# Patient Record
Sex: Female | Born: 1960 | Race: Black or African American | Hispanic: No | Smoking: Never smoker
Health system: Southern US, Community
[De-identification: ages and names within clinical notes are randomized; demographics above are authoritative.]

## PROBLEM LIST (undated history)

## (undated) ENCOUNTER — Emergency Department (HOSPITAL_COMMUNITY): Payer: Medicaid Other

## (undated) DIAGNOSIS — R413 Other amnesia: Secondary | ICD-10-CM

## (undated) DIAGNOSIS — G4730 Sleep apnea, unspecified: Secondary | ICD-10-CM

## (undated) DIAGNOSIS — J189 Pneumonia, unspecified organism: Secondary | ICD-10-CM

## (undated) DIAGNOSIS — D649 Anemia, unspecified: Secondary | ICD-10-CM

## (undated) DIAGNOSIS — I639 Cerebral infarction, unspecified: Secondary | ICD-10-CM

## (undated) DIAGNOSIS — I219 Acute myocardial infarction, unspecified: Secondary | ICD-10-CM

## (undated) DIAGNOSIS — Z992 Dependence on renal dialysis: Secondary | ICD-10-CM

## (undated) DIAGNOSIS — I10 Essential (primary) hypertension: Secondary | ICD-10-CM

## (undated) DIAGNOSIS — T884XXA Failed or difficult intubation, initial encounter: Secondary | ICD-10-CM

## (undated) DIAGNOSIS — I2510 Atherosclerotic heart disease of native coronary artery without angina pectoris: Secondary | ICD-10-CM

## (undated) DIAGNOSIS — I509 Heart failure, unspecified: Secondary | ICD-10-CM

## (undated) DIAGNOSIS — M1990 Unspecified osteoarthritis, unspecified site: Secondary | ICD-10-CM

## (undated) DIAGNOSIS — N186 End stage renal disease: Secondary | ICD-10-CM

## (undated) DIAGNOSIS — K635 Polyp of colon: Secondary | ICD-10-CM

## (undated) DIAGNOSIS — R0600 Dyspnea, unspecified: Secondary | ICD-10-CM

## (undated) DIAGNOSIS — E119 Type 2 diabetes mellitus without complications: Secondary | ICD-10-CM

## (undated) DIAGNOSIS — N289 Disorder of kidney and ureter, unspecified: Secondary | ICD-10-CM

## (undated) DIAGNOSIS — R011 Cardiac murmur, unspecified: Secondary | ICD-10-CM

## (undated) DIAGNOSIS — F32A Depression, unspecified: Secondary | ICD-10-CM

## (undated) HISTORY — DX: Pneumonia, unspecified organism: J18.9

## (undated) HISTORY — DX: Atherosclerotic heart disease of native coronary artery without angina pectoris: I25.10

## (undated) HISTORY — DX: Anemia, unspecified: D64.9

## (undated) HISTORY — DX: Sleep apnea, unspecified: G47.30

## (undated) HISTORY — DX: Cerebral infarction, unspecified: I63.9

## (undated) HISTORY — DX: Polyp of colon: K63.5

## (undated) HISTORY — DX: Unspecified osteoarthritis, unspecified site: M19.90

## (undated) HISTORY — PX: ABDOMINAL HYSTERECTOMY: SHX81

## (undated) HISTORY — DX: Depression, unspecified: F32.A

## (undated) HISTORY — PX: INNER EAR SURGERY: SHX679

## (undated) HISTORY — PX: TONSILLECTOMY: SUR1361

---

## 2012-10-16 DIAGNOSIS — I5030 Unspecified diastolic (congestive) heart failure: Secondary | ICD-10-CM | POA: Insufficient documentation

## 2012-10-16 DIAGNOSIS — I5032 Chronic diastolic (congestive) heart failure: Secondary | ICD-10-CM | POA: Diagnosis present

## 2012-10-16 DIAGNOSIS — I10 Essential (primary) hypertension: Secondary | ICD-10-CM | POA: Insufficient documentation

## 2013-04-11 DIAGNOSIS — G629 Polyneuropathy, unspecified: Secondary | ICD-10-CM | POA: Insufficient documentation

## 2013-07-07 DIAGNOSIS — I2510 Atherosclerotic heart disease of native coronary artery without angina pectoris: Secondary | ICD-10-CM | POA: Insufficient documentation

## 2014-02-23 DIAGNOSIS — G4733 Obstructive sleep apnea (adult) (pediatric): Secondary | ICD-10-CM | POA: Insufficient documentation

## 2014-05-17 DIAGNOSIS — Z531 Procedure and treatment not carried out because of patient's decision for reasons of belief and group pressure: Secondary | ICD-10-CM | POA: Insufficient documentation

## 2014-05-17 DIAGNOSIS — Z789 Other specified health status: Secondary | ICD-10-CM | POA: Insufficient documentation

## 2014-05-17 DIAGNOSIS — IMO0001 Reserved for inherently not codable concepts without codable children: Secondary | ICD-10-CM | POA: Insufficient documentation

## 2015-02-15 DIAGNOSIS — Z794 Long term (current) use of insulin: Secondary | ICD-10-CM | POA: Insufficient documentation

## 2015-02-15 DIAGNOSIS — E1142 Type 2 diabetes mellitus with diabetic polyneuropathy: Secondary | ICD-10-CM | POA: Insufficient documentation

## 2015-04-30 DIAGNOSIS — N1832 Chronic kidney disease, stage 3b: Secondary | ICD-10-CM | POA: Insufficient documentation

## 2015-08-21 DIAGNOSIS — M545 Low back pain, unspecified: Secondary | ICD-10-CM | POA: Insufficient documentation

## 2018-03-03 DIAGNOSIS — H900 Conductive hearing loss, bilateral: Secondary | ICD-10-CM | POA: Insufficient documentation

## 2018-10-02 DIAGNOSIS — M1A9XX1 Chronic gout, unspecified, with tophus (tophi): Secondary | ICD-10-CM | POA: Insufficient documentation

## 2019-02-03 DIAGNOSIS — E113513 Type 2 diabetes mellitus with proliferative diabetic retinopathy with macular edema, bilateral: Secondary | ICD-10-CM | POA: Insufficient documentation

## 2019-04-28 DIAGNOSIS — I5033 Acute on chronic diastolic (congestive) heart failure: Secondary | ICD-10-CM | POA: Diagnosis not present

## 2019-04-28 DIAGNOSIS — I130 Hypertensive heart and chronic kidney disease with heart failure and stage 1 through stage 4 chronic kidney disease, or unspecified chronic kidney disease: Secondary | ICD-10-CM | POA: Diagnosis not present

## 2019-04-28 DIAGNOSIS — N179 Acute kidney failure, unspecified: Secondary | ICD-10-CM | POA: Diagnosis not present

## 2019-04-28 DIAGNOSIS — I5032 Chronic diastolic (congestive) heart failure: Secondary | ICD-10-CM | POA: Diagnosis not present

## 2019-04-28 DIAGNOSIS — E1142 Type 2 diabetes mellitus with diabetic polyneuropathy: Secondary | ICD-10-CM | POA: Diagnosis not present

## 2019-04-28 DIAGNOSIS — Z6841 Body Mass Index (BMI) 40.0 and over, adult: Secondary | ICD-10-CM | POA: Diagnosis not present

## 2019-04-28 DIAGNOSIS — J9601 Acute respiratory failure with hypoxia: Secondary | ICD-10-CM | POA: Diagnosis not present

## 2019-04-28 DIAGNOSIS — R001 Bradycardia, unspecified: Secondary | ICD-10-CM | POA: Diagnosis not present

## 2019-04-28 DIAGNOSIS — K5900 Constipation, unspecified: Secondary | ICD-10-CM | POA: Diagnosis not present

## 2019-04-28 DIAGNOSIS — D509 Iron deficiency anemia, unspecified: Secondary | ICD-10-CM | POA: Diagnosis not present

## 2019-04-28 DIAGNOSIS — E785 Hyperlipidemia, unspecified: Secondary | ICD-10-CM | POA: Diagnosis not present

## 2019-04-28 DIAGNOSIS — N183 Chronic kidney disease, stage 3 unspecified: Secondary | ICD-10-CM | POA: Diagnosis not present

## 2019-04-28 DIAGNOSIS — Z20828 Contact with and (suspected) exposure to other viral communicable diseases: Secondary | ICD-10-CM | POA: Diagnosis not present

## 2019-04-28 DIAGNOSIS — F329 Major depressive disorder, single episode, unspecified: Secondary | ICD-10-CM | POA: Diagnosis not present

## 2019-04-28 DIAGNOSIS — D631 Anemia in chronic kidney disease: Secondary | ICD-10-CM | POA: Diagnosis not present

## 2019-04-28 DIAGNOSIS — Z7982 Long term (current) use of aspirin: Secondary | ICD-10-CM | POA: Diagnosis not present

## 2019-04-28 DIAGNOSIS — I272 Pulmonary hypertension, unspecified: Secondary | ICD-10-CM | POA: Diagnosis not present

## 2019-04-28 DIAGNOSIS — I2510 Atherosclerotic heart disease of native coronary artery without angina pectoris: Secondary | ICD-10-CM | POA: Diagnosis not present

## 2019-04-28 DIAGNOSIS — E1122 Type 2 diabetes mellitus with diabetic chronic kidney disease: Secondary | ICD-10-CM | POA: Diagnosis not present

## 2019-04-28 DIAGNOSIS — G4733 Obstructive sleep apnea (adult) (pediatric): Secondary | ICD-10-CM | POA: Diagnosis not present

## 2019-04-28 DIAGNOSIS — F5104 Psychophysiologic insomnia: Secondary | ICD-10-CM | POA: Diagnosis not present

## 2019-04-28 DIAGNOSIS — E7800 Pure hypercholesterolemia, unspecified: Secondary | ICD-10-CM | POA: Diagnosis not present

## 2019-04-28 DIAGNOSIS — M1A9XX0 Chronic gout, unspecified, without tophus (tophi): Secondary | ICD-10-CM | POA: Diagnosis not present

## 2019-04-28 DIAGNOSIS — Z8701 Personal history of pneumonia (recurrent): Secondary | ICD-10-CM | POA: Diagnosis not present

## 2019-04-28 DIAGNOSIS — Z794 Long term (current) use of insulin: Secondary | ICD-10-CM | POA: Diagnosis not present

## 2019-07-24 DIAGNOSIS — D509 Iron deficiency anemia, unspecified: Secondary | ICD-10-CM | POA: Diagnosis not present

## 2019-07-24 DIAGNOSIS — I2510 Atherosclerotic heart disease of native coronary artery without angina pectoris: Secondary | ICD-10-CM | POA: Diagnosis not present

## 2019-07-24 DIAGNOSIS — Z79899 Other long term (current) drug therapy: Secondary | ICD-10-CM | POA: Diagnosis not present

## 2019-07-24 DIAGNOSIS — E11649 Type 2 diabetes mellitus with hypoglycemia without coma: Secondary | ICD-10-CM | POA: Diagnosis not present

## 2019-07-24 DIAGNOSIS — M1A09X0 Idiopathic chronic gout, multiple sites, without tophus (tophi): Secondary | ICD-10-CM | POA: Diagnosis not present

## 2019-07-24 DIAGNOSIS — E662 Morbid (severe) obesity with alveolar hypoventilation: Secondary | ICD-10-CM | POA: Diagnosis not present

## 2019-07-24 DIAGNOSIS — F329 Major depressive disorder, single episode, unspecified: Secondary | ICD-10-CM | POA: Diagnosis not present

## 2019-07-24 DIAGNOSIS — Z531 Procedure and treatment not carried out because of patient's decision for reasons of belief and group pressure: Secondary | ICD-10-CM | POA: Diagnosis not present

## 2019-07-24 DIAGNOSIS — N183 Chronic kidney disease, stage 3 unspecified: Secondary | ICD-10-CM | POA: Diagnosis not present

## 2019-07-24 DIAGNOSIS — R0789 Other chest pain: Secondary | ICD-10-CM | POA: Diagnosis not present

## 2019-07-24 DIAGNOSIS — E7800 Pure hypercholesterolemia, unspecified: Secondary | ICD-10-CM | POA: Diagnosis not present

## 2019-07-24 DIAGNOSIS — K5909 Other constipation: Secondary | ICD-10-CM | POA: Diagnosis not present

## 2019-07-24 DIAGNOSIS — Z20822 Contact with and (suspected) exposure to covid-19: Secondary | ICD-10-CM | POA: Diagnosis not present

## 2019-07-24 DIAGNOSIS — J189 Pneumonia, unspecified organism: Secondary | ICD-10-CM | POA: Diagnosis not present

## 2019-07-24 DIAGNOSIS — I272 Pulmonary hypertension, unspecified: Secondary | ICD-10-CM | POA: Diagnosis not present

## 2019-07-24 DIAGNOSIS — Z9981 Dependence on supplemental oxygen: Secondary | ICD-10-CM | POA: Diagnosis not present

## 2019-07-24 DIAGNOSIS — Z6841 Body Mass Index (BMI) 40.0 and over, adult: Secondary | ICD-10-CM | POA: Diagnosis not present

## 2019-07-24 DIAGNOSIS — E1122 Type 2 diabetes mellitus with diabetic chronic kidney disease: Secondary | ICD-10-CM | POA: Diagnosis not present

## 2019-07-24 DIAGNOSIS — Z87728 Personal history of other specified (corrected) congenital malformations of nervous system and sense organs: Secondary | ICD-10-CM | POA: Diagnosis not present

## 2019-07-24 DIAGNOSIS — I5033 Acute on chronic diastolic (congestive) heart failure: Secondary | ICD-10-CM | POA: Diagnosis not present

## 2019-07-24 DIAGNOSIS — I252 Old myocardial infarction: Secondary | ICD-10-CM | POA: Diagnosis not present

## 2019-07-24 DIAGNOSIS — E1142 Type 2 diabetes mellitus with diabetic polyneuropathy: Secondary | ICD-10-CM | POA: Diagnosis not present

## 2019-07-24 DIAGNOSIS — Z8673 Personal history of transient ischemic attack (TIA), and cerebral infarction without residual deficits: Secondary | ICD-10-CM | POA: Diagnosis not present

## 2019-07-24 DIAGNOSIS — I130 Hypertensive heart and chronic kidney disease with heart failure and stage 1 through stage 4 chronic kidney disease, or unspecified chronic kidney disease: Secondary | ICD-10-CM | POA: Diagnosis not present

## 2019-07-24 DIAGNOSIS — J9621 Acute and chronic respiratory failure with hypoxia: Secondary | ICD-10-CM | POA: Diagnosis not present

## 2019-07-24 DIAGNOSIS — Z794 Long term (current) use of insulin: Secondary | ICD-10-CM | POA: Diagnosis not present

## 2019-07-26 DIAGNOSIS — E1140 Type 2 diabetes mellitus with diabetic neuropathy, unspecified: Secondary | ICD-10-CM | POA: Insufficient documentation

## 2019-10-05 DIAGNOSIS — Z23 Encounter for immunization: Secondary | ICD-10-CM | POA: Diagnosis not present

## 2019-11-02 DIAGNOSIS — Z23 Encounter for immunization: Secondary | ICD-10-CM | POA: Diagnosis not present

## 2019-11-14 DIAGNOSIS — J9601 Acute respiratory failure with hypoxia: Secondary | ICD-10-CM | POA: Diagnosis not present

## 2019-11-30 DIAGNOSIS — I10 Essential (primary) hypertension: Secondary | ICD-10-CM | POA: Diagnosis not present

## 2019-11-30 DIAGNOSIS — N183 Chronic kidney disease, stage 3 unspecified: Secondary | ICD-10-CM | POA: Diagnosis not present

## 2019-11-30 DIAGNOSIS — E790 Hyperuricemia without signs of inflammatory arthritis and tophaceous disease: Secondary | ICD-10-CM | POA: Diagnosis not present

## 2019-12-04 DIAGNOSIS — H4311 Vitreous hemorrhage, right eye: Secondary | ICD-10-CM | POA: Insufficient documentation

## 2019-12-04 DIAGNOSIS — E113513 Type 2 diabetes mellitus with proliferative diabetic retinopathy with macular edema, bilateral: Secondary | ICD-10-CM | POA: Diagnosis not present

## 2019-12-06 DIAGNOSIS — E113513 Type 2 diabetes mellitus with proliferative diabetic retinopathy with macular edema, bilateral: Secondary | ICD-10-CM | POA: Diagnosis not present

## 2019-12-07 DIAGNOSIS — I25118 Atherosclerotic heart disease of native coronary artery with other forms of angina pectoris: Secondary | ICD-10-CM | POA: Diagnosis not present

## 2019-12-07 DIAGNOSIS — G4733 Obstructive sleep apnea (adult) (pediatric): Secondary | ICD-10-CM | POA: Diagnosis not present

## 2019-12-07 DIAGNOSIS — I5032 Chronic diastolic (congestive) heart failure: Secondary | ICD-10-CM | POA: Diagnosis not present

## 2019-12-07 DIAGNOSIS — I10 Essential (primary) hypertension: Secondary | ICD-10-CM | POA: Diagnosis not present

## 2019-12-07 DIAGNOSIS — N183 Chronic kidney disease, stage 3 unspecified: Secondary | ICD-10-CM | POA: Diagnosis not present

## 2019-12-07 DIAGNOSIS — I2510 Atherosclerotic heart disease of native coronary artery without angina pectoris: Secondary | ICD-10-CM | POA: Diagnosis not present

## 2019-12-13 DIAGNOSIS — E1165 Type 2 diabetes mellitus with hyperglycemia: Secondary | ICD-10-CM | POA: Diagnosis not present

## 2019-12-13 DIAGNOSIS — L668 Other cicatricial alopecia: Secondary | ICD-10-CM | POA: Diagnosis not present

## 2019-12-13 DIAGNOSIS — E662 Morbid (severe) obesity with alveolar hypoventilation: Secondary | ICD-10-CM | POA: Diagnosis not present

## 2019-12-13 DIAGNOSIS — Z20822 Contact with and (suspected) exposure to covid-19: Secondary | ICD-10-CM | POA: Diagnosis not present

## 2019-12-13 DIAGNOSIS — I517 Cardiomegaly: Secondary | ICD-10-CM | POA: Diagnosis not present

## 2019-12-13 DIAGNOSIS — J811 Chronic pulmonary edema: Secondary | ICD-10-CM | POA: Diagnosis not present

## 2019-12-13 DIAGNOSIS — F5104 Psychophysiologic insomnia: Secondary | ICD-10-CM | POA: Diagnosis not present

## 2019-12-13 DIAGNOSIS — L299 Pruritus, unspecified: Secondary | ICD-10-CM | POA: Diagnosis not present

## 2019-12-13 DIAGNOSIS — I10 Essential (primary) hypertension: Secondary | ICD-10-CM | POA: Diagnosis not present

## 2019-12-13 DIAGNOSIS — Z6841 Body Mass Index (BMI) 40.0 and over, adult: Secondary | ICD-10-CM | POA: Diagnosis not present

## 2019-12-13 DIAGNOSIS — N183 Chronic kidney disease, stage 3 unspecified: Secondary | ICD-10-CM | POA: Diagnosis not present

## 2019-12-13 DIAGNOSIS — N184 Chronic kidney disease, stage 4 (severe): Secondary | ICD-10-CM | POA: Diagnosis not present

## 2019-12-13 DIAGNOSIS — R0789 Other chest pain: Secondary | ICD-10-CM | POA: Diagnosis not present

## 2019-12-13 DIAGNOSIS — E8881 Metabolic syndrome: Secondary | ICD-10-CM | POA: Diagnosis not present

## 2019-12-13 DIAGNOSIS — R079 Chest pain, unspecified: Secondary | ICD-10-CM | POA: Diagnosis not present

## 2019-12-13 DIAGNOSIS — D509 Iron deficiency anemia, unspecified: Secondary | ICD-10-CM | POA: Diagnosis not present

## 2019-12-13 DIAGNOSIS — M1A9XX1 Chronic gout, unspecified, with tophus (tophi): Secondary | ICD-10-CM | POA: Diagnosis not present

## 2019-12-13 DIAGNOSIS — R0602 Shortness of breath: Secondary | ICD-10-CM | POA: Diagnosis not present

## 2019-12-13 DIAGNOSIS — I130 Hypertensive heart and chronic kidney disease with heart failure and stage 1 through stage 4 chronic kidney disease, or unspecified chronic kidney disease: Secondary | ICD-10-CM | POA: Diagnosis not present

## 2019-12-13 DIAGNOSIS — E7800 Pure hypercholesterolemia, unspecified: Secondary | ICD-10-CM | POA: Diagnosis not present

## 2019-12-13 DIAGNOSIS — I5033 Acute on chronic diastolic (congestive) heart failure: Secondary | ICD-10-CM | POA: Diagnosis not present

## 2019-12-13 DIAGNOSIS — I2510 Atherosclerotic heart disease of native coronary artery without angina pectoris: Secondary | ICD-10-CM | POA: Diagnosis not present

## 2019-12-13 DIAGNOSIS — E1122 Type 2 diabetes mellitus with diabetic chronic kidney disease: Secondary | ICD-10-CM | POA: Diagnosis not present

## 2019-12-13 DIAGNOSIS — I493 Ventricular premature depolarization: Secondary | ICD-10-CM | POA: Diagnosis not present

## 2019-12-13 DIAGNOSIS — F329 Major depressive disorder, single episode, unspecified: Secondary | ICD-10-CM | POA: Diagnosis not present

## 2019-12-13 DIAGNOSIS — E1142 Type 2 diabetes mellitus with diabetic polyneuropathy: Secondary | ICD-10-CM | POA: Diagnosis not present

## 2019-12-13 DIAGNOSIS — E113513 Type 2 diabetes mellitus with proliferative diabetic retinopathy with macular edema, bilateral: Secondary | ICD-10-CM | POA: Diagnosis not present

## 2019-12-13 DIAGNOSIS — G8929 Other chronic pain: Secondary | ICD-10-CM | POA: Diagnosis not present

## 2019-12-13 DIAGNOSIS — K5900 Constipation, unspecified: Secondary | ICD-10-CM | POA: Diagnosis not present

## 2019-12-13 DIAGNOSIS — E785 Hyperlipidemia, unspecified: Secondary | ICD-10-CM | POA: Diagnosis not present

## 2019-12-13 DIAGNOSIS — I440 Atrioventricular block, first degree: Secondary | ICD-10-CM | POA: Diagnosis not present

## 2019-12-13 DIAGNOSIS — R9431 Abnormal electrocardiogram [ECG] [EKG]: Secondary | ICD-10-CM | POA: Diagnosis not present

## 2019-12-13 DIAGNOSIS — M545 Low back pain: Secondary | ICD-10-CM | POA: Diagnosis not present

## 2019-12-13 DIAGNOSIS — F419 Anxiety disorder, unspecified: Secondary | ICD-10-CM | POA: Diagnosis not present

## 2019-12-13 DIAGNOSIS — E11649 Type 2 diabetes mellitus with hypoglycemia without coma: Secondary | ICD-10-CM | POA: Diagnosis not present

## 2019-12-14 DIAGNOSIS — D509 Iron deficiency anemia, unspecified: Secondary | ICD-10-CM | POA: Insufficient documentation

## 2019-12-14 DIAGNOSIS — L905 Scar conditions and fibrosis of skin: Secondary | ICD-10-CM | POA: Diagnosis not present

## 2019-12-15 DIAGNOSIS — I10 Essential (primary) hypertension: Secondary | ICD-10-CM | POA: Diagnosis not present

## 2019-12-15 DIAGNOSIS — D509 Iron deficiency anemia, unspecified: Secondary | ICD-10-CM | POA: Diagnosis not present

## 2019-12-15 DIAGNOSIS — N183 Chronic kidney disease, stage 3 unspecified: Secondary | ICD-10-CM | POA: Diagnosis not present

## 2019-12-15 DIAGNOSIS — I5033 Acute on chronic diastolic (congestive) heart failure: Secondary | ICD-10-CM | POA: Diagnosis not present

## 2019-12-16 DIAGNOSIS — I10 Essential (primary) hypertension: Secondary | ICD-10-CM | POA: Diagnosis not present

## 2019-12-16 DIAGNOSIS — I5033 Acute on chronic diastolic (congestive) heart failure: Secondary | ICD-10-CM | POA: Diagnosis not present

## 2019-12-16 DIAGNOSIS — N183 Chronic kidney disease, stage 3 unspecified: Secondary | ICD-10-CM | POA: Diagnosis not present

## 2019-12-16 DIAGNOSIS — D509 Iron deficiency anemia, unspecified: Secondary | ICD-10-CM | POA: Diagnosis not present

## 2019-12-20 DIAGNOSIS — G4733 Obstructive sleep apnea (adult) (pediatric): Secondary | ICD-10-CM | POA: Diagnosis not present

## 2019-12-20 DIAGNOSIS — G4737 Central sleep apnea in conditions classified elsewhere: Secondary | ICD-10-CM | POA: Diagnosis not present

## 2019-12-21 DIAGNOSIS — E1169 Type 2 diabetes mellitus with other specified complication: Secondary | ICD-10-CM | POA: Diagnosis not present

## 2019-12-21 DIAGNOSIS — I25118 Atherosclerotic heart disease of native coronary artery with other forms of angina pectoris: Secondary | ICD-10-CM | POA: Diagnosis not present

## 2019-12-21 DIAGNOSIS — I10 Essential (primary) hypertension: Secondary | ICD-10-CM | POA: Diagnosis not present

## 2019-12-21 DIAGNOSIS — I5032 Chronic diastolic (congestive) heart failure: Secondary | ICD-10-CM | POA: Diagnosis not present

## 2019-12-22 DIAGNOSIS — I25118 Atherosclerotic heart disease of native coronary artery with other forms of angina pectoris: Secondary | ICD-10-CM | POA: Diagnosis not present

## 2019-12-22 DIAGNOSIS — I5032 Chronic diastolic (congestive) heart failure: Secondary | ICD-10-CM | POA: Diagnosis not present

## 2019-12-22 DIAGNOSIS — I10 Essential (primary) hypertension: Secondary | ICD-10-CM | POA: Diagnosis not present

## 2019-12-22 DIAGNOSIS — N184 Chronic kidney disease, stage 4 (severe): Secondary | ICD-10-CM | POA: Diagnosis not present

## 2020-01-05 DIAGNOSIS — I10 Essential (primary) hypertension: Secondary | ICD-10-CM | POA: Diagnosis not present

## 2020-01-11 DIAGNOSIS — G4733 Obstructive sleep apnea (adult) (pediatric): Secondary | ICD-10-CM | POA: Diagnosis not present

## 2020-01-11 DIAGNOSIS — N183 Chronic kidney disease, stage 3 unspecified: Secondary | ICD-10-CM | POA: Diagnosis not present

## 2020-01-11 DIAGNOSIS — G4719 Other hypersomnia: Secondary | ICD-10-CM | POA: Insufficient documentation

## 2020-01-11 DIAGNOSIS — I10 Essential (primary) hypertension: Secondary | ICD-10-CM | POA: Diagnosis not present

## 2020-01-11 DIAGNOSIS — G4700 Insomnia, unspecified: Secondary | ICD-10-CM | POA: Diagnosis not present

## 2020-01-26 DIAGNOSIS — I5032 Chronic diastolic (congestive) heart failure: Secondary | ICD-10-CM | POA: Diagnosis not present

## 2020-01-26 DIAGNOSIS — N1832 Chronic kidney disease, stage 3b: Secondary | ICD-10-CM | POA: Diagnosis not present

## 2020-01-29 DIAGNOSIS — E1165 Type 2 diabetes mellitus with hyperglycemia: Secondary | ICD-10-CM | POA: Diagnosis not present

## 2020-01-29 DIAGNOSIS — E113513 Type 2 diabetes mellitus with proliferative diabetic retinopathy with macular edema, bilateral: Secondary | ICD-10-CM | POA: Diagnosis not present

## 2020-01-29 DIAGNOSIS — Z794 Long term (current) use of insulin: Secondary | ICD-10-CM | POA: Diagnosis not present

## 2020-02-05 DIAGNOSIS — Z1231 Encounter for screening mammogram for malignant neoplasm of breast: Secondary | ICD-10-CM | POA: Diagnosis not present

## 2020-03-11 DIAGNOSIS — H4311 Vitreous hemorrhage, right eye: Secondary | ICD-10-CM | POA: Diagnosis not present

## 2020-03-11 DIAGNOSIS — E113513 Type 2 diabetes mellitus with proliferative diabetic retinopathy with macular edema, bilateral: Secondary | ICD-10-CM | POA: Diagnosis not present

## 2020-03-13 DIAGNOSIS — E113513 Type 2 diabetes mellitus with proliferative diabetic retinopathy with macular edema, bilateral: Secondary | ICD-10-CM | POA: Diagnosis not present

## 2020-03-13 DIAGNOSIS — N183 Chronic kidney disease, stage 3 unspecified: Secondary | ICD-10-CM | POA: Diagnosis not present

## 2020-03-15 DIAGNOSIS — M1A0621 Idiopathic chronic gout, left knee, with tophus (tophi): Secondary | ICD-10-CM | POA: Diagnosis not present

## 2020-03-15 DIAGNOSIS — I5030 Unspecified diastolic (congestive) heart failure: Secondary | ICD-10-CM | POA: Diagnosis not present

## 2020-03-15 DIAGNOSIS — E1142 Type 2 diabetes mellitus with diabetic polyneuropathy: Secondary | ICD-10-CM | POA: Diagnosis not present

## 2020-03-15 DIAGNOSIS — Z794 Long term (current) use of insulin: Secondary | ICD-10-CM | POA: Diagnosis not present

## 2020-04-12 DIAGNOSIS — H359 Unspecified retinal disorder: Secondary | ICD-10-CM | POA: Diagnosis not present

## 2020-04-12 DIAGNOSIS — E113513 Type 2 diabetes mellitus with proliferative diabetic retinopathy with macular edema, bilateral: Secondary | ICD-10-CM | POA: Diagnosis not present

## 2020-04-16 DIAGNOSIS — I2510 Atherosclerotic heart disease of native coronary artery without angina pectoris: Secondary | ICD-10-CM | POA: Diagnosis not present

## 2020-04-16 DIAGNOSIS — N1832 Chronic kidney disease, stage 3b: Secondary | ICD-10-CM | POA: Diagnosis not present

## 2020-04-16 DIAGNOSIS — G4700 Insomnia, unspecified: Secondary | ICD-10-CM | POA: Diagnosis not present

## 2020-04-16 DIAGNOSIS — Z79899 Other long term (current) drug therapy: Secondary | ICD-10-CM | POA: Diagnosis not present

## 2020-04-16 DIAGNOSIS — I130 Hypertensive heart and chronic kidney disease with heart failure and stage 1 through stage 4 chronic kidney disease, or unspecified chronic kidney disease: Secondary | ICD-10-CM | POA: Diagnosis not present

## 2020-04-16 DIAGNOSIS — G4733 Obstructive sleep apnea (adult) (pediatric): Secondary | ICD-10-CM | POA: Diagnosis not present

## 2020-04-16 DIAGNOSIS — I5033 Acute on chronic diastolic (congestive) heart failure: Secondary | ICD-10-CM | POA: Diagnosis not present

## 2020-04-16 DIAGNOSIS — E1122 Type 2 diabetes mellitus with diabetic chronic kidney disease: Secondary | ICD-10-CM | POA: Diagnosis not present

## 2020-04-16 DIAGNOSIS — G4719 Other hypersomnia: Secondary | ICD-10-CM | POA: Diagnosis not present

## 2020-04-18 DIAGNOSIS — E790 Hyperuricemia without signs of inflammatory arthritis and tophaceous disease: Secondary | ICD-10-CM | POA: Diagnosis not present

## 2020-04-18 DIAGNOSIS — N1832 Chronic kidney disease, stage 3b: Secondary | ICD-10-CM | POA: Diagnosis not present

## 2020-04-18 DIAGNOSIS — Z23 Encounter for immunization: Secondary | ICD-10-CM | POA: Diagnosis not present

## 2020-04-18 DIAGNOSIS — I10 Essential (primary) hypertension: Secondary | ICD-10-CM | POA: Diagnosis not present

## 2020-04-29 DIAGNOSIS — E1121 Type 2 diabetes mellitus with diabetic nephropathy: Secondary | ICD-10-CM | POA: Diagnosis not present

## 2020-04-29 DIAGNOSIS — Z794 Long term (current) use of insulin: Secondary | ICD-10-CM | POA: Diagnosis not present

## 2020-04-29 DIAGNOSIS — E1165 Type 2 diabetes mellitus with hyperglycemia: Secondary | ICD-10-CM | POA: Diagnosis not present

## 2020-04-29 DIAGNOSIS — E162 Hypoglycemia, unspecified: Secondary | ICD-10-CM | POA: Diagnosis not present

## 2020-05-27 DIAGNOSIS — I130 Hypertensive heart and chronic kidney disease with heart failure and stage 1 through stage 4 chronic kidney disease, or unspecified chronic kidney disease: Secondary | ICD-10-CM | POA: Diagnosis not present

## 2020-05-27 DIAGNOSIS — Z794 Long term (current) use of insulin: Secondary | ICD-10-CM | POA: Diagnosis not present

## 2020-05-27 DIAGNOSIS — J811 Chronic pulmonary edema: Secondary | ICD-10-CM | POA: Diagnosis not present

## 2020-05-27 DIAGNOSIS — I517 Cardiomegaly: Secondary | ICD-10-CM | POA: Diagnosis not present

## 2020-05-27 DIAGNOSIS — I4581 Long QT syndrome: Secondary | ICD-10-CM | POA: Diagnosis not present

## 2020-05-27 DIAGNOSIS — I5032 Chronic diastolic (congestive) heart failure: Secondary | ICD-10-CM | POA: Diagnosis not present

## 2020-05-27 DIAGNOSIS — R9431 Abnormal electrocardiogram [ECG] [EKG]: Secondary | ICD-10-CM | POA: Diagnosis not present

## 2020-05-27 DIAGNOSIS — Z5329 Procedure and treatment not carried out because of patient's decision for other reasons: Secondary | ICD-10-CM | POA: Diagnosis not present

## 2020-05-27 DIAGNOSIS — Z79899 Other long term (current) drug therapy: Secondary | ICD-10-CM | POA: Diagnosis not present

## 2020-05-27 DIAGNOSIS — N1832 Chronic kidney disease, stage 3b: Secondary | ICD-10-CM | POA: Diagnosis not present

## 2020-05-27 DIAGNOSIS — R0602 Shortness of breath: Secondary | ICD-10-CM | POA: Diagnosis not present

## 2020-05-27 DIAGNOSIS — I493 Ventricular premature depolarization: Secondary | ICD-10-CM | POA: Diagnosis not present

## 2020-05-27 DIAGNOSIS — Z5181 Encounter for therapeutic drug level monitoring: Secondary | ICD-10-CM | POA: Diagnosis not present

## 2020-05-27 DIAGNOSIS — E1122 Type 2 diabetes mellitus with diabetic chronic kidney disease: Secondary | ICD-10-CM | POA: Diagnosis not present

## 2020-05-27 DIAGNOSIS — R0789 Other chest pain: Secondary | ICD-10-CM | POA: Diagnosis not present

## 2020-05-27 DIAGNOSIS — Z20822 Contact with and (suspected) exposure to covid-19: Secondary | ICD-10-CM | POA: Diagnosis not present

## 2020-06-03 DIAGNOSIS — I10 Essential (primary) hypertension: Secondary | ICD-10-CM | POA: Diagnosis not present

## 2020-06-03 DIAGNOSIS — R0602 Shortness of breath: Secondary | ICD-10-CM | POA: Diagnosis not present

## 2020-06-03 DIAGNOSIS — E113513 Type 2 diabetes mellitus with proliferative diabetic retinopathy with macular edema, bilateral: Secondary | ICD-10-CM | POA: Diagnosis not present

## 2020-06-04 DIAGNOSIS — I517 Cardiomegaly: Secondary | ICD-10-CM | POA: Diagnosis not present

## 2020-06-04 DIAGNOSIS — R0602 Shortness of breath: Secondary | ICD-10-CM | POA: Diagnosis not present

## 2020-06-26 DIAGNOSIS — E1142 Type 2 diabetes mellitus with diabetic polyneuropathy: Secondary | ICD-10-CM | POA: Diagnosis not present

## 2020-06-26 DIAGNOSIS — M1A9XX1 Chronic gout, unspecified, with tophus (tophi): Secondary | ICD-10-CM | POA: Diagnosis not present

## 2020-06-26 DIAGNOSIS — Z794 Long term (current) use of insulin: Secondary | ICD-10-CM | POA: Diagnosis not present

## 2020-06-26 DIAGNOSIS — Z23 Encounter for immunization: Secondary | ICD-10-CM | POA: Diagnosis not present

## 2020-06-26 DIAGNOSIS — I10 Essential (primary) hypertension: Secondary | ICD-10-CM | POA: Diagnosis not present

## 2020-07-04 DIAGNOSIS — H4311 Vitreous hemorrhage, right eye: Secondary | ICD-10-CM | POA: Diagnosis not present

## 2020-07-04 DIAGNOSIS — E113513 Type 2 diabetes mellitus with proliferative diabetic retinopathy with macular edema, bilateral: Secondary | ICD-10-CM | POA: Diagnosis not present

## 2020-07-08 DIAGNOSIS — E1142 Type 2 diabetes mellitus with diabetic polyneuropathy: Secondary | ICD-10-CM | POA: Diagnosis not present

## 2020-07-08 DIAGNOSIS — I10 Essential (primary) hypertension: Secondary | ICD-10-CM | POA: Diagnosis not present

## 2020-07-08 DIAGNOSIS — Z794 Long term (current) use of insulin: Secondary | ICD-10-CM | POA: Diagnosis not present

## 2020-07-16 DIAGNOSIS — Z419 Encounter for procedure for purposes other than remedying health state, unspecified: Secondary | ICD-10-CM | POA: Diagnosis not present

## 2020-07-25 DIAGNOSIS — I5033 Acute on chronic diastolic (congestive) heart failure: Secondary | ICD-10-CM | POA: Diagnosis not present

## 2020-07-25 DIAGNOSIS — I10 Essential (primary) hypertension: Secondary | ICD-10-CM | POA: Diagnosis not present

## 2020-07-25 DIAGNOSIS — D509 Iron deficiency anemia, unspecified: Secondary | ICD-10-CM | POA: Diagnosis not present

## 2020-07-25 DIAGNOSIS — N1832 Chronic kidney disease, stage 3b: Secondary | ICD-10-CM | POA: Diagnosis not present

## 2020-08-05 DIAGNOSIS — L299 Pruritus, unspecified: Secondary | ICD-10-CM | POA: Diagnosis not present

## 2020-08-05 DIAGNOSIS — L659 Nonscarring hair loss, unspecified: Secondary | ICD-10-CM | POA: Diagnosis not present

## 2020-08-06 DIAGNOSIS — H5461 Unqualified visual loss, right eye, normal vision left eye: Secondary | ICD-10-CM | POA: Insufficient documentation

## 2020-08-06 DIAGNOSIS — I2510 Atherosclerotic heart disease of native coronary artery without angina pectoris: Secondary | ICD-10-CM | POA: Diagnosis not present

## 2020-08-06 DIAGNOSIS — I10 Essential (primary) hypertension: Secondary | ICD-10-CM | POA: Diagnosis not present

## 2020-08-06 DIAGNOSIS — I5033 Acute on chronic diastolic (congestive) heart failure: Secondary | ICD-10-CM | POA: Diagnosis not present

## 2020-08-06 DIAGNOSIS — G4733 Obstructive sleep apnea (adult) (pediatric): Secondary | ICD-10-CM | POA: Diagnosis not present

## 2020-08-06 DIAGNOSIS — R0601 Orthopnea: Secondary | ICD-10-CM | POA: Insufficient documentation

## 2020-08-07 DIAGNOSIS — I10 Essential (primary) hypertension: Secondary | ICD-10-CM | POA: Diagnosis not present

## 2020-08-07 DIAGNOSIS — I2510 Atherosclerotic heart disease of native coronary artery without angina pectoris: Secondary | ICD-10-CM | POA: Diagnosis not present

## 2020-08-07 DIAGNOSIS — I5033 Acute on chronic diastolic (congestive) heart failure: Secondary | ICD-10-CM | POA: Diagnosis not present

## 2020-08-07 DIAGNOSIS — E113513 Type 2 diabetes mellitus with proliferative diabetic retinopathy with macular edema, bilateral: Secondary | ICD-10-CM | POA: Diagnosis not present

## 2020-08-07 DIAGNOSIS — I6523 Occlusion and stenosis of bilateral carotid arteries: Secondary | ICD-10-CM | POA: Diagnosis not present

## 2020-08-13 DIAGNOSIS — Z419 Encounter for procedure for purposes other than remedying health state, unspecified: Secondary | ICD-10-CM | POA: Diagnosis not present

## 2020-08-14 DIAGNOSIS — I517 Cardiomegaly: Secondary | ICD-10-CM | POA: Diagnosis not present

## 2020-08-14 DIAGNOSIS — J811 Chronic pulmonary edema: Secondary | ICD-10-CM | POA: Diagnosis not present

## 2020-08-14 DIAGNOSIS — E1142 Type 2 diabetes mellitus with diabetic polyneuropathy: Secondary | ICD-10-CM | POA: Diagnosis not present

## 2020-08-14 DIAGNOSIS — Z0000 Encounter for general adult medical examination without abnormal findings: Secondary | ICD-10-CM | POA: Diagnosis not present

## 2020-08-14 DIAGNOSIS — Z13220 Encounter for screening for lipoid disorders: Secondary | ICD-10-CM | POA: Diagnosis not present

## 2020-08-14 DIAGNOSIS — R918 Other nonspecific abnormal finding of lung field: Secondary | ICD-10-CM | POA: Diagnosis not present

## 2020-08-14 DIAGNOSIS — Z794 Long term (current) use of insulin: Secondary | ICD-10-CM | POA: Diagnosis not present

## 2020-08-14 DIAGNOSIS — N184 Chronic kidney disease, stage 4 (severe): Secondary | ICD-10-CM | POA: Diagnosis not present

## 2020-08-16 DIAGNOSIS — G4733 Obstructive sleep apnea (adult) (pediatric): Secondary | ICD-10-CM | POA: Diagnosis not present

## 2020-08-16 DIAGNOSIS — I2510 Atherosclerotic heart disease of native coronary artery without angina pectoris: Secondary | ICD-10-CM | POA: Diagnosis not present

## 2020-08-16 DIAGNOSIS — I10 Essential (primary) hypertension: Secondary | ICD-10-CM | POA: Diagnosis not present

## 2020-08-16 DIAGNOSIS — I5033 Acute on chronic diastolic (congestive) heart failure: Secondary | ICD-10-CM | POA: Diagnosis not present

## 2020-09-05 DIAGNOSIS — E113513 Type 2 diabetes mellitus with proliferative diabetic retinopathy with macular edema, bilateral: Secondary | ICD-10-CM | POA: Diagnosis not present

## 2020-09-05 DIAGNOSIS — H4311 Vitreous hemorrhage, right eye: Secondary | ICD-10-CM | POA: Diagnosis not present

## 2020-09-13 DIAGNOSIS — Z419 Encounter for procedure for purposes other than remedying health state, unspecified: Secondary | ICD-10-CM | POA: Diagnosis not present

## 2020-09-26 DIAGNOSIS — E113513 Type 2 diabetes mellitus with proliferative diabetic retinopathy with macular edema, bilateral: Secondary | ICD-10-CM | POA: Diagnosis not present

## 2020-10-13 DIAGNOSIS — Z419 Encounter for procedure for purposes other than remedying health state, unspecified: Secondary | ICD-10-CM | POA: Diagnosis not present

## 2020-10-17 DIAGNOSIS — H4311 Vitreous hemorrhage, right eye: Secondary | ICD-10-CM | POA: Diagnosis not present

## 2020-10-17 DIAGNOSIS — E113511 Type 2 diabetes mellitus with proliferative diabetic retinopathy with macular edema, right eye: Secondary | ICD-10-CM | POA: Diagnosis not present

## 2020-10-17 DIAGNOSIS — E113513 Type 2 diabetes mellitus with proliferative diabetic retinopathy with macular edema, bilateral: Secondary | ICD-10-CM | POA: Diagnosis not present

## 2020-10-22 DIAGNOSIS — I110 Hypertensive heart disease with heart failure: Secondary | ICD-10-CM | POA: Diagnosis not present

## 2020-10-22 DIAGNOSIS — Z5181 Encounter for therapeutic drug level monitoring: Secondary | ICD-10-CM | POA: Diagnosis not present

## 2020-10-22 DIAGNOSIS — D649 Anemia, unspecified: Secondary | ICD-10-CM | POA: Diagnosis not present

## 2020-10-22 DIAGNOSIS — I5089 Other heart failure: Secondary | ICD-10-CM | POA: Diagnosis not present

## 2020-10-22 DIAGNOSIS — H4312 Vitreous hemorrhage, left eye: Secondary | ICD-10-CM | POA: Diagnosis not present

## 2020-10-22 DIAGNOSIS — Z79899 Other long term (current) drug therapy: Secondary | ICD-10-CM | POA: Diagnosis not present

## 2020-10-22 DIAGNOSIS — M25511 Pain in right shoulder: Secondary | ICD-10-CM | POA: Diagnosis not present

## 2020-10-22 DIAGNOSIS — R6883 Chills (without fever): Secondary | ICD-10-CM | POA: Diagnosis not present

## 2020-10-24 ENCOUNTER — Telehealth: Payer: Self-pay

## 2020-10-24 NOTE — Telephone Encounter (Signed)
Transition Care Management Unsuccessful Follow-up Telephone Call  Date of discharge and from where:  10/23/20 from [facility]   Attempts:  1st Attempt  Reason for unsuccessful TCM follow-up call:  Missing or invalid number

## 2020-10-25 NOTE — Telephone Encounter (Signed)
Transition Care Management Unsuccessful Follow-up Telephone Call  Date of discharge and from where:  10/23/2020 from [facility]  Attempts:  2nd Attempt  Reason for unsuccessful TCM follow-up call:  Missing or invalid number

## 2020-10-28 NOTE — Telephone Encounter (Signed)
Transition Care Management Unsuccessful Follow-up Telephone Call  Date of discharge and from where:  10/23/2020 from [facility]  Attempts:  3rd Attempt  Reason for unsuccessful TCM follow-up call:  Missing or invalid number

## 2020-10-29 DIAGNOSIS — I2510 Atherosclerotic heart disease of native coronary artery without angina pectoris: Secondary | ICD-10-CM | POA: Diagnosis not present

## 2020-10-29 DIAGNOSIS — I10 Essential (primary) hypertension: Secondary | ICD-10-CM | POA: Diagnosis not present

## 2020-10-29 DIAGNOSIS — I5033 Acute on chronic diastolic (congestive) heart failure: Secondary | ICD-10-CM | POA: Diagnosis not present

## 2020-10-29 DIAGNOSIS — Z23 Encounter for immunization: Secondary | ICD-10-CM | POA: Diagnosis not present

## 2020-10-31 DIAGNOSIS — E113513 Type 2 diabetes mellitus with proliferative diabetic retinopathy with macular edema, bilateral: Secondary | ICD-10-CM | POA: Diagnosis not present

## 2020-11-13 DIAGNOSIS — Z419 Encounter for procedure for purposes other than remedying health state, unspecified: Secondary | ICD-10-CM | POA: Diagnosis not present

## 2020-11-14 DIAGNOSIS — H4312 Vitreous hemorrhage, left eye: Secondary | ICD-10-CM | POA: Diagnosis not present

## 2020-11-14 DIAGNOSIS — E113513 Type 2 diabetes mellitus with proliferative diabetic retinopathy with macular edema, bilateral: Secondary | ICD-10-CM | POA: Diagnosis not present

## 2020-11-14 DIAGNOSIS — H4313 Vitreous hemorrhage, bilateral: Secondary | ICD-10-CM | POA: Diagnosis not present

## 2020-11-14 DIAGNOSIS — H4311 Vitreous hemorrhage, right eye: Secondary | ICD-10-CM | POA: Diagnosis not present

## 2020-11-25 DIAGNOSIS — E113512 Type 2 diabetes mellitus with proliferative diabetic retinopathy with macular edema, left eye: Secondary | ICD-10-CM | POA: Diagnosis not present

## 2020-11-25 DIAGNOSIS — E113591 Type 2 diabetes mellitus with proliferative diabetic retinopathy without macular edema, right eye: Secondary | ICD-10-CM | POA: Diagnosis not present

## 2020-11-25 DIAGNOSIS — H4312 Vitreous hemorrhage, left eye: Secondary | ICD-10-CM | POA: Diagnosis not present

## 2020-11-25 DIAGNOSIS — H3582 Retinal ischemia: Secondary | ICD-10-CM | POA: Diagnosis not present

## 2020-11-27 DIAGNOSIS — N1832 Chronic kidney disease, stage 3b: Secondary | ICD-10-CM | POA: Diagnosis not present

## 2020-11-27 DIAGNOSIS — E1121 Type 2 diabetes mellitus with diabetic nephropathy: Secondary | ICD-10-CM | POA: Diagnosis not present

## 2020-11-27 DIAGNOSIS — Z794 Long term (current) use of insulin: Secondary | ICD-10-CM | POA: Diagnosis not present

## 2020-12-02 DIAGNOSIS — E782 Mixed hyperlipidemia: Secondary | ICD-10-CM | POA: Diagnosis not present

## 2020-12-02 DIAGNOSIS — R635 Abnormal weight gain: Secondary | ICD-10-CM | POA: Diagnosis not present

## 2020-12-02 DIAGNOSIS — E669 Obesity, unspecified: Secondary | ICD-10-CM | POA: Diagnosis not present

## 2020-12-02 DIAGNOSIS — E1169 Type 2 diabetes mellitus with other specified complication: Secondary | ICD-10-CM | POA: Diagnosis not present

## 2020-12-02 DIAGNOSIS — I1310 Hypertensive heart and chronic kidney disease without heart failure, with stage 1 through stage 4 chronic kidney disease, or unspecified chronic kidney disease: Secondary | ICD-10-CM | POA: Diagnosis not present

## 2020-12-02 DIAGNOSIS — I10 Essential (primary) hypertension: Secondary | ICD-10-CM | POA: Diagnosis not present

## 2020-12-02 DIAGNOSIS — E1122 Type 2 diabetes mellitus with diabetic chronic kidney disease: Secondary | ICD-10-CM | POA: Diagnosis not present

## 2020-12-02 DIAGNOSIS — M1000 Idiopathic gout, unspecified site: Secondary | ICD-10-CM | POA: Diagnosis not present

## 2020-12-02 DIAGNOSIS — N990 Postprocedural (acute) (chronic) kidney failure: Secondary | ICD-10-CM | POA: Diagnosis not present

## 2020-12-02 DIAGNOSIS — N189 Chronic kidney disease, unspecified: Secondary | ICD-10-CM | POA: Diagnosis not present

## 2020-12-06 DIAGNOSIS — E1122 Type 2 diabetes mellitus with diabetic chronic kidney disease: Secondary | ICD-10-CM | POA: Diagnosis not present

## 2020-12-06 DIAGNOSIS — I1310 Hypertensive heart and chronic kidney disease without heart failure, with stage 1 through stage 4 chronic kidney disease, or unspecified chronic kidney disease: Secondary | ICD-10-CM | POA: Diagnosis not present

## 2020-12-06 DIAGNOSIS — N189 Chronic kidney disease, unspecified: Secondary | ICD-10-CM | POA: Diagnosis not present

## 2020-12-06 DIAGNOSIS — E1169 Type 2 diabetes mellitus with other specified complication: Secondary | ICD-10-CM | POA: Diagnosis not present

## 2020-12-12 DIAGNOSIS — E113591 Type 2 diabetes mellitus with proliferative diabetic retinopathy without macular edema, right eye: Secondary | ICD-10-CM | POA: Diagnosis not present

## 2020-12-13 DIAGNOSIS — Z419 Encounter for procedure for purposes other than remedying health state, unspecified: Secondary | ICD-10-CM | POA: Diagnosis not present

## 2020-12-23 DIAGNOSIS — Z6841 Body Mass Index (BMI) 40.0 and over, adult: Secondary | ICD-10-CM | POA: Diagnosis not present

## 2020-12-23 DIAGNOSIS — E1122 Type 2 diabetes mellitus with diabetic chronic kidney disease: Secondary | ICD-10-CM | POA: Diagnosis not present

## 2020-12-23 DIAGNOSIS — I1310 Hypertensive heart and chronic kidney disease without heart failure, with stage 1 through stage 4 chronic kidney disease, or unspecified chronic kidney disease: Secondary | ICD-10-CM | POA: Diagnosis not present

## 2020-12-23 DIAGNOSIS — M1000 Idiopathic gout, unspecified site: Secondary | ICD-10-CM | POA: Diagnosis not present

## 2020-12-23 DIAGNOSIS — K5900 Constipation, unspecified: Secondary | ICD-10-CM | POA: Diagnosis not present

## 2020-12-23 DIAGNOSIS — N184 Chronic kidney disease, stage 4 (severe): Secondary | ICD-10-CM | POA: Diagnosis not present

## 2020-12-23 DIAGNOSIS — E1169 Type 2 diabetes mellitus with other specified complication: Secondary | ICD-10-CM | POA: Diagnosis not present

## 2021-01-07 DIAGNOSIS — K5900 Constipation, unspecified: Secondary | ICD-10-CM | POA: Diagnosis not present

## 2021-01-07 DIAGNOSIS — N184 Chronic kidney disease, stage 4 (severe): Secondary | ICD-10-CM | POA: Diagnosis not present

## 2021-01-07 DIAGNOSIS — I1310 Hypertensive heart and chronic kidney disease without heart failure, with stage 1 through stage 4 chronic kidney disease, or unspecified chronic kidney disease: Secondary | ICD-10-CM | POA: Diagnosis not present

## 2021-01-07 DIAGNOSIS — Z6838 Body mass index (BMI) 38.0-38.9, adult: Secondary | ICD-10-CM | POA: Diagnosis not present

## 2021-01-13 DIAGNOSIS — Z419 Encounter for procedure for purposes other than remedying health state, unspecified: Secondary | ICD-10-CM | POA: Diagnosis not present

## 2021-01-17 DIAGNOSIS — G4737 Central sleep apnea in conditions classified elsewhere: Secondary | ICD-10-CM | POA: Diagnosis not present

## 2021-01-17 DIAGNOSIS — G4733 Obstructive sleep apnea (adult) (pediatric): Secondary | ICD-10-CM | POA: Diagnosis not present

## 2021-02-03 DIAGNOSIS — E113592 Type 2 diabetes mellitus with proliferative diabetic retinopathy without macular edema, left eye: Secondary | ICD-10-CM | POA: Diagnosis not present

## 2021-02-03 DIAGNOSIS — H4313 Vitreous hemorrhage, bilateral: Secondary | ICD-10-CM | POA: Diagnosis not present

## 2021-02-03 DIAGNOSIS — H35371 Puckering of macula, right eye: Secondary | ICD-10-CM | POA: Diagnosis not present

## 2021-02-03 DIAGNOSIS — E113511 Type 2 diabetes mellitus with proliferative diabetic retinopathy with macular edema, right eye: Secondary | ICD-10-CM | POA: Diagnosis not present

## 2021-02-04 ENCOUNTER — Encounter (HOSPITAL_COMMUNITY): Payer: Medicaid Other

## 2021-02-06 DIAGNOSIS — E113592 Type 2 diabetes mellitus with proliferative diabetic retinopathy without macular edema, left eye: Secondary | ICD-10-CM | POA: Diagnosis not present

## 2021-02-06 DIAGNOSIS — E113511 Type 2 diabetes mellitus with proliferative diabetic retinopathy with macular edema, right eye: Secondary | ICD-10-CM | POA: Diagnosis not present

## 2021-02-06 DIAGNOSIS — H4312 Vitreous hemorrhage, left eye: Secondary | ICD-10-CM | POA: Diagnosis not present

## 2021-02-06 DIAGNOSIS — H35033 Hypertensive retinopathy, bilateral: Secondary | ICD-10-CM | POA: Diagnosis not present

## 2021-02-13 DIAGNOSIS — Z419 Encounter for procedure for purposes other than remedying health state, unspecified: Secondary | ICD-10-CM | POA: Diagnosis not present

## 2021-02-24 DIAGNOSIS — E113592 Type 2 diabetes mellitus with proliferative diabetic retinopathy without macular edema, left eye: Secondary | ICD-10-CM | POA: Diagnosis not present

## 2021-02-27 DIAGNOSIS — Z23 Encounter for immunization: Secondary | ICD-10-CM | POA: Diagnosis not present

## 2021-02-27 DIAGNOSIS — Z794 Long term (current) use of insulin: Secondary | ICD-10-CM | POA: Diagnosis not present

## 2021-02-27 DIAGNOSIS — I10 Essential (primary) hypertension: Secondary | ICD-10-CM | POA: Diagnosis not present

## 2021-02-27 DIAGNOSIS — E1142 Type 2 diabetes mellitus with diabetic polyneuropathy: Secondary | ICD-10-CM | POA: Diagnosis not present

## 2021-02-27 DIAGNOSIS — N1832 Chronic kidney disease, stage 3b: Secondary | ICD-10-CM | POA: Diagnosis not present

## 2021-03-04 ENCOUNTER — Emergency Department (HOSPITAL_COMMUNITY)
Admission: EM | Admit: 2021-03-04 | Discharge: 2021-03-04 | Disposition: A | Discharge status: Home / Self Care | Payer: Medicaid Other | Attending: Emergency Medicine | Admitting: Emergency Medicine

## 2021-03-04 ENCOUNTER — Other Ambulatory Visit: Payer: Self-pay

## 2021-03-04 ENCOUNTER — Encounter (HOSPITAL_COMMUNITY): Payer: Self-pay | Admitting: Emergency Medicine

## 2021-03-04 ENCOUNTER — Emergency Department (HOSPITAL_COMMUNITY): Payer: Medicaid Other

## 2021-03-04 DIAGNOSIS — N3000 Acute cystitis without hematuria: Secondary | ICD-10-CM | POA: Diagnosis not present

## 2021-03-04 DIAGNOSIS — I130 Hypertensive heart and chronic kidney disease with heart failure and stage 1 through stage 4 chronic kidney disease, or unspecified chronic kidney disease: Secondary | ICD-10-CM | POA: Diagnosis not present

## 2021-03-04 DIAGNOSIS — N189 Chronic kidney disease, unspecified: Secondary | ICD-10-CM | POA: Diagnosis not present

## 2021-03-04 DIAGNOSIS — D72829 Elevated white blood cell count, unspecified: Secondary | ICD-10-CM | POA: Insufficient documentation

## 2021-03-04 DIAGNOSIS — I509 Heart failure, unspecified: Secondary | ICD-10-CM | POA: Insufficient documentation

## 2021-03-04 DIAGNOSIS — R0602 Shortness of breath: Secondary | ICD-10-CM | POA: Diagnosis not present

## 2021-03-04 DIAGNOSIS — M545 Low back pain, unspecified: Secondary | ICD-10-CM | POA: Insufficient documentation

## 2021-03-04 DIAGNOSIS — R300 Dysuria: Secondary | ICD-10-CM | POA: Diagnosis present

## 2021-03-04 DIAGNOSIS — R42 Dizziness and giddiness: Secondary | ICD-10-CM | POA: Diagnosis not present

## 2021-03-04 HISTORY — DX: Cerebral infarction, unspecified: I63.9

## 2021-03-04 HISTORY — DX: Disorder of kidney and ureter, unspecified: N28.9

## 2021-03-04 HISTORY — DX: Essential (primary) hypertension: I10

## 2021-03-04 HISTORY — DX: Heart failure, unspecified: I50.9

## 2021-03-04 LAB — CBC WITH DIFFERENTIAL/PLATELET
Abs Immature Granulocytes: 0.02 10*3/uL (ref 0.00–0.07)
Basophils Absolute: 0.1 10*3/uL (ref 0.0–0.1)
Basophils Relative: 1 %
Eosinophils Absolute: 0.2 10*3/uL (ref 0.0–0.5)
Eosinophils Relative: 3 %
HCT: 38.4 % (ref 36.0–46.0)
Hemoglobin: 12.3 g/dL (ref 12.0–15.0)
Immature Granulocytes: 0 %
Lymphocytes Relative: 14 %
Lymphs Abs: 0.8 10*3/uL (ref 0.7–4.0)
MCH: 26 pg (ref 26.0–34.0)
MCHC: 32 g/dL (ref 30.0–36.0)
MCV: 81.2 fL (ref 80.0–100.0)
Monocytes Absolute: 0.4 10*3/uL (ref 0.1–1.0)
Monocytes Relative: 7 %
Neutro Abs: 4.3 10*3/uL (ref 1.7–7.7)
Neutrophils Relative %: 75 %
Platelets: 254 10*3/uL (ref 150–400)
RBC: 4.73 MIL/uL (ref 3.87–5.11)
RDW: 19.6 % — ABNORMAL HIGH (ref 11.5–15.5)
WBC: 5.7 10*3/uL (ref 4.0–10.5)
nRBC: 0 % (ref 0.0–0.2)

## 2021-03-04 LAB — BASIC METABOLIC PANEL
Anion gap: 12 (ref 5–15)
BUN: 48 mg/dL — ABNORMAL HIGH (ref 6–20)
CO2: 24 mmol/L (ref 22–32)
Calcium: 9.2 mg/dL (ref 8.9–10.3)
Chloride: 101 mmol/L (ref 98–111)
Creatinine, Ser: 2.77 mg/dL — ABNORMAL HIGH (ref 0.44–1.00)
GFR, Estimated: 19 mL/min — ABNORMAL LOW (ref >60)
Glucose, Bld: 122 mg/dL — ABNORMAL HIGH (ref 70–99)
Potassium: 3.7 mmol/L (ref 3.5–5.1)
Sodium: 137 mmol/L (ref 135–145)

## 2021-03-04 LAB — URINALYSIS, ROUTINE W REFLEX MICROSCOPIC
Bilirubin Urine: NEGATIVE
Glucose, UA: NEGATIVE mg/dL
Hgb urine dipstick: NEGATIVE
Ketones, ur: NEGATIVE mg/dL
Nitrite: NEGATIVE
Protein, ur: >300 mg/dL — AB
Specific Gravity, Urine: 1.025 (ref 1.005–1.030)
pH: 5.5 (ref 5.0–8.0)

## 2021-03-04 LAB — URINALYSIS, MICROSCOPIC (REFLEX): WBC, UA: >50 WBC/hpf (ref 0–5)

## 2021-03-04 LAB — TROPONIN I (HIGH SENSITIVITY)
Troponin I (High Sensitivity): 26 ng/L — ABNORMAL HIGH (ref <18)
Troponin I (High Sensitivity): 26 ng/L — ABNORMAL HIGH (ref <18)

## 2021-03-04 LAB — BRAIN NATRIURETIC PEPTIDE: B Natriuretic Peptide: 66.2 pg/mL (ref 0.0–100.0)

## 2021-03-04 MED ORDER — CEPHALEXIN 250 MG PO CAPS
500.0000 mg | ORAL_CAPSULE | Freq: Once | ORAL | Status: AC
  Administered 2021-03-04: 500 mg via ORAL
  Filled 2021-03-04: qty 2

## 2021-03-04 MED ORDER — CEPHALEXIN 500 MG PO CAPS: 500.0000 mg | ORAL_CAPSULE | Freq: Two times a day (BID) | ORAL | 0 refills | Status: DC

## 2021-03-04 NOTE — ED Provider Notes (Signed)
Emergency Medicine Provider Triage Evaluation Note  Maria Hudson , a 60 y.o. female  was evaluated in triage.  Pt complains of back pain, sob on exertion and dysuria. Hx of DMCHF and CKD not dialysis.   Review of Systems  Positive: Back pain SOB, dysuria Negative: CP, hematuria,  Physical Exam  BP 137/78 (BP Location: Left Arm)   Pulse 71   Temp 97.9 F (36.6 C) (Oral)   Resp 17   Ht '5\' 3"'$  (1.6 m)   Wt 109.3 kg   SpO2 98%   BMI 42.69 kg/m  Gen:   Awake, no distress   Resp:  Normal effort  MSK:   Moves extremities without difficulty  Other:  RRR, back tenderness T10-entire lumbar spine  Medical Decision Making  Medically screening exam initiated at 11:32 AM.  Appropriate orders placed.  Maria Hudson was informed that the remainder of the evaluation will be completed by another provider, this initial triage assessment does not replace that evaluation, and the importance of remaining in the ED until their evaluation is complete.     Darliss Ridgel 03/04/21 1135    Wyvonnia Dusky, MD 03/04/21 1332

## 2021-03-04 NOTE — ED Triage Notes (Signed)
Pt reports bilateral lower/mid back pain. Pt also stating her PCP told me "my kidney function is worsening."

## 2021-03-04 NOTE — Discharge Instructions (Addendum)
Take Keflex as prescribed and complete the full course.  Recheck with your doctor if symptoms continue.

## 2021-03-04 NOTE — ED Notes (Signed)
Pt discharged home, verbalized understanding of D/C instructions. Pt denies questions.

## 2021-03-04 NOTE — ED Provider Notes (Signed)
[place] EMERGENCY DEPARTMENT Provider Note   CSN: [id number] Arrival date & time: 03/04/21  1029     History Chief Complaint  Patient presents with   Back Pain    Maria Hudson is a 60 y.o. female.  60 year old female with history of CHF, HTN, CKD, CVA, presents with complaint of pain in her low back for the past few days with dysuria. Denies hematuria, fever, nausea, vomiting.  Also concern for kidney function, informed that her PCP visit 5 days ago that her kidney function has progressively been worsening. No other complaints or concerns.       Past Medical History:  Diagnosis Date   CHF (congestive heart failure) ([place])    Hypertension    Renal disorder    Stroke ([place])     There are no problems to display for this patient.   History reviewed. No pertinent surgical history.   OB History   No obstetric history on file.     No family history on file.     Home Medications Prior to Admission medications   Medication Sig Start Date End Date Taking? Authorizing Provider  cephALEXin (KEFLEX) 500 MG capsule Take 1 capsule (500 mg total) by mouth 2 (two) times daily for 5 days. 03/04/21 03/09/21 Yes Tacy Learn, PA-C    Allergies    Hydrocodone and Oxycodone-acetaminophen  Review of Systems   Review of Systems  Constitutional:  Negative for chills and fever.  Respiratory:  Negative for shortness of breath.   Cardiovascular:  Negative for chest pain.  Gastrointestinal:  Negative for abdominal pain, constipation, diarrhea, nausea and vomiting.  Genitourinary:  Positive for dysuria. Negative for frequency.  Musculoskeletal:  Positive for back pain. Negative for arthralgias and myalgias.  Skin:  Negative for rash and wound.  Allergic/Immunologic: Negative for immunocompromised state.  Neurological:  Negative for weakness.  Psychiatric/Behavioral:  Negative for confusion.   All other systems reviewed and are negative.  Physical  Exam Updated Vital Signs BP (!) 204/85 (BP Location: Left Arm)   Pulse 65   Temp 97.6 F (36.4 C) (Oral)   Resp 16   Ht '5\' 3"'$  (1.6 m)   Wt 109.3 kg   SpO2 100%   BMI 42.69 kg/m   Physical Exam Vitals and nursing note reviewed.  Constitutional:      General: She is not in acute distress.    Appearance: She is well-developed. She is obese. She is not diaphoretic.  HENT:     Head: Normocephalic and atraumatic.  Cardiovascular:     Rate and Rhythm: Normal rate and regular rhythm.     Heart sounds: Normal heart sounds.  Pulmonary:     Effort: Pulmonary effort is normal.     Breath sounds: Normal breath sounds.  Abdominal:     Palpations: Abdomen is soft.     Tenderness: There is no abdominal tenderness. There is no right CVA tenderness or left CVA tenderness.  Musculoskeletal:        General: Tenderness present. No swelling or deformity.       Back:  Skin:    General: Skin is warm and dry.     Findings: No erythema or rash.  Neurological:     Mental Status: She is alert and oriented to person, place, and time.  Psychiatric:        Behavior: Behavior normal.    ED Results / Procedures / Treatments   Labs (all labs ordered are listed, but  only abnormal results are displayed) Labs Reviewed  BASIC METABOLIC PANEL - Abnormal; Notable for the following components:      Result Value   Glucose, Bld 122 (*)    BUN 48 (*)    Creatinine, Ser 2.77 (*)    GFR, Estimated 19 (*)    All other components within normal limits  CBC WITH DIFFERENTIAL/PLATELET - Abnormal; Notable for the following components:   RDW 19.6 (*)    All other components within normal limits  URINALYSIS, ROUTINE W REFLEX MICROSCOPIC - Abnormal; Notable for the following components:   APPearance CLOUDY (*)    Protein, ur >300 (*)    Leukocytes,Ua LARGE (*)    All other components within normal limits  URINALYSIS, MICROSCOPIC (REFLEX) - Abnormal; Notable for the following components:   Bacteria, UA FEW (*)     All other components within normal limits  TROPONIN I (HIGH SENSITIVITY) - Abnormal; Notable for the following components:   Troponin I (High Sensitivity) 26 (*)    All other components within normal limits  TROPONIN I (HIGH SENSITIVITY) - Abnormal; Notable for the following components:   Troponin I (High Sensitivity) 26 (*)    All other components within normal limits  URINE CULTURE  BRAIN NATRIURETIC PEPTIDE    EKG EKG Interpretation  Date/Time:  Tuesday March 04 2021 11:32:16 EDT Ventricular Rate:  76 PR Interval:  216 QRS Duration: 94 QT Interval:  398 QTC Calculation: 447 R Axis:   -30 Text Interpretation: Sinus rhythm with 1st degree A-V block Left axis deviation Moderate voltage criteria for LVH, may be normal variant ( R in aVL , Cornell product ) ST & T wave abnormality, consider lateral ischemia Abnormal ECG Confirmed by Dene Gentry [phone]) on 03/04/2021 8:16:16 PM  Radiology DG Chest 2 View  Result Date: 03/04/2021 CLINICAL DATA:  Shortness of breath. Congestive heart failure. Dizziness. EXAM: CHEST - 2 VIEW COMPARISON:  None. FINDINGS: Heart size is normal. Mediastinal shadows are normal. Interstitial lung markings are prominent which could relate to chronic lung disease or mild interstitial edema. No effusions. No acute bone finding. IMPRESSION: Abnormal prominent interstitial markings which could be due to chronic interstitial lung disease or mild interstitial edema. Electronically Signed   By: Nelson Chimes M.D.   On: 03/04/2021 12:23    Procedures Procedures   Medications Ordered in ED Medications  cephALEXin (KEFLEX) capsule 500 mg (has no administration in time range)    ED Course  I have reviewed the triage vital signs and the nursing notes.  Pertinent labs & imaging results that were available during my care of the patient were reviewed by me and considered in my medical decision making (see chart for details).  Clinical Course as of 03/04/21 2254   Tue Mar 04, 6049  5179 60 year old female with complaint of low back pain for the past 3 days or so with dysuria.  Recently seen by PCP, concern for worsening kidney function.  Denies fevers, chills, vomiting or abdominal pain.  On exam, no CVA tenderness, abdomen is soft and nontender.  Patient's blood pressure on last recheck 204/85, previous to this had been better controlled, suspect due to prolonged wait in the ER and has not taken her meds, has poorly controlled blood pressure at baseline.  Labs reviewed, CBC with normal white blood cell count, BNP normal, initial troponin is 26, unchanged on repeat.  BMP with creatinine of 2.77, BUN of 48, compared to prior, last obtained on 02/27/2021 with creatinine of  2.8 and BUN of 33.  No significant change.  Urinalysis is positive for protein and leukocytes with bacteria and white blood cells.  We will plan to treat with Keflex, recommend follow-up with PCP. [LM]    Clinical Course User Index [LM] Roque Lias   MDM Rules/Calculators/A&P                           Final Clinical Impression(s) / ED Diagnoses Final diagnoses:  Acute cystitis without hematuria    Rx / DC Orders ED Discharge Orders          Ordered    cephALEXin (KEFLEX) 500 MG capsule  2 times daily        03/04/21 2241             Tacy Learn, PA-C 03/04/21 2254    Valarie Merino, MD 03/06/21 [phone]

## 2021-03-05 ENCOUNTER — Observation Stay (HOSPITAL_COMMUNITY)
Admission: EM | Admit: 2021-03-05 | Discharge: 2021-03-07 | Disposition: A | Discharge status: Home / Self Care | Payer: Medicaid Other | Attending: Internal Medicine | Admitting: Internal Medicine

## 2021-03-05 ENCOUNTER — Telehealth: Payer: Self-pay

## 2021-03-05 ENCOUNTER — Encounter (HOSPITAL_COMMUNITY): Payer: Self-pay

## 2021-03-05 ENCOUNTER — Emergency Department (HOSPITAL_COMMUNITY): Payer: Medicaid Other

## 2021-03-05 ENCOUNTER — Other Ambulatory Visit: Payer: Self-pay

## 2021-03-05 DIAGNOSIS — Z885 Allergy status to narcotic agent status: Secondary | ICD-10-CM

## 2021-03-05 DIAGNOSIS — Z794 Long term (current) use of insulin: Secondary | ICD-10-CM | POA: Diagnosis not present

## 2021-03-05 DIAGNOSIS — E119 Type 2 diabetes mellitus without complications: Secondary | ICD-10-CM | POA: Diagnosis not present

## 2021-03-05 DIAGNOSIS — N184 Chronic kidney disease, stage 4 (severe): Secondary | ICD-10-CM

## 2021-03-05 DIAGNOSIS — N390 Urinary tract infection, site not specified: Secondary | ICD-10-CM | POA: Insufficient documentation

## 2021-03-05 DIAGNOSIS — Z20822 Contact with and (suspected) exposure to covid-19: Secondary | ICD-10-CM | POA: Insufficient documentation

## 2021-03-05 DIAGNOSIS — D649 Anemia, unspecified: Secondary | ICD-10-CM | POA: Insufficient documentation

## 2021-03-05 DIAGNOSIS — I5033 Acute on chronic diastolic (congestive) heart failure: Secondary | ICD-10-CM

## 2021-03-05 DIAGNOSIS — I639 Cerebral infarction, unspecified: Secondary | ICD-10-CM | POA: Diagnosis not present

## 2021-03-05 DIAGNOSIS — E669 Obesity, unspecified: Secondary | ICD-10-CM

## 2021-03-05 DIAGNOSIS — R079 Chest pain, unspecified: Secondary | ICD-10-CM | POA: Diagnosis not present

## 2021-03-05 DIAGNOSIS — Z743 Need for continuous supervision: Secondary | ICD-10-CM | POA: Diagnosis not present

## 2021-03-05 DIAGNOSIS — E872 Acidosis: Secondary | ICD-10-CM | POA: Diagnosis present

## 2021-03-05 DIAGNOSIS — R6889 Other general symptoms and signs: Secondary | ICD-10-CM | POA: Diagnosis not present

## 2021-03-05 DIAGNOSIS — I1A0 Resistant hypertension: Secondary | ICD-10-CM

## 2021-03-05 DIAGNOSIS — R0600 Dyspnea, unspecified: Secondary | ICD-10-CM | POA: Diagnosis not present

## 2021-03-05 DIAGNOSIS — E66812 Obesity, class 2: Secondary | ICD-10-CM

## 2021-03-05 DIAGNOSIS — I5032 Chronic diastolic (congestive) heart failure: Secondary | ICD-10-CM | POA: Diagnosis present

## 2021-03-05 DIAGNOSIS — Z6841 Body Mass Index (BMI) 40.0 and over, adult: Secondary | ICD-10-CM

## 2021-03-05 DIAGNOSIS — I2510 Atherosclerotic heart disease of native coronary artery without angina pectoris: Secondary | ICD-10-CM | POA: Diagnosis not present

## 2021-03-05 DIAGNOSIS — I10 Essential (primary) hypertension: Secondary | ICD-10-CM

## 2021-03-05 DIAGNOSIS — I130 Hypertensive heart and chronic kidney disease with heart failure and stage 1 through stage 4 chronic kidney disease, or unspecified chronic kidney disease: Secondary | ICD-10-CM | POA: Diagnosis not present

## 2021-03-05 DIAGNOSIS — D509 Iron deficiency anemia, unspecified: Secondary | ICD-10-CM | POA: Diagnosis present

## 2021-03-05 DIAGNOSIS — E1122 Type 2 diabetes mellitus with diabetic chronic kidney disease: Secondary | ICD-10-CM | POA: Diagnosis present

## 2021-03-05 DIAGNOSIS — N183 Chronic kidney disease, stage 3 unspecified: Secondary | ICD-10-CM

## 2021-03-05 DIAGNOSIS — I5030 Unspecified diastolic (congestive) heart failure: Secondary | ICD-10-CM | POA: Insufficient documentation

## 2021-03-05 DIAGNOSIS — Z79899 Other long term (current) drug therapy: Secondary | ICD-10-CM

## 2021-03-05 DIAGNOSIS — E785 Hyperlipidemia, unspecified: Secondary | ICD-10-CM | POA: Diagnosis present

## 2021-03-05 DIAGNOSIS — N179 Acute kidney failure, unspecified: Secondary | ICD-10-CM

## 2021-03-05 DIAGNOSIS — N1832 Chronic kidney disease, stage 3b: Secondary | ICD-10-CM | POA: Insufficient documentation

## 2021-03-05 DIAGNOSIS — R0902 Hypoxemia: Secondary | ICD-10-CM | POA: Diagnosis not present

## 2021-03-05 DIAGNOSIS — E871 Hypo-osmolality and hyponatremia: Secondary | ICD-10-CM | POA: Diagnosis present

## 2021-03-05 DIAGNOSIS — F32A Depression, unspecified: Secondary | ICD-10-CM | POA: Diagnosis present

## 2021-03-05 DIAGNOSIS — R0789 Other chest pain: Secondary | ICD-10-CM | POA: Diagnosis not present

## 2021-03-05 DIAGNOSIS — I2583 Coronary atherosclerosis due to lipid rich plaque: Secondary | ICD-10-CM | POA: Diagnosis not present

## 2021-03-05 DIAGNOSIS — Z7982 Long term (current) use of aspirin: Secondary | ICD-10-CM

## 2021-03-05 DIAGNOSIS — Z8673 Personal history of transient ischemic attack (TIA), and cerebral infarction without residual deficits: Secondary | ICD-10-CM

## 2021-03-05 HISTORY — DX: Type 2 diabetes mellitus without complications: E11.9

## 2021-03-05 HISTORY — DX: Resistant hypertension: I1A.0

## 2021-03-05 HISTORY — DX: Essential (primary) hypertension: I10

## 2021-03-05 HISTORY — DX: Chronic kidney disease, stage 4 (severe): N18.4

## 2021-03-05 HISTORY — DX: Acute on chronic diastolic (congestive) heart failure: I50.33

## 2021-03-05 LAB — CBC WITH DIFFERENTIAL/PLATELET
Abs Immature Granulocytes: 0.01 10*3/uL (ref 0.00–0.07)
Basophils Absolute: 0.1 10*3/uL (ref 0.0–0.1)
Basophils Relative: 1 %
Eosinophils Absolute: 0.2 10*3/uL (ref 0.0–0.5)
Eosinophils Relative: 3 %
HCT: 37.2 % (ref 36.0–46.0)
Hemoglobin: 12.1 g/dL (ref 12.0–15.0)
Immature Granulocytes: 0 %
Lymphocytes Relative: 15 %
Lymphs Abs: 0.9 10*3/uL (ref 0.7–4.0)
MCH: 26.2 pg (ref 26.0–34.0)
MCHC: 32.5 g/dL (ref 30.0–36.0)
MCV: 80.5 fL (ref 80.0–100.0)
Monocytes Absolute: 0.5 10*3/uL (ref 0.1–1.0)
Monocytes Relative: 9 %
Neutro Abs: 4.2 10*3/uL (ref 1.7–7.7)
Neutrophils Relative %: 72 %
Platelets: 230 10*3/uL (ref 150–400)
RBC: 4.62 MIL/uL (ref 3.87–5.11)
RDW: 19.4 % — ABNORMAL HIGH (ref 11.5–15.5)
WBC: 5.9 10*3/uL (ref 4.0–10.5)
nRBC: 0 % (ref 0.0–0.2)

## 2021-03-05 LAB — BASIC METABOLIC PANEL
Anion gap: 13 (ref 5–15)
BUN: 60 mg/dL — ABNORMAL HIGH (ref 6–20)
CO2: 19 mmol/L — ABNORMAL LOW (ref 22–32)
Calcium: 9 mg/dL (ref 8.9–10.3)
Chloride: 102 mmol/L (ref 98–111)
Creatinine, Ser: 3.2 mg/dL — ABNORMAL HIGH (ref 0.44–1.00)
GFR, Estimated: 16 mL/min — ABNORMAL LOW (ref >60)
Glucose, Bld: 132 mg/dL — ABNORMAL HIGH (ref 70–99)
Potassium: 4 mmol/L (ref 3.5–5.1)
Sodium: 134 mmol/L — ABNORMAL LOW (ref 135–145)

## 2021-03-05 LAB — RESP PANEL BY RT-PCR (FLU A&B, COVID) ARPGX2
Influenza A by PCR: NEGATIVE
Influenza B by PCR: NEGATIVE
SARS Coronavirus 2 by RT PCR: NEGATIVE

## 2021-03-05 LAB — TROPONIN I (HIGH SENSITIVITY)
Troponin I (High Sensitivity): 19 ng/L — ABNORMAL HIGH (ref <18)
Troponin I (High Sensitivity): 21 ng/L — ABNORMAL HIGH (ref <18)

## 2021-03-05 NOTE — ED Provider Notes (Signed)
[name] EMERGENCY DEPARTMENT Provider Note   CSN: [id number] Arrival date & time: 03/05/21  1443     History Chief Complaint  Patient presents with   Chest Pain    Maria Hudson is a 60 y.o. female.  60 year old female with prior medical history as detailed below presents for evaluation.  Patient complains of chest pain.  Patient reports onset of chest pain this afternoon as she was beginning to eat a meal.  She reports 30 minutes of chest pressure.  Pain is now resolved.  She denies associated shortness of breath or diaphoresis.  She denies vomiting.  She denies recent fever.  Of note, patient was seen in the ED yesterday and diagnosed with possible UTI.  She reports taking 1 dose of antibiotic yesterday.  She has not yet filled her prescription.   The history is provided by the patient.  Chest Pain Pain location:  Substernal area Pain quality: aching and pressure   Pain radiates to:  Does not radiate Pain severity:  Mild Onset quality:  Sudden Duration:  30 minutes Timing:  Rare Progression:  Resolved Chronicity:  New     Past Medical History:  Diagnosis Date   CHF (congestive heart failure) ([facility])    Diabetes mellitus without complication ([place])    Hypertension    Renal disorder    Stroke ([place])     There are no problems to display for this patient.   History reviewed. No pertinent surgical history.   OB History   No obstetric history on file.     No family history on file.  Social History   Tobacco Use   Smoking status: Never    Passive exposure: Never   Smokeless tobacco: Never  Vaping Use   Vaping Use: Never used  Substance Use Topics   Alcohol use: Not Currently   Drug use: Not Currently    Home Medications Prior to Admission medications   Medication Sig Start Date End Date Taking? Authorizing Provider  cephALEXin (KEFLEX) 500 MG capsule Take 1 capsule (500 mg total) by mouth 2 (two) times daily for 5 days. 03/04/21  03/09/21  Tacy Learn, PA-C    Allergies    Hydrocodone and Oxycodone-acetaminophen  Review of Systems   Review of Systems  Cardiovascular:  Positive for chest pain.  All other systems reviewed and are negative.  Physical Exam Updated Vital Signs BP (!) 189/80   Pulse 70   Temp 97.6 F (36.4 C) (Oral)   Resp 15   Ht '5\' 3"'$  (1.6 m)   Wt 109.3 kg   SpO2 100%   BMI 42.69 kg/m   Physical Exam Vitals and nursing note reviewed.  Constitutional:      General: She is not in acute distress.    Appearance: Normal appearance. She is well-developed.  HENT:     Head: Normocephalic and atraumatic.  Eyes:     Conjunctiva/sclera: Conjunctivae normal.     Pupils: Pupils are equal, round, and reactive to light.  Cardiovascular:     Rate and Rhythm: Normal rate and regular rhythm.     Heart sounds: Normal heart sounds.  Pulmonary:     Effort: Pulmonary effort is normal. No respiratory distress.     Breath sounds: Normal breath sounds.  Abdominal:     General: There is no distension.     Palpations: Abdomen is soft.     Tenderness: There is no abdominal tenderness.  Musculoskeletal:  General: No deformity. Normal range of motion.     Cervical back: Normal range of motion and neck supple.  Skin:    General: Skin is warm and dry.  Neurological:     General: No focal deficit present.     Mental Status: She is alert and oriented to person, place, and time.    ED Results / Procedures / Treatments   Labs (all labs ordered are listed, but only abnormal results are displayed) Labs Reviewed  CBC WITH DIFFERENTIAL/PLATELET - Abnormal; Notable for the following components:      Result Value   RDW 19.4 (*)    All other components within normal limits  BASIC METABOLIC PANEL  TROPONIN I (HIGH SENSITIVITY)    EKG None  Radiology DG Chest 2 View  Result Date: 03/04/2021 CLINICAL DATA:  Shortness of breath. Congestive heart failure. Dizziness. EXAM: CHEST - 2 VIEW  COMPARISON:  None. FINDINGS: Heart size is normal. Mediastinal shadows are normal. Interstitial lung markings are prominent which could relate to chronic lung disease or mild interstitial edema. No effusions. No acute bone finding. IMPRESSION: Abnormal prominent interstitial markings which could be due to chronic interstitial lung disease or mild interstitial edema. Electronically Signed   By: Nelson Chimes M.D.   On: 03/04/2021 12:23    Procedures Procedures   Medications Ordered in ED Medications - No data to display  ED Course  I have reviewed the triage vital signs and the nursing notes.  Pertinent labs & imaging results that were available during my care of the patient were reviewed by me and considered in my medical decision making (see chart for details).    MDM Rules/Calculators/A&P                           MDM  MSE complete  Adelee Blanke was evaluated in Emergency Department on 03/05/2021 for the symptoms described in the history of present illness. She was evaluated in the context of the global COVID-19 pandemic, which necessitated consideration that the patient might be at risk for infection with the SARS-CoV-2 virus that causes COVID-19. Institutional protocols and algorithms that pertain to the evaluation of patients at risk for COVID-19 are in a state of rapid change based on information released by regulatory bodies including the CDC and federal and state organizations. These policies and algorithms were followed during the patient's care in the ED.  Patient presented with complaint of episode of chest pain.  Patient's pain is resolved upon evaluation in the ED.  Patient with prior history of cardiac disease followed by [facility].  Patient also with history of chronic kidney insufficiency again followed by [facility].  Patient was seen in our ED yesterday for evaluation of possible UTI.  She was started on Keflex.  Today's evaluation demonstrates mild bump in  creatinine.  Patient with known history of cardiac disease and will plan for admission for further work-up and observation.  Hospitalist service is aware of case and will evaluate for admission.  Final Clinical Impression(s) / ED Diagnoses Final diagnoses:  Chest pain, unspecified type    Rx / DC Orders ED Discharge Orders     None        Valarie Merino, MD 03/05/21 [phone]

## 2021-03-05 NOTE — Telephone Encounter (Signed)
Transition Care Management Unsuccessful Follow-up Telephone Call  Date of discharge and from where:  03/04/2021-[place]  Attempts:  1st Attempt  Reason for unsuccessful TCM follow-up call:  Left voice message

## 2021-03-05 NOTE — ED Triage Notes (Signed)
Here yesterday for UTI. Pt reports left sided chest pain that started at 1300 radiating to left arm and back with nausea. 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 EMS gave Zofran '4mg'$ , Aspirin '324mg'$  and Nitro SL x 1 with mild relief. Alert and oriented x 4. Initial bp was 190/92 with EMS.

## 2021-03-05 NOTE — H&P (Signed)
History and Physical    Maria Hudson [id number] DOB: May 28, 1961 DOA: 03/05/2021  PCP: Lucianne Lei, MD   Patient coming from: home   Chief Complaint: chest pain.   HPI: Maria Hudson is a 60 y.o. female with medical history significant of congestive heart failure, type 2 diabetes mellitus, hypertension, CVA, obesity class III and chronic kidney disease stage IIIb who presented with chest pain. Patient reports not feeling well for the last 3 days, generalized malaise, moderate to severe in intensity, associated with generalized weakness and poor oral intake, no improving or worsening factors.   Because of persistent symptoms she came to the emergency room yesterday, she was diagnosed with urinary tract infection and discharged home.  She took 1 tablet of cephalexin last night.   This morning her overall symptoms had mildly improved, at that time she was about to have lunch she sat down at the table and experienced precordial chest pain, moderate to severe in intensity, dull in nature, no associated symptoms, radiated to the left upper extremity. No improving or worsening factors.  Because of the features of the chest pain she asked her daughter to call 911. She took 4 baby aspirin's and on route received sublingual nitroglycerin with improvement of her chest pain.  At baseline she ambulates without any walker or cane, no angina while doing her routine activities of daily living.  She does not climb steps. She follows with [facility] for her specialty care including internal medicine, cardiology, endocrinology and nephrology.  All records personally reviewed, cardiac catheterization 2015 with LAD 20% mid lesion, circumflex 50-60% and a large branch of large ramus intermedius.  30% mid RCA, 100% right PDA left to right collaterals  ED Course: Patient had initial evaluation with electrocardiogram and troponin, so far negative for ischemic heart disease. She was noted to have elevated creatinine  and referred for inpatient further evaluation.  Review of Systems:  1. General: Generalized malaise, very poor oral intake over the last few days. 2. ENT: No runny nose or sore throat, no hearing disturbances 3. Pulmonary: No dyspnea, cough, wheezing, or hemoptysis 4. Cardiovascular: No angina, claudication, lower extremity edema, pnd or orthopnea.  Chest pain as mentioned in history of present illness. 5. Gastrointestinal: No nausea or vomiting, no diarrhea.  Positive for constipation about 2 and half weeks without no bowel movement.  She tends to be constipated with prolonged peers of time without a bowel movement. 6. Hematology: No easy bruisability or frequent infections 7. Urology: No dysuria, hematuria or increased urinary frequency 8. Dermatology: No rashes. 9. Neurology: No seizures or paresthesias 10. Musculoskeletal: No joint pain or deformities  Past Medical History:  Diagnosis Date   CHF (congestive heart failure) (HCC)    Diabetes mellitus without complication ([place])    Hypertension    Renal disorder    Stroke [facility])     History reviewed. No pertinent surgical history.   reports that she has never smoked. She has never been exposed to tobacco smoke. She has never used smokeless tobacco. She reports that she does not currently use alcohol. She reports that she does not currently use drugs.  Allergies  Allergen Reactions   Hydrocodone Itching    Patient able to tolerate when taken with Benadryl.   Oxycodone-Acetaminophen Itching    Patient able to tolerate when taken with Benadryl.    No family history on file.   Prior to Admission medications   Medication Sig Start Date End Date Taking? Authorizing Provider  allopurinol (ZYLOPRIM) 100  MG tablet Take 100 mg by mouth daily. 01/27/21  Yes [provider]  amLODipine (NORVASC) 10 MG tablet Take 10 mg by mouth every morning. 02/03/21  Yes [provider]  aspirin EC 81 MG tablet Take 81 mg by mouth  daily. Swallow whole.   Yes [provider]  atorvastatin (LIPITOR) 20 MG tablet Take 20 mg by mouth every evening. 03/02/21  Yes [provider]  buPROPion (WELLBUTRIN) 100 MG tablet Take 100 mg by mouth 2 (two) times daily. 02/04/21  Yes [provider]  cloNIDine (CATAPRES) 0.2 MG tablet Take 0.4 mg by mouth 3 (three) times daily. 02/28/21  Yes [provider]  doxazosin (CARDURA) 1 MG tablet Take 1 mg by mouth every evening. 02/19/21  Yes [provider]  ferrous sulfate 325 (65 FE) MG tablet Take 325 mg by mouth 2 (two) times daily. 02/24/21  Yes [provider]  fluticasone (FLONASE) 50 MCG/ACT nasal spray Place 1 spray into both nostrils daily as needed for allergies or rhinitis.   Yes [provider]  hydrALAZINE (APRESOLINE) 100 MG tablet Take 100 mg by mouth 3 (three) times daily. 12/12/20  Yes [provider]  isosorbide mononitrate (IMDUR) 60 MG 24 hr tablet Take 60 mg by mouth daily. 03/02/21  Yes [provider]  loratadine (CLARITIN) 10 MG tablet Take 10 mg by mouth daily. 02/14/21  Yes [provider]  Multiple Vitamin (MULTIVITAMIN ADULT) TABS Take 1 tablet by mouth daily.   Yes [provider]  NOVOLOG FLEXPEN 100 UNIT/ML FlexPen Inject 8 Units into the skin 3 (three) times daily with meals. 01/12/21  Yes [provider]  Omega-3 Fatty Acids (FISH OIL) 1000 MG CAPS Take 1 capsule by mouth daily.   Yes [provider]  pregabalin (LYRICA) 25 MG capsule Take 25 mg by mouth 2 (two) times daily. 02/03/21  Yes [provider]  sertraline (ZOLOFT) 50 MG tablet Take 50 mg by mouth daily. 02/01/21  Yes [provider]  spironolactone (ALDACTONE) 25 MG tablet Take 25 mg by mouth daily. 01/16/21  Yes [provider]  Turmeric 500 MG CAPS Take 1 capsule by mouth daily.   Yes [provider]  VICTOZA 18 MG/3ML SOPN Inject 1.8 mg into the skin daily. 01/27/21   Yes [provider]  cephALEXin (KEFLEX) 500 MG capsule Take 1 capsule (500 mg total) by mouth 2 (two) times daily for 5 days. 03/04/21 03/09/21  Tacy Learn, PA-C    Physical Exam: Vitals:   03/05/21 1448 03/05/21 1530 03/05/21 1658  BP: (!) 159/72 (!) 189/80 (!) 161/70  Pulse: 68 70 73  Resp: '15 15 12  '$ Temp: 97.6 F (36.4 C)    TempSrc: Oral    SpO2: 97% 100% 97%  Weight: 109.3 kg    Height: '5\' 3"'$  (1.6 m)      Vitals:   03/05/21 1448 03/05/21 1530 03/05/21 1658  BP: (!) 159/72 (!) 189/80 (!) 161/70  Pulse: 68 70 73  Resp: '15 15 12  '$ Temp: 97.6 F (36.4 C)    TempSrc: Oral    SpO2: 97% 100% 97%  Weight: 109.3 kg    Height: '5\' 3"'$  (1.6 m)     General: Not in pain or dyspnea, deconditioned  Neurology: Awake and alert, non focal Head and Neck. Head normocephalic. Neck supple with no adenopathy or thyromegaly.   E ENT: mild pallor, no icterus, oral mucosa moist Cardiovascular: No JVD. S1-S2 present, rhythmic, no gallops,  rubs, or murmurs. Trace non pitting bilateral lower extremity edema. Pulmonary: vesicular breath sounds bilaterally, adequate air movement, no wheezing, rhonchi or rales. Gastrointestinal. Abdomen mild distended but not tender to superficial palpation Skin. No rashes Musculoskeletal: no joint deformities    Labs on Admission: I have personally reviewed following labs and imaging studies  CBC: Recent Labs  Lab 03/04/21 1132 03/05/21 1500  WBC 5.7 5.9  NEUTROABS 4.3 4.2  HGB 12.3 12.1  HCT 38.4 37.2  MCV 81.2 80.5  PLT 254 [id number]   Basic Metabolic Panel: Recent Labs  Lab 03/04/21 1132 03/05/21 1500  NA 137 134*  K 3.7 4.0  CL 101 102  CO2 24 19*  GLUCOSE 122* 132*  BUN 48* 60*  CREATININE 2.77* 3.20*  CALCIUM 9.2 9.0   GFR: Estimated Creatinine Clearance: 22.5 mL/min (A) (by C-G formula based on SCr of 3.2 mg/dL (H)). Liver Function Tests: No results for input(s): AST, ALT, ALKPHOS, BILITOT, PROT, ALBUMIN in the last 168  hours. No results for input(s): LIPASE, AMYLASE in the last 168 hours. No results for input(s): AMMONIA in the last 168 hours. Coagulation Profile: No results for input(s): INR, PROTIME in the last 168 hours. Cardiac Enzymes: No results for input(s): CKTOTAL, CKMB, CKMBINDEX, TROPONINI in the last 168 hours. BNP (last 3 results) No results for input(s): PROBNP in the last 8760 hours. HbA1C: No results for input(s): HGBA1C in the last 72 hours. CBG: No results for input(s): GLUCAP in the last 168 hours. Lipid Profile: No results for input(s): CHOL, HDL, LDLCALC, TRIG, CHOLHDL, LDLDIRECT in the last 72 hours. Thyroid Function Tests: No results for input(s): TSH, T4TOTAL, FREET4, T3FREE, THYROIDAB in the last 72 hours. Anemia Panel: No results for input(s): VITAMINB12, FOLATE, FERRITIN, TIBC, IRON, RETICCTPCT in the last 72 hours. Urine analysis:    Component Value Date/Time   COLORURINE YELLOW 03/04/2021 2147   APPEARANCEUR CLOUDY (A) 03/04/2021 2147   LABSPEC 1.025 03/04/2021 2147   PHURINE 5.5 03/04/2021 2147   GLUCOSEU NEGATIVE 03/04/2021 2147   HGBUR NEGATIVE 03/04/2021 2147   BILIRUBINUR NEGATIVE 03/04/2021 2147   KETONESUR NEGATIVE 03/04/2021 2147   PROTEINUR >300 (A) 03/04/2021 2147   NITRITE NEGATIVE 03/04/2021 2147   LEUKOCYTESUR LARGE (A) 03/04/2021 2147    Radiological Exams on Admission: DG Chest 2 View  Result Date: 03/04/2021 CLINICAL DATA:  Shortness of breath. Congestive heart failure. Dizziness. EXAM: CHEST - 2 VIEW COMPARISON:  None. FINDINGS: Heart size is normal. Mediastinal shadows are normal. Interstitial lung markings are prominent which could relate to chronic lung disease or mild interstitial edema. No effusions. No acute bone finding. IMPRESSION: Abnormal prominent interstitial markings which could be due to chronic interstitial lung disease or mild interstitial edema. Electronically Signed   By: Nelson Chimes M.D.   On: 03/04/2021 12:23   DG Chest Port  1 View  Result Date: 03/05/2021 CLINICAL DATA:  Dyspnea. EXAM: PORTABLE CHEST 1 VIEW COMPARISON:  Chest x-ray 03/04/2021. FINDINGS: The cardiac silhouette is enlarged, a new finding. There is no focal lung consolidation, pleural effusion or pneumothorax. The osseous structures are within normal limits. IMPRESSION: 1. Cardiac silhouette appears enlarged, a new finding. Cannot exclude pericardial effusion. 2. The lungs are clear. Electronically Signed   By: Ronney Asters M.D.   On: 03/05/2021 16:12    EKG: Independently reviewed.  67 bpm, left axis deviation, normal intervals, sinus rhythm, no ST segment elevation or depression.  No T wave changes.  Assessment/Plan Principal Problem:   Chest pain  Active Problems:   CAD (coronary artery disease)   T2DM (type 2 diabetes mellitus) (HCC)   CKD (chronic kidney disease), stage III (HCC)   Class 3 obesity   CVA (cerebral vascular accident) ([place])   Chronic diastolic CHF (congestive heart failure) ([place])   Essential hypertension   AKI (acute kidney injury) ([place])   60 year old female with multiple medical problems and high cardiovascular risk who presents with chest pain.  Sudden onset while at rest, precordial, radiating to left upper extremity.  No anginal symptoms at baseline, last catheterization 2015. On her initial physical examination blood pressure 189/80, heart rate 70, respiratory 15, oxygen saturation 100%, her lungs are clear to auscultation bilaterally, heart S1-S2, present, rhythmic, no reproducible chest pain, abdomen protuberant, no lower extremity edema.  Sodium 134, potassium 4.0, chloride 102, bicarb 19, glucose 132, BUN 60, creatinine 3.20, high sensitive troponin 26-19, white count 5.9, hemoglobin 12.1, hematocrit 37.2, platelets 230.  Urinalysis specific gravity 1.025, > 300 protein, > 50 wbc   Chest radiograph with cardiomegaly.  Ms. Henige will be admitted to the hospital working diagnosis of chest pain complicated with acute  kidney injury chronic kidney disease.  Chest pain.  Patient's symptoms have improved, her high sensitive troponin is trending down.  Her EKG has no ischemic changes. Further work-up with echocardiography, check for wall motion normalities.  If persistent pain consider further ischemic work-up.   Continue aspirin, statin and blood pressure control. Add nitroglycerin and morphine for chest pain control.   2.  Acute kidney injury on chronic kidney disease stage IIIb.  Hyponatremia, anion gap metabolic acidosis Old records personally reviewed, her base creatinine is around 2.0, today significantly increased at 3.20, she also has a anion gap metabolic acidosis.  Plan to discontinue nephrotoxic agents and diuretics.  Gentle hydration with isotonic saline.  Follow-up kidney function in the morning, avoid hypotension nephrotoxic agents.  3.  Diastolic heart failure, hypertension.  Continue blood pressure control with amlodipine, clonidine, hydralazine, and isosorbide Hold on spironolactone for now.  4.  Type 2 diabetes mellitus/dyslipidemia.  Will add insulin sliding scale for glucose coverage and monitoring. Continue statin therapy with atorvastatin.  5.  Iron deficiency anemia, cell count stable, continue aspirin.  6.  Pyuria.  No frank urinary symptoms, follow-up on urine culture, hold antibiotic therapy for now.  7.  Obesity class III/ depression .  Calculated BMI 42.6. Continue sertraline and pregabalin.   Status is: Observation  The patient remains OBS appropriate and will d/c before 2 midnights.  Dispo: The patient is from: Home              Anticipated d/c is to: Home              Patient currently is not medically stable to d/c.   Difficult to place patient No    DVT prophylaxis: Enoxaparin   Code Status:    full  Family Communication:  I spoke with patient's daugter at the bedside, we talked in detail about patient's condition, plan of care and prognosis and all questions  were addressed.     Consults called:  None   Admission status:   Observation    Renatta Shrieves Gerome Apley MD [facility]   03/05/2021, 5:50 PM

## 2021-03-06 ENCOUNTER — Observation Stay (HOSPITAL_COMMUNITY): Payer: Medicaid Other

## 2021-03-06 ENCOUNTER — Other Ambulatory Visit: Payer: Self-pay

## 2021-03-06 DIAGNOSIS — I639 Cerebral infarction, unspecified: Secondary | ICD-10-CM

## 2021-03-06 DIAGNOSIS — E669 Obesity, unspecified: Secondary | ICD-10-CM | POA: Diagnosis not present

## 2021-03-06 DIAGNOSIS — R079 Chest pain, unspecified: Secondary | ICD-10-CM | POA: Diagnosis not present

## 2021-03-06 DIAGNOSIS — I10 Essential (primary) hypertension: Secondary | ICD-10-CM | POA: Diagnosis not present

## 2021-03-06 DIAGNOSIS — Z794 Long term (current) use of insulin: Secondary | ICD-10-CM | POA: Diagnosis not present

## 2021-03-06 DIAGNOSIS — N179 Acute kidney failure, unspecified: Secondary | ICD-10-CM | POA: Diagnosis not present

## 2021-03-06 DIAGNOSIS — N1832 Chronic kidney disease, stage 3b: Secondary | ICD-10-CM | POA: Diagnosis not present

## 2021-03-06 DIAGNOSIS — I2510 Atherosclerotic heart disease of native coronary artery without angina pectoris: Secondary | ICD-10-CM | POA: Diagnosis not present

## 2021-03-06 DIAGNOSIS — I2583 Coronary atherosclerosis due to lipid rich plaque: Secondary | ICD-10-CM | POA: Diagnosis not present

## 2021-03-06 DIAGNOSIS — R0789 Other chest pain: Secondary | ICD-10-CM | POA: Diagnosis not present

## 2021-03-06 DIAGNOSIS — I5032 Chronic diastolic (congestive) heart failure: Secondary | ICD-10-CM | POA: Diagnosis not present

## 2021-03-06 DIAGNOSIS — E1122 Type 2 diabetes mellitus with diabetic chronic kidney disease: Secondary | ICD-10-CM | POA: Diagnosis not present

## 2021-03-06 LAB — ECHOCARDIOGRAM COMPLETE
AR max vel: 1.83 cm2
AV Area VTI: 1.87 cm2
AV Area mean vel: 1.88 cm2
AV Mean grad: 6 mmHg
AV Peak grad: 12.3 mmHg
Ao pk vel: 1.75 m/s
Area-P 1/2: 3.5 cm2
Height: 63 in
S' Lateral: 3 cm
Weight: 3856 oz

## 2021-03-06 LAB — BASIC METABOLIC PANEL
Anion gap: 12 (ref 5–15)
BUN: 58 mg/dL — ABNORMAL HIGH (ref 6–20)
CO2: 20 mmol/L — ABNORMAL LOW (ref 22–32)
Calcium: 9.2 mg/dL (ref 8.9–10.3)
Chloride: 104 mmol/L (ref 98–111)
Creatinine, Ser: 2.94 mg/dL — ABNORMAL HIGH (ref 0.44–1.00)
GFR, Estimated: 18 mL/min — ABNORMAL LOW (ref >60)
Glucose, Bld: 102 mg/dL — ABNORMAL HIGH (ref 70–99)
Potassium: 4.2 mmol/L (ref 3.5–5.1)
Sodium: 136 mmol/L (ref 135–145)

## 2021-03-06 LAB — CBC
HCT: 40 % (ref 36.0–46.0)
Hemoglobin: 12.7 g/dL (ref 12.0–15.0)
MCH: 25.8 pg — ABNORMAL LOW (ref 26.0–34.0)
MCHC: 31.8 g/dL (ref 30.0–36.0)
MCV: 81.3 fL (ref 80.0–100.0)
Platelets: 316 10*3/uL (ref 150–400)
RBC: 4.92 MIL/uL (ref 3.87–5.11)
RDW: 19.8 % — ABNORMAL HIGH (ref 11.5–15.5)
WBC: 5.9 10*3/uL (ref 4.0–10.5)
nRBC: 0 % (ref 0.0–0.2)

## 2021-03-06 LAB — GLUCOSE, CAPILLARY
Glucose-Capillary: 184 mg/dL — ABNORMAL HIGH (ref 70–99)
Glucose-Capillary: 145 mg/dL — ABNORMAL HIGH (ref 70–99)

## 2021-03-06 LAB — HIV ANTIBODY (ROUTINE TESTING W REFLEX): HIV Screen 4th Generation wRfx: NONREACTIVE

## 2021-03-06 MED ORDER — MORPHINE SULFATE (PF) 2 MG/ML IV SOLN: 1.0000 mg | INTRAVENOUS | Status: DC | PRN

## 2021-03-06 MED ORDER — LORATADINE 10 MG PO TABS
10.0000 mg | ORAL_TABLET | Freq: Every day | ORAL | Status: DC
  Administered 2021-03-06 – 2021-03-07 (×2): 10 mg via ORAL
  Filled 2021-03-06 (×3): qty 1

## 2021-03-06 MED ORDER — ADULT MULTIVITAMIN W/MINERALS CH
1.0000 | ORAL_TABLET | Freq: Every day | ORAL | Status: DC
  Administered 2021-03-06 – 2021-03-07 (×2): 1 via ORAL
  Filled 2021-03-06 (×2): qty 1

## 2021-03-06 MED ORDER — ONDANSETRON HCL 4 MG PO TABS: 4.0000 mg | ORAL_TABLET | Freq: Four times a day (QID) | ORAL | Status: DC | PRN

## 2021-03-06 MED ORDER — ONDANSETRON HCL 4 MG/2ML IJ SOLN: 4.0000 mg | Freq: Four times a day (QID) | INTRAMUSCULAR | Status: DC | PRN

## 2021-03-06 MED ORDER — ASPIRIN EC 81 MG PO TBEC
81.0000 mg | DELAYED_RELEASE_TABLET | Freq: Every day | ORAL | Status: DC
  Administered 2021-03-06 – 2021-03-07 (×2): 81 mg via ORAL
  Filled 2021-03-06 (×2): qty 1

## 2021-03-06 MED ORDER — FERROUS SULFATE 325 (65 FE) MG PO TABS
325.0000 mg | ORAL_TABLET | Freq: Two times a day (BID) | ORAL | Status: DC
  Administered 2021-03-06 – 2021-03-07 (×4): 325 mg via ORAL
  Filled 2021-03-06 (×4): qty 1

## 2021-03-06 MED ORDER — FLUTICASONE PROPIONATE 50 MCG/ACT NA SUSP
1.0000 | Freq: Every day | NASAL | Status: DC | PRN
  Filled 2021-03-06: qty 16

## 2021-03-06 MED ORDER — ENOXAPARIN SODIUM 30 MG/0.3ML IJ SOSY
30.0000 mg | PREFILLED_SYRINGE | INTRAMUSCULAR | Status: DC
  Administered 2021-03-06 – 2021-03-07 (×2): 30 mg via SUBCUTANEOUS
  Filled 2021-03-06 (×2): qty 0.3

## 2021-03-06 MED ORDER — AMLODIPINE BESYLATE 10 MG PO TABS
10.0000 mg | ORAL_TABLET | Freq: Every morning | ORAL | Status: DC
  Administered 2021-03-06 – 2021-03-07 (×2): 10 mg via ORAL
  Filled 2021-03-06: qty 1
  Filled 2021-03-06: qty 2

## 2021-03-06 MED ORDER — SERTRALINE HCL 50 MG PO TABS
50.0000 mg | ORAL_TABLET | Freq: Every day | ORAL | Status: DC
  Administered 2021-03-06 – 2021-03-07 (×2): 50 mg via ORAL
  Filled 2021-03-06 (×2): qty 1

## 2021-03-06 MED ORDER — ATORVASTATIN CALCIUM 10 MG PO TABS
20.0000 mg | ORAL_TABLET | Freq: Every evening | ORAL | Status: DC
  Administered 2021-03-06: 20 mg via ORAL
  Filled 2021-03-06 (×3): qty 2

## 2021-03-06 MED ORDER — ACETAMINOPHEN 325 MG PO TABS: 650.0000 mg | ORAL_TABLET | Freq: Four times a day (QID) | ORAL | Status: DC | PRN

## 2021-03-06 MED ORDER — POLYETHYLENE GLYCOL 3350 17 G PO PACK
17.0000 g | PACK | Freq: Two times a day (BID) | ORAL | Status: DC
  Administered 2021-03-06 – 2021-03-07 (×4): 17 g via ORAL
  Filled 2021-03-06 (×4): qty 1

## 2021-03-06 MED ORDER — DOXAZOSIN MESYLATE 1 MG PO TABS
1.0000 mg | ORAL_TABLET | Freq: Every evening | ORAL | Status: DC
  Administered 2021-03-06: 1 mg via ORAL
  Filled 2021-03-06 (×2): qty 1

## 2021-03-06 MED ORDER — HYDRALAZINE HCL 50 MG PO TABS
100.0000 mg | ORAL_TABLET | Freq: Three times a day (TID) | ORAL | Status: DC
  Administered 2021-03-06 – 2021-03-07 (×4): 100 mg via ORAL
  Filled 2021-03-06 (×4): qty 2

## 2021-03-06 MED ORDER — CLONIDINE HCL 0.2 MG PO TABS
0.4000 mg | ORAL_TABLET | Freq: Three times a day (TID) | ORAL | Status: DC
  Administered 2021-03-06 – 2021-03-07 (×4): 0.4 mg via ORAL
  Filled 2021-03-06 (×4): qty 2

## 2021-03-06 MED ORDER — SODIUM CHLORIDE 0.9 % IV SOLN: INTRAVENOUS | Status: DC

## 2021-03-06 MED ORDER — PREGABALIN 25 MG PO CAPS
25.0000 mg | ORAL_CAPSULE | Freq: Two times a day (BID) | ORAL | Status: DC
  Administered 2021-03-06 – 2021-03-07 (×4): 25 mg via ORAL
  Filled 2021-03-06 (×4): qty 1

## 2021-03-06 MED ORDER — NITROGLYCERIN 0.4 MG SL SUBL: 0.4000 mg | SUBLINGUAL_TABLET | SUBLINGUAL | Status: DC | PRN

## 2021-03-06 MED ORDER — ALLOPURINOL 100 MG PO TABS
100.0000 mg | ORAL_TABLET | Freq: Every day | ORAL | Status: DC
  Administered 2021-03-06 – 2021-03-07 (×2): 100 mg via ORAL
  Filled 2021-03-06 (×2): qty 1

## 2021-03-06 MED ORDER — ACETAMINOPHEN 650 MG RE SUPP: 650.0000 mg | Freq: Four times a day (QID) | RECTAL | Status: DC | PRN

## 2021-03-06 MED ORDER — ISOSORBIDE MONONITRATE ER 60 MG PO TB24
60.0000 mg | ORAL_TABLET | Freq: Every day | ORAL | Status: DC
  Administered 2021-03-06 – 2021-03-07 (×2): 60 mg via ORAL
  Filled 2021-03-06: qty 2
  Filled 2021-03-06: qty 1

## 2021-03-06 MED ORDER — BUPROPION HCL 100 MG PO TABS
100.0000 mg | ORAL_TABLET | Freq: Two times a day (BID) | ORAL | Status: DC
  Administered 2021-03-06 – 2021-03-07 (×3): 100 mg via ORAL
  Filled 2021-03-06 (×6): qty 1

## 2021-03-06 MED ORDER — BISACODYL 10 MG RE SUPP
10.0000 mg | Freq: Once | RECTAL | Status: AC
  Administered 2021-03-06: 10 mg via RECTAL
  Filled 2021-03-06: qty 1

## 2021-03-06 NOTE — Progress Notes (Signed)
  Echocardiogram 2D Echocardiogram has been performed.  Merrie Roof F 03/06/2021, 12:08 PM

## 2021-03-06 NOTE — Progress Notes (Signed)
PROGRESS NOTE    Maria Hudson  [id number] DOB: 11/05/1960 DOA: 03/05/2021 PCP: Maria Lei, MD    Brief Narrative:  Maria Hudson will be admitted to the hospital working diagnosis of chest pain complicated with acute kidney injury chronic kidney disease.  60 year old female with multiple medical problems and high cardiovascular risk who presents with chest pain.  Sudden onset while at rest, precordial, radiating to left upper extremity.  No anginal symptoms at baseline, last catheterization 2015. On her initial physical examination blood pressure 189/80, heart rate 70, respiratory 15, oxygen saturation 100%, her lungs are clear to auscultation bilaterally, heart S1-S2, present, rhythmic, no reproducible chest pain, abdomen protuberant, no lower extremity edema.   Sodium 134, potassium 4.0, chloride 102, bicarb 19, glucose 132, BUN 60, creatinine 3.20, high sensitive troponin 26-19, white count 5.9, hemoglobin 12.1, hematocrit 37.2, platelets 230.   Urinalysis specific gravity 1.025, > 300 protein, > 50 wbc    Chest radiograph with cardiomegaly.  Assessment & Plan:   Principal Problem:   Chest pain Active Problems:   CAD (coronary artery disease)   T2DM (type 2 diabetes mellitus) (HCC)   CKD (chronic kidney disease), stage III (HCC)   Class 3 obesity   CVA (cerebral vascular accident) ([place])   Chronic diastolic CHF (congestive heart failure) (HCC)   Essential hypertension   AKI (acute kidney injury) ([place])   Chest pain.   Chest pain has improved, reports to be back to baseline.  Echocardiogram with preserved LV systolic function and no wall motion abnormalities.    On aspirin, statin and blood pressure control. Continue with as needed nitroglycerin and morphine   2.  Acute kidney injury on chronic kidney disease stage IIIb.  Hyponatremia, anion gap metabolic acidosis Old records personally reviewed, her base creatinine is around 2.0,   Renal function with serum cr at 2,94.  K is 4,2 and serum bicarbonate at 20. Plan to continue with isotonic saline at 75 ml per hr and follow renal panel in am. Avoid hypotension and nephrotoxic medications.   3.  Diastolic heart failure, hypertension.  Continue blood pressure control with amlodipine, clonidine, hydralazine, and isosorbide Hold on spironolactone for now.   4.  Type 2 diabetes mellitus/dyslipidemia.   Fasting glucose is 102. Continue with insulin sliding scale for glucose cover and monitoring On atorvastatin.   5.  Iron deficiency anemia, cell count stable, continue aspirin. Follow up hgb is 12.7   6.  Pyuria.  No frank urinary symptoms,  Pending final report on urine culture, for now will continue to hold on antibiotic therapy.    7.  Obesity class III/ depression .  Calculated BMI 42.6. On sertraline and pregabalin.    Patient continue to be at high risk for worsening renal function   Status is: Observation  The patient will require care spanning > 2 midnights and should be moved to inpatient because: IV treatments appropriate due to intensity of illness or inability to take PO  Dispo: The patient is from: Home              Anticipated d/c is to: Home              Patient currently is not medically stable to d/c.   Difficult to place patient No  DVT prophylaxis: Enoxaparin    Code Status:    full Family Communication:    No family at the bedside    Subjective: Patient with no nausea or vomiting, chest pain is back to  baseline, positive bowel movement.   Objective: Vitals:   03/06/21 1100 03/06/21 1308 03/06/21 1311 03/06/21 1330  BP: (!) 156/63 (!) 158/66  (!) 168/67  Pulse:  70 70 69  Resp: '15 18  18  '$ Temp:  98 F (36.7 C) 98.2 F (36.8 C) 97.7 F (36.5 C)  TempSrc:  Oral Oral Oral  SpO2:  97%  100%  Weight:      Height:        Intake/Output Summary (Last 24 hours) at 03/06/2021 1554 Last data filed at 03/06/2021 1500 Gross per 24 hour  Intake 711.21 ml  Output --  Net 711.21  ml   Filed Weights   03/05/21 1448  Weight: 109.3 kg    Examination:   General: Not in pain or dyspnea, deconditioned  Neurology: Awake and alert, non focal  E ENT: no pallor, no icterus, oral mucosa moist Cardiovascular: No JVD. S1-S2 present, rhythmic, no gallops, rubs, or murmurs. No lower extremity edema. Pulmonary: positive breath sounds bilaterally, adequate air movement, no wheezing, rhonchi or rales. Gastrointestinal. Abdomen soft and non tender Skin. No rashes Musculoskeletal: no joint deformities     Data Reviewed: I have personally reviewed following labs and imaging studies  CBC: Recent Labs  Lab 03/04/21 1132 03/05/21 1500 03/06/21 0545  WBC 5.7 5.9 5.9  NEUTROABS 4.3 4.2  --   HGB 12.3 12.1 12.7  HCT 38.4 37.2 40.0  MCV 81.2 80.5 81.3  PLT 254 230 [id number]   Basic Metabolic Panel: Recent Labs  Lab 03/04/21 1132 03/05/21 1500 03/06/21 0545  NA 137 134* 136  K 3.7 4.0 4.2  CL 101 102 104  CO2 24 19* 20*  GLUCOSE 122* 132* 102*  BUN 48* 60* 58*  CREATININE 2.77* 3.20* 2.94*  CALCIUM 9.2 9.0 9.2   GFR: Estimated Creatinine Clearance: 24.5 mL/min (A) (by C-G formula based on SCr of 2.94 mg/dL (H)). Liver Function Tests: No results for input(s): AST, ALT, ALKPHOS, BILITOT, PROT, ALBUMIN in the last 168 hours. No results for input(s): LIPASE, AMYLASE in the last 168 hours. No results for input(s): AMMONIA in the last 168 hours. Coagulation Profile: No results for input(s): INR, PROTIME in the last 168 hours. Cardiac Enzymes: No results for input(s): CKTOTAL, CKMB, CKMBINDEX, TROPONINI in the last 168 hours. BNP (last 3 results) No results for input(s): PROBNP in the last 8760 hours. HbA1C: No results for input(s): HGBA1C in the last 72 hours. CBG: No results for input(s): GLUCAP in the last 168 hours. Lipid Profile: No results for input(s): CHOL, HDL, LDLCALC, TRIG, CHOLHDL, LDLDIRECT in the last 72 hours. Thyroid Function Tests: No results for  input(s): TSH, T4TOTAL, FREET4, T3FREE, THYROIDAB in the last 72 hours. Anemia Panel: No results for input(s): VITAMINB12, FOLATE, FERRITIN, TIBC, IRON, RETICCTPCT in the last 72 hours.    Radiology Studies: I have reviewed all of the imaging during this hospital visit personally     Scheduled Meds:  allopurinol  100 mg Oral Daily   amLODipine  10 mg Oral q morning   aspirin EC  81 mg Oral Daily   atorvastatin  20 mg Oral QPM   buPROPion  100 mg Oral BID   cloNIDine  0.4 mg Oral TID   doxazosin  1 mg Oral QPM   enoxaparin (LOVENOX) injection  30 mg Subcutaneous Q24H   ferrous sulfate  325 mg Oral BID   hydrALAZINE  100 mg Oral TID   isosorbide mononitrate  60 mg Oral Daily  loratadine  10 mg Oral Daily   multivitamin with minerals  1 tablet Oral Daily   polyethylene glycol  17 g Oral BID   pregabalin  25 mg Oral BID   sertraline  50 mg Oral Daily   Continuous Infusions:  sodium chloride 75 mL/hr at 03/06/21 1306     LOS: 0 days        Veena Sturgess Gerome Apley, MD

## 2021-03-06 NOTE — Telephone Encounter (Signed)
Transition Care Management Follow-up Telephone Call Date of discharge and from where: 03/04/2021-  How have you been since you were released from the hospital? Patient stated she had to return to the ED and is currently admitted  Any questions or concerns? No  Items Reviewed: Did the pt receive and understand the discharge instructions provided? Yes  Medications obtained and verified? Yes  Other? No  Any new allergies since your discharge? No  Dietary orders reviewed? N/A Do you have support at home? Yes   Home Care and Equipment/Supplies: Were home health services ordered? not applicable If so, what is the name of the agency? N/A  Has the agency set up a time to come to the patient's home? not applicable Were any new equipment or medical supplies ordered?  No What is the name of the medical supply agency? N/A Were you able to get the supplies/equipment? not applicable Do you have any questions related to the use of the equipment or supplies? No  Functional Questionnaire: (I = Independent and D = Dependent) ADLs: I  Bathing/Dressing- I  Meal Prep- I  Eating- I  Maintaining continence- I  Transferring/Ambulation- I  Managing Meds- I  Follow up appointments reviewed:  PCP Hospital f/u appt confirmed? No  Specialist Hospital f/u appt confirmed? No   Are transportation arrangements needed? No  If their condition worsens, is the pt aware to call PCP or go to the Emergency Dept.? Yes Was the patient provided with contact information for the PCP's office or ED? Yes Was to pt encouraged to call back with questions or concerns? Yes  *Patient is currently admitted

## 2021-03-07 DIAGNOSIS — E669 Obesity, unspecified: Secondary | ICD-10-CM | POA: Diagnosis not present

## 2021-03-07 DIAGNOSIS — N1832 Chronic kidney disease, stage 3b: Secondary | ICD-10-CM | POA: Diagnosis not present

## 2021-03-07 DIAGNOSIS — I2510 Atherosclerotic heart disease of native coronary artery without angina pectoris: Secondary | ICD-10-CM | POA: Diagnosis not present

## 2021-03-07 DIAGNOSIS — R079 Chest pain, unspecified: Secondary | ICD-10-CM | POA: Diagnosis not present

## 2021-03-07 DIAGNOSIS — Z794 Long term (current) use of insulin: Secondary | ICD-10-CM | POA: Diagnosis not present

## 2021-03-07 DIAGNOSIS — I2583 Coronary atherosclerosis due to lipid rich plaque: Secondary | ICD-10-CM | POA: Diagnosis not present

## 2021-03-07 DIAGNOSIS — E1122 Type 2 diabetes mellitus with diabetic chronic kidney disease: Secondary | ICD-10-CM

## 2021-03-07 DIAGNOSIS — N179 Acute kidney failure, unspecified: Secondary | ICD-10-CM | POA: Diagnosis not present

## 2021-03-07 DIAGNOSIS — I5032 Chronic diastolic (congestive) heart failure: Secondary | ICD-10-CM | POA: Diagnosis not present

## 2021-03-07 DIAGNOSIS — I639 Cerebral infarction, unspecified: Secondary | ICD-10-CM | POA: Diagnosis not present

## 2021-03-07 DIAGNOSIS — I10 Essential (primary) hypertension: Secondary | ICD-10-CM | POA: Diagnosis not present

## 2021-03-07 LAB — BASIC METABOLIC PANEL
Anion gap: 9 (ref 5–15)
BUN: 57 mg/dL — ABNORMAL HIGH (ref 6–20)
CO2: 22 mmol/L (ref 22–32)
Calcium: 8.5 mg/dL — ABNORMAL LOW (ref 8.9–10.3)
Chloride: 105 mmol/L (ref 98–111)
Creatinine, Ser: 2.69 mg/dL — ABNORMAL HIGH (ref 0.44–1.00)
GFR, Estimated: 20 mL/min — ABNORMAL LOW (ref >60)
Glucose, Bld: 158 mg/dL — ABNORMAL HIGH (ref 70–99)
Potassium: 4 mmol/L (ref 3.5–5.1)
Sodium: 136 mmol/L (ref 135–145)

## 2021-03-07 LAB — URINE CULTURE: Culture: >=100000 — AB

## 2021-03-07 LAB — GLUCOSE, CAPILLARY: Glucose-Capillary: 156 mg/dL — ABNORMAL HIGH (ref 70–99)

## 2021-03-07 MED ORDER — CARVEDILOL 25 MG PO TABS
25.0000 mg | ORAL_TABLET | Freq: Two times a day (BID) | ORAL | Status: DC
  Administered 2021-03-07: 25 mg via ORAL
  Filled 2021-03-07: qty 1

## 2021-03-07 MED ORDER — NITROGLYCERIN 0.4 MG SL SUBL: 0.4000 mg | SUBLINGUAL_TABLET | SUBLINGUAL | 0 refills | Status: AC | PRN

## 2021-03-07 MED ORDER — AMOXICILLIN 250 MG PO CAPS: 250.0000 mg | ORAL_CAPSULE | Freq: Two times a day (BID) | ORAL | 0 refills | Status: AC

## 2021-03-07 MED ORDER — POLYETHYLENE GLYCOL 3350 17 G PO PACK
17.0000 g | PACK | Freq: Every day | ORAL | 0 refills | Status: DC
Start: 2021-03-07 — End: 2021-08-19

## 2021-03-07 MED ORDER — CARVEDILOL 25 MG PO TABS: 25.0000 mg | ORAL_TABLET | Freq: Two times a day (BID) | ORAL | 0 refills | Status: DC

## 2021-03-07 MED ORDER — AMOXICILLIN 250 MG PO CAPS
250.0000 mg | ORAL_CAPSULE | Freq: Two times a day (BID) | ORAL | Status: DC
  Administered 2021-03-07: 250 mg via ORAL
  Filled 2021-03-07 (×2): qty 1

## 2021-03-07 MED ORDER — OXYCODONE-ACETAMINOPHEN 5-325 MG PO TABS
1.0000 | ORAL_TABLET | Freq: Once | ORAL | Status: AC
  Administered 2021-03-07: 1 via ORAL
  Filled 2021-03-07: qty 1

## 2021-03-07 NOTE — Discharge Summary (Addendum)
Physician Discharge Summary  Passion Mashak [id number] DOB: [birth year] DOA: 03/05/2021  PCP: Lucianne Lei, MD  Admit date: 03/05/2021 Discharge date: 03/07/2021  Admitted From: Home  Disposition:  Home   Recommendations for Outpatient Follow-up and new medication changes:  Follow up with Dr. Criss Rosales in 7 to 10 days. Holding on spironolactone until improvement in renal failure. Added as needed nitroglycerin  Follow up renal function as outpatient in 7 days.  Follow up with Dr Oval Linsey advanced blood pressure clinic. Referral for Nephrology outpatient follow up.   Home Health: no   Equipment/Devices: no    Discharge Condition: stable  CODE STATUS: full  Diet recommendation:  hearth healthy and diabetic prudent.   Brief/Interim Summary: Mrs. Alatorre was be admitted to the hospital with the working diagnosis of chest pain complicated with acute kidney injury on chronic kidney disease 50b.    60 year old female with multiple medical problems and high cardiovascular risk who presents with chest pain.  Sudden onset while at rest, precordial, radiating to left upper extremity.  No anginal symptoms at baseline, last catheterization 2015. Patient not feeling well for 3 days, with generalized malaise, and poor oral intake.  On her initial physical examination blood pressure 189/80, heart rate 70, respiratory rate 15, oxygen saturation 100%, her lungs were clear to auscultation bilaterally, heart S1-S2, present, rhythmic, no reproducible chest pain, abdomen protuberant, no lower extremity edema.   Sodium 134, potassium 4.0, chloride 102, bicarb 19, glucose 132, BUN 60, creatinine 3.20, high sensitive troponin 26-19, white count 5.9, hemoglobin 12.1, hematocrit 37.2, platelets 230.   Urinalysis specific gravity 1.025, > 300 protein, > 50 wbc    Chest radiograph with cardiomegaly.   Patient was placed on IV fluids with improvement in her renal function.  Follow as out patient renal function.    Chest pain, ruled out for acute coronary syndrome/ uncontrolled HTN. Patient with improvement in chest pain. Troponin elevation pattern not consistent with acute coronary syndrome.   Further work-up with echocardiography showed a preserved LV systolic function, 55 to [id number].  Mild left ventricular hypertrophy.  No significant valvular disease.  Patient will continue taking aspirin and statin therapy. Needs better blood pressure control.  She has changed primary care to [facility] from [place].  Continue blood pressure therapy with amlodipine, carvedilol, hydralazine, isosorbide and doxazosin. Life style modifications.  I will make a referral to the difficulty controlled hypertension clinic with Dr. Oval Linsey.  2.  Acute kidney injury on chronic kidney disease stage IIIb.  Hyponatremia, anion gap metabolic acidosis.  Her baseline kidney function is between 2.0 and 2.6. Patient received isotonic intravenous fluids with improvement of kidney function, at discharge sodium 136, potassium 4.0, chloride 105, bicarb 22, glucose 158, BUN 57, creatinine 2.69.  Patient will need to continue follow-up with nephrology as an outpatient. Considering her worsening kidney function spironolactone has been held to avoid hyperkalemia. In the past she has been on diuretics, including bumetanide and metolazone. Today no signs of volume overload.   We have talked about her kidney function and its role in blood pressure control.  She will continue with lifestyle modifications and close follow-up as an outpatient.  Follow-up kidney function as an outpatient.  3.  Diastolic heart failure.  Continue aggressive blood pressure control. Holding spironolactone for now.  4.  Type 2 diabetes mellitus.  Her glucose remained stable during her hospitalization. Continue atorvastatin.  5.  Iron deficiency anemia.  Her hemoglobin and hematocrit remained stable.  6.  Enterococcus urinary tract  infection.  Patient will be  prescribed ampicillin for 3 days.  7.  Obesity class III.  Depression.  Calculated BMI 42.6, Continue sertraline and pregabalin.     Discharge Diagnoses:  Principal Problem:   Chest pain Active Problems:   CAD (coronary artery disease)   T2DM (type 2 diabetes mellitus) (HCC)   CKD (chronic kidney disease), stage III (HCC)   Class 3 obesity   CVA (cerebral vascular accident) ([place])   Chronic diastolic CHF (congestive heart failure) ([place])   Essential hypertension   AKI (acute kidney injury) ([place])    Discharge Instructions   Allergies as of 03/07/2021       Reactions   Hydrocodone Itching   Patient able to tolerate when taken with Benadryl.   Oxycodone-acetaminophen Itching   Patient able to tolerate when taken with Benadryl.        Medication List     STOP taking these medications    cephALEXin 500 MG capsule Commonly known as: KEFLEX   spironolactone 25 MG tablet Commonly known as: ALDACTONE       TAKE these medications    allopurinol 100 MG tablet Commonly known as: ZYLOPRIM Take 100 mg by mouth daily.   amLODipine 10 MG tablet Commonly known as: NORVASC Take 10 mg by mouth every morning.   amoxicillin 250 MG capsule Commonly known as: AMOXIL Take 1 capsule (250 mg total) by mouth every 12 (twelve) hours for 3 days.   aspirin EC 81 MG tablet Take 81 mg by mouth daily. Swallow whole.   atorvastatin 20 MG tablet Commonly known as: LIPITOR Take 20 mg by mouth every evening.   buPROPion 100 MG tablet Commonly known as: WELLBUTRIN Take 100 mg by mouth 2 (two) times daily.   carvedilol 25 MG tablet Commonly known as: COREG Take 1 tablet (25 mg total) by mouth 2 (two) times daily with a meal.   cloNIDine 0.2 MG tablet Commonly known as: CATAPRES Take 0.4 mg by mouth 3 (three) times daily.   doxazosin 1 MG tablet Commonly known as: CARDURA Take 1 mg by mouth every evening.   ferrous sulfate 325 (65 FE) MG tablet Take 325 mg by mouth 2  (two) times daily.   Fish Oil 1000 MG Caps Take 1 capsule by mouth daily.   fluticasone 50 MCG/ACT nasal spray Commonly known as: FLONASE Place 1 spray into both nostrils daily as needed for allergies or rhinitis.   hydrALAZINE 100 MG tablet Commonly known as: APRESOLINE Take 100 mg by mouth 3 (three) times daily.   isosorbide mononitrate 60 MG 24 hr tablet Commonly known as: IMDUR Take 60 mg by mouth daily.   loratadine 10 MG tablet Commonly known as: CLARITIN Take 10 mg by mouth daily.   Multivitamin Adult Tabs Take 1 tablet by mouth daily.   nitroGLYCERIN 0.4 MG SL tablet Commonly known as: NITROSTAT Place 1 tablet (0.4 mg total) under the tongue every 5 (five) minutes as needed for chest pain.   NovoLOG FlexPen 100 UNIT/ML FlexPen Generic drug: insulin aspart Inject 8 Units into the skin 3 (three) times daily with meals.   polyethylene glycol 17 g packet Commonly known as: MIRALAX / GLYCOLAX Take 17 g by mouth daily.   pregabalin 25 MG capsule Commonly known as: LYRICA Take 25 mg by mouth 2 (two) times daily.   sertraline 50 MG tablet Commonly known as: ZOLOFT Take 50 mg by mouth daily.   Turmeric 500 MG Caps Take 1 capsule by mouth daily.  Victoza 18 MG/3ML Sopn Generic drug: liraglutide Inject 1.8 mg into the skin daily.        Allergies  Allergen Reactions   Hydrocodone Itching    Patient able to tolerate when taken with Benadryl.   Oxycodone-Acetaminophen Itching    Patient able to tolerate when taken with Benadryl.      Procedures/Studies: DG Chest 2 View  Result Date: 03/04/2021 CLINICAL DATA:  Shortness of breath. Congestive heart failure. Dizziness. EXAM: CHEST - 2 VIEW COMPARISON:  None. FINDINGS: Heart size is normal. Mediastinal shadows are normal. Interstitial lung markings are prominent which could relate to chronic lung disease or mild interstitial edema. No effusions. No acute bone finding. IMPRESSION: Abnormal prominent  interstitial markings which could be due to chronic interstitial lung disease or mild interstitial edema. Electronically Signed   By: Nelson Chimes M.D.   On: 03/04/2021 12:23   DG Chest Port 1 View  Result Date: 03/05/2021 CLINICAL DATA:  Dyspnea. EXAM: PORTABLE CHEST 1 VIEW COMPARISON:  Chest x-ray 03/04/2021. FINDINGS: The cardiac silhouette is enlarged, a new finding. There is no focal lung consolidation, pleural effusion or pneumothorax. The osseous structures are within normal limits. IMPRESSION: 1. Cardiac silhouette appears enlarged, a new finding. Cannot exclude pericardial effusion. 2. The lungs are clear. Electronically Signed   By: Ronney Asters M.D.   On: 03/05/2021 16:12   ECHOCARDIOGRAM COMPLETE  Result Date: 03/06/2021    ECHOCARDIOGRAM REPORT   Patient Name:   SUE-ANN BRADNEY Date of Exam: 03/06/2021 Medical Rec #:  [id number]      Height:       63.0 in Accession #:    [id number]     Weight:       241.0 lb Date of Birth:  December 26, 1960      BSA:          2.093 m Patient Age:    58 years       BP:           180/71 mmHg Patient Gender: F              HR:           68 bpm. Exam Location:  Inpatient Procedure: 2D Echo, Cardiac Doppler and Color Doppler Indications:    Chest pain  History:        Patient has no prior history of Echocardiogram examinations.                 Previous Myocardial Infarction; Signs/Symptoms:Chest Pain.  Sonographer:    Merrie Roof RDCS Referring Phys: [id number] [place]  1. Left ventricular ejection fraction, by estimation, is 55 to 60%. The left ventricle has normal function. The left ventricle has no regional wall motion abnormalities. There is mild left ventricular hypertrophy. Left ventricular diastolic parameters are consistent with Grade I diastolic dysfunction (impaired relaxation). Elevated left atrial pressure.  2. Right ventricular systolic function is normal. The right ventricular size is normal.  3. Left atrial size was mildly dilated.  4.  The mitral valve is normal in structure. Trivial mitral valve regurgitation. No evidence of mitral stenosis.  5. The aortic valve is tricuspid. Aortic valve regurgitation is not visualized. No aortic stenosis is present.  6. The inferior vena cava is normal in size with greater than 50% respiratory variability, suggesting right atrial pressure of 3 mmHg. FINDINGS  Left Ventricle: Left ventricular ejection fraction, by estimation, is 55 to 60%. The left ventricle has normal function. The left ventricle  has no regional wall motion abnormalities. The left ventricular internal cavity size was normal in size. There is  mild left ventricular hypertrophy. Left ventricular diastolic parameters are consistent with Grade I diastolic dysfunction (impaired relaxation). Elevated left atrial pressure. Right Ventricle: The right ventricular size is normal. Right ventricular systolic function is normal. Left Atrium: Left atrial size was mildly dilated. Right Atrium: Right atrial size was normal in size. Pericardium: There is no evidence of pericardial effusion. Mitral Valve: The mitral valve is normal in structure. Mild mitral annular calcification. Trivial mitral valve regurgitation. No evidence of mitral valve stenosis. Tricuspid Valve: The tricuspid valve is normal in structure. Tricuspid valve regurgitation is not demonstrated. No evidence of tricuspid stenosis. Aortic Valve: The aortic valve is tricuspid. Aortic valve regurgitation is not visualized. No aortic stenosis is present. Aortic valve mean gradient measures 6.0 mmHg. Aortic valve peak gradient measures 12.2 mmHg. Aortic valve area, by VTI measures 1.87  cm. Pulmonic Valve: The pulmonic valve was normal in structure. Pulmonic valve regurgitation is trivial. No evidence of pulmonic stenosis. Aorta: The aortic root is normal in size and structure. Venous: The inferior vena cava is normal in size with greater than 50% respiratory variability, suggesting right atrial  pressure of 3 mmHg. IAS/Shunts: No atrial level shunt detected by color flow Doppler.  LEFT VENTRICLE PLAX 2D LVIDd:         4.60 cm  Diastology LVIDs:         3.00 cm  LV e' medial:    5.25 cm/s LV PW:         1.20 cm  LV E/e' medial:  18.2 LV IVS:        1.10 cm  LV e' lateral:   6.00 cm/s LVOT diam:     1.90 cm  LV E/e' lateral: 15.9 LV SV:         75 LV SV Index:   36 LVOT Area:     2.84 cm  RIGHT VENTRICLE RV Basal diam:  4.00 cm LEFT ATRIUM             Index       RIGHT ATRIUM           Index LA diam:        5.00 cm 2.39 cm/m  RA Area:     15.00 cm LA Vol (A2C):   78.8 ml 37.65 ml/m RA Volume:   34.90 ml  16.67 ml/m LA Vol (A4C):   66.7 ml 31.87 ml/m LA Biplane Vol: 74.7 ml 35.69 ml/m  AORTIC VALVE AV Area (Vmax):    1.83 cm AV Area (Vmean):   1.88 cm AV Area (VTI):     1.87 cm AV Vmax:           175.00 cm/s AV Vmean:          115.000 cm/s AV VTI:            0.398 m AV Peak Grad:      12.2 mmHg AV Mean Grad:      6.0 mmHg LVOT Vmax:         113.00 cm/s LVOT Vmean:        76.200 cm/s LVOT VTI:          0.263 m LVOT/AV VTI ratio: 0.66  AORTA Ao Root diam: 2.90 cm Ao Asc diam:  3.10 cm MITRAL VALVE MV Area (PHT): 3.50 cm     SHUNTS MV Decel Time: 217 msec  Systemic VTI:  0.26 m MV E velocity: 95.30 cm/s   Systemic Diam: 1.90 cm MV A velocity: 127.00 cm/s MV E/A ratio:  0.75 Kirk Ruths MD Electronically signed by Kirk Ruths MD Signature Date/Time: 03/06/2021/1:37:19 PM    Final       Subjective: Patient with no nausea or vomiting, chest pain has improved. This am with headache,   Discharge Exam: Vitals:   03/07/21 0428 03/07/21 0630  BP:  (!) 182/72  Pulse: 64   Resp: 18   Temp: 98 F (36.7 C)   SpO2: 98%    Vitals:   03/06/21 2017 03/06/21 2327 03/07/21 0428 03/07/21 0630  BP: (!) 166/73 (!) 179/80  (!) 182/72  Pulse: 68 60 64   Resp: '17 18 18   '$ Temp: 98.7 F (37.1 C) 98 F (36.7 C) 98 F (36.7 C)   TempSrc: Oral Oral Oral   SpO2: 99% 99% 98%   Weight:       Height:        General: Not in pain or dyspnea  Neurology: Awake and alert, non focal  E ENT: no pallor, no icterus, oral mucosa moist Cardiovascular: No JVD. S1-S2 present, rhythmic, no gallops, rubs, or murmurs. No lower extremity edema. Pulmonary: positive breath sounds bilaterally, adequate air movement, no wheezing, rhonchi or rales. Gastrointestinal. Abdomen soft and non tender Skin. No rashes Musculoskeletal: no joint deformities   The results of significant diagnostics from this hospitalization (including imaging, microbiology, ancillary and laboratory) are listed below for reference.     Microbiology: Recent Results (from the past 240 hour(s))  Urine Culture     Status: Abnormal   Collection Time: 03/04/21  9:47 PM   Specimen: Urine, Clean Catch  Result Value Ref Range Status   Specimen Description URINE, CLEAN CATCH  Final   Special Requests   Final    NONE Performed at [facility], [place] [address], [place], [place] [address]    Culture >=100,000 COLONIES/mL ENTEROCOCCUS FAECALIS (A)  Final   Report Status 03/07/2021 FINAL  Final   Organism ID, Bacteria ENTEROCOCCUS FAECALIS (A)  Final      Susceptibility   Enterococcus faecalis - MIC*    AMPICILLIN <=2 SENSITIVE Sensitive     NITROFURANTOIN <=16 SENSITIVE Sensitive     VANCOMYCIN 1 SENSITIVE Sensitive     * >=100,000 COLONIES/mL ENTEROCOCCUS FAECALIS  Resp Panel by RT-PCR (Flu A&B, Covid) Nasopharyngeal Swab     Status: None   Collection Time: 03/05/21  6:13 PM   Specimen: Nasopharyngeal Swab; Nasopharyngeal(NP) swabs in vial transport medium  Result Value Ref Range Status   SARS Coronavirus 2 by RT PCR NEGATIVE NEGATIVE Final    Comment: (NOTE) SARS-CoV-2 target nucleic acids are NOT DETECTED.  The SARS-CoV-2 RNA is generally detectable in upper respiratory specimens during the acute phase of infection. The lowest concentration of SARS-CoV-2 viral copies this assay can detect is 138 copies/mL. A  negative result does not preclude SARS-Cov-2 infection and should not be used as the sole basis for treatment or other patient management decisions. A negative result may occur with  improper specimen collection/handling, submission of specimen other than nasopharyngeal swab, presence of viral mutation(s) within the areas targeted by this assay, and inadequate number of viral copies(<138 copies/mL). A negative result must be combined with clinical observations, patient history, and epidemiological information. The expected result is Negative.  Fact Sheet for Patients:  EntrepreneurPulse.com.au  Fact Sheet for Healthcare Providers:  IncredibleEmployment.be  This test  is no t yet approved or cleared by the [place] and  has been authorized for detection and/or diagnosis of SARS-CoV-2 by FDA under an Emergency Use Authorization (EUA). This EUA will remain  in effect (meaning this test can be used) for the duration of the COVID-19 declaration under Section 564(b)(1) of the Act, 21 U.S.C.section 360bbb-3(b)(1), unless the authorization is terminated  or revoked sooner.       Influenza A by PCR NEGATIVE NEGATIVE Final   Influenza B by PCR NEGATIVE NEGATIVE Final    Comment: (NOTE) The Xpert Xpress SARS-CoV-2/FLU/RSV plus assay is intended as an aid in the diagnosis of influenza from Nasopharyngeal swab specimens and should not be used as a sole basis for treatment. Nasal washings and aspirates are unacceptable for Xpert Xpress SARS-CoV-2/FLU/RSV testing.  Fact Sheet for Patients: EntrepreneurPulse.com.au  Fact Sheet for Healthcare Providers: IncredibleEmployment.be  This test is not yet approved or cleared by the [place] FDA and has been authorized for detection and/or diagnosis of SARS-CoV-2 by FDA under an Emergency Use Authorization (EUA). This EUA will remain in effect (meaning this test can  be used) for the duration of the COVID-19 declaration under Section 564(b)(1) of the Act, 21 U.S.C. section 360bbb-3(b)(1), unless the authorization is terminated or revoked.  Performed at [facility], [place] [address], [place], [place] [address]      Labs: BNP (last 3 results) Recent Labs    03/04/21 1132  BNP [id number]   Basic Metabolic Panel: Recent Labs  Lab 03/04/21 1132 03/05/21 1500 03/06/21 0545 03/07/21 0334  NA 137 134* 136 136  K 3.7 4.0 4.2 4.0  CL 101 102 104 105  CO2 24 19* 20* 22  GLUCOSE 122* 132* 102* 158*  BUN 48* 60* 58* 57*  CREATININE 2.77* 3.20* 2.94* 2.69*  CALCIUM 9.2 9.0 9.2 8.5*   Liver Function Tests: No results for input(s): AST, ALT, ALKPHOS, BILITOT, PROT, ALBUMIN in the last 168 hours. No results for input(s): LIPASE, AMYLASE in the last 168 hours. No results for input(s): AMMONIA in the last 168 hours. CBC: Recent Labs  Lab 03/04/21 1132 03/05/21 1500 03/06/21 0545  WBC 5.7 5.9 5.9  NEUTROABS 4.3 4.2  --   HGB 12.3 12.1 12.7  HCT 38.4 37.2 40.0  MCV 81.2 80.5 81.3  PLT 254 230 316   Cardiac Enzymes: No results for input(s): CKTOTAL, CKMB, CKMBINDEX, TROPONINI in the last 168 hours. BNP: Invalid input(s): POCBNP CBG: Recent Labs  Lab 03/06/21 1721 03/06/21 2111 03/07/21 0625  GLUCAP 184* 145* 156*   D-Dimer No results for input(s): DDIMER in the last 72 hours. Hgb A1c No results for input(s): HGBA1C in the last 72 hours. Lipid Profile No results for input(s): CHOL, HDL, LDLCALC, TRIG, CHOLHDL, LDLDIRECT in the last 72 hours. Thyroid function studies No results for input(s): TSH, T4TOTAL, T3FREE, THYROIDAB in the last 72 hours.  Invalid input(s): FREET3 Anemia work up No results for input(s): VITAMINB12, FOLATE, FERRITIN, TIBC, IRON, RETICCTPCT in the last 72 hours. Urinalysis    Component Value Date/Time   COLORURINE YELLOW 03/04/2021 2147   APPEARANCEUR CLOUDY (A) 03/04/2021 2147   LABSPEC 1.025 03/04/2021  2147   PHURINE 5.5 03/04/2021 2147   GLUCOSEU NEGATIVE 03/04/2021 2147   HGBUR NEGATIVE 03/04/2021 2147   BILIRUBINUR NEGATIVE 03/04/2021 2147   KETONESUR NEGATIVE 03/04/2021 2147   PROTEINUR >300 (A) 03/04/2021 2147   NITRITE NEGATIVE 03/04/2021 2147   LEUKOCYTESUR LARGE (A) 03/04/2021 2147   Sepsis  Labs Invalid input(s): PROCALCITONIN,  WBC,  LACTICIDVEN Microbiology Recent Results (from the past 240 hour(s))  Urine Culture     Status: Abnormal   Collection Time: 03/04/21  9:47 PM   Specimen: Urine, Clean Catch  Result Value Ref Range Status   Specimen Description URINE, CLEAN CATCH  Final   Special Requests   Final    NONE Performed at [facility], [place] [address], [place], [place] [address]    Culture >=100,000 COLONIES/mL ENTEROCOCCUS FAECALIS (A)  Final   Report Status 03/07/2021 FINAL  Final   Organism ID, Bacteria ENTEROCOCCUS FAECALIS (A)  Final      Susceptibility   Enterococcus faecalis - MIC*    AMPICILLIN <=2 SENSITIVE Sensitive     NITROFURANTOIN <=16 SENSITIVE Sensitive     VANCOMYCIN 1 SENSITIVE Sensitive     * >=100,000 COLONIES/mL ENTEROCOCCUS FAECALIS  Resp Panel by RT-PCR (Flu A&B, Covid) Nasopharyngeal Swab     Status: None   Collection Time: 03/05/21  6:13 PM   Specimen: Nasopharyngeal Swab; Nasopharyngeal(NP) swabs in vial transport medium  Result Value Ref Range Status   SARS Coronavirus 2 by RT PCR NEGATIVE NEGATIVE Final    Comment: (NOTE) SARS-CoV-2 target nucleic acids are NOT DETECTED.  The SARS-CoV-2 RNA is generally detectable in upper respiratory specimens during the acute phase of infection. The lowest concentration of SARS-CoV-2 viral copies this assay can detect is 138 copies/mL. A negative result does not preclude SARS-Cov-2 infection and should not be used as the sole basis for treatment or other patient management decisions. A negative result may occur with  improper specimen collection/handling, submission of specimen  other than nasopharyngeal swab, presence of viral mutation(s) within the areas targeted by this assay, and inadequate number of viral copies(<138 copies/mL). A negative result must be combined with clinical observations, patient history, and epidemiological information. The expected result is Negative.  Fact Sheet for Patients:  EntrepreneurPulse.com.au  Fact Sheet for Healthcare Providers:  IncredibleEmployment.be  This test is no t yet approved or cleared by the [place] FDA and  has been authorized for detection and/or diagnosis of SARS-CoV-2 by FDA under an Emergency Use Authorization (EUA). This EUA will remain  in effect (meaning this test can be used) for the duration of the COVID-19 declaration under Section 564(b)(1) of the Act, 21 U.S.C.section 360bbb-3(b)(1), unless the authorization is terminated  or revoked sooner.       Influenza A by PCR NEGATIVE NEGATIVE Final   Influenza B by PCR NEGATIVE NEGATIVE Final    Comment: (NOTE) The Xpert Xpress SARS-CoV-2/FLU/RSV plus assay is intended as an aid in the diagnosis of influenza from Nasopharyngeal swab specimens and should not be used as a sole basis for treatment. Nasal washings and aspirates are unacceptable for Xpert Xpress SARS-CoV-2/FLU/RSV testing.  Fact Sheet for Patients: EntrepreneurPulse.com.au  Fact Sheet for Healthcare Providers: IncredibleEmployment.be  This test is not yet approved or cleared by the [place] FDA and has been authorized for detection and/or diagnosis of SARS-CoV-2 by FDA under an Emergency Use Authorization (EUA). This EUA will remain in effect (meaning this test can be used) for the duration of the COVID-19 declaration under Section 564(b)(1) of the Act, 21 U.S.C. section 360bbb-3(b)(1), unless the authorization is terminated or revoked.  Performed at [facility], [place] [address] [place],  [place] [address]      Time coordinating discharge: 45 minutes  SIGNED:   Tawni Millers, MD  [facility] 03/07/2021, 8:57  AM

## 2021-03-10 ENCOUNTER — Telehealth: Payer: Self-pay

## 2021-03-10 NOTE — Telephone Encounter (Signed)
Transition Care Management Follow-up Telephone Call Date of discharge and from where: 03/07/2021 from [facility] How have you been since you were released from the hospital? Pt stated that she was feeling okay. Pt stated that she had a headache but did not have any questions about the visit to [facility]  Any questions or concerns? No  Items Reviewed: Did the pt receive and understand the discharge instructions provided? Yes  Medications obtained and verified? Yes  Other? No  Any new allergies since your discharge? No  Dietary orders reviewed? No Do you have support at home? Yes   Functional Questionnaire: (I = Independent and D = Dependent) ADLs: I  Bathing/Dressing- I  Meal Prep- I  Eating- I  Maintaining continence- I  Transferring/Ambulation- I  Managing Meds- I   Follow up appointments reviewed:  PCP Hospital f/u appt confirmed? No  Pt has an appt scheduled but is calling to be seen sooner.  [facility] f/u appt confirmed? No   Are transportation arrangements needed? No  If their condition worsens, is the pt aware to call PCP or go to the Emergency Dept.? Yes Was the patient provided with contact information for the PCP's office or ED? Yes Was to pt encouraged to call back with questions or concerns? Yes

## 2021-03-12 ENCOUNTER — Ambulatory Visit (INDEPENDENT_AMBULATORY_CARE_PROVIDER_SITE_OTHER): Payer: Medicaid Other | Admitting: Cardiovascular Disease

## 2021-03-12 ENCOUNTER — Other Ambulatory Visit: Payer: Self-pay

## 2021-03-12 ENCOUNTER — Encounter (HOSPITAL_BASED_OUTPATIENT_CLINIC_OR_DEPARTMENT_OTHER): Payer: Self-pay | Admitting: Cardiovascular Disease

## 2021-03-12 VITALS — BP 163/76 | HR 62 | Ht 63.0 in | Wt 234.7 lb

## 2021-03-12 DIAGNOSIS — R079 Chest pain, unspecified: Secondary | ICD-10-CM | POA: Diagnosis not present

## 2021-03-12 DIAGNOSIS — G4730 Sleep apnea, unspecified: Secondary | ICD-10-CM

## 2021-03-12 DIAGNOSIS — I2583 Coronary atherosclerosis due to lipid rich plaque: Secondary | ICD-10-CM | POA: Diagnosis not present

## 2021-03-12 DIAGNOSIS — I10 Essential (primary) hypertension: Secondary | ICD-10-CM

## 2021-03-12 DIAGNOSIS — I5032 Chronic diastolic (congestive) heart failure: Secondary | ICD-10-CM

## 2021-03-12 DIAGNOSIS — I2510 Atherosclerotic heart disease of native coronary artery without angina pectoris: Secondary | ICD-10-CM

## 2021-03-12 MED ORDER — BUMETANIDE 1 MG PO TABS: ORAL_TABLET | ORAL | 1 refills | Status: DC

## 2021-03-12 MED ORDER — HYDRALAZINE HCL 100 MG PO TABS: 100.0000 mg | ORAL_TABLET | Freq: Three times a day (TID) | ORAL | 3 refills | Status: DC

## 2021-03-12 MED ORDER — DOXAZOSIN MESYLATE 2 MG PO TABS: 2.0000 mg | ORAL_TABLET | Freq: Every evening | ORAL | 3 refills | Status: DC

## 2021-03-12 NOTE — Assessment & Plan Note (Addendum)
[name]'s BP is very labile.  She was profoundly orthostatic in clinic with BP ranging from 209 lying down to 120s upon standing.  This is likely the cause of her dizziness.  We discussed the fact that it makes it more challenging to adjust her medications as they are more aggressive her dizziness will likely worsen.  She also has a lot of new symptoms of hot and cold intolerance.  In the office today she had on a sweater and a jacket despite that fact that nobody else was uncomfortable.  She reports palpitations and sudden hot flashes.  I am suspicious there may be an underlying endocrine cause.  We will have her come back and check a.m. cortisol levels, renin, aldosterone, catecholamines, metanephrines.  Her PTH levels have also been very elevated when last checked at [facility].  Calcium levels were recently normal.  She is suffers from severe constipation and abdominal pain.  Repeat PTH.  She has sleep apnea and her BiPAP has not been working for several months.  This is also contributing.  We will set her up with a sleep study.  It will be difficult to assess for renal artery stenosis with ultrasound due to body habitus.  Given her renal dysfunction, would prefer not to do a CT-A.  Increase doxazosin to 2 mg.

## 2021-03-12 NOTE — Assessment & Plan Note (Signed)
She appears to be euvolemic on exam.  Bumex was not listed on her medications at discharge but she reports that she is still taking it.  We will reorder and check a basic metabolic panel when she comes back.  Spironolactone was discontinued due to worsening renal function.  Blood pressure management as above.

## 2021-03-12 NOTE — Patient Instructions (Signed)
Medication Instructions:  INCREASE YOUR DOXAZOSIN TO 2 MG AT NIGHT   Labwork: PTH/RENIN/ALDOSTERONE/CATACHOLAMINES/METANEPHRINE/CORTISOL/TSH/BMET SOON FIRST THING IN MORNING    Testing/Procedures: Your physician has recommended that you have a sleep study. This test records several body functions during sleep, including: brain activity, eye movement, oxygen and carbon dioxide blood levels, heart rate and rhythm, breathing rate and rhythm, the flow of air through your mouth and nose, snoring, body muscle movements, and chest and belly movement. ONCE INSURANCE HAS BEEN REVIEWED THE OFFICE WILL CALL YOU TO ARRANGE    Follow-Up: 04/14/2021 AT 9:45 AM WITH DR [facility]    Special Instructions:  MONITOR YOUR BLOOD PRESSURE TWICE A DAY, LOG IN THE BOOK PROVIDED. BRING THE BOOK AND YOUR BLOOD PRESSURE MACHINE TO YOUR FOLLOW UP IN 1 MONTH   DASH Eating Plan DASH stands for "Dietary Approaches to Stop Hypertension." The DASH eating plan is a healthy eating plan that has been shown to reduce high blood pressure (hypertension). It may also reduce your risk for type 2 diabetes, heart disease, and stroke. The DASH eating plan may also help with weight loss. What are tips for following this plan?  General guidelines Avoid eating more than 2,300 mg (milligrams) of salt (sodium) a day. If you have hypertension, you may need to reduce your sodium intake to 1,500 mg a day. Limit alcohol intake to no more than 1 drink a day for nonpregnant women and 2 drinks a day for men. One drink equals 12 oz of beer, 5 oz of wine, or 1 oz of hard liquor. Work with your health care provider to maintain a healthy body weight or to lose weight. Ask what an ideal weight is for you. Get at least 30 minutes of exercise that causes your heart to beat faster (aerobic exercise) most days of the week. Activities may include walking, swimming, or biking. Work with your health care provider or diet and nutrition specialist (dietitian)  to adjust your eating plan to your individual calorie needs. Reading food labels  Check food labels for the amount of sodium per serving. Choose foods with less than 5 percent of the Daily Value of sodium. Generally, foods with less than 300 mg of sodium per serving fit into this eating plan. To find whole grains, look for the word "whole" as the first word in the ingredient list. Shopping Buy products labeled as "low-sodium" or "no salt added." Buy fresh foods. Avoid canned foods and premade or frozen meals. Cooking Avoid adding salt when cooking. Use salt-free seasonings or herbs instead of table salt or sea salt. Check with your health care provider or pharmacist before using salt substitutes. Do not fry foods. Cook foods using healthy methods such as baking, boiling, grilling, and broiling instead. Cook with heart-healthy oils, such as olive, canola, soybean, or sunflower oil. Meal planning Eat a balanced diet that includes: 5 or more servings of fruits and vegetables each day. At each meal, try to fill half of your plate with fruits and vegetables. Up to 6-8 servings of whole grains each day. Less than 6 oz of lean meat, poultry, or fish each day. A 3-oz serving of meat is about the same size as a deck of cards. One egg equals 1 oz. 2 servings of low-fat dairy each day. A serving of nuts, seeds, or beans 5 times each week. Heart-healthy fats. Healthy fats called Omega-3 fatty acids are found in foods such as flaxseeds and coldwater fish, like sardines, salmon, and mackerel. Limit how much  you eat of the following: Canned or prepackaged foods. Food that is high in trans fat, such as fried foods. Food that is high in saturated fat, such as fatty meat. Sweets, desserts, sugary drinks, and other foods with added sugar. Full-fat dairy products. Do not salt foods before eating. Try to eat at least 2 vegetarian meals each week. Eat more home-cooked food and less restaurant, buffet, and fast  food. When eating at a restaurant, ask that your food be prepared with less salt or no salt, if possible. What foods are recommended? The items listed may not be a complete list. Talk with your dietitian about what dietary choices are best for you. Grains Whole-grain or whole-wheat bread. Whole-grain or whole-wheat pasta. Brown rice. [place]. Bulgur. Whole-grain and low-sodium cereals. Pita bread. Low-fat, low-sodium crackers. Whole-wheat flour tortillas. Vegetables Fresh or frozen vegetables (raw, steamed, roasted, or grilled). Low-sodium or reduced-sodium tomato and vegetable juice. Low-sodium or reduced-sodium tomato sauce and tomato paste. Low-sodium or reduced-sodium canned vegetables. Fruits All fresh, dried, or frozen fruit. Canned fruit in natural juice (without added sugar). Meat and other protein foods Skinless chicken or [place]. Ground chicken or [place]. Pork with fat trimmed off. Fish and seafood. Egg whites. Dried beans, peas, or lentils. Unsalted nuts, nut butters, and seeds. Unsalted canned beans. Lean cuts of beef with fat trimmed off. Low-sodium, lean deli meat. Dairy Low-fat (1%) or fat-free (skim) milk. Fat-free, low-fat, or reduced-fat cheeses. Nonfat, low-sodium ricotta or cottage cheese. Low-fat or nonfat yogurt. Low-fat, low-sodium cheese. Fats and oils Soft margarine without trans fats. Vegetable oil. Low-fat, reduced-fat, or light mayonnaise and salad dressings (reduced-sodium). Canola, safflower, olive, soybean, and sunflower oils. Avocado. Seasoning and other foods Herbs. Spices. Seasoning mixes without salt. Unsalted popcorn and pretzels. Fat-free sweets. What foods are not recommended? The items listed may not be a complete list. Talk with your dietitian about what dietary choices are best for you. Grains Baked goods made with fat, such as croissants, muffins, or some breads. Dry pasta or rice meal packs. Vegetables Creamed or fried vegetables. Vegetables  in a cheese sauce. Regular canned vegetables (not low-sodium or reduced-sodium). Regular canned tomato sauce and paste (not low-sodium or reduced-sodium). Regular tomato and vegetable juice (not low-sodium or reduced-sodium). Angie Fava. Olives. Fruits Canned fruit in a light or heavy syrup. Fried fruit. Fruit in cream or butter sauce. Meat and other protein foods Fatty cuts of meat. Ribs. Fried meat. Berniece Salines. Sausage. Bologna and other processed lunch meats. Salami. Fatback. Hotdogs. Bratwurst. Salted nuts and seeds. Canned beans with added salt. Canned or smoked fish. Whole eggs or egg yolks. Chicken or [place] with skin. Dairy Whole or 2% milk, cream, and half-and-half. Whole or full-fat cream cheese. Whole-fat or sweetened yogurt. Full-fat cheese. Nondairy creamers. Whipped toppings. Processed cheese and cheese spreads. Fats and oils Butter. Stick margarine. Lard. Shortening. Ghee. Bacon fat. Tropical oils, such as coconut, palm kernel, or palm oil. Seasoning and other foods Salted popcorn and pretzels. Onion salt, garlic salt, seasoned salt, table salt, and sea salt. Worcestershire sauce. Tartar sauce. Barbecue sauce. Teriyaki sauce. Soy sauce, including reduced-sodium. Steak sauce. Canned and packaged gravies. Fish sauce. Oyster sauce. Cocktail sauce. Horseradish that you find on the shelf. Ketchup. Mustard. Meat flavorings and tenderizers. Bouillon cubes. Hot sauce and Tabasco sauce. Premade or packaged marinades. Premade or packaged taco seasonings. Relishes. Regular salad dressings. Where to find more information: National Heart, Lung, and [place]: https://wilson-eaton.com/ American Heart Association: www.heart.org Summary The DASH eating plan is a  healthy eating plan that has been shown to reduce high blood pressure (hypertension). It may also reduce your risk for type 2 diabetes, heart disease, and stroke. With the DASH eating plan, you should limit salt (sodium) intake to 2,300 mg a day. If you  have hypertension, you may need to reduce your sodium intake to 1,500 mg a day. When on the DASH eating plan, aim to eat more fresh fruits and vegetables, whole grains, lean proteins, low-fat dairy, and heart-healthy fats. Work with your health care provider or diet and nutrition specialist (dietitian) to adjust your eating plan to your individual calorie needs. This information is not intended to replace advice given to you by your health care provider. Make sure you discuss any questions you have with your health care provider. Document Released: 05/21/2011 Document Revised: 05/14/2017 Document Reviewed: 05/25/2016 Elsevier Patient Education  2020 [employer].

## 2021-03-12 NOTE — Assessment & Plan Note (Signed)
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  She has constant chest pain that does not seem to be exertional.  High-sensitivity troponin was minimally elevated and flat in the hospital.  I suspect that her symptoms are more related to poorly controlled hypertension.  We do not have a lot of great options for ischemic evaluation.  CT and Lexiscan Myoview unlikely to be helpful given body habitus.  Avoid contrast given her renal dysfunction.  For now to continue to medically manage with aspirin, atorvastatin, carvedilol, and Imdur.

## 2021-03-12 NOTE — Progress Notes (Signed)
Advanced Hypertension Clinic Initial Assessment:    Date:  03/12/2021   ID:  [name], DOB 09-23-1960, MRN [id number]  PCP:  Lucianne Lei, MD  Cardiologist:  None  Nephrologist:  Referring MD: Tawni Millers*   CC: Hypertension  History of Present Illness:    Sita Mcguiness is a 60 y.o. female with a hx of chronic diastolic heart failure, diabetes mellitus, CKD 3, hypertension, and stroke, here to establish care in the Advanced Hypertension Clinic.  She was seen in the hospital 02/2021 with chest pain. Blood pressure on admission was 189/80. High sensitivity troponin was flat and was not consistent with acute coronary syndrome. Her EKG showed no ischemic changes. She was noted to have elevated creatinine. Echo at admission revealed LVEF 55-60% and mild LVH with no significant valvular disease. She had acute on chronic renal failure and spironolactone was discontinued. She was instructed to follow up with nephrology and with the advanced hypertension clinic.  She has a history of an MI.  Her last heart cath was 05/2013, at which time she had 20% LAD, 50 to 60% left circumflex, and 100% RPDA lesions with left-to-right collaterals.  Today, she is accompanied by her daughter. At home her blood pressure fluctuates severely, sometimes from 130s to 200s in a short time. Typically, she often sees readings of 200s/100s since prior to the onset of her symptoms. A month ago she felt at baseline. Overall, her symptoms began about two weeks ago. Every time she stands up she becomes severely lightheaded. While sitting she is not dizzy. Additionally, she has persistent headaches that she states are worse than her chest pain. Her chest feels "uncomfortable" all the time. Sometimes when she tries to do something it may become worse. Last Thursday her chest pain became severe with associated pain radiating up and down her arm and to her back. Just prior she had stood up to cook something for lunch.  After siting down again, she tried to eat but was unable to due to nausea. She took 4 aspirin and called an ambulance. For at least a week, she has been suffering from shortness of breath while resting and talking, as well as an intermittent cough. She also endorses palpitations that have been occurring for at least a year. Most of the time she feels very cold, but may suddenly become hot for no reason. Sometimes she develops diaphoresis despite feeling like she is freezing. She has mild swelling in her feet, which she notes is an improvement. For her diet she is frequently eating vegetables. She does not have a lot of salt in her diet as she does not like the taste. By cutting back on sweets she has successfully lost 35 pounds. Any coffee or diet soda she drinks will be decaffeinated. Current supplements include fish oil and tumeric. She was taking tylenol for pain management, but is fearful of taking too much. She takes her medication religiously three times a day. She snores and has a BiPAP, but she believes this needs to be adjusted. When she wears it she develops chest pain and is unable to wear the BiPAP. Of note, she reports having constipation and general difficulty with her bowels. She has gone up to 3 weeks without a bowel movement. Recently she moved to [place] from [place]. She denies any syncope, orthopnea, or PND.   Previous antihypertensives: Losartan - elevated creatinine  Past Medical History:  Diagnosis Date   CHF (congestive heart failure) (HCC)    Diabetes mellitus  without complication ([place])    Hypertension    Renal disorder    Stroke [facility])     History reviewed. No pertinent surgical history.  Current Medications: Current Meds  Medication Sig   allopurinol (ZYLOPRIM) 100 MG tablet Take 100 mg by mouth daily.   amLODipine (NORVASC) 10 MG tablet Take 10 mg by mouth every morning.   aspirin EC 81 MG tablet Take 81 mg by mouth daily. Swallow whole.   atorvastatin (LIPITOR) 20  MG tablet Take 20 mg by mouth every evening.   bumetanide (BUMEX) 1 MG tablet TAKE 1 AND 1/2 TABLETS TWICE A DAY   buPROPion (WELLBUTRIN) 100 MG tablet Take 100 mg by mouth 2 (two) times daily.   carvedilol (COREG) 25 MG tablet Take 1 tablet (25 mg total) by mouth 2 (two) times daily with a meal.   cloNIDine (CATAPRES) 0.2 MG tablet Take 0.4 mg by mouth 3 (three) times daily.   doxazosin (CARDURA) 2 MG tablet Take 1 tablet (2 mg total) by mouth every evening.   ferrous sulfate 325 (65 FE) MG tablet Take 325 mg by mouth 2 (two) times daily.   fluticasone (FLONASE) 50 MCG/ACT nasal spray Place 1 spray into both nostrils daily as needed for allergies or rhinitis.   hydrALAZINE (APRESOLINE) 100 MG tablet Take 1 tablet (100 mg total) by mouth 3 (three) times daily.   isosorbide mononitrate (IMDUR) 60 MG 24 hr tablet Take 60 mg by mouth daily.   loratadine (CLARITIN) 10 MG tablet Take 10 mg by mouth daily.   Multiple Vitamin (MULTIVITAMIN ADULT) TABS Take 1 tablet by mouth daily.   nitroGLYCERIN (NITROSTAT) 0.4 MG SL tablet Place 1 tablet (0.4 mg total) under the tongue every 5 (five) minutes as needed for chest pain.   NOVOLOG FLEXPEN 100 UNIT/ML FlexPen Inject 8 Units into the skin 3 (three) times daily with meals.   Omega-3 Fatty Acids (FISH OIL) 1000 MG CAPS Take 1 capsule by mouth daily.   polyethylene glycol (MIRALAX / GLYCOLAX) 17 g packet Take 17 g by mouth daily.   pregabalin (LYRICA) 25 MG capsule Take 25 mg by mouth 2 (two) times daily.   sertraline (ZOLOFT) 50 MG tablet Take 50 mg by mouth daily.   Turmeric 500 MG CAPS Take 1 capsule by mouth daily.   VICTOZA 18 MG/3ML SOPN Inject 1.8 mg into the skin daily.     Allergies:   Hydrocodone and Oxycodone-acetaminophen   Social History   Socioeconomic History   Marital status: Divorced    Spouse name: Not on file   Number of children: Not on file   Years of education: Not on file   Highest education level: Not on file  Occupational  History   Not on file  Tobacco Use   Smoking status: Never    Passive exposure: Never   Smokeless tobacco: Never  Vaping Use   Vaping Use: Never used  Substance and Sexual Activity   Alcohol use: Not Currently   Drug use: Not Currently   Sexual activity: Not on file  Other Topics Concern   Not on file  Social History Narrative   Not on file   Social Determinants of Health   Financial Resource Strain: Low Risk    Difficulty of Paying Living Expenses: Not hard at all  Food Insecurity: No Food Insecurity   Worried About Running Out of Food in the Last Year: Never true   [place] in the Last Year: Never true  Transportation Needs: No Data processing manager (Medical): No   Lack of Transportation (Non-Medical): No  Physical Activity: Not on file  Stress: Not on file  Social Connections: Not on file     Family History: The patient's family history includes Hypertension in her daughter, father, mother, sister, and son; Stroke in her mother.  ROS:   Please see the history of present illness.    (+) Lightheadedness/dizziness (+) Headaches (+) Chest pain (+) Nausea (+) Shortness of breath (+) Cough (+) Palpitations (+) Coldness with diaphoresis (+) Bilateral LE edema (+) Snores (+) Constipation All other systems reviewed and are negative.  EKGs/Labs/Other Studies Reviewed:    Echo 03/06/2021: 1. Left ventricular ejection fraction, by estimation, is 55 to 60%. The  left ventricle has normal function. The left ventricle has no regional  wall motion abnormalities. There is mild left ventricular hypertrophy.  Left ventricular diastolic parameters  are consistent with Grade I diastolic dysfunction (impaired relaxation).  Elevated left atrial pressure.   2. Right ventricular systolic function is normal. The right ventricular  size is normal.   3. Left atrial size was mildly dilated.   4. The mitral valve is normal in structure. Trivial mitral  valve  regurgitation. No evidence of mitral stenosis.   5. The aortic valve is tricuspid. Aortic valve regurgitation is not  visualized. No aortic stenosis is present.   6. The inferior vena cava is normal in size with greater than 50%  respiratory variability, suggesting right atrial pressure of 3 mmHg.   US Carotid 08/07/2020 (CE): RIGHT  CCA: mild smooth homogenous plaque noted   Bulb: mild smooth homogenous plaque noted   ICA: no  plaque noted   ECA: does not demonstrate elevated velocities at origin   LEFT  CCA: mild smooth homogenous plaque noted   Bulb: no  plaque noted   ICA: no  plaque noted   ECA: does not demonstrate elevated velocities at origin    CAROTID EVALUATION (VELOCITIES)  Site, right measurement and left measurement respectively in cm/sec:   Right:  CCA Proximal: 72/11  CCA Mid: 68/13  CCA Distal: 62/18  Bulb: 56/16  ICA Proximal: 100/26  ICA Mid: 96/33  ICA Distal: 95/34  ECA: 104/11  Vertebral: 50/21  (Antegrade)   Left:  CCA Proximal: 70/19  CCA Mid: 64/15  CCA Distal: 66/14  Bulb: 67/16  ICA Proximal: 90/32  ICA Mid: 83/32  ICA Distal: 74/27  ECA: 85/9  Vertebral: 47/15  (Antegrade)    PRIMARY CRITERIA SECONDARY CRITERIA  % Stenosis PSV (cm/sec) % Plaque estimate EDV (cm/sec) ICA/CCA PSV ratio  NORMAL <180 ZERO <40 cm/s <2.0  <50 <180 <50 <40 cm/s <2.0  50-69 180-230 >50 40-100 cm/s 2.0-4.0  >70 >230 >50 >100 cm/s >4.0  Subtotal occlusion Variable visible variable Variable  Occlusion Absent Visible, no detectable lumen Absent Zero   IMPRESSIONS:  1.  Atherosclerosis in bilateral cervical carotid arteries as described  above.  2.  R ICA: evidence of no stenosis by criteria.  3.  L ICA: evidence of no stenosis by criteria.  4.  Bilateral vertebral arteries demonstrate antegrade flow.   LHC 05/2013:  Ostium: normal Successful Catheter: 5 fr. JL3.5 Unsuccessful Catheters: none  LAD: 20% mid lesion   Circumflex: 50-60% in a  large branch of a large Ramus Intermedius; otherwise, no significant LCx disease   RCA: 30% mid; 100% rPDA with L-->R collaterals   EKG:   03/12/2021: Sinus rhythm. Rate 62 bpm.  LVH with repolarization abnormality. First degree AV block.  Recent Labs: 03/04/2021: B Natriuretic Peptide 66.2 03/06/2021: Hemoglobin 12.7; Platelets 316 03/07/2021: BUN 57; Creatinine, Ser 2.69; Potassium 4.0; Sodium 136   Recent Lipid Panel No results found for: CHOL, TRIG, HDL, CHOLHDL, VLDL, LDLCALC, LDLDIRECT  Physical Exam:   VS:  BP (!) 163/76 (BP Location: Left Arm, Patient Position: Sitting, Cuff Size: Large)   Pulse 62   Ht '5\' 3"'$  (1.6 m)   Wt 234 lb 11.2 oz (106.5 kg)   BMI 41.58 kg/m  , BMI Body mass index is 41.58 kg/m. GENERAL:  Well appearing HEENT: Pupils equal round and reactive, fundi not visualized, oral mucosa unremarkable NECK:  No jugular venous distention, waveform within normal limits, carotid upstroke brisk and symmetric, no bruits, no thyromegaly LUNGS:  Clear to auscultation bilaterally HEART:  RRR.  PMI not displaced or sustained,S1 and S2 within normal limits, no S3, no S4, no clicks, no rubs, no murmurs ABD:  Flat, positive bowel sounds normal in frequency in pitch, no bruits, no rebound, no guarding, no midline pulsatile mass, no hepatomegaly, no splenomegaly EXT:  2 plus pulses throughout, no edema, no cyanosis no clubbing SKIN:  No rashes no nodules NEURO:  Cranial nerves II through XII grossly intact, motor grossly intact throughout PSYCH:  Cognitively intact, oriented to person place and time   ASSESSMENT/PLAN:    Essential hypertension Ms. Maisano's BP is very labile.  She was profoundly orthostatic in clinic with BP ranging from 209 lying down to 120s upon standing.  This is likely the cause of her dizziness.  We discussed the fact that it makes it more challenging to adjust her medications as they are more aggressive her dizziness will likely worsen.  She also has a lot of  new symptoms of hot and cold intolerance.  In the office today she had on a sweater and a jacket despite that fact that nobody else was uncomfortable.  She reports palpitations and sudden hot flashes.  I am suspicious there may be an underlying endocrine cause.  We will have her come back and check a.m. cortisol levels, renin, aldosterone, catecholamines, metanephrines.  Her PTH levels have also been very elevated when last checked at [facility].  Calcium levels were recently normal.  She is suffers from severe constipation and abdominal pain.  Repeat PTH.  She has sleep apnea and her BiPAP has not been working for several months.  This is also contributing.  We will set her up with a sleep study.  It will be difficult to assess for renal artery stenosis with ultrasound due to body habitus.  Given her renal dysfunction, would prefer not to do a CT-A.   Chronic diastolic CHF (congestive heart failure) ([place]) She appears to be euvolemic on exam.  Bumex was not listed on her medications at discharge but she reports that she is still taking it.  We will reorder and check a basic metabolic panel when she comes back.  Spironolactone was discontinued due to worsening renal function.  Blood pressure management as above.   Screening for Secondary Hypertension:  Causes 03/12/2021  Drugs/Herbals Screened     - Comments minimal caffeine, limits sodium  Renovascular HTN Not Screened  Sleep Apnea Screened     - Comments Has a BiPAP.  Needs adjusting.  Thyroid Disease Screened     - Comments check TSH  Hyperaldosteronism Screened  Pheochromocytoma Screened  Cushing's Syndrome Screened    Relevant Labs/Studies: Basic Labs Latest Ref Rng &  Units 03/07/2021 03/06/2021 03/05/2021  Sodium 135 - 145 mmol/L 136 136 134(L)  Potassium 3.5 - 5.1 mmol/L 4.0 4.2 4.0  Creatinine 0.44 - 1.00 mg/dL 2.69(H) 2.94(H) 3.20(H)                     Disposition:    FU with Lilas Diefendorf C. Oval Linsey, MD, [facility] in 1 month.   Medication  Adjustments/Labs and Tests Ordered: Current medicines are reviewed at length with the patient today.  Concerns regarding medicines are outlined above.   Orders Placed This Encounter  Procedures   TSH   Aldosterone + renin activity w/ ratio   Basic metabolic panel   Catecholamines, Fractionated, Plasma   Metanephrines, plasma   Cortisol   PTH, intact (no Ca)   EKG 12-Lead   Split night study    Meds ordered this encounter  Medications   hydrALAZINE (APRESOLINE) 100 MG tablet    Sig: Take 1 tablet (100 mg total) by mouth 3 (three) times daily.    Dispense:  270 tablet    Refill:  3   doxazosin (CARDURA) 2 MG tablet    Sig: Take 1 tablet (2 mg total) by mouth every evening.    Dispense:  90 tablet    Refill:  3    NEW DOSE, D/C 1 MG RX   bumetanide (BUMEX) 1 MG tablet    Sig: TAKE 1 AND 1/2 TABLETS TWICE A DAY    Dispense:  270 tablet    Refill:  1     I,Mathew Stumpf,acting as a scribe for Skeet Latch, MD.,have documented all relevant documentation on the behalf of Skeet Latch, MD,as directed by  Skeet Latch, MD while in the presence of Skeet Latch, MD.  I, Harford Oval Linsey, MD have reviewed all documentation for this visit.  The documentation of the exam, diagnosis, procedures, and orders on 03/12/2021 are all accurate and complete.   Signed, Skeet Latch, MD  03/12/2021 4:52 PM    [place]

## 2021-03-13 ENCOUNTER — Encounter (HOSPITAL_BASED_OUTPATIENT_CLINIC_OR_DEPARTMENT_OTHER): Payer: Self-pay

## 2021-03-13 DIAGNOSIS — I10 Essential (primary) hypertension: Secondary | ICD-10-CM | POA: Diagnosis not present

## 2021-03-13 MED ORDER — ISOSORBIDE MONONITRATE ER 60 MG PO TB24: 60.0000 mg | ORAL_TABLET | Freq: Every day | ORAL | 3 refills | Status: DC

## 2021-03-15 DIAGNOSIS — Z419 Encounter for procedure for purposes other than remedying health state, unspecified: Secondary | ICD-10-CM | POA: Diagnosis not present

## 2021-03-18 LAB — CATECHOLAMINES, FRACTIONATED, PLASMA
Dopamine: <30 pg/mL (ref 0–48)
Epinephrine: <15 pg/mL (ref 0–62)
Norepinephrine: 129 pg/mL (ref 0–874)

## 2021-03-18 LAB — ALDOSTERONE + RENIN ACTIVITY W/ RATIO
ALDOS/RENIN RATIO: 6.2 (ref 0.0–30.0)
ALDOSTERONE: 6.1 ng/dL (ref 0.0–30.0)
Renin: 0.991 ng/mL/hr (ref 0.167–5.380)

## 2021-03-18 LAB — BASIC METABOLIC PANEL
BUN/Creatinine Ratio: 25 — ABNORMAL HIGH (ref 9–23)
BUN: 69 mg/dL — ABNORMAL HIGH (ref 6–24)
CO2: 19 mmol/L — ABNORMAL LOW (ref 20–29)
Calcium: 9.8 mg/dL (ref 8.7–10.2)
Chloride: 102 mmol/L (ref 96–106)
Creatinine, Ser: 2.73 mg/dL — ABNORMAL HIGH (ref 0.57–1.00)
Glucose: 163 mg/dL — ABNORMAL HIGH (ref 70–99)
Potassium: 4.1 mmol/L (ref 3.5–5.2)
Sodium: 139 mmol/L (ref 134–144)
eGFR: 19 mL/min/{1.73_m2} — ABNORMAL LOW (ref >59)

## 2021-03-18 LAB — TSH: TSH: 3.28 u[IU]/mL (ref 0.450–4.500)

## 2021-03-18 LAB — CORTISOL: Cortisol: 15.7 ug/dL

## 2021-03-18 LAB — METANEPHRINES, PLASMA
Metanephrine, Free: <10 pg/mL (ref 0.0–88.0)
Normetanephrine, Free: 15.6 pg/mL (ref 0.0–244.0)

## 2021-03-18 LAB — PARATHYROID HORMONE, INTACT (NO CA): PTH: 134 pg/mL — ABNORMAL HIGH (ref 15–65)

## 2021-03-24 DIAGNOSIS — E1169 Type 2 diabetes mellitus with other specified complication: Secondary | ICD-10-CM | POA: Diagnosis not present

## 2021-03-24 DIAGNOSIS — N184 Chronic kidney disease, stage 4 (severe): Secondary | ICD-10-CM | POA: Diagnosis not present

## 2021-03-24 DIAGNOSIS — I1310 Hypertensive heart and chronic kidney disease without heart failure, with stage 1 through stage 4 chronic kidney disease, or unspecified chronic kidney disease: Secondary | ICD-10-CM | POA: Diagnosis not present

## 2021-03-24 DIAGNOSIS — E1122 Type 2 diabetes mellitus with diabetic chronic kidney disease: Secondary | ICD-10-CM | POA: Diagnosis not present

## 2021-04-02 DIAGNOSIS — I10 Essential (primary) hypertension: Secondary | ICD-10-CM | POA: Diagnosis not present

## 2021-04-02 DIAGNOSIS — E1122 Type 2 diabetes mellitus with diabetic chronic kidney disease: Secondary | ICD-10-CM | POA: Diagnosis not present

## 2021-04-02 DIAGNOSIS — N1832 Chronic kidney disease, stage 3b: Secondary | ICD-10-CM | POA: Diagnosis not present

## 2021-04-02 DIAGNOSIS — I509 Heart failure, unspecified: Secondary | ICD-10-CM | POA: Diagnosis not present

## 2021-04-04 ENCOUNTER — Telehealth: Payer: Self-pay | Admitting: *Deleted

## 2021-04-04 NOTE — Telephone Encounter (Signed)
Prior Authorization for sleep study sent to [facility] Carleene Overlie) via web portal. Tracking Number [id number].

## 2021-04-09 ENCOUNTER — Other Ambulatory Visit: Payer: Self-pay | Admitting: Internal Medicine

## 2021-04-09 DIAGNOSIS — I10 Essential (primary) hypertension: Secondary | ICD-10-CM | POA: Diagnosis not present

## 2021-04-09 DIAGNOSIS — N1832 Chronic kidney disease, stage 3b: Secondary | ICD-10-CM | POA: Diagnosis not present

## 2021-04-09 DIAGNOSIS — D631 Anemia in chronic kidney disease: Secondary | ICD-10-CM | POA: Diagnosis not present

## 2021-04-09 DIAGNOSIS — E871 Hypo-osmolality and hyponatremia: Secondary | ICD-10-CM | POA: Diagnosis not present

## 2021-04-09 DIAGNOSIS — Z1231 Encounter for screening mammogram for malignant neoplasm of breast: Secondary | ICD-10-CM

## 2021-04-09 DIAGNOSIS — I639 Cerebral infarction, unspecified: Secondary | ICD-10-CM | POA: Diagnosis not present

## 2021-04-09 DIAGNOSIS — I129 Hypertensive chronic kidney disease with stage 1 through stage 4 chronic kidney disease, or unspecified chronic kidney disease: Secondary | ICD-10-CM | POA: Diagnosis not present

## 2021-04-09 DIAGNOSIS — I2510 Atherosclerotic heart disease of native coronary artery without angina pectoris: Secondary | ICD-10-CM | POA: Diagnosis not present

## 2021-04-09 DIAGNOSIS — I5030 Unspecified diastolic (congestive) heart failure: Secondary | ICD-10-CM | POA: Diagnosis not present

## 2021-04-09 DIAGNOSIS — F32A Depression, unspecified: Secondary | ICD-10-CM | POA: Diagnosis not present

## 2021-04-09 DIAGNOSIS — I509 Heart failure, unspecified: Secondary | ICD-10-CM | POA: Diagnosis not present

## 2021-04-09 DIAGNOSIS — N189 Chronic kidney disease, unspecified: Secondary | ICD-10-CM | POA: Diagnosis not present

## 2021-04-09 DIAGNOSIS — E119 Type 2 diabetes mellitus without complications: Secondary | ICD-10-CM | POA: Diagnosis not present

## 2021-04-09 DIAGNOSIS — Z1239 Encounter for other screening for malignant neoplasm of breast: Secondary | ICD-10-CM | POA: Diagnosis not present

## 2021-04-10 ENCOUNTER — Other Ambulatory Visit: Payer: Self-pay | Admitting: Nephrology

## 2021-04-10 DIAGNOSIS — Z1231 Encounter for screening mammogram for malignant neoplasm of breast: Secondary | ICD-10-CM | POA: Diagnosis not present

## 2021-04-10 DIAGNOSIS — N1832 Chronic kidney disease, stage 3b: Secondary | ICD-10-CM

## 2021-04-14 ENCOUNTER — Emergency Department (HOSPITAL_COMMUNITY): Payer: Medicaid Other

## 2021-04-14 ENCOUNTER — Encounter (HOSPITAL_BASED_OUTPATIENT_CLINIC_OR_DEPARTMENT_OTHER): Payer: Self-pay | Admitting: Cardiovascular Disease

## 2021-04-14 ENCOUNTER — Other Ambulatory Visit: Payer: Self-pay

## 2021-04-14 ENCOUNTER — Ambulatory Visit (INDEPENDENT_AMBULATORY_CARE_PROVIDER_SITE_OTHER): Payer: Medicaid Other | Admitting: Cardiovascular Disease

## 2021-04-14 ENCOUNTER — Inpatient Hospital Stay (HOSPITAL_COMMUNITY)
Admission: EM | Admit: 2021-04-14 | Discharge: 2021-04-20 | DRG: 291 | Disposition: A | Discharge status: Home / Self Care | Payer: Medicaid Other | Attending: Internal Medicine | Admitting: Internal Medicine

## 2021-04-14 DIAGNOSIS — E8809 Other disorders of plasma-protein metabolism, not elsewhere classified: Secondary | ICD-10-CM | POA: Diagnosis present

## 2021-04-14 DIAGNOSIS — Z7189 Other specified counseling: Secondary | ICD-10-CM | POA: Diagnosis not present

## 2021-04-14 DIAGNOSIS — I252 Old myocardial infarction: Secondary | ICD-10-CM | POA: Diagnosis not present

## 2021-04-14 DIAGNOSIS — I160 Hypertensive urgency: Secondary | ICD-10-CM | POA: Diagnosis not present

## 2021-04-14 DIAGNOSIS — R001 Bradycardia, unspecified: Secondary | ICD-10-CM | POA: Diagnosis present

## 2021-04-14 DIAGNOSIS — I2510 Atherosclerotic heart disease of native coronary artery without angina pectoris: Secondary | ICD-10-CM | POA: Diagnosis present

## 2021-04-14 DIAGNOSIS — I517 Cardiomegaly: Secondary | ICD-10-CM | POA: Diagnosis not present

## 2021-04-14 DIAGNOSIS — N184 Chronic kidney disease, stage 4 (severe): Secondary | ICD-10-CM | POA: Diagnosis present

## 2021-04-14 DIAGNOSIS — I159 Secondary hypertension, unspecified: Secondary | ICD-10-CM | POA: Diagnosis not present

## 2021-04-14 DIAGNOSIS — I5033 Acute on chronic diastolic (congestive) heart failure: Secondary | ICD-10-CM

## 2021-04-14 DIAGNOSIS — E1122 Type 2 diabetes mellitus with diabetic chronic kidney disease: Secondary | ICD-10-CM | POA: Diagnosis present

## 2021-04-14 DIAGNOSIS — N189 Chronic kidney disease, unspecified: Secondary | ICD-10-CM | POA: Diagnosis not present

## 2021-04-14 DIAGNOSIS — E1165 Type 2 diabetes mellitus with hyperglycemia: Secondary | ICD-10-CM | POA: Diagnosis not present

## 2021-04-14 DIAGNOSIS — I10 Essential (primary) hypertension: Secondary | ICD-10-CM | POA: Diagnosis not present

## 2021-04-14 DIAGNOSIS — K5900 Constipation, unspecified: Secondary | ICD-10-CM | POA: Diagnosis present

## 2021-04-14 DIAGNOSIS — Z419 Encounter for procedure for purposes other than remedying health state, unspecified: Secondary | ICD-10-CM | POA: Diagnosis not present

## 2021-04-14 DIAGNOSIS — E876 Hypokalemia: Secondary | ICD-10-CM | POA: Diagnosis present

## 2021-04-14 DIAGNOSIS — Z20822 Contact with and (suspected) exposure to covid-19: Secondary | ICD-10-CM | POA: Diagnosis present

## 2021-04-14 DIAGNOSIS — M109 Gout, unspecified: Secondary | ICD-10-CM | POA: Diagnosis present

## 2021-04-14 DIAGNOSIS — Z66 Do not resuscitate: Secondary | ICD-10-CM | POA: Diagnosis not present

## 2021-04-14 DIAGNOSIS — F32A Depression, unspecified: Secondary | ICD-10-CM | POA: Diagnosis not present

## 2021-04-14 DIAGNOSIS — Z8249 Family history of ischemic heart disease and other diseases of the circulatory system: Secondary | ICD-10-CM

## 2021-04-14 DIAGNOSIS — E1169 Type 2 diabetes mellitus with other specified complication: Secondary | ICD-10-CM | POA: Diagnosis not present

## 2021-04-14 DIAGNOSIS — Z6841 Body Mass Index (BMI) 40.0 and over, adult: Secondary | ICD-10-CM

## 2021-04-14 DIAGNOSIS — N179 Acute kidney failure, unspecified: Secondary | ICD-10-CM | POA: Diagnosis not present

## 2021-04-14 DIAGNOSIS — R7989 Other specified abnormal findings of blood chemistry: Secondary | ICD-10-CM | POA: Diagnosis present

## 2021-04-14 DIAGNOSIS — E669 Obesity, unspecified: Secondary | ICD-10-CM | POA: Diagnosis not present

## 2021-04-14 DIAGNOSIS — I509 Heart failure, unspecified: Secondary | ICD-10-CM | POA: Diagnosis not present

## 2021-04-14 DIAGNOSIS — Z794 Long term (current) use of insulin: Secondary | ICD-10-CM | POA: Diagnosis not present

## 2021-04-14 DIAGNOSIS — I130 Hypertensive heart and chronic kidney disease with heart failure and stage 1 through stage 4 chronic kidney disease, or unspecified chronic kidney disease: Principal | ICD-10-CM | POA: Diagnosis present

## 2021-04-14 DIAGNOSIS — R0602 Shortness of breath: Secondary | ICD-10-CM | POA: Diagnosis not present

## 2021-04-14 DIAGNOSIS — K219 Gastro-esophageal reflux disease without esophagitis: Secondary | ICD-10-CM | POA: Diagnosis present

## 2021-04-14 DIAGNOSIS — Z823 Family history of stroke: Secondary | ICD-10-CM | POA: Diagnosis not present

## 2021-04-14 DIAGNOSIS — I110 Hypertensive heart disease with heart failure: Secondary | ICD-10-CM | POA: Diagnosis not present

## 2021-04-14 DIAGNOSIS — D631 Anemia in chronic kidney disease: Secondary | ICD-10-CM | POA: Diagnosis not present

## 2021-04-14 DIAGNOSIS — N1832 Chronic kidney disease, stage 3b: Secondary | ICD-10-CM | POA: Diagnosis not present

## 2021-04-14 DIAGNOSIS — Z8673 Personal history of transient ischemic attack (TIA), and cerebral infarction without residual deficits: Secondary | ICD-10-CM

## 2021-04-14 DIAGNOSIS — Z885 Allergy status to narcotic agent status: Secondary | ICD-10-CM

## 2021-04-14 DIAGNOSIS — Z7982 Long term (current) use of aspirin: Secondary | ICD-10-CM

## 2021-04-14 DIAGNOSIS — Z9189 Other specified personal risk factors, not elsewhere classified: Secondary | ICD-10-CM | POA: Diagnosis not present

## 2021-04-14 DIAGNOSIS — Z79899 Other long term (current) drug therapy: Secondary | ICD-10-CM

## 2021-04-14 HISTORY — DX: Hypertensive urgency: I16.0

## 2021-04-14 LAB — BASIC METABOLIC PANEL
Anion gap: 7 (ref 5–15)
BUN: 45 mg/dL — ABNORMAL HIGH (ref 6–20)
CO2: 23 mmol/L (ref 22–32)
Calcium: 8.2 mg/dL — ABNORMAL LOW (ref 8.9–10.3)
Chloride: 110 mmol/L (ref 98–111)
Creatinine, Ser: 1.96 mg/dL — ABNORMAL HIGH (ref 0.44–1.00)
GFR, Estimated: 29 mL/min — ABNORMAL LOW (ref >60)
Glucose, Bld: 93 mg/dL (ref 70–99)
Potassium: 4.1 mmol/L (ref 3.5–5.1)
Sodium: 140 mmol/L (ref 135–145)

## 2021-04-14 LAB — CBC
HCT: 30.7 % — ABNORMAL LOW (ref 36.0–46.0)
Hemoglobin: 10.2 g/dL — ABNORMAL LOW (ref 12.0–15.0)
MCH: 27.6 pg (ref 26.0–34.0)
MCHC: 33.2 g/dL (ref 30.0–36.0)
MCV: 83.2 fL (ref 80.0–100.0)
Platelets: 270 10*3/uL (ref 150–400)
RBC: 3.69 MIL/uL — ABNORMAL LOW (ref 3.87–5.11)
RDW: 18.6 % — ABNORMAL HIGH (ref 11.5–15.5)
WBC: 7.7 10*3/uL (ref 4.0–10.5)
nRBC: 0 % (ref 0.0–0.2)

## 2021-04-14 LAB — HEMOGLOBIN A1C
Hgb A1c MFr Bld: 6.7 % — ABNORMAL HIGH (ref 4.8–5.6)
Mean Plasma Glucose: 145.59 mg/dL

## 2021-04-14 LAB — BRAIN NATRIURETIC PEPTIDE: B Natriuretic Peptide: 97.1 pg/mL (ref 0.0–100.0)

## 2021-04-14 LAB — RESP PANEL BY RT-PCR (FLU A&B, COVID) ARPGX2
Influenza A by PCR: NEGATIVE
Influenza B by PCR: NEGATIVE
SARS Coronavirus 2 by RT PCR: NEGATIVE

## 2021-04-14 MED ORDER — INSULIN ASPART 100 UNIT/ML IJ SOLN: 0.0000 [IU] | Freq: Three times a day (TID) | INTRAMUSCULAR | Status: DC

## 2021-04-14 MED ORDER — INSULIN ASPART 100 UNIT/ML IJ SOLN: 0.0000 [IU] | Freq: Every day | INTRAMUSCULAR | Status: DC

## 2021-04-14 NOTE — H&P (Signed)
TRH H&P    Patient Demographics:    [facility], is a 60 y.o. female  MRN: [id number]  DOB - 17-Nov-1960  Admit Date - 04/14/2021  Referring MD/NP/PA: Dr. Alvino Chapel  Outpatient Primary MD for the patient is Nolene Ebbs, MD  Patient coming from: Cardiology hypertension clinic  Chief complaint-weight gain, high blood pressure   HPI:    [facility]  is a 60 y.o. female, with history of chronic diastolic CHF, diabetes mellitus type 2, CKD stage III, hypertension, stroke who was seen at cardiology hypertension clinic, was found to have 18 pound weight gain along with uncontrolled blood pressure and was sent to the ED for further evaluation. Patient has diastolic heart failure with EF 45 to 60%.  She has a history of MI, last cardiac cath was in December 2014 which showed 20% LAD, 5060% left circumflex 100% RPDA lesion with left-to-right collaterals. Patient told them that she had gained 18 pounds in last 2.5 weeks.  She was started on metolazone 2 days ago and has been taking 2 tablets twice a day before diuretics.  She also has dyspnea on exertion.  Also complains of orthopnea.  Worsening lower extremity edema. Patient was sent to the ED for admission for IV diuretics. She complains of epigastric pain Also complains of intermittent chest pain Denies nausea vomiting or diarrhea    Review of systems:    In addition to the HPI above,    All other systems reviewed and are negative.    Past History of the following :    Past Medical History:  Diagnosis Date   Acute on chronic diastolic heart failure ([place]) 03/05/2021   CHF (congestive heart failure) (HCC)    CKD (chronic kidney disease) stage 4, GFR 15-29 ml/min ([place]) 03/05/2021   Diabetes mellitus without complication ([place])    Hypertension    Hypertensive urgency 04/14/2021   Renal disorder    Stroke [facility])       No past surgical history on  file.    Social History:      Social History   Tobacco Use   Smoking status: Never    Passive exposure: Never   Smokeless tobacco: Never  Substance Use Topics   Alcohol use: Not Currently       Family History :     Family History  Problem Relation Age of Onset   Hypertension Mother    Stroke Mother    Hypertension Father    Hypertension Sister    Hypertension Son    Hypertension Daughter       Home Medications:   Prior to Admission medications   Medication Sig Start Date End Date Taking? Authorizing Provider  allopurinol (ZYLOPRIM) 100 MG tablet Take 100 mg by mouth daily. 01/27/21   [provider]  amLODipine (NORVASC) 10 MG tablet Take 10 mg by mouth every morning. 02/03/21   [provider]  aspirin EC 81 MG tablet Take 81 mg by mouth daily. Swallow whole.    [provider]  atorvastatin (LIPITOR) 20 MG tablet  Take 20 mg by mouth every evening. 03/02/21   [provider]  bumetanide (BUMEX) 1 MG tablet TAKE 1 AND 1/2 TABLETS TWICE A DAY 03/12/21   Skeet Latch, MD  buPROPion [facility]) 100 MG tablet Take 100 mg by mouth 2 (two) times daily. 02/04/21   [provider]  carvedilol (COREG) 25 MG tablet Take 1 tablet (25 mg total) by mouth 2 (two) times daily with a meal. 03/07/21 04/06/21  [name], [name], MD  cloNIDine (CATAPRES) 0.2 MG tablet Take 0.4 mg by mouth 3 (three) times daily. 02/28/21   [provider]  doxazosin (CARDURA) 2 MG tablet Take 1 tablet (2 mg total) by mouth every evening. 03/12/21   Skeet Latch, MD  ferrous sulfate 325 (65 FE) MG tablet Take 325 mg by mouth 2 (two) times daily. 02/24/21   [provider]  fluticasone (FLONASE) 50 MCG/ACT nasal spray Place 1 spray into both nostrils daily as needed for allergies or rhinitis.    [provider]  hydrALAZINE (APRESOLINE) 100 MG tablet Take 1 tablet (100 mg total) by mouth 3 (three) times daily. 03/12/21   Skeet Latch, MD  isosorbide mononitrate (IMDUR) 60 MG 24 hr tablet Take 1 tablet (60 mg total) by mouth daily. 03/13/21   Skeet Latch, MD  loratadine (CLARITIN) 10 MG tablet Take 10 mg by mouth daily. 02/14/21   [provider]  metolazone (ZAROXOLYN) 2.5 MG tablet Take 5 mg by mouth 2 (two) times daily. For 4 days    [provider]  Multiple Vitamin (MULTIVITAMIN ADULT) TABS Take 1 tablet by mouth daily.    [provider]  nitroGLYCERIN (NITROSTAT) 0.4 MG SL tablet Place 1 tablet (0.4 mg total) under the tongue every 5 (five) minutes as needed for chest pain. 03/07/21   [name], [name], MD  NOVOLOG FLEXPEN 100 UNIT/ML FlexPen Inject 8 Units into the skin 3 (three) times daily with meals. 01/12/21   [provider]  Omega-3 Fatty Acids (FISH OIL) 1000 MG CAPS Take 1 capsule by mouth daily.    [provider]  polyethylene glycol (MIRALAX / GLYCOLAX) 17 g packet Take 17 g by mouth daily. 03/07/21   [name], [name], MD  pregabalin (LYRICA) 25 MG capsule Take 25 mg by mouth 2 (two) times daily. 02/03/21   [provider]  sertraline (ZOLOFT) 50 MG tablet Take 50 mg by mouth daily. 02/01/21   [provider]  Turmeric 500 MG CAPS Take 1 capsule by mouth daily.    [provider]  VICTOZA 18 MG/3ML SOPN Inject 1.8 mg into the skin daily. 01/27/21   [provider]     Allergies:     Allergies  Allergen Reactions   Hydrocodone Itching    Patient able to tolerate when taken with Benadryl.   Oxycodone-Acetaminophen Itching    Patient able to tolerate when taken with Benadryl.     Physical Exam:   Vitals  Blood pressure (!) 175/68, pulse 65, temperature 98.5 F (36.9 C), temperature source Oral, resp. rate 18, SpO2 100 %.  1.  General: Appears in no acute distress  2. Psychiatric: Alert, oriented x3, intact insight and judgment  3. Neurologic: Cranial nerves II through XII grossly intact, no  focal deficit noted  4. HEENMT:  Atraumatic normocephalic, extraocular muscles are intact  5. Respiratory : Clear to auscultation bilaterally  6. Cardiovascular : S1-S2, regular, no murmur auscultated, bilateral 3+ pitting edema in the lower extremities  7.  Gastrointestinal:  Abdomen is soft, nontender, no organomegaly  8. Skin:  No rashes noted      Data Review:    CBC Recent Labs  Lab 04/14/21 1219  WBC 7.7  HGB 10.2*  HCT 30.7*  PLT 270  MCV 83.2  MCH 27.6  MCHC 33.2  RDW 18.6*   ------------------------------------------------------------------------------------------------------------------  Results for orders placed or performed during the hospital encounter of 04/14/21 (from the past 48 hour(s))  Basic metabolic panel     Status: Abnormal   Collection Time: 04/14/21 12:19 PM  Result Value Ref Range   Sodium 140 135 - 145 mmol/L   Potassium 4.1 3.5 - 5.1 mmol/L   Chloride 110 98 - 111 mmol/L   CO2 23 22 - 32 mmol/L   Glucose, Bld 93 70 - 99 mg/dL    Comment: Glucose reference range applies only to samples taken after fasting for at least 8 hours.   BUN 45 (H) 6 - 20 mg/dL   Creatinine, Ser 1.96 (H) 0.44 - 1.00 mg/dL   Calcium 8.2 (L) 8.9 - 10.3 mg/dL   GFR, Estimated 29 (L) >60 mL/min    Comment: (NOTE) Calculated using the CKD-EPI Creatinine Equation (2021)    Anion gap 7 5 - 15    Comment: Performed at [facility] [address] [place], [place] [address]  CBC     Status: Abnormal   Collection Time: 04/14/21 12:19 PM  Result Value Ref Range   WBC 7.7 4.0 - 10.5 K/uL   RBC 3.69 (L) 3.87 - 5.11 MIL/uL   Hemoglobin 10.2 (L) 12.0 - 15.0 g/dL   HCT 30.7 (L) 36.0 - 46.0 %   MCV 83.2 80.0 - 100.0 fL   MCH 27.6 26.0 - 34.0 pg   MCHC 33.2 30.0 - 36.0 g/dL   RDW 18.6 (H) 11.5 - 15.5 %   Platelets 270 150 - 400 K/uL   nRBC 0.0 0.0 - 0.2 %    Comment: Performed at [facility] [address] [place], [place] [address]  Brain  natriuretic peptide     Status: None   Collection Time: 04/14/21 12:19 PM  Result Value Ref Range   B Natriuretic Peptide 97.1 0.0 - 100.0 pg/mL    Comment: Performed at [facility] [address] [place], [place] [address]    Chemistries  Recent Labs  Lab 04/14/21 1219  NA 140  K 4.1  CL 110  CO2 23  GLUCOSE 93  BUN 45*  CREATININE 1.96*  CALCIUM 8.2*   ------------------------------------------------------------------------------------------------------------------  ------------------------------------------------------------------------------------------------------------------ GFR: Estimated Creatinine Clearance: 37.6 mL/min (A) (by C-G formula based on SCr of 1.96 mg/dL (H)). Liver Function Tests: No results for input(s): AST, ALT, ALKPHOS, BILITOT, PROT, ALBUMIN in the last 168 hours. No results for input(s): LIPASE, AMYLASE in the last 168 hours. No results for input(s): AMMONIA in the last 168 hours. Coagulation Profile: No results for input(s): INR, PROTIME in the last 168 hours. Cardiac Enzymes: No results for input(s): CKTOTAL, CKMB, CKMBINDEX, TROPONINI in the last 168 hours. BNP (last 3 results) No results for input(s): PROBNP in the last 8760 hours. HbA1C: No results for input(s): HGBA1C in the last 72 hours. CBG: No results for input(s): GLUCAP in the last 168 hours. Lipid Profile: No results for input(s): CHOL, HDL, LDLCALC, TRIG, CHOLHDL, LDLDIRECT in the last 72 hours. Thyroid Function Tests: No results for input(s): TSH, T4TOTAL, FREET4, T3FREE, THYROIDAB in the last 72 hours. Anemia Panel:  No results for input(s): VITAMINB12, FOLATE, FERRITIN, TIBC, IRON, RETICCTPCT in the last 72 hours.  --------------------------------------------------------------------------------------------------------------- Urine analysis:    Component Value Date/Time   COLORURINE YELLOW 03/04/2021 2147   APPEARANCEUR CLOUDY (A) 03/04/2021 2147   LABSPEC 1.025  03/04/2021 2147   PHURINE 5.5 03/04/2021 2147   GLUCOSEU NEGATIVE 03/04/2021 2147   HGBUR NEGATIVE 03/04/2021 2147   BILIRUBINUR NEGATIVE 03/04/2021 2147   KETONESUR NEGATIVE 03/04/2021 2147   PROTEINUR >300 (A) 03/04/2021 2147   NITRITE NEGATIVE 03/04/2021 2147   LEUKOCYTESUR LARGE (A) 03/04/2021 2147      Imaging Results:    DG Chest 2 View  Result Date: 04/14/2021 CLINICAL DATA:  shob EXAM: CHEST - 2 VIEW COMPARISON:  03/05/2021. FINDINGS: Similar cardiomediastinal silhouette, mildly enlarged. Chronic mild interstitial prominence without consolidation. No visible pleural effusions or pneumothorax. No acute osseous abnormality. IMPRESSION: 1. Chronic mild interstitial prominence, mild interstitial edema versus the sequela of recurrent bouts of CHF. No consolidation. 2. Mild cardiomegaly. Electronically Signed   By: Margaretha Sheffield M.D.   On: 04/14/2021 13:01    My personal review of EKG: Rhythm NSR, no ST-T changes   Assessment & Plan:    Active Problems:   Acute on chronic diastolic CHF (congestive heart failure) (HCC)   Acute on chronic diastolic CHF-patient presented with significant weight gain, will hold metolazone and Bumex.  Start Lasix 40 mg IV every 12 hours.  Follow strict intake and output.  Daily weights.  Cardiology will see patient in a.m. Uncontrolled hypertension-patient is on multiple antihypertensive medications, presented with uncontrolled hypertension.  At this time blood pressure is controlled with 175/68.  We will continue home medications including amlodipine, Coreg, Cardura, Catapres, Imdur, hydralazine. Diabetes mellitus type 2-we will start sliding scale insulin with NovoLog.  Check CBG before every meal and at bedtime. CKD stage IV-today creatinine is stable at 1.96.  Started on Lasix as above.  We will closely follow patient's BUNs/creatinine. GERD-we will start Protonix 40 mg p.o. daily Depression-continue Zoloft History of CAD-continue aspirin,  Coreg, Imdur, fish oil, statin   DVT Prophylaxis-   Lovenox   AM Labs Ordered, also please review Full Orders  Family Communication: Admission, patients condition and plan of care including tests being ordered have been discussed with the patient who indicate understanding and agree with the plan and Code Status.  Code Status:  DNR  Admission status: Observation/Inpatient :The appropriate admission status for this patient is INPATIENT. Inpatient status is judged to be reasonable and necessary in order to provide the required intensity of service to ensure the patient's safety. The patient's presenting symptoms, physical exam findings, and initial radiographic and laboratory data in the context of their chronic comorbidities is felt to place them at high risk for further clinical deterioration. Furthermore, it is not anticipated that the patient will be medically stable for discharge from the hospital within 2 midnights of admission. The following factors support the admission status of inpatient.     The patient's presenting symptoms include leg swelling The worrisome physical exam findings include leg edema The initial radiographic and laboratory data are worrisome because of acute on chronic diastolic CHF. The chronic co-morbidities include diabetes mellitus type 2.       * I certify that at the point of admission it is my clinical judgment that the patient will require inpatient hospital care spanning beyond 2 midnights from the point of admission due to high intensity of service, high risk for further deterioration and high frequency of  surveillance required.*  Time spent in minutes : 60 minutes   Omya Winfield S Kilee Hedding M.D

## 2021-04-14 NOTE — ED Notes (Signed)
Report called to RN receiving pt. 

## 2021-04-14 NOTE — Progress Notes (Signed)
  Pt sent from office. Please see Dr. Blenda Mounts note for recommendations.  Call cardiology for questions.  We will see tomorrow unless needed this evening.

## 2021-04-14 NOTE — ED Provider Notes (Signed)
[facility] Provider Note   CSN: [id number] Arrival date & time: 04/14/21  1147     History Chief Complaint  Patient presents with   Shortness of Breath    Maria Hudson is a 60 y.o. female.   Shortness of Breath Associated symptoms: no abdominal pain and no rash   Sent in by Dr. Oval Linsey for admission.  History of CHF and shortness of breath.  For the last week or 2 has been feeling more short of breath.  History of this acute on chronic diastolic heart failure.  Is up about 20 pounds over the last 2 weeks.  Has taken diuretics but still says she is short of breath.  More difficulty with exertion also.  Not really having chest pain.  No fevers.  Does have swelling in the legs.  No fevers.  Has a history of chronic kidney disease.  Sees nephrology and has had recent increase in her Bumex without relief of symptoms    Past Medical History:  Diagnosis Date   Acute on chronic diastolic heart failure ([place]) 03/05/2021   CHF (congestive heart failure) (HCC)    CKD (chronic kidney disease) stage 4, GFR 15-29 ml/min ([place]) 03/05/2021   Diabetes mellitus without complication ([place])    Hypertension    Hypertensive urgency 04/14/2021   Renal disorder    Stroke [facility])     Patient Active Problem List   Diagnosis Date Noted   Hypertensive urgency 04/14/2021   Chest pain 03/05/2021   CAD (coronary artery disease) 03/05/2021   T2DM (type 2 diabetes mellitus) ([place]) 03/05/2021   CKD (chronic kidney disease) stage 4, GFR 15-29 ml/min ([place]) 03/05/2021   Class 3 obesity ([place]) 03/05/2021   CVA (cerebral vascular accident) ([place]) 03/05/2021   Acute on chronic diastolic heart failure ([place]) 03/05/2021   Essential hypertension 03/05/2021   AKI (acute kidney injury) ([place]) 03/05/2021    No past surgical history on file.   OB History   No obstetric history on file.     Family History  Problem Relation Age of Onset   Hypertension Mother    Stroke Mother     Hypertension Father    Hypertension Sister    Hypertension Son    Hypertension Daughter     Social History   Tobacco Use   Smoking status: Never    Passive exposure: Never   Smokeless tobacco: Never  Vaping Use   Vaping Use: Never used  Substance Use Topics   Alcohol use: Not Currently   Drug use: Not Currently    Home Medications Prior to Admission medications   Medication Sig Start Date End Date Taking? Authorizing Provider  allopurinol (ZYLOPRIM) 100 MG tablet Take 100 mg by mouth daily. 01/27/21   [provider]  amLODipine (NORVASC) 10 MG tablet Take 10 mg by mouth every morning. 02/03/21   [provider]  aspirin EC 81 MG tablet Take 81 mg by mouth daily. Swallow whole.    [provider]  atorvastatin (LIPITOR) 20 MG tablet Take 20 mg by mouth every evening. 03/02/21   [provider]  bumetanide (BUMEX) 1 MG tablet TAKE 1 AND 1/2 TABLETS TWICE A DAY 03/12/21   Skeet Latch, MD  buPROPion [facility]) 100 MG tablet Take 100 mg by mouth 2 (two) times daily. 02/04/21   [provider]  carvedilol (COREG) 25 MG tablet Take 1 tablet (25 mg total) by mouth 2 (two) times daily with a meal. 03/07/21 04/06/21  [name], [name], MD  cloNIDine (CATAPRES) 0.2 MG tablet Take 0.4 mg by mouth 3 (three) times daily. 02/28/21   [provider]  doxazosin (CARDURA) 2 MG tablet Take 1 tablet (2 mg total) by mouth every evening. 03/12/21   Skeet Latch, MD  ferrous sulfate 325 (65 FE) MG tablet Take 325 mg by mouth 2 (two) times daily. 02/24/21   [provider]  fluticasone (FLONASE) 50 MCG/ACT nasal spray Place 1 spray into both nostrils daily as needed for allergies or rhinitis.    [provider]  hydrALAZINE (APRESOLINE) 100 MG tablet Take 1 tablet (100 mg total) by mouth 3 (three) times daily. 03/12/21   Skeet Latch, MD  isosorbide mononitrate (IMDUR) 60 MG 24 hr tablet Take 1 tablet (60 mg total) by  mouth daily. 03/13/21   Skeet Latch, MD  loratadine (CLARITIN) 10 MG tablet Take 10 mg by mouth daily. 02/14/21   [provider]  metolazone (ZAROXOLYN) 2.5 MG tablet Take 5 mg by mouth 2 (two) times daily. For 4 days    [provider]  Multiple Vitamin (MULTIVITAMIN ADULT) TABS Take 1 tablet by mouth daily.    [provider]  nitroGLYCERIN (NITROSTAT) 0.4 MG SL tablet Place 1 tablet (0.4 mg total) under the tongue every 5 (five) minutes as needed for chest pain. 03/07/21   [name], [name], MD  NOVOLOG FLEXPEN 100 UNIT/ML FlexPen Inject 8 Units into the skin 3 (three) times daily with meals. 01/12/21   [provider]  Omega-3 Fatty Acids (FISH OIL) 1000 MG CAPS Take 1 capsule by mouth daily.    [provider]  polyethylene glycol (MIRALAX / GLYCOLAX) 17 g packet Take 17 g by mouth daily. 03/07/21   [name], [name], MD  pregabalin (LYRICA) 25 MG capsule Take 25 mg by mouth 2 (two) times daily. 02/03/21   [provider]  sertraline (ZOLOFT) 50 MG tablet Take 50 mg by mouth daily. 02/01/21   [provider]  Turmeric 500 MG CAPS Take 1 capsule by mouth daily.    [provider]  VICTOZA 18 MG/3ML SOPN Inject 1.8 mg into the skin daily. 01/27/21   [provider]    Allergies    Hydrocodone and Oxycodone-acetaminophen  Review of Systems   Review of Systems  Constitutional:  Negative for appetite change.  HENT:  Negative for congestion.   Respiratory:  Positive for shortness of breath.   Cardiovascular:  Positive for leg swelling.  Gastrointestinal:  Negative for abdominal pain.  Genitourinary:  Negative for flank pain.  Musculoskeletal:  Negative for back pain.  Skin:  Negative for rash.  Neurological:  Negative for weakness.  Psychiatric/Behavioral:  Negative for confusion.    Physical Exam Updated Vital Signs BP (!) 175/68   Pulse 65   Temp 98.5 F (36.9 C) (Oral)   Resp 18   SpO2  100%   Physical Exam Vitals and nursing note reviewed.  HENT:     Head: Atraumatic.  Cardiovascular:     Rate and Rhythm: Regular rhythm.  Pulmonary:     Breath sounds: No wheezing or rhonchi.  Abdominal:     Tenderness: There is no abdominal tenderness.  Musculoskeletal:     Right lower leg: Edema present.     Left lower leg: Edema present.  Skin:    General: Skin is warm.     Capillary Refill: Capillary refill takes less than 2 seconds.  Neurological:     Mental Status:  She is alert and oriented to person, place, and time.    ED Results / Procedures / Treatments   Labs (all labs ordered are listed, but only abnormal results are displayed) Labs Reviewed  BASIC METABOLIC PANEL - Abnormal; Notable for the following components:      Result Value   BUN 45 (*)    Creatinine, Ser 1.96 (*)    Calcium 8.2 (*)    GFR, Estimated 29 (*)    All other components within normal limits  CBC - Abnormal; Notable for the following components:   RBC 3.69 (*)    Hemoglobin 10.2 (*)    HCT 30.7 (*)    RDW 18.6 (*)    All other components within normal limits  RESP PANEL BY RT-PCR (FLU A&B, COVID) [id number]  BRAIN NATRIURETIC PEPTIDE    EKG EKG Interpretation  Date/Time:  Monday April 14 2021 12:19:30 EDT Ventricular Rate:  76 PR Interval:  208 QRS Duration: 88 QT Interval:  410 QTC Calculation: 461 R Axis:   -15 Text Interpretation: Normal sinus rhythm Normal ECG Confirmed by Davonna Belling [phone]) on 04/14/2021 4:19:24 PM  Radiology DG Chest 2 View  Result Date: 04/14/2021 CLINICAL DATA:  shob EXAM: CHEST - 2 VIEW COMPARISON:  03/05/2021. FINDINGS: Similar cardiomediastinal silhouette, mildly enlarged. Chronic mild interstitial prominence without consolidation. No visible pleural effusions or pneumothorax. No acute osseous abnormality. IMPRESSION: 1. Chronic mild interstitial prominence, mild interstitial edema versus the sequela of recurrent bouts of CHF. No consolidation.  2. Mild cardiomegaly. Electronically Signed   By: Margaretha Sheffield M.D.   On: 04/14/2021 13:01    Procedures Procedures   Medications Ordered in ED Medications - No data to display  ED Course  I have reviewed the triage vital signs and the nursing notes.  Pertinent labs & imaging results that were available during my care of the patient were reviewed by me and considered in my medical decision making (see chart for details).    MDM Rules/Calculators/A&P                           Patient presents with shortness of breath.  Worsening CHF.  Sent in by hypertension clinic for admission.  Will require admission since has been on increasing oral diuretics without relief.  Will discuss with cardiology.  Cardiology requests medicine admission and they will be consultants Final Clinical Impression(s) / ED Diagnoses Final diagnoses:  Acute on chronic congestive heart failure, unspecified heart failure type [facility])    Rx / DC Orders ED Discharge Orders     None        Davonna Belling, MD 04/14/21 1704

## 2021-04-14 NOTE — ED Triage Notes (Signed)
Pt with hx of CHF here for eval of shob x 1 week and 20 lb weight gain in 2 weeks. Taking diuretics as prescribed. Endorses generalized chest soreness.

## 2021-04-14 NOTE — Patient Instructions (Addendum)
Medication Instructions:  NO CHANGES   Labwork: NONE  Testing/Procedures: NONE  Follow-Up: AFTER YOU ARE DISCHARGED FROM HOSPITAL   Any Other Special Instructions Will Be Listed Below (If Applicable).  GO TO [place] EMERGENCY DEPARTMENT FOR HEART FAILURE SYMPTOMS   If you need a refill on your cardiac medications before your next appointment, please call your pharmacy.

## 2021-04-14 NOTE — Assessment & Plan Note (Signed)
Blood pressure is extremely elevated today and has been at home.  She is also volume overloaded and has significant shortness of breath.  There is no room for titration of her any other antihypertensives at this time and this needs to be managed as an inpatient.  Continue amlodipine, carvedilol, clonidine, hydralazine, Imdur, and doxazosin.  In the future could consider increasing doxazosin and adding low-dose minoxidil.  However, will defer this to the inpatient team.  She does have hyperparathyroidism and chronic kidney disease which are likely contributing.

## 2021-04-14 NOTE — Assessment & Plan Note (Signed)
[facility] is very volume overloaded today.  Her weight is up 16 pounds from her last visit last month.  She saw her nephrologist, Dr. Justin Mend a couple days ago.  Despite increasing her Bumex and adding metolazone, she continues to gain weight.  Her blood pressure is 198/75.  I do not think that this can be managed safely as an outpatient, especially given her tenuous renal function.  I recommend that she be admitted to the hospital so that she can have IV diuretics and her renal function can be monitored closely.  This will also help [place] to determine her best antihypertensive regimen while inpatient.

## 2021-04-14 NOTE — Assessment & Plan Note (Signed)
She follows with nephrology.  Will need close management of her renal function while being diuresed in the hospital.

## 2021-04-14 NOTE — Progress Notes (Signed)
Advanced Hypertension Clinic Follow-up:    Date:  04/14/2021   ID:  Maria Hudson, DOB 1961/06/05, MRN [id number]  PCP:  Nolene Ebbs, MD  Cardiologist:  Skeet Latch, MD  Nephrologist:  Referring MD: Lucianne Lei, MD   CC: Hypertension  History of Present Illness:    Maria Hudson is a 60 y.o. female with a hx of chronic diastolic heart failure, diabetes mellitus, CKD 3, hypertension, and stroke, here for follow-up. She initially established care in the Advanced Hypertension Clinic 03/12/2021.  She was seen in the hospital 02/2021 with chest pain. Blood pressure on admission was 189/80. High sensitivity troponin was flat and was not consistent with acute coronary syndrome. Her EKG showed no ischemic changes. She was noted to have elevated creatinine. Echo at admission revealed LVEF 55-60% and mild LVH with no significant valvular disease. She had acute on chronic renal failure and spironolactone was discontinued. She was instructed to follow up with nephrology and with the advanced hypertension clinic.  She has a history of an MI.  Her last heart cath was 05/2013, at which time she had 20% LAD, 50 to 60% left circumflex, and 100% RPDA lesions with left-to-right collaterals.  At her last appointment, she reported labile hypertension, sometimes fluctuating from the 130s to the 200s in a short time. She reported difficulty wearing her BiPAP at night. She was referred for a sleep study. She had significant orthostasis in the office. Her blood pressure was 086 systolic lying down and dropped to 120 upon standing. Thyroid function, renin, and aldosterone were normal, as were catecholamines and metanephrines. Her PTH was elevated at 134 but calcium was normal. Doxazosin was increased to 2 mg at night.  Overall, she is not doing well overall. She has gained 18 pounds in the last 2.5 weeks likely due to fluid. Two days ago she was started on metolazone, and she has been taking 2 tablets twice a  day, before her diuretics. Lately her heart has been "beating very hard", and her blood pressure has been typically high per her blood pressure log. It is very difficult for her to breathe, and getting up or walking from the parking lot will cause her to be short-winded. She has an intermittent cough as well. Additionally, she has been suffering from sharp abdominal pains, and has not been eating much. She endorses bilateral LE edema to her thighs. She denies any lightheadedness, headaches, syncope, orthopnea, or PND.  Previous antihypertensives: Losartan - elevated creatinine  Past Medical History:  Diagnosis Date   Acute on chronic diastolic heart failure ([place]) 03/05/2021   CHF (congestive heart failure) (HCC)    CKD (chronic kidney disease) stage 4, GFR 15-29 ml/min ([place]) 03/05/2021   Diabetes mellitus without complication ([place])    Hypertension    Hypertensive urgency 04/14/2021   Renal disorder    Stroke [facility])     History reviewed. No pertinent surgical history.  Current Medications: Current Meds  Medication Sig   allopurinol (ZYLOPRIM) 100 MG tablet Take 100 mg by mouth daily.   amLODipine (NORVASC) 10 MG tablet Take 10 mg by mouth every morning.   aspirin EC 81 MG tablet Take 81 mg by mouth daily. Swallow whole.   atorvastatin (LIPITOR) 20 MG tablet Take 20 mg by mouth every evening.   bumetanide (BUMEX) 1 MG tablet TAKE 1 AND 1/2 TABLETS TWICE A DAY   buPROPion (WELLBUTRIN) 100 MG tablet Take 100 mg by mouth 2 (two) times daily.   cloNIDine (CATAPRES) 0.2 MG  tablet Take 0.4 mg by mouth 3 (three) times daily.   doxazosin (CARDURA) 2 MG tablet Take 1 tablet (2 mg total) by mouth every evening.   ferrous sulfate 325 (65 FE) MG tablet Take 325 mg by mouth 2 (two) times daily.   fluticasone (FLONASE) 50 MCG/ACT nasal spray Place 1 spray into both nostrils daily as needed for allergies or rhinitis.   hydrALAZINE (APRESOLINE) 100 MG tablet Take 1 tablet (100 mg total) by mouth 3 (three)  times daily.   isosorbide mononitrate (IMDUR) 60 MG 24 hr tablet Take 1 tablet (60 mg total) by mouth daily.   loratadine (CLARITIN) 10 MG tablet Take 10 mg by mouth daily.   metolazone (ZAROXOLYN) 2.5 MG tablet Take 5 mg by mouth 2 (two) times daily. For 4 days   Multiple Vitamin (MULTIVITAMIN ADULT) TABS Take 1 tablet by mouth daily.   nitroGLYCERIN (NITROSTAT) 0.4 MG SL tablet Place 1 tablet (0.4 mg total) under the tongue every 5 (five) minutes as needed for chest pain.   NOVOLOG FLEXPEN 100 UNIT/ML FlexPen Inject 8 Units into the skin 3 (three) times daily with meals.   Omega-3 Fatty Acids (FISH OIL) 1000 MG CAPS Take 1 capsule by mouth daily.   polyethylene glycol (MIRALAX / GLYCOLAX) 17 g packet Take 17 g by mouth daily.   pregabalin (LYRICA) 25 MG capsule Take 25 mg by mouth 2 (two) times daily.   sertraline (ZOLOFT) 50 MG tablet Take 50 mg by mouth daily.   Turmeric 500 MG CAPS Take 1 capsule by mouth daily.   VICTOZA 18 MG/3ML SOPN Inject 1.8 mg into the skin daily.     Allergies:   Hydrocodone and Oxycodone-acetaminophen   Social History   Socioeconomic History   Marital status: Divorced    Spouse name: Not on file   Number of children: Not on file   Years of education: Not on file   Highest education level: Not on file  Occupational History   Not on file  Tobacco Use   Smoking status: Never    Passive exposure: Never   Smokeless tobacco: Never  Vaping Use   Vaping Use: Never used  Substance and Sexual Activity   Alcohol use: Not Currently   Drug use: Not Currently   Sexual activity: Not on file  Other Topics Concern   Not on file  Social History Narrative   Not on file   Social Determinants of Health   Financial Resource Strain: Low Risk    Difficulty of Paying Living Expenses: Not hard at all  Food Insecurity: No Food Insecurity   Worried About Running Out of Food in the Last Year: Never true   [place] in the Last Year: Never true  Transportation  Needs: No Transportation Needs   Lack of Transportation (Medical): No   Lack of Transportation (Non-Medical): No  Physical Activity: Not on file  Stress: Not on file  Social Connections: Not on file     Family History: The patient's family history includes Hypertension in her daughter, father, mother, sister, and son; Stroke in her mother.  ROS:   Please see the history of present illness.    (+) Palpitations (+) Shortness of breath (+) Cough (+) Sharp abdominal pain (+) Bilateral LE edema All other systems reviewed and are negative.  EKGs/Labs/Other Studies Reviewed:    Echo 03/06/2021: 1. Left ventricular ejection fraction, by estimation, is 55 to 60%. The  left ventricle has normal function. The left ventricle  has no regional  wall motion abnormalities. There is mild left ventricular hypertrophy.  Left ventricular diastolic parameters  are consistent with Grade I diastolic dysfunction (impaired relaxation).  Elevated left atrial pressure.   2. Right ventricular systolic function is normal. The right ventricular  size is normal.   3. Left atrial size was mildly dilated.   4. The mitral valve is normal in structure. Trivial mitral valve  regurgitation. No evidence of mitral stenosis.   5. The aortic valve is tricuspid. Aortic valve regurgitation is not  visualized. No aortic stenosis is present.   6. The inferior vena cava is normal in size with greater than 50%  respiratory variability, suggesting right atrial pressure of 3 mmHg.   US Carotid 08/07/2020 (CE): RIGHT  CCA: mild smooth homogenous plaque noted   Bulb: mild smooth homogenous plaque noted   ICA: no  plaque noted   ECA: does not demonstrate elevated velocities at origin   LEFT  CCA: mild smooth homogenous plaque noted   Bulb: no  plaque noted   ICA: no  plaque noted   ECA: does not demonstrate elevated velocities at origin    CAROTID EVALUATION (VELOCITIES)  Site, right measurement and left measurement  respectively in cm/sec:   Right:  CCA Proximal: 72/11  CCA Mid: 68/13  CCA Distal: 62/18  Bulb: 56/16  ICA Proximal: 100/26  ICA Mid: 96/33  ICA Distal: 95/34  ECA: 104/11  Vertebral: 50/21  (Antegrade)   Left:  CCA Proximal: 70/19  CCA Mid: 64/15  CCA Distal: 66/14  Bulb: 67/16  ICA Proximal: 90/32  ICA Mid: 83/32  ICA Distal: 74/27  ECA: 85/9  Vertebral: 47/15  (Antegrade)    PRIMARY CRITERIA SECONDARY CRITERIA  % Stenosis PSV (cm/sec) % Plaque estimate EDV (cm/sec) ICA/CCA PSV ratio  NORMAL <180 ZERO <40 cm/s <2.0  <50 <180 <50 <40 cm/s <2.0  50-69 180-230 >50 40-100 cm/s 2.0-4.0  >70 >230 >50 >100 cm/s >4.0  Subtotal occlusion Variable visible variable Variable  Occlusion Absent Visible, no detectable lumen Absent Zero   IMPRESSIONS:  1.  Atherosclerosis in bilateral cervical carotid arteries as described  above.  2.  R ICA: evidence of no stenosis by criteria.  3.  L ICA: evidence of no stenosis by criteria.  4.  Bilateral vertebral arteries demonstrate antegrade flow.   LHC 05/2013:  Ostium: normal Successful Catheter: 5 fr. JL3.5 Unsuccessful Catheters: none  LAD: 20% mid lesion   Circumflex: 50-60% in a large branch of a large Ramus Intermedius; otherwise, no significant LCx disease   RCA: 30% mid; 100% rPDA with L-->R collaterals   EKG:   04/14/2021: EKG was not ordered today. 03/12/2021: Sinus rhythm. Rate 62 bpm. LVH with repolarization abnormality. First degree AV block.  Recent Labs: 03/04/2021: B Natriuretic Peptide 66.2 03/06/2021: Hemoglobin 12.7; Platelets 316 03/13/2021: BUN 69; Creatinine, Ser 2.73; Potassium 4.1; Sodium 139; TSH 3.280   Recent Lipid Panel No results found for: CHOL, TRIG, HDL, CHOLHDL, VLDL, LDLCALC, LDLDIRECT  Physical Exam:   VS:  BP (!) 188/94 (BP Location: Right Arm, Patient Position: Sitting, Cuff Size: Large)   Pulse 78   Ht 5\' 3"  (1.6 m)   Wt 251 lb 8 oz (114.1 kg)   SpO2 98%   BMI 44.55 kg/m  , BMI Body  mass index is 44.55 kg/m. GENERAL: Dyspneic while talking. HEENT: Pupils equal round and reactive, fundi not visualized, oral mucosa unremarkable NECK: JVP 2 cm above the clavicle at 45 degrees.  Waveform within normal limits, carotid upstroke brisk and symmetric, no bruits, no thyromegaly LUNGS:  Clear to auscultation bilaterally HEART:  RRR.  PMI not displaced or sustained,S1 and S2 within normal limits, no S3, no S4, no clicks, no rubs, no murmurs ABD:  Flat, positive bowel sounds normal in frequency in pitch, no bruits, no rebound, no guarding, no midline pulsatile mass, no hepatomegaly, no splenomegaly EXT:  2 plus pulses throughout, bilateral  2+ LE edema to upper tibia bilaterally. No cyanosis no clubbing SKIN:  No rashes no nodules NEURO:  Cranial nerves II through XII grossly intact, motor grossly intact throughout PSYCH:  Cognitively intact, oriented to person place and time   ASSESSMENT/PLAN:    Acute on chronic diastolic heart failure ([place]) Ms. Cedano is very volume overloaded today.  Her weight is up 16 pounds from her last visit last month.  She saw her nephrologist, Dr. Justin Mend a couple days ago.  Despite increasing her Bumex and adding metolazone, she continues to gain weight.  Her blood pressure is 198/75.  I do not think that this can be managed safely as an outpatient, especially given her tenuous renal function.  I recommend that she be admitted to the hospital so that she can have IV diuretics and her renal function can be monitored closely.  This will also help [place] to determine her best antihypertensive regimen while inpatient.  Hypertensive urgency Blood pressure is extremely elevated today and has been at home.  She is also volume overloaded and has significant shortness of breath.  There is no room for titration of her any other antihypertensives at this time and this needs to be managed as an inpatient.  Continue amlodipine, carvedilol, clonidine, hydralazine, Imdur, and  doxazosin.  In the future could consider increasing doxazosin and adding low-dose minoxidil.  However, will defer this to the inpatient team.  She does have hyperparathyroidism and chronic kidney disease which are likely contributing.  CKD (chronic kidney disease) stage 4, GFR 15-29 ml/min (HCC) She follows with nephrology.  Will need close management of her renal function while being diuresed in the hospital.   Screening for Secondary Hypertension:  Causes 03/12/2021  Drugs/Herbals Screened     - Comments minimal caffeine, limits sodium  Renovascular HTN Not Screened  Sleep Apnea Screened     - Comments Has a BiPAP.  Needs adjusting.  Thyroid Disease Screened     - Comments check TSH  Hyperaldosteronism Screened  Pheochromocytoma Screened  Cushing's Syndrome Screened    Relevant Labs/Studies: Basic Labs Latest Ref Rng & Units 03/13/2021 03/07/2021 03/06/2021  Sodium 134 - 144 mmol/L 139 136 136  Potassium 3.5 - 5.2 mmol/L 4.1 4.0 4.2  Creatinine 0.57 - 1.00 mg/dL 2.73(H) 2.69(H) 2.94(H)    Thyroid  Latest Ref Rng & Units 03/13/2021  TSH 0.450 - 4.500 uIU/mL 3.280    Renin/Aldosterone  Latest Ref Rng & Units 03/13/2021  Aldosterone 0.0 - 30.0 ng/dL 6.1  Renin 0.167 - 5.380 ng/mL/hr 0.991  Aldos/Renin Ratio 0.0 - 30.0 6.2    Metanephrines/Catecholamines  Latest Ref Rng & Units 03/13/2021  Epinephrine 0 - 62 pg/mL <15  Norepinephrine 0 - 874 pg/mL 129  Dopamine 0 - 48 pg/mL <30  Metanephrines 0.0 - 88.0 pg/mL <10.0  Normetanephrines  0.0 - 244.0 pg/mL 15.6            Disposition:    Recommended admittance to the hospital.  Follow up in my cardiology clinic (not ADV HTN) upon discharge from the  hospital.    Medication Adjustments/Labs and Tests Ordered: Current medicines are reviewed at length with the patient today.  Concerns regarding medicines are outlined above.   No orders of the defined types were placed in this encounter.   No orders of the defined types were placed  in this encounter.   I,Mathew Stumpf,acting as a Education administrator for Skeet Latch, MD.,have documented all relevant documentation on the behalf of Skeet Latch, MD,as directed by  Skeet Latch, MD while in the presence of Skeet Latch, MD.  I, [place] Oval Linsey, MD have reviewed all documentation for this visit.  The documentation of the exam, diagnosis, procedures, and orders on 04/14/2021 are all accurate and complete.   Signed, Skeet Latch, MD  04/14/2021 10:30 AM    [place]

## 2021-04-15 ENCOUNTER — Encounter (HOSPITAL_COMMUNITY): Payer: Self-pay | Admitting: Family Medicine

## 2021-04-15 DIAGNOSIS — D631 Anemia in chronic kidney disease: Secondary | ICD-10-CM

## 2021-04-15 DIAGNOSIS — E8809 Other disorders of plasma-protein metabolism, not elsewhere classified: Secondary | ICD-10-CM | POA: Diagnosis not present

## 2021-04-15 DIAGNOSIS — I5033 Acute on chronic diastolic (congestive) heart failure: Secondary | ICD-10-CM | POA: Diagnosis not present

## 2021-04-15 DIAGNOSIS — Z7189 Other specified counseling: Secondary | ICD-10-CM | POA: Diagnosis not present

## 2021-04-15 DIAGNOSIS — N189 Chronic kidney disease, unspecified: Secondary | ICD-10-CM | POA: Diagnosis not present

## 2021-04-15 DIAGNOSIS — N184 Chronic kidney disease, stage 4 (severe): Secondary | ICD-10-CM

## 2021-04-15 DIAGNOSIS — I2510 Atherosclerotic heart disease of native coronary artery without angina pectoris: Secondary | ICD-10-CM | POA: Diagnosis not present

## 2021-04-15 DIAGNOSIS — E1122 Type 2 diabetes mellitus with diabetic chronic kidney disease: Secondary | ICD-10-CM | POA: Diagnosis not present

## 2021-04-15 DIAGNOSIS — Z9189 Other specified personal risk factors, not elsewhere classified: Secondary | ICD-10-CM

## 2021-04-15 DIAGNOSIS — Z419 Encounter for procedure for purposes other than remedying health state, unspecified: Secondary | ICD-10-CM | POA: Diagnosis not present

## 2021-04-15 DIAGNOSIS — I10 Essential (primary) hypertension: Secondary | ICD-10-CM | POA: Diagnosis not present

## 2021-04-15 DIAGNOSIS — E1169 Type 2 diabetes mellitus with other specified complication: Secondary | ICD-10-CM

## 2021-04-15 DIAGNOSIS — Z794 Long term (current) use of insulin: Secondary | ICD-10-CM

## 2021-04-15 DIAGNOSIS — E876 Hypokalemia: Secondary | ICD-10-CM

## 2021-04-15 LAB — CBC
HCT: 30.6 % — ABNORMAL LOW (ref 36.0–46.0)
Hemoglobin: 10.1 g/dL — ABNORMAL LOW (ref 12.0–15.0)
MCH: 26.7 pg (ref 26.0–34.0)
MCHC: 33 g/dL (ref 30.0–36.0)
MCV: 81 fL (ref 80.0–100.0)
Platelets: 261 10*3/uL (ref 150–400)
RBC: 3.78 MIL/uL — ABNORMAL LOW (ref 3.87–5.11)
RDW: 18.4 % — ABNORMAL HIGH (ref 11.5–15.5)
WBC: 7.6 10*3/uL (ref 4.0–10.5)
nRBC: 0 % (ref 0.0–0.2)

## 2021-04-15 LAB — COMPREHENSIVE METABOLIC PANEL
ALT: 9 U/L (ref 0–44)
AST: 17 U/L (ref 15–41)
Albumin: 2.6 g/dL — ABNORMAL LOW (ref 3.5–5.0)
Alkaline Phosphatase: 42 U/L (ref 38–126)
Anion gap: 6 (ref 5–15)
BUN: 39 mg/dL — ABNORMAL HIGH (ref 6–20)
CO2: 21 mmol/L — ABNORMAL LOW (ref 22–32)
Calcium: 8.6 mg/dL — ABNORMAL LOW (ref 8.9–10.3)
Chloride: 111 mmol/L (ref 98–111)
Creatinine, Ser: 1.83 mg/dL — ABNORMAL HIGH (ref 0.44–1.00)
GFR, Estimated: 31 mL/min — ABNORMAL LOW (ref >60)
Glucose, Bld: 141 mg/dL — ABNORMAL HIGH (ref 70–99)
Potassium: 3.3 mmol/L — ABNORMAL LOW (ref 3.5–5.1)
Sodium: 138 mmol/L (ref 135–145)
Total Bilirubin: 0.7 mg/dL (ref 0.3–1.2)
Total Protein: 5.2 g/dL — ABNORMAL LOW (ref 6.5–8.1)

## 2021-04-15 LAB — GLUCOSE, CAPILLARY
Glucose-Capillary: 112 mg/dL — ABNORMAL HIGH (ref 70–99)
Glucose-Capillary: 115 mg/dL — ABNORMAL HIGH (ref 70–99)
Glucose-Capillary: 123 mg/dL — ABNORMAL HIGH (ref 70–99)
Glucose-Capillary: 148 mg/dL — ABNORMAL HIGH (ref 70–99)
Glucose-Capillary: 171 mg/dL — ABNORMAL HIGH (ref 70–99)

## 2021-04-15 LAB — HEMOGLOBIN A1C
Hgb A1c MFr Bld: 6.7 % — ABNORMAL HIGH (ref 4.8–5.6)
Mean Plasma Glucose: 145.59 mg/dL

## 2021-04-15 MED ORDER — INSULIN ASPART 100 UNIT/ML IJ SOLN
0.0000 [IU] | Freq: Every day | INTRAMUSCULAR | Status: DC
  Administered 2021-04-19: 2 [IU] via SUBCUTANEOUS

## 2021-04-15 MED ORDER — ISOSORBIDE MONONITRATE ER 60 MG PO TB24
60.0000 mg | ORAL_TABLET | Freq: Every day | ORAL | Status: DC
  Administered 2021-04-15: 60 mg via ORAL
  Filled 2021-04-15 (×2): qty 1

## 2021-04-15 MED ORDER — ENOXAPARIN SODIUM 60 MG/0.6ML IJ SOSY
55.0000 mg | PREFILLED_SYRINGE | Freq: Every day | INTRAMUSCULAR | Status: DC
  Administered 2021-04-15 – 2021-04-19 (×6): 55 mg via SUBCUTANEOUS
  Filled 2021-04-15 (×6): qty 0.6

## 2021-04-15 MED ORDER — FUROSEMIDE 10 MG/ML IJ SOLN
40.0000 mg | Freq: Two times a day (BID) | INTRAMUSCULAR | Status: DC
  Administered 2021-04-15 – 2021-04-18 (×8): 40 mg via INTRAVENOUS
  Filled 2021-04-15 (×8): qty 4

## 2021-04-15 MED ORDER — ONDANSETRON HCL 4 MG PO TABS: 4.0000 mg | ORAL_TABLET | Freq: Four times a day (QID) | ORAL | Status: DC | PRN

## 2021-04-15 MED ORDER — CLONIDINE HCL 0.2 MG PO TABS
0.4000 mg | ORAL_TABLET | Freq: Three times a day (TID) | ORAL | Status: DC
  Administered 2021-04-15 – 2021-04-20 (×17): 0.4 mg via ORAL
  Filled 2021-04-15 (×17): qty 2

## 2021-04-15 MED ORDER — SODIUM CHLORIDE 0.9% FLUSH
3.0000 mL | Freq: Two times a day (BID) | INTRAVENOUS | Status: DC
  Administered 2021-04-15 – 2021-04-20 (×11): 3 mL via INTRAVENOUS

## 2021-04-15 MED ORDER — AMLODIPINE BESYLATE 10 MG PO TABS
10.0000 mg | ORAL_TABLET | Freq: Every morning | ORAL | Status: DC
  Administered 2021-04-15 – 2021-04-20 (×6): 10 mg via ORAL
  Filled 2021-04-15 (×5): qty 1

## 2021-04-15 MED ORDER — BUPROPION HCL 100 MG PO TABS
100.0000 mg | ORAL_TABLET | Freq: Two times a day (BID) | ORAL | Status: DC
  Administered 2021-04-15 – 2021-04-20 (×12): 100 mg via ORAL
  Filled 2021-04-15 (×13): qty 1

## 2021-04-15 MED ORDER — LORATADINE 10 MG PO TABS
10.0000 mg | ORAL_TABLET | Freq: Every day | ORAL | Status: DC
  Administered 2021-04-15 – 2021-04-20 (×6): 10 mg via ORAL
  Filled 2021-04-15 (×6): qty 1

## 2021-04-15 MED ORDER — DOXAZOSIN MESYLATE 8 MG PO TABS
4.0000 mg | ORAL_TABLET | Freq: Every evening | ORAL | Status: DC
  Administered 2021-04-15 – 2021-04-16 (×2): 4 mg via ORAL
  Filled 2021-04-15 (×2): qty 1

## 2021-04-15 MED ORDER — POTASSIUM CHLORIDE CRYS ER 20 MEQ PO TBCR
40.0000 meq | EXTENDED_RELEASE_TABLET | Freq: Once | ORAL | Status: AC
  Administered 2021-04-15: 40 meq via ORAL
  Filled 2021-04-15: qty 2

## 2021-04-15 MED ORDER — ACETAMINOPHEN 325 MG PO TABS
650.0000 mg | ORAL_TABLET | Freq: Four times a day (QID) | ORAL | Status: DC | PRN
  Administered 2021-04-15 – 2021-04-18 (×4): 650 mg via ORAL
  Filled 2021-04-15 (×4): qty 2

## 2021-04-15 MED ORDER — PREGABALIN 25 MG PO CAPS
25.0000 mg | ORAL_CAPSULE | Freq: Two times a day (BID) | ORAL | Status: DC
  Administered 2021-04-15 – 2021-04-20 (×12): 25 mg via ORAL
  Filled 2021-04-15 (×12): qty 1

## 2021-04-15 MED ORDER — CARVEDILOL 25 MG PO TABS: 25.0000 mg | ORAL_TABLET | Freq: Two times a day (BID) | ORAL | Status: DC

## 2021-04-15 MED ORDER — PANTOPRAZOLE SODIUM 40 MG PO TBEC
40.0000 mg | DELAYED_RELEASE_TABLET | Freq: Every day | ORAL | Status: DC
  Administered 2021-04-15 – 2021-04-16 (×2): 40 mg via ORAL
  Filled 2021-04-15 (×2): qty 1

## 2021-04-15 MED ORDER — SERTRALINE HCL 50 MG PO TABS
50.0000 mg | ORAL_TABLET | Freq: Every day | ORAL | Status: DC
  Administered 2021-04-15 – 2021-04-20 (×6): 50 mg via ORAL
  Filled 2021-04-15 (×8): qty 1

## 2021-04-15 MED ORDER — FERROUS SULFATE 325 (65 FE) MG PO TABS
325.0000 mg | ORAL_TABLET | Freq: Two times a day (BID) | ORAL | Status: DC
  Administered 2021-04-15 – 2021-04-20 (×12): 325 mg via ORAL
  Filled 2021-04-15 (×12): qty 1

## 2021-04-15 MED ORDER — SODIUM CHLORIDE 0.9% FLUSH: 3.0000 mL | INTRAVENOUS | Status: DC | PRN

## 2021-04-15 MED ORDER — OMEGA-3-ACID ETHYL ESTERS 1 G PO CAPS
1.0000 | ORAL_CAPSULE | Freq: Every day | ORAL | Status: DC
  Administered 2021-04-15 – 2021-04-20 (×6): 1 g via ORAL
  Filled 2021-04-15 (×6): qty 1

## 2021-04-15 MED ORDER — HYDRALAZINE HCL 50 MG PO TABS
100.0000 mg | ORAL_TABLET | Freq: Three times a day (TID) | ORAL | Status: DC
  Administered 2021-04-15 – 2021-04-20 (×17): 100 mg via ORAL
  Filled 2021-04-15 (×17): qty 2

## 2021-04-15 MED ORDER — ONDANSETRON HCL 4 MG/2ML IJ SOLN: 4.0000 mg | Freq: Four times a day (QID) | INTRAMUSCULAR | Status: DC | PRN

## 2021-04-15 MED ORDER — HYDRALAZINE HCL 50 MG PO TABS: 100.0000 mg | ORAL_TABLET | Freq: Three times a day (TID) | ORAL | Status: DC

## 2021-04-15 MED ORDER — DOXAZOSIN MESYLATE 2 MG PO TABS
2.0000 mg | ORAL_TABLET | Freq: Every evening | ORAL | Status: DC
  Filled 2021-04-15: qty 1

## 2021-04-15 MED ORDER — CARVEDILOL 25 MG PO TABS
25.0000 mg | ORAL_TABLET | Freq: Two times a day (BID) | ORAL | Status: DC
  Administered 2021-04-15 – 2021-04-20 (×12): 25 mg via ORAL
  Filled 2021-04-15 (×12): qty 1

## 2021-04-15 MED ORDER — POLYETHYLENE GLYCOL 3350 17 G PO PACK
17.0000 g | PACK | Freq: Every day | ORAL | Status: DC
  Administered 2021-04-15 – 2021-04-16 (×2): 17 g via ORAL
  Filled 2021-04-15 (×2): qty 1

## 2021-04-15 MED ORDER — NITROGLYCERIN 0.4 MG SL SUBL
0.4000 mg | SUBLINGUAL_TABLET | SUBLINGUAL | Status: DC | PRN
  Filled 2021-04-15: qty 1

## 2021-04-15 MED ORDER — ASPIRIN EC 81 MG PO TBEC
81.0000 mg | DELAYED_RELEASE_TABLET | Freq: Every day | ORAL | Status: DC
  Administered 2021-04-15 – 2021-04-20 (×6): 81 mg via ORAL
  Filled 2021-04-15 (×7): qty 1

## 2021-04-15 MED ORDER — INSULIN ASPART 100 UNIT/ML IJ SOLN
0.0000 [IU] | Freq: Three times a day (TID) | INTRAMUSCULAR | Status: DC
  Administered 2021-04-15 – 2021-04-17 (×6): 3 [IU] via SUBCUTANEOUS
  Administered 2021-04-18: 4 [IU] via SUBCUTANEOUS
  Administered 2021-04-18: 3 [IU] via SUBCUTANEOUS
  Administered 2021-04-18: 11 [IU] via SUBCUTANEOUS
  Administered 2021-04-19: 4 [IU] via SUBCUTANEOUS
  Administered 2021-04-19 (×2): 3 [IU] via SUBCUTANEOUS
  Administered 2021-04-20: 5 [IU] via SUBCUTANEOUS
  Administered 2021-04-20: 3 [IU] via SUBCUTANEOUS

## 2021-04-15 MED ORDER — SODIUM CHLORIDE 0.9 % IV SOLN: 250.0000 mL | INTRAVENOUS | Status: DC | PRN

## 2021-04-15 MED ORDER — CLONIDINE HCL 0.2 MG PO TABS: 0.4000 mg | ORAL_TABLET | Freq: Three times a day (TID) | ORAL | Status: DC

## 2021-04-15 MED ORDER — ATORVASTATIN CALCIUM 10 MG PO TABS
20.0000 mg | ORAL_TABLET | Freq: Every evening | ORAL | Status: DC
  Administered 2021-04-15 – 2021-04-19 (×5): 20 mg via ORAL
  Filled 2021-04-15 (×5): qty 2

## 2021-04-15 NOTE — Progress Notes (Signed)
Heart Failure Nurse Navigator Progress Note  PCP: Nolene Ebbs, MD PCP-Cardiologist: Berneice Gandy., MD Admission Diagnosis: A/C dHF Admitted from: home with family  Presentation:   Maria Hudson presented 10/31 sent to ED from Cardiology office by Dr. Oval Linsey. Pt has increased weight gain of 16-19# over the past 2 weeks. Pt sitting in recliner with feet down, on 2LPM [place]. Pt on room air at home. Pt interactive with interview, very pleasant. Lives with adult daughter and son and their children. Pt states she has a walker at home and uses PRN. She has one small step to get into the house, but does do laundry which is upstairs. States she has trouble with strength and ShOB completing the stairs. Pt states she follows low sodium diet and limits her fluid. States she takes medications as prescribed. Has no issues with medication cost. She will help with bills when she can, but states there is no monetary contribution expectation from her daughter regarding monthly bills.  Pt frustrated as she cannot pin-point what caused this weight gain. Renal trend SCr 2.69>2.73>1.96>1.83 today, CKD IV. Pt was recently hospitalized for CP on 03/05/21-03/07/21. Reviewed HF booklet. Pt agreeable to potential HV TOC appt if unable to get quick f/u with Dr. Oval Linsey upon DC from hospital.   ECHO/ LVEF: 55-60%, G1DD  Clinical Course:  Past Medical History:  Diagnosis Date   Acute on chronic diastolic heart failure ([place]) 03/05/2021   CHF (congestive heart failure) (HCC)    CKD (chronic kidney disease) stage 4, GFR 15-29 ml/min ([place]) 03/05/2021   Diabetes mellitus without complication ([place])    Hypertension    Hypertensive urgency 04/14/2021   Renal disorder    Stroke [facility])      Social History   Socioeconomic History   Marital status: Divorced    Spouse name: Not on file   Number of children: 2   Years of education: Not on file   Highest education level: Some college, no degree  Occupational History    Occupation: retired  Tobacco Use   Smoking status: Never    Passive exposure: Never   Smokeless tobacco: Never  Vaping Use   Vaping Use: Never used  Substance and Sexual Activity   Alcohol use: Not Currently   Drug use: Not Currently   Sexual activity: Not on file  Other Topics Concern   Not on file  Social History Narrative   Not on file   Social Determinants of Health   Financial Resource Strain: Low Risk    Difficulty of Paying Living Expenses: Not very hard  Food Insecurity: No Food Insecurity   Worried About Charity fundraiser in the Last Year: Never true   Ran Out of Food in the Last Year: Never true  Transportation Needs: No Transportation Needs   Lack of Transportation (Medical): No   Lack of Transportation (Non-Medical): No  Physical Activity: Not on file  Stress: Not on file  Social Connections: Not on file    High Risk Criteria for Readmission and/or Poor Patient Outcomes: Heart failure hospital admissions (last 6 months): 2  No Show rate: NA Difficult social situation: no Demonstrates medication adherence: yes Primary Language: English Literacy level: able to read/write and comprehend. Wears glasses.  Education Assessment and Provision:  Detailed education and instructions provided on heart failure disease management including the following:  Signs and symptoms of Heart Failure When to call the physician Importance of daily weights Low sodium diet Fluid restriction Medication management Anticipated future follow-up appointments  Patient education given on each of the above topics.  Patient acknowledges understanding via teach back method and acceptance of all instructions.  Education Materials:  "Living Better With Heart Failure" Booklet, HF zone tool, & Daily Weight Tracker Tool.  Patient has scale at home: yes Patient has pill box at home: yes   Barriers of Care:   -none  Considerations/Referrals:   Referral made to Heart Failure  Pharmacist Stewardship: no Referral made to Heart Failure CSW/NCM TOC: yes, appreciated Referral made to Heart & Vascular TOC clinic: yes, pending DC date  Items for Follow-up on DC/TOC: -optimize   Pricilla Holm, MSN, RN Heart Failure Nurse Navigator [phone]

## 2021-04-15 NOTE — Plan of Care (Signed)

## 2021-04-15 NOTE — Consult Note (Addendum)
Cardiology Consultation:   Patient ID: Maria Hudson MRN: [id number]; DOB: 1961/05/29  Admit date: 04/14/2021 Date of Consult: 04/15/2021  PCP:  Nolene Ebbs, MD   [facility] Providers Cardiologist:  Skeet Latch, MD        Patient Profile:   Maria Hudson is a 60 y.o. female with a hx of CAD, chronic diastolic heart failure, type II diabetes, chronic kidney disease stage 4, hypertension, history of stroke who is being seen 04/15/2021 for the evaluation of acute decompensated diastolic heart failure at the request of Dr. Darrick Meigs.  History of Present Illness:   Ms. Guidotti was seen in the advance hypertension clinic 04/14/21 by Dr. Oval Linsey. She was noted to have gained 18 lbs in the last 2.5 weeks despite metolazone and bumex being increased to twice daily by her nephrologist. Endorsed dyspnea with minimal activity, bilateral LE edema. Given her volume overload and severely elevated blood pressure, she was referred to the ER for evaluation for admission.   Since admission, she has been placed on lasix 40 mg IV twice daily. Her home bumex and metolazone have been held. There is no urine output charted since admission. However, she reports excellent urine output since receiving lasix, but there was no hat in the toilet so it was not captured. She has already noted some improvement in her LE edema and breathing, and she no longer has the severe headache she had yesterday.  Denies chest pain, syncope or palpitations.   Past Medical History:  Diagnosis Date   Acute on chronic diastolic heart failure ([place]) 03/05/2021   CHF (congestive heart failure) (HCC)    CKD (chronic kidney disease) stage 4, GFR 15-29 ml/min ([place]) 03/05/2021   Diabetes mellitus without complication ([place])    Hypertension    Hypertensive urgency 04/14/2021   Renal disorder    Stroke [facility])     History reviewed. No pertinent surgical history.   Home Medications:  Prior to Admission medications   Medication Sig  Start Date End Date Taking? Authorizing Provider  allopurinol (ZYLOPRIM) 100 MG tablet Take 100 mg by mouth daily. 01/27/21  Yes [provider]  amLODipine (NORVASC) 10 MG tablet Take 10 mg by mouth every morning. 02/03/21  Yes [provider]  aspirin EC 81 MG tablet Take 81 mg by mouth daily. Swallow whole.   Yes [provider]  atorvastatin (LIPITOR) 20 MG tablet Take 20 mg by mouth every evening. 03/02/21  Yes [provider]  bumetanide (BUMEX) 1 MG tablet TAKE 1 AND 1/2 TABLETS TWICE A DAY Patient taking differently: Take 1.5 mg by mouth 2 (two) times daily. 03/12/21  Yes Skeet Latch, MD  bumetanide (BUMEX) 2 MG tablet Take 2 mg by mouth daily.   Yes [provider]  buPROPion (WELLBUTRIN) 100 MG tablet Take 100 mg by mouth 2 (two) times daily. 02/04/21  Yes [provider]  carvedilol (COREG) 25 MG tablet Take 1 tablet (25 mg total) by mouth 2 (two) times daily with a meal. 03/07/21 04/14/21 Yes [name], [name], MD  cloNIDine (CATAPRES) 0.2 MG tablet Take 0.4 mg by mouth 3 (three) times daily.   Yes [provider]  doxazosin (CARDURA) 2 MG tablet Take 1 tablet (2 mg total) by mouth every evening. 03/12/21  Yes Skeet Latch, MD  ferrous sulfate 325 (65 FE) MG tablet Take 325 mg by mouth 2 (two) times daily. 02/24/21  Yes [provider]  fluticasone (FLONASE) 50 MCG/ACT nasal spray Place 1 spray into both nostrils  daily as needed for allergies or rhinitis.   Yes [provider]  hydrALAZINE (APRESOLINE) 100 MG tablet Take 1 tablet (100 mg total) by mouth 3 (three) times daily. 03/12/21  Yes Skeet Latch, MD  insulin glargine (LANTUS) 100 unit/mL SOPN Inject 20 Units into the skin at bedtime.   Yes [provider]  isosorbide mononitrate (IMDUR) 60 MG 24 hr tablet Take 1 tablet (60 mg total) by mouth daily. 03/13/21  Yes Skeet Latch, MD  loratadine (CLARITIN) 10 MG tablet Take 10 mg  by mouth daily. 02/14/21  Yes [provider]  metolazone (ZAROXOLYN) 2.5 MG tablet Take 5 mg by mouth 2 (two) times daily. For 4 days   Yes [provider]  Multiple Vitamin (MULTIVITAMIN ADULT) TABS Take 1 tablet by mouth daily.   Yes [provider]  nitroGLYCERIN (NITROSTAT) 0.4 MG SL tablet Place 1 tablet (0.4 mg total) under the tongue every 5 (five) minutes as needed for chest pain. 03/07/21  Yes [name], [name], MD  NOVOLOG FLEXPEN 100 UNIT/ML FlexPen Inject 8 Units into the skin 3 (three) times daily with meals. 01/12/21  Yes [provider]  Omega-3 Fatty Acids (FISH OIL) 1000 MG CAPS Take 1 capsule by mouth daily.   Yes [provider]  polyethylene glycol (MIRALAX / GLYCOLAX) 17 g packet Take 17 g by mouth daily. 03/07/21  Yes [name], [name], MD  pregabalin (LYRICA) 25 MG capsule Take 25 mg by mouth 2 (two) times daily. 02/03/21  Yes [provider]  sertraline (ZOLOFT) 50 MG tablet Take 50 mg by mouth daily. 02/01/21  Yes [provider]  sodium bicarbonate 650 MG tablet Take 650 mg by mouth 2 (two) times daily.   Yes [provider]  Turmeric 500 MG CAPS Take 1 capsule by mouth daily.   Yes [provider]  VICTOZA 18 MG/3ML SOPN Inject 1.8 mg into the skin daily. 01/27/21  Yes [provider]  cloNIDine (CATAPRES) 0.3 MG tablet Take 0.3 mg by mouth 2 (two) times daily.    [provider]    Inpatient Medications: Scheduled Meds:  amLODipine  10 mg Oral q morning   aspirin EC  81 mg Oral Daily   atorvastatin  20 mg Oral QPM   buPROPion  100 mg Oral BID   carvedilol  25 mg Oral BID WC   cloNIDine  0.4 mg Oral TID   doxazosin  4 mg Oral QPM   enoxaparin (LOVENOX) injection  55 mg Subcutaneous QHS   ferrous sulfate  325 mg Oral BID   furosemide  40 mg Intravenous BID   hydrALAZINE  100 mg Oral TID   insulin aspart  0-20 Units Subcutaneous TID WC   insulin aspart  0-5  Units Subcutaneous QHS   isosorbide mononitrate  60 mg Oral Daily   loratadine  10 mg Oral Daily   omega-3 acid ethyl esters  1 capsule Oral Daily   pantoprazole  40 mg Oral Q1200   polyethylene glycol  17 g Oral Daily   pregabalin  25 mg Oral BID   sertraline  50 mg Oral Daily   sodium chloride flush  3 mL Intravenous Q12H   Continuous Infusions:  sodium chloride     PRN Meds: sodium chloride, acetaminophen, nitroGLYCERIN, ondansetron **OR** ondansetron (ZOFRAN) IV, sodium chloride flush  Allergies:    Allergies  Allergen Reactions   Hydrocodone Itching    Patient able to tolerate when taken with Benadryl.  Oxycodone-Acetaminophen Itching    Patient able to tolerate when taken with Benadryl.    Social History:   Social History   Socioeconomic History   Marital status: Divorced    Spouse name: Not on file   Number of children: 2   Years of education: Not on file   Highest education level: Some college, no degree  Occupational History   Occupation: retired  Tobacco Use   Smoking status: Never    Passive exposure: Never   Smokeless tobacco: Never  Vaping Use   Vaping Use: Never used  Substance and Sexual Activity   Alcohol use: Not Currently   Drug use: Not Currently   Sexual activity: Not on file  Other Topics Concern   Not on file  Social History Narrative   Not on file   Social Determinants of Health   Financial Resource Strain: Low Risk    Difficulty of Paying Living Expenses: Not very hard  Food Insecurity: No Food Insecurity   Worried About Charity fundraiser in the Last Year: Never true   [place] in the Last Year: Never true  Transportation Needs: No Transportation Needs   Lack of Transportation (Medical): No   Lack of Transportation (Non-Medical): No  Physical Activity: Not on file  Stress: Not on file  Social Connections: Not on file  Intimate Partner Violence: Not on file    Family History:    Family History  Problem Relation Age  of Onset   Hypertension Mother    Stroke Mother    Hypertension Father    Hypertension Sister    Hypertension Son    Hypertension Daughter      ROS:  Please see the history of present illness.  Constitutional: Negative for chills, fever, night sweats, unintentional weight loss  HENT: Negative for ear pain and hearing loss.   Eyes: Negative for loss of vision and eye pain.  Respiratory: Negative for sputum, wheezing.  Positive for cough Cardiovascular: See HPI. Gastrointestinal: Negative for abdominal pain, melena, and hematochezia.  Genitourinary: Negative for dysuria and hematuria.  Musculoskeletal: Negative for falls and myalgias.  Skin: Negative for itching and rash.  Neurological: Negative for focal weakness, focal sensory changes and loss of consciousness.  Endo/Heme/Allergies: Does not bruise/bleed easily.   All other ROS reviewed and negative.     Physical Exam/Data:   Vitals:   04/15/21 0047 04/15/21 0454 04/15/21 0758 04/15/21 1117  BP:  (!) 169/76 (!) 176/80 (!) 163/76  Pulse:  78 70 73  Resp:  18 19 19   Temp:  98.4 F (36.9 C) 98.8 F (37.1 C) 98 F (36.7 C)  TempSrc:  Oral Oral Oral  SpO2:  98% 98% 100%  Weight: 110.9 kg     Height: 5\' 3"  (1.6 m)       Intake/Output Summary (Last 24 hours) at 04/15/2021 1431 Last data filed at 04/15/2021 1247 Gross per 24 hour  Intake 476 ml  Output 400 ml  Net 76 ml   Last 3 Weights 04/15/2021 04/14/2021 03/12/2021  Weight (lbs) 244 lb 6.4 oz 251 lb 8 oz 234 lb 11.2 oz  Weight (kg) 110.859 kg 114.08 kg 106.459 kg     Body mass index is 43.29 kg/m.  General:  Well nourished, well developed, in no acute distress. On nasal cannula. HEENT: normal Neck: JVD elevated  Vascular: No carotid bruits; Distal pulses 2+ bilaterally Cardiac:  normal S1, S2; RRR; 1/6 systolic murmur, +S3 and S4 Lungs:  clear  to auscultation bilaterally except diminished in bilateral bases Abd: soft, nontender, no hepatomegaly  Ext: 2+ bilateral  LE edema Musculoskeletal:  No deformities, BUE and BLE strength normal and equal Skin: warm and dry  Neuro:  CNs 2-12 intact, no focal abnormalities noted Psych:  Normal affect   EKG:  The EKG was personally reviewed and demonstrates:  NSR at 76 bpm Telemetry:  Telemetry was personally reviewed and demonstrates:  NSR  Relevant CV Studies:  1. Left ventricular ejection fraction, by estimation, is 55 to 60%. The  left ventricle has normal function. The left ventricle has no regional  wall motion abnormalities. There is mild left ventricular hypertrophy.  Left ventricular diastolic parameters  are consistent with Grade I diastolic dysfunction (impaired relaxation).  Elevated left atrial pressure.   2. Right ventricular systolic function is normal. The right ventricular  size is normal.   3. Left atrial size was mildly dilated.   4. The mitral valve is normal in structure. Trivial mitral valve  regurgitation. No evidence of mitral stenosis.   5. The aortic valve is tricuspid. Aortic valve regurgitation is not  visualized. No aortic stenosis is present.   6. The inferior vena cava is normal in size with greater than 50%  respiratory variability, suggesting right atrial pressure of 3 mmHg.  Laboratory Data:  High Sensitivity Troponin:  No results for input(s): TROPONINIHS in the last 720 hours.   Chemistry Recent Labs  Lab 04/14/21 1219 04/15/21 0412  NA 140 138  K 4.1 3.3*  CL 110 111  CO2 23 21*  GLUCOSE 93 141*  BUN 45* 39*  CREATININE 1.96* 1.83*  CALCIUM 8.2* 8.6*  GFRNONAA 29* 31*  ANIONGAP 7 6    Recent Labs  Lab 04/15/21 0412  PROT 5.2*  ALBUMIN 2.6*  AST 17  ALT 9  ALKPHOS 42  BILITOT 0.7   Lipids No results for input(s): CHOL, TRIG, HDL, LABVLDL, LDLCALC, CHOLHDL in the last 168 hours.  Hematology Recent Labs  Lab 04/14/21 1219 04/15/21 0412  WBC 7.7 7.6  RBC 3.69* 3.78*  HGB 10.2* 10.1*  HCT 30.7* 30.6*  MCV 83.2 81.0  MCH 27.6 26.7  MCHC 33.2  33.0  RDW 18.6* 18.4*  PLT 270 261   Thyroid No results for input(s): TSH, FREET4 in the last 168 hours.  BNP Recent Labs  Lab 04/14/21 1219  BNP 97.1    DDimer No results for input(s): DDIMER in the last 168 hours.   Radiology/Studies:  DG Chest 2 View  Result Date: 04/14/2021 CLINICAL DATA:  shob EXAM: CHEST - 2 VIEW COMPARISON:  03/05/2021. FINDINGS: Similar cardiomediastinal silhouette, mildly enlarged. Chronic mild interstitial prominence without consolidation. No visible pleural effusions or pneumothorax. No acute osseous abnormality. IMPRESSION: 1. Chronic mild interstitial prominence, mild interstitial edema versus the sequela of recurrent bouts of CHF. No consolidation. 2. Mild cardiomegaly. Electronically Signed   By: Margaretha Sheffield M.D.   On: 04/14/2021 13:01     Assessment and Plan:   Acute on chronic diastolic heart failure Chronic kidney disease, stage 4 Hypoalbuminemia Hypokalemia -albumin 2.6, likely contributing to third spacing -received lasix IV, with K dropping to 3.3 and Cr going from 1.96 to 1.83. While no urine output noted, she reports excellent urine output and some improvement in her symptoms. -BNP 97, suspect falsely low due to obesity -GFR has typically been <30, so no SGLT2i started. If renal function improves, would reassess  Labile vs uncontrolled hypertension -followed closely by Dr. Oval Linsey and  her nephrology team. -on carvedilol 25 mg BID, hydralazine 100 mg TID, amlodipine 10 mg daily, doxazosin 2 mg PM, imdur 60 mg daily -there is room to increase doxazosin and imdur, though given her elevated BP unlikely that these changes alone will get her to goal.  -will start with increasing doxazosin to 4 mg in the evening -renin:angiotensin workup unrevealing, as were metanephrines. -had elevated Cr on losartan. If function/potassium allows, could try MRA  Type II diabetes -A1c 6.7 -see above re: SGLT2i -on insulin chronically  For now,  continue diuresis and we will continue to titrate BP meds. If renal function worsens, would need to get nephrology involved.  Risk Assessment/Risk Scores:    New York Heart Association (NYHA) Functional Class NYHA Class III   For questions or updates, please contact [facility] Please consult [email or web address] for contact info under    Signed, Buford Dresser, MD  04/15/2021 2:31 PM

## 2021-04-15 NOTE — Progress Notes (Signed)
PROGRESS NOTE  Maria Hudson WJX:[id number] DOB: Jan 25, 1961   PCP: Nolene Ebbs, MD  Patient is from: Home.  Independent at baseline.  DOA: 04/14/2021 LOS: 1  Chief complaints:  Chief Complaint  Patient presents with   Shortness of Breath     Brief Narrative / Interim history: 60 year old F with PMH of diastolic CHF, DM-2, CKD-4, CVA, HTN and morbid obesity sent to ED from cardiology office by Dr. Oval Linsey for hypertensive urgency, 18 pounds weight gain and dyspnea, and admitted for acute on chronic diastolic CHF and hypertensive urgency.  Started on IV Lasix.  Cardiology following.  Subjective: Seen and examined earlier this morning.  No major events overnight of this morning.  Reports improvement in her breathing and edema.  She denies chest pain.  Reports making a lot of urine but has been flushing it.  Objective: Vitals:   04/15/21 0047 04/15/21 0454 04/15/21 0758 04/15/21 1117  BP:  (!) 169/76 (!) 176/80 (!) 163/76  Pulse:  78 70 73  Resp:  18 19 19   Temp:  98.4 F (36.9 C) 98.8 F (37.1 C) 98 F (36.7 C)  TempSrc:  Oral Oral Oral  SpO2:  98% 98% 100%  Weight: 110.9 kg     Height: 5\' 3"  (1.6 m)       Intake/Output Summary (Last 24 hours) at 04/15/2021 1421 Last data filed at 04/15/2021 1247 Gross per 24 hour  Intake 476 ml  Output 400 ml  Net 76 ml   Filed Weights   04/15/21 0047  Weight: 110.9 kg    Examination:  GENERAL: No apparent distress.  Nontoxic. HEENT: MMM.  Vision and hearing grossly intact.  NECK: Supple.  Difficult to assess JVD due to body habitus. RESP: 98% on RA.  No IWOB.  Fair aeration bilaterally. CVS:  RRR. Heart sounds normal.  ABD/GI/GU: BS+. Abd soft, NTND.  MSK/EXT:  Moves extremities. No apparent deformity.  2+ edema in BLE. SKIN: no apparent skin lesion or wound NEURO: Awake, alert and oriented appropriately.  No apparent focal neuro deficit. PSYCH: Calm. Normal affect.   Procedures:  None  Microbiology  summarized: [id number] and influenza PCR nonreactive.  Assessment & Plan: Acute on chronic diastolic CHF: TTE in 12/8467 with LVEF of 55 to 60%, G1-DD.  Progressive dyspnea and edema despite Bumex and metolazone at home.  Reportedly about 18 pounds weight gain as well.  CXR with interstitial edema and mild cardiomegaly.  BNP 97 but could be falsely low given morbid obesity.  Likely due to uncontrolled hypertension.  Started on IV Lasix.  Reports improvement in his symptoms but still with significant fluid overload.  I&O was not captured well. -Cardiology following -She may need higher dose of IV Lasix in the setting of his CKD 4 but defer this to cardiology -Requested RN and patient to capture urine output so we can measure -Manage uncontrolled hypertension/hypertensive urgency as below. -Seems she was referred for sleep study by cardiology already. -Sodium and fluid restriction  Uncontrolled hypertension/resistant hypertension: BP remains elevated but improved. -Continue Coreg, Cardura, Catapres, Imdur, hydralazine, amlodipine and Lasix. -Needs outpatient sleep study.  CKD-4/azotemia: Renal function seems to be improving with diuretics. Recent Labs    03/04/21 1132 03/05/21 1500 03/06/21 0545 03/07/21 0334 03/13/21 0751 04/14/21 1219 04/15/21 0412  BUN 48* 60* 58* 57* 69* 45* 39*  CREATININE 2.77* 3.20* 2.94* 2.69* 2.73* 1.96* 1.83*  -Continue monitoring  Controlled DM-2 with CKD-4/hyperglycemia: A1c 6.7%. Recent Labs  Lab 04/15/21 0017 04/15/21 0651 04/15/21 1118  GLUCAP 112* 148* 115*  -Continue current insulin regimen and statin  Anemia of renal disease: Slight drop in Hgb from baseline but stable here. Recent Labs    03/04/21 1132 03/05/21 1500 03/06/21 0545 04/14/21 1219 04/15/21 0412  HGB 12.3 12.1 12.7 10.2* 10.1*  -Continue monitoring -Check anemia panel in the morning  History of CAD: Denies chest pain or anginal symptoms. -Continue Coreg, aspirin and a  statin.  At risk for sleep apnea -Needs sleep study.  Depression: Stable. -Continue home Zoloft and Wellbutrin.  Hypokalemia: K3.3. -P.o. KCl 40x1 -Recheck in the morning  Goal of care counseling: Patient was DNR/DNI on admission.  She wanted full code after talking to her children.  However, after we talked about risk and benefit of CPR, she prefers to remain DNR/DNI.   Morbid obesity Body mass index is 43.29 kg/m.  -Encourage lifestyle change to lose weight.       DVT prophylaxis:  On subcu Lovenox.  Code Status: DNR/DNI Family Communication: Patient and/or RN. Available if any question.  Level of care: Telemetry Cardiac Status is: Inpatient  Remains inpatient appropriate because: Acute on chronic diastolic CHF and uncontrolled hypertension requiring IV diuretics       Consultants:  Cardiology   Sch Meds:  Scheduled Meds:  amLODipine  10 mg Oral q morning   aspirin EC  81 mg Oral Daily   atorvastatin  20 mg Oral QPM   buPROPion  100 mg Oral BID   carvedilol  25 mg Oral BID WC   cloNIDine  0.4 mg Oral TID   doxazosin  2 mg Oral QPM   enoxaparin (LOVENOX) injection  55 mg Subcutaneous QHS   ferrous sulfate  325 mg Oral BID   furosemide  40 mg Intravenous BID   hydrALAZINE  100 mg Oral TID   insulin aspart  0-20 Units Subcutaneous TID WC   insulin aspart  0-5 Units Subcutaneous QHS   isosorbide mononitrate  60 mg Oral Daily   loratadine  10 mg Oral Daily   omega-3 acid ethyl esters  1 capsule Oral Daily   pantoprazole  40 mg Oral Q1200   polyethylene glycol  17 g Oral Daily   pregabalin  25 mg Oral BID   sertraline  50 mg Oral Daily   sodium chloride flush  3 mL Intravenous Q12H   Continuous Infusions:  sodium chloride     PRN Meds:.sodium chloride, acetaminophen, nitroGLYCERIN, ondansetron **OR** ondansetron (ZOFRAN) IV, sodium chloride flush  Antimicrobials: Anti-infectives (From admission, onward)    None        I have personally  reviewed the following labs and images: CBC: Recent Labs  Lab 04/14/21 1219 04/15/21 0412  WBC 7.7 7.6  HGB 10.2* 10.1*  HCT 30.7* 30.6*  MCV 83.2 81.0  PLT 270 261   BMP &GFR Recent Labs  Lab 04/14/21 1219 04/15/21 0412  NA 140 138  K 4.1 3.3*  CL 110 111  CO2 23 21*  GLUCOSE 93 141*  BUN 45* 39*  CREATININE 1.96* 1.83*  CALCIUM 8.2* 8.6*   Estimated Creatinine Clearance: 39.6 mL/min (A) (by C-G formula based on SCr of 1.83 mg/dL (H)). Liver & Pancreas: Recent Labs  Lab 04/15/21 0412  AST 17  ALT 9  ALKPHOS 42  BILITOT 0.7  PROT 5.2*  ALBUMIN 2.6*   No results for input(s): LIPASE, AMYLASE in the last 168 hours. No results for input(s): AMMONIA in the last 168 hours. Diabetic: Recent Labs  04/14/21 2045 04/15/21 0412  HGBA1C 6.7* 6.7*   Recent Labs  Lab 04/15/21 0017 04/15/21 0651 04/15/21 1118  GLUCAP 112* 148* 115*   Cardiac Enzymes: No results for input(s): CKTOTAL, CKMB, CKMBINDEX, TROPONINI in the last 168 hours. No results for input(s): PROBNP in the last 8760 hours. Coagulation Profile: No results for input(s): INR, PROTIME in the last 168 hours. Thyroid Function Tests: No results for input(s): TSH, T4TOTAL, FREET4, T3FREE, THYROIDAB in the last 72 hours. Lipid Profile: No results for input(s): CHOL, HDL, LDLCALC, TRIG, CHOLHDL, LDLDIRECT in the last 72 hours. Anemia Panel: No results for input(s): VITAMINB12, FOLATE, FERRITIN, TIBC, IRON, RETICCTPCT in the last 72 hours. Urine analysis:    Component Value Date/Time   COLORURINE YELLOW 03/04/2021 2147   APPEARANCEUR CLOUDY (A) 03/04/2021 2147   LABSPEC 1.025 03/04/2021 2147   PHURINE 5.5 03/04/2021 2147   GLUCOSEU NEGATIVE 03/04/2021 2147   HGBUR NEGATIVE 03/04/2021 2147   BILIRUBINUR NEGATIVE 03/04/2021 2147   KETONESUR NEGATIVE 03/04/2021 2147   PROTEINUR >300 (A) 03/04/2021 2147   NITRITE NEGATIVE 03/04/2021 2147   LEUKOCYTESUR LARGE (A) 03/04/2021 2147   Sepsis  Labs: Invalid input(s): PROCALCITONIN, [place]  Microbiology: Recent Results (from the past 240 hour(s))  Resp Panel by RT-PCR (Flu A&B, Covid) Nasopharyngeal Swab     Status: None   Collection Time: 04/14/21  4:34 PM   Specimen: Nasopharyngeal Swab; Nasopharyngeal(NP) swabs in vial transport medium  Result Value Ref Range Status   SARS Coronavirus 2 by RT PCR NEGATIVE NEGATIVE Final    Comment: (NOTE) SARS-CoV-2 target nucleic acids are NOT DETECTED.  The SARS-CoV-2 RNA is generally detectable in upper respiratory specimens during the acute phase of infection. The lowest concentration of SARS-CoV-2 viral copies this assay can detect is 138 copies/mL. A negative result does not preclude SARS-Cov-2 infection and should not be used as the sole basis for treatment or other patient management decisions. A negative result may occur with  improper specimen collection/handling, submission of specimen other than nasopharyngeal swab, presence of viral mutation(s) within the areas targeted by this assay, and inadequate number of viral copies(<138 copies/mL). A negative result must be combined with clinical observations, patient history, and epidemiological information. The expected result is Negative.  Fact Sheet for Patients:  EntrepreneurPulse.com.au  Fact Sheet for Healthcare Providers:  IncredibleEmployment.be  This test is no t yet approved or cleared by the [place] FDA and  has been authorized for detection and/or diagnosis of SARS-CoV-2 by FDA under an Emergency Use Authorization (EUA). This EUA will remain  in effect (meaning this test can be used) for the duration of the COVID-19 declaration under Section 564(b)(1) of the Act, 21 U.S.C.section 360bbb-3(b)(1), unless the authorization is terminated  or revoked sooner.       Influenza A by PCR NEGATIVE NEGATIVE Final   Influenza B by PCR NEGATIVE NEGATIVE Final    Comment:  (NOTE) The Xpert Xpress SARS-CoV-2/FLU/RSV plus assay is intended as an aid in the diagnosis of influenza from Nasopharyngeal swab specimens and should not be used as a sole basis for treatment. Nasal washings and aspirates are unacceptable for Xpert Xpress SARS-CoV-2/FLU/RSV testing.  Fact Sheet for Patients: EntrepreneurPulse.com.au  Fact Sheet for Healthcare Providers: IncredibleEmployment.be  This test is not yet approved or cleared by the [place] FDA and has been authorized for detection and/or diagnosis of SARS-CoV-2 by FDA under an Emergency Use Authorization (EUA). This EUA will remain in effect (meaning this test can be used) for the  duration of the COVID-19 declaration under Section 564(b)(1) of the Act, 21 U.S.C. section 360bbb-3(b)(1), unless the authorization is terminated or revoked.  Performed at [facility], [place] [address], [place], [place] [address]     Radiology Studies: No results found.    Iva Posten T. [place]  If 7PM-7AM, please contact night-coverage [email or web address] 04/15/2021, 2:21 PM

## 2021-04-15 NOTE — Progress Notes (Signed)
Mobility Specialist Progress Note:   04/15/21 1110  Mobility  Activity Ambulated in hall  Level of Assistance Standby assist, set-up cues, supervision of patient - no hands on  Assistive Device Front wheel walker  Distance Ambulated (ft) 520 ft  Mobility Ambulated with assistance in hallway  Mobility Response Tolerated well  Mobility performed by Mobility specialist  $Mobility charge 1 Mobility   Pt received in bed willing to participate in mobility. No complaints of pain, Pt stated she felt a little SOB halfway through ambulation. Pt returned to chair with call bell in reach and all needs met.   [facility] Health and safety inspector Phone [phone]

## 2021-04-16 DIAGNOSIS — E1169 Type 2 diabetes mellitus with other specified complication: Secondary | ICD-10-CM | POA: Diagnosis not present

## 2021-04-16 DIAGNOSIS — E1122 Type 2 diabetes mellitus with diabetic chronic kidney disease: Secondary | ICD-10-CM | POA: Diagnosis not present

## 2021-04-16 DIAGNOSIS — N1832 Chronic kidney disease, stage 3b: Secondary | ICD-10-CM | POA: Diagnosis not present

## 2021-04-16 DIAGNOSIS — Z794 Long term (current) use of insulin: Secondary | ICD-10-CM | POA: Diagnosis not present

## 2021-04-16 DIAGNOSIS — I2510 Atherosclerotic heart disease of native coronary artery without angina pectoris: Secondary | ICD-10-CM | POA: Diagnosis not present

## 2021-04-16 DIAGNOSIS — E8809 Other disorders of plasma-protein metabolism, not elsewhere classified: Secondary | ICD-10-CM | POA: Diagnosis not present

## 2021-04-16 DIAGNOSIS — I10 Essential (primary) hypertension: Secondary | ICD-10-CM | POA: Diagnosis not present

## 2021-04-16 DIAGNOSIS — N184 Chronic kidney disease, stage 4 (severe): Secondary | ICD-10-CM | POA: Diagnosis not present

## 2021-04-16 DIAGNOSIS — I509 Heart failure, unspecified: Secondary | ICD-10-CM | POA: Diagnosis not present

## 2021-04-16 DIAGNOSIS — I5033 Acute on chronic diastolic (congestive) heart failure: Secondary | ICD-10-CM | POA: Diagnosis not present

## 2021-04-16 DIAGNOSIS — M109 Gout, unspecified: Secondary | ICD-10-CM | POA: Diagnosis not present

## 2021-04-16 DIAGNOSIS — I159 Secondary hypertension, unspecified: Secondary | ICD-10-CM | POA: Diagnosis not present

## 2021-04-16 LAB — RENAL FUNCTION PANEL
Albumin: 2.7 g/dL — ABNORMAL LOW (ref 3.5–5.0)
Anion gap: 5 (ref 5–15)
BUN: 40 mg/dL — ABNORMAL HIGH (ref 6–20)
CO2: 24 mmol/L (ref 22–32)
Calcium: 8.4 mg/dL — ABNORMAL LOW (ref 8.9–10.3)
Chloride: 108 mmol/L (ref 98–111)
Creatinine, Ser: 2.05 mg/dL — ABNORMAL HIGH (ref 0.44–1.00)
GFR, Estimated: 27 mL/min — ABNORMAL LOW (ref >60)
Glucose, Bld: 142 mg/dL — ABNORMAL HIGH (ref 70–99)
Phosphorus: 4.4 mg/dL (ref 2.5–4.6)
Potassium: 3.8 mmol/L (ref 3.5–5.1)
Sodium: 137 mmol/L (ref 135–145)

## 2021-04-16 LAB — RETICULOCYTES
Immature Retic Fract: 16 % — ABNORMAL HIGH (ref 2.3–15.9)
RBC.: 3.78 MIL/uL — ABNORMAL LOW (ref 3.87–5.11)
Retic Count, Absolute: 76.7 10*3/uL (ref 19.0–186.0)
Retic Ct Pct: 2 % (ref 0.4–3.1)

## 2021-04-16 LAB — CBC
HCT: 31.5 % — ABNORMAL LOW (ref 36.0–46.0)
Hemoglobin: 10 g/dL — ABNORMAL LOW (ref 12.0–15.0)
MCH: 26.1 pg (ref 26.0–34.0)
MCHC: 31.7 g/dL (ref 30.0–36.0)
MCV: 82.2 fL (ref 80.0–100.0)
Platelets: 276 10*3/uL (ref 150–400)
RBC: 3.83 MIL/uL — ABNORMAL LOW (ref 3.87–5.11)
RDW: 18.5 % — ABNORMAL HIGH (ref 11.5–15.5)
WBC: 5.9 10*3/uL (ref 4.0–10.5)
nRBC: 0 % (ref 0.0–0.2)

## 2021-04-16 LAB — GLUCOSE, CAPILLARY
Glucose-Capillary: 133 mg/dL — ABNORMAL HIGH (ref 70–99)
Glucose-Capillary: 128 mg/dL — ABNORMAL HIGH (ref 70–99)
Glucose-Capillary: 102 mg/dL — ABNORMAL HIGH (ref 70–99)
Glucose-Capillary: 145 mg/dL — ABNORMAL HIGH (ref 70–99)

## 2021-04-16 LAB — IRON AND TIBC
Iron: 46 ug/dL (ref 28–170)
Saturation Ratios: 23 % (ref 10.4–31.8)
TIBC: 200 ug/dL — ABNORMAL LOW (ref 250–450)
UIBC: 154 ug/dL

## 2021-04-16 LAB — FERRITIN: Ferritin: 400 ng/mL — ABNORMAL HIGH (ref 11–307)

## 2021-04-16 LAB — FOLATE: Folate: 26.3 ng/mL (ref >5.9)

## 2021-04-16 LAB — MAGNESIUM: Magnesium: 2.2 mg/dL (ref 1.7–2.4)

## 2021-04-16 LAB — VITAMIN B12: Vitamin B-12: 789 pg/mL (ref 180–914)

## 2021-04-16 MED ORDER — ISOSORBIDE MONONITRATE ER 60 MG PO TB24
120.0000 mg | ORAL_TABLET | Freq: Every day | ORAL | Status: DC
  Administered 2021-04-16 – 2021-04-20 (×5): 120 mg via ORAL
  Filled 2021-04-16 (×6): qty 2

## 2021-04-16 MED ORDER — SENNA 8.6 MG PO TABS
2.0000 | ORAL_TABLET | Freq: Every day | ORAL | Status: DC
  Administered 2021-04-16 – 2021-04-20 (×5): 17.2 mg via ORAL
  Filled 2021-04-16 (×5): qty 2

## 2021-04-16 NOTE — Progress Notes (Signed)
Progress Note  Patient Name: Maria Hudson Date of Encounter: 04/16/2021  [facility] Cardiologist: Skeet Latch, MD   Subjective   No acute events overnight. Still making excellent urine. Swelling continues but less painful. Remains on O2. No chest pain.  Inpatient Medications    Scheduled Meds:  amLODipine  10 mg Oral q morning   aspirin EC  81 mg Oral Daily   atorvastatin  20 mg Oral QPM   buPROPion  100 mg Oral BID   carvedilol  25 mg Oral BID WC   cloNIDine  0.4 mg Oral TID   doxazosin  4 mg Oral QPM   enoxaparin (LOVENOX) injection  55 mg Subcutaneous QHS   ferrous sulfate  325 mg Oral BID   furosemide  40 mg Intravenous BID   hydrALAZINE  100 mg Oral TID   insulin aspart  0-20 Units Subcutaneous TID WC   insulin aspart  0-5 Units Subcutaneous QHS   isosorbide mononitrate  120 mg Oral Daily   loratadine  10 mg Oral Daily   omega-3 acid ethyl esters  1 capsule Oral Daily   pantoprazole  40 mg Oral Q1200   polyethylene glycol  17 g Oral Daily   pregabalin  25 mg Oral BID   sertraline  50 mg Oral Daily   sodium chloride flush  3 mL Intravenous Q12H   Continuous Infusions:  sodium chloride     PRN Meds: sodium chloride, acetaminophen, nitroGLYCERIN, ondansetron **OR** ondansetron (ZOFRAN) IV, sodium chloride flush   Vital Signs    Vitals:   04/15/21 1939 04/15/21 2351 04/16/21 0500 04/16/21 0845  BP: (!) 163/75 (!) 158/79 (!) 175/75 (!) 182/74  Pulse: 69 66 65 63  Resp: 20 18 18    Temp: 98.1 F (36.7 C) 97.9 F (36.6 C) 97.8 F (36.6 C) 97.6 F (36.4 C)  TempSrc: Oral Oral Oral Oral  SpO2: 100% 100% 100%   Weight:   109.6 kg   Height:        Intake/Output Summary (Last 24 hours) at 04/16/2021 0924 Last data filed at 04/16/2021 0344 Gross per 24 hour  Intake 476 ml  Output 1550 ml  Net -1074 ml   Last 3 Weights 04/16/2021 04/15/2021 04/14/2021  Weight (lbs) 241 lb 11.2 oz 244 lb 6.4 oz 251 lb 8 oz  Weight (kg) 109.634 kg 110.859 kg 114.08  kg      Telemetry    NSR - Personally Reviewed  ECG    No new since 10/31 - Personally Reviewed  Physical Exam   GEN: No acute distress.   Neck: No JVD appreciated but difficult body habitus Cardiac: RRR, S3/S4 not heard today, 1/6 systolic murmur Respiratory: Clear to auscultation bilaterally. GI: Soft, nontender, non-distended  MS: 2+ bilateral LE edema; No deformity. Neuro:  Nonfocal  Psych: Normal affect   Labs    High Sensitivity Troponin:  No results for input(s): TROPONINIHS in the last 720 hours.   Chemistry Recent Labs  Lab 04/14/21 1219 04/15/21 0412 04/16/21 0352  NA 140 138 137  K 4.1 3.3* 3.8  CL 110 111 108  CO2 23 21* 24  GLUCOSE 93 141* 142*  BUN 45* 39* 40*  CREATININE 1.96* 1.83* 2.05*  CALCIUM 8.2* 8.6* 8.4*  MG  --   --  2.2  PROT  --  5.2*  --   ALBUMIN  --  2.6* 2.7*  AST  --  17  --   ALT  --  9  --  ALKPHOS  --  42  --   BILITOT  --  0.7  --   GFRNONAA 29* 31* 27*  ANIONGAP 7 6 5     Lipids No results for input(s): CHOL, TRIG, HDL, LABVLDL, LDLCALC, CHOLHDL in the last 168 hours.  Hematology Recent Labs  Lab 04/14/21 1219 04/15/21 0412 04/16/21 0352  WBC 7.7 7.6 5.9  RBC 3.69* 3.78* 3.83*  3.78*  HGB 10.2* 10.1* 10.0*  HCT 30.7* 30.6* 31.5*  MCV 83.2 81.0 82.2  MCH 27.6 26.7 26.1  MCHC 33.2 33.0 31.7  RDW 18.6* 18.4* 18.5*  PLT 270 261 276   Thyroid No results for input(s): TSH, FREET4 in the last 168 hours.  BNP Recent Labs  Lab 04/14/21 1219  BNP 97.1    DDimer No results for input(s): DDIMER in the last 168 hours.   Radiology    DG Chest 2 View  Result Date: 04/14/2021 CLINICAL DATA:  shob EXAM: CHEST - 2 VIEW COMPARISON:  03/05/2021. FINDINGS: Similar cardiomediastinal silhouette, mildly enlarged. Chronic mild interstitial prominence without consolidation. No visible pleural effusions or pneumothorax. No acute osseous abnormality. IMPRESSION: 1. Chronic mild interstitial prominence, mild interstitial edema  versus the sequela of recurrent bouts of CHF. No consolidation. 2. Mild cardiomegaly. Electronically Signed   By: Margaretha Sheffield M.D.   On: 04/14/2021 13:01    Cardiac Studies   Echo 03/06/21  1. Left ventricular ejection fraction, by estimation, is 55 to 60%. The  left ventricle has normal function. The left ventricle has no regional  wall motion abnormalities. There is mild left ventricular hypertrophy.  Left ventricular diastolic parameters  are consistent with Grade I diastolic dysfunction (impaired relaxation).  Elevated left atrial pressure.   2. Right ventricular systolic function is normal. The right ventricular  size is normal.   3. Left atrial size was mildly dilated.   4. The mitral valve is normal in structure. Trivial mitral valve  regurgitation. No evidence of mitral stenosis.   5. The aortic valve is tricuspid. Aortic valve regurgitation is not  visualized. No aortic stenosis is present.   6. The inferior vena cava is normal in size with greater than 50%  respiratory variability, suggesting right atrial pressure of 3 mmHg.  Patient Profile     60 y.o. female with a hx of CAD, chronic diastolic heart failure, type II diabetes, chronic kidney disease stage 4, hypertension, history of stroke who is being seen in consultation for the evaluation of acute decompensated diastolic heart failure at the request of Dr. Darrick Meigs.  Assessment & Plan    Acute on chronic diastolic heart failure Chronic kidney disease, stage 4 Hypoalbuminemia -albumin 2.6-2.7, likely contributing to third spacing -I/O inaccurate, weight 110.9 kg on admission, weight today 109.6 kg -BNP 97, suspect falsely low due to obesity -GFR has typically been <30, so no SGLT2i started. If renal function improves, would reassess   Labile vs uncontrolled hypertension -followed closely by Dr. Oval Linsey and her nephrology team. -at home, on carvedilol 25 mg BID, hydralazine 100 mg TID, amlodipine 10 mg daily,  doxazosin 2 mg PM, imdur 60 mg daily -increased doxazosin to 4 mg in the evening, slight improvement in BP -will increase imdur today to 120 mg daily -renin:angiotensin workup unrevealing, as were metanephrines. -had elevated Cr on losartan. If function/potassium allows, could try MRA, but may not be able to use given her CKD   Type II diabetes -A1c 6.7 -see above re: SGLT2i -on insulin chronically   For now, continue  diuresis and we will continue to titrate BP meds. If renal function worsens significantly, would need to get nephrology involved.  For questions or updates, please contact [place] Please consult [email or web address] for contact info under        Signed, Buford Dresser, MD  04/16/2021, 9:24 AM

## 2021-04-16 NOTE — Progress Notes (Signed)
PROGRESS NOTE  Maria Hudson JJH:[id number] DOB: 1961-06-03   PCP: Nolene Ebbs, MD  Patient is from: Home.  Independent at baseline.  DOA: 04/14/2021 LOS: 2  Chief complaints:  Chief Complaint  Patient presents with   Shortness of Breath     Brief Narrative / Interim history: 60 year old F with PMH of diastolic CHF, DM-2, CKD-4, CVA, HTN and morbid obesity sent to ED from cardiology office by Dr. Oval Linsey for hypertensive urgency, 18 pounds weight gain and dyspnea, and admitted for acute on chronic diastolic CHF and hypertensive urgency.  Started on IV Lasix.  Cardiology following.  Subjective: C/o constipation  Objective: Vitals:   04/15/21 2351 04/16/21 0500 04/16/21 0845 04/16/21 1137  BP: (!) 158/79 (!) 175/75 (!) 182/74 (!) 155/69  Pulse: 66 65 63 65  Resp: 18 18  19   Temp: 97.9 F (36.6 C) 97.8 F (36.6 C) 97.6 F (36.4 C) 97.7 F (36.5 C)  TempSrc: Oral Oral Oral Oral  SpO2: 100% 100%  100%  Weight:  109.6 kg    Height:        Intake/Output Summary (Last 24 hours) at 04/16/2021 1237 Last data filed at 04/16/2021 1021 Gross per 24 hour  Intake 716 ml  Output 1950 ml  Net -1234 ml   Filed Weights   04/15/21 0047 04/16/21 0500  Weight: 110.9 kg 109.6 kg    Examination:   General: Appearance:    Severely obese female in no acute distress     Lungs:      respirations unlabored  Heart:    Normal heart rate.    MS:   All extremities are intact.    Neurologic:   Awake, alert, oriented x 3     Procedures:  None  Microbiology summarized: [id number] and influenza PCR nonreactive.  Assessment & Plan: Acute on chronic diastolic CHF: TTE in 09/9700 with LVEF of 55 to 60%, G1-DD.  Progressive dyspnea and edema despite Bumex and metolazone at home.  Reportedly about 18 pounds weight gain as well.  CXR with interstitial edema and mild cardiomegaly.  BNP 97 but could be falsely low given morbid obesity.  Likely due to uncontrolled hypertension.  Started on  IV Lasix.  Reports improvement in his symptoms but still with significant fluid overload.  I&O was not captured well. -Cardiology following -Seems she was referred for sleep study by cardiology already. -Sodium and fluid restriction  Uncontrolled hypertension/resistant hypertension: BP remains elevated but improved. -Continue Coreg, Cardura, Catapres, Imdur, hydralazine, amlodipine and Lasix- per cards -Needs outpatient sleep study.  CKD-4/azotemia: Renal function seems to be improving with diuretics. -Continue monitoring  Controlled DM-2 with CKD-4/hyperglycemia: A1c 6.7%. -Continue current insulin regimen and statin  Anemia of renal disease: Slight drop in Hgb from baseline but stable here.  History of CAD: Denies chest pain or anginal symptoms. -Continue Coreg, aspirin and a statin.  At risk for sleep apnea -Needs sleep study. -had bipap as an outpatient but has not worked in months  Depression: Stable. -Continue home Zoloft and Wellbutrin.  Hypokalemia: K3.3. -replete   Morbid obesity Body mass index is 42.82 kg/m.  -Encourage lifestyle change to lose weight.     Constipation -bowel regimen    DVT prophylaxis:  On subcu Lovenox.  Code Status: DNR/DNI Family Communication: Patient and/or RN. Available if any question.  Level of care: Telemetry Cardiac Status is: Inpatient  Remains inpatient appropriate because: Acute on chronic diastolic CHF and uncontrolled hypertension requiring IV diuretics  Consultants:  Cardiology   Sch Meds:  Scheduled Meds:  amLODipine  10 mg Oral q morning   aspirin EC  81 mg Oral Daily   atorvastatin  20 mg Oral QPM   buPROPion  100 mg Oral BID   carvedilol  25 mg Oral BID WC   cloNIDine  0.4 mg Oral TID   doxazosin  4 mg Oral QPM   enoxaparin (LOVENOX) injection  55 mg Subcutaneous QHS   ferrous sulfate  325 mg Oral BID   furosemide  40 mg Intravenous BID   hydrALAZINE  100 mg Oral TID   insulin aspart  0-20  Units Subcutaneous TID WC   insulin aspart  0-5 Units Subcutaneous QHS   isosorbide mononitrate  120 mg Oral Daily   loratadine  10 mg Oral Daily   omega-3 acid ethyl esters  1 capsule Oral Daily   pantoprazole  40 mg Oral Q1200   pregabalin  25 mg Oral BID   senna  2 tablet Oral Daily   sertraline  50 mg Oral Daily   sodium chloride flush  3 mL Intravenous Q12H   Continuous Infusions:  sodium chloride     PRN Meds:.sodium chloride, acetaminophen, nitroGLYCERIN, ondansetron **OR** ondansetron (ZOFRAN) IV, sodium chloride flush  Antimicrobials: Anti-infectives (From admission, onward)    None        I have personally reviewed the following labs and images: CBC: Recent Labs  Lab 04/14/21 1219 04/15/21 0412 04/16/21 0352  WBC 7.7 7.6 5.9  HGB 10.2* 10.1* 10.0*  HCT 30.7* 30.6* 31.5*  MCV 83.2 81.0 82.2  PLT 270 261 276   BMP &GFR Recent Labs  Lab 04/14/21 1219 04/15/21 0412 04/16/21 0352  NA 140 138 137  K 4.1 3.3* 3.8  CL 110 111 108  CO2 23 21* 24  GLUCOSE 93 141* 142*  BUN 45* 39* 40*  CREATININE 1.96* 1.83* 2.05*  CALCIUM 8.2* 8.6* 8.4*  MG  --   --  2.2  PHOS  --   --  4.4   Estimated Creatinine Clearance: 35.1 mL/min (A) (by C-G formula based on SCr of 2.05 mg/dL (H)). Liver & Pancreas: Recent Labs  Lab 04/15/21 0412 04/16/21 0352  AST 17  --   ALT 9  --   ALKPHOS 42  --   BILITOT 0.7  --   PROT 5.2*  --   ALBUMIN 2.6* 2.7*   No results for input(s): LIPASE, AMYLASE in the last 168 hours. No results for input(s): AMMONIA in the last 168 hours. Diabetic: Recent Labs    04/14/21 2045 04/15/21 0412  HGBA1C 6.7* 6.7*   Recent Labs  Lab 04/15/21 1118 04/15/21 1646 04/15/21 2117 04/16/21 0610 04/16/21 1134  GLUCAP 115* 123* 171* 133* 128*   Cardiac Enzymes: No results for input(s): CKTOTAL, CKMB, CKMBINDEX, TROPONINI in the last 168 hours. No results for input(s): PROBNP in the last 8760 hours. Coagulation Profile: No results for  input(s): INR, PROTIME in the last 168 hours. Thyroid Function Tests: No results for input(s): TSH, T4TOTAL, FREET4, T3FREE, THYROIDAB in the last 72 hours. Lipid Profile: No results for input(s): CHOL, HDL, LDLCALC, TRIG, CHOLHDL, LDLDIRECT in the last 72 hours. Anemia Panel: Recent Labs    04/16/21 0352  VITAMINB12 789  FOLATE 26.3  FERRITIN 400*  TIBC 200*  IRON 46  RETICCTPCT 2.0   Urine analysis:    Component Value Date/Time   COLORURINE YELLOW 03/04/2021 2147   APPEARANCEUR CLOUDY (A) 03/04/2021 2147  LABSPEC 1.025 03/04/2021 2147   PHURINE 5.5 03/04/2021 2147   GLUCOSEU NEGATIVE 03/04/2021 2147   HGBUR NEGATIVE 03/04/2021 2147   BILIRUBINUR NEGATIVE 03/04/2021 2147   KETONESUR NEGATIVE 03/04/2021 2147   PROTEINUR >300 (A) 03/04/2021 2147   NITRITE NEGATIVE 03/04/2021 2147   LEUKOCYTESUR LARGE (A) 03/04/2021 2147   Sepsis Labs: Invalid input(s): PROCALCITONIN, [place]  Microbiology: Recent Results (from the past 240 hour(s))  Resp Panel by RT-PCR (Flu A&B, Covid) Nasopharyngeal Swab     Status: None   Collection Time: 04/14/21  4:34 PM   Specimen: Nasopharyngeal Swab; Nasopharyngeal(NP) swabs in vial transport medium  Result Value Ref Range Status   SARS Coronavirus 2 by RT PCR NEGATIVE NEGATIVE Final    Comment: (NOTE) SARS-CoV-2 target nucleic acids are NOT DETECTED.  The SARS-CoV-2 RNA is generally detectable in upper respiratory specimens during the acute phase of infection. The lowest concentration of SARS-CoV-2 viral copies this assay can detect is 138 copies/mL. A negative result does not preclude SARS-Cov-2 infection and should not be used as the sole basis for treatment or other patient management decisions. A negative result may occur with  improper specimen collection/handling, submission of specimen other than nasopharyngeal swab, presence of viral mutation(s) within the areas targeted by this assay, and inadequate number of  viral copies(<138 copies/mL). A negative result must be combined with clinical observations, patient history, and epidemiological information. The expected result is Negative.  Fact Sheet for Patients:  EntrepreneurPulse.com.au  Fact Sheet for Healthcare Providers:  IncredibleEmployment.be  This test is no t yet approved or cleared by the [place] FDA and  has been authorized for detection and/or diagnosis of SARS-CoV-2 by FDA under an Emergency Use Authorization (EUA). This EUA will remain  in effect (meaning this test can be used) for the duration of the COVID-19 declaration under Section 564(b)(1) of the Act, 21 U.S.C.section 360bbb-3(b)(1), unless the authorization is terminated  or revoked sooner.       Influenza A by PCR NEGATIVE NEGATIVE Final   Influenza B by PCR NEGATIVE NEGATIVE Final    Comment: (NOTE) The Xpert Xpress SARS-CoV-2/FLU/RSV plus assay is intended as an aid in the diagnosis of influenza from Nasopharyngeal swab specimens and should not be used as a sole basis for treatment. Nasal washings and aspirates are unacceptable for Xpert Xpress SARS-CoV-2/FLU/RSV testing.  Fact Sheet for Patients: EntrepreneurPulse.com.au  Fact Sheet for Healthcare Providers: IncredibleEmployment.be  This test is not yet approved or cleared by the [place] FDA and has been authorized for detection and/or diagnosis of SARS-CoV-2 by FDA under an Emergency Use Authorization (EUA). This EUA will remain in effect (meaning this test can be used) for the duration of the COVID-19 declaration under Section 564(b)(1) of the Act, 21 U.S.C. section 360bbb-3(b)(1), unless the authorization is terminated or revoked.  Performed at [facility], [place] [address] [place], [place] [address]     Radiology Studies: No results found.    Eulogio Bear [facility]  If 7PM-7AM, please contact  night-coverage [email or web address] 04/16/2021, 12:37 PM

## 2021-04-16 NOTE — Progress Notes (Signed)
Mobility Specialist Progress Note:   04/16/21 1134  Mobility  Activity Ambulated in hall  Level of Assistance Standby assist, set-up cues, supervision of patient - no hands on  Assistive Device Front wheel walker  Distance Ambulated (ft) 550 ft  Mobility Ambulated with assistance in hallway  Mobility Response Tolerated well  Mobility performed by Mobility specialist  Bed Position Chair  $Mobility charge 1 Mobility   Pt received in chair willing to participate in mobility. No complaints of pain and asymptomatic. Pt returned to chair with call bell in reach and all needs met.   [facility] and safety inspector Phone [phone]

## 2021-04-17 DIAGNOSIS — E1122 Type 2 diabetes mellitus with diabetic chronic kidney disease: Secondary | ICD-10-CM | POA: Diagnosis not present

## 2021-04-17 DIAGNOSIS — I159 Secondary hypertension, unspecified: Secondary | ICD-10-CM | POA: Diagnosis not present

## 2021-04-17 DIAGNOSIS — Z794 Long term (current) use of insulin: Secondary | ICD-10-CM | POA: Diagnosis not present

## 2021-04-17 DIAGNOSIS — N184 Chronic kidney disease, stage 4 (severe): Secondary | ICD-10-CM | POA: Diagnosis not present

## 2021-04-17 DIAGNOSIS — N1832 Chronic kidney disease, stage 3b: Secondary | ICD-10-CM | POA: Diagnosis not present

## 2021-04-17 DIAGNOSIS — M109 Gout, unspecified: Secondary | ICD-10-CM | POA: Diagnosis not present

## 2021-04-17 DIAGNOSIS — I509 Heart failure, unspecified: Secondary | ICD-10-CM | POA: Diagnosis not present

## 2021-04-17 DIAGNOSIS — I10 Essential (primary) hypertension: Secondary | ICD-10-CM | POA: Diagnosis not present

## 2021-04-17 DIAGNOSIS — E669 Obesity, unspecified: Secondary | ICD-10-CM

## 2021-04-17 DIAGNOSIS — E8809 Other disorders of plasma-protein metabolism, not elsewhere classified: Secondary | ICD-10-CM | POA: Diagnosis not present

## 2021-04-17 DIAGNOSIS — I5033 Acute on chronic diastolic (congestive) heart failure: Secondary | ICD-10-CM | POA: Diagnosis not present

## 2021-04-17 DIAGNOSIS — E1169 Type 2 diabetes mellitus with other specified complication: Secondary | ICD-10-CM | POA: Diagnosis not present

## 2021-04-17 LAB — GLUCOSE, CAPILLARY
Glucose-Capillary: 141 mg/dL — ABNORMAL HIGH (ref 70–99)
Glucose-Capillary: 150 mg/dL — ABNORMAL HIGH (ref 70–99)
Glucose-Capillary: 139 mg/dL — ABNORMAL HIGH (ref 70–99)
Glucose-Capillary: 136 mg/dL — ABNORMAL HIGH (ref 70–99)
Glucose-Capillary: 91 mg/dL (ref 70–99)

## 2021-04-17 LAB — URIC ACID: Uric Acid, Serum: 8 mg/dL — ABNORMAL HIGH (ref 2.5–7.1)

## 2021-04-17 MED ORDER — PANTOPRAZOLE SODIUM 40 MG PO TBEC
40.0000 mg | DELAYED_RELEASE_TABLET | Freq: Two times a day (BID) | ORAL | Status: DC
  Administered 2021-04-17 – 2021-04-20 (×6): 40 mg via ORAL
  Filled 2021-04-17 (×6): qty 1

## 2021-04-17 MED ORDER — INSULIN GLARGINE-YFGN 100 UNIT/ML ~~LOC~~ SOLN
20.0000 [IU] | Freq: Every day | SUBCUTANEOUS | Status: DC
  Filled 2021-04-17: qty 0.2

## 2021-04-17 MED ORDER — SORBITOL 70 % SOLN
960.0000 mL | TOPICAL_OIL | Freq: Once | ORAL | Status: DC
  Filled 2021-04-17: qty 473

## 2021-04-17 MED ORDER — SODIUM BICARBONATE 650 MG PO TABS
650.0000 mg | ORAL_TABLET | Freq: Two times a day (BID) | ORAL | Status: DC
  Administered 2021-04-17 – 2021-04-18 (×4): 650 mg via ORAL
  Filled 2021-04-17 (×5): qty 1

## 2021-04-17 MED ORDER — SORBITOL 70 % SOLN
30.0000 mL | Freq: Every day | Status: DC | PRN
  Filled 2021-04-17: qty 30

## 2021-04-17 MED ORDER — PREDNISONE 20 MG PO TABS
20.0000 mg | ORAL_TABLET | Freq: Every day | ORAL | Status: AC
  Administered 2021-04-18 – 2021-04-19 (×2): 20 mg via ORAL
  Filled 2021-04-17 (×2): qty 1

## 2021-04-17 MED ORDER — BISACODYL 10 MG RE SUPP
10.0000 mg | Freq: Once | RECTAL | Status: AC
  Administered 2021-04-17: 10 mg via RECTAL
  Filled 2021-04-17: qty 1

## 2021-04-17 MED ORDER — DOXAZOSIN MESYLATE 8 MG PO TABS
8.0000 mg | ORAL_TABLET | Freq: Every evening | ORAL | Status: DC
  Administered 2021-04-17 – 2021-04-19 (×3): 8 mg via ORAL
  Filled 2021-04-17 (×3): qty 1

## 2021-04-17 NOTE — Progress Notes (Signed)
TRH night cross cover note:  I was notified by RN regarding the patient's concern for development of fasting a.m. hypoglycemia tomorrow morning.  She is currently ordered her home basal dose of Lantus 20 units subcu nightly. QHS cbg this evening noted to be 91.  In the context of hemoglobin A1c 6.7%, the patient conveys that she typically holds her evening Lantus as an outpatient when her nightly CBGs are under 100 in order to reduce risk for fasting a.m. hypoglycemia.  Per chart review, it appears that the 20 units of Lantus ordered for this evening represents the first order for resumption of her outpatient basal insulin during this hospitalization.  In the absence of basal insulin, her fasting a.m. blood sugar this morning was 141.  I subsequently discontinued order for her QHS basal insulin, with plan for monitoring ensuing result of tomorrow's fasting a.m. blood sugar to help provide further guidance regarding her basal insulin.    Babs Bertin, DO Hospitalist

## 2021-04-17 NOTE — Progress Notes (Signed)
Heart Failure Nurse Navigator Progress Note  Scheduled HV TOC f/u appt for 11/10 @ 9AM.  Pt states she has transportation.   Pricilla Holm, MSN, RN Heart Failure Nurse Navigator [phone]

## 2021-04-17 NOTE — Progress Notes (Signed)
PROGRESS NOTE  Maria Hudson YOV:[id number] DOB: 06/02/1961   PCP: Nolene Ebbs, MD  Patient is from: Home.  Independent at baseline.  DOA: 04/14/2021 LOS: 3  Chief complaints:  Chief Complaint  Patient presents with   Shortness of Breath     Brief Narrative / Interim history: 60 year old F with PMH of diastolic CHF, DM-2, CKD-4, CVA, HTN and morbid obesity sent to ED from cardiology office by Dr. Oval Linsey for hypertensive urgency, 18 pounds weight gain and dyspnea, and admitted for acute on chronic diastolic CHF and hypertensive urgency.  Started on IV Lasix.  Cardiology following.  Subjective: Still with constipation, also c/o right knee pain- similar to her flares of gout in the past  Objective: Vitals:   04/16/21 1950 04/16/21 2202 04/17/21 0336 04/17/21 1150  BP: (!) 183/80 (!) 174/74 (!) 189/76 (!) 191/78  Pulse: 65  64 70  Resp: 14  14 17   Temp: 98.4 F (36.9 C)  97.7 F (36.5 C) 97.9 F (36.6 C)  TempSrc: Oral  Oral Oral  SpO2: 100%  98% 99%  Weight:   108.3 kg   Height:        Intake/Output Summary (Last 24 hours) at 04/17/2021 1445 Last data filed at 04/17/2021 1332 Gross per 24 hour  Intake 700 ml  Output 2900 ml  Net -2200 ml   Filed Weights   04/15/21 0047 04/16/21 0500 04/17/21 0336  Weight: 110.9 kg 109.6 kg 108.3 kg    Examination:   General: Appearance:    Severely obese female in no acute distress     Lungs:     respirations unlabored  Heart:    Normal heart rate.    MS:   All extremities are intact.    Neurologic:   Awake, alert     Microbiology summarized: [id number] and influenza PCR nonreactive.  Assessment & Plan: Acute on chronic diastolic CHF: TTE in 11/7670 with LVEF of 55 to 60%, G1-DD.  Progressive dyspnea and edema despite Bumex and metolazone at home.  Reportedly about 18 pounds weight gain as well.  CXR with interstitial edema and mild cardiomegaly.  BNP 97 but could be falsely low given morbid obesity.  Likely due to  uncontrolled hypertension.  Started on IV Lasix.  Reports improvement in his symptoms but still with significant fluid overload.  I&O was not captured well. -Cardiology following -Seems she was referred for sleep study by cardiology already. -Sodium and fluid restriction  Uncontrolled hypertension/resistant hypertension: BP remains elevated but improved. -Continue Coreg, Cardura, Catapres, Imdur, hydralazine, amlodipine and Lasix- per cards -Needs outpatient sleep study.  CKD-4/azotemia: Renal function seems to be improving with diuretics. -Continue monitoring  Controlled DM-2 with CKD-4/hyperglycemia: A1c 6.7%. -Continue current insulin regimen and statin  Anemia of renal disease: Slight drop in Hgb from baseline but stable here.  History of CAD: Denies chest pain or anginal symptoms. -Continue Coreg, aspirin and a statin.  At risk for sleep apnea -Needs sleep study. -had bipap as an outpatient but has not worked in months -BIPAP while here  Depression: Stable. -Continue home Zoloft and Wellbutrin.  Hypokalemia: K3.3. -replete   Morbid obesity Body mass index is 42.28 kg/m.  -Encourage lifestyle change to lose weight.   ? Gout -uric acid elevated -prednisone as Cr up   Constipation -bowel regimen    DVT prophylaxis:  On subcu Lovenox.  Code Status: DNR/DNI Level of care: Telemetry Cardiac Status is: Inpatient  Remains inpatient appropriate because: Acute on chronic diastolic CHF and uncontrolled  hypertension requiring IV diuretics       Consultants:  Cardiology   Sch Meds:  Scheduled Meds:  amLODipine  10 mg Oral q morning   aspirin EC  81 mg Oral Daily   atorvastatin  20 mg Oral QPM   buPROPion  100 mg Oral BID   carvedilol  25 mg Oral BID WC   cloNIDine  0.4 mg Oral TID   doxazosin  8 mg Oral QPM   enoxaparin (LOVENOX) injection  55 mg Subcutaneous QHS   ferrous sulfate  325 mg Oral BID   furosemide  40 mg Intravenous BID   hydrALAZINE  100  mg Oral TID   insulin aspart  0-20 Units Subcutaneous TID WC   insulin aspart  0-5 Units Subcutaneous QHS   insulin glargine-yfgn  20 Units Subcutaneous QHS   isosorbide mononitrate  120 mg Oral Daily   loratadine  10 mg Oral Daily   omega-3 acid ethyl esters  1 capsule Oral Daily   pantoprazole  40 mg Oral BID AC   [START ON 04/18/2021] predniSONE  20 mg Oral Q breakfast   pregabalin  25 mg Oral BID   senna  2 tablet Oral Daily   sertraline  50 mg Oral Daily   sodium bicarbonate  650 mg Oral BID   sodium chloride flush  3 mL Intravenous Q12H   sorbitol, milk of mag, mineral oil, glycerin (SMOG) enema  960 mL Rectal Once   Continuous Infusions:  sodium chloride     PRN Meds:.sodium chloride, acetaminophen, nitroGLYCERIN, ondansetron **OR** ondansetron (ZOFRAN) IV, sodium chloride flush, sorbitol  Antimicrobials: Anti-infectives (From admission, onward)    None        I have personally reviewed the following labs and images: CBC: Recent Labs  Lab 04/14/21 1219 04/15/21 0412 04/16/21 0352  WBC 7.7 7.6 5.9  HGB 10.2* 10.1* 10.0*  HCT 30.7* 30.6* 31.5*  MCV 83.2 81.0 82.2  PLT 270 261 276   BMP &GFR Recent Labs  Lab 04/14/21 1219 04/15/21 0412 04/16/21 0352  NA 140 138 137  K 4.1 3.3* 3.8  CL 110 111 108  CO2 23 21* 24  GLUCOSE 93 141* 142*  BUN 45* 39* 40*  CREATININE 1.96* 1.83* 2.05*  CALCIUM 8.2* 8.6* 8.4*  MG  --   --  2.2  PHOS  --   --  4.4   Estimated Creatinine Clearance: 34.9 mL/min (A) (by C-G formula based on SCr of 2.05 mg/dL (H)). Liver & Pancreas: Recent Labs  Lab 04/15/21 0412 04/16/21 0352  AST 17  --   ALT 9  --   ALKPHOS 42  --   BILITOT 0.7  --   PROT 5.2*  --   ALBUMIN 2.6* 2.7*   No results for input(s): LIPASE, AMYLASE in the last 168 hours. No results for input(s): AMMONIA in the last 168 hours. Diabetic: Recent Labs    04/14/21 2045 04/15/21 0412  HGBA1C 6.7* 6.7*   Recent Labs  Lab 04/16/21 1650 04/16/21 2110  04/17/21 0543 04/17/21 1122 04/17/21 1148  GLUCAP 102* 145* 141* 150* 139*   Cardiac Enzymes: No results for input(s): CKTOTAL, CKMB, CKMBINDEX, TROPONINI in the last 168 hours. No results for input(s): PROBNP in the last 8760 hours. Coagulation Profile: No results for input(s): INR, PROTIME in the last 168 hours. Thyroid Function Tests: No results for input(s): TSH, T4TOTAL, FREET4, T3FREE, THYROIDAB in the last 72 hours. Lipid Profile: No results for input(s): CHOL, HDL, LDLCALC, TRIG,  CHOLHDL, LDLDIRECT in the last 72 hours. Anemia Panel: Recent Labs    04/16/21 0352  VITAMINB12 789  FOLATE 26.3  FERRITIN 400*  TIBC 200*  IRON 46  RETICCTPCT 2.0   Urine analysis:    Component Value Date/Time   COLORURINE YELLOW 03/04/2021 2147   APPEARANCEUR CLOUDY (A) 03/04/2021 2147   LABSPEC 1.025 03/04/2021 2147   PHURINE 5.5 03/04/2021 2147   GLUCOSEU NEGATIVE 03/04/2021 2147   HGBUR NEGATIVE 03/04/2021 2147   BILIRUBINUR NEGATIVE 03/04/2021 2147   KETONESUR NEGATIVE 03/04/2021 2147   PROTEINUR >300 (A) 03/04/2021 2147   NITRITE NEGATIVE 03/04/2021 2147   LEUKOCYTESUR LARGE (A) 03/04/2021 2147   Sepsis Labs: Invalid input(s): PROCALCITONIN, [place]  Microbiology: Recent Results (from the past 240 hour(s))  Resp Panel by RT-PCR (Flu A&B, Covid) Nasopharyngeal Swab     Status: None   Collection Time: 04/14/21  4:34 PM   Specimen: Nasopharyngeal Swab; Nasopharyngeal(NP) swabs in vial transport medium  Result Value Ref Range Status   SARS Coronavirus 2 by RT PCR NEGATIVE NEGATIVE Final    Comment: (NOTE) SARS-CoV-2 target nucleic acids are NOT DETECTED.  The SARS-CoV-2 RNA is generally detectable in upper respiratory specimens during the acute phase of infection. The lowest concentration of SARS-CoV-2 viral copies this assay can detect is 138 copies/mL. A negative result does not preclude SARS-Cov-2 infection and should not be used as the sole basis for treatment  or other patient management decisions. A negative result may occur with  improper specimen collection/handling, submission of specimen other than nasopharyngeal swab, presence of viral mutation(s) within the areas targeted by this assay, and inadequate number of viral copies(<138 copies/mL). A negative result must be combined with clinical observations, patient history, and epidemiological information. The expected result is Negative.  Fact Sheet for Patients:  EntrepreneurPulse.com.au  Fact Sheet for Healthcare Providers:  IncredibleEmployment.be  This test is no t yet approved or cleared by the [place] FDA and  has been authorized for detection and/or diagnosis of SARS-CoV-2 by FDA under an Emergency Use Authorization (EUA). This EUA will remain  in effect (meaning this test can be used) for the duration of the COVID-19 declaration under Section 564(b)(1) of the Act, 21 U.S.C.section 360bbb-3(b)(1), unless the authorization is terminated  or revoked sooner.       Influenza A by PCR NEGATIVE NEGATIVE Final   Influenza B by PCR NEGATIVE NEGATIVE Final    Comment: (NOTE) The Xpert Xpress SARS-CoV-2/FLU/RSV plus assay is intended as an aid in the diagnosis of influenza from Nasopharyngeal swab specimens and should not be used as a sole basis for treatment. Nasal washings and aspirates are unacceptable for Xpert Xpress SARS-CoV-2/FLU/RSV testing.  Fact Sheet for Patients: EntrepreneurPulse.com.au  Fact Sheet for Healthcare Providers: IncredibleEmployment.be  This test is not yet approved or cleared by the [place] FDA and has been authorized for detection and/or diagnosis of SARS-CoV-2 by FDA under an Emergency Use Authorization (EUA). This EUA will remain in effect (meaning this test can be used) for the duration of the COVID-19 declaration under Section 564(b)(1) of the Act, 21 U.S.C. section  360bbb-3(b)(1), unless the authorization is terminated or revoked.  Performed at [facility], [place] [address] [place], [place] [address]     Radiology Studies: No results found.    Eulogio Bear [facility]  If 7PM-7AM, please contact night-coverage [email or web address] 04/17/2021, 2:45 PM

## 2021-04-17 NOTE — Progress Notes (Signed)
Progress Note  Patient Name: Maria Hudson Date of Encounter: 04/17/2021  [facility] Cardiologist: Skeet Latch, MD   Subjective   No acute events overnight. Continues to diurese well. Reviewed plan for medications. We also discussed weight loss, including GLP1RA.  Inpatient Medications    Scheduled Meds:  amLODipine  10 mg Oral q morning   aspirin EC  81 mg Oral Daily   atorvastatin  20 mg Oral QPM   bisacodyl  10 mg Rectal Once   buPROPion  100 mg Oral BID   carvedilol  25 mg Oral BID WC   cloNIDine  0.4 mg Oral TID   doxazosin  8 mg Oral QPM   enoxaparin (LOVENOX) injection  55 mg Subcutaneous QHS   ferrous sulfate  325 mg Oral BID   furosemide  40 mg Intravenous BID   hydrALAZINE  100 mg Oral TID   insulin aspart  0-20 Units Subcutaneous TID WC   insulin aspart  0-5 Units Subcutaneous QHS   isosorbide mononitrate  120 mg Oral Daily   loratadine  10 mg Oral Daily   omega-3 acid ethyl esters  1 capsule Oral Daily   pantoprazole  40 mg Oral BID AC   pregabalin  25 mg Oral BID   senna  2 tablet Oral Daily   sertraline  50 mg Oral Daily   sodium chloride flush  3 mL Intravenous Q12H   sorbitol, milk of mag, mineral oil, glycerin (SMOG) enema  960 mL Rectal Once   Continuous Infusions:  sodium chloride     PRN Meds: sodium chloride, acetaminophen, nitroGLYCERIN, ondansetron **OR** ondansetron (ZOFRAN) IV, sodium chloride flush, sorbitol   Vital Signs    Vitals:   04/16/21 1820 04/16/21 1950 04/16/21 2202 04/17/21 0336  BP: (!) 142/67 (!) 183/80 (!) 174/74 (!) 189/76  Pulse: 67 65  64  Resp:  14  14  Temp:  98.4 F (36.9 C)  97.7 F (36.5 C)  TempSrc:  Oral  Oral  SpO2:  100%  98%  Weight:    108.3 kg  Height:        Intake/Output Summary (Last 24 hours) at 04/17/2021 1057 Last data filed at 04/17/2021 0342 Gross per 24 hour  Intake 720 ml  Output 2100 ml  Net -1380 ml   Last 3 Weights 04/17/2021 04/16/2021 04/15/2021  Weight (lbs) 238 lb 11.2  oz 241 lb 11.2 oz 244 lb 6.4 oz  Weight (kg) 108.274 kg 109.634 kg 110.859 kg      Telemetry    NSR - Personally Reviewed  ECG    No new since 10/31 - Personally Reviewed  Physical Exam   GEN: Well nourished, well developed in no acute distress HEENT: Normal, moist mucous membranes NECK: No JVD appreciated but difficult body habitus CARDIAC: regular rhythm, normal S1 and S2, no rubs or gallops. 1/6 systolic murmur. VASCULAR: Radial and DP pulses 2+ bilaterally. No carotid bruits RESPIRATORY:  Clear to auscultation without rales, wheezing or rhonchi  ABDOMEN: Soft, non-tender, non-distended MUSCULOSKELETAL:  Ambulates independently SKIN: Warm and dry, 1-2+ bilateral LE edema NEUROLOGIC:  Alert and oriented x 3. No focal neuro deficits noted. PSYCHIATRIC:  Normal affect    Labs    High Sensitivity Troponin:  No results for input(s): TROPONINIHS in the last 720 hours.   Chemistry Recent Labs  Lab 04/14/21 1219 04/15/21 0412 04/16/21 0352  NA 140 138 137  K 4.1 3.3* 3.8  CL 110 111 108  CO2 23 21* 24  GLUCOSE 93 141* 142*  BUN 45* 39* 40*  CREATININE 1.96* 1.83* 2.05*  CALCIUM 8.2* 8.6* 8.4*  MG  --   --  2.2  PROT  --  5.2*  --   ALBUMIN  --  2.6* 2.7*  AST  --  17  --   ALT  --  9  --   ALKPHOS  --  42  --   BILITOT  --  0.7  --   GFRNONAA 29* 31* 27*  ANIONGAP 7 6 5     Lipids No results for input(s): CHOL, TRIG, HDL, LABVLDL, LDLCALC, CHOLHDL in the last 168 hours.  Hematology Recent Labs  Lab 04/14/21 1219 04/15/21 0412 04/16/21 0352  WBC 7.7 7.6 5.9  RBC 3.69* 3.78* 3.83*  3.78*  HGB 10.2* 10.1* 10.0*  HCT 30.7* 30.6* 31.5*  MCV 83.2 81.0 82.2  MCH 27.6 26.7 26.1  MCHC 33.2 33.0 31.7  RDW 18.6* 18.4* 18.5*  PLT 270 261 276   Thyroid No results for input(s): TSH, FREET4 in the last 168 hours.  BNP Recent Labs  Lab 04/14/21 1219  BNP 97.1    DDimer No results for input(s): DDIMER in the last 168 hours.   Radiology    No results  found.  Cardiac Studies   Echo 03/06/21  1. Left ventricular ejection fraction, by estimation, is 55 to 60%. The  left ventricle has normal function. The left ventricle has no regional  wall motion abnormalities. There is mild left ventricular hypertrophy.  Left ventricular diastolic parameters  are consistent with Grade I diastolic dysfunction (impaired relaxation).  Elevated left atrial pressure.   2. Right ventricular systolic function is normal. The right ventricular  size is normal.   3. Left atrial size was mildly dilated.   4. The mitral valve is normal in structure. Trivial mitral valve  regurgitation. No evidence of mitral stenosis.   5. The aortic valve is tricuspid. Aortic valve regurgitation is not  visualized. No aortic stenosis is present.   6. The inferior vena cava is normal in size with greater than 50%  respiratory variability, suggesting right atrial pressure of 3 mmHg.  Patient Profile     60 y.o. female with a hx of CAD, chronic diastolic heart failure, type II diabetes, chronic kidney disease stage 4, hypertension, history of stroke who is being seen in consultation for the evaluation of acute decompensated diastolic heart failure at the request of Dr. Darrick Meigs.  Assessment & Plan    Acute on chronic diastolic heart failure Chronic kidney disease, stage 4 Hypoalbuminemia -albumin 2.6-2.7, likely contributing to third spacing -I/O inaccurate, weight 110.9 kg on admission, weight today 108.3 kg -BNP 97, suspect falsely low due to obesity -GFR has typically been <30, so no SGLT2i started. If renal function improves, would reassess -labs pending today   Labile vs uncontrolled hypertension -followed closely by Dr. Oval Linsey and nephrology team as an outpatient. -at home, on carvedilol 25 mg BID, hydralazine 100 mg TID, amlodipine 10 mg daily, doxazosin 2 mg PM, imdur 60 mg daily -increased doxazosin to 4 mg in the evening, slight improvement in BP. Will increase to 8  mg PM today -increased imdur to 120 mg daily without improvement -renin:angiotensin workup unrevealing, as were metanephrines. -had elevated Cr on losartan. If function/potassium allows, could try MRA, but may not be able to use given her CKD -minoxidil can cause fluid retention, will not start until her volume status is improved. Once started, will need to titrate slowly,  monitor for orthostatic hypotension to determine optimum dosing. Need to monitor for angina, ECG changes, and be mindful of risk for pericardial effusion   Type II diabetes Obesity -A1c 6.7 -see above re: SGLT2i -on insulin chronically -weight loss would likely help her blood pressure. We discussed GLP1RA therapy as an outpatient. She is very interested in this.    For now, continue diuresis and we will continue to titrate BP meds. If renal function worsens significantly, would need to get nephrology involved.  For questions or updates, please contact [place] Please consult [email or web address] for contact info under        Signed, Buford Dresser, MD  04/17/2021, 10:57 AM

## 2021-04-18 DIAGNOSIS — M109 Gout, unspecified: Secondary | ICD-10-CM | POA: Diagnosis not present

## 2021-04-18 DIAGNOSIS — N184 Chronic kidney disease, stage 4 (severe): Secondary | ICD-10-CM | POA: Diagnosis not present

## 2021-04-18 DIAGNOSIS — Z794 Long term (current) use of insulin: Secondary | ICD-10-CM | POA: Diagnosis not present

## 2021-04-18 DIAGNOSIS — I159 Secondary hypertension, unspecified: Secondary | ICD-10-CM

## 2021-04-18 DIAGNOSIS — N1832 Chronic kidney disease, stage 3b: Secondary | ICD-10-CM | POA: Diagnosis not present

## 2021-04-18 DIAGNOSIS — E669 Obesity, unspecified: Secondary | ICD-10-CM | POA: Diagnosis not present

## 2021-04-18 DIAGNOSIS — E1169 Type 2 diabetes mellitus with other specified complication: Secondary | ICD-10-CM | POA: Diagnosis not present

## 2021-04-18 DIAGNOSIS — E1122 Type 2 diabetes mellitus with diabetic chronic kidney disease: Secondary | ICD-10-CM | POA: Diagnosis not present

## 2021-04-18 DIAGNOSIS — I509 Heart failure, unspecified: Secondary | ICD-10-CM | POA: Diagnosis not present

## 2021-04-18 DIAGNOSIS — I5033 Acute on chronic diastolic (congestive) heart failure: Secondary | ICD-10-CM | POA: Diagnosis not present

## 2021-04-18 DIAGNOSIS — I130 Hypertensive heart and chronic kidney disease with heart failure and stage 1 through stage 4 chronic kidney disease, or unspecified chronic kidney disease: Secondary | ICD-10-CM | POA: Diagnosis not present

## 2021-04-18 DIAGNOSIS — N179 Acute kidney failure, unspecified: Secondary | ICD-10-CM | POA: Diagnosis not present

## 2021-04-18 DIAGNOSIS — D631 Anemia in chronic kidney disease: Secondary | ICD-10-CM | POA: Diagnosis not present

## 2021-04-18 DIAGNOSIS — I10 Essential (primary) hypertension: Secondary | ICD-10-CM | POA: Diagnosis not present

## 2021-04-18 DIAGNOSIS — E8779 Other fluid overload: Secondary | ICD-10-CM | POA: Diagnosis not present

## 2021-04-18 DIAGNOSIS — E8809 Other disorders of plasma-protein metabolism, not elsewhere classified: Secondary | ICD-10-CM | POA: Diagnosis not present

## 2021-04-18 DIAGNOSIS — I5030 Unspecified diastolic (congestive) heart failure: Secondary | ICD-10-CM | POA: Diagnosis not present

## 2021-04-18 DIAGNOSIS — E1129 Type 2 diabetes mellitus with other diabetic kidney complication: Secondary | ICD-10-CM | POA: Diagnosis not present

## 2021-04-18 LAB — GLUCOSE, CAPILLARY
Glucose-Capillary: 146 mg/dL — ABNORMAL HIGH (ref 70–99)
Glucose-Capillary: 138 mg/dL — ABNORMAL HIGH (ref 70–99)
Glucose-Capillary: 163 mg/dL — ABNORMAL HIGH (ref 70–99)
Glucose-Capillary: 261 mg/dL — ABNORMAL HIGH (ref 70–99)
Glucose-Capillary: 179 mg/dL — ABNORMAL HIGH (ref 70–99)

## 2021-04-18 LAB — PROTEIN / CREATININE RATIO, URINE
Creatinine, Urine: 97.33 mg/dL
Protein Creatinine Ratio: 2.36 mg/mg{Cre} — ABNORMAL HIGH (ref 0.00–0.15)
Total Protein, Urine: 230 mg/dL

## 2021-04-18 LAB — BASIC METABOLIC PANEL
Anion gap: 8 (ref 5–15)
BUN: 43 mg/dL — ABNORMAL HIGH (ref 6–20)
CO2: 24 mmol/L (ref 22–32)
Calcium: 8.6 mg/dL — ABNORMAL LOW (ref 8.9–10.3)
Chloride: 107 mmol/L (ref 98–111)
Creatinine, Ser: 2.42 mg/dL — ABNORMAL HIGH (ref 0.44–1.00)
GFR, Estimated: 22 mL/min — ABNORMAL LOW (ref >60)
Glucose, Bld: 138 mg/dL — ABNORMAL HIGH (ref 70–99)
Potassium: 3.8 mmol/L (ref 3.5–5.1)
Sodium: 139 mmol/L (ref 135–145)

## 2021-04-18 MED ORDER — FUROSEMIDE 10 MG/ML IJ SOLN: 40.0000 mg | Freq: Every day | INTRAMUSCULAR | Status: DC

## 2021-04-18 MED ORDER — HYDRALAZINE HCL 20 MG/ML IJ SOLN
10.0000 mg | INTRAMUSCULAR | Status: DC | PRN
  Administered 2021-04-18 – 2021-04-20 (×3): 10 mg via INTRAVENOUS
  Filled 2021-04-18 (×3): qty 1

## 2021-04-18 MED ORDER — BISACODYL 10 MG RE SUPP
10.0000 mg | Freq: Every day | RECTAL | Status: DC | PRN
  Administered 2021-04-18: 10 mg via RECTAL
  Filled 2021-04-18: qty 1

## 2021-04-18 MED ORDER — POTASSIUM CHLORIDE CRYS ER 20 MEQ PO TBCR
20.0000 meq | EXTENDED_RELEASE_TABLET | Freq: Once | ORAL | Status: AC
  Administered 2021-04-18: 20 meq via ORAL
  Filled 2021-04-18: qty 1

## 2021-04-18 MED ORDER — CHLORTHALIDONE 25 MG PO TABS
25.0000 mg | ORAL_TABLET | Freq: Every day | ORAL | Status: DC
  Administered 2021-04-18 – 2021-04-19 (×2): 25 mg via ORAL
  Filled 2021-04-18 (×3): qty 1

## 2021-04-18 NOTE — Progress Notes (Signed)
Mobility Specialist Progress Note:   04/18/21 1256  Mobility  Activity Ambulated in hall  Level of Assistance Standby assist, set-up cues, supervision of patient - no hands on  Assistive Device None  Distance Ambulated (ft) 800 ft  Mobility Ambulated with assistance in hallway  Mobility Response Tolerated well  Mobility performed by Mobility specialist  Bed Position Chair  $Mobility charge 1 Mobility   Pt received in chair willing to participate in mobility. No complaints of pain. Pt feeling more steady and was able to ambulate without the RW. Pt left in chair with call bell in reach and all needs met.   [facility] Health and safety inspector Phone [phone]

## 2021-04-18 NOTE — TOC Progression Note (Signed)
Transition of Care [facility]) - Progression Note    Patient Details  Name: Maria Hudson MRN: [id number] Date of Birth: 06/26/1960  [facility] CM/SW Contact  Zenon Mayo, RN Phone Number: 04/18/2021, 3:58 PM  Clinical Narrative:    From home, CHF ex, continue diuresing with iv lasix, nephrology consulted for refractory HTN.  TOC will continue to follow for dc needs.         Expected Discharge Plan and Services                                                 Social Determinants of Health (SDOH) Interventions Food Insecurity Interventions: Intervention Not Indicated Financial Strain Interventions: Intervention Not Indicated Housing Interventions: Intervention Not Indicated Transportation Interventions: Intervention Not Indicated  Readmission Risk Interventions No flowsheet data found.

## 2021-04-18 NOTE — Progress Notes (Signed)
PROGRESS NOTE  Maria Hudson OZH:[id number] DOB: [birth year]   PCP: Nolene Ebbs, MD  Patient is from: Home.  Independent at baseline.  DOA: 04/14/2021 LOS: 4  Chief complaints:  Chief Complaint  Patient presents with   Shortness of Breath     Brief Narrative / Interim history: 60 year old F with PMH of diastolic CHF, DM-2, CKD-4, CVA, HTN and morbid obesity sent to ED from cardiology office by Dr. Oval Linsey for hypertensive urgency, 18 pounds weight gain and dyspnea, and admitted for acute on chronic diastolic CHF and hypertensive urgency.  Started on IV Lasix. Cardiology following. Nephrology consulted for refractory HTN  Subjective: Knee feeling better  Objective: Vitals:   04/18/21 0105 04/18/21 0452 04/18/21 0654 04/18/21 1128  BP: (!) 191/79 (!) 210/76 (!) 180/77 (!) 182/76  Pulse: 62 62  61  Resp: 19 18  18   Temp: 97.8 F (36.6 C) 97.7 F (36.5 C)  98 F (36.7 C)  TempSrc: Oral Oral  Oral  SpO2: 100% 100%  98%  Weight:   106.9 kg   Height:        Intake/Output Summary (Last 24 hours) at 04/18/2021 1357 Last data filed at 04/18/2021 1029 Gross per 24 hour  Intake 460 ml  Output --  Net 460 ml   Filed Weights   04/16/21 0500 04/17/21 0336 04/18/21 0654  Weight: 109.6 kg 108.3 kg 106.9 kg    Examination:   General: Appearance:    Severely obese female in no acute distress     Lungs:      respirations unlabored  Heart:    Normal heart rate.   MS:   All extremities are intact.    Neurologic:   Awake, alert, oriented x 3. No apparent focal neurological           defect.        Microbiology summarized: COVID-19 and influenza PCR nonreactive.  Assessment & Plan: Acute on chronic diastolic CHF: TTE in 11/2950 with LVEF of 55 to 60%, G1-DD.  Progressive dyspnea and edema despite Bumex and metolazone at home.  Reportedly about 18 pounds weight gain as well.  CXR with interstitial edema and mild cardiomegaly.  BNP 97 but could be falsely low given morbid  obesity.  Likely due to uncontrolled hypertension.  Started on IV Lasix.  Reports improvement in his symptoms but still with significant fluid overload.  I&O was not captured well. -Cardiology following -Seems she was referred for sleep study by cardiology already- CPAP added QHS -Sodium and fluid restriction  Uncontrolled hypertension/resistant hypertension: BP remains elevated but improved. -Continue Coreg, Cardura, Catapres, Imdur, hydralazine, amlodipine and Lasix- per cards -Needs outpatient sleep study. -nephrology consult for refractory HTN  CKD-4/azotemia:  -Continue monitoring  Controlled DM-2 with CKD-4/hyperglycemia: A1c 6.7%. -Continue current insulin regimen and statin  Anemia of renal disease: Slight drop in Hgb from baseline but stable here.  History of CAD: Denies chest pain or anginal symptoms. -Continue Coreg, aspirin and a statin.  At risk for sleep apnea -Needs sleep study. -had bipap as an outpatient but has not worked in months -BIPAP while here  Depression: Stable. -Continue home Zoloft and Wellbutrin.  Hypokalemia: K3.3. -replete   Morbid obesity Body mass index is 41.73 kg/m.  -Encourage lifestyle change to lose weight.   ? Gout -uric acid elevated -prednisone as Cr up x 3 days   Constipation -bowel regimen    DVT prophylaxis:  On subcu Lovenox.  Code Status: DNR/DNI Level of care: Telemetry Cardiac Status  is: Inpatient  Remains inpatient appropriate because: Acute on chronic diastolic CHF and uncontrolled hypertension requiring IV diuretics       Consultants:  Cardiology nephrology   Sch Meds:  Scheduled Meds:  amLODipine  10 mg Oral q morning   aspirin EC  81 mg Oral Daily   atorvastatin  20 mg Oral QPM   buPROPion  100 mg Oral BID   carvedilol  25 mg Oral BID WC   chlorthalidone  25 mg Oral Daily   cloNIDine  0.4 mg Oral TID   doxazosin  8 mg Oral QPM   enoxaparin (LOVENOX) injection  55 mg Subcutaneous QHS    ferrous sulfate  325 mg Oral BID   [START ON 04/19/2021] furosemide  40 mg Intravenous Daily   hydrALAZINE  100 mg Oral TID   insulin aspart  0-20 Units Subcutaneous TID WC   insulin aspart  0-5 Units Subcutaneous QHS   isosorbide mononitrate  120 mg Oral Daily   loratadine  10 mg Oral Daily   omega-3 acid ethyl esters  1 capsule Oral Daily   pantoprazole  40 mg Oral BID AC   predniSONE  20 mg Oral Q breakfast   pregabalin  25 mg Oral BID   senna  2 tablet Oral Daily   sertraline  50 mg Oral Daily   sodium bicarbonate  650 mg Oral BID   sodium chloride flush  3 mL Intravenous Q12H   sorbitol, milk of mag, mineral oil, glycerin (SMOG) enema  960 mL Rectal Once   Continuous Infusions:  sodium chloride     PRN Meds:.sodium chloride, acetaminophen, bisacodyl, hydrALAZINE, nitroGLYCERIN, ondansetron **OR** ondansetron (ZOFRAN) IV, sodium chloride flush, sorbitol  Antimicrobials: Anti-infectives (From admission, onward)    None        I have personally reviewed the following labs and images: CBC: Recent Labs  Lab 04/14/21 1219 04/15/21 0412 04/16/21 0352  WBC 7.7 7.6 5.9  HGB 10.2* 10.1* 10.0*  HCT 30.7* 30.6* 31.5*  MCV 83.2 81.0 82.2  PLT 270 261 276   BMP &GFR Recent Labs  Lab 04/14/21 1219 04/15/21 0412 04/16/21 0352 04/18/21 0350  NA 140 138 137 139  K 4.1 3.3* 3.8 3.8  CL 110 111 108 107  CO2 23 21* 24 24  GLUCOSE 93 141* 142* 138*  BUN 45* 39* 40* 43*  CREATININE 1.96* 1.83* 2.05* 2.42*  CALCIUM 8.2* 8.6* 8.4* 8.6*  MG  --   --  2.2  --   PHOS  --   --  4.4  --    Estimated Creatinine Clearance: 29.3 mL/min (A) (by C-G formula based on SCr of 2.42 mg/dL (H)). Liver & Pancreas: Recent Labs  Lab 04/15/21 0412 04/16/21 0352  AST 17  --   ALT 9  --   ALKPHOS 42  --   BILITOT 0.7  --   PROT 5.2*  --   ALBUMIN 2.6* 2.7*   No results for input(s): LIPASE, AMYLASE in the last 168 hours. No results for input(s): AMMONIA in the last 168  hours. Diabetic: No results for input(s): HGBA1C in the last 72 hours.  Recent Labs  Lab 04/17/21 1604 04/17/21 2127 04/18/21 0518 04/18/21 0638 04/18/21 1122  GLUCAP 136* 91 146* 138* 163*   Cardiac Enzymes: No results for input(s): CKTOTAL, CKMB, CKMBINDEX, TROPONINI in the last 168 hours. No results for input(s): PROBNP in the last 8760 hours. Coagulation Profile: No results for input(s): INR, PROTIME in the last 168  hours. Thyroid Function Tests: No results for input(s): TSH, T4TOTAL, FREET4, T3FREE, THYROIDAB in the last 72 hours. Lipid Profile: No results for input(s): CHOL, HDL, LDLCALC, TRIG, CHOLHDL, LDLDIRECT in the last 72 hours. Anemia Panel: Recent Labs    04/16/21 0352  VITAMINB12 789  FOLATE 26.3  FERRITIN 400*  TIBC 200*  IRON 46  RETICCTPCT 2.0   Urine analysis:    Component Value Date/Time   COLORURINE YELLOW 03/04/2021 2147   APPEARANCEUR CLOUDY (A) 03/04/2021 2147   LABSPEC 1.025 03/04/2021 2147   PHURINE 5.5 03/04/2021 2147   GLUCOSEU NEGATIVE 03/04/2021 2147   HGBUR NEGATIVE 03/04/2021 2147   BILIRUBINUR NEGATIVE 03/04/2021 2147   KETONESUR NEGATIVE 03/04/2021 2147   PROTEINUR >300 (A) 03/04/2021 2147   NITRITE NEGATIVE 03/04/2021 2147   LEUKOCYTESUR LARGE (A) 03/04/2021 2147   Sepsis Labs: Invalid input(s): PROCALCITONIN, [place]  Microbiology: Recent Results (from the past 240 hour(s))  Resp Panel by RT-PCR (Flu A&B, Covid) Nasopharyngeal Swab     Status: None   Collection Time: 04/14/21  4:34 PM   Specimen: Nasopharyngeal Swab; Nasopharyngeal(NP) swabs in vial transport medium  Result Value Ref Range Status   SARS Coronavirus 2 by RT PCR NEGATIVE NEGATIVE Final    Comment: (NOTE) SARS-CoV-2 target nucleic acids are NOT DETECTED.  The SARS-CoV-2 RNA is generally detectable in upper respiratory specimens during the acute phase of infection. The lowest concentration of SARS-CoV-2 viral copies this assay can detect is 138  copies/mL. A negative result does not preclude SARS-Cov-2 infection and should not be used as the sole basis for treatment or other patient management decisions. A negative result may occur with  improper specimen collection/handling, submission of specimen other than nasopharyngeal swab, presence of viral mutation(s) within the areas targeted by this assay, and inadequate number of viral copies(<138 copies/mL). A negative result must be combined with clinical observations, patient history, and epidemiological information. The expected result is Negative.  Fact Sheet for Patients:  EntrepreneurPulse.com.au  Fact Sheet for Healthcare Providers:  IncredibleEmployment.be  This test is no t yet approved or cleared by the [place] FDA and  has been authorized for detection and/or diagnosis of SARS-CoV-2 by FDA under an Emergency Use Authorization (EUA). This EUA will remain  in effect (meaning this test can be used) for the duration of the COVID-19 declaration under Section 564(b)(1) of the Act, 21 U.S.C.section 360bbb-3(b)(1), unless the authorization is terminated  or revoked sooner.       Influenza A by PCR NEGATIVE NEGATIVE Final   Influenza B by PCR NEGATIVE NEGATIVE Final    Comment: (NOTE) The Xpert Xpress SARS-CoV-2/FLU/RSV plus assay is intended as an aid in the diagnosis of influenza from Nasopharyngeal swab specimens and should not be used as a sole basis for treatment. Nasal washings and aspirates are unacceptable for Xpert Xpress SARS-CoV-2/FLU/RSV testing.  Fact Sheet for Patients: EntrepreneurPulse.com.au  Fact Sheet for Healthcare Providers: IncredibleEmployment.be  This test is not yet approved or cleared by the [place] FDA and has been authorized for detection and/or diagnosis of SARS-CoV-2 by FDA under an Emergency Use Authorization (EUA). This EUA will remain in effect (meaning  this test can be used) for the duration of the COVID-19 declaration under Section 564(b)(1) of the Act, 21 U.S.C. section 360bbb-3(b)(1), unless the authorization is terminated or revoked.  Performed at [facility], [place] [address] [place], [place] [address]     Radiology Studies: No results found.    Eulogio Bear [facility]  If 7PM-7AM, please contact night-coverage [email or web address] 04/18/2021, 1:57 PM

## 2021-04-18 NOTE — Progress Notes (Signed)
[facility] night cross cover note:  I was notified by RN of patient's blood pressure of 210/76.  Patient reports mild headache, but otherwise is reportedly asymptomatic, without acute focal neurologic deficit.  She has scheduled oral antihypertensive medications ordered for later today, and also appears to be undergoing IV diuresis.  As a temporizing measure, I placed order for as needed IV hydralazine.      Babs Bertin, DO Hospitalist

## 2021-04-18 NOTE — Progress Notes (Signed)
Progress Note  Patient Name: Maria Hudson Date of Encounter: 04/18/2021  [facility] Cardiologist: Skeet Latch, MD   Subjective   Had a gout flare with diuresis, now on steroids. Renal function worse today. On room air, feels like she is improving. Blood pressures remained elevated.   Inpatient Medications    Scheduled Meds:  amLODipine  10 mg Oral q morning   aspirin EC  81 mg Oral Daily   atorvastatin  20 mg Oral QPM   buPROPion  100 mg Oral BID   carvedilol  25 mg Oral BID WC   chlorthalidone  25 mg Oral Daily   cloNIDine  0.4 mg Oral TID   doxazosin  8 mg Oral QPM   enoxaparin (LOVENOX) injection  55 mg Subcutaneous QHS   ferrous sulfate  325 mg Oral BID   [START ON 04/19/2021] furosemide  40 mg Intravenous Daily   hydrALAZINE  100 mg Oral TID   insulin aspart  0-20 Units Subcutaneous TID WC   insulin aspart  0-5 Units Subcutaneous QHS   isosorbide mononitrate  120 mg Oral Daily   loratadine  10 mg Oral Daily   omega-3 acid ethyl esters  1 capsule Oral Daily   pantoprazole  40 mg Oral BID AC   predniSONE  20 mg Oral Q breakfast   pregabalin  25 mg Oral BID   senna  2 tablet Oral Daily   sertraline  50 mg Oral Daily   sodium bicarbonate  650 mg Oral BID   sodium chloride flush  3 mL Intravenous Q12H   sorbitol, milk of mag, mineral oil, glycerin (SMOG) enema  960 mL Rectal Once   Continuous Infusions:  sodium chloride     PRN Meds: sodium chloride, acetaminophen, bisacodyl, hydrALAZINE, nitroGLYCERIN, ondansetron **OR** ondansetron (ZOFRAN) IV, sodium chloride flush, sorbitol   Vital Signs    Vitals:   04/18/21 0105 04/18/21 0452 04/18/21 0654 04/18/21 1128  BP: (!) 191/79 (!) 210/76 (!) 180/77 (!) 182/76  Pulse: 62 62  61  Resp: 19 18  18   Temp: 97.8 F (36.6 C) 97.7 F (36.5 C)  98 F (36.7 C)  TempSrc: Oral Oral  Oral  SpO2: 100% 100%  98%  Weight:   106.9 kg   Height:        Intake/Output Summary (Last 24 hours) at 04/18/2021 1253 Last  data filed at 04/18/2021 1029 Gross per 24 hour  Intake 680 ml  Output --  Net 680 ml   Last 3 Weights 04/18/2021 04/17/2021 04/16/2021  Weight (lbs) 235 lb 9.6 oz 238 lb 11.2 oz 241 lb 11.2 oz  Weight (kg) 106.867 kg 108.274 kg 109.634 kg      Telemetry    NSR - Personally Reviewed  ECG    No new since 10/31 - Personally Reviewed  Physical Exam   GEN: Well nourished, well developed in no acute distress NECK: No JVD CARDIAC: regular rhythm, normal S1 and S2, no rubs or gallops. 1/6 systolic murmur. VASCULAR: Radial pulses 2+ bilaterally.  RESPIRATORY:  Clear to auscultation without rales, wheezing or rhonchi  ABDOMEN: Soft, non-tender, non-distended MUSCULOSKELETAL:  Moves all 4 limbs independently SKIN: Warm and dry, 1-2+ bilateral LE edema NEUROLOGIC:  No focal neuro deficits noted. PSYCHIATRIC:  Normal affect     Labs    High Sensitivity Troponin:  No results for input(s): TROPONINIHS in the last 720 hours.   Chemistry Recent Labs  Lab 04/15/21 0412 04/16/21 0352 04/18/21 0350  NA 138  137 139  K 3.3* 3.8 3.8  CL 111 108 107  CO2 21* 24 24  GLUCOSE 141* 142* 138*  BUN 39* 40* 43*  CREATININE 1.83* 2.05* 2.42*  CALCIUM 8.6* 8.4* 8.6*  MG  --  2.2  --   PROT 5.2*  --   --   ALBUMIN 2.6* 2.7*  --   AST 17  --   --   ALT 9  --   --   ALKPHOS 42  --   --   BILITOT 0.7  --   --   GFRNONAA 31* 27* 22*  ANIONGAP 6 5 8     Lipids No results for input(s): CHOL, TRIG, HDL, LABVLDL, LDLCALC, CHOLHDL in the last 168 hours.  Hematology Recent Labs  Lab 04/14/21 1219 04/15/21 0412 04/16/21 0352  WBC 7.7 7.6 5.9  RBC 3.69* 3.78* 3.83*  3.78*  HGB 10.2* 10.1* 10.0*  HCT 30.7* 30.6* 31.5*  MCV 83.2 81.0 82.2  MCH 27.6 26.7 26.1  MCHC 33.2 33.0 31.7  RDW 18.6* 18.4* 18.5*  PLT 270 261 276   Thyroid No results for input(s): TSH, FREET4 in the last 168 hours.  BNP Recent Labs  Lab 04/14/21 1219  BNP 97.1    DDimer No results for input(s): DDIMER in the last  168 hours.   Radiology    No results found.  Cardiac Studies   Echo 03/06/21  1. Left ventricular ejection fraction, by estimation, is 55 to 60%. The  left ventricle has normal function. The left ventricle has no regional  wall motion abnormalities. There is mild left ventricular hypertrophy.  Left ventricular diastolic parameters  are consistent with Grade I diastolic dysfunction (impaired relaxation).  Elevated left atrial pressure.   2. Right ventricular systolic function is normal. The right ventricular  size is normal.   3. Left atrial size was mildly dilated.   4. The mitral valve is normal in structure. Trivial mitral valve  regurgitation. No evidence of mitral stenosis.   5. The aortic valve is tricuspid. Aortic valve regurgitation is not  visualized. No aortic stenosis is present.   6. The inferior vena cava is normal in size with greater than 50%  respiratory variability, suggesting right atrial pressure of 3 mmHg.  Patient Profile     60 y.o. female with a hx of CAD, chronic diastolic heart failure, type II diabetes, chronic kidney disease stage 4, hypertension, history of stroke who is being seen in consultation for the evaluation of acute decompensated diastolic heart failure at the request of Dr. Darrick Meigs.  Assessment & Plan    Acute on chronic diastolic heart failure Chronic kidney disease, stage 4 Hypoalbuminemia -albumin 2.6-2.7, likely contributing to third spacing -I/O inaccurate, weight 110.9 kg on admission, weight today 106.9 kg -BNP 97, suspect falsely low due to obesity -GFR has typically been <30, so no SGLT2i started. -Cr rising today. Discussed with Dr. Eliseo Squires. With limited options and rising Cr, will ask nephrology if they have additional recommendations for her   Labile vs uncontrolled hypertension -followed closely by Dr. Oval Linsey and nephrology team as an outpatient. -at home, on carvedilol 25 mg BID, hydralazine 100 mg TID, amlodipine 10 mg daily,  doxazosin 2 mg PM, imdur 60 mg daily -increased doxazosin to 8 mg in the evening -increased imdur to 120 mg daily without improvement -renin:angiotensin workup unrevealing, as were metanephrines. -had elevated Cr on losartan. If function/potassium allows, could try MRA, but may not be able to use given her CKD -  minoxidil can cause fluid retention, will not start until her volume status is improved. Once started, will need to titrate slowly, monitor for orthostatic hypotension to determine optimum dosing. Need to monitor for angina, ECG changes, and be mindful of risk for pericardial effusion-->with recent start of steroids for gout, fluid retention is significant risk. Will hold on adding this today   Type II diabetes Obesity -A1c 6.7 -see above re: SGLT2i -on insulin chronically -weight loss would likely help her blood pressure. We discussed GLP1RA therapy as an outpatient. She is very interested in this.   For questions or updates, please contact [place] Please consult [email or web address] for contact info under     Signed, Buford Dresser, MD  04/18/2021, 12:53 PM

## 2021-04-18 NOTE — Consult Note (Signed)
Nephrology Consult   Requesting provider: Eulogio Bear, DO Service requesting consult: Internal Medicine Reason for consult: Resistant HTN   Assessment/Recommendations: Maria Hudson is a/an 60 y.o. female with a past medical history of diastolic CHF, Type 2 DM, CKD Stage IV, CVA, HTN, morbid obesity who present w/shortness of breath   Resistant Hypertension: BP persistently elevated with most recent measurement 182/76. BP at home measurements are typically 180s/70s. Currently on Hydralazind 100mg  TID, Imdur 120mg , doxazsin 8mg , amlodipine 10mg , clonidine 0.4mg  TID. Resistant despite multiple medications, no known evaluation for renal stenosis completed previously.  - Starting Chlorthalidone 25mg  daily - Continue diuresis with Lasix 40mg  - Renal artery [place] to assess for stenosis.  Non-Oliguric AKI on CKD Stage IV: Likely secondary to acute CHF exacerbation with volume overload and uncontrolled HTN. Creatinine 2.42 with GFR 22. Currently on Lasix 40mg  IV daily, continue diuresis and creatinine monitoring. Creatinine did bump, was admitted with Cr of 1.96 (though Cr in sept averaged around 2.8, baseline from earlier this year appeared to be around 2.1). - Chart reviewed: (medications acceptable, does not appear to have been exposed to nephrotoxins with imaging or had episodes of significant hypotension).   - Continue to monitor daily Cr, Dose meds for GFR - Monitor Daily I/Os, Daily weight  - Maintain MAP>65 for optimal renal perfusion.  - Avoid nephrotoxic medications including NSAIDs and Vanc/Zosyn combo - Check Renal vascular study to evaluate for renal artery stenosis - Currently no indication for HD - Continue diuresis with IV Lasix 40mg   Acute on chronic diastolic CHF: Likely secondary to uncontrolled HTN. Currently on IV Lasix and down 2.5L in the last 24h and lost 3kg of weight since admission. Cardiology following. - Continue Diuresis with IV Lasix.  Volume Status: Appears volume  overloaded on exam. Based on our examination and review of available imaging, our recommendation is continued diuresis.  Anemia due to CKD: Hgb 10, stable. - Transfuse for Hgb<7 g/dL - No role for ESA in this setting  Controlled Diabetes Mellitus Type 2 with Hyperglycemia: A1c 6.7. Currently on insulin and statin per primary team.  History of CAD: on Coreg, ASA, and Statin   Recommendations conveyed to primary service.    [facility] 04/18/2021 11:13 AM   _____________________________________________________________________________________ CC: Shortness of breath  History of Present Illness: Maria Hudson is a/an 60 y.o. female with a past medical history of diastolic CHF, Type 2 DM, CKD Stage IV, CVA, HTN, morbid obesity who present w/shortness of breath.   Patient reports that she was initially having shortness of breath and leg swelling and went to see her cardiologist who sent her to the hospital. She is overall doing better since her admission after getting some of the fluid off. She still has baseline shortness of breath that is improved and has leg swelling, but it is improved. She also has baseline chest pain that is unchanged.    Medications:  Current Facility-Administered Medications  Medication Dose Route Frequency Provider Last Rate Last Admin   0.9 %  sodium chloride infusion  250 mL Intravenous PRN Oswald Hillock, MD       acetaminophen (TYLENOL) tablet 650 mg  650 mg Oral Q6H PRN Etta Quill, DO   650 mg at 04/18/21 0504   amLODipine (NORVASC) tablet 10 mg  10 mg Oral q morning Oswald Hillock, MD   10 mg at 04/18/21 1006   aspirin EC tablet 81 mg  81 mg Oral Daily Oswald Hillock, MD  81 mg at 04/18/21 1006   atorvastatin (LIPITOR) tablet 20 mg  20 mg Oral QPM Darrick Meigs, Gagan S, MD   20 mg at 04/17/21 1724   bisacodyl (DULCOLAX) suppository 10 mg  10 mg Rectal Daily PRN [name], DO       buPROPion [facility]) tablet 100 mg  100  mg Oral BID Darrick Meigs, Gagan S, MD   100 mg at 04/18/21 0505   carvedilol (COREG) tablet 25 mg  25 mg Oral BID WC Etta Quill, DO   25 mg at 04/18/21 0805   chlorthalidone (HYGROTON) tablet 25 mg  25 mg Oral Daily Reesa Chew, MD       cloNIDine (CATAPRES) tablet 0.4 mg  0.4 mg Oral TID Etta Quill, DO   0.4 mg at 04/18/21 1005   doxazosin (CARDURA) tablet 8 mg  8 mg Oral QPM Buford Dresser, MD   8 mg at 04/17/21 1723   enoxaparin (LOVENOX) injection 55 mg  55 mg Subcutaneous QHS Oswald Hillock, MD   55 mg at 04/17/21 2125   ferrous sulfate tablet 325 mg  325 mg Oral BID Oswald Hillock, MD   325 mg at 04/18/21 1012   [START ON 04/19/2021] furosemide (LASIX) injection 40 mg  40 mg Intravenous Daily Reesa Chew, MD       hydrALAZINE (APRESOLINE) injection 10 mg  10 mg Intravenous Q4H PRN Howerter, Justin B, DO   10 mg at 04/18/21 9233   hydrALAZINE (APRESOLINE) tablet 100 mg  100 mg Oral TID Etta Quill, DO   100 mg at 04/18/21 1005   insulin aspart (novoLOG) injection 0-20 Units  0-20 Units Subcutaneous TID WC Oswald Hillock, MD   3 Units at 04/18/21 0525   insulin aspart (novoLOG) injection 0-5 Units  0-5 Units Subcutaneous QHS Oswald Hillock, MD       isosorbide mononitrate (IMDUR) 24 hr tablet 120 mg  120 mg Oral Daily Buford Dresser, MD   120 mg at 04/18/21 1009   loratadine (CLARITIN) tablet 10 mg  10 mg Oral Daily Oswald Hillock, MD   10 mg at 04/18/21 1005   nitroGLYCERIN (NITROSTAT) SL tablet 0.4 mg  0.4 mg Sublingual Q5 min PRN Oswald Hillock, MD       omega-3 acid ethyl esters (LOVAZA) capsule 1 g  1 capsule Oral Daily Oswald Hillock, MD   1 g at 04/18/21 1005   ondansetron (ZOFRAN) tablet 4 mg  4 mg Oral Q6H PRN Oswald Hillock, MD       Or   ondansetron (ZOFRAN) injection 4 mg  4 mg Intravenous Q6H PRN Oswald Hillock, MD       pantoprazole (PROTONIX) EC tablet 40 mg  40 mg Oral BID AC Vann, Jessica U, DO   40 mg at 04/18/21 0076   predniSONE (DELTASONE)  tablet 20 mg  20 mg Oral Q breakfast Eulogio Bear U, DO   20 mg at 04/18/21 0805   pregabalin (LYRICA) capsule 25 mg  25 mg Oral BID Oswald Hillock, MD   25 mg at 04/18/21 1005   senna (SENOKOT) tablet 17.2 mg  2 tablet Oral Daily Eulogio Bear U, DO   17.2 mg at 04/18/21 1005   sertraline (ZOLOFT) tablet 50 mg  50 mg Oral Daily Oswald Hillock, MD   50 mg at 04/18/21 1006   sodium bicarbonate tablet 650 mg  650 mg Oral BID [name],  DO   650 mg at 04/18/21 1005   sodium chloride flush (NS) 0.9 % injection 3 mL  3 mL Intravenous Q12H Oswald Hillock, MD   3 mL at 04/18/21 1004   sodium chloride flush (NS) 0.9 % injection 3 mL  3 mL Intravenous PRN Oswald Hillock, MD       sorbitol 70 % solution 30 mL  30 mL Oral Daily PRN Vann, Jessica U, DO       sorbitol, milk of mag, mineral oil, glycerin (SMOG) enema  960 mL Rectal Once [name], DO         ALLERGIES Hydrocodone and Oxycodone-acetaminophen  MEDICAL HISTORY Past Medical History:  Diagnosis Date   Acute on chronic diastolic heart failure ([place]) 03/05/2021   CHF (congestive heart failure) (HCC)    CKD (chronic kidney disease) stage 4, GFR 15-29 ml/min ([place]) 03/05/2021   Diabetes mellitus without complication (HCC)    Hypertension    Hypertensive urgency 04/14/2021   Renal disorder    Stroke [facility])      SOCIAL HISTORY Social History   Socioeconomic History   Marital status: Divorced    Spouse name: Not on file   Number of children: 2   Years of education: Not on file   Highest education level: Some college, no degree  Occupational History   Occupation: retired  Tobacco Use   Smoking status: Never    Passive exposure: Never   Smokeless tobacco: Never  Vaping Use   Vaping Use: Never used  Substance and Sexual Activity   Alcohol use: Not Currently   Drug use: Not Currently   Sexual activity: Not on file  Other Topics Concern   Not on file  Social History Narrative   Not on file   Social Determinants of Health    Financial Resource Strain: Low Risk    Difficulty of Paying Living Expenses: Not very hard  Food Insecurity: No Food Insecurity   Worried About Charity fundraiser in the Last Year: Never true   Ran Out of Food in the Last Year: Never true  Transportation Needs: No Transportation Needs   Lack of Transportation (Medical): No   Lack of Transportation (Non-Medical): No  Physical Activity: Not on file  Stress: Not on file  Social Connections: Not on file  Intimate Partner Violence: Not on file     FAMILY HISTORY Family History  Problem Relation Age of Onset   Hypertension Mother    Stroke Mother    Hypertension Father    Hypertension Sister    Hypertension Son    Hypertension Daughter       Review of Systems: 67 systems reviewed Otherwise as per HPI, all other systems reviewed and negative  Physical Exam: Vitals:   04/18/21 0452 04/18/21 0654  BP: (!) 210/76 (!) 180/77  Pulse: 62   Resp: 18   Temp: 97.7 F (36.5 C)   SpO2: 100%    Total I/O In: 220 [P.O.:220] Out: -   Intake/Output Summary (Last 24 hours) at 04/18/2021 1113 Last data filed at 04/18/2021 1029 Gross per 24 hour  Intake 680 ml  Output 800 ml  Net -120 ml   General: well-appearing, no acute distress HEENT: anicteric sclera, oropharynx clear without lesions CV: regular rate, normal rhythm, 1/6 systolic murmur, no gallops, no rubs, 2+ peripheral edema in BLE Lungs: clear to auscultation bilaterally, normal work of breathing on room air Abd: soft, mildly tender to palpation in epigastric region, non-distended  Skin: no visible lesions or rashes Psych: alert, engaged, appropriate mood and affect Musculoskeletal: no obvious deformities Neuro: normal speech, no gross focal deficits   Test Results Reviewed Lab Results  Component Value Date   NA 139 04/18/2021   K 3.8 04/18/2021   CL 107 04/18/2021   CO2 24 04/18/2021   BUN 43 (H) 04/18/2021   CREATININE 2.42 (H) 04/18/2021   CALCIUM 8.6 (L)  04/18/2021   ALBUMIN 2.7 (L) 04/16/2021   PHOS 4.4 04/16/2021     I have reviewed all relevant outside healthcare records related to the patient's current hospitalization

## 2021-04-19 ENCOUNTER — Inpatient Hospital Stay (HOSPITAL_COMMUNITY): Payer: Medicaid Other

## 2021-04-19 DIAGNOSIS — I5033 Acute on chronic diastolic (congestive) heart failure: Secondary | ICD-10-CM | POA: Diagnosis not present

## 2021-04-19 DIAGNOSIS — I10 Essential (primary) hypertension: Secondary | ICD-10-CM | POA: Diagnosis not present

## 2021-04-19 DIAGNOSIS — N1832 Chronic kidney disease, stage 3b: Secondary | ICD-10-CM | POA: Diagnosis not present

## 2021-04-19 DIAGNOSIS — I130 Hypertensive heart and chronic kidney disease with heart failure and stage 1 through stage 4 chronic kidney disease, or unspecified chronic kidney disease: Secondary | ICD-10-CM | POA: Diagnosis not present

## 2021-04-19 DIAGNOSIS — E1129 Type 2 diabetes mellitus with other diabetic kidney complication: Secondary | ICD-10-CM | POA: Diagnosis not present

## 2021-04-19 DIAGNOSIS — N184 Chronic kidney disease, stage 4 (severe): Secondary | ICD-10-CM | POA: Diagnosis not present

## 2021-04-19 DIAGNOSIS — E1122 Type 2 diabetes mellitus with diabetic chronic kidney disease: Secondary | ICD-10-CM | POA: Diagnosis not present

## 2021-04-19 DIAGNOSIS — D631 Anemia in chronic kidney disease: Secondary | ICD-10-CM | POA: Diagnosis not present

## 2021-04-19 DIAGNOSIS — I509 Heart failure, unspecified: Secondary | ICD-10-CM | POA: Diagnosis not present

## 2021-04-19 DIAGNOSIS — E8779 Other fluid overload: Secondary | ICD-10-CM | POA: Diagnosis not present

## 2021-04-19 DIAGNOSIS — I5030 Unspecified diastolic (congestive) heart failure: Secondary | ICD-10-CM | POA: Diagnosis not present

## 2021-04-19 DIAGNOSIS — I159 Secondary hypertension, unspecified: Secondary | ICD-10-CM | POA: Diagnosis not present

## 2021-04-19 DIAGNOSIS — M109 Gout, unspecified: Secondary | ICD-10-CM | POA: Diagnosis not present

## 2021-04-19 DIAGNOSIS — N179 Acute kidney failure, unspecified: Secondary | ICD-10-CM | POA: Diagnosis not present

## 2021-04-19 DIAGNOSIS — Z794 Long term (current) use of insulin: Secondary | ICD-10-CM | POA: Diagnosis not present

## 2021-04-19 LAB — GLUCOSE, CAPILLARY
Glucose-Capillary: 138 mg/dL — ABNORMAL HIGH (ref 70–99)
Glucose-Capillary: 184 mg/dL — ABNORMAL HIGH (ref 70–99)
Glucose-Capillary: 237 mg/dL — ABNORMAL HIGH (ref 70–99)
Glucose-Capillary: 236 mg/dL — ABNORMAL HIGH (ref 70–99)

## 2021-04-19 LAB — BASIC METABOLIC PANEL
Anion gap: 6 (ref 5–15)
BUN: 46 mg/dL — ABNORMAL HIGH (ref 6–20)
CO2: 24 mmol/L (ref 22–32)
Calcium: 8.4 mg/dL — ABNORMAL LOW (ref 8.9–10.3)
Chloride: 105 mmol/L (ref 98–111)
Creatinine, Ser: 2.31 mg/dL — ABNORMAL HIGH (ref 0.44–1.00)
GFR, Estimated: 24 mL/min — ABNORMAL LOW (ref >60)
Glucose, Bld: 167 mg/dL — ABNORMAL HIGH (ref 70–99)
Potassium: 3.7 mmol/L (ref 3.5–5.1)
Sodium: 135 mmol/L (ref 135–145)

## 2021-04-19 MED ORDER — SPIRONOLACTONE 25 MG PO TABS
25.0000 mg | ORAL_TABLET | Freq: Every day | ORAL | Status: DC
  Administered 2021-04-19: 25 mg via ORAL
  Filled 2021-04-19: qty 1

## 2021-04-19 MED ORDER — POTASSIUM CHLORIDE CRYS ER 20 MEQ PO TBCR
40.0000 meq | EXTENDED_RELEASE_TABLET | Freq: Once | ORAL | Status: AC
  Administered 2021-04-19: 40 meq via ORAL
  Filled 2021-04-19: qty 2

## 2021-04-19 MED ORDER — TORSEMIDE 20 MG PO TABS
40.0000 mg | ORAL_TABLET | Freq: Every day | ORAL | Status: DC
  Administered 2021-04-19 – 2021-04-20 (×2): 40 mg via ORAL
  Filled 2021-04-19 (×2): qty 2

## 2021-04-19 MED ORDER — SORBITOL 70 % SOLN
30.0000 mL | Freq: Every day | Status: DC
  Administered 2021-04-19 – 2021-04-20 (×2): 30 mL via ORAL
  Filled 2021-04-19 (×2): qty 30

## 2021-04-19 NOTE — Progress Notes (Signed)
PROGRESS NOTE  Maria Hudson BPZ:[id number] DOB: 1960-06-22   PCP: Nolene Ebbs, MD  Patient is from: Home.  Independent at baseline.  DOA: 04/14/2021 LOS: 5  Chief complaints:  Chief Complaint  Patient presents with   Shortness of Breath     Brief Narrative / Interim history: 60 year old F with PMH of diastolic CHF, DM-2, CKD-4, CVA, HTN and morbid obesity sent to ED from cardiology office by Dr. Oval Linsey for hypertensive urgency, 18 pounds weight gain and dyspnea, and admitted for acute on chronic diastolic CHF and hypertensive urgency.  Started on IV Lasix. Cardiology following. Nephrology consulted for refractory HTN  Subjective: Thinks she can go home tomm  Objective: Vitals:   04/19/21 0439 04/19/21 0544 04/19/21 0952 04/19/21 1054  BP: (!) 198/76 (!) 179/67 (!) 181/73 (!) 173/70  Pulse: (!) 58 (!) 57 60 62  Resp: 20   19  Temp: 97.6 F (36.4 C)   98.6 F (37 C)  TempSrc: Oral   Oral  SpO2: 100%   94%  Weight: 106.9 kg     Height:        Intake/Output Summary (Last 24 hours) at 04/19/2021 1420 Last data filed at 04/19/2021 1335 Gross per 24 hour  Intake 700 ml  Output 1950 ml  Net -1250 ml   Filed Weights   04/17/21 0336 04/18/21 0654 04/19/21 0439  Weight: 108.3 kg 106.9 kg 106.9 kg    Examination:   General: Appearance:    Severely obese female in no acute distress     Lungs:     respirations unlabored  Heart:    Normal heart rate.   MS:   All extremities are intact.    Neurologic:   Awake, alert, oriented x 3. No apparent focal neurological           defect.          Microbiology summarized: COVID-19 and influenza PCR nonreactive.  Assessment & Plan: Acute on chronic diastolic CHF: TTE in 07/4233 with LVEF of 55 to 60%, G1-DD.  Progressive dyspnea and edema despite Bumex and metolazone at home.  Reportedly about 18 pounds weight gain as well.  CXR with interstitial edema and mild cardiomegaly.  BNP 97 but could be falsely low given morbid  obesity.  Likely due to uncontrolled hypertension.  Started on IV Lasix.  Reports improvement in his symptoms but still with significant fluid overload.  I&O was not captured well. -Cardiology following -Seems she was referred for sleep study by cardiology already- CPAP added QHS -Sodium and fluid restriction  Uncontrolled hypertension/resistant hypertension: BP remains elevated but improved. -Continue Coreg, Cardura, Catapres, Imdur, hydralazine, amlodipine and Lasix- per cards -Needs outpatient sleep study. -nephrology consult for refractory HTN appreciated: goal: 361-443 systolic prior to discharge  CKD-4/azotemia:  -Continue monitoring  Controlled DM-2 with CKD-4/hyperglycemia: A1c 6.7%. -Continue current insulin regimen and statin  Anemia of renal disease: Slight drop in Hgb from baseline but stable here.  History of CAD: Denies chest pain or anginal symptoms. -Continue Coreg, aspirin and a statin.  At risk for sleep apnea -Needs sleep study. -had bipap as an outpatient but has not worked in months -BIPAP while here  Depression: Stable. -Continue home Zoloft and Wellbutrin.  Hypokalemia: K3.3. -replete   Morbid obesity Body mass index is 41.75 kg/m.  -Encourage lifestyle change to lose weight.   ? Gout -uric acid elevated -prednisone as Cr up x 3 days   Constipation -bowel regimen    DVT prophylaxis:  On subcu  Lovenox.  Code Status: DNR/DNI Level of care: Telemetry Cardiac Status is: Inpatient  Remains inpatient appropriate because: Acute on chronic diastolic CHF and uncontrolled hypertension requiring IV diuretics       Consultants:  Cardiology nephrology   Sch Meds:  Scheduled Meds:  amLODipine  10 mg Oral q morning   aspirin EC  81 mg Oral Daily   atorvastatin  20 mg Oral QPM   buPROPion  100 mg Oral BID   carvedilol  25 mg Oral BID WC   chlorthalidone  25 mg Oral Daily   cloNIDine  0.4 mg Oral TID   doxazosin  8 mg Oral QPM    enoxaparin (LOVENOX) injection  55 mg Subcutaneous QHS   ferrous sulfate  325 mg Oral BID   hydrALAZINE  100 mg Oral TID   insulin aspart  0-20 Units Subcutaneous TID WC   insulin aspart  0-5 Units Subcutaneous QHS   isosorbide mononitrate  120 mg Oral Daily   loratadine  10 mg Oral Daily   omega-3 acid ethyl esters  1 capsule Oral Daily   pantoprazole  40 mg Oral BID AC   pregabalin  25 mg Oral BID   senna  2 tablet Oral Daily   sertraline  50 mg Oral Daily   sodium chloride flush  3 mL Intravenous Q12H   sorbitol  30 mL Oral Daily   sorbitol, milk of mag, mineral oil, glycerin (SMOG) enema  960 mL Rectal Once   spironolactone  25 mg Oral Daily   torsemide  40 mg Oral Daily   Continuous Infusions:  sodium chloride     PRN Meds:.sodium chloride, acetaminophen, bisacodyl, hydrALAZINE, nitroGLYCERIN, ondansetron **OR** ondansetron (ZOFRAN) IV, sodium chloride flush  Antimicrobials: Anti-infectives (From admission, onward)    None        I have personally reviewed the following labs and images: CBC: Recent Labs  Lab 04/14/21 1219 04/15/21 0412 04/16/21 0352  WBC 7.7 7.6 5.9  HGB 10.2* 10.1* 10.0*  HCT 30.7* 30.6* 31.5*  MCV 83.2 81.0 82.2  PLT 270 261 276   BMP &GFR Recent Labs  Lab 04/14/21 1219 04/15/21 0412 04/16/21 0352 04/18/21 0350 04/19/21 0527  NA 140 138 137 139 135  K 4.1 3.3* 3.8 3.8 3.7  CL 110 111 108 107 105  CO2 23 21* 24 24 24   GLUCOSE 93 141* 142* 138* 167*  BUN 45* 39* 40* 43* 46*  CREATININE 1.96* 1.83* 2.05* 2.42* 2.31*  CALCIUM 8.2* 8.6* 8.4* 8.6* 8.4*  MG  --   --  2.2  --   --   PHOS  --   --  4.4  --   --    Estimated Creatinine Clearance: 30.7 mL/min (A) (by C-G formula based on SCr of 2.31 mg/dL (H)). Liver & Pancreas: Recent Labs  Lab 04/15/21 0412 04/16/21 0352  AST 17  --   ALT 9  --   ALKPHOS 42  --   BILITOT 0.7  --   PROT 5.2*  --   ALBUMIN 2.6* 2.7*   No results for input(s): LIPASE, AMYLASE in the last 168  hours. No results for input(s): AMMONIA in the last 168 hours. Diabetic: No results for input(s): HGBA1C in the last 72 hours.  Recent Labs  Lab 04/18/21 1122 04/18/21 1608 04/18/21 2112 04/19/21 0646 04/19/21 1118  GLUCAP 163* 261* 179* 138* 184*   Cardiac Enzymes: No results for input(s): CKTOTAL, CKMB, CKMBINDEX, TROPONINI in the last 168 hours. No  results for input(s): PROBNP in the last 8760 hours. Coagulation Profile: No results for input(s): INR, PROTIME in the last 168 hours. Thyroid Function Tests: No results for input(s): TSH, T4TOTAL, FREET4, T3FREE, THYROIDAB in the last 72 hours. Lipid Profile: No results for input(s): CHOL, HDL, LDLCALC, TRIG, CHOLHDL, LDLDIRECT in the last 72 hours. Anemia Panel: No results for input(s): VITAMINB12, FOLATE, FERRITIN, TIBC, IRON, RETICCTPCT in the last 72 hours.  Urine analysis:    Component Value Date/Time   COLORURINE YELLOW 03/04/2021 2147   APPEARANCEUR CLOUDY (A) 03/04/2021 2147   LABSPEC 1.025 03/04/2021 2147   PHURINE 5.5 03/04/2021 2147   GLUCOSEU NEGATIVE 03/04/2021 2147   HGBUR NEGATIVE 03/04/2021 2147   BILIRUBINUR NEGATIVE 03/04/2021 2147   KETONESUR NEGATIVE 03/04/2021 2147   PROTEINUR >300 (A) 03/04/2021 2147   NITRITE NEGATIVE 03/04/2021 2147   LEUKOCYTESUR LARGE (A) 03/04/2021 2147   Sepsis Labs: Invalid input(s): PROCALCITONIN, [place]  Microbiology: Recent Results (from the past 240 hour(s))  Resp Panel by RT-PCR (Flu A&B, Covid) Nasopharyngeal Swab     Status: None   Collection Time: 04/14/21  4:34 PM   Specimen: Nasopharyngeal Swab; Nasopharyngeal(NP) swabs in vial transport medium  Result Value Ref Range Status   SARS Coronavirus 2 by RT PCR NEGATIVE NEGATIVE Final    Comment: (NOTE) SARS-CoV-2 target nucleic acids are NOT DETECTED.  The SARS-CoV-2 RNA is generally detectable in upper respiratory specimens during the acute phase of infection. The lowest concentration of SARS-CoV-2 viral  copies this assay can detect is 138 copies/mL. A negative result does not preclude SARS-Cov-2 infection and should not be used as the sole basis for treatment or other patient management decisions. A negative result may occur with  improper specimen collection/handling, submission of specimen other than nasopharyngeal swab, presence of viral mutation(s) within the areas targeted by this assay, and inadequate number of viral copies(<138 copies/mL). A negative result must be combined with clinical observations, patient history, and epidemiological information. The expected result is Negative.  Fact Sheet for Patients:  EntrepreneurPulse.com.au  Fact Sheet for Healthcare Providers:  IncredibleEmployment.be  This test is no t yet approved or cleared by the [place] FDA and  has been authorized for detection and/or diagnosis of SARS-CoV-2 by FDA under an Emergency Use Authorization (EUA). This EUA will remain  in effect (meaning this test can be used) for the duration of the COVID-19 declaration under Section 564(b)(1) of the Act, 21 U.S.C.section 360bbb-3(b)(1), unless the authorization is terminated  or revoked sooner.       Influenza A by PCR NEGATIVE NEGATIVE Final   Influenza B by PCR NEGATIVE NEGATIVE Final    Comment: (NOTE) The Xpert Xpress SARS-CoV-2/FLU/RSV plus assay is intended as an aid in the diagnosis of influenza from Nasopharyngeal swab specimens and should not be used as a sole basis for treatment. Nasal washings and aspirates are unacceptable for Xpert Xpress SARS-CoV-2/FLU/RSV testing.  Fact Sheet for Patients: EntrepreneurPulse.com.au  Fact Sheet for Healthcare Providers: IncredibleEmployment.be  This test is not yet approved or cleared by the [place] FDA and has been authorized for detection and/or diagnosis of SARS-CoV-2 by FDA under an Emergency Use Authorization (EUA). This  EUA will remain in effect (meaning this test can be used) for the duration of the COVID-19 declaration under Section 564(b)(1) of the Act, 21 U.S.C. section 360bbb-3(b)(1), unless the authorization is terminated or revoked.  Performed at [facility], [place] [address] [place], [place] [address]     Radiology Studies:  VAS US RENAL ARTERY DUPLEX  Result Date: 04/19/2021 ABDOMINAL VISCERAL Patient Name:  TAELYNN MCELHANNON  Date of Exam:   04/19/2021 Medical Rec #: [id number]       Accession #:    [id number] Date of Birth: 02-05-1961       Patient Gender: F Patient Age:   38 years Exam Location:  [facility] Procedure:      VAS US RENAL ARTERY DUPLEX Referring Phys: [id number] Shaune Pollack PEEPLES -------------------------------------------------------------------------------- High Risk Factors: Hypertension, Diabetes, no history of smoking, prior MI,                    coronary artery disease, prior CVA. Other Factors: CKD4, CHF. Limitations: Obesity and air/bowel gas. Comparison Study: No previous exams Performing Technologist: Jody Hill RVT, RDMS  Examination Guidelines: A complete evaluation includes B-mode imaging, spectral Doppler, color Doppler, and power Doppler as needed of all accessible portions of each vessel. Bilateral testing is considered an integral part of a complete examination. Limited examinations for reoccurring indications may be performed as noted.  Duplex Findings: +--------------------+--------+--------+------+--------+ Mesenteric          PSV cm/sEDV cm/sPlaqueComments +--------------------+--------+--------+------+--------+ Aorta Prox             91      18                  +--------------------+--------+--------+------+--------+ Celiac Artery Origin   98      15                  +--------------------+--------+--------+------+--------+ SMA Origin            145      0                   +--------------------+--------+--------+------+--------+     +------------------+--------+--------+-------+ Right Renal ArteryPSV cm/sEDV cm/sComment +------------------+--------+--------+-------+ Origin              103      18           +------------------+--------+--------+-------+ Proximal             82      13           +------------------+--------+--------+-------+ Mid                  67      12           +------------------+--------+--------+-------+ Distal               65      14           +------------------+--------+--------+-------+ +------------+--------+--------+----+-----------+--------+--------+----+ Right KidneyPSV cm/sEDV cm/sRI  Left KidneyPSV cm/sEDV cm/sRI   +------------+--------+--------+----+-----------+--------+--------+----+ Upper Pole  21      4       0.80Upper Pole 21      3       0.85 +------------+--------+--------+----+-----------+--------+--------+----+ Mid         21      2       0.90Mid        26      4       0.83 +------------+--------+--------+----+-----------+--------+--------+----+ Lower Pole  19      2       0.89Lower Pole 24      4       0.81 +------------+--------+--------+----+-----------+--------+--------+----+ Hilar       19      0       1.00Hilar  25      5       0.80 +------------+--------+--------+----+-----------+--------+--------+----+ +------------------+-----+------------------+-----+ Right Kidney           Left Kidney             +------------------+-----+------------------+-----+ RAR                    RAR                     +------------------+-----+------------------+-----+ RAR (manual)      1.13 RAR (manual)            +------------------+-----+------------------+-----+ Cortex            0.77 Cortex            0.79  +------------------+-----+------------------+-----+ Cortex thickness       Corex thickness         +------------------+-----+------------------+-----+ Kidney length (cm)11.70Kidney length (cm)12.35  +------------------+-----+------------------+-----+  Summary: Renal:  Right: Normal size right kidney. Abnormal right Resistive Index. No        evidence of right renal artery stenosis. RRV flow present. Left:  Normal size of left kidney. Abnormal left Resisitve Index.        LRV flow present. Unable to visualize left renal artery due        to bowel gas. Mesenteric:  No evidence of stenosis in celiac artery and sma, however flow is high resistive.  *See table(s) above for measurements and observations.  Diagnosing physician: Monica Martinez MD  Electronically signed by Monica Martinez MD on 04/19/2021 at 1:08:26 PM.    Final       Eulogio Bear [facility]  If 7PM-7AM, please contact night-coverage [email or web address] 04/19/2021, 2:20 PM

## 2021-04-19 NOTE — Progress Notes (Signed)
Mobility Specialist Progress Note:   04/19/21 1320  Mobility  Activity Ambulated in hall  Level of Assistance Standby assist, set-up cues, supervision of patient - no hands on  Distance Ambulated (ft) 750 ft  Mobility Ambulated with assistance in hallway  Mobility Response Tolerated well  Mobility performed by Mobility specialist  Bed Position Chair  $Mobility charge 1 Mobility   Pt received in chair willing to participate in mobility. No complaints of pain and asymptomatic. Pt returned to chair with call bell in reach and all needs met.   [facility] Health and safety inspector Phone [phone]

## 2021-04-19 NOTE — Progress Notes (Signed)
Renal artery duplex has been completed.   Results can be found under chart review under CV PROC. 04/19/2021 9:41 AM Trevon Strothers RVT, RDMS

## 2021-04-19 NOTE — Progress Notes (Signed)
Pt places herself om CPAP at night.  Vitals are stable and pt not in distress. RT will monitor as needed.

## 2021-04-19 NOTE — Plan of Care (Signed)
  Problem: Activity: Goal: Capacity to carry out activities will improve Outcome: Completed/Met   Problem: Skin Integrity: Goal: Risk for impaired skin integrity will decrease Outcome: Completed/Met   Problem: Safety: Goal: Ability to remain free from injury will improve Outcome: Completed/Met   Problem: Pain Managment: Goal: General experience of comfort will improve Outcome: Completed/Met   Problem: Elimination: Goal: Will not experience complications related to urinary retention Outcome: Completed/Met   Problem: Elimination: Goal: Will not experience complications related to bowel motility Outcome: Completed/Met   Problem: Coping: Goal: Level of anxiety will decrease Outcome: Completed/Met   Problem: Nutrition: Goal: Adequate nutrition will be maintained Outcome: Completed/Met

## 2021-04-19 NOTE — Progress Notes (Signed)
Nephrology Follow-Up Consult note   Assessment/Recommendations: Maria Hudson is a/an 60 y.o. female with a past medical history significant for diastolic CHF, Type 2 DM, CKD Stage IV, CVA, HTN, morbid obesity , admitted for CHF exacerbation and severe HTN.     Resistant HTN: slightly improved today. She runs high at home and pure optimization should take place over time some body has time to acclimate. My goal would be 510-258 systolic prior to discharge. As stated yesterday; her refractory HTN is largely dependent on volume excess currently. Further eval for secondary causes and optimization will take time. -Continue norvasc 10mg  daily, coreg 25mg  BID, chlorthalidone 25mg  daily, clonidine 0.4mg  tid, doxazosin 8mg  daily, hydralazine 100mg  TID, imdur 120mg  daily -Stop IV lasix and start torsemide 40mg  daily -Start spironolactone 25mg  daily -Push diuretics and hopefully decrease vasodilatory therapy over time -Consider Hyperaldo eval outpatient (renin borderline suppressed) -F/u Renal artery PVL  Non-Oliguric AKI on CKD IV: Possibly cardiorenal syndrome but also as blood pressure is optimized kidney function will likely worsen.  This is expected and appropriate.  However do not want to correct hypertension too quickly which can cause significant kidney abnormalities -Creatinine stable/slightly better today -Continue to monitor daily Cr, Dose meds for GFR -Monitor Daily I/Os, Daily weight  -Maintain MAP>65 for optimal renal perfusion.  -Avoid nephrotoxic medications including NSAIDs and Vanc/Zosyn combo  CHF exacerbation: Most likely related to severe hypertension.  Diuretics and hypertension management as above  Type 2 diabetes: Management per primary team  CAD: Aspirin and statin  Anemia of CKD: Hemoglobin acceptable currently.  No ESA at this time  Recommendations conveyed to primary service.    [facility] 04/19/2021 9:16  AM  ___________________________________________________________  CC: HTN  Interval History/Subjective: Patient states she feels well today.  Persistent lower extremity edema but overall improved.  Blood pressure slightly improved today with systolics around 527-782.   Medications:  Current Facility-Administered Medications  Medication Dose Route Frequency Provider Last Rate Last Admin   0.9 %  sodium chloride infusion  250 mL Intravenous PRN Oswald Hillock, MD       acetaminophen (TYLENOL) tablet 650 mg  650 mg Oral Q6H PRN Etta Quill, DO   650 mg at 04/18/21 0504   amLODipine (NORVASC) tablet 10 mg  10 mg Oral q morning Oswald Hillock, MD   10 mg at 04/18/21 1006   aspirin EC tablet 81 mg  81 mg Oral Daily Oswald Hillock, MD   81 mg at 04/18/21 1006   atorvastatin (LIPITOR) tablet 20 mg  20 mg Oral QPM Oswald Hillock, MD   20 mg at 04/18/21 1706   bisacodyl (DULCOLAX) suppository 10 mg  10 mg Rectal Daily PRN Eulogio Bear U, DO   10 mg at 04/18/21 1323   buPROPion (WELLBUTRIN) tablet 100 mg  100 mg Oral BID Oswald Hillock, MD   100 mg at 04/19/21 0646   carvedilol (COREG) tablet 25 mg  25 mg Oral BID WC Jennette Kettle M, DO   25 mg at 04/18/21 1606   chlorthalidone (HYGROTON) tablet 25 mg  25 mg Oral Daily Reesa Chew, MD   25 mg at 04/18/21 1259   cloNIDine (CATAPRES) tablet 0.4 mg  0.4 mg Oral TID Etta Quill, DO   0.4 mg at 04/18/21 2118   doxazosin (CARDURA) tablet 8 mg  8 mg Oral QPM Buford Dresser, MD   8 mg at 04/18/21 1706   enoxaparin (LOVENOX)  injection 55 mg  55 mg Subcutaneous QHS Oswald Hillock, MD   55 mg at 04/18/21 2117   ferrous sulfate tablet 325 mg  325 mg Oral BID Oswald Hillock, MD   325 mg at 04/18/21 2118   hydrALAZINE (APRESOLINE) injection 10 mg  10 mg Intravenous Q4H PRN Howerter, Justin B, DO   10 mg at 04/19/21 0458   hydrALAZINE (APRESOLINE) tablet 100 mg  100 mg Oral TID Etta Quill, DO   100 mg at 04/18/21 2118   insulin aspart  (novoLOG) injection 0-20 Units  0-20 Units Subcutaneous TID WC Oswald Hillock, MD   3 Units at 04/19/21 0650   insulin aspart (novoLOG) injection 0-5 Units  0-5 Units Subcutaneous QHS Oswald Hillock, MD       isosorbide mononitrate (IMDUR) 24 hr tablet 120 mg  120 mg Oral Daily Buford Dresser, MD   120 mg at 04/18/21 1009   loratadine (CLARITIN) tablet 10 mg  10 mg Oral Daily Oswald Hillock, MD   10 mg at 04/18/21 1005   nitroGLYCERIN (NITROSTAT) SL tablet 0.4 mg  0.4 mg Sublingual Q5 min PRN Oswald Hillock, MD       omega-3 acid ethyl esters (LOVAZA) capsule 1 g  1 capsule Oral Daily Oswald Hillock, MD   1 g at 04/18/21 1005   ondansetron (ZOFRAN) tablet 4 mg  4 mg Oral Q6H PRN Oswald Hillock, MD       Or   ondansetron (ZOFRAN) injection 4 mg  4 mg Intravenous Q6H PRN Oswald Hillock, MD       pantoprazole (PROTONIX) EC tablet 40 mg  40 mg Oral BID AC Vann, Jessica U, DO   40 mg at 04/19/21 0646   predniSONE (DELTASONE) tablet 20 mg  20 mg Oral Q breakfast Eulogio Bear U, DO   20 mg at 04/18/21 0805   pregabalin (LYRICA) capsule 25 mg  25 mg Oral BID Oswald Hillock, MD   25 mg at 04/18/21 2118   senna (SENOKOT) tablet 17.2 mg  2 tablet Oral Daily Eulogio Bear U, DO   17.2 mg at 04/18/21 1005   sertraline (ZOLOFT) tablet 50 mg  50 mg Oral Daily Oswald Hillock, MD   50 mg at 04/18/21 1006   sodium bicarbonate tablet 650 mg  650 mg Oral BID Eulogio Bear U, DO   650 mg at 04/18/21 2118   sodium chloride flush (NS) 0.9 % injection 3 mL  3 mL Intravenous Q12H Oswald Hillock, MD   3 mL at 04/18/21 2120   sodium chloride flush (NS) 0.9 % injection 3 mL  3 mL Intravenous PRN Oswald Hillock, MD       sorbitol 70 % solution 30 mL  30 mL Oral Daily Vann, Jessica U, DO       sorbitol, milk of mag, mineral oil, glycerin (SMOG) enema  960 mL Rectal Once Vann, Jessica U, DO       spironolactone (ALDACTONE) tablet 25 mg  25 mg Oral Daily Reesa Chew, MD       torsemide [facility]) tablet 40 mg  40 mg Oral  Daily Reesa Chew, MD          Review of Systems: 10 systems reviewed and negative except per interval history/subjective  Physical Exam: Vitals:   04/19/21 0439 04/19/21 0544  BP: (!) 198/76 (!) 179/67  Pulse: (!) 58 (!) 57  Resp: 20  Temp: 97.6 F (36.4 C)   SpO2: 100%    Total I/O In: -  Out: 1000 [Urine:1000]  Intake/Output Summary (Last 24 hours) at 04/19/2021 0916 Last data filed at 04/19/2021 0809 Gross per 24 hour  Intake 680 ml  Output 1000 ml  Net -320 ml   Constitutional: well-appearing, no acute distress ENMT: ears and nose without scars or lesions, MMM CV: normal rate, 1+ pitting edema in the bilateral lower extremities Respiratory: Bilateral chest rise, normal work of breathing Gastrointestinal: soft, non-tender, no palpable masses or hernias Skin: no visible lesions or rashes Psych: alert, judgement/insight appropriate, appropriate mood and affect   Test Results I personally reviewed new and old clinical labs and radiology tests Lab Results  Component Value Date   NA 135 04/19/2021   K 3.7 04/19/2021   CL 105 04/19/2021   CO2 24 04/19/2021   BUN 46 (H) 04/19/2021   CREATININE 2.31 (H) 04/19/2021   CALCIUM 8.4 (L) 04/19/2021   ALBUMIN 2.7 (L) 04/16/2021   PHOS 4.4 04/16/2021

## 2021-04-20 DIAGNOSIS — E8779 Other fluid overload: Secondary | ICD-10-CM | POA: Diagnosis not present

## 2021-04-20 DIAGNOSIS — M109 Gout, unspecified: Secondary | ICD-10-CM | POA: Diagnosis not present

## 2021-04-20 DIAGNOSIS — N179 Acute kidney failure, unspecified: Secondary | ICD-10-CM | POA: Diagnosis not present

## 2021-04-20 DIAGNOSIS — D631 Anemia in chronic kidney disease: Secondary | ICD-10-CM | POA: Diagnosis not present

## 2021-04-20 DIAGNOSIS — N1832 Chronic kidney disease, stage 3b: Secondary | ICD-10-CM | POA: Diagnosis not present

## 2021-04-20 DIAGNOSIS — I159 Secondary hypertension, unspecified: Secondary | ICD-10-CM | POA: Diagnosis not present

## 2021-04-20 DIAGNOSIS — Z794 Long term (current) use of insulin: Secondary | ICD-10-CM | POA: Diagnosis not present

## 2021-04-20 DIAGNOSIS — I5033 Acute on chronic diastolic (congestive) heart failure: Secondary | ICD-10-CM | POA: Diagnosis not present

## 2021-04-20 DIAGNOSIS — I509 Heart failure, unspecified: Secondary | ICD-10-CM | POA: Diagnosis not present

## 2021-04-20 DIAGNOSIS — E1122 Type 2 diabetes mellitus with diabetic chronic kidney disease: Secondary | ICD-10-CM | POA: Diagnosis not present

## 2021-04-20 DIAGNOSIS — I5030 Unspecified diastolic (congestive) heart failure: Secondary | ICD-10-CM | POA: Diagnosis not present

## 2021-04-20 DIAGNOSIS — N184 Chronic kidney disease, stage 4 (severe): Secondary | ICD-10-CM | POA: Diagnosis not present

## 2021-04-20 DIAGNOSIS — E1129 Type 2 diabetes mellitus with other diabetic kidney complication: Secondary | ICD-10-CM | POA: Diagnosis not present

## 2021-04-20 DIAGNOSIS — I130 Hypertensive heart and chronic kidney disease with heart failure and stage 1 through stage 4 chronic kidney disease, or unspecified chronic kidney disease: Secondary | ICD-10-CM | POA: Diagnosis not present

## 2021-04-20 LAB — RENAL FUNCTION PANEL
Albumin: 2.9 g/dL — ABNORMAL LOW (ref 3.5–5.0)
Anion gap: 7 (ref 5–15)
BUN: 47 mg/dL — ABNORMAL HIGH (ref 6–20)
CO2: 23 mmol/L (ref 22–32)
Calcium: 8.5 mg/dL — ABNORMAL LOW (ref 8.9–10.3)
Chloride: 108 mmol/L (ref 98–111)
Creatinine, Ser: 2.39 mg/dL — ABNORMAL HIGH (ref 0.44–1.00)
GFR, Estimated: 23 mL/min — ABNORMAL LOW (ref >60)
Glucose, Bld: 150 mg/dL — ABNORMAL HIGH (ref 70–99)
Phosphorus: 3.7 mg/dL (ref 2.5–4.6)
Potassium: 4.3 mmol/L (ref 3.5–5.1)
Sodium: 138 mmol/L (ref 135–145)

## 2021-04-20 LAB — GLUCOSE, CAPILLARY
Glucose-Capillary: 144 mg/dL — ABNORMAL HIGH (ref 70–99)
Glucose-Capillary: 171 mg/dL — ABNORMAL HIGH (ref 70–99)

## 2021-04-20 MED ORDER — SENNA 8.6 MG PO TABS: 2.0000 | ORAL_TABLET | Freq: Every day | ORAL | 0 refills | Status: DC

## 2021-04-20 MED ORDER — CHLORTHALIDONE 50 MG PO TABS: 50.0000 mg | ORAL_TABLET | Freq: Every day | ORAL | 1 refills | Status: DC

## 2021-04-20 MED ORDER — SPIRONOLACTONE 25 MG PO TABS: 25.0000 mg | ORAL_TABLET | Freq: Every day | ORAL | Status: DC

## 2021-04-20 MED ORDER — ISOSORBIDE MONONITRATE ER 120 MG PO TB24: 120.0000 mg | ORAL_TABLET | Freq: Every day | ORAL | 1 refills | Status: DC

## 2021-04-20 MED ORDER — PANTOPRAZOLE SODIUM 40 MG PO TBEC: 40.0000 mg | DELAYED_RELEASE_TABLET | Freq: Every day | ORAL | 0 refills | Status: DC

## 2021-04-20 MED ORDER — SPIRONOLACTONE 25 MG PO TABS: 25.0000 mg | ORAL_TABLET | Freq: Every day | ORAL | 1 refills | Status: DC

## 2021-04-20 MED ORDER — DOXAZOSIN MESYLATE 8 MG PO TABS: 8.0000 mg | ORAL_TABLET | Freq: Every evening | ORAL | 1 refills | Status: DC

## 2021-04-20 MED ORDER — TORSEMIDE 40 MG PO TABS: 40.0000 mg | ORAL_TABLET | Freq: Every day | ORAL | 1 refills | Status: DC

## 2021-04-20 MED ORDER — CHLORTHALIDONE 50 MG PO TABS
50.0000 mg | ORAL_TABLET | Freq: Every day | ORAL | Status: DC
  Administered 2021-04-20: 50 mg via ORAL
  Filled 2021-04-20: qty 1

## 2021-04-20 MED ORDER — SPIRONOLACTONE 25 MG PO TABS
25.0000 mg | ORAL_TABLET | Freq: Two times a day (BID) | ORAL | Status: DC
  Administered 2021-04-20: 25 mg via ORAL
  Filled 2021-04-20: qty 1

## 2021-04-20 NOTE — Discharge Summary (Signed)
Physician Discharge Summary  Maria Hudson QQV:[id number] DOB: [birth year] DOA: 04/14/2021  PCP: Nolene Ebbs, MD  Admit date: 04/14/2021 Discharge date: 04/20/2021  Admitted From: home Discharge disposition: home   Recommendations for Outpatient Follow-Up:   BMP 1 week GI referral for routine colonoscopy Renal follow up Needs sleep study   Discharge Diagnosis:   Active Problems:   Acute on chronic diastolic CHF (congestive heart failure) ([place])    Discharge Condition: Improved.  Diet recommendation: Low sodium, heart healthy.  Carbohydrate-modified  Wound care: None.  Code status: DNR   History of Present Illness:   Maria Hudson  is a 60 y.o. female, with history of chronic diastolic CHF, diabetes mellitus type 2, CKD stage III, hypertension, stroke who was seen at cardiology hypertension clinic, was found to have 18 pound weight gain along with uncontrolled blood pressure and was sent to the ED for further evaluation. Patient has diastolic heart failure with EF 45 to 60%.  She has a history of MI, last cardiac cath was in December 2014 which showed 20% LAD, 5060% left circumflex 100% RPDA lesion with left-to-right collaterals. Patient told them that she had gained 18 pounds in last 2.5 weeks.  She was started on metolazone 2 days ago and has been taking 2 tablets twice a day before diuretics.  She also has dyspnea on exertion.  Also complains of orthopnea.  Worsening lower extremity edema. Patient was sent to the ED for admission for IV diuretics. She complains of epigastric pain Also complains of intermittent chest pain Denies nausea vomiting or diarrhea   Hospital Course by Problem:   Resistant HTN: Overall better yesterday but slightly higher this morning. She runs high at home and pure optimization should take place over time some body has time to acclimate. My goal would be 332-951 systolic prior to discharge. As previously stated, her refractory HTN  is largely dependent on volume excess currently. Further eval for secondary causes and optimization will take time. -Some of her morning hypertension may be due to her medications wearing off.  Clonidine in particular can cause rebound hypertension.  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  This is expected and appropriate.  However do not want to correct hypertension too quickly which can cause significant kidney abnormalities -Creatinine is overall stable -Continue to monitor daily Cr, Dose meds for GFR -Monitor Daily I/Os, Daily weight  -Maintain MAP>65 for optimal renal perfusion.  -Avoid nephrotoxic medications including NSAIDs   CHF exacerbation: Most likely related to severe hypertension.  Diuretics and hypertension management as above   Type 2 diabetes: Management per primary team  -resume home meds  CAD: Aspirin and statin   Anemia of CKD: Hemoglobin acceptable currently.  No ESA at this time  Depression: Stable. -Continue home Zoloft and Wellbutrin.   Hypokalemia: K3.3. -replete   Morbid obesity Body  mass index is 41.75 kg/m.  -Encourage lifestyle change to lose weight. ? Gout -uric acid elevated -prednisone as Cr up x 3  days Constipation -bowel regimen   Medical Consultants:   Cards nephrology    Discharge Exam:   Vitals:   04/20/21 0708 04/20/21 0850  BP: (!) 219/68 (!) 157/77  Pulse:  (!) 59  Resp:  (!) 5  Temp:    SpO2:  100%   Vitals:   04/20/21 0002 04/20/21 0401 04/20/21 0708 04/20/21 0850  BP:  (!) 193/77 (!) 219/68 (!) 157/77  Pulse:  (!) 58  (!) 59  Resp:  18  (!) 5  Temp:  98 F (36.7 C)    TempSrc:      SpO2:  100%  100%  Weight: 106.9 kg     Height:        General exam: Appears calm and comfortable.    The results of significant diagnostics from this hospitalization (including imaging, microbiology, ancillary and laboratory) are listed below for reference.     Procedures and Diagnostic Studies:   DG Chest 2 View  Result Date: 04/14/2021 CLINICAL DATA:  shob EXAM: CHEST - 2 VIEW COMPARISON:  03/05/2021. FINDINGS: Similar cardiomediastinal silhouette, mildly enlarged. Chronic mild interstitial prominence without consolidation. No visible pleural effusions or pneumothorax. No acute osseous abnormality. IMPRESSION: 1. Chronic mild interstitial prominence, mild interstitial edema versus the sequela of recurrent bouts of CHF. No consolidation. 2. Mild cardiomegaly. Electronically Signed   By: Margaretha Sheffield M.D.   On: 04/14/2021 13:01     Labs:   Basic Metabolic Panel: Recent Labs  Lab 04/15/21 0412 04/16/21 0352 04/18/21 0350 04/19/21 0527 04/20/21 0419  NA 138 137 139 135 138  K 3.3* 3.8 3.8 3.7 4.3  CL 111 108 107 105 108  CO2 21* 24 24 24 23   GLUCOSE 141* 142* 138* 167* 150*  BUN 39* 40* 43* 46* 47*  CREATININE 1.83* 2.05* 2.42* 2.31* 2.39*  CALCIUM 8.6* 8.4* 8.6* 8.4* 8.5*  MG  --  2.2  --   --   --   PHOS  --  4.4  --   --  3.7   GFR Estimated Creatinine Clearance: 29.7 mL/min (A) (by C-G formula based on SCr of 2.39 mg/dL (H)). Liver Function Tests: Recent Labs  Lab 04/15/21 0412 04/16/21 0352 04/20/21 0419  AST 17  --   --   ALT 9  --    --   ALKPHOS 42  --   --   BILITOT 0.7  --   --   PROT 5.2*  --   --   ALBUMIN 2.6* 2.7* 2.9*   No results for input(s): LIPASE, AMYLASE in the last 168 hours. No results for input(s): AMMONIA in the last 168 hours. Coagulation profile No results for input(s): INR, PROTIME in the last 168 hours.  CBC: Recent Labs  Lab 04/14/21 1219 04/15/21 0412 04/16/21 0352  WBC 7.7 7.6 5.9  HGB 10.2* 10.1* 10.0*  HCT 30.7* 30.6* 31.5*  MCV 83.2 81.0 82.2  PLT 270 261 276   Cardiac Enzymes: No results for input(s): CKTOTAL, CKMB, CKMBINDEX, TROPONINI in the last 168 hours. BNP: Invalid input(s): POCBNP CBG: Recent Labs  Lab 04/19/21 0646 04/19/21 1118 04/19/21 1638 04/19/21 2103 04/20/21 0600  GLUCAP 138* 184* 237* 236* 144*   D-Dimer No results for input(s): DDIMER in the last 72 hours. Hgb A1c No results for input(s): HGBA1C in the last 72 hours. Lipid Profile No results  for input(s): CHOL, HDL, LDLCALC, TRIG, CHOLHDL, LDLDIRECT in the last 72 hours. Thyroid function studies No results for input(s): TSH, T4TOTAL, T3FREE, THYROIDAB in the last 72 hours.  Invalid input(s): FREET3 Anemia work up No results for input(s): VITAMINB12, FOLATE, FERRITIN, TIBC, IRON, RETICCTPCT in the last 72 hours. Microbiology Recent Results (from the past 240 hour(s))  Resp Panel by RT-PCR (Flu A&B, Covid) Nasopharyngeal Swab     Status: None   Collection Time: 04/14/21  4:34 PM   Specimen: Nasopharyngeal Swab; Nasopharyngeal(NP) swabs in vial transport medium  Result Value Ref Range Status   SARS Coronavirus 2 by RT PCR NEGATIVE NEGATIVE Final    Comment: (NOTE) SARS-CoV-2 target nucleic acids are NOT DETECTED.  The SARS-CoV-2 RNA is generally detectable in upper respiratory specimens during the acute phase of infection. The lowest concentration of SARS-CoV-2 viral copies this assay can detect is 138 copies/mL. A negative result does not preclude SARS-Cov-2 infection and should not be  used as the sole basis for treatment or other patient management decisions. A negative result may occur with  improper specimen collection/handling, submission of specimen other than nasopharyngeal swab, presence of viral mutation(s) within the areas targeted by this assay, and inadequate number of viral copies(<138 copies/mL). A negative result must be combined with clinical observations, patient history, and epidemiological information. The expected result is Negative.  Fact Sheet for Patients:  EntrepreneurPulse.com.au  Fact Sheet for Healthcare Providers:  IncredibleEmployment.be  This test is no t yet approved or cleared by the [place] FDA and  has been authorized for detection and/or diagnosis of SARS-CoV-2 by FDA under an Emergency Use Authorization (EUA). This EUA will remain  in effect (meaning this test can be used) for the duration of the COVID-19 declaration under Section 564(b)(1) of the Act, 21 U.S.C.section 360bbb-3(b)(1), unless the authorization is terminated  or revoked sooner.       Influenza A by PCR NEGATIVE NEGATIVE Final   Influenza B by PCR NEGATIVE NEGATIVE Final    Comment: (NOTE) The Xpert Xpress SARS-CoV-2/FLU/RSV plus assay is intended as an aid in the diagnosis of influenza from Nasopharyngeal swab specimens and should not be used as a sole basis for treatment. Nasal washings and aspirates are unacceptable for Xpert Xpress SARS-CoV-2/FLU/RSV testing.  Fact Sheet for Patients: EntrepreneurPulse.com.au  Fact Sheet for Healthcare Providers: IncredibleEmployment.be  This test is not yet approved or cleared by the [place] FDA and has been authorized for detection and/or diagnosis of SARS-CoV-2 by FDA under an Emergency Use Authorization (EUA). This EUA will remain in effect (meaning this test can be used) for the duration of the COVID-19 declaration under Section  564(b)(1) of the Act, 21 U.S.C. section 360bbb-3(b)(1), unless the authorization is terminated or revoked.  Performed at [facility], [place] [address], [place], [place] [address]      Discharge Instructions:   Discharge Instructions     (HEART FAILURE PATIENTS) Call MD:  Anytime you have any of the following symptoms: 1) 3 pound weight gain in 24 hours or 5 pounds in 1 week 2) shortness of breath, with or without a dry hacking cough 3) swelling in the hands, feet or stomach 4) if you have to sleep on extra pillows at night in order to breathe.   Complete by: As directed    Diet - low sodium heart healthy   Complete by: As directed    Diet Carb Modified   Complete by: As directed    Discharge instructions  Complete by: As directed    You will follow up outpatient for consideration of SGLT2i for DM/heart Monitor your blood sugar closely, may need a lower dose of insulin pending improvement in your diet or change in kidney function BMP 1 week Sleep study has been requested and it appears that it is waiting on insurance authorization   Heart Failure patients record your daily weight using the same scale at the same time of day   Complete by: As directed    Increase activity slowly   Complete by: As directed       Allergies as of 04/20/2021       Reactions   Hydrocodone Itching   Patient able to tolerate when taken with Benadryl.   Oxycodone-acetaminophen Itching   Patient able to tolerate when taken with Benadryl.        Medication List     STOP taking these medications    bumetanide 1 MG tablet Commonly known as: Bumex   bumetanide 2 MG tablet Commonly known as: BUMEX   metolazone 2.5 MG tablet Commonly known as: ZAROXOLYN   sodium bicarbonate 650 MG tablet   Turmeric 500 MG Caps   Victoza 18 MG/3ML Sopn Generic drug: liraglutide       TAKE these medications    allopurinol 100 MG tablet Commonly known as: ZYLOPRIM Take 100 mg by mouth daily.    amLODipine 10 MG tablet Commonly known as: NORVASC Take 10 mg by mouth every morning.   aspirin EC 81 MG tablet Take 81 mg by mouth daily. Swallow whole.   atorvastatin 20 MG tablet Commonly known as: LIPITOR Take 20 mg by mouth every evening.   buPROPion 100 MG tablet Commonly known as: WELLBUTRIN Take 100 mg by mouth 2 (two) times daily.   carvedilol 25 MG tablet Commonly known as: COREG Take 1 tablet (25 mg total) by mouth 2 (two) times daily with a meal.   chlorthalidone 50 MG tablet Commonly known as: HYGROTON Take 1 tablet (50 mg total) by mouth daily. Start taking on: April 21, 2021   cloNIDine 0.2 MG tablet Commonly known as: CATAPRES Take 0.4 mg by mouth 3 (three) times daily. What changed: Another medication with the same name was removed. Continue taking this medication, and follow the directions you see here.   doxazosin 8 MG tablet Commonly known as: CARDURA Take 1 tablet (8 mg total) by mouth every evening. What changed:  medication strength how much to take   ferrous sulfate 325 (65 FE) MG tablet Take 325 mg by mouth 2 (two) times daily.   Fish Oil 1000 MG Caps Take 1 capsule by mouth daily.   fluticasone 50 MCG/ACT nasal spray Commonly known as: FLONASE Place 1 spray into both nostrils daily as needed for allergies or rhinitis.   hydrALAZINE 100 MG tablet Commonly known as: APRESOLINE Take 1 tablet (100 mg total) by mouth 3 (three) times daily.   insulin glargine 100 unit/mL Sopn Commonly known as: LANTUS Inject 20 Units into the skin at bedtime.   isosorbide mononitrate 120 MG 24 hr tablet Commonly known as: IMDUR Take 1 tablet (120 mg total) by mouth daily. Start taking on: April 21, 2021 What changed:  medication strength how much to take   loratadine 10 MG tablet Commonly known as: CLARITIN Take 10 mg by mouth daily.   Multivitamin Adult Tabs Take 1 tablet by mouth daily.   nitroGLYCERIN 0.4 MG SL tablet Commonly known  as: NITROSTAT Place 1 tablet (0.4  mg total) under the tongue every 5 (five) minutes as needed for chest pain.   NovoLOG FlexPen 100 UNIT/ML FlexPen Generic drug: insulin aspart Inject 8 Units into the skin 3 (three) times daily with meals.   pantoprazole 40 MG tablet Commonly known as: PROTONIX Take 1 tablet (40 mg total) by mouth daily.   polyethylene glycol 17 g packet Commonly known as: MIRALAX / GLYCOLAX Take 17 g by mouth daily.   pregabalin 25 MG capsule Commonly known as: LYRICA Take 25 mg by mouth 2 (two) times daily.   senna 8.6 MG Tabs tablet Commonly known as: SENOKOT Take 2 tablets (17.2 mg total) by mouth daily. Start taking on: April 21, 2021   sertraline 50 MG tablet Commonly known as: ZOLOFT Take 50 mg by mouth daily.   spironolactone 25 MG tablet Commonly known as: ALDACTONE Take 1 tablet (25 mg total) by mouth daily. Start taking on: April 21, 2021   Torsemide 40 MG Tabs Take 40 mg by mouth daily. Start taking on: April 21, 2021        Follow-up Information     Nolene Ebbs, MD Follow up in 1 week(s).   Specialty: Internal Medicine Contact information: [place] [address] [phone]         Skeet Latch, MD .   Specialty: Cardiology Contact information: [address] [place] [address] [phone]         Edrick Oh, MD Follow up.   Specialty: Nephrology Why: office should call for your appointment Contact information: [place] [address] [phone]                  Time coordinating discharge: 35 min  Signed:  [name] DO  [facility] 04/20/2021, 11:08 AM

## 2021-04-20 NOTE — Progress Notes (Signed)
Patient discharged: Home with family.  Left via: Wheelchair  Discharge paperwork reviewed and given: to patient and family. Teach back completed. IV and telemetry disconnected. Belongings given to patient.  

## 2021-04-20 NOTE — TOC Transition Note (Addendum)
Transition of Care [facility]) - CM/SW Discharge Note   Patient Details  Name: Maria Hudson MRN: [id number] Date of Birth: [birth year]  Transition of Care [facility]) CM/SW Contact:  Zenon Mayo, RN Phone Number: 04/20/2021, 12:29 PM   Clinical Narrative:    Patient is from home with daughter and son and grandkids, her daughter will transport her home. She still drives as well, she uses walker/cane. She has a scale at home and will try to weigh her self more, she checks her bp, she eats a low sodium diet,states she does not like salt.  She has no issues with getting her meds or taking them. She has no issues with transport to [facility]    Final next level of care: Home/Self Care Barriers to Discharge: No Barriers Identified   Patient Goals and CMS Choice Patient states their goals for this hospitalization and ongoing recovery are:: return home   Choice offered to / list presented to : NA  Discharge Placement                       Discharge Plan and Services In-house Referral: NA Discharge Planning Services: CM Consult              DME Agency: NA       HH Arranged: NA          Social Determinants of Health (SDOH) Interventions Food Insecurity Interventions: Intervention Not Indicated Financial Strain Interventions: Intervention Not Indicated Housing Interventions: Intervention Not Indicated Transportation Interventions: Intervention Not Indicated   Readmission Risk Interventions Readmission Risk Prevention Plan 04/20/2021  Transportation Screening Complete  PCP or Specialist Appt within 3-5 Days Complete  HRI or Home Care Consult Complete  Social Work Consult for [place] Planning/Counseling Complete  Palliative Care Screening Not Applicable  Medication Review Press photographer) Complete

## 2021-04-20 NOTE — TOC Initial Note (Signed)
Transition of Care [facility]) - Initial/Assessment Note    Patient Details  Name: Maria Hudson MRN: [id number] Date of Birth: 1960-11-17  Transition of Care [facility]) CM/SW Contact:    Zenon Mayo, RN Phone Number: 04/20/2021, 12:26 PM  Clinical Narrative:                 Patient is from home with daughter and son and grandkids, her daughter will transport her home. She still drives as well, she uses walker/cane. She has a scale at home and will try to weigh her self more, she checks her bp, she eats a low sodium diet,states she does not like salt.  She has no issues with getting her meds or taking them.  Expected Discharge Plan: Home/Self Care Barriers to Discharge: No Barriers Identified   Patient Goals and CMS Choice Patient states their goals for this hospitalization and ongoing recovery are:: return home   Choice offered to / list presented to : NA  Expected Discharge Plan and Services Expected Discharge Plan: Home/Self Care In-house Referral: NA Discharge Planning Services: CM Consult   Living arrangements for the past 2 months: Single Family Home Expected Discharge Date: 04/20/21                 DME Agency: NA       HH Arranged: NA          Prior Living Arrangements/Services Living arrangements for the past 2 months: Single Family Home Lives with:: Adult Children Patient language and need for interpreter reviewed:: Yes Do you feel safe going back to the place where you live?: Yes      Need for Family Participation in Patient Care: Yes (Comment) Care giver support system in place?: Yes (comment) Current home services: DME (walker and a cane) Criminal Activity/Legal Involvement Pertinent to Current Situation/Hospitalization: No - Comment as needed  Activities of Daily Living Home Assistive Devices/Equipment: Cane (specify quad or straight), Walker (specify type) ADL Screening (condition at time of admission) Patient's cognitive ability adequate to safely  complete daily activities?: Yes Is the patient deaf or have difficulty hearing?: No Does the patient have difficulty seeing, even when wearing glasses/contacts?: No Does the patient have difficulty concentrating, remembering, or making decisions?: No Patient able to express need for assistance with ADLs?: Yes Does the patient have difficulty dressing or bathing?: No Independently performs ADLs?: Yes (appropriate for developmental age) Does the patient have difficulty walking or climbing stairs?: Yes Weakness of Legs: Both Weakness of Arms/Hands: None  Permission Sought/Granted                  Emotional Assessment Appearance:: Appears stated age Attitude/Demeanor/Rapport: Engaged Affect (typically observed): Appropriate Orientation: : Oriented to Self, Oriented to Place, Oriented to  Time, Oriented to Situation Alcohol / Substance Use: Not Applicable Psych Involvement: No (comment)  Admission diagnosis:  Acute on chronic diastolic CHF (congestive heart failure) ([place]) [I50.33] Acute on chronic congestive heart failure, unspecified heart failure type ([place]) [I50.9] Patient Active Problem List   Diagnosis Date Noted   Hypertensive urgency 04/14/2021   Acute on chronic diastolic CHF (congestive heart failure) ([place]) 04/14/2021   Chest pain 03/05/2021   CAD (coronary artery disease) 03/05/2021   T2DM (type 2 diabetes mellitus) ([place]) 03/05/2021   CKD (chronic kidney disease) stage 4, GFR 15-29 ml/min ([place]) 03/05/2021   Class 3 obesity ([place]) 03/05/2021   CVA (cerebral vascular accident) ([place]) 03/05/2021   Acute on chronic diastolic heart failure ([place]) 03/05/2021  Essential hypertension 03/05/2021   AKI (acute kidney injury) ([place]) 03/05/2021   PCP:  Nolene Ebbs, MD Pharmacy:   [id number] - [place], [place] - [address] [place] [address] Phone: [phone] Fax: [phone]     Social Determinants of  Health (SDOH) Interventions Food Insecurity Interventions: Intervention Not Indicated Financial Strain Interventions: Intervention Not Indicated Housing Interventions: Intervention Not Indicated Transportation Interventions: Intervention Not Indicated  Readmission Risk Interventions Readmission Risk Prevention Plan 04/20/2021  Transportation Screening Complete  PCP or Specialist Appt within 3-5 Days Complete  HRI or Home Care Consult Complete  Social Work Consult for [place] Planning/Counseling Complete  Palliative Care Screening Not Applicable  Medication Review Press photographer) Complete

## 2021-04-20 NOTE — Progress Notes (Addendum)
Nephrology Follow-Up Consult note   Assessment/Recommendations: Maria Hudson is a/an 60 y.o. female with a past medical history significant for diastolic CHF, Type 2 DM, CKD Stage IV, CVA, HTN, morbid obesity , admitted for CHF exacerbation and severe HTN.     Resistant HTN: Overall better yesterday but slightly higher this morning. She runs high at home and pure optimization should take place over time some body has time to acclimate. My goal would be 299-371 systolic prior to discharge. As previously stated, her refractory HTN is largely dependent on volume excess currently. Further eval for secondary causes and optimization will take time. -Some of her morning hypertension may be due to her medications wearing off.  Clonidine in particular can cause rebound hypertension.  Ideally would want to get this medication decreased over time particularly given her relative bradycardia -Continue norvasc 10mg  daily, coreg 25mg  BID, clonidine 0.4mg  tid, doxazosin 8mg  daily, hydralazine 100mg  TID, imdur 120mg  daily, torsemide 40 daily, spironolactone 25mg  daily -Increase chlorthalidone to 50 mg daily  -Push diuretics and hopefully decrease vasodilatory and rate controlling therapy over time -Consider Hyperaldo eval outpatient (renin borderline suppressed) -Renal artery PVL w/o bilateral stenosis; unlikely contributing to HTN -I will set up labs next week and follow up hopefully the week after  Non-Oliguric AKI on CKD IV: Possibly cardiorenal syndrome but also as blood pressure is optimized kidney function will likely worsen.  This is expected and appropriate.  However do not want to correct hypertension too quickly which can cause significant kidney abnormalities -Creatinine is overall stable -Continue to monitor daily Cr, Dose meds for GFR -Monitor Daily I/Os, Daily weight  -Maintain MAP>65 for optimal renal perfusion.  -Avoid nephrotoxic medications including NSAIDs and Vanc/Zosyn combo  CHF  exacerbation: Most likely related to severe hypertension.  Diuretics and hypertension management as above  Type 2 diabetes: Management per primary team  CAD: Aspirin and statin  Anemia of CKD: Hemoglobin acceptable currently.  No ESA at this time  Recommendations conveyed to primary service.    [facility] 04/20/2021 8:26 AM  ___________________________________________________________  CC: HTN  Interval History/Subjective: Patient continues to feel well today.  Blood pressure is better yesterday but higher this morning.  Weight is about the same today.   Medications:  Current Facility-Administered Medications  Medication Dose Route Frequency Provider Last Rate Last Admin   0.9 %  sodium chloride infusion  250 mL Intravenous PRN Oswald Hillock, MD       acetaminophen (TYLENOL) tablet 650 mg  650 mg Oral Q6H PRN Etta Quill, DO   650 mg at 04/18/21 0504   amLODipine (NORVASC) tablet 10 mg  10 mg Oral q morning Oswald Hillock, MD   10 mg at 04/19/21 1001   aspirin EC tablet 81 mg  81 mg Oral Daily Oswald Hillock, MD   81 mg at 04/19/21 0954   atorvastatin (LIPITOR) tablet 20 mg  20 mg Oral QPM Oswald Hillock, MD   20 mg at 04/19/21 1719   bisacodyl (DULCOLAX) suppository 10 mg  10 mg Rectal Daily PRN Eulogio Bear U, DO   10 mg at 04/18/21 1323   buPROPion (WELLBUTRIN) tablet 100 mg  100 mg Oral BID Oswald Hillock, MD   100 mg at 04/20/21 0634   carvedilol (COREG) tablet 25 mg  25 mg Oral BID WC Jennette Kettle M, DO   25 mg at 04/19/21 1540   chlorthalidone (HYGROTON) tablet 50 mg  50 mg  Oral Daily Reesa Chew, MD   50 mg at 04/20/21 0710   cloNIDine (CATAPRES) tablet 0.4 mg  0.4 mg Oral TID Etta Quill, DO   0.4 mg at 04/19/21 2228   doxazosin (CARDURA) tablet 8 mg  8 mg Oral QPM Buford Dresser, MD   8 mg at 04/19/21 1720   enoxaparin (LOVENOX) injection 55 mg  55 mg Subcutaneous QHS Oswald Hillock, MD   55 mg at 04/19/21 2213    ferrous sulfate tablet 325 mg  325 mg Oral BID Oswald Hillock, MD   325 mg at 04/19/21 2212   hydrALAZINE (APRESOLINE) injection 10 mg  10 mg Intravenous Q4H PRN Howerter, Justin B, DO   10 mg at 04/20/21 0736   hydrALAZINE (APRESOLINE) tablet 100 mg  100 mg Oral TID Etta Quill, DO   100 mg at 04/19/21 2212   insulin aspart (novoLOG) injection 0-20 Units  0-20 Units Subcutaneous TID WC Oswald Hillock, MD   3 Units at 04/20/21 5809   insulin aspart (novoLOG) injection 0-5 Units  0-5 Units Subcutaneous QHS Oswald Hillock, MD   2 Units at 04/19/21 2217   isosorbide mononitrate (IMDUR) 24 hr tablet 120 mg  120 mg Oral Daily Buford Dresser, MD   120 mg at 04/19/21 0954   loratadine (CLARITIN) tablet 10 mg  10 mg Oral Daily Oswald Hillock, MD   10 mg at 04/19/21 0955   nitroGLYCERIN (NITROSTAT) SL tablet 0.4 mg  0.4 mg Sublingual Q5 min PRN Oswald Hillock, MD       omega-3 acid ethyl esters (LOVAZA) capsule 1 g  1 capsule Oral Daily Oswald Hillock, MD   1 g at 04/19/21 0952   ondansetron (ZOFRAN) tablet 4 mg  4 mg Oral Q6H PRN Oswald Hillock, MD       Or   ondansetron [facility]) injection 4 mg  4 mg Intravenous Q6H PRN Oswald Hillock, MD       pantoprazole (PROTONIX) EC tablet 40 mg  40 mg Oral BID AC Vann, Jessica U, DO   40 mg at 04/20/21 0634   pregabalin (LYRICA) capsule 25 mg  25 mg Oral BID Oswald Hillock, MD   25 mg at 04/19/21 2212   senna (SENOKOT) tablet 17.2 mg  2 tablet Oral Daily Eulogio Bear U, DO   17.2 mg at 04/19/21 0954   sertraline (ZOLOFT) tablet 50 mg  50 mg Oral Daily Oswald Hillock, MD   50 mg at 04/19/21 0952   sodium chloride flush (NS) 0.9 % injection 3 mL  3 mL Intravenous Q12H Oswald Hillock, MD   3 mL at 04/19/21 1001   sodium chloride flush (NS) 0.9 % injection 3 mL  3 mL Intravenous PRN Oswald Hillock, MD       sorbitol 70 % solution 30 mL  30 mL Oral Daily Vann, Jessica U, DO   30 mL at 04/19/21 0955   sorbitol, milk of mag, mineral oil, glycerin (SMOG) enema  960 mL  Rectal Once Vann, Jessica U, DO       spironolactone (ALDACTONE) tablet 25 mg  25 mg Oral BID Reesa Chew, MD       torsemide [facility]) tablet 40 mg  40 mg Oral Daily Reesa Chew, MD   40 mg at 04/19/21 9833      Review of Systems: 10 systems reviewed and negative except per interval history/subjective  Physical Exam:  Vitals:   04/20/21 0401 04/20/21 0708  BP: (!) 193/77 (!) 219/68  Pulse: (!) 58   Resp: 18   Temp: 98 F (36.7 C)   SpO2: 100%    No intake/output data recorded.  Intake/Output Summary (Last 24 hours) at 04/20/2021 0826 Last data filed at 04/20/2021 0409 Gross per 24 hour  Intake 600 ml  Output 1851 ml  Net -1251 ml   Constitutional: well-appearing, no acute distress ENMT: ears and nose without scars or lesions, MMM CV: normal rate, 1+ pitting edema in the bilateral lower extremities Respiratory: Bilateral chest rise, normal work of breathing Gastrointestinal: soft, non-tender, no palpable masses or hernias Skin: no visible lesions or rashes Psych: alert, judgement/insight appropriate, appropriate mood and affect   Test Results I personally reviewed new and old clinical labs and radiology tests Lab Results  Component Value Date   NA 138 04/20/2021   K 4.3 04/20/2021   CL 108 04/20/2021   CO2 23 04/20/2021   BUN 47 (H) 04/20/2021   CREATININE 2.39 (H) 04/20/2021   CALCIUM 8.5 (L) 04/20/2021   ALBUMIN 2.9 (L) 04/20/2021   PHOS 3.7 04/20/2021

## 2021-04-21 ENCOUNTER — Telehealth: Payer: Self-pay

## 2021-04-21 NOTE — Telephone Encounter (Signed)
Transition Care Management Follow-up Telephone Call Date of discharge and from where: 04/20/2021 from [facility] How have you been since you were released from the hospital? Pt stated that she is feeling okay.  Any questions or concerns? No  Items Reviewed: Did the pt receive and understand the discharge instructions provided? Yes  Medications obtained and verified? Yes  Other? No  Any new allergies since your discharge? No  Dietary orders reviewed? No Do you have support at home? Yes   Functional Questionnaire: (I = Independent and D = Dependent) ADLs: I  Bathing/Dressing- I  Meal Prep- I  Eating- I  Maintaining continence- I  Transferring/Ambulation- I  Managing Meds- I   Follow up appointments reviewed:  PCP Hospital f/u appt confirmed? Yes  Scheduled to see Nolene Ebbs, MD on 04/23/2021. [facility] f/u appt confirmed? Yes  Scheduled to see [facility] on 04/24/2021 @ 9:00am. Are transportation arrangements needed? No  If their condition worsens, is the pt aware to call PCP or go to the Emergency Dept.? Yes Was the patient provided with contact information for the PCP's office or ED? Yes Was to pt encouraged to call back with questions or concerns? Yes

## 2021-04-22 NOTE — Progress Notes (Addendum)
HEART & VASCULAR TRANSITION OF CARE CONSULT NOTE     Referring Physician: Dr. Harrell Gave, Cardiology  Primary Care: Nolene Ebbs, MD  Primary Cardiologist: Skeet Latch, MD Nephrologist: Dr. Justin Mend    HPI: Referred to clinic by Dr. Harrell Gave for heart failure consultation.   60 y/o female w/ h/o remote MI/CAD, chronic diastolic heart failure, uncontrolled HTN, Stage IV CKD, T2DM  OSA not on CPAP and pulmonary HTN.    Had MI in 05/2013. LHC showed 20% LAD, 50 to 60% left circumflex, and 100% RPDA lesions with left-to-right collaterals. CAD managed medically.   She reports a long h/o diastolic heart failure and frequent hospitalizations. She just recently moved to [place] from [place] ~1 year ago and had been followed by [facility]. Has struggled w/ volume control and readjustment of diuretics over the years. She reports previously being prescribed Wilder Glade but had to stop b/c her insurance would not cover it. Her last echo at [facility] was 7/21. LVEF was 55%, RV normal. RVSP elevated at 60 mmHg. PYP at [facility] was neg for amyloid. Serum IFE not suggestive of monoclonal gammopathy. Urine IFE and immunoglobin free light chains were elevated (lg FLCK 9.24, Ig FLCL 3.75, K/L ratio 2.46. Repeat testing recommended 6 months later, but do not see that this was completed)  Recently referred to HTN clinic 9/22 for uncontrolled HTN. Now on multidrug regimen. Thyroid function, renin, and aldosterone were normal, as were catecholamines and metanephrines. Her PTH was elevated at 134 but calcium was normal. Renal artery doppler study was limited. There was no evidence of right RAS however the left renal artery was not well visualized due to the presence of bowel gas.  Echo was obtained 9/22 showing normal LVEF 55-60%, G1DD and normal RV. She was also ordered to get a repeat sleep study so that she could get a new CPAP device but this has not been completed yet.   She has struggled w/ fluid management  recently, c/b stage IV CKD. Baseline SCr ~2.3. Last GFR 23. Followed by nephrology. Previously on Bumex and metolazone. She was recently admitted 28/78 for a/c diastolic CHF and diuresed w/ IV Lasix. Did not get RHC. Transitioned back to PO diuretics, Torsemide 40 mg once daily + spiro 25 mg daily. D/w Wt 231 lb. Referred to [facility] clinic.   Presents today for f/u. Wt down 4 lb post hospital at 231 lb. Still struggles w/ NYHA Class III symptoms + orthopnea. SOB walking around her house. No resting dyspnea. Denies CP. Still w/o CPAP. Sleep study arranged for 12/8. BP elevated 160/70 but has not yet taken AM meds. EKG shows NSR w/ PACs 61 bpm.  Reports good UOP w/ torsemide.      Cardiac Testing  2D Echo 02/2021  Left ventricular ejection fraction, by estimation, is 55 to 60%. The left ventricle has normal function. The left ventricle has no regional wall motion abnormalities. There is mild left ventricular hypertrophy. Left ventricular diastolic parameters are consistent with Grade I diastolic dysfunction (impaired relaxation). Elevated left atrial pressure. 1. 2. Right ventricular systolic function is normal. The right ventricular size is normal. 3. Left atrial size was mildly dilated. The mitral valve is normal in structure. Trivial mitral valve regurgitation. No evidence of mitral stenosis. 4. The aortic valve is tricuspid. Aortic valve regurgitation is not visualized. No aortic stenosis is present. 5. The inferior vena cava is normal in size with greater than 50% respiratory variability, suggesting right atrial pressure of 3 mmHg.  Review  of Systems: [y] = yes, [ ]  = no   General: Weight gain [ ] ; Weight loss [ ] ; Anorexia [ ] ; Fatigue [ ] ; Fever [ ] ; Chills [ ] ; Weakness [ ]   Cardiac: Chest pain/pressure [ ] ; Resting SOB [ ] ; Exertional SOB [Y ]; Orthopnea [ Y]; Pedal Edema [ ] ; Palpitations [ ] ; Syncope [ ] ; Presyncope [ ] ; Paroxysmal nocturnal dyspnea[ ]   Pulmonary: Cough [ ] ;  Wheezing[ ] ; Hemoptysis[ ] ; Sputum [ ] ; Snoring [ ]   GI: Vomiting[ ] ; Dysphagia[ ] ; Melena[ ] ; Hematochezia [ ] ; Heartburn[ ] ; Abdominal pain [ ] ; Constipation [ ] ; Diarrhea [ ] ; BRBPR [ ]   GU: Hematuria[ ] ; Dysuria [ ] ; Nocturia[ ]   Vascular: Pain in legs with walking [ ] ; Pain in feet with lying flat [ ] ; Non-healing sores [ ] ; Stroke [ ] ; TIA [ ] ; Slurred speech [ ] ;  Neuro: Headaches[ ] ; Vertigo[ ] ; Seizures[ ] ; Paresthesias[ ] ;Blurred vision [ ] ; Diplopia [ ] ; Vision changes [ ]   Ortho/Skin: Arthritis [ ] ; Joint pain [ ] ; Muscle pain [ ] ; Joint swelling [ ] ; Back Pain [ ] ; Rash [ ]   Psych: Depression[ ] ; Anxiety[ ]   Heme: Bleeding problems [ ] ; Clotting disorders [ ] ; Anemia [ ]   Endocrine: Diabetes [ Y]; Thyroid dysfunction[ ]    Past Medical History:  Diagnosis Date   Acute on chronic diastolic heart failure ([place]) 03/05/2021   CHF (congestive heart failure) (HCC)    CKD (chronic kidney disease) stage 4, GFR 15-29 ml/min ([place]) 03/05/2021   Diabetes mellitus without complication ([place])    Hypertension    Hypertensive urgency 04/14/2021   Renal disorder    Stroke [facility])     Current Outpatient Medications  Medication Sig Dispense Refill   allopurinol (ZYLOPRIM) 100 MG tablet Take 100 mg by mouth daily.     amLODipine (NORVASC) 10 MG tablet Take 10 mg by mouth every morning.     aspirin EC 81 MG tablet Take 81 mg by mouth daily. Swallow whole.     atorvastatin (LIPITOR) 20 MG tablet Take 20 mg by mouth every evening.     buPROPion (WELLBUTRIN) 100 MG tablet Take 100 mg by mouth 2 (two) times daily.     carvedilol (COREG) 25 MG tablet Take 1 tablet (25 mg total) by mouth 2 (two) times daily with a meal. 60 tablet 0   chlorthalidone (HYGROTON) 50 MG tablet Take 1 tablet (50 mg total) by mouth daily. 30 tablet 1   cloNIDine (CATAPRES) 0.2 MG tablet Take 0.4 mg by mouth 3 (three) times daily.     doxazosin (CARDURA) 8 MG tablet Take 1 tablet (8 mg total) by mouth every evening. 30 tablet 1    ferrous sulfate 325 (65 FE) MG tablet Take 325 mg by mouth 2 (two) times daily.     fluticasone (FLONASE) 50 MCG/ACT nasal spray Place 1 spray into both nostrils daily as needed for allergies or rhinitis.     hydrALAZINE (APRESOLINE) 100 MG tablet Take 1 tablet (100 mg total) by mouth 3 (three) times daily. 270 tablet 3   insulin glargine (LANTUS) 100 unit/mL SOPN Inject 20 Units into the skin at bedtime.     isosorbide mononitrate (IMDUR) 120 MG 24 hr tablet Take 1 tablet (120 mg total) by mouth daily. 30 tablet 1   loratadine (CLARITIN) 10 MG tablet Take 10 mg by mouth daily.     Multiple Vitamin (MULTIVITAMIN ADULT) TABS Take 1 tablet by mouth daily.  nitroGLYCERIN (NITROSTAT) 0.4 MG SL tablet Place 1 tablet (0.4 mg total) under the tongue every 5 (five) minutes as needed for chest pain. 20 tablet 0   NOVOLOG FLEXPEN 100 UNIT/ML FlexPen Inject 8 Units into the skin 3 (three) times daily with meals.     Omega-3 Fatty Acids (FISH OIL) 1000 MG CAPS Take 1 capsule by mouth daily.     pantoprazole (PROTONIX) 40 MG tablet Take 1 tablet (40 mg total) by mouth daily. 30 tablet 0   polyethylene glycol (MIRALAX / GLYCOLAX) 17 g packet Take 17 g by mouth daily. 14 each 0   pregabalin (LYRICA) 25 MG capsule Take 25 mg by mouth 2 (two) times daily.     senna (SENOKOT) 8.6 MG TABS tablet Take 2 tablets (17.2 mg total) by mouth daily. 120 tablet 0   sertraline (ZOLOFT) 50 MG tablet Take 50 mg by mouth daily.     spironolactone (ALDACTONE) 25 MG tablet Take 1 tablet (25 mg total) by mouth daily. 30 tablet 1   torsemide 40 MG TABS Take 40 mg by mouth daily. 30 tablet 1   traZODone (DESYREL) 50 MG tablet Take 50 mg by mouth at bedtime.     No current facility-administered medications for this encounter.    Allergies  Allergen Reactions   Hydrocodone Itching    Patient able to tolerate when taken with Benadryl.   Oxycodone-Acetaminophen Itching    Patient able to tolerate when taken with Benadryl.       Social History   Socioeconomic History   Marital status: Divorced    Spouse name: Not on file   Number of children: 2   Years of education: Not on file   Highest education level: Some college, no degree  Occupational History   Occupation: retired  Tobacco Use   Smoking status: Never    Passive exposure: Never   Smokeless tobacco: Never  Vaping Use   Vaping Use: Never used  Substance and Sexual Activity   Alcohol use: Not Currently   Drug use: Not Currently   Sexual activity: Not on file  Other Topics Concern   Not on file  Social History Narrative   Not on file   Social Determinants of Health   Financial Resource Strain: Low Risk    Difficulty of Paying Living Expenses: Not very hard  Food Insecurity: No Food Insecurity   Worried About Charity fundraiser in the Last Year: Never true   [place] in the Last Year: Never true  Transportation Needs: No Transportation Needs   Lack of Transportation (Medical): No   Lack of Transportation (Non-Medical): No  Physical Activity: Not on file  Stress: Not on file  Social Connections: Not on file  Intimate Partner Violence: Not on file      Family History  Problem Relation Age of Onset   Hypertension Mother    Stroke Mother    Hypertension Father    Hypertension Sister    Hypertension Son    Hypertension Daughter     Vitals:   04/24/21 0912  BP: (!) 160/70  Pulse: (!) 56  SpO2: 98%  Weight: 105.1 kg (231 lb 12.8 oz)    PHYSICAL EXAM: General:  Well appearing, moderately obese. No respiratory difficulty HEENT: normal Neck: supple. no JVD. Carotids 2+ bilat; no bruits. No lymphadenopathy or thryomegaly appreciated. Cor: PMI nondisplaced. Regular rate & rhythm. No rubs, gallops or murmurs. Lungs: clear Abdomen: soft, nontender, nondistended. No hepatosplenomegaly. No bruits  or masses. Good bowel sounds. Extremities: no cyanosis, clubbing, rash, edema Neuro: alert & oriented x 3, cranial nerves  grossly intact. moves all 4 extremities w/o difficulty. Affect pleasant.  ECG: NSR w/ PACs 61 bpm    ASSESSMENT & PLAN:  1. Chronic Diastolic Heart Failure - Echo 9/22 EF 55-60%, G1DD, mild LVH Normal RV - H/o longstanding HTN - PYP scan at [facility] 11/20 negative for amyloid but immunoglobulin free light chains were elevated  - NYHA Class III, chronic. Has had frequent hospitalizations for a/c dCHF over the years at [facility] and recent hospitalization at [facility] - I will refer her to the [facility] for further management. Would benefit from [place] and potential CardioMEMs placement. Also consider repeat PYP Scan  - Continue Torsemide 40 mg daily  - Check BMP today, if GFR >20 will add Jardiance 10 mg daily. HF clinic PharmD can help w/ insurance/patient assistance.  - Continue Spiro 25 mg daily  - Continue Coreg 25 mg bid   2. Pulmonary HTN  - Echo at [facility] 7/21: RVSP was 60 mmHg. RV normal  - no RVSP estimate on recent echo 9/22 - NYHA Class III - Suspect combination WHO Group 2 + 3 - Refer to [facility]. Would benefit from [place]  - c/w diuretics - sleep study ordered for OSA   3. Stage IV CKD - baseline SCr ~ 2.3, recent GFR 23  - followed by nephrology, Dr. Justin Mend  - Check BMP today, if GFR > 20, will start Jardiance 10 mg daily w/ repeat BMP in 1 week   4. HTN - h/o poor control, recently established care in the HTN clinic. Now on multidrug regimen - Thyroid function, renin, and aldosterone were normal, as were catecholamines and metanephrines.  - PTH was elevated at 134 but calcium was normal.  - Renal artery doppler study was limited. There was no evidence of right RAS however the left renal artery was not well visualized due to the presence of bowel gas. - consider repeat RA dopplers vs MRA to r/o RAS  - needs treatment of OSA (sleep study planned 12/8) - BP moderately elevated today but has not yet taken AM meds. No adjustments today, need to avoid hypotension given CKD  - Dr. Oval Linsey managing  regimen in HTN Clinic   5. Type 2DM  - controlled, Hgb A1c 6.7  6. CAD:  - remote MI in 2014, cath showed 20% LAD, 50 to 60% left circumflex, and 100% RPDA lesions with left-to-right collaterals, treated medically  - stable w/o CP  - on ASA, ? blocker and statin     NYHA III GDMT  Diuretic- torsemide 40 mg daily  BB- Coreg 25 mg bid  Ace/ARB/ARNI- no given CKD  MRA Spiro 25 mg daily  SGLT2i- check BMP today if GFR > 20, will start Jardiance     Referred to HFSW (PCP, Medications, Transportation, ETOH Abuse, Drug Abuse, Insurance, Financial ): No  Refer to Pharmacy: Yes  Refer to Home Health:  No Refer to Advanced Heart Failure Clinic: Yes  Refer to General Cardiology: Yes (shared care, Dr. Oval Linsey follows in HTN Clinic)   Follow up: Refer to [facility].   Lyda Jester, PA-C 04/24/21

## 2021-04-22 NOTE — Telephone Encounter (Signed)
Patient notified sleep study has been approved and scheduled for 06/01/21.

## 2021-04-23 ENCOUNTER — Telehealth (HOSPITAL_COMMUNITY): Payer: Self-pay

## 2021-04-23 DIAGNOSIS — I5033 Acute on chronic diastolic (congestive) heart failure: Secondary | ICD-10-CM | POA: Diagnosis not present

## 2021-04-23 DIAGNOSIS — G4730 Sleep apnea, unspecified: Secondary | ICD-10-CM | POA: Diagnosis not present

## 2021-04-23 DIAGNOSIS — G4700 Insomnia, unspecified: Secondary | ICD-10-CM | POA: Diagnosis not present

## 2021-04-23 DIAGNOSIS — E119 Type 2 diabetes mellitus without complications: Secondary | ICD-10-CM | POA: Diagnosis not present

## 2021-04-23 DIAGNOSIS — N1832 Chronic kidney disease, stage 3b: Secondary | ICD-10-CM | POA: Diagnosis not present

## 2021-04-23 DIAGNOSIS — I10 Essential (primary) hypertension: Secondary | ICD-10-CM | POA: Diagnosis not present

## 2021-04-23 NOTE — Telephone Encounter (Signed)
Called to confirm Heart & Vascular Transitions of Care appointment at [facility] 11/10. Patient reminded to bring all medications and pill box organizer with them. Confirmed patient has transportation. Gave directions, instructed to utilize [facility] parking.  Confirmed appointment prior to ending call.   Pricilla Holm, MSN, RN Heart Failure Nurse Navigator [phone]

## 2021-04-24 ENCOUNTER — Other Ambulatory Visit: Payer: Self-pay

## 2021-04-24 ENCOUNTER — Ambulatory Visit (HOSPITAL_COMMUNITY)
Admit: 2021-04-24 | Discharge: 2021-04-24 | Disposition: A | Discharge status: Home / Self Care | Payer: Medicaid Other | Source: Ambulatory Visit | Attending: Adult Health | Admitting: Adult Health

## 2021-04-24 ENCOUNTER — Encounter (HOSPITAL_COMMUNITY): Payer: Self-pay

## 2021-04-24 ENCOUNTER — Telehealth (HOSPITAL_COMMUNITY): Payer: Self-pay

## 2021-04-24 VITALS — BP 160/70 | HR 56 | Wt 231.8 lb

## 2021-04-24 DIAGNOSIS — N184 Chronic kidney disease, stage 4 (severe): Secondary | ICD-10-CM | POA: Diagnosis not present

## 2021-04-24 DIAGNOSIS — Z794 Long term (current) use of insulin: Secondary | ICD-10-CM | POA: Insufficient documentation

## 2021-04-24 DIAGNOSIS — I5032 Chronic diastolic (congestive) heart failure: Secondary | ICD-10-CM

## 2021-04-24 DIAGNOSIS — I130 Hypertensive heart and chronic kidney disease with heart failure and stage 1 through stage 4 chronic kidney disease, or unspecified chronic kidney disease: Secondary | ICD-10-CM | POA: Diagnosis not present

## 2021-04-24 DIAGNOSIS — Z7984 Long term (current) use of oral hypoglycemic drugs: Secondary | ICD-10-CM | POA: Insufficient documentation

## 2021-04-24 DIAGNOSIS — Z8249 Family history of ischemic heart disease and other diseases of the circulatory system: Secondary | ICD-10-CM | POA: Diagnosis not present

## 2021-04-24 DIAGNOSIS — E1122 Type 2 diabetes mellitus with diabetic chronic kidney disease: Secondary | ICD-10-CM | POA: Insufficient documentation

## 2021-04-24 DIAGNOSIS — I2510 Atherosclerotic heart disease of native coronary artery without angina pectoris: Secondary | ICD-10-CM | POA: Diagnosis not present

## 2021-04-24 DIAGNOSIS — Z7982 Long term (current) use of aspirin: Secondary | ICD-10-CM | POA: Insufficient documentation

## 2021-04-24 DIAGNOSIS — I272 Pulmonary hypertension, unspecified: Secondary | ICD-10-CM | POA: Diagnosis not present

## 2021-04-24 LAB — BASIC METABOLIC PANEL
Anion gap: 9 (ref 5–15)
BUN: 52 mg/dL — ABNORMAL HIGH (ref 6–20)
CO2: 25 mmol/L (ref 22–32)
Calcium: 8.8 mg/dL — ABNORMAL LOW (ref 8.9–10.3)
Chloride: 105 mmol/L (ref 98–111)
Creatinine, Ser: 2.32 mg/dL — ABNORMAL HIGH (ref 0.44–1.00)
GFR, Estimated: 24 mL/min — ABNORMAL LOW (ref >60)
Glucose, Bld: 173 mg/dL — ABNORMAL HIGH (ref 70–99)
Potassium: 4.1 mmol/L (ref 3.5–5.1)
Sodium: 139 mmol/L (ref 135–145)

## 2021-04-24 MED ORDER — EMPAGLIFLOZIN 10 MG PO TABS: 10.0000 mg | ORAL_TABLET | Freq: Every day | ORAL | 11 refills | Status: DC

## 2021-04-24 NOTE — Addendum Note (Signed)
Encounter addended by: Shonna Chock, CMA on: 01/72/0910 11:46 AM  Actions taken: Order list changed

## 2021-04-24 NOTE — Patient Instructions (Signed)
EKG done today.  Labs done today. We will contact you only if your labs are abnormal.  Depending on your lab results we may start you on Jardiance.   No other medication changes were made. Please continue all current medications as prescribed.  Your physician recommends that you schedule a follow-up appointment soon  If you have any questions or concerns before your next appointment please send [place] a message through [place] or call our office at [phone].    TO LEAVE A MESSAGE FOR THE NURSE SELECT OPTION 2, PLEASE LEAVE A MESSAGE INCLUDING: YOUR NAME DATE OF BIRTH CALL BACK NUMBER REASON FOR CALL**this is important as we prioritize the call backs  YOU WILL RECEIVE A CALL BACK THE SAME DAY AS LONG AS YOU CALL BEFORE 4:00 PM   Do the following things EVERYDAY: Weigh yourself in the morning before breakfast. Write it down and keep it in a log. Take your medicines as prescribed Eat low salt foods--Limit salt (sodium) to 2000 mg per day.  Stay as active as you can everyday Limit all fluids for the day to less than 2 liters   At the [facility], you and your health needs are our priority. As part of our continuing mission to provide you with exceptional heart care, we have created designated Provider Care Teams. These Care Teams include your primary Cardiologist (physician) and Advanced Practice Providers (APPs- Physician Assistants and Nurse Practitioners) who all work together to provide you with the care you need, when you need it.   You may see any of the following providers on your designated Care Team at your next follow up: Dr Glori Bickers Dr Haynes Kerns, NP Lyda Jester, [place] Audry Riles, PharmD   Please be sure to bring in all your medications bottles to every appointment.

## 2021-04-24 NOTE — Telephone Encounter (Signed)
Patient advised and verbalized understanding. Lab appt scheduled, lab order entered.   Orders Placed This Encounter  Procedures   Basic metabolic panel    Standing Status:   Future    Standing Expiration Date:   04/24/2022    Order Specific Question:   Release to patient    Answer:   Immediate

## 2021-04-24 NOTE — Telephone Encounter (Signed)
-----   Message from Consuelo Pandy, [place] sent at 04/24/2021 11:46 AM EST ----- Labs stable. Start Jardiance 10 mg daily. Repeat BMP in 1 week.

## 2021-04-24 NOTE — Addendum Note (Signed)
Encounter addended by: Consuelo Pandy, PA-C on: 04/24/2021 11:39 AM  Actions taken: Clinical Note Signed

## 2021-04-29 ENCOUNTER — Telehealth: Payer: Self-pay | Admitting: Cardiovascular Disease

## 2021-04-29 ENCOUNTER — Emergency Department (HOSPITAL_COMMUNITY): Payer: Medicaid Other

## 2021-04-29 ENCOUNTER — Telehealth: Payer: Self-pay | Admitting: Cardiology

## 2021-04-29 ENCOUNTER — Other Ambulatory Visit: Payer: Self-pay

## 2021-04-29 ENCOUNTER — Inpatient Hospital Stay (HOSPITAL_COMMUNITY)
Admission: EM | Admit: 2021-04-29 | Discharge: 2021-05-03 | DRG: 683 | Disposition: A | Discharge status: Home / Self Care | Payer: Medicaid Other | Attending: Internal Medicine | Admitting: Internal Medicine

## 2021-04-29 DIAGNOSIS — I951 Orthostatic hypotension: Secondary | ICD-10-CM | POA: Diagnosis present

## 2021-04-29 DIAGNOSIS — R079 Chest pain, unspecified: Secondary | ICD-10-CM | POA: Diagnosis not present

## 2021-04-29 DIAGNOSIS — E119 Type 2 diabetes mellitus without complications: Secondary | ICD-10-CM

## 2021-04-29 DIAGNOSIS — I440 Atrioventricular block, first degree: Secondary | ICD-10-CM | POA: Diagnosis present

## 2021-04-29 DIAGNOSIS — Z8673 Personal history of transient ischemic attack (TIA), and cerebral infarction without residual deficits: Secondary | ICD-10-CM | POA: Diagnosis not present

## 2021-04-29 DIAGNOSIS — D539 Nutritional anemia, unspecified: Secondary | ICD-10-CM | POA: Diagnosis present

## 2021-04-29 DIAGNOSIS — Z885 Allergy status to narcotic agent status: Secondary | ICD-10-CM

## 2021-04-29 DIAGNOSIS — I5032 Chronic diastolic (congestive) heart failure: Secondary | ICD-10-CM | POA: Diagnosis not present

## 2021-04-29 DIAGNOSIS — E785 Hyperlipidemia, unspecified: Secondary | ICD-10-CM | POA: Diagnosis not present

## 2021-04-29 DIAGNOSIS — N179 Acute kidney failure, unspecified: Secondary | ICD-10-CM | POA: Diagnosis not present

## 2021-04-29 DIAGNOSIS — I1A0 Resistant hypertension: Secondary | ICD-10-CM | POA: Diagnosis present

## 2021-04-29 DIAGNOSIS — I10 Essential (primary) hypertension: Secondary | ICD-10-CM | POA: Diagnosis not present

## 2021-04-29 DIAGNOSIS — T502X5A Adverse effect of carbonic-anhydrase inhibitors, benzothiadiazides and other diuretics, initial encounter: Secondary | ICD-10-CM | POA: Diagnosis present

## 2021-04-29 DIAGNOSIS — G4733 Obstructive sleep apnea (adult) (pediatric): Secondary | ICD-10-CM | POA: Diagnosis present

## 2021-04-29 DIAGNOSIS — I2510 Atherosclerotic heart disease of native coronary artery without angina pectoris: Secondary | ICD-10-CM | POA: Diagnosis not present

## 2021-04-29 DIAGNOSIS — Z7984 Long term (current) use of oral hypoglycemic drugs: Secondary | ICD-10-CM

## 2021-04-29 DIAGNOSIS — Z7982 Long term (current) use of aspirin: Secondary | ICD-10-CM | POA: Diagnosis not present

## 2021-04-29 DIAGNOSIS — E669 Obesity, unspecified: Secondary | ICD-10-CM | POA: Diagnosis present

## 2021-04-29 DIAGNOSIS — Z794 Long term (current) use of insulin: Secondary | ICD-10-CM | POA: Diagnosis not present

## 2021-04-29 DIAGNOSIS — Z20822 Contact with and (suspected) exposure to covid-19: Secondary | ICD-10-CM | POA: Diagnosis not present

## 2021-04-29 DIAGNOSIS — Z6839 Body mass index (BMI) 39.0-39.9, adult: Secondary | ICD-10-CM | POA: Diagnosis not present

## 2021-04-29 DIAGNOSIS — R1013 Epigastric pain: Secondary | ICD-10-CM

## 2021-04-29 DIAGNOSIS — Z79899 Other long term (current) drug therapy: Secondary | ICD-10-CM

## 2021-04-29 DIAGNOSIS — K5900 Constipation, unspecified: Secondary | ICD-10-CM | POA: Diagnosis present

## 2021-04-29 DIAGNOSIS — Z8249 Family history of ischemic heart disease and other diseases of the circulatory system: Secondary | ICD-10-CM | POA: Diagnosis not present

## 2021-04-29 DIAGNOSIS — N1832 Chronic kidney disease, stage 3b: Secondary | ICD-10-CM | POA: Diagnosis not present

## 2021-04-29 DIAGNOSIS — I130 Hypertensive heart and chronic kidney disease with heart failure and stage 1 through stage 4 chronic kidney disease, or unspecified chronic kidney disease: Secondary | ICD-10-CM | POA: Diagnosis present

## 2021-04-29 DIAGNOSIS — R55 Syncope and collapse: Secondary | ICD-10-CM

## 2021-04-29 DIAGNOSIS — E1122 Type 2 diabetes mellitus with diabetic chronic kidney disease: Secondary | ICD-10-CM

## 2021-04-29 DIAGNOSIS — R519 Headache, unspecified: Secondary | ICD-10-CM | POA: Diagnosis not present

## 2021-04-29 DIAGNOSIS — Z823 Family history of stroke: Secondary | ICD-10-CM | POA: Diagnosis not present

## 2021-04-29 DIAGNOSIS — R0789 Other chest pain: Secondary | ICD-10-CM | POA: Diagnosis not present

## 2021-04-29 DIAGNOSIS — N184 Chronic kidney disease, stage 4 (severe): Secondary | ICD-10-CM | POA: Diagnosis present

## 2021-04-29 DIAGNOSIS — Z23 Encounter for immunization: Secondary | ICD-10-CM | POA: Diagnosis not present

## 2021-04-29 LAB — URINALYSIS, ROUTINE W REFLEX MICROSCOPIC
Bilirubin Urine: NEGATIVE
Glucose, UA: NEGATIVE mg/dL
Hgb urine dipstick: NEGATIVE
Ketones, ur: NEGATIVE mg/dL
Nitrite: NEGATIVE
Protein, ur: 30 mg/dL — AB
Specific Gravity, Urine: 1.009 (ref 1.005–1.030)
pH: 5 (ref 5.0–8.0)

## 2021-04-29 LAB — DIFFERENTIAL
Abs Immature Granulocytes: 0.01 10*3/uL (ref 0.00–0.07)
Basophils Absolute: 0.1 10*3/uL (ref 0.0–0.1)
Basophils Relative: 1 %
Eosinophils Absolute: 0.3 10*3/uL (ref 0.0–0.5)
Eosinophils Relative: 5 %
Immature Granulocytes: 0 %
Lymphocytes Relative: 24 %
Lymphs Abs: 1.2 10*3/uL (ref 0.7–4.0)
Monocytes Absolute: 0.5 10*3/uL (ref 0.1–1.0)
Monocytes Relative: 11 %
Neutro Abs: 2.8 10*3/uL (ref 1.7–7.7)
Neutrophils Relative %: 59 %

## 2021-04-29 LAB — CBC
HCT: 33.6 % — ABNORMAL LOW (ref 36.0–46.0)
HCT: 34.7 % — ABNORMAL LOW (ref 36.0–46.0)
Hemoglobin: 10.8 g/dL — ABNORMAL LOW (ref 12.0–15.0)
Hemoglobin: 11.2 g/dL — ABNORMAL LOW (ref 12.0–15.0)
MCH: 26.6 pg (ref 26.0–34.0)
MCH: 27.1 pg (ref 26.0–34.0)
MCHC: 32.1 g/dL (ref 30.0–36.0)
MCHC: 32.3 g/dL (ref 30.0–36.0)
MCV: 82.8 fL (ref 80.0–100.0)
MCV: 83.8 fL (ref 80.0–100.0)
Platelets: 242 10*3/uL (ref 150–400)
Platelets: 222 10*3/uL (ref 150–400)
RBC: 4.06 MIL/uL (ref 3.87–5.11)
RBC: 4.14 MIL/uL (ref 3.87–5.11)
RDW: 17.2 % — ABNORMAL HIGH (ref 11.5–15.5)
RDW: 17.3 % — ABNORMAL HIGH (ref 11.5–15.5)
WBC: 5.4 10*3/uL (ref 4.0–10.5)
WBC: 4.9 10*3/uL (ref 4.0–10.5)
nRBC: 0 % (ref 0.0–0.2)
nRBC: 0 % (ref 0.0–0.2)

## 2021-04-29 LAB — HEPATIC FUNCTION PANEL
ALT: 9 U/L (ref 0–44)
AST: 13 U/L — ABNORMAL LOW (ref 15–41)
Albumin: 3.1 g/dL — ABNORMAL LOW (ref 3.5–5.0)
Alkaline Phosphatase: 44 U/L (ref 38–126)
Bilirubin, Direct: <0.1 mg/dL (ref 0.0–0.2)
Total Bilirubin: 0.6 mg/dL (ref 0.3–1.2)
Total Protein: 6.1 g/dL — ABNORMAL LOW (ref 6.5–8.1)

## 2021-04-29 LAB — HEMOGLOBIN A1C
Hgb A1c MFr Bld: 6.7 % — ABNORMAL HIGH (ref 4.8–5.6)
Mean Plasma Glucose: 145.59 mg/dL

## 2021-04-29 LAB — BASIC METABOLIC PANEL
Anion gap: 14 (ref 5–15)
BUN: 86 mg/dL — ABNORMAL HIGH (ref 6–20)
CO2: 23 mmol/L (ref 22–32)
Calcium: 9 mg/dL (ref 8.9–10.3)
Chloride: 99 mmol/L (ref 98–111)
Creatinine, Ser: 3.84 mg/dL — ABNORMAL HIGH (ref 0.44–1.00)
GFR, Estimated: 13 mL/min — ABNORMAL LOW (ref >60)
Glucose, Bld: 171 mg/dL — ABNORMAL HIGH (ref 70–99)
Potassium: 3.9 mmol/L (ref 3.5–5.1)
Sodium: 136 mmol/L (ref 135–145)

## 2021-04-29 LAB — RESP PANEL BY RT-PCR (FLU A&B, COVID) ARPGX2
Influenza A by PCR: NEGATIVE
Influenza B by PCR: NEGATIVE
SARS Coronavirus 2 by RT PCR: NEGATIVE

## 2021-04-29 LAB — PHOSPHORUS: Phosphorus: 5 mg/dL — ABNORMAL HIGH (ref 2.5–4.6)

## 2021-04-29 LAB — CK: Total CK: 68 U/L (ref 38–234)

## 2021-04-29 LAB — I-STAT BETA HCG BLOOD, ED (MC, WL, AP ONLY): I-stat hCG, quantitative: <5 m[IU]/mL (ref <5)

## 2021-04-29 LAB — MAGNESIUM: Magnesium: 2.2 mg/dL (ref 1.7–2.4)

## 2021-04-29 LAB — TROPONIN I (HIGH SENSITIVITY)
Troponin I (High Sensitivity): 16 ng/L (ref <18)
Troponin I (High Sensitivity): 16 ng/L (ref <18)

## 2021-04-29 LAB — PREALBUMIN: Prealbumin: 34.4 mg/dL (ref 18–38)

## 2021-04-29 MED ORDER — PANTOPRAZOLE SODIUM 40 MG PO TBEC
40.0000 mg | DELAYED_RELEASE_TABLET | Freq: Every day | ORAL | Status: DC
  Administered 2021-04-30 – 2021-05-03 (×4): 40 mg via ORAL
  Filled 2021-04-29 (×4): qty 1

## 2021-04-29 MED ORDER — METHOCARBAMOL 1000 MG/10ML IJ SOLN
500.0000 mg | Freq: Four times a day (QID) | INTRAVENOUS | Status: DC | PRN
  Administered 2021-05-02: 500 mg via INTRAVENOUS
  Filled 2021-04-29: qty 500

## 2021-04-29 MED ORDER — ACETAMINOPHEN 325 MG PO TABS
650.0000 mg | ORAL_TABLET | Freq: Four times a day (QID) | ORAL | Status: DC | PRN
  Administered 2021-04-30 – 2021-05-02 (×3): 650 mg via ORAL
  Filled 2021-04-29 (×3): qty 2

## 2021-04-29 MED ORDER — ATORVASTATIN CALCIUM 10 MG PO TABS
20.0000 mg | ORAL_TABLET | Freq: Every evening | ORAL | Status: DC
  Administered 2021-04-30 – 2021-05-02 (×3): 20 mg via ORAL
  Filled 2021-04-29 (×3): qty 2

## 2021-04-29 MED ORDER — SERTRALINE HCL 50 MG PO TABS
50.0000 mg | ORAL_TABLET | Freq: Every day | ORAL | Status: DC
  Administered 2021-04-30 – 2021-05-03 (×4): 50 mg via ORAL
  Filled 2021-04-29 (×4): qty 1

## 2021-04-29 MED ORDER — HEPARIN SODIUM (PORCINE) 5000 UNIT/ML IJ SOLN
5000.0000 [IU] | Freq: Three times a day (TID) | INTRAMUSCULAR | Status: DC
  Administered 2021-04-30 – 2021-05-03 (×12): 5000 [IU] via SUBCUTANEOUS
  Filled 2021-04-29 (×12): qty 1

## 2021-04-29 MED ORDER — ASPIRIN EC 81 MG PO TBEC
81.0000 mg | DELAYED_RELEASE_TABLET | Freq: Every day | ORAL | Status: DC
  Administered 2021-04-30 – 2021-05-03 (×4): 81 mg via ORAL
  Filled 2021-04-29 (×4): qty 1

## 2021-04-29 MED ORDER — INSULIN ASPART 100 UNIT/ML IJ SOLN
0.0000 [IU] | INTRAMUSCULAR | Status: DC
Start: 2021-04-30 — End: 2021-05-01
  Administered 2021-04-30 (×2): 1 [IU] via SUBCUTANEOUS
  Administered 2021-05-01: 2 [IU] via SUBCUTANEOUS
  Administered 2021-05-01: 07:00:00 1 [IU] via SUBCUTANEOUS

## 2021-04-29 MED ORDER — INSULIN GLARGINE-YFGN 100 UNIT/ML ~~LOC~~ SOLN: 15.0000 [IU] | Freq: Every evening | SUBCUTANEOUS | Status: DC | PRN

## 2021-04-29 MED ORDER — CARVEDILOL 25 MG PO TABS
25.0000 mg | ORAL_TABLET | Freq: Two times a day (BID) | ORAL | Status: DC
  Administered 2021-04-30 – 2021-05-03 (×6): 25 mg via ORAL
  Filled 2021-04-29: qty 2
  Filled 2021-04-29 (×4): qty 1
  Filled 2021-04-29: qty 2
  Filled 2021-04-29: qty 1

## 2021-04-29 MED ORDER — SODIUM CHLORIDE 0.9 % IV SOLN: INTRAVENOUS | Status: AC

## 2021-04-29 MED ORDER — SODIUM CHLORIDE 0.9 % IV BOLUS
500.0000 mL | Freq: Once | INTRAVENOUS | Status: AC
  Administered 2021-04-29: 500 mL via INTRAVENOUS

## 2021-04-29 MED ORDER — ACETAMINOPHEN 650 MG RE SUPP: 650.0000 mg | Freq: Four times a day (QID) | RECTAL | Status: DC | PRN

## 2021-04-29 NOTE — Telephone Encounter (Signed)
Received a call from patient she stated she has been having chest pain off and on for the past 4 days.She also has been having a " weird feeling" when she stands up feels faint off and on for the past 4 days.She is presently having chest pain rates pain # 6.Advised to go to ED to be evaluated.

## 2021-04-29 NOTE — Telephone Encounter (Signed)
Pt c/o of Chest Pain: STAT if CP now or developed within 24 hours  1. Are you having CP right now? Yes   2. Are you experiencing any other symptoms (ex. SOB, nausea, vomiting, sweating)? Ok while she is sitting down, nausea and sob when standing   3. How long have you been experiencing CP? Its intensified over the last 4 days   4. Is your CP continuous or coming and going? Continuous   5. Have you taken Nitroglycerin? No  ?

## 2021-04-29 NOTE — ED Provider Notes (Signed)
[facility] EMERGENCY DEPARTMENT Provider Note   CSN: [id number] Arrival date & time: 04/29/21  1243     History Chief Complaint  Patient presents with   Chest Pain   Dizziness   Weakness    Maria Hudson is a 60 y.o. female.  The history is provided by the patient and medical records. No language interpreter was used.  Chest Pain Pain location:  Substernal area Pain quality: crushing and pressure   Pain radiates to:  Does not radiate Pain severity:  Severe Onset quality:  Gradual Duration:  4 days Timing:  Intermittent Progression:  Waxing and waning Chronicity:  New Relieved by:  Nothing Worsened by:  Nothing Ineffective treatments:  None tried Associated symptoms: dizziness, fatigue, headache, near-syncope, shortness of breath and weakness   Associated symptoms: no abdominal pain, no altered mental status, no back pain, no cough, no diaphoresis, no fever, no lower extremity edema, no nausea, no numbness, no palpitations and no vomiting   Dizziness Associated symptoms: chest pain, headaches, shortness of breath and weakness   Associated symptoms: no diarrhea, no nausea, no palpitations and no vomiting   Weakness Associated symptoms: chest pain, dizziness, headaches, near-syncope and shortness of breath   Associated symptoms: no abdominal pain, no cough, no diarrhea, no dysuria, no fever, no nausea and no vomiting       Past Medical History:  Diagnosis Date   Acute on chronic diastolic heart failure (HCC) 03/05/2021   CHF (congestive heart failure) (HCC)    CKD (chronic kidney disease) stage 4, GFR 15-29 ml/min (HCC) 03/05/2021   Diabetes mellitus without complication ([place])    Hypertension    Hypertensive urgency 04/14/2021   Renal disorder    Stroke [facility])     Patient Active Problem List   Diagnosis Date Noted   Hypertensive urgency 04/14/2021   Acute on chronic diastolic CHF (congestive heart failure) ([place]) 04/14/2021   Chest pain  03/05/2021   CAD (coronary artery disease) 03/05/2021   T2DM (type 2 diabetes mellitus) ([place]) 03/05/2021   CKD (chronic kidney disease) stage 4, GFR 15-29 ml/min (HCC) 03/05/2021   Class 3 obesity ([place]) 03/05/2021   CVA (cerebral vascular accident) ([place]) 03/05/2021   Acute on chronic diastolic heart failure ([place]) 03/05/2021   Essential hypertension 03/05/2021   AKI (acute kidney injury) ([place]) 03/05/2021    No past surgical history on file.   OB History   No obstetric history on file.     Family History  Problem Relation Age of Onset   Hypertension Mother    Stroke Mother    Hypertension Father    Hypertension Sister    Hypertension Son    Hypertension Daughter     Social History   Tobacco Use   Smoking status: Never    Passive exposure: Never   Smokeless tobacco: Never  Vaping Use   Vaping Use: Never used  Substance Use Topics   Alcohol use: Not Currently   Drug use: Not Currently    Home Medications Prior to Admission medications   Medication Sig Start Date End Date Taking? Authorizing Provider  allopurinol (ZYLOPRIM) 100 MG tablet Take 100 mg by mouth daily. 01/27/21   [provider]  amLODipine (NORVASC) 10 MG tablet Take 10 mg by mouth every morning. 02/03/21   [provider]  aspirin EC 81 MG tablet Take 81 mg by mouth daily. Swallow whole.    [provider]  atorvastatin (LIPITOR) 20 MG tablet Take 20 mg by mouth  every evening. 03/02/21   [provider]  buPROPion (WELLBUTRIN) 100 MG tablet Take 100 mg by mouth 2 (two) times daily. 02/04/21   [provider]  carvedilol (COREG) 25 MG tablet Take 1 tablet (25 mg total) by mouth 2 (two) times daily with a meal. 03/07/21 04/24/21  [name], [name], MD  chlorthalidone (HYGROTON) 50 MG tablet Take 1 tablet (50 mg total) by mouth daily. 04/21/21   [name], DO  cloNIDine (CATAPRES) 0.2 MG tablet Take 0.4 mg by mouth 3 (three) times daily.    [provider]  doxazosin (CARDURA) 8 MG tablet Take 1 tablet (8 mg total) by mouth every evening. 04/20/21   [name], DO  empagliflozin (JARDIANCE) 10 MG TABS tablet Take 1 tablet (10 mg total) by mouth daily before breakfast. 04/24/21   Lyda Jester M, PA-C  ferrous sulfate 325 (65 FE) MG tablet Take 325 mg by mouth 2 (two) times daily. 02/24/21   [provider]  fluticasone (FLONASE) 50 MCG/ACT nasal spray Place 1 spray into both nostrils daily as needed for allergies or rhinitis.    [provider]  hydrALAZINE (APRESOLINE) 100 MG tablet Take 1 tablet (100 mg total) by mouth 3 (three) times daily. 03/12/21   Skeet Latch, MD  insulin glargine (LANTUS) 100 unit/mL SOPN Inject 20 Units into the skin at bedtime.    [provider]  isosorbide mononitrate (IMDUR) 120 MG 24 hr tablet Take 1 tablet (120 mg total) by mouth daily. 04/21/21   [name], DO  loratadine (CLARITIN) 10 MG tablet Take 10 mg by mouth daily. 02/14/21   [provider]  Multiple Vitamin (MULTIVITAMIN ADULT) TABS Take 1 tablet by mouth daily.    [provider]  nitroGLYCERIN (NITROSTAT) 0.4 MG SL tablet Place 1 tablet (0.4 mg total) under the tongue every 5 (five) minutes as needed for chest pain. 03/07/21   [name], [name], MD  NOVOLOG FLEXPEN 100 UNIT/ML FlexPen Inject 8 Units into the skin 3 (three) times daily with meals. 01/12/21   [provider]  Omega-3 Fatty Acids (FISH OIL) 1000 MG CAPS Take 1 capsule by mouth daily.    [provider]  pantoprazole (PROTONIX) 40 MG tablet Take 1 tablet (40 mg total) by mouth daily. 04/20/21   [name], DO  polyethylene glycol (MIRALAX / GLYCOLAX) 17 g packet Take 17 g by mouth daily. 03/07/21   [name], [name], MD  pregabalin (LYRICA) 25 MG capsule Take 25 mg by mouth 2 (two) times daily. 02/03/21   [provider]  senna (SENOKOT) 8.6 MG TABS tablet Take 2 tablets (17.2 mg  total) by mouth daily. 04/21/21   [name], DO  sertraline (ZOLOFT) 50 MG tablet Take 50 mg by mouth daily. 02/01/21   [provider]  spironolactone (ALDACTONE) 25 MG tablet Take 1 tablet (25 mg total) by mouth daily. 04/21/21   [name], DO  torsemide 40 MG TABS Take 40 mg by mouth daily. 04/21/21   [name], DO  traZODone (DESYREL) 50 MG tablet Take 50 mg by mouth at bedtime. 04/23/21   [provider]    Allergies    Hydrocodone and Oxycodone-acetaminophen  Review of Systems   Review of Systems  Constitutional:  Positive for fatigue. Negative for chills, diaphoresis and fever.  HENT:  Negative for congestion.   Eyes:  Negative for visual disturbance.  Respiratory:  Positive for shortness of breath.  Negative for cough, chest tightness and wheezing.   Cardiovascular:  Positive for chest pain and near-syncope. Negative for palpitations and leg swelling.  Gastrointestinal:  Negative for abdominal pain, constipation, diarrhea, nausea and vomiting.  Genitourinary:  Negative for dysuria and flank pain.  Musculoskeletal:  Negative for back pain and neck pain.  Skin:  Negative for rash and wound.  Neurological:  Positive for dizziness, weakness and headaches. Negative for speech difficulty and numbness.  Psychiatric/Behavioral:  Negative for agitation and confusion.   All other systems reviewed and are negative.  Physical Exam Updated Vital Signs BP (!) 155/72 (BP Location: Left Arm)   Pulse (!) 54   Temp 98.1 F (36.7 C) (Oral)   Resp 17   Ht 5\' 3"  (1.6 m)   Wt 100.7 kg   SpO2 98%   BMI 39.33 kg/m   Physical Exam Vitals and nursing note reviewed.  Constitutional:      General: She is not in acute distress.    Appearance: She is well-developed. She is not ill-appearing or toxic-appearing.  HENT:     Head: Normocephalic and atraumatic.  Eyes:     Extraocular Movements: Extraocular movements intact.     Conjunctiva/sclera: Conjunctivae  normal.     Pupils: Pupils are equal, round, and reactive to light.  Cardiovascular:     Rate and Rhythm: Regular rhythm. Bradycardia present.     Heart sounds: Murmur heard.  Pulmonary:     Effort: Pulmonary effort is normal. No respiratory distress.     Breath sounds: Normal breath sounds. No decreased breath sounds, wheezing, rhonchi or rales.  Abdominal:     Palpations: Abdomen is soft.     Tenderness: There is no abdominal tenderness.  Musculoskeletal:     Cervical back: Neck supple.     Right lower leg: No edema.     Left lower leg: No edema.  Skin:    General: Skin is warm and dry.     Capillary Refill: Capillary refill takes less than 2 seconds.     Findings: No erythema.  Neurological:     General: No focal deficit present.     Mental Status: She is alert.    ED Results / Procedures / Treatments   Labs (all labs ordered are listed, but only abnormal results are displayed) Labs Reviewed  BASIC METABOLIC PANEL - Abnormal; Notable for the following components:      Result Value   Glucose, Bld 171 (*)    BUN 86 (*)    Creatinine, Ser 3.84 (*)    GFR, Estimated 13 (*)    All other components within normal limits  CBC - Abnormal; Notable for the following components:   Hemoglobin 10.8 (*)    HCT 33.6 (*)    RDW 17.2 (*)    All other components within normal limits  URINALYSIS, ROUTINE W REFLEX MICROSCOPIC - Abnormal; Notable for the following components:   APPearance HAZY (*)    Protein, ur 30 (*)    Leukocytes,Ua LARGE (*)    Bacteria, UA FEW (*)    All other components within normal limits  RESP PANEL BY RT-PCR (FLU A&B, COVID) [id number]  URINE CULTURE  HEPATIC FUNCTION PANEL  CBG MONITORING, ED  I-STAT BETA HCG BLOOD, ED ([facility], [facility], [facility] ONLY)  TROPONIN I (HIGH SENSITIVITY)  TROPONIN I (HIGH SENSITIVITY)    EKG EKG Interpretation  Date/Time:  Tuesday April 29 2021 12:59:06 EST Ventricular Rate:  59 PR Interval:  210 QRS Duration: 92  QT  Interval:  442 QTC Calculation: 437 R Axis:   0 Text Interpretation: Sinus bradycardia with 1st degree A-V block with Premature atrial complexes in a pattern of bigeminy Minimal voltage criteria for LVH, may be normal variant ( Cornell product ) Borderline ECG No significant change since prior 11/22 Confirmed by Aletta Edouard [phone]) on 04/29/2021 1:01:21 PM  Radiology DG Chest 2 View  Result Date: 04/29/2021 CLINICAL DATA:  generalized chest pain EXAM: CHEST - 2 VIEW COMPARISON:  04/14/2021 FINDINGS: Normal mediastinum and cardiac silhouette. Normal pulmonary vasculature. No evidence of effusion, infiltrate, or pneumothorax. No acute bony abnormality. IMPRESSION: No acute cardiopulmonary process. Electronically Signed   By: Suzy Bouchard M.D.   On: 04/29/2021 19:31   CT HEAD WO CONTRAST (5MM)  Result Date: 04/29/2021 CLINICAL DATA:  Headache EXAM: CT HEAD WITHOUT CONTRAST TECHNIQUE: Contiguous axial images were obtained from the base of the skull through the vertex without intravenous contrast. COMPARISON:  None. FINDINGS: Brain: There is no mass, hemorrhage or extra-axial collection. The size and configuration of the ventricles and extra-axial CSF spaces are normal. There is hypoattenuation of the white matter, most commonly indicating chronic small vessel disease. Vascular: Atherosclerotic calcification of the vertebral and internal carotid arteries at the skull base. No abnormal hyperdensity of the major intracranial arteries or dural venous sinuses. Skull: Remote right temporal craniectomy Sinuses/Orbits: No fluid levels or advanced mucosal thickening of the visualized paranasal sinuses. No mastoid or middle ear effusion. The orbits are normal. IMPRESSION: 1. Chronic small vessel disease without acute intracranial abnormality. 2. Remote right temporal craniectomy. Electronically Signed   By: Ulyses Jarred M.D.   On: 04/29/2021 19:51    Procedures Procedures   Medications Ordered in  ED Medications  sodium chloride 0.9 % bolus 500 mL (0 mLs Intravenous Stopped 04/29/21 2103)    ED Course  I have reviewed the triage vital signs and the nursing notes.  Pertinent labs & imaging results that were available during my care of the patient were reviewed by me and considered in my medical decision making (see chart for details).    MDM Rules/Calculators/A&P                           Maria Hudson is a 60 y.o. female with a past medical history significant for previous MI, hypertension, CKD, prior stroke, and CHF with recent increase in her diuretics who presents with severe lightheadedness, near syncope, headaches, and chest pain.  Patient reports that for the last 4 days, she has had worsening lightheadedness and near syncopal anytime she stands up and tries to walk around.  She also reports severe headache and chest pain that is an aching and pressure.  She is unsure if this feels like her previous MI.  She reports no fevers, chills, congestion, or cough.  She does report shortness of breath with the discomfort.  She denies any new leg pain or leg swelling and reports her legs are doing very well in regards to edema.  She denies any abdominal pain, flank pain, or back pain.  On exam, chest is nontender and I do hear slight murmur.  Abdomen is nontender.  Lungs were clear.  I did not appreciate focal deficits initially with normal sensation and strength in extremities and symmetric smile.  Clear speech.  Pupils symmetric and reactive normal extraocular movements.  Mouth is dry on exam.  EKG does not show STEMI.  Patient had work-up starting in  triage had some basic labs.  Her creatinine has jumped precipitously and just several days from 2.3 up to 3.8.  This is likely in part to her increase in diuretic was torsemide use to help with fluid overload.  Otherwise her troponin was negative.  CBC does not show any leukocytosis and has mild anemia improved from prior.  Urinalysis does  not show any nitrites and given her lack of dysuria, will do not suspect UTI at this time.  Due to the chest pain and shortness of breath, will get a chest x-ray and due to the severe headache and near syncope with prior stroke and labile blood pressures, will order CT head as well.  Given patient's acute kidney injury, near syncope, history of MI with crushing chest pain, I do feel she will likely need admission.  We will start some fluids and check her for COVID and flu.  When work-up is completed, anticipate admission.  COVID and flu negative.  Patient will be admitted.   Final Clinical Impression(s) / ED Diagnoses Final diagnoses:  Near syncope  Chest pain, unspecified type  AKI (acute kidney injury) ([place])    Clinical Impression: 1. Near syncope   2. Chest pain, unspecified type   3. AKI (acute kidney injury) ([place])     Disposition: Admit  This note was prepared with assistance of Dragon voice recognition software. Occasional wrong-word or sound-a-like substitutions may have occurred due to the inherent limitations of voice recognition software.      Kennedy Brines, Gwenyth Allegra, MD 04/29/21 2203

## 2021-04-29 NOTE — Telephone Encounter (Signed)
New Message:    [facility] would like for you to fax patient's last Lipid results to [phone] please.

## 2021-04-29 NOTE — H&P (Signed)
Bellarae Lizer ZOX:[id number] DOB: 1961-03-31 DOA: 04/29/2021     PCP: Nolene Ebbs, MD   Outpatient Specialists:  CARDS:  Dr. Oval Linsey    Patient arrived to ER on 04/29/21 at 1243 Referred by Attending Tegeler, Gwenyth Allegra, *   Patient coming from: home Lives  With family    Chief Complaint:   Chief Complaint  Patient presents with   Chest Pain   Dizziness   Weakness    HPI: Maria Hudson is a 60 y.o. female with medical history significant of diastolic CHF, CKD, DM2, HTN,CVA  OSA not on CPAP and pulmonary HTN.    Presented with   CP worse over past 4 days, headache,  Recently started on torsemide Reports she has been having worsening chest discomfort for the past 6 days worse with palpation on the left and upper part of her chest as well as left shoulder.  Patient denied doing any activities she sleeps in a recliner.  Chest pain is not worse with coughing or deep breathing.  It is constant nothing seems to make it better. Reports that her leg edema has improved She has lost almost 30 pounds after her medications has been switched Recently was admitted for CHF exacerbation that she was taken off of Bumex and put on torsemide instead.  With excellent results regarding her swelling. But now she started to feel lightheaded every time she stands up. Patient was discharged on norvasc 10mg  daily, coreg 25mg  BID, clonidine 0.4mg  tid, doxazosin 8mg  daily, hydralazine 100mg  TID, imdur 120mg  daily, torsemide 40 daily, spironolactone 25mg  daily -Increase chlorthalidone to 50 mg daily norvasc 10mg  daily, coreg 25mg  BID, clonidine 0.4mg  tid, doxazosin 8mg  daily, hydralazine 100mg  TID, imdur 120mg  daily, torsemide 40 daily, spironolactone 25mg  daily  Increase chlorthalidone to 50 mg daily  Now noted to be orthostatic. Has  been vaccinated against COVID and boosted   Initial COVID TEST  NEGATIVE   Lab Results  Component Value Date   SARSCOV2NAA NEGATIVE 04/29/2021    [place] NEGATIVE 04/14/2021   [place] NEGATIVE 03/05/2021     Regarding pertinent Chronic problems:     Hyperlipidemia -  on statins Lipitor Lipid Panel      HTN on Norvasc, Coreg, Catapress, cardura, imdur,    chronic CHF diastolic  - last echo 81/1914 EF 55 to 60%. Grade I diastolic dysfunction  Spironolactone, torsemide       DM 2 -  Lab Results  Component Value Date   HGBA1C 6.7 (H) 04/15/2021   on insulin,  lantus     obesity-   BMI Readings from Last 1 Encounters:  04/29/21 39.33 kg/m       CKD stage IIIb- baseline Cr2.3 Estimated Creatinine Clearance: 17.9 mL/min (A) (by C-G formula based on SCr of 3.84 mg/dL (H)).  Lab Results  Component Value Date   CREATININE 3.84 (H) 04/29/2021   CREATININE 2.32 (H) 04/24/2021   CREATININE 2.39 (H) 04/20/2021        Chronic anemia - baseline hg Hemoglobin & Hematocrit  Recent Labs    04/15/21 0412 04/16/21 0352 04/29/21 1323  HGB 10.1* 10.0* 10.8*     While in ER: CT head anl, CXR neg Nearsincope when stands     ED Triage Vitals  Enc Vitals Group     BP 04/29/21 1257 (!) 154/66     Pulse Rate 04/29/21 1257 (!) 55     Resp 04/29/21 1257 16     Temp 04/29/21 1257 98.1 F (36.7 C)  Temp Source 04/29/21 1257 Oral     SpO2 04/29/21 1257 99 %     Weight 04/29/21 1820 222 lb (100.7 kg)     Height 04/29/21 1820 5\' 3"  (1.6 m)     Head Circumference --      Peak Flow --      Pain Score 04/29/21 1308 9     Pain Loc --      Pain Edu? --      Excl. in [place]? --   TMAX(24)@     _________________________________________ Significant initial  Findings: Abnormal Labs Reviewed  BASIC METABOLIC PANEL - Abnormal; Notable for the following components:      Result Value   Glucose, Bld 171 (*)    BUN 86 (*)    Creatinine, Ser 3.84 (*)    GFR, Estimated 13 (*)    All other components within normal limits  CBC - Abnormal; Notable for the following components:   Hemoglobin 10.8 (*)    HCT 33.6 (*)     RDW 17.2 (*)    All other components within normal limits  URINALYSIS, ROUTINE W REFLEX MICROSCOPIC - Abnormal; Notable for the following components:   APPearance HAZY (*)    Protein, ur 30 (*)    Leukocytes,Ua LARGE (*)    Bacteria, UA FEW (*)    All other components within normal limits   ____________________________________________ Ordered CT HEAD . Chronic small vessel disease without acute intracranial abnormality.  CXR - No acute cardiopulmonary process      _________________________ Troponin 16 16 ECG: Ordered Personally reviewed by me showing: HR :59 Rhythm: SB with 1st degree AV block  no evidence of ischemic changes QTC 437    The recent clinical data is shown below. Vitals:   04/29/21 1257 04/29/21 1616 04/29/21 1820  BP: (!) 154/66 132/66 (!) 155/72  Pulse: (!) 55 (!) 54 (!) 54  Resp: 16 16 17   Temp: 98.1 F (36.7 C) 97.8 F (36.6 C) 98.1 F (36.7 C)  TempSrc: Oral Oral Oral  SpO2: 99% 99% 98%  Weight:   100.7 kg  Height:   5\' 3"  (1.6 m)    WBC     Component Value Date/Time   WBC 5.4 04/29/2021 1323   LYMPHSABS 0.9 03/05/2021 1500   MONOABS 0.5 03/05/2021 1500   EOSABS 0.2 03/05/2021 1500   BASOSABS 0.1 03/05/2021 1500      UA Pyuria   Urine analysis:    Component Value Date/Time   COLORURINE YELLOW 04/29/2021 1315   APPEARANCEUR HAZY (A) 04/29/2021 1315   LABSPEC 1.009 04/29/2021 1315   PHURINE 5.0 04/29/2021 [place] 04/29/2021 1315   [place] 04/29/2021 [place] 04/29/2021 [place] 04/29/2021 1315   PROTEINUR 30 (A) 04/29/2021 1315   NITRITE NEGATIVE 04/29/2021 1315   LEUKOCYTESUR LARGE (A) 04/29/2021 1315    Results for orders placed or performed during the hospital encounter of 04/29/21  Resp Panel by RT-PCR (Flu A&B, Covid) Nasopharyngeal Swab     Status: None   Collection Time: 04/29/21  6:51 PM   Specimen: Nasopharyngeal Swab; Nasopharyngeal(NP) swabs in vial  transport medium  Result Value Ref Range Status   SARS Coronavirus 2 by RT PCR NEGATIVE NEGATIVE Final         Influenza A by PCR NEGATIVE NEGATIVE Final   Influenza B by PCR NEGATIVE NEGATIVE Final             _______________________________________________  Hospitalist was called for admission for overdiuresis with AKI  The following Work up has been ordered so far:  Orders Placed This Encounter  Procedures   Resp Panel by RT-PCR (Flu A&B, Covid) Nasopharyngeal Swab   DG Chest 2 View   CT HEAD WO CONTRAST (5MM)   Basic metabolic panel   CBC   Urinalysis, Routine w reflex microscopic   Hepatic function panel   Document Height and Actual Weight   Cardiac monitoring   Consult to hospitalist   CBG monitoring, ED   I-Stat beta hCG blood, ED   EKG 12-Lead   ED EKG    Following Medications were ordered in ER: Medications  sodium chloride 0.9 % bolus 500 mL (0 mLs Intravenous Stopped 04/29/21 2103)        Consult Orders  (From admission, onward)           Start     Ordered   04/29/21 2125  Consult to hospitalist  Paged [facility] by Lavone Orn  Once       Provider:  (Not yet assigned)  Question Answer Comment  Place call to: [facility]   Reason for Consult Admit      04/29/21 2128              OTHER Significant initial  Findings:  labs showing:    Recent Labs  Lab 04/24/21 1020 04/29/21 1323  NA 139 136  K 4.1 3.9  CO2 25 23  GLUCOSE 173* 171*  BUN 52* 86*  CREATININE 2.32* 3.84*  CALCIUM 8.8* 9.0    Cr   Up from baseline see below Lab Results  Component Value Date   CREATININE 3.84 (H) 04/29/2021   CREATININE 2.32 (H) 04/24/2021   CREATININE 2.39 (H) 04/20/2021    No results for input(s): AST, ALT, ALKPHOS, BILITOT, PROT, ALBUMIN in the last 168 hours. Lab Results  Component Value Date   CALCIUM 9.0 04/29/2021   PHOS 3.7 04/20/2021          Plt: Lab Results  Component Value Date   PLT 242 04/29/2021       COVID-19 Labs  No  results for input(s): DDIMER, FERRITIN, LDH, CRP in the last 72 hours.  Lab Results  Component Value Date   SARSCOV2NAA NEGATIVE 04/29/2021   SARSCOV2NAA NEGATIVE 04/14/2021   [place] NEGATIVE 03/05/2021         Recent Labs  Lab 04/29/21 1323  WBC 5.4  HGB 10.8*  HCT 33.6*  MCV 82.8  PLT 242    HG/HCT * stable,  Down *Up from baseline see below    Component Value Date/Time   HGB 10.8 (L) 04/29/2021 1323   HCT 33.6 (L) 04/29/2021 1323   MCV 82.8 04/29/2021 1323      No results for input(s): LIPASE, AMYLASE in the last 168 hours. No results for input(s): AMMONIA in the last 168 hours.   Cardiac Panel (last 3 results) No results for input(s): CKTOTAL, CKMB, TROPONINI, RELINDX in the last 72 hours.   BNP (last 3 results) Recent Labs    03/04/21 1132 04/14/21 1219  BNP 66.2 97.1      DM  labs:  HbA1C: Recent Labs    04/14/21 2045 04/15/21 0412  HGBA1C 6.7* 6.7*       CBG (last 3)  No results for input(s): GLUCAP in the last 72 hours.        Cultures:    Component Value Date/Time   SDES URINE, CLEAN CATCH 03/04/2021  2147   [facility]  03/04/2021 2147    NONE Performed at [facility] [address] [place], [place] [address]    CULT >=100,000 COLONIES/mL ENTEROCOCCUS FAECALIS (A) 03/04/2021 2147   REPTSTATUS 03/07/2021 FINAL 03/04/2021 2147     Radiological Exams on Admission: DG Chest 2 View  Result Date: 04/29/2021 CLINICAL DATA:  generalized chest pain EXAM: CHEST - 2 VIEW COMPARISON:  04/14/2021 FINDINGS: Normal mediastinum and cardiac silhouette. Normal pulmonary vasculature. No evidence of effusion, infiltrate, or pneumothorax. No acute bony abnormality. IMPRESSION: No acute cardiopulmonary process. Electronically Signed   By: Suzy Bouchard M.D.   On: 04/29/2021 19:31   CT HEAD WO CONTRAST (5MM)  Result Date: 04/29/2021 CLINICAL DATA:  Headache EXAM: CT HEAD WITHOUT CONTRAST TECHNIQUE: Contiguous axial images were  obtained from the base of the skull through the vertex without intravenous contrast. COMPARISON:  None. FINDINGS: Brain: There is no mass, hemorrhage or extra-axial collection. The size and configuration of the ventricles and extra-axial CSF spaces are normal. There is hypoattenuation of the white matter, most commonly indicating chronic small vessel disease. Vascular: Atherosclerotic calcification of the vertebral and internal carotid arteries at the skull base. No abnormal hyperdensity of the major intracranial arteries or dural venous sinuses. Skull: Remote right temporal craniectomy Sinuses/Orbits: No fluid levels or advanced mucosal thickening of the visualized paranasal sinuses. No mastoid or middle ear effusion. The orbits are normal. IMPRESSION: 1. Chronic small vessel disease without acute intracranial abnormality. 2. Remote right temporal craniectomy. Electronically Signed   By: Ulyses Jarred M.D.   On: 04/29/2021 19:51   _______________________________________________________________________________________________________ Latest  Blood pressure (!) 155/72, pulse (!) 54, temperature 98.1 F (36.7 C), temperature source Oral, resp. rate 17, height 5\' 3"  (1.6 m), weight 100.7 kg, SpO2 98 %.   Review of Systems:    Pertinent positives include:   fatigue, Chest pain dizziness, lightheadedness  Constitutional:  No weight loss, night sweats, Fevers, chills,weight loss  HEENT:  No headaches, Difficulty swallowing,Tooth/dental problems,Sore throat,  No sneezing, itching, ear ache, nasal congestion, post nasal drip,  Cardio-vascular:  No chest pain, Orthopnea, PND, anasarca,  palpitations.no Bilateral lower extremity swelling  GI:  No heartburn, indigestion, abdominal pain, nausea, vomiting, diarrhea, change in bowel habits, loss of appetite, melena, blood in stool, hematemesis Resp:  no shortness of breath at rest. No dyspnea on exertion, No excess mucus, no productive cough, No non-productive  cough, No coughing up of blood.No change in color of mucus.No wheezing. Skin:  no rash or lesions. No jaundice GU:  no dysuria, change in color of urine, no urgency or frequency. No straining to urinate.  No flank pain.  Musculoskeletal:  No joint pain or no joint swelling. No decreased range of motion. No back pain.  Psych:  No change in mood or affect. No depression or anxiety. No memory loss.  Neuro: no localizing neurological complaints, no tingling, no weakness, no double vision, no gait abnormality, no slurred speech, no confusion  All systems reviewed and apart from [place] all are negative _______________________________________________________________________________________________ Past Medical History:   Past Medical History:  Diagnosis Date   Acute on chronic diastolic heart failure ([place]) 03/05/2021   CHF (congestive heart failure) (HCC)    CKD (chronic kidney disease) stage 4, GFR 15-29 ml/min ([place]) 03/05/2021   Diabetes mellitus without complication ([place])    Hypertension    Hypertensive urgency 04/14/2021   Renal disorder    Stroke [facility])       No past surgical  history on file.  Social History:  Ambulatory   independently      reports that she has never smoked. She has never been exposed to tobacco smoke. She has never used smokeless tobacco. She reports that she does not currently use alcohol. She reports that she does not currently use drugs.     Family History:   Family History  Problem Relation Age of Onset   Hypertension Mother    Stroke Mother    Hypertension Father    Hypertension Sister    Hypertension Son    Hypertension Daughter    ______________________________________________________________________________________________ Allergies: Allergies  Allergen Reactions   Hydrocodone Itching    Patient able to tolerate when taken with Benadryl.   Oxycodone-Acetaminophen Itching    Patient able to tolerate when taken with Benadryl.     Prior to  Admission medications   Medication Sig Start Date End Date Taking? Authorizing Provider  allopurinol (ZYLOPRIM) 100 MG tablet Take 100 mg by mouth daily. 01/27/21   [provider]  amLODipine (NORVASC) 10 MG tablet Take 10 mg by mouth every morning. 02/03/21   [provider]  aspirin EC 81 MG tablet Take 81 mg by mouth daily. Swallow whole.    [provider]  atorvastatin (LIPITOR) 20 MG tablet Take 20 mg by mouth every evening. 03/02/21   [provider]  buPROPion (WELLBUTRIN) 100 MG tablet Take 100 mg by mouth 2 (two) times daily. 02/04/21   [provider]  carvedilol (COREG) 25 MG tablet Take 1 tablet (25 mg total) by mouth 2 (two) times daily with a meal. 03/07/21 04/24/21  [name], [name], MD  chlorthalidone (HYGROTON) 50 MG tablet Take 1 tablet (50 mg total) by mouth daily. 04/21/21   [name], DO  cloNIDine (CATAPRES) 0.2 MG tablet Take 0.4 mg by mouth 3 (three) times daily.    [provider]  doxazosin (CARDURA) 8 MG tablet Take 1 tablet (8 mg total) by mouth every evening. 04/20/21   [name], DO  empagliflozin (JARDIANCE) 10 MG TABS tablet Take 1 tablet (10 mg total) by mouth daily before breakfast. 04/24/21   Lyda Jester M, PA-C  ferrous sulfate 325 (65 FE) MG tablet Take 325 mg by mouth 2 (two) times daily. 02/24/21   [provider]  fluticasone (FLONASE) 50 MCG/ACT nasal spray Place 1 spray into both nostrils daily as needed for allergies or rhinitis.    [provider]  hydrALAZINE (APRESOLINE) 100 MG tablet Take 1 tablet (100 mg total) by mouth 3 (three) times daily. 03/12/21   Skeet Latch, MD  insulin glargine (LANTUS) 100 unit/mL SOPN Inject 20 Units into the skin at bedtime.    [provider]  isosorbide mononitrate (IMDUR) 120 MG 24 hr tablet Take 1 tablet (120 mg total) by mouth daily. 04/21/21   [name], DO  loratadine (CLARITIN) 10 MG tablet Take 10 mg by  mouth daily. 02/14/21   [provider]  Multiple Vitamin (MULTIVITAMIN ADULT) TABS Take 1 tablet by mouth daily.    [provider]  nitroGLYCERIN (NITROSTAT) 0.4 MG SL tablet Place 1 tablet (0.4 mg total) under the tongue every 5 (five) minutes as needed for chest pain. 03/07/21   [name], [name], MD  NOVOLOG FLEXPEN 100 UNIT/ML FlexPen Inject 8 Units into the skin 3 (three) times daily with meals. 01/12/21   [provider]  Omega-3 Fatty Acids (FISH OIL) 1000 MG CAPS Take 1 capsule by mouth  daily.    [provider]  pantoprazole (PROTONIX) 40 MG tablet Take 1 tablet (40 mg total) by mouth daily. 04/20/21   [name], DO  polyethylene glycol (MIRALAX / GLYCOLAX) 17 g packet Take 17 g by mouth daily. 03/07/21   [name], [name], MD  pregabalin (LYRICA) 25 MG capsule Take 25 mg by mouth 2 (two) times daily. 02/03/21   [provider]  senna (SENOKOT) 8.6 MG TABS tablet Take 2 tablets (17.2 mg total) by mouth daily. 04/21/21   [name], DO  sertraline (ZOLOFT) 50 MG tablet Take 50 mg by mouth daily. 02/01/21   [provider]  spironolactone (ALDACTONE) 25 MG tablet Take 1 tablet (25 mg total) by mouth daily. 04/21/21   [name], DO  torsemide 40 MG TABS Take 40 mg by mouth daily. 04/21/21   [name], DO  traZODone (DESYREL) 50 MG tablet Take 50 mg by mouth at bedtime. 04/23/21   [provider]    ___________________________________________________________________________________________________ Physical Exam: Vitals with BMI 04/29/2021 04/29/2021 04/29/2021  Height 5\' 3"  - -  Weight 222 lbs - -  BMI 54.27 - -  Systolic 062 376 283  Diastolic 72 66 66  Pulse 54 54 55     1. General:  in No  Acute distress   Chronically ill   -appearing 2. Psychological: Alert and   Oriented 3. Head/ENT:    Dry Mucous Membranes                          Head Non traumatic, neck supple                          Poor Dentition 4. SKIN:  decreased Skin turgor,  Skin clean Dry and intact no rash 5. Heart: Regular rate and rhythm no  Murmur, no Rub or gallop 6. Lungs:  no wheezes or crackles   7. Abdomen: Soft,  non-tender, Non distended   obese  bowel sounds present 8. Lower extremities: no clubbing, cyanosis, no  edema 9. Neurologically Grossly intact, moving all 4 extremities equally   10. MSK: Normal range of motion Chest wall tenderness reproducible by palpation  Chart has been reviewed  ______________________________________________________________________________________________  Assessment/Plan a 60 y.o. female with medical history significant of diastolic CHF, CKD, DM2, HTN,CVA  OSA not on CPAP and pulmonary HTN.     Admitted for AKI likely secondary to overdiuresis and musculoskeletal chest pain  Present on Admission:  AKI (acute kidney injury) ([place]) likely secondary to overdiuresis hold diuretics for tonight very gentle fluids obtain urine electrolytes   Essential hypertension -given orthostasis hold home medications for tonight but will resume soon as able to tolerate.  Patient is difficult to control hypertension   CKD (chronic kidney disease) stage 4, GFR 15-29 ml/min (HCC) -  -chronic avoid nephrotoxic medications such as NSAIDs, Vanco Zosyn combo,  avoid hypotension, continue to follow renal function   Chest pain -appears to be musculoskeletal based on exam troponins unremarkable.  Continue to monitor   Chronic diastolic CHF (congestive heart failure) ([place]) -now appears to be slightly dry we will hold diuretics.  DM2.  Order sliding scale continue Lantus decrease dose to 15 units   Other plan as per orders.  DVT prophylaxis:  hep [place]     Code Status:    Code Status: Prior FULL CODE   as per patient she states  in the past that she stated that she wanted to be DO NOT RESUSCITATE but she wants to change her mind and become full code at this time I had personally discussed CODE  STATUS with patient    Family Communication:   Family not at  Bedside    Disposition Plan:    To home once workup is complete and patient is stable   Following barriers for discharge:                            Electrolytes corrected                               Anemia  stable                              antibiotics                             Will need to be able to tolerate PO                                          Will need consultants to evaluate patient prior to discharge                      Consults called: none  Admission status:  ED Disposition     ED Disposition  [place]: [place] [[id number]]  Level of Care: Telemetry Cardiac [[id number]]  May admit patient to [facility] or [facility] if equivalent level of care is available:: No  Covid Evaluation: Confirmed COVID Negative  Diagnosis: AKI (acute kidney injury) [facility]) [[id number]]  Admitting Physician: Toy Baker [[id number]]  Attending Physician: Toy Baker [[id number]]  Estimated length of stay: past midnight tomorrow  Certification:: I certify this patient will need inpatient services for at least 2 midnights            inpatient     I Expect 2 midnight stay secondary to severity of patient's current illness need for inpatient interventions justified by the following:  hemodynamic instability despite optimal treatment ( hypotension )  Severe lab/radiological/exam abnormalities including:    AKI and extensive comorbidities including:    DM2   CHF    CKD    That are currently affecting medical management.   I expect  patient to be hospitalized for 2 midnights requiring inpatient medical care.  Patient is at high risk for adverse outcome (such as loss of life or disability) if not treated.  Indication for inpatient stay as follows:   Hemodynamic instability despite maximal medical therapy,       Need for  IV fluids,      Level of care      tele  For 12H     Lab Results  Component Value Date   [place] NEGATIVE 04/29/2021     Precautions: admitted as   Covid Negative     PPE: Used by the provider:   N95  eye Goggles,  Gloves     Maria Hudson 04/29/2021, 11:37 PM   [facility]     after 2 AM please page  floor coverage PA If 7AM-7PM, please contact the day team taking care of the patient using [email or web address]   Patient was evaluated in the context of the global COVID-19 pandemic, which necessitated consideration that the patient might be at risk for infection with the SARS-CoV-2 virus that causes COVID-19. Institutional protocols and algorithms that pertain to the evaluation of patients at risk for COVID-19 are in a state of rapid change based on information released by regulatory bodies including the CDC and federal and state organizations. These policies and algorithms were followed during the patient's care.

## 2021-04-29 NOTE — Telephone Encounter (Signed)
Fax sent at this time.

## 2021-04-29 NOTE — ED Triage Notes (Signed)
Pt reports "always" having generalized chest pain, but it has worsened over the last four days. Has headache starting today and dizziness when only when standing x 4 days. Denies shob.

## 2021-04-30 ENCOUNTER — Inpatient Hospital Stay (HOSPITAL_COMMUNITY): Payer: Medicaid Other

## 2021-04-30 ENCOUNTER — Ambulatory Visit (HOSPITAL_BASED_OUTPATIENT_CLINIC_OR_DEPARTMENT_OTHER): Payer: Medicaid Other | Admitting: Family

## 2021-04-30 DIAGNOSIS — N1832 Chronic kidney disease, stage 3b: Secondary | ICD-10-CM | POA: Diagnosis not present

## 2021-04-30 DIAGNOSIS — R079 Chest pain, unspecified: Secondary | ICD-10-CM

## 2021-04-30 DIAGNOSIS — N179 Acute kidney failure, unspecified: Secondary | ICD-10-CM | POA: Diagnosis not present

## 2021-04-30 DIAGNOSIS — N184 Chronic kidney disease, stage 4 (severe): Secondary | ICD-10-CM | POA: Diagnosis not present

## 2021-04-30 DIAGNOSIS — Z794 Long term (current) use of insulin: Secondary | ICD-10-CM | POA: Diagnosis not present

## 2021-04-30 DIAGNOSIS — R1013 Epigastric pain: Secondary | ICD-10-CM | POA: Diagnosis not present

## 2021-04-30 DIAGNOSIS — E1122 Type 2 diabetes mellitus with diabetic chronic kidney disease: Secondary | ICD-10-CM | POA: Diagnosis not present

## 2021-04-30 DIAGNOSIS — I10 Essential (primary) hypertension: Secondary | ICD-10-CM | POA: Diagnosis not present

## 2021-04-30 DIAGNOSIS — I951 Orthostatic hypotension: Secondary | ICD-10-CM | POA: Diagnosis not present

## 2021-04-30 LAB — CBG MONITORING, ED
Glucose-Capillary: 133 mg/dL — ABNORMAL HIGH (ref 70–99)
Glucose-Capillary: 135 mg/dL — ABNORMAL HIGH (ref 70–99)
Glucose-Capillary: 135 mg/dL — ABNORMAL HIGH (ref 70–99)
Glucose-Capillary: 158 mg/dL — ABNORMAL HIGH (ref 70–99)
Glucose-Capillary: 184 mg/dL — ABNORMAL HIGH (ref 70–99)

## 2021-04-30 LAB — D-DIMER, QUANTITATIVE: D-Dimer, Quant: 0.51 ug/mL-FEU — ABNORMAL HIGH (ref 0.00–0.50)

## 2021-04-30 LAB — ECHOCARDIOGRAM LIMITED
Height: 63 in
Weight: 3552 oz

## 2021-04-30 LAB — COMPREHENSIVE METABOLIC PANEL
ALT: 8 U/L (ref 0–44)
AST: 12 U/L — ABNORMAL LOW (ref 15–41)
Albumin: 3.1 g/dL — ABNORMAL LOW (ref 3.5–5.0)
Alkaline Phosphatase: 45 U/L (ref 38–126)
Anion gap: 11 (ref 5–15)
BUN: 84 mg/dL — ABNORMAL HIGH (ref 6–20)
CO2: 24 mmol/L (ref 22–32)
Calcium: 9.1 mg/dL (ref 8.9–10.3)
Chloride: 103 mmol/L (ref 98–111)
Creatinine, Ser: 3.6 mg/dL — ABNORMAL HIGH (ref 0.44–1.00)
GFR, Estimated: 14 mL/min — ABNORMAL LOW (ref >60)
Glucose, Bld: 147 mg/dL — ABNORMAL HIGH (ref 70–99)
Potassium: 3.6 mmol/L (ref 3.5–5.1)
Sodium: 138 mmol/L (ref 135–145)
Total Bilirubin: 0.7 mg/dL (ref 0.3–1.2)
Total Protein: 6.3 g/dL — ABNORMAL LOW (ref 6.5–8.1)

## 2021-04-30 LAB — CBC WITH DIFFERENTIAL/PLATELET
Abs Immature Granulocytes: 0.02 10*3/uL (ref 0.00–0.07)
Basophils Absolute: 0.1 10*3/uL (ref 0.0–0.1)
Basophils Relative: 1 %
Eosinophils Absolute: 0.3 10*3/uL (ref 0.0–0.5)
Eosinophils Relative: 6 %
HCT: 33.7 % — ABNORMAL LOW (ref 36.0–46.0)
Hemoglobin: 10.7 g/dL — ABNORMAL LOW (ref 12.0–15.0)
Immature Granulocytes: 0 %
Lymphocytes Relative: 19 %
Lymphs Abs: 1 10*3/uL (ref 0.7–4.0)
MCH: 26.5 pg (ref 26.0–34.0)
MCHC: 31.8 g/dL (ref 30.0–36.0)
MCV: 83.4 fL (ref 80.0–100.0)
Monocytes Absolute: 0.6 10*3/uL (ref 0.1–1.0)
Monocytes Relative: 11 %
Neutro Abs: 3.2 10*3/uL (ref 1.7–7.7)
Neutrophils Relative %: 63 %
Platelets: 226 10*3/uL (ref 150–400)
RBC: 4.04 MIL/uL (ref 3.87–5.11)
RDW: 17.1 % — ABNORMAL HIGH (ref 11.5–15.5)
WBC: 5.2 10*3/uL (ref 4.0–10.5)
nRBC: 0 % (ref 0.0–0.2)

## 2021-04-30 LAB — TSH: TSH: 3.173 u[IU]/mL (ref 0.350–4.500)

## 2021-04-30 LAB — SODIUM, URINE, RANDOM: Sodium, Ur: 70 mmol/L

## 2021-04-30 LAB — PHOSPHORUS: Phosphorus: 4.7 mg/dL — ABNORMAL HIGH (ref 2.5–4.6)

## 2021-04-30 LAB — GLUCOSE, CAPILLARY
Glucose-Capillary: 180 mg/dL — ABNORMAL HIGH (ref 70–99)
Glucose-Capillary: 204 mg/dL — ABNORMAL HIGH (ref 70–99)

## 2021-04-30 LAB — CREATININE, URINE, RANDOM: Creatinine, Urine: 49.77 mg/dL

## 2021-04-30 LAB — MAGNESIUM: Magnesium: 2.2 mg/dL (ref 1.7–2.4)

## 2021-04-30 MED ORDER — AMLODIPINE BESYLATE 10 MG PO TABS
10.0000 mg | ORAL_TABLET | Freq: Every morning | ORAL | Status: DC
  Administered 2021-04-30 – 2021-05-03 (×4): 10 mg via ORAL
  Filled 2021-04-30 (×3): qty 1
  Filled 2021-04-30: qty 2

## 2021-04-30 MED ORDER — HYDROMORPHONE HCL 1 MG/ML IJ SOLN
0.5000 mg | INTRAMUSCULAR | Status: DC | PRN
  Administered 2021-04-30 – 2021-05-01 (×3): 0.5 mg via INTRAVENOUS
  Filled 2021-04-30: qty 0.5
  Filled 2021-04-30 (×2): qty 1

## 2021-04-30 MED ORDER — DICLOFENAC SODIUM 1 % EX GEL
2.0000 g | Freq: Four times a day (QID) | CUTANEOUS | Status: DC
  Administered 2021-04-30 – 2021-05-02 (×4): 2 g via TOPICAL
  Filled 2021-04-30 (×3): qty 100

## 2021-04-30 MED ORDER — CLONIDINE HCL 0.3 MG PO TABS
0.3000 mg | ORAL_TABLET | Freq: Two times a day (BID) | ORAL | Status: DC
  Administered 2021-04-30 – 2021-05-02 (×5): 0.3 mg via ORAL
  Filled 2021-04-30 (×5): qty 1

## 2021-04-30 NOTE — Progress Notes (Addendum)
Progress Note    Maria Hudson  BOF:[id number] DOB: Aug 26, 1960  DOA: 04/29/2021 PCP: Nolene Ebbs, MD    Brief Narrative:     Medical records reviewed and are as summarized below:  Maria Hudson is an 60 y.o. female  with medical history significant of diastolic CHF, CKD, DM2, HTN,CVA  OSA not on CPAP and pulmonary HTN.  Admitted for AKI likely secondary to overdiuresis and musculoskeletal chest pain. Has not yet started the [place] started as an outpatient   Assessment/Plan:   Active Problems:   Chest pain   T2DM (type 2 diabetes mellitus) ([place])   CKD (chronic kidney disease) stage 4, GFR 15-29 ml/min (HCC)   Essential hypertension   AKI (acute kidney injury) (HCC)   Chronic diastolic CHF (congestive heart failure) (HCC)   AKI (acute kidney injury) ([place])  -likely secondary to overdiuresis  -hold diuretics  -gentle IVF today  -labs in AM  Orthostatic hypotension -recheck in AM after IVF  -drops from 170s to 120s with standing   Essential hypertension  -given orthostasis  --resume meds as able    CKD (chronic kidney disease) stage 4, GFR 15-29 ml/min (HCC) -   -avoid nephrotoxic medications such as NSAIDs, Vanco Zosyn combo - avoid hypotension  Chest pain  -appears to be musculoskeletal based on exam  -troponins unremarkable --check echo limited for pericardial fluid-- pain better with leaning forward -no rub heard   Chronic diastolic CHF (congestive heart failure) (HCC)  -now appears to be slightly dry  - hold diuretics.   DM2.   -Order sliding scale continue Lantus decrease dose to 15 units  obesity Body mass index is 39.33 kg/m.   Family Communication/Anticipated D/C date and plan/Code Status   DVT prophylaxis: heparin Code Status: Full Code.  Disposition Plan: Status is: Inpatient  Remains inpatient appropriate because: need for medication adjustments         Medical Consultants:   None.    Subjective:   Having severe  pain, worse with palpation, improved with leaning forward some  Objective:    Vitals:   04/30/21 0800 04/30/21 0815 04/30/21 0900 04/30/21 1030  BP:  (!) 198/73 (!) 184/72 (!) 171/74  Pulse: (!) 58 (!) 56 (!) 55 (!) 54  Resp: 14 11 15 16   Temp:      TempSrc:      SpO2: 100% 100% 99% 99%  Weight:      Height:        Intake/Output Summary (Last 24 hours) at 04/30/2021 1140 Last data filed at 04/30/2021 7782 Gross per 24 hour  Intake --  Output 1000 ml  Net -1000 ml   Filed Weights   04/29/21 1820  Weight: 100.7 kg    Exam:  General: Appearance:    Obese female who appears uncomfortable     Lungs:      respirations unlabored  Heart:    Bradycardic.   MS:   All extremities are intact.    Neurologic:   Awake, alert, oriented x 3. No apparent focal neurological           defect.      Data Reviewed:   I have personally reviewed following labs and imaging studies:  Labs: Labs show the following:   Basic Metabolic Panel: Recent Labs  Lab 04/24/21 1020 04/29/21 1323 04/29/21 2226 04/30/21 0347  NA 139 136  --  138  K 4.1 3.9  --  3.6  CL 105 99  --  103  CO2 25 23  --  24  GLUCOSE 173* 171*  --  147*  BUN 52* 86*  --  84*  CREATININE 2.32* 3.84*  --  3.60*  CALCIUM 8.8* 9.0  --  9.1  MG  --   --  2.2 2.2  PHOS  --   --  5.0* 4.7*   GFR Estimated Creatinine Clearance: 19 mL/min (A) (by C-G formula based on SCr of 3.6 mg/dL (H)). Liver Function Tests: Recent Labs  Lab 04/29/21 2226 04/30/21 0347  AST 13* 12*  ALT 9 8  ALKPHOS 44 45  BILITOT 0.6 0.7  PROT 6.1* 6.3*  ALBUMIN 3.1* 3.1*   No results for input(s): LIPASE, AMYLASE in the last 168 hours. No results for input(s): AMMONIA in the last 168 hours. Coagulation profile No results for input(s): INR, PROTIME in the last 168 hours.  CBC: Recent Labs  Lab 04/29/21 1323 04/29/21 2226 04/30/21 0347  WBC 5.4 4.9 5.2  NEUTROABS  --  2.8 3.2  HGB 10.8* 11.2* 10.7*  HCT 33.6* 34.7* 33.7*  MCV  82.8 83.8 83.4  PLT 242 222 226   Cardiac Enzymes: Recent Labs  Lab 04/29/21 2226  CKTOTAL 68   BNP (last 3 results) No results for input(s): PROBNP in the last 8760 hours. CBG: Recent Labs  Lab 04/30/21 0028 04/30/21 0432 04/30/21 0809  GLUCAP 135* 133* 135*   D-Dimer: Recent Labs    04/30/21 0816  DDIMER 0.51*   Hgb A1c: Recent Labs    04/29/21 2226  HGBA1C 6.7*   Lipid Profile: No results for input(s): CHOL, HDL, LDLCALC, TRIG, CHOLHDL, LDLDIRECT in the last 72 hours. Thyroid function studies: Recent Labs    04/30/21 0347  TSH 3.173   Anemia work up: No results for input(s): VITAMINB12, FOLATE, FERRITIN, TIBC, IRON, RETICCTPCT in the last 72 hours. Sepsis Labs: Recent Labs  Lab 04/29/21 1323 04/29/21 2226 04/30/21 0347  WBC 5.4 4.9 5.2    Microbiology Recent Results (from the past 240 hour(s))  Resp Panel by RT-PCR (Flu A&B, Covid) Nasopharyngeal Swab     Status: None   Collection Time: 04/29/21  6:51 PM   Specimen: Nasopharyngeal Swab; Nasopharyngeal(NP) swabs in vial transport medium  Result Value Ref Range Status   SARS Coronavirus 2 by RT PCR NEGATIVE NEGATIVE Final    Comment: (NOTE) SARS-CoV-2 target nucleic acids are NOT DETECTED.  The SARS-CoV-2 RNA is generally detectable in upper respiratory specimens during the acute phase of infection. The lowest concentration of SARS-CoV-2 viral copies this assay can detect is 138 copies/mL. A negative result does not preclude SARS-Cov-2 infection and should not be used as the sole basis for treatment or other patient management decisions. A negative result may occur with  improper specimen collection/handling, submission of specimen other than nasopharyngeal swab, presence of viral mutation(s) within the areas targeted by this assay, and inadequate number of viral copies(<138 copies/mL). A negative result must be combined with clinical observations, patient history, and  epidemiological information. The expected result is Negative.  Fact Sheet for Patients:  EntrepreneurPulse.com.au  Fact Sheet for Healthcare Providers:  IncredibleEmployment.be  This test is no t yet approved or cleared by the [place] FDA and  has been authorized for detection and/or diagnosis of SARS-CoV-2 by FDA under an Emergency Use Authorization (EUA). This EUA will remain  in effect (meaning this test can be used) for the duration of the COVID-19 declaration under Section 564(b)(1) of the Act, 21 U.S.C.section 360bbb-3(b)(1), unless the  authorization is terminated  or revoked sooner.       Influenza A by PCR NEGATIVE NEGATIVE Final   Influenza B by PCR NEGATIVE NEGATIVE Final    Comment: (NOTE) The Xpert Xpress SARS-CoV-2/FLU/RSV plus assay is intended as an aid in the diagnosis of influenza from Nasopharyngeal swab specimens and should not be used as a sole basis for treatment. Nasal washings and aspirates are unacceptable for Xpert Xpress SARS-CoV-2/FLU/RSV testing.  Fact Sheet for Patients: EntrepreneurPulse.com.au  Fact Sheet for Healthcare Providers: IncredibleEmployment.be  This test is not yet approved or cleared by the [place] FDA and has been authorized for detection and/or diagnosis of SARS-CoV-2 by FDA under an Emergency Use Authorization (EUA). This EUA will remain in effect (meaning this test can be used) for the duration of the COVID-19 declaration under Section 564(b)(1) of the Act, 21 U.S.C. section 360bbb-3(b)(1), unless the authorization is terminated or revoked.  Performed at [facility], [place] [address], [place], [place] [address]     Procedures and diagnostic studies:  DG Chest 2 View  Result Date: 04/29/2021 CLINICAL DATA:  generalized chest pain EXAM: CHEST - 2 VIEW COMPARISON:  04/14/2021 FINDINGS: Normal mediastinum and cardiac silhouette. Normal  pulmonary vasculature. No evidence of effusion, infiltrate, or pneumothorax. No acute bony abnormality. IMPRESSION: No acute cardiopulmonary process. Electronically Signed   By: Suzy Bouchard M.D.   On: 04/29/2021 19:31   CT HEAD WO CONTRAST (5MM)  Result Date: 04/29/2021 CLINICAL DATA:  Headache EXAM: CT HEAD WITHOUT CONTRAST TECHNIQUE: Contiguous axial images were obtained from the base of the skull through the vertex without intravenous contrast. COMPARISON:  None. FINDINGS: Brain: There is no mass, hemorrhage or extra-axial collection. The size and configuration of the ventricles and extra-axial CSF spaces are normal. There is hypoattenuation of the white matter, most commonly indicating chronic small vessel disease. Vascular: Atherosclerotic calcification of the vertebral and internal carotid arteries at the skull base. No abnormal hyperdensity of the major intracranial arteries or dural venous sinuses. Skull: Remote right temporal craniectomy Sinuses/Orbits: No fluid levels or advanced mucosal thickening of the visualized paranasal sinuses. No mastoid or middle ear effusion. The orbits are normal. IMPRESSION: 1. Chronic small vessel disease without acute intracranial abnormality. 2. Remote right temporal craniectomy. Electronically Signed   By: Ulyses Jarred M.D.   On: 04/29/2021 19:51    Medications:    amLODipine  10 mg Oral q morning   aspirin EC  81 mg Oral Daily   atorvastatin  20 mg Oral QPM   carvedilol  25 mg Oral BID WC   cloNIDine  0.3 mg Oral BID   diclofenac Sodium  2 g Topical QID   heparin  5,000 Units Subcutaneous Q8H   insulin aspart  0-6 Units Subcutaneous Q4H   pantoprazole  40 mg Oral Daily   sertraline  50 mg Oral Daily   Continuous Infusions:  sodium chloride 50 mL/hr at 04/30/21 0006   methocarbamol (ROBAXIN) IV       LOS: 1 day   [name]  [facility]   How to contact the [facility] Attending or Consulting provider [place] or covering provider  during after hours [place], for this patient?  Check the care team in [facility] and look for a) attending/consulting [facility] provider listed and b) the [facility] team listed Log into [email or web address] and use [place]'s universal password to access. If you do not have the password, please contact the hospital operator. Locate the [facility]  provider you are looking for under [facility] and page to a number that you can be directly reached. If you still have difficulty reaching the provider, please page the [facility] (Director on Call) for the Hospitalists listed on amion for assistance.  04/30/2021, 11:40 AM

## 2021-04-30 NOTE — ED Notes (Signed)
Dr. Vann at bedside 

## 2021-04-30 NOTE — ED Notes (Signed)
Pt ambulated in hallway. Pt's O2 was 93%. Pt denied dizziness while ambulating.

## 2021-04-30 NOTE — Progress Notes (Signed)
  Echocardiogram 2D Echocardiogram has been performed.  Merrie Roof F 04/30/2021, 12:03 PM

## 2021-04-30 NOTE — ED Notes (Signed)
Pt wanted eggs for breakfast. Have been ordered.

## 2021-04-30 NOTE — ED Notes (Signed)
CBG result of 133

## 2021-05-01 ENCOUNTER — Other Ambulatory Visit (HOSPITAL_COMMUNITY): Payer: Medicaid Other

## 2021-05-01 DIAGNOSIS — N179 Acute kidney failure, unspecified: Secondary | ICD-10-CM | POA: Diagnosis not present

## 2021-05-01 DIAGNOSIS — N1832 Chronic kidney disease, stage 3b: Secondary | ICD-10-CM | POA: Diagnosis not present

## 2021-05-01 DIAGNOSIS — N184 Chronic kidney disease, stage 4 (severe): Secondary | ICD-10-CM | POA: Diagnosis not present

## 2021-05-01 DIAGNOSIS — E1122 Type 2 diabetes mellitus with diabetic chronic kidney disease: Secondary | ICD-10-CM | POA: Diagnosis not present

## 2021-05-01 DIAGNOSIS — R079 Chest pain, unspecified: Secondary | ICD-10-CM | POA: Diagnosis not present

## 2021-05-01 DIAGNOSIS — Z794 Long term (current) use of insulin: Secondary | ICD-10-CM | POA: Diagnosis not present

## 2021-05-01 DIAGNOSIS — I10 Essential (primary) hypertension: Secondary | ICD-10-CM | POA: Diagnosis not present

## 2021-05-01 DIAGNOSIS — R1013 Epigastric pain: Secondary | ICD-10-CM | POA: Diagnosis not present

## 2021-05-01 DIAGNOSIS — I951 Orthostatic hypotension: Secondary | ICD-10-CM | POA: Diagnosis not present

## 2021-05-01 LAB — GLUCOSE, CAPILLARY
Glucose-Capillary: 181 mg/dL — ABNORMAL HIGH (ref 70–99)
Glucose-Capillary: 203 mg/dL — ABNORMAL HIGH (ref 70–99)
Glucose-Capillary: 136 mg/dL — ABNORMAL HIGH (ref 70–99)
Glucose-Capillary: 153 mg/dL — ABNORMAL HIGH (ref 70–99)

## 2021-05-01 LAB — BASIC METABOLIC PANEL
Anion gap: 10 (ref 5–15)
BUN: 79 mg/dL — ABNORMAL HIGH (ref 6–20)
CO2: 24 mmol/L (ref 22–32)
Calcium: 8.5 mg/dL — ABNORMAL LOW (ref 8.9–10.3)
Chloride: 102 mmol/L (ref 98–111)
Creatinine, Ser: 3.32 mg/dL — ABNORMAL HIGH (ref 0.44–1.00)
GFR, Estimated: 15 mL/min — ABNORMAL LOW (ref >60)
Glucose, Bld: 149 mg/dL — ABNORMAL HIGH (ref 70–99)
Potassium: 3.6 mmol/L (ref 3.5–5.1)
Sodium: 136 mmol/L (ref 135–145)

## 2021-05-01 LAB — URINE CULTURE: Culture: 80000 — AB

## 2021-05-01 MED ORDER — INSULIN ASPART 100 UNIT/ML IJ SOLN
0.0000 [IU] | Freq: Three times a day (TID) | INTRAMUSCULAR | Status: DC
  Administered 2021-05-01: 12:00:00 5 [IU] via SUBCUTANEOUS
  Administered 2021-05-01 – 2021-05-02 (×2): 2 [IU] via SUBCUTANEOUS
  Administered 2021-05-02 (×2): 3 [IU] via SUBCUTANEOUS
  Administered 2021-05-03: 5 [IU] via SUBCUTANEOUS

## 2021-05-01 MED ORDER — SIMETHICONE 80 MG PO CHEW
80.0000 mg | CHEWABLE_TABLET | Freq: Every day | ORAL | Status: DC | PRN
Start: 2021-05-01 — End: 2021-05-03
  Administered 2021-05-01: 21:00:00 160 mg via ORAL
  Filled 2021-05-01: qty 2

## 2021-05-01 MED ORDER — INSULIN ASPART 100 UNIT/ML IJ SOLN
5.0000 [IU] | Freq: Three times a day (TID) | INTRAMUSCULAR | Status: DC
  Administered 2021-05-01 – 2021-05-03 (×6): 5 [IU] via SUBCUTANEOUS

## 2021-05-01 MED ORDER — ALUM & MAG HYDROXIDE-SIMETH 200-200-20 MG/5 ML NICU TOPICAL: 1.0000 "application " | Freq: Every day | TOPICAL | Status: DC | PRN

## 2021-05-01 MED ORDER — INSULIN ASPART 100 UNIT/ML IJ SOLN: 0.0000 [IU] | Freq: Every day | INTRAMUSCULAR | Status: DC

## 2021-05-01 NOTE — Progress Notes (Signed)
Mobility Specialist Progress Note:   05/01/21 1100  Mobility  Activity Ambulated in hall  Level of Assistance Modified independent, requires aide device or extra time  Assistive Device Front wheel walker  Distance Ambulated (ft) 550 ft  Mobility Ambulated with assistance in hallway  Mobility Response Tolerated well  Mobility performed by Mobility specialist  Bed Position Chair  $Mobility charge 1 Mobility   Pt received in chair willing to participate in mobility. No complaints of pain and asymptomatic. Pt returned to chair with call bell in reach and all needs met.  [facility] Public librarian Phone [phone] Secondary Phone [phone]

## 2021-05-01 NOTE — Progress Notes (Signed)
Progress Note    Maria Hudson  NGE:[id number] DOB: [birth year]  DOA: 04/29/2021 PCP: Nolene Ebbs, MD    Brief Narrative:     Medical records reviewed and are as summarized below:  Maria Hudson is an 60 y.o. female  with medical history significant of diastolic CHF, CKD, DM2, HTN,CVA  OSA not on CPAP and pulmonary HTN.  Admitted for AKI likely secondary to overdiuresis and musculoskeletal chest pain. Has not yet taken the [place] started as an outpatient   Assessment/Plan:   Active Problems:   Chest pain   T2DM (type 2 diabetes mellitus) ([place])   CKD (chronic kidney disease) stage 4, GFR 15-29 ml/min (HCC)   Essential hypertension   AKI (acute kidney injury) (HCC)   Chronic diastolic CHF (congestive heart failure) (HCC)   AKI (acute kidney injury) ([place])  -likely secondary to overdiuresis  -hold diuretics  -s/p gentle IVF -has nephrology appointment on Monday  -labs in AM  Orthostatic hypotension -patient was less symptomatic today  Essential hypertension  --resume meds as able -will need to allow for permissive supine BP as it drops when she gets up    CKD (chronic kidney disease) stage 4, GFR 15-29 ml/min (HCC) -   -avoid nephrotoxic medications such as NSAIDs, Vanco Zosyn combo - avoid hypotension  Chest pain  -appears to be musculoskeletal based on exam  -troponins unremarkable --echo stable, no fluid   Chronic diastolic CHF (congestive heart failure) (HCC)  -now appears to be slightly dry  - hold diuretics.   DM2.   -Order sliding scale continue Lantus decrease dose to 15 units  obesity Body mass index is 38.78 kg/m.   Family Communication/Anticipated D/C date and plan/Code Status   DVT prophylaxis: heparin Code Status: Full Code.  Disposition Plan: Status is: Inpatient  Remains inpatient appropriate because: need for medication adjustments         Medical Consultants:   None.    Subjective:   Feeling better  today  Objective:    Vitals:   05/01/21 0856 05/01/21 0902 05/01/21 0906 05/01/21 1106  BP: (!) 198/75 (!) 168/68 (!) 152/70 (!) 172/73  Pulse: (!) 59 62 62 (!) 56  Resp:    19  Temp:    97.8 F (36.6 C)  TempSrc:    Oral  SpO2:  96% 99% 100%  Weight:      Height:        Intake/Output Summary (Last 24 hours) at 05/01/2021 1415 Last data filed at 05/01/2021 1100 Gross per 24 hour  Intake 1560 ml  Output --  Net 1560 ml   Filed Weights   04/29/21 1820 05/01/21 0423  Weight: 100.7 kg 99.3 kg    Exam:   General: Appearance:    Obese female in no acute distress     Lungs:     respirations unlabored  Heart:    Bradycardic.   MS:   All extremities are intact.    Neurologic:   Awake, alert, oriented x 3       Data Reviewed:   I have personally reviewed following labs and imaging studies:  Labs: Labs show the following:   Basic Metabolic Panel: Recent Labs  Lab 04/29/21 1323 04/29/21 2226 04/30/21 0347 05/01/21 0247  NA 136  --  138 136  K 3.9  --  3.6 3.6  CL 99  --  103 102  CO2 23  --  24 24  GLUCOSE 171*  --  147* 149*  BUN  86*  --  84* 79*  CREATININE 3.84*  --  3.60* 3.32*  CALCIUM 9.0  --  9.1 8.5*  MG  --  2.2 2.2  --   PHOS  --  5.0* 4.7*  --    GFR Estimated Creatinine Clearance: 20.5 mL/min (A) (by C-G formula based on SCr of 3.32 mg/dL (H)). Liver Function Tests: Recent Labs  Lab 04/29/21 2226 04/30/21 0347  AST 13* 12*  ALT 9 8  ALKPHOS 44 45  BILITOT 0.6 0.7  PROT 6.1* 6.3*  ALBUMIN 3.1* 3.1*   No results for input(s): LIPASE, AMYLASE in the last 168 hours. No results for input(s): AMMONIA in the last 168 hours. Coagulation profile No results for input(s): INR, PROTIME in the last 168 hours.  CBC: Recent Labs  Lab 04/29/21 1323 04/29/21 2226 04/30/21 0347  WBC 5.4 4.9 5.2  NEUTROABS  --  2.8 3.2  HGB 10.8* 11.2* 10.7*  HCT 33.6* 34.7* 33.7*  MCV 82.8 83.8 83.4  PLT 242 222 226   Cardiac Enzymes: Recent Labs   Lab 04/29/21 2226  CKTOTAL 68   BNP (last 3 results) No results for input(s): PROBNP in the last 8760 hours. CBG: Recent Labs  Lab 04/30/21 1617 04/30/21 1900 04/30/21 2352 05/01/21 0622 05/01/21 1104  GLUCAP 184* 180* 204* 181* 203*   D-Dimer: Recent Labs    04/30/21 0816  DDIMER 0.51*   Hgb A1c: Recent Labs    04/29/21 2226  HGBA1C 6.7*   Lipid Profile: No results for input(s): CHOL, HDL, LDLCALC, TRIG, CHOLHDL, LDLDIRECT in the last 72 hours. Thyroid function studies: Recent Labs    04/30/21 0347  TSH 3.173   Anemia work up: No results for input(s): VITAMINB12, FOLATE, FERRITIN, TIBC, IRON, RETICCTPCT in the last 72 hours. Sepsis Labs: Recent Labs  Lab 04/29/21 1323 04/29/21 2226 04/30/21 0347  WBC 5.4 4.9 5.2    Microbiology Recent Results (from the past 240 hour(s))  Resp Panel by RT-PCR (Flu A&B, Covid) Nasopharyngeal Swab     Status: None   Collection Time: 04/29/21  6:51 PM   Specimen: Nasopharyngeal Swab; Nasopharyngeal(NP) swabs in vial transport medium  Result Value Ref Range Status   SARS Coronavirus 2 by RT PCR NEGATIVE NEGATIVE Final    Comment: (NOTE) SARS-CoV-2 target nucleic acids are NOT DETECTED.  The SARS-CoV-2 RNA is generally detectable in upper respiratory specimens during the acute phase of infection. The lowest concentration of SARS-CoV-2 viral copies this assay can detect is 138 copies/mL. A negative result does not preclude SARS-Cov-2 infection and should not be used as the sole basis for treatment or other patient management decisions. A negative result may occur with  improper specimen collection/handling, submission of specimen other than nasopharyngeal swab, presence of viral mutation(s) within the areas targeted by this assay, and inadequate number of viral copies(<138 copies/mL). A negative result must be combined with clinical observations, patient history, and epidemiological information. The expected result is  Negative.  Fact Sheet for Patients:  EntrepreneurPulse.com.au  Fact Sheet for Healthcare Providers:  IncredibleEmployment.be  This test is no t yet approved or cleared by the [place] FDA and  has been authorized for detection and/or diagnosis of SARS-CoV-2 by FDA under an Emergency Use Authorization (EUA). This EUA will remain  in effect (meaning this test can be used) for the duration of the COVID-19 declaration under Section 564(b)(1) of the Act, 21 U.S.C.section 360bbb-3(b)(1), unless the authorization is terminated  or revoked sooner.  Influenza A by PCR NEGATIVE NEGATIVE Final   Influenza B by PCR NEGATIVE NEGATIVE Final    Comment: (NOTE) The Xpert Xpress SARS-CoV-2/FLU/RSV plus assay is intended as an aid in the diagnosis of influenza from Nasopharyngeal swab specimens and should not be used as a sole basis for treatment. Nasal washings and aspirates are unacceptable for Xpert Xpress SARS-CoV-2/FLU/RSV testing.  Fact Sheet for Patients: EntrepreneurPulse.com.au  Fact Sheet for Healthcare Providers: IncredibleEmployment.be  This test is not yet approved or cleared by the [place] FDA and has been authorized for detection and/or diagnosis of SARS-CoV-2 by FDA under an Emergency Use Authorization (EUA). This EUA will remain in effect (meaning this test can be used) for the duration of the COVID-19 declaration under Section 564(b)(1) of the Act, 21 U.S.C. section 360bbb-3(b)(1), unless the authorization is terminated or revoked.  Performed at [facility], [place] [address] [place], [place] [address]   Urine Culture     Status: Abnormal   Collection Time: 04/30/21  3:29 AM   Specimen: Urine, Clean Catch  Result Value Ref Range Status   Specimen Description URINE, CLEAN CATCH  Final   Special Requests NONE  Final   Culture (A)  Final    80,000 COLONIES/mL LACTOBACILLUS  SPECIES Standardized susceptibility testing for this organism is not available. Performed at [facility], [place] [address] [place], [place] [address]    Report Status 05/01/2021 FINAL  Final    Procedures and diagnostic studies:  DG Chest 2 View  Result Date: 04/29/2021 CLINICAL DATA:  generalized chest pain EXAM: CHEST - 2 VIEW COMPARISON:  04/14/2021 FINDINGS: Normal mediastinum and cardiac silhouette. Normal pulmonary vasculature. No evidence of effusion, infiltrate, or pneumothorax. No acute bony abnormality. IMPRESSION: No acute cardiopulmonary process. Electronically Signed   By: Suzy Bouchard M.D.   On: 04/29/2021 19:31   CT HEAD WO CONTRAST (5MM)  Result Date: 04/29/2021 CLINICAL DATA:  Headache EXAM: CT HEAD WITHOUT CONTRAST TECHNIQUE: Contiguous axial images were obtained from the base of the skull through the vertex without intravenous contrast. COMPARISON:  None. FINDINGS: Brain: There is no mass, hemorrhage or extra-axial collection. The size and configuration of the ventricles and extra-axial CSF spaces are normal. There is hypoattenuation of the white matter, most commonly indicating chronic small vessel disease. Vascular: Atherosclerotic calcification of the vertebral and internal carotid arteries at the skull base. No abnormal hyperdensity of the major intracranial arteries or dural venous sinuses. Skull: Remote right temporal craniectomy Sinuses/Orbits: No fluid levels or advanced mucosal thickening of the visualized paranasal sinuses. No mastoid or middle ear effusion. The orbits are normal. IMPRESSION: 1. Chronic small vessel disease without acute intracranial abnormality. 2. Remote right temporal craniectomy. Electronically Signed   By: Ulyses Jarred M.D.   On: 04/29/2021 19:51   ECHOCARDIOGRAM LIMITED  Result Date: 04/30/2021    ECHOCARDIOGRAM LIMITED REPORT   Patient Name:   Maria Hudson Date of Exam: 04/30/2021 Medical Rec #:  [id number]      Height:       63.0  in Accession #:    [id number]     Weight:       222.0 lb Date of Birth:  [birth year]      BSA:          2.021 m Patient Age:    36 years       BP:           122/68 mmHg Patient Gender: F  HR:           56 bpm. Exam Location:  Inpatient Procedure: Limited Echo Indications:    Chest pain. possible pericarditis  History:        Patient has prior history of Echocardiogram examinations.                 Previous Myocardial Infarction.  Sonographer:    Merrie Roof RDCS Referring Phys: [place]  1. Left ventricular ejection fraction, by estimation, is 55 to 60%. The left ventricle has normal function. The left ventricle has no regional wall motion abnormalities. There is moderate left ventricular hypertrophy. Left ventricular diastolic parameters are indeterminate.  2. Right ventricular systolic function is normal. The right ventricular size is normal.  3. Left atrial size was mildly dilated.  4. The mitral valve is normal in structure. No evidence of mitral valve regurgitation. No evidence of mitral stenosis.  5. The aortic valve was not well visualized. Aortic valve regurgitation is not visualized. No aortic stenosis is present.  6. The inferior vena cava is normal in size with greater than 50% respiratory variability, suggesting right atrial pressure of 3 mmHg.  7. No significant pericardial effusion.  8. Limited echo. FINDINGS  Left Ventricle: Left ventricular ejection fraction, by estimation, is 55 to 60%. The left ventricle has normal function. The left ventricle has no regional wall motion abnormalities. The left ventricular internal cavity size was normal in size. There is  moderate left ventricular hypertrophy. Left ventricular diastolic parameters are indeterminate. Right Ventricle: The right ventricular size is normal. No increase in right ventricular wall thickness. Right ventricular systolic function is normal. Left Atrium: Left atrial size was mildly dilated. Right Atrium:  Right atrial size was normal in size. Pericardium: There is no evidence of pericardial effusion. Mitral Valve: The mitral valve is normal in structure. No evidence of mitral valve stenosis. Tricuspid Valve: The tricuspid valve is normal in structure. Tricuspid valve regurgitation is trivial. Aortic Valve: The aortic valve was not well visualized. Aortic valve regurgitation is not visualized. No aortic stenosis is present. Venous: The inferior vena cava is normal in size with greater than 50% respiratory variability, suggesting right atrial pressure of 3 mmHg. [employer] Electronically signed by Franki Monte Signature Date/Time: 04/30/2021/4:23:05 PM    Final     Medications:    amLODipine  10 mg Oral q morning   aspirin EC  81 mg Oral Daily   atorvastatin  20 mg Oral QPM   carvedilol  25 mg Oral BID WC   cloNIDine  0.3 mg Oral BID   diclofenac Sodium  2 g Topical QID   heparin  5,000 Units Subcutaneous Q8H   insulin aspart  0-15 Units Subcutaneous TID WC   insulin aspart  0-5 Units Subcutaneous QHS   pantoprazole  40 mg Oral Daily   sertraline  50 mg Oral Daily   Continuous Infusions:  methocarbamol (ROBAXIN) IV       LOS: 2 days   [name]  [facility]   How to contact the [facility] Attending or Consulting provider [place] or covering provider during after hours [place], for this patient?  Check the care team in [facility] and look for a) attending/consulting TRH provider listed and b) the [facility] team listed Log into [email or web address] and use [place]'s universal password to access. If you do not have the password, please contact the hospital operator. Locate the [facility] provider you are looking for  under [facility] and page to a number that you can be directly reached. If you still have difficulty reaching the provider, please page the [facility] (Director on Call) for the Hospitalists listed on amion for assistance.  05/01/2021, 2:15 PM

## 2021-05-02 DIAGNOSIS — Z794 Long term (current) use of insulin: Secondary | ICD-10-CM | POA: Diagnosis not present

## 2021-05-02 DIAGNOSIS — R079 Chest pain, unspecified: Secondary | ICD-10-CM

## 2021-05-02 DIAGNOSIS — E119 Type 2 diabetes mellitus without complications: Secondary | ICD-10-CM | POA: Diagnosis not present

## 2021-05-02 DIAGNOSIS — I5032 Chronic diastolic (congestive) heart failure: Secondary | ICD-10-CM

## 2021-05-02 DIAGNOSIS — E1122 Type 2 diabetes mellitus with diabetic chronic kidney disease: Secondary | ICD-10-CM | POA: Diagnosis not present

## 2021-05-02 DIAGNOSIS — R1013 Epigastric pain: Secondary | ICD-10-CM | POA: Diagnosis not present

## 2021-05-02 DIAGNOSIS — I951 Orthostatic hypotension: Secondary | ICD-10-CM | POA: Diagnosis not present

## 2021-05-02 DIAGNOSIS — Q245 Malformation of coronary vessels: Secondary | ICD-10-CM | POA: Diagnosis not present

## 2021-05-02 DIAGNOSIS — N1832 Chronic kidney disease, stage 3b: Secondary | ICD-10-CM | POA: Diagnosis not present

## 2021-05-02 DIAGNOSIS — N184 Chronic kidney disease, stage 4 (severe): Secondary | ICD-10-CM | POA: Diagnosis not present

## 2021-05-02 DIAGNOSIS — I10 Essential (primary) hypertension: Secondary | ICD-10-CM | POA: Diagnosis not present

## 2021-05-02 DIAGNOSIS — R0789 Other chest pain: Secondary | ICD-10-CM | POA: Diagnosis not present

## 2021-05-02 DIAGNOSIS — N179 Acute kidney failure, unspecified: Secondary | ICD-10-CM | POA: Diagnosis not present

## 2021-05-02 LAB — C-REACTIVE PROTEIN: CRP: 1.1 mg/dL — ABNORMAL HIGH (ref <1.0)

## 2021-05-02 LAB — BASIC METABOLIC PANEL
Anion gap: 9 (ref 5–15)
BUN: 72 mg/dL — ABNORMAL HIGH (ref 6–20)
CO2: 25 mmol/L (ref 22–32)
Calcium: 9.5 mg/dL (ref 8.9–10.3)
Chloride: 106 mmol/L (ref 98–111)
Creatinine, Ser: 2.79 mg/dL — ABNORMAL HIGH (ref 0.44–1.00)
GFR, Estimated: 19 mL/min — ABNORMAL LOW (ref >60)
Glucose, Bld: 168 mg/dL — ABNORMAL HIGH (ref 70–99)
Potassium: 3.7 mmol/L (ref 3.5–5.1)
Sodium: 140 mmol/L (ref 135–145)

## 2021-05-02 LAB — GLUCOSE, CAPILLARY
Glucose-Capillary: 189 mg/dL — ABNORMAL HIGH (ref 70–99)
Glucose-Capillary: 133 mg/dL — ABNORMAL HIGH (ref 70–99)
Glucose-Capillary: 187 mg/dL — ABNORMAL HIGH (ref 70–99)
Glucose-Capillary: 170 mg/dL — ABNORMAL HIGH (ref 70–99)

## 2021-05-02 MED ORDER — LACTULOSE 10 GM/15ML PO SOLN
10.0000 g | Freq: Two times a day (BID) | ORAL | Status: DC | PRN
  Administered 2021-05-02: 10 g via ORAL
  Filled 2021-05-02: qty 15

## 2021-05-02 MED ORDER — SPIRONOLACTONE 25 MG PO TABS
25.0000 mg | ORAL_TABLET | Freq: Every day | ORAL | Status: DC
  Administered 2021-05-02 – 2021-05-03 (×2): 25 mg via ORAL
  Filled 2021-05-02 (×2): qty 1

## 2021-05-02 MED ORDER — SUCRALFATE 1 GM/10ML PO SUSP
1.0000 g | Freq: Three times a day (TID) | ORAL | Status: DC
  Administered 2021-05-02 – 2021-05-03 (×4): 1 g via ORAL
  Filled 2021-05-02 (×4): qty 10

## 2021-05-02 MED ORDER — HYDRALAZINE HCL 20 MG/ML IJ SOLN: 5.0000 mg | Freq: Four times a day (QID) | INTRAMUSCULAR | Status: DC | PRN

## 2021-05-02 MED ORDER — CLONIDINE HCL 0.3 MG PO TABS
0.3000 mg | ORAL_TABLET | Freq: Three times a day (TID) | ORAL | Status: DC
  Administered 2021-05-02 – 2021-05-03 (×3): 0.3 mg via ORAL
  Filled 2021-05-02 (×3): qty 1

## 2021-05-02 NOTE — Progress Notes (Signed)
0100-Paged Dr. Johnell Comings about pt's BP 207/82 low dose of IV Hydralazine 5 mg IVP ordered as needed for pt. MD stated to closely monitor BP due to pt BP significantly dropping at times when changing positions.    0118-Upon arrival to pt's room found pt standing and BP checked at this time. BP 138/67. Pt denies any issues from a sitting to standing position. Pt did not received any Hydralazine at this time. Will continue to monitor BP closely.

## 2021-05-02 NOTE — Progress Notes (Signed)
Progress Note    Maria Hudson  QAS:[id number] DOB: August 30, 1960  DOA: 04/29/2021 PCP: Nolene Ebbs, MD    Brief Narrative:     Medical records reviewed and are as summarized below:  Maria Hudson is an 60 y.o. female  with medical history significant of diastolic CHF, CKD, DM2, HTN,CVA  OSA not on CPAP and pulmonary HTN.  Admitted for AKI likely secondary to overdiuresis and musculoskeletal chest pain. Has not yet taken the [place] started as an outpatient.    Assessment/Plan:   Active Problems:   Chest pain   T2DM (type 2 diabetes mellitus) (HCC)   CKD (chronic kidney disease) stage 4, GFR 15-29 ml/min (HCC)   Essential hypertension   AKI (acute kidney injury) (HCC)   Chronic diastolic CHF (congestive heart failure) (HCC)   AKI (acute kidney injury) ([place])  -likely secondary to overdiuresis  -hold diuretics  -s/p gentle IVF -has nephrology appointment on weds -labs in AM  Orthostatic hypotension -patient was less symptomatic today  Constipation -resume bowel reg  Essential hypertension  --resume meds as able- discussed with nephrology-- will change clonidine to TID to avoid rebound HTN -will need to allow for permissive supine BP as it drops when she gets up- ? Autonomic dsfxn due to DM?    CKD (chronic kidney disease) stage 4, GFR 15-29 ml/min (HCC) -   -avoid nephrotoxic medications such as NSAIDs, Vanco Zosyn combo - avoid hypotension  Chest pain  -initially thought to be musculoskeletal based on exam  -troponins unremarkable --echo stable, no fluid but continues to have pain- worse laying flat and better leaning forward -check sed rate -will ask cards to see -no rub detected   Chronic diastolic CHF (congestive heart failure) ([place])  -now appears to be slightly dry  - hold demadex   DM2.   -SSI  obesity Body mass index is 38.85 kg/m.   Family Communication/Anticipated D/C date and plan/Code Status   DVT prophylaxis: heparin Code Status:  Full Code.  Disposition Plan: Status is: Inpatient  Remains inpatient appropriate because: need for medication adjustments         Medical Consultants:   cards    Subjective:   Continues to have chest discomfort in certain positions  Objective:    Vitals:   05/02/21 0118 05/02/21 0408 05/02/21 0411 05/02/21 1145  BP: 138/67 (!) 169/67  (!) 187/82  Pulse: (!) 59 62  (!) 52  Resp:    18  Temp:  98 F (36.7 C)  98.1 F (36.7 C)  TempSrc:  Oral  Oral  SpO2:  99%  100%  Weight:   99.5 kg   Height:        Intake/Output Summary (Last 24 hours) at 05/02/2021 1503 Last data filed at 05/02/2021 1339 Gross per 24 hour  Intake 735 ml  Output --  Net 735 ml   Filed Weights   04/29/21 1820 05/01/21 0423 05/02/21 0411  Weight: 100.7 kg 99.3 kg 99.5 kg    Exam:   General: Appearance:    Obese female in no acute distress     Lungs:     respirations unlabored  Heart:    Bradycardic.   MS:   All extremities are intact.    Neurologic:   Awake, alert, oriented x 3       Data Reviewed:   I have personally reviewed following labs and imaging studies:  Labs: Labs show the following:   Basic Metabolic Panel: Recent Labs  Lab  04/29/21 1323 04/29/21 2226 04/30/21 0347 05/01/21 0247 05/02/21 0106  NA 136  --  138 136 140  K 3.9  --  3.6 3.6 3.7  CL 99  --  103 102 106  CO2 23  --  24 24 25   GLUCOSE 171*  --  147* 149* 168*  BUN 86*  --  84* 79* 72*  CREATININE 3.84*  --  3.60* 3.32* 2.79*  CALCIUM 9.0  --  9.1 8.5* 9.5  MG  --  2.2 2.2  --   --   PHOS  --  5.0* 4.7*  --   --    GFR Estimated Creatinine Clearance: 24.4 mL/min (A) (by C-G formula based on SCr of 2.79 mg/dL (H)). Liver Function Tests: Recent Labs  Lab 04/29/21 2226 04/30/21 0347  AST 13* 12*  ALT 9 8  ALKPHOS 44 45  BILITOT 0.6 0.7  PROT 6.1* 6.3*  ALBUMIN 3.1* 3.1*   No results for input(s): LIPASE, AMYLASE in the last 168 hours. No results for input(s): AMMONIA in the last  168 hours. Coagulation profile No results for input(s): INR, PROTIME in the last 168 hours.  CBC: Recent Labs  Lab 04/29/21 1323 04/29/21 2226 04/30/21 0347  WBC 5.4 4.9 5.2  NEUTROABS  --  2.8 3.2  HGB 10.8* 11.2* 10.7*  HCT 33.6* 34.7* 33.7*  MCV 82.8 83.8 83.4  PLT 242 222 226   Cardiac Enzymes: Recent Labs  Lab 04/29/21 2226  CKTOTAL 68   BNP (last 3 results) No results for input(s): PROBNP in the last 8760 hours. CBG: Recent Labs  Lab 05/01/21 1104 05/01/21 1625 05/01/21 2052 05/02/21 0637 05/02/21 1141  GLUCAP 203* 136* 153* 189* 133*   D-Dimer: Recent Labs    04/30/21 0816  DDIMER 0.51*   Hgb A1c: Recent Labs    04/29/21 2226  HGBA1C 6.7*   Lipid Profile: No results for input(s): CHOL, HDL, LDLCALC, TRIG, CHOLHDL, LDLDIRECT in the last 72 hours. Thyroid function studies: Recent Labs    04/30/21 0347  TSH 3.173   Anemia work up: No results for input(s): VITAMINB12, FOLATE, FERRITIN, TIBC, IRON, RETICCTPCT in the last 72 hours. Sepsis Labs: Recent Labs  Lab 04/29/21 1323 04/29/21 2226 04/30/21 0347  WBC 5.4 4.9 5.2    Microbiology Recent Results (from the past 240 hour(s))  Resp Panel by RT-PCR (Flu A&B, Covid) Nasopharyngeal Swab     Status: None   Collection Time: 04/29/21  6:51 PM   Specimen: Nasopharyngeal Swab; Nasopharyngeal(NP) swabs in vial transport medium  Result Value Ref Range Status   SARS Coronavirus 2 by RT PCR NEGATIVE NEGATIVE Final    Comment: (NOTE) SARS-CoV-2 target nucleic acids are NOT DETECTED.  The SARS-CoV-2 RNA is generally detectable in upper respiratory specimens during the acute phase of infection. The lowest concentration of SARS-CoV-2 viral copies this assay can detect is 138 copies/mL. A negative result does not preclude SARS-Cov-2 infection and should not be used as the sole basis for treatment or other patient management decisions. A negative result may occur with  improper specimen  collection/handling, submission of specimen other than nasopharyngeal swab, presence of viral mutation(s) within the areas targeted by this assay, and inadequate number of viral copies(<138 copies/mL). A negative result must be combined with clinical observations, patient history, and epidemiological information. The expected result is Negative.  Fact Sheet for Patients:  EntrepreneurPulse.com.au  Fact Sheet for Healthcare Providers:  IncredibleEmployment.be  This test is no t yet approved or cleared  by the [place] and  has been authorized for detection and/or diagnosis of SARS-CoV-2 by FDA under an Emergency Use Authorization (EUA). This EUA will remain  in effect (meaning this test can be used) for the duration of the COVID-19 declaration under Section 564(b)(1) of the Act, 21 U.S.C.section 360bbb-3(b)(1), unless the authorization is terminated  or revoked sooner.       Influenza A by PCR NEGATIVE NEGATIVE Final   Influenza B by PCR NEGATIVE NEGATIVE Final    Comment: (NOTE) The Xpert Xpress SARS-CoV-2/FLU/RSV plus assay is intended as an aid in the diagnosis of influenza from Nasopharyngeal swab specimens and should not be used as a sole basis for treatment. Nasal washings and aspirates are unacceptable for Xpert Xpress SARS-CoV-2/FLU/RSV testing.  Fact Sheet for Patients: EntrepreneurPulse.com.au  Fact Sheet for Healthcare Providers: IncredibleEmployment.be  This test is not yet approved or cleared by the [place] FDA and has been authorized for detection and/or diagnosis of SARS-CoV-2 by FDA under an Emergency Use Authorization (EUA). This EUA will remain in effect (meaning this test can be used) for the duration of the COVID-19 declaration under Section 564(b)(1) of the Act, 21 U.S.C. section 360bbb-3(b)(1), unless the authorization is terminated or revoked.  Performed at [facility], [place] [address], [place], [place] [address]   Urine Culture     Status: Abnormal   Collection Time: 04/30/21  3:29 AM   Specimen: Urine, Clean Catch  Result Value Ref Range Status   Specimen Description URINE, CLEAN CATCH  Final   Special Requests NONE  Final   Culture (A)  Final    80,000 COLONIES/mL LACTOBACILLUS SPECIES Standardized susceptibility testing for this organism is not available. Performed at [facility], [place] [address], [place], [place] [address]    Report Status 05/01/2021 FINAL  Final    Procedures and diagnostic studies:  No results found.  Medications:    amLODipine  10 mg Oral q morning   aspirin EC  81 mg Oral Daily   atorvastatin  20 mg Oral QPM   carvedilol  25 mg Oral BID WC   cloNIDine  0.3 mg Oral TID   diclofenac Sodium  2 g Topical QID   heparin  5,000 Units Subcutaneous Q8H   insulin aspart  0-15 Units Subcutaneous TID WC   insulin aspart  0-5 Units Subcutaneous QHS   insulin aspart  5 Units Subcutaneous TID WC   pantoprazole  40 mg Oral Daily   sertraline  50 mg Oral Daily   spironolactone  25 mg Oral Daily   sucralfate  1 g Oral TID WC & HS   Continuous Infusions:  methocarbamol (ROBAXIN) IV 500 mg (05/02/21 0011)     LOS: 3 days   [name]  [facility]   How to contact the [facility] Attending or Consulting provider [place] or covering provider during after hours [place], for this patient?  Check the care team in [facility] and look for a) attending/consulting TRH provider listed and b) the [facility] team listed Log into [email or web address] and use [place]'s universal password to access. If you do not have the password, please contact the hospital operator. Locate the [facility] provider you are looking for under [facility] and page to a number that you can be directly reached. If you still have difficulty reaching the provider, please page the [facility] (Director on Call) for the Hospitalists listed on amion for  assistance.  05/02/2021, 3:03  PM

## 2021-05-02 NOTE — Progress Notes (Signed)
Mobility Specialist Progress Note:   05/02/21 1110  Mobility  Activity Ambulated in hall  Level of Assistance Modified independent, requires aide device or extra time  Assistive Device Front wheel walker  Distance Ambulated (ft) 780 ft  Mobility Ambulated with assistance in hallway  Mobility Response Tolerated well  Mobility performed by Mobility specialist  $Mobility charge 1 Mobility   Pt received in bed willing to participate in mobility. No complaints of pain and asymptomatic.Pt returned to bed with call bell in reach and all needs met.   [facility] Public librarian Phone [phone] Secondary Phone [phone]

## 2021-05-02 NOTE — Consult Note (Addendum)
Cardiology Consultation:   Patient ID: Maria Hudson MRN: [id number]; DOB: July 24, 1960  Admit date: 04/29/2021 Date of Consult: 05/02/2021  PCP:  Nolene Ebbs, MD   [facility] Providers Cardiologist:  Buford Dresser, MD    Hypertension Clinic: Dr Oval Linsey   Patient Profile:   Maria Hudson is a 60 y.o. female with a hx of difficult to treat HTN, DM, CKD III, obesity, CVA, D-CHF with PYP scan at [facility] negative for amyloid, neuropathy, OSA, chronically occluded PDA with otherwise nonobstructive disease by cath 2014, who is being seen 05/02/2021 for the evaluation of CP and difficult to control hypertension at the request of Dr Eliseo Squires.  History of Present Illness:   Maria Hudson was previously followed by [facility] cardiology, last visit 07/2020.  She was hospitalized 9/21 - 03/07/2021 for chest pain, felt secondary to hypertension.  She was referred to Dr. Oval Linsey at the Hypertension Clinic.  She was also to follow-up with Nephrology.  She saw Dr. Oval Linsey on 9/28.  She reported orthostatic dizziness and frequent blood pressures greater than 893 systolic.  She was also having some chest pain with exertion.  There has also been some nausea.  She was reporting chest pain from wearing BiPAP.  She was also having problems with constipation.  In the clinic, her systolic blood pressure went from 209>> 120s, lying to standing.  It was felt this was the cause of her dizziness.  Of her symptoms were consistent with endocrine abnormalities.  An a.m. cortisol, renin, aldosterone, catecholamines and metanephrines were ordered.  Her PTH levels have also been very elevated.  Calcium levels were normal.  She was continued on Bumex, spironolactone discontinued due to worsening renal function.  10/31 Hypertension Clinic visit with labs reviewed.Thyroid function, renin, and aldosterone were normal, as were catecholamines and metanephrines. Her PTH was elevated at 134 but calcium was normal.  Her doxazosin  had been increased to 2 mg nightly.  She had put on 18 pounds in the last 2-1/2 weeks, felt secondary to fluid.  Dr. Justin Mend had started her on metolazone 2 tablets twice daily before her diuretics.  She was felt to be very volume overloaded on exam despite addition of metolazone and increasing her Bumex.  It was felt that she could not be managed as an outpatient.  She was admitted 10/31-11/06 for CHF exacerbation.  Meds adjusted, weight 106.9 kg (239 pounds) at discharge.  She was seen in the CHF [facility] clinic on 11/10.  Her weight was 231 pounds.  Still complaining of orthopnea and dyspnea with mild exertion.  She still was not on CPAP, sleep study arranged for 12/08.  She was started on Jardiance, repeat BMET in 1 week.  Continue diuretics, follow-up with Nephrology as scheduled.  Follow-up with [facility].  11/15, patient called the office saying she had been having chest pain on and off for 4 days.  She was sent to the ER.  She was admitted on 11/15 with chest pain, dizziness, and weakness.  She described being lightheaded every time she stood up.  She was admitted for AKI on CKD felt secondary to overdiuresis.  BUN/creatinine 79/3.32 on admission.  She had not yet started the Januvia.  SBP 170s >> 120s lying to standing.  She was hydrated.   Her cardiac enzymes were negative for MI, chest pain felt to be musculoskeletal.  A limited echo showed a preserved EF, no pericardial effusion no obvious signs of pericarditis.  Cards asked to see for chest pain, possible pericarditis, and  difficult to control hypertension.  Maria Hudson is compliant with her blood pressure medications.  She has been watching the salt very carefully.  She is not eating processed foods and does not use salt when cooking.  Her peak weight was 275 pounds.  On admission, she was 222, and is now 219 pounds.  She feels her respiratory status is at baseline.  She does not have lower extremity edema, denies orthopnea or PND.  She still has  orthostatic dizziness.  However, she was able to walk all the way down the hall and back today after getting her medications, and only got a very slight amount of dizziness.  This is something she can live with.  She did not feel in any danger of falling or losing consciousness.  The chest pain has been improving with external cream and IV Dilaudid.  However, today, it has improved to the point that it is down to a 3/10 from a 9/10.  They have been putting Voltaren gel on it.  She has not needed Dilaudid today, has only taken Tylenol.  It does seem to get a little bit worse when she takes a deep breath.  She is very careful to be compliant with her medications, she is aware that she has significant hypertension.  Her mother also has significant hypertension.   Past Medical History:  Diagnosis Date   Acute on chronic diastolic heart failure ([place]) 03/05/2021   CHF (congestive heart failure) (HCC)    CKD (chronic kidney disease) stage 4, GFR 15-29 ml/min ([place]) 03/05/2021   Diabetes mellitus without complication ([place])    Hypertension    Hypertensive urgency 04/14/2021   Renal disorder    Stroke [facility])     No past surgical history on file.   Home Medications:  Prior to Admission medications   Medication Sig Start Date End Date Taking? Authorizing Provider  acetaminophen (TYLENOL) 500 MG tablet Take 1,000 mg by mouth every 6 (six) hours as needed for moderate pain or headache.   Yes [provider]  allopurinol (ZYLOPRIM) 100 MG tablet Take 100 mg by mouth daily. 01/27/21  Yes [provider]  amLODipine (NORVASC) 10 MG tablet Take 10 mg by mouth every morning. 02/03/21  Yes [provider]  aspirin EC 81 MG tablet Take 81 mg by mouth daily. Swallow whole.   Yes [provider]  atorvastatin (LIPITOR) 20 MG tablet Take 20 mg by mouth every evening. 03/02/21  Yes [provider]  buPROPion (WELLBUTRIN) 100 MG tablet Take 100 mg by mouth 2 (two) times  daily. 02/04/21  Yes [provider]  carvedilol (COREG) 25 MG tablet Take 1 tablet (25 mg total) by mouth 2 (two) times daily with a meal. 03/07/21 04/29/21 Yes [name], [name], MD  chlorthalidone (HYGROTON) 50 MG tablet Take 1 tablet (50 mg total) by mouth daily. 04/21/21  Yes Eulogio Bear U, DO  cloNIDine (CATAPRES) 0.3 MG tablet Take 0.3 mg by mouth 2 (two) times daily.   Yes [provider]  doxazosin (CARDURA) 8 MG tablet Take 1 tablet (8 mg total) by mouth every evening. 04/20/21  Yes Eulogio Bear U, DO  ferrous sulfate 325 (65 FE) MG tablet Take 325 mg by mouth 2 (two) times daily. 02/24/21  Yes [provider]  fluticasone (FLONASE) 50 MCG/ACT nasal spray Place 1 spray into both nostrils daily as needed for allergies or rhinitis.   Yes [provider]  hydrALAZINE (APRESOLINE) 100 MG tablet Take 1 tablet (  100 mg total) by mouth 3 (three) times daily. 03/12/21  Yes Skeet Latch, MD  insulin glargine (LANTUS) 100 unit/mL SOPN Inject 20 Units into the skin at bedtime as needed (if blood sugar is over 150).   Yes [provider]  isosorbide mononitrate (IMDUR) 60 MG 24 hr tablet Take 60 mg by mouth daily. 04/25/21  Yes [provider]  loratadine (CLARITIN) 10 MG tablet Take 10 mg by mouth daily. 02/14/21  Yes [provider]  Multiple Vitamin (MULTIVITAMIN ADULT) TABS Take 1 tablet by mouth daily.   Yes [provider]  nitroGLYCERIN (NITROSTAT) 0.4 MG SL tablet Place 1 tablet (0.4 mg total) under the tongue every 5 (five) minutes as needed for chest pain. 03/07/21  Yes [name], [name], MD  NOVOLOG FLEXPEN 100 UNIT/ML FlexPen Inject 8 Units into the skin 3 (three) times daily with meals. 01/12/21  Yes [provider]  Omega-3 Fatty Acids (FISH OIL) 1000 MG CAPS Take 1 capsule by mouth daily.   Yes [provider]  pantoprazole (PROTONIX) 40 MG tablet Take 1 tablet (40 mg total) by mouth daily.  04/20/21  Yes Vann, Jessica U, DO  pregabalin (LYRICA) 25 MG capsule Take 25 mg by mouth 2 (two) times daily. 02/03/21  Yes [provider]  senna (SENOKOT) 8.6 MG TABS tablet Take 2 tablets (17.2 mg total) by mouth daily. 04/21/21  Yes Vann, Jessica U, DO  sertraline (ZOLOFT) 50 MG tablet Take 50 mg by mouth daily. 02/01/21  Yes [provider]  spironolactone (ALDACTONE) 25 MG tablet Take 1 tablet (25 mg total) by mouth daily. 04/21/21  Yes Vann, Jessica U, DO  torsemide (DEMADEX) 20 MG tablet Take 40 mg by mouth daily. 04/23/21  Yes [provider]  traZODone (DESYREL) 50 MG tablet Take 50 mg by mouth at bedtime. 04/23/21  Yes [provider]  empagliflozin (JARDIANCE) 10 MG TABS tablet Take 1 tablet (10 mg total) by mouth daily before breakfast. 04/24/21   Lyda Jester M, PA-C  isosorbide mononitrate (IMDUR) 120 MG 24 hr tablet Take 1 tablet (120 mg total) by mouth daily. Patient taking differently: Take 120 mg by mouth daily. Pt stated still taking at home 04/21/21   Eulogio Bear U, DO  polyethylene glycol (MIRALAX / GLYCOLAX) 17 g packet Take 17 g by mouth daily. Patient not taking: Reported on 04/29/2021 03/07/21   [name], [name], MD  torsemide 40 MG TABS Take 40 mg by mouth daily. Patient not taking: Reported on 04/29/2021 04/21/21   [name], DO    Inpatient Medications: Scheduled Meds:  amLODipine  10 mg Oral q morning   aspirin EC  81 mg Oral Daily   atorvastatin  20 mg Oral QPM   carvedilol  25 mg Oral BID WC   cloNIDine  0.3 mg Oral TID   diclofenac Sodium  2 g Topical QID   heparin  5,000 Units Subcutaneous Q8H   insulin aspart  0-15 Units Subcutaneous TID WC   insulin aspart  0-5 Units Subcutaneous QHS   insulin aspart  5 Units Subcutaneous TID WC   pantoprazole  40 mg Oral Daily   sertraline  50 mg Oral Daily   spironolactone  25 mg Oral Daily   sucralfate  1 g Oral TID WC & HS   Continuous Infusions:  methocarbamol  (ROBAXIN) IV 500 mg (05/02/21 0011)   PRN Meds: acetaminophen **OR** acetaminophen, hydrALAZINE, HYDROmorphone (DILAUDID) injection, insulin glargine-yfgn, lactulose, methocarbamol (ROBAXIN) IV, simethicone  Allergies:    Allergies  Allergen Reactions   Hydrocodone Itching    Patient able to tolerate when taken with Benadryl.   Oxycodone-Acetaminophen Itching    Patient able to tolerate when taken with Benadryl.    Social History:   Social History   Socioeconomic History   Marital status: Divorced    Spouse name: Not on file   Number of children: 2   Years of education: Not on file   Highest education level: Some college, no degree  Occupational History   Occupation: retired  Tobacco Use   Smoking status: Never    Passive exposure: Never   Smokeless tobacco: Never  Vaping Use   Vaping Use: Never used  Substance and Sexual Activity   Alcohol use: Not Currently   Drug use: Not Currently   Sexual activity: Not on file  Other Topics Concern   Not on file  Social History Narrative   Not on file   Social Determinants of Health   Financial Resource Strain: Low Risk    Difficulty of Paying Living Expenses: Not very hard  Food Insecurity: No Food Insecurity   Worried About Charity fundraiser in the Last Year: Never true   [place] in the Last Year: Never true  Transportation Needs: No Transportation Needs   Lack of Transportation (Medical): No   Lack of Transportation (Non-Medical): No  Physical Activity: Not on file  Stress: Not on file  Social Connections: Not on file  Intimate Partner Violence: Not on file    Family History:   Family History  Problem Relation Age of Onset   Hypertension Mother    Stroke Mother    Hypertension Father    Hypertension Sister    Hypertension Son    Hypertension Daughter      ROS:  Please see the history of present illness.  All other ROS reviewed and negative.     Physical Exam/Data:   Vitals:   05/02/21 0118  05/02/21 0408 05/02/21 0411 05/02/21 1145  BP: 138/67 (!) 169/67  (!) 187/82  Pulse: (!) 59 62  (!) 52  Resp:    18  Temp:  98 F (36.7 C)  98.1 F (36.7 C)  TempSrc:  Oral  Oral  SpO2:  99%  100%  Weight:   99.5 kg   Height:        Intake/Output Summary (Last 24 hours) at 05/02/2021 1742 Last data filed at 05/02/2021 1339 Gross per 24 hour  Intake 495 ml  Output --  Net 495 ml   Last 3 Weights 05/02/2021 05/01/2021 04/29/2021  Weight (lbs) 219 lb 4.8 oz 218 lb 14.7 oz 222 lb  Weight (kg) 99.474 kg 99.3 kg 100.699 kg     Body mass index is 38.85 kg/m.  General:  Well nourished, well developed, in no acute distress HEENT: normal Neck: no JVD Vascular: No carotid bruits; Distal pulses 2+ bilaterally Cardiac:  normal S1, S2; RRR; soft murmur  Lungs:  clear to auscultation bilaterally, no wheezing, rhonchi or rales  Abd: soft, nontender, no hepatomegaly  Ext: no edema Musculoskeletal:  No deformities, BUE and BLE strength normal and equal Skin: warm and dry  Neuro:  CNs 2-12 intact, no focal abnormalities noted Psych:  Normal affect   EKG:  The EKG was personally reviewed and demonstrates: Sinus rhythm, HR 72, LVH, J-point elevation V1- V2 Telemetry:  Telemetry was personally reviewed and demonstrates: Sinus rhythm, sinus bradycardia to the high 40s at  times  Relevant CV Studies:  ECHO LIMITED: 04/30/2021  1. Left ventricular ejection fraction, by estimation, is 55 to 60%. The  left ventricle has normal function. The left ventricle has no regional  wall motion abnormalities. There is moderate left ventricular hypertrophy. Left ventricular diastolic parameters are indeterminate.   2. Right ventricular systolic function is normal. The right ventricular  size is normal.   3. Left atrial size was mildly dilated.   4. The mitral valve is normal in structure. No evidence of mitral valve regurgitation. No evidence of mitral stenosis.   5. The aortic valve was not well  visualized. Aortic valve regurgitation is not visualized. No aortic stenosis is present.   6. The inferior vena cava is normal in size with greater than 50%  respiratory variability, suggesting right atrial pressure of 3 mmHg.   7. No significant pericardial effusion.   8. Limited echo.   Echo 03/06/2021: 1. Left ventricular ejection fraction, by estimation, is 55 to 60%. The  left ventricle has normal function. The left ventricle has no regional  wall motion abnormalities. There is mild left ventricular hypertrophy.  Left ventricular diastolic parameters  are consistent with Grade I diastolic dysfunction (impaired relaxation).  Elevated left atrial pressure.   2. Right ventricular systolic function is normal. The right ventricular  size is normal.   3. Left atrial size was mildly dilated.   4. The mitral valve is normal in structure. Trivial mitral valve  regurgitation. No evidence of mitral stenosis.   5. The aortic valve is tricuspid. Aortic valve regurgitation is not  visualized. No aortic stenosis is present.   6. The inferior vena cava is normal in size with greater than 50%  respiratory variability, suggesting right atrial pressure of 3 mmHg.   LHC 05/2013:  Ostium: normal Successful Catheter: 5 fr. JL3.5 Unsuccessful Catheters: none  LAD: 20% mid lesion   Circumflex: 50-60% in a large branch of a large Ramus Intermedius; otherwise, no significant LCx disease   RCA: 30% mid; 100% rPDA with L-->R collaterals   Laboratory Data:  High Sensitivity Troponin:   Recent Labs  Lab 04/29/21 1323 04/29/21 1515  TROPONINIHS 16 16     Chemistry Recent Labs  Lab 04/29/21 2226 04/30/21 0347 05/01/21 0247 05/02/21 0106  NA  --  138 136 140  K  --  3.6 3.6 3.7  CL  --  103 102 106  CO2  --  24 24 25   GLUCOSE  --  147* 149* 168*  BUN  --  84* 79* 72*  CREATININE  --  3.60* 3.32* 2.79*  CALCIUM  --  9.1 8.5* 9.5  MG 2.2 2.2  --   --   GFRNONAA  --  14* 15* 19*  ANIONGAP   --  11 10 9     Recent Labs  Lab 04/29/21 2226 04/30/21 0347  PROT 6.1* 6.3*  ALBUMIN 3.1* 3.1*  AST 13* 12*  ALT 9 8  ALKPHOS 44 45  BILITOT 0.6 0.7   Lipids No results for input(s): CHOL, TRIG, HDL, LABVLDL, LDLCALC, CHOLHDL in the last 168 hours.  Hematology Recent Labs  Lab 04/29/21 1323 04/29/21 2226 04/30/21 0347  WBC 5.4 4.9 5.2  RBC 4.06 4.14 4.04  HGB 10.8* 11.2* 10.7*  HCT 33.6* 34.7* 33.7*  MCV 82.8 83.8 83.4  MCH 26.6 27.1 26.5  MCHC 32.1 32.3 31.8  RDW 17.2* 17.3* 17.1*  PLT 242 222 226   Thyroid  Recent Labs  Lab 04/30/21  0347  TSH 3.173    BNPNo results for input(s): BNP, PROBNP in the last 168 hours.  DDimer  Recent Labs  Lab 04/30/21 0816  DDIMER 0.51*     Radiology/Studies:  DG Chest 2 View  Result Date: 04/29/2021 CLINICAL DATA:  generalized chest pain EXAM: CHEST - 2 VIEW COMPARISON:  04/14/2021 FINDINGS: Normal mediastinum and cardiac silhouette. Normal pulmonary vasculature. No evidence of effusion, infiltrate, or pneumothorax. No acute bony abnormality. IMPRESSION: No acute cardiopulmonary process. Electronically Signed   By: Suzy Bouchard M.D.   On: 04/29/2021 19:31   CT HEAD WO CONTRAST (5MM)  Result Date: 04/29/2021 CLINICAL DATA:  Headache EXAM: CT HEAD WITHOUT CONTRAST TECHNIQUE: Contiguous axial images were obtained from the base of the skull through the vertex without intravenous contrast. COMPARISON:  None. FINDINGS: Brain: There is no mass, hemorrhage or extra-axial collection. The size and configuration of the ventricles and extra-axial CSF spaces are normal. There is hypoattenuation of the white matter, most commonly indicating chronic small vessel disease. Vascular: Atherosclerotic calcification of the vertebral and internal carotid arteries at the skull base. No abnormal hyperdensity of the major intracranial arteries or dural venous sinuses. Skull: Remote right temporal craniectomy Sinuses/Orbits: No fluid levels or advanced  mucosal thickening of the visualized paranasal sinuses. No mastoid or middle ear effusion. The orbits are normal. IMPRESSION: 1. Chronic small vessel disease without acute intracranial abnormality. 2. Remote right temporal craniectomy. Electronically Signed   By: Ulyses Jarred M.D.   On: 04/29/2021 19:51   ECHOCARDIOGRAM LIMITED  Result Date: 04/30/2021    ECHOCARDIOGRAM LIMITED REPORT   Patient Name:   Maria Hudson Date of Exam: 04/30/2021 Medical Rec #:  [id number]      Height:       63.0 in Accession #:    [id number]     Weight:       222.0 lb Date of Birth:  1960-08-03      BSA:          2.021 m Patient Age:    63 years       BP:           122/68 mmHg Patient Gender: F              HR:           56 bpm. Exam Location:  Inpatient Procedure: Limited Echo Indications:    Chest pain. possible pericarditis  History:        Patient has prior history of Echocardiogram examinations.                 Previous Myocardial Infarction.  Sonographer:    Merrie Roof RDCS Referring Phys: [place]  1. Left ventricular ejection fraction, by estimation, is 55 to 60%. The left ventricle has normal function. The left ventricle has no regional wall motion abnormalities. There is moderate left ventricular hypertrophy. Left ventricular diastolic parameters are indeterminate.  2. Right ventricular systolic function is normal. The right ventricular size is normal.  3. Left atrial size was mildly dilated.  4. The mitral valve is normal in structure. No evidence of mitral valve regurgitation. No evidence of mitral stenosis.  5. The aortic valve was not well visualized. Aortic valve regurgitation is not visualized. No aortic stenosis is present.  6. The inferior vena cava is normal in size with greater than 50% respiratory variability, suggesting right atrial pressure of 3 mmHg.  7. No significant pericardial effusion.  8. Limited echo. FINDINGS  Left Ventricle: Left ventricular ejection fraction, by estimation,  is 55 to 60%. The left ventricle has normal function. The left ventricle has no regional wall motion abnormalities. The left ventricular internal cavity size was normal in size. There is  moderate left ventricular hypertrophy. Left ventricular diastolic parameters are indeterminate. Right Ventricle: The right ventricular size is normal. No increase in right ventricular wall thickness. Right ventricular systolic function is normal. Left Atrium: Left atrial size was mildly dilated. Right Atrium: Right atrial size was normal in size. Pericardium: There is no evidence of pericardial effusion. Mitral Valve: The mitral valve is normal in structure. No evidence of mitral valve stenosis. Tricuspid Valve: The tricuspid valve is normal in structure. Tricuspid valve regurgitation is trivial. Aortic Valve: The aortic valve was not well visualized. Aortic valve regurgitation is not visualized. No aortic stenosis is present. Venous: The inferior vena cava is normal in size with greater than 50% respiratory variability, suggesting right atrial pressure of 3 mmHg. [employer] Electronically signed by Franki Monte Signature Date/Time: 04/30/2021/4:23:05 PM    Final      Assessment and Plan:   Chest pain: -Cardiac enzymes are negative for MI. - She has a chronically occluded PDA, but does not routinely have anginal symptoms - She was able to ambulate in the halls without chest pain or shortness of breath today - She reports her symptoms have improved with Voltaren gel and Dilaudid, she now feels much better. - There is a pleuritic component to her symptoms as well. - She is on high-dose Imdur, and tolerating this well. - At this time, recommend continued medical therapy for her small vessel coronary artery disease. - Continue Voltaren gel and Tylenol, follow-up as directed  2.  Labile hypertension: -She has a long history of labile hypertension and orthostatic symptoms, but no orthostatic vital signs are  charted from this admission. - She is symptomatically improved -SBP range 138-191 in the last 24 hours - Feel this is not a problem with a quick fix - Dr. Oval Linsey knows her well.  She has a follow-up with Dr. Oval Linsey on December 13 - She is on amlodipine 10 mg daily, Coreg 25 mg twice daily (asymptomatic with sinus bradycardia in the high 40s),  spironolactone 25 mg daily - At home, she was also on chlorthalidone 50 mg a day, Cardura 8 mg a day, hydralazine 100 mg 3 times daily, Imdur 120 mg a day, and torsemide 40 mg a day -The above listed medicines were not ordered on admission - She was on clonidine at home, but the dose was 0.3 mg twice daily and it has been increased to 3 times daily. - Medication adjustments per MD  3.  Chronic diastolic CHF: - She was on torsemide 40 mg daily at home and this has been a longstanding medication. - At one point she was on metolazone to help with diuresis, but this was discontinued - She has been off her torsemide since admission, her creatinine was higher than usual on admission at 3.6 and has come down to 2.79 - Discuss with MD if she should resume the torsemide at full dose, or take 20 mg daily with an extra 20 mg daily for weight gain  4.  CKD IV: - Her creatinine is normally in the low to mid twos. - It was 3.60 on admission. - Her diuretics have been held, her creatinine has improved - She follows with Nephrology for diuretic management - She should make a follow-up appointment with them  as soon as possible to continue to follow her renal function closely  Otherwise, per IM   Risk Assessment/Risk Scores:     HEAR Score (for undifferentiated chest pain):  HEAR Score: 3   For questions or updates, please contact [facility] Please consult [email or web address] for contact info under    Signed, Rosaria Ferries, PA-C  05/02/2021 5:42 PM

## 2021-05-02 NOTE — Progress Notes (Signed)
Pt complained earlier in shift of epigastric pain observed pt belching and passing gas at times. Pt given Mylicon with no relief. After getting pain meds and muscle relaxer pt states pain has subsided. Will continue to monitor this with pt.

## 2021-05-03 DIAGNOSIS — Z794 Long term (current) use of insulin: Secondary | ICD-10-CM | POA: Diagnosis not present

## 2021-05-03 DIAGNOSIS — E1122 Type 2 diabetes mellitus with diabetic chronic kidney disease: Secondary | ICD-10-CM | POA: Diagnosis not present

## 2021-05-03 DIAGNOSIS — R1013 Epigastric pain: Secondary | ICD-10-CM

## 2021-05-03 DIAGNOSIS — R079 Chest pain, unspecified: Secondary | ICD-10-CM | POA: Diagnosis not present

## 2021-05-03 DIAGNOSIS — I10 Essential (primary) hypertension: Secondary | ICD-10-CM | POA: Diagnosis not present

## 2021-05-03 DIAGNOSIS — N184 Chronic kidney disease, stage 4 (severe): Secondary | ICD-10-CM | POA: Diagnosis not present

## 2021-05-03 DIAGNOSIS — N179 Acute kidney failure, unspecified: Secondary | ICD-10-CM | POA: Diagnosis not present

## 2021-05-03 DIAGNOSIS — N1832 Chronic kidney disease, stage 3b: Secondary | ICD-10-CM | POA: Diagnosis not present

## 2021-05-03 DIAGNOSIS — I951 Orthostatic hypotension: Secondary | ICD-10-CM | POA: Diagnosis not present

## 2021-05-03 LAB — CBC
HCT: 32.9 % — ABNORMAL LOW (ref 36.0–46.0)
Hemoglobin: 10.5 g/dL — ABNORMAL LOW (ref 12.0–15.0)
MCH: 26.6 pg (ref 26.0–34.0)
MCHC: 31.9 g/dL (ref 30.0–36.0)
MCV: 83.5 fL (ref 80.0–100.0)
Platelets: 211 10*3/uL (ref 150–400)
RBC: 3.94 MIL/uL (ref 3.87–5.11)
RDW: 16.9 % — ABNORMAL HIGH (ref 11.5–15.5)
WBC: 5.4 10*3/uL (ref 4.0–10.5)
nRBC: 0 % (ref 0.0–0.2)

## 2021-05-03 LAB — GLUCOSE, CAPILLARY
Glucose-Capillary: 120 mg/dL — ABNORMAL HIGH (ref 70–99)
Glucose-Capillary: 177 mg/dL — ABNORMAL HIGH (ref 70–99)
Glucose-Capillary: 208 mg/dL — ABNORMAL HIGH (ref 70–99)

## 2021-05-03 LAB — BASIC METABOLIC PANEL
Anion gap: 9 (ref 5–15)
BUN: 62 mg/dL — ABNORMAL HIGH (ref 6–20)
CO2: 23 mmol/L (ref 22–32)
Calcium: 9 mg/dL (ref 8.9–10.3)
Chloride: 107 mmol/L (ref 98–111)
Creatinine, Ser: 2.62 mg/dL — ABNORMAL HIGH (ref 0.44–1.00)
GFR, Estimated: 20 mL/min — ABNORMAL LOW (ref >60)
Glucose, Bld: 202 mg/dL — ABNORMAL HIGH (ref 70–99)
Potassium: 3.8 mmol/L (ref 3.5–5.1)
Sodium: 139 mmol/L (ref 135–145)

## 2021-05-03 MED ORDER — OXYCODONE HCL 5 MG PO TABS: 5.0000 mg | ORAL_TABLET | Freq: Three times a day (TID) | ORAL | 0 refills | Status: AC | PRN

## 2021-05-03 MED ORDER — DICLOFENAC SODIUM 1 % EX GEL: 2.0000 g | Freq: Four times a day (QID) | CUTANEOUS | Status: DC

## 2021-05-03 MED ORDER — CLONIDINE HCL 0.3 MG PO TABS: 0.3000 mg | ORAL_TABLET | Freq: Three times a day (TID) | ORAL | 0 refills | Status: DC

## 2021-05-03 MED ORDER — COVID-19MRNA BIVAL VACC PFIZER 30 MCG/0.3ML IM SUSP
0.3000 mL | Freq: Once | INTRAMUSCULAR | Status: AC
  Administered 2021-05-03: 0.3 mL via INTRAMUSCULAR
  Filled 2021-05-03: qty 0.3

## 2021-05-03 MED ORDER — SUCRALFATE 1 GM/10ML PO SUSP: 1.0000 g | Freq: Three times a day (TID) | ORAL | 0 refills | Status: DC

## 2021-05-03 NOTE — Progress Notes (Signed)
Patient discharged: Home with family.  Left via: Wheelchair  Discharge paperwork reviewed and given: to patient and family. Teach back completed. IV and telemetry disconnected. Belongings given to patient.  

## 2021-05-03 NOTE — Progress Notes (Signed)
    Dr Doug Sou consult note reviewed, please see note for full cardiac evaluation. Thought to be msk related chest pain, no plans for additional cardiac testing, pain management per primary team. No Complex history of labile HTN followed by HTN specialist Dr Oval Linsey and can continued to be followed and managed as outpatient.       Merrily Pew, MD  05/03/2021, 10:27 AM

## 2021-05-03 NOTE — Discharge Summary (Signed)
Physician Discharge Summary  Maria Hudson BSW:[id number] DOB: October 01, 1960 DOA: 04/29/2021  PCP: Nolene Ebbs, MD  Admit date: 04/29/2021 Discharge date: 05/03/2021  Admitted From: home Discharge disposition: home   Recommendations for Outpatient Follow-Up:   Adjust meds as able BMP at next visit Monitor orthostatics Needs thigh high TED hose GI referral placed-- needs EGD   Discharge Diagnosis:   Active Problems:   Chest pain   T2DM (type 2 diabetes mellitus) ([place])   CKD (chronic kidney disease) stage 4, GFR 15-29 ml/min (HCC)   Essential hypertension   AKI (acute kidney injury) ([place])   Chronic diastolic CHF (congestive heart failure) ([place])    Discharge Condition: Improved.  Diet recommendation: Low sodium, heart healthy.  Carbohydrate-modified.    Wound care: None.  Code status: Full.   History of Present Illness:   Maria Hudson is an 60 y.o. female  with medical history significant of diastolic CHF, CKD, DM2, HTN,CVA  OSA not on CPAP and pulmonary HTN.  Admitted for AKI likely secondary to overdiuresis and musculoskeletal chest pain. Has not yet taken the [place] started as an outpatient.       Hospital Course by Problem:   AKI (acute kidney injury) ([place])  -likely secondary to overdiuresis  -hold demadex, resume aldactone -s/p gentle IVF -has nephrology appointment on weds and cards on Monday   Orthostatic hypotension -patient was not symptomatic today Consider thigh high TED hose   Constipation -resume bowel reg -outpatient GI follow up   Essential hypertension  --resume meds as able- discussed with nephrology-- will change clonidine to TID to avoid rebound HTN -will need to allow for permissive supine BP as it drops when she gets up- ? Autonomic dsfxn due to DM?    CKD (chronic kidney disease) stage 4, GFR 15-29 ml/min (HCC) -   -avoid nephrotoxic medications such as NSAIDs, Vanco Zosyn combo - avoid hypotension   Chest pain   -initially thought to be musculoskeletal based on exam  -troponins unremarkable --echo stable, no fluid but continues to have pain- worse laying flat and better leaning forward -no indication of pericarditis- seen by cards -? GI-- referral made to GI for EGD   Chronic diastolic CHF (congestive heart failure) ([place])  -now appears to be slightly dry  - hold demadex   DM2.   -SSI   obesity Body mass index is 38.85 kg/m.      Medical Consultants:   cards   Discharge Exam:   Vitals:   05/03/21 0413 05/03/21 1113  BP: (!) 192/70 (!) 177/68  Pulse: (!) 50 (!) 50  Resp: 20 20  Temp: 97.9 F (36.6 C) 98.8 F (37.1 C)  SpO2: 100% 100%   Vitals:   05/02/21 1145 05/02/21 1956 05/03/21 0413 05/03/21 1113  BP: (!) 187/82 (!) 161/67 (!) 192/70 (!) 177/68  Pulse: (!) 52 81 (!) 50 (!) 50  Resp: 18 20 20 20   Temp: 98.1 F (36.7 C) 98.3 F (36.8 C) 97.9 F (36.6 C) 98.8 F (37.1 C)  TempSrc: Oral Oral Oral Oral  SpO2: 100% 99% 100% 100%  Weight:   99.4 kg   Height:        General exam: Appears calm and comfortable.    The results of significant diagnostics from this hospitalization (including imaging, microbiology, ancillary and laboratory) are listed below for reference.     Procedures and Diagnostic Studies:   DG Chest 2 View  Result Date: 04/29/2021 CLINICAL DATA:  generalized chest pain  EXAM: CHEST - 2 VIEW COMPARISON:  04/14/2021 FINDINGS: Normal mediastinum and cardiac silhouette. Normal pulmonary vasculature. No evidence of effusion, infiltrate, or pneumothorax. No acute bony abnormality. IMPRESSION: No acute cardiopulmonary process. Electronically Signed   By: Suzy Bouchard M.D.   On: 04/29/2021 19:31   CT HEAD WO CONTRAST (5MM)  Result Date: 04/29/2021 CLINICAL DATA:  Headache EXAM: CT HEAD WITHOUT CONTRAST TECHNIQUE: Contiguous axial images were obtained from the base of the skull through the vertex without intravenous contrast. COMPARISON:  None.  FINDINGS: Brain: There is no mass, hemorrhage or extra-axial collection. The size and configuration of the ventricles and extra-axial CSF spaces are normal. There is hypoattenuation of the white matter, most commonly indicating chronic small vessel disease. Vascular: Atherosclerotic calcification of the vertebral and internal carotid arteries at the skull base. No abnormal hyperdensity of the major intracranial arteries or dural venous sinuses. Skull: Remote right temporal craniectomy Sinuses/Orbits: No fluid levels or advanced mucosal thickening of the visualized paranasal sinuses. No mastoid or middle ear effusion. The orbits are normal. IMPRESSION: 1. Chronic small vessel disease without acute intracranial abnormality. 2. Remote right temporal craniectomy. Electronically Signed   By: Ulyses Jarred M.D.   On: 04/29/2021 19:51   ECHOCARDIOGRAM LIMITED  Result Date: 04/30/2021    ECHOCARDIOGRAM LIMITED REPORT   Patient Name:   Maria Hudson Date of Exam: 04/30/2021 Medical Rec #:  [id number]      Height:       63.0 in Accession #:    [id number]     Weight:       222.0 lb Date of Birth:  [birth year]      BSA:          2.021 m Patient Age:    49 years       BP:           122/68 mmHg Patient Gender: F              HR:           56 bpm. Exam Location:  Inpatient Procedure: Limited Echo Indications:    Chest pain. possible pericarditis  History:        Patient has prior history of Echocardiogram examinations.                 Previous Myocardial Infarction.  Sonographer:    Merrie Roof RDCS Referring Phys: [place]  1. Left ventricular ejection fraction, by estimation, is 55 to 60%. The left ventricle has normal function. The left ventricle has no regional wall motion abnormalities. There is moderate left ventricular hypertrophy. Left ventricular diastolic parameters are indeterminate.  2. Right ventricular systolic function is normal. The right ventricular size is normal.  3. Left atrial size  was mildly dilated.  4. The mitral valve is normal in structure. No evidence of mitral valve regurgitation. No evidence of mitral stenosis.  5. The aortic valve was not well visualized. Aortic valve regurgitation is not visualized. No aortic stenosis is present.  6. The inferior vena cava is normal in size with greater than 50% respiratory variability, suggesting right atrial pressure of 3 mmHg.  7. No significant pericardial effusion.  8. Limited echo. FINDINGS  Left Ventricle: Left ventricular ejection fraction, by estimation, is 55 to 60%. The left ventricle has normal function. The left ventricle has no regional wall motion abnormalities. The left ventricular internal cavity size was normal in size. There is  moderate left ventricular hypertrophy. Left ventricular diastolic parameters  are indeterminate. Right Ventricle: The right ventricular size is normal. No increase in right ventricular wall thickness. Right ventricular systolic function is normal. Left Atrium: Left atrial size was mildly dilated. Right Atrium: Right atrial size was normal in size. Pericardium: There is no evidence of pericardial effusion. Mitral Valve: The mitral valve is normal in structure. No evidence of mitral valve stenosis. Tricuspid Valve: The tricuspid valve is normal in structure. Tricuspid valve regurgitation is trivial. Aortic Valve: The aortic valve was not well visualized. Aortic valve regurgitation is not visualized. No aortic stenosis is present. Venous: The inferior vena cava is normal in size with greater than 50% respiratory variability, suggesting right atrial pressure of 3 mmHg. Dalton McleanMD Electronically signed by Franki Monte Signature Date/Time: 04/30/2021/4:23:05 PM    Final      Labs:   Basic Metabolic Panel: Recent Labs  Lab 04/29/21 1323 04/29/21 2226 04/30/21 0347 05/01/21 0247 05/02/21 0106 05/03/21 0216  NA 136  --  138 136 140 139  K 3.9  --  3.6 3.6 3.7 3.8  CL 99  --  103 102 106 107   CO2 23  --  24 24 25 23   GLUCOSE 171*  --  147* 149* 168* 202*  BUN 86*  --  84* 79* 72* 62*  CREATININE 3.84*  --  3.60* 3.32* 2.79* 2.62*  CALCIUM 9.0  --  9.1 8.5* 9.5 9.0  MG  --  2.2 2.2  --   --   --   PHOS  --  5.0* 4.7*  --   --   --    GFR Estimated Creatinine Clearance: 26 mL/min (A) (by C-G formula based on SCr of 2.62 mg/dL (H)). Liver Function Tests: Recent Labs  Lab 04/29/21 2226 04/30/21 0347  AST 13* 12*  ALT 9 8  ALKPHOS 44 45  BILITOT 0.6 0.7  PROT 6.1* 6.3*  ALBUMIN 3.1* 3.1*   No results for input(s): LIPASE, AMYLASE in the last 168 hours. No results for input(s): AMMONIA in the last 168 hours. Coagulation profile No results for input(s): INR, PROTIME in the last 168 hours.  CBC: Recent Labs  Lab 04/29/21 1323 04/29/21 2226 04/30/21 0347 05/03/21 0216  WBC 5.4 4.9 5.2 5.4  NEUTROABS  --  2.8 3.2  --   HGB 10.8* 11.2* 10.7* 10.5*  HCT 33.6* 34.7* 33.7* 32.9*  MCV 82.8 83.8 83.4 83.5  PLT 242 222 226 211   Cardiac Enzymes: Recent Labs  Lab 04/29/21 2226  CKTOTAL 68   BNP: Invalid input(s): POCBNP CBG: Recent Labs  Lab 05/02/21 1554 05/02/21 2109 05/03/21 0006 05/03/21 0529 05/03/21 1210  GLUCAP 187* 170* 177* 208* 120*   D-Dimer No results for input(s): DDIMER in the last 72 hours. Hgb A1c No results for input(s): HGBA1C in the last 72 hours. Lipid Profile No results for input(s): CHOL, HDL, LDLCALC, TRIG, CHOLHDL, LDLDIRECT in the last 72 hours. Thyroid function studies No results for input(s): TSH, T4TOTAL, T3FREE, THYROIDAB in the last 72 hours.  Invalid input(s): FREET3 Anemia work up No results for input(s): VITAMINB12, FOLATE, FERRITIN, TIBC, IRON, RETICCTPCT in the last 72 hours. Microbiology Recent Results (from the past 240 hour(s))  Resp Panel by RT-PCR (Flu A&B, Covid) Nasopharyngeal Swab     Status: None   Collection Time: 04/29/21  6:51 PM   Specimen: Nasopharyngeal Swab; Nasopharyngeal(NP) swabs in vial  transport medium  Result Value Ref Range Status   SARS Coronavirus 2 by RT PCR NEGATIVE NEGATIVE Final  Comment: (NOTE) SARS-CoV-2 target nucleic acids are NOT DETECTED.  The SARS-CoV-2 RNA is generally detectable in upper respiratory specimens during the acute phase of infection. The lowest concentration of SARS-CoV-2 viral copies this assay can detect is 138 copies/mL. A negative result does not preclude SARS-Cov-2 infection and should not be used as the sole basis for treatment or other patient management decisions. A negative result may occur with  improper specimen collection/handling, submission of specimen other than nasopharyngeal swab, presence of viral mutation(s) within the areas targeted by this assay, and inadequate number of viral copies(<138 copies/mL). A negative result must be combined with clinical observations, patient history, and epidemiological information. The expected result is Negative.  Fact Sheet for Patients:  EntrepreneurPulse.com.au  Fact Sheet for Healthcare Providers:  IncredibleEmployment.be  This test is no t yet approved or cleared by the [place] FDA and  has been authorized for detection and/or diagnosis of SARS-CoV-2 by FDA under an Emergency Use Authorization (EUA). This EUA will remain  in effect (meaning this test can be used) for the duration of the COVID-19 declaration under Section 564(b)(1) of the Act, 21 U.S.C.section 360bbb-3(b)(1), unless the authorization is terminated  or revoked sooner.       Influenza A by PCR NEGATIVE NEGATIVE Final   Influenza B by PCR NEGATIVE NEGATIVE Final    Comment: (NOTE) The Xpert Xpress SARS-CoV-2/FLU/RSV plus assay is intended as an aid in the diagnosis of influenza from Nasopharyngeal swab specimens and should not be used as a sole basis for treatment. Nasal washings and aspirates are unacceptable for Xpert Xpress SARS-CoV-2/FLU/RSV testing.  Fact  Sheet for Patients: EntrepreneurPulse.com.au  Fact Sheet for Healthcare Providers: IncredibleEmployment.be  This test is not yet approved or cleared by the [place] FDA and has been authorized for detection and/or diagnosis of SARS-CoV-2 by FDA under an Emergency Use Authorization (EUA). This EUA will remain in effect (meaning this test can be used) for the duration of the COVID-19 declaration under Section 564(b)(1) of the Act, 21 U.S.C. section 360bbb-3(b)(1), unless the authorization is terminated or revoked.  Performed at [facility], [place] [address] [place], [place] [address]   Urine Culture     Status: Abnormal   Collection Time: 04/30/21  3:29 AM   Specimen: Urine, Clean Catch  Result Value Ref Range Status   Specimen Description URINE, CLEAN CATCH  Final   Special Requests NONE  Final   Culture (A)  Final    80,000 COLONIES/mL LACTOBACILLUS SPECIES Standardized susceptibility testing for this organism is not available. Performed at [facility], [place] [address], [place], [place] [address]    Report Status 05/01/2021 FINAL  Final     Discharge Instructions:   Discharge Instructions     (HEART FAILURE PATIENTS) Call MD:  Anytime you have any of the following symptoms: 1) 3 pound weight gain in 24 hours or 5 pounds in 1 week 2) shortness of breath, with or without a dry hacking cough 3) swelling in the hands, feet or stomach 4) if you have to sleep on extra pillows at night in order to breathe.   Complete by: As directed    Ambulatory referral to Gastroenterology   Complete by: As directed    Consideration for EGD   Diet - low sodium heart healthy   Complete by: As directed    Diet Carb Modified   Complete by: As directed    Heart Failure patients record your daily weight using the same  scale at the same time of day   Complete by: As directed    Increase activity slowly   Complete by: As directed        Allergies as of 05/03/2021       Reactions   Hydrocodone Itching   Patient able to tolerate when taken with Benadryl.   Oxycodone-acetaminophen Itching   Patient able to tolerate when taken with Benadryl.        Medication List     STOP taking these medications    chlorthalidone 50 MG tablet Commonly known as: HYGROTON   empagliflozin 10 MG Tabs tablet Commonly known as: Jardiance   ferrous sulfate 325 (65 FE) MG tablet   hydrALAZINE 100 MG tablet Commonly known as: APRESOLINE   senna 8.6 MG Tabs tablet Commonly known as: SENOKOT   torsemide 20 MG tablet Commonly known as: DEMADEX   Torsemide 40 MG Tabs       TAKE these medications    acetaminophen 500 MG tablet Commonly known as: TYLENOL Take 1,000 mg by mouth every 6 (six) hours as needed for moderate pain or headache.   allopurinol 100 MG tablet Commonly known as: ZYLOPRIM Take 100 mg by mouth daily.   amLODipine 10 MG tablet Commonly known as: NORVASC Take 10 mg by mouth every morning.   aspirin EC 81 MG tablet Take 81 mg by mouth daily. Swallow whole.   atorvastatin 20 MG tablet Commonly known as: LIPITOR Take 20 mg by mouth every evening.   buPROPion 100 MG tablet Commonly known as: WELLBUTRIN Take 100 mg by mouth 2 (two) times daily.   carvedilol 25 MG tablet Commonly known as: COREG Take 1 tablet (25 mg total) by mouth 2 (two) times daily with a meal.   cloNIDine 0.3 MG tablet Commonly known as: CATAPRES Take 1 tablet (0.3 mg total) by mouth 3 (three) times daily. What changed: when to take this   diclofenac Sodium 1 % Gel Commonly known as: VOLTAREN Apply 2 g topically 4 (four) times daily.   doxazosin 8 MG tablet Commonly known as: CARDURA Take 1 tablet (8 mg total) by mouth every evening.   Fish Oil 1000 MG Caps Take 1 capsule by mouth daily.   fluticasone 50 MCG/ACT nasal spray Commonly known as: FLONASE Place 1 spray into both nostrils daily as needed for allergies  or rhinitis.   insulin glargine 100 unit/mL Sopn Commonly known as: LANTUS Inject 20 Units into the skin at bedtime as needed (if blood sugar is over 150).   isosorbide mononitrate 120 MG 24 hr tablet Commonly known as: IMDUR Take 1 tablet (120 mg total) by mouth daily. What changed:  additional instructions Another medication with the same name was removed. Continue taking this medication, and follow the directions you see here.   loratadine 10 MG tablet Commonly known as: CLARITIN Take 10 mg by mouth daily.   Multivitamin Adult Tabs Take 1 tablet by mouth daily.   nitroGLYCERIN 0.4 MG SL tablet Commonly known as: NITROSTAT Place 1 tablet (0.4 mg total) under the tongue every 5 (five) minutes as needed for chest pain.   NovoLOG FlexPen 100 UNIT/ML FlexPen Generic drug: insulin aspart Inject 8 Units into the skin 3 (three) times daily with meals.   oxyCODONE 5 MG immediate release tablet Commonly known as: Roxicodone Take 1 tablet (5 mg total) by mouth every 8 (eight) hours as needed for up to 5 days for severe pain.   pantoprazole 40 MG tablet Commonly known  as: PROTONIX Take 1 tablet (40 mg total) by mouth daily.   polyethylene glycol 17 g packet Commonly known as: MIRALAX / GLYCOLAX Take 17 g by mouth daily.   pregabalin 25 MG capsule Commonly known as: LYRICA Take 25 mg by mouth 2 (two) times daily.   sertraline 50 MG tablet Commonly known as: ZOLOFT Take 50 mg by mouth daily.   spironolactone 25 MG tablet Commonly known as: ALDACTONE Take 1 tablet (25 mg total) by mouth daily.   sucralfate 1 GM/10ML suspension Commonly known as: CARAFATE Take 10 mLs (1 g total) by mouth 4 (four) times daily -  with meals and at bedtime for 6 days.   traZODone 50 MG tablet Commonly known as: DESYREL Take 50 mg by mouth at bedtime.        Follow-up Information     Nolene Ebbs, MD Follow up in 1 week(s).   Specialty: Internal Medicine Contact information: [address] [place] [address] [phone]                  Time coordinating discharge: 35 min  Signed:  [name] DO  [facility] 05/03/2021, 2:14 PM

## 2021-05-05 ENCOUNTER — Encounter (HOSPITAL_COMMUNITY): Payer: Self-pay | Admitting: Cardiology

## 2021-05-05 ENCOUNTER — Other Ambulatory Visit: Payer: Self-pay

## 2021-05-05 ENCOUNTER — Telehealth (HOSPITAL_COMMUNITY): Payer: Self-pay | Admitting: Surgery

## 2021-05-05 ENCOUNTER — Telehealth: Payer: Self-pay

## 2021-05-05 ENCOUNTER — Ambulatory Visit (HOSPITAL_COMMUNITY)
Admission: RE | Admit: 2021-05-05 | Discharge: 2021-05-05 | Disposition: A | Discharge status: Home / Self Care | Payer: Medicaid Other | Source: Ambulatory Visit | Attending: Cardiology | Admitting: Cardiology

## 2021-05-05 VITALS — BP 160/80 | HR 61 | Wt 222.6 lb

## 2021-05-05 DIAGNOSIS — Z794 Long term (current) use of insulin: Secondary | ICD-10-CM

## 2021-05-05 DIAGNOSIS — I2510 Atherosclerotic heart disease of native coronary artery without angina pectoris: Secondary | ICD-10-CM | POA: Insufficient documentation

## 2021-05-05 DIAGNOSIS — I5032 Chronic diastolic (congestive) heart failure: Secondary | ICD-10-CM | POA: Diagnosis not present

## 2021-05-05 DIAGNOSIS — I43 Cardiomyopathy in diseases classified elsewhere: Secondary | ICD-10-CM | POA: Diagnosis not present

## 2021-05-05 DIAGNOSIS — Z7982 Long term (current) use of aspirin: Secondary | ICD-10-CM | POA: Insufficient documentation

## 2021-05-05 DIAGNOSIS — I951 Orthostatic hypotension: Secondary | ICD-10-CM | POA: Insufficient documentation

## 2021-05-05 DIAGNOSIS — E1122 Type 2 diabetes mellitus with diabetic chronic kidney disease: Secondary | ICD-10-CM | POA: Diagnosis not present

## 2021-05-05 DIAGNOSIS — N184 Chronic kidney disease, stage 4 (severe): Secondary | ICD-10-CM | POA: Diagnosis not present

## 2021-05-05 DIAGNOSIS — I160 Hypertensive urgency: Secondary | ICD-10-CM

## 2021-05-05 DIAGNOSIS — I130 Hypertensive heart and chronic kidney disease with heart failure and stage 1 through stage 4 chronic kidney disease, or unspecified chronic kidney disease: Secondary | ICD-10-CM | POA: Insufficient documentation

## 2021-05-05 DIAGNOSIS — Z79899 Other long term (current) drug therapy: Secondary | ICD-10-CM | POA: Diagnosis not present

## 2021-05-05 DIAGNOSIS — E854 Organ-limited amyloidosis: Secondary | ICD-10-CM | POA: Diagnosis not present

## 2021-05-05 LAB — BASIC METABOLIC PANEL
Anion gap: 9 (ref 5–15)
BUN: 72 mg/dL — ABNORMAL HIGH (ref 6–20)
CO2: 24 mmol/L (ref 22–32)
Calcium: 9.3 mg/dL (ref 8.9–10.3)
Chloride: 108 mmol/L (ref 98–111)
Creatinine, Ser: 3.12 mg/dL — ABNORMAL HIGH (ref 0.44–1.00)
GFR, Estimated: 17 mL/min — ABNORMAL LOW (ref >60)
Glucose, Bld: 162 mg/dL — ABNORMAL HIGH (ref 70–99)
Potassium: 4.2 mmol/L (ref 3.5–5.1)
Sodium: 141 mmol/L (ref 135–145)

## 2021-05-05 LAB — CBC
HCT: 33.4 % — ABNORMAL LOW (ref 36.0–46.0)
Hemoglobin: 10.5 g/dL — ABNORMAL LOW (ref 12.0–15.0)
MCH: 26.9 pg (ref 26.0–34.0)
MCHC: 31.4 g/dL (ref 30.0–36.0)
MCV: 85.4 fL (ref 80.0–100.0)
Platelets: 213 10*3/uL (ref 150–400)
RBC: 3.91 MIL/uL (ref 3.87–5.11)
RDW: 17 % — ABNORMAL HIGH (ref 11.5–15.5)
WBC: 4.3 10*3/uL (ref 4.0–10.5)
nRBC: 0 % (ref 0.0–0.2)

## 2021-05-05 LAB — BRAIN NATRIURETIC PEPTIDE: B Natriuretic Peptide: 47 pg/mL (ref 0.0–100.0)

## 2021-05-05 MED ORDER — TORSEMIDE 20 MG PO TABS: 20.0000 mg | ORAL_TABLET | Freq: Every day | ORAL | 3 refills | Status: DC

## 2021-05-05 MED ORDER — TORSEMIDE 20 MG PO TABS: 20.0000 mg | ORAL_TABLET | ORAL | 3 refills | Status: DC

## 2021-05-05 MED ORDER — ISOSORBIDE MONONITRATE ER 60 MG PO TB24: 60.0000 mg | ORAL_TABLET | Freq: Every day | ORAL | 3 refills | Status: DC

## 2021-05-05 NOTE — Telephone Encounter (Signed)
-----   Message from Larey Dresser, MD sent at 05/05/2021  3:48 PM EST ----- Creatinine is higher again.  Would have her only take torsemide 20 mg every other day rather than daily.

## 2021-05-05 NOTE — Telephone Encounter (Signed)
Transition Care Management Unsuccessful Follow-up Telephone Call  Date of discharge and from where:  05/03/2021 from [facility]  Attempts:  1st Attempt  Reason for unsuccessful TCM follow-up call:  Left voice message

## 2021-05-05 NOTE — Patient Instructions (Addendum)
RedsClip done today.  Labs done today. We will contact you only if your labs are abnormal.  TAKE TORSEMIDE 40MG  (2 TABLETS) BY MOUTH DAILY FOR 1 DAY THEN DECREASE TO 20MG  (1 TABLET) BY MOUTH DAILY.   DECREASE Imdur to 60mg  (1 tablet) by mouth daily.   No other medication changes were made. Please continue all current medications as prescribed.  Your provider has requested that you have a PYP scan done. This has to be approved through your insurance prior to scheduling, once approved we will contact you to schedule an appointment.   Please wear your compression hose daily, place them on as soon as you get up in the morning and remove before you go to bed at night. You were provided a prescription for this and an abdominal binder. You can pick these up at [facility]. [address], [place], [place] [address] [phone]  Your physician recommends that you schedule a follow-up appointment in: 10 days for a lab only appointment and in 3 weeks with our NP/PA Clinic here in our office.   If you have any questions or concerns before your next appointment please send [place] a message through [place] or call our office at [phone].    TO LEAVE A MESSAGE FOR THE NURSE SELECT OPTION 2, PLEASE LEAVE A MESSAGE INCLUDING: YOUR NAME DATE OF BIRTH CALL BACK NUMBER REASON FOR CALL**this is important as we prioritize the call backs  YOU WILL RECEIVE A CALL BACK THE SAME DAY AS LONG AS YOU CALL BEFORE 4:00 PM   Do the following things EVERYDAY: Weigh yourself in the morning before breakfast. Write it down and keep it in a log. Take your medicines as prescribed Eat low salt foods--Limit salt (sodium) to 2000 mg per day.  Stay as active as you can everyday Limit all fluids for the day to less than 2 liters   At the [facility], you and your health needs are our priority. As part of our continuing mission to provide you with exceptional heart care, we have created  designated Provider Care Teams. These Care Teams include your primary Cardiologist (physician) and Advanced Practice Providers (APPs- Physician Assistants and Nurse Practitioners) who all work together to provide you with the care you need, when you need it.   You may see any of the following providers on your designated Care Team at your next follow up: Dr Glori Bickers Dr Haynes Kerns, NP Lyda Jester, [place] Audry Riles, PharmD   Please be sure to bring in all your medications bottles to every appointment.   You are scheduled for a Cardiac Catheterization on Wednesday, November 30 with Dr. Loralie Champagne.  1. Please arrive at the [facility] (Main Entrance A) at [facility]: [address] [place], [place] [address] at 8:00 AM (This time is two hours before your procedure to ensure your preparation). Free valet parking service is available.   Special note: Every effort is made to have your procedure done on time. Please understand that emergencies sometimes delay scheduled procedures.  2. Diet: Do not eat solid foods after midnight.  The patient may have clear liquids until 5am upon the day of the procedure.   3. Medication instructions in preparation for your procedure:  Take only 10 units of insulin the night before your procedure. Do not take any insulin on the day of the procedure.  DO NOT take your Torsemide on the day of your procedure.   On the  morning of your procedure, take your Aspirin and any morning medicines NOT listed above.  You may use sips of water.  4. Plan for one night stay--bring personal belongings. 5. Bring a current list of your medications and current insurance cards. 6. You MUST have a responsible person to drive you home. 7. Someone MUST be with you the first 24 hours after you arrive home or your discharge will be delayed. 8. Please wear clothes that are easy to get on and off and wear slip-on shoes.  Thank you for allowing [place] to  care for you!   -- [facility]

## 2021-05-05 NOTE — H&P (View-Only) (Signed)
PCP: Nolene Ebbs, MD HF Cardiology: Dr. Aundra Dubin  60 y.o. with history of supine hypertension with orthostatic hypotension, CKD stage 4, CAD, and chronic diastolic CHF was referred from [facility] clinic for evaluation of CHF.    Patient had MI in 05/2013. LHC showed 20% LAD, 50 to 60% left circumflex, and 100% RPDA stenosis with left-to-right collaterals. CAD managed medically.    She reports a long h/o diastolic heart failure and frequent hospitalizations. She moved to [place] from [facility], had been followed by [facility]. Has struggled w/ volume control and readjustment of diuretics over the years. She reports previously being prescribed Wilder Glade but had to stop b/c her insurance would not cover it. Her last echo at [facility] was 7/21. LVEF was 55%, RV normal. RVSP elevated at 60 mmHg. PYP at [facility] was negative for amyloid (grade 1, H/CL 1.21). Serum IFE not suggestive of monoclonal gammopathy. Urine IFE and immunoglobin free light chains were elevated (lg FLCK 9.24, Ig FLCL 3.75, K/L ratio 2.46. Repeat testing recommended 6 months later, but do not see that this was completed).    Referred to HTN clinic 9/22 for uncontrolled HTN. Now on multidrug regimen. Thyroid function, renin, and aldosterone were normal, as were catecholamines and metanephrines. Her PTH was elevated at 134 but calcium was normal. Renal artery doppler study was limited. There was no evidence of right RAS however the left renal artery was not well visualized due to the presence of bowel gas.  Echo was repeated 9/22 showing normal LVEF 55-60%, G1DD and normal RV.    She has struggled w/ fluid management recently, c/b stage IV CKD. Baseline SCr ~2.3.  Followed by nephrology. Previously on Bumex and metolazone. She was admitted 00/93 for a/c diastolic CHF and diuresed w/ IV Lasix. Did not get RHC. Transitioned back to PO diuretics, Torsemide 40 mg once daily + spiro 25 mg daily. D/w Wt 231 lb. Referred to [facility] clinic.   She was admitted again in  11/22 with chest pain, AKI, and orthostatic hypotension.  Torsemide was stopped.  HS-TnI was negative, suspect musculoskeletal chest pain.    She still has episodes of lightheadedness with standing.  Yesterday, BP was 144/73 seated and 112/60 standing. No syncope.  She has gained 4 lbs since discharge from the hospital (off torsemide). No dyspnea walking around her house but she does get short of breath walking longer distances.  She has been sleeping in a recliner for 2 years.  She has constant dull chest pain that waxes and wants over time, it is usually mild.   ECG (11/22, personally reviewed): NSR, LVH  Labs (11/22): K 3.8, creatinine 3.6 => 2.62  REDS clip 34%  PMH: 1. HTN: Urine catecholamines and metanephrines normal level, aldosterone normal level.  Complicated by orthostatic hypotension.   2. Orthostatic hypotension 3. CKD stage 4 4. Type 2 diabetes 5. OSA: Uses CPAP 6. CAD: LHC in 2014 with totally occluded RCA with collaterals, medical management.  7. Chronic diastolic CHF: Echo (8/18) with EF 55%, normal RV, PASP 60 mmHg.  - PYP scan 11/20 at [facility] was negative with grade 1, H/CL 1.21.  - Echo (9/22): EF 55-60%, mild LVH, normal RV, IVC normal.   Social History   Socioeconomic History   Marital status: Divorced    Spouse name: Not on file   Number of children: 2   Years of education: Not on file   Highest education level: Some college, no degree  Occupational History   Occupation: retired  Tobacco  Use   Smoking status: Never    Passive exposure: Never   Smokeless tobacco: Never  Vaping Use   Vaping Use: Never used  Substance and Sexual Activity   Alcohol use: Not Currently   Drug use: Not Currently   Sexual activity: Not on file  Other Topics Concern   Not on file  Social History Narrative   Not on file   Social Determinants of Health   Financial Resource Strain: Low Risk    Difficulty of Paying Living Expenses: Not very hard  Food Insecurity: No Food  Insecurity   Worried About Running Out of Food in the Last Year: Never true   Ran Out of Food in the Last Year: Never true  Transportation Needs: No Transportation Needs   Lack of Transportation (Medical): No   Lack of Transportation (Non-Medical): No  Physical Activity: Not on file  Stress: Not on file  Social Connections: Not on file  Intimate Partner Violence: Not on file   Family History  Problem Relation Age of Onset   Hypertension Mother    Stroke Mother    Hypertension Father    Hypertension Sister    Hypertension Son    Hypertension Daughter    ROS: All systems reviewed and negative except as per HPI.   Current Outpatient Medications  Medication Sig Dispense Refill   acetaminophen (TYLENOL) 500 MG tablet Take 1,000 mg by mouth every 6 (six) hours as needed for moderate pain or headache.     allopurinol (ZYLOPRIM) 100 MG tablet Take 100 mg by mouth daily.     amLODipine (NORVASC) 10 MG tablet Take 10 mg by mouth every morning.     aspirin EC 81 MG tablet Take 81 mg by mouth daily. Swallow whole.     atorvastatin (LIPITOR) 20 MG tablet Take 20 mg by mouth every evening.     buPROPion (WELLBUTRIN) 100 MG tablet Take 100 mg by mouth 2 (two) times daily.     carvedilol (COREG) 25 MG tablet Take 1 tablet (25 mg total) by mouth 2 (two) times daily with a meal. 60 tablet 0   cloNIDine (CATAPRES) 0.3 MG tablet Take 1 tablet (0.3 mg total) by mouth 3 (three) times daily. 90 tablet 0   diclofenac Sodium (VOLTAREN) 1 % GEL Apply 2 g topically 4 (four) times daily.     doxazosin (CARDURA) 8 MG tablet Take 1 tablet (8 mg total) by mouth every evening. 30 tablet 1   fluticasone (FLONASE) 50 MCG/ACT nasal spray Place 1 spray into both nostrils daily as needed for allergies or rhinitis.     loratadine (CLARITIN) 10 MG tablet Take 10 mg by mouth daily.     Multiple Vitamin (MULTIVITAMIN ADULT) TABS Take 1 tablet by mouth daily.     nitroGLYCERIN (NITROSTAT) 0.4 MG SL tablet Place 1 tablet  (0.4 mg total) under the tongue every 5 (five) minutes as needed for chest pain. 20 tablet 0   NOVOLOG FLEXPEN 100 UNIT/ML FlexPen Inject 8 Units into the skin 3 (three) times daily with meals.     Omega-3 Fatty Acids (FISH OIL) 1000 MG CAPS Take 1 capsule by mouth daily.     oxyCODONE (ROXICODONE) 5 MG immediate release tablet Take 1 tablet (5 mg total) by mouth every 8 (eight) hours as needed for up to 5 days for severe pain. 15 tablet 0   pantoprazole (PROTONIX) 40 MG tablet Take 1 tablet (40 mg total) by mouth daily. 30 tablet 0  polyethylene glycol (MIRALAX / GLYCOLAX) 17 g packet Take 17 g by mouth daily. 14 each 0   pregabalin (LYRICA) 25 MG capsule Take 25 mg by mouth 2 (two) times daily.     sertraline (ZOLOFT) 50 MG tablet Take 50 mg by mouth daily.     spironolactone (ALDACTONE) 25 MG tablet Take 1 tablet (25 mg total) by mouth daily. 30 tablet 1   sucralfate (CARAFATE) 1 GM/10ML suspension Take 10 mLs (1 g total) by mouth 4 (four) times daily -  with meals and at bedtime for 6 days. 240 mL 0   traZODone (DESYREL) 50 MG tablet Take 50 mg by mouth at bedtime.     insulin glargine (LANTUS) 100 unit/mL SOPN Inject 20 Units into the skin at bedtime as needed (if blood sugar is over 150). (Patient not taking: Reported on 05/05/2021)     isosorbide mononitrate (IMDUR) 60 MG 24 hr tablet Take 1 tablet (60 mg total) by mouth daily. 90 tablet 3   torsemide (DEMADEX) 20 MG tablet Take 1 tablet (20 mg total) by mouth every other day. 90 tablet 3   No current facility-administered medications for this encounter.   BP (!) 160/80   Pulse 61   Wt 101 kg (222 lb 9.6 oz)   SpO2 98%   BMI 39.43 kg/m  General: NAD Neck: JVP 8 cm, no thyromegaly or thyroid nodule.  Lungs: Clear to auscultation bilaterally with normal respiratory effort. CV: Nondisplaced PMI.  Heart regular S1/S2, no S3/S4, no murmur.  Trace ankle edema.  No carotid bruit.  Normal pedal pulses.  Abdomen: Soft, nontender, no  hepatosplenomegaly, no distention.  Skin: Intact without lesions or rashes.  Neurologic: Alert and oriented x 3.  Psych: Normal affect. Extremities: No clubbing or cyanosis.  HEENT: Normal.   Assessment/Plan: 1. Supine hypertension with orthostatic hypotension: This is difficult to manage.  Resting BP remains high today but she is still orthostatic with symptoms.  Workup for secondary hypertension has been negative.  I remain concerned that autonomic neuropathy from amyloidosis could be present.  - Continue Coreg 25 mg bid, clonidine at current dose, doxazosin, and amlodipine.  - I will give her a prescription for graded compression stockings and an abdominal binder, would like her to use both during the day.  - Decrease Imdur to 30 mg daily.  2. Chronic diastolic CHF: Echo in 2/83 with EF 55-60%, normal RV.  She has struggled with volume overload in setting of CKD stage IV.  She is currently off torsemide due to recent AKI, and weight has gone up 4 lbs since discharge.  Mild volume overload on exam.  REDS clip borderline at 34%.  - Restart torsemide at lower dose, 20 mg daily.  BMET today and again in 10 days.  - Somewhat equivocal PYP scan in 11/20 (grade 1, H/CL 1.21).  Given diastolic CHF and possible autonomic neuropathy, I would like to repeat PYP scan to see if it is now clearly positive for TTR cardiac amyloidosis.  I will also check myeloma panel and urine immunofixation.  - I am going to arrange for RHC to assess filling pressures given difficulty balancing CKD and CHF.  I will see if we can get approval for Cardiomems, but suspect this will not be an option with Medicaid. We discussed risks/benefits and she agrees to [place].  3. CKD stage IV: Check BMET today.  4. CAD: H/o occluded PDA with collaterals.  No chest pain.  - Continue ASA 81 daily.  -  Continue atorvastatin, check lipids.  - Decreasing Imdur as above.   Maria Hudson 05/05/2021

## 2021-05-05 NOTE — Progress Notes (Signed)
ReDS Vest / Clip - 05/05/21 0900       ReDS Vest / Clip   Station Marker A    Ruler Value 31    ReDS Value Range Low volume    ReDS Actual Value 34

## 2021-05-05 NOTE — Telephone Encounter (Signed)
Patient contacted and results reviewed as well as direction from Dr. Aundra Dubin.  I have updated medications list.

## 2021-05-05 NOTE — Patient Outreach (Signed)
[facility]) Care Management  05/05/2021  Sande Pickert [birth year] [id number]   Referral for Managed Medicaid team, new HF, high risk.  Primary Care Provider:  Nolene Ebbs, MD May need a TOC call follow up.  Natividad Brood, RN BSN [facility] Liaison  [phone] business mobile phone Toll free office [phone]  Fax number: [phone] [email or web address]

## 2021-05-05 NOTE — Progress Notes (Signed)
PCP: Nolene Ebbs, MD HF Cardiology: Dr. Aundra Dubin  60 y.o. with history of supine hypertension with orthostatic hypotension, CKD stage 4, CAD, and chronic diastolic CHF was referred from [facility] clinic for evaluation of CHF.    Patient had MI in 05/2013. LHC showed 20% LAD, 50 to 60% left circumflex, and 100% RPDA stenosis with left-to-right collaterals. CAD managed medically.    She reports a long h/o diastolic heart failure and frequent hospitalizations. She moved to [place] from [facility], had been followed by [facility]. Has struggled w/ volume control and readjustment of diuretics over the years. She reports previously being prescribed Wilder Glade but had to stop b/c her insurance would not cover it. Her last echo at [facility] was 7/21. LVEF was 55%, RV normal. RVSP elevated at 60 mmHg. PYP at [facility] was negative for amyloid (grade 1, H/CL 1.21). Serum IFE not suggestive of monoclonal gammopathy. Urine IFE and immunoglobin free light chains were elevated (lg FLCK 9.24, Ig FLCL 3.75, K/L ratio 2.46. Repeat testing recommended 6 months later, but do not see that this was completed).    Referred to HTN clinic 9/22 for uncontrolled HTN. Now on multidrug regimen. Thyroid function, renin, and aldosterone were normal, as were catecholamines and metanephrines. Her PTH was elevated at 134 but calcium was normal. Renal artery doppler study was limited. There was no evidence of right RAS however the left renal artery was not well visualized due to the presence of bowel gas.  Echo was repeated 9/22 showing normal LVEF 55-60%, G1DD and normal RV.    She has struggled w/ fluid management recently, c/b stage IV CKD. Baseline SCr ~2.3.  Followed by nephrology. Previously on Bumex and metolazone. She was admitted 40/98 for a/c diastolic CHF and diuresed w/ IV Lasix. Did not get RHC. Transitioned back to PO diuretics, Torsemide 40 mg once daily + spiro 25 mg daily. D/w Wt 231 lb. Referred to [facility] clinic.   She was admitted again in  11/22 with chest pain, AKI, and orthostatic hypotension.  Torsemide was stopped.  HS-TnI was negative, suspect musculoskeletal chest pain.    She still has episodes of lightheadedness with standing.  Yesterday, BP was 144/73 seated and 112/60 standing. No syncope.  She has gained 4 lbs since discharge from the hospital (off torsemide). No dyspnea walking around her house but she does get short of breath walking longer distances.  She has been sleeping in a recliner for 2 years.  She has constant dull chest pain that waxes and wants over time, it is usually mild.   ECG (11/22, personally reviewed): NSR, LVH  Labs (11/22): K 3.8, creatinine 3.6 => 2.62  REDS clip 34%  PMH: 1. HTN: Urine catecholamines and metanephrines normal level, aldosterone normal level.  Complicated by orthostatic hypotension.   2. Orthostatic hypotension 3. CKD stage 4 4. Type 2 diabetes 5. OSA: Uses CPAP 6. CAD: LHC in 2014 with totally occluded RCA with collaterals, medical management.  7. Chronic diastolic CHF: Echo (1/19) with EF 55%, normal RV, PASP 60 mmHg.  - PYP scan 11/20 at [facility] was negative with grade 1, H/CL 1.21.  - Echo (9/22): EF 55-60%, mild LVH, normal RV, IVC normal.   Social History   Socioeconomic History   Marital status: Divorced    Spouse name: Not on file   Number of children: 2   Years of education: Not on file   Highest education level: Some college, no degree  Occupational History   Occupation: retired  Tobacco  Use   Smoking status: Never    Passive exposure: Never   Smokeless tobacco: Never  Vaping Use   Vaping Use: Never used  Substance and Sexual Activity   Alcohol use: Not Currently   Drug use: Not Currently   Sexual activity: Not on file  Other Topics Concern   Not on file  Social History Narrative   Not on file   Social Determinants of Health   Financial Resource Strain: Low Risk    Difficulty of Paying Living Expenses: Not very hard  Food Insecurity: No Food  Insecurity   Worried About Running Out of Food in the Last Year: Never true   Ran Out of Food in the Last Year: Never true  Transportation Needs: No Transportation Needs   Lack of Transportation (Medical): No   Lack of Transportation (Non-Medical): No  Physical Activity: Not on file  Stress: Not on file  Social Connections: Not on file  Intimate Partner Violence: Not on file   Family History  Problem Relation Age of Onset   Hypertension Mother    Stroke Mother    Hypertension Father    Hypertension Sister    Hypertension Son    Hypertension Daughter    ROS: All systems reviewed and negative except as per HPI.   Current Outpatient Medications  Medication Sig Dispense Refill   acetaminophen (TYLENOL) 500 MG tablet Take 1,000 mg by mouth every 6 (six) hours as needed for moderate pain or headache.     allopurinol (ZYLOPRIM) 100 MG tablet Take 100 mg by mouth daily.     amLODipine (NORVASC) 10 MG tablet Take 10 mg by mouth every morning.     aspirin EC 81 MG tablet Take 81 mg by mouth daily. Swallow whole.     atorvastatin (LIPITOR) 20 MG tablet Take 20 mg by mouth every evening.     buPROPion (WELLBUTRIN) 100 MG tablet Take 100 mg by mouth 2 (two) times daily.     carvedilol (COREG) 25 MG tablet Take 1 tablet (25 mg total) by mouth 2 (two) times daily with a meal. 60 tablet 0   cloNIDine (CATAPRES) 0.3 MG tablet Take 1 tablet (0.3 mg total) by mouth 3 (three) times daily. 90 tablet 0   diclofenac Sodium (VOLTAREN) 1 % GEL Apply 2 g topically 4 (four) times daily.     doxazosin (CARDURA) 8 MG tablet Take 1 tablet (8 mg total) by mouth every evening. 30 tablet 1   fluticasone (FLONASE) 50 MCG/ACT nasal spray Place 1 spray into both nostrils daily as needed for allergies or rhinitis.     loratadine (CLARITIN) 10 MG tablet Take 10 mg by mouth daily.     Multiple Vitamin (MULTIVITAMIN ADULT) TABS Take 1 tablet by mouth daily.     nitroGLYCERIN (NITROSTAT) 0.4 MG SL tablet Place 1 tablet  (0.4 mg total) under the tongue every 5 (five) minutes as needed for chest pain. 20 tablet 0   NOVOLOG FLEXPEN 100 UNIT/ML FlexPen Inject 8 Units into the skin 3 (three) times daily with meals.     Omega-3 Fatty Acids (FISH OIL) 1000 MG CAPS Take 1 capsule by mouth daily.     oxyCODONE (ROXICODONE) 5 MG immediate release tablet Take 1 tablet (5 mg total) by mouth every 8 (eight) hours as needed for up to 5 days for severe pain. 15 tablet 0   pantoprazole (PROTONIX) 40 MG tablet Take 1 tablet (40 mg total) by mouth daily. 30 tablet 0  polyethylene glycol (MIRALAX / GLYCOLAX) 17 g packet Take 17 g by mouth daily. 14 each 0   pregabalin (LYRICA) 25 MG capsule Take 25 mg by mouth 2 (two) times daily.     sertraline (ZOLOFT) 50 MG tablet Take 50 mg by mouth daily.     spironolactone (ALDACTONE) 25 MG tablet Take 1 tablet (25 mg total) by mouth daily. 30 tablet 1   sucralfate (CARAFATE) 1 GM/10ML suspension Take 10 mLs (1 g total) by mouth 4 (four) times daily -  with meals and at bedtime for 6 days. 240 mL 0   traZODone (DESYREL) 50 MG tablet Take 50 mg by mouth at bedtime.     insulin glargine (LANTUS) 100 unit/mL SOPN Inject 20 Units into the skin at bedtime as needed (if blood sugar is over 150). (Patient not taking: Reported on 05/05/2021)     isosorbide mononitrate (IMDUR) 60 MG 24 hr tablet Take 1 tablet (60 mg total) by mouth daily. 90 tablet 3   torsemide (DEMADEX) 20 MG tablet Take 1 tablet (20 mg total) by mouth every other day. 90 tablet 3   No current facility-administered medications for this encounter.   BP (!) 160/80   Pulse 61   Wt 101 kg (222 lb 9.6 oz)   SpO2 98%   BMI 39.43 kg/m  General: NAD Neck: JVP 8 cm, no thyromegaly or thyroid nodule.  Lungs: Clear to auscultation bilaterally with normal respiratory effort. CV: Nondisplaced PMI.  Heart regular S1/S2, no S3/S4, no murmur.  Trace ankle edema.  No carotid bruit.  Normal pedal pulses.  Abdomen: Soft, nontender, no  hepatosplenomegaly, no distention.  Skin: Intact without lesions or rashes.  Neurologic: Alert and oriented x 3.  Psych: Normal affect. Extremities: No clubbing or cyanosis.  HEENT: Normal.   Assessment/Plan: 1. Supine hypertension with orthostatic hypotension: This is difficult to manage.  Resting BP remains high today but she is still orthostatic with symptoms.  Workup for secondary hypertension has been negative.  I remain concerned that autonomic neuropathy from amyloidosis could be present.  - Continue Coreg 25 mg bid, clonidine at current dose, doxazosin, and amlodipine.  - I will give her a prescription for graded compression stockings and an abdominal binder, would like her to use both during the day.  - Decrease Imdur to 30 mg daily.  2. Chronic diastolic CHF: Echo in 4/19 with EF 55-60%, normal RV.  She has struggled with volume overload in setting of CKD stage IV.  She is currently off torsemide due to recent AKI, and weight has gone up 4 lbs since discharge.  Mild volume overload on exam.  REDS clip borderline at 34%.  - Restart torsemide at lower dose, 20 mg daily.  BMET today and again in 10 days.  - Somewhat equivocal PYP scan in 11/20 (grade 1, H/CL 1.21).  Given diastolic CHF and possible autonomic neuropathy, I would like to repeat PYP scan to see if it is now clearly positive for TTR cardiac amyloidosis.  I will also check myeloma panel and urine immunofixation.  - I am going to arrange for RHC to assess filling pressures given difficulty balancing CKD and CHF.  I will see if we can get approval for Cardiomems, but suspect this will not be an option with Medicaid. We discussed risks/benefits and she agrees to [place].  3. CKD stage IV: Check BMET today.  4. CAD: H/o occluded PDA with collaterals.  No chest pain.  - Continue ASA 81 daily.  -  Continue atorvastatin, check lipids.  - Decreasing Imdur as above.   Loralie Champagne 05/05/2021

## 2021-05-06 ENCOUNTER — Other Ambulatory Visit: Payer: Self-pay | Admitting: *Deleted

## 2021-05-06 LAB — KAPPA/LAMBDA LIGHT CHAINS
Kappa free light chain: 95.1 mg/L — ABNORMAL HIGH (ref 3.3–19.4)
Kappa, lambda light chain ratio: 2.5 — ABNORMAL HIGH (ref 0.26–1.65)
Lambda free light chains: 38.1 mg/L — ABNORMAL HIGH (ref 5.7–26.3)

## 2021-05-06 LAB — IMMUNOFIXATION, URINE

## 2021-05-06 NOTE — Patient Outreach (Signed)
Medicaid Managed Care   Nurse Care Manager Note  05/06/2021 Name:  Dulcinea Kinser MRN:  [id number] DOB:  Jul 18, 1960  Deone Omahoney is an 60 y.o. year old female who is a primary patient of Nolene Ebbs, MD.  The [facility] Managed Care Coordination team was consulted for assistance with:    CHF CAD HTN HLD DMI CKD Stage4 Depression  Ms. Stanko was given information about Medicaid Managed Care Coordination team services today. [name] Patient agreed to services and verbal consent obtained.  [name] for initial visit in response to provider referral for case management and/or care coordination services.   Assessments/Interventions:  Review of past medical history, allergies, medications, health status, including review of consultants reports, laboratory and other test data, was performed as part of comprehensive evaluation and provision of chronic care management services.  SDOH (Social Determinants of Health) assessments and interventions performed: SDOH Interventions    Flowsheet Row Most Recent Value  SDOH Interventions   Housing Interventions --  [referral to managed [employer] BSW For assistance with low income housing. Says she wants to find a place of her own,  she feels she will die soon and she doesn't want to die in her daughter's home because it would be traumatic to her family members]  Intimate Partner Violence Interventions Intervention Not Indicated  Physical Activity Interventions Other (Comments)  [discussed health plan benefits of [employer], [employer], health and wellness coaches]  Social Connections Interventions Intervention Not Indicated  Depression Interventions/Treatment  --  [referral to [facility] Medicaid LCSW]       Care Plan  Allergies  Allergen Reactions   Hydrocodone Itching    Patient able to tolerate when taken with Benadryl.   Oxycodone-Acetaminophen Itching    Patient able to tolerate when taken  with Benadryl.    Medications Reviewed Today     Reviewed by [name], CPhT (Pharmacy Technician) on 05/06/21 at [facility] Status: F/U phone call - Wed   Medication Order Taking? Sig Documenting Provider Last Dose Status Informant  acetaminophen (TYLENOL) 500 MG tablet [id number]  Take 1,000 mg by mouth every 6 (six) hours as needed for moderate pain or headache. [provider]  Active Self  allopurinol (ZYLOPRIM) 100 MG tablet [id number]  Take 100 mg by mouth daily. [provider]  Active Self  amLODipine (NORVASC) 10 MG tablet [id number]  Take 10 mg by mouth every morning. [provider]  Active Self  aspirin EC 81 MG tablet [id number]  Take 81 mg by mouth daily. Swallow whole. [provider]  Active Self           Med Note [name]   Mon Apr 14, 2021  9:22 PM)    atorvastatin (LIPITOR) 20 MG tablet [id number]  Take 20 mg by mouth every evening. [provider]  Active Self  buPROPion (WELLBUTRIN) 100 MG tablet [id number]  Take 100 mg by mouth 2 (two) times daily. [provider]  Active Self  carvedilol (COREG) 25 MG tablet [id number]  Take 1 tablet (25 mg total) by mouth 2 (two) times daily with a meal. [name], [name], MD  Active Self  cloNIDine (CATAPRES) 0.3 MG tablet [id number]  Take 1 tablet (0.3 mg total) by mouth 3 (three) times daily. [name], DO  Active   diclofenac Sodium (VOLTAREN) 1 % GEL [id number]  Apply 2 g topically 4 (four) times daily. [name], DO  Active  Med Note [name], [name] May 06, 2021 12:59 PM) Use to treat left sided chest pain- non cardiac pain  doxazosin (CARDURA) 8 MG tablet [id number]  Take 1 tablet (8 mg total) by mouth every evening. [name], DO  Active Self  fluticasone (FLONASE) 50 MCG/ACT nasal spray [id number]  Place 1 spray into both nostrils daily as needed for allergies or rhinitis. [provider]  Active Self  insulin  glargine (LANTUS) 100 unit/mL SOPN [id number]  Inject 20 Units into the skin at bedtime as needed (if blood sugar is over 150). [provider]  Active Self           Med Note [name], MELISSA R   Tue May 06, 2021  3:37 PM)    isosorbide mononitrate (IMDUR) 60 MG 24 hr tablet [id number]  Take 1 tablet (60 mg total) by mouth daily. Larey Dresser, MD  Active   loratadine (CLARITIN) 10 MG tablet [id number]  Take 10 mg by mouth daily. [provider]  Active Self  Multiple Vitamin (MULTIVITAMIN ADULT) TABS [id number]  Take 1 tablet by mouth daily. [provider]  Active Self  nitroGLYCERIN (NITROSTAT) 0.4 MG SL tablet [id number]  Place 1 tablet (0.4 mg total) under the tongue every 5 (five) minutes as needed for chest pain. [name], [name], MD  Active Self           Med Note Duffy Bruce, [name]   Tue Apr 29, 2021  9:30 PM)    NOVOLOG FLEXPEN 100 UNIT/ML FlexPen [id number]  Inject 8 Units into the skin 3 (three) times daily with meals. [provider]  Active Self           Med Note [name], Trude Mcburney   Tue May 06, 2021  1:12 PM) Takes 8 units if 150 or greater   Omega-3 Fatty Acids (FISH OIL) 1000 MG CAPS [id number]  Take 1 capsule by mouth daily. [provider]  Active Self  oxyCODONE (ROXICODONE) 5 MG immediate release tablet [id number]  Take 1 tablet (5 mg total) by mouth every 8 (eight) hours as needed for up to 5 days for severe pain. [name], DO  Active   pantoprazole (PROTONIX) 40 MG tablet [id number]  Take 1 tablet (40 mg total) by mouth daily. [name], DO  Active Self  polyethylene glycol (MIRALAX / GLYCOLAX) 17 g packet [id number]  Take 17 g by mouth daily. [name], [name], MD  Active Self  pregabalin (LYRICA) 25 MG capsule [id number]  Take 25 mg by mouth 2 (two) times daily. [provider]  Active Self  sertraline (ZOLOFT) 50 MG tablet [id number]  Take 50 mg by mouth daily. [provider]  Active Self   spironolactone (ALDACTONE) 25 MG tablet [id number]  Take 1 tablet (25 mg total) by mouth daily. [name], DO  Active Self  sucralfate (CARAFATE) 1 GM/10ML suspension [id number]  Take 10 mLs (1 g total) by mouth 4 (four) times daily -  with meals and at bedtime for 6 days. [name], DO  Active   torsemide (DEMADEX) 20 MG tablet [id number]  Take 1 tablet (20 mg total) by mouth every other day. Larey Dresser, MD  Active   traZODone (DESYREL) 50 MG tablet [id number]  Take 50 mg by mouth at bedtime. [provider]  Active Self            Patient Active Problem List   Diagnosis Date  Noted   Chronic diastolic CHF (congestive heart failure) ([place]) 04/29/2021   Hypertensive urgency 04/14/2021   Acute on chronic diastolic CHF (congestive heart failure) ([place]) 04/14/2021   Chest pain 03/05/2021   CAD (coronary artery disease) 03/05/2021   T2DM (type 2 diabetes mellitus) ([place]) 03/05/2021   CKD (chronic kidney disease) stage 4, GFR 15-29 ml/min (HCC) 03/05/2021   Class 3 obesity ([place]) 03/05/2021   CVA (cerebral vascular accident) ([place]) 03/05/2021   Acute on chronic diastolic heart failure ([place]) 03/05/2021   Essential hypertension 03/05/2021   AKI (acute kidney injury) ([place]) 03/05/2021     Care Plan : RN Care Manager Plan Of Care  Updates made by [name], RN since 05/06/2021 12:00 AM     Problem: Knowledge Deficit and Care Coordination Needs Related to Management of HTN, HF, Type 2 DM   Priority: High     Long-Range Goal: Development of Plan of Care to Address Knowledge Deficits and Care Coordination Needs related to  management of CHF, CAD, HTN, HLD, DMII, CKD Stage 4, and Depression   Start Date: 05/06/2021  Expected End Date: 09/08/2021  Priority: High  Note:   Current Barriers:  Knowledge Deficits related to plan of care for management of CHF, CAD, HTN, HLD, DMII, CKD Stage 4, and Depression with symptoms of depressed mood,loss of  energy/fatigue,insomnia,disturbed sleep, decreased appetite, difficulty concentrating,recurrent thoughts of death  Care Coordination needs related to Housing barriers and Mental Health Concerns    RNCM Clinical Goal(s):  Patient will demonstrate ongoing adherence to prescribed treatment plan for CHF, CAD, HTN, HLD, DMII, CKD Stage 4, and Depression as evidenced by patient report of improved health and quality of life and no unplanned ED or unplanned hospital admissions continue to work with RN Care Manager and/or Social Worker to address care management and care coordination needs related to CHF, CAD, HTN, HLD, DMII, CKD Stage 4, and Depression as evidenced by adherence to CM Team Scheduled appointments     work with pharmacist to address medication review and discuss ? medication side effect of chronic constipation and medication blister packs related to CHF, CAD, HTN, HLD, DMII, CKD Stage 4, and Depression as evidenced by review of EMR and patient or pharmacist report    through collaboration with RN Care manager, provider, and care team.  work with managed Medicaid LCSW to address mental health concerns related to the management of depression work with managed Medicaid BSW to address needs related to housing barriers as evidenced by patient receiving resources from BSW for low income housing options in [place]   Interventions: Mail health education to patient with After Visit Summary Inter-disciplinary care team collaboration (see longitudinal plan of care) Evaluation of current treatment plan related to  self management and patient's adherence to plan as established by provider   Heart Failure Interventions:  (Status: New goal.)  Long Term Goal  Basic overview and discussion of pathophysiology of Heart Failure reviewed  Diabetes:  (Status: New goal.) Long Term Goal   Lab Results  Component Value Date   HGBA1C 6.7 (H) 04/29/2021  Assessed patient's understanding of A1C goal:  <7% Provided education to patient about basic DM disease process; Reviewed medications with patient and discussed importance of medication adherence;        Counseled on importance of regular laboratory monitoring as prescribed;        Discussed plans with patient for ongoing care management follow up and provided patient with direct contact information for care management team;  Provided patient with written educational materials related to hypo and hyperglycemia and importance of correct treatment;       Advised patient, providing education and rationale, to check cbg three times daily and record        call provider for findings outside established parameters;       Review of patient status, including review of consultants reports, relevant laboratory and other test results, and medications completed;        Hypertension: (Status: New goal.) Last practice recorded BP readings:  BP Readings from Last 3 Encounters:  05/05/21 (!) 160/80  05/03/21 (!) 177/68  04/24/21 (!) 160/70  Most recent eGFR/CrCl:  Lab Results  Component Value Date   EGFR 19 (L) 03/13/2021    No components found for: CRCL  Evaluation of current treatment plan related to hypertension self management and patient's adherence to plan as established by provider;   Reviewed medications with patient and discussed importance of compliance;  Discussed plans with patient for ongoing care management follow up and provided patient with direct contact information for care management team; Advised patient, providing education and rationale, to monitor blood pressure daily and record, calling PCP for findings outside established parameters;  Reviewed scheduled/upcoming provider appointments including:   Interdisciplinary Collaboration:  (Status: New goal.) Long Term Goal  Collaborated with BSW to initiate plan of care to address needs related to Housing barriers in patient with CHF, CAD, HTN, HLD, DM, CKD Stage 4, and  Depression Collaboration with LCSW to address mental health needs related to treatment of depression Collaboration with pharmacist for medication review, discussion of ? Medication side effects causing chronic constipation and arranging for patient to received home delivery of medications in blister packs Collaboration with Nolene Ebbs, MD regarding development and update of comprehensive plan of care as evidenced by provider attestation and co-signature Inter-disciplinary care team collaboration (see longitudinal plan of care)  Patient Goals/Self-Care Activities: Take medications as prescribed   Attend all scheduled provider appointments Call pharmacy for medication refills 3-7 days in advance of running out of medications Attend church or other social activities Perform all self care activities independently  Call provider office for new concerns or questions  Work with the managed Medicaid LCSW, BSW and pharmacist  to address care coordination needs and will continue to work with the clinical team to address health care and disease management related needs call office if I gain more than 2 pounds in one day or 5 pounds in one week keep legs up while sitting track weight in diary use salt in moderation watch for swelling in feet, ankles and legs every day weigh myself daily know when to call the doctorfor changes in weight or blood pressure track symptoms and what helps feel better or worse check blood sugar at prescribed times: three times daily enter blood sugar readings and medication or insulin into daily log take the blood sugar log to all doctor visits take the blood sugar meter to all doctor visits eat fish at least once per week fill half of plate with vegetables manage portion size check blood pressure daily write blood pressure results in a log or diary take blood pressure log to all doctor appointments call doctor for signs and symptoms of high blood pressure keep all  doctor appointments take medications for blood pressure exactly as prescribed begin an exercise program report new symptoms to your doctor limit salt intake to 2000 mg/day Limit fluid intake to 2 liters per day  Follow Up:  Patient agrees to Care Plan and Follow-up.  Plan: The Managed Medicaid care management team will reach out to the patient again over the next 30 days.  Date/time of next scheduled RN care management/care coordination outreach:  06/03/21 1:30 pm  [name], [name], [facility] Care Management Coordinator - Managed [place] High Risk [phone]

## 2021-05-06 NOTE — Telephone Encounter (Signed)
Transition Care Management Follow-up Telephone Call Date of discharge and from where: 05/03/2021 from [facility] How have you been since you were released from the hospital? Pt stated that she is feeling better and did not have any questions or concerns at this time.  Any questions or concerns? No  Items Reviewed: Did the pt receive and understand the discharge instructions provided? Yes  Medications obtained and verified? Yes  Other? No  Any new allergies since your discharge? No  Dietary orders reviewed? No Do you have support at home? Yes   Functional Questionnaire: (I = Independent and D = Dependent) ADLs: I  Bathing/Dressing- I  Meal Prep- I  Eating- I  Maintaining continence- I  Transferring/Ambulation- I  Managing Meds- I   Follow up appointments reviewed:  PCP Hospital f/u appt confirmed? No  Waiting for call back from PCP office to schedule follow up.  [facility] f/u appt confirmed? Yes  Skeet Latch, MD on 05/26/2021 at 10:00 am Are transportation arrangements needed? No  If their condition worsens, is the pt aware to call PCP or go to the Emergency Dept.? Yes Was the patient provided with contact information for the PCP's office or ED? Yes Was to pt encouraged to call back with questions or concerns? Yes

## 2021-05-06 NOTE — Patient Instructions (Signed)
Visit Information  Maria Hudson was given information about Medicaid Managed Care team care coordination services as a part of their [facility] Medicaid benefit. Maria Hudson verbally consented to engagement with the [facility] Managed Care team.   If you are experiencing a medical emergency, please call 911 or report to your local emergency department or urgent care.   If you have a non-emergency medical problem during routine business hours, please contact your provider's office and ask to speak with a nurse.   For questions related to your [facility] health plan, please call: [phone] or go here:[email or web address]  If you would like to schedule transportation through your [facility] plan, please call the following number at least 2 days in advance of your appointment: [phone].  Call the [place] at [phone], at any time, 24 hours a day, 7 days a week. If you are in danger or need immediate medical attention call 911.  If you would like help to quit smoking, call 1-800-QUIT-NOW 209 043 4997) OR Espaol: 1-855-Djelo-Ya (4-801-655-3748) o para ms informacin haga clic aqu or Text READY to 200-400 to register via text    Please see education materials related to HTN, HF and Diabetes provided as print materials.   The patient verbalized understanding of instructions provided today and agreed to receive a mailed copy of patient instruction and/or educational materials.  The Managed Medicaid care management team will reach out to the patient again over the next 30 days.   [name], [name], [place] Management Coordinator - Managed [place] High Risk [phone]   Following is a copy of your plan of care:  Care Plan : RN Care Manager Plan Of Care  Updates made by [name], RN since 05/06/2021 12:00 AM     Problem: Knowledge Deficit and Care Coordination Needs Related to  Management of HTN, HF, Type 2 DM   Priority: High     Long-Range Goal: Development of Plan of Care to Address Knowledge Deficits and Care Coordination Needs related to  management of CHF, CAD, HTN, HLD, DMII, CKD Stage 4, and Depression   Start Date: 05/06/2021  Expected End Date: 09/08/2021  Priority: High  Note:   Current Barriers:  Knowledge Deficits related to plan of care for management of CHF, CAD, HTN, HLD, DMII, CKD Stage 4, and Depression: depressed mood loss of energy/fatigue insomnia disturbed sleep decreased appetite difficulty concentrating recurrent thoughts of death  Care Coordination needs related to Housing barriers and Mental Health Concerns    RNCM Clinical Goal(s):  Patient will demonstrate ongoing adherence to prescribed treatment plan for CHF, CAD, HTN, HLD, DMII, CKD Stage 4, and Depression as evidenced by patient report of improved health and quality of life and no unplanned ED or unplanned hospital admissions continue to work with RN Care Manager and/or Social Worker to address care management and care coordination needs related to CHF, CAD, HTN, HLD, DMII, CKD Stage 4, and Depression as evidenced by adherence to CM Team Scheduled appointments     work with pharmacist to address medication review and discuss ? medication side effect of chronic constipation and medication blister packs related to CHF, CAD, HTN, HLD, DMII, CKD Stage 4, and Depression as evidenced by review of EMR and patient or pharmacist report    through collaboration with RN Care manager, provider, and care team.  work with managed Medicaid LCSW to address mental health concerns related to the management of depression work with managed Medicaid  BSW to address needs related to housing barriers as evidenced by patient receiving resources from BSW for low income housing options in [place]   Interventions: Mail health education to patient with After Visit Summary Inter-disciplinary care team  collaboration (see longitudinal plan of care) Evaluation of current treatment plan related to  self management and patient's adherence to plan as established by provider   Heart Failure Interventions:  (Status: New goal.)  Long Term Goal  Basic overview and discussion of pathophysiology of Heart Failure reviewed  Diabetes:  (Status: New goal.) Long Term Goal   Lab Results  Component Value Date   HGBA1C 6.7 (H) 04/29/2021  Assessed patient's understanding of A1C goal: <7% Provided education to patient about basic DM disease process; Reviewed medications with patient and discussed importance of medication adherence;        Counseled on importance of regular laboratory monitoring as prescribed;        Discussed plans with patient for ongoing care management follow up and provided patient with direct contact information for care management team;      Provided patient with written educational materials related to hypo and hyperglycemia and importance of correct treatment;       Advised patient, providing education and rationale, to check cbg three times daily and record        call provider for findings outside established parameters;       Review of patient status, including review of consultants reports, relevant laboratory and other test results, and medications completed;        Hypertension: (Status: New goal.) Last practice recorded BP readings:  BP Readings from Last 3 Encounters:  05/05/21 (!) 160/80  05/03/21 (!) 177/68  04/24/21 (!) 160/70  Most recent eGFR/CrCl:  Lab Results  Component Value Date   EGFR 19 (L) 03/13/2021    No components found for: CRCL  Evaluation of current treatment plan related to hypertension self management and patient's adherence to plan as established by provider;   Reviewed medications with patient and discussed importance of compliance;  Discussed plans with patient for ongoing care management follow up and provided patient with direct contact  information for care management team; Advised patient, providing education and rationale, to monitor blood pressure daily and record, calling PCP for findings outside established parameters;  Reviewed scheduled/upcoming provider appointments including:   Interdisciplinary Collaboration:  (Status: New goal.) Long Term Goal  Collaborated with BSW to initiate plan of care to address needs related to Housing barriers in patient with CHF, CAD, HTN, HLD, DM, CKD Stage 4, and Depression Collaboration with LCSW to address mental health needs related to treatment of depression Collaboration with pharmacist for medication review, discussion of ? Medication side effects causing chronic constipation and arranging for patient to received home delivery of medications in blister packs Collaboration with Nolene Ebbs, MD regarding development and update of comprehensive plan of care as evidenced by provider attestation and co-signature Inter-disciplinary care team collaboration (see longitudinal plan of care)  Patient Goals/Self-Care Activities: Take medications as prescribed   Attend all scheduled provider appointments Call pharmacy for medication refills 3-7 days in advance of running out of medications Attend church or other social activities Perform all self care activities independently  Call provider office for new concerns or questions  Work with the social worker to address care coordination needs and will continue to work with the clinical team to address health care and disease management related needs call office if I gain more than 2 pounds  in one day or 5 pounds in one week keep legs up while sitting track weight in diary use salt in moderation watch for swelling in feet, ankles and legs every day weigh myself daily know when to call the doctorfor fluid weight gain or consistently abnormal BP readings at home track symptoms and what helps feel better or worse check blood sugar at prescribed  times: three times daily enter blood sugar readings and medication or insulin into daily log take the blood sugar log to all doctor visits take the blood sugar meter to all doctor visits eat fish at least once per week fill half of plate with vegetables manage portion size check blood pressure daily write blood pressure results in a log or diary learn about high blood pressure take blood pressure log to all doctor appointments call doctor for signs and symptoms of high blood pressure keep all doctor appointments take medications for blood pressure exactly as prescribed Limit fluid intake to 2 liters per day Review health education received in mail Call health plan customer service to discuss benefits related to wellness, [employer], behavioral health, and over the counter health care items Collaborate with managed Medicaid team of LCSW, BSW and pharmacist to address identified needs

## 2021-05-07 DIAGNOSIS — I2510 Atherosclerotic heart disease of native coronary artery without angina pectoris: Secondary | ICD-10-CM | POA: Diagnosis not present

## 2021-05-07 DIAGNOSIS — D631 Anemia in chronic kidney disease: Secondary | ICD-10-CM | POA: Diagnosis not present

## 2021-05-07 DIAGNOSIS — E871 Hypo-osmolality and hyponatremia: Secondary | ICD-10-CM | POA: Diagnosis not present

## 2021-05-07 DIAGNOSIS — F32A Depression, unspecified: Secondary | ICD-10-CM | POA: Diagnosis not present

## 2021-05-07 DIAGNOSIS — I5030 Unspecified diastolic (congestive) heart failure: Secondary | ICD-10-CM | POA: Diagnosis not present

## 2021-05-07 DIAGNOSIS — I129 Hypertensive chronic kidney disease with stage 1 through stage 4 chronic kidney disease, or unspecified chronic kidney disease: Secondary | ICD-10-CM | POA: Diagnosis not present

## 2021-05-07 DIAGNOSIS — N1832 Chronic kidney disease, stage 3b: Secondary | ICD-10-CM | POA: Diagnosis not present

## 2021-05-07 DIAGNOSIS — I639 Cerebral infarction, unspecified: Secondary | ICD-10-CM | POA: Diagnosis not present

## 2021-05-07 DIAGNOSIS — N189 Chronic kidney disease, unspecified: Secondary | ICD-10-CM | POA: Diagnosis not present

## 2021-05-08 LAB — MULTIPLE MYELOMA PANEL, SERUM
Albumin SerPl Elph-Mcnc: 3 g/dL (ref 2.9–4.4)
Albumin/Glob SerPl: 1.1 (ref 0.7–1.7)
Alpha 1: 0.2 g/dL (ref 0.0–0.4)
Alpha2 Glob SerPl Elph-Mcnc: 0.9 g/dL (ref 0.4–1.0)
B-Globulin SerPl Elph-Mcnc: 1 g/dL (ref 0.7–1.3)
Gamma Glob SerPl Elph-Mcnc: 0.7 g/dL (ref 0.4–1.8)
Globulin, Total: 2.9 g/dL (ref 2.2–3.9)
IgA: 214 mg/dL (ref 87–352)
IgG (Immunoglobin G), Serum: 830 mg/dL (ref 586–1602)
IgM (Immunoglobulin M), Srm: 39 mg/dL (ref 26–217)
Total Protein ELP: 5.9 g/dL — ABNORMAL LOW (ref 6.0–8.5)

## 2021-05-12 ENCOUNTER — Other Ambulatory Visit: Payer: Medicaid Other

## 2021-05-13 ENCOUNTER — Ambulatory Visit
Admission: RE | Admit: 2021-05-13 | Discharge: 2021-05-13 | Disposition: A | Discharge status: Home / Self Care | Payer: Medicaid Other | Source: Ambulatory Visit | Attending: Nephrology | Admitting: Nephrology

## 2021-05-13 DIAGNOSIS — R55 Syncope and collapse: Secondary | ICD-10-CM | POA: Diagnosis not present

## 2021-05-13 DIAGNOSIS — R079 Chest pain, unspecified: Secondary | ICD-10-CM | POA: Diagnosis not present

## 2021-05-13 DIAGNOSIS — I10 Essential (primary) hypertension: Secondary | ICD-10-CM | POA: Diagnosis not present

## 2021-05-13 DIAGNOSIS — N1832 Chronic kidney disease, stage 3b: Secondary | ICD-10-CM

## 2021-05-13 DIAGNOSIS — Q245 Malformation of coronary vessels: Secondary | ICD-10-CM | POA: Diagnosis not present

## 2021-05-13 DIAGNOSIS — N179 Acute kidney failure, unspecified: Secondary | ICD-10-CM | POA: Diagnosis not present

## 2021-05-13 DIAGNOSIS — E119 Type 2 diabetes mellitus without complications: Secondary | ICD-10-CM | POA: Diagnosis not present

## 2021-05-14 ENCOUNTER — Encounter (HOSPITAL_COMMUNITY): Payer: Self-pay | Admitting: Cardiology

## 2021-05-14 ENCOUNTER — Ambulatory Visit (HOSPITAL_COMMUNITY)
Admission: RE | Admit: 2021-05-14 | Discharge: 2021-05-14 | Disposition: A | Discharge status: Home / Self Care | Payer: Medicaid Other | Source: Ambulatory Visit | Attending: Cardiology | Admitting: Cardiology

## 2021-05-14 ENCOUNTER — Encounter (HOSPITAL_COMMUNITY)
Admission: RE | Disposition: A | Discharge status: Home / Self Care | Payer: Medicaid Other | Source: Ambulatory Visit | Attending: Cardiology

## 2021-05-14 ENCOUNTER — Telehealth (HOSPITAL_COMMUNITY): Payer: Self-pay | Admitting: *Deleted

## 2021-05-14 DIAGNOSIS — Z7982 Long term (current) use of aspirin: Secondary | ICD-10-CM | POA: Insufficient documentation

## 2021-05-14 DIAGNOSIS — I272 Pulmonary hypertension, unspecified: Secondary | ICD-10-CM | POA: Diagnosis not present

## 2021-05-14 DIAGNOSIS — I252 Old myocardial infarction: Secondary | ICD-10-CM | POA: Insufficient documentation

## 2021-05-14 DIAGNOSIS — E1122 Type 2 diabetes mellitus with diabetic chronic kidney disease: Secondary | ICD-10-CM | POA: Diagnosis not present

## 2021-05-14 DIAGNOSIS — I2510 Atherosclerotic heart disease of native coronary artery without angina pectoris: Secondary | ICD-10-CM | POA: Insufficient documentation

## 2021-05-14 DIAGNOSIS — I130 Hypertensive heart and chronic kidney disease with heart failure and stage 1 through stage 4 chronic kidney disease, or unspecified chronic kidney disease: Secondary | ICD-10-CM | POA: Diagnosis not present

## 2021-05-14 DIAGNOSIS — Z79899 Other long term (current) drug therapy: Secondary | ICD-10-CM | POA: Diagnosis not present

## 2021-05-14 DIAGNOSIS — G4733 Obstructive sleep apnea (adult) (pediatric): Secondary | ICD-10-CM | POA: Diagnosis not present

## 2021-05-14 DIAGNOSIS — I5032 Chronic diastolic (congestive) heart failure: Secondary | ICD-10-CM | POA: Diagnosis not present

## 2021-05-14 DIAGNOSIS — I951 Orthostatic hypotension: Secondary | ICD-10-CM | POA: Diagnosis not present

## 2021-05-14 DIAGNOSIS — N184 Chronic kidney disease, stage 4 (severe): Secondary | ICD-10-CM | POA: Diagnosis not present

## 2021-05-14 DIAGNOSIS — I509 Heart failure, unspecified: Secondary | ICD-10-CM | POA: Diagnosis not present

## 2021-05-14 HISTORY — PX: RIGHT HEART CATH: CATH118263

## 2021-05-14 LAB — GLUCOSE, CAPILLARY
Glucose-Capillary: 157 mg/dL — ABNORMAL HIGH (ref 70–99)
Glucose-Capillary: 141 mg/dL — ABNORMAL HIGH (ref 70–99)

## 2021-05-14 LAB — POCT I-STAT EG7
Acid-base deficit: 2 mmol/L (ref 0.0–2.0)
Acid-base deficit: 3 mmol/L — ABNORMAL HIGH (ref 0.0–2.0)
Bicarbonate: 21.6 mmol/L (ref 20.0–28.0)
Bicarbonate: 23 mmol/L (ref 20.0–28.0)
Calcium, Ion: 1.16 mmol/L (ref 1.15–1.40)
Calcium, Ion: 1.23 mmol/L (ref 1.15–1.40)
HCT: 31 % — ABNORMAL LOW (ref 36.0–46.0)
HCT: 33 % — ABNORMAL LOW (ref 36.0–46.0)
Hemoglobin: 10.5 g/dL — ABNORMAL LOW (ref 12.0–15.0)
Hemoglobin: 11.2 g/dL — ABNORMAL LOW (ref 12.0–15.0)
O2 Saturation: 65 %
O2 Saturation: 69 %
Potassium: 4.2 mmol/L (ref 3.5–5.1)
Potassium: 4.4 mmol/L (ref 3.5–5.1)
Sodium: 140 mmol/L (ref 135–145)
Sodium: 140 mmol/L (ref 135–145)
TCO2: 23 mmol/L (ref 22–32)
TCO2: 24 mmol/L (ref 22–32)
pCO2, Ven: 38 mmHg — ABNORMAL LOW (ref 44.0–60.0)
pCO2, Ven: 39.7 mmHg — ABNORMAL LOW (ref 44.0–60.0)
pH, Ven: 7.363 (ref 7.250–7.430)
pH, Ven: 7.37 (ref 7.250–7.430)
pO2, Ven: 35 mmHg (ref 32.0–45.0)
pO2, Ven: 37 mmHg (ref 32.0–45.0)

## 2021-05-14 SURGERY — RIGHT HEART CATH
Anesthesia: LOCAL

## 2021-05-14 MED ORDER — SODIUM CHLORIDE 0.9 % IV SOLN: INTRAVENOUS | Status: DC

## 2021-05-14 MED ORDER — LIDOCAINE HCL (PF) 1 % IJ SOLN
INTRAMUSCULAR | Status: AC
  Filled 2021-05-14: qty 30

## 2021-05-14 MED ORDER — HYDRALAZINE HCL 20 MG/ML IJ SOLN: 10.0000 mg | INTRAMUSCULAR | Status: DC | PRN

## 2021-05-14 MED ORDER — HYDRALAZINE HCL 25 MG PO TABS: 25.0000 mg | ORAL_TABLET | Freq: Three times a day (TID) | ORAL | 11 refills | Status: DC

## 2021-05-14 MED ORDER — HYDRALAZINE HCL 20 MG/ML IJ SOLN
INTRAMUSCULAR | Status: DC | PRN
  Administered 2021-05-14: 10 mg via INTRAVENOUS

## 2021-05-14 MED ORDER — LABETALOL HCL 5 MG/ML IV SOLN: 10.0000 mg | INTRAVENOUS | Status: DC | PRN

## 2021-05-14 MED ORDER — HEPARIN (PORCINE) IN NACL 1000-0.9 UT/500ML-% IV SOLN
INTRAVENOUS | Status: AC
  Filled 2021-05-14: qty 500

## 2021-05-14 MED ORDER — ACETAMINOPHEN 325 MG PO TABS: 650.0000 mg | ORAL_TABLET | ORAL | Status: DC | PRN

## 2021-05-14 MED ORDER — SODIUM CHLORIDE 0.9% FLUSH: 3.0000 mL | Freq: Two times a day (BID) | INTRAVENOUS | Status: DC

## 2021-05-14 MED ORDER — HEPARIN (PORCINE) IN NACL 1000-0.9 UT/500ML-% IV SOLN
INTRAVENOUS | Status: DC | PRN
  Administered 2021-05-14: 500 mL

## 2021-05-14 MED ORDER — SODIUM CHLORIDE 0.9 % IV SOLN: 250.0000 mL | INTRAVENOUS | Status: DC | PRN

## 2021-05-14 MED ORDER — HYDRALAZINE HCL 20 MG/ML IJ SOLN
INTRAMUSCULAR | Status: AC
  Filled 2021-05-14: qty 1

## 2021-05-14 MED ORDER — LIDOCAINE HCL (PF) 1 % IJ SOLN
INTRAMUSCULAR | Status: DC | PRN
  Administered 2021-05-14: 2 mL

## 2021-05-14 MED ORDER — SODIUM CHLORIDE 0.9% FLUSH: 3.0000 mL | INTRAVENOUS | Status: DC | PRN

## 2021-05-14 MED ORDER — ASPIRIN 81 MG PO CHEW
81.0000 mg | CHEWABLE_TABLET | ORAL | Status: AC
  Administered 2021-05-14: 81 mg via ORAL
  Filled 2021-05-14: qty 1

## 2021-05-14 MED ORDER — ONDANSETRON HCL 4 MG/2ML IJ SOLN: 4.0000 mg | Freq: Four times a day (QID) | INTRAMUSCULAR | Status: DC | PRN

## 2021-05-14 SURGICAL SUPPLY — 5 items
CATH BALLN WEDGE 5F 110CM (CATHETERS) ×2 IMPLANT
KIT HEART LEFT (KITS) ×2 IMPLANT
PACK CARDIAC CATHETERIZATION (CUSTOM PROCEDURE TRAY) ×2 IMPLANT
SHEATH GLIDE SLENDER 4/5FR (SHEATH) ×2 IMPLANT
TRANSDUCER W/STOPCOCK (MISCELLANEOUS) ×2 IMPLANT

## 2021-05-14 NOTE — Interval H&P Note (Signed)
History and Physical Interval Note:  05/14/2021 10:08 AM  Maria Hudson  has presented today for surgery, with the diagnosis of heart failure.  The various methods of treatment have been discussed with the patient and family. After consideration of risks, benefits and other options for treatment, the patient has consented to  Procedure(s): RIGHT HEART CATH (N/A) as a surgical intervention.  The patient's history has been reviewed, patient examined, no change in status, stable for surgery.  I have reviewed the patient's chart and labs.  Questions were answered to the patient's satisfaction.     Charell Faulk [employer]

## 2021-05-14 NOTE — Discharge Instructions (Addendum)
Start hydralazine 25 mg three times a day for blood pressure.

## 2021-05-14 NOTE — Telephone Encounter (Signed)
Auth request faxed for pyp to [facility]

## 2021-05-15 ENCOUNTER — Other Ambulatory Visit: Payer: Self-pay

## 2021-05-15 ENCOUNTER — Telehealth (HOSPITAL_COMMUNITY): Payer: Self-pay | Admitting: Licensed Clinical Social Worker

## 2021-05-15 DIAGNOSIS — Z419 Encounter for procedure for purposes other than remedying health state, unspecified: Secondary | ICD-10-CM | POA: Diagnosis not present

## 2021-05-15 NOTE — Telephone Encounter (Signed)
CSW received request from Dr Aundra Dubin to assist patient with abdominal binder and compression socks as patient's insurance does not cover the costs. CSW ordered per request and will be shipped to patient's home. CSW contacted patient who is very appreciative for assistance. CSW available as needed. Raquel Sarna, [place], [place]

## 2021-05-15 NOTE — Patient Outreach (Signed)
Medicaid Managed Care Social Work Note  05/15/2021 Name:  Daphna Lafuente MRN:  [id number] DOB:  02-Oct-1960  Rimsha Trembley is an 60 y.o. year old female who is a primary patient of Nolene Ebbs, MD.  The Medicaid Managed Care Coordination team was consulted for assistance with:   Housing  Ms. Rajagopalan was given information about Medicaid Managed Care Coordination team services today. [name] Patient agreed to services and verbal consent obtained.  Engaged with patient  for by telephone forinitial visit in response to referral for case management and/or care coordination services.   Assessments/Interventions:  Review of past medical history, allergies, medications, health status, including review of consultants reports, laboratory and other test data, was performed as part of comprehensive evaluation and provision of chronic care management services.  SDOH: (Social Determinant of Health) assessments and interventions performed:  05/15/21: BSW contacted and spoke with patient about low income housing. Patient states she does not want to die in her daughter's home and is looking for somewhere else to live. She does not have to be out by a certain date but wants to start the process. BSW informed patient housing is very hard to come by right now and patient understood. Patient has not been placed on the [employer], [place] will provide [employer] and some affordable housing options. Patient stated she would like the resources emailed to her at [email or web address].    Advanced Directives Status:  Not addressed in this encounter.  Care Plan                 Allergies  Allergen Reactions   Hydrocodone Itching    Patient able to tolerate when taken with Benadryl.   Oxycodone-Acetaminophen Itching    Patient able to tolerate when taken with Benadryl.    Medications Reviewed Today     Reviewed by Ronna Polio, RN (Registered Nurse) on 05/14/21 at [place] [facility] Status:  Complete   Medication Order Taking? Sig Documenting Provider Last Dose Status Informant  acetaminophen (TYLENOL) 500 MG tablet [id number] Yes Take 1,000 mg by mouth every 6 (six) hours as needed for moderate pain or headache. [provider] Past Month Active Self  allopurinol (ZYLOPRIM) 100 MG tablet [id number] Yes Take 100 mg by mouth daily. [provider] 05/13/2021 1600 Active Self  amLODipine (NORVASC) 10 MG tablet [id number] Yes Take 10 mg by mouth every morning. [provider] 05/13/2021 1600 Active Self  aspirin EC 81 MG tablet [id number] Yes Take 81 mg by mouth daily. Swallow whole. [provider] 05/13/2021 1600 Active Self           Med Note [name]   Mon Apr 14, 2021  9:22 PM)    atorvastatin (LIPITOR) 20 MG tablet [id number] Yes Take 20 mg by mouth every evening. [provider] 05/13/2021 2100 Active Self  buPROPion (WELLBUTRIN) 100 MG tablet [id number] Yes Take 100 mg by mouth 2 (two) times daily. [provider] 05/13/2021 1600 Active Self  carvedilol (COREG) 25 MG tablet [id number] Yes Take 1 tablet (25 mg total) by mouth 2 (two) times daily with a meal. [name], [name], MD 05/13/2021 1600 Active Self  cloNIDine (CATAPRES) 0.3 MG tablet [id number] Yes Take 1 tablet (0.3 mg total) by mouth 3 (three) times daily. [name], DO 05/13/2021 2100 Active Self  diclofenac Sodium (VOLTAREN) 1 % GEL [id number] Yes Apply 2 g topically 4 (four) times daily.  Patient taking differently: Apply 2 g topically 2 (two) times  daily as needed (pain).   [name], DO Past Week Active Self           Med Note [name], [name] May 06, 2021 12:59 PM) Use to treat left sided chest pain- non cardiac pain  doxazosin (CARDURA) 8 MG tablet [id number] Yes Take 1 tablet (8 mg total) by mouth every evening. [name], DO 05/13/2021 2100 Active Self  fluticasone (FLONASE) 50 MCG/ACT nasal spray [id number] No Place 1 spray into  both nostrils daily as needed for allergies or rhinitis. [provider] More than a month Active Self  insulin glargine (LANTUS) 100 unit/mL SOPN [id number] Yes Inject 10 Units into the skin at bedtime. [provider] 05/13/2021 2100 Active Self           Med Note [name], MELISSA R   Tue May 06, 2021  3:37 PM)    isosorbide mononitrate (IMDUR) 60 MG 24 hr tablet [id number] Yes Take 1 tablet (60 mg total) by mouth daily. Larey Dresser, MD 05/13/2021 2100 Active Self  loratadine (CLARITIN) 10 MG tablet [id number] Yes Take 10 mg by mouth daily. [provider] 05/13/2021 0700 Active Self  Multiple Vitamin (MULTIVITAMIN ADULT) TABS [id number] Yes Take 1 tablet by mouth daily. [provider] Past Week Active Self  nitroGLYCERIN (NITROSTAT) 0.4 MG SL tablet [id number] Yes Place 1 tablet (0.4 mg total) under the tongue every 5 (five) minutes as needed for chest pain. [name], [name], MD  Active Self           Med Note Duffy Bruce, [name]   Tue Apr 29, 2021  9:30 PM)    NOVOLOG FLEXPEN 100 UNIT/ML FlexPen [id number] Yes Inject 8 Units into the skin 3 (three) times daily with meals. [provider] 05/13/2021 2100 Active Self           Med Note [name], MELISSA R   Tue May 06, 2021  5:01 PM)    Omega-3 Fatty Acids (FISH OIL) 1000 MG CAPS [id number] Yes Take 1,000 mg by mouth daily. [provider] Past Week Active Self  pantoprazole (PROTONIX) 40 MG tablet [id number] Yes Take 1 tablet (40 mg total) by mouth daily. [name], DO 05/13/2021 0700 Active Self  polyethylene glycol (MIRALAX / GLYCOLAX) 17 g packet [id number] Yes Take 17 g by mouth daily. [name], [name], MD 05/13/2021 0700 Active Self  pregabalin (LYRICA) 25 MG capsule [id number] Yes Take 25 mg by mouth 2 (two) times daily. [provider] 05/13/2021 0700 Active Self  sertraline (ZOLOFT) 50 MG tablet [id number] Yes Take 50 mg by mouth daily. [provider]  05/13/2021 0700 Active Self  spironolactone (ALDACTONE) 25 MG tablet [id number] Yes Take 1 tablet (25 mg total) by mouth daily. [name], DO 05/13/2021 0700 Active Self  sucralfate (CARAFATE) 1 GM/10ML suspension [id number] Yes Take 10 mLs (1 g total) by mouth 4 (four) times daily -  with meals and at bedtime for 6 days. [name], DO  Expired 05/09/21 2359 Self  torsemide (DEMADEX) 20 MG tablet [id number] Yes Take 1 tablet (20 mg total) by mouth every other day. Larey Dresser, MD 05/13/2021 0700 Active Self  traZODone (DESYREL) 50 MG tablet [id number] Yes Take 50 mg by mouth at bedtime. [provider] 05/13/2021 2100 Active Self            Patient Active Problem List   Diagnosis Date Noted   Chronic diastolic CHF (congestive heart failure) ([place])  04/29/2021   Hypertensive urgency 04/14/2021   Acute on chronic diastolic CHF (congestive heart failure) ([place]) 04/14/2021   Chest pain 03/05/2021   CAD (coronary artery disease) 03/05/2021   T2DM (type 2 diabetes mellitus) ([place]) 03/05/2021   CKD (chronic kidney disease) stage 4, GFR 15-29 ml/min (HCC) 03/05/2021   Class 3 obesity ([place]) 03/05/2021   CVA (cerebral vascular accident) ([place]) 03/05/2021   Acute on chronic diastolic heart failure ([place]) 03/05/2021   Essential hypertension 03/05/2021   AKI (acute kidney injury) ([place]) 03/05/2021    Conditions to be addressed/monitored per PCP order:   housing  Care Plan : [place]  Updates made by Ethelda Chick since 05/15/2021 12:00 AM     Problem: Knowledge Deficit and Care Coordination Needs Related to Management of HTN, HF, Type 2 DM   Priority: High     Long-Range Goal: Development of Plan of Care to Address Knowledge Deficits and Care Coordination Needs related to  management of CHF, CAD, HTN, HLD, DMII, CKD Stage 4, and Depression   Start Date: 05/06/2021  Expected End Date: 09/08/2021  Priority: High  Note:   Current Barriers:  Knowledge  Deficits related to plan of care for management of CHF, CAD, HTN, HLD, DMII, CKD Stage 4, and Depression: depressed mood loss of energy/fatigue insomnia disturbed sleep decreased appetite difficulty concentrating recurrent thoughts of death  Care Coordination needs related to Housing barriers and Mental Health Concerns    RNCM Clinical Goal(s):  Patient will demonstrate ongoing adherence to prescribed treatment plan for CHF, CAD, HTN, HLD, DMII, CKD Stage 4, and Depression as evidenced by patient report of improved health and quality of life and no unplanned ED or unplanned hospital admissions continue to work with RN Care Manager and/or Social Worker to address care management and care coordination needs related to CHF, CAD, HTN, HLD, DMII, CKD Stage 4, and Depression as evidenced by adherence to CM Team Scheduled appointments     work with pharmacist to address medication review and discuss ? medication side effect of chronic constipation and medication blister packs related to CHF, CAD, HTN, HLD, DMII, CKD Stage 4, and Depression as evidenced by review of EMR and patient or pharmacist report    through collaboration with RN Care manager, provider, and care team.  work with managed Medicaid LCSW to address mental health concerns related to the management of depression work with managed Medicaid BSW to address needs related to housing barriers as evidenced by patient receiving resources from BSW for low income housing options in [place]   Interventions: Mail health education to patient with After Visit Summary Inter-disciplinary care team collaboration (see longitudinal plan of care) Evaluation of current treatment plan related to  self management and patient's adherence to plan as established by provider 05/15/21: BSW contacted and spoke with patient about low income housing. Patient states she does not want to die in her daughter's home and is looking for somewhere else to live. She does not  have to be out by a certain date but wants to start the process. BSW informed patient housing is very hard to come by right now and patient understood. Patient has not been placed on the [employer], [place] will provide [employer] and some affordable housing options. Patient stated she would like the resources emailed to her at [email or web address].    Heart Failure Interventions:  (Status: New goal.)  Long Term Goal  Basic overview and discussion of pathophysiology of Heart  Failure reviewed  Diabetes:  (Status: New goal.) Long Term Goal   Lab Results  Component Value Date   HGBA1C 6.7 (H) 04/29/2021  Assessed patient's understanding of A1C goal: <7% Provided education to patient about basic DM disease process; Reviewed medications with patient and discussed importance of medication adherence;        Counseled on importance of regular laboratory monitoring as prescribed;        Discussed plans with patient for ongoing care management follow up and provided patient with direct contact information for care management team;      Provided patient with written educational materials related to hypo and hyperglycemia and importance of correct treatment;       Advised patient, providing education and rationale, to check cbg three times daily and record        call provider for findings outside established parameters;       Review of patient status, including review of consultants reports, relevant laboratory and other test results, and medications completed;        Hypertension: (Status: New goal.) Last practice recorded BP readings:  BP Readings from Last 3 Encounters:  05/05/21 (!) 160/80  05/03/21 (!) 177/68  04/24/21 (!) 160/70  Most recent eGFR/CrCl:  Lab Results  Component Value Date   EGFR 19 (L) 03/13/2021    No components found for: CRCL  Evaluation of current treatment plan related to hypertension self management and patient's adherence to plan as established by  provider;   Reviewed medications with patient and discussed importance of compliance;  Discussed plans with patient for ongoing care management follow up and provided patient with direct contact information for care management team; Advised patient, providing education and rationale, to monitor blood pressure daily and record, calling PCP for findings outside established parameters;  Reviewed scheduled/upcoming provider appointments including:   Interdisciplinary Collaboration:  (Status: New goal.) Long Term Goal  Collaborated with BSW to initiate plan of care to address needs related to Housing barriers in patient with CHF, CAD, HTN, HLD, DM, CKD Stage 4, and Depression Collaboration with LCSW to address mental health needs related to treatment of depression Collaboration with pharmacist for medication review, discussion of ? Medication side effects causing chronic constipation and arranging for patient to received home delivery of medications in blister packs Collaboration with Nolene Ebbs, MD regarding development and update of comprehensive plan of care as evidenced by provider attestation and co-signature Inter-disciplinary care team collaboration (see longitudinal plan of care)  Patient Goals/Self-Care Activities: Take medications as prescribed   Attend all scheduled provider appointments Call pharmacy for medication refills 3-7 days in advance of running out of medications Attend church or other social activities Perform all self care activities independently  Call provider office for new concerns or questions  Work with the social worker to address care coordination needs and will continue to work with the clinical team to address health care and disease management related needs call office if I gain more than 2 pounds in one day or 5 pounds in one week keep legs up while sitting track weight in diary use salt in moderation watch for swelling in feet, ankles and legs every  day weigh myself daily know when to call the doctorfor fluid weight gain or consistently abnormal BP readings at home track symptoms and what helps feel better or worse check blood sugar at prescribed times: three times daily enter blood sugar readings and medication or insulin into daily log take the blood sugar log  to all doctor visits take the blood sugar meter to all doctor visits eat fish at least once per week fill half of plate with vegetables manage portion size check blood pressure daily write blood pressure results in a log or diary learn about high blood pressure take blood pressure log to all doctor appointments call doctor for signs and symptoms of high blood pressure keep all doctor appointments take medications for blood pressure exactly as prescribed Limit fluid intake to 2 liters per day Review health education received in mail Call health plan customer service to discuss benefits related to wellness, [employer], behavioral health, and over the counter health care items Collaborate with managed Medicaid team of LCSW, BSW and pharmacist to address identified needs       Follow up:  Patient agrees to Care Plan and Follow-up.  Plan: The Managed Medicaid care management team will reach out to the patient again over the next 30 days.  Date/time of next scheduled Social Work care management/care coordination outreach:  06/16/20  Mickel Fuchs, Arita Miss, [place] Medicaid Team  [phone]

## 2021-05-15 NOTE — Telephone Encounter (Signed)
Auth for pyp approved.  Auth # [id number]    Valid 05/14/21-07/13/21

## 2021-05-15 NOTE — Patient Instructions (Signed)
Visit Information  Maria Hudson was given information about Medicaid Managed Care team care coordination services as a part of their [facility] Medicaid benefit. Aniko Finnigan verbally consented to engagement with the [facility] Managed Care team.   If you are experiencing a medical emergency, please call 911 or report to your local emergency department or urgent care.   If you have a non-emergency medical problem during routine business hours, please contact your provider's office and ask to speak with a nurse.   For questions related to your [facility] health plan, please call: [phone] or go here:[email or web address]  If you would like to schedule transportation through your [facility] plan, please call the following number at least 2 days in advance of your appointment: [phone].  Call the [place] at [phone], at any time, 24 hours a day, 7 days a week. If you are in danger or need immediate medical attention call 911.  If you would like help to quit smoking, call 1-800-QUIT-NOW 210-870-8177) OR Espaol: 1-855-Djelo-Ya (4-132-440-1027) o para ms informacin haga clic aqu or Text READY to 200-400 to register via text  Ms. Steve - following are the goals we discussed in your visit today:   Goals Addressed   None       Social Worker will follow up in 30 days.   Mickel Fuchs, BSW, [place]  High Risk Managed Medicaid Team  [phone]   Following is a copy of your plan of care:  Care Plan : [place]  Updates made by Ethelda Chick since 05/15/2021 12:00 AM     Problem: Knowledge Deficit and Care Coordination Needs Related to Management of HTN, HF, Type 2 DM   Priority: High     Long-Range Goal: Development of Plan of Care to Address Knowledge Deficits and Care Coordination Needs related to  management of CHF, CAD, HTN, HLD, DMII, CKD Stage 4, and Depression    Start Date: 05/06/2021  Expected End Date: 09/08/2021  Priority: High  Note:   Current Barriers:  Knowledge Deficits related to plan of care for management of CHF, CAD, HTN, HLD, DMII, CKD Stage 4, and Depression: depressed mood loss of energy/fatigue insomnia disturbed sleep decreased appetite difficulty concentrating recurrent thoughts of death  Care Coordination needs related to Housing barriers and Mental Health Concerns    RNCM Clinical Goal(s):  Patient will demonstrate ongoing adherence to prescribed treatment plan for CHF, CAD, HTN, HLD, DMII, CKD Stage 4, and Depression as evidenced by patient report of improved health and quality of life and no unplanned ED or unplanned hospital admissions continue to work with RN Care Manager and/or Social Worker to address care management and care coordination needs related to CHF, CAD, HTN, HLD, DMII, CKD Stage 4, and Depression as evidenced by adherence to CM Team Scheduled appointments     work with pharmacist to address medication review and discuss ? medication side effect of chronic constipation and medication blister packs related to CHF, CAD, HTN, HLD, DMII, CKD Stage 4, and Depression as evidenced by review of EMR and patient or pharmacist report    through collaboration with RN Care manager, provider, and care team.  work with managed Medicaid LCSW to address mental health concerns related to the management of depression work with managed Medicaid BSW to address needs related to housing barriers as evidenced by patient receiving resources from BSW for low income housing options in [place]   Interventions:  Mail health education to patient with After Visit Summary Inter-disciplinary care team collaboration (see longitudinal plan of care) Evaluation of current treatment plan related to  self management and patient's adherence to plan as established by provider 05/15/21: BSW contacted and spoke with patient about low income housing.  Patient states she does not want to die in her daughter's home and is looking for somewhere else to live. She does not have to be out by a certain date but wants to start the process. BSW informed patient housing is very hard to come by right now and patient understood. Patient has not been placed on the [employer], [place] will provide [employer] and some affordable housing options. Patient stated she would like the resources emailed to her at [email or web address].    Heart Failure Interventions:  (Status: New goal.)  Long Term Goal  Basic overview and discussion of pathophysiology of Heart Failure reviewed  Diabetes:  (Status: New goal.) Long Term Goal   Lab Results  Component Value Date   HGBA1C 6.7 (H) 04/29/2021  Assessed patient's understanding of A1C goal: <7% Provided education to patient about basic DM disease process; Reviewed medications with patient and discussed importance of medication adherence;        Counseled on importance of regular laboratory monitoring as prescribed;        Discussed plans with patient for ongoing care management follow up and provided patient with direct contact information for care management team;      Provided patient with written educational materials related to hypo and hyperglycemia and importance of correct treatment;       Advised patient, providing education and rationale, to check cbg three times daily and record        call provider for findings outside established parameters;       Review of patient status, including review of consultants reports, relevant laboratory and other test results, and medications completed;        Hypertension: (Status: New goal.) Last practice recorded BP readings:  BP Readings from Last 3 Encounters:  05/05/21 (!) 160/80  05/03/21 (!) 177/68  04/24/21 (!) 160/70  Most recent eGFR/CrCl:  Lab Results  Component Value Date   EGFR 19 (L) 03/13/2021    No components found for: CRCL  Evaluation  of current treatment plan related to hypertension self management and patient's adherence to plan as established by provider;   Reviewed medications with patient and discussed importance of compliance;  Discussed plans with patient for ongoing care management follow up and provided patient with direct contact information for care management team; Advised patient, providing education and rationale, to monitor blood pressure daily and record, calling PCP for findings outside established parameters;  Reviewed scheduled/upcoming provider appointments including:   Interdisciplinary Collaboration:  (Status: New goal.) Long Term Goal  Collaborated with BSW to initiate plan of care to address needs related to Housing barriers in patient with CHF, CAD, HTN, HLD, DM, CKD Stage 4, and Depression Collaboration with LCSW to address mental health needs related to treatment of depression Collaboration with pharmacist for medication review, discussion of ? Medication side effects causing chronic constipation and arranging for patient to received home delivery of medications in blister packs Collaboration with Nolene Ebbs, MD regarding development and update of comprehensive plan of care as evidenced by provider attestation and co-signature Inter-disciplinary care team collaboration (see longitudinal plan of care)  Patient Goals/Self-Care Activities: Take medications as prescribed   Attend all scheduled provider appointments Call pharmacy for  medication refills 3-7 days in advance of running out of medications Attend church or other social activities Perform all self care activities independently  Call provider office for new concerns or questions  Work with the social worker to address care coordination needs and will continue to work with the clinical team to address health care and disease management related needs call office if I gain more than 2 pounds in one day or 5 pounds in one week keep legs up  while sitting track weight in diary use salt in moderation watch for swelling in feet, ankles and legs every day weigh myself daily know when to call the doctorfor fluid weight gain or consistently abnormal BP readings at home track symptoms and what helps feel better or worse check blood sugar at prescribed times: three times daily enter blood sugar readings and medication or insulin into daily log take the blood sugar log to all doctor visits take the blood sugar meter to all doctor visits eat fish at least once per week fill half of plate with vegetables manage portion size check blood pressure daily write blood pressure results in a log or diary learn about high blood pressure take blood pressure log to all doctor appointments call doctor for signs and symptoms of high blood pressure keep all doctor appointments take medications for blood pressure exactly as prescribed Limit fluid intake to 2 liters per day Review health education received in mail Call health plan customer service to discuss benefits related to wellness, [employer] Complete, behavioral health, and over the counter health care items Collaborate with managed Medicaid team of LCSW, BSW and pharmacist to address identified needs

## 2021-05-16 ENCOUNTER — Other Ambulatory Visit: Payer: Self-pay | Admitting: Licensed Clinical Social Worker

## 2021-05-16 ENCOUNTER — Ambulatory Visit (HOSPITAL_COMMUNITY)
Admission: RE | Admit: 2021-05-16 | Discharge: 2021-05-16 | Disposition: A | Discharge status: Home / Self Care | Payer: Medicaid Other | Source: Ambulatory Visit | Attending: Cardiology | Admitting: Cardiology

## 2021-05-16 ENCOUNTER — Other Ambulatory Visit: Payer: Self-pay

## 2021-05-16 ENCOUNTER — Ambulatory Visit (HOSPITAL_BASED_OUTPATIENT_CLINIC_OR_DEPARTMENT_OTHER): Payer: Medicaid Other | Admitting: Family

## 2021-05-16 DIAGNOSIS — F4311 Post-traumatic stress disorder, acute: Secondary | ICD-10-CM

## 2021-05-16 DIAGNOSIS — I5032 Chronic diastolic (congestive) heart failure: Secondary | ICD-10-CM | POA: Diagnosis not present

## 2021-05-16 LAB — BASIC METABOLIC PANEL
Anion gap: 7 (ref 5–15)
BUN: 74 mg/dL — ABNORMAL HIGH (ref 6–20)
CO2: 21 mmol/L — ABNORMAL LOW (ref 22–32)
Calcium: 8.6 mg/dL — ABNORMAL LOW (ref 8.9–10.3)
Chloride: 110 mmol/L (ref 98–111)
Creatinine, Ser: 3.46 mg/dL — ABNORMAL HIGH (ref 0.44–1.00)
GFR, Estimated: 15 mL/min — ABNORMAL LOW (ref >60)
Glucose, Bld: 56 mg/dL — ABNORMAL LOW (ref 70–99)
Potassium: 4.4 mmol/L (ref 3.5–5.1)
Sodium: 138 mmol/L (ref 135–145)

## 2021-05-16 NOTE — Patient Outreach (Addendum)
Medicaid Managed Care Social Work Note  05/16/2021 Name:  Maria Hudson MRN:  [id number] DOB:  1960-08-02  Maria Hudson is an 60 y.o. year old female who is a primary patient of Maria Ebbs, MD.  The Medicaid Managed Care Coordination team was consulted for assistance with:  Maria Hudson and Resources  Maria Hudson was given information about Medicaid Managed Care Coordination team services today. Maria Hudson Patient agreed to services and verbal consent obtained.  Engaged with patient  for by telephone forinitial visit in response to referral for case management and/or care coordination services.   Assessments/Interventions:  Review of past medical history, allergies, medications, health status, including review of consultants reports, laboratory and other test data, was performed as part of comprehensive evaluation and provision of chronic care management services.  SDOH: (Social Determinant of Health) assessments and interventions performed: SDOH Interventions    Flowsheet Row Most Recent Value  SDOH Interventions   Stress Interventions Provide Counseling, Offered [employer] Resources       Advanced Directives Status:  See Care Plan for related entries.  Care Plan                 Allergies  Allergen Reactions   Hydrocodone Itching    Patient able to tolerate when taken with Benadryl.   Oxycodone-Acetaminophen Itching    Patient able to tolerate when taken with Benadryl.    Medications Reviewed Today     Reviewed by Ronna Polio, RN (Registered Nurse) on 05/14/21 at [place] List Status: Complete   Medication Order Taking? Sig Documenting Provider Last Dose Status Informant  acetaminophen (TYLENOL) 500 MG tablet [id number] Yes Take 1,000 mg by mouth every 6 (six) hours as needed for moderate pain or headache. [provider] Past Month Active Self  allopurinol (ZYLOPRIM) 100 MG tablet [id number] Yes Take 100 mg by mouth daily.  [provider] 05/13/2021 1600 Active Self  amLODipine (NORVASC) 10 MG tablet [id number] Yes Take 10 mg by mouth every morning. [provider] 05/13/2021 1600 Active Self  aspirin EC 81 MG tablet [id number] Yes Take 81 mg by mouth daily. Swallow whole. [provider] 05/13/2021 1600 Active Self           Med Note [name]   Mon Apr 14, 2021  9:22 PM)    atorvastatin (LIPITOR) 20 MG tablet [id number] Yes Take 20 mg by mouth every evening. [provider] 05/13/2021 2100 Active Self  buPROPion (WELLBUTRIN) 100 MG tablet [id number] Yes Take 100 mg by mouth 2 (two) times daily. [provider] 05/13/2021 1600 Active Self  carvedilol (COREG) 25 MG tablet [id number] Yes Take 1 tablet (25 mg total) by mouth 2 (two) times daily with a meal. [name], [name], MD 05/13/2021 1600 Active Self  cloNIDine (CATAPRES) 0.3 MG tablet [id number] Yes Take 1 tablet (0.3 mg total) by mouth 3 (three) times daily. [name], DO 05/13/2021 2100 Active Self  diclofenac Sodium (VOLTAREN) 1 % GEL [id number] Yes Apply 2 g topically 4 (four) times daily.  Patient taking differently: Apply 2 g topically 2 (two) times daily as needed (pain).   [name], DO Past Week Active Self           Med Note [name], [name] May 06, 2021 12:59 PM) Use to treat left sided chest pain- non cardiac pain  doxazosin (CARDURA) 8 MG tablet [id number] Yes Take 1 tablet (8 mg total) by mouth every evening. Eliseo Squires,  [name], DO 05/13/2021 2100 Active Self  fluticasone (FLONASE) 50 MCG/ACT nasal spray [id number] No Place 1 spray into both nostrils daily as needed for allergies or rhinitis. [provider] More than a month Active Self  insulin glargine (LANTUS) 100 unit/mL SOPN [id number] Yes Inject 10 Units into the skin at bedtime. [provider] 05/13/2021 2100 Active Self           Med Note [name], MELISSA R   Tue May 06, 2021  3:37 PM)    isosorbide  mononitrate (IMDUR) 60 MG 24 hr tablet [id number] Yes Take 1 tablet (60 mg total) by mouth daily. Larey Dresser, MD 05/13/2021 2100 Active Self  loratadine (CLARITIN) 10 MG tablet [id number] Yes Take 10 mg by mouth daily. [provider] 05/13/2021 0700 Active Self  Multiple Vitamin (MULTIVITAMIN ADULT) TABS [id number] Yes Take 1 tablet by mouth daily. [provider] Past Week Active Self  nitroGLYCERIN (NITROSTAT) 0.4 MG SL tablet [id number] Yes Place 1 tablet (0.4 mg total) under the tongue every 5 (five) minutes as needed for chest pain. [name], [name], MD  Active Self           Med Note Duffy Bruce, [name]   Tue Apr 29, 2021  9:30 PM)    NOVOLOG FLEXPEN 100 UNIT/ML FlexPen [id number] Yes Inject 8 Units into the skin 3 (three) times daily with meals. [provider] 05/13/2021 2100 Active Self           Med Note [name], MELISSA R   Tue May 06, 2021  5:01 PM)    Omega-3 Fatty Acids (FISH OIL) 1000 MG CAPS [id number] Yes Take 1,000 mg by mouth daily. [provider] Past Week Active Self  pantoprazole (PROTONIX) 40 MG tablet [id number] Yes Take 1 tablet (40 mg total) by mouth daily. [name], DO 05/13/2021 0700 Active Self  polyethylene glycol (MIRALAX / GLYCOLAX) 17 g packet [id number] Yes Take 17 g by mouth daily. [name], [name], MD 05/13/2021 0700 Active Self  pregabalin (LYRICA) 25 MG capsule [id number] Yes Take 25 mg by mouth 2 (two) times daily. [provider] 05/13/2021 0700 Active Self  sertraline (ZOLOFT) 50 MG tablet [id number] Yes Take 50 mg by mouth daily. [provider] 05/13/2021 0700 Active Self  spironolactone (ALDACTONE) 25 MG tablet [id number] Yes Take 1 tablet (25 mg total) by mouth daily. [name], DO 05/13/2021 0700 Active Self  sucralfate (CARAFATE) 1 GM/10ML suspension [id number] Yes Take 10 mLs (1 g total) by mouth 4 (four) times daily -  with meals and at bedtime for 6 days. [name], DO   Expired 05/09/21 2359 Self  torsemide (DEMADEX) 20 MG tablet [id number] Yes Take 1 tablet (20 mg total) by mouth every other day. Larey Dresser, MD 05/13/2021 0700 Active Self  traZODone (DESYREL) 50 MG tablet [id number] Yes Take 50 mg by mouth at bedtime. [provider] 05/13/2021 2100 Active Self            Patient Active Problem List   Diagnosis Date Noted   Chronic diastolic CHF (congestive heart failure) ([place]) 04/29/2021   Hypertensive urgency 04/14/2021   Acute on chronic diastolic CHF (congestive heart failure) ([place]) 04/14/2021   Chest pain 03/05/2021   CAD (coronary artery disease) 03/05/2021   T2DM (type 2 diabetes mellitus) ([place]) 03/05/2021   CKD (chronic kidney disease) stage 4, GFR 15-29 ml/min ([place]) 03/05/2021   Class 3 obesity ([place]) 03/05/2021   CVA (cerebral vascular accident) ([place])  03/05/2021   Acute on chronic diastolic heart failure ([place]) 03/05/2021   Essential hypertension 03/05/2021   AKI (acute kidney injury) ([place]) 03/05/2021    Conditions to be addressed/monitored per PCP order:  Depression   Timeframe:  Long-Range Goal Priority:  High Start Date:    05/16/21                    Expected End Date:   ongoing                     Follow Up Date- 06/04/21 at 12:45   Current barriers:   Chronic Mental Health needs related to depression Mental Health Concerns  and Social Isolation Needs Support, Education, and Care Coordination in order to meet unmet mental health needs. Clinical Goal(s): demonstrate a reduction in symptoms related to :Depression: depressed mood  verbalize understanding of plan for management of Depression   Clinical Interventions:  Assessed patient's previous and current treatment, coping skills, support system and barriers to care  Depression screen reviewed  Active listening / Reflection utilized  Behavioral Activation reviewed Provided brief CBT  Suicidal Ideation/Homicidal Ideation assessed: Discussed Guardianship and  reviewed process  [place] of Attorney  Discussed referral for psychiatry  Made referral to [facility] ; Review resources, discussed options and provided patient information about  Options for mental health treatment based on need and insurance Inter-disciplinary care team collaboration (see longitudinal plan of care) Patient moved from [place] to [place] of this year. Patient has never received psychotherapy but is on medications to treat her depression. Patient denies thoughts of harming herself. Crisis resource education provided.     24- Hour Availability:    [facility]  [address] [place], [place] Crisis [phone]   [facility] of the [employer] [place]  [phone]    [place]  [phone] (after hours)   Therapeutic Alternative/Mobile Crisis   [phone]   [place] National Suicide Hotline  956-577-7423 [place]am[place]ina Monks) [place] 988   Call 911 or go to emergency room   [facility]  [phone]);  [place] and [employer]  [phone]); [place], [place], [place], [place], [place], [place], [place]         10 LITTLE Things To Do When You're Feeling Too Down To Do Anything  Take a shower. Even if you plan to stay in all day long and not see a soul, take a shower. It takes the most effort to hop in to the shower but once you do, you'll feel immediate results. It will wake you up and you'll be feeling much fresher (and cleaner too).  Brush and floss your teeth. Give your teeth a good brushing with a floss finish. It's a small task but it feels so good and you can check 'taking care of your health' off the list of things to do.  Do something small on your list. Most of [place] have some small thing we would like to get done (load of laundry, sew a button, email a friend). Doing one of these things will make you feel  like you've accomplished something.  Drink water. Drinking water is easy right? It's also really beneficial for your health so keep a glass beside you all day and take sips often. It gives you energy and prevents you from boredom eating.  Do some floor exercises. The last thing you want to do  is exercise but it might be just the thing you need the most. Keep it simple and do exercises that involve sitting or laying on the floor. Even the smallest of exercises release chemicals in the brain that make you feel good. Yoga stretches or core exercises are going to make you feel good with minimal effort.  Make your bed. Making your bed takes a few minutes but it's productive and you'll feel relieved when it's done. An unmade bed is a huge visual reminder that you're having an unproductive day. Do it and consider it your housework for the day.  Put on some nice clothes. Take the sweatpants off even if you don't plan to go anywhere. Put on clothes that make you feel good. Take a look in the mirror so your brain recognizes the sweatpants have been replaced with clothes that make you look great. It's an instant confidence booster.  Wash the dishes. A pile of dirty dishes in the sink is a reflection of your mood. It's possible that if you wash up the dishes, your mood will follow suit. It's worth a try.  Cook a real meal. If you have the luxury to have a "do nothing" day, you have time to make a real meal for yourself. Make a meal that you love to eat. The process is good to get you out of the funk and the food will ensure you have more energy for tomorrow.  Write out your thoughts by hand. When you hand write, you stimulate your brain to focus on the moment that you're in so make yourself comfortable and write whatever comes into your mind. Put those thoughts out on paper so they stop spinning around in your head. Those thoughts might be the very thing holding you down.  Patient Goals/Self-Care  Activities: Over the next 120 days I have placed a referral for counseling and medication management with [facility] they will contact you.    Follow up:  Patient agrees to Care Plan and Follow-up.  Plan: The Managed Medicaid care management team will reach out to the patient again over the next 30 days.  Date/time of next scheduled Social Work care management/care coordination outreach:  06/04/21 at 12:45  Eula Fried, [place], MSW, [place] Medicaid LCSW [email or web address] Phone: [phone]

## 2021-05-17 NOTE — Patient Instructions (Signed)
Visit Information  Maria Hudson was given information about Medicaid Managed Care team care coordination services as a part of their [facility] Medicaid benefit. Maria Hudson verbally consented to engagement with the [facility] Managed Care team.   If you are experiencing a medical emergency, please call 911 or report to your local emergency department or urgent care.   If you have a non-emergency medical problem during routine business hours, please contact your provider's office and ask to speak with a nurse.   For questions related to your [facility] health plan, please call: [phone] or go here:[email or web address]  If you would like to schedule transportation through your [facility] plan, please call the following number at least 2 days in advance of your appointment: [phone].  Call the [place] at [phone], at any time, 24 hours a day, 7 days a week. If you are in danger or need immediate medical attention call 911.  If you would like help to quit smoking, call 1-800-QUIT-NOW 716-574-7537) OR Espaol: 1-855-Djelo-Ya (4-492-010-0712) o para ms informacin haga clic aqu or Text READY to 200-400 to register via text    Following is a copy of your plan of care:  Care Plan : General Social Work (Adult)  Updates made by Greg Cutter, LCSW since 05/17/2021 12:00 AM     Problem: Anxiety Identification (Anxiety)      Long-Range Goal: Anxiety Symptoms Identified   Start Date: 05/16/2021  Priority: High  Note:    Timeframe:  Long-Range Goal Priority:  High Start Date:    05/16/21                    Expected End Date:   ongoing                     Follow Up Date- 06/04/21 at 12:45   Current barriers:   Chronic Mental Health needs related to depression Mental Health Concerns  and Social Isolation Needs Support, Education, and Care Coordination in order to meet unmet mental health needs. Clinical Goal(s): demonstrate a  reduction in symptoms related to :Depression: depressed mood  verbalize understanding of plan for management of Depression   Inter-disciplinary care team collaboration (see longitudinal plan of care) Patient Goals/Self-Care Activities: Over the next 120 days I have placed a referral for counseling and medication management with [facility] they will contact you.  Maria Hudson, BSW, MSW, [facility] Managed Medicaid [email or web address] [email or web address] Phone: [phone]

## 2021-05-22 ENCOUNTER — Ambulatory Visit (HOSPITAL_COMMUNITY): Admission: RE | Admit: 2021-05-22 | Payer: Medicaid Other | Source: Ambulatory Visit

## 2021-05-22 ENCOUNTER — Encounter (HOSPITAL_COMMUNITY): Payer: Medicaid Other

## 2021-05-25 NOTE — Progress Notes (Addendum)
PCP: Nolene Ebbs, MD HF Cardiology: Dr. Aundra Dubin  60 y.o. with history of supine hypertension with orthostatic hypotension, CKD stage 4, CAD, and chronic diastolic CHF was referred from [facility] clinic for evaluation of CHF.    Patient had MI in 05/2013. LHC showed 20% LAD, 50 to 60% left circumflex, and 100% RPDA stenosis with left-to-right collaterals. CAD managed medically.    She reports a long h/o diastolic heart failure and frequent hospitalizations. She moved to [place] from [facility], had been followed by [facility]. Has struggled w/ volume control and readjustment of diuretics over the years. She reports previously being prescribed Wilder Glade but had to stop b/c her insurance would not cover it. Her last echo at [facility] was 7/21. LVEF was 55%, RV normal. RVSP elevated at 60 mmHg. PYP at [facility] was negative for amyloid (grade 1, H/CL 1.21). Serum IFE not suggestive of monoclonal gammopathy. Urine IFE and immunoglobin free light chains were elevated (lg FLCK 9.24, Ig FLCL 3.75, K/L ratio 2.46. Repeat testing recommended 6 months later, but do not see that this was completed).    Referred to HTN clinic 9/22 for uncontrolled HTN. Now on multidrug regimen. Thyroid function, renin, and aldosterone were normal, as were catecholamines and metanephrines. Her PTH was elevated at 134 but calcium was normal. Renal artery doppler study was limited. There was no evidence of right RAS however the left renal artery was not well visualized due to the presence of bowel gas.  Echo was repeated 9/22 showing normal LVEF 55-60%, G1DD and normal RV.    She has struggled w/ fluid management recently, c/b stage IV CKD. Baseline SCr ~2.3.  Followed by nephrology. Previously on Bumex and metolazone. She was admitted 18/84 for a/c diastolic CHF and diuresed w/ IV Lasix. Did not get RHC. Transitioned back to PO diuretics, Torsemide 40 mg once daily + spiro 25 mg daily. D/w Wt 231 lb. Referred to [facility] clinic.   She was admitted again in  11/22 with chest pain, AKI, and orthostatic hypotension.  Torsemide was stopped.  HS-TnI was negative, suspect musculoskeletal chest pain.    Seen for HF f/u 11/22. Continued to struggle with orthostatic symptoms. Prescribed compression stockings and abdominal binder. Imdur reduced. Mildly volume up and Torsemide restarted.   [place] 05/14/21 with normal filling pressures but prominent v-waves in PCWP tracing (no significant MR on echo, likely d/t diastolic dysfunction), mild pulmonary venous hypertension, CO  Repeat PYP scan scheduled for today.   Here today for close f/u. She reports she has not picked up abdominal binder or ted hose. She has been unable to find DME supplier that takes Medicaid. Reports lightheadedness with position changes. No syncopal episodes.   Reports she called her Nephrologist last week d/t concern for volume overload. She had gained 18 lb in 2 weeks. On 12/08 Torsemide increased to 40 q am/20 q pm X 3 days then reduced back to 20 mg daily. Reports she has lost 7 lb so far. Still has some dyspnea, abdominal bloating and leg edema. Denies CP. Believes trigger may have been cooking prepared by her daughter, she didn't realize some of the ingredients were high in sodium until after the fact.   ReDS 44%  Labs (11/22): K 3.8, creatinine 3.6 => 2.62   PMH: 1. HTN: Urine catecholamines and metanephrines normal level, aldosterone normal level.  Complicated by orthostatic hypotension.   2. Orthostatic hypotension 3. CKD stage 4 4. Type 2 diabetes 5. OSA: Uses CPAP 6. CAD: LHC in 2014 with  totally occluded RCA with collaterals, medical management.  7. Chronic diastolic CHF: Echo (8/14) with EF 55%, normal RV, PASP 60 mmHg.  - PYP scan 11/20 at [facility] was negative with grade 1, H/CL 1.21.  - Echo (9/22): EF 55-60%, mild LVH, normal RV, IVC normal.  - RHC (11/22): Normal filling pressures but with prominent v-waves in PCWP tracing. No significant MR, likely d/t diastolic  dysfunction. Mild pulmonary venous hypertension. Preserved CO.   Social History   Socioeconomic History   Marital status: Divorced    Spouse name: Not on file   Number of children: 2   Years of education: Not on file   Highest education level: Some college, no degree  Occupational History   Occupation: retired  Tobacco Use   Smoking status: Never    Passive exposure: Never   Smokeless tobacco: Never  Vaping Use   Vaping Use: Never used  Substance and Sexual Activity   Alcohol use: Not Currently   Drug use: Not Currently   Sexual activity: Not on file  Other Topics Concern   Not on file  Social History Narrative   Not on file   Social Determinants of Health   Financial Resource Strain: Low Risk    Difficulty of Paying Living Expenses: Not very hard  Food Insecurity: No Food Insecurity   Worried About Charity fundraiser in the Last Year: Never true   Tribune in the Last Year: Never true  Transportation Needs: No Transportation Needs   Lack of Transportation (Medical): No   Lack of Transportation (Non-Medical): No  Physical Activity: Inactive   Days of Exercise per Week: 0 days   Minutes of Exercise per Session: 0 min  Stress: Stress Concern Present   Feeling of Stress : Very much  Social Connections: Moderately Isolated   Frequency of Communication with Friends and Family: More than three times a week   Frequency of Social Gatherings with Friends and Family: More than three times a week   Attends Religious Services: More than 4 times per year   Active Member of [employer] or Organizations: No   Attends Music therapist: Never   Marital Status: Divorced  Human resources officer Violence: Not At Risk   Fear of Current or Ex-Partner: No   Emotionally Abused: No   Physically Abused: No   Sexually Abused: No   Family History  Problem Relation Age of Onset   Hypertension Mother    Stroke Mother    Hypertension Father    Hypertension Sister     Hypertension Son    Hypertension Daughter    ROS: All systems reviewed and negative except as per HPI.   Current Outpatient Medications  Medication Sig Dispense Refill   acetaminophen (TYLENOL) 500 MG tablet Take 1,000 mg by mouth every 6 (six) hours as needed for moderate pain or headache.     allopurinol (ZYLOPRIM) 100 MG tablet Take 100 mg by mouth daily.     amLODipine (NORVASC) 10 MG tablet Take 10 mg by mouth every morning.     aspirin EC 81 MG tablet Take 81 mg by mouth daily. Swallow whole.     atorvastatin (LIPITOR) 20 MG tablet Take 20 mg by mouth every evening.     buPROPion (WELLBUTRIN) 100 MG tablet Take 100 mg by mouth 2 (two) times daily.     carvedilol (COREG) 25 MG tablet Take 1 tablet (25 mg total) by mouth 2 (two) times daily with a meal. 60  tablet 0   cloNIDine (CATAPRES) 0.3 MG tablet Take 1 tablet (0.3 mg total) by mouth 3 (three) times daily. 90 tablet 0   diclofenac Sodium (VOLTAREN) 1 % GEL Apply 2 g topically 4 (four) times daily. (Patient taking differently: Apply 2 g topically 2 (two) times daily as needed (pain).)     doxazosin (CARDURA) 8 MG tablet Take 1 tablet (8 mg total) by mouth every evening. 30 tablet 1   fluticasone (FLONASE) 50 MCG/ACT nasal spray Place 1 spray into both nostrils daily as needed for allergies or rhinitis.     hydrALAZINE (APRESOLINE) 25 MG tablet Take 1 tablet (25 mg total) by mouth 3 (three) times daily. 120 tablet 11   insulin glargine (LANTUS) 100 unit/mL SOPN Inject 10 Units into the skin at bedtime.     isosorbide mononitrate (IMDUR) 60 MG 24 hr tablet Take 1 tablet (60 mg total) by mouth daily. 90 tablet 3   loratadine (CLARITIN) 10 MG tablet Take 10 mg by mouth daily.     Multiple Vitamin (MULTIVITAMIN ADULT) TABS Take 1 tablet by mouth daily.     nitroGLYCERIN (NITROSTAT) 0.4 MG SL tablet Place 1 tablet (0.4 mg total) under the tongue every 5 (five) minutes as needed for chest pain. 20 tablet 0   NOVOLOG FLEXPEN 100 UNIT/ML  FlexPen Inject 8 Units into the skin 3 (three) times daily with meals.     Omega-3 Fatty Acids (FISH OIL) 1000 MG CAPS Take 1,000 mg by mouth daily.     pantoprazole (PROTONIX) 40 MG tablet Take 1 tablet (40 mg total) by mouth daily. 30 tablet 0   polyethylene glycol (MIRALAX / GLYCOLAX) 17 g packet Take 17 g by mouth daily. 14 each 0   pregabalin (LYRICA) 25 MG capsule Take 25 mg by mouth 2 (two) times daily.     sertraline (ZOLOFT) 50 MG tablet Take 50 mg by mouth daily.     spironolactone (ALDACTONE) 25 MG tablet Take 1 tablet (25 mg total) by mouth daily. 30 tablet 1   traZODone (DESYREL) 50 MG tablet Take 50 mg by mouth at bedtime.     sucralfate (CARAFATE) 1 GM/10ML suspension Take 10 mLs (1 g total) by mouth 4 (four) times daily -  with meals and at bedtime for 6 days. (Patient not taking: Reported on 05/26/2021) 240 mL 0   torsemide (DEMADEX) 20 MG tablet Take 1 tablet (20 mg total) by mouth daily. 30 tablet 4   No current facility-administered medications for this encounter.   BP (!) 158/78   Pulse 61   Wt 105.1 kg (231 lb 12.8 oz)   SpO2 98%   BMI 40.42 kg/m  General: NAD Neck: JVP 10 cm, no thyromegaly or thyroid nodule.  Lungs: Clear to auscultation bilaterally with normal respiratory effort. Cardiac: Nondisplaced PMI.  Regular rhythm, no murmur.  S1, S2. Abdomen: Soft, nontender, obese, no hepatosplenomegaly, no distention.  Skin: Intact without lesions or rashes.  Neurologic: Alert and oriented x 3.  Psych: Normal affect. Extremities: No clubbing or cyanosis. Trace edema. HEENT: Normal.   Assessment/Plan: 1. Supine hypertension with orthostatic hypotension: This is difficult to manage.  Resting BP remains high today but she is still orthostatic with symptoms.  Workup for secondary hypertension has been negative.  I remain concerned that autonomic neuropathy from amyloidosis could be present.  - Continue Coreg 25 mg bid, clonidine at current dose, hydralazine, spiro,  doxazosin, imdur and amlodipine.  - Graded compression stockings and abdominal binder.  Unable to find provider that accepts medicaid. Will reach out to our HF CSW for assistance.  - Has f/u in HTN clinic tomorrow 2. Acute on chronic diastolic CHF: Echo in 7/99 with EF 55-60%, normal RV.  She has struggled with volume overload in setting of CKD stage IV.   - Volume overloaded. REDS 44%. Torsemide recently increased by Nephrologist for 3 days, now back on 20 mg daily. Will increase Torsemide to 40 mg daily X 3 days then reduce back to 20 mg daily. - Continue spiro 25 mg daily - Somewhat equivocal PYP scan in 11/20 (grade 1, H/CL 1.21).  Given diastolic CHF and possible autonomic neuropathy, getting repeat PYP scan later today to see if it is now clearly positive for TTR cardiac amyloidosis.  Repeat myeloma penal and urine immunofixation 11/22 negative. - RHC (11/22): Normal filling pressures but with prominent v-waves in PCWP tracing. No significant MR, likely d/t diastolic dysfunction. Mild pulmonary venous hypertension. Preserved CO.  - Sleep study scheduled - BMET/BNP today 3. CKD stage IV: Check BMET today.  4. CAD: H/o occluded PDA with collaterals.  No chest pain.  - Continue ASA 81 daily.  - Continue atorvastatin - Continues on 60 mg Imdur daily  F/u: 4 - 6 weeks with APP to assess volume, Dr. Aundra Dubin as scheduled 08/2021  [name], Ria Comment N 05/26/2021

## 2021-05-26 ENCOUNTER — Ambulatory Visit (HOSPITAL_COMMUNITY)
Admission: RE | Admit: 2021-05-26 | Discharge: 2021-05-26 | Disposition: A | Discharge status: Home / Self Care | Payer: Medicaid Other | Source: Ambulatory Visit | Attending: Physician Assistant | Admitting: Physician Assistant

## 2021-05-26 ENCOUNTER — Encounter (HOSPITAL_COMMUNITY)
Admission: RE | Admit: 2021-05-26 | Discharge: 2021-05-26 | Disposition: A | Discharge status: Home / Self Care | Payer: Medicaid Other | Source: Ambulatory Visit | Attending: Cardiology | Admitting: Cardiology

## 2021-05-26 ENCOUNTER — Other Ambulatory Visit: Payer: Self-pay

## 2021-05-26 ENCOUNTER — Encounter (HOSPITAL_COMMUNITY): Payer: Self-pay

## 2021-05-26 VITALS — BP 158/78 | HR 61 | Wt 231.8 lb

## 2021-05-26 DIAGNOSIS — N184 Chronic kidney disease, stage 4 (severe): Secondary | ICD-10-CM | POA: Diagnosis not present

## 2021-05-26 DIAGNOSIS — Z7982 Long term (current) use of aspirin: Secondary | ICD-10-CM | POA: Insufficient documentation

## 2021-05-26 DIAGNOSIS — I5033 Acute on chronic diastolic (congestive) heart failure: Secondary | ICD-10-CM | POA: Insufficient documentation

## 2021-05-26 DIAGNOSIS — I43 Cardiomyopathy in diseases classified elsewhere: Secondary | ICD-10-CM

## 2021-05-26 DIAGNOSIS — I2510 Atherosclerotic heart disease of native coronary artery without angina pectoris: Secondary | ICD-10-CM | POA: Diagnosis not present

## 2021-05-26 DIAGNOSIS — Z79899 Other long term (current) drug therapy: Secondary | ICD-10-CM | POA: Diagnosis not present

## 2021-05-26 DIAGNOSIS — E8770 Fluid overload, unspecified: Secondary | ICD-10-CM | POA: Diagnosis not present

## 2021-05-26 DIAGNOSIS — E854 Organ-limited amyloidosis: Secondary | ICD-10-CM | POA: Diagnosis not present

## 2021-05-26 DIAGNOSIS — I130 Hypertensive heart and chronic kidney disease with heart failure and stage 1 through stage 4 chronic kidney disease, or unspecified chronic kidney disease: Secondary | ICD-10-CM | POA: Diagnosis not present

## 2021-05-26 DIAGNOSIS — I252 Old myocardial infarction: Secondary | ICD-10-CM | POA: Diagnosis not present

## 2021-05-26 DIAGNOSIS — R42 Dizziness and giddiness: Secondary | ICD-10-CM | POA: Diagnosis not present

## 2021-05-26 DIAGNOSIS — I25810 Atherosclerosis of coronary artery bypass graft(s) without angina pectoris: Secondary | ICD-10-CM

## 2021-05-26 DIAGNOSIS — I272 Pulmonary hypertension, unspecified: Secondary | ICD-10-CM | POA: Diagnosis not present

## 2021-05-26 DIAGNOSIS — E1122 Type 2 diabetes mellitus with diabetic chronic kidney disease: Secondary | ICD-10-CM | POA: Insufficient documentation

## 2021-05-26 DIAGNOSIS — I951 Orthostatic hypotension: Secondary | ICD-10-CM | POA: Insufficient documentation

## 2021-05-26 LAB — BASIC METABOLIC PANEL
Anion gap: 10 (ref 5–15)
BUN: 80 mg/dL — ABNORMAL HIGH (ref 6–20)
CO2: 21 mmol/L — ABNORMAL LOW (ref 22–32)
Calcium: 8.7 mg/dL — ABNORMAL LOW (ref 8.9–10.3)
Chloride: 109 mmol/L (ref 98–111)
Creatinine, Ser: 2.63 mg/dL — ABNORMAL HIGH (ref 0.44–1.00)
GFR, Estimated: 20 mL/min — ABNORMAL LOW (ref >60)
Glucose, Bld: 151 mg/dL — ABNORMAL HIGH (ref 70–99)
Potassium: 4.2 mmol/L (ref 3.5–5.1)
Sodium: 140 mmol/L (ref 135–145)

## 2021-05-26 LAB — BRAIN NATRIURETIC PEPTIDE: B Natriuretic Peptide: 135.6 pg/mL — ABNORMAL HIGH (ref 0.0–100.0)

## 2021-05-26 MED ORDER — TORSEMIDE 20 MG PO TABS: 20.0000 mg | ORAL_TABLET | Freq: Every day | ORAL | 4 refills | Status: DC

## 2021-05-26 MED ORDER — TECHNETIUM TC 99M PYROPHOSPHATE
21.3000 | Freq: Once | INTRAVENOUS | Status: AC | PRN
  Administered 2021-05-26: 21.3 via INTRAVENOUS

## 2021-05-26 NOTE — Patient Instructions (Addendum)
Thank you for coming in today  Labs were done today, if any labs are abnormal the clinic will call you  Your physician recommends that you schedule a follow-up appointment in: 6 weeks and 12 weeks with Dr. Aundra Dubin  TAKE Forsemide 40 mg  2 tablets daily for 3 days then back to 20 mg 1 tablet daily   At the [facility], you and your health needs are our priority. As part of our continuing mission to provide you with exceptional heart care, we have created designated Provider Care Teams. These Care Teams include your primary Cardiologist (physician) and Advanced Practice Providers (APPs- Physician Assistants and Nurse Practitioners) who all work together to provide you with the care you need, when you need it.   You may see any of the following providers on your designated Care Team at your next follow up: Dr Glori Bickers Dr Haynes Kerns, NP Lyda Jester, [place] [facility] [place], [place] Audry Riles, PharmD   Please be sure to bring in all your medications bottles to every appointment.   If you have any questions or concerns before your next appointment please send [place] a message through [place] [facility] or call our office at [phone].    TO LEAVE A MESSAGE FOR THE NURSE SELECT OPTION 2, PLEASE LEAVE A MESSAGE INCLUDING: YOUR NAME DATE OF BIRTH CALL BACK NUMBER REASON FOR CALL**this is important as we prioritize the call backs  YOU WILL RECEIVE A CALL BACK THE SAME DAY AS LONG AS YOU CALL BEFORE 4:00 PM

## 2021-05-26 NOTE — Progress Notes (Signed)
CSW consulted to help pt look into getting compression hose and abdominal binder as pt insurance would not cover it.  CSW coworker had ordered compression hose and abdominal binder to pt home but pt has not been feeling well and hadn't looked at packages yet.  Pt will see if they arrived and call pt if they don't work  Jorge Ny, [place] Worker [facility] Desk#: [phone] Cell#: [phone]

## 2021-05-26 NOTE — Progress Notes (Signed)
ReDS Vest / Clip - 05/26/21 1100       ReDS Vest / Clip   Station Marker A    Ruler Value 29.5    ReDS Value Range High volume overload    ReDS Actual Value 44

## 2021-05-27 ENCOUNTER — Telehealth (HOSPITAL_COMMUNITY): Payer: Self-pay | Admitting: Surgery

## 2021-05-27 ENCOUNTER — Ambulatory Visit (INDEPENDENT_AMBULATORY_CARE_PROVIDER_SITE_OTHER): Payer: Medicaid Other | Admitting: Cardiovascular Disease

## 2021-05-27 ENCOUNTER — Encounter (HOSPITAL_BASED_OUTPATIENT_CLINIC_OR_DEPARTMENT_OTHER): Payer: Self-pay | Admitting: Cardiovascular Disease

## 2021-05-27 VITALS — BP 163/68 | HR 61 | Ht 63.5 in | Wt 234.9 lb

## 2021-05-27 DIAGNOSIS — N184 Chronic kidney disease, stage 4 (severe): Secondary | ICD-10-CM | POA: Diagnosis not present

## 2021-05-27 DIAGNOSIS — Z5181 Encounter for therapeutic drug level monitoring: Secondary | ICD-10-CM

## 2021-05-27 DIAGNOSIS — E7800 Pure hypercholesterolemia, unspecified: Secondary | ICD-10-CM

## 2021-05-27 DIAGNOSIS — I10 Essential (primary) hypertension: Secondary | ICD-10-CM | POA: Diagnosis not present

## 2021-05-27 DIAGNOSIS — I2583 Coronary atherosclerosis due to lipid rich plaque: Secondary | ICD-10-CM

## 2021-05-27 DIAGNOSIS — I2510 Atherosclerotic heart disease of native coronary artery without angina pectoris: Secondary | ICD-10-CM | POA: Diagnosis not present

## 2021-05-27 DIAGNOSIS — G4733 Obstructive sleep apnea (adult) (pediatric): Secondary | ICD-10-CM | POA: Diagnosis not present

## 2021-05-27 DIAGNOSIS — I5033 Acute on chronic diastolic (congestive) heart failure: Secondary | ICD-10-CM

## 2021-05-27 HISTORY — DX: Pure hypercholesterolemia, unspecified: E78.00

## 2021-05-27 HISTORY — DX: Obstructive sleep apnea (adult) (pediatric): G47.33

## 2021-05-27 MED ORDER — ATORVASTATIN CALCIUM 40 MG PO TABS: 40.0000 mg | ORAL_TABLET | Freq: Every evening | ORAL | 3 refills | Status: DC

## 2021-05-27 MED ORDER — HYDRALAZINE HCL 50 MG PO TABS: 50.0000 mg | ORAL_TABLET | Freq: Three times a day (TID) | ORAL | 3 refills | Status: DC

## 2021-05-27 MED ORDER — HYDRALAZINE HCL 25 MG PO TABS: 50.0000 mg | ORAL_TABLET | Freq: Three times a day (TID) | ORAL | 3 refills | Status: DC

## 2021-05-27 NOTE — Patient Instructions (Addendum)
Medication Instructions:  INCREASE YOUR HYDRALAZINE TO 50 MG THREE TIMES A DAY AFTER YOU START WEARING YOUR BINDER AND COMPRESSION SOCKS   INCREASE YOUR ATORVASTATIN TO 40 MG DAILY   Labwork: FASTING LP/CMET IN 3 MONTHS   Testing/Procedures: NONE  Follow-Up: 07/01/2021 8:30 AM WITH PHARM D AT THE [facility]   Any Other Special Instructions Will Be Listed Below    MONITOR YOUR BLOOD PRESSURE TWICE A DAY, LOG IN THE BOOK PROVIDED. BRING THE BOOK AND YOUR BLOOD PRESSURE MACHINE TO YOUR FOLLOW UP IN 1 MONTH

## 2021-05-27 NOTE — Assessment & Plan Note (Signed)
We will increase atorvastatin to 40 mg.  Repeat lipids/CMP in 2-3 months.

## 2021-05-27 NOTE — Telephone Encounter (Signed)
-----   Message from Joette Catching, [place] sent at 05/26/2021  5:45 PM EST ----- Labs stable. Increased Torsemide today. Get BMET again in 1 week.

## 2021-05-27 NOTE — Assessment & Plan Note (Signed)
Blood pressure remains poorly controlled.  We have been limited by orthostatic hypotension.  She received an abdominal binder and compression socks in the mail but cannot locate them.  When she finds them she will start wearing them.  At that time we will increase hydralazine to 50 mg 3 times daily.  Continue amlodipine, carvedilol, clonidine, doxazosin, spironolactone, and Imdur.  Secondary causes have been unremarkable other than CKD and volume overload.

## 2021-05-27 NOTE — Assessment & Plan Note (Addendum)
No longer having chest pain.  Continue aspirin, carvedilol, and atorvastatin.  Her LDL was 128 on 11/2020.  We will increase atorvastatin to 40 mg.  Repeat lipids/CMP in 2-3 months.

## 2021-05-27 NOTE — Addendum Note (Signed)
Encounter addended by: Joette Catching, PA-C on: 05/27/2021 3:18 PM  Actions taken: Clinical Note Signed

## 2021-05-27 NOTE — Assessment & Plan Note (Signed)
Sleep study pending 12/18

## 2021-05-27 NOTE — Progress Notes (Signed)
Advanced Hypertension Clinic Follow-up:    Date:  05/27/2021   ID:  Maria Hudson, DOB 04/09/1961, MRN [id number]  PCP:  Nolene Ebbs, MD  Cardiologist:  Buford Dresser, MD  Nephrologist:  Referring MD: Nolene Ebbs, MD   CC: Hypertension  History of Present Illness:    Maria Hudson is a 60 y.o. female with a hx of chronic diastolic heart failure, diabetes mellitus, CKD 3, hypertension, and stroke, here for follow-up. She initially established care in the Advanced Hypertension Clinic 03/12/2021.  She was seen in the hospital 02/2021 with chest pain. Blood pressure on admission was 189/80. High sensitivity troponin was flat and was not consistent with acute coronary syndrome. Her EKG showed no ischemic changes. She was noted to have elevated creatinine. Echo at admission revealed LVEF 55-60% and mild LVH with no significant valvular disease. She had acute on chronic renal failure and spironolactone was discontinued. She was instructed to follow up with nephrology and with the advanced hypertension clinic.  She has a history of an MI.  Her last heart cath was 05/2013, at which time she had 20% LAD, 50 to 60% left circumflex, and 100% RPDA lesions with left-to-right collaterals.  Her work-up at [facility] included a negative PYP study.  Serum IFE was not suggestive of monoclonal gammopathy.  Urine IFE and immunoglobin free light chains were elevated (lg FLCK 9.24, Ig FLCL 3.75, K/L ratio 2.46.   She reported labile hypertension, sometimes fluctuating from the 130s to the 200s in a short time. She reported difficulty wearing her BiPAP at night. She was referred for a sleep study. She had significant orthostasis in the office. Her blood pressure was 540 systolic lying down and dropped to 120 upon standing. Thyroid function, renin, and aldosterone were normal, as were catecholamines and metanephrines. Her PTH was elevated at 134 but calcium was normal. Doxazosin was increased to 2 mg at night.   At her last appointment she had gained 18 pounds in the last 2 and half weeks, likely due to volume overload.  She had recently been started on metolazone and was significantly dyspneic.  Her blood pressure at that time was 198/75.  Given her tenuous renal function we recommended that she be admitted to the hospital for IV diuretics.  She was admitted 10/31 and discharged 11/6.  Renal artery Dopplers at hospitalization were normal.  She had a gout flare with diuresis and was started on steroids.  Her blood pressure in the hospital was in the 180s to 200s.  Her hypoalbuminemia was thought to be contributing.  She also had acute on chronic renal failure with diuresis.  In the hospital doxazosin and Imdur were increased.  Maria Hudson followed up in advance heart failure clinic transitions of care on 11/10.  She continued to struggle with NYHA class III symptoms.  She was referred to advanced heart failure clinic for right heart cath and potential CardioMEMS placement.  She was again admitted 04/29/2021 with chest pain and dizziness.  She had lost about 30 pounds with diuresis.  Her chest pain was thought to be musculoskeletal and she had acute on chronic renal failure due to overdiuresis.  Clonidine was switched to 3 times daily to avoid rebound.  She followed up in heart failure clinic and was feeling somewhat better, though she continued to struggle with supine hypertension and orthostatic hypotension.  He was concerned about autonomic neuropathy from amyloidosis.  Compression stockings and an abdominal binder were recommended.  Imdur was decreased to 30 mg  a day.  Reds vest was borderline at 34%.  She was restarted on torsemide at a lower dose.  She had a right heart cath 05/14/2021 which revealed normal filling pressures and prominent V waves.  There was no significant mitral regurgitation so was thought to be due to diastolic dysfunction.  Pulmonary pressures were mildly elevated.  Cardiac output was preserved.   Sleep apnea evaluation was recommended.  She followed up in heart failure clinic 05/26/2021.  She has been in communication with her nephrologist due to volume overload and had gained 18 pounds over 2 weeks.  Her torsemide dose was increased.  In the office her Reds vest reading was up to 44%.  Her blood pressure at that time was 158/78.  She has been unable to afford her abdominal binder or compression socks due to lack of insurance coverage.  The patient care phone and social work were contacted.  They recommended increasing torsemide to 40 mg daily.  PYP scan was repeated.  The study report is not yet available.  Lately she has been feeling very tired.  She we will get her sleep study next week.  She received AN abdominal binder and compression stocks in the mail but is unsure where they are.  She still has some lightheadedness when she stands, though not as bad as in the past.  Lately her blood pressures been in the 200s when she first gets up and then throughout the day it is typically in the 160s.  She continues to have volume overload and was instructed to increase her torsemide to 40 mg for 3 days.  She has very occasional episodes of chest pain, though not nearly as bad as when she was in the hospital.  She tries to do some exercises with a machine for her thighs and arms twice a day.  She gets very tired when she does this but has no chest pain or pressure.  She has noted some lower extremity edema lately but denies orthopnea or PND.  Previous antihypertensives: Losartan - elevated creatinine  Past Medical History:  Diagnosis Date   Acute on chronic diastolic heart failure ([place]) 03/05/2021   CHF (congestive heart failure) ([place])    CKD (chronic kidney disease) stage 4, GFR 15-29 ml/min ([place]) 03/05/2021   Diabetes mellitus without complication ([place])    Hypertension    Hypertensive urgency 04/14/2021   OSA (obstructive sleep apnea) 05/27/2021   Pure hypercholesterolemia 05/27/2021   Renal  disorder    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   Stroke [facility])     Past Surgical History:  Procedure Laterality Date   RIGHT HEART CATH N/A 05/14/2021   Procedure: RIGHT HEART CATH;  Surgeon: Larey Dresser, MD;  Location: [place] [facility];  Service: Cardiovascular;  Laterality: N/A;    Current Medications: Current Meds  Medication Sig   acetaminophen (TYLENOL) 500 MG tablet Take 1,000 mg by mouth every 6 (six) hours as needed for moderate pain or headache.   allopurinol (ZYLOPRIM) 100 MG tablet Take 100 mg by mouth daily.   amLODipine (NORVASC) 10 MG tablet Take 10 mg by mouth every morning.   aspirin EC 81 MG tablet Take 81 mg by mouth daily. Swallow whole.   buPROPion (WELLBUTRIN) 100 MG tablet Take 100 mg by mouth 2 (two) times daily.   carvedilol (COREG) 25 MG tablet Take 1 tablet (25 mg total) by mouth 2 (two) times daily with a meal.   cloNIDine (CATAPRES) 0.3 MG tablet Take 1 tablet (0.3  mg total) by mouth 3 (three) times daily.   diclofenac Sodium (VOLTAREN) 1 % GEL Apply 2 g topically 4 (four) times daily. (Patient taking differently: Apply 2 g topically 2 (two) times daily as needed (pain).)   doxazosin (CARDURA) 8 MG tablet Take 1 tablet (8 mg total) by mouth every evening.   fluticasone (FLONASE) 50 MCG/ACT nasal spray Place 1 spray into both nostrils daily as needed for allergies or rhinitis.   insulin glargine (LANTUS) 100 unit/mL SOPN Inject 10 Units into the skin at bedtime.   isosorbide mononitrate (IMDUR) 60 MG 24 hr tablet Take 1 tablet (60 mg total) by mouth daily.   loratadine (CLARITIN) 10 MG tablet Take 10 mg by mouth daily.   Multiple Vitamin (MULTIVITAMIN ADULT) TABS Take 1 tablet by mouth daily.   nitroGLYCERIN (NITROSTAT) 0.4 MG SL tablet Place 1 tablet (0.4 mg total) under the tongue every 5 (five) minutes as needed for chest pain.   NOVOLOG FLEXPEN 100 UNIT/ML FlexPen Inject 8 Units into the skin 3 (three) times daily with meals.   Omega-3 Fatty Acids  (FISH OIL) 1000 MG CAPS Take 1,000 mg by mouth daily.   pantoprazole (PROTONIX) 40 MG tablet Take 1 tablet (40 mg total) by mouth daily.   polyethylene glycol (MIRALAX / GLYCOLAX) 17 g packet Take 17 g by mouth daily.   pregabalin (LYRICA) 25 MG capsule Take 25 mg by mouth 2 (two) times daily.   sertraline (ZOLOFT) 50 MG tablet Take 50 mg by mouth daily.   spironolactone (ALDACTONE) 25 MG tablet Take 1 tablet (25 mg total) by mouth daily.   torsemide (DEMADEX) 20 MG tablet Take 1 tablet (20 mg total) by mouth daily.   traZODone (DESYREL) 50 MG tablet Take 50 mg by mouth at bedtime.   [DISCONTINUED] atorvastatin (LIPITOR) 20 MG tablet Take 20 mg by mouth every evening.   [DISCONTINUED] hydrALAZINE (APRESOLINE) 25 MG tablet Take 1 tablet (25 mg total) by mouth 3 (three) times daily.     Allergies:   Hydrocodone and Oxycodone-acetaminophen   Social History   Socioeconomic History   Marital status: Divorced    Spouse name: Not on file   Number of children: 2   Years of education: Not on file   Highest education level: Some college, no degree  Occupational History   Occupation: retired  Tobacco Use   Smoking status: Never    Passive exposure: Never   Smokeless tobacco: Never  Vaping Use   Vaping Use: Never used  Substance and Sexual Activity   Alcohol use: Not Currently   Drug use: Not Currently   Sexual activity: Not on file  Other Topics Concern   Not on file  Social History Narrative   Not on file   Social Determinants of Health   Financial Resource Strain: Low Risk    Difficulty of Paying Living Expenses: Not very hard  Food Insecurity: No Food Insecurity   Worried About Charity fundraiser in the Last Year: Never true   [place] in the Last Year: Never true  Transportation Needs: No Transportation Needs   Lack of Transportation (Medical): No   Lack of Transportation (Non-Medical): No  Physical Activity: Inactive   Days of Exercise per Week: 0 days   Minutes  of Exercise per Session: 0 min  Stress: Stress Concern Present   Feeling of Stress : Very much  Social Connections: Moderately Isolated   Frequency of Communication with Friends and Family: More than  three times a week   Frequency of Social Gatherings with Friends and Family: More than three times a week   Attends Religious Services: More than 4 times per year   Active Member of [employer] or Organizations: No   Attends Archivist Meetings: Never   Marital Status: Divorced     Family History: The patient's family history includes Hypertension in her daughter, father, mother, sister, and son; Stroke in her mother.  ROS:   Please see the history of present illness.    (+) Palpitations (+) Shortness of breath (+) Cough (+) Sharp abdominal pain (+) Bilateral LE edema All other systems reviewed and are negative.  EKGs/Labs/Other Studies Reviewed:    Echo 04/30/21:  1. Left ventricular ejection fraction, by estimation, is 55 to 60%. The  left ventricle has normal function. The left ventricle has no regional  wall motion abnormalities. There is moderate left ventricular hypertrophy.  Left ventricular diastolic  parameters are indeterminate.   2. Right ventricular systolic function is normal. The right ventricular  size is normal.   3. Left atrial size was mildly dilated.   4. The mitral valve is normal in structure. No evidence of mitral valve  regurgitation. No evidence of mitral stenosis.   5. The aortic valve was not well visualized. Aortic valve regurgitation  is not visualized. No aortic stenosis is present.   6. The inferior vena cava is normal in size with greater than 50%  respiratory variability, suggesting right atrial pressure of 3 mmHg.   7. No significant pericardial effusion.   8. Limited echo.   US Carotid 08/07/2020 (CE): 1.  Atherosclerosis in bilateral cervical carotid arteries as described  above.  2.  R ICA: evidence of no stenosis by criteria.  3.  L  ICA: evidence of no stenosis by criteria.  4.  Bilateral vertebral arteries demonstrate antegrade flow.   [place] 05/14/21: Right Heart Pressures [facility] Procedural Findings: Hemodynamics (mmHg) RA mean 1 RV 40/3 PA 41/10, eman 24 PCWP mean 12, v-waves to 26  Oxygen saturations: PA 67% AO 100%  Cardiac Output (Fick) 6.2  Cardiac Index (Fick) 3 PVR 1.9 WU    LHC 05/2013:  Ostium: normal Successful Catheter: 5 fr. JL3.5 Unsuccessful Catheters: none  LAD: 20% mid lesion   Circumflex: 50-60% in a large branch of a large Ramus Intermedius; otherwise, no significant LCx disease   RCA: 30% mid; 100% rPDA with L-->R collaterals   EKG:   04/14/2021: EKG was not ordered today. 03/12/2021: Sinus rhythm. Rate 62 bpm. LVH with repolarization abnormality. First degree AV block.  Recent Labs: 04/30/2021: ALT 8; Magnesium 2.2; TSH 3.173 05/05/2021: Platelets 213 05/14/2021: Hemoglobin 10.5; Hemoglobin 11.2 05/26/2021: B Natriuretic Peptide 135.6; BUN 80; Creatinine, Ser 2.63; Potassium 4.2; Sodium 140   Recent Lipid Panel No results found for: CHOL, TRIG, HDL, CHOLHDL, VLDL, LDLCALC, LDLDIRECT  Physical Exam:   VS:  BP (!) 163/68 (BP Location: Right Arm, Patient Position: Sitting, Cuff Size: Large)   Pulse 61   Ht 5' 3.5" (1.613 m)   Wt 234 lb 14.4 oz (106.5 kg)   SpO2 98%   BMI 40.96 kg/m  , BMI Body mass index is 40.96 kg/m. GENERAL:  Well appearing HEENT: Pupils equal round and reactive, fundi not visualized, oral mucosa unremarkable NECK:  No jugular venous distention, waveform within normal limits, carotid upstroke brisk and symmetric, no bruits, no thyromegaly LUNGS:  Clear to auscultation bilaterally HEART:  RRR.  PMI not displaced or sustained,S1 and  S2 within normal limits, no S3, no S4, no clicks, no rubs, II/VI systolic murmur at the LUSB ABD:  Flat, positive bowel sounds normal in frequency in pitch, no bruits, no rebound, no guarding, no midline pulsatile mass, no  hepatomegaly, no splenomegaly EXT:  2 plus pulses throughout, 2+ pitting edema to above the ankles bilaterally, no cyanosis no clubbing SKIN:  No rashes no nodules NEURO:  Cranial nerves II through XII grossly intact, motor grossly intact throughout PSYCH:  Cognitively intact, oriented to person place and time    ASSESSMENT/PLAN:    Acute on chronic diastolic heart failure (HCC) BNP mildly elevated.  Her torsemide has been increased to 40 mg daily for 3 days.  There is some discrepancy.  The note from heart failure clinic says to increase it to 40 daily which she thought was.  He only 3 days again.  We will send them a message to clarify.  CAD (coronary artery disease) No longer having chest pain.  Continue aspirin, carvedilol, and atorvastatin.  Her LDL was 128 on 11/2020.  We will increase atorvastatin to 40 mg.  Repeat lipids/CMP in 2-3 months.   Resistant hypertension Blood pressure remains poorly controlled.  We have been limited by orthostatic hypotension.  She received an abdominal binder and compression socks in the mail but cannot locate them.  When she finds them she will start wearing them.  At that time we will increase hydralazine to 50 mg 3 times daily.  Continue amlodipine, carvedilol, clonidine, doxazosin, spironolactone, and Imdur.  Secondary causes have been unremarkable other than CKD and volume overload.  OSA (obstructive sleep apnea) Sleep study pending 12/18  Pure hypercholesterolemia We will increase atorvastatin to 40 mg.  Repeat lipids/CMP in 2-3 months.   CKD (chronic kidney disease) stage 4, GFR 15-29 ml/min (HCC) Renal function was actually little better when last checked.  She remains at high balance between volume overload and acute on chronic renal failure with intravascular volume depletion.  She is on torsemide for at least 3 days.  We will clarify as above.  She has follow-up with nephrology next week.   Screening for Secondary Hypertension:  Causes  03/12/2021  Drugs/Herbals Screened     - Comments minimal caffeine, limits sodium  Renovascular HTN Not Screened  Sleep Apnea Screened     - Comments Has a BiPAP.  Needs adjusting.  Thyroid Disease Screened     - Comments check TSH  Hyperaldosteronism Screened  Pheochromocytoma Screened  Cushing's Syndrome Screened    Relevant Labs/Studies: Basic Labs Latest Ref Rng & Units 05/26/2021 05/16/2021 05/14/2021  Sodium 135 - 145 mmol/L 140 138 140  Potassium 3.5 - 5.1 mmol/L 4.2 4.4 4.4  Creatinine 0.44 - 1.00 mg/dL 2.63(H) 3.46(H) -    Thyroid  Latest Ref Rng & Units 04/30/2021 03/13/2021  TSH 0.350 - 4.500 uIU/mL 3.173 3.280    Renin/Aldosterone  Latest Ref Rng & Units 03/13/2021  Aldosterone 0.0 - 30.0 ng/dL 6.1  Renin 0.167 - 5.380 ng/mL/hr 0.991  Aldos/Renin Ratio 0.0 - 30.0 6.2    Metanephrines/Catecholamines  Latest Ref Rng & Units 03/13/2021  Epinephrine 0 - 62 pg/mL <15  Norepinephrine 0 - 874 pg/mL 129  Dopamine 0 - 48 pg/mL <30  Metanephrines 0.0 - 88.0 pg/mL <10.0  Normetanephrines  0.0 - 244.0 pg/mL 15.6       Renovascular  04/14/2021  Renal Artery [place] Completed Yes      Disposition:    F/u 2 months.   Medication  Adjustments/Labs and Tests Ordered: Current medicines are reviewed at length with the patient today.  Concerns regarding medicines are outlined above.   Orders Placed This Encounter  Procedures   Lipid panel   Comprehensive metabolic panel     Meds ordered this encounter  Medications   atorvastatin (LIPITOR) 40 MG tablet    Sig: Take 1 tablet (40 mg total) by mouth every evening.    Dispense:  90 tablet    Refill:  3    NEW DOSE, D/C 20 MG RX   DISCONTD: hydrALAZINE (APRESOLINE) 25 MG tablet    Sig: Take 2 tablets (50 mg total) by mouth 3 (three) times daily.    Dispense:  270 tablet    Refill:  3    NEW DOSE, D/C 25 MG RX   hydrALAZINE (APRESOLINE) 50 MG tablet    Sig: Take 1 tablet (50 mg total) by mouth 3 (three) times daily.     Dispense:  270 tablet    Refill:  3    NEW DOSE, D/C 25 MG RX    Signed, Skeet Latch, MD  05/27/2021 11:50 AM    [place]

## 2021-05-27 NOTE — Assessment & Plan Note (Signed)
BNP mildly elevated.  Her torsemide has been increased to 40 mg daily for 3 days.  There is some discrepancy.  The note from heart failure clinic says to increase it to 40 daily which she thought was.  He only 3 days again.  We will send them a message to clarify.

## 2021-05-27 NOTE — Assessment & Plan Note (Signed)
Renal function was actually little better when last checked.  She remains at high balance between volume overload and acute on chronic renal failure with intravascular volume depletion.  She is on torsemide for at least 3 days.  We will clarify as above.  She has follow-up with nephrology next week.

## 2021-05-27 NOTE — Telephone Encounter (Signed)
I attempted to reach patient regarding lab work.  I left a message for a return call.

## 2021-05-28 ENCOUNTER — Telehealth (HOSPITAL_COMMUNITY): Payer: Self-pay

## 2021-05-28 DIAGNOSIS — I5032 Chronic diastolic (congestive) heart failure: Secondary | ICD-10-CM

## 2021-05-28 NOTE — Telephone Encounter (Signed)
-----   Message from Joette Catching, [place] sent at 05/26/2021  5:45 PM EST ----- Labs stable. Increased Torsemide today. Get BMET again in 1 week.

## 2021-05-28 NOTE — Telephone Encounter (Signed)
Patient advised and verbalized understanding. Lab appt scheduled,lab order entered.   Orders Placed This Encounter  Procedures   Basic metabolic panel    Standing Status:   Future    Standing Expiration Date:   05/28/2022    Order Specific Question:   Release to patient    Answer:   Immediate

## 2021-06-01 ENCOUNTER — Other Ambulatory Visit: Payer: Self-pay

## 2021-06-01 ENCOUNTER — Ambulatory Visit (HOSPITAL_BASED_OUTPATIENT_CLINIC_OR_DEPARTMENT_OTHER): Payer: Medicaid Other | Attending: Cardiovascular Disease | Admitting: Cardiovascular Disease

## 2021-06-01 DIAGNOSIS — I10 Essential (primary) hypertension: Secondary | ICD-10-CM | POA: Diagnosis not present

## 2021-06-01 DIAGNOSIS — G4733 Obstructive sleep apnea (adult) (pediatric): Secondary | ICD-10-CM | POA: Insufficient documentation

## 2021-06-01 DIAGNOSIS — E669 Obesity, unspecified: Secondary | ICD-10-CM | POA: Insufficient documentation

## 2021-06-01 DIAGNOSIS — R0683 Snoring: Secondary | ICD-10-CM | POA: Insufficient documentation

## 2021-06-01 DIAGNOSIS — G4730 Sleep apnea, unspecified: Secondary | ICD-10-CM

## 2021-06-02 DIAGNOSIS — E1165 Type 2 diabetes mellitus with hyperglycemia: Secondary | ICD-10-CM | POA: Diagnosis not present

## 2021-06-02 DIAGNOSIS — I509 Heart failure, unspecified: Secondary | ICD-10-CM | POA: Diagnosis not present

## 2021-06-02 DIAGNOSIS — E1122 Type 2 diabetes mellitus with diabetic chronic kidney disease: Secondary | ICD-10-CM | POA: Diagnosis not present

## 2021-06-02 DIAGNOSIS — K30 Functional dyspepsia: Secondary | ICD-10-CM | POA: Diagnosis not present

## 2021-06-02 DIAGNOSIS — I10 Essential (primary) hypertension: Secondary | ICD-10-CM | POA: Diagnosis not present

## 2021-06-03 ENCOUNTER — Telehealth (HOSPITAL_COMMUNITY): Payer: Self-pay | Admitting: Physician Assistant

## 2021-06-03 ENCOUNTER — Other Ambulatory Visit: Payer: Self-pay | Admitting: *Deleted

## 2021-06-03 ENCOUNTER — Other Ambulatory Visit: Payer: Self-pay

## 2021-06-03 NOTE — Telephone Encounter (Addendum)
Some recent confusion about dosing of Torsemide at outpatient f/u visits. At visit with me on 12/12 we increased torsemide to 40 mg X 3 days then was to go back to 20 mg daily.   At pharmacy visit today dose is listed as 40 mg daily.   Called patient and left voicemail for her to call back and confirm dose she is taking.  Has lab scheduled for tomorrow.

## 2021-06-03 NOTE — Patient Instructions (Signed)
Visit Information  Maria Hudson was given information about Medicaid Managed Care team care coordination services as a part of their [facility] Medicaid benefit. Maria Hudson verbally consented to engagement with the [facility] Managed Care team.   If you are experiencing a medical emergency, please call 911 or report to your local emergency department or urgent care.   If you have a non-emergency medical problem during routine business hours, please contact your provider's office and ask to speak with a nurse.   For questions related to your [facility] health plan, please call: [phone] or go here:[email or web address]  If you would like to schedule transportation through your [facility] plan, please call the following number at least 2 days in advance of your appointment: [phone].  Call the [facility] at [phone], at any time, 24 hours a day, 7 days a week. If you are in danger or need immediate medical attention call 911.  If you would like help to quit smoking, call 1-800-QUIT-NOW 704-665-2858) OR Espaol: 1-855-Djelo-Ya (3-710-626-9485) o para ms informacin haga clic aqu or Text READY to 200-400 to register via text  Maria Hudson - following are the goals we discussed in your visit today:   Goals Addressed   ake medications as prescribed   Attend all scheduled provider appointments Call pharmacy for medication refills 3-7 days in advance of running out of medications Attend church or other social activities Perform all self care activities independently  Call provider office for new concerns or questions  Work with the social worker to address care coordination needs and will continue to work with the clinical team to address health care and disease management related needs call office if I gain more than 2 pounds in one day or 5 pounds in one week keep legs up while sitting track weight in diary use salt in moderation watch for  swelling in feet, ankles and legs every day weigh myself daily know when to call the doctorfor fluid weight gain or consistently abnormal BP readings at home track symptoms and what helps feel better or worse check blood sugar at prescribed times: three times daily enter blood sugar readings and medication or insulin into daily log take the blood sugar log to all doctor visits take the blood sugar meter to all doctor visits eat fish at least once per week fill half of plate with vegetables manage portion size check blood pressure daily write blood pressure results in a log or diary learn about high blood pressure take blood pressure log to all doctor appointments call doctor for signs and symptoms of high blood pressure keep all doctor appointments take medications for blood pressure exactly as prescribed Limit fluid intake to 1 liter per day Call health plan customer service to discuss benefits related to wellness, Curves Complete, behavioral health, and over the counter health care items Collaborate with managed Medicaid team of LCSW, BSW and pharmacist to address identified needs      The patient verbalized understanding of instructions provided today and declined a print copy of patient instruction materials.   The Managed Medicaid care management team will reach out to the patient again over the next 30 days.   Maria Churn RN, [name], [place] Management Coordinator - Managed [place] High Risk [phone]   Following is a copy of your plan of care:  Care Plan : [place]  Updates made by [name], RN since 06/03/2021 12:00  AM     Problem: Knowledge Deficit and Care Coordination Needs Related to Management of HTN, HF, Type 2 DM   Priority: High     Long-Range Goal: Development of Plan of Care to Address Knowledge Deficits and Care Coordination Needs related to  management of CHF, CAD, HTN, HLD, DMII, CKD  Stage 4, and Depression   Start Date: 05/06/2021  Expected End Date: 09/08/2021  Priority: High  Note:   Current Barriers:  Knowledge Deficits related to plan of care for management of CHF, CAD, HTN, HLD, DMII, CKD Stage 4, and Depression: depressed mood loss of energy/fatigue insomnia disturbed sleep decreased appetite difficulty concentrating recurrent thoughts of death  Care Coordination needs related to Housing barriers and Mental Health Concerns    RNCM Clinical Goal(s):  Patient will demonstrate ongoing adherence to prescribed treatment plan for CHF, CAD, HTN, HLD, DMII, CKD Stage 4, and Depression as evidenced by patient report of improved health and quality of life and no unplanned ED or unplanned hospital admissions continue to work with RN Care Manager and/or Social Worker to address care management and care coordination needs related to CHF, CAD, HTN, HLD, DMII, CKD Stage 4, and Depression as evidenced by adherence to CM Team Scheduled appointments     work with pharmacist to address medication review and discuss ? medication side effect of chronic constipation and medication blister packs related to CHF, CAD, HTN, HLD, DMII, CKD Stage 4, and Depression as evidenced by review of EMR and patient or pharmacist report    through collaboration with RN Care manager, provider, and care team.  work with managed Medicaid LCSW to address mental health concerns related to the management of depression work with managed Medicaid BSW to address needs related to housing barriers as evidenced by patient receiving resources from BSW for low income housing options in [place]   Interventions: Inter-disciplinary care team collaboration (see longitudinal plan of care) Evaluation of current treatment plan related to  self management and patient's adherence to plan as established by provider Assessed status of low income housing resources e-mail to patient by BSW- will message BSW and ask her to send  them to patient's e-mail address again; verified patient's e-mail address Ensured patient received health information on DM sick day management, HTN,  HF Action Plan , medication box and [facility] CM calendar mailed to her last month- provided opportunity for patient to ask questions   Heart Failure Interventions:  (Status: New goal.)  Long Term Goal - patient states she sometimes forgets to weigh in the morning because she is busy taking her son to work or her grandchildren to school , says she cannot tolerate the compression stockings because they are too tight and make her legs hurt Provided patient with opportunity to ask question about Heart Failure Action Plan that was mailed to her last month Encouraged patient to weigh everyday and record on log sheet in [facility] CM calendar even if she forgets to weigh in the morning but to include note by the weight that it was not a morning weight  Encouraged patient to discuss with her cardiologist getting another pair of compression stockings with less compression  Diabetes:  (Status: Goal on Track (progressing): YES.) Long Term Goal - patient  completed telehealth visit with endocrinologist on 12/19 but she did not tell him she was no longer taking Victoza as it was stopped when she was discharged from the hospital on 11/6 for unknown reason, states she will start wearing th Dexcom  once she has completed all tests and procedures, she continues to monitor her CBG 3 times daily and reports most readings are meeting treatment targets  Lab Results  Component Value Date   HGBA1C 6.7 (H) 04/29/2021  Assessed patient's understanding of A1C goal: <7% Reviewed medications with patient and discussed importance of medication adherence;        Counseled on importance of regular laboratory monitoring as prescribed;        Discussed plans with patient for ongoing care management follow up and provided patient with direct contact information for care management team;       Advised patient, providing education and rationale, to check cbg three times daily and record        call provider for findings outside established parameters;       Review of patient status, including review of consultants reports, relevant laboratory and other test results, and medications completed;       Encouraged patient to tell endocrinologist she is not taking Victoza per hospital d/c instructions of 04/20/21  Hypertension: (Status: Goal on track: NO.) Long Term Goal- patient blood pressure continues to be very difficult to control due to orthostatic  hypotension episodes Last practice recorded BP readings:  BP Readings from Last 3 Encounters:  05/27/21 (!) 163/68  05/26/21 (!) 158/78  05/14/21 (!) 157/68    Most recent eGFR/CrCl:  Lab Results  Component Value Date   EGFR 19 (L) 03/13/2021    No components found for: CRCL  Evaluation of current treatment plan related to hypertension self management and patient's adherence to plan as established by provider;   Reviewed medications with patient and discussed importance of compliance;  Discussed plans with patient for ongoing care management follow up and provided patient with direct contact information for care management team; Advised patient, providing education and rationale, to monitor blood pressure daily and record, calling PCP for findings outside established parameters;  Reviewed scheduled/upcoming provider appointments including: with cardiologist on 07/01/21  Interdisciplinary Collaboration:  (Status: Goal on Track (progressing): YES.) Long Term Goal  Collaborated with BSW to initiate plan of care to address needs related to Housing barriers in patient with CHF, CAD, HTN, HLD, DM, CKD Stage 4, and Depression- will message BSW that patient accidentally deleted the resources that were emailed to her and ask BSW to resend them  Collaboration with LCSW to address mental health needs related to treatment of  depression Collaboration with pharmacist for medication review, discussion of ? Medication side effects causing chronic constipation and arranging for patient to received home delivery of medications in blister packs Collaboration with Nolene Ebbs, MD regarding development and update of comprehensive plan of care as evidenced by provider attestation and co-signature Inter-disciplinary care team collaboration (see longitudinal plan of care)  Patient Goals/Self-Care Activities: Take medications as prescribed   Attend all scheduled provider appointments Call pharmacy for medication refills 3-7 days in advance of running out of medications Attend church or other social activities Perform all self care activities independently  Call provider office for new concerns or questions  Work with the social worker to address care coordination needs and will continue to work with the clinical team to address health care and disease management related needs call office if I gain more than 2 pounds in one day or 5 pounds in one week keep legs up while sitting track weight in diary use salt in moderation watch for swelling in feet, ankles and legs every day weigh myself daily know when  to call the doctorfor fluid weight gain or consistently abnormal BP readings at home track symptoms and what helps feel better or worse check blood sugar at prescribed times: three times daily enter blood sugar readings and medication or insulin into daily log take the blood sugar log to all doctor visits take the blood sugar meter to all doctor visits eat fish at least once per week fill half of plate with vegetables manage portion size check blood pressure daily write blood pressure results in a log or diary learn about high blood pressure take blood pressure log to all doctor appointments call doctor for signs and symptoms of high blood pressure keep all doctor appointments take medications for blood pressure  exactly as prescribed Limit fluid intake to 1 liter per day Call health plan customer service to discuss benefits related to wellness, Curves Complete, behavioral health, and over the counter health care items Collaborate with managed Medicaid team of LCSW, BSW and pharmacist to address identified needs

## 2021-06-03 NOTE — Patient Outreach (Signed)
Medicaid Managed Care   Nurse Care Manager Note  06/03/2021 Name:  Maria Hudson MRN:  [id number] DOB:  [birth year]  Maria Hudson is an 60 y.o. year old female who is a primary patient of Nolene Ebbs, MD.  The Medicaid Managed Care Coordination team was consulted for assistance with:    CHF HTN DMI CKD Stage 4 Depression Obesity  Ms. Schmid was given information about Medicaid Managed Care Coordination team services today. [name] Patient agreed to services and verbal consent obtained.  [name] for follow up visit in response to provider referral for case management and/or care coordination services.   Assessments/Interventions:  Review of past medical history, allergies, medications, health status, including review of consultants reports, laboratory and other test data, was performed as part of comprehensive evaluation and provision of chronic care management services.    Care Plan  Allergies  Allergen Reactions   Hydrocodone Itching    Patient able to tolerate when taken with Benadryl.   Oxycodone-Acetaminophen Itching    Patient able to tolerate when taken with Benadryl.    Medications Reviewed Today     Reviewed by [name], RN (Registered Nurse) on 06/03/21 at 1422  Med List Status: <None>   Medication Order Taking? Sig Documenting Provider Last Dose Status Informant  acetaminophen (TYLENOL) 500 MG tablet [id number] No Take 1,000 mg by mouth every 6 (six) hours as needed for moderate pain or headache. [provider] Taking Active Self  allopurinol (ZYLOPRIM) 100 MG tablet [id number] No Take 100 mg by mouth daily. [provider] Taking Active Self  amLODipine (NORVASC) 10 MG tablet [id number] No Take 10 mg by mouth every morning. [provider] Taking Active Self  aspirin EC 81 MG tablet [id number] No Take 81 mg by mouth daily. Swallow whole. [provider] Taking Active Self           Med  Note [name]   Mon Apr 14, 2021  9:22 PM)    atorvastatin (LIPITOR) 40 MG tablet [id number] No Take 1 tablet (40 mg total) by mouth every evening. Skeet Latch, MD Taking Active   buPROPion [facility]) 100 MG tablet [id number] No Take 100 mg by mouth 2 (two) times daily. [provider] Taking Active Self  carvedilol (COREG) 25 MG tablet [id number] No Take 1 tablet (25 mg total) by mouth 2 (two) times daily with a meal. [name], [name], MD Taking Active Self  chlorthalidone (HYGROTON) 50 MG tablet [id number] No Take 50 mg by mouth daily. [provider] Taking Active   cloNIDine (CATAPRES) 0.3 MG tablet [id number] No Take 1 tablet (0.3 mg total) by mouth 3 (three) times daily. [name], DO Taking Active Self  diclofenac Sodium (VOLTAREN) 1 % GEL [id number] No Apply 2 g topically 4 (four) times daily.  Patient taking differently: Apply 2 g topically 2 (two) times daily as needed (pain).   [name], DO Taking Active Self           Med Note [name], [name] May 06, 2021 12:59 PM) Use to treat left sided chest pain- non cardiac pain  dicyclomine (BENTYL) 10 MG capsule [id number] No Take 10 mg by mouth daily.  Patient not taking: Reported on 06/03/2021   [provider] Not Taking Active   doxazosin (CARDURA) 8 MG tablet [id number] No Take 1 tablet (8 mg total) by mouth every evening. [name], DO Taking Active Self  fluticasone Asencion Islam)  50 MCG/ACT nasal spray [id number] No Place 1 spray into both nostrils daily as needed for allergies or rhinitis. [provider] Taking Active Self  hydrALAZINE (APRESOLINE) 50 MG tablet [id number] No Take 1 tablet (50 mg total) by mouth 3 (three) times daily. Skeet Latch, MD Taking Active   insulin glargine (LANTUS) 100 unit/mL SOPN [id number] No Inject 10 Units into the skin at bedtime. [provider] Taking Active Self           Med Note [name], MELISSA R   Tue May 06, 2021   3:37 PM)    isosorbide mononitrate (IMDUR) 60 MG 24 hr tablet [id number] No Take 1 tablet (60 mg total) by mouth daily. Larey Dresser, MD Taking Active Self  loratadine (CLARITIN) 10 MG tablet [id number] No Take 10 mg by mouth daily. [provider] Taking Active Self  Multiple Vitamin (MULTIVITAMIN ADULT) TABS [id number] No Take 1 tablet by mouth daily. [provider] Taking Active Self  nitroGLYCERIN (NITROSTAT) 0.4 MG SL tablet [id number] No Place 1 tablet (0.4 mg total) under the tongue every 5 (five) minutes as needed for chest pain.  Patient not taking: Reported on 06/03/2021   Tawni Millers, MD Not Taking Active Self           Med Note Duffy Bruce, [name]   Tue Apr 29, 2021  9:30 PM)    NOVOLOG FLEXPEN 100 UNIT/ML FlexPen [id number] No Inject 8 Units into the skin 3 (three) times daily with meals. [provider] Taking Active Self           Med Note [name], MELISSA R   Tue May 06, 2021  5:01 PM)    Omega-3 Fatty Acids (FISH OIL) 1000 MG CAPS [id number] No Take 1,000 mg by mouth daily. [provider] Taking Active Self  pantoprazole (PROTONIX) 40 MG tablet [id number] No Take 1 tablet (40 mg total) by mouth daily. [name], DO Taking Active Self  polyethylene glycol (MIRALAX / GLYCOLAX) 17 g packet [id number] No Take 17 g by mouth daily. [name], [name], MD Taking Active Self  pregabalin (LYRICA) 25 MG capsule [id number] No Take 25 mg by mouth 2 (two) times daily. [provider] Taking Active Self  sertraline (ZOLOFT) 50 MG tablet [id number] No Take 50 mg by mouth daily. [provider] Taking Active Self  spironolactone (ALDACTONE) 25 MG tablet [id number] No Take 1 tablet (25 mg total) by mouth daily. [name], DO Taking Active Self  torsemide (DEMADEX) 20 MG tablet [id number] No Take 1 tablet (20 mg total) by mouth daily. Joette Catching, PA-C Taking Active   traZODone (DESYREL) 50 MG tablet [id number] No  Take 50 mg by mouth at bedtime. [provider] Taking Active Self            Patient Active Problem List   Diagnosis Date Noted   OSA (obstructive sleep apnea) 05/27/2021   Pure hypercholesterolemia 05/27/2021   Chronic diastolic CHF (congestive heart failure) ([place]) 04/29/2021   Hypertensive urgency 04/14/2021   Acute on chronic diastolic CHF (congestive heart failure) ([place]) 04/14/2021   Chest pain 03/05/2021   CAD (coronary artery disease) 03/05/2021   T2DM (type 2 diabetes mellitus) ([place]) 03/05/2021   CKD (chronic kidney disease) stage 4, GFR 15-29 ml/min ([place]) 03/05/2021   Class 3 obesity ([place]) 03/05/2021   CVA (cerebral vascular accident) ([place]) 03/05/2021   Acute on chronic diastolic heart failure ([place]) 03/05/2021   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   AKI (  acute kidney injury) ([place]) 03/05/2021    Care Plan : RN Care Manager Plan Of Care  Updates made by [name], RN since 06/03/2021 12:00 AM     Problem: Knowledge Deficit and Care Coordination Needs Related to Management of HTN, HF, Type 2 DM   Priority: High     Long-Range Goal: Development of Plan of Care to Address Knowledge Deficits and Care Coordination Needs related to  management of CHF, CAD, HTN, HLD, DMII, CKD Stage 4, and Depression   Start Date: 05/06/2021  Expected End Date: 09/08/2021  Priority: High  Note:   Current Barriers:  Knowledge Deficits related to plan of care for management of CHF, CAD, HTN, HLD, DMII, CKD Stage 4, and Depression: depressed mood loss of energy/fatigue insomnia disturbed sleep decreased appetite difficulty concentrating recurrent thoughts of death  Care Coordination needs related to Housing barriers and Mental Health Concerns    RNCM Clinical Goal(s):  Patient will demonstrate ongoing adherence to prescribed treatment plan for CHF, CAD, HTN, HLD, DMII, CKD Stage 4, and Depression as evidenced by patient report of improved health and quality of life and no  unplanned ED or unplanned hospital admissions continue to work with RN Care Manager and/or Social Worker to address care management and care coordination needs related to CHF, CAD, HTN, HLD, DMII, CKD Stage 4, and Depression as evidenced by adherence to CM Team Scheduled appointments     work with pharmacist to address medication review and discuss ? medication side effect of chronic constipation and medication blister packs related to CHF, CAD, HTN, HLD, DMII, CKD Stage 4, and Depression as evidenced by review of EMR and patient or pharmacist report    through collaboration with RN Care manager, provider, and care team.  work with managed Medicaid LCSW to address mental health concerns related to the management of depression work with managed Medicaid BSW to address needs related to housing barriers as evidenced by patient receiving resources from BSW for low income housing options in [place]   Interventions: Inter-disciplinary care team collaboration (see longitudinal plan of care) Evaluation of current treatment plan related to  self management and patient's adherence to plan as established by provider Assessed status of low income housing resources e-mail to patient by BSW- will message BSW and ask her to send them to patient's e-mail address again; verified patient's email address Ensured patient received health information on DM sick day management, HTN,  HF Action Plan , medication box and [facility] CM calendar mailed to her last month- provided opportunity for patient to ask questions   Heart Failure Interventions:  (Status: New goal.)  Long Term Goal - patient states she sometimes forgets to weigh in the morning because she is busy taking her son to work or her grandchildren to school , says she cannot tolerate the compression stockings because they are too tight and make her legs hurt Provided patient with opportunity to ask question about Heart Failure Action Plan that was mailed to her last  month Encouraged patient to weigh everyday and record on log sheet in [facility] CM calendar even if she forgets to weigh in the morning but to include note by the weight that it was not a morning weight  Encouraged patient to discuss with her cardiologist getting another pair of compression stockings with less compression Provided patient with opportunity to ask question about Heart Failure Action Plan that was mailed to her last month Encouraged patient to weigh everyday and record on log sheet  in [place] calendar even if she forgets to weigh in the morning but to include note by the weight that it was not a morning weight   Diabetes:  (Status: Goal on Track (progressing): YES.) Long Term Goal -patient  completed telehealth visit with endocrinologist on 12/19 but she did not tell him she was no longer taking Victoza as it was stopped when she was discharged from the hospital on 11/6 for unknown reason, states she will start wearing th Dexcom once she has completed all tests and procedures, she continues to monitor her CBG 3 times daily and reports most readings are meeting treatment targets  Lab Results  Component Value Date   HGBA1C 6.7 (H) 04/29/2021  Assessed patient's understanding of A1C goal: <7% Reviewed medications with patient and discussed importance of medication adherence;        Counseled on importance of regular laboratory monitoring as prescribed;        Discussed plans with patient for ongoing care management follow up and provided patient with direct contact information for care management team;      Advised patient, providing education and rationale, to check cbg three times daily and record        call provider for findings outside established parameters;       Review of patient status, including review of consultants reports, relevant laboratory and other test results, and medications completed;        Hypertension: (Status: Goal on track: NO.) Long Term Goal- patient blood pressure  continues to be very difficult to control due to orthostatic  hypotension episodes Last practice recorded BP readings:  BP Readings from Last 3 Encounters:  05/27/21 (!) 163/68  05/26/21 (!) 158/78  05/14/21 (!) 157/68    Most recent eGFR/CrCl:  Lab Results  Component Value Date   EGFR 19 (L) 03/13/2021    No components found for: CRCL  Evaluation of current treatment plan related to hypertension self management and patient's adherence to plan as established by provider;   Reviewed medications with patient and discussed importance of compliance;  Discussed plans with patient for ongoing care management follow up and provided patient with direct contact information for care management team; Advised patient, providing education and rationale, to monitor blood pressure daily and record, calling PCP for findings outside established parameters;  Reviewed scheduled/upcoming provider appointments including: with cardiologist on 07/01/21  Interdisciplinary Collaboration:  (Status: Goal on Track Progressing - Yes) Long Term Goal  Collaborated with BSW to initiate plan of care to address needs related to Housing barriers in patient with CHF, CAD, HTN, HLD, DM, CKD Stage 4, and Depression- will message BSW that patient accidentally deleted the resources that were emailed to her and ask BSW to resend them  Collaboration with LCSW to address mental health needs related to treatment of depression Collaboration with pharmacist for medication review, discussion of ? Medication side effects causing chronic constipation and arranging for patient to received home delivery of medications in blister packs Collaboration with Nolene Ebbs, MD regarding development and update of comprehensive plan of care as evidenced by provider attestation and co-signature Inter-disciplinary care team collaboration (see longitudinal plan of care)  Patient Goals/Self-Care Activities: Take medications as prescribed   Attend all  scheduled provider appointments Call pharmacy for medication refills 3-7 days in advance of running out of medications Attend church or other social activities Perform all self care activities independently  Call provider office for new concerns or questions  Work with the social worker  to address care coordination needs and will continue to work with the clinical team to address health care and disease management related needs call office if I gain more than 2 pounds in one day or 5 pounds in one week keep legs up while sitting track weight in diary use salt in moderation watch for swelling in feet, ankles and legs every day weigh myself daily know when to call the doctorfor fluid weight gain or consistently abnormal BP readings at home track symptoms and what helps feel better or worse check blood sugar at prescribed times: three times daily enter blood sugar readings and medication or insulin into daily log take the blood sugar log to all doctor visits take the blood sugar meter to all doctor visits eat fish at least once per week fill half of plate with vegetables manage portion size check blood pressure daily write blood pressure results in a log or diary learn about high blood pressure take blood pressure log to all doctor appointments call doctor for signs and symptoms of high blood pressure keep all doctor appointments take medications for blood pressure exactly as prescribed Limit fluid intake to 1 liter per day Call health plan customer service to discuss benefits related to wellness, [employer], behavioral health, and over the counter health care items Collaborate with managed Medicaid team of LCSW, BSW and pharmacist to address identified needs       Follow Up:  Patient agrees to Care Plan and Follow-up.  Plan: The Managed Medicaid care management team will reach out to the patient again over the next 30 days.  Date/time of next scheduled RN care  management/care coordination outreach:  07/01/21 at 2:30 pm  [name], [name], [place] Management Coordinator - Managed [place] High Risk [phone]

## 2021-06-03 NOTE — Patient Outreach (Signed)
Medicaid Managed Care Pharmacy Note  06/03/2021 Name:  Maria Hudson MRN:  [id number] DOB:  1960/08/14  Maria Hudson is an 60 y.o. year old female who is a primary patient of Maria Ebbs, Hudson.  The Maria Hudson Managed Care Coordination team was consulted for assistance with disease management and care coordination needs.    [name] for initial visit in response to referral for case management and/or care coordination services.  Maria Hudson was given information about Medicaid Managed Care Coordination team services today. Maria Hudson Patient agreed to services and verbal consent obtained.  Objective:  Lab Results  Component Value Date   CREATININE 2.63 (H) 05/26/2021   CREATININE 3.46 (H) 05/16/2021   CREATININE 3.12 (H) 05/05/2021    Lab Results  Component Value Date   HGBA1C 6.7 (H) 04/29/2021    BP Readings from Last 3 Encounters:  05/27/21 (!) 163/68  05/26/21 (!) 158/78  05/14/21 (!) 157/68    Assessment/Interventions: Review of patient past medical history, allergies, medications, health status, including review of consultants reports, laboratory and other test data, was performed as part of comprehensive evaluation and provision of chronic care management services.   SDOH:  (Social Determinants of Health) assessments and interventions performed:  SDOH Interventions    Flowsheet Row Most Recent Value  SDOH Interventions   Depression Interventions/Treatment  Currently on Treatment       Care Plan  Allergies  Allergen Reactions   Hydrocodone Itching    Patient able to tolerate when taken with Benadryl.   Oxycodone-Acetaminophen Itching    Patient able to tolerate when taken with Benadryl.    Medications Reviewed Today     Reviewed by Maria Hudson (Pharmacist) on 06/03/21 at 1223  Med List Status: <None>   Medication Order Taking? Sig Documenting Provider Last Dose Status Informant  acetaminophen (TYLENOL) 500 MG  tablet [id number] Yes Take 1,000 mg by mouth every 6 (six) hours as needed for moderate pain or headache. Provider, Historical, Hudson Taking Active Self  allopurinol (ZYLOPRIM) 100 MG tablet [id number] Yes Take 100 mg by mouth daily. Provider, Historical, Hudson Taking Active Self  amLODipine (NORVASC) 10 MG tablet [id number] Yes Take 10 mg by mouth every morning. Provider, Historical, Hudson Taking Active Self  aspirin EC 81 MG tablet [id number] Yes Take 81 mg by mouth daily. Swallow whole. Provider, Historical, Hudson Taking Active Self           Med Note Maria Hudson   Mon Apr 14, 2021  9:22 PM)    atorvastatin (LIPITOR) 40 MG tablet [id number] Yes Take 1 tablet (40 mg total) by mouth every evening. Maria Hudson Taking Active   buPROPion [facility]) 100 MG tablet [id number] Yes Take 100 mg by mouth 2 (two) times daily. Provider, Historical, Hudson Taking Active Self  carvedilol (COREG) 25 MG tablet [id number] Yes Take 1 tablet (25 mg total) by mouth 2 (two) times daily with a meal. Maria Hudson Taking Active Self  chlorthalidone (HYGROTON) 50 MG tablet [id number] Yes Take 50 mg by mouth daily. Provider, Historical, Hudson Taking Active   cloNIDine (CATAPRES) 0.3 MG tablet [id number] Yes Take 1 tablet (0.3 mg total) by mouth 3 (three) times daily. Maria Hudson Taking Active Self  diclofenac Sodium (VOLTAREN) 1 % GEL [id number] Yes Apply 2 g topically 4 (four) times daily.  Patient taking differently: Apply 2 g topically 2 (two) times daily as needed (pain).   Maria Hudson Taking Active Self  Med Note Maria Hudson, Maria Hudson   Tue May 06, 2021 12:59 PM) Use to treat left sided chest pain- non cardiac pain  dicyclomine (BENTYL) 10 MG capsule [id number] No Take 10 mg by mouth daily.  Patient not taking: Reported on 06/03/2021   Provider, Historical, Hudson Not Taking Active   doxazosin (CARDURA) 8 MG tablet [id number] Yes Take 1 tablet (8 mg total) by mouth every evening. Maria Hudson  Taking Active Self  fluticasone (FLONASE) 50 MCG/ACT nasal spray [id number] Yes Place 1 spray into both nostrils daily as needed for allergies or rhinitis. Provider, Historical, Hudson Taking Active Self  hydrALAZINE (APRESOLINE) 50 MG tablet [id number] Yes Take 1 tablet (50 mg total) by mouth 3 (three) times daily. Maria Hudson Taking Active   insulin glargine (LANTUS) 100 unit/mL SOPN [id number] Yes Inject 10 Units into the skin at bedtime. Provider, Historical, Hudson Taking Active Self           Med Note Maria Hudson   Tue May 06, 2021  3:37 PM)    isosorbide mononitrate (IMDUR) 60 MG 24 hr tablet [id number] Yes Take 1 tablet (60 mg total) by mouth daily. Maria Hudson Taking Active Self  loratadine (CLARITIN) 10 MG tablet [id number] Yes Take 10 mg by mouth daily. Provider, Historical, Hudson Taking Active Self  Multiple Vitamin (MULTIVITAMIN ADULT) TABS [id number] Yes Take 1 tablet by mouth daily. Provider, Historical, Hudson Taking Active Self  nitroGLYCERIN (NITROSTAT) 0.4 MG SL tablet [id number] No Place 1 tablet (0.4 mg total) under the tongue every 5 (five) minutes as needed for chest pain.  Patient not taking: Reported on 06/03/2021   Maria Hudson Not Taking Active Self           Med Note Maria Hudson   Tue Apr 29, 2021  9:30 PM)    NOVOLOG FLEXPEN 100 UNIT/ML FlexPen [id number] Yes Inject 8 Units into the skin 3 (three) times daily with meals. Provider, Historical, Hudson Taking Active Self           Med Note Maria Hudson   Tue May 06, 2021  5:01 PM)    Omega-3 Fatty Acids (FISH OIL) 1000 MG CAPS [id number] Yes Take 1,000 mg by mouth daily. Provider, Historical, Hudson Taking Active Self  pantoprazole (PROTONIX) 40 MG tablet [id number] Yes Take 1 tablet (40 mg total) by mouth daily. Maria Hudson Taking Active Self  polyethylene glycol (MIRALAX / GLYCOLAX) 17 g packet [id number] Yes Take 17 g by mouth daily. Maria Hudson Taking Active Self  pregabalin  (LYRICA) 25 MG capsule [id number] Yes Take 25 mg by mouth 2 (two) times daily. Provider, Historical, Hudson Taking Active Self  sertraline (ZOLOFT) 50 MG tablet [id number] Yes Take 50 mg by mouth daily. Provider, Historical, Hudson Taking Active Self  spironolactone (ALDACTONE) 25 MG tablet [id number] Yes Take 1 tablet (25 mg total) by mouth daily. Maria Hudson Taking Active Self  torsemide (DEMADEX) 20 MG tablet [id number] Yes Take 1 tablet (20 mg total) by mouth daily. Joette Catching, PA-C Taking Active   traZODone (DESYREL) 50 MG tablet [id number] Yes Take 50 mg by mouth at bedtime. Provider, Historical, Hudson Taking Active Self            Patient Active Problem List   Diagnosis Date Noted   OSA (obstructive sleep apnea) 05/27/2021   Pure hypercholesterolemia 05/27/2021   Chronic diastolic CHF (congestive heart failure) ([place]) 04/29/2021   Hypertensive  urgency 04/14/2021   Acute on chronic diastolic CHF (congestive heart failure) ([place]) 04/14/2021   Chest pain 03/05/2021   CAD (coronary artery disease) 03/05/2021   T2DM (type 2 diabetes mellitus) ([place]) 03/05/2021   CKD (chronic kidney disease) stage 4, GFR 15-29 ml/min (HCC) 03/05/2021   Class 3 obesity ([place]) 03/05/2021   CVA (cerebral vascular accident) ([place]) 03/05/2021   Acute on chronic diastolic heart failure ([place]) 03/05/2021   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   AKI (acute kidney injury) ([place]) 03/05/2021    Conditions to be addressed/monitored per PCP order:  CHF, HTN, HLD, DMII, CKD Stage 4, and Depression  Care Plan : General Pharmacy (Adult)  Updates made by Maria Hudson since 06/03/2021 12:00 AM     Problem: Chronic Disease Management   Priority: High  Onset Date: 06/03/2021     Long-Range Goal: Managing Chronic Disease Management   Start Date: 06/03/2021  Expected End Date: 09/01/2021  This Visit's Progress: On track  Priority: High  Note:   Current Barriers:  Unable to achieve control of  depression, edema, and hypertension  Unable to maintain control of T2DM  Pharmacist Clinical Goal(s):  patient will maintain control of T2DM as evidenced by A1C and home blood glucose readings  achieve improvement in depression, edema, and hypertension as evidenced by PHQ9 and vital signs  through collaboration with PharmD and provider.   Interventions: Inter-disciplinary care team collaboration (see longitudinal plan of care) Comprehensive medication review performed; medication list updated in electronic medical record  Diabetes:  Uncontrolled; current treatment: Lantus 16 units once daily, Novolog 8 units three times daily with meals but has sliding scale  Current glucose readings: fasting glucose: 130-150, post prandial glucose: 170-190  Denies hypoglycemic/hyperglycemic symptoms  Current meal patterns:  Breakfast: omlette + 1/2 avocado and slice cheese Evening: [place] burger Drinks: water w/ or w/o zero sugar powder, decaf coffee w/ splenda  Recommended patient continue to check blood glucose readings 2-3x/day       At next follow-up appointment can discuss CGM and alternative therapy including GLP1  Hypertension:  Uncontrolled; current treatment:amlodipine 10mg , carvedilol 25mg , clonidine 0.3mg  three times daily, hydralazine 50 units three times daily, isorbide  mononitrate 60mg , spironolactone 25mg , chlorthalidone 50mg   Current home readings: 148/70 yesterday at PCP office but typically 200/80's   Denies hypotensive/hypertensive symptoms  Recommended patient continue to follow-up with cardiology office and check blood pressure readings at home  Edema       Uncontrolled; current treatment: torsemide 40mg        Patient reports she has about 20 lbs of extra fluid weight on her right now. She states her cardiologist is aware and they have been working with her to  monitor this       Recommended patient continue to follow-up with cardiology and continue to weigh herself  daily  Depression/Anxiety:  Uncontrolled; current treatment: bupropion 100mg  twice daily, sertraline 50mg  daily;   PHQ9: 19  Patient currently not set up with mental health therapist as she was under the impression the Managed Medicaid Social Worker team patient was speaking t to was her therapist. Discussed with patient that is not their role and their role is rather to help her establish with a therapist per their previous discussions.   Recommended patient follow-up with social worker tomorrow to discuss resources again if patient does not have them  Constipation       Uncontrolled; Miralax       Patient reports that without Miralax she was previously not having  a bowel movement for 3 weeks and now with Miralax it is about once a week. She states  sometimes her bowel movements are hard but yesterday at her provider appointment she went to sit down and felt something. Upon going to the bathroom to check she realized she has soft stool on her pad. She states she did not feel this bowel movement come out at all.       PCP put in referral to GI yesterday and recommended patient continue to follow-up with PCP if she does not hear back regarding GI referral in next few  weeks  Patient Goals/Self-Care Activities patient will:  - check glucose 2-3x/day, document, and provide at future appointments - check blood pressure daily, document, and provide at future appointments - weigh daily - [facility]packaging [facility]  Follow Up Plan:  Telephone follow up appointment with care management team member scheduled for: 06/03/21 Care Manager; 06/04/21 Social Worker; 07/04/21 Pharmacist Next cardiology appointment: 07/07/21        Maria Hudson PharmD, CPP High Risk Managed [facility] Health [phone]

## 2021-06-03 NOTE — Patient Instructions (Signed)
Visit Information  Maria Hudson was given information about Medicaid Managed Care team care coordination services as a part of their [facility] Medicaid benefit. Maria Hudson verbally consented to engagement with the [facility] Managed Care team.   If you are experiencing a medical emergency, please call 911 or report to your local emergency department or urgent care.   If you have a non-emergency medical problem during routine business hours, please contact your provider's office and ask to speak with a nurse.   For questions related to your [facility] health plan, please call: [phone] or go here:[email or web address]  If you would like to schedule transportation through your [facility] plan, please call the following number at least 2 days in advance of your appointment: [phone].  Call the [place] at [phone], at any time, 24 hours a day, 7 days a week. If you are in danger or need immediate medical attention call 911.  If you would like help to quit smoking, call 1-800-QUIT-NOW (905) 603-1921) OR Espaol: 1-855-Djelo-Ya (6-967-893-8101) o para ms informacin haga clic aqu or Text READY to 200-400 to register via text  Maria Hudson - following are the goals we discussed in your visit today:   Goals Addressed   None     The patient verbalized understanding of instructions provided today and declined a print copy of patient instruction materials.   The [facility] will [facility] as advised to set up pill packaging.  RN Care Manager will call on 06/03/21 Social Worker will 06/04/21.  Pharmacist will 07/04/21 Cardiology appointment: 07/07/21  Hughes Better PharmD, CPP High Risk Managed Medicaid [place] [phone]   Following is a copy of your plan of care:  Care Plan : General Pharmacy (Adult)  Updates made by Hughes Better, RPH-CPP since 06/03/2021 12:00 AM     Problem: Chronic Disease Management    Priority: High  Onset Date: 06/03/2021     Long-Range Goal: Managing Chronic Disease Management   Start Date: 06/03/2021  Expected End Date: 09/01/2021  This Visit's Progress: On track  Priority: High  Note:   Current Barriers:  Unable to achieve control of depression, edema, and hypertension  Unable to maintain control of T2DM  Pharmacist Clinical Goal(s):  patient will maintain control of T2DM as evidenced by A1C and home blood glucose readings  achieve improvement in depression, edema, and hypertension as evidenced by PHQ9 and vital signs  through collaboration with PharmD and provider.   Interventions: Inter-disciplinary care team collaboration (see longitudinal plan of care) Comprehensive medication review performed; medication list updated in electronic medical record  Diabetes:  Uncontrolled; current treatment: Lantus 16 units once daily, Novolog 8 units three times daily with meals but has sliding scale  Current glucose readings: fasting glucose: 130-150, post prandial glucose: 170-190  Denies hypoglycemic/hyperglycemic symptoms  Current meal patterns:  Breakfast: omlette + 1/2 avocado and slice cheese Evening: [place] burger Drinks: water w/ or w/o zero sugar powder, decaf coffee w/ splenda  Recommended patient continue to check blood glucose readings 2-3x/day       At next follow-up appointment can discuss CGM and alternative therapy including GLP1  Hypertension:  Uncontrolled; current treatment:amlodipine 10mg , carvedilol 25mg , clonidine 0.3mg  three times daily, hydralazine 50 units three times daily, isorbide  mononitrate 60mg , spironolactone 25mg , chlorthalidone 50mg   Current home readings: 148/70 yesterday at PCP office but typically 200/80's   Denies hypotensive/hypertensive symptoms  Recommended patient continue to follow-up with cardiology office and check blood pressure readings at home  Edema       Uncontrolled; current treatment: torsemide 40mg         Patient reports she has about 20 lbs of extra fluid weight on her right now. She states her cardiologist is aware and they have been working with her to  monitor this       Recommended patient continue to follow-up with cardiology and continue to weigh herself daily  Depression/Anxiety:  Uncontrolled; current treatment: bupropion 100mg  twice daily, sertraline 50mg  daily;   PHQ9: 19  Patient currently not set up with mental health therapist as she was under the impression the Managed Medicaid Social Worker team patient was speaking t to was her therapist. Discussed with patient that is not their role and their role is rather to help her establish with a therapist per their previous discussions.   Recommended patient follow-up with social worker tomorrow to discuss resources again if patient does not have them  Constipation       Uncontrolled; Miralax       Patient reports that without Miralax she was previously not having a bowel movement for 3 weeks and now with Miralax it is about once a week. She states  sometimes her bowel movements are hard but yesterday at her provider appointment she went to sit down and felt something. Upon going to the bathroom to check she realized she has soft stool on her pad. She states she did not feel this bowel movement come out at all.       PCP put in referral to GI yesterday and recommended patient continue to follow-up with PCP if she does not hear back regarding GI referral in next few  weeks  Patient Goals/Self-Care Activities patient will:  - check glucose 2-3x/day, document, and provide at future appointments - check blood pressure daily, document, and provide at future appointments - weigh daily - [facility] to [facility] up pill-packaging services  Follow Up Plan:  Telephone follow up appointment with care management team member scheduled for: 06/03/21 Care Manager; 06/04/21 Social Worker; 07/04/21 Pharmacist Next cardiology appointment:  07/07/21

## 2021-06-04 ENCOUNTER — Other Ambulatory Visit: Payer: Self-pay

## 2021-06-04 ENCOUNTER — Other Ambulatory Visit (HOSPITAL_COMMUNITY): Payer: Medicaid Other

## 2021-06-05 ENCOUNTER — Other Ambulatory Visit: Payer: Self-pay

## 2021-06-05 ENCOUNTER — Ambulatory Visit (HOSPITAL_COMMUNITY)
Admission: RE | Admit: 2021-06-05 | Discharge: 2021-06-05 | Disposition: A | Discharge status: Home / Self Care | Payer: Medicaid Other | Source: Ambulatory Visit | Attending: Internal Medicine | Admitting: Internal Medicine

## 2021-06-05 DIAGNOSIS — N1832 Chronic kidney disease, stage 3b: Secondary | ICD-10-CM | POA: Diagnosis not present

## 2021-06-05 DIAGNOSIS — H3582 Retinal ischemia: Secondary | ICD-10-CM | POA: Diagnosis not present

## 2021-06-05 DIAGNOSIS — E113593 Type 2 diabetes mellitus with proliferative diabetic retinopathy without macular edema, bilateral: Secondary | ICD-10-CM | POA: Diagnosis not present

## 2021-06-05 DIAGNOSIS — D631 Anemia in chronic kidney disease: Secondary | ICD-10-CM | POA: Diagnosis not present

## 2021-06-05 DIAGNOSIS — I5030 Unspecified diastolic (congestive) heart failure: Secondary | ICD-10-CM | POA: Diagnosis not present

## 2021-06-05 DIAGNOSIS — F32A Depression, unspecified: Secondary | ICD-10-CM | POA: Diagnosis not present

## 2021-06-05 DIAGNOSIS — I639 Cerebral infarction, unspecified: Secondary | ICD-10-CM | POA: Diagnosis not present

## 2021-06-05 DIAGNOSIS — H35033 Hypertensive retinopathy, bilateral: Secondary | ICD-10-CM | POA: Diagnosis not present

## 2021-06-05 DIAGNOSIS — E871 Hypo-osmolality and hyponatremia: Secondary | ICD-10-CM | POA: Diagnosis not present

## 2021-06-05 DIAGNOSIS — I5032 Chronic diastolic (congestive) heart failure: Secondary | ICD-10-CM | POA: Diagnosis not present

## 2021-06-05 DIAGNOSIS — I129 Hypertensive chronic kidney disease with stage 1 through stage 4 chronic kidney disease, or unspecified chronic kidney disease: Secondary | ICD-10-CM | POA: Diagnosis not present

## 2021-06-05 DIAGNOSIS — I2510 Atherosclerotic heart disease of native coronary artery without angina pectoris: Secondary | ICD-10-CM | POA: Diagnosis not present

## 2021-06-05 DIAGNOSIS — H35371 Puckering of macula, right eye: Secondary | ICD-10-CM | POA: Diagnosis not present

## 2021-06-05 LAB — BASIC METABOLIC PANEL
Anion gap: 9 (ref 5–15)
BUN: 78 mg/dL — ABNORMAL HIGH (ref 6–20)
CO2: 21 mmol/L — ABNORMAL LOW (ref 22–32)
Calcium: 9.2 mg/dL (ref 8.9–10.3)
Chloride: 109 mmol/L (ref 98–111)
Creatinine, Ser: 2.71 mg/dL — ABNORMAL HIGH (ref 0.44–1.00)
GFR, Estimated: 20 mL/min — ABNORMAL LOW (ref >60)
Glucose, Bld: 160 mg/dL — ABNORMAL HIGH (ref 70–99)
Potassium: 3.9 mmol/L (ref 3.5–5.1)
Sodium: 139 mmol/L (ref 135–145)

## 2021-06-05 NOTE — Telephone Encounter (Signed)
Pt reports  she did increased to 40 mg daily foe 3 days and then 20 mg thereafter. Was told by nephrology to increase to 40 mg daily on 05/2121

## 2021-06-05 NOTE — Patient Outreach (Signed)
[place] [facility]) Care Management  06/05/2021  Lizzie An Sep 30, 1960 [id number]  LCSW completed [facility] outreach attempt today during scheduled appointment time but was unable to reach patient successfully. A HIPPA compliant voice message was left encouraging patient to return call once available. LCSW will ask Scheduling Care Guide to reschedule [facility] SW appointment with patient as well.  Eula Fried, BSW, MSW, [employer] Managed Medicaid LCSW [email or web address] Phone: [phone]

## 2021-06-05 NOTE — Patient Instructions (Signed)
Maria Hudson ,   The [facility] Managed Care Team is available to provide assistance to you with your healthcare needs at no cost and as a benefit of your [facility] Health plan. I'm sorry I was unable to reach you today for our scheduled appointment. Our care guide will call you to reschedule our telephone appointment. Please call me at the number below. I am available to be of assistance to you regarding your healthcare needs. .   Thank you,   Eula Fried, BSW, MSW, LCSW Managed Medicaid [email or web address] Phone: [phone]

## 2021-06-11 ENCOUNTER — Other Ambulatory Visit: Payer: Self-pay

## 2021-06-11 ENCOUNTER — Telehealth: Payer: Self-pay | Admitting: Cardiovascular Disease

## 2021-06-11 DIAGNOSIS — G4733 Obstructive sleep apnea (adult) (pediatric): Secondary | ICD-10-CM

## 2021-06-11 NOTE — Telephone Encounter (Addendum)
New sleep study orders placed for patient to be able to complete after last was cancelled due to sickness. Pt. Called and informed

## 2021-06-11 NOTE — Patient Instructions (Signed)
[name] ,   The [facility] Managed Care Team is available to provide assistance to you with your healthcare needs at no cost and as a benefit of your [facility] Health plan. I'm sorry I was unable to reach you today for our scheduled appointment. Our care guide will call you to reschedule our telephone appointment. Please call me at the number below. I am available to be of assistance to you regarding your healthcare needs. .   Thank you,   Eula Fried, BSW, MSW, LCSW Managed Medicaid [email or web address] Phone: [phone]

## 2021-06-11 NOTE — Patient Outreach (Signed)
[place] [facility]) Care Management  06/11/2021  Maria Hudson 1961-02-24 [id number]  LCSW completed [facility] outreach attempt today during scheduled appointment time but was unable to reach patient successfully as the patient's number could not be completed as dialed. LCSW will ask Scheduling Care Guide to reschedule [facility] SW appointment with patient.  Eula Fried, BSW, MSW, [employer] Managed Medicaid [email or web address] Phone: [phone]

## 2021-06-11 NOTE — Telephone Encounter (Signed)
Hey there, this patient needed a new order for a sleep study. Her last was cancelled due to being sick and could not be completed. Could we please assist in getting this scheduled for her

## 2021-06-11 NOTE — Telephone Encounter (Signed)
Patient called to reschedule her sleep study. She went in for it 12/18, but had to stop the study, because she was sick. The sleep lab now requires a new order to schedule another appointment.

## 2021-06-14 ENCOUNTER — Encounter (HOSPITAL_BASED_OUTPATIENT_CLINIC_OR_DEPARTMENT_OTHER): Payer: Self-pay | Admitting: Cardiovascular Disease

## 2021-06-14 NOTE — Procedures (Signed)
° ° ° ° °  Patient Name: Maria Hudson, Maria Hudson Date: 06/01/2021 Gender: Female D.O.B: [birth year] Age (years): 60 Referring Provider: Skeet Latch Height (inches): 63 Interpreting Physician: Shelva Majestic MD, ABSM Weight (lbs): 237 RPSGT: Zadie Rhine BMI: 42 MRN: [id number] Neck Size: 15.00  CLINICAL INFORMATION Sleep Study Type: NPSG  Indication for sleep study: Obesity, OSA, Snoring  Epworth Sleepiness Score: 17  SLEEP STUDY TECHNIQUE As per the AASM Manual for the Scoring of Sleep and Associated Events v2.3 (April 2016) with a hypopnea requiring 4% desaturations.  The channels recorded and monitored were frontal, central and occipital EEG, electrooculogram (EOG), submentalis EMG (chin), nasal and oral airflow, thoracic and abdominal wall motion, anterior tibialis EMG, snore microphone, electrocardiogram, and pulse oximetry.  MEDICATIONS Medications self-administered by patient taken the night of the study : TRAZODONE, TYLENOL, LIPITOR, clondine, CARDURA, IMDUR  SLEEP ARCHITECTURE The study was initiated at 10:14:53 PM and ended at 1:47:29 AM.  Sleep onset time was 27.0 minutes and the sleep efficiency was 55.5%%. The total sleep time was 118 minutes.  Stage REM latency was 130.5 minutes.  The patient spent 9.7%% of the night in stage N1 sleep, 62.3%% in stage N2 sleep, 2.1%% in stage N3 and 25.9% in REM.  Alpha intrusion was absent.  Supine sleep was 1.27%.  RESPIRATORY PARAMETERS The overall apnea/hypopnea index (AHI) was 17.3 per hour.  The respiratory disturbance index (RDI was 19.8/h. There were 1 total apneas, including 1 obstructive, 0 central and 0 mixed apneas. There were 33 hypopneas and 5 RERAs.  The AHI during Stage REM sleep was 57.0 per hour.  AHI while supine was 40.0 per hour.  The mean oxygen saturation was 89.9%. The minimum SpO2 during sleep was 77.0%.  Moderate snoring was noted during this study.  CARDIAC DATA The 2 lead EKG  demonstrated sinus rhythm. The mean heart rate was 66.4 beats per minute. Other EKG findings include: None.  LEG MOVEMENT DATA The total PLMS were 0 with a resulting PLMS index of 0.0. Associated arousal with leg movement index was 0.0 .  IMPRESSIONS - Moderate obstructive sleep apnea occurred during this study (AHI 17.3/h; RDI 19.8/h).Events were severe with supine sleep (AHI 40.0/h) and during REM sleep (AHI 57.0/h). - Significant oxygen desaturation to a nadir of 77.0%. - The patient snored with moderate snoring volume. - No cardiac abnormalities were noted during this study. - Clinically significant periodic limb movements did not occur during sleep. No significant associated arousals.  DIAGNOSIS - Obstructive Sleep Apnea (G47.33) - Nocturnal Hypoxemia (G47.36)  RECOMMENDATIONS - In this patient with significant cardiovascular co-morbidities recommend an in-lab therapeutic CPAP titration to determine optimal pressure required to alleviate sleep disordered breathing. If unable to have an in-lab titration then initiate Auto-PAP with EPR of 3 at 7 - 18 cm of water. - Effort should be made to optimize nasal and oropharyngeal patency. - Positional therapy avoiding supine position during sleep. - Avoid alcohol, sedatives and other CNS depressants that may worsen sleep apnea and disrupt normal sleep architecture. - Sleep hygiene should be reviewed to assess factors that may improve sleep quality. - Weight management (BMI 41) and regular exercise should be initiated or continued if appropriate.  [Electronically signed] 06/14/2021 03:24 PM  Shelva Majestic MD, [facility], [place], American Board of Sleep Medicine   NPI: [id number] [place] PH: [phone]   FX: [phone] [place]

## 2021-06-15 DIAGNOSIS — Z419 Encounter for procedure for purposes other than remedying health state, unspecified: Secondary | ICD-10-CM | POA: Diagnosis not present

## 2021-06-16 DIAGNOSIS — G4730 Sleep apnea, unspecified: Secondary | ICD-10-CM | POA: Diagnosis not present

## 2021-06-16 DIAGNOSIS — I509 Heart failure, unspecified: Secondary | ICD-10-CM | POA: Diagnosis not present

## 2021-06-16 DIAGNOSIS — I10 Essential (primary) hypertension: Secondary | ICD-10-CM | POA: Diagnosis not present

## 2021-06-16 DIAGNOSIS — K219 Gastro-esophageal reflux disease without esophagitis: Secondary | ICD-10-CM | POA: Diagnosis not present

## 2021-06-16 DIAGNOSIS — E1122 Type 2 diabetes mellitus with diabetic chronic kidney disease: Secondary | ICD-10-CM | POA: Diagnosis not present

## 2021-06-17 ENCOUNTER — Ambulatory Visit: Payer: Self-pay

## 2021-06-17 ENCOUNTER — Other Ambulatory Visit: Payer: Self-pay

## 2021-06-17 ENCOUNTER — Other Ambulatory Visit: Payer: Self-pay | Admitting: Licensed Clinical Social Worker

## 2021-06-17 ENCOUNTER — Encounter: Payer: Self-pay | Admitting: Gastroenterology

## 2021-06-17 NOTE — Patient Instructions (Signed)
Visit Information  Maria Hudson was given information about Medicaid Managed Care team care coordination services as a part of their [facility] Medicaid benefit. Maria Hudson verbally consented to engagement with the [facility] Managed Care team.   If you are experiencing a medical emergency, please call 911 or report to your local emergency department or urgent care.   If you have a non-emergency medical problem during routine business hours, please contact your provider's office and ask to speak with a nurse.   For questions related to your [facility] health plan, please call: [phone] or go here:[email or web address]  If you would like to schedule transportation through your [facility] plan, please call the following number at least 2 days in advance of your appointment: [phone].  Call the [facility] at [phone], at any time, 24 hours a day, 7 days a week. If you are in danger or need immediate medical attention call 911.  If you would like help to quit smoking, call 1-800-QUIT-NOW (509) 759-7026) OR Espaol: 1-855-Djelo-Ya (3-818-299-3716) o para ms informacin haga clic aqu or Text READY to 200-400 to register via text  Maria Hudson - following are the goals we discussed in your visit today:   Goals Addressed   None       Social Worker will follow up with patient in 30 days.   Maria Hudson, BSW, [place] Managed Medicaid Team  [phone]   Following is a copy of your plan of care:  There are no care plans that you recently modified to display for this patient.

## 2021-06-17 NOTE — Patient Outreach (Signed)
Medicaid Managed Care Social Work Note  06/17/2021 Name:  Maria Hudson MRN:  [id number] DOB:  26-Feb-1961  Maria Hudson is an 61 y.o. year old female who is a primary patient of Nolene Ebbs, MD.  The Medicaid Managed Care Coordination team was consulted for assistance with:  [place] and Resources  Ms. Durkin was given information about Medicaid Managed Care Coordination team services today. [name] Patient agreed to services and verbal consent obtained.  Engaged with patient  for by telephone forfollow up visit in response to referral for case management and/or care coordination services.   Assessments/Interventions:  Review of past medical history, allergies, medications, health status, including review of consultants reports, laboratory and other test data, was performed as part of comprehensive evaluation and provision of chronic care management services.  SDOH: (Social Determinant of Health) assessments and interventions performed: BSW completed F/U call with patient, she stated she was doing okay, she did have an issue of low blood pressure, but did go and see her doctor. Patient stated she deleted all of her emails and would like for the email with the resources to be resent to her. BSW informed patient that the LCSW was out on leave at this time. BSW informed patient that [place] did reach out to her. BSW provided patient with the phone number to give them a call back to schedule an appointment. No other resources needed at this time.   Advanced Directives Status:  Not addressed in this encounter.  Care Plan                 Allergies  Allergen Reactions   Hydrocodone Itching    Patient able to tolerate when taken with Benadryl.   Oxycodone-Acetaminophen Itching    Patient able to tolerate when taken with Benadryl.    Medications Reviewed Today     Reviewed by Troy Sine, MD (Physician) on 06/14/21 at [place] List Status:  <None>   Medication Order Taking? Sig Documenting Provider Last Dose Status Informant  acetaminophen (TYLENOL) 500 MG tablet [id number] No Take 1,000 mg by mouth every 6 (six) hours as needed for moderate pain or headache. [provider] Taking Active Self  allopurinol (ZYLOPRIM) 100 MG tablet [id number] No Take 100 mg by mouth daily. [provider] Taking Active Self  amLODipine (NORVASC) 10 MG tablet [id number] No Take 10 mg by mouth every morning. [provider] Taking Active Self  aspirin EC 81 MG tablet [id number] No Take 81 mg by mouth daily. Swallow whole. [provider] Taking Active Self           Med Note [name]   Mon Apr 14, 2021  9:22 PM)    atorvastatin (LIPITOR) 40 MG tablet [id number] No Take 1 tablet (40 mg total) by mouth every evening. Skeet Latch, MD Taking Active   buPROPion [facility]) 100 MG tablet [id number] No Take 100 mg by mouth 2 (two) times daily. [provider] Taking Active Self  carvedilol (COREG) 25 MG tablet [id number] No Take 1 tablet (25 mg total) by mouth 2 (two) times daily with a meal. [name], [name], MD Taking Active Self  chlorthalidone (HYGROTON) 50 MG tablet [id number] No Take 50 mg by mouth daily. [provider] Taking Active   cloNIDine (CATAPRES) 0.3 MG tablet [id number] No Take 1 tablet (0.3 mg total) by mouth 3 (three) times daily. [name], DO Taking Active Self  diclofenac Sodium (VOLTAREN) 1 % GEL  [id number] No Apply 2 g topically 4 (four) times daily.  Patient taking differently: Apply 2 g topically 2 (two) times daily as needed (pain).   [name], DO Taking Active Self           Med Note [name], [name] May 06, 2021 12:59 PM) Use to treat left sided chest pain- non cardiac pain  dicyclomine (BENTYL) 10 MG capsule [id number] No Take 10 mg by mouth daily.  Patient not taking: Reported on 06/03/2021   [provider] Not Taking Active    doxazosin (CARDURA) 8 MG tablet [id number] No Take 1 tablet (8 mg total) by mouth every evening. [name], DO Taking Active Self  fluticasone (FLONASE) 50 MCG/ACT nasal spray [id number] No Place 1 spray into both nostrils daily as needed for allergies or rhinitis. [provider] Taking Active Self  hydrALAZINE (APRESOLINE) 50 MG tablet [id number] No Take 1 tablet (50 mg total) by mouth 3 (three) times daily. Skeet Latch, MD Taking Active   insulin glargine (LANTUS) 100 unit/mL SOPN [id number] No Inject 10 Units into the skin at bedtime. [provider] Taking Active Self           Med Note [name], MELISSA R   Tue May 06, 2021  3:37 PM)    isosorbide mononitrate (IMDUR) 60 MG 24 hr tablet [id number] No Take 1 tablet (60 mg total) by mouth daily. Larey Dresser, MD Taking Active Self  loratadine (CLARITIN) 10 MG tablet [id number] No Take 10 mg by mouth daily. [provider] Taking Active Self  Multiple Vitamin (MULTIVITAMIN ADULT) TABS [id number] No Take 1 tablet by mouth daily. [provider] Taking Active Self  nitroGLYCERIN (NITROSTAT) 0.4 MG SL tablet [id number] No Place 1 tablet (0.4 mg total) under the tongue every 5 (five) minutes as needed for chest pain.  Patient not taking: Reported on 06/03/2021   Tawni Millers, MD Not Taking Active Self           Med Note Duffy Bruce, [name]   Tue Apr 29, 2021  9:30 PM)    NOVOLOG FLEXPEN 100 UNIT/ML FlexPen [id number] No Inject 8 Units into the skin 3 (three) times daily with meals. [provider] Taking Active Self           Med Note [name], MELISSA R   Tue May 06, 2021  5:01 PM)    Omega-3 Fatty Acids (FISH OIL) 1000 MG CAPS [id number] No Take 1,000 mg by mouth daily. [provider] Taking Active Self  pantoprazole (PROTONIX) 40 MG tablet [id number] No Take 1 tablet (40 mg total) by mouth daily. [name], DO Taking Active Self  polyethylene glycol (MIRALAX / GLYCOLAX) 17  g packet [id number] No Take 17 g by mouth daily. [name], [name], MD Taking Active Self  pregabalin (LYRICA) 25 MG capsule [id number] No Take 25 mg by mouth 2 (two) times daily. [provider] Taking Active Self  sertraline (ZOLOFT) 50 MG tablet [id number] No Take 50 mg by mouth daily. [provider] Taking Active Self  spironolactone (ALDACTONE) 25 MG tablet [id number] No Take 1 tablet (25 mg total) by mouth daily. [name], DO Taking Active Self  torsemide (DEMADEX) 20 MG tablet [id number] No Take 1 tablet (20 mg total) by mouth daily. Joette Catching, PA-C Taking Active   traZODone (DESYREL) 50 MG tablet [id number] No Take 50 mg by mouth at bedtime. [provider] Taking Active Self  Patient Active Problem List   Diagnosis Date Noted   OSA (obstructive sleep apnea) 05/27/2021   Pure hypercholesterolemia 05/27/2021   Chronic diastolic CHF (congestive heart failure) ([place]) 04/29/2021   Hypertensive urgency 04/14/2021   Acute on chronic diastolic CHF (congestive heart failure) ([place]) 04/14/2021   Chest pain 03/05/2021   CAD (coronary artery disease) 03/05/2021   T2DM (type 2 diabetes mellitus) ([place]) 03/05/2021   CKD (chronic kidney disease) stage 4, GFR 15-29 ml/min ([place]) 03/05/2021   Class 3 obesity ([place]) 03/05/2021   CVA (cerebral vascular accident) ([place]) 03/05/2021   Acute on chronic diastolic heart failure ([place]) 03/05/2021   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   AKI (acute kidney injury) ([place]) 03/05/2021    Conditions to be addressed/monitored per PCP order:   mental health and housing  There are no care plans that you recently modified to display for this patient.   Follow up:  Patient agrees to Care Plan and Follow-up.  Plan: The Managed Medicaid care management team will reach out to the patient again over the next 30 days.  Date/time of next scheduled Social Work care management/care coordination outreach:   07/18/20  Mickel Fuchs, Arita Miss, [place] Medicaid Team  [phone]

## 2021-06-18 ENCOUNTER — Other Ambulatory Visit: Payer: Self-pay | Admitting: Licensed Clinical Social Worker

## 2021-06-18 NOTE — Patient Instructions (Signed)
   Patient was not contacted during this encounter.  LCSW collaborated with care team to accomplish patient's care plan goal   Eyana Stolze, LCSW Care Management & Coordination  [phone]  

## 2021-06-18 NOTE — Patient Outreach (Signed)
°  Care Management  Collaboration  Note  06/18/2021 Name: Maria Hudson MRN: [id number] DOB: 12/05/1960  LATE Entry: Maria Hudson is a 61 y.o. year old female who is a primary care patient of Maria Ebbs, MD. The [name] team was consulted reference care coordination needs for Mental Health Counseling and Resources.  Assessment: Patient was not interviewed or contacted during this encounter.   Referral previously made to [facility] for [facility] health needs.  They have made an attempt to contact patient but not able to reach her.  Intervention:Conducted brief assessment, recommendations and relevant information discussed.  [name] LCSW collaborated with [name] BSW  to assist with meeting patient's needs.  [name] BSW provided patient with an update on the referral and phone number to contact [facility] to schedule appointment.   Follow up Plan:  MM team will continue to follow patient for ongoing needs.  Will consult with [name] team members as needed.   Review of patient past medical history, allergies, medications, and health status, including review of pertinent consultant reports was performed as part of comprehensive evaluation and provision of care management/care coordination services.   Maurine Cane, LCSW  Covering for [name] MM LCSW

## 2021-06-23 DIAGNOSIS — I509 Heart failure, unspecified: Secondary | ICD-10-CM | POA: Diagnosis not present

## 2021-06-23 DIAGNOSIS — E1122 Type 2 diabetes mellitus with diabetic chronic kidney disease: Secondary | ICD-10-CM | POA: Diagnosis not present

## 2021-06-23 DIAGNOSIS — I10 Essential (primary) hypertension: Secondary | ICD-10-CM | POA: Diagnosis not present

## 2021-06-26 ENCOUNTER — Telehealth: Payer: Self-pay | Admitting: *Deleted

## 2021-06-26 ENCOUNTER — Other Ambulatory Visit: Payer: Self-pay | Admitting: Cardiovascular Disease

## 2021-06-26 DIAGNOSIS — G4733 Obstructive sleep apnea (adult) (pediatric): Secondary | ICD-10-CM

## 2021-06-26 NOTE — Telephone Encounter (Signed)
Patient returned a call to me and was given her sleep study results and recommendations. She agrees to proceed with CPAP titration sleep study.

## 2021-06-26 NOTE — Telephone Encounter (Signed)
-----   Message from Troy Sine, MD sent at 06/14/2021  3:30 PM EST ----- Mariann Laster, please notify pt and set up for CAP titration or if denied Auto-PAP

## 2021-06-26 NOTE — Telephone Encounter (Signed)
Message left to return a call to discuss her sleep study results and recommendations.

## 2021-07-01 ENCOUNTER — Ambulatory Visit (INDEPENDENT_AMBULATORY_CARE_PROVIDER_SITE_OTHER): Payer: Medicaid Other | Admitting: Pharmacist Clinician (PhC)/ Clinical Pharmacy Specialist

## 2021-07-01 ENCOUNTER — Other Ambulatory Visit: Payer: Self-pay | Admitting: *Deleted

## 2021-07-01 ENCOUNTER — Telehealth: Payer: Self-pay | Admitting: *Deleted

## 2021-07-01 ENCOUNTER — Other Ambulatory Visit: Payer: Self-pay

## 2021-07-01 DIAGNOSIS — I2510 Atherosclerotic heart disease of native coronary artery without angina pectoris: Secondary | ICD-10-CM | POA: Diagnosis not present

## 2021-07-01 DIAGNOSIS — E7800 Pure hypercholesterolemia, unspecified: Secondary | ICD-10-CM | POA: Diagnosis not present

## 2021-07-01 DIAGNOSIS — I2583 Coronary atherosclerosis due to lipid rich plaque: Secondary | ICD-10-CM | POA: Diagnosis not present

## 2021-07-01 DIAGNOSIS — I10 Essential (primary) hypertension: Secondary | ICD-10-CM | POA: Diagnosis not present

## 2021-07-01 DIAGNOSIS — G4733 Obstructive sleep apnea (adult) (pediatric): Secondary | ICD-10-CM | POA: Diagnosis not present

## 2021-07-01 DIAGNOSIS — I5032 Chronic diastolic (congestive) heart failure: Secondary | ICD-10-CM | POA: Diagnosis not present

## 2021-07-01 MED ORDER — CHLORTHALIDONE 25 MG PO TABS: 25.0000 mg | ORAL_TABLET | Freq: Every day | ORAL | 3 refills | Status: DC

## 2021-07-01 MED ORDER — CLONIDINE 0.3 MG/24HR TD PTWK: 0.3000 mg | MEDICATED_PATCH | TRANSDERMAL | 12 refills | Status: DC

## 2021-07-01 NOTE — Assessment & Plan Note (Signed)
Patient on 40 mg atorvastatin daily. Need to recheck lipid panel/CMP. LDL goal < 55. May need to increase atorvastatin and/or need an additional agent.

## 2021-07-01 NOTE — Telephone Encounter (Signed)
-----   Message from Lewis sent at 06/26/2021  9:59 AM EST ----- CPAP titration

## 2021-07-01 NOTE — Assessment & Plan Note (Signed)
Agreed to CPAP titration sleep study in the future

## 2021-07-01 NOTE — Telephone Encounter (Signed)
Patient notified of CPAP titration appointment.

## 2021-07-01 NOTE — Patient Instructions (Signed)
Visit Information  Maria Hudson was given information about Medicaid Managed Care team care coordination services as a part of their [facility] Medicaid benefit. [name] verbally consented to engagement with the [facility] Managed Care team.   If you are experiencing a medical emergency, please call 911 or report to your local emergency department or urgent care.   If you have a non-emergency medical problem during routine business hours, please contact your provider's office and ask to speak with a nurse.   For questions related to your [facility] health plan, please call: [phone] or go here:[email or web address]  If you would like to schedule transportation through your [facility] plan, please call the following number at least 2 days in advance of your appointment: [phone].  Call the [place] at [phone], at any time, 24 hours a day, 7 days a week. If you are in danger or need immediate medical attention call 911.  If you would like help to quit smoking, call 1-800-QUIT-NOW 724-507-9487) OR Espaol: 1-855-Djelo-Ya (6-979-480-1655) o para ms informacin haga clic aqu or Text READY to 200-400 to register via text  Maria Hudson - following are the goals we discussed in your visit today:   Goals Addressed   Take medications as prescribed   Attend all scheduled provider appointments Call pharmacy for medication refills 3-7 days in advance of running out of medications Attend church or other social activities Perform all self care activities independently  Call provider office for new concerns or questions  Work with the social worker to address care coordination needs and will continue to work with the clinical team to address health care and disease management related needs call office if I gain more than 2 pounds in one day or 5 pounds in one week keep legs up while sitting track weight in diary use salt in moderation watch for  swelling in feet, ankles and legs every day weigh myself daily know when to call the doctorfor fluid weight gain or consistently abnormal BP readings at home track symptoms and what helps feel better or worse check blood sugar at prescribed times: three times daily enter blood sugar readings and medication or insulin into daily log take the blood sugar log to all doctor visits take the blood sugar meter to all doctor visits eat fish at least once per week fill half of plate with vegetables manage portion size check blood pressure daily write blood pressure results in a log or diary learn about high blood pressure take blood pressure log to all doctor appointments call doctor for signs and symptoms of high blood pressure keep all doctor appointments take medications for blood pressure exactly as prescribed Limit fluid intake to 1 liter per day Collaborate with managed Medicaid team of LCSW, BSW and pharmacist to address identified needs Use [place] Management calendar to record weight , BP and CBGs so readings are all in one place     The patient verbalized understanding of instructions provided today and agreed to review patient instructions via My Chart.  The Managed Medicaid care management team will reach out to the patient again over the next 30 days.   [name], [name], [place] Management Coordinator - Managed [place] High Risk [phone]   Following is a copy of your plan of care:  Care Plan : [place]  Updates made by [name], RN since 07/01/2021 12:00 AM  Problem: Knowledge Deficit and Care Coordination Needs Related to Management of HTN, HF, Type 2 DM   Priority: High     Long-Range Goal: Development of Plan of Care to Address Knowledge Deficits and Care Coordination Needs related to  management of CHF, CAD, HTN, HLD, DMII, CKD Stage 4, and Depression   Start Date:  05/06/2021  Expected End Date: 09/08/2021  Priority: High  Note:   Current Barriers:  Knowledge Deficits related to plan of care for management of CHF, CAD, HTN, HLD, DMII, CKD Stage 4, and Depression: depressed mood loss of energy/fatigue insomnia disturbed sleep decreased appetite difficulty concentrating recurrent thoughts of death  Care Coordination needs related to Housing barriers and [place] Concerns  and personal care servcies Patient says she spoke with [facility] transplant team social worker about kidney transplant in distant future, also spoke with [facility] counselor to address her depression, states she keeps all scheduled provider appointments  RNCM Clinical Goal(s):  Patient will demonstrate ongoing adherence to prescribed treatment plan for CHF, CAD, HTN, HLD, DMII, CKD Stage 4, and Depression as evidenced by patient report of improved health and quality of life and no unplanned ED or unplanned hospital admissions continue to work with RN Care Manager and/or Social Worker to address care management and care coordination needs related to CHF, CAD, HTN, HLD, DMII, CKD Stage 4, and Depression as evidenced by adherence to CM Team Scheduled appointments     work with pharmacist to address medication review and discuss ? medication side effect of chronic constipation and medication blister packs related to CHF, CAD, HTN, HLD, DMII, CKD Stage 4, and Depression as evidenced by review of EMR and patient or pharmacist report    through collaboration with RN Care manager, provider, and care team.  work with managed Medicaid LCSW to address mental health concerns related to the management of depression work with managed Medicaid BSW to address needs related to housing barriers as evidenced by patient receiving resources from BSW for low income housing options in [place] and to apply for personal care services  Interventions: Inter-disciplinary care team  collaboration (see longitudinal plan of care) Evaluation of current treatment plan related to  self management and patient's adherence to plan as established by provider Assessed status of low income housing and finding apartment     Heart Failure Interventions:  (Status: Goal on Track (progressing): YES.)  Long Term Goal - patient states her fluid retention has significantly improved,  says she cannot tolerate the compression stockings because they are too tight and make her legs hurt Assessed patient's heart failure management strategies  Diabetes:  (Status: Condition stable. Not addressed this visit.) Long Term Goal   Lab Results  Component Value Date   HGBA1C 6.7 (H) 04/29/2021  Assessed patient's understanding of A1C goal: <7% Reviewed medications with patient and discussed importance of medication adherence;        Counseled on importance of regular laboratory monitoring as prescribed;        Discussed plans with patient for ongoing care management follow up and provided patient with direct contact information for care management team;      Advised patient, providing education and rationale, to check cbg three times daily and record        call provider for findings outside established parameters;       Review of patient status, including review of consultants reports, relevant laboratory and other test results, and medications completed;  Hypertension: (Status: Goal on track: NO.) Long Term Goal- patient blood pressure continues to be very difficult to control due to orthostatic  hypotension episodes and elevated when sitting- patient is now working with cardiology pharmacist to transition to clonidine patch and manage BP Last practice recorded BP readings:   BP Readings from Last 3 Encounters:  07/01/21 (!) 160/74  05/27/21 (!) 163/68  05/26/21 (!) 158/78     Most recent eGFR/CrCl:  Lab Results  Component Value Date   EGFR 19 (L) 03/13/2021    No components found for:  CRCL  Evaluation of current treatment plan related to hypertension self management and patient's adherence to plan as established by provider;   Reviewed medications with patient and discussed importance of compliance;  Discussed plans with patient for ongoing care management follow up and provided patient with direct contact information for care management team; Advised patient, providing education and rationale, to monitor blood pressure daily and record, calling PCP for findings outside established parameters;  Reviewed scheduled/upcoming provider appointments including: with cardiologist on 07/01/21  Interdisciplinary Collaboration:  (Status: Goal on Track (progressing): YES.) Long Term Goal  Collaborated with BSW to initiate plan of care to address needs related to Housing barriers, Inability to perform ADL's independently, and Inability to perform IADL's independently in patient with CHF, CAD, HTN, HLD, DM, CKD Stage 4, and Depression- messaged managed Medicaid BSW Mickel Fuchs that patient is interested in applying for [place] and having her daughter be her paid assistant Assessed status of patient's involvement with behavioral health counselor Collaboration with pharmacist for medication review, discussion of ? Medication side effects causing chronic constipation and arranging for patient to received home delivery of medications in blister packs Collaboration with Nolene Ebbs, MD regarding development and update of comprehensive plan of care as evidenced by provider attestation and co-signature Inter-disciplinary care team collaboration (see longitudinal plan of care)  Patient Goals/Self-Care Activities: Take medications as prescribed   Attend all scheduled provider appointments Call pharmacy for medication refills 3-7 days in advance of running out of medications Attend church or other social activities Perform all self care activities independently  Call provider  office for new concerns or questions  Work with the social worker to address care coordination needs and will continue to work with the clinical team to address health care and disease management related needs call office if I gain more than 2 pounds in one day or 5 pounds in one week keep legs up while sitting track weight in diary use salt in moderation watch for swelling in feet, ankles and legs every day weigh myself daily know when to call the doctorfor fluid weight gain or consistently abnormal BP readings at home track symptoms and what helps feel better or worse check blood sugar at prescribed times: three times daily enter blood sugar readings and medication or insulin into daily log take the blood sugar log to all doctor visits take the blood sugar meter to all doctor visits eat fish at least once per week fill half of plate with vegetables manage portion size check blood pressure daily write blood pressure results in a log or diary learn about high blood pressure take blood pressure log to all doctor appointments call doctor for signs and symptoms of high blood pressure keep all doctor appointments take medications for blood pressure exactly as prescribed Limit fluid intake to 1 liter per day Collaborate with managed Medicaid team of LCSW, BSW and pharmacist to address identified needs Use [facility] Care Management calendar to record weight , BP and CBGs so readings are all in one place

## 2021-07-01 NOTE — Patient Outreach (Signed)
Medicaid Managed Care   Nurse Care Manager Note  07/01/2021 Name:  Maria Hudson MRN:  [id number] DOB:  Jun 17, 1960  Maria Hudson is an 61 y.o. year old female who is a primary patient of Maria Ebbs, Maria Hudson.  The [facility] Managed Care Coordination team was consulted for assistance with:    CHF CAD HTN DM CKD Stage4 Depression  Maria Hudson was given information about Medicaid Managed Care Coordination team services today. Maria Hudson Patient agreed to services and verbal consent obtained.  [name] for follow up visit in response to provider referral for case management and/or care coordination services.   Assessments/Interventions:  Review of past medical history, allergies, medications, health status, including review of consultants reports, laboratory and other test data, was performed as part of comprehensive evaluation and provision of chronic care management services.  SDOH (Social Determinants of Health) assessments and interventions performed:   Care Plan  Allergies  Allergen Reactions   Hydrocodone Itching    Patient able to tolerate when taken with Benadryl.   Oxycodone-Acetaminophen Itching    Patient able to tolerate when taken with Benadryl.    Medications Reviewed Today     Reviewed by Maria Hudson, Maria Hudson (Pharmacist) on 07/01/21 at 1520  Med List Status: <None>   Medication Order Taking? Sig Documenting Provider Last Dose Status Informant  acetaminophen (TYLENOL) 500 MG tablet [id number] Yes Take 1,000 mg by mouth every 6 (six) hours as needed for moderate pain or headache. Provider, Historical, Maria Hudson Taking Active Self  allopurinol (ZYLOPRIM) 100 MG tablet [id number] Yes Take 100 mg by mouth daily. Provider, Historical, Maria Hudson Taking Active Self  amLODipine (NORVASC) 10 MG tablet [id number] Yes Take 10 mg by mouth every morning. Provider, Historical, Maria Hudson Taking Active Self  aspirin EC 81 MG tablet [id number] Yes Take 81 mg by mouth daily.  Swallow whole. Provider, Historical, Maria Hudson Taking Active Self           Med Note [name]   Mon Apr 14, 2021  9:22 PM)    atorvastatin (LIPITOR) 40 MG tablet [id number] Yes Take 1 tablet (40 mg total) by mouth every evening. Maria Latch, Maria Hudson Taking Active   buPROPion [facility]) 100 MG tablet [id number] Yes Take 100 mg by mouth 2 (two) times daily. Provider, Historical, Maria Hudson Taking Active Self  carvedilol (COREG) 25 MG tablet [id number] Yes Take 1 tablet (25 mg total) by mouth 2 (two) times daily with a meal. Maria Hudson, Maria Picket, Maria Hudson Taking Active Self  chlorthalidone (HYGROTON) 25 MG tablet [id number] Yes Take 1 tablet (25 mg total) by mouth daily. Maria Latch, Maria Hudson  Active   cloNIDine (CATAPRES - DOSED IN MG/24 HR) 0.3 mg/24hr patch [id number] Yes Place 1 patch (0.3 mg total) onto the skin once a week. Maria Latch, Maria Hudson  Active   diclofenac Sodium (VOLTAREN) 1 % GEL [id number] Yes Apply 2 g topically 4 (four) times daily.  Patient taking differently: Apply 2 g topically 2 (two) times daily as needed (pain).   Maria Girt, Maria Hudson Taking Active Self           Med Note Maria Hudson, Maria Hudson May 06, 2021 12:59 PM) Use to treat left sided chest pain- non cardiac pain  dicyclomine (BENTYL) 10 MG capsule [id number] Yes Take 10 mg by mouth daily. Provider, Historical, Maria Hudson Taking Active   doxazosin (CARDURA) 8 MG tablet [id number] Yes Take 1 tablet (8 mg total) by mouth every evening. Maria Girt, Maria Hudson Taking Active  Self  ferrous sulfate 325 (65 FE) MG tablet [id number] Yes Take 325 mg by mouth 2 (two) times daily. Provider, Historical, Maria Hudson Taking Active   fluticasone (FLONASE) 50 MCG/ACT nasal spray [id number] Yes Place 1 spray into both nostrils daily as needed for allergies or rhinitis. Provider, Historical, Maria Hudson Taking Active Self  hydrALAZINE (APRESOLINE) 100 MG tablet [id number] Yes Take 100 mg by mouth 3 (three) times daily. Provider, Historical, Maria Hudson Taking Active   insulin glargine (LANTUS)  100 unit/mL SOPN [id number] Yes Inject 10 Units into the skin at bedtime. Provider, Historical, Maria Hudson Taking Active Self           Med Note Maria Hudson, Maria Hudson   Tue May 06, 2021  3:37 PM)    isosorbide mononitrate (IMDUR) 60 MG 24 hr tablet [id number] Yes Take 1 tablet (60 mg total) by mouth daily. Maria Dresser, Maria Hudson Taking Active Self  loratadine (CLARITIN) 10 MG tablet [id number] Yes Take 10 mg by mouth daily. Provider, Historical, Maria Hudson Taking Active Self  Multiple Vitamin (MULTIVITAMIN ADULT) TABS [id number] Yes Take 1 tablet by mouth daily. Provider, Historical, Maria Hudson Taking Active Self  nitroGLYCERIN (NITROSTAT) 0.4 MG SL tablet [id number] Yes Place 1 tablet (0.4 mg total) under the tongue every 5 (five) minutes as needed for chest pain. Maria Hudson, Maria Picket, Maria Hudson Taking Active Self           Med Note Maria Hudson, Maria Hudson   Tue Apr 29, 2021  9:30 PM)    NOVOLOG FLEXPEN 100 UNIT/ML FlexPen [id number] Yes Inject 8 Units into the skin 3 (three) times daily with meals. Provider, Historical, Maria Hudson Taking Active Self           Med Note Maria Hudson   Tue May 06, 2021  5:01 PM)    Omega-3 Fatty Acids (FISH OIL) 1000 MG CAPS [id number] Yes Take 1,000 mg by mouth daily. Provider, Historical, Maria Hudson Taking Active Self  oxyCODONE (ROXICODONE) 15 MG immediate release tablet [id number] Yes Take 1 tablet by mouth as needed. Provider, Historical, Maria Hudson Taking Active   pantoprazole (PROTONIX) 40 MG tablet [id number] Yes Take 1 tablet (40 mg total) by mouth daily. Maria Girt, Maria Hudson Taking Active Self  polyethylene glycol (MIRALAX / GLYCOLAX) 17 g packet [id number] Yes Take 17 g by mouth daily. Maria Hudson, Maria Picket, Maria Hudson Taking Active Self  pregabalin (LYRICA) 25 MG capsule [id number] Yes Take 25 mg by mouth 2 (two) times daily. Provider, Historical, Maria Hudson Taking Active Self  sertraline (ZOLOFT) 50 MG tablet [id number] Yes Take 50 mg by mouth daily. Provider, Historical, Maria Hudson Taking Active Self  spironolactone (ALDACTONE) 25 MG tablet  [id number] Yes Take 1 tablet (25 mg total) by mouth daily. Maria Girt, Maria Hudson Taking Active Self  torsemide (DEMADEX) 20 MG tablet [id number] Yes Take 1 tablet (20 mg total) by mouth daily. Joette Catching, PA-C Taking Active   traZODone (DESYREL) 50 MG tablet [id number] Yes Take 50 mg by mouth at bedtime. Provider, Historical, Maria Hudson Taking Active Self            Patient Active Problem List   Diagnosis Date Noted   OSA (obstructive sleep apnea) 05/27/2021   Pure hypercholesterolemia 05/27/2021   Chronic diastolic CHF (congestive heart failure) ([place]) 04/29/2021   Hypertensive urgency 04/14/2021   Acute on chronic diastolic CHF (congestive heart failure) ([place]) 04/14/2021   Chest pain 03/05/2021   CAD (coronary artery disease) 03/05/2021   T2DM (type 2 diabetes mellitus) ([place]) 03/05/2021   CKD (chronic kidney disease) stage  4, GFR 15-29 ml/min (HCC) 03/05/2021   Class 3 obesity ([place]) 03/05/2021   CVA (cerebral vascular accident) ([place]) 03/05/2021   Acute on chronic diastolic heart failure ([place]) 03/05/2021   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   AKI (acute kidney injury) ([place]) 03/05/2021    Care Plan : RN Care Manager Plan Of Care  Updates made by [name], RN since 07/01/2021 12:00 AM     Problem: Knowledge Deficit and Care Coordination Needs Related to Management of HTN, HF, Type 2 DM   Priority: High     Long-Range Goal: Development of Plan of Care to Address Knowledge Deficits and Care Coordination Needs related to  management of CHF, CAD, HTN, HLD, DMII, CKD Stage 4, and Depression   Start Date: 05/06/2021  Expected End Date: 09/08/2021  Priority: High  Note:   Current Barriers:  Knowledge Deficits related to plan of care for management of CHF, CAD, HTN, HLD, DMII, CKD Stage 4, and Depression: depressed mood loss of energy/fatigue insomnia disturbed sleep decreased appetite difficulty concentrating recurrent thoughts of death  Care Coordination needs related to  Housing barriers and [place] Concerns and personal care services Patient says she spoke with [facility] transplant team social worker about kidney transplant in distant future, also spoke with [facility] counselor to address her depression, states she keeps all scheduled provider appointments  RNCM Clinical Goal(s):  Patient will demonstrate ongoing adherence to prescribed treatment plan for CHF, CAD, HTN, HLD, DMII, CKD Stage 4, and Depression as evidenced by patient report of improved health and quality of life and no unplanned ED or unplanned hospital admissions continue to work with RN Care Manager and/or Social Worker to address care management and care coordination needs related to CHF, CAD, HTN, HLD, DMII, CKD Stage 4, and Depression as evidenced by adherence to CM Team Scheduled appointments     work with pharmacist to address medication review and discuss ? medication side effect of chronic constipation and medication blister packs related to CHF, CAD, HTN, HLD, DMII, CKD Stage 4, and Depression as evidenced by review of EMR and patient or pharmacist report    through collaboration with RN Care manager, provider, and care team.  work with managed Medicaid LCSW to address mental health concerns related to the management of depression work with managed Medicaid BSW to address needs related to housing barriers as evidenced by patient receiving resources from BSW for low income housing options in [place] and to apply for personal care services  Interventions: Inter-disciplinary care team collaboration (see longitudinal plan of care) Evaluation of current treatment plan related to  self management and patient's adherence to plan as established by provider Assessed status of low income housing and finding apartment     Heart Failure Interventions:  (Status: Goal on Track (progressing): YES.)  Long Term Goal - patient states her fluid retention has significantly improved,   says she cannot tolerate the compression stockings because they are too tight and make her legs hurt Assessed patient's heart failure management strategies  Diabetes:  (Status: Condition stable. Not addressed this visit.) Long Term Goal   Lab Results  Component Value Date   HGBA1C 6.7 (H) 04/29/2021  Assessed patient's understanding of A1C goal: <7% Reviewed medications with patient and discussed importance of medication adherence;        Counseled on importance of regular laboratory monitoring as prescribed;        Discussed plans with patient for ongoing care management follow up and provided  patient with direct contact information for care management team;      Advised patient, providing education and rationale, to check cbg three times daily and record        call provider for findings outside established parameters;       Review of patient status, including review of consultants reports, relevant laboratory and other test results, and medications completed;        Hypertension: (Status: Goal on track: NO.) Long Term Goal- patient blood pressure continues to be very difficult to control due to orthostatic  hypotension episodes and elevated when sitting- patient is now working with cardiology pharmacist to transition to clonidine patch and manage BP Last practice recorded BP readings:   BP Readings from Last 3 Encounters:  07/01/21 (!) 160/74  05/27/21 (!) 163/68  05/26/21 (!) 158/78     Most recent eGFR/CrCl:  Lab Results  Component Value Date   EGFR 19 (L) 03/13/2021    No components found for: CRCL  Evaluation of current treatment plan related to hypertension self management and patient's adherence to plan as established by provider;   Reviewed medications with patient and discussed importance of compliance;  Discussed plans with patient for ongoing care management follow up and provided patient with direct contact information for care management team; Advised patient,  providing education and rationale, to monitor blood pressure daily and record, calling PCP for findings outside established parameters;  Reviewed scheduled/upcoming provider appointments including: with cardiologist on 07/01/21  Interdisciplinary Collaboration:  (Status: Goal on Track (progressing): YES.) Long Term Goal  Collaborated with BSW to initiate plan of care to address needs related to Housing barriers, Inability to perform ADL's independently, and Inability to perform IADL's independently in patient with CHF, CAD, HTN, HLD, DM, CKD Stage 4, and Depression- messaged managed Medicaid BSW Mickel Fuchs that patient is interested in applying for [place] and having her daughter be her paid assistant Assessed status of patient's involvement with behavioral health counselor Collaboration with pharmacist for medication review, discussion of ? Medication side effects causing chronic constipation and arranging for patient to received home delivery of medications in blister packs Collaboration with Maria Ebbs, Maria Hudson regarding development and update of comprehensive plan of care as evidenced by provider attestation and co-signature Inter-disciplinary care team collaboration (see longitudinal plan of care)  Patient Goals/Self-Care Activities: Take medications as prescribed   Attend all scheduled provider appointments Call pharmacy for medication refills 3-7 days in advance of running out of medications Attend church or other social activities Perform all self care activities independently  Call provider office for new concerns or questions  Work with the social worker to address care coordination needs and will continue to work with the clinical team to address health care and disease management related needs call office if I gain more than 2 pounds in one day or 5 pounds in one week keep legs up while sitting track weight in diary use salt in moderation watch for swelling in feet,  ankles and legs every day weigh myself daily know when to call the doctorfor fluid weight gain or consistently abnormal BP readings at home track symptoms and what helps feel better or worse check blood sugar at prescribed times: three times daily enter blood sugar readings and medication or insulin into daily log take the blood sugar log to all doctor visits take the blood sugar meter to all doctor visits eat fish at least once per week fill half of plate with vegetables manage portion  size check blood pressure daily write blood pressure results in a log or diary learn about high blood pressure take blood pressure log to all doctor appointments call doctor for signs and symptoms of high blood pressure keep all doctor appointments take medications for blood pressure exactly as prescribed Limit fluid intake to 1 liter per day Collaborate with managed Medicaid team of LCSW, BSW and pharmacist to address identified needs Use [place] Management calendar to record weight , BP and CBGs so readings are all in one place       Follow Up:  Patient agrees to Care Plan and Follow-up.  Plan: The Managed Medicaid care management team will reach out to the patient again over the next 30 days.  Date/time of next scheduled RN care management/care coordination outreach:  07/29/21 at 3:00 pm  [name], [name], [place] Management Coordinator - Managed [place] High Risk [phone]

## 2021-07-01 NOTE — Patient Instructions (Addendum)
Return for a a follow up appointment February 9 at 8:30 am  Check your blood pressure at home daily and keep record of the readings.  Take your BP meds as follows:  If the clonidine patch is approved:   Day 1 - apply patch in AM and take normal 0.3 mg three times daily   Day 2 - take 1/2 tablet of clonidine (0.15 mg) three times daily   Day 3 - take 1/2 tablet of clonidine (0.15 mg) morning and night    Day 4 - stop clonidine tablets completely   Decrease chlorthalidone to 25 mg (1/2 of 50 mg tablet). Continue with all other medications   Bring all of your meds, your BP cuff and your record of home blood pressures to your next appointment.  Exercise as youre able, try to walk approximately 30 minutes per day.  Keep salt intake to a minimum, especially watch canned and prepared boxed foods.  Eat more fresh fruits and vegetables and fewer canned items.  Avoid eating in fast food restaurants.    HOW TO TAKE YOUR BLOOD PRESSURE: Rest 5 minutes before taking your blood pressure.  Dont smoke or drink caffeinated beverages for at least 30 minutes before. Take your blood pressure before (not after) you eat. Sit comfortably with your back supported and both feet on the floor (dont cross your legs). Elevate your arm to heart level on a table or a desk. Use the proper sized cuff. It should fit smoothly and snugly around your bare upper arm. There should be enough room to slip a fingertip under the cuff. The bottom edge of the cuff should be 1 inch above the crease of the elbow. Ideally, take 3 measurements at one sitting and record the average.

## 2021-07-01 NOTE — Assessment & Plan Note (Signed)
Patients BP in the clinic was still elevated at 160/74 and still reports significant dizziness when standing after sitting/lying down. Patient was counseled to continue current amlodipine 10 mg daily, carvedilol 12.5 mg BID, doxazosin 8 mg at night, hydralazine 50 mg TID, isosorbide mononitrate 60 mg daily, and spironolactone 25 mg daily. Changed chlorthalidone dose from 50 mg daily to 25 mg daily due to doses greater than 25 mg not having much more additive effect on lowering blood pressure and will also take some strain off her kidneys. Patient will take a  tablet of chlorthalidone 50 mg until she runs out. Starting the process to switch clonidine 0.3 mg tablets TID to clonidine weekly patch. Prescription sent to the pharmacy to see if Medicaid will cover it. Explained to patient the appropriate way to switch from her tablets to the patch: i. Day 1: place patch on arm and take normal clonidine dose (0.3 mg TID) ii. Day 2: patch remains and take a  tablet of clonidine TID  iii. Day 3: patch remains and take a  tablet of clonidine BID  iv. Day 4: continue wearing patch only and discontinue clonidine tablets  v. Patient was counseled on how to apply the patch properly and to place the clear adhesive patch on top of the clonidine patch to ensure it stays. Patches are worn for one week and was recommended to rotate applications sites and do not cut any of the patches.  Patient scheduled for follow-up on Feb 9th @ 8:30.

## 2021-07-01 NOTE — Progress Notes (Signed)
07/01/2021 [name] [birth year] [id number]   HPI: Hiram Comber is a 61 y/o female with a history of chronic diastolic heart failure, diabetes mellitus, CKD 3, hypertension, and stroke, here today for hypertension follow-up. Ms. Summey established care in the advanced hypertension clinic on 03/12/2021. Prior to establishment, she was hospitalized for chest pain with a blood pressure of 189/90. Her EKG showed no ischemic changes and ECHO showed LVEF of 55-60% with no significant valvular disease. She reported labile blood pressures that fluctuate from the 130s to the 200s. She had significant orthostasis in the office, where her systolic was 037 lying down and upon standing the systolic dropped to 096. Previous labs showed thyroid function, renin, and aldosterone were normal, as were catecholamines and metanephrines. Renal artery dopplers were also normal. She had a right heart catheterization on 05/14/2021 which revealed normal filling pressures and prominent V waves. There was no significant mitral regurgitation so was thought to be due to diastolic dysfunction. Her last visit with Dr. Oval Linsey was on 05/27/2021 where her blood pressure was 163/68. She reported feeling very tired, lightheadedness when standing, continued volume overload, and occasional chest pains. She tries to do some exercises with a machine for her thighs and arms twice a day. She gets very tired when doing this but no chest pain or pressure. She has noted some lower extremity edema but denies orthopnea or PND.   Today, she is feeling much better than yesterday but still has some days where she does not feel well. Reported she was so tired yesterday that she could not get up and move. She barely made it out of bed. She was in very good spirits today and    Previous Antihypertensives: Losartan - elevated SCr   In-Clinic BP Readings (07/01/2021) Sitting: 162/72  Standing, immediately: 138/62  Standing, after 1 minute: 126/58 Standing,  after 2 minutes: 115/56 Standing, after 3 minutes: 118/60   PMH: Acute on chronic diastolic heart failure  Congestive heart failure  CKD stage 4 GFR 15-29 mL/min Diabetes mellitus - A1c 6.7 (04/29/2021) - controlled on insulin  Resistant Hypertension Obstructive sleep apnea Hypercholesterolemia - atorvastatin 40 mg (LDL 149) Stroke - aspirin 81 mg and atorvastatin 40 mg (LDL 149 on 02/27/2021)  Blood Pressure Goal < 130/80   Current Medications: Amlodipine 10 mg daily  Carvedilol 25 mg BID  Chlorthalidone 50 mg daily Clonidine 0.3 mg TID  Doxazosin 8 mg QPM Hydralazine 50 mg TID   Spironolactone 25 mg daily Isosorbide mononitrate 60 mg daily     Family Hx:  Father died when she was 89; mother had resistant hypertension, had major stroke; sister had high blood pressure and passed away from kidney failure; daughter (70) and son (78) both have hypertension but not resistant   Social Hx:  No tobacco use, may have an alcoholic cooler once a year, one cup decaf coffee per day, drinks some decaf tea, drinks [employer]. [employer] maybe once a month   Diet: Started vegetarian diet this week, squash, zucchini, potatoes, carrots  Does not snack, sometimes eats honey buns but not every often, not adding salt   Exercise: Does some stretching, does most stretching while she sitting   Home BP readings: Morning average: (8 readings from past 3 weeks)  147/71 Morning average after standing: (6 readings)          94/64  Intolerances: Hydrocodone - itching  Percocet - itching   Labs:   06/05/2021: Na 139, K 3.9, BUN 78, SCr  2.71, eGFR 20, Glu 160 04/29/2021: A1c 6.7  02/27/2021:  TC 230, LDL 149, HDL 60, TG 103    Wt Readings from Last 3 Encounters:  07/01/21 221 lb (100.2 kg)  06/01/21 237 lb (107.5 kg)  05/27/21 234 lb 14.4 oz (106.5 kg)   BP Readings from Last 3 Encounters:  07/01/21 (!) 160/74  05/27/21 (!) 163/68  05/26/21 (!) 158/78   Pulse Readings from Last 3 Encounters:   07/01/21 60  05/27/21 61  05/26/21 61    Current Outpatient Medications  Medication Sig Dispense Refill   acetaminophen (TYLENOL) 500 MG tablet Take 1,000 mg by mouth every 6 (six) hours as needed for moderate pain or headache.     allopurinol (ZYLOPRIM) 100 MG tablet Take 100 mg by mouth daily.     amLODipine (NORVASC) 10 MG tablet Take 10 mg by mouth every morning.     aspirin EC 81 MG tablet Take 81 mg by mouth daily. Swallow whole.     atorvastatin (LIPITOR) 40 MG tablet Take 1 tablet (40 mg total) by mouth every evening. 90 tablet 3   buPROPion (WELLBUTRIN) 100 MG tablet Take 100 mg by mouth 2 (two) times daily.     carvedilol (COREG) 25 MG tablet Take 1 tablet (25 mg total) by mouth 2 (two) times daily with a meal. 60 tablet 0   chlorthalidone (HYGROTON) 25 MG tablet Take 1 tablet (25 mg total) by mouth daily. 90 tablet 3   cloNIDine (CATAPRES - DOSED IN MG/24 HR) 0.3 mg/24hr patch Place 1 patch (0.3 mg total) onto the skin once a week. 4 patch 12   diclofenac Sodium (VOLTAREN) 1 % GEL Apply 2 g topically 4 (four) times daily. (Patient taking differently: Apply 2 g topically 2 (two) times daily as needed (pain).)     dicyclomine (BENTYL) 10 MG capsule Take 10 mg by mouth daily.     doxazosin (CARDURA) 8 MG tablet Take 1 tablet (8 mg total) by mouth every evening. 30 tablet 1   ferrous sulfate 325 (65 FE) MG tablet Take 325 mg by mouth 2 (two) times daily.     fluticasone (FLONASE) 50 MCG/ACT nasal spray Place 1 spray into both nostrils daily as needed for allergies or rhinitis.     hydrALAZINE (APRESOLINE) 100 MG tablet Take 100 mg by mouth 3 (three) times daily.     insulin glargine (LANTUS) 100 unit/mL SOPN Inject 10 Units into the skin at bedtime.     isosorbide mononitrate (IMDUR) 60 MG 24 hr tablet Take 1 tablet (60 mg total) by mouth daily. 90 tablet 3   loratadine (CLARITIN) 10 MG tablet Take 10 mg by mouth daily.     Multiple Vitamin (MULTIVITAMIN ADULT) TABS Take 1 tablet by  mouth daily.     nitroGLYCERIN (NITROSTAT) 0.4 MG SL tablet Place 1 tablet (0.4 mg total) under the tongue every 5 (five) minutes as needed for chest pain. 20 tablet 0   NOVOLOG FLEXPEN 100 UNIT/ML FlexPen Inject 8 Units into the skin 3 (three) times daily with meals.     Omega-3 Fatty Acids (FISH OIL) 1000 MG CAPS Take 1,000 mg by mouth daily.     oxyCODONE (ROXICODONE) 15 MG immediate release tablet Take 1 tablet by mouth as needed.     pantoprazole (PROTONIX) 40 MG tablet Take 1 tablet (40 mg total) by mouth daily. 30 tablet 0   polyethylene glycol (MIRALAX / GLYCOLAX) 17 g packet Take 17 g by mouth daily. [place]  each 0   pregabalin (LYRICA) 25 MG capsule Take 25 mg by mouth 2 (two) times daily.     sertraline (ZOLOFT) 50 MG tablet Take 50 mg by mouth daily.     spironolactone (ALDACTONE) 25 MG tablet Take 1 tablet (25 mg total) by mouth daily. 30 tablet 1   torsemide (DEMADEX) 20 MG tablet Take 1 tablet (20 mg total) by mouth daily. 30 tablet 4   traZODone (DESYREL) 50 MG tablet Take 50 mg by mouth at bedtime.     No current facility-administered medications for this visit.    Allergies  Allergen Reactions   Hydrocodone Itching    Patient able to tolerate when taken with Benadryl.   Oxycodone-Acetaminophen Itching    Patient able to tolerate when taken with Benadryl.    Past Medical History:  Diagnosis Date   Acute on chronic diastolic heart failure ([place]) 03/05/2021   CHF (congestive heart failure) (HCC)    CKD (chronic kidney disease) stage 4, GFR 15-29 ml/min ([place]) 03/05/2021   Diabetes mellitus without complication ([place])    Hypertension    Hypertensive urgency 04/14/2021   OSA (obstructive sleep apnea) 05/27/2021   Pure hypercholesterolemia 05/27/2021   Renal disorder    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   Stroke ([place])     Blood pressure (!) 160/74, pulse 60, resp. rate 16, height '5\' 3"'  (1.6 m), weight 221 lb (100.2 kg), SpO2 93 %.  Resistant hypertension Patients BP in the  clinic was still elevated at 160/74 and still reports significant dizziness when standing after sitting/lying down. Patient was counseled to continue current amlodipine 10 mg daily, carvedilol 12.5 mg BID, doxazosin 8 mg at night, hydralazine 50 mg TID, isosorbide mononitrate 60 mg daily, and spironolactone 25 mg daily. Changed chlorthalidone dose from 50 mg daily to 25 mg daily due to doses greater than 25 mg not having much more additive effect on lowering blood pressure and will also take some strain off her kidneys. Patient will take a  tablet of chlorthalidone 50 mg until she runs out. Starting the process to switch clonidine 0.3 mg tablets TID to clonidine weekly patch. Prescription sent to the pharmacy to see if Medicaid will cover it. Explained to patient the appropriate way to switch from her tablets to the patch: Day 1: place patch on arm and take normal clonidine dose (0.3 mg TID) Day 2: patch remains and take a  tablet of clonidine TID  Day 3: patch remains and take a  tablet of clonidine BID  Day 4: continue wearing patch only and discontinue clonidine tablets  Patient was counseled on how to apply the patch properly and to place the clear adhesive patch on top of the clonidine patch to ensure it stays. Patches are worn for one week and was recommended to rotate applications sites and do not cut any of the patches.  Patient scheduled for follow-up on Feb 9th @ 8:30.   CAD (coronary artery disease) Continue aspirin 81 mg daily, atorvastatin 40 mg daily, carvedilol 25 mg BID. Need to update lipid profile, last LDL was 149 (02/27/2021). Patient LDL goal < 55 due to established ASCVD risk plus diabetes.  Chronic diastolic CHF (congestive heart failure) (HCC) Reports not experiencing any edema today and that the fluid is all off. Said she is taking torsemide 20 mg every other day.   Pure hypercholesterolemia Patient on 40 mg atorvastatin daily. Need to recheck lipid panel/CMP. LDL goal < 55.  May need to increase atorvastatin and/or need  an additional agent.   OSA (obstructive sleep apnea) Agreed to CPAP titration sleep study in the future   Tommy Medal PharmD CPP [facility] [address] [place], [place] [address] [phone]

## 2021-07-01 NOTE — Assessment & Plan Note (Signed)
Continue aspirin 81 mg daily, atorvastatin 40 mg daily, carvedilol 25 mg BID. Need to update lipid profile, last LDL was 149 (02/27/2021). Patient LDL goal < 55 due to established ASCVD risk plus diabetes.

## 2021-07-01 NOTE — Assessment & Plan Note (Signed)
Reports not experiencing any edema today and that the fluid is all off. Said she is taking torsemide 20 mg every other day.

## 2021-07-04 ENCOUNTER — Ambulatory Visit: Payer: Self-pay

## 2021-07-04 NOTE — Progress Notes (Signed)
PCP: Nolene Ebbs, MD Kidney: Dr. Justin Mend HF Cardiology: Dr. Aundra Dubin  61 y.o. with history of supine hypertension with orthostatic hypotension, CKD stage 4, CAD, and chronic diastolic CHF was referred from [facility] clinic for evaluation of CHF.    Patient had MI in 05/2013. LHC showed 20% LAD, 50 to 60% left circumflex, and 100% RPDA stenosis with left-to-right collaterals. CAD managed medically.    She reports a long h/o diastolic heart failure and frequent hospitalizations. She moved to [place] from [facility], had been followed by [facility]. Has struggled w/ volume control and readjustment of diuretics over the years. She reports previously being prescribed [place] Glade but had to stop b/c her insurance would not cover it. Her last echo at [facility] was 7/21. LVEF was 55%, RV normal. RVSP elevated at 60 mmHg. PYP at [facility] was negative for amyloid (grade 1, H/CL 1.21). Serum IFE not suggestive of monoclonal gammopathy. Urine IFE and immunoglobin free light chains were elevated (lg FLCK 9.24, Ig FLCL 3.75, K/L ratio 2.46. Repeat testing recommended 6 months later, but do not see that this was completed).    Referred to HTN clinic 9/22 for uncontrolled HTN. Now on multidrug regimen. Thyroid function, renin, and aldosterone were normal, as were catecholamines and metanephrines. Her PTH was elevated at 134 but calcium was normal. Renal artery doppler study was limited. There was no evidence of right RAS however the left renal artery was not well visualized due to the presence of bowel gas.  Echo was repeated 9/22 showing normal LVEF 55-60%, G1DD and normal RV.    She has struggled w/ fluid management recently, c/b stage IV CKD. Baseline SCr ~2.3.  Followed by nephrology. Previously on Bumex and metolazone. She was admitted 17/79 for a/c diastolic CHF and diuresed w/ IV Lasix. Did not get RHC. Transitioned back to PO diuretics, Torsemide 40 mg once daily + spiro 25 mg daily. D/w Wt 231 lb. Referred to [facility] clinic.   She was  admitted again in 11/22 with chest pain, AKI, and orthostatic hypotension.  Torsemide was stopped.  HS-TnI was negative, suspect musculoskeletal chest pain.    Seen for HF f/u 11/22. Continued to struggle with orthostatic symptoms. Prescribed compression stockings and abdominal binder. Imdur reduced. Mildly volume up and Torsemide restarted.   [place] 05/14/21 with normal filling pressures but prominent v-waves in PCWP tracing (no significant MR on echo, likely d/t diastolic dysfunction), mild pulmonary venous hypertension, CO  Repeat PYP scan 12/22 not suggestive of transthyretin cardiac amyloidosis.  Today she returns for HF follow up. She remains dizzy with position changes and head movements, but no syncope. She is wearing a girdle (not abdominal binder) and feels symptoms slightly improved. Has compression hose but they are uncomfortable. She remains SOB with minimal activity. Denies CP, dizziness, edema, or abnormal bleeding. She chronically sleeps in recliner x 2 years. Appetite ok. No fever or chills. Weight at home 222 pounds. Taking all medications. BP at home ~ 120-140s/70-80s.  She is seeing her GI soon.  Labs (11/22): K 3.8, creatinine 3.6 => 2.62 Labs (12/22): K 3.9, creatinine 2.71   PMH: 1. HTN: Urine catecholamines and metanephrines normal level, aldosterone normal level.  Complicated by orthostatic hypotension.   2. Orthostatic hypotension 3. CKD stage 4 4. Type 2 diabetes 5. OSA: Uses CPAP 6. CAD: LHC in 2014 with totally occluded RCA with collaterals, medical management.  7. Chronic diastolic CHF: Echo (3/90) with EF 55%, normal RV, PASP 60 mmHg.  - PYP scan 11/20 at  [facility] was negative with grade 1, H/CL 1.21.  - Echo (9/22): EF 55-60%, mild LVH, normal RV, IVC normal.  - RHC (11/22): Normal filling pressures but with prominent v-waves in PCWP tracing. No significant MR, likely d/t diastolic dysfunction. Mild pulmonary venous hypertension. Preserved CO.   Social History    Socioeconomic History   Marital status: Divorced    Spouse name: Not on file   Number of children: 2   Years of education: Not on file   Highest education level: Some college, no degree  Occupational History   Occupation: retired  Tobacco Use   Smoking status: Never    Passive exposure: Never   Smokeless tobacco: Never  Vaping Use   Vaping Use: Never used  Substance and Sexual Activity   Alcohol use: Not Currently   Drug use: Not Currently   Sexual activity: Not on file  Other Topics Concern   Not on file  Social History Narrative   Not on file   Social Determinants of Health   Financial Resource Strain: Low Risk    Difficulty of Paying Living Expenses: Not very hard  Food Insecurity: No Food Insecurity   Worried About Charity fundraiser in the Last Year: Never true   [place] in the Last Year: Never true  Transportation Needs: No Transportation Needs   Lack of Transportation (Medical): No   Lack of Transportation (Non-Medical): No  Physical Activity: Inactive   Days of Exercise per Week: 0 days   Minutes of Exercise per Session: 0 min  Stress: Stress Concern Present   Feeling of Stress : Very much  Social Connections: Moderately Isolated   Frequency of Communication with Friends and Family: More than three times a week   Frequency of Social Gatherings with Friends and Family: More than three times a week   Attends Religious Services: More than 4 times per year   Active Member of [employer] or Organizations: No   Attends Music therapist: Never   Marital Status: Divorced  Human resources officer Violence: Not At Risk   Fear of Current or Ex-Partner: No   Emotionally Abused: No   Physically Abused: No   Sexually Abused: No   Family History  Problem Relation Age of Onset   Hypertension Mother    Stroke Mother    Hypertension Father    Hypertension Sister    Hypertension Son    Hypertension Daughter    ROS: All systems reviewed and negative except  as per HPI.   Current Outpatient Medications  Medication Sig Dispense Refill   acetaminophen (TYLENOL) 500 MG tablet Take 1,000 mg by mouth every 6 (six) hours as needed for moderate pain or headache.     allopurinol (ZYLOPRIM) 100 MG tablet Take 100 mg by mouth daily.     amLODipine (NORVASC) 10 MG tablet Take 10 mg by mouth every morning.     aspirin EC 81 MG tablet Take 81 mg by mouth daily. Swallow whole.     atorvastatin (LIPITOR) 40 MG tablet Take 1 tablet (40 mg total) by mouth every evening. 90 tablet 3   buPROPion (WELLBUTRIN) 100 MG tablet Take 100 mg by mouth 2 (two) times daily.     carvedilol (COREG) 25 MG tablet Take 1 tablet (25 mg total) by mouth 2 (two) times daily with a meal. 60 tablet 0   chlorthalidone (HYGROTON) 25 MG tablet Take 1 tablet (25 mg total) by mouth daily. 90 tablet 3   cloNIDine (  CATAPRES - DOSED IN MG/24 HR) 0.3 mg/24hr patch Place 1 patch (0.3 mg total) onto the skin once a week. 4 patch 12   diclofenac Sodium (VOLTAREN) 1 % GEL Apply 2 g topically 4 (four) times daily. (Patient taking differently: Apply 2 g topically 2 (two) times daily as needed (pain).)     dicyclomine (BENTYL) 10 MG capsule Take 10 mg by mouth daily. As needed     doxazosin (CARDURA) 8 MG tablet Take 1 tablet (8 mg total) by mouth every evening. 30 tablet 1   ferrous sulfate 325 (65 FE) MG tablet Take 325 mg by mouth 2 (two) times daily.     fluticasone (FLONASE) 50 MCG/ACT nasal spray Place 1 spray into both nostrils daily as needed for allergies or rhinitis.     hydrALAZINE (APRESOLINE) 100 MG tablet Take 100 mg by mouth 3 (three) times daily.     insulin glargine (LANTUS) 100 unit/mL SOPN Inject 10 Units into the skin at bedtime.     isosorbide mononitrate (IMDUR) 60 MG 24 hr tablet Take 1 tablet (60 mg total) by mouth daily. 90 tablet 3   loratadine (CLARITIN) 10 MG tablet Take 10 mg by mouth daily.     Multiple Vitamin (MULTIVITAMIN ADULT) TABS Take 1 tablet by mouth daily.      nitroGLYCERIN (NITROSTAT) 0.4 MG SL tablet Place 1 tablet (0.4 mg total) under the tongue every 5 (five) minutes as needed for chest pain. 20 tablet 0   NOVOLOG FLEXPEN 100 UNIT/ML FlexPen Inject 8 Units into the skin 3 (three) times daily with meals.     Omega-3 Fatty Acids (FISH OIL) 1000 MG CAPS Take 1,000 mg by mouth daily.     oxyCODONE (ROXICODONE) 15 MG immediate release tablet Take 1 tablet by mouth as needed.     pantoprazole (PROTONIX) 40 MG tablet Take 1 tablet (40 mg total) by mouth daily. 30 tablet 0   polyethylene glycol (MIRALAX / GLYCOLAX) 17 g packet Take 17 g by mouth daily. 14 each 0   pregabalin (LYRICA) 25 MG capsule Take 25 mg by mouth 2 (two) times daily.     sertraline (ZOLOFT) 50 MG tablet Take 50 mg by mouth daily.     spironolactone (ALDACTONE) 25 MG tablet Take 1 tablet (25 mg total) by mouth daily. 30 tablet 1   torsemide (DEMADEX) 20 MG tablet Patient takes 1 tablet by mouth every other day as needed for fluid retention.     traZODone (DESYREL) 50 MG tablet Take 50 mg by mouth at bedtime.     No current facility-administered medications for this encounter.   Wt Readings from Last 3 Encounters:  07/07/21 100.8 kg (222 lb 3.2 oz)  07/01/21 100.2 kg (221 lb)  06/01/21 107.5 kg (237 lb)    BP (!) 168/80    Pulse 64    Wt 100.8 kg (222 lb 3.2 oz)    SpO2 97%    BMI 39.36 kg/m  General:  NAD. No resp difficulty, arrived in [facility] HEENT: Normal Neck: Supple. No JVD. Carotids 2+ bilat; no bruits. No lymphadenopathy or thryomegaly appreciated. Cor: PMI nondisplaced. Regular rate & rhythm. No rubs, gallops or murmurs. Lungs: Clear Abdomen: Obese, nontender, nondistended. No hepatosplenomegaly. No bruits or masses. Good bowel sounds. Extremities: No cyanosis, clubbing, rash, trace pedal edema Neuro: Alert & oriented x 3, cranial nerves grossly intact. Moves all 4 extremities w/o difficulty. Affect pleasant.  Assessment/Plan: 1. Supine hypertension with orthostatic  hypotension: This is difficult  to manage.  Resting BP remains high today, but home log shows improved readings. She still has orthostatic with symptoms.  Workup for secondary hypertension has been negative.  I strongly encouraged her to wear her girdle (favor abdominal binder if she can tolerate it) and compression hose continuously during the day. There was concern for autonomic neuropathy from amyloidosis, however repeat PYP 12/22 not suggestive of TTR amyloidosis. She is now being followed by the HTN clinic. - Continue Coreg 25 mg bid. - Continue chlorthalidone 25 mg daily.  - Continue clonidine patch 0.3 mg q week. - Continue hydralazine 100 mg tid + Imdur 60 mg daily. - Continue spiro 25 mg daily.  - Continue doxazosin 8 mg q hs. - Continue amlodipine 10 mg daily.  2. Chronic diastolic CHF: Echo in 3/14 with EF 55-60%, normal RV.  She has struggled with volume overload in setting of CKD stage IV. NYHA IIIb, functional class difficult due to orthostatic symptoms and physical deconditioning. She is not volume overloaded today. - Continue torsemide 20 mg every other day. BMET/BNP today. - Continue spiro 25 mg daily. - Somewhat equivocal PYP scan in 11/20 (grade 1, H/CL 1.21).  Given diastolic CHF and possible autonomic neuropathy, repeat PYP scan 12/22 not suggestive of TTR cardiac amyloidosis.  Repeat myeloma penal and urine immunofixation 11/22 negative. - RHC (11/22): Normal filling pressures but with prominent v-waves in PCWP tracing. No significant MR, likely d/t diastolic dysfunction. Mild pulmonary venous hypertension. Preserved CO.  - Sleep study arranged 08/10/21. 3. CKD stage IV: Check BMET today.  4. CAD: H/o occluded PDA with collaterals.  No chest pain.  - Continue ASA 81 daily.  - Continue atorvastatin 40 mg daily. - Continue Imdur 60 mg daily.  Follow up with Dr. Aundra Dubin as scheduled 08/19/2021.  [place]onda FNP 07/07/2021

## 2021-07-07 ENCOUNTER — Encounter (HOSPITAL_COMMUNITY): Payer: Self-pay

## 2021-07-07 ENCOUNTER — Ambulatory Visit (HOSPITAL_COMMUNITY)
Admission: RE | Admit: 2021-07-07 | Discharge: 2021-07-07 | Disposition: A | Discharge status: Home / Self Care | Payer: Medicaid Other | Source: Ambulatory Visit | Attending: Family Medicine | Admitting: Family Medicine

## 2021-07-07 ENCOUNTER — Other Ambulatory Visit: Payer: Self-pay

## 2021-07-07 ENCOUNTER — Telehealth (HOSPITAL_COMMUNITY): Payer: Self-pay | Admitting: Cardiology

## 2021-07-07 VITALS — BP 168/80 | HR 64 | Wt 222.2 lb

## 2021-07-07 DIAGNOSIS — I130 Hypertensive heart and chronic kidney disease with heart failure and stage 1 through stage 4 chronic kidney disease, or unspecified chronic kidney disease: Secondary | ICD-10-CM | POA: Insufficient documentation

## 2021-07-07 DIAGNOSIS — I10 Essential (primary) hypertension: Secondary | ICD-10-CM | POA: Diagnosis not present

## 2021-07-07 DIAGNOSIS — I5032 Chronic diastolic (congestive) heart failure: Secondary | ICD-10-CM

## 2021-07-07 DIAGNOSIS — I951 Orthostatic hypotension: Secondary | ICD-10-CM | POA: Insufficient documentation

## 2021-07-07 DIAGNOSIS — Z8249 Family history of ischemic heart disease and other diseases of the circulatory system: Secondary | ICD-10-CM | POA: Diagnosis not present

## 2021-07-07 DIAGNOSIS — I272 Pulmonary hypertension, unspecified: Secondary | ICD-10-CM | POA: Diagnosis not present

## 2021-07-07 DIAGNOSIS — N184 Chronic kidney disease, stage 4 (severe): Secondary | ICD-10-CM

## 2021-07-07 DIAGNOSIS — E1122 Type 2 diabetes mellitus with diabetic chronic kidney disease: Secondary | ICD-10-CM | POA: Diagnosis not present

## 2021-07-07 DIAGNOSIS — I2510 Atherosclerotic heart disease of native coronary artery without angina pectoris: Secondary | ICD-10-CM | POA: Diagnosis not present

## 2021-07-07 DIAGNOSIS — Z791 Long term (current) use of non-steroidal anti-inflammatories (NSAID): Secondary | ICD-10-CM | POA: Diagnosis not present

## 2021-07-07 DIAGNOSIS — I2583 Coronary atherosclerosis due to lipid rich plaque: Secondary | ICD-10-CM

## 2021-07-07 DIAGNOSIS — Z7982 Long term (current) use of aspirin: Secondary | ICD-10-CM | POA: Insufficient documentation

## 2021-07-07 DIAGNOSIS — Z794 Long term (current) use of insulin: Secondary | ICD-10-CM | POA: Diagnosis not present

## 2021-07-07 DIAGNOSIS — G4733 Obstructive sleep apnea (adult) (pediatric): Secondary | ICD-10-CM | POA: Insufficient documentation

## 2021-07-07 DIAGNOSIS — Z79899 Other long term (current) drug therapy: Secondary | ICD-10-CM | POA: Insufficient documentation

## 2021-07-07 LAB — BRAIN NATRIURETIC PEPTIDE: B Natriuretic Peptide: 27.6 pg/mL (ref 0.0–100.0)

## 2021-07-07 LAB — BASIC METABOLIC PANEL
Anion gap: 11 (ref 5–15)
BUN: 113 mg/dL — ABNORMAL HIGH (ref 6–20)
CO2: 21 mmol/L — ABNORMAL LOW (ref 22–32)
Calcium: 9.3 mg/dL (ref 8.9–10.3)
Chloride: 103 mmol/L (ref 98–111)
Creatinine, Ser: 4.45 mg/dL — ABNORMAL HIGH (ref 0.44–1.00)
GFR, Estimated: 11 mL/min — ABNORMAL LOW (ref >60)
Glucose, Bld: 171 mg/dL — ABNORMAL HIGH (ref 70–99)
Potassium: 5.1 mmol/L (ref 3.5–5.1)
Sodium: 135 mmol/L (ref 135–145)

## 2021-07-07 MED ORDER — ISOSORBIDE MONONITRATE ER 60 MG PO TB24: 90.0000 mg | ORAL_TABLET | Freq: Every day | ORAL | 3 refills | Status: DC

## 2021-07-07 MED ORDER — TORSEMIDE 20 MG PO TABS: 20.0000 mg | ORAL_TABLET | ORAL | 3 refills | Status: DC

## 2021-07-07 NOTE — Patient Outreach (Addendum)
Medicaid Managed Care Social Work Note  07/07/2021 Name:  Maria Hudson MRN:  [id number] DOB:  12/27/1960  Maria Hudson is an 61 y.o. year old female who is a primary patient of Nolene Ebbs, MD.  The Medicaid Managed Care Coordination team was consulted for assistance with:   PCS and housing resources  [name] was given information about Medicaid Managed Care Coordination team services today. Maria Hudson Patient agreed to services and verbal consent obtained.  Engaged with patient  for by telephone forfollow up visit in response to referral for case management and/or care coordination services.   Assessments/Interventions:  Review of past medical history, allergies, medications, health status, including review of consultants reports, laboratory and other test data, was performed as part of comprehensive evaluation and provision of chronic care management services.  SDOH: (Social Determinant of Health) assessments and interventions performed: BSW completed a follow up telephone call with patient. Patient stated she would like resources for her daughter to get paid to cook for her since she is unable too. BSW informed patient the only program would be CAP and she may not qualify for it. BSW resent an email to [employer] with housing resources. No other resources needed at this time.    Advanced Directives Status:  Not addressed in this encounter.  Care Plan                 Allergies  Allergen Reactions   Hydrocodone Itching    Patient able to tolerate when taken with Benadryl.   Oxycodone-Acetaminophen Itching    Patient able to tolerate when taken with Benadryl.    Medications Reviewed Today     Reviewed by Rafael Bihari, FNP (Family Nurse Practitioner) on 07/07/21 at 1008  Med List Status: <None>   Medication Order Taking? Sig Documenting Provider Last Dose Status Informant  acetaminophen (TYLENOL) 500 MG tablet [id number] Yes Take 1,000 mg by mouth every 6 (six) hours as  needed for moderate pain or headache. [provider] Taking Active Self  allopurinol (ZYLOPRIM) 100 MG tablet [id number] Yes Take 100 mg by mouth daily. [provider] Taking Active Self  amLODipine (NORVASC) 10 MG tablet [id number] Yes Take 10 mg by mouth every morning. [provider] Taking Active Self  aspirin EC 81 MG tablet [id number] Yes Take 81 mg by mouth daily. Swallow whole. [provider] Taking Active Self           Med Note [name]   Mon Apr 14, 2021  9:22 PM)    atorvastatin (LIPITOR) 40 MG tablet [id number] Yes Take 1 tablet (40 mg total) by mouth every evening. Skeet Latch, MD Taking Active   buPROPion [facility]) 100 MG tablet [id number] Yes Take 100 mg by mouth 2 (two) times daily. [provider] Taking Active Self  carvedilol (COREG) 25 MG tablet [id number] Yes Take 1 tablet (25 mg total) by mouth 2 (two) times daily with a meal. [name], [name], MD Taking Active Self  chlorthalidone (HYGROTON) 25 MG tablet [id number] Yes Take 1 tablet (25 mg total) by mouth daily. Skeet Latch, MD Taking Active   cloNIDine (CATAPRES - DOSED IN MG/24 HR) 0.3 mg/24hr patch [id number] Yes Place 1 patch (0.3 mg total) onto the skin once a week. Skeet Latch, MD Taking Active   diclofenac Sodium (VOLTAREN) 1 % GEL [id number] Yes Apply 2 g topically 4 (four) times daily.  Patient taking differently: Apply 2 g topically 2 (two) times daily as needed (pain).   Eliseo Squires,  [name], DO Taking Active Self           Med Note [name], [name] May 06, 2021 12:59 PM) Use to treat left sided chest pain- non cardiac pain  dicyclomine (BENTYL) 10 MG capsule [id number] Yes Take 10 mg by mouth daily. As needed [provider] Taking Active   doxazosin (CARDURA) 8 MG tablet [id number] Yes Take 1 tablet (8 mg total) by mouth every evening. [name], DO Taking Active Self  ferrous sulfate 325 (65 FE) MG tablet [id number] Yes Take  325 mg by mouth 2 (two) times daily. [provider] Taking Active   fluticasone (FLONASE) 50 MCG/ACT nasal spray [id number] Yes Place 1 spray into both nostrils daily as needed for allergies or rhinitis. [provider] Taking Active Self  hydrALAZINE (APRESOLINE) 100 MG tablet [id number] Yes Take 100 mg by mouth 3 (three) times daily. [provider] Taking Active   insulin glargine (LANTUS) 100 unit/mL SOPN [id number] Yes Inject 10 Units into the skin at bedtime. [provider] Taking Active Self           Med Note [name], MELISSA R   Tue May 06, 2021  3:37 PM)    isosorbide mononitrate (IMDUR) 60 MG 24 hr tablet [id number] Yes Take 1 tablet (60 mg total) by mouth daily. Larey Dresser, MD Taking Active Self  loratadine (CLARITIN) 10 MG tablet [id number] Yes Take 10 mg by mouth daily. [provider] Taking Active Self  Multiple Vitamin (MULTIVITAMIN ADULT) TABS [id number] Yes Take 1 tablet by mouth daily. [provider] Taking Active Self  nitroGLYCERIN (NITROSTAT) 0.4 MG SL tablet [id number] Yes Place 1 tablet (0.4 mg total) under the tongue every 5 (five) minutes as needed for chest pain. [name], [name], MD Taking Active Self           Med Note Duffy Bruce, [name]   Tue Apr 29, 2021  9:30 PM)    NOVOLOG FLEXPEN 100 UNIT/ML FlexPen [id number] Yes Inject 8 Units into the skin 3 (three) times daily with meals. [provider] Taking Active Self           Med Note [name]   Tue May 06, 2021  5:01 PM)    Omega-3 Fatty Acids (FISH OIL) 1000 MG CAPS [id number] Yes Take 1,000 mg by mouth daily. [provider] Taking Active Self  oxyCODONE (ROXICODONE) 15 MG immediate release tablet [id number] Yes Take 1 tablet by mouth as needed. [provider] Taking Active   pantoprazole (PROTONIX) 40 MG tablet [id number] Yes Take 1 tablet (40 mg total) by mouth daily. [name], DO Taking Active Self  polyethylene  glycol (MIRALAX / GLYCOLAX) 17 g packet [id number] Yes Take 17 g by mouth daily. [name], [name], MD Taking Active Self  pregabalin (LYRICA) 25 MG capsule [id number] Yes Take 25 mg by mouth 2 (two) times daily. [provider] Taking Active Self  sertraline (ZOLOFT) 50 MG tablet [id number] Yes Take 50 mg by mouth daily. [provider] Taking Active Self  spironolactone (ALDACTONE) 25 MG tablet [id number] Yes Take 1 tablet (25 mg total) by mouth daily. [name], DO Taking Active Self  torsemide (DEMADEX) 20 MG tablet [id number] Yes Patient takes 1 tablet by mouth every other day as needed for fluid retention. [provider] Taking Active   traZODone (DESYREL) 50 MG tablet [id number] Yes Take 50 mg by mouth at bedtime. [provider] Taking  Active Self            Patient Active Problem List   Diagnosis Date Noted   OSA (obstructive sleep apnea) 05/27/2021   Pure hypercholesterolemia 05/27/2021   Chronic diastolic CHF (congestive heart failure) ([place]) 04/29/2021   Hypertensive urgency 04/14/2021   Acute on chronic diastolic CHF (congestive heart failure) ([place]) 04/14/2021   Chest pain 03/05/2021   CAD (coronary artery disease) 03/05/2021   T2DM (type 2 diabetes mellitus) ([place]) 03/05/2021   CKD (chronic kidney disease) stage 4, GFR 15-29 ml/min (HCC) 03/05/2021   Class 3 obesity ([place]) 03/05/2021   CVA (cerebral vascular accident) ([place]) 03/05/2021   Acute on chronic diastolic heart failure ([place]) 03/05/2021   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   AKI (acute kidney injury) ([place]) 03/05/2021    Conditions to be addressed/monitored per PCP order:   PCS and housing resources  Care Plan : [place]  Updates made by Ethelda Chick since 07/07/2021 12:00 AM     Problem: Knowledge Deficit and Care Coordination Needs Related to Management of HTN, HF, Type 2 DM   Priority: High     Long-Range Goal: Development of Plan of Care to  Address Knowledge Deficits and Care Coordination Needs related to  management of CHF, CAD, HTN, HLD, DMII, CKD Stage 4, and Depression   Start Date: 05/06/2021  Expected End Date: 09/08/2021  Priority: High  Note:   3Current Barriers:  Knowledge Deficits related to plan of care for management of CHF, CAD, HTN, HLD, DMII, CKD Stage 4, and Depression: depressed mood loss of energy/fatigue insomnia disturbed sleep decreased appetite difficulty concentrating recurrent thoughts of death  Care Coordination needs related to Housing barriers and Mental Health Concerns  and personal care servcies Patient says she spoke with [facility] transplant team social worker about kidney transplant in distant future, also spoke with [facility] counselor to address her depression, states she keeps all scheduled provider appointments  RNCM Clinical Goal(s):  Patient will demonstrate ongoing adherence to prescribed treatment plan for CHF, CAD, HTN, HLD, DMII, CKD Stage 4, and Depression as evidenced by patient report of improved health and quality of life and no unplanned ED or unplanned hospital admissions continue to work with RN Care Manager and/or Social Worker to address care management and care coordination needs related to CHF, CAD, HTN, HLD, DMII, CKD Stage 4, and Depression as evidenced by adherence to CM Team Scheduled appointments     work with pharmacist to address medication review and discuss ? medication side effect of chronic constipation and medication blister packs related to CHF, CAD, HTN, HLD, DMII, CKD Stage 4, and Depression as evidenced by review of EMR and patient or pharmacist report    through collaboration with RN Care manager, provider, and care team.  work with managed Medicaid LCSW to address mental health concerns related to the management of depression work with managed Medicaid BSW to address needs related to housing barriers as evidenced by patient receiving resources  from BSW for low income housing options in [place] and to apply for personal care services  Interventions: Inter-disciplinary care team collaboration (see longitudinal plan of care) Evaluation of current treatment plan related to  self management and patient's adherence to plan as established by provider Assessed status of low income housing and finding apartment     Heart Failure Interventions:  (Status: Goal on Track (progressing): YES.)  Long Term Goal - patient states her fluid retention has significantly improved,  says she cannot tolerate the compression stockings because they are too tight and make her legs hurt Assessed patient's heart failure management strategies  Diabetes:  (Status: Condition stable. Not addressed this visit.) Long Term Goal   Lab Results  Component Value Date   HGBA1C 6.7 (H) 04/29/2021  Assessed patient's understanding of A1C goal: <7% Reviewed medications with patient and discussed importance of medication adherence;        Counseled on importance of regular laboratory monitoring as prescribed;        Discussed plans with patient for ongoing care management follow up and provided patient with direct contact information for care management team;      Advised patient, providing education and rationale, to check cbg three times daily and record        call provider for findings outside established parameters;       Review of patient status, including review of consultants reports, relevant laboratory and other test results, and medications completed;        Hypertension: (Status: Goal on track: NO.) Long Term Goal- patient blood pressure continues to be very difficult to control due to orthostatic  hypotension episodes and elevated when sitting- patient is now working with cardiology pharmacist to transition to clonidine patch and manage BP Last practice recorded BP readings:   BP Readings from Last 3 Encounters:  07/01/21 (!) 160/74  05/27/21 (!) 163/68   05/26/21 (!) 158/78     Most recent eGFR/CrCl:  Lab Results  Component Value Date   EGFR 19 (L) 03/13/2021    No components found for: CRCL  Evaluation of current treatment plan related to hypertension self management and patient's adherence to plan as established by provider;   Reviewed medications with patient and discussed importance of compliance;  Discussed plans with patient for ongoing care management follow up and provided patient with direct contact information for care management team; Advised patient, providing education and rationale, to monitor blood pressure daily and record, calling PCP for findings outside established parameters;  Reviewed scheduled/upcoming provider appointments including: with cardiologist on 07/01/21  Interdisciplinary Collaboration:  (Status: Goal on Track (progressing): YES.) Long Term Goal  Collaborated with BSW to initiate plan of care to address needs related to Housing barriers, Inability to perform ADL's independently, and Inability to perform IADL's independently in patient with CHF, CAD, HTN, HLD, DM, CKD Stage 4, and Depression- messaged managed Medicaid BSW Mickel Fuchs that patient is interested in applying for [place] and having her daughter be her paid assistant Assessed status of patient's involvement with behavioral health counselor Collaboration with pharmacist for medication review, discussion of ? Medication side effects causing chronic constipation and arranging for patient to received home delivery of medications in blister packs Collaboration with Nolene Ebbs, MD regarding development and update of comprehensive plan of care as evidenced by provider attestation and co-signature Inter-disciplinary care team collaboration (see longitudinal plan of care)  Patient Goals/Self-Care Activities: Take medications as prescribed   Attend all scheduled provider appointments Call pharmacy for medication refills 3-7 days in  advance of running out of medications Attend church or other social activities Perform all self care activities independently  Call provider office for new concerns or questions  Work with the social worker to address care coordination needs and will continue to work with the clinical team to address health care and disease management related needs call office if I gain more than 2 pounds in one day or 5 pounds in one week keep  legs up while sitting track weight in diary use salt in moderation watch for swelling in feet, ankles and legs every day weigh myself daily know when to call the doctorfor fluid weight gain or consistently abnormal BP readings at home track symptoms and what helps feel better or worse check blood sugar at prescribed times: three times daily enter blood sugar readings and medication or insulin into daily log take the blood sugar log to all doctor visits take the blood sugar meter to all doctor visits eat fish at least once per week fill half of plate with vegetables manage portion size check blood pressure daily write blood pressure results in a log or diary learn about high blood pressure take blood pressure log to all doctor appointments call doctor for signs and symptoms of high blood pressure keep all doctor appointments take medications for blood pressure exactly as prescribed Limit fluid intake to 1 liter per day Collaborate with managed Medicaid team of LCSW, BSW and pharmacist to address identified needs Use [place] Management calendar to record weight , BP and CBGs so readings are all in one place       Follow up:  Patient agrees to Care Plan and Follow-up.  Plan: The Managed Medicaid care management team will reach out to the patient again over the next 45 days.  Date/time of next scheduled Social Work care management/care coordination outreach:  08/28/21  Mickel Fuchs, Arita Miss, [place] Medicaid Team  [phone]

## 2021-07-07 NOTE — Telephone Encounter (Signed)
Patient called.  Patient aware.  

## 2021-07-07 NOTE — Telephone Encounter (Signed)
-----   Message from Rafael Bihari, [place] sent at 07/07/2021  1:23 PM EST ----- SCr elevated above baseline. Stop spiro. Hold torsemide x 2 days, then resume at 20 mg every other day. Repeat BMET 1 week.   BP will likely be elevated with stopping spironolactone. Please increase Imdur to 90 mg daily. Continue to check BP at home and log.

## 2021-07-07 NOTE — Patient Instructions (Signed)
Visit Information  Maria Hudson was given information about Medicaid Managed Care team care coordination services as a part of their [facility] Medicaid benefit. Adeline Petitfrere verbally consented to engagement with the [facility] Managed Care team.   If you are experiencing a medical emergency, please call 911 or report to your local emergency department or urgent care.   If you have a non-emergency medical problem during routine business hours, please contact your provider's office and ask to speak with a nurse.   For questions related to your [facility] health plan, please call: [phone] or go here:[email or web address]  If you would like to schedule transportation through your [facility] plan, please call the following number at least 2 days in advance of your appointment: [phone].  Call the [place] at [phone], at any time, 24 hours a day, 7 days a week. If you are in danger or need immediate medical attention call 911.  If you would like help to quit smoking, call 1-800-QUIT-NOW (479)806-6161) OR Espaol: 1-855-Djelo-Ya (2-585-277-8242) o para ms informacin haga clic aqu or Text READY to 200-400 to register via text  Ms. Ferg - following are the goals we discussed in your visit today:   Goals Addressed   None      Social Worker will follow up with patient in 45 days .   Marland Kitchen Mickel Fuchs, BSW, [place]  High Risk Managed Medicaid Team  [phone]   Following is a copy of your plan of care:  Care Plan : [place]  Updates made by Ethelda Chick since 07/07/2021 12:00 AM     Problem: Knowledge Deficit and Care Coordination Needs Related to Management of HTN, HF, Type 2 DM   Priority: High     Long-Range Goal: Development of Plan of Care to Address Knowledge Deficits and Care Coordination Needs related to  management of CHF, CAD, HTN, HLD, DMII, CKD Stage 4, and  Depression   Start Date: 05/06/2021  Expected End Date: 09/08/2021  Priority: High  Note:   3Current Barriers:  Knowledge Deficits related to plan of care for management of CHF, CAD, HTN, HLD, DMII, CKD Stage 4, and Depression: depressed mood loss of energy/fatigue insomnia disturbed sleep decreased appetite difficulty concentrating recurrent thoughts of death  Care Coordination needs related to Housing barriers and [place] Concerns  and personal care servcies Patient says she spoke with [facility] transplant team social worker about kidney transplant in distant future, also spoke with [facility] counselor to address her depression, states she keeps all scheduled provider appointments  RNCM Clinical Goal(s):  Patient will demonstrate ongoing adherence to prescribed treatment plan for CHF, CAD, HTN, HLD, DMII, CKD Stage 4, and Depression as evidenced by patient report of improved health and quality of life and no unplanned ED or unplanned hospital admissions continue to work with RN Care Manager and/or Social Worker to address care management and care coordination needs related to CHF, CAD, HTN, HLD, DMII, CKD Stage 4, and Depression as evidenced by adherence to CM Team Scheduled appointments     work with pharmacist to address medication review and discuss ? medication side effect of chronic constipation and medication blister packs related to CHF, CAD, HTN, HLD, DMII, CKD Stage 4, and Depression as evidenced by review of EMR and patient or pharmacist report    through collaboration with RN Care manager, provider, and care team.  work with managed Medicaid  LCSW to address mental health concerns related to the management of depression work with managed Medicaid BSW to address needs related to housing barriers as evidenced by patient receiving resources from BSW for low income housing options in [place] and to apply for personal care  services  Interventions: Inter-disciplinary care team collaboration (see longitudinal plan of care) Evaluation of current treatment plan related to  self management and patient's adherence to plan as established by provider Assessed status of low income housing and finding apartment     Heart Failure Interventions:  (Status: Goal on Track (progressing): YES.)  Long Term Goal - patient states her fluid retention has significantly improved,  says she cannot tolerate the compression stockings because they are too tight and make her legs hurt Assessed patient's heart failure management strategies  Diabetes:  (Status: Condition stable. Not addressed this visit.) Long Term Goal   Lab Results  Component Value Date   HGBA1C 6.7 (H) 04/29/2021  Assessed patient's understanding of A1C goal: <7% Reviewed medications with patient and discussed importance of medication adherence;        Counseled on importance of regular laboratory monitoring as prescribed;        Discussed plans with patient for ongoing care management follow up and provided patient with direct contact information for care management team;      Advised patient, providing education and rationale, to check cbg three times daily and record        call provider for findings outside established parameters;       Review of patient status, including review of consultants reports, relevant laboratory and other test results, and medications completed;        Hypertension: (Status: Goal on track: NO.) Long Term Goal- patient blood pressure continues to be very difficult to control due to orthostatic  hypotension episodes and elevated when sitting- patient is now working with cardiology pharmacist to transition to clonidine patch and manage BP Last practice recorded BP readings:   BP Readings from Last 3 Encounters:  07/01/21 (!) 160/74  05/27/21 (!) 163/68  05/26/21 (!) 158/78     Most recent eGFR/CrCl:  Lab Results  Component Value Date    EGFR 19 (L) 03/13/2021    No components found for: CRCL  Evaluation of current treatment plan related to hypertension self management and patient's adherence to plan as established by provider;   Reviewed medications with patient and discussed importance of compliance;  Discussed plans with patient for ongoing care management follow up and provided patient with direct contact information for care management team; Advised patient, providing education and rationale, to monitor blood pressure daily and record, calling PCP for findings outside established parameters;  Reviewed scheduled/upcoming provider appointments including: with cardiologist on 07/01/21  Interdisciplinary Collaboration:  (Status: Goal on Track (progressing): YES.) Long Term Goal  Collaborated with BSW to initiate plan of care to address needs related to Housing barriers, Inability to perform ADL's independently, and Inability to perform IADL's independently in patient with CHF, CAD, HTN, HLD, DM, CKD Stage 4, and Depression- messaged managed Medicaid BSW Mickel Fuchs that patient is interested in applying for [place] and having her daughter be her paid assistant Assessed status of patient's involvement with behavioral health counselor Collaboration with pharmacist for medication review, discussion of ? Medication side effects causing chronic constipation and arranging for patient to received home delivery of medications in blister packs Collaboration with Nolene Ebbs, MD regarding development and update of comprehensive plan of care as  evidenced by provider attestation and co-signature Inter-disciplinary care team collaboration (see longitudinal plan of care)  Patient Goals/Self-Care Activities: Take medications as prescribed   Attend all scheduled provider appointments Call pharmacy for medication refills 3-7 days in advance of running out of medications Attend church or other social activities Perform all  self care activities independently  Call provider office for new concerns or questions  Work with the social worker to address care coordination needs and will continue to work with the clinical team to address health care and disease management related needs call office if I gain more than 2 pounds in one day or 5 pounds in one week keep legs up while sitting track weight in diary use salt in moderation watch for swelling in feet, ankles and legs every day weigh myself daily know when to call the doctorfor fluid weight gain or consistently abnormal BP readings at home track symptoms and what helps feel better or worse check blood sugar at prescribed times: three times daily enter blood sugar readings and medication or insulin into daily log take the blood sugar log to all doctor visits take the blood sugar meter to all doctor visits eat fish at least once per week fill half of plate with vegetables manage portion size check blood pressure daily write blood pressure results in a log or diary learn about high blood pressure take blood pressure log to all doctor appointments call doctor for signs and symptoms of high blood pressure keep all doctor appointments take medications for blood pressure exactly as prescribed Limit fluid intake to 1 liter per day Collaborate with managed Medicaid team of LCSW, BSW and pharmacist to address identified needs Use [place] Management calendar to record weight , BP and CBGs so readings are all in one place

## 2021-07-07 NOTE — Patient Instructions (Addendum)
Thank you for coming in today  Labs (BMET.Marland KitchenBNP) were done today, if any labs are abnormal the clinic will call you  Your physician recommends that you schedule a follow-up appointment in:  Please keep follow up appointment with Dr. Aundra Dubin in March  At the [facility], you and your health needs are our priority. As part of our continuing mission to provide you with exceptional heart care, we have created designated Provider Care Teams. These Care Teams include your primary Cardiologist (physician) and Advanced Practice Providers (APPs- Physician Assistants and Nurse Practitioners) who all work together to provide you with the care you need, when you need it.   You may see any of the following providers on your designated Care Team at your next follow up: Dr Glori Bickers Dr Haynes Kerns, NP Lyda Jester, [facility] [place], [place] Audry Riles, PharmD   Please be sure to bring in all your medications bottles to every appointment.   If you have any questions or concerns before your next appointment please send [place] a message through [place] or call our office at [phone].    TO LEAVE A MESSAGE FOR THE NURSE SELECT OPTION 2, PLEASE LEAVE A MESSAGE INCLUDING: YOUR NAME DATE OF BIRTH CALL BACK NUMBER REASON FOR CALL**this is important as we prioritize the call backs  YOU WILL RECEIVE A CALL BACK THE SAME DAY AS LONG AS YOU CALL BEFORE 4:00 PM

## 2021-07-09 ENCOUNTER — Ambulatory Visit: Payer: Medicaid Other | Admitting: Gastroenterology

## 2021-07-09 VITALS — BP 170/70 | HR 68 | Ht 63.5 in | Wt 222.2 lb

## 2021-07-09 DIAGNOSIS — R1013 Epigastric pain: Secondary | ICD-10-CM

## 2021-07-09 DIAGNOSIS — R0789 Other chest pain: Secondary | ICD-10-CM | POA: Diagnosis not present

## 2021-07-09 DIAGNOSIS — K5909 Other constipation: Secondary | ICD-10-CM

## 2021-07-09 NOTE — Patient Instructions (Signed)
If you are age 60 or older, your body mass index should be between 23-30. Your Body mass index is 38.75 kg/m. If this is out of the aforementioned range listed, please consider follow up with your Primary Care Provider.  If you are age 18 or younger, your body mass index should be between 19-25. Your Body mass index is 38.75 kg/m. If this is out of the aformentioned range listed, please consider follow up with your Primary Care Provider.   ________________________________________________________  The [place] [facility] providers would like to encourage you to use [facility] to communicate with providers for non-urgent requests or questions.  Due to long hold times on the telephone, sending your provider a message by [facility] may be a faster and more efficient way to get a response.  Please allow 48 business hours for a response.  Please remember that this is for non-urgent requests.  _______________________________________________________  Increase Miralax 1 capful twice a day if needed.  Doculax 10mg  suppositories as needed.   You have been scheduled for an abdominal ultrasound at [facility] (1st floor of hospital) on 07-15-2021 at 8:30 am . Please arrive 15 minutes prior to your appointment for registration. Make certain not to have anything to eat or drink 6 hours prior to your appointment. Should you need to reschedule your appointment, please contact radiology at [phone]. This test typically takes about 30 minutes to perform.   It was a pleasure to see you today!  Thank you for trusting me with your gastrointestinal care!

## 2021-07-09 NOTE — Progress Notes (Addendum)
[facility] Consult Note:  History: Maria Hudson 07/09/2021  Referring provider: Nolene Ebbs, MD  Reason for consult/chief complaint: Abdominal Pain (Intermittent epigastric/upper abd that radiates into the chest area x 2 months, sometimes feels like having a heart attack, hospital stay 04/29/21), Constipation (Constant hard stools, can go 3 weeks without having a BM but have been having a BM for the last 3 days), and Nausea (When having the bouts of pain)   Subjective  HPI:  This is a 61 year old woman referred to see [place] for multiple symptoms.   Review of multiple cardiology notes including hospitalizations and CHF clinic indicate hypertension that has been difficult to control as well as orthostatic hypotension, diastolic dysfunction with challenging volume management, mild pulmonary hypertension and CKD.  Patient underwent right heart catheterization 2 months ago, and was hospitalized in late October in mid November for cardiac condition.  She describes episodes of chest and upper abdominal pain that are of acute onset, nonexertional and sometimes starting in the epigastrium or left upper quadrant or in the left-sided chest and radiating either direction.  It does not clearly come on with meals and she occasionally gets heartburn, but not necessarily with these episodes.  PPI has been of little benefit, she denies dysphagia but has nausea during the episodes. One episode occurred while she was having a sleep study.  Maria Hudson describes chronic constipation, difficulty passing BMs in the stool may be large or firm.  Sometimes goes weeks without a good BM.  Sometimes she has to push on the perineum or disimpact herself.  She denies rectal bleeding, has taken MiraLAX as needed but not sure if that is really been helping.  ROS:  Review of Systems  Constitutional:  Positive for fatigue. Negative for appetite change and unexpected weight change.  HENT:  Negative for  mouth sores and voice change.   Eyes:  Negative for pain and redness.  Respiratory:  Positive for shortness of breath. Negative for cough.   Cardiovascular:  Negative for chest pain and palpitations.  Genitourinary:  Negative for dysuria and hematuria.  Musculoskeletal:  Negative for arthralgias and myalgias.  Skin:  Negative for pallor and rash.  Neurological:  Positive for dizziness. Negative for weakness and headaches.  Hematological:  Negative for adenopathy.    Past Medical History: Past Medical History:  Diagnosis Date   Acute on chronic diastolic heart failure ([place]) 03/05/2021   Anemia    Arthritis    CAD (coronary artery disease)    CHF (congestive heart failure) (HCC)    CKD (chronic kidney disease) stage 4, GFR 15-29 ml/min ([place]) 03/05/2021   Colon polyps    CVA (cerebral vascular accident) ([place])    Depression    Diabetes mellitus without complication ([place])    Hypertension    Hypertensive urgency 04/14/2021   OSA (obstructive sleep apnea) 05/27/2021   Pneumonia    Pure hypercholesterolemia 05/27/2021   Renal disorder    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 LHC showed 20% LAD, 50 to 60% left circumflex, and 100% RPDA stenosis with left-to-right collaterals. CAD managed medically. "  Admission history and physical April 29, 2021 1 patient admitted for chest pain, was hypertensive, no ischemic EKG changes, troponin negative.  Negative chest x-ray, no CT scan chest or abdomen   Maria Hudson says she sees a nephrologist and that it is likely she will eventually be on dialysis.  Past Surgical History: Past Surgical History:  Procedure Laterality Date   ABDOMINAL HYSTERECTOMY     CESAREAN SECTION     INNER EAR SURGERY Bilateral    RIGHT HEART CATH N/A 05/14/2021   Procedure: RIGHT HEART CATH;  Surgeon: Larey Dresser, MD;  Location: [facility];  Service: Cardiovascular;   Laterality: N/A;   TONSILLECTOMY     age 6   Possible screening colonoscopy 2016 and/or 2017 at [facility] (based on Epic encounters) - ? second one for poor prep on first  Family History: Family History  Problem Relation Age of Onset   Hypertension Mother    Stroke Mother    Diabetes Mother    Other Mother        enlarged heart   Hypertension Father    Hypertension Sister    Diabetes Sister    CAD Sister        enlarged heart   Kidney disease Sister    Diabetes Paternal Grandmother    Hypertension Daughter    Diabetes Daughter    Hypertension Son    Diabetes Son    Breast cancer Paternal Aunt     Social History: Social History   Socioeconomic History   Marital status: Divorced    Spouse name: Not on file   Number of children: 2   Years of education: Not on file   Highest education level: Some college, no degree  Occupational History   Occupation: retired   Occupation: disabled  Tobacco Use   Smoking status: Never    Passive exposure: Never   Smokeless tobacco: Never  Vaping Use   Vaping Use: Never used  Substance and Sexual Activity   Alcohol use: Not Currently   Drug use: Not Currently   Sexual activity: Not on file  Other Topics Concern   Not on file  Social History Narrative   Not on file   Social Determinants of Health   Financial Resource Strain: Low Risk    Difficulty of Paying Living Expenses: Not very hard  Food Insecurity: No Food Insecurity   Worried About Charity fundraiser in the Last Year: Never true   [place] in the Last Year: Never true  Transportation Needs: No Transportation Needs   Lack of Transportation (Medical): No   Lack of Transportation (Non-Medical): No  Physical Activity: Inactive   Days of Exercise per Week: 0 days   Minutes of Exercise per Session: 0 min  Stress: Stress Concern Present   Feeling of Stress : Very much  Social Connections: Moderately Isolated   Frequency of Communication with Friends and Family: More  than three times a week   Frequency of Social Gatherings with Friends and Family: More than three times a week   Attends Religious Services: More than 4 times per year   Active Member of [employer] or Organizations: No   Attends Archivist Meetings: Never   Marital Status: Divorced    Allergies: Allergies  Allergen Reactions   Hydrocodone Itching    Patient able to tolerate when taken with Benadryl.   Oxycodone-Acetaminophen Itching    Patient able to tolerate when taken with Benadryl.    Outpatient Meds: Current Outpatient Medications  Medication Sig Dispense Refill   acetaminophen (TYLENOL) 500 MG tablet Take 1,000 mg by mouth every 6 (six) hours as needed for moderate pain or headache.     allopurinol (ZYLOPRIM) 100 MG tablet Take 100 mg by mouth daily.  amLODipine (NORVASC) 10 MG tablet Take 10 mg by mouth every morning.     aspirin EC 81 MG tablet Take 81 mg by mouth daily. Swallow whole.     atorvastatin (LIPITOR) 40 MG tablet Take 1 tablet (40 mg total) by mouth every evening. 90 tablet 3   buPROPion (WELLBUTRIN) 100 MG tablet Take 100 mg by mouth 2 (two) times daily.     carvedilol (COREG) 25 MG tablet Take 1 tablet (25 mg total) by mouth 2 (two) times daily with a meal. 60 tablet 0   chlorthalidone (HYGROTON) 25 MG tablet Take 1 tablet (25 mg total) by mouth daily. 90 tablet 3   cloNIDine (CATAPRES - DOSED IN MG/24 HR) 0.3 mg/24hr patch Place 1 patch (0.3 mg total) onto the skin once a week. 4 patch 12   diclofenac Sodium (VOLTAREN) 1 % GEL Apply 2 g topically 4 (four) times daily. (Patient taking differently: Apply 2 g topically 2 (two) times daily as needed (pain).)     dicyclomine (BENTYL) 10 MG capsule Take 10 mg by mouth as needed.     doxazosin (CARDURA) 1 MG tablet Take 1 mg by mouth at bedtime.     ferrous sulfate 325 (65 FE) MG tablet Take 325 mg by mouth 2 (two) times daily.     fluticasone (FLONASE) 50 MCG/ACT nasal spray Place 1 spray into both nostrils  daily as needed for allergies or rhinitis.     hydrALAZINE (APRESOLINE) 100 MG tablet Take 100 mg by mouth 3 (three) times daily.     insulin glargine (LANTUS) 100 unit/mL SOPN Inject 10 Units into the skin at bedtime.     isosorbide mononitrate (IMDUR) 60 MG 24 hr tablet Take 1.5 tablets (90 mg total) by mouth daily. 135 tablet 3   loratadine (CLARITIN) 10 MG tablet Take 10 mg by mouth daily.     Multiple Vitamin (MULTIVITAMIN ADULT) TABS Take 1 tablet by mouth daily.     NOVOLOG FLEXPEN 100 UNIT/ML FlexPen Inject 8 Units into the skin 3 (three) times daily with meals.     Omega-3 Fatty Acids (FISH OIL) 1000 MG CAPS Take 1,000 mg by mouth daily.     oxyCODONE (ROXICODONE) 15 MG immediate release tablet Take 1 tablet by mouth as needed.     pantoprazole (PROTONIX) 40 MG tablet Take 1 tablet (40 mg total) by mouth daily. 30 tablet 0   polyethylene glycol (MIRALAX / GLYCOLAX) 17 g packet Take 17 g by mouth daily. 14 each 0   pregabalin (LYRICA) 25 MG capsule Take 25 mg by mouth 2 (two) times daily.     sertraline (ZOLOFT) 50 MG tablet Take 50 mg by mouth daily.     torsemide (DEMADEX) 20 MG tablet Take 1 tablet (20 mg total) by mouth every other day. Patient takes 1 tablet by mouth every other day as needed for fluid retention. 15 tablet 3   traZODone (DESYREL) 50 MG tablet Take 50 mg by mouth at bedtime.     Accu-Chek Softclix Lancets lancets 3 (three) times daily.     cloNIDine (CATAPRES) 0.3 MG tablet Take 0.3 mg by mouth 3 (three) times daily. (Patient not taking: Reported on 07/09/2021)     nitroGLYCERIN (NITROSTAT) 0.4 MG SL tablet Place 1 tablet (0.4 mg total) under the tongue every 5 (five) minutes as needed for chest pain. (Patient not taking: Reported on 07/09/2021) 20 tablet 0   No current facility-administered medications for this visit.   Discharge summary from  November recommends (among many other discharge meds), MiraLAX, 2 Senokot tablets daily and pantoprazole 40 mg  daily   ___________________________________________________________________ Objective   Exam:  BP (!) 170/70 (BP Location: Left Arm, Patient Position: Sitting, Cuff Size: Normal)    Pulse 68    Ht 5' 3.5" (1.613 m) Comment: height measured without shoes   Wt 222 lb 4 oz (100.8 kg)    BMI 38.75 kg/m  Wt Readings from Last 3 Encounters:  07/09/21 222 lb 4 oz (100.8 kg)  07/07/21 222 lb 3.2 oz (100.8 kg)  07/01/21 221 lb (100.2 kg)   Briefly lightheaded standing up.  Gets on exam table independently. General: Not acutely ill-appearing. Eyes: sclera anicteric, no redness ENT: oral mucosa moist without lesions, no cervical or supraclavicular lymphadenopathy CV: RRR without murmur, S1/S2, no JVD, no peripheral edema Resp: clear to auscultation bilaterally, normal RR and effort noted GI: soft, mild upper abdominal tenderness just left of midline, with active bowel sounds.  Difficult to assess mass or organomegaly due to morbid obesity.  No audible bruit Skin; warm and dry, no rash or jaundice noted Neuro: awake, alert and oriented x 3. Normal gross motor function and fluent speech Rectal exam (chaperoned by medical assistant Sophia): Normal perianal exam, diminished resting and voluntary sphincter tone, normal puborectalis contraction.  Copious firm stool rectal vault, no mass felt with some limitation due to retained stool.  While bearing down, abdominal muscles seem to contract, but she also had paradoxical anal sphincter contraction Labs:  CBC Latest Ref Rng & Units 05/14/2021 05/14/2021 05/05/2021  WBC 4.0 - 10.5 K/uL - - 4.3  Hemoglobin 12.0 - 15.0 g/dL 11.2(L) 10.5(L) 10.5(L)  Hematocrit 36.0 - 46.0 % 33.0(L) 31.0(L) 33.4(L)  Platelets 150 - 400 K/uL - - 213   CMP Latest Ref Rng & Units 07/07/2021 06/05/2021 05/26/2021  Glucose 70 - 99 mg/dL 171(H) 160(H) 151(H)  BUN 6 - 20 mg/dL 113(H) 78(H) 80(H)  Creatinine 0.44 - 1.00 mg/dL 4.45(H) 2.71(H) 2.63(H)  Sodium 135 - 145 mmol/L 135  139 140  Potassium 3.5 - 5.1 mmol/L 5.1 3.9 4.2  Chloride 98 - 111 mmol/L 103 109 109  CO2 22 - 32 mmol/L 21(L) 21(L) 21(L)  Calcium 8.9 - 10.3 mg/dL 9.3 9.2 8.7(L)  Total Protein 6.5 - 8.1 g/dL - - -  Total Bilirubin 0.3 - 1.2 mg/dL - - -  Alkaline Phos 38 - 126 U/L - - -  AST 15 - 41 U/L - - -  ALT 0 - 44 U/L - - -   Hepatic Function Latest Ref Rng & Units 04/30/2021 04/29/2021 04/20/2021  Total Protein 6.5 - 8.1 g/dL 6.3(L) 6.1(L) -  Albumin 3.5 - 5.0 g/dL 3.1(L) 3.1(L) 2.9(L)  AST 15 - 41 U/L 12(L) 13(L) -  ALT 0 - 44 U/L 8 9 -  Alk Phosphatase 38 - 126 U/L 45 44 -  Total Bilirubin 0.3 - 1.2 mg/dL 0.7 0.6 -  Bilirubin, Direct 0.0 - 0.2 mg/dL - <0.1 -   Last hemoglobin A1c = 6.7 on 04/29/2021  Radiologic Studies:  No abdominal imaging reports in [facility]  CLINICAL DATA:  generalized chest pain   EXAM: CHEST - 2 VIEW   COMPARISON:  04/14/2021   FINDINGS: Normal mediastinum and cardiac silhouette. Normal pulmonary vasculature. No evidence of effusion, infiltrate, or pneumothorax. No acute bony abnormality.   IMPRESSION: No acute cardiopulmonary process.     Electronically Signed   By: Suzy Bouchard M.D.   On: 04/29/2021  19:31   Assessment: Encounter Diagnoses  Name Primary?   Abdominal pain, epigastric Yes   Chronic constipation    Other chest pain    Medically complex patient with chronic chest/upper abdominal pain that is difficult to characterize.  It is not clear if it is digestive in nature, as it is atypical for GERD, gastritis, ulcer.  Possible biliary colic, though pain more toward left chest and upper abdomen Wonder if some of this may be related to poorly controlled hypertension.  Severe chronic constipation, likely significant contribution of 1 or more of her medicines (clonidine, diuretics, iron tablets)  Recommended Dulcolax 10 mg suppository every other day, start taking the MiraLAX 1 capful every day and increase to twice a day if  needed.  Abdominal ultrasound, mainly assess for gallstones or fluid collection.  If unrevealing, CT abdomen and chest with oral contrast only (no IV contrast due to elevated creatinine).  She may need upper endoscopy and colonoscopy for further work-up of the symptoms, though at present I am concerned about the increased sedation risks related to her poorly controlled hypertension and orthostatic hypotension as well as chance for electrolyte derangement with underlying CKD, and volume management with her diastolic dysfunction and reported history.  Will attempt to get report from her last colonoscopy at [facility] in 2017.  Thank you for the courtesy of this consult.  Please call me with any questions or concerns.  [name]  CC: Referring provider noted above ______________________  Record review addendum 07/16/21  Colonoscopy report at [facility] from 10/21/2015 could not be located.  There was a pathology report with the clinical information "screening for colon cancer, 1 to millimeter polyp in the sigmoid colon, removed with a cold forceps". This pathology report indicates "colonic mucosa with no significant pathologic diagnosis".  Therefore, it was not a true polyp tissue.  Madelon Lips, MD   _________________________   Additional record review addendum 08/08/2021  Colonoscopy report from [facility] clinic dated 10/18/2015 by Dr. Serena Colonel  Complete exam to cecum, good preparation, 2 mm sigmoid colon polyp (see above for pathology report).   Wilfrid Lund, MD    [facility]

## 2021-07-10 DIAGNOSIS — F32A Depression, unspecified: Secondary | ICD-10-CM | POA: Diagnosis not present

## 2021-07-10 DIAGNOSIS — I639 Cerebral infarction, unspecified: Secondary | ICD-10-CM | POA: Diagnosis not present

## 2021-07-10 DIAGNOSIS — N1832 Chronic kidney disease, stage 3b: Secondary | ICD-10-CM | POA: Diagnosis not present

## 2021-07-10 DIAGNOSIS — I2510 Atherosclerotic heart disease of native coronary artery without angina pectoris: Secondary | ICD-10-CM | POA: Diagnosis not present

## 2021-07-10 DIAGNOSIS — E871 Hypo-osmolality and hyponatremia: Secondary | ICD-10-CM | POA: Diagnosis not present

## 2021-07-10 DIAGNOSIS — I5030 Unspecified diastolic (congestive) heart failure: Secondary | ICD-10-CM | POA: Diagnosis not present

## 2021-07-10 DIAGNOSIS — I129 Hypertensive chronic kidney disease with stage 1 through stage 4 chronic kidney disease, or unspecified chronic kidney disease: Secondary | ICD-10-CM | POA: Diagnosis not present

## 2021-07-10 DIAGNOSIS — D631 Anemia in chronic kidney disease: Secondary | ICD-10-CM | POA: Diagnosis not present

## 2021-07-15 ENCOUNTER — Other Ambulatory Visit: Payer: Self-pay

## 2021-07-15 ENCOUNTER — Ambulatory Visit (HOSPITAL_COMMUNITY)
Admission: RE | Admit: 2021-07-15 | Discharge: 2021-07-15 | Disposition: A | Discharge status: Home / Self Care | Payer: Medicaid Other | Source: Ambulatory Visit | Attending: Gastroenterology | Admitting: Gastroenterology

## 2021-07-15 DIAGNOSIS — R1013 Epigastric pain: Secondary | ICD-10-CM | POA: Insufficient documentation

## 2021-07-15 DIAGNOSIS — K760 Fatty (change of) liver, not elsewhere classified: Secondary | ICD-10-CM | POA: Diagnosis not present

## 2021-07-15 DIAGNOSIS — K828 Other specified diseases of gallbladder: Secondary | ICD-10-CM | POA: Diagnosis not present

## 2021-07-16 ENCOUNTER — Telehealth (HOSPITAL_COMMUNITY): Payer: Self-pay

## 2021-07-16 ENCOUNTER — Ambulatory Visit (HOSPITAL_COMMUNITY)
Admission: RE | Admit: 2021-07-16 | Discharge: 2021-07-16 | Disposition: A | Discharge status: Home / Self Care | Payer: Medicaid Other | Source: Ambulatory Visit | Attending: Cardiology | Admitting: Cardiology

## 2021-07-16 DIAGNOSIS — Z419 Encounter for procedure for purposes other than remedying health state, unspecified: Secondary | ICD-10-CM | POA: Diagnosis not present

## 2021-07-16 DIAGNOSIS — I5032 Chronic diastolic (congestive) heart failure: Secondary | ICD-10-CM | POA: Insufficient documentation

## 2021-07-16 LAB — BASIC METABOLIC PANEL
Anion gap: 12 (ref 5–15)
BUN: 74 mg/dL — ABNORMAL HIGH (ref 6–20)
CO2: 23 mmol/L (ref 22–32)
Calcium: 9.2 mg/dL (ref 8.9–10.3)
Chloride: 104 mmol/L (ref 98–111)
Creatinine, Ser: 3.31 mg/dL — ABNORMAL HIGH (ref 0.44–1.00)
GFR, Estimated: 15 mL/min — ABNORMAL LOW (ref >60)
Glucose, Bld: 135 mg/dL — ABNORMAL HIGH (ref 70–99)
Potassium: 3.7 mmol/L (ref 3.5–5.1)
Sodium: 139 mmol/L (ref 135–145)

## 2021-07-16 NOTE — Telephone Encounter (Addendum)
Pt aware, agreeable, and verbalized understanding   ----- Message from Rafael Bihari, FNP sent at 07/16/2021 12:41 PM EST ----- Kidney function back to baseline. Please remain off spiro and continue torsemide 20 mg every other day. Continue to check BP and keep follow up with HTN clinic.

## 2021-07-17 ENCOUNTER — Ambulatory Visit: Payer: Medicaid Other

## 2021-07-18 ENCOUNTER — Other Ambulatory Visit: Payer: Self-pay

## 2021-07-21 ENCOUNTER — Other Ambulatory Visit: Payer: Self-pay

## 2021-07-22 ENCOUNTER — Telehealth (HOSPITAL_BASED_OUTPATIENT_CLINIC_OR_DEPARTMENT_OTHER): Payer: Self-pay | Admitting: Cardiovascular Disease

## 2021-07-22 NOTE — Telephone Encounter (Signed)
Received fax from [place] requesting refills for Doxazosin 8 mg. This is not listed on pt medication list, however it is on med lists documented in last 3 OV notes. It is also mentioned in OV notes to continue taking Doxazosin 8 mg daily.   Doxazosin 1 mg is listed on pt medication list and it was documented on 07/18/21 that pt no longer takes this.  Routing to PharmD to advise, last seen by [place], [facility].

## 2021-07-22 NOTE — Telephone Encounter (Signed)
Spoke with patient - she was told to d/c doxazosin by her GI MD - was having some GI issues and thought this may have been contributing.  Patient did not ask for refill, was sent by [facility]

## 2021-07-24 ENCOUNTER — Encounter (HOSPITAL_COMMUNITY): Payer: Self-pay | Admitting: Emergency Medicine

## 2021-07-24 ENCOUNTER — Ambulatory Visit: Payer: Medicaid Other

## 2021-07-24 ENCOUNTER — Other Ambulatory Visit: Payer: Self-pay

## 2021-07-24 ENCOUNTER — Ambulatory Visit (INDEPENDENT_AMBULATORY_CARE_PROVIDER_SITE_OTHER): Payer: Medicaid Other

## 2021-07-24 ENCOUNTER — Ambulatory Visit (HOSPITAL_COMMUNITY)
Admission: EM | Admit: 2021-07-24 | Discharge: 2021-07-24 | Disposition: A | Discharge status: Home / Self Care | Payer: Medicaid Other | Attending: Urgent Care | Admitting: Urgent Care

## 2021-07-24 DIAGNOSIS — M25561 Pain in right knee: Secondary | ICD-10-CM | POA: Diagnosis not present

## 2021-07-24 DIAGNOSIS — I10 Essential (primary) hypertension: Secondary | ICD-10-CM | POA: Diagnosis not present

## 2021-07-24 DIAGNOSIS — N186 End stage renal disease: Secondary | ICD-10-CM

## 2021-07-24 DIAGNOSIS — M7989 Other specified soft tissue disorders: Secondary | ICD-10-CM | POA: Diagnosis not present

## 2021-07-24 DIAGNOSIS — W19XXXA Unspecified fall, initial encounter: Secondary | ICD-10-CM

## 2021-07-24 NOTE — Discharge Instructions (Signed)
Your xray is concerning for a possible patellar fracture. CT scan was recommended for further imaging. Please wear the knee immobilizer at all times.  Call orthopedics today to schedule a follow up next week. Continue taking tylenol (up to 3,000mg  daily max)  and topical voltaren gel (2g four times daily max). May also continue lidocaine patches.

## 2021-07-24 NOTE — ED Triage Notes (Signed)
Patient fell on Tuesday night. Patient tripped over a book bag.  Patient fell forward and on right side.  Chest soreness with movement, breathing, laughing.  Left wrist soreness.  Patient able to move wrist without difficulty.  And right knee pain, sitting still, no pain in knee, movement causes pain.

## 2021-07-24 NOTE — ED Provider Notes (Signed)
[place]    CSN: [id number] Arrival date & time: 07/24/21  1108      History   Chief Complaint Chief Complaint  Patient presents with   Fall    HPI Maria Hudson is a 61 y.o. female.   Pleasant 61 year old female presents today after sustaining a fall at home on Tuesday evening.  She states she was in the kitchen and tripped on her granddaughter's book bag on the floor.  She fell forwards catching herself on her wrists.  She states since the fall, she has been sore all over, but her area of primary concern is her right knee.  She states there is minimal pain when sitting down and knee bent to 90 degrees, but any extension of her knee causes pain.  Weightbearing walking and going up stairs causes further pain.  She has been taking Tylenol and using a topical lidocaine patch with mild relief.  She denies a history of fractures in the past, denies osteoporosis.  She is a diabetic but states her blood sugar has been well controlled.  She states she had mild wrist pain after the fall but that has improved and her wrist is fully mobile.  She states her chest wall hit the floor, and was mildly uncomfortable as well, but denies point tenderness to rib, denies chest pain, swelling, bruising, or shortness of breath or cough.  She states her chin hit the floor as well, but denies any issues with range of motion or chewing.  She denies any headache.    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   Sleep apnea    Stroke [facility])     Patient Active Problem List   Diagnosis Date Noted   OSA (obstructive sleep apnea) 05/27/2021   Pure hypercholesterolemia 05/27/2021   Chronic diastolic CHF (congestive heart failure) ([place]) 04/29/2021   Hypertensive urgency 04/14/2021   Acute on chronic diastolic CHF (congestive heart failure) ([place]) 04/14/2021   Chest pain 03/05/2021   CAD (coronary artery disease) 03/05/2021   T2DM (type 2 diabetes mellitus) ([place]) 03/05/2021   CKD (chronic kidney disease) stage 4, GFR 15-29 ml/min ([place]) 03/05/2021   Class 3 obesity ([place]) 03/05/2021   CVA (cerebral vascular accident) ([place]) 03/05/2021   Acute on chronic diastolic heart failure ([place]) 03/05/2021   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   AKI (acute kidney injury) ([place]) 03/05/2021    Past Surgical History:  Procedure Laterality Date   ABDOMINAL HYSTERECTOMY     CESAREAN SECTION     INNER EAR SURGERY Bilateral    RIGHT HEART CATH N/A 05/14/2021   Procedure: RIGHT HEART CATH;  Surgeon: Larey Dresser, MD;  Location: [place] [facility];  Service: Cardiovascular;  Laterality: N/A;   TONSILLECTOMY     age 18    OB History   No obstetric history on file.  Home Medications    Prior to Admission medications   Medication Sig Start Date End Date Taking? Authorizing Provider  Accu-Chek Softclix Lancets lancets 3 (three) times daily. 06/26/21   [provider]  acetaminophen (TYLENOL) 500 MG tablet Take 1,000 mg by mouth every 6 (six) hours as needed for moderate pain or headache.    [provider]  allopurinol (ZYLOPRIM) 100 MG tablet Take 100 mg by mouth daily. 01/27/21   [provider]  amLODipine (NORVASC) 10 MG tablet Take 10 mg by mouth every morning. 02/03/21   [provider]  aspirin EC 81 MG tablet Take 81 mg by mouth daily. Swallow whole.    [provider]  atorvastatin (LIPITOR)  40 MG tablet Take 1 tablet (40 mg total) by mouth every evening. 05/27/21   Skeet Latch, MD  buPROPion (WELLBUTRIN) 100 MG tablet Take 100 mg by mouth 2 (two) times daily. 02/04/21   [provider]  carvedilol (COREG) 25 MG tablet Take 1 tablet (25 mg total) by mouth 2 (two) times daily with a meal. 03/07/21 05/05/22  [name], [name], MD  chlorthalidone (HYGROTON) 25 MG tablet Take 1 tablet (25 mg total) by mouth daily. 07/01/21 09/29/21  Skeet Latch, MD  cloNIDine (CATAPRES - DOSED IN MG/24 HR) 0.3 mg/24hr patch Place 1 patch (0.3 mg total) onto the skin once a week. 07/01/21   Skeet Latch, MD  cloNIDine (CATAPRES) 0.3 MG tablet Take 0.3 mg by mouth 3 (three) times daily. Patient not taking: Reported on 07/18/2021 07/03/21   [provider]  diclofenac Sodium (VOLTAREN) 1 % GEL Apply 2 g topically 4 (four) times daily. Patient taking differently: Apply 2 g topically 2 (two) times daily as needed (pain). 05/03/21   [name], DO  dicyclomine (BENTYL) 10 MG capsule Take 10 mg by mouth as needed. 06/02/21   [provider]  doxazosin (CARDURA) 1 MG tablet Take 1 mg by mouth at bedtime. Patient not taking: Reported on 07/18/2021 07/01/21   [provider]  ferrous sulfate 325 (65 FE) MG tablet Take 325 mg by mouth 2 (two) times daily. 05/31/21   [provider]  fluticasone (FLONASE) 50 MCG/ACT nasal spray Place 1 spray into both nostrils daily as needed for allergies or rhinitis.    [provider]  hydrALAZINE (APRESOLINE) 100 MG tablet Take 100 mg by mouth 3 (three) times daily. 05/28/21   [provider]  insulin glargine (LANTUS) 100 unit/mL SOPN Inject 10 Units into the skin at bedtime.    [provider]  isosorbide mononitrate (IMDUR) 60 MG 24 hr tablet Take 1.5 tablets (90 mg total) by mouth daily. 07/07/21   Milford, Maricela Bo, FNP  loratadine (CLARITIN) 10 MG tablet Take 10 mg by mouth daily. 02/14/21    [provider]  Multiple Vitamin (MULTIVITAMIN ADULT) TABS Take 1 tablet by mouth daily.    [provider]  nitroGLYCERIN (NITROSTAT) 0.4 MG SL tablet Place 1 tablet (0.4 mg total) under the tongue every 5 (five) minutes as needed for chest pain. Patient not taking: Reported on 07/09/2021 03/07/21   [name], [name], MD  NOVOLOG FLEXPEN 100 UNIT/ML FlexPen Inject 8 Units into the skin 3 (three) times daily with meals. Patient not taking: Reported on 07/18/2021 01/12/21   [provider]  Omega-3 Fatty Acids (FISH OIL) 1000 MG CAPS Take 1,000 mg by mouth daily.    [provider]  oxyCODONE (ROXICODONE) 15 MG immediate release tablet Take  1 tablet by mouth as needed.    [provider]  pantoprazole (PROTONIX) 40 MG tablet Take 1 tablet (40 mg total) by mouth daily. 04/20/21   [name], DO  polyethylene glycol (MIRALAX / GLYCOLAX) 17 g packet Take 17 g by mouth daily. 03/07/21   [name], [name], MD  pregabalin (LYRICA) 25 MG capsule Take 25 mg by mouth 2 (two) times daily. 02/03/21   [provider]  sertraline (ZOLOFT) 50 MG tablet Take 50 mg by mouth daily. 02/01/21   [provider]  torsemide (DEMADEX) 20 MG tablet Take 1 tablet (20 mg total) by mouth every other day. Patient takes 1 tablet by mouth every other day as needed for fluid retention. 07/07/21   Rafael Bihari, FNP  traZODone (DESYREL) 50 MG tablet Take 50 mg by mouth at bedtime. 04/23/21   [provider]    Family History Family History  Problem Relation Age of Onset   Hypertension Mother    Stroke Mother    Diabetes Mother    Other Mother        enlarged heart   Hypertension Father    Hypertension Sister    Diabetes Sister    CAD Sister        enlarged heart   Kidney disease Sister    Diabetes Paternal Grandmother    Hypertension Daughter    Diabetes Daughter    Hypertension Son    Diabetes Son    Breast cancer Paternal Aunt      Social History Social History   Tobacco Use   Smoking status: Never    Passive exposure: Never   Smokeless tobacco: Never  Vaping Use   Vaping Use: Never used  Substance Use Topics   Alcohol use: Not Currently   Drug use: Not Currently     Allergies   Hydrocodone and Oxycodone-acetaminophen   Review of Systems Review of Systems  Musculoskeletal:  Positive for arthralgias (R knee) and joint swelling (R knee).  All other systems reviewed and are negative.   Physical Exam Triage Vital Signs ED Triage Vitals  Enc Vitals Group     BP 07/24/21 1318 (!) 181/70     Pulse Rate 07/24/21 1318 (!) 57     Resp 07/24/21 1318 20     Temp 07/24/21 1318 98.1 F (36.7 C)     Temp Source 07/24/21 1318 Oral     SpO2 07/24/21 1318 100 %     Weight --      Height --      Head Circumference --      Peak Flow --      Pain Score 07/24/21 1314 8     Pain Loc --      Pain Edu? --      Excl. in [place]? --    No data found.  Updated Vital Signs BP (!) 181/70 (BP Location: Right Arm)    Pulse (!) 57    Temp 98.1 F (36.7 C) (Oral)    Resp 20    SpO2 100%   Visual Acuity Right Eye Distance:   Left Eye Distance:   Bilateral Distance:    Right Eye Near:   Left Eye Near:    Bilateral Near:     Physical Exam Vitals and nursing note reviewed.  Constitutional:      General: She is not in acute distress.    Appearance: She is well-developed. She is obese. She is not ill-appearing, toxic-appearing or  diaphoretic.  HENT:     Head: Normocephalic and atraumatic. No raccoon eyes, Battle's sign, abrasion, contusion, masses, right periorbital erythema, left periorbital erythema or laceration.     Jaw: There is normal jaw occlusion. No trismus, tenderness, swelling, pain on movement or malocclusion.     Right Ear: Tympanic membrane, ear canal and external ear normal.     Left Ear: Tympanic membrane, ear canal and external ear normal.  Eyes:     Conjunctiva/sclera: Conjunctivae normal.   Cardiovascular:     Rate and Rhythm: Normal rate and regular rhythm.     Pulses: Normal pulses.     Heart sounds: Murmur heard.  Pulmonary:     Effort: Pulmonary effort is normal. No respiratory distress.     Breath sounds: Normal breath sounds. No stridor. No wheezing, rhonchi or rales.  Chest:     Chest wall: No tenderness.  Abdominal:     Palpations: Abdomen is soft.     Tenderness: There is no abdominal tenderness.  Musculoskeletal:        General: Swelling (moderate joint effusion noted to R knee) and tenderness (to palpation of patella on R, inferior patellar ligament) present.     Cervical back: Neck supple.  Skin:    General: Skin is warm and dry.     Capillary Refill: Capillary refill takes less than 2 seconds.     Findings: No bruising, erythema or rash.  Neurological:     General: No focal deficit present.     Mental Status: She is alert.     Comments: Pt in wheelchair, not ambulating in clinic due to knee pain  Psychiatric:        Mood and Affect: Mood normal.     UC Treatments / Results  Labs (all labs ordered are listed, but only abnormal results are displayed) Labs Reviewed - No data to display  EKG   Radiology DG Knee Complete 4 Views Right  Result Date: 07/24/2021 CLINICAL DATA:  fall, pain in R knee, swelling, cannot bear weight or walk up/down stairs EXAM: RIGHT KNEE - COMPLETE 4+ VIEW COMPARISON:  None. FINDINGS: Bipartite patella versus is possible patellar fracture. There is infrapatellar of the enthesophyte with linear lucency at its base. There is prepatellar soft tissue swelling and a small to moderate-sized joint effusion. Vascular calcifications. IMPRESSION: Bipartite patella versus patellar fracture and possible fracture at the base of an infrapatellar enthesophyte. There is prepatellar soft tissue swelling and a small to moderate-sized joint effusion. Consider CT. Electronically Signed   By: Maurine Simmering M.D.   On: 07/24/2021 14:15     Procedures Procedures (including critical care time)  Medications Ordered in UC Medications - No data to display  Initial Impression / Assessment and Plan / UC Course  I have reviewed the triage vital signs and the nursing notes.  Pertinent labs & imaging results that were available during my care of the patient were reviewed by me and considered in my medical decision making (see chart for details).     Knee pain - xray findings state bipartite patella vs patellar fracture. Given pt's knee pain, inability to fully extend, and significant soft tissue swelling following a fall, will place pt in knee immobilizer and have her follow up with ortho for advanced imaging if warranted. Pt states she cannot take PO NSAIDs due to her kidneys. Last creatinine on 07/16/21 was 3.31, GFR 15. Pts current pain level 3/10 therefore supportive measures appropriate, no narcotics warranted Fall - other  areas assessed in detail, no swelling, bruising or tenderness. No further xrays needed at present time ESRD - NSAIDs contraindicated due to end stage renal disease HTN - pts pain only at 3/10. Recommended monitoring BP and following up with PCP should it remain elevated.   Final Clinical Impressions(s) / UC Diagnoses   Final diagnoses:  Knee pain, right anterior  Fall, initial encounter  ESRD (end stage renal disease) ([place])  Essential hypertension     Discharge Instructions      Your xray is concerning for a possible patellar fracture. CT scan was recommended for further imaging. Please wear the knee immobilizer at all times.  Call orthopedics today to schedule a follow up next week. Continue taking tylenol (up to 3,000mg  daily max)  and topical voltaren gel (2g four times daily max). May also continue lidocaine patches.      ED Prescriptions   None    PDMP not reviewed this encounter.   Chaney Malling, [place] 07/24/21 1442

## 2021-07-25 DIAGNOSIS — M25561 Pain in right knee: Secondary | ICD-10-CM | POA: Diagnosis not present

## 2021-07-28 ENCOUNTER — Other Ambulatory Visit: Payer: Self-pay

## 2021-07-28 ENCOUNTER — Ambulatory Visit
Admission: RE | Admit: 2021-07-28 | Discharge: 2021-07-28 | Disposition: A | Discharge status: Home / Self Care | Payer: Medicaid Other | Source: Ambulatory Visit | Attending: Family Medicine | Admitting: Family Medicine

## 2021-07-28 ENCOUNTER — Other Ambulatory Visit: Payer: Self-pay | Admitting: Family Medicine

## 2021-07-28 DIAGNOSIS — M25561 Pain in right knee: Secondary | ICD-10-CM

## 2021-07-28 DIAGNOSIS — S82001A Unspecified fracture of right patella, initial encounter for closed fracture: Secondary | ICD-10-CM | POA: Diagnosis not present

## 2021-07-28 DIAGNOSIS — M25461 Effusion, right knee: Secondary | ICD-10-CM | POA: Diagnosis not present

## 2021-07-28 DIAGNOSIS — M7989 Other specified soft tissue disorders: Secondary | ICD-10-CM | POA: Diagnosis not present

## 2021-07-28 DIAGNOSIS — M1711 Unilateral primary osteoarthritis, right knee: Secondary | ICD-10-CM | POA: Diagnosis not present

## 2021-07-29 ENCOUNTER — Other Ambulatory Visit: Payer: Self-pay | Admitting: *Deleted

## 2021-07-29 ENCOUNTER — Other Ambulatory Visit: Payer: Self-pay

## 2021-07-29 DIAGNOSIS — R1013 Epigastric pain: Secondary | ICD-10-CM

## 2021-07-29 DIAGNOSIS — K5909 Other constipation: Secondary | ICD-10-CM

## 2021-07-29 DIAGNOSIS — R110 Nausea: Secondary | ICD-10-CM

## 2021-07-30 NOTE — Patient Instructions (Signed)
Visit Information  [name] was given information about Medicaid Managed Care team care coordination services as a part of their [facility] Medicaid benefit. Maria Hudson verbally consented to engagement with the [facility] Managed Care team.   If you are experiencing a medical emergency, please call 911 or report to your local emergency department or urgent care.   If you have a non-emergency medical problem during routine business hours, please contact your provider's office and ask to speak with a nurse.   For questions related to your [facility] health plan, please call: [phone] or go here:[email or web address]  If you would like to schedule transportation through your [facility] plan, please call the following number at least 2 days in advance of your appointment: [phone].  Call [place] at [phone], at any time, 24 hours a day, 7 days a week. If you are in danger or need immediate medical attention call 911.  If you would like help to quit smoking, call 1-800-QUIT-NOW (343)826-1120) OR Espaol: 1-855-Djelo-Ya (3-154-008-6761) o para ms informacin haga clic aqu or Text READY to 200-400 to register via text  [name] - following are the goals we discussed in your visit today:   Goals Addressed   Take medications as prescribed   Attend all scheduled provider appointments Call pharmacy for medication refills 3-7 days in advance of running out of medications Attend church or other social activities Perform all self care activities independently  Call provider office for new concerns or questions  Work with the social worker to address care coordination needs and will continue to work with the clinical team to address health care and disease management related needs call office if I gain more than 2 pounds in one day or 5 pounds in one week keep legs up while sitting track weight in diary use salt in moderation watch for  swelling in feet, ankles and legs every day weigh myself daily know when to call the doctorfor fluid weight gain or consistently abnormal BP readings at home track symptoms and what helps feel better or worse check blood sugar at prescribed times: three times daily enter blood sugar readings and medication or insulin into daily log take the blood sugar log to all doctor visits take the blood sugar meter to all doctor visits eat fish at least once per week fill half of plate with vegetables manage portion size check blood pressure daily write blood pressure results in a log or diary learn about high blood pressure take blood pressure log to all doctor appointments call doctor for signs and symptoms of high blood pressure keep all doctor appointments take medications for blood pressure exactly as prescribed Limit fluid intake to 1 liter per day Collaborate with managed Medicaid team of LCSW, BSW and pharmacist to address identified needs Use [place] Management calendar to record weight , BP and CBGs so readings are all in one place Discuss "cold flashes" with diaphoresis with Dr. Jeanie Cooks on 08/04/21   Patient verbalizes understanding of instructions and care plan provided today and agrees to view in [place]. Active MyChart status confirmed with patient.    The Managed Medicaid care management team will reach out to the patient again over the next 30 days.   [name], [name], [place] Management Coordinator - Managed [place] High Risk [phone]   Following is a copy of your plan of care:  Care Plan : [place]  Care  Updates made by [name], RN since 07/30/2021 12:00 AM     Problem: Knowledge Deficit and Care Coordination Needs Related to Management of HTN, HF, Type 2 DM   Priority: High     Long-Range Goal: Development of Plan of Care to Address Knowledge Deficits and Care Coordination  Needs related to  management of CHF, CAD, HTN, HLD, DMII, CKD Stage 4, and Depression   Start Date: 05/06/2021  Expected End Date: 05/06/2022  Priority: High  Note:   Current Barriers:  Knowledge Deficits related to plan of care for management of CHF, CAD, HTN, HLD, DMII, CKD Stage 4, and Depression: depressed mood loss of energy/fatigue insomnia disturbed sleep decreased appetite difficulty concentrating recurrent thoughts of death  Care Coordination needs related to Housing barriers and [place] Concerns  and personal care services Patient says she fell on 2/7 at home after she tripped on her granddaughter's book bag and fractured her right knee cap , says she is wearing a knee immobilizer and trying to keep her leg elevated when she sits, she is managing the pain with rest. Says she will see an orthopedist in the near future. Says she saw gastroenterologist on 1/25 for intermittent right chest, abdominal pain and chronic constipation and had abdominal ultrasound without acute findings , she says she is successfully treating her chronic constipation with moringu, She c/o intermittent episodes of feeling very cold with sweating and is wondering if these symptoms may be due to menopause, says she had uterus and one ovary removed years ago RNCM Clinical Goal(s):  Patient will demonstrate ongoing adherence to prescribed treatment plan for CHF, CAD, HTN, HLD, DMII, CKD Stage 4, and Depression as evidenced by patient report of improved health and quality of life and no unplanned ED or unplanned hospital admissions continue to work with RN Care Manager and/or Social Worker to address care management and care coordination needs related to CHF, CAD, HTN, HLD, DMII, CKD Stage 4, and Depression as evidenced by adherence to CM Team Scheduled appointments      work with managed Medicaid BSW to address needs related to housing barriers as evidenced by patient receiving resources from [employer] for low income  housing options in [place] and to apply for personal care services- patient says she was told by the managed Medicaid BSW that she does not qualify for PCS or CAP  Interventions: Inter-disciplinary care team collaboration (see longitudinal plan of care) Evaluation of current treatment plan related to  self management and patient's adherence to plan as established by provider Assessed status of low income housing and finding apartment     Heart Failure Interventions:  (Status: Goal on Track (progressing): YES.)  Long Term Goal - patient states her fluid retention has significantly improved,  says she cannot tolerate the compression stockings because they are too tight and make her legs hurt Assessed patient's heart failure management strategies  Diabetes:  (Status: Goal on Track (progressing): YES.) Long Term Goal - patient says her CBGS have been quite variable and she is not sure why, denies hypoglycemia or sustained hyperglycemia  Lab Results  Component Value Date   HGBA1C 6.7 (H) 04/29/2021  Assessed patient's understanding of A1C goal: <7% Reviewed medications with patient and discussed importance of medication adherence;        Counseled on importance of regular laboratory monitoring as prescribed;        Discussed plans with patient for ongoing care management follow up and provided patient with direct  contact information for care management team;      Advised patient, providing education and rationale, to check cbg three times daily and record        call provider for findings outside established parameters;       Review of patient status, including review of consultants reports, relevant laboratory and other test results, and medications completed;        Hypertension: (Status: Goal on track: NO.) Long Term Goal- patient blood pressure continues to be very difficult to control due to orthostatic  hypotension episodes and elevated when sitting- patient is now working with cardiology  pharmacist to assist with management Last practice recorded BP readings:   BP Readings from Last 3 Encounters:  07/24/21 (!) 181/70  07/09/21 (!) 170/70  07/07/21 (!) 168/80   Most recent eGFR/CrCl:  Lab Results  Component Value Date   EGFR 19 (L) 03/13/2021    No components found for: CRCL  Evaluation of current treatment plan related to hypertension self management and patient's adherence to plan as established by provider;   Reviewed medications with patient and discussed importance of compliance;  Discussed plans with patient for ongoing care management follow up and provided patient with direct contact information for care management team; Advised patient, providing education and rationale, to monitor blood pressure daily and record, calling PCP for findings outside established parameters;  Reviewed scheduled/upcoming provider appointments including: with Dr. Jeanie Cooks on 08/04/21  Interdisciplinary Collaboration:  (Status: Goal on Track (progressing): YES.) Long Term Goal  Collaborated with BSW to initiate plan of care to address needs related to Housing barriers, Inability to perform ADL's independently, and Inability to perform IADL's independently in patient with CHF, CAD, HTN, HLD, DM, CKD Stage 4, and Depression Assessed status of patient's involvement with behavioral health counselor Collaboration with pharmacist for medication review, discussion of ? Medication side effects causing chronic constipation and arranging for patient to received home delivery of medications in blister packs Collaboration with Nolene Ebbs, MD regarding development and update of comprehensive plan of care as evidenced by provider attestation and co-signature Inter-disciplinary care team collaboration (see longitudinal plan of care)  Patient Goals/Self-Care Activities: Take medications as prescribed   Attend all scheduled provider appointments Call pharmacy for medication refills 3-7 days in advance  of running out of medications Attend church or other social activities Perform all self care activities independently  Call provider office for new concerns or questions  Work with the social worker to address care coordination needs and will continue to work with the clinical team to address health care and disease management related needs call office if I gain more than 2 pounds in one day or 5 pounds in one week keep legs up while sitting track weight in diary use salt in moderation watch for swelling in feet, ankles and legs every day weigh myself daily know when to call the doctorfor fluid weight gain or consistently abnormal BP readings at home track symptoms and what helps feel better or worse check blood sugar at prescribed times: three times daily enter blood sugar readings and medication or insulin into daily log take the blood sugar log to all doctor visits take the blood sugar meter to all doctor visits eat fish at least once per week fill half of plate with vegetables manage portion size check blood pressure daily write blood pressure results in a log or diary learn about high blood pressure take blood pressure log to all doctor appointments call doctor for signs and  symptoms of high blood pressure keep all doctor appointments take medications for blood pressure exactly as prescribed Limit fluid intake to 1 liter per day Collaborate with managed Medicaid team of LCSW, BSW and pharmacist to address identified needs Use [place] Management calendar to record weight , BP and CBGs so readings are all in one place Discuss "cold flashes" with diaphoresis with Dr. Jeanie Cooks on 08/04/21

## 2021-07-30 NOTE — Patient Outreach (Addendum)
Medicaid Managed Care   Nurse Care Manager Note  07/29/2021 Name:  Maria Hudson MRN:  [id number] DOB:  27-Jan-1961  Maria Hudson is an 61 y.o. year old female who is a primary patient of Nolene Ebbs, MD.  The [facility] Managed Care Coordination team was consulted for assistance with:    CHF CAD HTN HLD DMI CKD Stage4 Depression  [name] was given information about Medicaid Managed Care Coordination team services today. Maria Hudson Patient agreed to services and verbal consent obtained.  [name] for follow up visit in response to provider referral for case management and/or care coordination services.   Assessments/Interventions:  Review of past medical history, allergies, medications, health status, including review of consultants reports, laboratory and other test data, was performed as part of comprehensive evaluation and provision of chronic care management services.  SDOH (Social Determinants of Health) assessments and interventions performed:   Care Plan  Allergies  Allergen Reactions   Hydrocodone Itching    Patient able to tolerate when taken with Benadryl.   Oxycodone-Acetaminophen Itching    Patient able to tolerate when taken with Benadryl.    Medications Reviewed Today     Reviewed by [name], RN (Registered Nurse) on 07/29/21 at 1545  Med List Status: <None>   Medication Order Taking? Sig Documenting Provider Last Dose Status Informant  Accu-Chek Softclix Lancets lancets [id number]  3 (three) times daily. [provider]  Active   acetaminophen (TYLENOL) 500 MG tablet [id number] No Take 1,000 mg by mouth every 6 (six) hours as needed for moderate pain or headache. [provider] Taking Active Self  allopurinol (ZYLOPRIM) 100 MG tablet [id number] No Take 100 mg by mouth daily. [provider] Taking Active Self  amLODipine (NORVASC) 10 MG tablet [id number] No Take 10 mg by mouth every morning.  [provider] Taking Active Self  aspirin EC 81 MG tablet [id number] No Take 81 mg by mouth daily. Swallow whole. [provider] Taking Active Self           Med Note [name]   Mon Apr 14, 2021  9:22 PM)    atorvastatin (LIPITOR) 40 MG tablet [id number] No Take 1 tablet (40 mg total) by mouth every evening. Skeet Latch, MD Taking Active   buPROPion [facility]) 100 MG tablet [id number] No Take 100 mg by mouth 2 (two) times daily. [provider] Taking Active Self  carvedilol (COREG) 25 MG tablet [id number] No Take 1 tablet (25 mg total) by mouth 2 (two) times daily with a meal. [name], [name], MD Taking Active Self  chlorthalidone (HYGROTON) 25 MG tablet [id number] No Take 1 tablet (25 mg total) by mouth daily. Skeet Latch, MD Taking Active   cloNIDine (CATAPRES - DOSED IN MG/24 HR) 0.3 mg/24hr patch [id number] No Place 1 patch (0.3 mg total) onto the skin once a week. Skeet Latch, MD Taking Active   cloNIDine (CATAPRES) 0.3 MG tablet [id number] No Take 0.3 mg by mouth 3 (three) times daily.  Patient not taking: Reported on 07/18/2021   [provider] Not Taking Active   diclofenac Sodium (VOLTAREN) 1 % GEL [id number] No Apply 2 g topically 4 (four) times daily.  Patient taking differently: Apply 2 g topically 2 (two) times daily as needed (pain).   [name], DO Taking Active Self           Med Note Valetta Mole May 06, 2021 12:59 PM) Use to  treat left sided chest pain- non cardiac pain  dicyclomine (BENTYL) 10 MG capsule [id number] No Take 10 mg by mouth as needed. [provider] Taking Active   doxazosin (CARDURA) 1 MG tablet [id number] No Take 1 mg by mouth at bedtime.  Patient not taking: Reported on 07/18/2021   [provider] Not Taking Active   ferrous sulfate 325 (65 FE) MG tablet [id number] No Take 325 mg by mouth 2 (two) times daily. [provider] Taking Active   fluticasone  (FLONASE) 50 MCG/ACT nasal spray [id number] No Place 1 spray into both nostrils daily as needed for allergies or rhinitis. [provider] Taking Active Self  hydrALAZINE (APRESOLINE) 100 MG tablet [id number] No Take 100 mg by mouth 3 (three) times daily. [provider] Taking Active   insulin glargine (LANTUS) 100 unit/mL SOPN [id number] No Inject 10 Units into the skin at bedtime. [provider] Taking Active Self           Med Note Georgina Peer, RACHELLE   Fri Jul 18, 2021  3:47 PM) 20 units  isosorbide mononitrate (IMDUR) 60 MG 24 hr tablet [id number] No Take 1.5 tablets (90 mg total) by mouth daily. [place], Maricela Bo, FNP Taking Active   loratadine (CLARITIN) 10 MG tablet [id number] No Take 10 mg by mouth daily. [provider] Taking Active Self  Multiple Vitamin (MULTIVITAMIN ADULT) TABS [id number] No Take 1 tablet by mouth daily. [provider] Taking Active Self  nitroGLYCERIN (NITROSTAT) 0.4 MG SL tablet [id number] No Place 1 tablet (0.4 mg total) under the tongue every 5 (five) minutes as needed for chest pain.  Patient not taking: Reported on 07/09/2021   [name], [name], MD Not Taking Active Self           Med Note ([name], [name]   Wed Jul 09, 2021  8:54 AM) On hand   NOVOLOG FLEXPEN 100 UNIT/ML FlexPen [id number] No Inject 8 Units into the skin 3 (three) times daily with meals.  Patient not taking: Reported on 07/18/2021   [provider] Not Taking Active Self           Med Note [name], MELISSA R   Tue May 06, 2021  5:01 PM)    Omega-3 Fatty Acids (FISH OIL) 1000 MG CAPS [id number] No Take 1,000 mg by mouth daily. [provider] Taking Active Self  oxyCODONE (ROXICODONE) 15 MG immediate release tablet [id number] No Take 1 tablet by mouth as needed. [provider] Taking Active   pantoprazole (PROTONIX) 40 MG tablet [id number] No Take 1 tablet (40 mg total) by mouth daily. [name], DO Taking Active Self   polyethylene glycol (MIRALAX / GLYCOLAX) 17 g packet [id number] No Take 17 g by mouth daily. [name], [name], MD Taking Active Self  pregabalin (LYRICA) 25 MG capsule [id number] No Take 25 mg by mouth 2 (two) times daily. [provider] Taking Active Self  sertraline (ZOLOFT) 50 MG tablet [id number] No Take 50 mg by mouth daily. [provider] Taking Active Self  torsemide (DEMADEX) 20 MG tablet [id number] No Take 1 tablet (20 mg total) by mouth every other day. Patient takes 1 tablet by mouth every other day as needed for fluid retention. [place], Maricela Bo, FNP Taking Active   traZODone (DESYREL) 50 MG tablet [id number] No Take 50 mg by mouth at bedtime. [provider] Taking Active Self            Patient Active Problem List  Diagnosis Date Noted   OSA (obstructive sleep apnea) 05/27/2021   Pure hypercholesterolemia 05/27/2021   Chronic diastolic CHF (congestive heart failure) ([place]) 04/29/2021   Hypertensive urgency 04/14/2021   Acute on chronic diastolic CHF (congestive heart failure) ([place]) 04/14/2021   Chest pain 03/05/2021   CAD (coronary artery disease) 03/05/2021   T2DM (type 2 diabetes mellitus) ([place]) 03/05/2021   CKD (chronic kidney disease) stage 4, GFR 15-29 ml/min ([place]) 03/05/2021   Class 3 obesity ([place]) 03/05/2021   CVA (cerebral vascular accident) ([place]) 03/05/2021   Acute on chronic diastolic heart failure ([place]) 03/05/2021   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   AKI (acute kidney injury) ([place]) 03/05/2021   Vitreous hemorrhage of left eye ([place]) 11/14/2020   Orthopnea 08/06/2020   Vision loss of right eye 08/06/2020   Excessive daytime sleepiness 01/11/2020   Iron deficiency anemia 12/14/2019   Vitreous hemorrhage, right ([place]) 12/04/2019   Diabetic neuropathy ([place]) 07/26/2019   Proliferative diabetic retinopathy of both 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 ([place]) 02/03/2019   Tophaceous gout of joint  10/02/2018   Conductive hearing loss, bilateral 03/03/2018   Chronic bilateral low back pain without sciatica 08/21/2015   Chronic kidney disease (CKD) stage G3b/A3, moderately decreased glomerular filtration rate (GFR) between 30-44 mL/min/1.73 square meter and albuminuria creatinine ratio greater than 300 mg/g ([place]) 04/30/2015   Type 2 diabetes mellitus with diabetic polyneuropathy, with long-term current use of insulin ([place]) 02/15/2015   Advance directive on file 05/17/2014   Refusal of blood transfusions as patient is Jehovah's Witness 05/17/2014   Coronary artery disease 07/07/2013   Severe obesity (BMI >= 40) ([place]) 05/18/2013   Neuropathy 65/99/3570   Diastolic heart failure ([place]) 10/16/2012   Essential hypertension 10/16/2012    Care Plan : RN Care Manager Plan Of Care  Updates made by [name], RN since 07/30/2021 12:00 AM     Problem: Knowledge Deficit and Care Coordination Needs Related to Management of HTN, HF, Type 2 DM   Priority: High     Long-Range Goal: Development of Plan of Care to Address Knowledge Deficits and Care Coordination Needs related to  management of CHF, CAD, HTN, HLD, DMII, CKD Stage 4, and Depression   Start Date: 05/06/2021  Expected End Date: 05/06/2022  Priority: High  Note:   Current Barriers:  Knowledge Deficits related to plan of care for management of CHF, CAD, HTN, HLD, DMII, CKD Stage 4, and Depression: depressed mood loss of energy/fatigue insomnia disturbed sleep decreased appetite difficulty concentrating recurrent thoughts of death  Care Coordination needs related to Housing barriers and Mental Health Concerns  and personal care services Patient says she fell on 2/7 at home after she tripped on her granddaughter's book bag and fractured her right knee cap , says she is wearing a knee immobilizer and trying to keep her leg elevated when she sits, she is managing the pain with rest. Says she will see an orthopedist in the near  future. Says she saw gastroenterologist on 1/25 for intermittent right chest, abdominal pain and chronic constipation and had abdominal ultrasound without acute findings , she says she is successfully treating her chronic constipation with moringa supplements   She c/o intermittent episodes of feeling very cold with sweating and is wondering if these symptoms may be due to menopause, says she had uterus and one ovary removed years ago RNCM Clinical Goal(s):  Patient will demonstrate ongoing adherence to prescribed treatment plan for CHF, CAD, HTN, HLD, DMII,  CKD Stage 4, and Depression as evidenced by patient report of improved health and quality of life and no unplanned ED or unplanned hospital admissions continue to work with RN Care Manager and/or Social Worker to address care management and care coordination needs related to CHF, CAD, HTN, HLD, DMII, CKD Stage 4, and Depression as evidenced by adherence to CM Team Scheduled appointments      work with managed Medicaid BSW to address needs related to housing barriers as evidenced by patient receiving resources from [employer] for low income housing options in [place] and to apply for personal care services- patient says she was told by the managed Medicaid BSW that she does not qualify for PCS or CAP  Interventions: Inter-disciplinary care team collaboration (see longitudinal plan of care) Evaluation of current treatment plan related to  self management and patient's adherence to plan as established by provider Assessed status of low income housing and finding apartment     Heart Failure Interventions:  (Status: Goal on Track (progressing): YES.)  Long Term Goal - patient states her fluid retention has significantly improved,  says she cannot tolerate the compression stockings because they are too tight and make her legs hurt Assessed patient's heart failure management strategies  Diabetes:  (Status: Goal on Track (progressing): YES.) Long Term Goal  - patient says her CBGS have been quite variable and she is not sure why, denies hypoglycemia or sustained hyperglycemia  Lab Results  Component Value Date   HGBA1C 6.7 (H) 04/29/2021  Assessed patient's understanding of A1C goal: <7% Reviewed medications with patient and discussed importance of medication adherence;        Counseled on importance of regular laboratory monitoring as prescribed;        Discussed plans with patient for ongoing care management follow up and provided patient with direct contact information for care management team;      Advised patient, providing education and rationale, to check cbg three times daily and record        call provider for findings outside established parameters;       Review of patient status, including review of consultants reports, relevant laboratory and other test results, and medications completed;        Hypertension: (Status: Goal on track: NO.) Long Term Goal- patient blood pressure continues to be very difficult to control due to orthostatic  hypotension episodes and elevated when sitting- patient is now working with cardiology pharmacist to assist with management Last practice recorded BP readings:   BP Readings from Last 3 Encounters:  07/24/21 (!) 181/70  07/09/21 (!) 170/70  07/07/21 (!) 168/80   Most recent eGFR/CrCl:  Lab Results  Component Value Date   EGFR 19 (L) 03/13/2021    No components found for: CRCL  Evaluation of current treatment plan related to hypertension self management and patient's adherence to plan as established by provider;   Reviewed medications with patient and discussed importance of compliance;  Discussed plans with patient for ongoing care management follow up and provided patient with direct contact information for care management team; Advised patient, providing education and rationale, to monitor blood pressure daily and record, calling PCP for findings outside established parameters;  Reviewed  scheduled/upcoming provider appointments including: with Dr. Jeanie Cooks on 08/04/21  Interdisciplinary Collaboration:  (Status: Goal on Track (progressing): YES.) Long Term Goal  Collaborated with BSW to initiate plan of care to address needs related to Housing barriers, Inability to perform ADL's independently, and Inability to perform IADL's independently  in patient with CHF, CAD, HTN, HLD, DM, CKD Stage 4, and Depression Assessed status of patient's involvement with behavioral health counselor Collaboration with pharmacist for medication review, discussion of ? Medication side effects causing chronic constipation and arranging for patient to received home delivery of medications in blister packs Collaboration with Nolene Ebbs, MD regarding development and update of comprehensive plan of care as evidenced by provider attestation and co-signature Inter-disciplinary care team collaboration (see longitudinal plan of care)  Patient Goals/Self-Care Activities: Take medications as prescribed   Attend all scheduled provider appointments Call pharmacy for medication refills 3-7 days in advance of running out of medications Attend church or other social activities Perform all self care activities independently  Call provider office for new concerns or questions  Work with the social worker to address care coordination needs and will continue to work with the clinical team to address health care and disease management related needs call office if I gain more than 2 pounds in one day or 5 pounds in one week keep legs up while sitting track weight in diary use salt in moderation watch for swelling in feet, ankles and legs every day weigh myself daily know when to call the doctorfor fluid weight gain or consistently abnormal BP readings at home track symptoms and what helps feel better or worse check blood sugar at prescribed times: three times daily enter blood sugar readings and medication or insulin  into daily log take the blood sugar log to all doctor visits take the blood sugar meter to all doctor visits eat fish at least once per week fill half of plate with vegetables manage portion size check blood pressure daily write blood pressure results in a log or diary learn about high blood pressure take blood pressure log to all doctor appointments call doctor for signs and symptoms of high blood pressure keep all doctor appointments take medications for blood pressure exactly as prescribed Limit fluid intake to 1 liter per day Collaborate with managed Medicaid team of LCSW, BSW and pharmacist to address identified needs Use [employer] calendar to record weight , BP and CBGs so readings are all in one place Discuss "cold flashes" with diaphoresis with Dr. Jeanie Cooks on 08/04/21       Follow Up:  Patient agrees to Care Plan and Follow-up.  Plan: The Managed Medicaid care management team will reach out to the patient again over the next 30 days.  Date/time of next scheduled RN care management/care coordination outreach:  08/26/21 at 3:00 pm  [name], [name], Spr[place] Management Coordinator - Managed [place] High Risk [phone]

## 2021-07-31 ENCOUNTER — Encounter (HOSPITAL_BASED_OUTPATIENT_CLINIC_OR_DEPARTMENT_OTHER): Payer: Medicaid Other | Admitting: Cardiovascular Disease

## 2021-07-31 NOTE — Patient Instructions (Signed)
Visit Information  Ms. Axton was given information about Medicaid Managed Care team care coordination services as a part of their [facility] Medicaid benefit. Chandrika Sandles verbally consented to engagement with the [facility] Managed Care team.   If you are experiencing a medical emergency, please call 911 or report to your local emergency department or urgent care.   If you have a non-emergency medical problem during routine business hours, please contact your provider's office and ask to speak with a nurse.   For questions related to your [facility] health plan, please call: [phone] or go here:[email or web address]  If you would like to schedule transportation through your [facility] plan, please call the following number at least 2 days in advance of your appointment: [phone].  Call the [place] at [phone], at any time, 24 hours a day, 7 days a week. If you are in danger or need immediate medical attention call 911.  If you would like help to quit smoking, call 1-800-QUIT-NOW (947)291-6864) OR Espaol: 1-855-Djelo-Ya (3-762-831-5176) o para ms informacin haga clic aqu or Text READY to 200-400 to register via text  Maria Hudson - following are the goals we discussed in your visit today:   Goals Addressed   None     The patient verbalized understanding of instructions, educational materials, and care plan provided today and declined offer to receive copy of patient instructions, educational materials, and care plan.   08/25/21 Behavioral Health 08/26/21 Care Manager 08/28/21 Social Worker  Hughes Better PharmD, CPP High Risk Managed Medicaid [place] [phone]  Following is a copy of your plan of care:  Care Plan : General Pharmacy (Adult)  Updates made by Hughes Better, RPH-CPP since 07/31/2021 12:00 AM     Problem: Chronic Disease Management   Priority: High  Onset Date: 06/03/2021     Long-Range Goal:  Managing Chronic Disease Management   Start Date: 06/03/2021  Expected End Date: 09/01/2021  Recent Progress: On track  Priority: High  Note:   Current Barriers:  Unable to achieve control of depression, edema, and hypertension  Unable to maintain control of T2DM  Pharmacist Clinical Goal(s):  patient will maintain control of T2DM as evidenced by A1C and home blood glucose readings  achieve improvement in depression, edema, and hypertension as evidenced by PHQ9 and vital signs  through collaboration with PharmD and provider.   Interventions: Inter-disciplinary care team collaboration (see longitudinal plan of care) Comprehensive medication review performed; medication list updated in electronic medical record  Diabetes:  Uncontrolled; current treatment: Lantus 20 units once daily, Novolog 8 units three times daily with meals (out for one week due to patient continuing to take medication via sliding scale as opposed to 8 units consistently)  Current glucose readings: fasting glucose: 150-200's  Denies hypoglycemic/hyperglycemic symptoms  Current meal patterns:  Breakfast: omlette + 1/2 avocado and slice cheese Evening: [place] burger Drinks: water w/ or w/o zero sugar powder, decaf coffee w/ splenda  Recommended patient continue to check blood glucose readings 2-3x/day       At next follow-up appointment can discuss CGM and alternative therapy including GLP1       Recommended patient follow directions for Novolog and if she would like to go back to her sliding scale to discuss with her PCP so she has correct amount of insulin  Hypertension:  Uncontrolled but being closely monitored; current treatment:amlodipine 10mg , carvedilol 25mg , clonidine 0.3mg  three times daily, hydralazine 50 units three times daily, isorbide mononitrate 60mg , spironolactone 25mg ,  torsemide 40mg    Current home readings: sitting: 267/12 standing: systolic drops about 45-80 points  Denies hypotensive/hypertensive  symptoms  Recommended patient continue to follow-up with cardiology office and check blood pressure readings at home   Edema       Controlled; current treatment: torsemide 40mg        Patient reports she does not have any additional fluid weight at the moment       Recommended patient continue to follow-up with cardiology and continue to weigh herself daily  Depression/Anxiety:  Uncontrolled; current treatment: bupropion 100mg  twice daily, sertraline 50mg  daily;   PHQ9: 19  Patient established with therapist and psychiatrist   Recommended patient continue to follow-up with them  Constipation       Controlled; Miralax       Patient reports that her constipation has resolved and she is actually having loose stools.       Recommended patient back down on Miralax to regulate stools       PCP put in referral to GI yesterday and recommended patient continue to follow-up with PCP if she does not hear back regarding GI referral in next few  weeks  Patient Goals/Self-Care Activities patient will:  - check glucose 2-3x/day, document, and provide at future appointments - check blood pressure daily, document, and provide at future appointments - weigh daily - contact [facility] to set up pill-packaging services  Follow Up Plan:  Telephone follow up appointment with care management team member scheduled for: 08/26/21 Care Manager; 08/28/21 Social Worker 08/25/21 Behavioral Health

## 2021-07-31 NOTE — Patient Outreach (Signed)
Medicaid Managed Care Pharmacy Note  07/18/2021 Name:  Maria Hudson MRN:  [id number] DOB:  September 10, 1960  Maria Hudson is an 61 y.o. year old female who is a primary patient of Nolene Ebbs, MD.  The [facility] Managed Care Coordination team was consulted for assistance with disease management and care coordination needs.    [name] for follow up visit in response to referral for case management and/or care coordination services.  [name] was given information about Medicaid Managed Care Coordination team services today. [name] Patient agreed to services and verbal consent obtained.  Objective:  Lab Results  Component Value Date   CREATININE 3.31 (H) 07/16/2021   CREATININE 4.45 (H) 07/07/2021   CREATININE 2.71 (H) 06/05/2021    Lab Results  Component Value Date   HGBA1C 6.7 (H) 04/29/2021    BP Readings from Last 3 Encounters:  07/24/21 (!) 181/70  07/09/21 (!) 170/70  07/07/21 (!) 168/80    Care Plan  Allergies  Allergen Reactions   Hydrocodone Itching    Patient able to tolerate when taken with Benadryl.   Oxycodone-Acetaminophen Itching    Patient able to tolerate when taken with Benadryl.    Medications Reviewed Today     Reviewed by [name], RN (Registered Nurse) on 07/29/21 at 1545  Med List Status: <None>   Medication Order Taking? Sig Documenting Provider Last Dose Status Informant  Accu-Chek Softclix Lancets lancets [id number]  3 (three) times daily. [provider]  Active   acetaminophen (TYLENOL) 500 MG tablet [id number] No Take 1,000 mg by mouth every 6 (six) hours as needed for moderate pain or headache. [provider] Taking Active Self  allopurinol (ZYLOPRIM) 100 MG tablet [id number] No Take 100 mg by mouth daily. [provider] Taking Active Self  amLODipine (NORVASC) 10 MG tablet [id number] No Take 10 mg by mouth every morning. [provider] Taking Active Self  aspirin  EC 81 MG tablet [id number] No Take 81 mg by mouth daily. Swallow whole. [provider] Taking Active Self           Med Note [name]   Mon Apr 14, 2021  9:22 PM)    atorvastatin (LIPITOR) 40 MG tablet [id number] No Take 1 tablet (40 mg total) by mouth every evening. Skeet Latch, MD Taking Active   buPROPion [facility]) 100 MG tablet [id number] No Take 100 mg by mouth 2 (two) times daily. [provider] Taking Active Self  carvedilol (COREG) 25 MG tablet [id number] No Take 1 tablet (25 mg total) by mouth 2 (two) times daily with a meal. [name], [name], MD Taking Active Self  chlorthalidone (HYGROTON) 25 MG tablet [id number] No Take 1 tablet (25 mg total) by mouth daily. Skeet Latch, MD Taking Active   cloNIDine (CATAPRES - DOSED IN MG/24 HR) 0.3 mg/24hr patch [id number] No Place 1 patch (0.3 mg total) onto the skin once a week. Skeet Latch, MD Taking Active   cloNIDine (CATAPRES) 0.3 MG tablet [id number] No Take 0.3 mg by mouth 3 (three) times daily.  Patient not taking: Reported on 07/18/2021   [provider] Not Taking Active   diclofenac Sodium (VOLTAREN) 1 % GEL [id number] No Apply 2 g topically 4 (four) times daily.  Patient taking differently: Apply 2 g topically 2 (two) times daily as needed (pain).   [name], DO Taking Active Self           Med Note [name], JANET S  Tue May 06, 2021 12:59 PM) Use to treat left sided chest pain- non cardiac pain  dicyclomine (BENTYL) 10 MG capsule [id number] No Take 10 mg by mouth as needed. [provider] Taking Active   doxazosin (CARDURA) 1 MG tablet [id number] No Take 1 mg by mouth at bedtime.  Patient not taking: Reported on 07/18/2021   [provider] Not Taking Active   ferrous sulfate 325 (65 FE) MG tablet [id number] No Take 325 mg by mouth 2 (two) times daily. [provider] Taking Active   fluticasone (FLONASE) 50 MCG/ACT nasal spray [id number] No Place 1  spray into both nostrils daily as needed for allergies or rhinitis. [provider] Taking Active Self  hydrALAZINE (APRESOLINE) 100 MG tablet [id number] No Take 100 mg by mouth 3 (three) times daily. [provider] Taking Active   insulin glargine (LANTUS) 100 unit/mL SOPN [id number] No Inject 10 Units into the skin at bedtime. [provider] Taking Active Self           Med Note Georgina Peer, Adriene Knipfer   Fri Jul 18, 2021  3:47 PM) 20 units  isosorbide mononitrate (IMDUR) 60 MG 24 hr tablet [id number] No Take 1.5 tablets (90 mg total) by mouth daily. [place], Maricela Bo, FNP Taking Active   loratadine (CLARITIN) 10 MG tablet [id number] No Take 10 mg by mouth daily. [provider] Taking Active Self  Multiple Vitamin (MULTIVITAMIN ADULT) TABS [id number] No Take 1 tablet by mouth daily. [provider] Taking Active Self  nitroGLYCERIN (NITROSTAT) 0.4 MG SL tablet [id number] No Place 1 tablet (0.4 mg total) under the tongue every 5 (five) minutes as needed for chest pain.  Patient not taking: Reported on 07/09/2021   [name], [name], MD Not Taking Active Self           Med Note ([name], [name]   Wed Jul 09, 2021  8:54 AM) On hand   NOVOLOG FLEXPEN 100 UNIT/ML FlexPen [id number] No Inject 8 Units into the skin 3 (three) times daily with meals.  Patient not taking: Reported on 07/18/2021   [provider] Not Taking Active Self           Med Note [name], MELISSA R   Tue May 06, 2021  5:01 PM)    Omega-3 Fatty Acids (FISH OIL) 1000 MG CAPS [id number] No Take 1,000 mg by mouth daily. [provider] Taking Active Self  oxyCODONE (ROXICODONE) 15 MG immediate release tablet [id number] No Take 1 tablet by mouth as needed. [provider] Taking Active   pantoprazole (PROTONIX) 40 MG tablet [id number] No Take 1 tablet (40 mg total) by mouth daily. [name], DO Taking Active Self  polyethylene glycol (MIRALAX / GLYCOLAX) 17 g packet  [id number] No Take 17 g by mouth daily. [name], [name], MD Taking Active Self  pregabalin (LYRICA) 25 MG capsule [id number] No Take 25 mg by mouth 2 (two) times daily. [provider] Taking Active Self  sertraline (ZOLOFT) 50 MG tablet [id number] No Take 50 mg by mouth daily. [provider] Taking Active Self  torsemide (DEMADEX) 20 MG tablet [id number] No Take 1 tablet (20 mg total) by mouth every other day. Patient takes 1 tablet by mouth every other day as needed for fluid retention. [place], Maricela Bo, FNP Taking Active   traZODone (DESYREL) 50 MG tablet [id number] No Take 50 mg by mouth at bedtime. [provider] Taking Active Self  Patient Active Problem List   Diagnosis Date Noted   OSA (obstructive sleep apnea) 05/27/2021   Pure hypercholesterolemia 05/27/2021   Chronic diastolic CHF (congestive heart failure) ([place]) 04/29/2021   Hypertensive urgency 04/14/2021   Acute on chronic diastolic CHF (congestive heart failure) ([facility]) 04/14/2021   Chest pain 03/05/2021   CAD (coronary artery disease) 03/05/2021   T2DM (type 2 diabetes mellitus) ([place]) 03/05/2021   CKD (chronic kidney disease) stage 4, GFR 15-29 ml/min ([place]) 03/05/2021   Class 3 obesity ([place]) 03/05/2021   CVA (cerebral vascular accident) ([place]) 03/05/2021   Acute on chronic diastolic heart failure ([place]) 03/05/2021   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   AKI (acute kidney injury) ([place]) 03/05/2021   Vitreous hemorrhage of left eye ([place]) 11/14/2020   Orthopnea 08/06/2020   Vision loss of right eye 08/06/2020   Excessive daytime sleepiness 01/11/2020   Iron deficiency anemia 12/14/2019   Vitreous hemorrhage, right ([place]) 12/04/2019   Diabetic neuropathy ([place]) 07/26/2019   Proliferative diabetic retinopathy of both 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 ([place]) 02/03/2019   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   Conductive hearing loss, bilateral 03/03/2018    Chronic bilateral low back pain without sciatica 08/21/2015   Chronic kidney disease (CKD) stage G3b/A3, moderately decreased glomerular filtration rate (GFR) between 30-44 mL/min/1.73 square meter and albuminuria creatinine ratio greater than 300 mg/g ([place]) 04/30/2015   Type 2 diabetes mellitus with diabetic polyneuropathy, with long-term current use of insulin ([place]) 02/15/2015   Advance directive on file 05/17/2014   Refusal of blood transfusions as patient is Jehovah's Witness 05/17/2014   Coronary artery disease 07/07/2013   Severe obesity (BMI >= 40) ([place]) 05/18/2013   Neuropathy 41/32/4401   Diastolic heart failure ([place]) 10/16/2012   Essential hypertension 10/16/2012    Conditions to be addressed/monitored per PCP order:  HTN, DMII, Anxiety, and edema, and constipation  Care Plan : General Pharmacy (Adult)  Updates made by Hughes Better, RPH-CPP since 07/31/2021 12:00 AM     Problem: Chronic Disease Management   Priority: High  Onset Date: 06/03/2021     Long-Range Goal: Managing Chronic Disease Management   Start Date: 06/03/2021  Expected End Date: 09/01/2021  Recent Progress: On track  Priority: High  Note:   Current Barriers:  Unable to achieve control of depression, edema, and hypertension  Unable to maintain control of T2DM  Pharmacist Clinical Goal(s):  patient will maintain control of T2DM as evidenced by A1C and home blood glucose readings  achieve improvement in depression, edema, and hypertension as evidenced by PHQ9 and vital signs  through collaboration with PharmD and provider.   Interventions: Inter-disciplinary care team collaboration (see longitudinal plan of care) Comprehensive medication review performed; medication list updated in electronic medical record  Diabetes:  Uncontrolled; current treatment: Lantus 20 units once daily, Novolog 8 units three times daily with meals (out for one week due to patient continuing to take medication via sliding  scale as opposed to 8 units consistently)  Current glucose readings: fasting glucose: 150-200's  Denies hypoglycemic/hyperglycemic symptoms  Current meal patterns:  Breakfast: omlette + 1/2 avocado and slice cheese Evening: [place] burger Drinks: water w/ or w/o zero sugar powder, decaf coffee w/ splenda  Recommended patient continue to check blood glucose readings 2-3x/day       At next follow-up appointment can discuss CGM and alternative therapy including GLP1       Recommended patient follow directions for Novolog and if she would like to go  back to her sliding scale to discuss with her PCP so she has correct amount of insulin  Hypertension:  Uncontrolled but being closely monitored; current treatment:amlodipine 10mg , carvedilol 25mg , clonidine 0.3mg  three times daily, hydralazine 50 units three times daily, isorbide mononitrate 60mg , spironolactone 25mg , torsemide 40mg    Current home readings: sitting: 758/83 standing: systolic drops about 25-49 points  Denies hypotensive/hypertensive symptoms  Recommended patient continue to follow-up with cardiology office and check blood pressure readings at home   Edema       Controlled; current treatment: torsemide 40mg        Patient reports she does not have any additional fluid weight at the moment       Recommended patient continue to follow-up with cardiology and continue to weigh herself daily  Depression/Anxiety:  Uncontrolled; current treatment: bupropion 100mg  twice daily, sertraline 50mg  daily;   PHQ9: 19  Patient established with therapist and psychiatrist   Recommended patient continue to follow-up with them  Constipation       Controlled; Miralax       Patient reports that her constipation has resolved and she is actually having loose stools.       Recommended patient back down on Miralax to regulate stools       PCP put in referral to GI yesterday and recommended patient continue to follow-up with PCP if she does not hear back  regarding GI referral in next few  weeks  Patient Goals/Self-Care Activities patient will:  - check glucose 2-3x/day, document, and provide at future appointments - check blood pressure daily, document, and provide at future appointments - weigh daily - contact [facility] to set up pill-packaging services  Follow Up Plan:  Telephone follow up appointment with care management team member scheduled for: 08/26/21 Care Manager; 08/28/21 Social Worker 08/25/21 [place] PharmD, CPP High Risk Managed Medicaid [place] [phone]

## 2021-08-12 ENCOUNTER — Telehealth: Payer: Self-pay

## 2021-08-12 NOTE — Telephone Encounter (Signed)
Pt is scheduled for her CT at [facility] on 08/20/21 at 10:30 am.

## 2021-08-12 NOTE — Telephone Encounter (Signed)
-----   Message from Yevette Edwards, RN sent at 08/08/2021  7:56 AM EST ----- Regarding: FW: F/U appt  ----- Message ----- From: Yevette Edwards, RN Sent: 08/08/2021  12:00 AM EST To: Yevette Edwards, RN Subject: F/U appt                                       Patient needs a f/u appt with Dr. Loletha Carrow after CT appt.  RE: abdominal pain, nausea, constipation

## 2021-08-12 NOTE — Telephone Encounter (Signed)
Radiology scheduling has not contacted pt to set up her CT appt. I called and spoke with patient, I provided her with the radiology scheduling phone number to call and set up her appt at her convenience. Pt has been scheduled for a f/u appt with Dr. Loletha Carrow on Wednesday, 09/03/21 at 8:20 am. Pt is aware that she will need her CT completed prior to her f/u appt. Pt verbalized understanding and had no concerns at the end of the call.

## 2021-08-13 DIAGNOSIS — Z419 Encounter for procedure for purposes other than remedying health state, unspecified: Secondary | ICD-10-CM | POA: Diagnosis not present

## 2021-08-14 DIAGNOSIS — M25561 Pain in right knee: Secondary | ICD-10-CM | POA: Diagnosis not present

## 2021-08-15 ENCOUNTER — Ambulatory Visit: Payer: Medicaid Other

## 2021-08-18 DIAGNOSIS — S82001D Unspecified fracture of right patella, subsequent encounter for closed fracture with routine healing: Secondary | ICD-10-CM | POA: Diagnosis not present

## 2021-08-18 DIAGNOSIS — E1122 Type 2 diabetes mellitus with diabetic chronic kidney disease: Secondary | ICD-10-CM | POA: Diagnosis not present

## 2021-08-18 DIAGNOSIS — M10062 Idiopathic gout, left knee: Secondary | ICD-10-CM | POA: Diagnosis not present

## 2021-08-19 ENCOUNTER — Other Ambulatory Visit: Payer: Self-pay | Admitting: *Deleted

## 2021-08-19 ENCOUNTER — Encounter (HOSPITAL_COMMUNITY): Payer: Self-pay | Admitting: Cardiology

## 2021-08-19 ENCOUNTER — Other Ambulatory Visit: Payer: Self-pay

## 2021-08-19 ENCOUNTER — Ambulatory Visit (HOSPITAL_BASED_OUTPATIENT_CLINIC_OR_DEPARTMENT_OTHER)
Admission: RE | Admit: 2021-08-19 | Discharge: 2021-08-19 | Disposition: A | Discharge status: Home / Self Care | Payer: Medicaid Other | Source: Ambulatory Visit | Attending: Cardiology | Admitting: Cardiology

## 2021-08-19 ENCOUNTER — Telehealth: Payer: Self-pay | Admitting: Pharmacist

## 2021-08-19 VITALS — BP 144/80 | HR 59 | Wt 229.6 lb

## 2021-08-19 DIAGNOSIS — I951 Orthostatic hypotension: Secondary | ICD-10-CM | POA: Diagnosis not present

## 2021-08-19 DIAGNOSIS — E669 Obesity, unspecified: Secondary | ICD-10-CM | POA: Insufficient documentation

## 2021-08-19 DIAGNOSIS — I5032 Chronic diastolic (congestive) heart failure: Secondary | ICD-10-CM | POA: Diagnosis not present

## 2021-08-19 DIAGNOSIS — E1122 Type 2 diabetes mellitus with diabetic chronic kidney disease: Secondary | ICD-10-CM | POA: Insufficient documentation

## 2021-08-19 DIAGNOSIS — I130 Hypertensive heart and chronic kidney disease with heart failure and stage 1 through stage 4 chronic kidney disease, or unspecified chronic kidney disease: Secondary | ICD-10-CM | POA: Diagnosis not present

## 2021-08-19 DIAGNOSIS — Z79899 Other long term (current) drug therapy: Secondary | ICD-10-CM | POA: Diagnosis not present

## 2021-08-19 DIAGNOSIS — Z7982 Long term (current) use of aspirin: Secondary | ICD-10-CM | POA: Diagnosis not present

## 2021-08-19 DIAGNOSIS — N184 Chronic kidney disease, stage 4 (severe): Secondary | ICD-10-CM

## 2021-08-19 DIAGNOSIS — I2510 Atherosclerotic heart disease of native coronary artery without angina pectoris: Secondary | ICD-10-CM | POA: Diagnosis not present

## 2021-08-19 LAB — BASIC METABOLIC PANEL
Anion gap: 10 (ref 5–15)
BUN: 77 mg/dL — ABNORMAL HIGH (ref 6–20)
CO2: 21 mmol/L — ABNORMAL LOW (ref 22–32)
Calcium: 9.4 mg/dL (ref 8.9–10.3)
Chloride: 105 mmol/L (ref 98–111)
Creatinine, Ser: 2.51 mg/dL — ABNORMAL HIGH (ref 0.44–1.00)
GFR, Estimated: 22 mL/min — ABNORMAL LOW (ref >60)
Glucose, Bld: 122 mg/dL — ABNORMAL HIGH (ref 70–99)
Potassium: 3.9 mmol/L (ref 3.5–5.1)
Sodium: 136 mmol/L (ref 135–145)

## 2021-08-19 LAB — CBC
HCT: 35.3 % — ABNORMAL LOW (ref 36.0–46.0)
Hemoglobin: 11.5 g/dL — ABNORMAL LOW (ref 12.0–15.0)
MCH: 26.6 pg (ref 26.0–34.0)
MCHC: 32.6 g/dL (ref 30.0–36.0)
MCV: 81.7 fL (ref 80.0–100.0)
Platelets: 280 10*3/uL (ref 150–400)
RBC: 4.32 MIL/uL (ref 3.87–5.11)
RDW: 16.3 % — ABNORMAL HIGH (ref 11.5–15.5)
WBC: 9.4 10*3/uL (ref 4.0–10.5)
nRBC: 0 % (ref 0.0–0.2)

## 2021-08-19 LAB — BRAIN NATRIURETIC PEPTIDE: B Natriuretic Peptide: 122.4 pg/mL — ABNORMAL HIGH (ref 0.0–100.0)

## 2021-08-19 MED ORDER — NIFEDIPINE ER 60 MG PO TB24: 120.0000 mg | ORAL_TABLET | Freq: Every day | ORAL | 11 refills | Status: DC

## 2021-08-19 NOTE — Progress Notes (Addendum)
PCP: Nolene Ebbs, MD HF Cardiology: Dr. Aundra Dubin  61 y.o. with history of supine hypertension with orthostatic hypotension, CKD stage 4, CAD, and chronic diastolic CHF was referred from [facility] clinic for evaluation of CHF.    Patient had MI in 05/2013. LHC showed 20% LAD, 50 to 60% left circumflex, and 100% RPDA stenosis with left-to-right collaterals. CAD managed medically.    She reports a long h/o diastolic heart failure and frequent hospitalizations. She moved to [place] from [facility], had been followed by [facility]. Has struggled w/ volume control and readjustment of diuretics over the years. She reports previously being prescribed Wilder Glade but had to stop b/c her insurance would not cover it. Her last echo at [facility] was 7/21. LVEF was 55%, RV normal. RVSP elevated at 60 mmHg. PYP at [facility] was negative for amyloid (grade 1, H/CL 1.21). Serum IFE not suggestive of monoclonal gammopathy. Urine IFE and immunoglobin free light chains were elevated (lg FLCK 9.24, Ig FLCL 3.75, K/L ratio 2.46. Repeat testing recommended 6 months later, but do not see that this was completed).    Referred to HTN clinic 9/22 for uncontrolled HTN. Now on multidrug regimen. Thyroid function, renin, and aldosterone were normal, as were catecholamines and metanephrines. Her PTH was elevated at 134 but calcium was normal. Renal artery doppler study was limited. There was no evidence of right RAS however the left renal artery was not well visualized due to the presence of bowel gas.  Echo was repeated 9/22 showing normal LVEF 55-60%, G1DD and normal RV.    She has struggled w/ fluid management recently, c/b stage IV CKD. Baseline SCr ~2.3.  Followed by nephrology. Previously on Bumex and metolazone. She was admitted 17/61 for a/c diastolic CHF and diuresed w/ IV Lasix. Did not get RHC. Transitioned back to PO diuretics, Torsemide 40 mg once daily + spiro 25 mg daily. D/w Wt 231 lb. Referred to [facility] clinic.   She was admitted again in  11/22 with chest pain, AKI, and orthostatic hypotension.  Torsemide was stopped.  HS-TnI was negative, suspect musculoskeletal chest pain.    PYP study repeated in 12/22 was not suggestive of cardiac amyloidosis.   She returns for evaluation of CHF.  She recently tripped, fell, and fractured her right knee cap.  She is wearing a brace to walk.  She also has gout in her left knee currently and is on prednisone.  She is not very active due to the fracture and gout.  She comes in in a wheelchair today.  Weight is up 7 lbs.  She feels like the current dose of torsemide is adequate, minimal edema.  She is short of breath if she tries to walk a long distance, but, as above, not walking much with patellar fracture and gout. BP continues to run high.   Labs (11/22): K 3.8, creatinine 3.6 => 2.62 Labs (2/23): K 3.7, creatinine 3.31   PMH: 1. HTN: Urine catecholamines and metanephrines normal level, aldosterone normal level.  Complicated by orthostatic hypotension.   - Renal artery dopplers (11/22): No right renal artery stenosis, unable to visualize left renal artery due to overlying bowel gas.  2. Orthostatic hypotension 3. CKD stage 4 4. Type 2 diabetes 5. OSA: Uses CPAP 6. CAD: LHC in 2014 with totally occluded RCA with collaterals, medical management.  7. Chronic diastolic CHF: Echo (6/07) with EF 55%, normal RV, PASP 60 mmHg.  - PYP scan 11/20 at [facility] was negative with grade 1, H/CL 1.21.  -  Echo (9/22): EF 55-60%, mild LVH, normal RV, IVC normal.  - RHC (11/22): mean RA 1, PA 41/10 mean 24, mean PCWP 12, CI 3.  - PYP scan (12/22): grade 1, H/CL 1.  8. Gout 9. COVID-19 2/23  Social History   Socioeconomic History   Marital status: Divorced    Spouse name: Not on file   Number of children: 2   Years of education: Not on file   Highest education level: Some college, no degree  Occupational History   Occupation: retired   Occupation: disabled  Tobacco Use   Smoking status: Never     Passive exposure: Never   Smokeless tobacco: Never  Vaping Use   Vaping Use: Never used  Substance and Sexual Activity   Alcohol use: Not Currently   Drug use: Not Currently   Sexual activity: Not on file  Other Topics Concern   Not on file  Social History Narrative   Not on file   Social Determinants of Health   Financial Resource Strain: Low Risk    Difficulty of Paying Living Expenses: Not very hard  Food Insecurity: No Food Insecurity   Worried About Charity fundraiser in the Last Year: Never true   [place] in the Last Year: Never true  Transportation Needs: No Transportation Needs   Lack of Transportation (Medical): No   Lack of Transportation (Non-Medical): No  Physical Activity: Inactive   Days of Exercise per Week: 0 days   Minutes of Exercise per Session: 0 min  Stress: Stress Concern Present   Feeling of Stress : Very much  Social Connections: Moderately Isolated   Frequency of Communication with Friends and Family: More than three times a week   Frequency of Social Gatherings with Friends and Family: More than three times a week   Attends Religious Services: More than 4 times per year   Active Member of [employer] or Organizations: No   Attends Music therapist: Never   Marital Status: Divorced  Human resources officer Violence: Not At Risk   Fear of Current or Ex-Partner: No   Emotionally Abused: No   Physically Abused: No   Sexually Abused: No   Family History  Problem Relation Age of Onset   Hypertension Mother    Stroke Mother    Diabetes Mother    Other Mother        enlarged heart   Hypertension Father    Hypertension Sister    Diabetes Sister    CAD Sister        enlarged heart   Kidney disease Sister    Diabetes Paternal Grandmother    Hypertension Daughter    Diabetes Daughter    Hypertension Son    Diabetes Son    Breast cancer Paternal Aunt    ROS: All systems reviewed and negative except as per HPI.   Current Outpatient  Medications  Medication Sig Dispense Refill   Accu-Chek Softclix Lancets lancets 3 (three) times daily.     acetaminophen (TYLENOL) 500 MG tablet Take 1,000 mg by mouth every 6 (six) hours as needed for moderate pain or headache.     allopurinol (ZYLOPRIM) 100 MG tablet Take 100 mg by mouth daily.     aspirin EC 81 MG tablet Take 81 mg by mouth daily. Swallow whole.     atorvastatin (LIPITOR) 40 MG tablet Take 1 tablet (40 mg total) by mouth every evening. 90 tablet 3   buPROPion (WELLBUTRIN) 100 MG tablet  Take 100 mg by mouth 2 (two) times daily.     carvedilol (COREG) 25 MG tablet Take 1 tablet (25 mg total) by mouth 2 (two) times daily with a meal. 60 tablet 0   chlorthalidone (HYGROTON) 25 MG tablet Take 1 tablet (25 mg total) by mouth daily. 90 tablet 3   cloNIDine (CATAPRES - DOSED IN MG/24 HR) 0.3 mg/24hr patch Place 1 patch (0.3 mg total) onto the skin once a week. 4 patch 12   diclofenac Sodium (VOLTAREN) 1 % GEL Apply 2 g topically as needed.     dicyclomine (BENTYL) 10 MG capsule Take 10 mg by mouth as needed.     ferrous sulfate 325 (65 FE) MG tablet Take 325 mg by mouth 2 (two) times daily.     fluticasone (FLONASE) 50 MCG/ACT nasal spray Place 1 spray into both nostrils daily as needed for allergies or rhinitis.     hydrALAZINE (APRESOLINE) 100 MG tablet Take 100 mg by mouth 3 (three) times daily.     insulin glargine (LANTUS) 100 unit/mL SOPN Inject 10 Units into the skin at bedtime.     isosorbide mononitrate (IMDUR) 60 MG 24 hr tablet Take 1.5 tablets (90 mg total) by mouth daily. 135 tablet 3   loratadine (CLARITIN) 10 MG tablet Take 10 mg by mouth daily.     Multiple Vitamin (MULTIVITAMIN ADULT) TABS Take 1 tablet by mouth daily.     NIFEdipine (ADALAT CC) 60 MG 24 hr tablet Take 2 tablets (120 mg total) by mouth daily. 60 tablet 11   nitroGLYCERIN (NITROSTAT) 0.4 MG SL tablet Place 1 tablet (0.4 mg total) under the tongue every 5 (five) minutes as needed for chest pain. 20  tablet 0   NOVOLOG FLEXPEN 100 UNIT/ML FlexPen Inject 8 Units into the skin 3 (three) times daily with meals.     Omega-3 Fatty Acids (FISH OIL) 1000 MG CAPS Take 1,000 mg by mouth daily.     oxyCODONE (ROXICODONE) 15 MG immediate release tablet Take 1 tablet by mouth as needed.     pantoprazole (PROTONIX) 40 MG tablet Take 40 mg by mouth as needed.     predniSONE (DELTASONE) 20 MG tablet Take 40 mg by mouth daily with breakfast. For 3 days then 1 tablet for 4 days     pregabalin (LYRICA) 25 MG capsule Take 25 mg by mouth 2 (two) times daily.     sertraline (ZOLOFT) 50 MG tablet Take 50 mg by mouth daily.     torsemide (DEMADEX) 20 MG tablet Take 1 tablet (20 mg total) by mouth every other day. Patient takes 1 tablet by mouth every other day as needed for fluid retention. 15 tablet 3   traZODone (DESYREL) 50 MG tablet Take 50 mg by mouth at bedtime.     No current facility-administered medications for this encounter.   BP (!) 144/80    Pulse (!) 59    Wt 104.1 kg (229 lb 9.6 oz)    SpO2 98%    BMI 40.03 kg/m  General: NAD, obese.  Neck: No JVD, no thyromegaly or thyroid nodule.  Lungs: Clear to auscultation bilaterally with normal respiratory effort. CV: Nondisplaced PMI.  Heart regular S1/S2, no S3/S4, 1/6 SEM RUSB.  Trace ankle edema.  No carotid bruit.  Normal pedal pulses.  Abdomen: Soft, nontender, no hepatosplenomegaly, no distention.  Skin: Intact without lesions or rashes.  Neurologic: Alert and oriented x 3.  Psych: Normal affect. Extremities: No clubbing or cyanosis.  HEENT: Normal.   Assessment/Plan: 1. Supine hypertension with orthostatic hypotension: This is difficult to manage.  Resting BP remains high today, she denies orthostatic symptoms currently though not on her feet much with patellar fracture.  Workup for secondary hypertension has been negative.  Testing has been negative for amyloidosis (concerned for autonomic neuropathy with orthostatic hypotension).  - Continue  Coreg 25 mg bid, clonidine at current dose, chlorthalidone, and hydralazine 100 tid.   - Stop amlodipine, start nifedipine CR 120 mg daily.  - Off spironolactone with AKI.  2. Chronic diastolic CHF: Echo in 2/45 with EF 55-60%, normal RV. RHC in 11/22 with optimized filling pressures.  PYP scan, myeloma studies were not suggestive of cardiac amyloidosis. She has struggled with volume overload in setting of CKD stage IV.  She is not volume overloaded on exam today though weight is up (likely caloric intake with minimal activity).  - Continue torsemide 20 mg every other day, check BMET today.   - With Medicaid, unable to get Cardiomems.  3. CKD stage IV: Check BMET today.  4. CAD: H/o occluded PDA with collaterals.  No chest pain.  - Continue ASA 81 daily.  - Continue atorvastatin, check lipids at next appt.  5. Obesity: I will refer to pharmacy clinic to see if she can get semaglutide for weight loss.   Followup 3 months with APP.   Loralie Champagne 08/19/2021

## 2021-08-19 NOTE — Telephone Encounter (Signed)
Pt referred by Dr Aundra Dubin to initiate BWL8LH therapy for weight loss. Pt has Medicaid insurance which will not cover Wegovy/Saxenda. She does have DM with most recent A1c 6.7%. She is only on insulin for her DM - Lantus 10u at bedtime and Novolog 8u TID with meals. Currently following with GI for abdominal pain, nausea, and severe chronic constipation. Previously on Victoza, stopped hospital discharge 04/20/21 with no indication why.  ? ?Called pt to discuss. She is also unsure why Victoza was discontinued. She reports not having GI issues on Victoza but is still following with GI for abdominal pain and nausea that occurs once every 2 weeks or so. Has abdominal CT tomorrow with GI. Has also established with PCP in [place] now. ? ?Advised pt that GI side effects are very common with Ozempic and would not recommend starting now given her ongoing GI issues. I provided her with my # in clinic to call if she's interested in trying Ozempic once her GI issues have resolved, otherwise she can also discuss with her PCP (would need dose decreases of her insulin that he's prescribing). She was appreciative for the phone call. ?

## 2021-08-19 NOTE — Patient Instructions (Signed)
Medication Changes: ? ?Stop amilodipine ? ?Start Nifedipine 120mg  Daily ? ?Lab Work: ? ?Labs done today, your results will be available in MyChart, we will contact you for abnormal readings. ? ? ?Testing/Procedures: ? ?none ? ?Referrals: ? ?You have been referred to [place] [facility]. They will call you to arrange your appointment. ? ?Special Instructions // Education: ? ?none ? ?Follow-Up in: 3 months  ? ?At the [facility], you and your health needs are our priority. We have a designated team specialized in the treatment of Heart Failure. This Care Team includes your primary Heart Failure Specialized Cardiologist (physician), Advanced Practice Providers (APPs- Physician Assistants and Nurse Practitioners), and Pharmacist who all work together to provide you with the care you need, when you need it.  ? ?You may see any of the following providers on your designated Care Team at your next follow up: ? ?Dr Glori Bickers ?Dr Loralie Champagne ?Darrick Grinder, NP ?Lyda Jester, PA ?Jessica Milford,NP ?Marlyce Huge, PA ?Audry Riles, PharmD ? ? ?Please be sure to bring in all your medications bottles to every appointment.  ? ?Need to Contact us: ? ?If you have any questions or concerns before your next appointment please send [place] a message through [place] or call our office at [phone].   ? ?TO LEAVE A MESSAGE FOR THE NURSE SELECT OPTION 2, PLEASE LEAVE A MESSAGE INCLUDING: ?YOUR NAME ?DATE OF BIRTH ?CALL BACK NUMBER ?REASON FOR CALL**this is important as we prioritize the call backs ? ?YOU WILL RECEIVE A CALL BACK THE SAME DAY AS LONG AS YOU CALL BEFORE 4:00 PM ? ? ?

## 2021-08-19 NOTE — Patient Outreach (Signed)
Care Coordination ? ?08/19/2021 ? ?Brandan Cutbirth ?10-13-1960 ?[id number] ? ?Called patient to notify her of need to change follow up telephone appointment from 08/26/21 to 09/01/21 due to this RNCM's vacation the week of 3/13-31/17.  ?Patient in agreement. ? ?[name], [name], CDCES ?[employer] ?Care Management Coordinator - Managed Medicaid High Risk ?[phone]  ?

## 2021-08-20 ENCOUNTER — Ambulatory Visit (HOSPITAL_BASED_OUTPATIENT_CLINIC_OR_DEPARTMENT_OTHER): Admission: RE | Admit: 2021-08-20 | Payer: Medicaid Other | Source: Ambulatory Visit

## 2021-08-25 ENCOUNTER — Telehealth: Payer: Self-pay | Admitting: Gastroenterology

## 2021-08-25 ENCOUNTER — Other Ambulatory Visit: Payer: Self-pay

## 2021-08-25 ENCOUNTER — Ambulatory Visit (INDEPENDENT_AMBULATORY_CARE_PROVIDER_SITE_OTHER): Payer: Medicaid Other | Admitting: Clinical

## 2021-08-25 DIAGNOSIS — F4321 Adjustment disorder with depressed mood: Secondary | ICD-10-CM

## 2021-08-25 DIAGNOSIS — F331 Major depressive disorder, recurrent, moderate: Secondary | ICD-10-CM | POA: Diagnosis not present

## 2021-08-25 NOTE — Plan of Care (Signed)
Client was in agreement to the plan. ?

## 2021-08-25 NOTE — Telephone Encounter (Signed)
Returned call to pt. I provided her with the central radiology scheduling phone number so that she can get her CT scan rescheduled. I told her that I will check with the pre-certification coordinators to see if there is any update on authorization status. I told the pt that I will call her with an update. Pt verbalized understanding and had no concerns at the end of the call. ?

## 2021-08-25 NOTE — Telephone Encounter (Signed)
Inbound call from patient stating she CT scan was canceled because we did not get precert for it and is wanting to rescheduled appt.  Please advise. ?
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
## 2021-08-26 ENCOUNTER — Ambulatory Visit: Payer: Medicaid Other

## 2021-08-27 ENCOUNTER — Other Ambulatory Visit: Payer: Self-pay

## 2021-08-27 DIAGNOSIS — D631 Anemia in chronic kidney disease: Secondary | ICD-10-CM | POA: Diagnosis not present

## 2021-08-27 DIAGNOSIS — E871 Hypo-osmolality and hyponatremia: Secondary | ICD-10-CM | POA: Diagnosis not present

## 2021-08-27 DIAGNOSIS — M10062 Idiopathic gout, left knee: Secondary | ICD-10-CM | POA: Diagnosis not present

## 2021-08-27 DIAGNOSIS — I639 Cerebral infarction, unspecified: Secondary | ICD-10-CM | POA: Diagnosis not present

## 2021-08-27 DIAGNOSIS — Q245 Malformation of coronary vessels: Secondary | ICD-10-CM | POA: Diagnosis not present

## 2021-08-27 DIAGNOSIS — I10 Essential (primary) hypertension: Secondary | ICD-10-CM | POA: Diagnosis not present

## 2021-08-27 DIAGNOSIS — F32A Depression, unspecified: Secondary | ICD-10-CM | POA: Diagnosis not present

## 2021-08-27 DIAGNOSIS — N1832 Chronic kidney disease, stage 3b: Secondary | ICD-10-CM | POA: Diagnosis not present

## 2021-08-27 DIAGNOSIS — I2510 Atherosclerotic heart disease of native coronary artery without angina pectoris: Secondary | ICD-10-CM | POA: Diagnosis not present

## 2021-08-27 DIAGNOSIS — I5030 Unspecified diastolic (congestive) heart failure: Secondary | ICD-10-CM | POA: Diagnosis not present

## 2021-08-27 DIAGNOSIS — E1122 Type 2 diabetes mellitus with diabetic chronic kidney disease: Secondary | ICD-10-CM | POA: Diagnosis not present

## 2021-08-27 DIAGNOSIS — I129 Hypertensive chronic kidney disease with stage 1 through stage 4 chronic kidney disease, or unspecified chronic kidney disease: Secondary | ICD-10-CM | POA: Diagnosis not present

## 2021-08-27 DIAGNOSIS — Z7689 Persons encountering health services in other specified circumstances: Secondary | ICD-10-CM | POA: Diagnosis not present

## 2021-08-27 NOTE — Patient Outreach (Signed)
Medicaid Managed Care Social Work Note  08/27/2021 Name:  Maria Hudson MRN:  [id number] DOB:  07/27/1960  [place]field Contreraz is an 61 y.o. year old female who is a primary patient of Fleet Contras, MD.  The Medicaid Managed Care Coordination team was consulted for assistance with:  [facility]   Ms. Babic was given information about Medicaid Managed Care Coordination team services today. [name] Patient agreed to services and verbal consent obtained.  Engaged with patient  for by telephone forfollow up visit in response to referral for case management and/or care coordination services.   Assessments/Interventions:  Review of past medical history, allergies, medications, health status, including review of consultants reports, laboratory and other test data, was performed as part of comprehensive evaluation and provision of chronic care management services.  SDOH: (Social Determinant of Health) assessments and interventions performed: BSW completed telephone follow up with patient. She states everything is going well and no resources are needed at this time.   Advanced Directives Status:  Not addressed in this encounter.  Care Plan                 Allergies  Allergen Reactions   Hydrocodone Itching    Patient able to tolerate when taken with Benadryl.   Oxycodone-Acetaminophen Itching    Patient able to tolerate when taken with Benadryl.    Medications Reviewed Today     Reviewed by Suezanne Cheshire, RN (Registered Nurse) on 08/19/21 at 0940  Med List Status: <None>   Medication Order Taking? Sig Documenting Provider Last Dose Status Informant  Accu-Chek Softclix Lancets lancets [id number] Yes 3 (three) times daily. [provider] Taking Active   acetaminophen (TYLENOL) 500 MG tablet [id number] Yes Take 1,000 mg by mouth every 6 (six) hours as needed for moderate pain or headache. [provider] Taking Active Self  allopurinol (ZYLOPRIM) 100 MG tablet  [id number] Yes Take 100 mg by mouth daily. [provider] Taking Active Self  amLODipine (NORVASC) 10 MG tablet [id number] Yes Take 10 mg by mouth every morning. [provider] Taking Active Self  aspirin EC 81 MG tablet [id number] Yes Take 81 mg by mouth daily. Swallow whole. [provider] Taking Active Self           Med Note [name]   Mon Apr 14, 2021  9:22 PM)    atorvastatin (LIPITOR) 40 MG tablet [id number] Yes Take 1 tablet (40 mg total) by mouth every evening. [place] Si, MD Taking Active   buPROPion [facility]) 100 MG tablet [id number] Yes Take 100 mg by mouth 2 (two) times daily. [provider] Taking Active Self  carvedilol (COREG) 25 MG tablet [id number] Yes Take 1 tablet (25 mg total) by mouth 2 (two) times daily with a meal. [name], York Ram, MD Taking Active Self  chlorthalidone (HYGROTON) 25 MG tablet [id number] Yes Take 1 tablet (25 mg total) by mouth daily. [place] Si, MD Taking Active   cloNIDine (CATAPRES - DOSED IN MG/24 HR) 0.3 mg/24hr patch [id number] Yes Place 1 patch (0.3 mg total) onto the skin once a week. [place] Si, MD Taking Active   diclofenac Sodium (VOLTAREN) 1 % GEL [id number] Yes Apply 2 g topically as needed. [provider] Taking Active   dicyclomine (BENTYL) 10 MG capsule [id number] Yes Take 10 mg by mouth as needed. [provider] Taking Active   ferrous sulfate 325 (65 FE) MG tablet [id number] Yes Take 325 mg by mouth 2 (two) times daily. [provider] Taking Active   fluticasone (FLONASE) 50 MCG/ACT nasal spray [id number] Yes Place 1 spray into both nostrils daily as needed for allergies or rhinitis. [provider] Taking Active Self  hydrALAZINE (APRESOLINE) 100 MG tablet [id number] Yes Take 100 mg by mouth 3 (three) times daily. [provider] Taking Active   insulin glargine (LANTUS) 100 unit/mL SOPN [id number] Yes Inject 10 Units into the skin  at bedtime. [provider] Taking Active Self           Med Note Suezanne Cheshire   Tue Aug 19, 2021  9:37 AM)    isosorbide mononitrate (IMDUR) 60 MG 24 hr tablet [id number] Yes Take 1.5 tablets (90 mg total) by mouth daily. [place], Anderson Malta, FNP Taking Active   loratadine (CLARITIN) 10 MG tablet [id number] Yes Take 10 mg by mouth daily. [provider] Taking Active Self  Multiple Vitamin (MULTIVITAMIN ADULT) TABS [id number] Yes Take 1 tablet by mouth daily. [provider] Taking Active Self  nitroGLYCERIN (NITROSTAT) 0.4 MG SL tablet [id number] Yes Place 1 tablet (0.4 mg total) under the tongue every 5 (five) minutes as needed for chest pain. [name], York Ram, MD Taking Active Self           Med Note Kittie Plater, Smitty Cords   Tue Aug 19, 2021  9:35 AM)    Rubbie Battiest FLEXPEN 100 UNIT/ML FlexPen [id number] Yes Inject 8 Units into the skin 3 (three) times daily with meals. [provider] Taking Active Self           Med Note Raynelle Chary   Tue May 06, 2021  5:01 PM)    Omega-3 Fatty Acids (FISH OIL) 1000 MG CAPS [id number] Yes Take 1,000 mg by mouth daily. [provider] Taking Active Self  oxyCODONE (ROXICODONE) 15 MG immediate release tablet [id number] Yes Take 1 tablet by mouth as needed. [provider] Taking Active   pantoprazole (PROTONIX) 40 MG tablet [id number] Yes Take 40 mg by mouth as needed. [provider] Taking Active   predniSONE (DELTASONE) 20 MG tablet [id number] Yes Take 40 mg by mouth daily with breakfast. For 3 days then 1 tablet for 4 days [provider] Taking Active   pregabalin (LYRICA) 25 MG capsule [id number] Yes Take 25 mg by mouth 2 (two) times daily. [provider] Taking Active Self  sertraline (ZOLOFT) 50 MG tablet [id number] Yes Take 50 mg by mouth daily. [provider] Taking Active Self  torsemide (DEMADEX) 20 MG tablet [id number] Yes Take 1 tablet (20 mg total) by  mouth every other day. Patient takes 1 tablet by mouth every other day as needed for fluid retention. [place], Anderson Malta, FNP Taking Active   traZODone (DESYREL) 50 MG tablet [id number] Yes Take 50 mg by mouth at bedtime. [provider] Taking Active Self            Patient Active Problem List   Diagnosis Date Noted   OSA (obstructive sleep apnea) 05/27/2021   Pure hypercholesterolemia 05/27/2021   Chronic diastolic CHF (congestive heart failure) (HCC) 04/29/2021   Hypertensive urgency 04/14/2021   Acute on chronic diastolic CHF (congestive heart failure) (HCC) 04/14/2021   Chest pain 03/05/2021   CAD (coronary artery disease) 03/05/2021   T2DM (type 2 diabetes mellitus) (HCC) 03/05/2021   CKD (chronic kidney disease) stage 4, GFR 15-29 ml/min (HCC) 03/05/2021   Class 3 obesity (HCC) 03/05/2021   CVA (cerebral vascular accident) (HCC) 03/05/2021   Acute on chronic  diastolic heart failure (HCC) 03/05/2021   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   AKI (acute kidney injury) (HCC) 03/05/2021   Vitreous hemorrhage of left eye (HCC) 11/14/2020   Orthopnea 08/06/2020   Vision loss of right eye 08/06/2020   Excessive daytime sleepiness 01/11/2020   Iron deficiency anemia 12/14/2019   Vitreous hemorrhage, right (HCC) 12/04/2019   Diabetic neuropathy (HCC) 07/26/2019   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) 02/03/2019   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   Conductive hearing loss, bilateral 03/03/2018   Chronic bilateral low back pain without sciatica 08/21/2015   Chronic kidney disease (CKD) stage G3b/A3, moderately decreased glomerular filtration rate (GFR) between 30-44 mL/min/1.73 square meter and albuminuria creatinine ratio greater than 300 mg/g (HCC) 04/30/2015   Type 2 diabetes mellitus with diabetic polyneuropathy, with long-term current use of insulin (HCC) 02/15/2015   Advance directive on file  05/17/2014   Refusal of blood transfusions as patient is Jehovah's Witness 05/17/2014   Coronary artery disease 07/07/2013   Severe obesity (BMI >= 40) (HCC) 05/18/2013   Neuropathy 04/11/2013   Diastolic heart failure (HCC) 10/16/2012   Essential hypertension 10/16/2012    Conditions to be addressed/monitored per PCP order:   CAP  There are no care plans that you recently modified to display for this patient.   Follow up:  Patient agrees to Care Plan and Follow-up.  Plan: The Managed Medicaid care management team will reach out to the patient again over the next 45 days.  Date/time of next scheduled Social Work care management/care coordination outreach:  10/17/21  Gus Puma, Kenard Gower, [facility]  [facility]  High Risk Managed Medicaid Team  [phone]

## 2021-08-27 NOTE — Telephone Encounter (Signed)
CT has been approved. Approval #[id number] ? ?Called and informed pt that CT has been approved. I provided her with the approval #. She will call central scheduling to set up her appt. Pt verbalized understanding and had no concerns at the end of the call. ?

## 2021-08-27 NOTE — Patient Instructions (Signed)
Visit Information ? ?Ms. Rohde was given information about Medicaid Managed Care team care coordination services as a part of their [facility] Medicaid benefit. Russell Engelstad verbally consented to engagement with the [facility] Managed Care team.  ? ?If you are experiencing a medical emergency, please call 911 or report to your local emergency department or urgent care.  ? ?If you have a non-emergency medical problem during routine business hours, please contact your provider's office and ask to speak with a nurse.  ? ?For questions related to your [facility] health plan, please call: [phone] or go here:[email or web address] ? ?If you would like to schedule transportation through your [facility] plan, please call the following number at least 2 days in advance of your appointment: [phone]. ? You can also use the MTM portal or MTM mobile app to manage your rides. For the portal, please go to [email or web address]. ? ?Call the [place] at [phone], at any time, 24 hours a day, 7 days a week. If you are in danger or need immediate medical attention call 911. ? ?If you would like help to quit smoking, call 1-800-QUIT-NOW 805-562-2707) OR Espa?ol: 1-855-D?jelo-Ya 514-494-1281) o para m?s informaci?n haga clic aqu? or Text READY to 200-400 to register via text ? ?Ms. Teed - following are the goals we discussed in your visit today:  ? Goals Addressed   ?None ?  ? ? ? ?Social Worker will follow up in 45 days .  ? ?Mickel Fuchs, BSW,  ?[place]  ?High Risk Managed Medicaid Team  ?[phone]  ? ?Following is a copy of your plan of care:  ?There are no care plans that you recently modified to display for this patient. ?  ?

## 2021-08-28 ENCOUNTER — Ambulatory Visit: Payer: Medicaid Other

## 2021-08-28 DIAGNOSIS — M25561 Pain in right knee: Secondary | ICD-10-CM | POA: Diagnosis not present

## 2021-08-28 DIAGNOSIS — Z7689 Persons encountering health services in other specified circumstances: Secondary | ICD-10-CM | POA: Diagnosis not present

## 2021-08-29 ENCOUNTER — Other Ambulatory Visit: Payer: Self-pay

## 2021-08-29 ENCOUNTER — Ambulatory Visit (HOSPITAL_BASED_OUTPATIENT_CLINIC_OR_DEPARTMENT_OTHER)
Admission: RE | Admit: 2021-08-29 | Discharge: 2021-08-29 | Disposition: A | Discharge status: Home / Self Care | Payer: Medicaid Other | Source: Ambulatory Visit | Attending: Gastroenterology | Admitting: Gastroenterology

## 2021-08-29 DIAGNOSIS — K5909 Other constipation: Secondary | ICD-10-CM | POA: Diagnosis not present

## 2021-08-29 DIAGNOSIS — N3289 Other specified disorders of bladder: Secondary | ICD-10-CM | POA: Diagnosis not present

## 2021-08-29 DIAGNOSIS — R110 Nausea: Secondary | ICD-10-CM | POA: Diagnosis not present

## 2021-08-29 DIAGNOSIS — K8020 Calculus of gallbladder without cholecystitis without obstruction: Secondary | ICD-10-CM | POA: Diagnosis not present

## 2021-08-29 DIAGNOSIS — R1013 Epigastric pain: Secondary | ICD-10-CM | POA: Diagnosis not present

## 2021-08-29 DIAGNOSIS — K5900 Constipation, unspecified: Secondary | ICD-10-CM | POA: Diagnosis not present

## 2021-09-01 ENCOUNTER — Ambulatory Visit (HOSPITAL_COMMUNITY): Payer: Medicaid Other | Admitting: Student in an Organized Health Care Education/Training Program

## 2021-09-01 ENCOUNTER — Encounter (HOSPITAL_COMMUNITY): Payer: Self-pay

## 2021-09-01 ENCOUNTER — Other Ambulatory Visit: Payer: Self-pay | Admitting: *Deleted

## 2021-09-01 NOTE — Patient Instructions (Signed)
Visit Information ? ?Ms. Hinderliter was given information about Medicaid Managed Care team care coordination services as a part of their [facility] Medicaid benefit. Aylani Spurlock verbally consented to engagement with the [facility] Managed Care team.  ? ?If you are experiencing a medical emergency, please call 911 or report to your local emergency department or urgent care.  ? ?If you have a non-emergency medical problem during routine business hours, please contact your provider's office and ask to speak with a nurse.  ? ?For questions related to your [facility] health plan, please call: [phone] or go here:[email or web address] ? ?If you would like to schedule transportation through your [facility] plan, please call the following number at least 2 days in advance of your appointment: [phone]. ? You can also use the MTM portal or MTM mobile app to manage your rides. For the portal, please go to [email or web address]. ? ?Call the [place] at [phone], at any time, 24 hours a day, 7 days a week. If you are in danger or need immediate medical attention call 911. ? ?If you would like help to quit smoking, call 1-800-QUIT-NOW 631 102 5374) OR Espa?ol: 1-855-D?jelo-Ya 954-609-5568) o para m?s informaci?n haga clic aqu? or Text READY to 200-400 to register via text ? ?Ms. Mckamey - following are the goals we discussed in your visit today:  ? Goals Addressed   ?Take medications as prescribed   ?Attend all scheduled provider appointments ?Call pharmacy for medication refills 3-7 days in advance of running out of medications ?Attend church or other social activities ?Perform all self care activities independently  ?Call provider office for new concerns or questions  ?Work with the Education officer, museum to address care coordination needs and will continue to work with the clinical team to address health care and disease management related needs ?call office if I gain more than 2 pounds in  one day or 5 pounds in one week ?keep legs up while sitting ?track weight in diary ?use salt in moderation ?watch for swelling in feet, ankles and legs every day ?weigh myself daily ?know when to call the doctorfor fluid weight gain or consistently abnormal BP readings at home ?track symptoms and what helps feel better or worse ?check blood sugar at prescribed times: three times daily ?enter blood sugar readings and medication or insulin into daily log ?take the blood sugar log to all doctor visits ?take the blood sugar meter to all doctor visits ?eat fish at least once per week ?fill half of plate with vegetables ?manage portion size ?check blood pressure daily ?write blood pressure results in a log or diary ?learn about high blood pressure ?take blood pressure log to all doctor appointments ?call doctor for signs and symptoms of high blood pressure ?keep all doctor appointments ?take medications for blood pressure exactly as prescribed ?Limit fluid intake to 1 liter per day ?Collaborate with managed Medicaid team of LCSW, BSW and pharmacist to address identified needs ?Work with cardiology pharmacist to assist with BP stabilization ?Work with [facility] LCSW to treat depression ?Discuss RX with GLP1 agonist Rx with providers to assist with weight management , glycemic control and to reduce CV risks ?Use [facility] Management calendar to record weight , BP and CBGs so readings are all in one place ? ? ? ?Patient verbalizes understanding of instructions and care plan provided today and agrees to view in [place]. Active MyChart status confirmed with patient.   ? ?The Managed Medicaid care management team will reach out to  the patient again over the next 30 days.  ? ?[name], [name], CDCES ?[place] [facility] ?Care Management Coordinator - Managed Medicaid High Risk ?[phone]  ? ?Following is a copy of your plan of care:  ?Care Plan : [facility]  ?Updates  made by [name], RN since 09/01/2021 12:00 AM  ?  ? ?Problem: Knowledge Deficit and Care Coordination Needs Related to Management of HTN, HF, Type 2 DM   ?Priority: High  ?  ? ?Long-Range Goal: Development of Plan of Care to Address Knowledge Deficits and Care Coordination Needs related to  management of CHF, CAD, HTN, HLD, DMII, CKD Stage 4, and Depression   ?Start Date: 05/06/2021  ?Expected End Date: 05/06/2022  ?Priority: High  ?Note:   ?Current Barriers:  ?Knowledge Deficits related to plan of care for management of CHF, CAD, HTN, HLD, DMII, CKD Stage 4, and Depression: depressed mood ?loss of energy/fatigue ?insomnia ?disturbed sleep ?decreased appetite ?difficulty concentrating ?recurrent thoughts of death  ?Care Coordination needs related to Housing barriers and Mental Health Concerns  and personal care services ?Patient says she saw her primary care provider Dr. Jeanie Cooks on 08/04/21- says no medication changes were made and she was referred to [facility]) to assess and treat her depression and hx of recurrent thoughts of death ?Patient says since she fell on 2/7 at home and fractured her right knee cap she is gaining weight due to inactivity and would like to go back on Victoza or something similar to help her with weight loss. Says she met with a pharmacist on 08/19/21 at Dr Claris Gladden recommendations in regards to starting Ozempic and the pharmacist advised her that GI side effects are very common with Ozempic and would not recommend starting Ozempic now given her ongoing GI issues. ?Says she had an abdominal CT but has not reviewed the results. Says she is still having intermittent right chest and abdominal pain. She says her constipation is improved with moringa supplements   ?Chart review reveals patient saw [facility] LCSW on 3/13 ?RNCM Clinical Goal(s):  ?Patient will demonstrate ongoing adherence to prescribed treatment plan for CHF, CAD, HTN, HLD, DMII, CKD Stage 4,  and Depression as evidenced by patient report of improved health and quality of life and no unplanned ED or unplanned hospital admissions ?continue to work with RN Care Manager and/or Social Worker to address care management and care coordination needs related to CHF, CAD, HTN, HLD, DMII, CKD Stage 4, and Depression as evidenced by adherence to CM Team Scheduled appointments      ?work with managed Medicaid BSW to address needs related to housing barriers as evidenced by patient receiving resources from [employer] for low income housing options in [place] and to apply for personal care services- patient says she was told by the managed Medicaid BSW that she does not qualify for PCS or CAP ?Work with [facility] LCSW to treat depression ? ?Interventions: ?Inter-disciplinary care team collaboration (see longitudinal plan of care) ?Evaluation of current treatment plan related to  self management and patient's adherence to plan as established by provider ?  ?  ?Heart Failure Interventions:  (Status: Goal on Track (progressing): YES.)  Long Term Goal - patient states her fluid retention has significantly improved,  says she cannot tolerate the compression stockings because they are too tight and make her legs hurt ?Assessed patient's heart failure management strategies ? ?Diabetes:  (Status: Goal on Track (progressing):  YES.) Long Term Goal - patient says her CBGS have been quite variable and she is not sure why, denies hypoglycemia or sustained hyperglycemia ? ?Lab Results  ?Component Value Date  ? HGBA1C 6.7 (H) 04/29/2021  ?Assessed patient's understanding of A1C goal: <7% ?Reviewed medications with patient and discussed importance of medication adherence;        ?Counseled on importance of regular laboratory monitoring as prescribed;        ?Discussed plans with patient for ongoing care management follow up and provided patient with direct contact information for care management team;      ?Advised patient, providing education  and rationale, to check cbg three times daily and record        ?call provider for findings outside established parameters;       ?Review of patient status, including review of consultants reports, rel

## 2021-09-01 NOTE — Patient Outreach (Signed)
?Medicaid Managed Care   ?Nurse Care Manager Note ? ?09/01/2021 ?Name:  Maria Hudson MRN:  [id number] DOB:  [birth year] ? ?Maria Hudson is an 61 y.o. year old female who is a primary patient of Maria Ebbs, MD.  The [facility] Managed Care Coordination team was consulted for assistance with:    ?CAD ?HTN ?HLD ?DM ?CKD Stage4 ?Depression ?Obesity ? ?Maria Hudson was given information about Medicaid Managed Care Coordination team services today. Maria Hudson Patient agreed to services and verbal consent obtained. ? ?[name] for follow up visit in response to provider referral for case management and/or care coordination services.  ? ?Assessments/Interventions:  Review of past medical history, allergies, medications, health status, including review of consultants reports, laboratory and other test data, was performed as part of comprehensive evaluation and provision of chronic care management services. ? ?SDOH (Social Determinants of Health) assessments and interventions performed: ? ? ?Care Plan ? ?Allergies  ?Allergen Reactions  ? Hydrocodone Itching  ?  Patient able to tolerate when taken with Benadryl.  ? Oxycodone-Acetaminophen Itching  ?  Patient able to tolerate when taken with Benadryl.  ? ? ?Medications Reviewed Today   ? ? Reviewed by Maria Ellison, RN (Registered Nurse) on 09/01/21 at [facility] List Status: <None>  ? ?Medication Order Taking? Sig Documenting Provider Last Dose Status Informant  ?Accu-Chek Softclix Lancets lancets [id number] No 3 (three) times daily. [provider] Taking Active   ?acetaminophen (TYLENOL) 500 MG tablet [id number] No Take 1,000 mg by mouth every 6 (six) hours as needed for moderate pain or headache. [provider] Taking Active Self  ?allopurinol (ZYLOPRIM) 100 MG tablet [id number] No Take 100 mg by mouth daily. [provider] Taking Active Self  ?aspirin EC 81 MG tablet [id number] No Take 81 mg by mouth daily. Swallow  whole. [provider] Taking Active Self  ?         ?Med Note Fransico Meadow Apr 14, 2021  9:22 PM)    ?atorvastatin (LIPITOR) 40 MG tablet [id number] No Take 1 tablet (40 mg total) by mouth every evening. Skeet Latch, MD Taking Active   ?buPROPion (WELLBUTRIN) 100 MG tablet [id number] No Take 100 mg by mouth 2 (two) times daily. [provider] Taking Active Self  ?carvedilol (COREG) 25 MG tablet [id number] No Take 1 tablet (25 mg total) by mouth 2 (two) times daily with a meal. [name], [name], MD Taking Active Self  ?chlorthalidone (HYGROTON) 25 MG tablet [id number] No Take 1 tablet (25 mg total) by mouth daily. Skeet Latch, MD Taking Active   ?cloNIDine (CATAPRES - DOSED IN MG/24 HR) 0.3 mg/24hr patch [id number] No Place 1 patch (0.3 mg total) onto the skin once a week. Skeet Latch, MD Taking Active   ?diclofenac Sodium (VOLTAREN) 1 % GEL [id number] No Apply 2 g topically as needed. [provider] Taking Active   ?dicyclomine (BENTYL) 10 MG capsule [id number] No Take 10 mg by mouth as needed. [provider] Taking Active   ?ferrous sulfate 325 (65 FE) MG tablet [id number] No Take 325 mg by mouth 2 (two) times daily. [provider] Taking Active   ?fluticasone (FLONASE) 50 MCG/ACT nasal spray [id number] No Place 1 spray into both nostrils daily as needed for allergies or rhinitis. [provider] Taking Active Self  ?hydrALAZINE (APRESOLINE) 100 MG tablet [id number] No Take 100 mg by mouth 3 (three) times daily. [provider] Taking Active   ?insulin  glargine (LANTUS) 100 unit/mL SOPN [id number] No Inject 10 Units into the skin at bedtime. [provider] Taking Active Self  ?         ?Med Note [facility] [place]   Tue Aug 19, 2021  9:37 AM)    ?isosorbide mononitrate (IMDUR) 60 MG 24 hr tablet [id number] No Take 1.5 tablets (90 mg total) by mouth daily. Elnora, Maricela Bo, FNP Taking Active   ?loratadine  (CLARITIN) 10 MG tablet [id number] No Take 10 mg by mouth daily. [provider] Taking Active Self  ?Multiple Vitamin (MULTIVITAMIN ADULT) TABS [id number] No Take 1 tablet by mouth daily. [provider] Taking Active Self  ?NIFEdipine (ADALAT CC) 60 MG 24 hr tablet [id number]  Take 2 tablets (120 mg total) by mouth daily. Larey Dresser, MD  Active   ?nitroGLYCERIN (NITROSTAT) 0.4 MG SL tablet [id number] No Place 1 tablet (0.4 mg total) under the tongue every 5 (five) minutes as needed for chest pain. [name], [name], MD Taking Active Self  ?         ?Med Note [facility] [place]   Tue Aug 19, 2021  9:35 AM)    ?NOVOLOG FLEXPEN 100 UNIT/ML FlexPen [id number] No Inject 8 Units into the skin 3 (three) times daily with meals. [provider] Taking Active Self  ?         ?Med Note Anabel Bene R   Tue May 06, 2021  5:01 PM)    ?Omega-3 Fatty Acids (FISH OIL) 1000 MG CAPS [id number] No Take 1,000 mg by mouth daily. [provider] Taking Active Self  ?oxyCODONE (ROXICODONE) 15 MG immediate release tablet [id number] No Take 1 tablet by mouth as needed. [provider] Taking Active   ?pantoprazole (PROTONIX) 40 MG tablet [id number] No Take 40 mg by mouth as needed. [provider] Taking Active   ?predniSONE (DELTASONE) 20 MG tablet [id number] No Take 40 mg by mouth daily with breakfast. For 3 days then 1 tablet for 4 days [provider] Taking Active   ?pregabalin (LYRICA) 25 MG capsule [id number] No Take 25 mg by mouth 2 (two) times daily. [provider] Taking Active Self  ?sertraline (ZOLOFT) 50 MG tablet [id number] No Take 50 mg by mouth daily. [provider] Taking Active Self  ?torsemide (DEMADEX) 20 MG tablet [id number] No Take 1 tablet (20 mg total) by mouth every other day. Patient takes 1 tablet by mouth every other day as needed for fluid retention. [place], Maricela Bo, FNP Taking Active   ?traZODone (DESYREL) 50 MG tablet  [id number] No Take 50 mg by mouth at bedtime. [provider] Taking Active Self  ? ?  ?  ? ?  ? ? ?Patient Active Problem List  ? Diagnosis Date Noted  ? OSA (obstructive sleep apnea) 05/27/2021  ? Pure hypercholesterolemia 05/27/2021  ? Chronic diastolic CHF (congestive heart failure) ([place]) 04/29/2021  ? Hypertensive urgency 04/14/2021  ? Acute on chronic diastolic CHF (congestive heart failure) ([place]) 04/14/2021  ? Chest pain 03/05/2021  ? CAD (coronary artery disease) 03/05/2021  ? T2DM (type 2 diabetes mellitus) ([place]) 03/05/2021  ? CKD (chronic kidney disease) stage 4, GFR 15-29 ml/min (HCC) 03/05/2021  ? Class 3 obesity ([place]) 03/05/2021  ? CVA (cerebral vascular accident) ([place]) 03/05/2021  ? Acute on chronic diastolic heart failure ([place]) 03/05/2021  ? 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 AKI (acute kidney injury) ([place]) 03/05/2021  ? Vitreous hemorrhage of left eye ([place]) 11/14/2020  ? Orthopnea  08/06/2020  ? Vision loss of right eye 08/06/2020  ? Excessive daytime sleepiness 01/11/2020  ? Iron deficiency anemia 12/14/2019  ? Vitreous hemorrhage, right ([place]) 12/04/2019  ? Diabetic neuropathy ([place]) 07/26/2019  ? Proliferative diabetic retinopathy of both 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 ([place]) 02/03/2019  ? 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 Conductive hearing loss, bilateral 03/03/2018  ? Chronic bilateral low back pain without sciatica 08/21/2015  ? Chronic kidney disease (CKD) stage G3b/A3, moderately decreased glomerular filtration rate (GFR) between 30-44 mL/min/1.73 square meter and albuminuria creatinine ratio greater than 300 mg/g (HCC) 04/30/2015  ? Type 2 diabetes mellitus with diabetic polyneuropathy, with long-term current use of insulin ([place]) 02/15/2015  ? Advance directive on file 05/17/2014  ? Refusal of blood transfusions as patient is Jehovah's Witness 05/17/2014  ? Coronary artery disease 07/07/2013  ? Severe obesity (BMI >= 40) ([place]) 05/18/2013  ?  Neuropathy 04/11/2013  ? Diastolic heart failure ([place]) 10/16/2012  ? Essential hypertension 10/16/2012  ? ? ? ? ?Care Plan : RN Care Manager Plan Of Care  ?Updates made by Maria Ellison, RN since 09/01/2021 12:00 AM

## 2021-09-02 ENCOUNTER — Other Ambulatory Visit: Payer: Self-pay

## 2021-09-02 ENCOUNTER — Ambulatory Visit (HOSPITAL_BASED_OUTPATIENT_CLINIC_OR_DEPARTMENT_OTHER): Payer: Medicaid Other | Attending: Cardiovascular Disease | Admitting: Cardiovascular Disease

## 2021-09-02 DIAGNOSIS — G4733 Obstructive sleep apnea (adult) (pediatric): Secondary | ICD-10-CM | POA: Insufficient documentation

## 2021-09-03 ENCOUNTER — Encounter: Payer: Self-pay | Admitting: Gastroenterology

## 2021-09-03 ENCOUNTER — Ambulatory Visit: Payer: Medicaid Other | Admitting: Gastroenterology

## 2021-09-03 ENCOUNTER — Encounter (HOSPITAL_COMMUNITY): Payer: Self-pay

## 2021-09-03 ENCOUNTER — Ambulatory Visit (HOSPITAL_COMMUNITY)
Admission: EM | Admit: 2021-09-03 | Discharge: 2021-09-03 | Disposition: A | Discharge status: Home / Self Care | Payer: Medicaid Other | Attending: Family Medicine | Admitting: Family Medicine

## 2021-09-03 ENCOUNTER — Other Ambulatory Visit: Payer: Self-pay

## 2021-09-03 VITALS — BP 158/72 | HR 65 | Ht 63.0 in | Wt 228.0 lb

## 2021-09-03 DIAGNOSIS — R1013 Epigastric pain: Secondary | ICD-10-CM

## 2021-09-03 DIAGNOSIS — I10 Essential (primary) hypertension: Secondary | ICD-10-CM | POA: Diagnosis not present

## 2021-09-03 DIAGNOSIS — L089 Local infection of the skin and subcutaneous tissue, unspecified: Secondary | ICD-10-CM | POA: Diagnosis not present

## 2021-09-03 DIAGNOSIS — L720 Epidermal cyst: Secondary | ICD-10-CM

## 2021-09-03 DIAGNOSIS — R0789 Other chest pain: Secondary | ICD-10-CM

## 2021-09-03 DIAGNOSIS — K8020 Calculus of gallbladder without cholecystitis without obstruction: Secondary | ICD-10-CM | POA: Diagnosis not present

## 2021-09-03 DIAGNOSIS — R110 Nausea: Secondary | ICD-10-CM | POA: Diagnosis not present

## 2021-09-03 DIAGNOSIS — I5032 Chronic diastolic (congestive) heart failure: Secondary | ICD-10-CM | POA: Diagnosis not present

## 2021-09-03 DIAGNOSIS — Z7689 Persons encountering health services in other specified circumstances: Secondary | ICD-10-CM | POA: Diagnosis not present

## 2021-09-03 MED ORDER — DOXYCYCLINE HYCLATE 100 MG PO CAPS: 100.0000 mg | ORAL_CAPSULE | Freq: Two times a day (BID) | ORAL | 0 refills | Status: DC

## 2021-09-03 MED ORDER — LIDOCAINE-EPINEPHRINE 1 %-1:100000 IJ SOLN
INTRAMUSCULAR | Status: AC
  Filled 2021-09-03: qty 1

## 2021-09-03 NOTE — ED Triage Notes (Signed)
Pt c/o abscess to upper center back x3 wks. Denies drainage. Noted a raised area with tender to touch with heat. ?

## 2021-09-03 NOTE — ED Provider Notes (Signed)
?[place] ? ? ?[id number] ?09/03/21 Arrival Time: [phone] ? ?ASSESSMENT & PLAN: ? ?1. Infected epidermoid cyst   ?2. Elevated blood pressure reading in office with diagnosis of hypertension   ? ? ?Incision and Drainage Procedure Note ? ?Anesthesia: 1% lidocaine with epinephrine ? ?Procedure Details  ?The procedure, risks and complications have been discussed in detail (including, but not limited to pain and bleeding) with the patient. ? ?The skin induration was prepped and draped in the usual fashion. ?After adequate local anesthesia, I&D with a #11 blade was performed on the midline upper back with purulent, cystic  drainage. ? ?EBL: minimal ?Drains: none ?Packing: iodoform ?Condition: Tolerated procedure well ?Complications: none. ? ?Meds ordered this encounter  ?Medications  ? doxycycline (VIBRAMYCIN) 100 MG capsule  ?  Sig: Take 1 capsule (100 mg total) by mouth 2 (two) times daily.  ?  Dispense:  14 capsule  ?  Refill:  0  ? ? ?Wound care instructions discussed and given in written format. To return in 48 hours for wound check. ? ?Finish all antibiotics. OTC analgesics as needed. ? ? ? ?Discharge Instructions   ? ?  ?If not allergic, you may use over the counter ibuprofen or acetaminophen as needed. ? ?Your blood pressure was noted to be elevated during your visit today. If you are currently taking medication for high blood pressure, please ensure you are taking this as directed. If you do not have a history of high blood pressure and your blood pressure remains persistently elevated, you may need to begin taking a medication at some point. You may return here within the next few days to recheck if unable to see your primary care provider or if you do not have a one. ? ?BP (!) 196/75 (BP Location: Right Arm)   Pulse 70   Temp 98 ?F (36.7 ?C) (Oral)   Resp 20   SpO2 91%  ? ?BP Readings from Last 3 Encounters:  ?09/03/21 (!) 196/75  ?09/03/21 (!) 158/72  ?08/19/21 (!) 144/80  ? ? ? ? ? ? ? ?Reviewed  expectations re: course of current medical issues. Questions answered. ?Outlined signs and symptoms indicating need for more acute intervention. ?Patient verbalized understanding. ?After Visit Summary given. ? ? ?SUBJECTIVE: ? ?Maria Hudson is a 61 y.o. female who presents with a possible infection of her upper back. Onset gradual, approximately several days ago without active drainage and without active bleeding. Symptoms have gradually worsened since beginning. Fever: absent. OTC/home treatment: none. ? ?Increased blood pressure noted today. Reports that she is been treated for hypertension in the past. ? ?She reports taking medications as instructed, no chest pain on exertion, and no dyspnea on exertion. ? ?OBJECTIVE: ? ?Vitals:  ? 09/03/21 1059  ?BP: (!) 196/75  ?Pulse: 70  ?Resp: 20  ?Temp: 98 ?F (36.7 ?C)  ?TempSrc: Oral  ?SpO2: 91%  ?  ? ?General appearance: alert; no distress ?Back: approx 1.5 cm induration of her upper midline back; tender to touch; no active drainage or bleeding ?Psychological: alert and cooperative; normal mood and affect ? ?Allergies  ?Allergen Reactions  ? Hydrocodone Itching  ?  Patient able to tolerate when taken with Benadryl.  ? Oxycodone-Acetaminophen Itching  ?  Patient able to tolerate when taken with Benadryl.  ? ? ?Past Medical History:  ?Diagnosis Date  ? Acute on chronic diastolic heart failure ([place]) 03/05/2021  ? Anemia   ? Arthritis   ? CAD (coronary artery disease)   ? CHF (  congestive heart failure) ([place])   ? CKD (chronic kidney disease) stage 4, GFR 15-29 ml/min (HCC) 03/05/2021  ? Colon polyps   ? CVA (cerebral vascular accident) [facility])   ? Depression   ? Diabetes mellitus without complication ([place])   ? Hypertension   ? Hypertensive urgency 04/14/2021  ? OSA (obstructive sleep apnea) 05/27/2021  ? Pneumonia   ? Pure hypercholesterolemia 05/27/2021  ? Renal disorder   ? 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 Sleep apnea   ? Stroke [facility])   ? ?Social History   ? ?Socioeconomic History  ? Marital status: Divorced  ?  Spouse name: Not on file  ? Number of children: 2  ? Years of education: Not on file  ? Highest education level: Some college, no degree  ?Occupational History  ? Occupation: retired  ? Occupation: disabled  ?Tobacco Use  ? Smoking status: Never  ?  Passive exposure: Never  ? Smokeless tobacco: Never  ?Vaping Use  ? Vaping Use: Never used  ?Substance and Sexual Activity  ? Alcohol use: Not Currently  ? Drug use: Not Currently  ? Sexual activity: Not on file  ?Other Topics Concern  ? Not on file  ?Social History Narrative  ? Not on file  ? ?Social Determinants of Health  ? ?Financial Resource Strain: Low Risk   ? Difficulty of Paying Living Expenses: Not very hard  ?Food Insecurity: No Food Insecurity  ? Worried About Charity fundraiser in the Last Year: Never true  ? Ran Out of Food in the Last Year: Never true  ?Transportation Needs: No Transportation Needs  ? Lack of Transportation (Medical): No  ? Lack of Transportation (Non-Medical): No  ?Physical Activity: Inactive  ? Days of Exercise per Week: 0 days  ? Minutes of Exercise per Session: 0 min  ?Stress: Stress Concern Present  ? Feeling of Stress : Very much  ?Social Connections: Moderately Isolated  ? Frequency of Communication with Friends and Family: More than three times a week  ? Frequency of Social Gatherings with Friends and Family: More than three times a week  ? Attends Religious Services: More than 4 times per year  ? Active Member of Clubs or Organizations: No  ? Attends Archivist Meetings: Never  ? Marital Status: Divorced  ? ?Family History  ?Problem Relation Age of Onset  ? Hypertension Mother   ? Stroke Mother   ? Diabetes Mother   ? Other Mother   ?     enlarged heart  ? Hypertension Father   ? Hypertension Sister   ? Diabetes Sister   ? CAD Sister   ?     enlarged heart  ? Kidney disease Sister   ? Diabetes Paternal Grandmother   ? Hypertension Daughter   ? Diabetes Daughter    ? Hypertension Son   ? Diabetes Son   ? Breast cancer Paternal Aunt   ? ?Past Surgical History:  ?Procedure Laterality Date  ? ABDOMINAL HYSTERECTOMY    ? CESAREAN SECTION    ? INNER EAR SURGERY Bilateral   ? RIGHT HEART CATH N/A 05/14/2021  ? Procedure: RIGHT HEART CATH;  Surgeon: Larey Dresser, MD;  Location: [facility];  Service: Cardiovascular;  Laterality: N/A;  ? TONSILLECTOMY    ? age 72  ? ? ? ? ? ? ? ? ?  ?Vanessa Kick, MD ?09/03/21 1147 ? ?

## 2021-09-03 NOTE — Patient Instructions (Signed)
If you are age 60 or older, your body mass index should be between 23-30. Your Body mass index is 40.39 kg/m?Marland Kitchen If this is out of the aforementioned range listed, please consider follow up with your Primary Care Provider. ? ?If you are age 69 or younger, your body mass index should be between 19-25. Your Body mass index is 40.39 kg/m?Marland Kitchen If this is out of the aformentioned range listed, please consider follow up with your Primary Care Provider.  ? ?________________________________________________________ ? ?The [facility] providers would like to encourage you to use [facility] to communicate with providers for non-urgent requests or questions.  Due to long hold times on the telephone, sending your provider a message by [facility] may be a faster and more efficient way to get a response.  Please allow 48 business hours for a response.  Please remember that this is for non-urgent requests.  ?_______________________________________________________ ? ?You have been scheduled for an endoscopy. Please follow written instructions given to you at your visit today. ?If you use inhalers (even only as needed), please bring them with you on the day of your procedure. ? ?Due to recent changes in healthcare laws, you may see the results of your imaging and laboratory studies on MyChart before your provider has had a chance to review them.  We understand that in some cases there may be results that are confusing or concerning to you. Not all laboratory results come back in the same time frame and the provider may be waiting for multiple results in order to interpret others.  Please give [place] 48 hours in order for your provider to thoroughly review all the results before contacting the office for clarification of your results.  ? ?It was a pleasure to see you today! ? ?Thank you for trusting me with your gastrointestinal care!   ?  ?

## 2021-09-03 NOTE — Progress Notes (Signed)
? ? ? ?[facility] Progress Note ? ?Chief Complaint: Abdominal and chest pain ? ?Subjective  ?History: ?Maria Hudson was seen in January office consult for episodic nonexertional upper abdomen/chest pain difficulty characterize, she has complex cardiac disease, orthostatic hypotension, pulmonary hypertension and advanced CKD nearing need for dialysis.  Extensive description of her medical history in that consult note.  Also severe constipation with history and exam elements suggesting pelvic floor dysfunction, probably a component of 1 or more medicines as well. ?______________ ? ?Maria Hudson is glad to report that her constipation is much improved taking an herbal supplement she bought online.  She has not needed the suppositories or MiraLAX. ?However, she is still troubled by the same left lower chest pain that is episodic as before.  When it happens the pain is quite severe and can make her feel nauseated and sweaty until it subsides.  It can happen at rest or after meals.  This is the same pain she was experiencing over months including when she was hospitalized and she ruled out for acute cardiac ischemia during one of the episodes. ?She recalls being put on some antacid medicine but does not feel that it helped.  Denies dysphagia or odynophagia. ? ?ROS: ?Cardiovascular:  no chest pain ?Respiratory: no dyspnea ?Recent right knee pain since she fractured her patella after a fall. ?Generalized fatigue ?Remainder of systems negative except as above ?The patient's Past Medical, Family and Social History were reviewed and are on file in the EMR. ?Past Medical History:  ?Diagnosis Date  ? Acute on chronic diastolic heart failure ([place]) 03/05/2021  ? Anemia   ? Arthritis   ? CAD (coronary artery disease)   ? CHF (congestive heart failure) ([place])   ? CKD (chronic kidney disease) stage 4, GFR 15-29 ml/min (HCC) 03/05/2021  ? Colon polyps   ? CVA (cerebral vascular accident) [facility])   ? Depression   ? Diabetes mellitus without  complication ([place])   ? Hypertension   ? Hypertensive urgency 04/14/2021  ? OSA (obstructive sleep apnea) 05/27/2021  ? Pneumonia   ? Pure hypercholesterolemia 05/27/2021  ? Renal disorder   ? 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 Sleep apnea   ? Stroke [facility])   ? ?From CHF clinic note on 08/19/2021: ?"Assessment/Plan: ?1. Supine hypertension with orthostatic hypotension: This is difficult to manage.  Resting BP remains high today, she denies orthostatic symptoms currently though not on her feet much with patellar fracture.  Workup for secondary hypertension has been negative.  Testing has been negative for amyloidosis (concerned for autonomic neuropathy with orthostatic hypotension).  ?- Continue Coreg 25 mg bid, clonidine at current dose, chlorthalidone, and hydralazine 100 tid.   ?- Stop amlodipine, start nifedipine CR 120 mg daily.  ?- Off spironolactone with AKI.  ?2. Chronic diastolic CHF: Echo in 3/26 with EF 55-60%, normal RV. RHC in 11/22 with optimized filling pressures.  PYP scan, myeloma studies were not suggestive of cardiac amyloidosis. She has struggled with volume overload in setting of CKD stage IV.  She is not volume overloaded on exam today though weight is up (likely caloric intake with minimal activity).  ?- Continue torsemide 20 mg every other day, check BMET today.   ?- With Medicaid, unable to get Cardiomems.  ?3. CKD stage IV: Check BMET today.  ?4. CAD: H/o occluded PDA with collaterals.  No chest pain.  ?- Continue ASA 81 daily.  ?- Continue atorvastatin, check lipids at next appt.  ?5. Obesity: I will refer to pharmacy clinic  to see if she can get semaglutide for weight loss.  ?  ?Followup 3 months with APP.  ?  ?Loralie Champagne ?08/19/2021" ? ? ?Objective: ? ?Med list reviewed ? ?Current Outpatient Medications:  ?  Accu-Chek Softclix Lancets lancets, 3 (three) times daily., Disp: , Rfl:  ?  acetaminophen (TYLENOL) 500 MG tablet, Take 1,000 mg by mouth every 6 (six) hours as needed for moderate  pain or headache., Disp: , Rfl:  ?  allopurinol (ZYLOPRIM) 100 MG tablet, Take 100 mg by mouth daily., Disp: , Rfl:  ?  aspirin EC 81 MG tablet, Take 81 mg by mouth daily. Swallow whole., Disp: , Rfl:  ?  atorvastatin (LIPITOR) 40 MG tablet, Take 1 tablet (40 mg total) by mouth every evening., Disp: 90 tablet, Rfl: 3 ?  buPROPion (WELLBUTRIN) 100 MG tablet, Take 100 mg by mouth 2 (two) times daily., Disp: , Rfl:  ?  carvedilol (COREG) 25 MG tablet, Take 1 tablet (25 mg total) by mouth 2 (two) times daily with a meal., Disp: 60 tablet, Rfl: 0 ?  chlorthalidone (HYGROTON) 25 MG tablet, Take 1 tablet (25 mg total) by mouth daily., Disp: 90 tablet, Rfl: 3 ?  cloNIDine (CATAPRES - DOSED IN MG/24 HR) 0.3 mg/24hr patch, Place 1 patch (0.3 mg total) onto the skin once a week., Disp: 4 patch, Rfl: 12 ?  diclofenac Sodium (VOLTAREN) 1 % GEL, Apply 2 g topically as needed., Disp: , Rfl:  ?  dicyclomine (BENTYL) 10 MG capsule, Take 10 mg by mouth as needed., Disp: , Rfl:  ?  ferrous sulfate 325 (65 FE) MG tablet, Take 325 mg by mouth 2 (two) times daily., Disp: , Rfl:  ?  fluticasone (FLONASE) 50 MCG/ACT nasal spray, Place 1 spray into both nostrils daily as needed for allergies or rhinitis., Disp: , Rfl:  ?  hydrALAZINE (APRESOLINE) 100 MG tablet, Take 100 mg by mouth 3 (three) times daily., Disp: , Rfl:  ?  insulin glargine (LANTUS) 100 unit/mL SOPN, Inject 10 Units into the skin at bedtime., Disp: , Rfl:  ?  isosorbide mononitrate (IMDUR) 60 MG 24 hr tablet, Take 1.5 tablets (90 mg total) by mouth daily., Disp: 135 tablet, Rfl: 3 ?  loratadine (CLARITIN) 10 MG tablet, Take 10 mg by mouth daily., Disp: , Rfl:  ?  Multiple Vitamin (MULTIVITAMIN ADULT) TABS, Take 1 tablet by mouth daily., Disp: , Rfl:  ?  NIFEdipine (ADALAT CC) 60 MG 24 hr tablet, Take 2 tablets (120 mg total) by mouth daily., Disp: 60 tablet, Rfl: 11 ?  nitroGLYCERIN (NITROSTAT) 0.4 MG SL tablet, Place 1 tablet (0.4 mg total) under the tongue every 5 (five)  minutes as needed for chest pain., Disp: 20 tablet, Rfl: 0 ?  NOVOLOG FLEXPEN 100 UNIT/ML FlexPen, Inject 8 Units into the skin 3 (three) times daily with meals., Disp: , Rfl:  ?  Omega-3 Fatty Acids (FISH OIL) 1000 MG CAPS, Take 1,000 mg by mouth daily., Disp: , Rfl:  ?  oxyCODONE (ROXICODONE) 15 MG immediate release tablet, Take 1 tablet by mouth as needed., Disp: , Rfl:  ?  pantoprazole (PROTONIX) 40 MG tablet, Take 40 mg by mouth as needed., Disp: , Rfl:  ?  predniSONE (DELTASONE) 20 MG tablet, Take 40 mg by mouth daily with breakfast. For 3 days then 1 tablet for 4 days, Disp: , Rfl:  ?  pregabalin (LYRICA) 25 MG capsule, Take 25 mg by mouth 2 (two) times daily., Disp: , Rfl:  ?  sertraline (ZOLOFT) 50 MG tablet, Take 50 mg by mouth daily., Disp: , Rfl:  ?  torsemide (DEMADEX) 20 MG tablet, Take 1 tablet (20 mg total) by mouth every other day. Patient takes 1 tablet by mouth every other day as needed for fluid retention., Disp: 15 tablet, Rfl: 3 ?  traZODone (DESYREL) 50 MG tablet, Take 50 mg by mouth at bedtime., Disp: , Rfl:  ? ? ?Vital signs in last 24 hrs: ?Vitals:  ? 09/03/21 0819  ?BP: (!) 158/72  ?Pulse: 65  ? ?Wt Readings from Last 3 Encounters:  ?09/03/21 228 lb (103.4 kg)  ?09/02/21 235 lb (106.6 kg)  ?08/19/21 229 lb 9.6 oz (104.1 kg)  ?  ?Physical Exam ? ?Limited exam, as she is sitting in a chair with her right leg outstretched due to that recent pain and injury.  She cannot get up on the exam table. ?HEENT: sclera anicteric, oral mucosa moist without lesions ?Cardiac: RRR without murmurs, S1S2 heard, no peripheral edema ?Pulm: clear to auscultation bilaterally, normal RR and effort noted ?Abdomen: soft, no tenderness, with active bowel sounds. No guarding or palpable hepatosplenomegaly.  She points to an area of the chest wall just under the left breast as the location of the pain, but says it is never tender when she pushes on it, nor is it on exam today. ?Skin; warm and dry, no jaundice or  rash ? ?Labs: ? ? ?___________________________________________ ?Radiologic studies: ? ?CLINICAL DATA:  Generalized abdominal pain, nausea, constipation. ?  ?EXAM: ?CT ABDOMEN AND PELVIS WITHOUT CONTRAST ?  ?T

## 2021-09-03 NOTE — Discharge Instructions (Addendum)
If not allergic, you may use over the counter ibuprofen or acetaminophen as needed. ? ?Your blood pressure was noted to be elevated during your visit today. If you are currently taking medication for high blood pressure, please ensure you are taking this as directed. If you do not have a history of high blood pressure and your blood pressure remains persistently elevated, you may need to begin taking a medication at some point. You may return here within the next few days to recheck if unable to see your primary care provider or if you do not have a one. ? ?BP (!) 196/75 (BP Location: Right Arm)   Pulse 70   Temp 98 ?F (36.7 ?C) (Oral)   Resp 20   SpO2 91%  ? ?BP Readings from Last 3 Encounters:  ?09/03/21 (!) 196/75  ?09/03/21 (!) 158/72  ?08/19/21 (!) 144/80  ? ? ?

## 2021-09-08 ENCOUNTER — Other Ambulatory Visit: Payer: Self-pay

## 2021-09-08 ENCOUNTER — Ambulatory Visit (HOSPITAL_COMMUNITY): Payer: Medicaid Other | Admitting: Certified Registered Nurse Anesthetist

## 2021-09-08 ENCOUNTER — Encounter (HOSPITAL_COMMUNITY): Payer: Self-pay | Admitting: Gastroenterology

## 2021-09-08 ENCOUNTER — Encounter (HOSPITAL_COMMUNITY)
Admission: RE | Disposition: A | Discharge status: Home / Self Care | Payer: Self-pay | Source: Home / Self Care | Attending: Gastroenterology

## 2021-09-08 ENCOUNTER — Ambulatory Visit (HOSPITAL_BASED_OUTPATIENT_CLINIC_OR_DEPARTMENT_OTHER): Payer: Medicaid Other | Admitting: Certified Registered Nurse Anesthetist

## 2021-09-08 ENCOUNTER — Ambulatory Visit (HOSPITAL_COMMUNITY)
Admission: RE | Admit: 2021-09-08 | Discharge: 2021-09-08 | Disposition: A | Discharge status: Home / Self Care | Payer: Medicaid Other | Attending: Gastroenterology | Admitting: Gastroenterology

## 2021-09-08 DIAGNOSIS — N184 Chronic kidney disease, stage 4 (severe): Secondary | ICD-10-CM

## 2021-09-08 DIAGNOSIS — Z6841 Body Mass Index (BMI) 40.0 and over, adult: Secondary | ICD-10-CM | POA: Insufficient documentation

## 2021-09-08 DIAGNOSIS — K219 Gastro-esophageal reflux disease without esophagitis: Secondary | ICD-10-CM | POA: Diagnosis not present

## 2021-09-08 DIAGNOSIS — K3189 Other diseases of stomach and duodenum: Secondary | ICD-10-CM | POA: Insufficient documentation

## 2021-09-08 DIAGNOSIS — R1013 Epigastric pain: Secondary | ICD-10-CM

## 2021-09-08 DIAGNOSIS — Z8673 Personal history of transient ischemic attack (TIA), and cerebral infarction without residual deficits: Secondary | ICD-10-CM | POA: Diagnosis not present

## 2021-09-08 DIAGNOSIS — E1122 Type 2 diabetes mellitus with diabetic chronic kidney disease: Secondary | ICD-10-CM | POA: Diagnosis not present

## 2021-09-08 DIAGNOSIS — I5032 Chronic diastolic (congestive) heart failure: Secondary | ICD-10-CM

## 2021-09-08 DIAGNOSIS — I2510 Atherosclerotic heart disease of native coronary artery without angina pectoris: Secondary | ICD-10-CM

## 2021-09-08 DIAGNOSIS — R079 Chest pain, unspecified: Secondary | ICD-10-CM | POA: Diagnosis not present

## 2021-09-08 DIAGNOSIS — G4730 Sleep apnea, unspecified: Secondary | ICD-10-CM | POA: Diagnosis not present

## 2021-09-08 DIAGNOSIS — K8020 Calculus of gallbladder without cholecystitis without obstruction: Secondary | ICD-10-CM | POA: Insufficient documentation

## 2021-09-08 DIAGNOSIS — I130 Hypertensive heart and chronic kidney disease with heart failure and stage 1 through stage 4 chronic kidney disease, or unspecified chronic kidney disease: Secondary | ICD-10-CM | POA: Insufficient documentation

## 2021-09-08 DIAGNOSIS — R0789 Other chest pain: Secondary | ICD-10-CM

## 2021-09-08 DIAGNOSIS — K2970 Gastritis, unspecified, without bleeding: Secondary | ICD-10-CM | POA: Diagnosis not present

## 2021-09-08 DIAGNOSIS — I110 Hypertensive heart disease with heart failure: Secondary | ICD-10-CM | POA: Diagnosis not present

## 2021-09-08 DIAGNOSIS — F32A Depression, unspecified: Secondary | ICD-10-CM | POA: Insufficient documentation

## 2021-09-08 DIAGNOSIS — I509 Heart failure, unspecified: Secondary | ICD-10-CM | POA: Diagnosis not present

## 2021-09-08 HISTORY — PX: ESOPHAGOGASTRODUODENOSCOPY (EGD) WITH PROPOFOL: SHX5813

## 2021-09-08 HISTORY — PX: BIOPSY: SHX5522

## 2021-09-08 LAB — GLUCOSE, CAPILLARY
Glucose-Capillary: 100 mg/dL — ABNORMAL HIGH (ref 70–99)
Glucose-Capillary: 71 mg/dL (ref 70–99)
Glucose-Capillary: 105 mg/dL — ABNORMAL HIGH (ref 70–99)

## 2021-09-08 SURGERY — ESOPHAGOGASTRODUODENOSCOPY (EGD) WITH PROPOFOL
Anesthesia: Monitor Anesthesia Care

## 2021-09-08 MED ORDER — DEXTROSE 50 % IV SOLN
25.0000 mL | Freq: Once | INTRAVENOUS | Status: AC
  Administered 2021-09-08: 25 mL via INTRAVENOUS

## 2021-09-08 MED ORDER — SODIUM CHLORIDE 0.9 % IV SOLN: INTRAVENOUS | Status: DC

## 2021-09-08 MED ORDER — LACTATED RINGERS IV SOLN: INTRAVENOUS | Status: DC | PRN

## 2021-09-08 MED ORDER — DEXTROSE 50 % IV SOLN
INTRAVENOUS | Status: AC
  Filled 2021-09-08: qty 50

## 2021-09-08 MED ORDER — PROPOFOL 500 MG/50ML IV EMUL
INTRAVENOUS | Status: DC | PRN
  Administered 2021-09-08: 150 ug/kg/min via INTRAVENOUS

## 2021-09-08 SURGICAL SUPPLY — 15 items

## 2021-09-08 NOTE — Anesthesia Postprocedure Evaluation (Signed)
Anesthesia Post Note ? ?Patient: Sheresa Cullop ? ?Procedure(s) Performed: ESOPHAGOGASTRODUODENOSCOPY (EGD) WITH PROPOFOL ?BIOPSY ? ?  ? ?Patient location during evaluation: Endoscopy ?Anesthesia Type: MAC ?Level of consciousness: awake and alert, oriented and patient cooperative ?Pain management: pain level controlled ?Vital Signs Assessment: post-procedure vital signs reviewed and stable ?Respiratory status: spontaneous breathing, nonlabored ventilation and respiratory function stable ?Cardiovascular status: stable and blood pressure returned to baseline ?Postop Assessment: no apparent nausea or vomiting and able to ambulate ?Anesthetic complications: no ? ? ?No notable events documented. ? ?Last Vitals:  ?Vitals:  ? 09/08/21 0931 09/08/21 0942  ?BP: 132/73 (!) 144/71  ?Pulse: (!) 58 (!) 58  ?Resp: (!) 21 15  ?Temp:    ?SpO2: 96% 93%  ?  ?Last Pain:  ?Vitals:  ? 09/08/21 0942  ?TempSrc:   ?PainSc: 0-No pain  ? ? ?  ?  ?  ?  ?  ?  ? ?Skarlette Lattner,E. Dawnna Gritz ? ? ? ? ?

## 2021-09-08 NOTE — Transfer of Care (Signed)
Immediate Anesthesia Transfer of Care Note ? ?Patient: Maria Hudson ? ?Procedure(s) Performed: Procedure(s) with comments: ?ESOPHAGOGASTRODUODENOSCOPY (EGD) WITH PROPOFOL (N/A) - chest pain, epigastric pain ?BIOPSY ? ?Patient Location: PACU and Endoscopy Unit ? ?Anesthesia Type:MAC ? ?Level of Consciousness: awake, alert  and oriented ? ?Airway & Oxygen Therapy: Patient Spontanous Breathing and Patient connected to nasal cannula oxygen ? ?Post-op Assessment: Report given to RN and Post -op Vital signs reviewed and stable ? ?Post vital signs: Reviewed and stable ? ?Last Vitals:  ?Vitals:  ? 09/08/21 0720  ?BP: (!) 158/61  ?Pulse: 62  ?Resp: 16  ?Temp: 36.6 ?C  ?SpO2: 98%  ? ? ?Complications: No apparent anesthesia complications ? ?

## 2021-09-08 NOTE — Discharge Instructions (Signed)
YOU HAD AN ENDOSCOPIC PROCEDURE TODAY: Refer to the procedure report and other information in the discharge instructions given to you for any specific questions about what was found during the examination. If this information does not answer your questions, please call [place] office at [phone] to clarify.  ° °YOU SHOULD EXPECT: Some feelings of bloating in the abdomen. Passage of more gas than usual. Walking can help get rid of the air that was put into your GI tract during the procedure and reduce the bloating. If you had a lower endoscopy (such as a colonoscopy or flexible sigmoidoscopy) you may notice spotting of blood in your stool or on the toilet paper. Some abdominal soreness may be present for a day or two, also. ° °DIET: Your first meal following the procedure should be a light meal and then it is ok to progress to your normal diet. A half-sandwich or bowl of soup is an example of a good first meal. Heavy or fried foods are harder to digest and may make you feel nauseous or bloated. Drink plenty of fluids but you should avoid alcoholic beverages for 24 hours. If you had a esophageal dilation, please see attached instructions for diet.   ° °ACTIVITY: Your care partner should take you home directly after the procedure. You should plan to take it easy, moving slowly for the rest of the day. You can resume normal activity the day after the procedure however YOU SHOULD NOT DRIVE, use power tools, machinery or perform tasks that involve climbing or major physical exertion for 24 hours (because of the sedation medicines used during the test).  ° °SYMPTOMS TO REPORT IMMEDIATELY: °A gastroenterologist can be reached at any hour. Please call [phone]  for any of the following symptoms:  °Following lower endoscopy (colonoscopy, flexible sigmoidoscopy) °Excessive amounts of blood in the stool  °Significant tenderness, worsening of abdominal pains  °Swelling of the abdomen that is new, acute  °Fever of 100° or  higher  °Following upper endoscopy (EGD, EUS, ERCP, esophageal dilation) °Vomiting of blood or coffee ground material  °New, significant abdominal pain  °New, significant chest pain or pain under the shoulder blades  °Painful or persistently difficult swallowing  °New shortness of breath  °Black, tarry-looking or red, bloody stools ° °FOLLOW UP:  °If any biopsies were taken you will be contacted by phone or by letter within the next 1-3 weeks. Call [phone]  if you have not heard about the biopsies in 3 weeks.  °Please also call with any specific questions about appointments or follow up tests. ° °

## 2021-09-08 NOTE — H&P (Signed)
History and Physical: ? This patient presents for endoscopic testing for: ?Epigastric pain ? ?From my comprehensive outpatient office note on 09/03/2021: ?"Maria Hudson was seen in January office consult for episodic nonexertional upper abdomen/chest pain difficulty characterize, she has complex cardiac disease, orthostatic hypotension, pulmonary hypertension and advanced CKD nearing need for dialysis.  Extensive description of her medical history in that consult note.  Also severe constipation with history and exam elements suggesting pelvic floor dysfunction, probably a component of 1 or more medicines as well. ?______________ ?  ?Maria Hudson is glad to report that her constipation is much improved taking an herbal supplement she bought online.  She has not needed the suppositories or MiraLAX. ?However, she is still troubled by the same left lower chest pain that is episodic as before.  When it happens the pain is quite severe and can make her feel nauseated and sweaty until it subsides.  It can happen at rest or after meals.  This is the same pain she was experiencing over months including when she was hospitalized and she ruled out for acute cardiac ischemia during one of the episodes. ?She recalls being put on some antacid medicine but does not feel that it helped.  Denies dysphagia or odynophagia" ? ?No clinical change since that visit. ? ? ?ROS: ?Patient denies chest pain or shortness of breath ? ? ?Past Medical History: ?Past Medical History:  ?Diagnosis Date  ? Acute on chronic diastolic heart failure ([place]) 03/05/2021  ? Anemia   ? Arthritis   ? CAD (coronary artery disease)   ? CHF (congestive heart failure) ([place])   ? CKD (chronic kidney disease) stage 4, GFR 15-29 ml/min (HCC) 03/05/2021  ? Colon polyps   ? CVA (cere[facility] vascular accident) [facility])   ? Depression   ? Diabetes mellitus without complication ([place])   ? Hypertension   ? Hypertensive urgency 04/14/2021  ? OSA (obstructive sleep apnea) 05/27/2021  ?  Pneumonia   ? Pure hypercholesterolemia 05/27/2021  ? Renal disorder   ? 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 Sleep apnea   ? Stroke [facility])   ? ? ? ?Past Surgical History: ?Past Surgical History:  ?Procedure Laterality Date  ? ABDOMINAL HYSTERECTOMY    ? CESAREAN SECTION    ? INNER EAR SURGERY Bilateral   ? RIGHT HEART CATH N/A 05/14/2021  ? Procedure: RIGHT HEART CATH;  Surgeon: Larey Dresser, MD;  Location: [place] [facility];  Service: Cardiovascular;  Laterality: N/A;  ? TONSILLECTOMY    ? age 18  ? ? ?Allergies: ?Allergies  ?Allergen Reactions  ? Hydrocodone Itching  ?  Patient able to tolerate when taken with Benadryl.  ? Oxycodone-Acetaminophen Itching  ?  Patient able to tolerate when taken with Benadryl.  ? ? ?Outpatient Meds: ?Current Facility-Administered Medications  ?Medication Dose Route Frequency Provider Last Rate Last Admin  ? 0.9 %  sodium chloride infusion   Intravenous Continuous Danis, Kirke Corin, MD      ? ? ? ? ?___________________________________________________________________ ?Objective  ? ?Exam: ? ?BP (!) 158/61   Pulse 62   Temp 97.8 ?F (36.6 ?C) (Oral)   Resp 16   Ht 5\' 3"  (1.6 m)   Wt 103.4 kg   SpO2 98%   BMI 40.39 kg/m?  ? ?CV: RRR without murmur, S1/S2 ?Resp: clear to auscultation bilaterally, normal RR and effort noted ?GI: soft, no tenderness, with active bowel sounds. ? ? ?Assessment: ?Chronic epigastric pain ? ? ?Plan: ?EGD ? The benefits and risks of  the planned procedure were described in detail with the patient or (when appropriate) their health care proxy.  Risks were outlined as including, but not limited to, bleeding, infection, perforation, adverse medication reaction leading to cardiac or pulmonary decompensation, pancreatitis (if ERCP).  The limitation of incomplete mucosal visualization was also discussed.  No guarantees or warranties were given. ? ? ? ?The patient is appropriate for an endoscopic procedure in the ambulatory setting. ? ? - Wilfrid Lund,  MD ? ? ? ? ? ?

## 2021-09-08 NOTE — Interval H&P Note (Signed)
History and Physical Interval Note: ? ?09/08/2021 ?9:01 AM ? ?Maria Hudson  has presented today for surgery, with the diagnosis of epigastric pain, chest pain.  The various methods of treatment have been discussed with the patient and family. After consideration of risks, benefits and other options for treatment, the patient has consented to  Procedure(s) with comments: ?ESOPHAGOGASTRODUODENOSCOPY (EGD) WITH PROPOFOL (N/A) - chest pain, epigastric pain as a surgical intervention.  The patient's history has been reviewed, patient examined, no change in status, stable for surgery.  I have reviewed the patient's chart and labs.  Questions were answered to the patient's satisfaction.   ? ? ?[name] ? ? ?

## 2021-09-08 NOTE — Op Note (Signed)
[facility] ?Patient Name: Maria Hudson ?Procedure Date: 09/08/2021 ?MRN: [id number] ?Attending MD: Estill Cotta. Loletha Carrow , MD ?Date of Birth: February 12, 1961 ?CSN: [id number] ?Age: 61 ?Admit Type: Outpatient ?Procedure:                Upper GI endoscopy ?Indications:              Epigastric abdominal pain, Unexplained chest pain ?                          see recent [facility] clinic notes for details ?Providers:                Estill Cotta. Loletha Carrow, MD, Dulcy Fanny, Charlean Merl  ?                          Purcell Nails, Technician ?Referring MD:              ?Medicines:                Monitored Anesthesia Care ?Complications:            No immediate complications. ?Estimated Blood Loss:     Estimated blood loss was minimal. ?Procedure:                Pre-Anesthesia Assessment: ?                          - Prior to the procedure, a History and Physical  ?                          was performed, and patient medications and  ?                          allergies were reviewed. The patient's tolerance of  ?                          previous anesthesia was also reviewed. The risks  ?                          and benefits of the procedure and the sedation  ?                          options and risks were discussed with the patient.  ?                          All questions were answered, and informed consent  ?                          was obtained. Prior Anticoagulants: The patient has  ?                          taken no previous anticoagulant or antiplatelet  ?                          agents. ASA Grade Assessment: III - A patient with  ?  severe systemic disease. After reviewing the risks  ?                          and benefits, the patient was deemed in  ?                          satisfactory condition to undergo the procedure. ?                          After obtaining informed consent, the endoscope was  ?                          passed under direct vision. Throughout the  ?                           procedure, the patient's blood pressure, pulse, and  ?                          oxygen saturations were monitored continuously. The  ?                          [id number]-[id number] ([id number]) Olympus endoscope was introduced  ?                          through the mouth, and advanced to the second part  ?                          of duodenum. The upper GI endoscopy was  ?                          accomplished without difficulty. The patient  ?                          tolerated the procedure well. ?Scope In: ?Scope Out: ?Findings: ?     The esophagus was normal. ?     Patchy mild inflammation characterized by congestion (edema), erythema,  ?     granularity and linear erosions was found on the posterior wall of the  ?     gastric body. Several biopsies were obtained on the posterior wall of  ?     the gastric body, on the greater curvature of the gastric antrum and on  ?     the lesser curvature of the gastric antrum with cold forceps for  ?     histology. ?     The exam of the stomach was otherwise normal. ?     Diffuse mucosal changes characterized by melanosis were found in the  ?     entire duodenum. ?     The exam of the duodenum was otherwise normal. ?Impression:               - Normal esophagus. ?                          - Gastritis. ?                          -  Mucosal changes in the duodenum. ?                          - Several biopsies were obtained on the posterior  ?                          wall of the gastric body, on the greater curvature  ?                          of the gastric antrum and on the lesser curvature  ?                          of the gastric antrum. ?                          Patient's episodic pain in left -sided under  ?                          breast, non exertional and not clearly meal  ?                          -related. Small gallstones seen on recent CTAP, not  ?                          clear if related to pain. ?Moderate Sedation: ?     MAC sedation used ?Recommendation:           -  Patient has a contact number available for  ?                          emergencies. The signs and symptoms of potential  ?                          delayed complications were discussed with the  ?                          patient. Return to normal activities tomorrow.  ?                          Written discharge instructions were provided to the  ?                          patient. ?                          - Resume previous diet. ?                          - Continue present medications. ?                          - Await pathology results. ?Procedure Code(s):        --- Professional --- ?                          [phone], Esophagogastroduodenoscopy, flexible,  ?  transoral; with biopsy, single or multiple ?Diagnosis Code(s):        --- Professional --- ?                          K29.70, Gastritis, unspecified, without bleeding ?                          K31.89, Other diseases of stomach and duodenum ?                          R10.13, Epigastric pain ?                          R07.9, Chest pain, unspecified ?CPT copyright 2019 American Medical Association. All rights reserved. ?The codes documented in this report are preliminary and upon coder review may  ?be revised to meet current compliance requirements. ?Afomia Blackley L. Loletha Carrow, MD ?09/08/2021 9:37:04 AM ?This report has been signed electronically. ?Number of Addenda: 0 ?

## 2021-09-08 NOTE — Anesthesia Preprocedure Evaluation (Addendum)
Anesthesia Evaluation  ?Patient identified by MRN, date of birth, ID band ?Patient awake ? ? ? ?Reviewed: ?Allergy & Precautions, NPO status , Patient's Chart, lab work & pertinent test results ? ?History of Anesthesia Complications ?Negative for: history of anesthetic complications ? ?Airway ?Mallampati: II ? ?TM Distance: >3 FB ?Neck ROM: Full ? ? ? Dental ? ?(+) Dental Advisory Given ?  ?Pulmonary ?sleep apnea (does not use CPAP yet) ,  ?  ?breath sounds clear to auscultation ? ? ? ? ? ? Cardiovascular ?hypertension, Pt. on medications and Pt. on home beta blockers ?(-) angina+ CAD and +CHF  ? ?Rhythm:Regular Rate:Normal ? ?04/2021 ECHO: EF 55- 60%. The LV has normal function, no regional wall motion abnormalities. There is mod LVH,m no significant valvular abnormalities ? ?'14 LHC: 20% LAD, 50 to 60% left circumflex, and 100% RPDA stenosis with left-to-right collaterals ?  ?Neuro/Psych ?Depression CVA   ? GI/Hepatic ?Neg liver ROS, GERD  Medicated and Controlled,  ?Endo/Other  ?diabetes (glu 100), Insulin DependentMorbid obesity ? Renal/GU ?Renal InsufficiencyRenal disease  ? ?  ?Musculoskeletal ? ? Abdominal ?(+) + obese,   ?Peds ? Hematology ?  ?Anesthesia Other Findings ? ? Reproductive/Obstetrics ? ?  ? ? ? ? ? ? ? ? ? ? ? ? ? ?  ?  ? ? ? ? ? ? ? ?Anesthesia Physical ?Anesthesia Plan ? ?ASA: 4 ? ?Anesthesia Plan: MAC  ? ?Post-op Pain Management: Minimal or no pain anticipated  ? ?Induction:  ? ?PONV Risk Score and Plan: 2 and Ondansetron and Treatment may vary due to age or medical condition ? ?Airway Management Planned: Natural Airway and Nasal Cannula ? ?Additional Equipment: None ? ?Intra-op Plan:  ? ?Post-operative Plan:  ? ?Informed Consent: I have reviewed the patients History and Physical, chart, labs and discussed the procedure including the risks, benefits and alternatives for the proposed anesthesia with the patient or authorized representative who has indicated  his/her understanding and acceptance.  ? ? ? ?Dental advisory given ? ?Plan Discussed with: CRNA and Surgeon ? ?Anesthesia Plan Comments:   ? ? ? ? ? ?Anesthesia Quick Evaluation ? ?

## 2021-09-09 LAB — SURGICAL PATHOLOGY

## 2021-09-10 ENCOUNTER — Encounter (HOSPITAL_BASED_OUTPATIENT_CLINIC_OR_DEPARTMENT_OTHER): Payer: Self-pay | Admitting: Cardiovascular Disease

## 2021-09-10 NOTE — Procedures (Signed)
? ? ? ? ?Patient Name: Maria Hudson, Maria Hudson ?Study Date: 09/02/2021 ?Gender: Female ?D.O.B: Dec 12, 1960 ?Age (years): 19 ?Referring Provider: Skeet Latch ?Height (inches): 64 ?Interpreting Physician: Shelva Majestic MD, ABSM ?Weight (lbs): 235 ?RPSGT: Laren Everts ?BMI: 41 ?MRN: [id number] ?Neck Size: 14.25 ? ?CLINICAL INFORMATION ?The patient is referred for a CPAP titration to treat sleep apnea. ? ?Date of NPSG: 06/01/2021: AHI 17.3/h; RDI 19.8/h; REM AHI 57.0/h; supine AHI 40/h; O2 nadir 77%. ? ?SLEEP STUDY TECHNIQUE ?As per the AASM Manual for the Scoring of Sleep and Associated Events v2.3 (April 2016) with a hypopnea requiring 4% desaturations. ? ?The channels recorded and monitored were frontal, central and occipital EEG, electrooculogram (EOG), submentalis EMG (chin), nasal and oral airflow, thoracic and abdominal wall motion, anterior tibialis EMG, snore microphone, electrocardiogram, and pulse oximetry. Continuous positive airway pressure (CPAP) was initiated at the beginning of the study and titrated to treat sleep-disordered breathing. ? ?MEDICATIONS ?acetaminophen (TYLENOL) 500 MG tablet ?allopurinol (ZYLOPRIM) 100 MG tablet ?aspirin EC 81 MG tablet ?atorvastatin (LIPITOR) 40 MG tablet ?buPROPion (WELLBUTRIN) 100 MG tablet ?carvedilol (COREG) 25 MG tablet ?chlorthalidone (HYGROTON) 25 MG tablet ?cloNIDine (CATAPRES - DOSED IN MG/24 HR) 0.3 mg/24hr patch ?diclofenac Sodium (VOLTAREN) 1 % GEL ?doxycycline (VIBRAMYCIN) 100 MG capsule ?ferrous sulfate 325 (65 FE) MG tablet ?fluticasone (FLONASE) 50 MCG/ACT nasal spray ?hydrALAZINE (APRESOLINE) 100 MG tablet ?insulin glargine (LANTUS) 100 unit/mL SOPN ?isosorbide mononitrate (IMDUR) 60 MG 24 hr tablet ?loratadine (CLARITIN) 10 MG tablet ?Multiple Vitamin (MULTIVITAMIN ADULT) TABS ?NIFEdipine (ADALAT CC) 60 MG 24 hr tablet ?nitroGLYCERIN (NITROSTAT) 0.4 MG SL tablet ?NOVOLOG FLEXPEN 100 UNIT/ML FlexPen ?Omega-3 Fatty Acids (FISH OIL) 1000 MG  CAPS ?oxyCODONE-acetaminophen (PERCOCET/ROXICET) 5-325 MG tablet ?pantoprazole (PROTONIX) 40 MG tablet ?pregabalin (LYRICA) 25 MG capsule ?sertraline (ZOLOFT) 50 MG tablet ?torsemide (DEMADEX) 20 MG tablet ?traZODone (DESYREL) 50 MG tablet ?Medications self-administered by patient taken the night of the study : TRAZODONE, LIPITOR, Clonidine patch, HYDRALAZINE, OXYCODONE HCL ? ?TECHNICIAN COMMENTS ?Comments added by technician: None ?Comments added by scorer: N/A ? ?RESPIRATORY PARAMETERS ?Optimal PAP Pressure (cm): 11 AHI at Optimal Pressure (/hr): 1.2 ?Overall Minimal O2 (%): 75.0 Supine % at Optimal Pressure (%): 0 ?Minimal O2 at Optimal Pressure (%): 88.0  ? ?SLEEP ARCHITECTURE ?The study was initiated at 10:31:22 PM and ended at 5:21:07 AM. ? ?Sleep onset time was 76.0 minutes and the sleep efficiency was 77.2%%. The total sleep time was 316.3 minutes. ? ?The patient spent 5.7%% of the night in stage N1 sleep, 68.1%% in stage N2 sleep, 0.0%% in stage N3 and 26.2% in REM.Stage REM latency was 65.0 minutes ? ?Wake after sleep onset was 17.5. Alpha intrusion was absent. Supine sleep was 49.00%. ? ?CARDIAC DATA ?The 2 lead EKG demonstrated sinus rhythm. The mean heart rate was 57.2 beats per minute. Other EKG findings include: PVCs. ? ?LEG MOVEMENT DATA ?The total Periodic Limb Movements of Sleep (PLMS) were 0. The PLMS index was 0.0. A PLMS index of <15 is considered normal in adults. ? ?IMPRESSIONS ?- CPAP was initiated at 5 cm and was titrated to optimal PAP pressure st 11 cm of water (AHI 1.2/h; RDI 1.2/h; O2 nadir 88%). ?- Central sleep apnea was not noted during this titration (CAI 0.2/h). ?- Severe oxygen desaturations to a nadir of 75% at 6 cm of water. ?- No snoring was audible during this study. ?- 2-lead EKG demonstrated: isolated PVCs ?- Clinically significant periodic limb movements were not noted during this study. Arousals associated with PLMs were rare. ? ?  DIAGNOSIS ?- Obstructive Sleep Apnea  (G47.33) ? ?RECOMMENDATIONS ?- Recommend an initial trial of CPAP Auto therapy with EPR of 3 at 11 - 16 cm H2O with heated humidification. A Large size [employer] Full Face Evora Full mask was used for the titration. ?- Effort should be made to optimize nasal and orophayngeal patency. ?- Avoid alcohol, sedatives and other CNS depressants that may worsen sleep apnea and disrupt normal sleep architecture. ?- Sleep hygiene should be reviewed to assess factors that may improve sleep quality. ?- Weight management (BMI 41) and regular exercise should be initiated or continued. ?- Recommend a download and sleep clinic evaluation after 4 weeks of therapy. ? ? ?[Electronically signed] 09/10/2021 09:36 AM ? ?Shelva Majestic MD, [name], ABSM ?[place], [place] Board of Sleep Medicine ? ? ?NPI: [id number] ? ?[place] ?PH: [phone]   FX: [phone] ?ACCREDITED BY THE AMERICAN ACADEMY OF SLEEP MEDICINE ? ?

## 2021-09-12 ENCOUNTER — Ambulatory Visit (INDEPENDENT_AMBULATORY_CARE_PROVIDER_SITE_OTHER): Payer: Medicaid Other | Admitting: Pharmacist

## 2021-09-12 ENCOUNTER — Encounter: Payer: Self-pay | Admitting: Pharmacist

## 2021-09-12 VITALS — BP 107/58 | HR 58 | Wt 228.0 lb

## 2021-09-12 DIAGNOSIS — I10 Essential (primary) hypertension: Secondary | ICD-10-CM | POA: Diagnosis not present

## 2021-09-12 NOTE — Progress Notes (Signed)
Patient ID: Maria Hudson                 DOB: [birth year]                      MRN: [id number] ? ? ? ? ?HPI: ?Maria Hudson is a 61 y.o. female referred by Dr. Oval Linsey to HTN clinic. PMH is significant for CAD, CVA, CHF, HTN, T2DM, and CKD. Patient has a history of resistant hypertension which remains uncontrolled despite mulitple medications. ? ?Patient presents today slow on feet. Was sleeping in waiting room.  In exam room patient reports she woke up at 2pm and realized she had not taken her clonidine patch off so she took it off and then took a clonidine tablet and went back to sleep.  Woke up this mornign and took her AM medications including another clonidine tablet. She realized what she did after she took it and was feeling hypotensive.  Blood pressure in room 107/58 which is much lower than typical. ? ?Ate collared greens and ham this morning because she knew it had sodium to try to increase BP. Had to have daughter drive her to appointment today.  Reports BP at home has been reading very elevated. ? ?Current HTN meds:  ?Carvedilol 25mg  twice a day ?Clonidine patch 0.3mg  ?Chlorthalidone 25mg  daily ?Nifedipine 120mg  daily ?Hydralazine 100mg  TID ?Torsemide 20mg  daily ?Isosorbide 90mg  daily ? ?BP goal: <130/80 ? ?Wt Readings from Last 3 Encounters:  ?09/08/21 228 lb (103.4 kg)  ?09/03/21 228 lb (103.4 kg)  ?09/02/21 235 lb (106.6 kg)  ? ?BP Readings from Last 3 Encounters:  ?09/08/21 (!) 144/71  ?09/03/21 (!) 196/75  ?09/03/21 (!) 158/72  ? ?Pulse Readings from Last 3 Encounters:  ?09/08/21 (!) 58  ?09/03/21 70  ?09/03/21 65  ? ? ?Renal function: ?CrCl cannot be calculated (Patient's most recent lab result is older than the maximum 21 days allowed.). ? ?Past Medical History:  ?Diagnosis Date  ? Acute on chronic diastolic heart failure ([place]) 03/05/2021  ? Anemia   ? Arthritis   ? CAD (coronary artery disease)   ? CHF (congestive heart failure) ([place])   ? CKD (chronic kidney disease) stage 4, GFR 15-29 ml/min  (HCC) 03/05/2021  ? Colon polyps   ? [facility]A ([facility] vascular [facility]) [facility])   ? Depression   ? Diabetes mellitus without complication ([place])   ? Hypertension   ? Hypertensive urgency 04/14/2021  ? OSA (obstructive sleep apnea) 05/27/2021  ? Pneumonia   ? Pure hypercholesterolemia 05/27/2021  ? Renal disorder   ? 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 Sleep apnea   ? Stroke [facility])   ? ? ?Current Outpatient Medications on File Prior to Visit  ?Medication Sig Dispense Refill  ? Accu-Chek Softclix Lancets lancets 3 (three) times daily.    ? acetaminophen (TYLENOL) 500 MG tablet Take 1,000 mg by mouth every 6 (six) hours as needed for moderate pain or headache.    ? allopurinol (ZYLOPRIM) 100 MG tablet Take 100 mg by mouth in the morning.    ? aspirin EC 81 MG tablet Take 81 mg by mouth in the morning. Swallow whole.    ? atorvastatin (LIPITOR) 40 MG tablet Take 1 tablet (40 mg total) by mouth every evening. 90 tablet 3  ? buPROPion (WELLBUTRIN) 100 MG tablet Take 100 mg by mouth 2 (two) times daily.    ? carvedilol (COREG) 25 MG tablet Take 1 tablet (25 mg total) by mouth 2 (two) times  daily with a meal. 60 tablet 0  ? chlorthalidone (HYGROTON) 25 MG tablet Take 1 tablet (25 mg total) by mouth daily. 90 tablet 3  ? cloNIDine (CATAPRES - DOSED IN MG/24 HR) 0.3 mg/24hr patch Place 1 patch (0.3 mg total) onto the skin once a week. (Patient taking differently: Place 0.3 mg onto the skin every Thursday.) 4 patch 12  ? diclofenac Sodium (VOLTAREN) 1 % GEL Apply 2 g topically 4 (four) times daily as needed (chest pain.).    ? doxycycline (VIBRAMYCIN) 100 MG capsule Take 1 capsule (100 mg total) by mouth 2 (two) times daily. 14 capsule 0  ? ferrous sulfate 325 (65 FE) MG tablet Take 325 mg by mouth 2 (two) times daily.    ? fluticasone (FLONASE) 50 MCG/ACT nasal spray Place 1 spray into both nostrils daily as needed for allergies or rhinitis.    ? hydrALAZINE (APRESOLINE) 100 MG tablet Take 100 mg by mouth 3 (three) times daily.     ? insulin glargine (LANTUS) 100 unit/mL SOPN Inject 10 Units into the skin at bedtime.    ? isosorbide mononitrate (IMDUR) 60 MG 24 hr tablet Take 1.5 tablets (90 mg total) by mouth daily. (Patient taking differently: Take 90 mg by mouth every evening.) 135 tablet 3  ? loratadine (CLARITIN) 10 MG tablet Take 10 mg by mouth in the morning.    ? Multiple Vitamin (MULTIVITAMIN ADULT) TABS Take 1 tablet by mouth in the morning.    ? NIFEdipine (ADALAT CC) 60 MG 24 hr tablet Take 2 tablets (120 mg total) by mouth daily. 60 tablet 11  ? nitroGLYCERIN (NITROSTAT) 0.4 MG SL tablet Place 1 tablet (0.4 mg total) under the tongue every 5 (five) minutes as needed for chest pain. 20 tablet 0  ? NOVOLOG FLEXPEN 100 UNIT/ML FlexPen Inject 8 Units into the skin 3 (three) times daily with meals.    ? Omega-3 Fatty Acids (FISH OIL) 1000 MG CAPS Take 1,000 mg by mouth in the morning.    ? oxyCODONE-acetaminophen (PERCOCET/ROXICET) 5-325 MG tablet Take 1 tablet by mouth 3 (three) times daily as needed for severe pain.    ? pantoprazole (PROTONIX) 40 MG tablet Take 40 mg by mouth daily as needed (upset stomach.).    ? pregabalin (LYRICA) 25 MG capsule Take 25 mg by mouth 2 (two) times daily.    ? sertraline (ZOLOFT) 50 MG tablet Take 50 mg by mouth in the morning.    ? torsemide (DEMADEX) 20 MG tablet Take 1 tablet (20 mg total) by mouth every other day. Patient takes 1 tablet by mouth every other day as needed for fluid retention. 15 tablet 3  ? traZODone (DESYREL) 50 MG tablet Take 50 mg by mouth at bedtime.    ? ?No current facility-administered medications on file prior to visit.  ? ? ?Allergies  ?Allergen Reactions  ? Hydrocodone Itching  ?  Patient able to tolerate when taken with Benadryl.  ? Oxycodone-Acetaminophen Itching  ?  Patient able to tolerate when taken with Benadryl.  ? ? ? ?Assessment/Plan: ? ?1. Hypertension -  Patient BP in room today 107/58 which is much lower than patient's usual readings, likely due to taking 2  clonidine tablets as well as wearing a clonidine patch until 2AM last night.  Patient is symptomatic and tired. Difficult to assess patient's hypertensive regimen at this time.  Will continue current regimen and recheck in 2-3 weeks in HTN clinic. Patient's daughter is driving her back home. ? ?  Continue: ?Carvedilol 25mg  twice a day ?Clonidine patch 0.3mg  ?Chlorthalidone 25mg  daily ?Nifedipine 120mg  daily ?Hydralazine 100mg  TID ?Torsemide 20mg  daily ?Isosorbide 90mg  daily ? ?Karren Cobble, PharmD, BCACP, [place], CPP ?[place] [place], [address] ?[place], [place], [address] ?Phone: [phone], Fax: [phone]  ?

## 2021-09-12 NOTE — Patient Instructions (Addendum)
It was nice meeting you today ? ?We would like your blood pressure to be less than 130/80 ? ?Please continue your: ?Carvedilol 25mg  twice a day ?Clonidine patch ?Chlorthalidone 25mg  daily ?Nifedipine 60mg  daily (2 tablets) ?Hydralazine 100mg  three times a day ?Torsemide 20mg  daily ?Isosorbide 60mg  1 and 1/2 tablets ? ?Please call with any questions ? ?Karren Cobble, PharmD, BCACP, [place], CPP ?[address], [address] ?[place], [place], [address] ?Phone: [phone], Fax: [phone]  ? ?

## 2021-09-13 DIAGNOSIS — Z419 Encounter for procedure for purposes other than remedying health state, unspecified: Secondary | ICD-10-CM | POA: Diagnosis not present

## 2021-09-16 ENCOUNTER — Telehealth: Payer: Self-pay | Admitting: *Deleted

## 2021-09-16 NOTE — Telephone Encounter (Signed)
Patient notified CPAP titration has been completed and order for her CPAP machine will be sent to [facility] for processing. Once they have received approval from her insurance they will contact her for set up. Patient voiced understanding and thanked me for calling. ?

## 2021-09-16 NOTE — Telephone Encounter (Signed)
-----   Message from Troy Sine, MD sent at 09/10/2021  9:43 AM EDT ----- ?Mariann Laster, please notify pt and set up with DME for CPAP initiation. ?

## 2021-09-17 ENCOUNTER — Encounter: Payer: Self-pay | Admitting: Gastroenterology

## 2021-09-18 DIAGNOSIS — M25561 Pain in right knee: Secondary | ICD-10-CM | POA: Diagnosis not present

## 2021-09-18 NOTE — Progress Notes (Signed)
Maria Hudson, we'll get her in.  Can we make Mrs Norgren an APP appt? Thanks.  ?

## 2021-09-25 DIAGNOSIS — Z5181 Encounter for therapeutic drug level monitoring: Secondary | ICD-10-CM | POA: Diagnosis not present

## 2021-09-25 DIAGNOSIS — I129 Hypertensive chronic kidney disease with stage 1 through stage 4 chronic kidney disease, or unspecified chronic kidney disease: Secondary | ICD-10-CM | POA: Diagnosis not present

## 2021-09-25 DIAGNOSIS — I5032 Chronic diastolic (congestive) heart failure: Secondary | ICD-10-CM | POA: Diagnosis not present

## 2021-09-25 DIAGNOSIS — I10 Essential (primary) hypertension: Secondary | ICD-10-CM | POA: Diagnosis not present

## 2021-09-25 DIAGNOSIS — N184 Chronic kidney disease, stage 4 (severe): Secondary | ICD-10-CM | POA: Diagnosis not present

## 2021-09-25 DIAGNOSIS — E1122 Type 2 diabetes mellitus with diabetic chronic kidney disease: Secondary | ICD-10-CM | POA: Diagnosis not present

## 2021-09-25 DIAGNOSIS — N179 Acute kidney failure, unspecified: Secondary | ICD-10-CM | POA: Diagnosis not present

## 2021-09-25 DIAGNOSIS — E7800 Pure hypercholesterolemia, unspecified: Secondary | ICD-10-CM | POA: Diagnosis not present

## 2021-09-26 LAB — COMPREHENSIVE METABOLIC PANEL
ALT: 10 IU/L (ref 0–32)
AST: 17 IU/L (ref 0–40)
Albumin/Globulin Ratio: 1.5 (ref 1.2–2.2)
Albumin: 4 g/dL (ref 3.8–4.9)
Alkaline Phosphatase: 59 IU/L (ref 44–121)
BUN/Creatinine Ratio: 30 — ABNORMAL HIGH (ref 9–23)
BUN: 81 mg/dL (ref 6–24)
Bilirubin Total: <0.2 mg/dL (ref 0.0–1.2)
CO2: 22 mmol/L (ref 20–29)
Calcium: 9.1 mg/dL (ref 8.7–10.2)
Chloride: 106 mmol/L (ref 96–106)
Creatinine, Ser: 2.69 mg/dL — ABNORMAL HIGH (ref 0.57–1.00)
Globulin, Total: 2.6 g/dL (ref 1.5–4.5)
Glucose: 90 mg/dL (ref 70–99)
Potassium: 4.5 mmol/L (ref 3.5–5.2)
Sodium: 143 mmol/L (ref 134–144)
Total Protein: 6.6 g/dL (ref 6.0–8.5)
eGFR: 20 mL/min/{1.73_m2} — ABNORMAL LOW (ref >59)

## 2021-09-26 LAB — LIPID PANEL
Chol/HDL Ratio: 2.7 ratio (ref 0.0–4.4)
Cholesterol, Total: 156 mg/dL (ref 100–199)
HDL: 58 mg/dL (ref >39)
LDL Chol Calc (NIH): 78 mg/dL (ref 0–99)
Triglycerides: 109 mg/dL (ref 0–149)
VLDL Cholesterol Cal: 20 mg/dL (ref 5–40)

## 2021-09-30 ENCOUNTER — Encounter: Payer: Self-pay | Admitting: Pharmacist

## 2021-09-30 ENCOUNTER — Ambulatory Visit: Payer: Self-pay

## 2021-09-30 ENCOUNTER — Ambulatory Visit (INDEPENDENT_AMBULATORY_CARE_PROVIDER_SITE_OTHER): Payer: Medicaid Other | Admitting: Pharmacist

## 2021-09-30 VITALS — BP 120/70 | HR 70 | Resp 15 | Ht 63.5 in | Wt 231.4 lb

## 2021-09-30 DIAGNOSIS — N1832 Chronic kidney disease, stage 3b: Secondary | ICD-10-CM

## 2021-09-30 DIAGNOSIS — E1122 Type 2 diabetes mellitus with diabetic chronic kidney disease: Secondary | ICD-10-CM | POA: Diagnosis not present

## 2021-09-30 DIAGNOSIS — I10 Essential (primary) hypertension: Secondary | ICD-10-CM

## 2021-09-30 DIAGNOSIS — E113513 Type 2 diabetes mellitus with proliferative diabetic retinopathy with macular edema, bilateral: Secondary | ICD-10-CM | POA: Diagnosis not present

## 2021-09-30 DIAGNOSIS — Z794 Long term (current) use of insulin: Secondary | ICD-10-CM

## 2021-09-30 DIAGNOSIS — E1142 Type 2 diabetes mellitus with diabetic polyneuropathy: Secondary | ICD-10-CM

## 2021-09-30 DIAGNOSIS — I5033 Acute on chronic diastolic (congestive) heart failure: Secondary | ICD-10-CM

## 2021-09-30 DIAGNOSIS — G4733 Obstructive sleep apnea (adult) (pediatric): Secondary | ICD-10-CM | POA: Diagnosis not present

## 2021-09-30 NOTE — Progress Notes (Signed)
Patient ID: Maria Hudson                 DOB: [birth year]                      MRN: [id number] ? ? ? ? ?HPI: ?Yoona Ishii is a 61 y.o. female referred by Dr. Oval Linsey to HTN clinic. PMH is significant for CVA, CAD, CHF, HTN, T2DM, and CKD. Patient reported to HTN clinic 2 weeks ago but accidentally had taken extra clonidine and was hypotensive at the time. ? ?Patient presents today in good spirits.  Reports blod pressure has been variable and is typically elevated when she wakes up but reduces after her morning medications.  Managed on multiple antihypertensives and reports compliance and no adverse effects. ? ?Patient feels well and is trying to lose weight. Is watching her portions and reducing salt. ? ?Current HTN meds:  ?Carvedilol 25mg  twice a day ?Clonidine patch 0.3mg  ?Chlorthalidone 25mg  daily ?Nifedipine 120mg  daily ?Hydralazine 100mg  TID ?Torsemide 20mg  daily ?Isosorbide 90mg  daily ? ?BP goal: <130/80 ? ? ?Wt Readings from Last 3 Encounters:  ?09/12/21 228 lb (103.4 kg)  ?09/08/21 228 lb (103.4 kg)  ?09/03/21 228 lb (103.4 kg)  ? ?BP Readings from Last 3 Encounters:  ?09/12/21 (!) 107/58  ?09/08/21 (!) 144/71  ?09/03/21 (!) 196/75  ? ?Pulse Readings from Last 3 Encounters:  ?09/12/21 (!) 58  ?09/08/21 (!) 58  ?09/03/21 70  ? ? ?Renal function: ?CrCl cannot be calculated (Unknown ideal weight.). ? ?Past Medical History:  ?Diagnosis Date  ? Acute on chronic diastolic heart failure ([facility]) 03/05/2021  ? Anemia   ? Arthritis   ? CAD (coronary artery disease)   ? CHF (congestive heart failure) ([facility])   ? CKD (chronic kidney disease) stage 4, GFR 15-29 ml/min ([facility]) 03/05/2021  ? Colon polyps   ? CVA (cerebral vascular accident) [facility])   ? Depression   ? Diabetes mellitus without complication ([facility])   ? Hypertension   ? Hypertensive urgency 04/14/2021  ? OSA (obstructive sleep apnea) 05/27/2021  ? Pneumonia   ? Pure hypercholesterolemia 05/27/2021  ? Renal disorder   ? 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 Sleep  apnea   ? Stroke [facility])   ? ? ?Current Outpatient Medications on File Prior to Visit  ?Medication Sig Dispense Refill  ? Accu-Chek Softclix Lancets lancets 3 (three) times daily.    ? acetaminophen (TYLENOL) 500 MG tablet Take 1,000 mg by mouth every 6 (six) hours as needed for moderate pain or headache.    ? allopurinol (ZYLOPRIM) 100 MG tablet Take 100 mg by mouth in the morning.    ? aspirin EC 81 MG tablet Take 81 mg by mouth in the morning. Swallow whole.    ? atorvastatin (LIPITOR) 40 MG tablet Take 1 tablet (40 mg total) by mouth every evening. 90 tablet 3  ? buPROPion (WELLBUTRIN) 100 MG tablet Take 100 mg by mouth 2 (two) times daily.    ? carvedilol (COREG) 25 MG tablet Take 1 tablet (25 mg total) by mouth 2 (two) times daily with a meal. 60 tablet 0  ? chlorthalidone (HYGROTON) 25 MG tablet Take 1 tablet (25 mg total) by mouth daily. 90 tablet 3  ? cloNIDine (CATAPRES - DOSED IN MG/24 HR) 0.3 mg/24hr patch Place 1 patch (0.3 mg total) onto the skin once a week. (Patient taking differently: Place 0.3 mg onto the skin every Thursday.) 4 patch 12  ? diclofenac Sodium (VOLTAREN) 1 %  GEL Apply 2 g topically 4 (four) times daily as needed (chest pain.).    ? doxycycline (VIBRAMYCIN) 100 MG capsule Take 1 capsule (100 mg total) by mouth 2 (two) times daily. 14 capsule 0  ? ferrous sulfate 325 (65 FE) MG tablet Take 325 mg by mouth 2 (two) times daily.    ? fluticasone (FLONASE) 50 MCG/ACT nasal spray Place 1 spray into both nostrils daily as needed for allergies or rhinitis.    ? hydrALAZINE (APRESOLINE) 100 MG tablet Take 100 mg by mouth 3 (three) times daily.    ? insulin glargine (LANTUS) 100 unit/mL SOPN Inject 10 Units into the skin at bedtime.    ? isosorbide mononitrate (IMDUR) 60 MG 24 hr tablet Take 1.5 tablets (90 mg total) by mouth daily. (Patient taking differently: Take 90 mg by mouth every evening.) 135 tablet 3  ? loratadine (CLARITIN) 10 MG tablet Take 10 mg by mouth in the morning.    ? Multiple  Vitamin (MULTIVITAMIN ADULT) TABS Take 1 tablet by mouth in the morning.    ? NIFEdipine (ADALAT CC) 60 MG 24 hr tablet Take 2 tablets (120 mg total) by mouth daily. 60 tablet 11  ? nitroGLYCERIN (NITROSTAT) 0.4 MG SL tablet Place 1 tablet (0.4 mg total) under the tongue every 5 (five) minutes as needed for chest pain. 20 tablet 0  ? NOVOLOG FLEXPEN 100 UNIT/ML FlexPen Inject 8 Units into the skin 3 (three) times daily with meals.    ? Omega-3 Fatty Acids (FISH OIL) 1000 MG CAPS Take 1,000 mg by mouth in the morning.    ? oxyCODONE-acetaminophen (PERCOCET/ROXICET) 5-325 MG tablet Take 1 tablet by mouth 3 (three) times daily as needed for severe pain.    ? pantoprazole (PROTONIX) 40 MG tablet Take 40 mg by mouth daily as needed (upset stomach.).    ? pregabalin (LYRICA) 25 MG capsule Take 25 mg by mouth 2 (two) times daily.    ? sertraline (ZOLOFT) 50 MG tablet Take 50 mg by mouth in the morning.    ? torsemide (DEMADEX) 20 MG tablet Take 1 tablet (20 mg total) by mouth every other day. Patient takes 1 tablet by mouth every other day as needed for fluid retention. 15 tablet 3  ? traZODone (DESYREL) 50 MG tablet Take 50 mg by mouth at bedtime.    ? ?No current facility-administered medications on file prior to visit.  ? ? ?Allergies  ?Allergen Reactions  ? Hydrocodone Itching  ?  Patient able to tolerate when taken with Benadryl.  ? Oxycodone-Acetaminophen Itching  ?  Patient able to tolerate when taken with Benadryl.  ? ? ? ?Assessment/Plan: ? ?1. Hypertension -  Patient BP in room today 120/70 which is at goal of <130/80 and patient feels well. No medication changes needed at this time. ? ?2. DM/Weight loss - Patient most recent A1c 6.7 which is at goal of <7.0%.  Manages DM with diet. Is intolerant to metormin.  Is interested in starting Ozempic for blood sugar and wweight loss benefits.  Using demo pen, educated patient on mechanism of action, storage, site selection, priming pen, dialing to correct dosage, and  administration. Played educational video as well.  Counseled patient on common GI side effects and advised to eat small, healthy meals. Patient has no personal or family history of multiple endocrine neoplasia or medullaty thyroid cancer.  Will complete PA and contact patient when approved.  Patient voiced understanding. ? ?Continue: ?Carvedilol 25mg  twice a day ?Clonidine  patch 0.3mg  ?Chlorthalidone 25mg  daily ?Nifedipine 120mg  daily ?Hydralazine 100mg  TID ?Torsemide 20mg  daily ?Isosorbide 90mg  daily ? ?Complete PA for Ozempic and start at 0.25mg  sq every 7 days for 4 weeks then increase to 0.5mg .  ?

## 2021-09-30 NOTE — Patient Instructions (Addendum)
It was good seeing you again ? ?We would like to keep your blood pressure less than 130/80 ? ?Please continue  ?Carvedilol 25mg  twice a day ?Clonidine patch 0.3mg  ?Nifedipine 120mg  daily ?Hydralazine 100mg  three times a day ?Torsemide 20mg  every other day ?Isosorbide 90mg  daily ? ?We will also start a new medication called Ozempic.  You will inject 0.25mg  once a week for 4 weeks.  One week 5, you will increase to 0.5mg  once a week.  Remember to eat small meals to avoid stomach issues ? ?I will complete the prior authorization for you and contact you when it is approved ? ?Please call with any questions! ? ?Karren Cobble, PharmD, BCACP, [place], CPP ?[address], [address] ?[place], [place], [address] ?Phone: [phone], Fax: [phone]  ?

## 2021-10-01 MED ORDER — OZEMPIC (0.25 OR 0.5 MG/DOSE) 2 MG/3ML ~~LOC~~ SOPN: PEN_INJECTOR | SUBCUTANEOUS | 2 refills | Status: DC

## 2021-10-02 ENCOUNTER — Telehealth: Payer: Self-pay | Admitting: Pharmacist

## 2021-10-02 DIAGNOSIS — H35371 Puckering of macula, right eye: Secondary | ICD-10-CM | POA: Diagnosis not present

## 2021-10-02 DIAGNOSIS — E113511 Type 2 diabetes mellitus with proliferative diabetic retinopathy with macular edema, right eye: Secondary | ICD-10-CM | POA: Diagnosis not present

## 2021-10-02 DIAGNOSIS — E113592 Type 2 diabetes mellitus with proliferative diabetic retinopathy without macular edema, left eye: Secondary | ICD-10-CM | POA: Diagnosis not present

## 2021-10-02 DIAGNOSIS — H35033 Hypertensive retinopathy, bilateral: Secondary | ICD-10-CM | POA: Diagnosis not present

## 2021-10-02 NOTE — Telephone Encounter (Signed)
Called patient to let her know her Ozempic was approved ?

## 2021-10-03 DIAGNOSIS — H5213 Myopia, bilateral: Secondary | ICD-10-CM | POA: Diagnosis not present

## 2021-10-06 DIAGNOSIS — M545 Low back pain, unspecified: Secondary | ICD-10-CM | POA: Diagnosis not present

## 2021-10-07 ENCOUNTER — Other Ambulatory Visit: Payer: Self-pay | Admitting: *Deleted

## 2021-10-07 NOTE — Patient Outreach (Addendum)
Medicaid Managed Care   Nurse Care Manager Note  10/07/2021 Name:  Maria Hudson MRN:  [id number] DOB:  [birth year]  Maria Hudson is an 61 y.o. year old female who is a primary patient of Fleet Contras, MD.  The Medicaid Managed Care Coordination team was consulted for assistance with:    CHF CAD HTN DMI CKD Stage 4 Depression Obesity  Ms. Remer was given information about Medicaid Managed Care Coordination team services today. Maria Hudson Patient agreed to services and verbal consent obtained.  [name] for follow up visit in response to provider referral for case management and/or care coordination services.   Assessments/Interventions:  Review of past medical history, allergies, medications, health status, including review of consultants reports, laboratory and other test data, was performed as part of comprehensive evaluation and provision of chronic care management services.  SDOH (Social Determinants of Health) assessments and interventions performed:   Care Plan  Allergies  Allergen Reactions   Metformin And Related Nausea Only   Hydrocodone Itching    Patient able to tolerate when taken with Benadryl.   Oxycodone-Acetaminophen Itching    Patient able to tolerate when taken with Benadryl.    Medications Reviewed Today     Reviewed by Bary Richard, RN (Registered Nurse) on 10/07/21 at 1013  Med List Status: <None>   Medication Order Taking? Sig Documenting Provider Last Dose Status Informant  Accu-Chek Softclix Lancets lancets [id number] No 3 (three) times daily. [provider] Taking Active Self  acetaminophen (TYLENOL) 500 MG tablet [id number] No Take 1,000 mg by mouth every 6 (six) hours as needed for moderate pain or headache. [provider] Taking Active Self  allopurinol (ZYLOPRIM) 100 MG tablet [id number] No Take 100 mg by mouth in the morning. [provider] Taking Active Self  aspirin EC 81 MG tablet  [id number] No Take 81 mg by mouth in the morning. Swallow whole. [provider] Taking Active Self           Med Note [name]   Mon Apr 14, 2021  9:22 PM)    atorvastatin (LIPITOR) 40 MG tablet [id number] No Take 1 tablet (40 mg total) by mouth every evening. Chilton Si, MD Taking Active Self  buPROPion [facility]) 100 MG tablet [id number] No Take 100 mg by mouth 2 (two) times daily. [provider] Taking Active Self  carvedilol (COREG) 25 MG tablet [id number] No Take 1 tablet (25 mg total) by mouth 2 (two) times daily with a meal. [name], York Ram, MD Taking Active Self  cholecalciferol (VITAMIN D3) 25 MCG (1000 UNIT) tablet [id number] No Take 1,000 Units by mouth daily. [provider] Taking Active   cloNIDine (CATAPRES - DOSED IN MG/24 HR) 0.3 mg/24hr patch [id number] No Place 1 patch (0.3 mg total) onto the skin once a week.  Patient taking differently: Place 0.3 mg onto the skin every Thursday.   Chilton Si, MD Taking Active Self  diclofenac Sodium (VOLTAREN) 1 % GEL [id number] No Apply 2 g topically 4 (four) times daily as needed (chest pain.). [provider] Taking Active Self  doxycycline (VIBRAMYCIN) 100 MG capsule [id number] No Take 1 capsule (100 mg total) by mouth 2 (two) times daily. Mardella Layman, MD Taking Active Self           Med Note Hetty Ely Sep 03, 2021  3:54 PM) New medication patient to start 7 day therapy course on 09/03/21  ferrous sulfate 325 (65 FE)  MG tablet [id number] No Take 325 mg by mouth 2 (two) times daily. [provider] Taking Active Self  fluticasone (FLONASE) 50 MCG/ACT nasal spray [id number] No Place 1 spray into both nostrils daily as needed for allergies or rhinitis. [provider] Taking Active Self  hydrALAZINE (APRESOLINE) 100 MG tablet [id number] No Take 100 mg by mouth 3 (three) times daily. [provider] Taking Active Self  insulin glargine (LANTUS)  100 unit/mL SOPN [id number] No Inject 10 Units into the skin at bedtime. [provider] Taking Active Self           Med Note Suezanne Cheshire   Tue Aug 19, 2021  9:37 AM)    isosorbide mononitrate (IMDUR) 60 MG 24 hr tablet [id number] No Take 1.5 tablets (90 mg total) by mouth daily.  Patient taking differently: Take 90 mg by mouth every evening.   Noel, Anderson Malta, FNP Taking Active Self  loratadine (CLARITIN) 10 MG tablet [id number] No Take 10 mg by mouth in the morning. [provider] Taking Active Self  Multiple Vitamin (MULTIVITAMIN ADULT) TABS [id number] No Take 1 tablet by mouth in the morning. [provider] Taking Active Self  NIFEdipine (ADALAT CC) 60 MG 24 hr tablet [id number] No Take 2 tablets (120 mg total) by mouth daily. Laurey Morale, MD Taking Active Self  nitroGLYCERIN (NITROSTAT) 0.4 MG SL tablet [id number] No Place 1 tablet (0.4 mg total) under the tongue every 5 (five) minutes as needed for chest pain. [name], York Ram, MD Taking Active Self           Med Note Kittie Plater, Smitty Cords   Tue Aug 19, 2021  9:35 AM)    Rubbie Battiest FLEXPEN 100 UNIT/ML FlexPen [id number] No Inject 8 Units into the skin 3 (three) times daily with meals. [provider] Taking Active Self           Med Note Gunnar Fusi, MELISSA R   Tue May 06, 2021  5:01 PM)    Omega-3 Fatty Acids (FISH OIL) 1000 MG CAPS [id number] No Take 1,000 mg by mouth in the morning. [provider] Taking Active Self  oxyCODONE-acetaminophen (PERCOCET/ROXICET) 5-325 MG tablet [id number] No Take 1 tablet by mouth 3 (three) times daily as needed for severe pain. [provider] Taking Active   pantoprazole (PROTONIX) 40 MG tablet [id number] No Take 40 mg by mouth daily as needed (upset stomach.). [provider] Taking Active Self  pregabalin (LYRICA) 25 MG capsule [id number] No Take 25 mg by mouth 2 (two) times daily. [provider] Taking Active Self   Semaglutide,0.25 or 0.5MG /DOS, (OZEMPIC, 0.25 OR 0.5 MG/DOSE,) 2 MG/3ML SOPN [id number]  Inject 0.25mg  under the skin once weekly for 4 weeks and then inject 0.5mg  once weekly Chilton Si, MD  Active   sertraline (ZOLOFT) 50 MG tablet [id number] No Take 50 mg by mouth in the morning. [provider] Taking Active Self  torsemide (DEMADEX) 20 MG tablet [id number] No Take 1 tablet (20 mg total) by mouth every other day. Patient takes 1 tablet by mouth every other day as needed for fluid retention. Sedan, Anderson Malta, FNP Taking Active Self  traZODone (DESYREL) 50 MG tablet [id number] No Take 50 mg by mouth at bedtime. [provider] Taking Active Self            Patient Active Problem List   Diagnosis Date Noted   Pure hypercholesterolemia 05/27/2021   Chronic diastolic CHF (congestive heart failure) (HCC) 04/29/2021  Hypertensive urgency 04/14/2021   Acute on chronic diastolic CHF (congestive heart failure) (HCC) 04/14/2021   Chest pain 03/05/2021   CAD (coronary artery disease) 03/05/2021   T2DM (type 2 diabetes mellitus) (HCC) 03/05/2021   CKD (chronic kidney disease) stage 4, GFR 15-29 ml/min (HCC) 03/05/2021   Class 3 obesity (HCC) 03/05/2021   CVA (cerebral vascular accident) (HCC) 03/05/2021   Acute on chronic diastolic heart failure (HCC) 03/05/2021   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   AKI (acute kidney injury) (HCC) 03/05/2021   Vitreous hemorrhage of left eye (HCC) 11/14/2020   Orthopnea 08/06/2020   Vision loss of right eye 08/06/2020   Excessive daytime sleepiness 01/11/2020   Iron deficiency anemia 12/14/2019   Vitreous hemorrhage, right (HCC) 12/04/2019   Diabetic neuropathy (HCC) 07/26/2019   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) 02/03/2019   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   Conductive hearing loss, bilateral 03/03/2018   Chronic bilateral low back pain without  sciatica 08/21/2015   Chronic kidney disease (CKD) stage G3b/A3, moderately decreased glomerular filtration rate (GFR) between 30-44 mL/min/1.73 square meter and albuminuria creatinine ratio greater than 300 mg/g (HCC) 04/30/2015   Type 2 diabetes mellitus with diabetic polyneuropathy, with long-term current use of insulin (HCC) 02/15/2015   Advance directive on file 05/17/2014   Refusal of blood transfusions as patient is Jehovah's Witness 05/17/2014   OSA treated with BiPAP 02/23/2014   Coronary artery disease 07/07/2013   Severe obesity (BMI >= 40) (HCC) 05/18/2013   Neuropathy 04/11/2013   Diastolic heart failure (HCC) 10/16/2012   Essential hypertension 10/16/2012     Care Plan : RN Care Manager Plan Of Care  Updates made by Bary Richard, RN since 10/07/2021 12:00 AM     Problem: Knowledge Deficit and Care Coordination Needs Related to Management of HTN, HF, Type 2 DM   Priority: High     Long-Range Goal: Development of Plan of Care to Address Knowledge Deficits and Care Coordination Needs related to  management of CHF, CAD, HTN, HLD, DMII, CKD Stage 4, and Depression   Start Date: 05/06/2021  Expected End Date: 05/06/2022  Priority: High  Note:   Current Barriers:  Knowledge Deficits related to plan of care for management of CHF, CAD, HTN, HLD, DMII, CKD Stage 4, and Depression: depressed mood loss of energy/fatigue insomnia disturbed sleep decreased appetite difficulty concentrating recurrent thoughts of death  Care Coordination needs related to Housing barriers and Mental Health Concerns  and personal care services Patient says she will see her primary care provider Dr. Concepcion Elk on 10/09/21 and will ask for dermatology referral as the epidermoid cyst on her back has recurred and she says it needs to be excised, says she went to urgent care on 3/22 as it was infected and tender and had I&D and completed 7 days of Vibramycin  Reports she missed the appointment with  psychiatric due to insurance transportation issues and she left message with office requesting to reschedule, she was referred to [facility]) by her PCP to assess and treat her depression and hx of recurrent thoughts of death. Chart review reveals patient saw [facility] LCSW on 3/13 Patient says her fractured right knee cap is slowly healing and she has been referred to OP PT by her orthopedist Dr. Althea Charon. Says she is anxious to be able to get daily exercise by walking to help with weight management.  Patient says she is no  longer pursuing a place of her own because her nephrologist told her she needs HD access placed for home hemodialysis and someone must be with her when she is receiving the treatment.  Patient says she saw eye MD and ws told she has swelling behind her retina in the right eye, no medications were prescribed but she was prescribed glasses, she is to follow up with eye MD on regular basis to monitor retinal swelling RNCM Clinical Goal(s):  Patient will demonstrate ongoing adherence to prescribed treatment plan for CHF, CAD, HTN, HLD, DMII, CKD Stage 4, and Depression as evidenced by patient report of improved health and quality of life and no unplanned ED or unplanned hospital admissions continue to work with RN Care Manager and/or Social Worker to address care management and care coordination needs related to CHF, CAD, HTN, HLD, DMII, CKD Stage 4, and Depression as evidenced by adherence to CM Team Scheduled appointments      Work with [facility] LCSW and psychiatrist to treat depression  Interventions: Inter-disciplinary care team collaboration (see longitudinal plan of care) Evaluation of current treatment plan related to  self management and patient's adherence to plan as established by provider Advised patient role of RNCM will transition to Kathi Der and follow up telephone appointment scheduled  for 11/12/21 at 9:00 am Mailed summary of benefits for  her [facility] Managed Medicaid plan to her home address     Heart Failure Interventions:  (Status: Goal on Track (progressing): YES.)  Long Term Goal - patient states her fluid retention has significantly improved,  says she cannot tolerate the compression stockings because they are too tight and make her legs hurt Assessed patient's heart failure management strategies  Diabetes:  (Status: Goal on Track (progressing): YES.) Long Term Goal - patient says her CBGS have been quite variable and she is not sure why, denies hypoglycemia or sustained hyperglycemia  Lab Results  Component Value Date   HGBA1C 6.7 (H) 04/29/2021    Assessed patient's understanding of A1C goal: <7% Reviewed medications with patient and discussed importance of medication adherence;        Counseled on importance of regular laboratory monitoring as prescribed;        Discussed plans with patient for ongoing care management follow up and provided patient with direct contact information for care management team;      Advised patient, providing education and rationale, to check cbg three times daily and record        call provider for findings outside established parameters;       Review of patient status, including review of consultants reports, relevant laboratory and other test results, and medications completed;       Discussed addition of Ozempic to medication regimen , reviewed mechanism of action and proven cardiovascular benefits  Hypertension and CKD stage 4 : (Status: Goal on Track (progressing): YES.) Long Term Goal- per patient blood pressure continues to be very difficult to control due to orthostatic hypotension episodes and elevated when sitting- patient is now working with cardiology pharmacist to assist with management, patient says she will have HD access placed in the near future and has been placed on kidney transplant waiting list Last practice recorded BP readings:   BP Readings from Last 3 Encounters:   09/30/21 120/70  09/12/21 (!) 107/58  09/08/21 (!) 144/71     Most recent eGFR/CrCl:    No components found for: CRCL Lab Results  Component Value Date   NA 136 08/19/2021  K 3.9 08/19/2021   CREATININE 2.51 (H) 08/19/2021   EGFR 19 (L) 03/13/2021   GFRNONAA 22 (L) 08/19/2021   GLUCOSE 122 (H) 08/19/2021    Evaluation of current treatment plan related to hypertension self management and patient's adherence to plan as established by provider;   Reviewed medications with patient and discussed importance of compliance;  Discussed plans with patient for ongoing care management follow up and provided patient with direct contact information for care management team; Advised patient, providing education and rationale, to monitor blood pressure daily and record, calling PCP for findings outside established parameters;  Reviewed scheduled/upcoming provider appointments including: with Dr Lenna Sciara (PCP) on 4/27, with Managed Medicaid BSW telephone appointment on 5/5, with Dr. Valentino Nose (nephro) on 5/18, with Dr Althea Charon (Ortho)  on 5/27, with HF clinic on 11/19/21, ensured she has transportation   Weight Loss Interventions:  (Status:  New goal.) Long Term Goal- patient voiced concern about recent 15 lb weight gain since kneecap fx on 2/13, she says she would like to resume Victoza or another drug in that class to help her with weight loss, glycemic control and to decrease risk of stroke, MI and CV death Discussed indications, mechanism of action, common side effects and CV benefits  of GLP1 Ozempic Assessed tolerance of Ozempic  Interdisciplinary Collaboration:  (Status: Goal on Track (progressing): YES.) Long Term Goal  Collaborated with BSW to initiate plan of care to address needs related to Housing barriers, Inability to perform ADL's independently, and Inability to perform IADL's independently in patient with CHF, CAD, HTN, HLD, DM, CKD Stage 4, and Depression Assessed status of patient's  involvement with behavioral health counselor Collaboration with pharmacist for medication review, discussion of ? Medication side effects causing chronic constipation and arranging for patient to received home delivery of medications in blister packs Collaboration with Fleet Contras, MD regarding development and update of comprehensive plan of care as evidenced by provider attestation and co-signature Inter-disciplinary care team collaboration (see longitudinal plan of care)  Patient Goals/Self-Care Activities: Take medications as prescribed   Attend all scheduled provider appointments Call pharmacy for medication refills 3-7 days in advance of running out of medications Attend church or other social activities Perform all self care activities independently  Call provider office for new concerns or questions  Work with the social worker to address care coordination needs and will continue to work with the clinical team to address health care and disease management related needs call office if I gain more than 2 pounds in one day or 5 pounds in one week keep legs up while sitting track weight in diary use salt in moderation watch for swelling in feet, ankles and legs every day weigh myself daily know when to call the doctorfor fluid weight gain or consistently abnormal BP readings at home track symptoms and what helps feel better or worse check blood sugar at prescribed times: three times daily enter blood sugar readings and medication or insulin into daily log take the blood sugar log to all doctor visits take the blood sugar meter to all doctor visits eat fish at least once per week fill half of plate with vegetables manage portion size check blood pressure daily write blood pressure results in a log or diary learn about high blood pressure take blood pressure log to all doctor appointments call doctor for signs and symptoms of high blood pressure keep all doctor appointments take  medications for blood pressure exactly as prescribed Limit fluid intake to 1 liter  per day Collaborate with managed Medicaid team of BSW and pharmacist to address identified needs Work with cardiology pharmacist to assist with BP stabilization Work with [facility] LCSW and psychiatrist to treat depression Review summary of [employer] benefits mailed to home address and call to enroll in program that will benefit your chronic health issues Continue to use [facility] Care Management calendar to record weight , BP and CBGs so readings are all in one place        Follow Up:  Patient agrees to Care Plan and Follow-up.  Plan: The Managed Medicaid care management team will reach out to the patient again over the next 30-45 days.  Date/time of next scheduled RN care management/care coordination outreach:  telephone appointment with Kathi Der RNCM on 11/12/21 at 9:00 am  Cranford Mon RN, [name], CDCES [place]  [facility] Care Management Coordinator - Managed [place] High Risk [phone]

## 2021-10-07 NOTE — Patient Instructions (Signed)
Visit Information ? ?Maria Hudson was given information about Medicaid Managed Care team care coordination services as a part of their [facility] Medicaid benefit. Maria Hudson verbally consented to engagement with the [facility] Managed Care team.  ? ?If you are experiencing a medical emergency, please call 911 or report to your local emergency department or urgent care.  ? ?If you have a non-emergency medical problem during routine business hours, please contact your provider's office and ask to speak with a nurse.  ? ?For questions related to your [facility] health plan, please call: [phone] or go here:[email or web address] ? ?If you would like to schedule transportation through your [facility] plan, please call the following number at least 2 days in advance of your appointment: [phone]. ? You can also use the MTM portal or MTM mobile app to manage your rides. For the portal, please go to [email or web address]. ? ?Call the [place] at [phone], at any time, 24 hours a day, 7 days a week. If you are in danger or need immediate medical attention call 911. ? ?If you would like help to quit smoking, call 1-800-QUIT-NOW 316-323-8574) OR Espa?ol: 1-855-D?jelo-Ya (669)096-5280) o para m?s informaci?n haga clic aqu? or Text READY to 200-400 to register via text ? ?Maria Hudson - following are the goals we discussed in your visit today:  ? Goals Addressed   ?ake medications as prescribed   ?Attend all scheduled provider appointments ?Call pharmacy for medication refills 3-7 days in advance of running out of medications ?Attend church or other social activities ?Perform all self care activities independently  ?Call provider office for new concerns or questions  ?Work with the Education officer, museum to address care coordination needs and will continue to work with the clinical team to address health care and disease management related needs ?call office if I gain more than 2 pounds in  one day or 5 pounds in one week ?keep legs up while sitting ?track weight in diary ?use salt in moderation ?watch for swelling in feet, ankles and legs every day ?weigh myself daily ?know when to call the doctorfor fluid weight gain or consistently abnormal BP readings at home ?track symptoms and what helps feel better or worse ?check blood sugar at prescribed times: three times daily ?enter blood sugar readings and medication or insulin into daily log ?take the blood sugar log to all doctor visits ?take the blood sugar meter to all doctor visits ?eat fish at least once per week ?fill half of plate with vegetables ?manage portion size ?check blood pressure daily ?write blood pressure results in a log or diary ?learn about high blood pressure ?take blood pressure log to all doctor appointments ?call doctor for signs and symptoms of high blood pressure ?keep all doctor appointments ?take medications for blood pressure exactly as prescribed ?Limit fluid intake to 1 liter per day ?Collaborate with managed Medicaid team of BSW and pharmacist to address identified needs ?Work with cardiology pharmacist to assist with BP stabilization ?Work with [facility] LCSW and psychiatrist to treat depression ?Review summary of Wellcare benefits mailed to home address and call to enroll in program that will benefit your chronic health issues ?Continue to use [place] Management calendar to record weight , BP and CBGs so readings are all in one place ? ? ?Please see education materials related to [facility] benefit summary provided as Advertising account planner.  ? ?Patient verbalizes understanding of instructions and care plan provided today and agrees to view in [place]. Active  MyChart status confirmed with patient.   ? ?The Managed Medicaid care management team will reach out to the patient again over the next 30-45 days.  ? ?[name], [name], CDCES ?[place] [facility] ?Care Management Coordinator -  Managed Medicaid High Risk ?[phone]  ? ?Following is a copy of your plan of care:  ?Care Plan : [place]  ?Updates made by [name], RN since 10/07/2021 12:00 AM  ?  ? ?Problem: Knowledge Deficit and Care Coordination Needs Related to Management of HTN, HF, Type 2 DM   ?Priority: High  ?  ? ?Long-Range Goal: Development of Plan of Care to Address Knowledge Deficits and Care Coordination Needs related to  management of CHF, CAD, HTN, HLD, DMII, CKD Stage 4, and Depression   ?Start Date: 05/06/2021  ?Expected End Date: 05/06/2022  ?Priority: High  ?Note:   ?Current Barriers:  ?Knowledge Deficits related to plan of care for management of CHF, CAD, HTN, HLD, DMII, CKD Stage 4, and Depression: depressed mood ?loss of energy/fatigue ?insomnia ?disturbed sleep ?decreased appetite ?difficulty concentrating ?recurrent thoughts of death  ?Care Coordination needs related to Housing barriers and Mental Health Concerns  and personal care services ?Patient says she will see her primary care provider Dr. Jeanie Cooks on 4/278/23 and will ask for dermatology referral as the epidermoid cyst on her back has recurred and she says it needs to be excised, says she went to urgent care on 3/22 as it was infected and tender and had I&D and completed 7 days of Vibramycin ? Reports she missed the appointment with psychiatric due to insurance transportation issues and she left message with office requesting to reschedule, she was referred to [facility]) by her PCP to assess and treat her depression and hx of recurrent thoughts of death. Chart review reveals patient saw [facility] LCSW on 3/13 ?Patient says her fractured right knee cap is slowly healing and she has been referred to OP PT by her orthopedist Dr. Rip Harbour. Says she is anxious to be able to get daily exercise by walking to help with weight management.  ?Patient says she is no longer pursuing a place of her own because her  nephrologist told her she needs HD access placed for home hemodialysis and someone must be with her when she is receiving the treatment.  ?Patient says she saw eye MD and ws told she has swelling behind her retina in the right eye, no medications were prescribed but she was prescribed glasses, she is to follow up with eye MD on regular basis to monitor retinal swelling ?RNCM Clinical Goal(s):  ?Patient will demonstrate ongoing adherence to prescribed treatment plan for CHF, CAD, HTN, HLD, DMII, CKD Stage 4, and Depression as evidenced by patient report of improved health and quality of life and no unplanned ED or unplanned hospital admissions ?continue to work with RN Care Manager and/or Social Worker to address care management and care coordination needs related to CHF, CAD, HTN, HLD, DMII, CKD Stage 4, and Depression as evidenced by adherence to CM Team Scheduled appointments      ?Work with [facility] LCSW and psychiatrist to treat depression ? ?Interventions: ?Inter-disciplinary care team collaboration (see longitudinal plan of care) ?Evaluation of current treatment plan related to  self management and patient's adherence to plan as established by provider ?Advised patient role of RNCM will transition to Aida Raider and follow up telephone appointment scheduled  for 11/12/21 at  9:00 am ?Mailed summary of benefits for her Early Medicaid plan to her home address ?  ?  ?Heart Failure Interventions:  (Status: Goal on Track (progressing): YES.)  Long Term Goal - patient states her fluid retention has significantly improved,  says she cannot tolerate the compression stockings because they are too tight and make her legs hurt ?Assessed patient's heart failure management strategies ? ?Diabetes:  (Status: Goal on Track (progressing): YES.) Long Term Goal - patient says her CBGS have been quite variable and she is not sure why, denies hypoglycemia or sustained hyperglycemia ? ?Lab Results  ?Component Value Date  ?  HGBA1C 6.7 (H) 04/29/2021  ?  ?Assessed patient's understanding of A1C goal: <7% ?Reviewed medications with patient and discussed importance of medication adherence;        ?Counseled on importance of regular la

## 2021-10-09 DIAGNOSIS — E1122 Type 2 diabetes mellitus with diabetic chronic kidney disease: Secondary | ICD-10-CM | POA: Diagnosis not present

## 2021-10-09 DIAGNOSIS — G4730 Sleep apnea, unspecified: Secondary | ICD-10-CM | POA: Diagnosis not present

## 2021-10-09 DIAGNOSIS — I10 Essential (primary) hypertension: Secondary | ICD-10-CM | POA: Diagnosis not present

## 2021-10-09 DIAGNOSIS — I509 Heart failure, unspecified: Secondary | ICD-10-CM | POA: Diagnosis not present

## 2021-10-09 DIAGNOSIS — M19012 Primary osteoarthritis, left shoulder: Secondary | ICD-10-CM | POA: Diagnosis not present

## 2021-10-13 DIAGNOSIS — G4733 Obstructive sleep apnea (adult) (pediatric): Secondary | ICD-10-CM | POA: Diagnosis not present

## 2021-10-13 DIAGNOSIS — Z419 Encounter for procedure for purposes other than remedying health state, unspecified: Secondary | ICD-10-CM | POA: Diagnosis not present

## 2021-10-17 ENCOUNTER — Other Ambulatory Visit: Payer: Self-pay

## 2021-10-17 NOTE — Patient Instructions (Signed)
Visit Information ? ?Ms. Rossi was given information about Medicaid Managed Care team care coordination services as a part of their [facility] Medicaid benefit. Keziah Avis verbally consented to engagement with the [facility] Managed Care team.  ? ?If you are experiencing a medical emergency, please call 911 or report to your local emergency department or urgent care.  ? ?If you have a non-emergency medical problem during routine business hours, please contact your provider's office and ask to speak with a nurse.  ? ?For questions related to your [facility] health plan, please call: [phone] or go here:[email or web address] ? ?If you would like to schedule transportation through your [facility] plan, please call the following number at least 2 days in advance of your appointment: [phone]. ? You can also use the MTM portal or MTM mobile app to manage your rides. For the portal, please go to [email or web address]. ? ?Call the [place] at [phone], at any time, 24 hours a day, 7 days a week. If you are in danger or need immediate medical attention call 911. ? ?If you would like help to quit smoking, call 1-800-QUIT-NOW 986-257-9752) OR Espa?ol: 1-855-D?jelo-Ya (412) 191-1286) o para m?s informaci?n haga clic aqu? or Text READY to 200-400 to register via text ? ?Ms. Hoefer - following are the goals we discussed in your visit today:  ? Goals Addressed   ?None ?  ? ? ? ? ?The  Patient                                              has been provided with contact information for the Managed Medicaid care management team and has been advised to call with any health related questions or concerns.  ? ?Mickel Fuchs, BSW, MHA ?[place]  ?High Risk Managed Medicaid Team  ?[phone]  ? ?Following is a copy of your plan of care:  ?There are no care plans that you recently modified to display for this patient. ?  ?

## 2021-10-17 NOTE — Patient Outreach (Signed)
?Medicaid Managed Care ?Social Work Note ? ?10/17/2021 ?Name:  Maria Hudson MRN:  [id number] DOB:  Jun 02, 1961 ? ?Maria Hudson is an 61 y.o. year old female who is a primary patient of Nolene Ebbs, MD.  The Medicaid Managed Care Coordination team was consulted for assistance with:  Community Resources  ? ?[name] was given information about Medicaid Managed Care Coordination team services today. [name] Patient agreed to services and verbal consent obtained. ? ?Engaged with patient  for by telephone forfollow up visit in response to referral for case management and/or care coordination services.  ? ?Assessments/Interventions:  Review of past medical history, allergies, medications, health status, including review of consultants reports, laboratory and other test data, was performed as part of comprehensive evaluation and provision of chronic care management services. ? ?SDOH: (Social Determinant of Health) assessments and interventions performed: ?BSW completed a telephone outreach with patient. Patient stated that no resources are needed right now and she is doing good. ? ?Advanced Directives Status:  Not addressed in this encounter. ? ?Care Plan ?                ?Allergies  ?Allergen Reactions  ? Metformin And Related Nausea Only  ? Hydrocodone Itching  ?  Patient able to tolerate when taken with Benadryl.  ? Oxycodone-Acetaminophen Itching  ?  Patient able to tolerate when taken with Benadryl.  ? ? ?Medications Reviewed Today   ? ? Reviewed by [name], RN (Registered Nurse) on 10/07/21 at 1013  Med List Status: <None>  ? ?Medication Order Taking? Sig Documenting Provider Last Dose Status Informant  ?Accu-Chek Softclix Lancets lancets [id number] No 3 (three) times daily. [provider] Taking Active Self  ?acetaminophen (TYLENOL) 500 MG tablet [id number] No Take 1,000 mg by mouth every 6 (six) hours as needed for moderate pain or headache. [provider] Taking Active Self   ?allopurinol (ZYLOPRIM) 100 MG tablet [id number] No Take 100 mg by mouth in the morning. [provider] Taking Active Self  ?aspirin EC 81 MG tablet [id number] No Take 81 mg by mouth in the morning. Swallow whole. [provider] Taking Active Self  ?         ?Med Note Fransico Meadow Apr 14, 2021  9:22 PM)    ?atorvastatin (LIPITOR) 40 MG tablet [id number] No Take 1 tablet (40 mg total) by mouth every evening. Skeet Latch, MD Taking Active Self  ?buPROPion (WELLBUTRIN) 100 MG tablet [id number] No Take 100 mg by mouth 2 (two) times daily. [provider] Taking Active Self  ?carvedilol (COREG) 25 MG tablet [id number] No Take 1 tablet (25 mg total) by mouth 2 (two) times daily with a meal. [name], [name], MD Taking Active Self  ?cholecalciferol (VITAMIN D3) 25 MCG (1000 UNIT) tablet [id number] No Take 1,000 Units by mouth daily. [provider] Taking Active   ?cloNIDine (CATAPRES - DOSED IN MG/24 HR) 0.3 mg/24hr patch [id number] No Place 1 patch (0.3 mg total) onto the skin once a week.  ?Patient taking differently: Place 0.3 mg onto the skin every Thursday.  ? Skeet Latch, MD Taking Active Self  ?diclofenac Sodium (VOLTAREN) 1 % GEL [id number] No Apply 2 g topically 4 (four) times daily as needed (chest pain.). [provider] Taking Active Self  ?doxycycline (VIBRAMYCIN) 100 MG capsule [id number] No Take 1 capsule (100 mg total) by mouth 2 (two) times daily. Vanessa Kick, MD Taking Active Self  ?         ?  Med Note Jeanie Cooks Sep 03, 2021  3:54 PM) New medication patient to start 7 day therapy course on 09/03/21  ?ferrous sulfate 325 (65 FE) MG tablet [id number] No Take 325 mg by mouth 2 (two) times daily. [provider] Taking Active Self  ?fluticasone (FLONASE) 50 MCG/ACT nasal spray [id number] No Place 1 spray into both nostrils daily as needed for allergies or rhinitis. [provider] Taking Active Self   ?hydrALAZINE (APRESOLINE) 100 MG tablet [id number] No Take 100 mg by mouth 3 (three) times daily. [provider] Taking Active Self  ?insulin glargine (LANTUS) 100 unit/mL SOPN [id number] No Inject 10 Units into the skin at bedtime. [provider] Taking Active Self  ?         ?Med Note [facility] [place]   Tue Aug 19, 2021  9:37 AM)    ?isosorbide mononitrate (IMDUR) 60 MG 24 hr tablet [id number] No Take 1.5 tablets (90 mg total) by mouth daily.  ?Patient taking differently: Take 90 mg by mouth every evening.  ? Rafael Bihari, FNP Taking Active Self  ?loratadine (CLARITIN) 10 MG tablet [id number] No Take 10 mg by mouth in the morning. [provider] Taking Active Self  ?Multiple Vitamin (MULTIVITAMIN ADULT) TABS [id number] No Take 1 tablet by mouth in the morning. [provider] Taking Active Self  ?NIFEdipine (ADALAT CC) 60 MG 24 hr tablet [id number] No Take 2 tablets (120 mg total) by mouth daily. Larey Dresser, MD Taking Active Self  ?nitroGLYCERIN (NITROSTAT) 0.4 MG SL tablet [id number] No Place 1 tablet (0.4 mg total) under the tongue every 5 (five) minutes as needed for chest pain. [name], [name], MD Taking Active Self  ?         ?Med Note [facility] [place]   Tue Aug 19, 2021  9:35 AM)    ?NOVOLOG FLEXPEN 100 UNIT/ML FlexPen [id number] No Inject 8 Units into the skin 3 (three) times daily with meals. [provider] Taking Active Self  ?         ?Med Note Anabel Bene R   Tue May 06, 2021  5:01 PM)    ?Omega-3 Fatty Acids (FISH OIL) 1000 MG CAPS [id number] No Take 1,000 mg by mouth in the morning. [provider] Taking Active Self  ?oxyCODONE-acetaminophen (PERCOCET/ROXICET) 5-325 MG tablet [id number] No Take 1 tablet by mouth 3 (three) times daily as needed for severe pain. [provider] Taking Active   ?pantoprazole (PROTONIX) 40 MG tablet [id number] No Take 40 mg by mouth daily as needed (upset stomach.). [provider] Taking Active Self  ?pregabalin (LYRICA) 25 MG capsule [id number] No Take 25 mg by mouth 2 (two) times daily. [provider] Taking Active Self  ?Semaglutide,0.25 or 0.5MG /DOS, (OZEMPIC, 0.25 OR 0.5 MG/DOSE,) 2 MG/3ML SOPN [id number]  Inject 0.25mg  under the skin once weekly for 4 weeks and then inject 0.5mg  once weekly Skeet Latch, MD  Active   ?sertraline (ZOLOFT) 50 MG tablet [id number] No Take 50 mg by mouth in the morning. [provider] Taking Active Self  ?torsemide (DEMADEX) 20 MG tablet [id number] No Take 1 tablet (20 mg total) by mouth every other day. Patient takes 1 tablet by mouth every other day as needed for fluid retention. [place], Maricela Bo, FNP Taking Active Self  ?traZODone (DESYREL) 50 MG tablet [id number] No Take 50 mg by mouth at bedtime. [provider] Taking Active Self  ? ?  ?  ? ?  ? ? ?  Patient Active Problem List  ? Diagnosis Date Noted  ? Pure hypercholesterolemia 05/27/2021  ? Chronic diastolic CHF (congestive heart failure) ([place]) 04/29/2021  ? Hypertensive urgency 04/14/2021  ? Acute on chronic diastolic CHF (congestive heart failure) ([place]) 04/14/2021  ? Chest pain 03/05/2021  ? CAD (coronary artery disease) 03/05/2021  ? T2DM (type 2 diabetes mellitus) ([place]) 03/05/2021  ? CKD (chronic kidney disease) stage 4, GFR 15-29 ml/min (HCC) 03/05/2021  ? Class 3 obesity ([place]) 03/05/2021  ? CVA (cerebral vascular accident) ([place]) 03/05/2021  ? Acute on chronic diastolic heart failure ([place]) 03/05/2021  ? 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 AKI (acute kidney injury) ([place]) 03/05/2021  ? Vitreous hemorrhage of left eye ([place]) 11/14/2020  ? Orthopnea 08/06/2020  ? Vision loss of right eye 08/06/2020  ? Excessive daytime sleepiness 01/11/2020  ? Iron deficiency anemia 12/14/2019  ? Vitreous hemorrhage, right ([place]) 12/04/2019  ? Diabetic neuropathy ([place]) 07/26/2019  ? Proliferative diabetic retinopathy of both eyes with macular edema associated with type 2  diabetes mellitus ([place]) 02/03/2019  ? 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 Conductive hearing loss, bilateral 03/03/2018  ? Chronic bilateral low back pain without sciatica 08/21/2015  ? Chronic kidney disease (CKD) stage

## 2021-10-20 ENCOUNTER — Telehealth: Payer: Self-pay | Admitting: *Deleted

## 2021-10-20 DIAGNOSIS — N184 Chronic kidney disease, stage 4 (severe): Secondary | ICD-10-CM | POA: Diagnosis not present

## 2021-10-20 NOTE — Patient Outreach (Signed)
Care Coordination ? ?10/20/2021 ? ?Ikeisha Romaniello ?1960-10-19 ?[id number] ? ?Patient left voice message for this RNCM on 10/15/21 at 4:46 pm sating she received the summary of Wellcare benefits mailed to her from this [facility] and expressed appreciation for the information and for RNCM's assistance over the last 6 months. She also wished this RNCM well in retirement. ? ?[name], [name], CDCES ?[place] [facility] ?Care Management Coordinator - Managed Medicaid High Risk ?[phone]  ?

## 2021-10-21 NOTE — Therapy (Signed)
?OUTPATIENT PHYSICAL THERAPY THORACOLUMBAR EVALUATION ? ? ?Patient Name: Maria Hudson ?MRN: [id number] ?DOB:Jul 26, 1960, 61 y.o., female ?Today's Date: 10/21/2021 ? ? ? ?Past Medical History:  ?Diagnosis Date  ? Acute on chronic diastolic heart failure ([place]) 03/05/2021  ? Anemia   ? Arthritis   ? CAD (coronary artery disease)   ? CHF (congestive heart failure) ([place])   ? CKD (chronic kidney disease) stage 4, GFR 15-29 ml/min (HCC) 03/05/2021  ? Colon polyps   ? CVA (cerebral vascular accident) [facility])   ? Depression   ? Diabetes mellitus without complication ([place])   ? Hypertension   ? Hypertensive urgency 04/14/2021  ? OSA (obstructive sleep apnea) 05/27/2021  ? Pneumonia   ? Pure hypercholesterolemia 05/27/2021  ? Renal disorder   ? 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 Sleep apnea   ? Stroke [facility])   ? ?Past Surgical History:  ?Procedure Laterality Date  ? ABDOMINAL HYSTERECTOMY    ? BIOPSY  09/08/2021  ? Procedure: BIOPSY;  Surgeon: Doran Stabler, MD;  [facility];  Service: Gastroenterology;;  ? CESAREAN SECTION    ? ESOPHAGOGASTRODUODENOSCOPY (EGD) WITH PROPOFOL N/A 09/08/2021  ? Procedure: ESOPHAGOGASTRODUODENOSCOPY (EGD) WITH PROPOFOL;  Surgeon: Doran Stabler, MD;  Location: [facility];  Service: Gastroenterology;  Laterality: N/A;  chest pain, epigastric pain  ? INNER EAR SURGERY Bilateral   ? RIGHT HEART CATH N/A 05/14/2021  ? Procedure: RIGHT HEART CATH;  Surgeon: Larey Dresser, MD;  Location: [place] [facility];  Service: Cardiovascular;  Laterality: N/A;  ? TONSILLECTOMY    ? age 45  ? ?Patient Active Problem List  ? Diagnosis Date Noted  ? Pure hypercholesterolemia 05/27/2021  ? Chronic diastolic CHF (congestive heart failure) ([place]) 04/29/2021  ? Hypertensive urgency 04/14/2021  ? Acute on chronic diastolic CHF (congestive heart failure) ([place]) 04/14/2021  ? Chest pain 03/05/2021  ? CAD (coronary artery disease) 03/05/2021  ? T2DM (type 2 diabetes mellitus) ([place]) 03/05/2021  ? CKD  (chronic kidney disease) stage 4, GFR 15-29 ml/min (HCC) 03/05/2021  ? Class 3 obesity ([place]) 03/05/2021  ? CVA (cerebral vascular accident) ([place]) 03/05/2021  ? Acute on chronic diastolic heart failure ([place]) 03/05/2021  ? 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 AKI (acute kidney injury) ([place]) 03/05/2021  ? Vitreous hemorrhage of left eye ([place]) 11/14/2020  ? Orthopnea 08/06/2020  ? Vision loss of right eye 08/06/2020  ? Excessive daytime sleepiness 01/11/2020  ? Iron deficiency anemia 12/14/2019  ? Vitreous hemorrhage, right ([place]) 12/04/2019  ? Diabetic neuropathy ([place]) 07/26/2019  ? Proliferative diabetic retinopathy of both 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 ([place]) 02/03/2019  ? 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 Conductive hearing loss, bilateral 03/03/2018  ? Chronic bilateral low back pain without sciatica 08/21/2015  ? Chronic kidney disease (CKD) stage G3b/A3, moderately decreased glomerular filtration rate (GFR) between 30-44 mL/min/1.73 square meter and albuminuria creatinine ratio greater than 300 mg/g (HCC) 04/30/2015  ? Type 2 diabetes mellitus with diabetic polyneuropathy, with long-term current use of insulin ([place]) 02/15/2015  ? Advance directive on file 05/17/2014  ? Refusal of blood transfusions as patient is Jehovah's Witness 05/17/2014  ? OSA treated with BiPAP 02/23/2014  ? Coronary artery disease 07/07/2013  ? Severe obesity (BMI >= 40) ([place]) 05/18/2013  ? Neuropathy 04/11/2013  ? Diastolic heart failure ([place]) 10/16/2012  ? Essential hypertension 10/16/2012  ? ? ?PCP: Nolene Ebbs, MD ? ?REFERRING PROVIDER: Gentry Fitz, MD ? ?REFERRING DIAG: Low back  pain and SI dysfunction ? ?THERAPY DIAG:  ?No diagnosis found. ? ?ONSET DATE: Chronic ? ?SUBJECTIVE:                                                                                                                                                                                          ? ?SUBJECTIVE STATEMENT: ?Pt  reports her back has has been hurting for many years, but worse the past couple. Aggravated c sitting (20 mins) standing (30 mins) and walking (7 mins). Denies pain in legs and numbness except for of feet related to diabetes. Pt notes she sleeps in a recliner. ? ?PERTINENT HISTORY:  ?Significant hx of heart issues, reports balance issues related to BP dropping with standing, Obesity, OA knees and hips ? ?PAIN:  ?Are you having pain? Yes: NPRS scale: 6/10 ?Pain location: low back ?Pain description: ache, throb ?Aggravating factors: Prolonged sitting, standing, and walking ?Relieving factors: Rest, pain meds as needed ? 6-9/10 pain range ? ?PRECAUTIONS: None ? ?WEIGHT BEARING RESTRICTIONS No ? ?FALLS:  ?Has patient fallen in last 6 months? No ? ?LIVING ENVIRONMENT: ?Lives with: lives with their family ?Lives in: House/apartment ?Pt is able to access home and be mobile within home ? ?OCCUPATION: Disabled ? ?PLOF: Independent ? ?PATIENT GOALS To walk long, be more active, to have less pain ? ? ?OBJECTIVE:  ? ?DIAGNOSTIC FINDINGS:  ?Not available in EPIC ? ?PATIENT SURVEYS:  ?Modified Oswestry 40%  ? ?SCREENING FOR RED FLAGS: ?Bowel or bladder incontinence: No ?Cauda equina syndrome: No ? ?COGNITION: ? Overall cognitive status: Within functional limits for tasks assessed   ?  ?SENSATION: ?WFL ? ?MUSCLE LENGTH: ?Hamstrings: Right WNLs deg; Left WNLs deg ?Thomas test: Right WNLs deg; Left WNLs deg ? ?POSTURE:  ?Forward head, rounded shoulder, decreased kyphosis, increased lordosis ? ?PALPATION: ?TTP midline lower vertebrae, bilat upper gluteal ? ?LUMBAR ROM:  ? ?Active  A/PROM  ?10/21/2021  ?Flexion Full No pain  ?Extension Full pressure LB pain mod  ?Right lateral flexion Full  R LB pain, mod  ?Left lateral flexion Full L LB pain, min  ?Right rotation Full R LB pain, mod  ?Left rotation Full LB pain, min  ? (Blank rows = not tested) ? ?LE ROM: ?  Grossly WNLs ?Active  Right ?10/21/2021 Left ?10/21/2021  ?Hip flexion    ?Hip  extension    ?Hip abduction    ?Hip adduction    ?Hip internal rotation    ?Hip external rotation    ?Knee flexion    ?Knee extension    ?Ankle dorsiflexion    ?Ankle plantarflexion    ?Ankle inversion    ?  Ankle eversion    ? (Blank rows = not tested) ? ?LE MMT: ?  Myotomes for legs are equal with no overt weakness, but with decreased strength of the hips. Weak core. ?MMT Right ?10/21/2021 Left ?10/21/2021  ?Hip flexion 4 4  ?Hip extension 4 4  ?Hip abduction 4 4  ?Hip adduction 4 4  ?Hip internal rotation    ?Hip external rotation    ?Knee flexion    ?Knee extension    ?Ankle dorsiflexion    ?Ankle plantarflexion    ?Ankle inversion    ?Ankle eversion    ? (Blank rows = not tested) ? ?LUMBAR SPECIAL TESTS:  ?Straight leg raise test: Negative and Slump test: Negative ? ?FUNCTIONAL TESTS:  ?5 times sit to stand: TBA ?2 minute walk test: TBA ? ?GAIT: ?Distance walked: 2103ft ?Assistive device utilized: None ?Level of assistance: Complete Independence ?Comments: Decreased pace, decreased trunk rotation, valgus bilat legs ? ?TODAY'S TREATMENT  ?- Supine Lower Trunk Rotation  10 reps 5 hold ?- Supine Posterior Pelvic Tilt  10 reps 3 hold ?- Hooklying Single Knee to Chest Stretch 3 reps 20 hold ?- Seated Flexion Stretch with Swiss Ball  6 reps - 20 hold ? ?PATIENT EDUCATION:  ?Education details: eval findings, POC, HEP ?Person educated: Patient ?Education method: Explanation, Demonstration, Tactile cues, Verbal cues, and Handouts ?Education comprehension: verbalized understanding, returned demonstration, verbal cues required, and tactile cues required ? ? ?HOME EXERCISE PROGRAM: ?Access Code: [email or web address] ?URL: [email or web address] ?Date: 10/23/2021 ?Prepared by: Gar Ponto ? ?Exercises ?- Supine Lower Trunk Rotation  - 2 x daily - 7 x weekly - 1 sets - 10 reps - 5 hold ?- Supine Posterior Pelvic Tilt  - 2 x daily - 7 x weekly - 1 sets - 10 reps - 3 hold ?- Hooklying Single Knee to Chest Stretch  - 2 x daily - 7 x  weekly - 3 sets - 10 reps - 20 hold ?- Seated Flexion Stretch with Swiss Ball  - 2 x daily - 7 x weekly - 3 sets - 10 reps - 20 hold ? ?ASSESSMENT: ? ?CLINICAL IMPRESSION: ?Patient is a 61 y.o. F who was seen

## 2021-10-22 ENCOUNTER — Ambulatory Visit: Payer: Medicaid Other | Attending: Family Medicine

## 2021-10-22 DIAGNOSIS — M62830 Muscle spasm of back: Secondary | ICD-10-CM | POA: Diagnosis not present

## 2021-10-22 DIAGNOSIS — M5459 Other low back pain: Secondary | ICD-10-CM

## 2021-10-22 DIAGNOSIS — R262 Difficulty in walking, not elsewhere classified: Secondary | ICD-10-CM | POA: Diagnosis not present

## 2021-10-27 ENCOUNTER — Encounter (HOSPITAL_COMMUNITY): Payer: Self-pay

## 2021-10-27 ENCOUNTER — Telehealth (HOSPITAL_COMMUNITY): Payer: Self-pay | Admitting: Clinical

## 2021-10-27 ENCOUNTER — Ambulatory Visit (HOSPITAL_COMMUNITY): Payer: Medicaid Other | Admitting: Clinical

## 2021-10-27 NOTE — Telephone Encounter (Signed)
Therapist called the client for the scheduled therapy appointment. Therapist was unable to reach the client. Therapist left a voice mail with the office number for her to call back to reschedule. ?

## 2021-10-29 ENCOUNTER — Encounter: Payer: Self-pay | Admitting: Physical Therapy

## 2021-10-29 ENCOUNTER — Encounter (HOSPITAL_BASED_OUTPATIENT_CLINIC_OR_DEPARTMENT_OTHER): Payer: Self-pay

## 2021-10-29 ENCOUNTER — Ambulatory Visit: Payer: Medicaid Other | Admitting: Physical Therapy

## 2021-10-29 DIAGNOSIS — M62830 Muscle spasm of back: Secondary | ICD-10-CM | POA: Diagnosis not present

## 2021-10-29 DIAGNOSIS — M5459 Other low back pain: Secondary | ICD-10-CM

## 2021-10-29 DIAGNOSIS — R262 Difficulty in walking, not elsewhere classified: Secondary | ICD-10-CM | POA: Diagnosis not present

## 2021-10-29 NOTE — Therapy (Signed)
?OUTPATIENT PHYSICAL THERAPY TREATMENT NOTE ? ? ?Patient Name: Maria Hudson ?MRN: [id number] ?DOB:04/10/1961, 61 y.o., female ?Today's Date: 10/29/2021 ?PCP: Nolene Ebbs, MD ?  ?REFERRING PROVIDER: Gentry Fitz, MD ? ?END OF SESSION:  ? PT End of Session - 10/29/21 0805   ? ? Visit Number 2   ? Number of Visits 13   ? Date for PT Re-Evaluation 12/12/21   ? Authorization Type [place] MEDICAID [employer]   ? Authorization Time Period 10/23/21-12/22/21   ? Authorization - Visit Number 1   ? Authorization - Number of Visits 10   ? PT Start Time [phone]   ? PT Stop Time 0841   ? PT Time Calculation (min) 38 min   ? ?  ?  ? ?  ? ? ?Past Medical History:  ?Diagnosis Date  ? Acute on chronic diastolic heart failure ([place]) 03/05/2021  ? Anemia   ? Arthritis   ? CAD (coronary artery disease)   ? CHF (congestive heart failure) ([place])   ? CKD (chronic kidney disease) stage 4, GFR 15-29 ml/min (HCC) 03/05/2021  ? Colon polyps   ? CVA (cerebral vascular accident) [facility])   ? Depression   ? Diabetes mellitus without complication ([place])   ? Hypertension   ? Hypertensive urgency 04/14/2021  ? OSA (obstructive sleep apnea) 05/27/2021  ? Pneumonia   ? Pure hypercholesterolemia 05/27/2021  ? Renal disorder   ? 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 Sleep apnea   ? Stroke [facility])   ? ?Past Surgical History:  ?Procedure Laterality Date  ? ABDOMINAL HYSTERECTOMY    ? BIOPSY  09/08/2021  ? Procedure: BIOPSY;  Surgeon: Doran Stabler, MD;  [facility];  Service: Gastroenterology;;  ? CESAREAN SECTION    ? ESOPHAGOGASTRODUODENOSCOPY (EGD) WITH PROPOFOL N/A 09/08/2021  ? Procedure: ESOPHAGOGASTRODUODENOSCOPY (EGD) WITH PROPOFOL;  Surgeon: Doran Stabler, MD;  [facility];  Service: Gastroenterology;  Laterality: N/A;  chest pain, epigastric pain  ? INNER EAR SURGERY Bilateral   ? RIGHT HEART CATH N/A 05/14/2021  ? Procedure: RIGHT HEART CATH;  Surgeon: Larey Dresser, MD;  Location: [facility] [facility];  Service:  Cardiovascular;  Laterality: N/A;  ? TONSILLECTOMY    ? age 69  ? ?Patient Active Problem List  ? Diagnosis Date Noted  ? Pure hypercholesterolemia 05/27/2021  ? Chronic diastolic CHF (congestive heart failure) ([place]) 04/29/2021  ? Hypertensive urgency 04/14/2021  ? Acute on chronic diastolic CHF (congestive heart failure) ([place]) 04/14/2021  ? Chest pain 03/05/2021  ? CAD (coronary artery disease) 03/05/2021  ? T2DM (type 2 diabetes mellitus) ([place]) 03/05/2021  ? CKD (chronic kidney disease) stage 4, GFR 15-29 ml/min (HCC) 03/05/2021  ? Class 3 obesity ([place]) 03/05/2021  ? CVA (cerebral vascular accident) ([place]) 03/05/2021  ? Acute on chronic diastolic heart failure ([place]) 03/05/2021  ? 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 AKI (acute kidney injury) ([place]) 03/05/2021  ? Vitreous hemorrhage of left eye ([place]) 11/14/2020  ? Orthopnea 08/06/2020  ? Vision loss of right eye 08/06/2020  ? Excessive daytime sleepiness 01/11/2020  ? Iron deficiency anemia 12/14/2019  ? Vitreous hemorrhage, right ([place]) 12/04/2019  ? Diabetic neuropathy ([place]) 07/26/2019  ? 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 Conductive hearing loss, bilateral 03/03/2018  ? Chronic bilateral low back pain without sciatica 08/21/2015  ? Chronic kidney disease (CKD) stage G3b/A3, moderately decreased glomerular  filtration rate (GFR) between 30-44 mL/min/1.73 square meter and albuminuria creatinine ratio greater than 300 mg/g ([place]) 04/30/2015  ? Type 2 diabetes mellitus with diabetic polyneuropathy, with long-term current use of insulin ([place]) 02/15/2015  ? Advance directive on file 05/17/2014  ? Refusal of blood transfusions as patient is Jehovah's Witness 05/17/2014  ? OSA treated with BiPAP 02/23/2014  ? Coronary artery disease 07/07/2013  ? Severe obesity (BMI >= 40) ([place]) 05/18/2013  ? Neuropathy 04/11/2013  ? Diastolic heart failure ([place])  10/16/2012  ? Essential hypertension 10/16/2012  ? ?REFERRING DIAG: Low back pain and SI dysfunction  ? ?THERAPY DIAG:  ?Other low back pain ? ?Muscle spasm of back ? ?Difficulty in walking, not elsewhere classified ? ?PERTINENT HISTORY: Significant hx of heart issues, reports balance issues related to BP dropping with standing, Obesity, OA knees and hips  ? ?PRECAUTIONS: None ? ?SUBJECTIVE: I like the stretches. I think they help. My back hurts.  ? ?PAIN:  ?Are you having pain? Yes: NPRS scale: 5/10 ?Pain location: low back ?Pain description: ache, throb ?Aggravating factors: Prolonged sitting, standing, and walking ?Relieving factors: Rest, pain meds as needed ?          6-9/10 pain range ? ? ?OBJECTIVE: (objective measures completed at initial evaluation unless otherwise dated) ? ? ?DIAGNOSTIC FINDINGS:  ?Not available in EPIC ?  ?PATIENT SURVEYS:  ?Modified Oswestry 40%  ?  ?SCREENING FOR RED FLAGS: ?Bowel or bladder incontinence: No ?Cauda equina syndrome: No ?  ?COGNITION: ?          Overall cognitive status: Within functional limits for tasks assessed               ?           ?SENSATION: ?WFL ?  ?MUSCLE LENGTH: ?Hamstrings: Right WNLs deg; Left WNLs deg ?Thomas test: Right WNLs deg; Left WNLs deg ?  ?POSTURE:  ?Forward head, rounded shoulder, decreased kyphosis, increased lordosis ?  ?PALPATION: ?TTP midline lower vertebrae, bilat upper gluteal ?  ?LUMBAR ROM:  ?  ?Active  A/PROM  ?10/21/2021  ?Flexion Full No pain  ?Extension Full pressure LB pain mod  ?Right lateral flexion Full  R LB pain, mod  ?Left lateral flexion Full L LB pain, min  ?Right rotation Full R LB pain, mod  ?Left rotation Full LB pain, min  ? (Blank rows = not tested) ?  ?LE ROM: ?                      Grossly WNLs ?Active  Right ?10/21/2021 Left ?10/21/2021  ?Hip flexion      ?Hip extension      ?Hip abduction      ?Hip adduction      ?Hip internal rotation      ?Hip external rotation      ?Knee flexion      ?Knee extension      ?Ankle  dorsiflexion      ?Ankle plantarflexion      ?Ankle inversion      ?Ankle eversion      ? (Blank rows = not tested) ?  ?LE MMT: ?                      Myotomes for legs are equal with no overt weakness, but with decreased strength of the hips. Weak core. ?MMT Right ?10/21/2021 Left ?10/21/2021  ?Hip flexion 4 4  ?Hip extension 4 4  ?Hip abduction  4 4  ?Hip adduction 4 4  ?Hip internal rotation      ?Hip external rotation      ?Knee flexion      ?Knee extension      ?Ankle dorsiflexion      ?Ankle plantarflexion      ?Ankle inversion      ?Ankle eversion      ? (Blank rows = not tested) ?  ?LUMBAR SPECIAL TESTS:  ?Straight leg raise test: Negative and Slump test: Negative ?  ?FUNCTIONAL TESTS:  ?5 times sit to stand: 10/29/21 19.1 sec (hands on thighs) ?2 minute walk test: 10/29/21 200 feet (one standing rest break. Frequent staggering due to dizziness).  ?  ?GAIT: ?Distance walked: 215ft ?Assistive device utilized: None ?Level of assistance: Complete Independence ?Comments: Decreased pace, decreased trunk rotation, valgus bilat legs ?  ?TODAY'S TREATMENT  ?[facility] Adult PT Treatment:                                                DATE: 10/29/21 ?Therapeutic Exercise: ?2 MWT ?5 x STS ?STS x 10 hands ?PPT -mod cues x 15 for sec  ?LTR x 10 SKTC  ?Seated childs pose with laterals 3 reps 20 sec each  ?Nustep L 5 x 5 minutes UE/LE ? ? ?INITIAL TREATMENT:  ?- Supine Lower Trunk Rotation  10 reps 5 hold ?- Supine Posterior Pelvic Tilt  10 reps 3 hold ?- Hooklying Single Knee to Chest Stretch 3 reps 20 hold ?- Seated Flexion Stretch with Swiss Ball  6 reps - 20 hold ?  ?PATIENT EDUCATION:  ?Education details: eval findings, POC, HEP ?Person educated: Patient ?Education method: Explanation, Demonstration, Tactile cues, Verbal cues, and Handouts ?Education comprehension: verbalized understanding, returned demonstration, verbal cues required, and tactile cues required ?  ?  ?HOME EXERCISE PROGRAM: ?Access Code: [email or web address] ?URL:  [email or web address] ?Date: 10/29/2021 ?Prepared by: Hessie Diener ? ?Exercises ?- Supine Lower Trunk Rotation  - 2 x daily - 7 x weekly - 1 sets - 10 reps - 5 hold ?- Supine Posterior Pelvic Tilt  - 2 x daily - 7

## 2021-10-30 DIAGNOSIS — I129 Hypertensive chronic kidney disease with stage 1 through stage 4 chronic kidney disease, or unspecified chronic kidney disease: Secondary | ICD-10-CM | POA: Diagnosis not present

## 2021-10-30 DIAGNOSIS — E1122 Type 2 diabetes mellitus with diabetic chronic kidney disease: Secondary | ICD-10-CM | POA: Diagnosis not present

## 2021-10-30 DIAGNOSIS — I5032 Chronic diastolic (congestive) heart failure: Secondary | ICD-10-CM | POA: Diagnosis not present

## 2021-10-30 DIAGNOSIS — N184 Chronic kidney disease, stage 4 (severe): Secondary | ICD-10-CM | POA: Diagnosis not present

## 2021-10-30 DIAGNOSIS — N179 Acute kidney failure, unspecified: Secondary | ICD-10-CM | POA: Diagnosis not present

## 2021-11-03 ENCOUNTER — Encounter: Payer: Medicaid Other | Admitting: Physical Therapy

## 2021-11-03 ENCOUNTER — Other Ambulatory Visit: Payer: Self-pay | Admitting: *Deleted

## 2021-11-03 DIAGNOSIS — N1832 Chronic kidney disease, stage 3b: Secondary | ICD-10-CM

## 2021-11-03 DIAGNOSIS — M5451 Vertebrogenic low back pain: Secondary | ICD-10-CM | POA: Diagnosis not present

## 2021-11-05 ENCOUNTER — Encounter: Payer: Self-pay | Admitting: Physical Therapy

## 2021-11-05 ENCOUNTER — Ambulatory Visit: Payer: Medicaid Other | Admitting: Physical Therapy

## 2021-11-05 VITALS — BP 100/60

## 2021-11-05 DIAGNOSIS — I10 Essential (primary) hypertension: Secondary | ICD-10-CM | POA: Diagnosis not present

## 2021-11-05 DIAGNOSIS — M62830 Muscle spasm of back: Secondary | ICD-10-CM

## 2021-11-05 DIAGNOSIS — R262 Difficulty in walking, not elsewhere classified: Secondary | ICD-10-CM | POA: Diagnosis not present

## 2021-11-05 DIAGNOSIS — M5459 Other low back pain: Secondary | ICD-10-CM

## 2021-11-05 DIAGNOSIS — G4730 Sleep apnea, unspecified: Secondary | ICD-10-CM | POA: Diagnosis not present

## 2021-11-05 DIAGNOSIS — L259 Unspecified contact dermatitis, unspecified cause: Secondary | ICD-10-CM | POA: Diagnosis not present

## 2021-11-05 DIAGNOSIS — I509 Heart failure, unspecified: Secondary | ICD-10-CM | POA: Diagnosis not present

## 2021-11-05 DIAGNOSIS — E1122 Type 2 diabetes mellitus with diabetic chronic kidney disease: Secondary | ICD-10-CM | POA: Diagnosis not present

## 2021-11-05 NOTE — Therapy (Signed)
OUTPATIENT PHYSICAL THERAPY TREATMENT NOTE   Patient Name: Maria Hudson MRN: [id number] DOB:05/19/1961, 61 y.o., female Today's Date: 11/05/2021 PCP: Nolene Ebbs, MD   REFERRING PROVIDER: Gentry Fitz, MD  END OF SESSION:   PT End of Session - 11/05/21 0805     Visit Number 3    Number of Visits 13    Date for PT Re-Evaluation 12/12/21    Authorization Type [place] MEDICAID [facility]    Authorization Time Period 10/23/21-12/22/21    Authorization - Visit Number 2    Authorization - Number of Visits 10    PT Start Time 0758    PT Stop Time 0842    PT Time Calculation (min) 44 min             Past Medical History:  Diagnosis Date   Acute on chronic diastolic heart failure ([place]) 03/05/2021   Anemia    Arthritis    CAD (coronary artery disease)    CHF (congestive heart failure) (HCC)    CKD (chronic kidney disease) stage 4, GFR 15-29 ml/min (HCC) 03/05/2021   Colon polyps    CVA (cerebral vascular accident) ([place])    Depression    Diabetes mellitus without complication ([place])    Hypertension    Hypertensive urgency 04/14/2021   OSA (obstructive sleep apnea) 05/27/2021   Pneumonia    Pure hypercholesterolemia 05/27/2021   Renal disorder    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   Sleep apnea    Stroke [facility])    Past Surgical History:  Procedure Laterality Date   ABDOMINAL HYSTERECTOMY     BIOPSY  09/08/2021   Procedure: BIOPSY;  Surgeon: Doran Stabler, MD;  Location: [facility];  Service: Gastroenterology;;   CESAREAN SECTION     ESOPHAGOGASTRODUODENOSCOPY (EGD) WITH PROPOFOL N/A 09/08/2021   Procedure: ESOPHAGOGASTRODUODENOSCOPY (EGD) WITH PROPOFOL;  Surgeon: Doran Stabler, MD;  Location: [facility];  Service: Gastroenterology;  Laterality: N/A;  chest pain, epigastric pain   INNER EAR SURGERY Bilateral    RIGHT HEART CATH N/A 05/14/2021   Procedure: RIGHT HEART CATH;  Surgeon: Larey Dresser, MD;  Location: [facility];  Service:  Cardiovascular;  Laterality: N/A;   TONSILLECTOMY     age 5   Patient Active Problem List   Diagnosis Date Noted   Pure hypercholesterolemia 05/27/2021   Chronic diastolic CHF (congestive heart failure) ([place]) 04/29/2021   Hypertensive urgency 04/14/2021   Acute on chronic diastolic CHF (congestive heart failure) ([place]) 04/14/2021   Chest pain 03/05/2021   CAD (coronary artery disease) 03/05/2021   T2DM (type 2 diabetes mellitus) ([place]) 03/05/2021   CKD (chronic kidney disease) stage 4, GFR 15-29 ml/min (HCC) 03/05/2021   Class 3 obesity ([place]) 03/05/2021   CVA (cerebral vascular accident) ([place]) 03/05/2021   Acute on chronic diastolic heart failure ([place]) 03/05/2021   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   AKI (acute kidney injury) ([place]) 03/05/2021   Vitreous hemorrhage of left eye ([place]) 11/14/2020   Orthopnea 08/06/2020   Vision loss of right eye 08/06/2020   Excessive daytime sleepiness 01/11/2020   Iron deficiency anemia 12/14/2019   Vitreous hemorrhage, right ([place]) 12/04/2019   Diabetic neuropathy ([place]) 07/26/2019   Proliferative diabetic retinopathy of both 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 ([place]) 02/03/2019   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   Conductive hearing loss, bilateral 03/03/2018   Chronic bilateral low back pain without sciatica 08/21/2015   Chronic kidney disease (CKD) stage G3b/A3, moderately decreased glomerular  filtration rate (GFR) between 30-44 mL/min/1.73 square meter and albuminuria creatinine ratio greater than 300 mg/g (HCC) 04/30/2015   Type 2 diabetes mellitus with diabetic polyneuropathy, with long-term current use of insulin ([place]) 02/15/2015   Advance directive on file 05/17/2014   Refusal of blood transfusions as patient is Jehovah's Witness 05/17/2014   OSA treated with BiPAP 02/23/2014   Coronary artery disease 07/07/2013   Severe obesity (BMI >= 40) ([place]) 05/18/2013   Neuropathy 36/14/4315   Diastolic heart failure ([place])  10/16/2012   Essential hypertension 10/16/2012   REFERRING DIAG: Low back pain and SI dysfunction   THERAPY DIAG:  Other low back pain  Muscle spasm of back  Difficulty in walking, not elsewhere classified  PERTINENT HISTORY: Significant hx of heart issues, reports balance issues related to BP dropping with standing, Obesity, OA knees and hips   PRECAUTIONS: None  SUBJECTIVE: My knee is a lot better. MD released me but wants me to continued PT for my back.   PAIN:  Are you having pain? Yes: NPRS scale: 5/10 Pain location: low back Pain description: ache, throb Aggravating factors: Prolonged sitting, standing, and walking Relieving factors: Rest, pain meds as needed           6-9/10 pain range   OBJECTIVE: (objective measures completed at initial evaluation unless otherwise dated)   DIAGNOSTIC FINDINGS:  Not available in EPIC   PATIENT SURVEYS:  Modified Oswestry 40%    SCREENING FOR RED FLAGS: Bowel or bladder incontinence: No Cauda equina syndrome: No   COGNITION:           Overall cognitive status: Within functional limits for tasks assessed                          SENSATION: WFL   MUSCLE LENGTH: Hamstrings: Right WNLs deg; Left WNLs deg Marcello Moores test: Right WNLs deg; Left WNLs deg   POSTURE:  Forward head, rounded shoulder, decreased kyphosis, increased lordosis   PALPATION: TTP midline lower vertebrae, bilat upper gluteal   LUMBAR ROM:    Active  A/PROM  10/21/2021  Flexion Full No pain  Extension Full pressure LB pain mod  Right lateral flexion Full  R LB pain, mod  Left lateral flexion Full L LB pain, min  Right rotation Full R LB pain, mod  Left rotation Full LB pain, min   (Blank rows = not tested)   LE ROM:                       Grossly WNLs Active  Right 10/21/2021 Left 10/21/2021  Hip flexion      Hip extension      Hip abduction      Hip adduction      Hip internal rotation      Hip external rotation      Knee flexion      Knee  extension      Ankle dorsiflexion      Ankle plantarflexion      Ankle inversion      Ankle eversion       (Blank rows = not tested)   LE MMT:                       Myotomes for legs are equal with no overt weakness, but with decreased strength of the hips. Weak core. MMT Right 10/21/2021 Left 10/21/2021  Hip flexion 4 4  Hip extension 4 4  Hip abduction 4 4  Hip adduction 4 4  Hip internal rotation      Hip external rotation      Knee flexion      Knee extension      Ankle dorsiflexion      Ankle plantarflexion      Ankle inversion      Ankle eversion       (Blank rows = not tested)   LUMBAR SPECIAL TESTS:  Straight leg raise test: Negative and Slump test: Negative   FUNCTIONAL TESTS:  5 times sit to stand: 10/29/21 19.1 sec (hands on thighs) 2 minute walk test: 10/29/21 200 feet (one standing rest break. Frequent staggering due to dizziness).    GAIT: Distance walked: 278ft Assistive device utilized: None Level of assistance: Complete Independence Comments: Decreased pace, decreased trunk rotation, valgus bilat legs   TODAY'S TREATMENT  [facility] Adult PT Treatment:                                                DATE: 11/05/21 Therapeutic Exercise:  PPT -mod cues x 10 for 5 sec  LTR x 10  Figure 4- pull x 2 each  Supine clam green band 10 x 2  Ball squeeze 10 x 2 Bridge 5 x 2  SKTC 20 sec x 3 each   [facility] Adult PT Treatment:                                                DATE: 10/29/21 Therapeutic Exercise: 2 MWT 5 x STS STS x 10 hands PPT -mod cues x 15 for sec  LTR x 10 SKTC  Seated childs pose with laterals 3 reps 20 sec each  Nustep L 5 x 5 minutes UE/LE   INITIAL TREATMENT:  - Supine Lower Trunk Rotation  10 reps 5 hold - Supine Posterior Pelvic Tilt  10 reps 3 hold - Hooklying Single Knee to Chest Stretch 3 reps 20 hold - Seated Flexion Stretch with Swiss Ball  6 reps - 20 hold   PATIENT EDUCATION:  Education details: eval findings, POC, HEP Person  educated: Patient Education method: Explanation, Demonstration, Tactile cues, Verbal cues, and Handouts Education comprehension: verbalized understanding, returned demonstration, verbal cues required, and tactile cues required     HOME EXERCISE PROGRAM: Access Code: [email or web address] URL: [email or web address] Date: 11/05/2021 Prepared by: Hessie Diener  Exercises - Supine Lower Trunk Rotation  - 2 x daily - 7 x weekly - 1 sets - 10 reps - 5 hold - Supine Posterior Pelvic Tilt  - 2 x daily - 7 x weekly - 2 sets - 10 reps - 5 hold - Hooklying Single Knee to Chest Stretch  - 2 x daily - 7 x weekly - 1 sets - 3 reps - 20 hold - Seated Flexion Stretch with Swiss Ball  - 2 x daily - 7 x weekly - 2 sets - 3 reps - 20 hold - Hooklying Clamshell with Resistance  - 2 x daily - 7 x weekly - 1-2 sets - 10 reps - 5 hold - Supine Hip Adduction Isometric with Ball  - 1 x daily - 7 x weekly - 1-2 sets - 10  reps - Supine Bridge  - 1 x daily - 7 x weekly - 2 sets - 5 reps   ASSESSMENT:   CLINICAL IMPRESSION: Patient is a 61 y.o. F who was seen today for physical therapy treatment for low back pain and SI joint dysfunction. She saw MD for F/U and he has released her to continue PT for her lower back. She brought her progress notes form 10/06/21 which discussed potential SI joint dysfunction and mild degerative changes in L-spine. She reports min compliance with HEP. She reports BP fluctuations that continue to cause her dizziness. Today BP 100/60 at start of session and she is wearing abdominal binder to prevent BP drop when she stands up. She reached out to her cardiac doctor who gives her no restrictions for physical therapy. Progressed HEP with lumbar stability exercises. She reported feeling fine at end of session.    Reviewed HEP and reprinted as she has misplaced her initial copy. She required mod cues for correct technique with PPT. Pt reports her BP runs high in the mornings so discussed moving  her appointments to mid day in the future. Also discussed contacting cardiac MD to ask about any restrictions.    OBJECTIVE IMPAIRMENTS decreased balance, difficulty walking, decreased strength, dizziness, increased muscle spasms, obesity, and pain.    ACTIVITY LIMITATIONS cleaning, community activity, driving, meal prep, occupation, laundry, and shopping.    PERSONAL FACTORS Fitness, Past/current experiences, Time since onset of injury/illness/exacerbation, and 3+ comorbidities:    Significant hx of heart issues, reports balance issues related to BP dropping with standing, Obesity, OA knees and hipsare also affecting patient's functional outcome.      REHAB POTENTIAL: Good   CLINICAL DECISION MAKING: Stable/uncomplicated   EVALUATION COMPLEXITY: Low     GOALS:   SHORT TERM GOALS: Target date: 11/13/2021     Pt will be Ind in an initial HEP Baseline:started on eval Goal status: INITIAL   2.  Pt will voice understanding of measures to assist with the decrease and management of back pain Baseline:  Goal status: INITIAL     LONG TERM GOALS: Target date: 12/12/21   Increase pt's core strength and hip strength to 4+/5  for improved function Baseline: 4/5 Goal status: INITIAL   2.  Pt will report a decrease in back pain to 6/10 or less intermittently with daily activities Baseline: 6-10/10 Goal status: INITIAL   3.  Pt will be able to tolerate walking for 20 mins before needing to sitt down  Baseline: 7 min Goal status: INITIAL   4.  Pt will improve her 5xSTS time by the MCID of 4 sec as indication of improved function Baseline: TBA Goal status: INITIAL   5.  Pt will improve her 2MWT distance by the MCID of 57ft as indication of improved function Baseline: TBA Goal status: INITIAL   6.  Pt will be Ind in a final HEP to maintain achieved LOF Baseline: started on eval Goal status: INITIAL   7.  Pt's Mod Oswestry score will improve to 28% as indication of improved pain and  function Baseline: started on eval Goal status: INITIAL     PLAN: PT FREQUENCY: 2x/week   PT DURATION: 6 weeks   PLANNED INTERVENTIONS: Therapeutic exercises, Therapeutic activity, Balance training, Gait training, Patient/Family education, Joint mobilization, Stair training, Aquatic Therapy, Dry Needling, Electrical stimulation, Spinal manipulation, Spinal mobilization, Cryotherapy, Moist heat, Taping, Traction, Ultrasound, Ionotophoresis 4mg /ml Dexamethasone, and Manual therapy.   PLAN FOR NEXT SESSION: assess response to  HEP, progress as able ,check BP at start of session.  pt has dizziness in standing (BP drops)  - has abdominal binder she plans to wear to assist in BP . (No restrictions per cardiologists) . Add piriformis stretch       Hessie Diener, PTA 11/05/21 8:43 AM Phone: [phone] Fax: [phone]

## 2021-11-07 ENCOUNTER — Telehealth (HOSPITAL_BASED_OUTPATIENT_CLINIC_OR_DEPARTMENT_OTHER): Payer: Self-pay | Admitting: Cardiovascular Disease

## 2021-11-07 ENCOUNTER — Ambulatory Visit: Payer: Medicaid Other

## 2021-11-07 DIAGNOSIS — E1122 Type 2 diabetes mellitus with diabetic chronic kidney disease: Secondary | ICD-10-CM

## 2021-11-07 DIAGNOSIS — M62830 Muscle spasm of back: Secondary | ICD-10-CM | POA: Diagnosis not present

## 2021-11-07 DIAGNOSIS — E113513 Type 2 diabetes mellitus with proliferative diabetic retinopathy with macular edema, bilateral: Secondary | ICD-10-CM

## 2021-11-07 DIAGNOSIS — M5459 Other low back pain: Secondary | ICD-10-CM | POA: Diagnosis not present

## 2021-11-07 DIAGNOSIS — E1142 Type 2 diabetes mellitus with diabetic polyneuropathy: Secondary | ICD-10-CM

## 2021-11-07 DIAGNOSIS — R262 Difficulty in walking, not elsewhere classified: Secondary | ICD-10-CM

## 2021-11-07 NOTE — Therapy (Signed)
OUTPATIENT PHYSICAL THERAPY TREATMENT NOTE   Patient Name: Maria Hudson MRN: [id number] DOB:1960-12-31, 61 y.o., female Today's Date: 11/07/2021 PCP: Nolene Ebbs, MD   REFERRING PROVIDER: Gentry Fitz, MD  END OF SESSION:   PT End of Session - 11/07/21 0818     Visit Number 4    Number of Visits 13    Date for PT Re-Evaluation 12/12/21    Authorization Type [place] MEDICAID [facility]    Authorization Time Period 10/23/21-12/22/21    Authorization - Visit Number 3    Authorization - Number of Visits 10    PT Start Time 0811    PT Stop Time 0849    PT Time Calculation (min) 38 min    Activity Tolerance Patient tolerated treatment well    Behavior During Therapy [facility] for tasks assessed/performed              Past Medical History:  Diagnosis Date   Acute on chronic diastolic heart failure ([place]) 03/05/2021   Anemia    Arthritis    CAD (coronary artery disease)    CHF (congestive heart failure) (HCC)    CKD (chronic kidney disease) stage 4, GFR 15-29 ml/min ([place]) 03/05/2021   Colon polyps    CVA (cerebral vascular accident) ([place])    Depression    Diabetes mellitus without complication ([place])    Hypertension    Hypertensive urgency 04/14/2021   OSA (obstructive sleep apnea) 05/27/2021   Pneumonia    Pure hypercholesterolemia 05/27/2021   Renal disorder    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   Sleep apnea    Stroke [facility])    Past Surgical History:  Procedure Laterality Date   ABDOMINAL HYSTERECTOMY     BIOPSY  09/08/2021   Procedure: BIOPSY;  Surgeon: Doran Stabler, MD;  Location: [facility];  Service: Gastroenterology;;   CESAREAN SECTION     ESOPHAGOGASTRODUODENOSCOPY (EGD) WITH PROPOFOL N/A 09/08/2021   Procedure: ESOPHAGOGASTRODUODENOSCOPY (EGD) WITH PROPOFOL;  Surgeon: Doran Stabler, MD;  Location: [facility];  Service: Gastroenterology;  Laterality: N/A;  chest pain, epigastric pain   INNER EAR SURGERY Bilateral    RIGHT HEART CATH N/A  05/14/2021   Procedure: RIGHT HEART CATH;  Surgeon: Larey Dresser, MD;  Location: [facility];  Service: Cardiovascular;  Laterality: N/A;   TONSILLECTOMY     age 37   Patient Active Problem List   Diagnosis Date Noted   Pure hypercholesterolemia 05/27/2021   Chronic diastolic CHF (congestive heart failure) ([place]) 04/29/2021   Hypertensive urgency 04/14/2021   Acute on chronic diastolic CHF (congestive heart failure) ([place]) 04/14/2021   Chest pain 03/05/2021   CAD (coronary artery disease) 03/05/2021   T2DM (type 2 diabetes mellitus) ([place]) 03/05/2021   CKD (chronic kidney disease) stage 4, GFR 15-29 ml/min (HCC) 03/05/2021   Class 3 obesity ([place]) 03/05/2021   CVA (cerebral vascular accident) ([place]) 03/05/2021   Acute on chronic diastolic heart failure ([place]) 03/05/2021   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   AKI (acute kidney injury) ([place]) 03/05/2021   Vitreous hemorrhage of left eye ([place]) 11/14/2020   Orthopnea 08/06/2020   Vision loss of right eye 08/06/2020   Excessive daytime sleepiness 01/11/2020   Iron deficiency anemia 12/14/2019   Vitreous hemorrhage, right ([place]) 12/04/2019   Diabetic neuropathy ([place]) 07/26/2019   Proliferative diabetic retinopathy of both 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 ([place]) 02/03/2019   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   Conductive hearing loss, bilateral 03/03/2018  Chronic bilateral low back pain without sciatica 08/21/2015   Chronic kidney disease (CKD) stage G3b/A3, moderately decreased glomerular filtration rate (GFR) between 30-44 mL/min/1.73 square meter and albuminuria creatinine ratio greater than 300 mg/g ([place]) 04/30/2015   Type 2 diabetes mellitus with diabetic polyneuropathy, with long-term current use of insulin ([place]) 02/15/2015   Advance directive on file 05/17/2014   Refusal of blood transfusions as patient is Jehovah's Witness 05/17/2014   OSA treated with BiPAP 02/23/2014   Coronary artery disease  07/07/2013   Severe obesity (BMI >= 40) ([place]) 05/18/2013   Neuropathy 03/47/4259   Diastolic heart failure ([place]) 10/16/2012   Essential hypertension 10/16/2012   REFERRING DIAG: Low back pain and SI dysfunction   THERAPY DIAG:  Other low back pain  Muscle spasm of back  Difficulty in walking, not elsewhere classified  PERTINENT HISTORY: Significant hx of heart issues, reports balance issues related to BP dropping with standing, Obesity, OA knees and hips   PRECAUTIONS: None  SUBJECTIVE: Pt reports she walked at the mall for ex yesterday. Pt has found PT has been helpful with improving her low back pain.   PAIN:  Are you having pain? Yes: NPRS scale: 6/10 Pain location: low back Pain description: ache, throb Aggravating factors: Prolonged sitting, standing, and walking Relieving factors: Rest, pain meds as needed           6-9/10 pain range   OBJECTIVE: (objective measures completed at initial evaluation unless otherwise dated)   DIAGNOSTIC FINDINGS:  Not available in EPIC   PATIENT SURVEYS:  Modified Oswestry 40%    SCREENING FOR RED FLAGS: Bowel or bladder incontinence: No Cauda equina syndrome: No   COGNITION:           Overall cognitive status: Within functional limits for tasks assessed                          SENSATION: WFL   MUSCLE LENGTH: Hamstrings: Right WNLs deg; Left WNLs deg Marcello Moores test: Right WNLs deg; Left WNLs deg   POSTURE:  Forward head, rounded shoulder, decreased kyphosis, increased lordosis   PALPATION: TTP midline lower vertebrae, bilat upper gluteal   LUMBAR ROM:    Active  A/PROM  10/21/2021  Flexion Full No pain  Extension Full pressure LB pain mod  Right lateral flexion Full  R LB pain, mod  Left lateral flexion Full L LB pain, min  Right rotation Full R LB pain, mod  Left rotation Full LB pain, min   (Blank rows = not tested)   LE ROM:                       Grossly WNLs Active  Right 10/21/2021 Left 10/21/2021  Hip  flexion      Hip extension      Hip abduction      Hip adduction      Hip internal rotation      Hip external rotation      Knee flexion      Knee extension      Ankle dorsiflexion      Ankle plantarflexion      Ankle inversion      Ankle eversion       (Blank rows = not tested)   LE MMT:                       Myotomes for legs are equal  with no overt weakness, but with decreased strength of the hips. Weak core. MMT Right 10/21/2021 Left 10/21/2021  Hip flexion 4 4  Hip extension 4 4  Hip abduction 4 4  Hip adduction 4 4  Hip internal rotation      Hip external rotation      Knee flexion      Knee extension      Ankle dorsiflexion      Ankle plantarflexion      Ankle inversion      Ankle eversion       (Blank rows = not tested)   LUMBAR SPECIAL TESTS:  Straight leg raise test: Negative and Slump test: Negative   FUNCTIONAL TESTS:  5 times sit to stand: 10/29/21 19.1 sec (hands on thighs) 2 minute walk test: 10/29/21 200 feet (one standing rest break. Frequent staggering due to dizziness).    GAIT: Distance walked: 268ft Assistive device utilized: None Level of assistance: Complete Independence Comments: Decreased pace, decreased trunk rotation, valgus bilat legs   TODAY'S TREATMENT  [facility] Adult PT Treatment:                                                DATE: 11/07/21 Therapeutic Exercise: Nustep 11min L4 UE/LE PPT hand slides on thighs to engage core x 10 for 5 sec Marching c core engagement 5 sets of 4 marches  LTR x5  5" Figure 4 pull x 2 each 20" Supine clam green band 10 x 2  Ball squeeze 10 x 2 5" Bridge 5 x 2 5" Seated flexion forward and laterally x2 each 20"  [facility] Adult PT Treatment:                                                DATE: 11/05/21 Therapeutic Exercise:  PPT -mod cues x 10 for 5 sec  LTR x 10  Figure 4- pull x 2 each  Supine clam green band 10 x 2  Ball squeeze 10 x 2 Bridge 5 x 2  SKTC 20 sec x 3 each   [facility] Adult PT Treatment:                                                 DATE: 10/29/21 Therapeutic Exercise: 2 MWT 5 x STS STS x 10 hands PPT -mod cues x 15 for sec  LTR x 10 SKTC  Seated childs pose with laterals 3 reps 20 sec each  Nustep L 5 x 5 minutes UE/LE   INITIAL TREATMENT:  - Supine Lower Trunk Rotation  10 reps 5 hold - Supine Posterior Pelvic Tilt  10 reps 3 hold - Hooklying Single Knee to Chest Stretch 3 reps 20 hold - Seated Flexion Stretch with Swiss Ball  6 reps - 20 hold   PATIENT EDUCATION:  Education details: eval findings, POC, HEP Person educated: Patient Education method: Explanation, Demonstration, Tactile cues, Verbal cues, and Handouts Education comprehension: verbalized understanding, returned demonstration, verbal cues required, and tactile cues required     HOME EXERCISE PROGRAM: Access Code: [email or web address] URL: [email or web address] Date: 11/05/2021 Prepared  by: Hessie Diener  Exercises - Supine Lower Trunk Rotation  - 2 x daily - 7 x weekly - 1 sets - 10 reps - 5 hold - Supine Posterior Pelvic Tilt  - 2 x daily - 7 x weekly - 2 sets - 10 reps - 5 hold - Hooklying Single Knee to Chest Stretch  - 2 x daily - 7 x weekly - 1 sets - 3 reps - 20 hold - Seated Flexion Stretch with Swiss Ball  - 2 x daily - 7 x weekly - 2 sets - 3 reps - 20 hold - Hooklying Clamshell with Resistance  - 2 x daily - 7 x weekly - 1-2 sets - 10 reps - 5 hold - Supine Hip Adduction Isometric with Ball  - 1 x daily - 7 x weekly - 1-2 sets - 10 reps - Supine Bridge  - 1 x daily - 7 x weekly - 2 sets - 5 reps   ASSESSMENT:   CLINICAL IMPRESSION: Pt is responding appropriately to PT. Core exs are difficult related to being deconditioning/weakness, but pt is able to engage her core for strengthening. Pt tolerated PT today without adverse effects. Pt will continue to benefit from skilled PT to address limitation to optimize function with less pain.   OBJECTIVE IMPAIRMENTS decreased balance,  difficulty walking, decreased strength, dizziness, increased muscle spasms, obesity, and pain.    ACTIVITY LIMITATIONS cleaning, community activity, driving, meal prep, occupation, laundry, and shopping.    PERSONAL FACTORS Fitness, Past/current experiences, Time since onset of injury/illness/exacerbation, and 3+ comorbidities:    Significant hx of heart issues, reports balance issues related to BP dropping with standing, Obesity, OA knees and hips are also affecting patient's functional outcome.    REHAB POTENTIAL: Good   CLINICAL DECISION MAKING: Stable/uncomplicated   EVALUATION COMPLEXITY: Low     GOALS:   SHORT TERM GOALS: Target date: 11/13/2021     Pt will be Ind in an initial HEP Baseline:started on eval Goal status: INITIAL   2.  Pt will voice understanding of measures to assist with the decrease and management of back pain Baseline:  Goal status: INITIAL     LONG TERM GOALS: Target date: 12/12/21   Increase pt's core strength and hip strength to 4+/5  for improved function Baseline: 4/5 Goal status: INITIAL   2.  Pt will report a decrease in back pain to 6/10 or less intermittently with daily activities Baseline: 6-10/10 Goal status: INITIAL   3.  Pt will be able to tolerate walking for 20 mins before needing to sitt down  Baseline: 7 min Goal status: INITIAL   4.  Pt will improve her 5xSTS time by the MCID of 4 sec as indication of improved function Baseline: TBA Goal status: INITIAL   5.  Pt will improve her 2MWT distance by the MCID of 15ft as indication of improved function Baseline: TBA Goal status: INITIAL   6.  Pt will be Ind in a final HEP to maintain achieved LOF Baseline: started on eval Goal status: INITIAL   7.  Pt's Mod Oswestry score will improve to 28% as indication of improved pain and function Baseline: started on eval Goal status: INITIAL     PLAN: PT FREQUENCY: 2x/week   PT DURATION: 6 weeks   PLANNED INTERVENTIONS: Therapeutic  exercises, Therapeutic activity, Balance training, Gait training, Patient/Family education, Joint mobilization, Stair training, Aquatic Therapy, Dry Needling, Electrical stimulation, Spinal manipulation, Spinal mobilization, Cryotherapy, Moist heat, Taping, Traction, Ultrasound, Ionotophoresis  4mg /ml Dexamethasone, and Manual therapy.   PLAN FOR NEXT SESSION: assess response to HEP, progress as able ,check BP at start of session.  pt has dizziness in standing (BP drops)  - has abdominal binder she plans to wear to assist in BP . (No restrictions per cardiologists) . Add piriformis stretch    Akiko Schexnider MS, PT 11/07/21 11:35 AM

## 2021-11-07 NOTE — Telephone Encounter (Signed)
*  STAT* If patient is at the pharmacy, call can be transferred to refill team.   1. Which medications need to be refilled? (please list name of each medication and dose if known)  new prescription for  Ozempic .5 mg  2. Which pharmacy/location (including street and city if local pharmacy) is medication to be sent to? [facility] [address], [place],  3. Do they need a 30 day or 90 day supply?

## 2021-11-11 ENCOUNTER — Encounter (HOSPITAL_COMMUNITY): Payer: Self-pay

## 2021-11-11 ENCOUNTER — Ambulatory Visit (INDEPENDENT_AMBULATORY_CARE_PROVIDER_SITE_OTHER): Payer: Medicaid Other | Admitting: Clinical

## 2021-11-11 ENCOUNTER — Encounter: Payer: Self-pay | Admitting: Physical Therapy

## 2021-11-11 ENCOUNTER — Ambulatory Visit: Payer: Medicaid Other | Admitting: Physical Therapy

## 2021-11-11 DIAGNOSIS — E113592 Type 2 diabetes mellitus with proliferative diabetic retinopathy without macular edema, left eye: Secondary | ICD-10-CM | POA: Diagnosis not present

## 2021-11-11 DIAGNOSIS — H4312 Vitreous hemorrhage, left eye: Secondary | ICD-10-CM | POA: Diagnosis not present

## 2021-11-11 DIAGNOSIS — F4321 Adjustment disorder with depressed mood: Secondary | ICD-10-CM | POA: Diagnosis not present

## 2021-11-11 DIAGNOSIS — H35371 Puckering of macula, right eye: Secondary | ICD-10-CM | POA: Diagnosis not present

## 2021-11-11 DIAGNOSIS — R262 Difficulty in walking, not elsewhere classified: Secondary | ICD-10-CM

## 2021-11-11 DIAGNOSIS — M5459 Other low back pain: Secondary | ICD-10-CM

## 2021-11-11 DIAGNOSIS — M62830 Muscle spasm of back: Secondary | ICD-10-CM

## 2021-11-11 DIAGNOSIS — E113511 Type 2 diabetes mellitus with proliferative diabetic retinopathy with macular edema, right eye: Secondary | ICD-10-CM | POA: Diagnosis not present

## 2021-11-11 MED ORDER — OZEMPIC (0.25 OR 0.5 MG/DOSE) 2 MG/3ML ~~LOC~~ SOPN: PEN_INJECTOR | SUBCUTANEOUS | 0 refills | Status: DC

## 2021-11-11 NOTE — Therapy (Signed)
OUTPATIENT PHYSICAL THERAPY TREATMENT NOTE   Patient Name: Maria Hudson MRN: [id number] DOB:[birth year], 61 y.o., female Today's Date: 11/11/2021 PCP: Nolene Ebbs, MD   REFERRING PROVIDER: Gentry Fitz, MD  END OF SESSION:   PT End of Session - 11/11/21 1021     Visit Number 5    Number of Visits 13    Date for PT Re-Evaluation 12/12/21    Authorization Type [place] MEDICAID [facility]    Authorization Time Period 10/23/21-12/22/21    Authorization - Visit Number 4    Authorization - Number of Visits 10    PT Start Time 1020    PT Stop Time 1100    PT Time Calculation (min) 40 min              Past Medical History:  Diagnosis Date   Acute on chronic diastolic heart failure ([place]) 03/05/2021   Anemia    Arthritis    CAD (coronary artery disease)    CHF (congestive heart failure) (HCC)    CKD (chronic kidney disease) stage 4, GFR 15-29 ml/min ([place]) 03/05/2021   Colon polyps    CVA (cerebral vascular accident) ([place])    Depression    Diabetes mellitus without complication ([place])    Hypertension    Hypertensive urgency 04/14/2021   OSA (obstructive sleep apnea) 05/27/2021   Pneumonia    Pure hypercholesterolemia 05/27/2021   Renal disorder    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   Sleep apnea    Stroke [facility])    Past Surgical History:  Procedure Laterality Date   ABDOMINAL HYSTERECTOMY     BIOPSY  09/08/2021   Procedure: BIOPSY;  Surgeon: Doran Stabler, MD;  Location: [facility] END[facility];  Service: Gastroenterology;;   CESAREAN SECTION     ESOPHAGOGASTRODUODENOSCOPY (EGD) WITH PROPOFOL N/A 09/08/2021   Procedure: ESOPHAGOGASTRODUODENOSCOPY (EGD) WITH PROPOFOL;  Surgeon: Doran Stabler, MD;  Location: [facility];  Service: Gastroenterology;  Laterality: N/A;  chest pain, epigastric pain   INNER EAR SURGERY Bilateral    RIGHT HEART CATH N/A 05/14/2021   Procedure: RIGHT HEART CATH;  Surgeon: Larey Dresser, MD;  Location: [place] [facility];  Service:  Cardiovascular;  Laterality: N/A;   TONSILLECTOMY     age 68   Patient Active Problem List   Diagnosis Date Noted   Pure hypercholesterolemia 05/27/2021   Chronic diastolic CHF (congestive heart failure) ([place]) 04/29/2021   Hypertensive urgency 04/14/2021   Acute on chronic diastolic CHF (congestive heart failure) ([place]) 04/14/2021   Chest pain 03/05/2021   CAD (coronary artery disease) 03/05/2021   T2DM (type 2 diabetes mellitus) ([place]) 03/05/2021   CKD (chronic kidney disease) stage 4, GFR 15-29 ml/min (HCC) 03/05/2021   Class 3 obesity ([place]) 03/05/2021   CVA (cerebral vascular accident) ([place]) 03/05/2021   Acute on chronic diastolic heart failure ([place]) 03/05/2021   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   AKI (acute kidney injury) ([place]) 03/05/2021   Vitreous hemorrhage of left eye ([place]) 11/14/2020   Orthopnea 08/06/2020   Vision loss of right eye 08/06/2020   Excessive daytime sleepiness 01/11/2020   Iron deficiency anemia 12/14/2019   Vitreous hemorrhage, right ([place]) 12/04/2019   Diabetic neuropathy ([place]) 07/26/2019   Proliferative diabetic retinopathy of both 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 ([place]) 02/03/2019   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   Conductive hearing loss, bilateral 03/03/2018   Chronic bilateral low back pain without sciatica 08/21/2015   Chronic kidney disease (CKD) stage G3b/A3, moderately decreased  glomerular filtration rate (GFR) between 30-44 mL/min/1.73 square meter and albuminuria creatinine ratio greater than 300 mg/g (HCC) 04/30/2015   Type 2 diabetes mellitus with diabetic polyneuropathy, with long-term current use of insulin ([place]) 02/15/2015   Advance directive on file 05/17/2014   Refusal of blood transfusions as patient is Jehovah's Witness 05/17/2014   OSA treated with BiPAP 02/23/2014   Coronary artery disease 07/07/2013   Severe obesity (BMI >= 40) ([place]) 05/18/2013   Neuropathy 08/65/7846   Diastolic heart failure ([place])  10/16/2012   Essential hypertension 10/16/2012   REFERRING DIAG: Low back pain and SI dysfunction   THERAPY DIAG:  Other low back pain  Muscle spasm of back  Difficulty in walking, not elsewhere classified  PERTINENT HISTORY: Significant hx of heart issues, reports balance issues related to BP dropping with standing, Obesity, OA knees and hips   PRECAUTIONS: None  SUBJECTIVE: Pt reports she has been busy getting ready for an event.    PAIN:  Are you having pain? Yes: NPRS scale: 7/10 Pain location: low back Pain description: ache, throb Aggravating factors: Prolonged sitting, standing, and walking Relieving factors: Rest, pain meds as needed           6-9/10 pain range   OBJECTIVE: (objective measures completed at initial evaluation unless otherwise dated)   DIAGNOSTIC FINDINGS:  Not available in EPIC   PATIENT SURVEYS:  Modified Oswestry 40%    SCREENING FOR RED FLAGS: Bowel or bladder incontinence: No Cauda equina syndrome: No   COGNITION:           Overall cognitive status: Within functional limits for tasks assessed                          SENSATION: WFL   MUSCLE LENGTH: Hamstrings: Right WNLs deg; Left WNLs deg Marcello Moores test: Right WNLs deg; Left WNLs deg   POSTURE:  Forward head, rounded shoulder, decreased kyphosis, increased lordosis   PALPATION: TTP midline lower vertebrae, bilat upper gluteal   LUMBAR ROM:    Active  A/PROM  10/21/2021  Flexion Full No pain  Extension Full pressure LB pain mod  Right lateral flexion Full  R LB pain, mod  Left lateral flexion Full L LB pain, min  Right rotation Full R LB pain, mod  Left rotation Full LB pain, min   (Blank rows = not tested)   LE ROM:                       Grossly WNLs Active  Right 10/21/2021 Left 10/21/2021  Hip flexion      Hip extension      Hip abduction      Hip adduction      Hip internal rotation      Hip external rotation      Knee flexion      Knee extension      Ankle  dorsiflexion      Ankle plantarflexion      Ankle inversion      Ankle eversion       (Blank rows = not tested)   LE MMT:                       Myotomes for legs are equal with no overt weakness, but with decreased strength of the hips. Weak core. MMT Right 10/21/2021 Left 10/21/2021  Hip flexion 4 4  Hip extension 4 4  Hip abduction 4 4  Hip adduction 4 4  Hip internal rotation      Hip external rotation      Knee flexion      Knee extension      Ankle dorsiflexion      Ankle plantarflexion      Ankle inversion      Ankle eversion       (Blank rows = not tested)   LUMBAR SPECIAL TESTS:  Straight leg raise test: Negative and Slump test: Negative   FUNCTIONAL TESTS:  5 times sit to stand: 10/29/21 19.1 sec (hands on thighs) 2 minute walk test: 10/29/21 200 feet (one standing rest break. Frequent staggering due to dizziness).    GAIT: Distance walked: 237f Assistive device utilized: None Level of assistance: Complete Independence Comments: Decreased pace, decreased trunk rotation, valgus bilat legs   TODAY'S TREATMENT   [facility] Adult PT Treatment:                                                DATE: 11/11/21 Therapeutic Exercise: Resting HR- 140/70 bpm Nustep 512m L5 UE/LE PPT hand slides on thighs to engage core x 10 for 5 sec x 2  Bridge 10 x 2 5" LTR 10 sec x 6 Marching c core engagement 4 sets of 10 Supine clam blue band 10 x 2  Figure 4 pull x 2 each 20" Seated ROW 20# x 10  Seated lat pull down 20 #    [facility] Adult PT Treatment:                                                DATE: 11/07/21 Therapeutic Exercise: Nustep 59m62mL4 UE/LE PPT hand slides on thighs to engage core x 10 for 5 sec Marching c core engagement 5 sets of 4 marches  LTR x5  5" Figure 4 pull x 2 each 20" Supine clam green band 10 x 2  Ball squeeze 10 x 2 5" Bridge 5 x 2 5" Seated flexion forward and laterally x2 each 20"  [facility]:                                                 DATE: 11/05/21 Therapeutic Exercise:  PPT -mod cues x 10 for 5 sec  LTR x 10  Figure 4- pull x 2 each  Supine clam green band 10 x 2  Ball squeeze 10 x 2 Bridge 5 x 2  SKTC 20 sec x 3 each   [facility] Adult PT Treatment:                                                DATE: 10/29/21 Therapeutic Exercise: 2 MWT 5 x STS STS x 10 hands PPT -mod cues x 15 for sec  LTR x 10 SKTC  Seated childs pose with laterals 3 reps 20 sec each  Nustep L 5 x 5 minutes UE/LE  INITIAL TREATMENT:  - Supine Lower Trunk Rotation  10 reps 5 hold - Supine Posterior Pelvic Tilt  10 reps 3 hold - Hooklying Single Knee to Chest Stretch 3 reps 20 hold - Seated Flexion Stretch with Swiss Ball  6 reps - 20 hold   PATIENT EDUCATION:  Education details: eval findings, POC, HEP Person educated: Patient Education method: Explanation, Demonstration, Tactile cues, Verbal cues, and Handouts Education comprehension: verbalized understanding, returned demonstration, verbal cues required, and tactile cues required     HOME EXERCISE PROGRAM: Access Code: [email or web address] URL: [email or web address] Date: 11/05/2021 Prepared by: Hessie Diener  Exercises - Supine Lower Trunk Rotation  - 2 x daily - 7 x weekly - 1 sets - 10 reps - 5 hold - Supine Posterior Pelvic Tilt  - 2 x daily - 7 x weekly - 2 sets - 10 reps - 5 hold - Hooklying Single Knee to Chest Stretch  - 2 x daily - 7 x weekly - 1 sets - 3 reps - 20 hold - Seated Flexion Stretch with Swiss Ball  - 2 x daily - 7 x weekly - 2 sets - 3 reps - 20 hold - Hooklying Clamshell with Resistance  - 2 x daily - 7 x weekly - 1-2 sets - 10 reps - 5 hold - Supine Hip Adduction Isometric with Ball  - 1 x daily - 7 x weekly - 1-2 sets - 10 reps - Supine Bridge  - 1 x daily - 7 x weekly - 2 sets - 5 reps   ASSESSMENT:   CLINICAL IMPRESSION: Pt is responding appropriately to PT. She reports increased pain with being busy over the weekend preparing for an event coming  up. She reports compliance with her seated HEP. Pt verbalized interest in [employer] and strengthening machines. Introduced seated row and lat pull down with good tolerance.  Pt tolerated PT today without adverse effects. Pt will continue to benefit from skilled PT to address limitation to optimize function with less pain.   OBJECTIVE IMPAIRMENTS decreased balance, difficulty walking, decreased strength, dizziness, increased muscle spasms, obesity, and pain.    ACTIVITY LIMITATIONS cleaning, community activity, driving, meal prep, occupation, laundry, and shopping.    PERSONAL FACTORS Fitness, Past/current experiences, Time since onset of injury/illness/exacerbation, and 3+ comorbidities:    Significant hx of heart issues, reports balance issues related to BP dropping with standing, Obesity, OA knees and hips are also affecting patient's functional outcome.    REHAB POTENTIAL: Good   CLINICAL DECISION MAKING: Stable/uncomplicated   EVALUATION COMPLEXITY: Low     GOALS:   SHORT TERM GOALS: Target date: 11/13/2021     Pt will be Ind in an initial HEP Baseline:started on eval Status 11/11/21- performs seated version of HEP Goal status: partially met   2.  Pt will voice understanding of measures to assist with the decrease and management of back pain Baseline:  Goal status: INITIAL     LONG TERM GOALS: Target date: 12/12/21   Increase pt's core strength and hip strength to 4+/5  for improved function Baseline: 4/5 Goal status: INITIAL   2.  Pt will report a decrease in back pain to 6/10 or less intermittently with daily activities Baseline: 6-10/10 Goal status: INITIAL   3.  Pt will be able to tolerate walking for 20 mins before needing to sitt down  Baseline: 7 min Goal status: INITIAL   4.  Pt will improve her 5xSTS time by the MCID of 4 sec as indication of  improved function Baseline: TBA Goal status: INITIAL   5.  Pt will improve her 2MWT distance by the MCID of 46f as  indication of improved function Baseline: TBA Goal status: INITIAL   6.  Pt will be Ind in a final HEP to maintain achieved LOF Baseline: started on eval Goal status: INITIAL   7.  Pt's Mod Oswestry score will improve to 28% as indication of improved pain and function Baseline: started on eval Goal status: INITIAL     PLAN: PT FREQUENCY: 2x/week   PT DURATION: 6 weeks   PLANNED INTERVENTIONS: Therapeutic exercises, Therapeutic activity, Balance training, Gait training, Patient/Family education, Joint mobilization, Stair training, Aquatic Therapy, Dry Needling, Electrical stimulation, Spinal manipulation, Spinal mobilization, Cryotherapy, Moist heat, Taping, Traction, Ultrasound, Ionotophoresis 490mml Dexamethasone, and Manual therapy.   PLAN FOR NEXT SESSION: assess response to HEP, progress as able ,check BP at start of session.  pt has dizziness in standing (BP drops)  - has abdominal binder she plans to wear to assist in BP . (No restrictions per cardiologists) . Add piriformis stretch    JeHessie DienerPTA 11/11/21 12:58 PM Phone: [phone]: [phone]

## 2021-11-11 NOTE — Plan of Care (Signed)
  Problem: Depression CCP Problem  1  Goal: STG: Mikiala WILL PARTICIPATE IN AT LEAST 80% OF SCHEDULED INDIVIDUAL PSYCHOTHERAPY SESSIONS Outcome: Progressing   Problem: Depression CCP Problem  1  Goal: LTG: Arhianna WILL SCORE LESS THAN 10 ON THE PATIENT HEALTH QUESTIONNAIRE (PHQ-9) Outcome: Not Progressing

## 2021-11-11 NOTE — Progress Notes (Signed)
THERAPIST PROGRESS NOTE Virtual Visit via Telephone Note  I connected with Maria Hudson on 11/11/21 at  8:00 AM EDT by telephone and verified that I am speaking with the correct person using two identifiers.  Location: Patient: home Provider: office   I discussed the limitations, risks, security and privacy concerns of performing an evaluation and management service by telephone and the availability of in person appointments. I also discussed with the patient that there may be a patient responsible charge related to this service. The patient expressed understanding and agreed to proceed.   Follow Up Instructions: I discussed the assessment and treatment plan with the patient. The patient was provided an opportunity to ask questions and all were answered. The patient agreed with the plan and demonstrated an understanding of the instructions.   The patient was advised to call back or seek an in-person evaluation if the symptoms worsen or if the condition fails to improve as anticipated.   Session Time: 25 minutes  Participation Level: Active  Behavioral Response: CasualAlertEuthymic  Type of Therapy: Individual Therapy  Treatment Goals addressed: client will score less than a 10 on the PHQ-9  ProgressTowards Goals: Not Progressing  Interventions: CBT and Supportive  Summary:  Maria Hudson is a 61 y.o. female who presents for the scheduled session oriented times five, appropriately dressed, and friendly. Client denied hallucinations and delusions. Client reported things have been hectic but okay. Client reported feeling better than she was before. Client reported she is not sure what has helped the positive shift of things. Client reported she was depressed after her sister and they were so close. Client reported she'd been trying to cope with it after a year of her being gone. Client reported her sister was her constant. Client reported she got rid of everything that was her  sisters because she couldn't stand to look at it except her phone. Client reported things that involve sisters tend to trigger her. Client reported she does not think about her sister because it really depresses her. Client reported thinking of fun things with her sister cause she's to cry. Client stated having thoughts of "not being able to deal it". Client recalled seeing how sick her sister was and the progression of her illness. Client reported her sister took care of her and she feels like she wasn't there for her like she wanted to be. Client reported she is religious but still can't seem shake the feeling. Client reported she is still living with her daughter. Client reported she would like her own place but has limited income to find somewhere. Client reported she has several health problems and doesn't want her children to find her dead in the house one day. Client reported she is very transparent with her children about her thoughts and they are empathetic.  Evidence of progress towards goal:  client reported using 0 coping skills to for grief. Client reported 1 negative behavior which is avoidance of her thoughts and emotions.   Suicidal/Homicidal: Nowithout intent/plan  Therapist Response:  Therapist began the appointment asking the client how she has been doing since last seen. Therapist used CBT to engage using active listening and positive emotional support towards her thoughts and feelings. Therapist used CBT to engage using open ended questions about the severity of her grief and depression symptoms. Therapist used CBT to engage having the client describe her cycle of negative thoughts and promote depression. Therapist used CBT to teach about how avoidance worsens depression symptoms. Therapist used  CBT ask the client to identify her progress with frequency of use with coping skills with continued practice in her daily activity.    Therapist assigned the client homework to write down  her negative thoughts. Client was scheduled for next appointment.    Plan: Return again in 5 weeks.  Diagnosis: grief  Collaboration of Care: Patient refused AEB none requested by the client.  Patient/Guardian was advised Release of Information must be obtained prior to any record release in order to collaborate their care with an outside provider. Patient/Guardian was advised if they have not already done so to contact the registration department to sign all necessary forms in order for [place] to release information regarding their care.   Consent: Patient/Guardian gives verbal consent for treatment and assignment of benefits for services provided during this visit. Patient/Guardian expressed understanding and agreed to proceed.   Cassandra, LCSW 11/11/2021

## 2021-11-12 ENCOUNTER — Ambulatory Visit (INDEPENDENT_AMBULATORY_CARE_PROVIDER_SITE_OTHER): Payer: Medicaid Other | Admitting: Vascular Surgery

## 2021-11-12 ENCOUNTER — Encounter: Payer: Self-pay | Admitting: Vascular Surgery

## 2021-11-12 ENCOUNTER — Ambulatory Visit (HOSPITAL_COMMUNITY)
Admission: RE | Admit: 2021-11-12 | Discharge: 2021-11-12 | Disposition: A | Discharge status: Home / Self Care | Payer: Medicaid Other | Source: Ambulatory Visit | Attending: Vascular Surgery | Admitting: Vascular Surgery

## 2021-11-12 ENCOUNTER — Ambulatory Visit (INDEPENDENT_AMBULATORY_CARE_PROVIDER_SITE_OTHER)
Admission: RE | Admit: 2021-11-12 | Discharge: 2021-11-12 | Disposition: A | Discharge status: Home / Self Care | Payer: Medicaid Other | Source: Ambulatory Visit | Attending: Vascular Surgery | Admitting: Vascular Surgery

## 2021-11-12 ENCOUNTER — Other Ambulatory Visit: Payer: Self-pay | Admitting: Obstetrics and Gynecology

## 2021-11-12 VITALS — BP 129/69 | HR 74 | Temp 97.9°F | Resp 20 | Ht 63.5 in | Wt 225.0 lb

## 2021-11-12 DIAGNOSIS — N1832 Chronic kidney disease, stage 3b: Secondary | ICD-10-CM | POA: Diagnosis not present

## 2021-11-12 NOTE — Patient Outreach (Signed)
Medicaid Managed Care   Nurse Care Manager Note  11/12/2021 Name:  Maria Hudson MRN:  [id number] DOB:  1961-05-12  Maria Hudson is an 61 y.o. year old female who is a primary patient of Nolene Ebbs, MD.  The Medicaid Managed Care Coordination team was consulted for assistance with:    Chronic healthcare management needs, CHF, CAD, OSA, chronic pain, DM, HTN, HLD, CKD, OSA, gout, obesity, depression, neuropathy, retinopathy  [name] was given information about Medicaid Managed Care Coordination team services today. [name] Patient agreed to services and verbal consent obtained.  [name] for follow up visit in response to provider referral for case management and/or care coordination services.   Assessments/Interventions:  Review of past medical history, allergies, medications, health status, including review of consultants reports, laboratory and other test data, was performed as part of comprehensive evaluation and provision of chronic care management services.  SDOH (Social Determinants of Health) assessments and interventions performed: SDOH Interventions    Flowsheet Row Most Recent Value  SDOH Interventions   Food Insecurity Interventions Intervention Not Indicated      Care Plan  Allergies  Allergen Reactions   Metformin And Related Nausea Only   Hydrocodone Itching    Patient able to tolerate when taken with Benadryl.   Oxycodone-Acetaminophen Itching    Patient able to tolerate when taken with Benadryl.   Medications Reviewed Today     Reviewed by Gayla Medicus, RN (Registered Nurse) on 11/12/21 at [id number]  Med List Status: <None>   Medication Order Taking? Sig Documenting Provider Last Dose Status Informant  Accu-Chek Softclix Lancets lancets [id number] No 3 (three) times daily. [provider] Taking Active Self  acetaminophen (TYLENOL) 500 MG tablet [id number] No Take 1,000 mg by mouth every 6 (six) hours as needed for  moderate pain or headache. [provider] Taking Active Self  allopurinol (ZYLOPRIM) 100 MG tablet [id number] No Take 100 mg by mouth in the morning. [provider] Taking Active Self  aspirin EC 81 MG tablet [id number] No Take 81 mg by mouth in the morning. Swallow whole. [provider] Taking Active Self           Med Note [name]   Mon Apr 14, 2021  9:22 PM)    atorvastatin (LIPITOR) 40 MG tablet [id number] No Take 1 tablet (40 mg total) by mouth every evening. Maria Latch, MD Taking Active Self  buPROPion [facility]) 100 MG tablet [id number] No Take 100 mg by mouth 2 (two) times daily. [provider] Taking Active Self  carvedilol (COREG) 25 MG tablet [id number] No Take 1 tablet (25 mg total) by mouth 2 (two) times daily with a meal. [name], [name], MD Taking Active Self  cholecalciferol (VITAMIN D3) 25 MCG (1000 UNIT) tablet [id number] No Take 1,000 Units by mouth daily. [provider] Taking Active   cloNIDine (CATAPRES - DOSED IN MG/24 HR) 0.3 mg/24hr patch [id number] No Place 1 patch (0.3 mg total) onto the skin once a week.  Patient taking differently: Place 0.3 mg onto the skin every Thursday.   Maria Latch, MD Taking Active Self  diclofenac Sodium (VOLTAREN) 1 % GEL [id number] No Apply 2 g topically 4 (four) times daily as needed (chest pain.). [provider] Taking Active Self  doxycycline (VIBRAMYCIN) 100 MG capsule [id number] No Take 1 capsule (100 mg total) by mouth 2 (two) times daily. Vanessa Kick, MD Taking Active Self  Med Note Maria Hudson, Maria Hudson Sep 03, 2021  3:54 PM) New medication patient to start 7 day therapy course on 09/03/21  ferrous sulfate 325 (65 FE) MG tablet [id number] No Take 325 mg by mouth 2 (two) times daily. [provider] Taking Active Self  fluticasone (FLONASE) 50 MCG/ACT nasal spray [id number] No Place 1 spray into both nostrils daily as needed for  allergies or rhinitis. [provider] Taking Active Self  hydrALAZINE (APRESOLINE) 100 MG tablet [id number] No Take 100 mg by mouth 3 (three) times daily. [provider] Taking Active Self  insulin glargine (LANTUS) 100 unit/mL SOPN [id number] No Inject 10 Units into the skin at bedtime. [provider] Taking Active Self           Med Note Stanford [place]   Tue Aug 19, 2021  9:37 AM)    isosorbide mononitrate (IMDUR) 60 MG 24 hr tablet [id number] No Take 1.5 tablets (90 mg total) by mouth daily.  Patient taking differently: Take 90 mg by mouth every evening.   [place], Maria Bo, FNP Taking Active Self  loratadine (CLARITIN) 10 MG tablet [id number] No Take 10 mg by mouth in the morning. [provider] Taking Active Self  Multiple Vitamin (MULTIVITAMIN ADULT) TABS [id number] No Take 1 tablet by mouth in the morning. [provider] Taking Active Self  NIFEdipine (ADALAT CC) 60 MG 24 hr tablet [id number] No Take 2 tablets (120 mg total) by mouth daily. Larey Dresser, MD Taking Active Self  nitroGLYCERIN (NITROSTAT) 0.4 MG SL tablet [id number] No Place 1 tablet (0.4 mg total) under the tongue every 5 (five) minutes as needed for chest pain. [name], [name], MD Taking Active Self           Med Note Gilman Buttner, Juanita Laster   Tue Aug 19, 2021  9:35 AM)    Cira Servant FLEXPEN 100 UNIT/ML FlexPen [id number] No Inject 8 Units into the skin 3 (three) times daily with meals. [provider] Taking Active Self           Med Note [name], Maria Hudson   Tue May 06, 2021  5:01 PM)    Omega-3 Fatty Acids (FISH OIL) 1000 MG CAPS [id number] No Take 1,000 mg by mouth in the morning. [provider] Taking Active Self  oxyCODONE-acetaminophen (PERCOCET/ROXICET) 5-325 MG tablet [id number] No Take 1 tablet by mouth 3 (three) times daily as needed for severe pain. [provider] Taking Active   pantoprazole (PROTONIX) 40 MG tablet [id number] No Take  40 mg by mouth daily as needed (upset stomach.). [provider] Taking Active Self  pregabalin (LYRICA) 25 MG capsule [id number] No Take 25 mg by mouth 2 (two) times daily. [provider] Taking Active Self  Semaglutide,0.25 or 0.5MG/DOS, (OZEMPIC, 0.25 OR 0.5 MG/DOSE,) 2 MG/3ML SOPN [id number]  Inject 0.71m under the skin once weekly RSkeet Latch MD  Active   sertraline (ZOLOFT) 50 MG tablet [id number] Take 50 mg by mouth in the morning. [provider] Taking Active Self  torsemide (DEMADEX) 20 MG tablet [id number] Take 1 tablet (20 mg total) by mouth every other day. Patient takes 1 tablet by mouth every other day as needed for fluid retention. MBeatrice JMaricela Bo FNP Taking Active Self  traZODone (DESYREL) 50 MG tablet [id number] Take 50 mg by mouth at bedtime. [provider] Taking Active Self           Patient Active Problem List  Diagnosis Date Noted   Pure hypercholesterolemia 05/27/2021   Chronic diastolic CHF (congestive heart failure) ([place]) 04/29/2021   Hypertensive urgency 04/14/2021   Acute on chronic diastolic CHF (congestive heart failure) ([place]) 04/14/2021   Chest pain 03/05/2021   CAD (coronary artery disease) 03/05/2021   T2DM (type 2 diabetes mellitus) ([place]) 03/05/2021   CKD (chronic kidney disease) stage 4, GFR 15-29 ml/min ([place]) 03/05/2021   Class 3 obesity ([place]) 03/05/2021   CVA (cerebral vascular accident) ([place]) 03/05/2021   Acute on chronic diastolic heart failure ([place]) 03/05/2021   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   AKI (acute kidney injury) ([place]) 03/05/2021   Vitreous hemorrhage of left eye ([place]) 11/14/2020   Orthopnea 08/06/2020   Vision loss of right eye 08/06/2020   Excessive daytime sleepiness 01/11/2020   Iron deficiency anemia 12/14/2019   Vitreous hemorrhage, right ([place]) 12/04/2019   Diabetic neuropathy ([place]) 07/26/2019   Proliferative diabetic retinopathy of both eyes with macular edema associated  with type 2 diabetes mellitus ([place]) 02/03/2019   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   Conductive hearing loss, bilateral 03/03/2018   Chronic bilateral low back pain without sciatica 08/21/2015   Chronic kidney disease (CKD) stage G3b/A3, moderately decreased glomerular filtration rate (GFR) between 30-44 mL/min/1.73 square meter and albuminuria creatinine ratio greater than 300 mg/g ([place]) 04/30/2015   Type 2 diabetes mellitus with diabetic polyneuropathy, with long-term current use of insulin ([place]) 02/15/2015   Advance directive on file 05/17/2014   Refusal of blood transfusions as patient is Jehovah's Witness 05/17/2014   OSA treated with BiPAP 02/23/2014   Coronary artery disease 07/07/2013   Severe obesity (BMI >= 40) ([place]) 05/18/2013   Neuropathy 59/74/1638   Diastolic heart failure ([place]) 10/16/2012   Essential hypertension 10/16/2012   Conditions to be addressed/monitored per PCP order:  Chronic healthcare management needs, CHF, CAD, OSA, chronic pain, DM, HTN, HLD, CKD, OSA, gout, obesity, depression, neuropathy, retinopathy. Vision loss, h/o CVA  Care Plan : RN Care Manager Plan Of Care  Updates made by Gayla Medicus, RN since 11/12/2021 12:00 AM     Problem: Knowledge Deficit and Care Coordination Needs Related to Management of HTN, HF, Type 2 DM   Priority: High     Long-Range Goal: Development of Plan of Care to Address Knowledge Deficits and Care Coordination Needs related to  management of CHF, CAD, HTN, HLD, DMII, CKD Stage 4, and Depression   Start Date: 05/06/2021  Expected End Date: 05/06/2022  Priority: High  Note:   Current Barriers:  Knowledge Deficits related to plan of care for management of CHF, CAD, HTN, HLD, DMII, CKD Stage 4, and Depression 11/12/21:  patient has an appointment this morning for mapping with vascular surgeon,  she continues PT 2 times a week for hip and back pain. Blood pressure and blood sugars stable.  Continues to see Psychiatrist and has  started journaling.    RNCM Clinical Goal(s):  Patient will demonstrate ongoing adherence to prescribed treatment plan for CHF, CAD, HTN, HLD, DMII, CKD Stage 4, and Depression as evidenced by patient report of improved health and quality of life and no unplanned ED or unplanned hospital admissions continue to work with RN Care Manager and/or Social Worker to address care management and care coordination needs related to CHF, CAD, HTN, HLD, DMII, CKD Stage 4, and Depression as evidenced by adherence to CM Team Scheduled appointments      Work with [facility] LCSW and psychiatrist to treat depression  Interventions: Inter-disciplinary care team  collaboration (see longitudinal plan of care) Evaluation of current treatment plan related to  self management and patient's adherence to plan as established by provider Mailed summary of benefits for her [place] plan to her home address   Heart Failure Interventions:  (Status: Goal on Track (progressing): YES.)  Long Term Goal - patient states her fluid retention has significantly improved,  says she cannot tolerate the compression stockings because they are too tight and make her legs hurt Assessed patient's heart failure management strategies  Diabetes:  (Status: Goal on Track (progressing): YES.) Long Term Goal - patient says her CBGS have been quite variable and she is not sure why, denies hypoglycemia or sustained hyperglycemia  Lab Results  Component Value Date   HGBA1C 6.7 (H) 04/29/2021    Assessed patient's understanding of A1C goal: <7% Reviewed medications with patient and discussed importance of medication adherence;        Counseled on importance of regular laboratory monitoring as prescribed;        Discussed plans with patient for ongoing care management follow up and provided patient with direct contact information for care management team;      Advised patient, providing education and rationale, to check cbg three times  daily and record        call provider for findings outside established parameters;       Review of patient status, including review of consultants reports, relevant laboratory and other test results, and medications completed;       Discussed addition of Ozempic to medication regimen , reviewed mechanism of action and proven cardiovascular benefits  Hypertension and CKD stage 4 : (Status: Goal on Track (progressing): YES.) Long Term Goal- per patient blood pressure continues to be very difficult to control due to orthostatic hypotension episodes and elevated when sitting- patient is now working with cardiology pharmacist to assist with management, patient says she will have HD access placed in the near future and has been placed on kidney transplant waiting list Last practice recorded BP readings:   BP Readings from Last 3 Encounters:  09/30/21 120/70  09/12/21 (!) 107/58  09/08/21 (!) 144/71     Most recent eGFR/CrCl:    No components found for: CRCL Lab Results  Component Value Date   NA 136 08/19/2021   K 3.9 08/19/2021   CREATININE 2.51 (H) 08/19/2021   EGFR 19 (L) 03/13/2021   GFRNONAA 22 (L) 08/19/2021   GLUCOSE 122 (H) 08/19/2021    Evaluation of current treatment plan related to hypertension self management and patient's adherence to plan as established by provider;   Reviewed medications with patient and discussed importance of compliance;  Discussed plans with patient for ongoing care management follow up and provided patient with direct contact information for care management team; Advised patient, providing education and rationale, to monitor blood pressure daily and record, calling PCP for findings outside established parameters;  Reviewed scheduled/upcoming provider appointments   Weight Loss Interventions:  (Status:  New goal.) Long Term Goal- patient voiced concern about recent 15 lb weight gain since kneecap fx on 2/13, she says she would like to resume Victoza or  another drug in that class to help her with weight loss, glycemic control and to decrease risk of stroke, MI and CV death-patient has started Ozempic Discussed indications, mechanism of action, common side effects and CV benefits  of GLP1 Ozempic Assessed tolerance of Ozempic  Interdisciplinary Collaboration:  (Status: Goal on Track (progressing): YES.) Long Term Goal  Collaborated with BSW to initiate plan of care to address needs related to Housing barriers, Inability to perform ADL's independently, and Inability to perform IADL's independently in patient with CHF, CAD, HTN, HLD, DM, CKD Stage 4, and Depression Assessed status of patient's involvement with behavioral health counselor Collaboration with pharmacist for medication review, discussion of ? Medication side effects causing chronic constipation and arranging for patient to received home delivery of medications in blister packs Collaboration with Nolene Ebbs, MD regarding development and update of comprehensive plan of care as evidenced by provider attestation and co-signature Inter-disciplinary care team collaboration (see longitudinal plan of care)  Patient Goals/Self-Care Activities: Take medications as prescribed   Attend all scheduled provider appointments Call pharmacy for medication refills 3-7 days in advance of running out of medications Attend church or other social activities Perform all self care activities independently  Call provider office for new concerns or questions  Work with the social worker to address care coordination needs and will continue to work with the clinical team to address health care and disease management related needs call office if I gain more than 2 pounds in one day or 5 pounds in one week keep legs up while sitting track weight in diary use salt in moderation watch for swelling in feet, ankles and legs every day weigh myself daily know when to call the doctorfor fluid weight gain or  consistently abnormal BP readings at home track symptoms and what helps feel better or worse check blood sugar at prescribed times: three times daily enter blood sugar readings and medication or insulin into daily log take the blood sugar log to all doctor visits take the blood sugar meter to all doctor visits eat fish at least once per week fill half of plate with vegetables manage portion size check blood pressure daily write blood pressure results in a log or diary learn about high blood pressure take blood pressure log to all doctor appointments call doctor for signs and symptoms of high blood pressure keep all doctor appointments take medications for blood pressure exactly as prescribed Limit fluid intake to 1 liter per day Collaborate with managed Medicaid team of BSW and pharmacist to address identified needs Work with cardiology pharmacist to assist with BP stabilization Work with [facility] LCSW and psychiatrist to treat depression Review summary of [employer] benefits mailed to home address and call to enroll in program that will benefit your chronic health issues Continue to use [facility] calendar to record weight , BP and CBGs so readings are all in one place   Follow Up:  Patient agrees to Care Plan and Follow-up.  Plan: The Managed Medicaid care management team will reach out to the patient again over the next 30 days. and The  Patient has been provided with contact information for the Managed Medicaid care management team and has been advised to call with any health related questions or concerns.  Date/time of next scheduled RN care management/care coordination outreach:  12/11/21 at 0900

## 2021-11-12 NOTE — Progress Notes (Signed)
Patient ID: Maria Hudson, female   DOB: [birth year], 61 y.o.   MRN: [id number]  Reason for Consult: New Patient (Initial Visit)   Referred by Nolene Ebbs, MD  Subjective:     HPI:  Maria Hudson is a 61 y.o. female no previous history of dialysis did have a sister on dialysis she unfortunately has passed away.  She is right-hand dominant.  No previous chest, breast or left upper extremity surgeries.  She does not take blood thinners.  Past Medical History:  Diagnosis Date   Acute on chronic diastolic heart failure ([place]) 03/05/2021   Anemia    Arthritis    CAD (coronary artery disease)    CHF (congestive heart failure) (HCC)    CKD (chronic kidney disease) stage 4, GFR 15-29 ml/min (HCC) 03/05/2021   Colon polyps    CVA (cerebral vascular accident) ([place])    Depression    Diabetes mellitus without complication ([place])    Hypertension    Hypertensive urgency 04/14/2021   OSA (obstructive sleep apnea) 05/27/2021   Pneumonia    Pure hypercholesterolemia 05/27/2021   Renal disorder    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   Sleep apnea    Stroke [facility])    Family History  Problem Relation Age of Onset   Hypertension Mother    Stroke Mother    Diabetes Mother    Other Mother        enlarged heart   Hypertension Father    Hypertension Sister    Diabetes Sister    CAD Sister        enlarged heart   Kidney disease Sister    Diabetes Paternal Grandmother    Hypertension Daughter    Diabetes Daughter    Hypertension Son    Diabetes Son    Breast cancer Paternal Aunt    Past Surgical History:  Procedure Laterality Date   ABDOMINAL HYSTERECTOMY     BIOPSY  09/08/2021   Procedure: BIOPSY;  Surgeon: Doran Stabler, MD;  Location: [facility];  Service: Gastroenterology;;   CESAREAN SECTION     ESOPHAGOGASTRODUODENOSCOPY (EGD) WITH PROPOFOL N/A 09/08/2021   Procedure: ESOPHAGOGASTRODUODENOSCOPY (EGD) WITH PROPOFOL;  Surgeon: Doran Stabler, MD;  Location: [facility];  Service: Gastroenterology;  Laterality: N/A;  chest pain, epigastric pain   INNER EAR SURGERY Bilateral    RIGHT HEART CATH N/A 05/14/2021   Procedure: RIGHT HEART CATH;  Surgeon: Larey Dresser, MD;  Location: [facility];  Service: Cardiovascular;  Laterality: N/A;   TONSILLECTOMY     age 44    Short Social History:  Social History   Tobacco Use   Smoking status: Never    Passive exposure: Never   Smokeless tobacco: Never  Substance Use Topics   Alcohol use: Not Currently    Allergies  Allergen Reactions   Metformin And Related Nausea Only   Hydrocodone Itching    Patient able to tolerate when taken with Benadryl.   Oxycodone-Acetaminophen Itching    Patient able to tolerate when taken with Benadryl.    Current Outpatient Medications  Medication Sig Dispense Refill   Accu-Chek Softclix Lancets lancets 3 (three) times daily.     acetaminophen (TYLENOL) 500 MG tablet Take 1,000 mg by mouth every 6 (six) hours as needed for moderate pain or headache.     allopurinol (ZYLOPRIM) 100 MG tablet Take 100 mg by mouth in the morning.     aspirin EC 81 MG tablet Take 81 mg by  mouth in the morning. Swallow whole.     atorvastatin (LIPITOR) 40 MG tablet Take 1 tablet (40 mg total) by mouth every evening. 90 tablet 3   buPROPion (WELLBUTRIN) 100 MG tablet Take 100 mg by mouth 2 (two) times daily.     carvedilol (COREG) 25 MG tablet Take 1 tablet (25 mg total) by mouth 2 (two) times daily with a meal. 60 tablet 0   cholecalciferol (VITAMIN D3) 25 MCG (1000 UNIT) tablet Take 1,000 Units by mouth daily.     cloNIDine (CATAPRES - DOSED IN MG/24 HR) 0.3 mg/24hr patch Place 1 patch (0.3 mg total) onto the skin once a week. (Patient taking differently: Place 0.3 mg onto the skin every Thursday.) 4 patch 12   diclofenac Sodium (VOLTAREN) 1 % GEL Apply 2 g topically 4 (four) times daily as needed (chest pain.).     doxycycline (VIBRAMYCIN) 100 MG capsule Take 1 capsule (100 mg  total) by mouth 2 (two) times daily. 14 capsule 0   ferrous sulfate 325 (65 FE) MG tablet Take 325 mg by mouth 2 (two) times daily.     fluticasone (FLONASE) 50 MCG/ACT nasal spray Place 1 spray into both nostrils daily as needed for allergies or rhinitis.     hydrALAZINE (APRESOLINE) 100 MG tablet Take 100 mg by mouth 3 (three) times daily.     insulin glargine (LANTUS) 100 unit/mL SOPN Inject 10 Units into the skin at bedtime.     isosorbide mononitrate (IMDUR) 60 MG 24 hr tablet Take 1.5 tablets (90 mg total) by mouth daily. (Patient taking differently: Take 90 mg by mouth every evening.) 135 tablet 3   loratadine (CLARITIN) 10 MG tablet Take 10 mg by mouth in the morning.     Multiple Vitamin (MULTIVITAMIN ADULT) TABS Take 1 tablet by mouth in the morning.     NIFEdipine (ADALAT CC) 60 MG 24 hr tablet Take 2 tablets (120 mg total) by mouth daily. 60 tablet 11   nitroGLYCERIN (NITROSTAT) 0.4 MG SL tablet Place 1 tablet (0.4 mg total) under the tongue every 5 (five) minutes as needed for chest pain. 20 tablet 0   NOVOLOG FLEXPEN 100 UNIT/ML FlexPen Inject 8 Units into the skin 3 (three) times daily with meals.     Omega-3 Fatty Acids (FISH OIL) 1000 MG CAPS Take 1,000 mg by mouth in the morning.     oxyCODONE-acetaminophen (PERCOCET/ROXICET) 5-325 MG tablet Take 1 tablet by mouth 3 (three) times daily as needed for severe pain.     pantoprazole (PROTONIX) 40 MG tablet Take 40 mg by mouth daily as needed (upset stomach.).     pregabalin (LYRICA) 25 MG capsule Take 25 mg by mouth 2 (two) times daily.     Semaglutide,0.25 or 0.5MG /DOS, (OZEMPIC, 0.25 OR 0.5 MG/DOSE,) 2 MG/3ML SOPN Inject 0.5mg  under the skin once weekly 3 mL 0   sertraline (ZOLOFT) 50 MG tablet Take 50 mg by mouth in the morning.     torsemide (DEMADEX) 20 MG tablet Take 1 tablet (20 mg total) by mouth every other day. Patient takes 1 tablet by mouth every other day as needed for fluid retention. 15 tablet 3   traZODone (DESYREL) 50  MG tablet Take 50 mg by mouth at bedtime.     gabapentin (NEURONTIN) 100 MG capsule Take 100-300 mg by mouth at bedtime.     sodium bicarbonate 650 MG tablet Take 650 mg by mouth 2 (two) times daily.     spironolactone (ALDACTONE) 25  MG tablet Take 25 mg by mouth daily.     No current facility-administered medications for this visit.    Review of Systems  Constitutional:  Constitutional negative. Eyes: Eyes negative.  Respiratory: Respiratory negative.  Cardiovascular: Cardiovascular negative.  GI: Gastrointestinal negative.  Musculoskeletal: Musculoskeletal negative.  Skin: Skin negative.  Neurological: Neurological negative. Hematologic: Hematologic/lymphatic negative.  Psychiatric: Psychiatric negative.       Objective:  Objective   Vitals:   11/12/21 1223  BP: 129/69  Pulse: 74  Resp: 20  Temp: 97.9 F (36.6 C)  SpO2: 93%  Weight: 225 lb (102.1 kg)  Height: 5' 3.5" (1.613 m)   Body mass index is 39.23 kg/m.  Physical Exam HENT:     Head: Normocephalic.     Nose: Nose normal.  Eyes:     Pupils: Pupils are equal, round, and reactive to light.  Cardiovascular:     Rate and Rhythm: Normal rate.     Pulses:          Radial pulses are 2+ on the right side and 2+ on the left side.  Pulmonary:     Effort: Pulmonary effort is normal.  Abdominal:     General: Abdomen is flat.     Palpations: Abdomen is soft.  Musculoskeletal:        General: Normal range of motion.     Right lower leg: No edema.     Left lower leg: No edema.  Skin:    General: Skin is warm and dry.     Capillary Refill: Capillary refill takes less than 2 seconds.  Neurological:     General: No focal deficit present.     Mental Status: She is alert.  Psychiatric:        Mood and Affect: Mood normal.        Behavior: Behavior normal.        Thought Content: Thought content normal.    Data: Right Pre-Dialysis Findings:   +-----------------------+----------+--------------------+---------+--------  +  Location               PSV (cm/s)Intralum. Diam. (cm)Waveform  Comments  +-----------------------+----------+--------------------+---------+--------  +  Brachial Antecub. fossa107       0.53                triphasic            +-----------------------+----------+--------------------+---------+--------  +  Radial Art at Wrist    104       0.28                triphasic            +-----------------------+----------+--------------------+---------+--------  +  Ulnar Art at Wrist     103       0.22                triphasic            +-----------------------+----------+--------------------+---------+--------  +      Left Pre-Dialysis Findings:  +-----------------------+----------+--------------------+---------+--------  +  Location               PSV (cm/s)Intralum. Diam. (cm)Waveform  Comments  +-----------------------+----------+--------------------+---------+--------  +  Brachial Antecub. fossa128       0.47                triphasic            +-----------------------+----------+--------------------+---------+--------  +  Radial Art at Wrist    106       0.28  triphasic            +-----------------------+----------+--------------------+---------+--------  +  Ulnar Art at Wrist     103       0.28                triphasic            +-----------------------+----------+--------------------+---------+--------   Right Cephalic   Diameter (cm)Depth (cm)Findings   +-----------------+-------------+----------+---------+  Shoulder             0.28        1.20              +-----------------+-------------+----------+---------+  Prox upper arm       0.36        0.84              +-----------------+-------------+----------+---------+  Mid upper arm        0.40        0.61               +-----------------+-------------+----------+---------+  Dist upper arm       0.45        0.50              +-----------------+-------------+----------+---------+  Antecubital fossa    0.59        0.22              +-----------------+-------------+----------+---------+  Prox forearm         0.41        0.49   branching  +-----------------+-------------+----------+---------+  Mid forearm          0.29        0.54              +-----------------+-------------+----------+---------+  Dist forearm         0.35        0.20   branching  +-----------------+-------------+----------+---------+   +-----------------+-------------+----------+---------+  Right Basilic    Diameter (cm)Depth (cm)Findings   +-----------------+-------------+----------+---------+  Prox upper arm       0.18        1.34              +-----------------+-------------+----------+---------+  Mid upper arm        0.18        1.25              +-----------------+-------------+----------+---------+  Dist upper arm       0.19        1.14              +-----------------+-------------+----------+---------+  Antecubital fossa    0.24        0.88   branching  +-----------------+-------------+----------+---------+  Prox forearm         0.21        0.26              +-----------------+-------------+----------+---------+   +-----------------+-------------+----------+---------+  Left Cephalic    Diameter (cm)Depth (cm)Findings   +-----------------+-------------+----------+---------+  Shoulder             0.31        1.11              +-----------------+-------------+----------+---------+  Prox upper arm       0.33        1.05              +-----------------+-------------+----------+---------+  Mid upper arm        0.29        0.61              +-----------------+-------------+----------+---------+  Dist upper arm       0.37        0.37               +-----------------+-------------+----------+---------+  Antecubital fossa    0.44        0.22              +-----------------+-------------+----------+---------+  Prox forearm         0.35        0.65   branching  +-----------------+-------------+----------+---------+  Mid forearm          0.21        0.63              +-----------------+-------------+----------+---------+  Dist forearm         0.22        0.24              +-----------------+-------------+----------+---------+   +-----------------+-------------+----------+---------+  Left Basilic     Diameter (cm)Depth (cm)Findings   +-----------------+-------------+----------+---------+  Prox upper arm       0.34        1.50              +-----------------+-------------+----------+---------+  Mid upper arm        0.27        1.32   branching  +-----------------+-------------+----------+---------+  Dist upper arm       0.21        0.86              +-----------------+-------------+----------+---------+  Antecubital fossa    0.20        0.41   branching  +-----------------+-------------+----------+---------+  Prox forearm         0.11        0.27              +-----------------+-------------+----------+---------+        Assessment/Plan:    61 year old female with chronic kidney disease.  Plan will be for left upper extremity arteriovenous fistula either cephalic or basilic vein.  I discussed her other options including catheter and graft if we cannot create a fistula she demonstrates good understanding.    Waynetta Sandy MD [facility]

## 2021-11-12 NOTE — Patient Instructions (Signed)
Hi Ms. Giovanetti, thank you for speaking with me this morning-have a nice day!!  Ms. Henault was given information about Medicaid Managed Care team care coordination services as a part of their [facility] Medicaid benefit. Rasheema Truluck verbally consented to engagement with the [facility] Managed Care team.   If you are experiencing a medical emergency, please call 911 or report to your local emergency department or urgent care.   If you have a non-emergency medical problem during routine business hours, please contact your provider's office and ask to speak with a nurse.   For questions related to your [facility] health plan, please call: [phone] or go here:[email or web address]  If you would like to schedule transportation through your [facility] plan, please call the following number at least 2 days in advance of your appointment: [phone].  You can also use the MTM portal or MTM mobile app to manage your rides. For the portal, please go to [email or web address].  Call the [place] at [phone], at any time, 24 hours a day, 7 days a week. If you are in danger or need immediate medical attention call 911.  If you would like help to quit smoking, call 1-800-QUIT-NOW (509)771-3749) OR Espaol: 1-855-Djelo-Ya (9-024-097-3532) o para ms informacin haga clic aqu or Text READY to 200-400 to register via text  Ms. Holaday - following are the goals we discussed in your visit today:   Goals Addressed    Timeframe:  Long-Range Goal Priority:  High Start Date:     11/12/21                        Expected End Date:  ongoing                     Follow Up Date 12/12/21    - schedule appointment for vaccines needed due to my age or health - schedule recommended health tests (blood work, mammogram, colonoscopy, pap test) - schedule and keep appointment for annual check-up    Why is this important?   Screening tests can find diseases early when they are easier  to treat.  Your doctor or nurse will talk with you about which tests are important for you.  Getting shots for common diseases like the flu and shingles will help prevent them.   Patient verbalizes understanding of instructions and care plan provided today and agrees to view in [place]. Active MyChart status and patient understanding of how to access instructions and care plan via MyChart confirmed with patient.     The Managed Medicaid care management team will reach out to the patient again over the next 30 days.  The  Patient  has been provided with contact information for the Managed Medicaid care management team and has been advised to call with any health related questions or concerns.   Aida Raider RN, BSN [place] Management Coordinator - Managed Medicaid High Risk [phone]   Following is a copy of your plan of care:  Care Plan : [place]  Updates made by Gayla Medicus, RN since 11/12/2021 12:00 AM     Problem: Knowledge Deficit and Care Coordination Needs Related to Management of HTN, HF, Type 2 DM   Priority: High     Long-Range Goal: Development of Plan of Care to Address Knowledge Deficits and Care Coordination Needs related to  management of CHF, CAD, HTN, HLD, DMII, CKD Stage  4, and Depression   Start Date: 05/06/2021  Expected End Date: 05/06/2022  Priority: High  Note:   Current Barriers:  Knowledge Deficits related to plan of care for management of CHF, CAD, HTN, HLD, DMII, CKD Stage 4, and Depression 11/12/21:  patient has an appointment this morning for mapping with vascular surgeon,  she continues PT 2 times a week for hip and back pain. Blood pressure and blood sugars stable.  Continues to see Psychiatrist and has started journaling.    RNCM Clinical Goal(s):  Patient will demonstrate ongoing adherence to prescribed treatment plan for CHF, CAD, HTN, HLD, DMII, CKD Stage 4, and Depression as evidenced by  patient report of improved health and quality of life and no unplanned ED or unplanned hospital admissions continue to work with RN Care Manager and/or Social Worker to address care management and care coordination needs related to CHF, CAD, HTN, HLD, DMII, CKD Stage 4, and Depression as evidenced by adherence to CM Team Scheduled appointments      Work with [facility] LCSW and psychiatrist to treat depression  Interventions: Inter-disciplinary care team collaboration (see longitudinal plan of care) Evaluation of current treatment plan related to  self management and patient's adherence to plan as established by provider Mailed summary of benefits for her [place] Medicaid plan to her home address   Heart Failure Interventions:  (Status: Goal on Track (progressing): YES.)  Long Term Goal - patient states her fluid retention has significantly improved,  says she cannot tolerate the compression stockings because they are too tight and make her legs hurt Assessed patient's heart failure management strategies  Diabetes:  (Status: Goal on Track (progressing): YES.) Long Term Goal - patient says her CBGS have been quite variable and she is not sure why, denies hypoglycemia or sustained hyperglycemia  Lab Results  Component Value Date   HGBA1C 6.7 (H) 04/29/2021    Assessed patient's understanding of A1C goal: <7% Reviewed medications with patient and discussed importance of medication adherence;        Counseled on importance of regular laboratory monitoring as prescribed;        Discussed plans with patient for ongoing care management follow up and provided patient with direct contact information for care management team;      Advised patient, providing education and rationale, to check cbg three times daily and record        call provider for findings outside established parameters;       Review of patient status, including review of consultants reports, relevant laboratory and other test  results, and medications completed;       Discussed addition of Ozempic to medication regimen , reviewed mechanism of action and proven cardiovascular benefits  Hypertension and CKD stage 4 : (Status: Goal on Track (progressing): YES.) Long Term Goal- per patient blood pressure continues to be very difficult to control due to orthostatic hypotension episodes and elevated when sitting- patient is now working with cardiology pharmacist to assist with management, patient says she will have HD access placed in the near future and has been placed on kidney transplant waiting list Last practice recorded BP readings:   BP Readings from Last 3 Encounters:  09/30/21 120/70  09/12/21 (!) 107/58  09/08/21 (!) 144/71     Most recent eGFR/CrCl:    No components found for: CRCL Lab Results  Component Value Date   NA 136 08/19/2021   K 3.9 08/19/2021   CREATININE 2.51 (H) 08/19/2021   EGFR 19 (  L) 03/13/2021   GFRNONAA 22 (L) 08/19/2021   GLUCOSE 122 (H) 08/19/2021    Evaluation of current treatment plan related to hypertension self management and patient's adherence to plan as established by provider;   Reviewed medications with patient and discussed importance of compliance;  Discussed plans with patient for ongoing care management follow up and provided patient with direct contact information for care management team; Advised patient, providing education and rationale, to monitor blood pressure daily and record, calling PCP for findings outside established parameters;  Reviewed scheduled/upcoming provider appointments   Weight Loss Interventions:  (Status:  New goal.) Long Term Goal- patient voiced concern about recent 15 lb weight gain since kneecap fx on 2/13, she says she would like to resume Victoza or another drug in that class to help her with weight loss, glycemic control and to decrease risk of stroke, MI and CV death-patient has started Ozempic Discussed indications, mechanism of action,  common side effects and CV benefits  of GLP1 Ozempic Assessed tolerance of Ozempic  Interdisciplinary Collaboration:  (Status: Goal on Track (progressing): YES.) Long Term Goal  Collaborated with BSW to initiate plan of care to address needs related to Housing barriers, Inability to perform ADL's independently, and Inability to perform IADL's independently in patient with CHF, CAD, HTN, HLD, DM, CKD Stage 4, and Depression Assessed status of patient's involvement with behavioral health counselor Collaboration with pharmacist for medication review, discussion of ? Medication side effects causing chronic constipation and arranging for patient to received home delivery of medications in blister packs Collaboration with Nolene Ebbs, MD regarding development and update of comprehensive plan of care as evidenced by provider attestation and co-signature Inter-disciplinary care team collaboration (see longitudinal plan of care)  Patient Goals/Self-Care Activities: Take medications as prescribed   Attend all scheduled provider appointments Call pharmacy for medication refills 3-7 days in advance of running out of medications Attend church or other social activities Perform all self care activities independently  Call provider office for new concerns or questions  Work with the social worker to address care coordination needs and will continue to work with the clinical team to address health care and disease management related needs call office if I gain more than 2 pounds in one day or 5 pounds in one week keep legs up while sitting track weight in diary use salt in moderation watch for swelling in feet, ankles and legs every day weigh myself daily know when to call the doctorfor fluid weight gain or consistently abnormal BP readings at home track symptoms and what helps feel better or worse check blood sugar at prescribed times: three times daily enter blood sugar readings and medication or  insulin into daily log take the blood sugar log to all doctor visits take the blood sugar meter to all doctor visits eat fish at least once per week fill half of plate with vegetables manage portion size check blood pressure daily write blood pressure results in a log or diary learn about high blood pressure take blood pressure log to all doctor appointments call doctor for signs and symptoms of high blood pressure keep all doctor appointments take medications for blood pressure exactly as prescribed Limit fluid intake to 1 liter per day Collaborate with managed Medicaid team of BSW and pharmacist to address identified needs Work with cardiology pharmacist to assist with BP stabilization Work with [facility] LCSW and psychiatrist to treat depression Review summary of Well[employer] benefits mailed to home address and call to enroll in  program that will benefit your chronic health issues Continue to use [place] [employer] calendar to record weight , BP and CBGs so readings are all in one place

## 2021-11-13 ENCOUNTER — Encounter: Payer: Self-pay | Admitting: Physical Therapy

## 2021-11-13 ENCOUNTER — Ambulatory Visit: Payer: Medicaid Other | Attending: Family Medicine | Admitting: Physical Therapy

## 2021-11-13 DIAGNOSIS — M62830 Muscle spasm of back: Secondary | ICD-10-CM

## 2021-11-13 DIAGNOSIS — R262 Difficulty in walking, not elsewhere classified: Secondary | ICD-10-CM | POA: Diagnosis not present

## 2021-11-13 DIAGNOSIS — M5459 Other low back pain: Secondary | ICD-10-CM

## 2021-11-13 DIAGNOSIS — Z419 Encounter for procedure for purposes other than remedying health state, unspecified: Secondary | ICD-10-CM | POA: Diagnosis not present

## 2021-11-13 DIAGNOSIS — G4733 Obstructive sleep apnea (adult) (pediatric): Secondary | ICD-10-CM | POA: Diagnosis not present

## 2021-11-13 NOTE — Therapy (Signed)
OUTPATIENT PHYSICAL THERAPY TREATMENT NOTE   Patient Name: Maria Hudson MRN: [id number] DOB:January 05, 1961, 61 y.o., female Today's Date: 11/13/2021 PCP: Nolene Ebbs, MD   REFERRING PROVIDER: Gentry Fitz, MD  END OF SESSION:   PT End of Session - 11/13/21 0810     Visit Number 6    Number of Visits 13    Date for PT Re-Evaluation 12/12/21    Authorization Type [place] MEDICAID [facility]    Authorization Time Period 10/23/21-12/22/21    Authorization - Visit Number 5    Authorization - Number of Visits 10    PT Start Time 0804    PT Stop Time 0845    PT Time Calculation (min) 41 min              Past Medical History:  Diagnosis Date   Acute on chronic diastolic heart failure ([place]) 03/05/2021   Anemia    Arthritis    CAD (coronary artery disease)    CHF (congestive heart failure) (HCC)    CKD (chronic kidney disease) stage 4, GFR 15-29 ml/min (HCC) 03/05/2021   Colon polyps    CVA (cerebral vascular accident) ([place])    Depression    Diabetes mellitus without complication ([place])    Hypertension    Hypertensive urgency 04/14/2021   OSA (obstructive sleep apnea) 05/27/2021   Pneumonia    Pure hypercholesterolemia 05/27/2021   Renal disorder    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   Sleep apnea    Stroke [facility])    Past Surgical History:  Procedure Laterality Date   ABDOMINAL HYSTERECTOMY     BIOPSY  09/08/2021   Procedure: BIOPSY;  Surgeon: Doran Stabler, MD;  Location: [facility];  Service: Gastroenterology;;   CESAREAN SECTION     ESOPHAGOGASTRODUODENOSCOPY (EGD) WITH PROPOFOL N/A 09/08/2021   Procedure: ESOPHAGOGASTRODUODENOSCOPY (EGD) WITH PROPOFOL;  Surgeon: Doran Stabler, MD;  Location: [facility];  Service: Gastroenterology;  Laterality: N/A;  chest pain, epigastric pain   INNER EAR SURGERY Bilateral    RIGHT HEART CATH N/A 05/14/2021   Procedure: RIGHT HEART CATH;  Surgeon: Larey Dresser, MD;  Location: [place] [facility];  Service:  Cardiovascular;  Laterality: N/A;   TONSILLECTOMY     age 9   Patient Active Problem List   Diagnosis Date Noted   Pure hypercholesterolemia 05/27/2021   Chronic diastolic CHF (congestive heart failure) ([place]) 04/29/2021   Hypertensive urgency 04/14/2021   Acute on chronic diastolic CHF (congestive heart failure) ([place]) 04/14/2021   Chest pain 03/05/2021   CAD (coronary artery disease) 03/05/2021   T2DM (type 2 diabetes mellitus) ([place]) 03/05/2021   CKD (chronic kidney disease) stage 4, GFR 15-29 ml/min (HCC) 03/05/2021   Class 3 obesity ([place]) 03/05/2021   CVA (cerebral vascular accident) ([place]) 03/05/2021   Acute on chronic diastolic heart failure ([place]) 03/05/2021   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   AKI (acute kidney injury) ([place]) 03/05/2021   Vitreous hemorrhage of left eye ([place]) 11/14/2020   Orthopnea 08/06/2020   Vision loss of right eye 08/06/2020   Excessive daytime sleepiness 01/11/2020   Iron deficiency anemia 12/14/2019   Vitreous hemorrhage, right ([place]) 12/04/2019   Diabetic neuropathy ([place]) 07/26/2019   Proliferative diabetic retinopathy of both 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 ([place]) 02/03/2019   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   Conductive hearing loss, bilateral 03/03/2018   Chronic bilateral low back pain without sciatica 08/21/2015   Chronic kidney disease (CKD) stage G3b/A3, moderately decreased  glomerular filtration rate (GFR) between 30-44 mL/min/1.73 square meter and albuminuria creatinine ratio greater than 300 mg/g (HCC) 04/30/2015   Type 2 diabetes mellitus with diabetic polyneuropathy, with long-term current use of insulin ([place]) 02/15/2015   Advance directive on file 05/17/2014   Refusal of blood transfusions as patient is Jehovah's Witness 05/17/2014   OSA treated with BiPAP 02/23/2014   Coronary artery disease 07/07/2013   Severe obesity (BMI >= 40) ([place]) 05/18/2013   Neuropathy 14/78/2956   Diastolic heart failure ([place])  10/16/2012   Essential hypertension 10/16/2012   REFERRING DIAG: Low back pain and SI dysfunction   THERAPY DIAG:  Other low back pain  Muscle spasm of back  Difficulty in walking, not elsewhere classified  PERTINENT HISTORY: Significant hx of heart issues, reports balance issues related to BP dropping with standing, Obesity, OA knees and hips   PRECAUTIONS: None  SUBJECTIVE: Pt reports less pain today at the average of 5/10. She walked in [place] today for 1 hour shopping and reports that her back did not lock up like usual. Today she reports her eye was injected two days ago to decrease the bleeding in her eye from diabetes. She also saw Cardiologist yesterday and found that she has gained 10 lb in fluid over the last few days. She will start a strong water pill today to reduce the fluid.   PAIN:  Are you having pain? Yes: NPRS scale: 5/10 Pain location: low back Pain description: ache, throb Aggravating factors: Prolonged sitting, standing, and walking Relieving factors: Rest, pain meds as needed           6-9/10 pain range   OBJECTIVE: (objective measures completed at initial evaluation unless otherwise dated)   DIAGNOSTIC FINDINGS:  Not available in EPIC   PATIENT SURVEYS:  Modified Oswestry 40%    SCREENING FOR RED FLAGS: Bowel or bladder incontinence: No Cauda equina syndrome: No   COGNITION:           Overall cognitive status: Within functional limits for tasks assessed                          SENSATION: WFL   MUSCLE LENGTH: Hamstrings: Right WNLs deg; Left WNLs deg Marcello Moores test: Right WNLs deg; Left WNLs deg   POSTURE:  Forward head, rounded shoulder, decreased kyphosis, increased lordosis   PALPATION: TTP midline lower vertebrae, bilat upper gluteal   LUMBAR ROM:    Active  A/PROM  10/21/2021  Flexion Full No pain  Extension Full pressure LB pain mod  Right lateral flexion Full  R LB pain, mod  Left lateral flexion Full L LB pain, min  Right  rotation Full R LB pain, mod  Left rotation Full LB pain, min   (Blank rows = not tested)   LE ROM:                       Grossly WNLs Active  Right 10/21/2021 Left 10/21/2021  Hip flexion      Hip extension      Hip abduction      Hip adduction      Hip internal rotation      Hip external rotation      Knee flexion      Knee extension      Ankle dorsiflexion      Ankle plantarflexion      Ankle inversion      Ankle eversion       (  Blank rows = not tested)   LE MMT:                       Myotomes for legs are equal with no overt weakness, but with decreased strength of the hips. Weak core. MMT Right 10/21/2021 Left 10/21/2021  Hip flexion 4 4  Hip extension 4 4  Hip abduction 4 4  Hip adduction 4 4  Hip internal rotation      Hip external rotation      Knee flexion      Knee extension      Ankle dorsiflexion      Ankle plantarflexion      Ankle inversion      Ankle eversion       (Blank rows = not tested)   LUMBAR SPECIAL TESTS:  Straight leg raise test: Negative and Slump test: Negative   FUNCTIONAL TESTS:  5 times sit to stand: 10/29/21 19.1 sec (hands on thighs) 2 minute walk test: 10/29/21 200 feet (one standing rest break. Frequent staggering due to dizziness).    GAIT: Distance walked: 232f Assistive device utilized: None Level of assistance: Complete Independence Comments: Decreased pace, decreased trunk rotation, valgus bilat legs   TODAY'S TREATMENT   [facility] Adult PT Treatment:                                                DATE: 11/13/21 Therapeutic Exercise: Resting BP- 152/78  Nustep 6779m L5 UE/LE 95% SPO2 Seated ROW 25# x 10 x 2 Seated lat pull down 20 # 10 x 2 Leg press 45# 10 x 2   PPT hand slides on thighs to engage core x 10 for 5 sec x 2 sets Bridge 10 x 2 5" Figure 4 pull 20 sec x 2 each Seated clam blue band 5 sec hold 10 x 2  Seated Marching c core engagement 2 sets of 10    [facility] Adult PT Treatment:                                                 DATE: 11/11/21 Therapeutic Exercise: Resting BP- 140/70  Nustep [id number] UE/LE PPT hand slides on thighs to engage core x 10 for 5 sec x 2  Bridge 10 x 2 5" LTR 10 sec x 6 Marching c core engagement 4 sets of 10 Supine clam blue band 10 x 2  Figure 4 pull x 2 each 20" Seated ROW 20# x 10  Seated lat pull down 20 #    [facility] Adult PT Treatment:                                                DATE: 11/07/21 Therapeutic Exercise: Nustep 79mi42m4 UE/LE PPT hand slides on thighs to engage core x 10 for 5 sec Marching c core engagement 5 sets of 4 marches  LTR x5  5" Figure 4 pull x 2 each 20" Supine clam green band 10 x 2  Ball squeeze 10 x 2 5" Bridge 5 x 2 5" Seated flexion  forward and laterally x2 each 20"  [facility] Adult PT Treatment:                                                DATE: 11/05/21 Therapeutic Exercise:  PPT -mod cues x 10 for 5 sec  LTR x 10  Figure 4- pull x 2 each  Supine clam green band 10 x 2  Ball squeeze 10 x 2 Bridge 5 x 2  SKTC 20 sec x 3 each   [facility] Adult PT Treatment:                                                DATE: 10/29/21 Therapeutic Exercise: 2 MWT 5 x STS STS x 10 hands PPT -mod cues x 15 for sec  LTR x 10 SKTC  Seated childs pose with laterals 3 reps 20 sec each  Nustep L 5 x 5 minutes UE/LE   INITIAL TREATMENT:  - Supine Lower Trunk Rotation  10 reps 5 hold - Supine Posterior Pelvic Tilt  10 reps 3 hold - Hooklying Single Knee to Chest Stretch 3 reps 20 hold - Seated Flexion Stretch with Swiss Ball  6 reps - 20 hold   PATIENT EDUCATION:  Education details: eval findings, POC, HEP Person educated: Patient Education method: Explanation, Demonstration, Tactile cues, Verbal cues, and Handouts Education comprehension: verbalized understanding, returned demonstration, verbal cues required, and tactile cues required     HOME EXERCISE PROGRAM: Access Code: [email or web address] URL: [email or web address] Date:  11/05/2021 Prepared by: Hessie Diener  Exercises - Supine Lower Trunk Rotation  - 2 x daily - 7 x weekly - 1 sets - 10 reps - 5 hold - Supine Posterior Pelvic Tilt  - 2 x daily - 7 x weekly - 2 sets - 10 reps - 5 hold - Hooklying Single Knee to Chest Stretch  - 2 x daily - 7 x weekly - 1 sets - 3 reps - 20 hold - Seated Flexion Stretch with Swiss Ball  - 2 x daily - 7 x weekly - 2 sets - 3 reps - 20 hold - Hooklying Clamshell with Resistance  - 2 x daily - 7 x weekly - 1-2 sets - 10 reps - 5 hold - Supine Hip Adduction Isometric with Ball  - 1 x daily - 7 x weekly - 1-2 sets - 10 reps - Supine Bridge  - 1 x daily - 7 x weekly - 2 sets - 5 reps   ASSESSMENT:   CLINICAL IMPRESSION: Pt reports that she has increased fluid in her body and will need to take a strong water pill today. She was slightly SOB today with vitals stable. Modified to seated therex due to c/o increased SOB in supine. She was able to complete all therex today with modifications. She did report improved tolerance to ambulation 1 hour in [employer] (with cart assist) today without increased LBP. Able to continue with gym machines while monitoring for dizziness and SOB.  Pt is responding appropriately to PT.  Pt will continue to benefit from skilled PT to address limitation to optimize function with less pain.   OBJECTIVE IMPAIRMENTS decreased balance, difficulty walking, decreased strength, dizziness, increased muscle spasms, obesity, and pain.  ACTIVITY LIMITATIONS cleaning, community activity, driving, meal prep, occupation, laundry, and shopping.    PERSONAL FACTORS Fitness, Past/current experiences, Time since onset of injury/illness/exacerbation, and 3+ comorbidities:    Significant hx of heart issues, reports balance issues related to BP dropping with standing, Obesity, OA knees and hips are also affecting patient's functional outcome.    REHAB POTENTIAL: Good   CLINICAL DECISION MAKING: Stable/uncomplicated    EVALUATION COMPLEXITY: Low     GOALS:   SHORT TERM GOALS: Target date: 11/13/2021     Pt will be Ind in an initial HEP Baseline:started on eval Status 11/11/21- performs seated version of HEP Goal status: partially met   2.  Pt will voice understanding of measures to assist with the decrease and management of back pain Baseline:  Goal status: ONGOING     LONG TERM GOALS: Target date: 12/12/21   Increase pt's core strength and hip strength to 4+/5  for improved function Baseline: 4/5 Goal status: INITIAL   2.  Pt will report a decrease in back pain to 6/10 or less intermittently with daily activities Baseline: 6-10/10 Goal status: INITIAL   3.  Pt will be able to tolerate walking for 20 mins before needing to sitt down  Baseline: 7 min Goal status: INITIAL   4.  Pt will improve her 5xSTS time by the MCID of 4 sec as indication of improved function Baseline: TBA Goal status: INITIAL   5.  Pt will improve her 2MWT distance by the MCID of 23f as indication of improved function Baseline: TBA Goal status: INITIAL   6.  Pt will be Ind in a final HEP to maintain achieved LOF Baseline: started on eval Goal status: INITIAL   7.  Pt's Mod Oswestry score will improve to 28% as indication of improved pain and function Baseline: started on eval Goal status: INITIAL     PLAN: PT FREQUENCY: 2x/week   PT DURATION: 6 weeks   PLANNED INTERVENTIONS: Therapeutic exercises, Therapeutic activity, Balance training, Gait training, Patient/Family education, Joint mobilization, Stair training, Aquatic Therapy, Dry Needling, Electrical stimulation, Spinal manipulation, Spinal mobilization, Cryotherapy, Moist heat, Taping, Traction, Ultrasound, Ionotophoresis 497mml Dexamethasone, and Manual therapy.   PLAN FOR NEXT SESSION: assess response to HEP, progress as able ,check BP at start of session.  pt has dizziness in standing (BP drops)  - has abdominal binder she plans to wear to assist in  BP . (No restrictions per cardiologists) . Recheck strength or ROM    JeHessie DienerPTA 11/13/21 8:43 AM Phone: [phone]: [phone]

## 2021-11-14 ENCOUNTER — Telehealth: Payer: Self-pay | Admitting: Cardiovascular Disease

## 2021-11-14 ENCOUNTER — Other Ambulatory Visit: Payer: Self-pay

## 2021-11-14 DIAGNOSIS — N1832 Chronic kidney disease, stage 3b: Secondary | ICD-10-CM

## 2021-11-14 DIAGNOSIS — E1142 Type 2 diabetes mellitus with diabetic polyneuropathy: Secondary | ICD-10-CM

## 2021-11-14 DIAGNOSIS — E1122 Type 2 diabetes mellitus with diabetic chronic kidney disease: Secondary | ICD-10-CM

## 2021-11-14 DIAGNOSIS — E113513 Type 2 diabetes mellitus with proliferative diabetic retinopathy with macular edema, bilateral: Secondary | ICD-10-CM

## 2021-11-14 MED ORDER — OZEMPIC (0.25 OR 0.5 MG/DOSE) 2 MG/3ML ~~LOC~~ SOPN: PEN_INJECTOR | SUBCUTANEOUS | 0 refills | Status: DC

## 2021-11-14 NOTE — Telephone Encounter (Signed)
Patient returned call and verbalized that she did 4 weeks of 0.25mg  dosing and 1 week of 0.5 dosing and then was out. Refill sent into to finish 3 remaining weeks of 0.5 dosing with instructions to call before moving to next dose.

## 2021-11-14 NOTE — Addendum Note (Signed)
Addended by: Gerald Stabs on: 11/14/2021 01:30 PM   Modules accepted: Orders

## 2021-11-14 NOTE — Telephone Encounter (Signed)
Pt c/o medication issue:  1. Name of Medication: Semaglutide,0.25 or 0.5MG /DOS, (OZEMPIC, 0.25 OR 0.5 MG/DOSE,) 2 MG/3ML SOPN  2. How are you currently taking this medication (dosage and times per day)? Inject 0.5mg  under the skin once weekly  3. Are you having a reaction (difficulty breathing--STAT)? No   4. What is your medication issue? Patient needs a new prescription sent in to  [id number] - [place], [place]

## 2021-11-14 NOTE — Telephone Encounter (Signed)
Left message for patient to call back  

## 2021-11-17 DIAGNOSIS — Z794 Long term (current) use of insulin: Secondary | ICD-10-CM | POA: Diagnosis not present

## 2021-11-17 DIAGNOSIS — E1121 Type 2 diabetes mellitus with diabetic nephropathy: Secondary | ICD-10-CM | POA: Diagnosis not present

## 2021-11-18 ENCOUNTER — Telehealth: Payer: Self-pay | Admitting: Cardiovascular Disease

## 2021-11-18 ENCOUNTER — Ambulatory Visit: Payer: Medicaid Other

## 2021-11-18 DIAGNOSIS — M5459 Other low back pain: Secondary | ICD-10-CM | POA: Diagnosis not present

## 2021-11-18 DIAGNOSIS — E1165 Type 2 diabetes mellitus with hyperglycemia: Secondary | ICD-10-CM | POA: Diagnosis not present

## 2021-11-18 DIAGNOSIS — R262 Difficulty in walking, not elsewhere classified: Secondary | ICD-10-CM | POA: Diagnosis not present

## 2021-11-18 DIAGNOSIS — M62830 Muscle spasm of back: Secondary | ICD-10-CM | POA: Diagnosis not present

## 2021-11-18 NOTE — Telephone Encounter (Signed)
Pt c/o medication issue:  1. Name of Medication: Semaglutide,0.25 or 0.5MG /DOS, (OZEMPIC, 0.25 OR 0.5 MG/DOSE,) 2 MG/3ML SOPN  2. How are you currently taking this medication (dosage and times per day)? Inject once a week  3. Are you having a reaction (difficulty breathing--STAT)? no  4. What is your medication issue? Patient states the medication requires a prior authorization

## 2021-11-18 NOTE — Therapy (Signed)
OUTPATIENT PHYSICAL THERAPY TREATMENT NOTE   Patient Name: Maria Hudson MRN: [id number] DOB:1961/06/01, 61 y.o., female Today's Date: 11/18/2021 PCP: Nolene Ebbs, MD   REFERRING PROVIDER: Gentry Fitz, MD  END OF SESSION:   PT End of Session - 11/18/21 0807     Visit Number 7    Number of Visits 13    Date for PT Re-Evaluation 12/12/21    Authorization Type [place] MEDICAID [facility]    Authorization Time Period 10/23/21-12/22/21    Authorization - Visit Number 6    Authorization - Number of Visits 10    Progress Note Due on Visit 10    PT Start Time 0804    PT Stop Time 0845    PT Time Calculation (min) 41 min    Activity Tolerance Patient tolerated treatment well    Behavior During Therapy [facility] for tasks assessed/performed               Past Medical History:  Diagnosis Date   Acute on chronic diastolic heart failure ([place]) 03/05/2021   Anemia    Arthritis    CAD (coronary artery disease)    CHF (congestive heart failure) (HCC)    CKD (chronic kidney disease) stage 4, GFR 15-29 ml/min (HCC) 03/05/2021   Colon polyps    CVA (cerebral vascular accident) ([place])    Depression    Diabetes mellitus without complication ([place])    Hypertension    Hypertensive urgency 04/14/2021   OSA (obstructive sleep apnea) 05/27/2021   Pneumonia    Pure hypercholesterolemia 05/27/2021   Renal disorder    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   Sleep apnea    Stroke [facility])    Past Surgical History:  Procedure Laterality Date   ABDOMINAL HYSTERECTOMY     BIOPSY  09/08/2021   Procedure: BIOPSY;  Surgeon: Doran Stabler, MD;  Location: [facility];  Service: Gastroenterology;;   CESAREAN SECTION     ESOPHAGOGASTRODUODENOSCOPY (EGD) WITH PROPOFOL N/A 09/08/2021   Procedure: ESOPHAGOGASTRODUODENOSCOPY (EGD) WITH PROPOFOL;  Surgeon: Doran Stabler, MD;  Location: [facility];  Service: Gastroenterology;  Laterality: N/A;  chest pain, epigastric pain   INNER EAR SURGERY  Bilateral    RIGHT HEART CATH N/A 05/14/2021   Procedure: RIGHT HEART CATH;  Surgeon: Larey Dresser, MD;  Location: [place] [facility];  Service: Cardiovascular;  Laterality: N/A;   TONSILLECTOMY     age 67   Patient Active Problem List   Diagnosis Date Noted   Pure hypercholesterolemia 05/27/2021   Chronic diastolic CHF (congestive heart failure) ([place]) 04/29/2021   Hypertensive urgency 04/14/2021   Acute on chronic diastolic CHF (congestive heart failure) ([place]) 04/14/2021   Chest pain 03/05/2021   CAD (coronary artery disease) 03/05/2021   T2DM (type 2 diabetes mellitus) ([place]) 03/05/2021   CKD (chronic kidney disease) stage 4, GFR 15-29 ml/min (HCC) 03/05/2021   Class 3 obesity ([place]) 03/05/2021   CVA (cerebral vascular accident) ([place]) 03/05/2021   Acute on chronic diastolic heart failure ([place]) 03/05/2021   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   AKI (acute kidney injury) ([place]) 03/05/2021   Vitreous hemorrhage of left eye ([place]) 11/14/2020   Orthopnea 08/06/2020   Vision loss of right eye 08/06/2020   Excessive daytime sleepiness 01/11/2020   Iron deficiency anemia 12/14/2019   Vitreous hemorrhage, right ([place]) 12/04/2019   Diabetic neuropathy ([place]) 07/26/2019   Proliferative diabetic retinopathy of both 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 ([place]) 02/03/2019   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   Conductive hearing loss, bilateral 03/03/2018   Chronic bilateral low back pain without sciatica 08/21/2015   Chronic kidney disease (CKD) stage G3b/A3, moderately decreased glomerular filtration rate (GFR) between 30-44 mL/min/1.73 square meter and albuminuria creatinine ratio greater than 300 mg/g ([place]) 04/30/2015   Type 2 diabetes mellitus with diabetic polyneuropathy, with long-term current use of insulin ([place]) 02/15/2015   Advance directive on file 05/17/2014   Refusal of blood transfusions as patient is Jehovah's Witness 05/17/2014   OSA treated with BiPAP  02/23/2014   Coronary artery disease 07/07/2013   Severe obesity (BMI >= 40) ([place]) 05/18/2013   Neuropathy 69/48/5462   Diastolic heart failure ([place]) 10/16/2012   Essential hypertension 10/16/2012   REFERRING DIAG: Low back pain and SI dysfunction   THERAPY DIAG:  Other low back pain  Muscle spasm of back  Difficulty in walking, not elsewhere classified  PERTINENT HISTORY: Significant hx of heart issues, reports balance issues related to BP dropping with standing, Obesity, OA knees and hips   PRECAUTIONS: None  SUBJECTIVE: Pt reports she is able to tolerate walking longer distances. Pt reports her body fluid is about the same as her last visit.  PAIN:  Are you having pain? Yes: NPRS scale: 5/10 Pain location: low back Pain description: ache, throb Aggravating factors: Prolonged sitting, standing, and walking Relieving factors: Rest, pain meds as needed           6-9/10 pain range   OBJECTIVE: (objective measures completed at initial evaluation unless otherwise dated)   DIAGNOSTIC FINDINGS:  Not available in EPIC   PATIENT SURVEYS:  Modified Oswestry 40%    SCREENING FOR RED FLAGS: Bowel or bladder incontinence: No Cauda equina syndrome: No   COGNITION:           Overall cognitive status: Within functional limits for tasks assessed                          SENSATION: WFL   MUSCLE LENGTH: Hamstrings: Right WNLs deg; Left WNLs deg Marcello Moores test: Right WNLs deg; Left WNLs deg   POSTURE:  Forward head, rounded shoulder, decreased kyphosis, increased lordosis   PALPATION: TTP midline lower vertebrae, bilat upper gluteal   LUMBAR ROM:    Active  A/PROM  10/21/2021  Flexion Full No pain  Extension Full pressure LB pain mod  Right lateral flexion Full  R LB pain, mod  Left lateral flexion Full L LB pain, min  Right rotation Full R LB pain, mod  Left rotation Full LB pain, min   (Blank rows = not tested)   LE ROM:                       Grossly WNLs Active   Right 10/21/2021 Left 10/21/2021  Hip flexion      Hip extension      Hip abduction      Hip adduction      Hip internal rotation      Hip external rotation      Knee flexion      Knee extension      Ankle dorsiflexion      Ankle plantarflexion      Ankle inversion      Ankle eversion       (Blank rows = not tested)   LE MMT:  Myotomes for legs are equal with no overt weakness, but with decreased strength of the hips. Weak core. MMT Right 10/21/2021 Left 10/21/2021 Rt 11/18/21 LT 11/18/21  Hip flexion '4 4 4 4  ' Hip extension '4 4 4 4  ' Hip abduction '4 4 4 4  ' Hip adduction 4 4 4+ 4+  Hip internal rotation        Hip external rotation        Knee flexion        Knee extension        Ankle dorsiflexion        Ankle plantarflexion        Ankle inversion        Ankle eversion         (Blank rows = not tested)   LUMBAR SPECIAL TESTS:  Straight leg raise test: Negative and Slump test: Negative   FUNCTIONAL TESTS:  5 times sit to stand: 10/29/21 19.1 sec (hands on thighs) 2 minute walk test: 10/29/21 200 feet (one standing rest break. Frequent staggering due to dizziness).    GAIT: Distance walked: 257f Assistive device utilized: None Level of assistance: Complete Independence Comments: Decreased pace, decreased trunk rotation, valgus bilat legs   TODAY'S TREATMENT  [facility] Adult PT Treatment:                                                DATE: 11/18/21 Therapeutic Exercise: Nustep 668m L6 UE/LE 96% SPO2. Pt reports light DOE. Trunk flexion c swiss ball forward and laterally x2 each 20" PPT hand slides on thighs to engage core x 10 for 5 sec x 2 sets Marching c PPT 2x10 Bridge 2x10 5" Hip clams 2x10 PPT hand slides on thighs to engage core x 10 for 5 sec x 2 sets Bridge 10 x 2 5" Seated ROW 35# x 10 x 2 Seated lat pull down 20 # 10 x 2 Leg press 45# 10 x 2  MMT both LEs  [facility] Adult PT Treatment:                                                DATE:  11/13/21 Therapeutic Exercise: Resting BP- 152/78  Nustep 61m70mL5 UE/LE 95% SPO2 Seated ROW 25# x 10 x 2 Seated lat pull down 20 # 10 x 2 Leg press 45# 10 x 2   PPT hand slides on thighs to engage core x 10 for 5 sec x 2 sets Bridge 10 x 2 5" Figure 4 pull 20 sec x 2 each Seated clam blue band 5 sec hold 10 x 2  Seated Marching c core engagement 2 sets of 10    [facility] Adult PT Treatment:                                                DATE: 11/11/21 Therapeutic Exercise: Resting BP- 140/70  Nustep 5mi68m5 UE/LE PPT hand slides on thighs to engage core x 10 for 5 sec x 2  Bridge 10 x 2 5" LTR 10 sec x 6 Marching c core engagement 4 sets  of 10 Supine clam blue band 10 x 2  Figure 4 pull x 2 each 20" Seated ROW 20# x 10  Seated lat pull down 20 #   PATIENT EDUCATION:  Education details: eval findings, POC, HEP Person educated: Patient Education method: Explanation, Demonstration, Tactile cues, Verbal cues, and Handouts Education comprehension: verbalized understanding, returned demonstration, verbal cues required, and tactile cues required     HOME EXERCISE PROGRAM: Access Code: [email or web address] URL: [email or web address] Date: 11/05/2021 Prepared by: Hessie Diener  Exercises - Supine Lower Trunk Rotation  - 2 x daily - 7 x weekly - 1 sets - 10 reps - 5 hold - Supine Posterior Pelvic Tilt  - 2 x daily - 7 x weekly - 2 sets - 10 reps - 5 hold - Hooklying Single Knee to Chest Stretch  - 2 x daily - 7 x weekly - 1 sets - 3 reps - 20 hold - Seated Flexion Stretch with Swiss Ball  - 2 x daily - 7 x weekly - 2 sets - 3 reps - 20 hold - Hooklying Clamshell with Resistance  - 2 x daily - 7 x weekly - 1-2 sets - 10 reps - 5 hold - Supine Hip Adduction Isometric with Ball  - 1 x daily - 7 x weekly - 1-2 sets - 10 reps - Supine Bridge  - 1 x daily - 7 x weekly - 2 sets - 5 reps   ASSESSMENT:   CLINICAL IMPRESSION: Pt completed prescribed therex without adverse effects. Pt  experienced no issues with breathing during therex. PT was completed to lumbopelvic flexibility. Hip strength was reassess c improvement found for hip ER and add. Discussed TPDN with pt wanting to try the next PT session.   OBJECTIVE IMPAIRMENTS decreased balance, difficulty walking, decreased strength, dizziness, increased muscle spasms, obesity, and pain.    ACTIVITY LIMITATIONS cleaning, community activity, driving, meal prep, occupation, laundry, and shopping.    PERSONAL FACTORS Fitness, Past/current experiences, Time since onset of injury/illness/exacerbation, and 3+ comorbidities:    Significant hx of heart issues, reports balance issues related to BP dropping with standing, Obesity, OA knees and hips are also affecting patient's functional outcome.    REHAB POTENTIAL: Good   CLINICAL DECISION MAKING: Stable/uncomplicated   EVALUATION COMPLEXITY: Low     GOALS:   SHORT TERM GOALS: Target date: 11/13/2021     Pt will be Ind in an initial HEP Baseline:started on eval Status 11/11/21- performs seated version of HEP Goal status: partially met   2.  Pt will voice understanding of measures to assist with the decrease and management of back pain Baseline:  Goal status: ONGOING     LONG TERM GOALS: Target date: 12/12/21   Increase pt's core strength and hip strength to 4+/5  for improved function. 11/18/21: add and ER improved. See flow sheets Baseline: 4/5 Goal status: partially met   2.  Pt will report a decrease in back pain to 6/10 or less intermittently with daily activities Baseline: 6-10/10 Goal status: INITIAL   3.  Pt will be able to tolerate walking for 20 mins before needing to sitt down  Baseline: 7 min Goal status: INITIAL   4.  Pt will improve her 5xSTS time by the MCID of 4 sec as indication of improved function Baseline: TBA Goal status: INITIAL   5.  Pt will improve her 2MWT distance by the MCID of 49f as indication of improved function Baseline: TBA Goal  status: INITIAL   6.  Pt will be Ind in a final HEP to maintain achieved LOF Baseline: started on eval Goal status: INITIAL   7.  Pt's Mod Oswestry score will improve to 28% as indication of improved pain and function Baseline: started on eval Goal status: INITIAL     PLAN: PT FREQUENCY: 2x/week   PT DURATION: 6 weeks   PLANNED INTERVENTIONS: Therapeutic exercises, Therapeutic activity, Balance training, Gait training, Patient/Family education, Joint mobilization, Stair training, Aquatic Therapy, Dry Needling, Electrical stimulation, Spinal manipulation, Spinal mobilization, Cryotherapy, Moist heat, Taping, Traction, Ultrasound, Ionotophoresis 51m/ml Dexamethasone, and Manual therapy.   PLAN FOR NEXT SESSION: assess response to HEP, progress as able ,check BP at start of session.  pt has dizziness in standing (BP drops)  - has abdominal binder she plans to wear to assist in BP . (No restrictions per cardiologists) . Recheck trunk movements. ROM and %xSTS. Discussed possible completion of TPDM to low back.  Epifanio Labrador MS, PT 11/18/21 8:57 AM

## 2021-11-18 NOTE — Progress Notes (Signed)
PCP: Nolene Ebbs, MD HF Cardiology: Dr. Aundra Dubin  61 y.o. with history of supine hypertension with orthostatic hypotension, CKD stage 4, CAD, and chronic diastolic CHF was referred from [facility] clinic for evaluation of CHF.    Patient had MI in 05/2013. LHC showed 20% LAD, 50 to 60% left circumflex, and 100% RPDA stenosis with left-to-right collaterals. CAD managed medically.    She reports a long h/o diastolic heart failure and frequent hospitalizations. She moved to [place] from [facility], had been followed by [facility]. Has struggled w/ volume control and readjustment of diuretics over the years. She reports previously being prescribed Wilder Glade but had to stop b/c her insurance would not cover it. Her last echo at [facility] was 7/21. LVEF was 55%, RV normal. RVSP elevated at 60 mmHg. PYP at [facility] was negative for amyloid (grade 1, H/CL 1.21). Serum IFE not suggestive of monoclonal gammopathy. Urine IFE and immunoglobin free light chains were elevated (lg FLCK 9.24, Ig FLCL 3.75, K/L ratio 2.46. Repeat testing recommended 6 months later, but do not see that this was completed).    Referred to HTN clinic 9/22 for uncontrolled HTN. Now on multidrug regimen. Thyroid function, renin, and aldosterone were normal, as were catecholamines and metanephrines. Her PTH was elevated at 134 but calcium was normal. Renal artery doppler study was limited. There was no evidence of right RAS however the left renal artery was not well visualized due to the presence of bowel gas.  Echo was repeated 9/22 showing normal LVEF 55-60%, G1DD and normal RV.    She has struggled w/ fluid management recently, c/b stage IV CKD. Baseline SCr ~2.3.  Followed by nephrology. Previously on Bumex and metolazone. She was admitted 24/40 for a/c diastolic CHF and diuresed w/ IV Lasix. Did not get RHC. Transitioned back to PO diuretics, Torsemide 40 mg once daily + spiro 25 mg daily. D/w Wt 231 lb. Referred to [facility] clinic.   She was admitted again in  11/22 with chest pain, AKI, and orthostatic hypotension.  Torsemide was stopped.  HS-TnI was negative, suspect musculoskeletal chest pain.    PYP study repeated in 12/22 was not suggestive of cardiac amyloidosis.   She returns for evaluation of CHF.  She recently tripped, fell, and fractured her right knee cap.  She is wearing a brace to walk.  She also has gout in her left knee currently and is on prednisone.  She is not very active due to the fracture and gout.  She comes in in a wheelchair today.  Weight is up 7 lbs.  She feels like the current dose of torsemide is adequate, minimal edema.  She is short of breath if she tries to walk a long distance, but, as above, not walking much with patellar fracture and gout. BP continues to run high.   Labs (11/22): K 3.8, creatinine 3.6 => 2.62 Labs (2/23): K 3.7, creatinine 3.31   PMH: 1. HTN: Urine catecholamines and metanephrines normal level, aldosterone normal level.  Complicated by orthostatic hypotension.   - Renal artery dopplers (11/22): No right renal artery stenosis, unable to visualize left renal artery due to overlying bowel gas.  2. Orthostatic hypotension 3. CKD stage 4 4. Type 2 diabetes 5. OSA: Uses CPAP 6. CAD: LHC in 2014 with totally occluded RCA with collaterals, medical management.  7. Chronic diastolic CHF: Echo (1/02) with EF 55%, normal RV, PASP 60 mmHg.  - PYP scan 11/20 at [facility] was negative with grade 1, H/CL 1.21.  -  Echo (9/22): EF 55-60%, mild LVH, normal RV, IVC normal.  - RHC (11/22): mean RA 1, PA 41/10 mean 24, mean PCWP 12, CI 3.  - PYP scan (12/22): grade 1, H/CL 1.  8. Gout 9. COVID-19 2/23  Social History   Socioeconomic History   Marital status: Divorced    Spouse name: Not on file   Number of children: 2   Years of education: Not on file   Highest education level: Some college, no degree  Occupational History   Occupation: retired   Occupation: disabled  Tobacco Use   Smoking status: Never     Passive exposure: Never   Smokeless tobacco: Never  Vaping Use   Vaping Use: Never used  Substance and Sexual Activity   Alcohol use: Not Currently   Drug use: Not Currently   Sexual activity: Not on file  Other Topics Concern   Not on file  Social History Narrative   Not on file   Social Determinants of Health   Financial Resource Strain: Low Risk    Difficulty of Paying Living Expenses: Not very hard  Food Insecurity: No Food Insecurity   Worried About Charity fundraiser in the Last Year: Never true   [place] in the Last Year: Never true  Transportation Needs: No Transportation Needs   Lack of Transportation (Medical): No   Lack of Transportation (Non-Medical): No  Physical Activity: Inactive   Days of Exercise per Week: 0 days   Minutes of Exercise per Session: 0 min  Stress: Stress Concern Present   Feeling of Stress : Very much  Social Connections: Moderately Isolated   Frequency of Communication with Friends and Family: More than three times a week   Frequency of Social Gatherings with Friends and Family: More than three times a week   Attends Religious Services: More than 4 times per year   Active Member of [employer] or Organizations: No   Attends Music therapist: Never   Marital Status: Divorced  Human resources officer Violence: Not At Risk   Fear of Current or Ex-Partner: No   Emotionally Abused: No   Physically Abused: No   Sexually Abused: No   Family History  Problem Relation Age of Onset   Hypertension Mother    Stroke Mother    Diabetes Mother    Other Mother        enlarged heart   Hypertension Father    Hypertension Sister    Diabetes Sister    CAD Sister        enlarged heart   Kidney disease Sister    Diabetes Paternal Grandmother    Hypertension Daughter    Diabetes Daughter    Hypertension Son    Diabetes Son    Breast cancer Paternal Aunt    ROS: All systems reviewed and negative except as per HPI.   Current Outpatient  Medications  Medication Sig Dispense Refill   Accu-Chek Softclix Lancets lancets 3 (three) times daily.     acetaminophen (TYLENOL) 500 MG tablet Take 1,000 mg by mouth every 6 (six) hours as needed for moderate pain or headache.     allopurinol (ZYLOPRIM) 100 MG tablet Take 100 mg by mouth in the morning.     aspirin EC 81 MG tablet Take 81 mg by mouth in the morning. Swallow whole.     atorvastatin (LIPITOR) 40 MG tablet Take 1 tablet (40 mg total) by mouth every evening. 90 tablet 3   buPROPion (  WELLBUTRIN) 100 MG tablet Take 100 mg by mouth 2 (two) times daily.     carvedilol (COREG) 25 MG tablet Take 1 tablet (25 mg total) by mouth 2 (two) times daily with a meal. 60 tablet 0   cholecalciferol (VITAMIN D3) 25 MCG (1000 UNIT) tablet Take 1,000 Units by mouth daily.     cloNIDine (CATAPRES - DOSED IN MG/24 HR) 0.3 mg/24hr patch Place 1 patch (0.3 mg total) onto the skin once a week. (Patient taking differently: Place 0.3 mg onto the skin every Thursday.) 4 patch 12   diclofenac Sodium (VOLTAREN) 1 % GEL Apply 2 g topically 4 (four) times daily as needed (chest pain.).     doxycycline (VIBRAMYCIN) 100 MG capsule Take 1 capsule (100 mg total) by mouth 2 (two) times daily. 14 capsule 0   ferrous sulfate 325 (65 FE) MG tablet Take 325 mg by mouth 2 (two) times daily.     fluticasone (FLONASE) 50 MCG/ACT nasal spray Place 1 spray into both nostrils daily as needed for allergies or rhinitis.     gabapentin (NEURONTIN) 100 MG capsule Take 100-300 mg by mouth at bedtime.     hydrALAZINE (APRESOLINE) 100 MG tablet Take 100 mg by mouth 3 (three) times daily.     insulin glargine (LANTUS) 100 unit/mL SOPN Inject 10 Units into the skin at bedtime.     isosorbide mononitrate (IMDUR) 60 MG 24 hr tablet Take 1.5 tablets (90 mg total) by mouth daily. (Patient taking differently: Take 90 mg by mouth every evening.) 135 tablet 3   loratadine (CLARITIN) 10 MG tablet Take 10 mg by mouth in the morning.      Multiple Vitamin (MULTIVITAMIN ADULT) TABS Take 1 tablet by mouth in the morning.     NIFEdipine (ADALAT CC) 60 MG 24 hr tablet Take 2 tablets (120 mg total) by mouth daily. 60 tablet 11   nitroGLYCERIN (NITROSTAT) 0.4 MG SL tablet Place 1 tablet (0.4 mg total) under the tongue every 5 (five) minutes as needed for chest pain. 20 tablet 0   NOVOLOG FLEXPEN 100 UNIT/ML FlexPen Inject 8 Units into the skin 3 (three) times daily with meals.     Omega-3 Fatty Acids (FISH OIL) 1000 MG CAPS Take 1,000 mg by mouth in the morning.     oxyCODONE-acetaminophen (PERCOCET/ROXICET) 5-325 MG tablet Take 1 tablet by mouth 3 (three) times daily as needed for severe pain.     pantoprazole (PROTONIX) 40 MG tablet Take 40 mg by mouth daily as needed (upset stomach.).     pregabalin (LYRICA) 25 MG capsule Take 25 mg by mouth 2 (two) times daily.     Semaglutide,0.25 or 0.5MG /DOS, (OZEMPIC, 0.25 OR 0.5 MG/DOSE,) 2 MG/3ML SOPN Inject 0.5mg  under the skin once weekly, please contact the office when you have completed the three remaining doses of 0.5 to move to the next dose. 1 mL 0   sertraline (ZOLOFT) 50 MG tablet Take 50 mg by mouth in the morning.     sodium bicarbonate 650 MG tablet Take 650 mg by mouth 2 (two) times daily.     spironolactone (ALDACTONE) 25 MG tablet Take 25 mg by mouth daily.     torsemide (DEMADEX) 20 MG tablet Take 1 tablet (20 mg total) by mouth every other day. Patient takes 1 tablet by mouth every other day as needed for fluid retention. 15 tablet 3   traZODone (DESYREL) 50 MG tablet Take 50 mg by mouth at bedtime.  No current facility-administered medications for this visit.   There were no vitals taken for this visit. General: NAD, obese.  Neck: No JVD, no thyromegaly or thyroid nodule.  Lungs: Clear to auscultation bilaterally with normal respiratory effort. CV: Nondisplaced PMI.  Heart regular S1/S2, no S3/S4, 1/6 SEM RUSB.  Trace ankle edema.  No carotid bruit.  Normal pedal pulses.   Abdomen: Soft, nontender, no hepatosplenomegaly, no distention.  Skin: Intact without lesions or rashes.  Neurologic: Alert and oriented x 3.  Psych: Normal affect. Extremities: No clubbing or cyanosis.  HEENT: Normal.   Assessment/Plan: 1. Supine hypertension with orthostatic hypotension: This is difficult to manage.  Resting BP remains high today, she denies orthostatic symptoms currently though not on her feet much with patellar fracture.  Workup for secondary hypertension has been negative.  Testing has been negative for amyloidosis (concerned for autonomic neuropathy with orthostatic hypotension).  - Continue Coreg 25 mg bid, clonidine at current dose, chlorthalidone, and hydralazine 100 tid.   - Stop amlodipine, start nifedipine CR 120 mg daily.  - Off spironolactone with AKI.  2. Chronic diastolic CHF: Echo in 1/03 with EF 55-60%, normal RV. RHC in 11/22 with optimized filling pressures.  PYP scan, myeloma studies were not suggestive of cardiac amyloidosis. She has struggled with volume overload in setting of CKD stage IV.  She is not volume overloaded on exam today though weight is up (likely caloric intake with minimal activity).  - Continue torsemide 20 mg every other day, check BMET today.   - With Medicaid, unable to get Cardiomems.  3. CKD stage IV: Check BMET today.  4. CAD: H/o occluded PDA with collaterals.  No chest pain.  - Continue ASA 81 daily.  - Continue atorvastatin, check lipids at next appt.  5. Obesity: I will refer to pharmacy clinic to see if she can get semaglutide for weight loss.   Followup 3 months with APP.   [place] 11/18/2021

## 2021-11-18 NOTE — Telephone Encounter (Signed)
Hey this patient was on Ozempic and we sent in refills but now they are asking for a new prior auth? Help please?

## 2021-11-19 ENCOUNTER — Ambulatory Visit (HOSPITAL_COMMUNITY)
Admission: RE | Admit: 2021-11-19 | Discharge: 2021-11-19 | Disposition: A | Discharge status: Home / Self Care | Payer: Medicaid Other | Source: Ambulatory Visit | Attending: Family Medicine | Admitting: Family Medicine

## 2021-11-19 ENCOUNTER — Encounter: Payer: Self-pay | Admitting: Cardiovascular Disease

## 2021-11-19 ENCOUNTER — Encounter (HOSPITAL_COMMUNITY): Payer: Self-pay

## 2021-11-19 ENCOUNTER — Ambulatory Visit (INDEPENDENT_AMBULATORY_CARE_PROVIDER_SITE_OTHER): Payer: Medicaid Other | Admitting: Cardiovascular Disease

## 2021-11-19 VITALS — BP 130/60 | HR 70 | Wt 225.0 lb

## 2021-11-19 DIAGNOSIS — Z79899 Other long term (current) drug therapy: Secondary | ICD-10-CM | POA: Insufficient documentation

## 2021-11-19 DIAGNOSIS — I10 Essential (primary) hypertension: Secondary | ICD-10-CM | POA: Diagnosis not present

## 2021-11-19 DIAGNOSIS — I2510 Atherosclerotic heart disease of native coronary artery without angina pectoris: Secondary | ICD-10-CM | POA: Diagnosis not present

## 2021-11-19 DIAGNOSIS — I2583 Coronary atherosclerosis due to lipid rich plaque: Secondary | ICD-10-CM | POA: Diagnosis not present

## 2021-11-19 DIAGNOSIS — I5032 Chronic diastolic (congestive) heart failure: Secondary | ICD-10-CM | POA: Diagnosis not present

## 2021-11-19 DIAGNOSIS — E669 Obesity, unspecified: Secondary | ICD-10-CM | POA: Insufficient documentation

## 2021-11-19 DIAGNOSIS — Z7901 Long term (current) use of anticoagulants: Secondary | ICD-10-CM | POA: Diagnosis not present

## 2021-11-19 DIAGNOSIS — I951 Orthostatic hypotension: Secondary | ICD-10-CM | POA: Diagnosis not present

## 2021-11-19 DIAGNOSIS — N184 Chronic kidney disease, stage 4 (severe): Secondary | ICD-10-CM

## 2021-11-19 DIAGNOSIS — I130 Hypertensive heart and chronic kidney disease with heart failure and stage 1 through stage 4 chronic kidney disease, or unspecified chronic kidney disease: Secondary | ICD-10-CM | POA: Insufficient documentation

## 2021-11-19 DIAGNOSIS — Z9989 Dependence on other enabling machines and devices: Secondary | ICD-10-CM | POA: Insufficient documentation

## 2021-11-19 DIAGNOSIS — I252 Old myocardial infarction: Secondary | ICD-10-CM | POA: Diagnosis not present

## 2021-11-19 DIAGNOSIS — G4719 Other hypersomnia: Secondary | ICD-10-CM | POA: Diagnosis not present

## 2021-11-19 DIAGNOSIS — G4733 Obstructive sleep apnea (adult) (pediatric): Secondary | ICD-10-CM

## 2021-11-19 DIAGNOSIS — Z6839 Body mass index (BMI) 39.0-39.9, adult: Secondary | ICD-10-CM | POA: Diagnosis not present

## 2021-11-19 DIAGNOSIS — Z7902 Long term (current) use of antithrombotics/antiplatelets: Secondary | ICD-10-CM | POA: Insufficient documentation

## 2021-11-19 DIAGNOSIS — E1142 Type 2 diabetes mellitus with diabetic polyneuropathy: Secondary | ICD-10-CM

## 2021-11-19 DIAGNOSIS — Z7982 Long term (current) use of aspirin: Secondary | ICD-10-CM | POA: Diagnosis not present

## 2021-11-19 DIAGNOSIS — Z794 Long term (current) use of insulin: Secondary | ICD-10-CM

## 2021-11-19 LAB — BASIC METABOLIC PANEL WITH GFR
Anion gap: 9 (ref 5–15)
BUN: 54 mg/dL — ABNORMAL HIGH (ref 6–20)
CO2: 24 mmol/L (ref 22–32)
Calcium: 9.1 mg/dL (ref 8.9–10.3)
Chloride: 107 mmol/L (ref 98–111)
Creatinine, Ser: 2.89 mg/dL — ABNORMAL HIGH (ref 0.44–1.00)
GFR, Estimated: 18 mL/min — ABNORMAL LOW
Glucose, Bld: 165 mg/dL — ABNORMAL HIGH (ref 70–99)
Potassium: 4.7 mmol/L (ref 3.5–5.1)
Sodium: 140 mmol/L (ref 135–145)

## 2021-11-19 LAB — BRAIN NATRIURETIC PEPTIDE: B Natriuretic Peptide: 114.2 pg/mL — ABNORMAL HIGH (ref 0.0–100.0)

## 2021-11-19 MED ORDER — TORSEMIDE 20 MG PO TABS: ORAL_TABLET | ORAL | 3 refills | Status: DC

## 2021-11-19 MED ORDER — POTASSIUM CHLORIDE CRYS ER 20 MEQ PO TBCR
20.0000 meq | EXTENDED_RELEASE_TABLET | Freq: Every day | ORAL | 3 refills | Status: DC
Start: 2021-11-19 — End: 2022-03-03

## 2021-11-19 NOTE — Patient Instructions (Addendum)
INCREASE Torsemide to 40 mg daily for 3 days, then resume normal dose of 20 mg daily there after START Potassium 20 meq daily for 3 days then stop  Labs today We will only contact you if something comes back abnormal or we need to make some changes. Otherwise no news is good news!  Labs needed in 10-14 days  Your physician recommends that you schedule a follow-up appointment in: 6 weeks  in the Advanced Practitioners (PA/NP) Clinic and in 3 months with Dr Aundra Dubin  Do the following things EVERYDAY: Weigh yourself in the morning before breakfast. Write it down and keep it in a log. Take your medicines as prescribed Eat low salt foods--Limit salt (sodium) to 2000 mg per day.  Stay as active as you can everyday Limit all fluids for the day to less than 2 liters  At the [facility], you and your health needs are our priority. As part of our continuing mission to provide you with exceptional heart care, we have created designated Provider Care Teams. These Care Teams include your primary Cardiologist (physician) and Advanced Practice Providers (APPs- Physician Assistants and Nurse Practitioners) who all work together to provide you with the care you need, when you need it.   You may see any of the following providers on your designated Care Team at your next follow up: Dr Glori Bickers Dr Haynes Kerns, NP Lyda Jester, [place] [facility] [place], [place] Audry Riles, PharmD   Please be sure to bring in all your medications bottles to every appointment.   If you have any questions or concerns before your next appointment please send [place] a message through [place] or call our office at [phone].    TO LEAVE A MESSAGE FOR THE NURSE SELECT OPTION 2, PLEASE LEAVE A MESSAGE INCLUDING: YOUR NAME DATE OF BIRTH CALL BACK NUMBER REASON FOR CALL**this is important as we prioritize the call backs  YOU WILL RECEIVE A CALL BACK THE SAME DAY AS LONG AS YOU  CALL BEFORE 4:00 PM

## 2021-11-19 NOTE — Progress Notes (Signed)
ReDS Vest / Clip - 11/19/21 0800       ReDS Vest / Clip   Station Marker A    Ruler Value 30    ReDS Value Range High volume overload    ReDS Actual Value 42

## 2021-11-19 NOTE — Patient Instructions (Signed)
Medication Instructions:   No changes *If you need a refill on your cardiac medications before your next appointment, please call your pharmacy*   Lab Work:  Not needed   Testing/Procedures: Not needed   Follow-Up: At [facility], you and your health needs are our priority.  As part of our continuing mission to provide you with exceptional heart care, we have created designated Provider Care Teams.  These Care Teams include your primary Cardiologist (physician) and Advanced Practice Providers (APPs -  Physician Assistants and Nurse Practitioners) who all work together to provide you with the care you need, when you need it.     Your next appointment:   12 month(s) sleep clinic  The format for your next appointment:   In Person  Provider:   Dr Claiborne Billings     Other Instructions    pressure changes were done to your C-PAP

## 2021-11-19 NOTE — Telephone Encounter (Signed)
I looked and the PA submitted on 4/19 said medication was covered.  Did patient change plans?

## 2021-11-19 NOTE — Therapy (Signed)
OUTPATIENT PHYSICAL THERAPY TREATMENT NOTE   Patient Name: Maria Hudson MRN: [id number] DOB:06/01/1961, 61 y.o., female Today's Date: 11/20/2021 PCP: Nolene Ebbs, MD   REFERRING PROVIDER: Gentry Fitz, MD  END OF SESSION:   PT End of Session - 11/20/21 0813     Visit Number 8    Number of Visits 13    Date for PT Re-Evaluation 12/12/21    Authorization Type [place] MEDICAID [facility]    Authorization Time Period 10/23/21-12/22/21    Authorization - Visit Number 7    Authorization - Number of Visits 10    Progress Note Due on Visit 10    PT Start Time 0812    PT Stop Time 0845    PT Time Calculation (min) 33 min    Activity Tolerance Patient tolerated treatment well    Behavior During Therapy [name] for tasks assessed/performed                Past Medical History:  Diagnosis Date   Acute on chronic diastolic heart failure ([place]) 03/05/2021   Anemia    Arthritis    CAD (coronary artery disease)    CHF (congestive heart failure) (HCC)    CKD (chronic kidney disease) stage 4, GFR 15-29 ml/min (HCC) 03/05/2021   Colon polyps    CVA (cerebral vascular accident) ([place])    Depression    Diabetes mellitus without complication ([place])    Hypertension    Hypertensive urgency 04/14/2021   OSA (obstructive sleep apnea) 05/27/2021   Pneumonia    Pure hypercholesterolemia 05/27/2021   Renal disorder    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   Sleep apnea    Stroke [facility])    Past Surgical History:  Procedure Laterality Date   ABDOMINAL HYSTERECTOMY     BIOPSY  09/08/2021   Procedure: BIOPSY;  Surgeon: Doran Stabler, MD;  Location: [facility];  Service: Gastroenterology;;   CESAREAN SECTION     ESOPHAGOGASTRODUODENOSCOPY (EGD) WITH PROPOFOL N/A 09/08/2021   Procedure: ESOPHAGOGASTRODUODENOSCOPY (EGD) WITH PROPOFOL;  Surgeon: Doran Stabler, MD;  Location: [facility];  Service: Gastroenterology;  Laterality: N/A;  chest pain, epigastric pain   INNER EAR SURGERY  Bilateral    RIGHT HEART CATH N/A 05/14/2021   Procedure: RIGHT HEART CATH;  Surgeon: Larey Dresser, MD;  Location: [facility];  Service: Cardiovascular;  Laterality: N/A;   TONSILLECTOMY     age 31   Patient Active Problem List   Diagnosis Date Noted   Pure hypercholesterolemia 05/27/2021   Chronic diastolic CHF (congestive heart failure) ([place]) 04/29/2021   Hypertensive urgency 04/14/2021   Acute on chronic diastolic CHF (congestive heart failure) ([place]) 04/14/2021   Chest pain 03/05/2021   CAD (coronary artery disease) 03/05/2021   T2DM (type 2 diabetes mellitus) ([place]) 03/05/2021   CKD (chronic kidney disease) stage 4, GFR 15-29 ml/min (HCC) 03/05/2021   Class 3 obesity ([place]) 03/05/2021   CVA (cerebral vascular accident) ([place]) 03/05/2021   Acute on chronic diastolic heart failure ([place]) 03/05/2021   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   AKI (acute kidney injury) ([place]) 03/05/2021   Vitreous hemorrhage of left eye ([place]) 11/14/2020   Orthopnea 08/06/2020   Vision loss of right eye 08/06/2020   Excessive daytime sleepiness 01/11/2020   Iron deficiency anemia 12/14/2019   Vitreous hemorrhage, right ([place]) 12/04/2019   Diabetic neuropathy ([place]) 07/26/2019   Proliferative diabetic retinopathy of both 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 ([place]) 02/03/2019   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   Conductive hearing loss, bilateral 03/03/2018   Chronic bilateral low back pain without sciatica 08/21/2015   Chronic kidney disease (CKD) stage G3b/A3, moderately decreased glomerular filtration rate (GFR) between 30-44 mL/min/1.73 square meter and albuminuria creatinine ratio greater than 300 mg/g ([place]) 04/30/2015   Type 2 diabetes mellitus with diabetic polyneuropathy, with long-term current use of insulin ([place]) 02/15/2015   Advance directive on file 05/17/2014   Refusal of blood transfusions as patient is Jehovah's Witness 05/17/2014   OSA treated with BiPAP  02/23/2014   Coronary artery disease 07/07/2013   Severe obesity (BMI >= 40) ([place]) 05/18/2013   Neuropathy 29/79/8921   Diastolic heart failure ([place]) 10/16/2012   Essential hypertension 10/16/2012   REFERRING DIAG: Low back pain and SI dysfunction   THERAPY DIAG:  Other low back pain  Muscle spasm of back  Difficulty in walking, not elsewhere classified  PERTINENT HISTORY: Significant hx of heart issues, reports balance issues related to BP dropping with standing, Obesity, OA knees and hips   PRECAUTIONS: None  SUBJECTIVE: Pt reports she is able to tolerate walking longer distances. Pt reports her body fluid is about the same as her last visit.  PAIN:  Are you having pain? Yes: NPRS scale: 5/10 Pain location: low back Pain description: ache, throb Aggravating factors: Prolonged sitting, standing, and walking Relieving factors: Rest, pain meds as needed           6-9/10 pain range   OBJECTIVE: (objective measures completed at initial evaluation unless otherwise dated)   DIAGNOSTIC FINDINGS:  Not available in EPIC   PATIENT SURVEYS:  Modified Oswestry 40%    SCREENING FOR RED FLAGS: Bowel or bladder incontinence: No Cauda equina syndrome: No   COGNITION:           Overall cognitive status: Within functional limits for tasks assessed                          SENSATION: WFL   MUSCLE LENGTH: Hamstrings: Right WNLs deg; Left WNLs deg Marcello Moores test: Right WNLs deg; Left WNLs deg   POSTURE:  Forward head, rounded shoulder, decreased kyphosis, increased lordosis   PALPATION: TTP midline lower vertebrae, bilat upper gluteal   LUMBAR ROM:    Active  A/PROM  10/21/2021  Flexion Full No pain  Extension Full pressure LB pain mod  Right lateral flexion Full  R LB pain, mod  Left lateral flexion Full L LB pain, min  Right rotation Full R LB pain, mod  Left rotation Full LB pain, min   (Blank rows = not tested)   LE ROM:                       Grossly WNLs Active   Right 10/21/2021 Left 10/21/2021  Hip flexion      Hip extension      Hip abduction      Hip adduction      Hip internal rotation      Hip external rotation      Knee flexion      Knee extension      Ankle dorsiflexion      Ankle plantarflexion      Ankle inversion      Ankle eversion       (Blank rows = not tested)   LE MMT:  Myotomes for legs are equal with no overt weakness, but with decreased strength of the hips. Weak core. MMT Right 10/21/2021 Left 10/21/2021 Rt 11/18/21 LT 11/18/21  Hip flexion '4 4 4 4  ' Hip extension '4 4 4 4  ' Hip abduction '4 4 4 4  ' Hip adduction 4 4 4+ 4+  Hip internal rotation        Hip external rotation        Knee flexion        Knee extension        Ankle dorsiflexion        Ankle plantarflexion        Ankle inversion        Ankle eversion         (Blank rows = not tested)   LUMBAR SPECIAL TESTS:  Straight leg raise test: Negative and Slump test: Negative   FUNCTIONAL TESTS:  5 times sit to stand: 10/29/21 19.1 sec (hands on thighs) 2 minute walk test: 10/29/21 200 feet (one standing rest break. Frequent staggering due to dizziness).    GAIT: Distance walked: 285f Assistive device utilized: None Level of assistance: Complete Independence Comments: Decreased pace, decreased trunk rotation, valgus bilat legs   TODAY'S TREATMENT  [facility] Adult PT Treatment:                                                DATE: 11/20/21 Therapeutic Exercise: LTR x5 5 sec  Mod thomas stretch x2 60' each Trunk flexion c swiss ball forward and laterally x2 each 20" PPT hand slides on thighs to engage core x 10 for 5 sec x 2 sets Marching c PPT 2x10 Trunk flexion c swiss ball forward and laterally x2 each 20"  Manual Therapy: STM and DTM to the bilat paraspinals  Trigger Point Dry Needling Treatment: Skilled palpation for TrP and taut muscleSTM and DTm ID and  Pre-treatment instruction: Patient instructed on dry needling rationale, procedures,  and possible side effects including pain during treatment (achy,cramping feeling), bruising, drop of blood, lightheadedness, nausea, sweating. Patient Consent Given: Yes Education handout provided: Yes Muscles treated: Lumbar paraspinals  Needle size and number: .[id number] 3 Electrical stimulation performed: No Parameters: N/A Treatment response/outcome: Twitch response elicited and Palpable decrease in muscle tension Post-treatment instructions: Patient instructed to expect possible mild to moderate muscle soreness later today and/or tomorrow. Patient instructed in methods to reduce muscle soreness and to continue prescribed HEP. If patient was dry needled over the lung field, patient was instructed on signs and symptoms of pneumothorax and, however unlikely, to see immediate medical attention should they occur. Patient was also educated on signs and symptoms of infection and to seek medical attention should they occur. Patient verbalized understanding of these instructions and education.   [facility]                                                [facility]: [facility] Therapeutic Exercise: Nustep [id number] UE/LE 96% SPO2. Pt reports light DOE. Trunk flexion c swiss ball forward and laterally x2 each 20" PPT hand slides on thighs to engage core x 10 for 5 sec x 2 sets Marching c PPT 2x10 Bridge 2x10 5" Hip clams  2x10 PPT hand slides on thighs to engage core x 10 for 5 sec x 2 sets Bridge 10 x 2 5" Seated ROW 35# x 10 x 2 Seated lat pull down 20 # 10 x 2 Leg press 45# 10 x 2  MMT both LEs  [facility] Adult PT Treatment:                                                DATE: 11/13/21 Therapeutic Exercise: Resting BP- 152/78  Nustep 63mn L5 UE/LE 95% SPO2 Seated ROW 25# x 10 x 2 Seated lat pull down 20 # 10 x 2 Leg press 45# 10 x 2   PPT hand slides on thighs to engage core x 10 for 5 sec x 2 sets Bridge 10 x 2 5" Figure 4 pull 20 sec x 2 each Seated clam blue band 5 sec hold 10 x 2   Seated Marching c core engagement 2 sets of 10   PATIENT EDUCATION:  Education details: eval findings, POC, HEP Person educated: Patient Education method: Explanation, Demonstration, Tactile cues, Verbal cues, and Handouts Education comprehension: verbalized understanding, returned demonstration, verbal cues required, and tactile cues required     HOME EXERCISE PROGRAM:    ASSESSMENT:   CLINICAL IMPRESSION: PT was provided for STM/DTM to the lumbar paraspinals f/b TPDN. Pt then completed lumbopelvic flexibility and abdominal strengthening exs. Pt tolerated the session without adverse effects. Will assess pt's response to the introduction of TPDN the next PT session, Mod TLorraine Laxwas added to the pt's HEP. Pt will continue to benefit from skilled PT to address deficits to optimize trunk function with less pain.   OBJECTIVE IMPAIRMENTS decreased balance, difficulty walking, decreased strength, dizziness, increased muscle spasms, obesity, and pain.    ACTIVITY LIMITATIONS cleaning, community activity, driving, meal prep, occupation, laundry, and shopping.    PERSONAL FACTORS Fitness, Past/current experiences, Time since onset of injury/illness/exacerbation, and 3+ comorbidities:    Significant hx of heart issues, reports balance issues related to BP dropping with standing, Obesity, OA knees and hips are also affecting patient's functional outcome.    REHAB POTENTIAL: Good   CLINICAL DECISION MAKING: Stable/uncomplicated   EVALUATION COMPLEXITY: Low     GOALS:   SHORT TERM GOALS: Target date: 11/13/2021     Pt will be Ind in an initial HEP Baseline:started on eval Status 11/11/21- performs seated version of HEP Goal status: Met   2.  Pt will voice understanding of measures to assist with the decrease and management of back pain Baseline:  Goal status: Met     LONG TERM GOALS: Target date: 12/12/21   Increase pt's core strength and hip strength to 4+/5  for improved  function. 11/18/21: add and ER improved. See flow sheets Baseline: 4/5 Goal status: partially met   2.  Pt will report a decrease in back pain to 6/10 or less intermittently with daily activities Baseline: 6-10/10 Goal status: INITIAL   3.  Pt will be able to tolerate walking for 20 mins before needing to sitt down  Baseline: 7 min Goal status: INITIAL   4.  Pt will improve her 5xSTS time by the MCID of 4 sec as indication of improved function Baseline: TBA Goal status: INITIAL   5.  Pt will improve her 2MWT distance by the MCID of [id number] indication of improved function  Baseline: TBA Goal status: INITIAL   6.  Pt will be Ind in a final HEP to maintain achieved LOF Baseline: started on eval Goal status: INITIAL   7.  Pt's Mod Oswestry score will improve to 28% as indication of improved pain and function Baseline: started on eval Goal status: INITIAL     PLAN: PT FREQUENCY: 2x/week   PT DURATION: 6 weeks   PLANNED INTERVENTIONS: Therapeutic exercises, Therapeutic activity, Balance training, Gait training, Patient/Family education, Joint mobilization, Stair training, Aquatic Therapy, Dry Needling, Electrical stimulation, Spinal manipulation, Spinal mobilization, Cryotherapy, Moist heat, Taping, Traction, Ultrasound, Ionotophoresis 37m/ml Dexamethasone, and Manual therapy.   PLAN FOR NEXT SESSION: assess response to HEP, progress as able ,check BP at start of session.  pt has dizziness in standing (BP drops)  - has abdominal binder she plans to wear to assist in BP . (No restrictions per cardiologists) . Recheck trunk movements. ROM and %xSTS. Discussed possible completion of TPDM to low back. Begin assessment of LTGs  Angelin Cutrone MS, PT 11/20/21 11:40 AM

## 2021-11-19 NOTE — Progress Notes (Signed)
Cardiology Office Note    Date:  12/03/2021   ID:  Maria Hudson, DOB 11-17-1960, MRN [id number]  PCP:  Nolene Ebbs, MD  Cardiologist:  Shelva Majestic, MD (sleerp); Dr Oval Linsey, Dr. Aundra Dubin  New sleep consult referred by Dr. Oval Linsey for initial sleep evaluation following her diagnosis of obstructive sleep apnea and CPAP initiation   History of Present Illness:  Maria Hudson is a 61 y.o. female who has a history of CAD, chronic diastolic heart failure, diabetes mellitus, hypertension, remote CVA, and CKD.  She has had issues with supine hypertension with orthostatic hypotension.  She has had difficulty with uncontrolled hypertension and has required multiple drug regimen.  Due to concerns for obstructive sleep apnea with symptoms of snoring, daytime somnolence, nonrestorative sleep, and nocturia at least 3 times per night she was referred by Dr. Oval Linsey on June 01, 2021 for a diagnostic polysomnogram.  She was found to have moderate overall sleep apnea with an AHI of 17.3/h and events were severe with supine sleep with an AHI of 40.0 and during REM sleep with an AHI 57.0.  She had significant oxygen desaturation to a nadir of 77% and snored with moderate snoring volume throughout the procedure.  Absolutely was referred for a CPAP titration study on September 02, 2021.  CPAP was initiated at 5 cm and was titrated up to 11 cm of water where AHI was 1.2 and O2 nadir 88%.  Again early in the study at 6 cm there was significant oxygen desaturation to a nadir of 75%.  With CPAP therapy at higher pressures snoring resolved.  He subsequently initiated CPAP therapy and received a new ResMed AirSense 11 CPAP auto unit September 30, 2021 with [employer] as her DME company.  Her initial download from April 18 through Oct 29, 2021 shows excellent compliance with 29 of 30 days of usage at 97%.  Usage greater than 4 hours was also 97%.  Average sleep duration was 6 hours and 30 minutes.  Her unit is set at a minimum  pressure of 11 with maximum pressure of 16 and AHI was 0.9.  Her 95th percentile pressure was 14.2 with a maximum average pressure of 15.2.  A subsequent download from May 8 through November 18, 2021 again shows she is compliant.  However on June 2 she had a power outage at her house and as result her Internet not been reset such that it appears that she did not use therapy from June 2 until presently.  However, the patient admits she has used it consistently.  Her AHI was 0.6 and again 95th percentile pressure was 14.2 with maximum average pressure of 15.  Average use again was suboptimal at 5 hours and 28 minutes.  Presently, she feels improved with initiating CPAP therapy.  She is unaware of breakthrough snoring.  She continues to note residual daytime sleepiness.  I calculated Epworth Sleepiness Scale score in the office today which endorsed at 13 consistent with residual daytime sleepiness as shown below:   Epworth Sleepiness Scale: Situation   Chance of Dozing/Sleeping (0 = never , 1 = slight chance , 2 = moderate chance , 3 = high chance )   sitting and reading 2   watching TV 3   sitting inactive in a public place 2   being a passenger in a motor vehicle for an hour or more 1   lying down in the afternoon 2   sitting and talking to someone 1   sitting quietly after  lunch (no alcohol) 1   while stopped for a few minutes in traffic as the driver 1   Total Score  13    Presently, she denies any bruxism, restless legs, hypnagogic or hypnopompic hallucinations or cataplectic events.  She typically goes to bed at 10 PM and wakes up around 5:30 AM.  She is on antihypertensive medical regimen consisting of carvedilol 25 mg twice a day, clonidine 0.3 mg 3 times a day, hydralazine 100 mg 3 times a day, isosorbide 90 mg daily, nifedipine 120 mg daily, torsemide 20 mg.  An echo Doppler study in September 2022 showed normal EF at 55 to 60% with grade 1 diastolic dysfunction and normal RV function.  She did  not have any evidence for renal artery stenosis but on renal duplex imaging the left renal artery was not well visualized due to bowel gas.  PYP scan was not suggestive of transthyretin cardiac amyloidosis.  She is diabetic on insulin in addition to Ozempic.  She presents for her initial sleep evaluation.   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   Sleep apnea    Stroke [facility])     Past Surgical History:  Procedure Laterality Date   ABDOMINAL HYSTERECTOMY     BIOPSY  09/08/2021   Procedure: BIOPSY;  Surgeon: Sherrilyn Rist, MD;  Location: [facility];  Service: Gastroenterology;;   CESAREAN SECTION     ESOPHAGOGASTRODUODENOSCOPY (EGD) WITH PROPOFOL N/A 09/08/2021   Procedure: ESOPHAGOGASTRODUODENOSCOPY (EGD) WITH PROPOFOL;  Surgeon: Sherrilyn Rist, MD;  Location: [facility];  Service: Gastroenterology;  Laterality: N/A;  chest pain, epigastric pain   INNER EAR SURGERY Bilateral    RIGHT HEART CATH N/A 05/14/2021   Procedure: RIGHT HEART CATH;  Surgeon: Laurey Morale, MD;  Location: [facility] INVASIVE CV LAB;  Service: Cardiovascular;  Laterality: N/A;   TONSILLECTOMY     age 8    Current Medications: Outpatient Medications Prior to Visit  Medication Sig Dispense Refill   Accu-Chek Softclix Lancets lancets 3 (three) times daily.     acetaminophen (TYLENOL) 500 MG tablet Take 1,000 mg by mouth every 6 (six) hours as needed for  moderate pain or headache.     allopurinol (ZYLOPRIM) 100 MG tablet Take 100 mg by mouth in the morning.     aspirin EC 81 MG tablet Take 81 mg by mouth in the morning. Swallow whole.     atorvastatin (LIPITOR) 40 MG tablet Take 1 tablet (40 mg total) by mouth every evening. 90 tablet 3   buPROPion (WELLBUTRIN) 100 MG tablet Take 100 mg by mouth 2 (two) times daily.     carvedilol (COREG) 25 MG tablet Take 1 tablet (25 mg total) by mouth 2 (two) times daily with a meal. 60 tablet 0   cholecalciferol (VITAMIN D3) 25 MCG (1000 UNIT) tablet Take 2,000 Units by mouth daily.  cloNIDine (CATAPRES) 0.3 MG tablet Take 0.3 mg by mouth 3 (three) times daily.     diclofenac Sodium (VOLTAREN) 1 % GEL Apply 2 g topically 4 (four) times daily as needed (chest pain.).     ferrous sulfate 325 (65 FE) MG tablet Take 325 mg by mouth 2 (two) times daily.     fluticasone (FLONASE) 50 MCG/ACT nasal spray Place 1 spray into both nostrils daily as needed for allergies or rhinitis.     hydrALAZINE (APRESOLINE) 100 MG tablet Take 100 mg by mouth 3 (three) times daily.     insulin glargine (LANTUS) 100 unit/mL SOPN Inject 10 Units into the skin at bedtime.     isosorbide mononitrate (IMDUR) 60 MG 24 hr tablet Take 1.5 tablets (90 mg total) by mouth daily. (Patient taking differently: Take 90 mg by mouth every evening.) 135 tablet 3   loratadine (CLARITIN) 10 MG tablet Take 10 mg by mouth in the morning.     Multiple Vitamin (MULTIVITAMIN ADULT) TABS Take 1 tablet by mouth in the morning.     NIFEdipine (ADALAT CC) 60 MG 24 hr tablet Take 2 tablets (120 mg total) by mouth daily. 60 tablet 11   nitroGLYCERIN (NITROSTAT) 0.4 MG SL tablet Place 1 tablet (0.4 mg total) under the tongue every 5 (five) minutes as needed for chest pain. 20 tablet 0   NOVOLOG FLEXPEN 100 UNIT/ML FlexPen Inject 3 Units into the skin 3 (three) times daily with meals.     Omega-3 Fatty Acids (FISH OIL) 1000 MG CAPS Take 1,000 mg by mouth in the  morning.     oxyCODONE-acetaminophen (PERCOCET/ROXICET) 5-325 MG tablet Take 1 tablet by mouth 3 (three) times daily as needed for severe pain.     pantoprazole (PROTONIX) 40 MG tablet Take 40 mg by mouth daily as needed (upset stomach.).     potassium chloride SA (KLOR-CON M) 20 MEQ tablet Take 1 tablet (20 mEq total) by mouth daily. 30 tablet 3   pregabalin (LYRICA) 25 MG capsule Take 25 mg by mouth 2 (two) times daily.     sertraline (ZOLOFT) 50 MG tablet Take 50 mg by mouth in the morning.     sodium bicarbonate 650 MG tablet Take 650 mg by mouth 2 (two) times daily.     torsemide (DEMADEX) 20 MG tablet Take 2 tablets (40 mg total) by mouth daily for 3 days, THEN 1 tablet (20 mg total) daily. 90 tablet 3   traZODone (DESYREL) 50 MG tablet Take 50 mg by mouth at bedtime.     Semaglutide,0.25 or 0.5MG /DOS, (OZEMPIC, 0.25 OR 0.5 MG/DOSE,) 2 MG/3ML SOPN Inject 0.5mg  under the skin once weekly, please contact the office when you have completed the three remaining doses of 0.5 to move to the next dose. (Patient not taking: Reported on 11/19/2021) 1 mL 0   No facility-administered medications prior to visit.     Allergies:   Metformin and related, Hydrocodone, and Oxycodone-acetaminophen   Social History   Socioeconomic History   Marital status: Divorced    Spouse name: Not on file   Number of children: 2   Years of education: Not on file   Highest education level: Some college, no degree  Occupational History   Occupation: retired   Occupation: disabled  Tobacco Use   Smoking status: Never    Passive exposure: Never   Smokeless tobacco: Never  Vaping Use   Vaping Use: Never used  Substance and Sexual Activity   Alcohol use:  Not Currently   Drug use: Not Currently   Sexual activity: Not on file  Other Topics Concern   Not on file  Social History Narrative   Not on file   Social Determinants of Health   Financial Resource Strain: Low Risk  (04/15/2021)   Overall Financial  Resource Strain (CARDIA)    Difficulty of Paying Living Expenses: Not very hard  Food Insecurity: No Food Insecurity (11/12/2021)   Hunger Vital Sign    Worried About Running Out of Food in the Last Year: Never true    Ran Out of Food in the Last Year: Never true  Transportation Needs: No Transportation Needs (04/15/2021)   PRAPARE - Hydrologist (Medical): No    Lack of Transportation (Non-Medical): No  Physical Activity: Inactive (05/06/2021)   Exercise Vital Sign    Days of Exercise per Week: 0 days    Minutes of Exercise per Session: 0 min  Stress: Stress Concern Present (05/16/2021)   [place]    Feeling of Stress : Very much  Social Connections: Moderately Isolated (05/06/2021)   Social Connection and Isolation Panel [NHANES]    Frequency of Communication with Friends and Family: More than three times a week    Frequency of Social Gatherings with Friends and Family: More than three times a week    Attends Religious Services: More than 4 times per year    Active Member of [employer] or Organizations: No    Attends Archivist Meetings: Never    Marital Status: Divorced    Socially she was born in [place].  She is divorced and has 2 children ages 39 and 73.  She is on disability due to back issues.   Family History:  The patient's family history includes Breast cancer in her paternal aunt; CAD in her sister; Diabetes in her daughter, mother, paternal grandmother, sister, and son; Hypertension in her daughter, father, mother, sister, and son; Kidney disease in her sister; Other in her mother; Stroke in her mother.   Both parents are deceased.  Mother had a CVA.  1 sister is deceased with kidney disease.  ROS General: Negative; No fevers, chills, or night sweats;  HEENT: Negative; No changes in vision or hearing, sinus congestion, difficulty swallowing Pulmonary:  Negative; No cough, wheezing, shortness of breath, hemoptysis Cardiovascular: Difficult to control hypertension with supine hypertension with orthostatic hypotension.  Chronic diastolic heart failure GI: GERD GU: Negative; No dysuria, hematuria, or difficulty voiding Musculoskeletal: Negative; no myalgias, joint pain, or weakness Hematologic/Oncology: Negative; no easy bruising, bleeding Endocrine: Mellitus Neuro: Negative; no changes in balance, headaches Skin: Negative; No rashes or skin lesions Psychiatric: Negative; No behavioral problems, depression Sleep: See HPI   Other comprehensive 14 point system review is negative.   PHYSICAL EXAM:   VS:  BP 140/70 (BP Location: Left Arm)   Pulse 69   Ht 5' 3.5" (1.613 m)   Wt 225 lb 9.6 oz (102.3 kg)   SpO2 95%   BMI 39.34 kg/m     Repeat blood pressure by me was 178/70 supine and her blood pressure dropped to 130/70 standing.  Wt Readings from Last 3 Encounters:  11/19/21 225 lb (102.1 kg)  11/19/21 225 lb 9.6 oz (102.3 kg)  11/12/21 225 lb (102.1 kg)    General: Alert, oriented, no distress.  Skin: normal turgor, no rashes, warm and dry HEENT: Normocephalic, atraumatic. Pupils equal round and  reactive to light; sclera anicteric; extraocular muscles intact;  Nose without nasal septal hypertrophy Mouth/Parynx benign; Mallinpatti scale 3 Neck: No JVD, no carotid bruits; normal carotid upstroke Lungs: clear to ausculatation and percussion; no wheezing or rales Chest wall: without tenderness to palpitation Heart: PMI not displaced, RRR, s1 s2 normal, 1/6 systolic murmur, no diastolic murmur, no rubs, gallops, thrills, or heaves Abdomen: soft, nontender; no hepatosplenomehaly, BS+; abdominal aorta nontender and not dilated by palpation. Back: no CVA tenderness Pulses 2+ Musculoskeletal: full range of motion, normal strength, no joint deformities Extremities: 1+ edema no clubbing cyanosis, Homan's sign negative  Neurologic: grossly  nonfocal; Cranial nerves grossly wnl Psychologic: Normal mood and affect   Studies/Labs Reviewed:   November 19, 2021 ECG (independently read by me): NSR at 69, LAE  Recent Labs:    Latest Ref Rng & Units 11/19/2021    9:07 AM 09/25/2021    8:03 AM 08/19/2021   10:38 AM  BMP  Glucose 70 - 99 mg/dL 165  90  122   BUN 6 - 20 mg/dL 54  81  77   Creatinine 0.44 - 1.00 mg/dL 2.89  2.69  2.51   BUN/Creat Ratio 9 - 23  30    Sodium 135 - 145 mmol/L 140  143  136   Potassium 3.5 - 5.1 mmol/L 4.7  4.5  3.9   Chloride 98 - 111 mmol/L 107  106  105   CO2 22 - 32 mmol/L $RemoveB'24  22  21   'XmawBEzt$ Calcium 8.9 - 10.3 mg/dL 9.1  9.1  9.4         Latest Ref Rng & Units 09/25/2021    8:03 AM 04/30/2021    3:47 AM 04/29/2021   10:26 PM  Hepatic Function  Total Protein 6.0 - 8.5 g/dL 6.6  6.3  6.1   Albumin 3.8 - 4.9 g/dL 4.0  3.1  3.1   AST 0 - 40 IU/L $Remov'17  12  13   'RNEqte$ ALT 0 - 32 IU/L $Remov'10  8  9   'XwQKvR$ Alk Phosphatase 44 - 121 IU/L 59  45  44   Total Bilirubin 0.0 - 1.2 mg/dL <0.2  0.7  0.6   Bilirubin, Direct 0.0 - 0.2 mg/dL   <0.1        Latest Ref Rng & Units 08/19/2021   10:38 AM 05/14/2021   10:21 AM 05/05/2021    9:34 AM  CBC  WBC 4.0 - 10.5 K/uL 9.4   4.3   Hemoglobin 12.0 - 15.0 g/dL 11.5  10.5    11.2  10.5   Hematocrit 36.0 - 46.0 % 35.3  31.0    33.0  33.4   Platelets 150 - 400 K/uL 280   213    Lab Results  Component Value Date   MCV 81.7 08/19/2021   MCV 85.4 05/05/2021   MCV 83.5 05/03/2021   Lab Results  Component Value Date   TSH 3.173 04/30/2021   Lab Results  Component Value Date   HGBA1C 6.7 (H) 04/29/2021     BNP    Component Value Date/Time   BNP 114.2 (H) 11/19/2021 0907    ProBNP No results found for: "PROBNP"   Lipid Panel     Component Value Date/Time   CHOL 156 09/25/2021 0803   TRIG 109 09/25/2021 0803   HDL 58 09/25/2021 0803   CHOLHDL 2.7 09/25/2021 0803   LDLCALC 78 09/25/2021 0803   LABVLDL 20 09/25/2021 0803  RADIOLOGY: VAS [place] UPPER EXT VEIN  MAPPING (PRE-OP AVF)  Result Date: 11/12/2021 UPPER EXTREMITY VEIN MAPPING Patient Name:  Maria Hudson  Date of Exam:   11/12/2021 Medical Rec #: [id number]       Accession #:    [id number] Date of Birth: 11-06-1960       Patient Gender: F Patient Age:   89 years Exam Location:  [facility] Procedure:      VAS [place] UPPER EXT VEIN MAPPING (PRE-OP AVF) Referring Phys: Servando Snare --------------------------------------------------------------------------------  Indications: Pre-access. Comparison Study: None Performing Technologist: Ivan Croft  Examination Guidelines: A complete evaluation includes B-mode imaging, spectral Doppler, color Doppler, and power Doppler as needed of all accessible portions of each vessel. Bilateral testing is considered an integral part of a complete examination. Limited examinations for reoccurring indications may be performed as noted. +-----------------+-------------+----------+---------+ Right Cephalic   Diameter (cm)Depth (cm)Findings  +-----------------+-------------+----------+---------+ Shoulder             0.28        1.20             +-----------------+-------------+----------+---------+ Prox upper arm       0.36        0.84             +-----------------+-------------+----------+---------+ Mid upper arm        0.40        0.61             +-----------------+-------------+----------+---------+ Dist upper arm       0.45        0.50             +-----------------+-------------+----------+---------+ Antecubital fossa    0.59        0.22             +-----------------+-------------+----------+---------+ Prox forearm         0.41        0.49   branching +-----------------+-------------+----------+---------+ Mid forearm          0.29        0.54             +-----------------+-------------+----------+---------+ Dist forearm         0.35        0.20   branching +-----------------+-------------+----------+---------+  +-----------------+-------------+----------+---------+ Right Basilic    Diameter (cm)Depth (cm)Findings  +-----------------+-------------+----------+---------+ Prox upper arm       0.18        1.34             +-----------------+-------------+----------+---------+ Mid upper arm        0.18        1.25             +-----------------+-------------+----------+---------+ Dist upper arm       0.19        1.14             +-----------------+-------------+----------+---------+ Antecubital fossa    0.24        0.88   branching +-----------------+-------------+----------+---------+ Prox forearm         0.21        0.26             +-----------------+-------------+----------+---------+ +-----------------+-------------+----------+---------+ Left Cephalic    Diameter (cm)Depth (cm)Findings  +-----------------+-------------+----------+---------+ Shoulder             0.31        1.11             +-----------------+-------------+----------+---------+ Prox upper arm  0.33        1.05             +-----------------+-------------+----------+---------+ Mid upper arm        0.29        0.61             +-----------------+-------------+----------+---------+ Dist upper arm       0.37        0.37             +-----------------+-------------+----------+---------+ Antecubital fossa    0.44        0.22             +-----------------+-------------+----------+---------+ Prox forearm         0.35        0.65   branching +-----------------+-------------+----------+---------+ Mid forearm          0.21        0.63             +-----------------+-------------+----------+---------+ Dist forearm         0.22        0.24             +-----------------+-------------+----------+---------+ +-----------------+-------------+----------+---------+ Left Basilic     Diameter (cm)Depth (cm)Findings  +-----------------+-------------+----------+---------+ Prox upper arm        0.34        1.50             +-----------------+-------------+----------+---------+ Mid upper arm        0.27        1.32   branching +-----------------+-------------+----------+---------+ Dist upper arm       0.21        0.86             +-----------------+-------------+----------+---------+ Antecubital fossa    0.20        0.41   branching +-----------------+-------------+----------+---------+ Prox forearm         0.11        0.27             +-----------------+-------------+----------+---------+ *See table(s) above for measurements and observations.  Diagnosing physician: Servando Snare MD Electronically signed by Servando Snare MD on 11/12/2021 at 5:07:53 PM.    Final    VAS [place] UPPER EXTREMITY ARTERIAL DUPLEX  Result Date: 11/12/2021  UPPER EXTREMITY DUPLEX STUDY Patient Name:  Maria Hudson  Date of Exam:   11/12/2021 Medical Rec #: [id number]       Accession #:    [id number] Date of Birth: May 28, 1961       Patient Gender: F Patient Age:   30 years Exam Location:  [facility] Procedure:      VAS [place] UPPER EXTREMITY ARTERIAL DUPLEX Referring Phys: Servando Snare --------------------------------------------------------------------------------  Indications: Pre-access.  Risk Factors: Hypertension, hyperlipidemia, Diabetes, coronary artery disease,               prior CVA. Comparison Study: None Performing Technologist: Ivan Croft  Examination Guidelines: A complete evaluation includes B-mode imaging, spectral Doppler, color Doppler, and power Doppler as needed of all accessible portions of each vessel. Bilateral testing is considered an integral part of a complete examination. Limited examinations for reoccurring indications may be performed as noted.  Right Pre-Dialysis Findings: +-----------------------+----------+--------------------+---------+--------+ Location               PSV (cm/s)Intralum. Diam. (cm)Waveform Comments  +-----------------------+----------+--------------------+---------+--------+ Brachial Antecub. fossa107       0.53                triphasic         +-----------------------+----------+--------------------+---------+--------+  Radial Art at Wrist    104       0.28                triphasic         +-----------------------+----------+--------------------+---------+--------+ Ulnar Art at Wrist     103       0.22                triphasic         +-----------------------+----------+--------------------+---------+--------+  Left Pre-Dialysis Findings: +-----------------------+----------+--------------------+---------+--------+ Location               PSV (cm/s)Intralum. Diam. (cm)Waveform Comments +-----------------------+----------+--------------------+---------+--------+ Brachial Antecub. fossa128       0.47                triphasic         +-----------------------+----------+--------------------+---------+--------+ Radial Art at Wrist    106       0.28                triphasic         +-----------------------+----------+--------------------+---------+--------+ Ulnar Art at Wrist     103       0.28                triphasic         +-----------------------+----------+--------------------+---------+--------+  Electronically signed by Servando Snare MD on 11/12/2021 at 5:07:47 PM.    Final      Additional studies/ records that were reviewed today include:   06/01/2021  CLINICAL INFORMATION Sleep Study Type: NPSG   Indication for sleep study: Obesity, OSA, Snoring   Epworth Sleepiness Score: 17   SLEEP STUDY TECHNIQUE As per the AASM Manual for the Scoring of Sleep and Associated Events v2.3 (April 2016) with a hypopnea requiring 4% desaturations.   The channels recorded and monitored were frontal, central and occipital EEG, electrooculogram (EOG), submentalis EMG (chin), nasal and oral airflow, thoracic and abdominal wall motion, anterior tibialis EMG, snore microphone,  electrocardiogram, and pulse oximetry.   MEDICATIONS Medications self-administered by patient taken the night of the study : TRAZODONE, TYLENOL, LIPITOR, clondine, CARDURA, IMDUR   SLEEP ARCHITECTURE The study was initiated at 10:14:53 PM and ended at 1:47:29 AM.   Sleep onset time was 27.0 minutes and the sleep efficiency was 55.5%%. The total sleep time was 118 minutes.   Stage REM latency was 130.5 minutes.   The patient spent 9.7%% of the night in stage N1 sleep, 62.3%% in stage N2 sleep, 2.1%% in stage N3 and 25.9% in REM.   Alpha intrusion was absent.   Supine sleep was 1.27%.   RESPIRATORY PARAMETERS The overall apnea/hypopnea index (AHI) was 17.3 per hour.  The respiratory disturbance index (RDI was 19.8/h. There were 1 total apneas, including 1 obstructive, 0 central and 0 mixed apneas. There were 33 hypopneas and 5 RERAs.   The AHI during Stage REM sleep was 57.0 per hour.   AHI while supine was 40.0 per hour.   The mean oxygen saturation was 89.9%. The minimum SpO2 during sleep was 77.0%.   Moderate snoring was noted during this study.   CARDIAC DATA The 2 lead EKG demonstrated sinus rhythm. The mean heart rate was 66.4 beats per minute. Other EKG findings include: None.   LEG MOVEMENT DATA The total PLMS were 0 with a resulting PLMS index of 0.0. Associated arousal with leg movement index was 0.0 .   IMPRESSIONS - Moderate obstructive sleep apnea occurred during this study (AHI 17.3/h; RDI 19.8/h).Events were severe  with supine sleep (AHI 40.0/h) and during REM sleep (AHI 57.0/h). - Significant oxygen desaturation to a nadir of 77.0%. - The patient snored with moderate snoring volume. - No cardiac abnormalities were noted during this study. - Clinically significant periodic limb movements did not occur during sleep. No significant associated arousals.   DIAGNOSIS - Obstructive Sleep Apnea (G47.33) - Nocturnal Hypoxemia (G47.36)   RECOMMENDATIONS - In this  patient with significant cardiovascular co-morbidities recommend an in-lab therapeutic CPAP titration to determine optimal pressure required to alleviate sleep disordered breathing. If unable to have an in-lab titration then initiate Auto-PAP with EPR of 3 at 7 - 18 cm of water. - Effort should be made to optimize nasal and oropharyngeal patency. - Positional therapy avoiding supine position during sleep. - Avoid alcohol, sedatives and other CNS depressants that may worsen sleep apnea and disrupt normal sleep architecture. - Sleep hygiene should be reviewed to assess factors that may improve sleep quality. - Weight management (BMI 41) and regular exercise should be initiated or continued if appropriate.    09/02/2021 CLINICAL INFORMATION The patient is referred for a CPAP titration to treat sleep apnea.   Date of NPSG: 06/01/2021: AHI 17.3/h; RDI 19.8/h; REM AHI 57.0/h; supine AHI 40/h; O2 nadir 77%.   SLEEP STUDY TECHNIQUE As per the AASM Manual for the Scoring of Sleep and Associated Events v2.3 (April 2016) with a hypopnea requiring 4% desaturations.   The channels recorded and monitored were frontal, central and occipital EEG, electrooculogram (EOG), submentalis EMG (chin), nasal and oral airflow, thoracic and abdominal wall motion, anterior tibialis EMG, snore microphone, electrocardiogram, and pulse oximetry. Continuous positive airway pressure (CPAP) was initiated at the beginning of the study and titrated to treat sleep-disordered breathing.   MEDICATIONS acetaminophen (TYLENOL) 500 MG tablet allopurinol (ZYLOPRIM) 100 MG tablet aspirin EC 81 MG tablet atorvastatin (LIPITOR) 40 MG tablet buPROPion (WELLBUTRIN) 100 MG tablet carvedilol (COREG) 25 MG tablet chlorthalidone (HYGROTON) 25 MG tablet cloNIDine (CATAPRES - DOSED IN MG/24 HR) 0.3 mg/24hr patch diclofenac Sodium (VOLTAREN) 1 % GEL doxycycline (VIBRAMYCIN) 100 MG capsule ferrous sulfate 325 (65 FE) MG tablet fluticasone  (FLONASE) 50 MCG/ACT nasal spray hydrALAZINE (APRESOLINE) 100 MG tablet insulin glargine (LANTUS) 100 unit/mL SOPN isosorbide mononitrate (IMDUR) 60 MG 24 hr tablet loratadine (CLARITIN) 10 MG tablet Multiple Vitamin (MULTIVITAMIN ADULT) TABS NIFEdipine (ADALAT CC) 60 MG 24 hr tablet nitroGLYCERIN (NITROSTAT) 0.4 MG SL tablet NOVOLOG FLEXPEN 100 UNIT/ML FlexPen Omega-3 Fatty Acids (FISH OIL) 1000 MG CAPS oxyCODONE-acetaminophen (PERCOCET/ROXICET) 5-325 MG tablet pantoprazole (PROTONIX) 40 MG tablet pregabalin (LYRICA) 25 MG capsule sertraline (ZOLOFT) 50 MG tablet torsemide (DEMADEX) 20 MG tablet traZODone (DESYREL) 50 MG tablet Medications self-administered by patient taken the night of the study : TRAZODONE, LIPITOR, Clonidine patch, HYDRALAZINE, OXYCODONE HCL   TECHNICIAN COMMENTS Comments added by technician: None Comments added by scorer: N/A   RESPIRATORY PARAMETERS Optimal PAP Pressure (cm):  11        AHI at Optimal Pressure (/hr):            1.2 Overall Minimal O2 (%):         75.0     Supine % at Optimal Pressure (%):    0 Minimal O2 at Optimal Pressure (%): 88.0        SLEEP ARCHITECTURE The study was initiated at 10:31:22 PM and ended at 5:21:07 AM.   Sleep onset time was 76.0 minutes and the sleep efficiency was 77.2%%. The total sleep time was 316.3 minutes.  The patient spent 5.7%% of the night in stage N1 sleep, 68.1%% in stage N2 sleep, 0.0%% in stage N3 and 26.2% in REM.Stage REM latency was 65.0 minutes   Wake after sleep onset was 17.5. Alpha intrusion was absent. Supine sleep was 49.00%.   CARDIAC DATA The 2 lead EKG demonstrated sinus rhythm. The mean heart rate was 57.2 beats per minute. Other EKG findings include: PVCs.   LEG MOVEMENT DATA The total Periodic Limb Movements of Sleep (PLMS) were 0. The PLMS index was 0.0. A PLMS index of <15 is considered normal in adults.   IMPRESSIONS - CPAP was initiated at 5 cm and was titrated to optimal PAP  pressure st 11 cm of water (AHI 1.2/h; RDI 1.2/h; O2 nadir 88%). - Central sleep apnea was not noted during this titration (CAI 0.2/h). - Severe oxygen desaturations to a nadir of 75% at 6 cm of water. - No snoring was audible during this study. - 2-lead EKG demonstrated: isolated PVCs - Clinically significant periodic limb movements were not noted during this study. Arousals associated with PLMs were rare.   DIAGNOSIS - Obstructive Sleep Apnea (G47.33)   RECOMMENDATIONS - Recommend an initial trial of CPAP Auto therapy with EPR of 3 at 11 - 16 cm H2O with heated humidification. A Large size [employer] Full Face Evora Full mask was used for the titration. - Effort should be made to optimize nasal and orophayngeal patency. - Avoid alcohol, sedatives and other CNS depressants that may worsen sleep apnea and disrupt normal sleep architecture. - Sleep hygiene should be reviewed to assess factors that may improve sleep quality. - Weight management (BMI 41) and regular exercise should be initiated or continued. - Recommend a download and sleep clinic evaluation after 4 weeks of therapy.     ASSESSMENT:    1. OSA (obstructive sleep apnea)   2. Coronary artery disease due to lipid rich plaque   3. Resistant hypertension   4. Excessive daytime sleepiness   5. Orthostatic hypotension   6. Type 2 diabetes mellitus with diabetic polyneuropathy, with long-term current use of insulin (Maria Hudson)   7. Morbid obesity (Maria Hudson)   8. 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  She is followed by Dr. Oval Linsey for cardiology care in light of her difficult control hypertension.  She had symptoms very concerning for obstructive sleep apnea with snoring, daytime somnolence, nonrestorative sleep, difficulty sleeping supine, as well as nocturia at least 3-4 times per night.  As result she was referred by Dr. Oval Linsey for a sleep evaluation and was found to have at least moderate overall sleep apnea but sleep apnea was severe with supine sleep and during REM sleep with rem AHI of 57.0/h.  She had significant oxygen desaturation of 77%.  On her CPAP titration study which was done on September 02, 2021 CPAP was titrated up to 11 cm water pressure.  During that study she again had severe oxygen desaturation to a nadir of 75% at 6 cm water.  Due to her positional opponent with events being more severe with supine sleep and during REM sleep she was initially started on a regimen of CPAP therapy at a range of 11 to 16 cm.  She received her CPAP ResMed AirSense 11 auto unit on September 30, 2021.  Her mask is a resident met AirFit F30i mask.  On her subsequent downloads, she is meeting compliance standards.  AHI is excellent with treatment at 0.9 and most recently at 0.6.  However she recently had a power outage which has accounted for her in and at not recognizing her usage over the past week.  During her evaluation today I had an extensive discussion with her regarding normal sleep architecture and potential disruptive sleep arc check jerking contributed by sleep apnea.  I discussed the typical normal parasympathetic response of non-REM sleep slowing heart rate as well as lowering blood pressure but with frequent sympathetic arousals typically with sleep apnea there are results and potential inability to reduce the typical diastolic dip of 20 mm during sleep and may contribute to difficult to control hypertension particularly  resistant hypertension in the setting of severe REM related sleep apnea.  I discussed its implications and potential nocturnal arrhythmias, and increased risk for atrial fibrillation.  In addition I discussed effects on insulin resistance, increased inflammatory markers, as well as GERD.  With her significant nocturnal hypoxemia I also discussed potential for nocturnal ischemia both cardiac as well as cerebrovascular.  Prior to therapy, she was having nocturia at least 3-4 times per night.  I extensively reviewed the pathophysiology associated this an improvement in her nocturia resulting from initiation of therapy.  Since her 95th percentile pressure is elevated at 14 with maximum pressure of 15 I will change her parameters from 11 to 16 cm of water to 12 to 18 cm of water.  Clinically her sleep is significantly improved.  She continues to have elevated blood pressure supine with a greater than 40 mm orthostatic drop with standing.  She will be followed up with Dr. Oval Linsey as well as CHF clinic with Dr. Aundra Dubin.  Answered all her questions.  As long as she remains stable I will see her in 1 year for reevaluation or sooner as needed.   Medication Adjustments/Labs and Tests Ordered: Current medicines are reviewed at length with the patient today.  Concerns regarding medicines are outlined above.  Medication changes, Labs and Tests ordered today are listed in the Patient Instructions below. Patient Instructions  Medication Instructions:   No changes *If you need a refill on your cardiac medications before your next appointment, please call your pharmacy*   Lab Work:  Not needed   Testing/Procedures: Not needed   Follow-Up: At [facility], you and your health needs are our priority.  As part of our continuing mission to provide you with exceptional heart care, we have created designated Provider Care Teams.  These Care Teams include your primary Cardiologist (physician) and Advanced Practice  Providers (APPs -  Physician Assistants and Nurse Practitioners) who all work together to provide you with the care you need, when you need it.     Your next appointment:   12 month(s) sleep clinic  The format for your next appointment:   In Person  Provider:   Dr Claiborne Billings     Other Instructions    pressure changes were done to your C-PAP    Signed, Shelva Majestic, MD, [facility], ABSM Diplomate, American Board of Sleep Medicine  12/03/2021 9:07 AM  [facility] [address] [place], [place], [place]  [address] Phone: [phone]

## 2021-11-19 NOTE — Telephone Encounter (Signed)
Follow Up:    Calling back, concerning prior authorization.

## 2021-11-19 NOTE — Telephone Encounter (Signed)
PA resubmitted for Ozempic.  Updated Key: Maria Hudson

## 2021-11-20 ENCOUNTER — Ambulatory Visit: Payer: Medicaid Other

## 2021-11-20 DIAGNOSIS — R262 Difficulty in walking, not elsewhere classified: Secondary | ICD-10-CM

## 2021-11-20 DIAGNOSIS — M5459 Other low back pain: Secondary | ICD-10-CM

## 2021-11-20 DIAGNOSIS — M62830 Muscle spasm of back: Secondary | ICD-10-CM

## 2021-11-20 NOTE — Patient Instructions (Signed)

## 2021-11-20 NOTE — Telephone Encounter (Signed)
Updated PA approved for patient. Patient should be able to fill now at pharmacy.

## 2021-11-25 ENCOUNTER — Ambulatory Visit: Payer: Medicaid Other

## 2021-11-25 NOTE — Therapy (Incomplete)
OUTPATIENT PHYSICAL THERAPY TREATMENT NOTE   Patient Name: Maria Hudson MRN: [id number] DOB:1960-09-28, 61 y.o., female Today's Date: 11/25/2021 PCP: Nolene Ebbs, MD   REFERRING PROVIDER: Gentry Fitz, MD  END OF SESSION:        Past Medical History:  Diagnosis Date   Acute on chronic diastolic heart failure ([place]) 03/05/2021   Anemia    Arthritis    CAD (coronary artery disease)    CHF (congestive heart failure) (HCC)    CKD (chronic kidney disease) stage 4, GFR 15-29 ml/min ([place]) 03/05/2021   Colon polyps    CVA (cerebral vascular accident) ([place])    Depression    Diabetes mellitus without complication ([place])    Hypertension    Hypertensive urgency 04/14/2021   OSA (obstructive sleep apnea) 05/27/2021   Pneumonia    Pure hypercholesterolemia 05/27/2021   Renal disorder    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   Sleep apnea    Stroke [facility])    Past Surgical History:  Procedure Laterality Date   ABDOMINAL HYSTERECTOMY     BIOPSY  09/08/2021   Procedure: BIOPSY;  Surgeon: Doran Stabler, MD;  Location: [facility];  Service: Gastroenterology;;   CESAREAN SECTION     ESOPHAGOGASTRODUODENOSCOPY (EGD) WITH PROPOFOL N/A 09/08/2021   Procedure: ESOPHAGOGASTRODUODENOSCOPY (EGD) WITH PROPOFOL;  Surgeon: Doran Stabler, MD;  Location: [facility];  Service: Gastroenterology;  Laterality: N/A;  chest pain, epigastric pain   INNER EAR SURGERY Bilateral    RIGHT HEART CATH N/A 05/14/2021   Procedure: RIGHT HEART CATH;  Surgeon: Larey Dresser, MD;  Location: [place] [facility];  Service: Cardiovascular;  Laterality: N/A;   TONSILLECTOMY     age 89   Patient Active Problem List   Diagnosis Date Noted   Pure hypercholesterolemia 05/27/2021   Chronic diastolic CHF (congestive heart failure) ([place]) 04/29/2021   Hypertensive urgency 04/14/2021   Acute on chronic diastolic CHF (congestive heart failure) ([place]) 04/14/2021   Chest pain 03/05/2021   CAD (coronary  artery disease) 03/05/2021   T2DM (type 2 diabetes mellitus) ([place]) 03/05/2021   CKD (chronic kidney disease) stage 4, GFR 15-29 ml/min ([place]) 03/05/2021   Class 3 obesity ([place]) 03/05/2021   CVA (cerebral vascular accident) ([place]) 03/05/2021   Acute on chronic diastolic heart failure ([place]) 03/05/2021   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   AKI (acute kidney injury) ([place]) 03/05/2021   Vitreous hemorrhage of left eye ([place]) 11/14/2020   Orthopnea 08/06/2020   Vision loss of right eye 08/06/2020   Excessive daytime sleepiness 01/11/2020   Iron deficiency anemia 12/14/2019   Vitreous hemorrhage, right ([place]) 12/04/2019   Diabetic neuropathy ([place]) 07/26/2019   Proliferative diabetic retinopathy of both 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 ([place]) 02/03/2019   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   Conductive hearing loss, bilateral 03/03/2018   Chronic bilateral low back pain without sciatica 08/21/2015   Chronic kidney disease (CKD) stage G3b/A3, moderately decreased glomerular filtration rate (GFR) between 30-44 mL/min/1.73 square meter and albuminuria creatinine ratio greater than 300 mg/g ([place]) 04/30/2015   Type 2 diabetes mellitus with diabetic polyneuropathy, with long-term current use of insulin ([place]) 02/15/2015   Advance directive on file 05/17/2014   Refusal of blood transfusions as patient is Jehovah's Witness 05/17/2014   OSA treated with BiPAP 02/23/2014   Coronary artery disease 07/07/2013   Severe obesity (BMI >= 40) ([place]) 05/18/2013   Neuropathy 38/46/6599   Diastolic heart failure ([place]) 10/16/2012   Essential hypertension  10/16/2012   REFERRING DIAG: Low back pain and SI dysfunction   THERAPY DIAG:  No diagnosis found.  PERTINENT HISTORY: Significant hx of heart issues, reports balance issues related to BP dropping with standing, Obesity, OA knees and hips   PRECAUTIONS: None  SUBJECTIVE: Pt reports she is able to tolerate walking longer distances.  Pt reports her body fluid is about the same as her last visit.  PAIN:  Are you having pain? Yes: NPRS scale: 5/10 Pain location: low back Pain description: ache, throb Aggravating factors: Prolonged sitting, standing, and walking Relieving factors: Rest, pain meds as needed           6-9/10 pain range   OBJECTIVE: (objective measures completed at initial evaluation unless otherwise dated)   DIAGNOSTIC FINDINGS:  Not available in EPIC   PATIENT SURVEYS:  Modified Oswestry 40%    SCREENING FOR RED FLAGS: Bowel or bladder incontinence: No Cauda equina syndrome: No   COGNITION:           Overall cognitive status: Within functional limits for tasks assessed                          SENSATION: WFL   MUSCLE LENGTH: Hamstrings: Right WNLs deg; Left WNLs deg Marcello Moores test: Right WNLs deg; Left WNLs deg   POSTURE:  Forward head, rounded shoulder, decreased kyphosis, increased lordosis   PALPATION: TTP midline lower vertebrae, bilat upper gluteal   LUMBAR ROM:    Active  A/PROM  10/21/2021  Flexion Full No pain  Extension Full pressure LB pain mod  Right lateral flexion Full  R LB pain, mod  Left lateral flexion Full L LB pain, min  Right rotation Full R LB pain, mod  Left rotation Full LB pain, min   (Blank rows = not tested)   LE ROM:                       Grossly WNLs Active  Right 10/21/2021 Left 10/21/2021  Hip flexion      Hip extension      Hip abduction      Hip adduction      Hip internal rotation      Hip external rotation      Knee flexion      Knee extension      Ankle dorsiflexion      Ankle plantarflexion      Ankle inversion      Ankle eversion       (Blank rows = not tested)   LE MMT:                       Myotomes for legs are equal with no overt weakness, but with decreased strength of the hips. Weak core. MMT Right 10/21/2021 Left 10/21/2021 Rt 11/18/21 LT 11/18/21  Hip flexion _0 Hip extension _1 Hip abduction _2 Hip  adduction 4 4 4+ 4+  Hip internal rotation        Hip external rotation        Knee flexion        Knee extension        Ankle dorsiflexion        Ankle plantarflexion        Ankle inversion        Ankle eversion         (  Blank rows = not tested)   LUMBAR SPECIAL TESTS:  Straight leg raise test: Negative and Slump test: Negative   FUNCTIONAL TESTS:  5 times sit to stand: 10/29/21 19.1 sec (hands on thighs) 2 minute walk test: 10/29/21 200 feet (one standing rest break. Frequent staggering due to dizziness).    GAIT: Distance walked: 227f Assistive device utilized: None Level of assistance: Complete Independence Comments: Decreased pace, decreased trunk rotation, valgus bilat legs   TODAY'S TREATMENT  [facility] Adult PT Treatment:                                                DATE: 11/25/21 Therapeutic Exercise: *** Manual Therapy: *** Neuromuscular re-ed: *** Therapeutic Activity: *** Modalities: *** Self Care: ***  [facility] Treatment:                                                DATE: 11/20/21 Therapeutic Exercise: LTR x5 5 sec  Mod thomas stretch x2 60' each Trunk flexion c swiss ball forward and laterally x2 each 20" PPT hand slides on thighs to engage core x 10 for 5 sec x 2 sets Marching c PPT 2x10 Trunk flexion c swiss ball forward and laterally x2 each 20"  Manual Therapy: STM and DTM to the bilat paraspinals  Trigger Point Dry Needling Treatment: Skilled palpation for TrP and taut muscleSTM and DTm ID and  Pre-treatment instruction: Patient instructed on dry needling rationale, procedures, and possible side effects including pain during treatment (achy,cramping feeling), bruising, drop of blood, lightheadedness, nausea, sweating. Patient Consent Given: Yes Education handout provided: Yes Muscles treated: Lumbar paraspinals  Needle size and number: .[id number] 3 Electrical stimulation performed: No Parameters: N/A Treatment response/outcome: Twitch  response elicited and Palpable decrease in muscle tension Post-treatment instructions: Patient instructed to expect possible mild to moderate muscle soreness later today and/or tomorrow. Patient instructed in methods to reduce muscle soreness and to continue prescribed HEP. If patient was dry needled over the lung field, patient was instructed on signs and symptoms of pneumothorax and, however unlikely, to see immediate medical attention should they occur. Patient was also educated on signs and symptoms of infection and to seek medical attention should they occur. Patient verbalized understanding of these instructions and education.   [facility]                                                [facility]: [facility] Therapeutic Exercise: Nustep [id number] UE/LE 96% SPO2. Pt reports light DOE. Trunk flexion c swiss ball forward and laterally x2 each 20" PPT hand slides on thighs to engage core x 10 for 5 sec x 2 sets Marching c PPT 2x10 Bridge 2x10 5" Hip clams 2x10 PPT hand slides on thighs to engage core x 10 for 5 sec x 2 sets Bridge 10 x 2 5" Seated ROW 35# x 10 x 2 Seated lat pull down 20 # 10 x 2 Leg press 45# 10 x 2  MMT both LEs  [facility] Adult PT Treatment:  DATE: 11/13/21 Therapeutic Exercise: Resting BP- 152/78  Nustep 46mn L5 UE/LE 95% SPO2 Seated ROW 25# x 10 x 2 Seated lat pull down 20 # 10 x 2 Leg press 45# 10 x 2   PPT hand slides on thighs to engage core x 10 for 5 sec x 2 sets Bridge 10 x 2 5" Figure 4 pull 20 sec x 2 each Seated clam blue band 5 sec hold 10 x 2  Seated Marching c core engagement 2 sets of 10   PATIENT EDUCATION:  Education details: eval findings, POC, HEP Person educated: Patient Education method: Explanation, Demonstration, Tactile cues, Verbal cues, and Handouts Education comprehension: verbalized understanding, returned demonstration, verbal cues required, and tactile cues required     HOME EXERCISE  PROGRAM:    ASSESSMENT:   CLINICAL IMPRESSION: PT was provided for STM/DTM to the lumbar paraspinals f/b TPDN. Pt then completed lumbopelvic flexibility and abdominal strengthening exs. Pt tolerated the session without adverse effects. Will assess pt's response to the introduction of TPDN the next PT session, Mod TLorraine Laxwas added to the pt's HEP. Pt will continue to benefit from skilled PT to address deficits to optimize trunk function with less pain.   OBJECTIVE IMPAIRMENTS decreased balance, difficulty walking, decreased strength, dizziness, increased muscle spasms, obesity, and pain.    ACTIVITY LIMITATIONS cleaning, community activity, driving, meal prep, occupation, laundry, and shopping.    PERSONAL FACTORS Fitness, Past/current experiences, Time since onset of injury/illness/exacerbation, and 3+ comorbidities:    Significant hx of heart issues, reports balance issues related to BP dropping with standing, Obesity, OA knees and hips are also affecting patient's functional outcome.    REHAB POTENTIAL: Good   CLINICAL DECISION MAKING: Stable/uncomplicated   EVALUATION COMPLEXITY: Low     GOALS:   SHORT TERM GOALS: Target date: 11/13/2021     Pt will be Ind in an initial HEP Baseline:started on eval Status 11/11/21- performs seated version of HEP Goal status: Met   2.  Pt will voice understanding of measures to assist with the decrease and management of back pain Baseline:  Goal status: Met     LONG TERM GOALS: Target date: 12/12/21   Increase pt's core strength and hip strength to 4+/5  for improved function. 11/18/21: add and ER improved. See flow sheets Baseline: 4/5 Goal status: partially met   2.  Pt will report a decrease in back pain to 6/10 or less intermittently with daily activities Baseline: 6-10/10 Goal status: INITIAL   3.  Pt will be able to tolerate walking for 20 mins before needing to sitt down  Baseline: 7 min Goal status: INITIAL   4.  Pt will  improve her 5xSTS time by the MCID of 4 sec as indication of improved function Baseline: TBA Goal status: INITIAL   5.  Pt will improve her 2MWT distance by the MCID of [id number] indication of improved function Baseline: TBA Goal status: INITIAL   6.  Pt will be Ind in a final HEP to maintain achieved LOF Baseline: started on eval Goal status: INITIAL   7.  Pt's Mod Oswestry score will improve to 28% as indication of improved pain and function Baseline: started on eval Goal status: INITIAL     PLAN: PT FREQUENCY: 2x/week   PT DURATION: 6 weeks   PLANNED INTERVENTIONS: Therapeutic exercises, Therapeutic activity, Balance training, Gait training, Patient/Family education, Joint mobilization, Stair training, Aquatic Therapy, Dry Needling, Electrical stimulation, Spinal manipulation, Spinal mobilization, Cryotherapy, Moist heat, Taping,  Traction, Ultrasound, Ionotophoresis 77m/ml Dexamethasone, and Manual therapy.   PLAN FOR NEXT SESSION: assess response to HEP, progress as able ,check BP at start of session.  pt has dizziness in standing (BP drops)  - has abdominal binder she plans to wear to assist in BP . (No restrictions per cardiologists) . Recheck trunk movements. ROM and %xSTS. Discussed possible completion of TPDM to low back. Begin assessment of LTGs  Alexiss Iturralde MS, PT 11/25/21 5:55 AM

## 2021-11-27 ENCOUNTER — Ambulatory Visit: Payer: Medicaid Other | Admitting: Physical Therapy

## 2021-11-27 ENCOUNTER — Encounter: Payer: Self-pay | Admitting: Physical Therapy

## 2021-11-27 DIAGNOSIS — M62830 Muscle spasm of back: Secondary | ICD-10-CM | POA: Diagnosis not present

## 2021-11-27 DIAGNOSIS — R262 Difficulty in walking, not elsewhere classified: Secondary | ICD-10-CM

## 2021-11-27 DIAGNOSIS — M5459 Other low back pain: Secondary | ICD-10-CM | POA: Diagnosis not present

## 2021-11-27 NOTE — Therapy (Signed)
OUTPATIENT PHYSICAL THERAPY TREATMENT NOTE   Patient Name: Maria Hudson MRN: [id number] DOB:Apr 02, 1961, 61 y.o., female Today's Date: 11/27/2021 PCP: Nolene Ebbs, MD   REFERRING PROVIDER: Gentry Fitz, MD  END OF SESSION:   PT End of Session - 11/27/21 0805     Visit Number 9    Number of Visits 13    Date for PT Re-Evaluation 12/12/21    Authorization Type [place] MEDICAID [facility]    Authorization Time Period 8    Authorization - Visit Number 8    Progress Note Due on Visit 10    PT Start Time 0800    PT Stop Time 0838    PT Time Calculation (min) 38 min                Past Medical History:  Diagnosis Date   Acute on chronic diastolic heart failure ([place]) 03/05/2021   Anemia    Arthritis    CAD (coronary artery disease)    CHF (congestive heart failure) (HCC)    CKD (chronic kidney disease) stage 4, GFR 15-29 ml/min (HCC) 03/05/2021   Colon polyps    CVA (cerebral vascular accident) ([place])    Depression    Diabetes mellitus without complication ([place])    Hypertension    Hypertensive urgency 04/14/2021   OSA (obstructive sleep apnea) 05/27/2021   Pneumonia    Pure hypercholesterolemia 05/27/2021   Renal disorder    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   Sleep apnea    Stroke [facility])    Past Surgical History:  Procedure Laterality Date   ABDOMINAL HYSTERECTOMY     BIOPSY  09/08/2021   Procedure: BIOPSY;  Surgeon: Doran Stabler, MD;  Location: [facility];  Service: Gastroenterology;;   CESAREAN SECTION     ESOPHAGOGASTRODUODENOSCOPY (EGD) WITH PROPOFOL N/A 09/08/2021   Procedure: ESOPHAGOGASTRODUODENOSCOPY (EGD) WITH PROPOFOL;  Surgeon: Doran Stabler, MD;  Location: [facility];  Service: Gastroenterology;  Laterality: N/A;  chest pain, epigastric pain   INNER EAR SURGERY Bilateral    RIGHT HEART CATH N/A 05/14/2021   Procedure: RIGHT HEART CATH;  Surgeon: Larey Dresser, MD;  Location: [place] [facility];  Service: Cardiovascular;   Laterality: N/A;   TONSILLECTOMY     age 67   Patient Active Problem List   Diagnosis Date Noted   Pure hypercholesterolemia 05/27/2021   Chronic diastolic CHF (congestive heart failure) ([place]) 04/29/2021   Hypertensive urgency 04/14/2021   Acute on chronic diastolic CHF (congestive heart failure) ([place]) 04/14/2021   Chest pain 03/05/2021   CAD (coronary artery disease) 03/05/2021   T2DM (type 2 diabetes mellitus) ([place]) 03/05/2021   CKD (chronic kidney disease) stage 4, GFR 15-29 ml/min (HCC) 03/05/2021   Class 3 obesity ([place]) 03/05/2021   CVA (cerebral vascular accident) ([place]) 03/05/2021   Acute on chronic diastolic heart failure ([place]) 03/05/2021   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   AKI (acute kidney injury) ([place]) 03/05/2021   Vitreous hemorrhage of left eye ([place]) 11/14/2020   Orthopnea 08/06/2020   Vision loss of right eye 08/06/2020   Excessive daytime sleepiness 01/11/2020   Iron deficiency anemia 12/14/2019   Vitreous hemorrhage, right ([place]) 12/04/2019   Diabetic neuropathy ([place]) 07/26/2019   Proliferative diabetic retinopathy of both 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 ([place]) 02/03/2019   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   Conductive hearing loss, bilateral 03/03/2018   Chronic bilateral low back pain without sciatica 08/21/2015   Chronic kidney disease (CKD) stage G3b/A3,  moderately decreased glomerular filtration rate (GFR) between 30-44 mL/min/1.73 square meter and albuminuria creatinine ratio greater than 300 mg/g (HCC) 04/30/2015   Type 2 diabetes mellitus with diabetic polyneuropathy, with long-term current use of insulin ([place]) 02/15/2015   Advance directive on file 05/17/2014   Refusal of blood transfusions as patient is Jehovah's Witness 05/17/2014   OSA treated with BiPAP 02/23/2014   Coronary artery disease 07/07/2013   Severe obesity (BMI >= 40) ([place]) 05/18/2013   Neuropathy 91/63/8466   Diastolic heart failure ([place]) 10/16/2012    Essential hypertension 10/16/2012   REFERRING DIAG: Low back pain and SI dysfunction   THERAPY DIAG:  Other low back pain  Muscle spasm of back  Difficulty in walking, not elsewhere classified  PERTINENT HISTORY: Significant hx of heart issues, reports balance issues related to BP dropping with standing, Obesity, OA knees and hips   PRECAUTIONS: None  SUBJECTIVE: Pt reports she started having increased pain last Friday and has been in pain since. Her pain level is 8/10 and located in lumbar and thoracic paraspinals. Pt reports she is able to tolerate walking longer distances. Pt reports her body fluid is about the same as her last visit.  PAIN:  Are you having pain? Yes: NPRS scale: 8/10 Pain location: low back Pain description: ache, throb Aggravating factors: Prolonged sitting, standing, and walking Relieving factors: Rest, pain meds as needed           6-9/10 pain range   OBJECTIVE: (objective measures completed at initial evaluation unless otherwise dated)   DIAGNOSTIC FINDINGS:  Not available in EPIC   PATIENT SURVEYS:  Modified Oswestry 40%    SCREENING FOR RED FLAGS: Bowel or bladder incontinence: No Cauda equina syndrome: No   COGNITION:           Overall cognitive status: Within functional limits for tasks assessed                          SENSATION: WFL   MUSCLE LENGTH: Hamstrings: Right WNLs deg; Left WNLs deg Marcello Moores test: Right WNLs deg; Left WNLs deg   POSTURE:  Forward head, rounded shoulder, decreased kyphosis, increased lordosis   PALPATION: TTP midline lower vertebrae, bilat upper gluteal   LUMBAR ROM:    Active  A/PROM  10/21/2021  Flexion Full No pain  Extension Full pressure LB pain mod  Right lateral flexion Full  R LB pain, mod  Left lateral flexion Full L LB pain, min  Right rotation Full R LB pain, mod  Left rotation Full LB pain, min   (Blank rows = not tested)   LE ROM:                       Grossly WNLs Active   Right 10/21/2021 Left 10/21/2021  Hip flexion      Hip extension      Hip abduction      Hip adduction      Hip internal rotation      Hip external rotation      Knee flexion      Knee extension      Ankle dorsiflexion      Ankle plantarflexion      Ankle inversion      Ankle eversion       (Blank rows = not tested)   LE MMT:  Myotomes for legs are equal with no overt weakness, but with decreased strength of the hips. Weak core. MMT Right 10/21/2021 Left 10/21/2021 Rt 11/18/21 LT 11/18/21  Hip flexion _0 Hip extension _1 Hip abduction _2 Hip adduction 4 4 4+ 4+  Hip internal rotation        Hip external rotation        Knee flexion        Knee extension        Ankle dorsiflexion        Ankle plantarflexion        Ankle inversion        Ankle eversion         (Blank rows = not tested)   LUMBAR SPECIAL TESTS:  Straight leg raise test: Negative and Slump test: Negative   FUNCTIONAL TESTS:  5 times sit to stand: 10/29/21 19.1 sec (hands on thighs) 2 minute walk test: 10/29/21 200 feet (one standing rest break. Frequent staggering due to dizziness).    GAIT: Distance walked: 249f Assistive device utilized: None Level of assistance: Complete Independence Comments: Decreased pace, decreased trunk rotation, valgus bilat legs   TODAY'S TREATMENT  [facility] Adult PT Treatment:                                                DATE: 11/27/21 Therapeutic Exercise: Nustep L5 x 3 minutes- discontinued due to pain  Seated childs pose with ball forward and laterals Seated lumbar flexion  Seated side bends  Seated trunk rotations Hookying LTR Hamstring stretch with strap x 3 bialteral   Modalities: HMP  x 10 minutes  seated to mid back    [facility]                                                [facility]: [facility] Therapeutic Exercise: LTR x5 5 sec  Mod thomas stretch x2 60' each Trunk flexion c swiss ball forward and laterally x2 each  20" PPT hand slides on thighs to engage core x 10 for 5 sec x 2 sets Marching c PPT 2x10 Trunk flexion c swiss ball forward and laterally x2 each 20"  Manual Therapy: STM and DTM to the bilat paraspinals  Trigger Point Dry Needling Treatment: Skilled palpation for TrP and taut muscleSTM and DTm ID and  Pre-treatment instruction: Patient instructed on dry needling rationale, procedures, and possible side effects including pain during treatment (achy,cramping feeling), bruising, drop of blood, lightheadedness, nausea, sweating. Patient Consent Given: Yes Education handout provided: Yes Muscles treated: Lumbar paraspinals  Needle size and number: .[id number] 3 Electrical stimulation performed: No Parameters: N/A Treatment response/outcome: Twitch response elicited and Palpable decrease in muscle tension Post-treatment instructions: Patient instructed to expect possible mild to moderate muscle soreness later today and/or tomorrow. Patient instructed in methods to reduce muscle soreness and to continue prescribed HEP. If patient was dry needled over the lung field, patient was instructed on signs and symptoms of pneumothorax and, however unlikely, to see immediate medical attention should they occur. Patient was also educated on signs and symptoms of infection and to seek medical attention should they occur. Patient verbalized  understanding of these instructions and education.   [facility] Adult PT Treatment:                                                DATE: 11/18/21 Therapeutic Exercise: Nustep 29mn L6 UE/LE 96% SPO2. Pt reports light DOE. Trunk flexion c swiss ball forward and laterally x2 each 20" PPT hand slides on thighs to engage core x 10 for 5 sec x 2 sets Marching c PPT 2x10 Bridge 2x10 5" Hip clams 2x10 PPT hand slides on thighs to engage core x 10 for 5 sec x 2 sets Bridge 10 x 2 5" Seated ROW 35# x 10 x 2 Seated lat pull down 20 # 10 x 2 Leg press 45# 10 x 2  MMT both LEs  [facility]  Adult PT Treatment:                                                DATE: 11/13/21 Therapeutic Exercise: Resting BP- 152/78  Nustep 665m L5 UE/LE 95% SPO2 Seated ROW 25# x 10 x 2 Seated lat pull down 20 # 10 x 2 Leg press 45# 10 x 2   PPT hand slides on thighs to engage core x 10 for 5 sec x 2 sets Bridge 10 x 2 5" Figure 4 pull 20 sec x 2 each Seated clam blue band 5 sec hold 10 x 2  Seated Marching c core engagement 2 sets of 10   PATIENT EDUCATION:  Education details: eval findings, POC, HEP Person educated: Patient Education method: Explanation, Demonstration, Tactile cues, Verbal cues, and Handouts Education comprehension: verbalized understanding, returned demonstration, verbal cues required, and tactile cues required     HOME EXERCISE PROGRAM:    ASSESSMENT:   CLINICAL IMPRESSION: Pt reports increased pain starting Friday of last week that has not resolved. She canceled her last appointment and had difficulty participating today due to increased pain. She is unable to tolerate light touch today. HMP was provided at end of session to assist with pain reduction.   PT was provided for STM/DTM to the lumbar paraspinals f/b TPDN. Pt then completed lumbopelvic flexibility and abdominal strengthening exs. Pt tolerated the session without adverse effects. Will assess pt's response to the introduction of TPDN the next PT session, Mod ThLorraine Laxas added to the pt's HEP. Pt will continue to benefit from skilled PT to address deficits to optimize trunk function with less pain.   OBJECTIVE IMPAIRMENTS decreased balance, difficulty walking, decreased strength, dizziness, increased muscle spasms, obesity, and pain.    ACTIVITY LIMITATIONS cleaning, community activity, driving, meal prep, occupation, laundry, and shopping.    PERSONAL FACTORS Fitness, Past/current experiences, Time since onset of injury/illness/exacerbation, and 3+ comorbidities:    Significant hx of heart issues,  reports balance issues related to BP dropping with standing, Obesity, OA knees and hips are also affecting patient's functional outcome.    REHAB POTENTIAL: Good   CLINICAL DECISION MAKING: Stable/uncomplicated   EVALUATION COMPLEXITY: Low     GOALS:   SHORT TERM GOALS: Target date: 11/13/2021     Pt will be Ind in an initial HEP Baseline:started on eval Status 11/11/21- performs seated version of HEP Goal status: Met   2.  Pt will voice understanding of measures to assist with the decrease and management of back pain Baseline:  Goal status: Met     LONG TERM GOALS: Target date: 12/12/21   Increase pt's core strength and hip strength to 4+/5  for improved function. 11/18/21: add and ER improved. See flow sheets Baseline: 4/5 Goal status: partially met   2.  Pt will report a decrease in back pain to 6/10 or less intermittently with daily activities Baseline: 6-10/10 Goal status: INITIAL   3.  Pt will be able to tolerate walking for 20 mins before needing to sitt down  Baseline: 7 min Goal status: INITIAL   4.  Pt will improve her 5xSTS time by the MCID of 4 sec as indication of improved function Baseline: TBA Goal status: INITIAL   5.  Pt will improve her 2MWT distance by the MCID of 74f as indication of improved function Baseline: TBA Goal status: INITIAL   6.  Pt will be Ind in a final HEP to maintain achieved LOF Baseline: started on eval Goal status: INITIAL   7.  Pt's Mod Oswestry score will improve to 28% as indication of improved pain and function Baseline: started on eval Goal status: INITIAL     PLAN: PT FREQUENCY: 2x/week   PT DURATION: 6 weeks   PLANNED INTERVENTIONS: Therapeutic exercises, Therapeutic activity, Balance training, Gait training, Patient/Family education, Joint mobilization, Stair training, Aquatic Therapy, Dry Needling, Electrical stimulation, Spinal manipulation, Spinal mobilization, Cryotherapy, Moist heat, Taping, Traction,  Ultrasound, Ionotophoresis 466mml Dexamethasone, and Manual therapy.   PLAN FOR NEXT SESSION: has her pain improved since last session? assess response to HEP, progress as able ,check BP at start of session.  pt has dizziness in standing (BP drops)  - has abdominal binder she plans to wear to assist in BP . (No restrictions per cardiologists) . Recheck trunk movements. ROM and %xSTS. Discussed possible completion of TPDM to low back. Begin assessment of LTGs  JeHessie DienerPTA 11/27/21 8:46 AM Phone: [phone]: [phone]

## 2021-12-02 ENCOUNTER — Ambulatory Visit: Payer: Medicaid Other

## 2021-12-02 ENCOUNTER — Other Ambulatory Visit (HOSPITAL_COMMUNITY): Payer: Self-pay | Admitting: Family Medicine

## 2021-12-02 DIAGNOSIS — R262 Difficulty in walking, not elsewhere classified: Secondary | ICD-10-CM | POA: Diagnosis not present

## 2021-12-02 DIAGNOSIS — M62830 Muscle spasm of back: Secondary | ICD-10-CM

## 2021-12-02 DIAGNOSIS — M5459 Other low back pain: Secondary | ICD-10-CM

## 2021-12-02 DIAGNOSIS — I509 Heart failure, unspecified: Secondary | ICD-10-CM | POA: Diagnosis not present

## 2021-12-02 NOTE — Therapy (Signed)
OUTPATIENT PHYSICAL THERAPY TREATMENT NOTE/Progress Note/Discharge   Patient Name: Maria Hudson MRN: [id number] DOB:04/02/1961, 61 y.o., female Today's Date: 12/02/2021 PCP: Nolene Ebbs, MD   REFERRING PROVIDER: Gentry Fitz, MD  Progress Note Reporting Period 10/22/21 to 12/02/21  See note below for Objective Data and Assessment of Progress/Goals.      END OF SESSION:   PT End of Session - 12/02/21 0814     Visit Number 10    Number of Visits --    Date for PT Re-Evaluation 12/12/21    Authorization Type [place] MEDICAID [facility]    Authorization - Number of Visits 10    Progress Note Due on Visit 10    PT Start Time 0806    PT Stop Time 0850    PT Time Calculation (min) 44 min    Activity Tolerance Patient tolerated treatment well    Behavior During Therapy [facility] for tasks assessed/performed                 Past Medical History:  Diagnosis Date   Acute on chronic diastolic heart failure ([place]) 03/05/2021   Anemia    Arthritis    CAD (coronary artery disease)    CHF (congestive heart failure) (HCC)    CKD (chronic kidney disease) stage 4, GFR 15-29 ml/min (HCC) 03/05/2021   Colon polyps    CVA (cerebral vascular accident) ([place])    Depression    Diabetes mellitus without complication ([place])    Hypertension    Hypertensive urgency 04/14/2021   OSA (obstructive sleep apnea) 05/27/2021   Pneumonia    Pure hypercholesterolemia 05/27/2021   Renal disorder    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   Sleep apnea    Stroke [facility])    Past Surgical History:  Procedure Laterality Date   ABDOMINAL HYSTERECTOMY     BIOPSY  09/08/2021   Procedure: BIOPSY;  Surgeon: Doran Stabler, MD;  Location: [facility] ENDOSCOPY;  Service: Gastroenterology;;   CESAREAN SECTION     ESOPHAGOGASTRODUODENOSCOPY (EGD) WITH PROPOFOL N/A 09/08/2021   Procedure: ESOPHAGOGASTRODUODENOSCOPY (EGD) WITH PROPOFOL;  Surgeon: Doran Stabler, MD;  Location: [facility];  Service:  Gastroenterology;  Laterality: N/A;  chest pain, epigastric pain   INNER EAR SURGERY Bilateral    RIGHT HEART CATH N/A 05/14/2021   Procedure: RIGHT HEART CATH;  Surgeon: Larey Dresser, MD;  Location: [place] CV LAB;  Service: Cardiovascular;  Laterality: N/A;   TONSILLECTOMY     age 43   Patient Active Problem List   Diagnosis Date Noted   Pure hypercholesterolemia 05/27/2021   Chronic diastolic CHF (congestive heart failure) ([place]) 04/29/2021   Hypertensive urgency 04/14/2021   Acute on chronic diastolic CHF (congestive heart failure) ([place]) 04/14/2021   Chest pain 03/05/2021   CAD (coronary artery disease) 03/05/2021   T2DM (type 2 diabetes mellitus) ([place]) 03/05/2021   CKD (chronic kidney disease) stage 4, GFR 15-29 ml/min ([place]) 03/05/2021   Class 3 obesity ([place]) 03/05/2021   CVA (cerebral vascular accident) ([place]) 03/05/2021   Acute on chronic diastolic heart failure ([place]) 03/05/2021   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   AKI (acute kidney injury) ([place]) 03/05/2021   Vitreous hemorrhage of left eye ([place]) 11/14/2020   Orthopnea 08/06/2020   Vision loss of right eye 08/06/2020   Excessive daytime sleepiness 01/11/2020   Iron deficiency anemia 12/14/2019   Vitreous hemorrhage, right ([place]) 12/04/2019   Diabetic neuropathy ([place]) 07/26/2019   Proliferative diabetic retinopathy of both eyes with macular edema associated with  type 2 diabetes mellitus ([place]) 02/03/2019   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   Conductive hearing loss, bilateral 03/03/2018   Chronic bilateral low back pain without sciatica 08/21/2015   Chronic kidney disease (CKD) stage G3b/A3, moderately decreased glomerular filtration rate (GFR) between 30-44 mL/min/1.73 square meter and albuminuria creatinine ratio greater than 300 mg/g (HCC) 04/30/2015   Type 2 diabetes mellitus with diabetic polyneuropathy, with long-term current use of insulin ([place]) 02/15/2015   Advance directive on file 05/17/2014   Refusal of  blood transfusions as patient is Jehovah's Witness 05/17/2014   OSA treated with BiPAP 02/23/2014   Coronary artery disease 07/07/2013   Severe obesity (BMI >= 40) ([place]) 05/18/2013   Neuropathy 53/97/6734   Diastolic heart failure ([place]) 10/16/2012   Essential hypertension 10/16/2012   REFERRING DIAG: Low back pain and SI dysfunction   THERAPY DIAG:  Other low back pain  Muscle spasm of back  Difficulty in walking, not elsewhere classified  PERTINENT HISTORY: Significant hx of heart issues, reports balance issues related to BP dropping with standing, Obesity, OA knees and hips   PRECAUTIONS: None  SUBJECTIVE: Pt reports her low back is feeling better. Overall, pt notes she is the tolerate walking and standing better and she is able to better manage her pain.  PAIN:  Are you having pain? Yes: NPRS scale: 5/10 Pain location: low back Pain description: ache, throb Aggravating factors: Prolonged sitting, standing, and walking Relieving factors: Rest, pain meds as needed           5-9/10 pain range   OBJECTIVE: (objective measures completed at initial evaluation unless otherwise dated)   DIAGNOSTIC FINDINGS:  Not available in EPIC   PATIENT SURVEYS:  Modified Oswestry 40%    SCREENING FOR RED FLAGS: Bowel or bladder incontinence: No Cauda equina syndrome: No   COGNITION:           Overall cognitive status: Within functional limits for tasks assessed                          SENSATION: WFL   MUSCLE LENGTH: Hamstrings: Right WNLs deg; Left WNLs deg Marcello Moores test: Right WNLs deg; Left WNLs deg   POSTURE:  Forward head, rounded shoulder, decreased kyphosis, increased lordosis   PALPATION: TTP midline lower vertebrae, bilat upper gluteal   LUMBAR ROM:    Active  A/PROM  10/21/2021  Flexion Full No pain  Extension Full pressure LB pain mod  Right lateral flexion Full  R LB pain, mod  Left lateral flexion Full L LB pain, min  Right rotation Full R LB pain, mod   Left rotation Full LB pain, min   (Blank rows = not tested)   LE ROM:                       Grossly WNLs Active  Right 10/21/2021 Left 10/21/2021  Hip flexion      Hip extension      Hip abduction      Hip adduction      Hip internal rotation      Hip external rotation      Knee flexion      Knee extension      Ankle dorsiflexion      Ankle plantarflexion      Ankle inversion      Ankle eversion       (Blank rows = not tested)   LE MMT:  Myotomes for legs are equal with no overt weakness, but with decreased strength of the hips. Weak core. MMT Right 10/21/2021 Left 10/21/2021 Rt 11/18/21 LT 11/18/21 RT LT 03/04/22  Hip flexion '4 4 4 4 4 4  ' Hip extension '4 4 4 4 ' 4+ 4+  Hip abduction '4 4 4 4 ' 4+ 4+  Hip adduction 4 4 4+ 4+ 4+ 4+  Hip internal rotation       4+ 4+  Hip external rotation       4+ 4+  Knee flexion          Knee extension          Ankle dorsiflexion          Ankle plantarflexion          Ankle inversion          Ankle eversion           (Blank rows = not tested)   LUMBAR SPECIAL TESTS:  Straight leg raise test: Negative and Slump test: Negative   FUNCTIONAL TESTS:  5 times sit to stand: 10/29/21 19.1 sec (hands on thighs) 2 minute walk test: 10/29/21 200 feet (one standing rest break. Frequent staggering due to dizziness).    GAIT: Distance walked: 242f Assistive device utilized: None Level of assistance: Complete Independence Comments: Decreased pace, decreased trunk rotation, valgus bilat legs   TODAY'S TREATMENT  [facility] Adult PT Treatment:                                                DATE: 12/02/21 Therapeutic Exercise: Nustep L5 x 6 minutes 2MWT 5xSTS LTR x5 5 sec  Mod thomas stretch x2 60' each Trunk flexion c swiss ball forward and laterally x2 each 20" PPT hand slides on thighs to engage core x10 for 3sec x Bridging x10 Standing hip flexion stretch3" Supine hip abd x10 3 " GTB Supine hip add x10 5" Review of HEP  [facility]  Adult PT Treatment:                                                DATE: 11/27/21 Therapeutic Exercise: Nustep L5 x 3 minutes- discontinued due to pain  Seated childs pose with ball forward and laterals Seated lumbar flexion  Seated side bends  Seated trunk rotations Hookying LTR Hamstring stretch with strap x 3 bialteral   Modalities: HMP  x 10 minutes  seated to mid back    [facility]                                                [facility]: [facility] Therapeutic Exercise: LTR x5 5 sec  Mod thomas stretch x2 60' each Trunk flexion c swiss ball forward and laterally x2 each 20" PPT hand slides on thighs to engage core x 10 for 5 sec x 2 sets Marching c PPT 2x10 Trunk flexion c swiss ball forward and laterally x2 each 20"  Manual Therapy: STM and DTM to the bilat paraspinals  Trigger Point Dry Needling Treatment: Skilled palpation for TrP and taut muscleSTM and  DTm ID and  Pre-treatment instruction: Patient instructed on dry needling rationale, procedures, and possible side effects including pain during treatment (achy,cramping feeling), bruising, drop of blood, lightheadedness, nausea, sweating. Patient Consent Given: Yes Education handout provided: Yes Muscles treated: Lumbar paraspinals  Needle size and number: .30x86m x 3 Electrical stimulation performed: No Parameters: N/A Treatment response/outcome: Twitch response elicited and Palpable decrease in muscle tension Post-treatment instructions: Patient instructed to expect possible mild to moderate muscle soreness later today and/or tomorrow. Patient instructed in methods to reduce muscle soreness and to continue prescribed HEP. If patient was dry needled over the lung field, patient was instructed on signs and symptoms of pneumothorax and, however unlikely, to see immediate medical attention should they occur. Patient was also educated on signs and symptoms of infection and to seek medical attention should they occur.  Patient verbalized understanding of these instructions and education.   PATIENT EDUCATION:  Education details: eval findings, POC, HEP Person educated: Patient Education method: Explanation, Demonstration, Tactile cues, Verbal cues, and Handouts Education comprehension: verbalized understanding, returned demonstration, verbal cues required, and tactile cues required     HOME EXERCISE PROGRAM: Access Code: [email or web address]: [email or web address] Date: 12/02/2021 Prepared by: AGar Ponto Exercises - Supine Lower Trunk Rotation  - 2 x daily - 7 x weekly - 1 sets - 10 reps - 5 hold - Supine Posterior Pelvic Tilt  - 2 x daily - 7 x weekly - 2 sets - 10 reps - 5 hold - Hooklying Single Knee to Chest Stretch  - 2 x daily - 7 x weekly - 1 sets - 3 reps - 20 hold - Seated Flexion Stretch with Swiss Ball  - 2 x daily - 7 x weekly - 2 sets - 3 reps - 20 hold - Hooklying Clamshell with Resistance  - 2 x daily - 7 x weekly - 1-2 sets - 10 reps - 5 hold - Supine Hip Adduction Isometric with Ball  - 1 x daily - 7 x weekly - 1-2 sets - 10 reps - Supine Bridge  - 1 x daily - 7 x weekly - 2 sets - 5 reps - Hip Flexor Stretch at Edge of Bed  - 2 x daily - 7 x weekly - 1 sets - 2 reps - 30 hold   ASSESSMENT:   CLINICAL IMPRESSION: Pt completed her last PT session today. Pt has made good progress with strength and function, while pain levels are similar. Pt does report she is better able to manage her. Pt is Ind in a final HEP to maintain achieve level of function. Pt is in agreement with DC.   OBJECTIVE IMPAIRMENTS decreased balance, difficulty walking, decreased strength, dizziness, increased muscle spasms, obesity, and pain.    ACTIVITY LIMITATIONS cleaning, community activity, driving, meal prep, occupation, laundry, and shopping.    PERSONAL FACTORS Fitness, Past/current experiences, Time since onset of injury/illness/exacerbation, and 3+ comorbidities:    Significant hx of heart issues,  reports balance issues related to BP dropping with standing, Obesity, OA knees and hips are also affecting patient's functional outcome.    REHAB POTENTIAL: Good   CLINICAL DECISION MAKING: Stable/uncomplicated   EVALUATION COMPLEXITY: Low     GOALS:   SHORT TERM GOALS: Target date: 11/13/2021     Pt will be Ind in an initial HEP Baseline:started on eval Status 11/11/21- performs seated version of HEP Goal status: Met   2.  Pt will voice understanding of measures to assist with the decrease and management  of back pain Baseline:  Goal status: Met     LONG TERM GOALS: Target date: 12/12/21   Increase pt's core strength and hip strength to 4+/5  for improved function. 11/18/21: add and ER improved. See flow sheets. 12/02/21: All improved except hip flex Baseline: 4/5 Goal status: partially met   2.  Pt will report a decrease in back pain to 6/10 or less intermittently with daily activities.12/02/21: 5-9/10 Goal status: Not met   3.  Pt will be able to tolerate walking for 20 mins before needing to sitt down. 12/02/21: A couple of hours with shopping at [facility]   Baseline: 7 min Goal status: Met   4.  Pt will improve her 5xSTS time by the MCID of 4 sec as indication of improved function. ^/20/23: 13.9 Baseline: 19.1 sec Goal status: Met   5.  Pt will improve her 2MWT distance by the MCID of 53f as indication of improved function. 12/02/21+ 374fBaseline: 20073foal status: Met   6.  Pt will be Ind in a final HEP to maintain achieved LOF Baseline: started on eval Goal status: Met   7.  Pt's Mod Oswestry score will improve to 28% as indication of improved pain and function. 12/02/21:38% Baseline: started on eval Goal status: Met     PLAN: PT FREQUENCY: 2x/week   PT DURATION: 6 weeks   PLANNED INTERVENTIONS: Therapeutic exercises, Therapeutic activity, Balance training, Gait training, Patient/Family education, Joint mobilization, Stair training, Aquatic Therapy, Dry  Needling, Electrical stimulation, Spinal manipulation, Spinal mobilization, Cryotherapy, Moist heat, Taping, Traction, Ultrasound, Ionotophoresis 4mg40m Dexamethasone, and Manual therapy.   PLAN FOR NEXT SESSION:   PHYSICAL THERAPY DISCHARGE SUMMARY  Visits from Start of Care: 10  Current functional level related to goals / functional outcomes: See clinical impression and LTGs   Remaining deficits: See clinical impression and LTGs   Education / Equipment: HEP, pain management   Patient agrees to discharge. Patient goals were partially met. Patient is being discharged due to being pleased with the current functional level.   Antonae Zbikowski MS, PT 12/02/21 9:22 AM

## 2021-12-03 LAB — BASIC METABOLIC PANEL
BUN/Creatinine Ratio: 18 (ref 9–23)
BUN: 49 mg/dL — ABNORMAL HIGH (ref 6–24)
CO2: 21 mmol/L (ref 20–29)
Calcium: 9 mg/dL (ref 8.7–10.2)
Chloride: 104 mmol/L (ref 96–106)
Creatinine, Ser: 2.65 mg/dL — ABNORMAL HIGH (ref 0.57–1.00)
Glucose: 77 mg/dL (ref 70–99)
Potassium: 4.2 mmol/L (ref 3.5–5.2)
Sodium: 139 mmol/L (ref 134–144)
eGFR: 20 mL/min/{1.73_m2} — ABNORMAL LOW (ref >59)

## 2021-12-03 LAB — SPECIMEN STATUS REPORT

## 2021-12-04 ENCOUNTER — Ambulatory Visit: Payer: Medicaid Other

## 2021-12-04 ENCOUNTER — Encounter (HOSPITAL_BASED_OUTPATIENT_CLINIC_OR_DEPARTMENT_OTHER): Payer: Self-pay | Admitting: *Deleted

## 2021-12-05 ENCOUNTER — Encounter: Payer: Self-pay | Admitting: Podiatry

## 2021-12-05 ENCOUNTER — Ambulatory Visit: Payer: Medicaid Other | Admitting: Podiatry

## 2021-12-05 DIAGNOSIS — E1142 Type 2 diabetes mellitus with diabetic polyneuropathy: Secondary | ICD-10-CM | POA: Diagnosis not present

## 2021-12-05 DIAGNOSIS — B351 Tinea unguium: Secondary | ICD-10-CM | POA: Insufficient documentation

## 2021-12-05 DIAGNOSIS — N1832 Chronic kidney disease, stage 3b: Secondary | ICD-10-CM | POA: Diagnosis not present

## 2021-12-05 DIAGNOSIS — M79675 Pain in left toe(s): Secondary | ICD-10-CM

## 2021-12-05 DIAGNOSIS — M79674 Pain in right toe(s): Secondary | ICD-10-CM

## 2021-12-05 DIAGNOSIS — N179 Acute kidney failure, unspecified: Secondary | ICD-10-CM | POA: Diagnosis not present

## 2021-12-05 NOTE — Progress Notes (Addendum)
This patient presents to the office with chief complaint of long thick nails and diabetic feet.  This patient  says there  is  no pain and discomfort in their feet.  This patient says there are long thick painful nails.  These nails are painful walking and wearing shoes.  Patient has no history of infection or drainage from both feet.  Patient is unable to  self treat her own nails . This patient presents  to the office today for treatment of the  long nails and a foot evaluation due to history of  diabetes. Patient also has AKI, and CKD.  General Appearance  Alert, conversant and in no acute stress.  Vascular  Dorsalis pedis and posterior tibial  pulses are  weakly palpable  bilaterally.  Capillary return is within normal limits  bilaterally. Temperature is within normal limits  bilaterally. Swollen feet  B/L.  Neurologic  Senn-Weinstein monofilament wire test within normal limits  right.  LOPS diminished left foot.. Muscle power within normal limits bilaterally.  Nails Thick disfigured discolored nails with subungual debris  from hallux to fifth toes bilaterally. No evidence of bacterial infection or drainage bilaterally.  Orthopedic  No limitations of motion of motion feet .  No crepitus or effusions noted.  No bony pathology .  Hammer toes  B/L.  Skin  normotropic skin with no porokeratosis noted bilaterally.  No signs of infections or ulcers noted.     Onychomycosis  Diabetes with no foot complications  IE  Debride nails x 10.  With nail nipper followed by dremel tool. A diabetic foot exam was performed and there is evidence of any vascular or neurologic pathology.   RTC 3 months.   Helane Gunther DPM

## 2021-12-08 ENCOUNTER — Encounter (HOSPITAL_COMMUNITY): Payer: Self-pay | Admitting: Vascular Surgery

## 2021-12-08 NOTE — Progress Notes (Signed)
PCP - Dr Fleet Contras Cardiologist - Dr Chilton Si Endocrinology - Dr Lucretia Field  Chest x-ray - 04/29/21 (2V) EKG - 11/19/21 Stress Test - 11/14/14 ECHO Limited- 04/30/21 Cardiac Cath - 05/14/21  ICD Pacemaker/Loop - n/a  Sleep Study -  Yes, 06/03/21 CPAP - uses CPAP  THE NIGHT BEFORE SURGERY, take 5 Units of Lantus Insulin.  Do not take bedtime dose of Novolog.       THE MORNING OF SURGERY, do not take Novolog Insulin on the morning of surgery unless your CBG is greater than 220 mg/dL.  If CBG greater than 220 mg/dL, you may take 2 units of your Novolog Insulin.  Do not take Ozempic on DOS.    If your blood sugar is less than 70 mg/dL, you will need to treat for low blood sugar: Treat a low blood sugar (less than 70 mg/dL) with  cup of clear juice (cranberry or apple), 4 glucose tablets, OR glucose gel. Recheck blood sugar in 15 minutes after treatment (to make sure it is greater than 70 mg/dL). If your blood sugar is not greater than 70 mg/dL on recheck, call [phone] for further instructions.  Anesthesia review: Yes  STOP now taking any Aspirin (unless otherwise instructed by your surgeon), Aleve, Naproxen, Ibuprofen, Motrin, Advil, Goody's, BC's, all herbal medications, fish oil, and all vitamins.   Coronavirus Screening Do you have any of the following symptoms:  Cough yes/no: No Fever (>100.25F)  yes/no: No Runny nose yes/no: No Sore throat yes/no: No Difficulty breathing/shortness of breath  yes/no: No  Have you traveled in the last 14 days and where? yes/no: No  Patient verbalized understanding of instructions that were given via phone.

## 2021-12-08 NOTE — Anesthesia Preprocedure Evaluation (Addendum)
Anesthesia Evaluation  Patient identified by MRN, date of birth, ID band Patient awake    Reviewed: Allergy & Precautions, NPO status , Patient's Chart, lab work & pertinent test results, reviewed documented beta blocker date and time   Airway Mallampati: III  TM Distance: >3 FB Neck ROM: Full    Dental  (+) Dental Advisory Given, Missing, Chipped,    Pulmonary sleep apnea and Continuous Positive Airway Pressure Ventilation ,    Pulmonary exam normal breath sounds clear to auscultation       Cardiovascular hypertension, Pt. on home beta blockers and Pt. on medications (-) angina+ CAD, + Past MI, +CHF and + Orthopnea  Normal cardiovascular exam Rhythm:Regular Rate:Normal     Neuro/Psych PSYCHIATRIC DISORDERS Depression  Neuromuscular disease CVA, Residual Symptoms    GI/Hepatic negative GI ROS, Neg liver ROS,   Endo/Other  diabetes, Type 2, Insulin DependentObesity   Renal/GU ESRFRenal diseaseK+ 4.1     Musculoskeletal  (+) Arthritis ,   Abdominal   Peds  Hematology  (+) Blood dyscrasia, anemia ,   Anesthesia Other Findings Day of surgery medications reviewed with the patient.  Reproductive/Obstetrics                           Anesthesia Physical Anesthesia Plan  ASA: 3  Anesthesia Plan: Regional and MAC   Post-op Pain Management: Tylenol PO (pre-op)*   Induction: Intravenous  PONV Risk Score and Plan: 2 and Midazolam, Treatment may vary due to age or medical condition and TIVA  Airway Management Planned: Nasal Cannula and Natural Airway  Additional Equipment:   Intra-op Plan:   Post-operative Plan:   Informed Consent: I have reviewed the patients History and Physical, chart, labs and discussed the procedure including the risks, benefits and alternatives for the proposed anesthesia with the patient or authorized representative who has indicated his/her understanding and  acceptance.     Dental advisory given  Plan Discussed with:   Anesthesia Plan Comments: (PAT note written 12/08/2021 by Shonna Chock, PA-C. )       Anesthesia Quick Evaluation

## 2021-12-09 ENCOUNTER — Telehealth: Payer: Self-pay

## 2021-12-09 ENCOUNTER — Ambulatory Visit (HOSPITAL_BASED_OUTPATIENT_CLINIC_OR_DEPARTMENT_OTHER): Payer: Medicaid Other | Admitting: Vascular Surgery

## 2021-12-09 ENCOUNTER — Ambulatory Visit (HOSPITAL_COMMUNITY)
Admission: RE | Admit: 2021-12-09 | Discharge: 2021-12-09 | Disposition: A | Discharge status: Home / Self Care | Payer: Medicaid Other | Attending: Vascular Surgery | Admitting: Vascular Surgery

## 2021-12-09 ENCOUNTER — Other Ambulatory Visit: Payer: Self-pay

## 2021-12-09 ENCOUNTER — Encounter (HOSPITAL_COMMUNITY): Payer: Self-pay | Admitting: Vascular Surgery

## 2021-12-09 ENCOUNTER — Ambulatory Visit (HOSPITAL_COMMUNITY): Payer: Medicaid Other | Admitting: Vascular Surgery

## 2021-12-09 ENCOUNTER — Encounter (HOSPITAL_COMMUNITY)
Admission: RE | Disposition: A | Discharge status: Home / Self Care | Payer: Self-pay | Source: Home / Self Care | Attending: Vascular Surgery

## 2021-12-09 DIAGNOSIS — D649 Anemia, unspecified: Secondary | ICD-10-CM | POA: Diagnosis not present

## 2021-12-09 DIAGNOSIS — E1122 Type 2 diabetes mellitus with diabetic chronic kidney disease: Secondary | ICD-10-CM | POA: Diagnosis not present

## 2021-12-09 DIAGNOSIS — N186 End stage renal disease: Secondary | ICD-10-CM | POA: Diagnosis not present

## 2021-12-09 DIAGNOSIS — I5032 Chronic diastolic (congestive) heart failure: Secondary | ICD-10-CM | POA: Insufficient documentation

## 2021-12-09 DIAGNOSIS — I2510 Atherosclerotic heart disease of native coronary artery without angina pectoris: Secondary | ICD-10-CM | POA: Diagnosis not present

## 2021-12-09 DIAGNOSIS — Z6839 Body mass index (BMI) 39.0-39.9, adult: Secondary | ICD-10-CM | POA: Insufficient documentation

## 2021-12-09 DIAGNOSIS — I252 Old myocardial infarction: Secondary | ICD-10-CM | POA: Diagnosis not present

## 2021-12-09 DIAGNOSIS — R0601 Orthopnea: Secondary | ICD-10-CM | POA: Insufficient documentation

## 2021-12-09 DIAGNOSIS — I132 Hypertensive heart and chronic kidney disease with heart failure and with stage 5 chronic kidney disease, or end stage renal disease: Secondary | ICD-10-CM | POA: Diagnosis not present

## 2021-12-09 DIAGNOSIS — Z992 Dependence on renal dialysis: Secondary | ICD-10-CM

## 2021-12-09 DIAGNOSIS — N185 Chronic kidney disease, stage 5: Secondary | ICD-10-CM | POA: Diagnosis not present

## 2021-12-09 DIAGNOSIS — I6990 Unspecified sequelae of unspecified cerebrovascular disease: Secondary | ICD-10-CM | POA: Insufficient documentation

## 2021-12-09 DIAGNOSIS — Z794 Long term (current) use of insulin: Secondary | ICD-10-CM | POA: Insufficient documentation

## 2021-12-09 DIAGNOSIS — M109 Gout, unspecified: Secondary | ICD-10-CM | POA: Diagnosis not present

## 2021-12-09 DIAGNOSIS — M1990 Unspecified osteoarthritis, unspecified site: Secondary | ICD-10-CM | POA: Diagnosis not present

## 2021-12-09 DIAGNOSIS — D631 Anemia in chronic kidney disease: Secondary | ICD-10-CM | POA: Diagnosis not present

## 2021-12-09 DIAGNOSIS — D759 Disease of blood and blood-forming organs, unspecified: Secondary | ICD-10-CM | POA: Insufficient documentation

## 2021-12-09 DIAGNOSIS — N184 Chronic kidney disease, stage 4 (severe): Secondary | ICD-10-CM | POA: Diagnosis not present

## 2021-12-09 DIAGNOSIS — Z7952 Long term (current) use of systemic steroids: Secondary | ICD-10-CM | POA: Insufficient documentation

## 2021-12-09 DIAGNOSIS — E7800 Pure hypercholesterolemia, unspecified: Secondary | ICD-10-CM | POA: Diagnosis not present

## 2021-12-09 DIAGNOSIS — E669 Obesity, unspecified: Secondary | ICD-10-CM | POA: Diagnosis not present

## 2021-12-09 DIAGNOSIS — F32A Depression, unspecified: Secondary | ICD-10-CM | POA: Insufficient documentation

## 2021-12-09 DIAGNOSIS — I509 Heart failure, unspecified: Secondary | ICD-10-CM

## 2021-12-09 DIAGNOSIS — I130 Hypertensive heart and chronic kidney disease with heart failure and stage 1 through stage 4 chronic kidney disease, or unspecified chronic kidney disease: Secondary | ICD-10-CM

## 2021-12-09 DIAGNOSIS — N1832 Chronic kidney disease, stage 3b: Secondary | ICD-10-CM

## 2021-12-09 DIAGNOSIS — G4733 Obstructive sleep apnea (adult) (pediatric): Secondary | ICD-10-CM | POA: Diagnosis not present

## 2021-12-09 HISTORY — DX: Other amnesia: R41.3

## 2021-12-09 HISTORY — DX: Dyspnea, unspecified: R06.00

## 2021-12-09 HISTORY — PX: AV FISTULA PLACEMENT: SHX1204

## 2021-12-09 LAB — POCT I-STAT, CHEM 8
BUN: 45 mg/dL — ABNORMAL HIGH (ref 6–20)
Calcium, Ion: 1.12 mmol/L — ABNORMAL LOW (ref 1.15–1.40)
Chloride: 108 mmol/L (ref 98–111)
Creatinine, Ser: 3 mg/dL — ABNORMAL HIGH (ref 0.44–1.00)
Glucose, Bld: 123 mg/dL — ABNORMAL HIGH (ref 70–99)
HCT: 33 % — ABNORMAL LOW (ref 36.0–46.0)
Hemoglobin: 11.2 g/dL — ABNORMAL LOW (ref 12.0–15.0)
Potassium: 4.1 mmol/L (ref 3.5–5.1)
Sodium: 141 mmol/L (ref 135–145)
TCO2: 23 mmol/L (ref 22–32)

## 2021-12-09 LAB — GLUCOSE, CAPILLARY
Glucose-Capillary: 125 mg/dL — ABNORMAL HIGH (ref 70–99)
Glucose-Capillary: 135 mg/dL — ABNORMAL HIGH (ref 70–99)

## 2021-12-09 LAB — NO BLOOD PRODUCTS

## 2021-12-09 SURGERY — ARTERIOVENOUS (AV) FISTULA CREATION
Anesthesia: Monitor Anesthesia Care | Site: Arm Upper | Laterality: Left

## 2021-12-09 MED ORDER — INSULIN ASPART 100 UNIT/ML IJ SOLN: 0.0000 [IU] | INTRAMUSCULAR | Status: DC | PRN

## 2021-12-09 MED ORDER — CHLORHEXIDINE GLUCONATE 0.12 % MT SOLN
15.0000 mL | Freq: Once | OROMUCOSAL | Status: AC
  Administered 2021-12-09: 15 mL via OROMUCOSAL
  Filled 2021-12-09: qty 15

## 2021-12-09 MED ORDER — CHLORHEXIDINE GLUCONATE 4 % EX LIQD: 60.0000 mL | Freq: Once | CUTANEOUS | Status: DC

## 2021-12-09 MED ORDER — PROPOFOL 500 MG/50ML IV EMUL
INTRAVENOUS | Status: DC | PRN
  Administered 2021-12-09: 50 ug/kg/min via INTRAVENOUS

## 2021-12-09 MED ORDER — HEPARIN 6000 UNIT IRRIGATION SOLUTION
Status: AC
  Filled 2021-12-09: qty 500

## 2021-12-09 MED ORDER — FENTANYL CITRATE (PF) 100 MCG/2ML IJ SOLN: 25.0000 ug | INTRAMUSCULAR | Status: DC | PRN

## 2021-12-09 MED ORDER — MIDAZOLAM HCL 2 MG/2ML IJ SOLN
INTRAMUSCULAR | Status: AC
  Administered 2021-12-09: 1 mg via INTRAVENOUS
  Filled 2021-12-09: qty 2

## 2021-12-09 MED ORDER — FENTANYL CITRATE (PF) 100 MCG/2ML IJ SOLN
INTRAMUSCULAR | Status: AC
  Administered 2021-12-09: 50 ug via INTRAVENOUS
  Filled 2021-12-09: qty 2

## 2021-12-09 MED ORDER — ONDANSETRON HCL 4 MG/2ML IJ SOLN
INTRAMUSCULAR | Status: DC | PRN
  Administered 2021-12-09: 4 mg via INTRAVENOUS

## 2021-12-09 MED ORDER — HEPARIN 6000 UNIT IRRIGATION SOLUTION
Status: DC | PRN
  Administered 2021-12-09: 1

## 2021-12-09 MED ORDER — SODIUM CHLORIDE 0.9 % IV SOLN
INTRAVENOUS | Status: DC
  Administered 2021-12-09: 10 mL via INTRAVENOUS

## 2021-12-09 MED ORDER — 0.9 % SODIUM CHLORIDE (POUR BTL) OPTIME
TOPICAL | Status: DC | PRN
  Administered 2021-12-09: 1000 mL

## 2021-12-09 MED ORDER — OXYCODONE-ACETAMINOPHEN 5-325 MG PO TABS: 1.0000 | ORAL_TABLET | Freq: Four times a day (QID) | ORAL | 0 refills | Status: DC | PRN

## 2021-12-09 MED ORDER — FENTANYL CITRATE (PF) 100 MCG/2ML IJ SOLN: 50.0000 ug | Freq: Once | INTRAMUSCULAR | Status: AC

## 2021-12-09 MED ORDER — CEFAZOLIN SODIUM-DEXTROSE 2-4 GM/100ML-% IV SOLN
2.0000 g | INTRAVENOUS | Status: AC
  Administered 2021-12-09: 2 g via INTRAVENOUS
  Filled 2021-12-09: qty 100

## 2021-12-09 MED ORDER — ONDANSETRON HCL 4 MG/2ML IJ SOLN
4.0000 mg | Freq: Once | INTRAMUSCULAR | Status: DC | PRN
Start: 2021-12-09 — End: 2021-12-09

## 2021-12-09 MED ORDER — MIDAZOLAM HCL 2 MG/2ML IJ SOLN: 1.0000 mg | Freq: Once | INTRAMUSCULAR | Status: AC

## 2021-12-09 MED ORDER — STERILE WATER FOR IRRIGATION IR SOLN
Status: DC | PRN
  Administered 2021-12-09: 1000 mL

## 2021-12-09 MED ORDER — ORAL CARE MOUTH RINSE: 15.0000 mL | Freq: Once | OROMUCOSAL | Status: AC

## 2021-12-09 MED ORDER — ROPIVACAINE HCL 5 MG/ML IJ SOLN
INTRAMUSCULAR | Status: DC | PRN
  Administered 2021-12-09: 30 mL via PERINEURAL

## 2021-12-09 MED ORDER — ACETAMINOPHEN 500 MG PO TABS
1000.0000 mg | ORAL_TABLET | Freq: Once | ORAL | Status: AC
  Administered 2021-12-09: 1000 mg via ORAL
  Filled 2021-12-09: qty 2

## 2021-12-09 SURGICAL SUPPLY — 33 items
ARMBAND PINK RESTRICT EXTREMIT (MISCELLANEOUS) ×2 IMPLANT
BAG COUNTER SPONGE SURGICOUNT (BAG) ×2 IMPLANT
CANISTER SUCT 3000ML PPV (MISCELLANEOUS) ×2 IMPLANT
CLIP LIGATING EXTRA MED SLVR (CLIP) ×2 IMPLANT
CLIP LIGATING EXTRA SM BLUE (MISCELLANEOUS) ×2 IMPLANT
COVER PROBE W GEL 5X96 (DRAPES) ×1 IMPLANT
DERMABOND ADVANCED (GAUZE/BANDAGES/DRESSINGS) ×1
DERMABOND ADVANCED .7 DNX12 (GAUZE/BANDAGES/DRESSINGS) ×1 IMPLANT
ELECT REM PT RETURN 9FT ADLT (ELECTROSURGICAL) ×2
ELECTRODE REM PT RTRN 9FT ADLT (ELECTROSURGICAL) ×1 IMPLANT
GLOVE BIO SURGEON STRL SZ 6.5 (GLOVE) ×1 IMPLANT
GLOVE BIO SURGEON STRL SZ7.5 (GLOVE) ×2 IMPLANT
GLOVE BIOGEL PI IND STRL 7.0 (GLOVE) IMPLANT
GLOVE BIOGEL PI INDICATOR 7.0 (GLOVE) ×1
GLOVE SS BIOGEL STRL SZ 6.5 (GLOVE) IMPLANT
GLOVE SS N UNI LF 7.0 STRL (GLOVE) ×1 IMPLANT
GLOVE SUPERSENSE BIOGEL SZ 6.5 (GLOVE) ×2
GOWN STRL REUS W/ TWL LRG LVL3 (GOWN DISPOSABLE) ×2 IMPLANT
GOWN STRL REUS W/ TWL XL LVL3 (GOWN DISPOSABLE) ×1 IMPLANT
GOWN STRL REUS W/TWL LRG LVL3 (GOWN DISPOSABLE) ×2
GOWN STRL REUS W/TWL XL LVL3 (GOWN DISPOSABLE) ×1
KIT BASIN OR (CUSTOM PROCEDURE TRAY) ×2 IMPLANT
KIT TURNOVER KIT B (KITS) ×2 IMPLANT
NS IRRIG 1000ML POUR BTL (IV SOLUTION) ×2 IMPLANT
PACK CV ACCESS (CUSTOM PROCEDURE TRAY) ×2 IMPLANT
PAD ARMBOARD 7.5X6 YLW CONV (MISCELLANEOUS) ×4 IMPLANT
SUT MNCRL AB 4-0 PS2 18 (SUTURE) ×2 IMPLANT
SUT PROLENE 6 0 BV (SUTURE) ×2 IMPLANT
SUT VIC AB 3-0 SH 27 (SUTURE) ×1
SUT VIC AB 3-0 SH 27X BRD (SUTURE) ×1 IMPLANT
TOWEL GREEN STERILE (TOWEL DISPOSABLE) ×2 IMPLANT
UNDERPAD 30X36 HEAVY ABSORB (UNDERPADS AND DIAPERS) ×2 IMPLANT
WATER STERILE IRR 1000ML POUR (IV SOLUTION) ×2 IMPLANT

## 2021-12-09 NOTE — Op Note (Signed)
    Patient name: Maria Hudson MRN: [id number] DOB: 05/19/1961 Sex: female  12/09/2021 Pre-operative Diagnosis: Chronic kidney disease Post-operative diagnosis:  Same Surgeon:  Luanna Salk. Randie Heinz, MD Assistant: Nathanial Rancher, PA Procedure Performed:  Left brachial artery to cephalic vein AV fistula creation  Indications: 61 year old female with chronic kidney disease now indicated for permanent dialysis access.  She appears to have suitable basilic and cephalic veins on the left for fistula creation and she is right-hand dominant we have plan for left arm fistula versus possible graft.  Findings: The vein measured approximately 5 mm at the antecubitum and the artery was similar sized.  At completion there was a very strong thrill in the vein and a palpable radial artery pulse at the wrist.   Procedure:  The patient was identified in the holding area and taken to the operating room where she was placed supine operative table and MAC anesthesia was induced.  She was sterilely prepped and draped in the left upper extremity in the usual fashion, antibiotics were administered and a timeout was called.  A preoperative block of been placed and this was checked and noted to be intact.  Ultrasound was used to identify the cephalic vein which was noted to be suitable for fistula creation as was the brachial artery.  A transverse incision was made.  The vein was dissected from the underlying tissue where there was significant scar tissue from previous intravenous access most likely.  Branches were divided between clips and ties.  After the vein was dissected free the deep fascia was incised and the neurovascular bundle was identified.  1 brachial vein was divided between ties to allow for access to the brachial artery and a vessel loop was placed around this.  After that the cephalic vein was clamped distally and transected flush with heparinized saline and clamped more centrally and then tied off distally.  He was  spatulated again flushed with heparinized saline.  The artery was clamped distally and proximally opened longitudinally and flushed with heparinized saline both directions.  The vein was then sewn into side with 6-0 Prolene suture.  Prior completion without flushing all directions.  Upon completion there was very strong thrill in the fistula and a palpable radial artery pulse at the wrist.  Satisfied with this we irrigated the wound obtain hemostasis and closed in layers with Vicryl and Monocryl.  Dermabond was placed at the skin level.  She was awakened from anesthesia having tolerated procedure well without immediate complication.  All counts were correct at completion.  Given the complexity of the case,  the assistant was necessary in order to expedient the procedure and safely perform the technical aspects of the operation.  The assistant provided traction and countertraction to assist with exposure of the artery and vein.  They also assisted with suture ligation of multiple venous branches.  They played a critical role in the anastomosis. These skills, especially following the Prolene suture for the anastomosis, could not have been adequately performed by a scrub tech assistant.    EBL: 25 cc.     Hadden Steig C. Randie Heinz, MD [facility] Office: [phone] Pager: [phone]

## 2021-12-10 ENCOUNTER — Encounter (HOSPITAL_COMMUNITY): Payer: Self-pay | Admitting: Vascular Surgery

## 2021-12-11 ENCOUNTER — Other Ambulatory Visit: Payer: Self-pay | Admitting: Obstetrics and Gynecology

## 2021-12-11 NOTE — Patient Outreach (Signed)
Medicaid Managed Care   Nurse Care Manager Note  12/11/2021 Name:  Romesha Scherer MRN:  [id number] DOB:  [birth year]  Maria Hudson is an 61 y.o. year old female who is a primary patient of Nolene Ebbs, MD.  The Medicaid Managed Care Coordination team was consulted for assistance with:    Chronic healthcare management needs, CAD, CHF, OSA, chronic pain, DM, HTN, HLD, CKD, depression, neuropathy, vision loss , retinopathy  Ms. Ragon was given information about Medicaid Managed Care Coordination team services today. [name] Patient agreed to services and verbal consent obtained.  [name] for follow up visit in response to provider referral for case management and/or care coordination services.   Assessments/Interventions:  Review of past medical history, allergies, medications, health status, including review of consultants reports, laboratory and other test data, was performed as part of comprehensive evaluation and provision of chronic care management services.  SDOH (Social Determinants of Health) assessments and interventions performed: SDOH Interventions    Flowsheet Row Most Recent Value  SDOH Interventions   Financial Strain Interventions Intervention Not Indicated  Housing Interventions Intervention Not Indicated       Care Plan  Allergies  Allergen Reactions   Metformin And Related Nausea Only   Hydrocodone Itching    Patient able to tolerate when taken with Benadryl.   Oxycodone-Acetaminophen Itching    Patient able to tolerate when taken with Benadryl.    Medications Reviewed Today     Reviewed by Gayla Medicus, RN (Registered Nurse) on 12/11/21 at [facility] List Status: <None>   Medication Order Taking? Sig Documenting Provider Last Dose Status Informant  Accu-Chek Softclix Lancets lancets [id number] No 3 (three) times daily. [provider] Taking Active Self  acetaminophen (TYLENOL) 500 MG tablet [id number] No Take 1,000  mg by mouth every 6 (six) hours as needed for moderate pain or headache. [provider] More than a month Active Self  ACIDOPHILUS LACTOBACILLUS PO [id number] No Take 1 Capful by mouth daily. [provider] More than a month Active Self  allopurinol (ZYLOPRIM) 100 MG tablet [id number] No Take 100 mg by mouth in the morning. [provider] 12/09/2021 0630 Active Self  aspirin EC 81 MG tablet [id number] No Take 81 mg by mouth in the morning. Swallow whole. [provider] 12/09/2021 0630 Active Self           Med Note [name], [name]   Mon Apr 14, 2021  9:22 PM)    atorvastatin (LIPITOR) 40 MG tablet [id number] No Take 1 tablet (40 mg total) by mouth every evening. Skeet Latch, MD 12/08/2021 Active Self  buPROPion (WELLBUTRIN) 100 MG tablet [id number] No Take 100 mg by mouth 2 (two) times daily. [provider] 12/09/2021 0630 Active Self  carvedilol (COREG) 25 MG tablet [id number] No Take 1 tablet (25 mg total) by mouth 2 (two) times daily with a meal. [name], [name], MD 12/09/2021 0630 Active Self  cholecalciferol (VITAMIN D3) 25 MCG (1000 UNIT) tablet [id number] No Take 2,000 Units by mouth daily. [provider] 12/08/2021 Active Self  cloNIDine (CATAPRES) 0.3 MG tablet [id number] No Take 0.3 mg by mouth 3 (three) times daily. [provider] 12/09/2021 0630 Active Self  diclofenac Sodium (VOLTAREN) 1 % GEL [id number] No Apply 2 g topically 4 (four) times daily as needed (chest pain.). [provider] More than a month Active Self  ferrous sulfate 325 (65 FE) MG tablet [id number] No Take 325 mg by mouth 2 (  two) times daily. [provider] 12/08/2021 Active Self  fluticasone (FLONASE) 50 MCG/ACT nasal spray [id number] No Place 1 spray into both nostrils daily as needed for allergies or rhinitis. [provider] More than a month Active Self  hydrALAZINE (APRESOLINE) 100 MG tablet [id number] No Take 100 mg by  mouth 3 (three) times daily. [provider] 12/09/2021 0630 Active Self  insulin glargine (LANTUS) 100 unit/mL SOPN [id number] No Inject 10 Units into the skin at bedtime. [provider] 12/08/2021 Active Self           Med Note [facility] [place]   Tue Aug 19, 2021  9:37 AM)    isosorbide mononitrate (IMDUR) 60 MG 24 hr tablet [id number] No Take 1.5 tablets (90 mg total) by mouth daily.  Patient taking differently: Take 90 mg by mouth every evening.   Allena Katz Damar, [place] 12/08/2021 Active Self  loratadine (CLARITIN) 10 MG tablet [id number] No Take 10 mg by mouth in the morning. [provider] 12/09/2021 0630 Active Self  Multiple Vitamin (MULTIVITAMIN ADULT) TABS [id number] No Take 1 tablet by mouth in the morning. [provider] 12/08/2021 Active Self  NIFEdipine (ADALAT CC) 60 MG 24 hr tablet [id number] No Take 2 tablets (120 mg total) by mouth daily. Larey Dresser, MD 12/09/2021 Active Self  nitroGLYCERIN (NITROSTAT) 0.4 MG SL tablet [id number] No Place 1 tablet (0.4 mg total) under the tongue every 5 (five) minutes as needed for chest pain. [name], [name], MD More than a month Active Self           Med Note [facility] [place]   Tue Aug 19, 2021  9:35 AM)    NOVOLOG FLEXPEN 100 UNIT/ML FlexPen [id number] No Inject 4 Units into the skin 3 (three) times daily with meals. [provider] 12/09/2021 0400 Active Self           Med Note Sander Nephew, Galen Daft E   Tue Dec 09, 2021  8:58 AM) Patient took 2 units at 0400   Omega-3 Fatty Acids (FISH OIL) 1200 MG CAPS [id number] No Take 1,200 mg by mouth in the morning. [provider] 12/08/2021 Active Self  oxyCODONE-acetaminophen (PERCOCET) 5-325 MG tablet [id number]  Take 1 tablet by mouth every 6 (six) hours as needed for severe pain. Baglia, Corrina, PA-C  Active   potassium chloride SA (KLOR-CON M) 20 MEQ tablet [id number] No Take 1 tablet (20 mEq total) by mouth daily.  Patient not taking:  Reported on 12/03/2021   Rafael Bihari, FNP Not Taking Active Self  predniSONE (DELTASONE) 20 MG tablet [id number] No Take 20 mg by mouth daily as needed (gout). [provider] More than a month Active Self  pregabalin (LYRICA) 25 MG capsule [id number] No Take 25 mg by mouth 2 (two) times daily. [provider] 12/08/2021 Active Self  Semaglutide,0.25 or 0.5MG/DOS, (OZEMPIC, 0.25 OR 0.5 MG/DOSE,) 2 MG/3ML SOPN [id number] No Inject 0.37m under the skin once weekly, please contact the office when you have completed the three remaining doses of 0.5 to move to the next dose.  Patient taking differently: Inject 0.5 mg into the skin every Wednesday. Inject 0.560munder the skin once weekly, please contact the office when you have completed the three remaining doses of 0.5 to move to the next dose.   RaSkeet LatchMD Past Week Active Self  sertraline (ZOLOFT) 50 MG tablet [id number] Take 50 mg by mouth in the morning. [provider] 12/09/2021 0630 Active Self  torsemide (DEMADEX) 20 MG tablet [id number] No Take 2 tablets (40 mg total) by mouth daily for 3 days, THEN 1 tablet (20 mg total) daily.  Patient taking differently: 1 tablet by mouth (20 mg total) daily.   Frierson, Sequoyah 12/08/2021 Active Self  traZODone (DESYREL) 50 MG tablet [id number] No Take 50 mg by mouth at bedtime. [provider] 12/08/2021 Active Self            Patient Active Problem List   Diagnosis Date Noted   Pain due to onychomycosis of toenails of both feet 12/05/2021   Pure hypercholesterolemia 05/27/2021   Chronic diastolic CHF (congestive heart failure) ([place]) 04/29/2021   Hypertensive urgency 04/14/2021   Acute on chronic diastolic CHF (congestive heart failure) ([place]) 04/14/2021   Chest pain 03/05/2021   CAD (coronary artery disease) 03/05/2021   T2DM (type 2 diabetes mellitus) ([place]) 03/05/2021   CKD (chronic kidney disease) stage 4, GFR 15-29 ml/min ([place]) 03/05/2021    Class 3 obesity ([place]) 03/05/2021   CVA (cerebral vascular accident) ([place]) 03/05/2021   Acute on chronic diastolic heart failure ([place]) 03/05/2021   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   AKI (acute kidney injury) ([place]) 03/05/2021   Vitreous hemorrhage of left eye ([place]) 11/14/2020   Orthopnea 08/06/2020   Vision loss of right eye 08/06/2020   Excessive daytime sleepiness 01/11/2020   Iron deficiency anemia 12/14/2019   Vitreous hemorrhage, right ([place]) 12/04/2019   Diabetic neuropathy ([place]) 07/26/2019   Proliferative diabetic retinopathy of both 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 ([place]) 02/03/2019   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   Conductive hearing loss, bilateral 03/03/2018   Chronic bilateral low back pain without sciatica 08/21/2015   Chronic kidney disease (CKD) stage G3b/A3, moderately decreased glomerular filtration rate (GFR) between 30-44 mL/min/1.73 square meter and albuminuria creatinine ratio greater than 300 mg/g ([place]) 04/30/2015   Type 2 diabetes mellitus with diabetic polyneuropathy, with long-term current use of insulin ([place]) 02/15/2015   Advance directive on file 05/17/2014   Refusal of blood transfusions as patient is Jehovah's Witness 05/17/2014   OSA treated with BiPAP 02/23/2014   Coronary artery disease 07/07/2013   Severe obesity (BMI >= 40) ([place]) 05/18/2013   Neuropathy 78/46/9629   Diastolic heart failure ([place]) 10/16/2012   Essential hypertension 10/16/2012   Conditions to be addressed/monitored per PCP order:  Chronic healthcare management needs, CAD, CHF, OSA, chronic pain, DM, HTN, HLD, CKD, depression, neuropathy, vision loss  Care Plan : RN Care Manager Plan Of Care  Updates made by Gayla Medicus, RN since 12/11/2021 12:00 AM     Problem: Knowledge Deficit and Care Coordination Needs Related to Management of HTN, HF, Type 2 DM   Priority: High     Long-Range Goal: Development of Plan of Care to Address Knowledge Deficits  and Care Coordination Needs related to  management of CHF, CAD, HTN, HLD, DMII, CKD Stage 4, and Depression   Start Date: 05/06/2021  Expected End Date: 05/06/2022  Priority: High  Note:   Current Barriers:  Knowledge Deficits related to plan of care for management of CHF, CAD, HTN, HLD, DMII, CKD Stage 4, and Depression 12/11/21:  Patient without complaint today-BP and CBG WNL-now using Dexcom.  PT completed and feels back pain is better.  Patient to call and schedule follow up with Arby Barrette Cozart at [facility].  RNCM Clinical Goal(s):  Patient will demonstrate ongoing adherence to prescribed treatment plan for CHF, CAD, HTN, HLD, DMII, CKD Stage  4, and Depression as evidenced by patient report of improved health and quality of life and no unplanned ED or unplanned hospital admissions continue to work with RN Care Manager and/or Social Worker to address care management and care coordination needs related to CHF, CAD, HTN, HLD, DMII, CKD Stage 4, and Depression as evidenced by adherence to CM Team Scheduled appointments      Work with [facility] LCSW and psychiatrist to treat depression  Interventions: Inter-disciplinary care team collaboration (see longitudinal plan of care) Evaluation of current treatment plan related to  self management and patient's adherence to plan as established by provider Mailed summary of benefits for her [place] Medicaid plan to her home address   Heart Failure Interventions:  (Status: Goal on Track (progressing): YES.)  Long Term Goal - patient states her fluid retention has significantly improved,  says she cannot tolerate the compression stockings because they are too tight and make her legs hurt Assessed patient's heart failure management strategies  Diabetes:  (Status: Goal on Track (progressing): YES.) Long Term Goal - patient says her CBGS have been quite variable and she is not sure why, denies hypoglycemia or sustained hyperglycemia  Lab Results  Component Value  Date   HGBA1C 6.7 (H) 04/29/2021    Assessed patient's understanding of A1C goal: <7% Reviewed medications with patient and discussed importance of medication adherence;        Counseled on importance of regular laboratory monitoring as prescribed;        Discussed plans with patient for ongoing care management follow up and provided patient with direct contact information for care management team;      Advised patient, providing education and rationale, to check cbg three times daily and record        call provider for findings outside established parameters;       Review of patient status, including review of consultants reports, relevant laboratory and other test results, and medications completed;       Discussed addition of Ozempic to medication regimen , reviewed mechanism of action and proven cardiovascular benefits  Hypertension and CKD stage 4 : (Status: Goal on Track (progressing): YES.) Long Term Goal-  has been placed on kidney transplant waiting list, fistu;a placed 12/09/21. Last practice recorded BP readings:   BP Readings from Last 3 Encounters:  09/30/21 120/70  09/12/21 (!) 107/58  09/08/21 (!) 144/71     Most recent eGFR/CrCl:    No components found for: CRCL Lab Results  Component Value Date   NA 136 08/19/2021   K 3.9 08/19/2021   CREATININE 2.51 (H) 08/19/2021   EGFR 19 (L) 03/13/2021   GFRNONAA 22 (L) 08/19/2021   GLUCOSE 122 (H) 08/19/2021    Evaluation of current treatment plan related to hypertension self management and patient's adherence to plan as established by provider;   Reviewed medications with patient and discussed importance of compliance;  Discussed plans with patient for ongoing care management follow up and provided patient with direct contact information for care management team; Advised patient, providing education and rationale, to monitor blood pressure daily and record, calling PCP for findings outside established parameters;  Reviewed  scheduled/upcoming provider appointments   Weight Loss Interventions:  (Status:  New goal.) Long Term Goal--patient has started Ozempic Discussed indications, mechanism of action, common side effects and CV benefits  of GLP1 Ozempic Assessed tolerance of Ozempic  Interdisciplinary Collaboration:  (Status: Goal on Track (progressing): YES.) Long Term Goal  Collaborated with BSW to initiate  plan of care to address needs related to Housing barriers, Inability to perform ADL's independently, and Inability to perform IADL's independently in patient with CHF, CAD, HTN, HLD, DM, CKD Stage 4, and Depression Assessed status of patient's involvement with behavioral health counselor Collaboration with pharmacist for medication review, discussion of ? Medication side effects causing chronic constipation and arranging for patient to received home delivery of medications in blister packs Collaboration with Nolene Ebbs, MD regarding development and update of comprehensive plan of care as evidenced by provider attestation and co-signature Inter-disciplinary care team collaboration (see longitudinal plan of care)  Patient Goals/Self-Care Activities: Take medications as prescribed   Attend all scheduled provider appointments Call pharmacy for medication refills 3-7 days in advance of running out of medications Attend church or other social activities Perform all self care activities independently  Call provider office for new concerns or questions  Work with the social worker to address care coordination needs and will continue to work with the clinical team to address health care and disease management related needs call office if I gain more than 2 pounds in one day or 5 pounds in one week keep legs up while sitting track weight in diary use salt in moderation watch for swelling in feet, ankles and legs every day weigh myself daily know when to call the doctorfor fluid weight gain or consistently  abnormal BP readings at home track symptoms and what helps feel better or worse check blood sugar at prescribed times: three times daily enter blood sugar readings and medication or insulin into daily log take the blood sugar log to all doctor visits take the blood sugar meter to all doctor visits eat fish at least once per week fill half of plate with vegetables manage portion size check blood pressure daily write blood pressure results in a log or diary learn about high blood pressure take blood pressure log to all doctor appointments call doctor for signs and symptoms of high blood pressure keep all doctor appointments take medications for blood pressure exactly as prescribed Limit fluid intake to 1 liter per day Collaborate with managed Medicaid team of BSW and pharmacist to address identified needs Work with cardiology pharmacist to assist with BP stabilization Work with [facility] LCSW and psychiatrist to treat depression Review summary of [employer] benefits mailed to home address and call to enroll in program that will benefit your chronic health issues Continue to use [place] [facility] calendar to record weight , BP and CBGs so readings are all in one place Schedule dental appt.   Long-Range Goal: Establish Plan of Care for Chronic Disease Management Needs   Start Date: 11/12/2021  Expected End Date: 02/12/2022  Priority: High  Note:   Timeframe:  Long-Range Goal Priority:  High Start Date:     11/12/21                        Expected End Date:  ongoing                     Follow Up Date: 01/19/22   - schedule appointment for vaccines needed due to my age or health - schedule recommended health tests (blood work, mammogram, colonoscopy, pap test) - schedule and keep appointment for annual check-up    Why is this important?   Screening tests can find diseases early when they are easier to treat.  Your doctor or nurse will talk with you about which  tests  are important for you.  Getting shots for common diseases like the flu and shingles will help prevent them.  12/11/21:  patient seen and evaluated by [facility] 11/19/21, fistula placed 6/27   Follow Up:  Patient agrees to Care Plan and Follow-up.  Plan: The Managed Medicaid care management team will reach out to the patient again over the next 30 business days. and The  Patient has been provided with contact information for the Managed Medicaid care management team and has been advised to call with any health related questions or concerns.  Date/time of next scheduled RN care management/care coordination outreach:  01/19/22 at 0900

## 2021-12-11 NOTE — Patient Instructions (Signed)
Hi Maria Hudson, great to speak with you this morning-have a wonderful day!!  Maria Hudson was given information about Medicaid Managed Care team care coordination services as a part of their [facility] Medicaid benefit. Maria Hudson verbally consented to engagement with the [facility] Managed Care team.   If you are experiencing a medical emergency, please call 911 or report to your local emergency department or urgent care.   If you have a non-emergency medical problem during routine business hours, please contact your provider's office and ask to speak with a nurse.   For questions related to your [facility] health plan, please call: [phone] or go here:[email or web address]  If you would like to schedule transportation through your [facility] plan, please call the following number at least 2 days in advance of your appointment: [phone].  You can also use the MTM portal or MTM mobile app to manage your rides. For the portal, please go to [email or web address].  Call the [place] at [phone], at any time, 24 hours a day, 7 days a week. If you are in danger or need immediate medical attention call 911.  If you would like help to quit smoking, call 1-800-QUIT-NOW 8163176577) OR Espaol: 1-855-Djelo-Ya (1-856-314-9702) o para ms informacin haga clic aqu or Text READY to 200-400 to register via text  Maria Hudson - following are the goals we discussed in your visit today:   Goals Addressed    Long-Range Goal: Establish Plan of Care for Chronic Disease Management Needs   Start Date: 11/12/2021  Expected End Date: 02/12/2022  Priority: High  Note:   Timeframe:  Long-Range Goal Priority:  High Start Date:     11/12/21                        Expected End Date:  ongoing                     Follow Up Date: 01/19/22   - schedule appointment for vaccines needed due to my age or health - schedule recommended health tests (blood work, mammogram,  colonoscopy, pap test) - schedule and keep appointment for annual check-up    Why is this important?   Screening tests can find diseases early when they are easier to treat.  Your doctor or nurse will talk with you about which tests are important for you.  Getting shots for common diseases like the flu and shingles will help prevent them.  12/11/21:  patient seen and evaluated by [facility] 11/19/21, fistula placed 6/27  Patient verbalizes understanding of instructions and care plan provided today and agrees to view in [place]. Active MyChart status and patient understanding of how to access instructions and care plan via MyChart confirmed with patient.     The Managed Medicaid care management team will reach out to the patient again over the next 30 business days.  The  Patient  has been provided with contact information for the Managed Medicaid care management team and has been advised to call with any health related questions or concerns.   Maria Raider RN, BSN [place] Management Coordinator - Managed Medicaid High Risk [phone]   Following is a copy of your plan of care:  Care Plan : [place]  Updates made by Gayla Medicus, RN since 12/11/2021 12:00 AM     Problem: Knowledge Deficit and Care Coordination Needs Related to Management of HTN,  HF, Type 2 DM   Priority: High     Long-Range Goal: Development of Plan of Care to Address Knowledge Deficits and Care Coordination Needs related to  management of CHF, CAD, HTN, HLD, DMII, CKD Stage 4, and Depression   Start Date: 05/06/2021  Expected End Date: 05/06/2022  Priority: High  Note:   Current Barriers:  Knowledge Deficits related to plan of care for management of CHF, CAD, HTN, HLD, DMII, CKD Stage 4, and Depression 12/11/21:  Patient without complaint today-BP and CBG WNL-now using Dexcom.  PT completed and feels back pain is better.  Patient to call and schedule follow up with  Arby Barrette Cozart at [facility].  RNCM Clinical Goal(s):  Patient will demonstrate ongoing adherence to prescribed treatment plan for CHF, CAD, HTN, HLD, DMII, CKD Stage 4, and Depression as evidenced by patient report of improved health and quality of life and no unplanned ED or unplanned hospital admissions continue to work with RN Care Manager and/or Social Worker to address care management and care coordination needs related to CHF, CAD, HTN, HLD, DMII, CKD Stage 4, and Depression as evidenced by adherence to CM Team Scheduled appointments      Work with [facility] LCSW and psychiatrist to treat depression  Interventions: Inter-disciplinary care team collaboration (see longitudinal plan of care) Evaluation of current treatment plan related to  self management and patient's adherence to plan as established by provider Mailed summary of benefits for her [place] Medicaid plan to her home address   Heart Failure Interventions:  (Status: Goal on Track (progressing): YES.)  Long Term Goal - patient states her fluid retention has significantly improved,  says she cannot tolerate the compression stockings because they are too tight and make her legs hurt Assessed patient's heart failure management strategies  Diabetes:  (Status: Goal on Track (progressing): YES.) Long Term Goal - patient says her CBGS have been quite variable and she is not sure why, denies hypoglycemia or sustained hyperglycemia  Lab Results  Component Value Date   HGBA1C 6.7 (H) 04/29/2021    Assessed patient's understanding of A1C goal: <7% Reviewed medications with patient and discussed importance of medication adherence;        Counseled on importance of regular laboratory monitoring as prescribed;        Discussed plans with patient for ongoing care management follow up and provided patient with direct contact information for care management team;      Advised patient, providing education and rationale, to check cbg three times  daily and record        call provider for findings outside established parameters;       Review of patient status, including review of consultants reports, relevant laboratory and other test results, and medications completed;       Discussed addition of Ozempic to medication regimen , reviewed mechanism of action and proven cardiovascular benefits  Hypertension and CKD stage 4 : (Status: Goal on Track (progressing): YES.) Long Term Goal-  has been placed on kidney transplant waiting list, fistu;a placed 12/09/21. Last practice recorded BP readings:   BP Readings from Last 3 Encounters:  09/30/21 120/70  09/12/21 (!) 107/58  09/08/21 (!) 144/71     Most recent eGFR/CrCl:    No components found for: CRCL Lab Results  Component Value Date   NA 136 08/19/2021   K 3.9 08/19/2021   CREATININE 2.51 (H) 08/19/2021   EGFR 19 (L) 03/13/2021   GFRNONAA 22 (L) 08/19/2021   GLUCOSE  122 (H) 08/19/2021    Evaluation of current treatment plan related to hypertension self management and patient's adherence to plan as established by provider;   Reviewed medications with patient and discussed importance of compliance;  Discussed plans with patient for ongoing care management follow up and provided patient with direct contact information for care management team; Advised patient, providing education and rationale, to monitor blood pressure daily and record, calling PCP for findings outside established parameters;  Reviewed scheduled/upcoming provider appointments   Weight Loss Interventions:  (Status:  New goal.) Long Term Goal--patient has started Ozempic Discussed indications, mechanism of action, common side effects and CV benefits  of GLP1 Ozempic Assessed tolerance of Ozempic  Interdisciplinary Collaboration:  (Status: Goal on Track (progressing): YES.) Long Term Goal  Collaborated with BSW to initiate plan of care to address needs related to Housing barriers, Inability to perform ADL's  independently, and Inability to perform IADL's independently in patient with CHF, CAD, HTN, HLD, DM, CKD Stage 4, and Depression Assessed status of patient's involvement with behavioral health counselor Collaboration with pharmacist for medication review, discussion of ? Medication side effects causing chronic constipation and arranging for patient to received home delivery of medications in blister packs Collaboration with Nolene Ebbs, MD regarding development and update of comprehensive plan of care as evidenced by provider attestation and co-signature Inter-disciplinary care team collaboration (see longitudinal plan of care)  Patient Goals/Self-Care Activities: Take medications as prescribed   Attend all scheduled provider appointments Call pharmacy for medication refills 3-7 days in advance of running out of medications Attend church or other social activities Perform all self care activities independently  Call provider office for new concerns or questions  Work with the social worker to address care coordination needs and will continue to work with the clinical team to address health care and disease management related needs call office if I gain more than 2 pounds in one day or 5 pounds in one week keep legs up while sitting track weight in diary use salt in moderation watch for swelling in feet, ankles and legs every day weigh myself daily know when to call the doctorfor fluid weight gain or consistently abnormal BP readings at home track symptoms and what helps feel better or worse check blood sugar at prescribed times: three times daily enter blood sugar readings and medication or insulin into daily log take the blood sugar log to all doctor visits take the blood sugar meter to all doctor visits eat fish at least once per week fill half of plate with vegetables manage portion size check blood pressure daily write blood pressure results in a log or diary learn about high  blood pressure take blood pressure log to all doctor appointments call doctor for signs and symptoms of high blood pressure keep all doctor appointments take medications for blood pressure exactly as prescribed Limit fluid intake to 1 liter per day Collaborate with managed Medicaid team of BSW and pharmacist to address identified needs Work with cardiology pharmacist to assist with BP stabilization Work with [facility] LCSW and psychiatrist to treat depression Review summary of [employer] benefits mailed to home address and call to enroll in program that will benefit your chronic health issues Continue to use [place] [facility] calendar to record weight , BP and CBGs so readings are all in one place Schedule dental appt.

## 2021-12-12 LAB — POCT I-STAT, CHEM 8
BUN: 80 mg/dL — ABNORMAL HIGH (ref 6–20)
Calcium, Ion: 0.79 mmol/L — CL (ref 1.15–1.40)
Chloride: 108 mmol/L (ref 98–111)
Creatinine, Ser: 3.1 mg/dL — ABNORMAL HIGH (ref 0.44–1.00)
Glucose, Bld: 122 mg/dL — ABNORMAL HIGH (ref 70–99)
HCT: 31 % — ABNORMAL LOW (ref 36.0–46.0)
Hemoglobin: 10.5 g/dL — ABNORMAL LOW (ref 12.0–15.0)
Potassium: 8.2 mmol/L (ref 3.5–5.1)
Sodium: 136 mmol/L (ref 135–145)
TCO2: 25 mmol/L (ref 22–32)

## 2021-12-13 DIAGNOSIS — G4733 Obstructive sleep apnea (adult) (pediatric): Secondary | ICD-10-CM | POA: Diagnosis not present

## 2021-12-13 DIAGNOSIS — Z419 Encounter for procedure for purposes other than remedying health state, unspecified: Secondary | ICD-10-CM | POA: Diagnosis not present

## 2021-12-15 IMAGING — DX DG CHEST 2V
2 series · 2 of 2 positions shown · non-contrast
Comparison: None.

CLINICAL DATA: Shortness of breath. Congestive heart failure.
Dizziness.

EXAM:
CHEST - 2 VIEW

[chest pa]
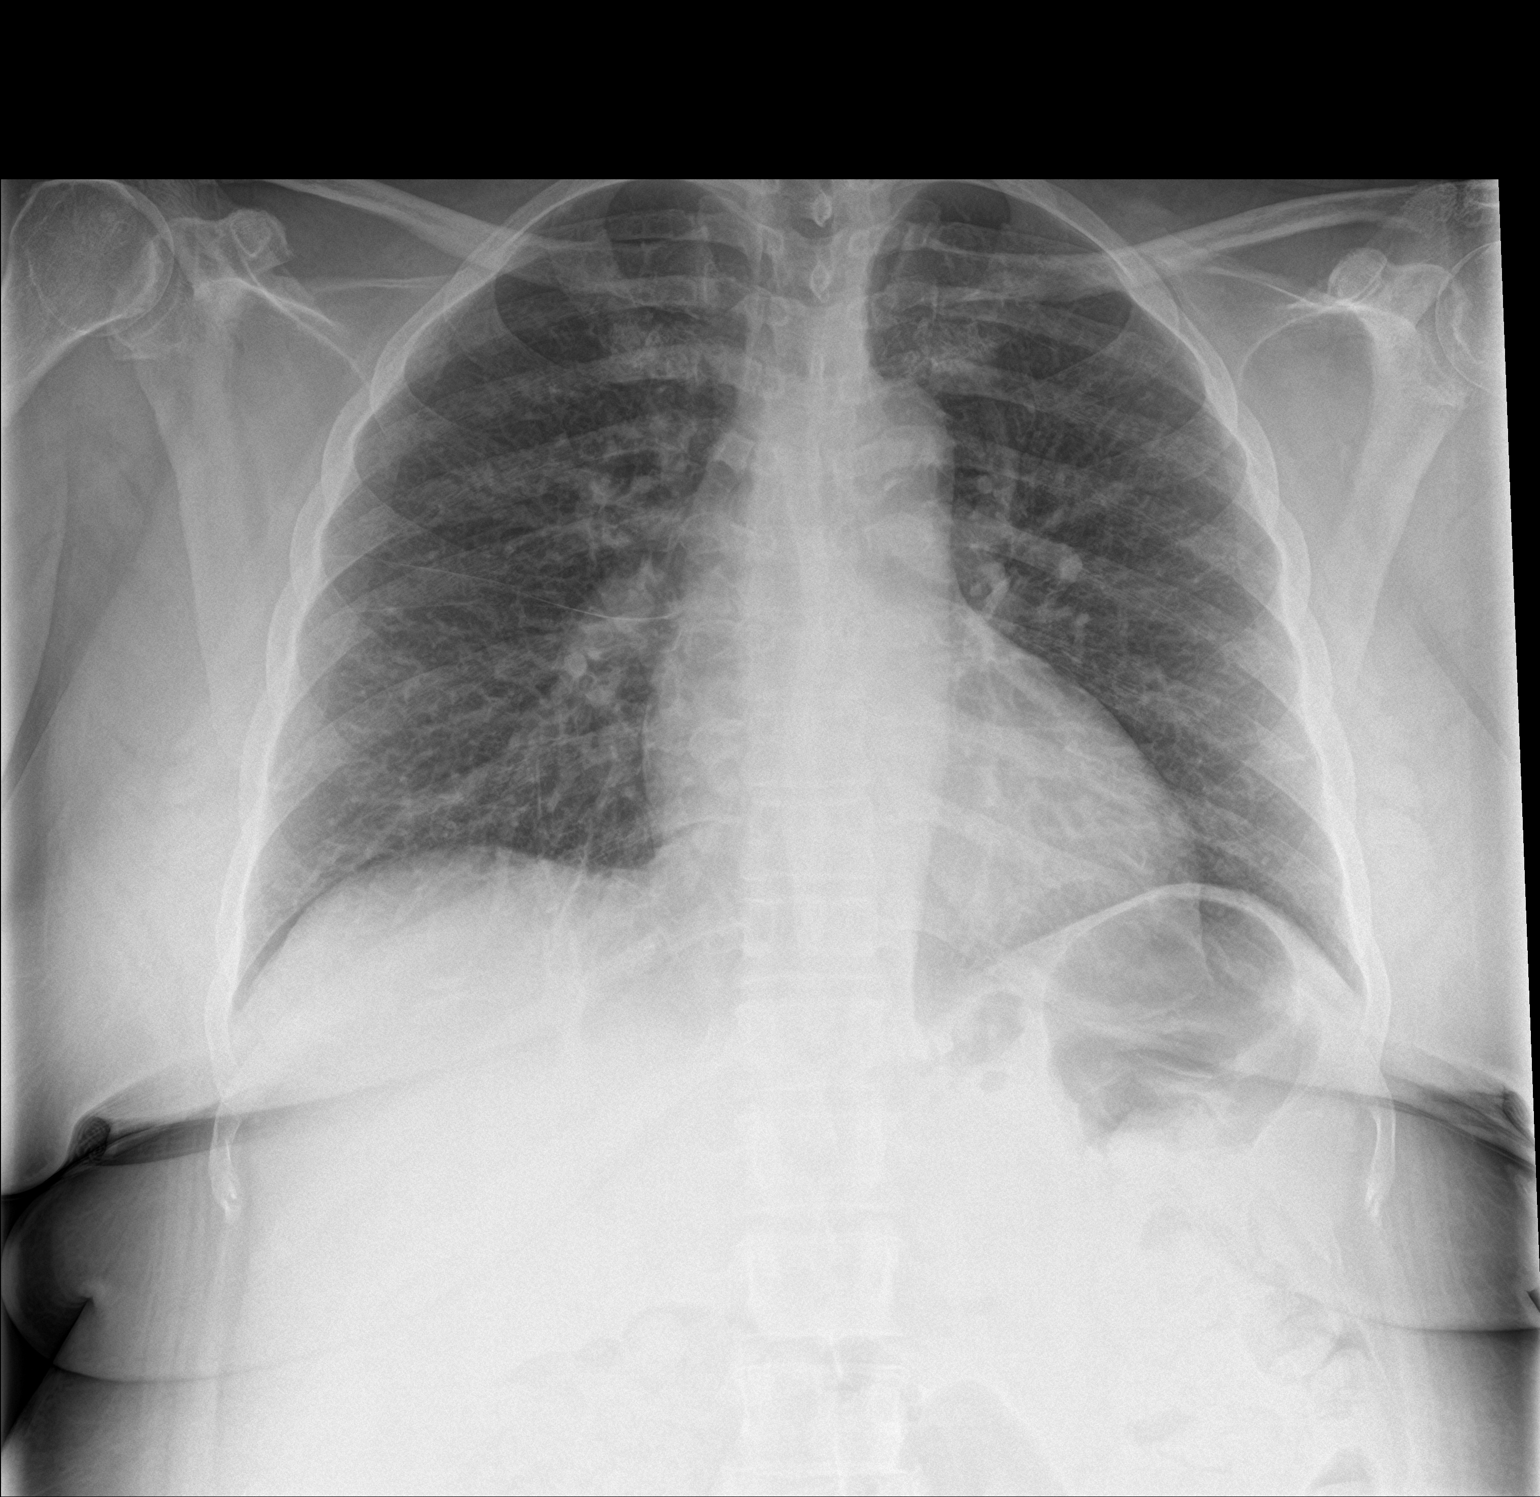

[chest lat]
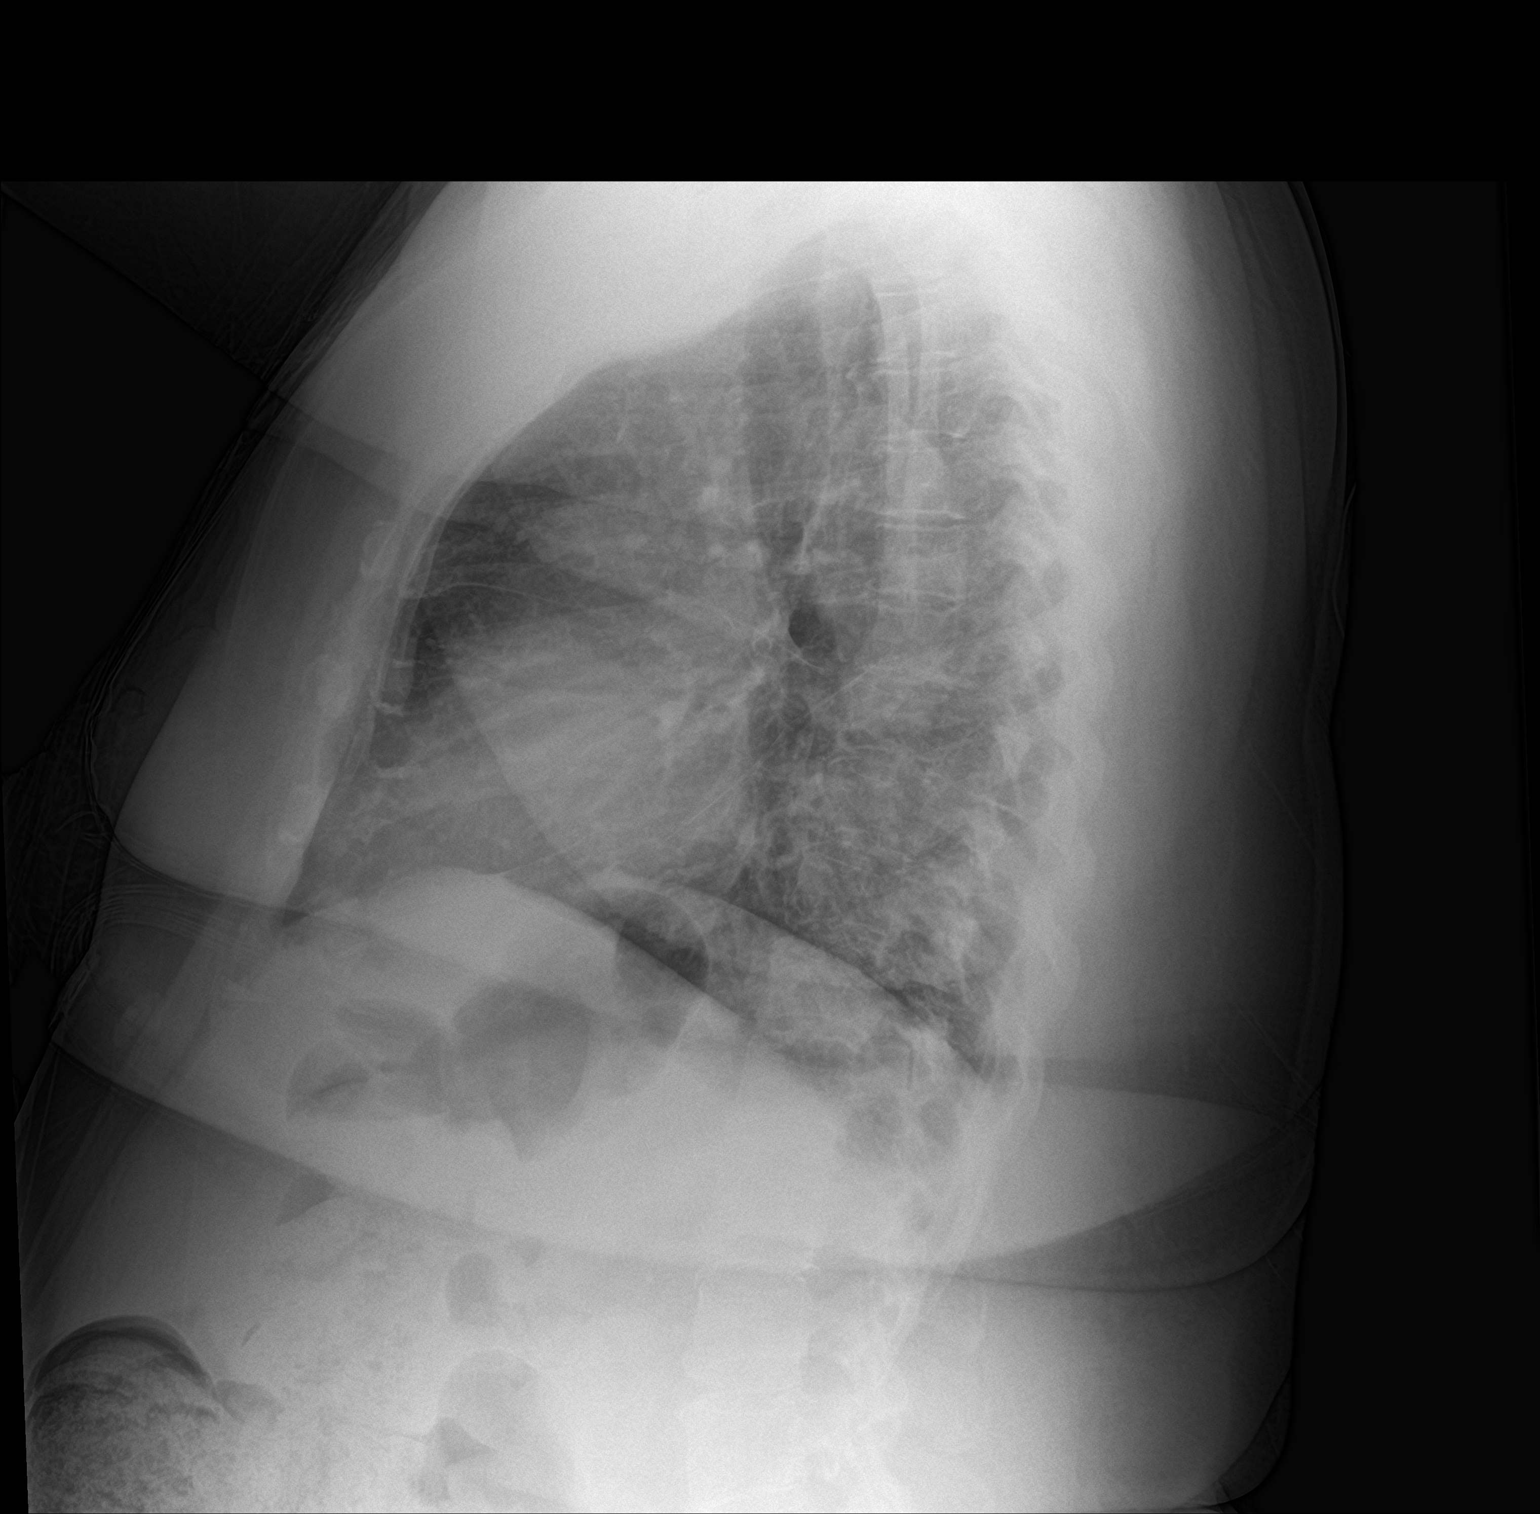

[2 of 2 positions shown; findings below may reference images not displayed]

FINDINGS: Heart size is normal. Mediastinal shadows are normal. Interstitial
lung markings are prominent which could relate to chronic lung
disease or mild interstitial edema. No effusions. No acute bone
finding.
IMPRESSION: Abnormal prominent interstitial markings which could be due to
chronic interstitial lung disease or mild interstitial edema.

## 2021-12-16 IMAGING — DX DG CHEST 1V PORT
2 series · 2 of 2 positions shown · non-contrast
Comparison: Chest x-ray 03/04/2021.

CLINICAL DATA: Dyspnea.

EXAM:
PORTABLE CHEST 1 VIEW

[chest ap (1 of 2)]
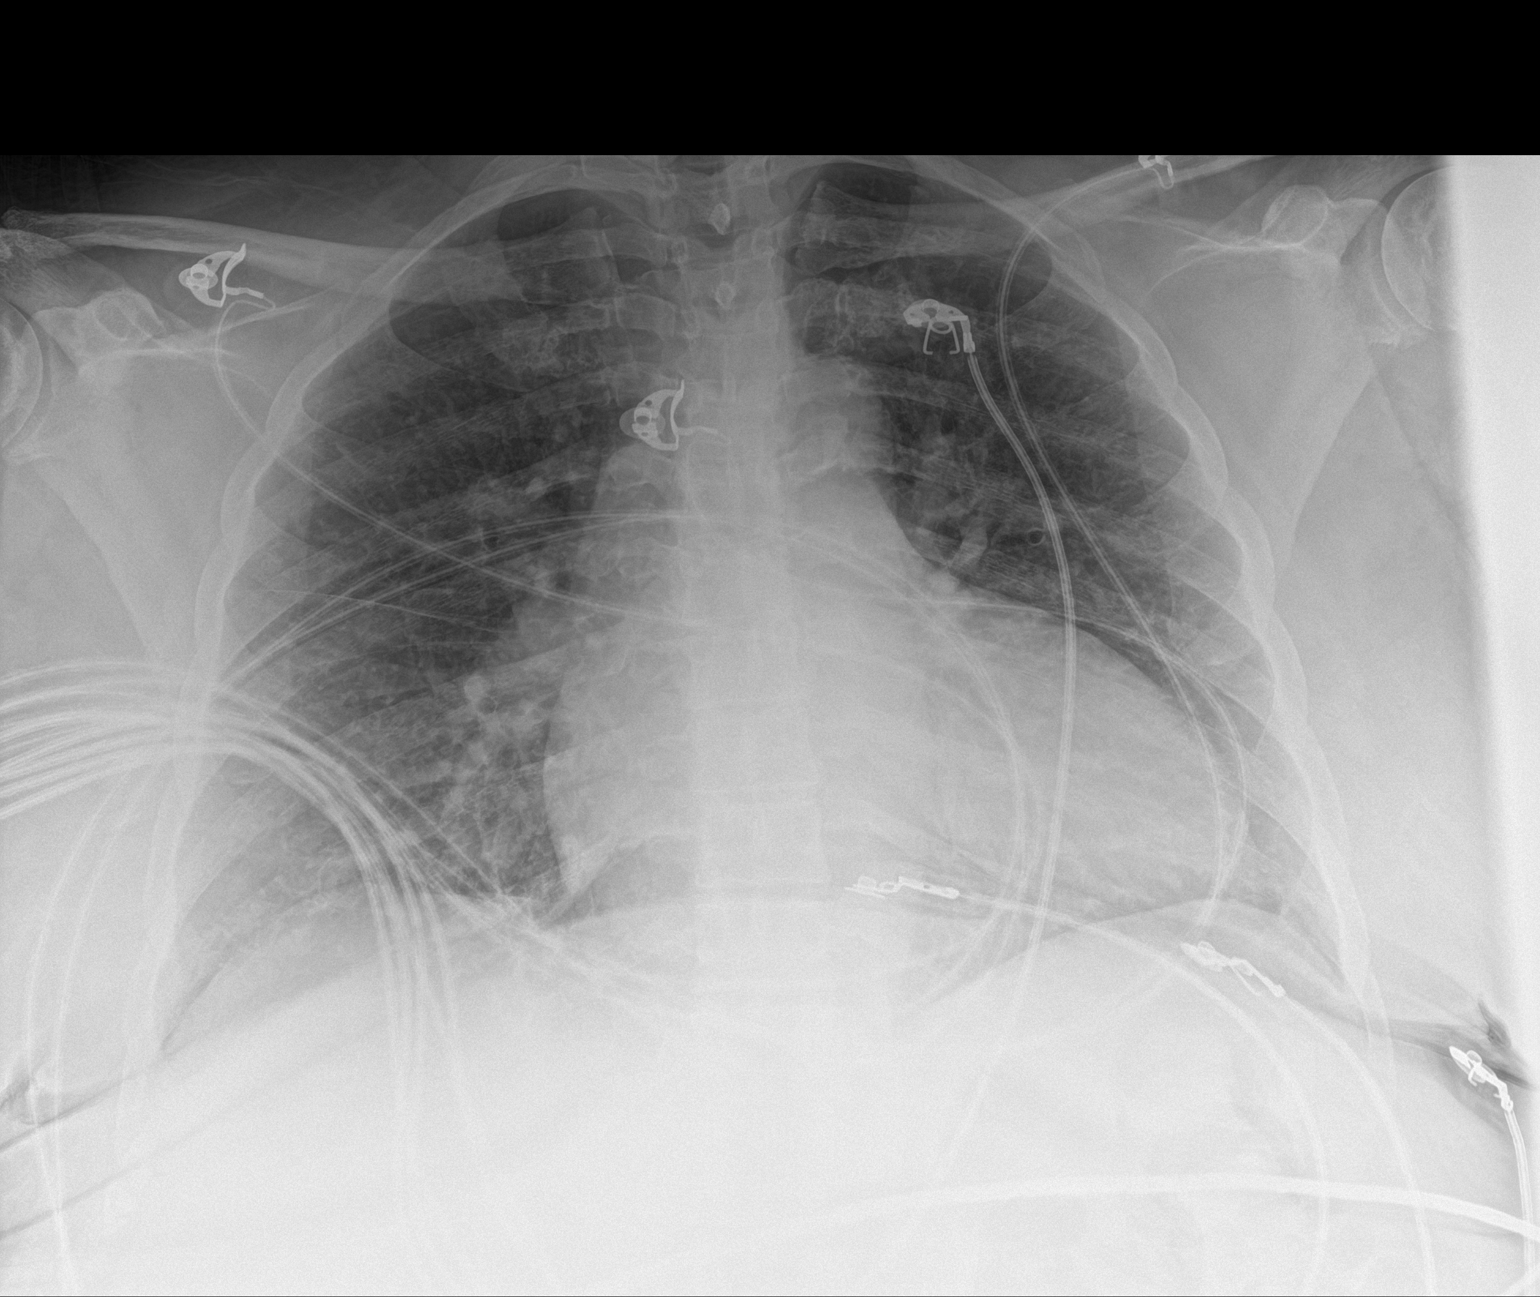

[chest ap (2 of 2)]
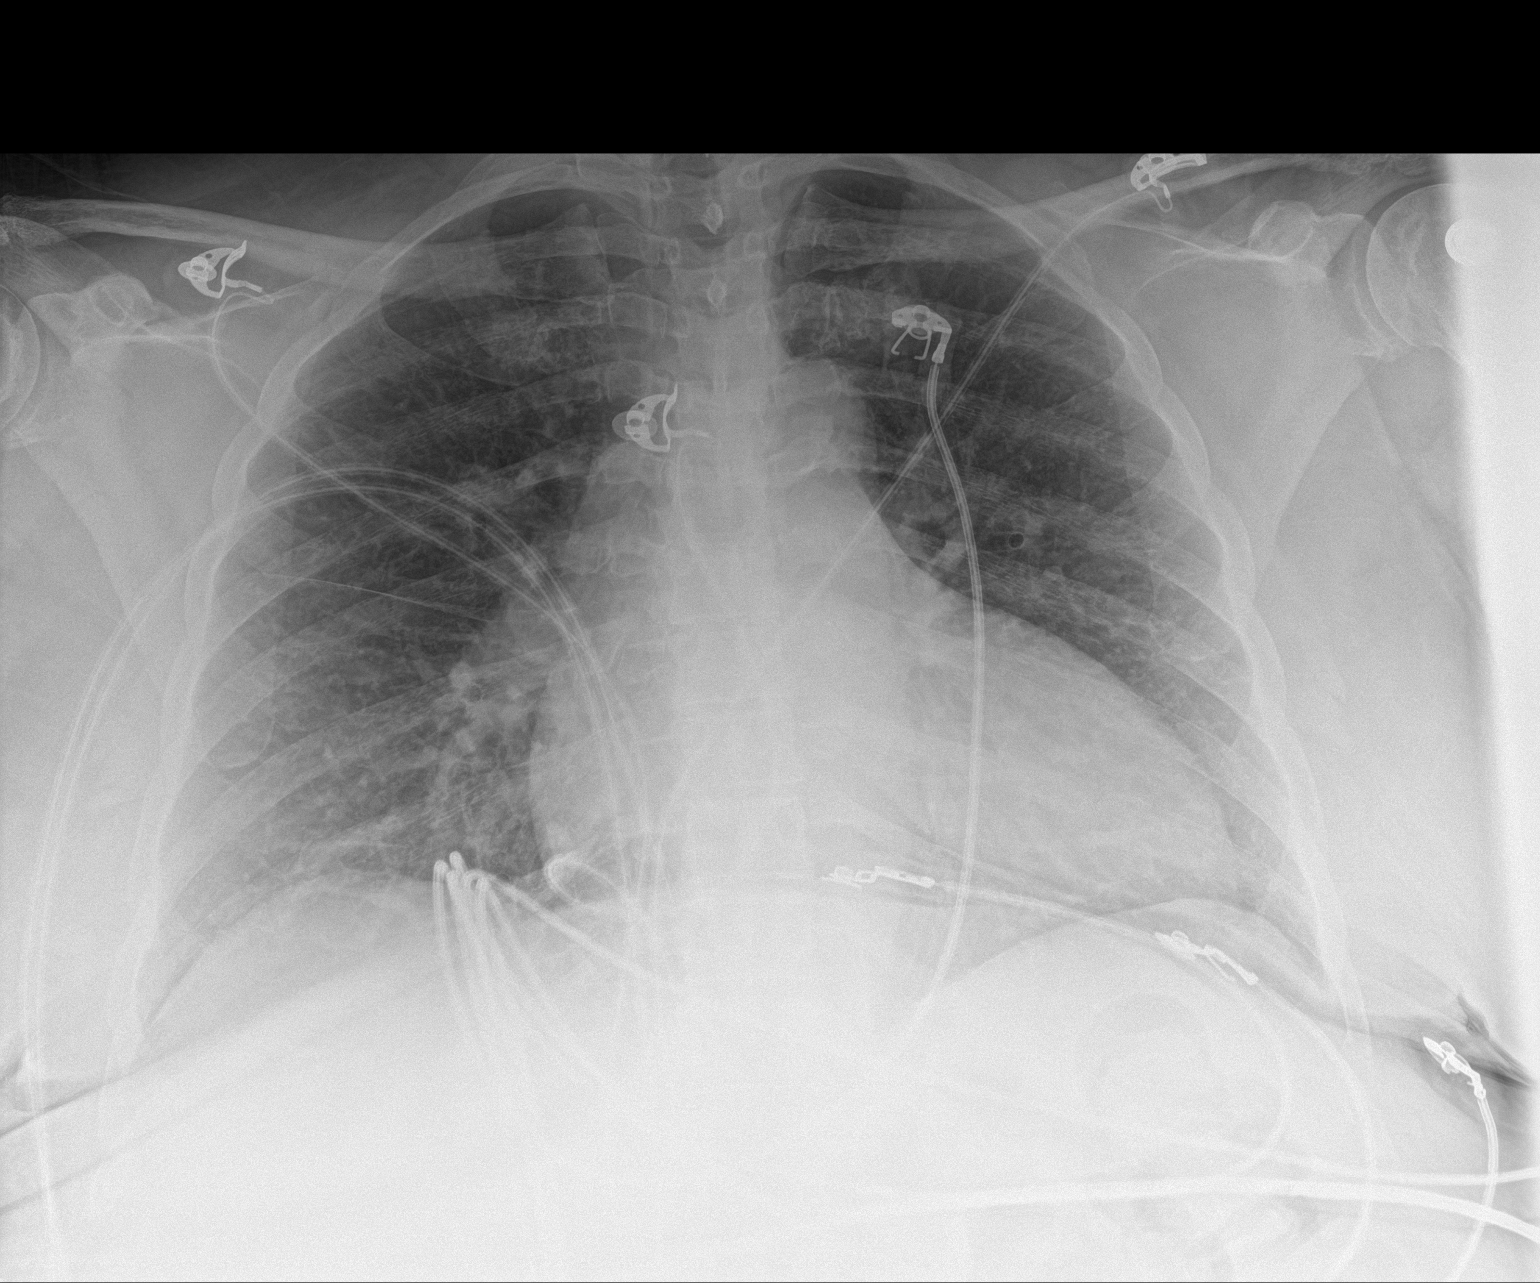

[2 of 2 positions shown; findings below may reference images not displayed]

FINDINGS: The cardiac silhouette is enlarged, a new finding. There is no focal
lung consolidation, pleural effusion or pneumothorax. The osseous
structures are within normal limits.
IMPRESSION: 1. Cardiac silhouette appears enlarged, a new finding. Cannot
exclude pericardial effusion.
2. The lungs are clear.

## 2021-12-19 NOTE — Telephone Encounter (Signed)
No return call yet to schedule. 

## 2021-12-24 ENCOUNTER — Telehealth: Payer: Self-pay

## 2021-12-24 NOTE — Telephone Encounter (Signed)
Pt called stating that she was swollen under her scar and it has gotten a little bigger.  Reviewed pt's chart, returned pt's call for clarification, two identifiers used. Pt states that she may have overdone it with doing some laundry and cleaning. She denies fever, hot to touch areas, or pain. She was instructed to monitor to infection symptoms and advised to elevate her arm and use warm compress. Reassured and reiterated appt dates and times. Confirmed understanding.

## 2021-12-25 ENCOUNTER — Other Ambulatory Visit: Payer: Self-pay | Admitting: *Deleted

## 2021-12-25 ENCOUNTER — Encounter: Payer: Self-pay | Admitting: Pharmacist

## 2021-12-25 DIAGNOSIS — N1832 Chronic kidney disease, stage 3b: Secondary | ICD-10-CM

## 2021-12-26 DIAGNOSIS — Z7682 Awaiting organ transplant status: Secondary | ICD-10-CM | POA: Diagnosis not present

## 2021-12-26 DIAGNOSIS — I5030 Unspecified diastolic (congestive) heart failure: Secondary | ICD-10-CM | POA: Diagnosis not present

## 2021-12-26 DIAGNOSIS — I2510 Atherosclerotic heart disease of native coronary artery without angina pectoris: Secondary | ICD-10-CM | POA: Diagnosis not present

## 2021-12-26 DIAGNOSIS — Z531 Procedure and treatment not carried out because of patient's decision for reasons of belief and group pressure: Secondary | ICD-10-CM | POA: Diagnosis not present

## 2021-12-26 DIAGNOSIS — I132 Hypertensive heart and chronic kidney disease with heart failure and with stage 5 chronic kidney disease, or end stage renal disease: Secondary | ICD-10-CM | POA: Diagnosis not present

## 2021-12-26 DIAGNOSIS — N189 Chronic kidney disease, unspecified: Secondary | ICD-10-CM | POA: Diagnosis not present

## 2021-12-26 DIAGNOSIS — N186 End stage renal disease: Secondary | ICD-10-CM | POA: Diagnosis not present

## 2021-12-26 DIAGNOSIS — Z01818 Encounter for other preprocedural examination: Secondary | ICD-10-CM | POA: Diagnosis not present

## 2021-12-26 DIAGNOSIS — G4733 Obstructive sleep apnea (adult) (pediatric): Secondary | ICD-10-CM | POA: Diagnosis not present

## 2021-12-26 DIAGNOSIS — Z1159 Encounter for screening for other viral diseases: Secondary | ICD-10-CM | POA: Diagnosis not present

## 2021-12-26 DIAGNOSIS — I10 Essential (primary) hypertension: Secondary | ICD-10-CM | POA: Diagnosis not present

## 2021-12-26 DIAGNOSIS — E1121 Type 2 diabetes mellitus with diabetic nephropathy: Secondary | ICD-10-CM | POA: Diagnosis not present

## 2021-12-26 DIAGNOSIS — E1122 Type 2 diabetes mellitus with diabetic chronic kidney disease: Secondary | ICD-10-CM | POA: Diagnosis not present

## 2021-12-26 DIAGNOSIS — E1142 Type 2 diabetes mellitus with diabetic polyneuropathy: Secondary | ICD-10-CM | POA: Diagnosis not present

## 2021-12-26 DIAGNOSIS — Z114 Encounter for screening for human immunodeficiency virus [HIV]: Secondary | ICD-10-CM | POA: Diagnosis not present

## 2021-12-29 DIAGNOSIS — E1122 Type 2 diabetes mellitus with diabetic chronic kidney disease: Secondary | ICD-10-CM | POA: Diagnosis not present

## 2021-12-29 DIAGNOSIS — I129 Hypertensive chronic kidney disease with stage 1 through stage 4 chronic kidney disease, or unspecified chronic kidney disease: Secondary | ICD-10-CM | POA: Diagnosis not present

## 2021-12-29 DIAGNOSIS — I5032 Chronic diastolic (congestive) heart failure: Secondary | ICD-10-CM | POA: Diagnosis not present

## 2021-12-29 DIAGNOSIS — N184 Chronic kidney disease, stage 4 (severe): Secondary | ICD-10-CM | POA: Diagnosis not present

## 2021-12-29 DIAGNOSIS — N2581 Secondary hyperparathyroidism of renal origin: Secondary | ICD-10-CM | POA: Diagnosis not present

## 2021-12-31 ENCOUNTER — Encounter (HOSPITAL_COMMUNITY): Payer: Medicaid Other

## 2022-01-05 DIAGNOSIS — E113592 Type 2 diabetes mellitus with proliferative diabetic retinopathy without macular edema, left eye: Secondary | ICD-10-CM | POA: Diagnosis not present

## 2022-01-05 DIAGNOSIS — E113511 Type 2 diabetes mellitus with proliferative diabetic retinopathy with macular edema, right eye: Secondary | ICD-10-CM | POA: Diagnosis not present

## 2022-01-05 DIAGNOSIS — H35371 Puckering of macula, right eye: Secondary | ICD-10-CM | POA: Diagnosis not present

## 2022-01-05 DIAGNOSIS — H4312 Vitreous hemorrhage, left eye: Secondary | ICD-10-CM | POA: Diagnosis not present

## 2022-01-06 ENCOUNTER — Ambulatory Visit (HOSPITAL_COMMUNITY)
Admission: RE | Admit: 2022-01-06 | Discharge: 2022-01-06 | Disposition: A | Discharge status: Home / Self Care | Payer: Medicaid Other | Source: Ambulatory Visit | Attending: Vascular Surgery | Admitting: Vascular Surgery

## 2022-01-06 ENCOUNTER — Telehealth: Payer: Self-pay | Admitting: Pharmacist

## 2022-01-06 DIAGNOSIS — E113513 Type 2 diabetes mellitus with proliferative diabetic retinopathy with macular edema, bilateral: Secondary | ICD-10-CM

## 2022-01-06 DIAGNOSIS — E1142 Type 2 diabetes mellitus with diabetic polyneuropathy: Secondary | ICD-10-CM

## 2022-01-06 DIAGNOSIS — N1832 Chronic kidney disease, stage 3b: Secondary | ICD-10-CM | POA: Diagnosis not present

## 2022-01-06 MED ORDER — OZEMPIC (1 MG/DOSE) 4 MG/3ML ~~LOC~~ SOPN
1.0000 mg | PEN_INJECTOR | SUBCUTANEOUS | 0 refills | Status: DC
  Filled 2022-03-16: qty 3, 28d supply, fill #0

## 2022-01-06 NOTE — Progress Notes (Unsigned)
POST OPERATIVE OFFICE NOTE    CC:  F/u for surgery  HPI:  This is a 61 y.o. 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 Donzetta Matters   Pt states she does not have pain/numbness in the left hand.    The pt is not on dialysis.  Dr. Royce Macadamia is her nephrologist.    Allergies  Allergen Reactions   Metformin And Related Nausea Only   Other     Clonidine patch causes skin irritation    Hydrocodone Itching    Patient able to tolerate when taken with Benadryl.   Oxycodone-Acetaminophen Itching    Patient able to tolerate when taken with Benadryl.    Current Outpatient Medications  Medication Sig Dispense Refill   Accu-Chek Softclix Lancets lancets 3 (three) times daily.     acetaminophen (TYLENOL) 500 MG tablet Take 1,000 mg by mouth every 6 (six) hours as needed for moderate pain or headache.     ACIDOPHILUS LACTOBACILLUS PO Take 1 Capful by mouth daily.     allopurinol (ZYLOPRIM) 100 MG tablet Take 100 mg by mouth in the morning.     aspirin EC 81 MG tablet Take 81 mg by mouth in the morning. Swallow whole.     atorvastatin (LIPITOR) 40 MG tablet Take 1 tablet (40 mg total) by mouth every evening. 90 tablet 3   buPROPion (WELLBUTRIN) 100 MG tablet Take 100 mg by mouth 2 (two) times daily.     carvedilol (COREG) 25 MG tablet Take 1 tablet (25 mg total) by mouth 2 (two) times daily with a meal. 60 tablet 0   cholecalciferol (VITAMIN D3) 25 MCG (1000 UNIT) tablet Take 2,000 Units by mouth daily.     cloNIDine (CATAPRES) 0.3 MG tablet Take 0.3 mg by mouth 3 (three) times daily.     diclofenac Sodium (VOLTAREN) 1 % GEL Apply 2 g topically 4 (four) times daily as needed (chest pain.).     ferrous sulfate 325 (65 FE) MG tablet Take 325 mg by mouth 2 (two) times daily.     fluticasone (FLONASE) 50 MCG/ACT nasal spray Place 1 spray into both nostrils daily as needed for allergies or rhinitis.     hydrALAZINE (APRESOLINE) 100 MG tablet Take 100 mg by mouth 3 (three) times daily.     insulin  glargine (LANTUS) 100 unit/mL SOPN Inject 10 Units into the skin at bedtime.     isosorbide mononitrate (IMDUR) 60 MG 24 hr tablet Take 1.5 tablets (90 mg total) by mouth daily. (Patient taking differently: Take 90 mg by mouth every evening.) 135 tablet 3   loratadine (CLARITIN) 10 MG tablet Take 10 mg by mouth in the morning.     Multiple Vitamin (MULTIVITAMIN ADULT) TABS Take 1 tablet by mouth in the morning.     NIFEdipine (ADALAT CC) 60 MG 24 hr tablet Take 2 tablets (120 mg total) by mouth daily. 60 tablet 11   nitroGLYCERIN (NITROSTAT) 0.4 MG SL tablet Place 1 tablet (0.4 mg total) under the tongue every 5 (five) minutes as needed for chest pain. 20 tablet 0   NOVOLOG FLEXPEN 100 UNIT/ML FlexPen Inject 4 Units into the skin 3 (three) times daily with meals.     Omega-3 Fatty Acids (FISH OIL) 1200 MG CAPS Take 1,200 mg by mouth in the morning.     oxyCODONE-acetaminophen (PERCOCET) 5-325 MG tablet Take 1 tablet by mouth every 6 (six) hours as needed for severe pain. 8 tablet 0   potassium  chloride SA (KLOR-CON M) 20 MEQ tablet Take 1 tablet (20 mEq total) by mouth daily. (Patient not taking: Reported on 12/03/2021) 30 tablet 3   predniSONE (DELTASONE) 20 MG tablet Take 20 mg by mouth daily as needed (gout).     pregabalin (LYRICA) 25 MG capsule Take 25 mg by mouth 2 (two) times daily.     Semaglutide,0.25 or 0.5MG /DOS, (OZEMPIC, 0.25 OR 0.5 MG/DOSE,) 2 MG/3ML SOPN Inject 0.5mg  under the skin once weekly, please contact the office when you have completed the three remaining doses of 0.5 to move to the next dose. (Patient taking differently: Inject 0.5 mg into the skin every Wednesday. Inject 0.5mg  under the skin once weekly, please contact the office when you have completed the three remaining doses of 0.5 to move to the next dose.) 1 mL 0   sertraline (ZOLOFT) 50 MG tablet Take 50 mg by mouth in the morning.     torsemide (DEMADEX) 20 MG tablet Take 2 tablets (40 mg total) by mouth daily for 3  days, THEN 1 tablet (20 mg total) daily. (Patient taking differently: 1 tablet by mouth (20 mg total) daily.) 90 tablet 3   traZODone (DESYREL) 50 MG tablet Take 50 mg by mouth at bedtime.     No current facility-administered medications for this visit.     ROS:  See HPI  Physical Exam:  Today's Vitals   01/07/22 1423  BP: (!) 146/74  Pulse: 63  Resp: 20  Temp: 98.4 F (36.9 C)  TempSrc: Temporal  SpO2: 98%  Weight: 226 lb 1.6 oz (102.6 kg)  Height: 5' 3.5" (1.613 m)   Body mass index is 39.42 kg/m.   Incision:  healed but with keloid  Extremities:   There is a palpable left radial and ulnar pulse.   Motor and sensory are in tact.   There is a thrill/bruit present.  The fistula is easily palpable   Dialysis Duplex on 01/06/2022: +------------+----------+-------------+----------+----------------+  OUTFLOW VEINPSV (cm/s)Diameter (cm)Depth (cm)    Describe      +------------+----------+-------------+----------+----------------+  Shoulder       177        0.60        1.37                     +------------+----------+-------------+----------+----------------+  Prox UA        382        0.55        0.71   competing branch  +------------+----------+-------------+----------+----------------+  Mid UA         318        0.63        0.39   competing branch  +------------+----------+-------------+----------+----------------+  Dist UA        241        0.83        0.23                     +------------+----------+-------------+----------+----------------+  AC Fossa       817        0.43        0.80                     +------------+----------+-------------+----------+----------------+   Assessment/Plan:  This is a 61 y.o. female who is s/p: Left BC AVF on 12/09/2021 by Dr. Donzetta Matters  -the fistula is maturing nicely.  It is easily palpable with an excellent thrill.  Discussed with her  that if they have trouble accessing it, she may need the side  branches ligated.   -the pt does not have evidence of steal. -the fistula can be used 03/11/2022. -discussed with pt that access does not last forever and will need intervention or even new access at some point.  -the pt will follow up as needed   Leontine Locket, [facility] [phone]  Clinic MD:  Scot Dock

## 2022-01-06 NOTE — Telephone Encounter (Signed)
Ozempic PA submitted again.   [id number].   PA not required. New rx sent to pharmacy

## 2022-01-07 ENCOUNTER — Ambulatory Visit (INDEPENDENT_AMBULATORY_CARE_PROVIDER_SITE_OTHER): Payer: Medicaid Other | Admitting: Physician Assistant

## 2022-01-07 VITALS — BP 146/74 | HR 63 | Temp 98.4°F | Resp 20 | Ht 63.5 in | Wt 226.1 lb

## 2022-01-07 DIAGNOSIS — I509 Heart failure, unspecified: Secondary | ICD-10-CM | POA: Diagnosis not present

## 2022-01-07 DIAGNOSIS — I10 Essential (primary) hypertension: Secondary | ICD-10-CM | POA: Diagnosis not present

## 2022-01-07 DIAGNOSIS — E1122 Type 2 diabetes mellitus with diabetic chronic kidney disease: Secondary | ICD-10-CM | POA: Diagnosis not present

## 2022-01-07 DIAGNOSIS — K5904 Chronic idiopathic constipation: Secondary | ICD-10-CM | POA: Diagnosis not present

## 2022-01-07 DIAGNOSIS — N1832 Chronic kidney disease, stage 3b: Secondary | ICD-10-CM | POA: Diagnosis not present

## 2022-01-13 DIAGNOSIS — G4733 Obstructive sleep apnea (adult) (pediatric): Secondary | ICD-10-CM | POA: Diagnosis not present

## 2022-01-13 DIAGNOSIS — Z419 Encounter for procedure for purposes other than remedying health state, unspecified: Secondary | ICD-10-CM | POA: Diagnosis not present

## 2022-01-19 ENCOUNTER — Other Ambulatory Visit: Payer: Self-pay | Admitting: Obstetrics and Gynecology

## 2022-01-19 DIAGNOSIS — N1832 Chronic kidney disease, stage 3b: Secondary | ICD-10-CM

## 2022-01-19 NOTE — Patient Outreach (Signed)
Medicaid Managed Care   Nurse Care Manager Note  01/19/2022 Name:  Maria Hudson MRN:  [id number] DOB:  [birth year]  Maria Hudson is an 61 y.o. year old female who is a primary patient of Maria Ebbs, MD.  The Medicaid Managed Care Coordination team was consulted for assistance with:    Chronic healthcare management needs, CAD, HTN, CKD, DM, OSA, chronic pain, depression, neuropathy, vision loss, CHF, retinopathy  Maria Hudson was given information about Medicaid Managed Care Coordination team services today. Maria Hudson Patient agreed to services and verbal consent obtained.  [name] for follow up visit in response to provider referral for case management and/or care coordination services.   Assessments/Interventions:  Review of past medical history, allergies, medications, health status, including review of consultants reports, laboratory and other test data, was performed as part of comprehensive evaluation and provision of chronic care management services.  SDOH (Social Determinants of Health) assessments and interventions performed: SDOH Interventions    Flowsheet Row Most Recent Value  SDOH Interventions   Stress Interventions Intervention Not Indicated  Transportation Interventions Intervention Not Indicated       Care Plan  Allergies  Allergen Reactions   Metformin And Related Nausea Only   Other     Clonidine patch causes skin irritation    Hydrocodone Itching    Patient able to tolerate when taken with Benadryl.   Oxycodone-Acetaminophen Itching    Patient able to tolerate when taken with Benadryl.   Medications Reviewed Today     Reviewed by Maria Medicus, RN (Registered Nurse) on 01/19/22 at [place] List Status: <None>   Medication Order Taking? Sig Documenting Provider Last Dose Status Informant  Accu-Chek Softclix Lancets lancets [id number] No 3 (three) times daily. [provider] Taking Active Self  acetaminophen  (TYLENOL) 500 MG tablet [id number] No Take 1,000 mg by mouth every 6 (six) hours as needed for moderate pain or headache. [provider] Taking Active Self  ACIDOPHILUS LACTOBACILLUS PO [id number] No Take 1 Capful by mouth daily. [provider] Taking Active Self  allopurinol (ZYLOPRIM) 100 MG tablet [id number] No Take 100 mg by mouth in the morning. [provider] Taking Active Self  aspirin EC 81 MG tablet [id number] No Take 81 mg by mouth in the morning. Swallow whole. [provider] Taking Active Self           Med Note Maria Hudson   Mon Apr 14, 2021  9:22 PM)    atorvastatin (LIPITOR) 40 MG tablet [id number] No Take 1 tablet (40 mg total) by mouth every evening. Maria Latch, MD Taking Active Self  buPROPion [facility]) 100 MG tablet [id number] No Take 100 mg by mouth 2 (two) times daily. [provider] Taking Active Self  carvedilol (COREG) 25 MG tablet [id number] No Take 1 tablet (25 mg total) by mouth 2 (two) times daily with a meal. Maria Hudson, Maria Picket, MD Taking Active Self  cholecalciferol (VITAMIN D3) 25 MCG (1000 UNIT) tablet [id number] No Take 2,000 Units by mouth daily. [provider] Taking Active Self  cloNIDine (CATAPRES) 0.3 MG tablet [id number] No Take 0.3 mg by mouth 3 (three) times daily. [provider] Taking Active Self           Med Note (Maria Hudson, Maria Hudson Jan 19, 2022  9:09 AM) Taking 0.2 mg tid  diclofenac Sodium (VOLTAREN) 1 % GEL [id number] No Apply 2 g topically 4 (four) times daily as needed (chest  pain.). [provider] Taking Active Self  ferrous sulfate 325 (65 FE) MG tablet [id number] No Take 325 mg by mouth 2 (two) times daily. [provider] Taking Active Self  fluticasone (FLONASE) 50 MCG/ACT nasal spray [id number] No Place 1 spray into both nostrils daily as needed for allergies or rhinitis. [provider] Taking Active Self  hydrALAZINE (APRESOLINE) 100 MG  tablet [id number] No Take 100 mg by mouth 3 (three) times daily. [provider] Taking Active Self  insulin glargine (LANTUS) 100 unit/mL SOPN [id number] No Inject 10 Units into the skin at bedtime. [provider] Taking Active Self           Med Note Maria Hudson   Tue Aug 19, 2021  9:37 AM)    isosorbide mononitrate (IMDUR) 60 MG 24 hr tablet [id number] No Take 1.5 tablets (90 mg total) by mouth daily.  Patient taking differently: Take 90 mg by mouth every evening.   Maria Hudson, Maria Hudson, Maria Hudson Taking Active Self  loratadine (CLARITIN) 10 MG tablet [id number] No Take 10 mg by mouth in the morning. [provider] Taking Active Self  Multiple Vitamin (MULTIVITAMIN ADULT) TABS [id number] No Take 1 tablet by mouth in the morning. [provider] Taking Active Self  NIFEdipine (ADALAT CC) 60 MG 24 hr tablet [id number] No Take 2 tablets (120 mg total) by mouth daily. Maria Dresser, MD Taking Active Self  nitroGLYCERIN (NITROSTAT) 0.4 MG SL tablet [id number] No Place 1 tablet (0.4 mg total) under the tongue every 5 (five) minutes as needed for chest pain. Maria Hudson, Maria Picket, MD Taking Active Self           Med Note Maria Hudson, Maria Hudson   Tue Aug 19, 2021  9:35 AM)    Maria Hudson FLEXPEN 100 UNIT/ML FlexPen [id number] No Inject 4 Units into the skin 3 (three) times daily with meals. [provider] Taking Active Self           Med Note ([place] Dec 09, 2021  8:58 AM) Patient took 2 units at 0400   Omega-3 Fatty Acids (FISH OIL) 1200 MG CAPS [id number] No Take 1,200 mg by mouth in the morning. [provider] Taking Active Self  oxyCODONE-acetaminophen (PERCOCET) 5-325 MG tablet [id number] No Take 1 tablet by mouth every 6 (six) hours as needed for severe pain. Baglia, Corrina, PA-C Taking Active   potassium chloride SA (KLOR-CON M) 20 MEQ tablet [id number] No Take 1 tablet (20 mEq total) by mouth daily.  Patient taking differently: Take  20 mEq by mouth daily. Takes three times a week   [place], Maria Hudson, [place] Taking Active Self  predniSONE (DELTASONE) 20 MG tablet [id number] No Take 20 mg by mouth daily as needed (gout). [provider] Taking Active Self  pregabalin (LYRICA) 25 MG capsule [id number] No Take 25 mg by mouth 2 (two) times daily. [provider] Taking Active Self  Semaglutide, 1 MG/DOSE, (OZEMPIC, 1 MG/DOSE,) 4 MG/3ML SOPN [id number] No Inject 1 mg into the skin once a week. Buford Dresser, MD Taking Active   sertraline (ZOLOFT) 50 MG tablet [id number] No Take 50 mg by mouth in the morning. [provider] Taking Active Self  torsemide (DEMADEX) 20 MG tablet [id number] No Take 2 tablets (40 mg total) by mouth daily for 3 days, THEN 1 tablet (20 mg total) daily.  Patient taking differently: 1 tablet by mouth (20 mg total) daily.   [place], Maria Hudson, Maria Hudson Taking  Active Self           Med Note (Anquinette Pierro, Maria Hudson Jan 19, 2022  9:21 AM) Taking 20 mg a day and every other day taking 40 mg  traZODone (DESYREL) 50 MG tablet [id number] No Take 50 mg by mouth at bedtime. [provider] Taking Active Self           Patient Active Problem List   Diagnosis Date Noted   Pain due to onychomycosis of toenails of both feet 12/05/2021   Pure hypercholesterolemia 05/27/2021   Chronic diastolic CHF (congestive heart failure) ([place]) 04/29/2021   Hypertensive urgency 04/14/2021   Acute on chronic diastolic CHF (congestive heart failure) ([place]) 04/14/2021   Chest pain 03/05/2021   CAD (coronary artery disease) 03/05/2021   T2DM (type 2 diabetes mellitus) ([place]) 03/05/2021   CKD (chronic kidney disease) stage 4, GFR 15-29 ml/min ([place]) 03/05/2021   Class 3 obesity ([place]) 03/05/2021   CVA (cerebral vascular accident) ([place]) 03/05/2021   Acute on chronic diastolic heart failure ([place]) 03/05/2021   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   AKI (acute kidney injury) ([place]) 03/05/2021   Vitreous  hemorrhage of left eye ([place]) 11/14/2020   Orthopnea 08/06/2020   Vision loss of right eye 08/06/2020   Excessive daytime sleepiness 01/11/2020   Iron deficiency anemia 12/14/2019   Vitreous hemorrhage, right ([place]) 12/04/2019   Diabetic neuropathy ([place]) 07/26/2019   Proliferative diabetic retinopathy of both 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 ([place]) 02/03/2019   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   Conductive hearing loss, bilateral 03/03/2018   Chronic bilateral low back pain without sciatica 08/21/2015   Chronic kidney disease (CKD) stage G3b/A3, moderately decreased glomerular filtration rate (GFR) between 30-44 mL/min/1.73 square meter and albuminuria creatinine ratio greater than 300 mg/g ([place]) 04/30/2015   Type 2 diabetes mellitus with diabetic polyneuropathy, with long-term current use of insulin ([place]) 02/15/2015   Advance directive on file 05/17/2014   Refusal of blood transfusions as patient is Jehovah's Witness 05/17/2014   OSA treated with BiPAP 02/23/2014   Coronary artery disease 07/07/2013   Severe obesity (BMI >= 40) ([place]) 05/18/2013   Neuropathy 38/25/0539   Diastolic heart failure ([place]) 10/16/2012   Essential hypertension 10/16/2012   Conditions to be addressed/monitored per PCP order:  Chronic healthcare management needs, CAD, HTN, CKD, DM, OSA, chronic pain, depression, neuropathy, vision loss, CHF, retinopathy  Care Plan : RN Care Manager Plan Of Care  Updates made by Maria Medicus, RN since 01/19/2022 12:00 AM     Problem: Knowledge Deficit and Care Coordination Needs Related to Management of HTN, HF, Type 2 DM   Priority: High     Long-Range Goal: Development of Plan of Care to Address Knowledge Deficits and Care Coordination Needs related to  management of CHF, CAD, HTN, HLD, DMII, CKD Stage 4, and Depression   Start Date: 05/06/2021  Expected End Date: 05/06/2022  Priority: High  Note:   Current Barriers:  Knowledge Deficits  related to plan of care for management of CHF, CAD, HTN, HLD, DMII, CKD Stage 4, and Depression 01/19/22:  Patient requests dental resources today.  BP elevated in the mornings-147-202/72-84. Nephrologist recently made medication adjustment-patient states she will follow up with provider.  CBGs WNL.  Patient needs assistance with [place] appt-unable to reach or anyone return her phone call to schedule an appt with [employer].  Recent f/u appts with Vascular and Podiatry-to see eye provider soon.  RNCM Clinical  Goal(s):  Patient will demonstrate ongoing adherence to prescribed treatment plan for CHF, CAD, HTN, HLD, DMII, CKD Stage 4, and Depression as evidenced by patient report of improved health and quality of life and no unplanned ED or unplanned hospital admissions continue to work with RN Care Manager and/or Social Worker to address care management and care coordination needs related to CHF, CAD, HTN, HLD, DMII, CKD Stage 4, and Depression as evidenced by adherence to CM Team Scheduled appointments      Work with [facility] LCSW and psychiatrist to treat depression  Interventions: Inter-disciplinary care team collaboration (see longitudinal plan of care) Evaluation of current treatment plan related to  self management and patient's adherence to plan as established by provider Mailed summary of benefits for her [place] Medicaid plan to her home address Collaborated with Care Guide for dental resources. Care Guide referral for dental resources Collaborated with BSW for assistance with [facility] appt. BSW referral for appt assistance.   Heart Failure Interventions:  (Status: Goal on Track (progressing): YES.)  Long Term Goal - patient states her fluid retention has significantly improved,  says she cannot tolerate the compression stockings because they are too tight and make her legs hurt Assessed patient's heart failure management strategies  Diabetes:  (Status: Goal on Track (progressing):  YES.) Long Term Goal - patient says her CBGS have been quite variable and she is not sure why, denies hypoglycemia or sustained hyperglycemia  Lab Results  Component Value Date   HGBA1C 6.7 (H) 04/29/2021   Hgn A1C=6.3 on 12/26/21 Assessed patient's understanding of A1C goal: <7% Reviewed medications with patient and discussed importance of medication adherence;        Counseled on importance of regular laboratory monitoring as prescribed;        Discussed plans with patient for ongoing care management follow up and provided patient with direct contact information for care management team;      Advised patient, providing education and rationale, to check cbg three times daily and record        call provider for findings outside established parameters;       Review of patient status, including review of consultants reports, relevant laboratory and other test results, and medications completed;       Discussed addition of Ozempic to medication regimen , reviewed mechanism of action and proven cardiovascular benefits  Hypertension and CKD stage 4 : (Status: Goal on Track (progressing): YES.) Long Term Goal-  has been placed on kidney transplant waiting list, fistu;a placed 12/09/21,ready for use 03/11/22. Last practice recorded BP readings:   BP Readings from Last 3 Encounters:  09/30/21 120/70  09/12/21 (!) 107/58  09/08/21 (!) 144/71     Most recent eGFR/CrCl:    No components found for: CRCL Lab Results  Component Value Date   NA 136 08/19/2021   K 3.9 08/19/2021   CREATININE 2.51 (H) 08/19/2021   EGFR 19 (L) 03/13/2021   GFRNONAA 22 (L) 08/19/2021   GLUCOSE 122 (H) 08/19/2021    Evaluation of current treatment plan related to hypertension self management and patient's adherence to plan as established by provider;   Reviewed medications with patient and discussed importance of compliance;  Discussed plans with patient for ongoing care management follow up and provided patient with  direct contact information for care management team; Advised patient, providing education and rationale, to monitor blood pressure daily and record, calling PCP for findings outside established parameters;  Reviewed scheduled/upcoming provider appointments  Patient  with labile blood pressures, medication adjusted, patient ot f/u with nephrologist.  Weight Loss Interventions:  (Status:  New goal.) Long Term Goal--patient has started Ozempic Discussed indications, mechanism of action, common side effects and CV benefits  of GLP1 Ozempic Assessed tolerance of Ozempic  Interdisciplinary Collaboration:  (Status: Goal on Track (progressing): YES.) Long Term Goal  Collaborated with BSW to initiate plan of care to address needs related to Housing barriers, Inability to perform ADL's independently, and Inability to perform IADL's independently in patient with CHF, CAD, HTN, HLD, DM, CKD Stage 4, and Depression Assessed status of patient's involvement with behavioral health counselor Collaboration with pharmacist for medication review, discussion of ? Medication side effects causing chronic constipation and arranging for patient to received home delivery of medications in blister packs Collaboration with Maria Ebbs, MD regarding development and update of comprehensive plan of care as evidenced by provider attestation and co-signature Inter-disciplinary care team collaboration (see longitudinal plan of care)  Patient Goals/Self-Care Activities: Take medications as prescribed   Attend all scheduled provider appointments Call pharmacy for medication refills 3-7 days in advance of running out of medications Attend church or other social activities Perform all self care activities independently  Call provider office for new concerns or questions  Work with the social worker to address care coordination needs and will continue to work with the clinical team to address health care and disease management  related needs call office if I gain more than 2 pounds in one day or 5 pounds in one week keep legs up while sitting track weight in diary use salt in moderation watch for swelling in feet, ankles and legs every day weigh myself daily know when to call the doctorfor fluid weight gain or consistently abnormal BP readings at home track symptoms and what helps feel better or worse check blood sugar at prescribed times: three times daily enter blood sugar readings and medication or insulin into daily log take the blood sugar log to all doctor visits take the blood sugar meter to all doctor visits eat fish at least once per week fill half of plate with vegetables manage portion size check blood pressure daily write blood pressure results in a log or diary learn about high blood pressure take blood pressure log to all doctor appointments call doctor for signs and symptoms of high blood pressure keep all doctor appointments take medications for blood pressure exactly as prescribed Limit fluid intake to 1 liter per day Collaborate with managed Medicaid team of BSW and pharmacist to address identified needs Work with cardiology pharmacist to assist with BP stabilization Work with [facility] LCSW and psychiatrist to treat depression Review summary of [employer] benefits mailed to home address and call to enroll in program that will benefit your chronic health issues Continue to use [place] [facility] calendar to record weight , BP and CBGs so readings are all in one place Schedule dental appt.   Follow Up:  Patient agrees to Care Plan and Follow-up.  Plan: The Managed Medicaid care management team will reach out to the patient again over the next 30 business  days. and The  Patient has been provided with contact information for the Managed Medicaid care management team and has been advised to call with any health related questions or concerns.  Date/time of next scheduled RN  care management/care coordination outreach: 02/25/22 at 315

## 2022-01-19 NOTE — Patient Instructions (Signed)
Hi Maria Hudson, great to speak with you today-have a wonderful day and week!  Maria Hudson was given information about Medicaid Managed Care team care coordination services as a part of their [facility] Medicaid benefit. Maria Hudson verbally consented to engagement with the [facility] Managed Care team.   If you are experiencing a medical emergency, please call 911 or report to your local emergency department or urgent care.   If you have a non-emergency medical problem during routine business hours, please contact your provider's office and ask to speak with a nurse.   For questions related to your [facility] health plan, please call: [phone] or go here:[email or web address]  If you would like to schedule transportation through your [facility] plan, please call the following number at least 2 days in advance of your appointment: [phone].  You can also use the MTM portal or MTM mobile app to manage your rides. For the portal, please go to [email or web address].  Call the [place] at [phone], at any time, 24 hours a day, 7 days a week. If you are in danger or need immediate medical attention call 911.  If you would like help to quit smoking, call 1-800-QUIT-NOW (336) 266-9521) OR Espaol: 1-855-Djelo-Ya (4-132-440-1027) o para ms informacin haga clic aqu or Text READY to 200-400 to register via text  Maria Hudson - following are the goals we discussed in your visit today:   Goals Addressed    Timeframe:  Long-Range Goal Priority:  High Start Date:     11/12/21                        Expected End Date:  ongoing                     Follow Up Date: 01/19/22   - schedule appointment for vaccines needed due to my age or health - schedule recommended health tests (blood work, mammogram, colonoscopy, pap test) - schedule and keep appointment for annual check-up    Why is this important?   Screening tests can find diseases early when they are easier to  treat.  Your doctor or nurse will talk with you about which tests are important for you.  Getting shots for common diseases like the flu and shingles will help prevent them.  12/11/21:  patient seen and evaluated by [facility] 11/19/21, fistula placed 6/2  Patient verbalizes understanding of instructions and care plan provided today and agrees to view in [place]. Active MyChart status and patient understanding of how to access instructions and care plan via MyChart confirmed with patient.     The Managed Medicaid care management team will reach out to the patient again over the next 30 business  days.  The  Patient has been provided with contact information for the Managed Medicaid care management team and has been advised to call with any health related questions or concerns.   Aida Raider RN, BSN [place] Management Coordinator - Managed Medicaid High Risk [phone]   Following is a copy of your plan of care:  Care Plan : [place]  Updates made by Gayla Medicus, RN since 01/19/2022 12:00 AM     Problem: Knowledge Deficit and Care Coordination Needs Related to Management of HTN, HF, Type 2 DM   Priority: High     Long-Range Goal: Development of Plan of Care to Address Knowledge Deficits and Care Coordination Needs  related to  management of CHF, CAD, HTN, HLD, DMII, CKD Stage 4, and Depression   Start Date: 05/06/2021  Expected End Date: 05/06/2022  Priority: High  Note:   Current Barriers:  Knowledge Deficits related to plan of care for management of CHF, CAD, HTN, HLD, DMII, CKD Stage 4, and Depression 01/19/22:  Patient requests dental resources today.  BP elevated in the mornings-[id number]/72-84. Nephrologist recently made medication adjustment-patient states she will follow up with provider.  CBGs WNL.  Patient needs assistance with [place] appt-unable to reach or anyone return her phone call to schedule an appt with [employer].  Recent  f/u appts with Vascular and Podiatry-to see eye provider soon.  RNCM Clinical Goal(s):  Patient will demonstrate ongoing adherence to prescribed treatment plan for CHF, CAD, HTN, HLD, DMII, CKD Stage 4, and Depression as evidenced by patient report of improved health and quality of life and no unplanned ED or unplanned hospital admissions continue to work with RN Care Manager and/or Social Worker to address care management and care coordination needs related to CHF, CAD, HTN, HLD, DMII, CKD Stage 4, and Depression as evidenced by adherence to CM Team Scheduled appointments      Work with [facility] LCSW and psychiatrist to treat depression  Interventions: Inter-disciplinary care team collaboration (see longitudinal plan of care) Evaluation of current treatment plan related to  self management and patient's adherence to plan as established by provider Mailed summary of benefits for her [place] Medicaid plan to her home address Collaborated with Care Guide for dental resources. Care Guide referral for dental resources Collaborated with BSW for assistance with [facility] [place] appt. BSW referral for appt assistance.   Heart Failure Interventions:  (Status: Goal on Track (progressing): YES.)  Long Term Goal - patient states her fluid retention has significantly improved,  says she cannot tolerate the compression stockings because they are too tight and make her legs hurt Assessed patient's heart failure management strategies  Diabetes:  (Status: Goal on Track (progressing): YES.) Long Term Goal - patient says her CBGS have been quite variable and she is not sure why, denies hypoglycemia or sustained hyperglycemia  Lab Results  Component Value Date   HGBA1C 6.7 (H) 04/29/2021   Hgn A1C=6.3 on 12/26/21 Assessed patient's understanding of A1C goal: <7% Reviewed medications with patient and discussed importance of medication adherence;        Counseled on importance of regular laboratory monitoring  as prescribed;        Discussed plans with patient for ongoing care management follow up and provided patient with direct contact information for care management team;      Advised patient, providing education and rationale, to check cbg three times daily and record        call provider for findings outside established parameters;       Review of patient status, including review of consultants reports, relevant laboratory and other test results, and medications completed;       Discussed addition of Ozempic to medication regimen , reviewed mechanism of action and proven cardiovascular benefits  Hypertension and CKD stage 4 : (Status: Goal on Track (progressing): YES.) Long Term Goal-  has been placed on kidney transplant waiting list, fistu;a placed 12/09/21,ready for use 03/11/22. Last practice recorded BP readings:   BP Readings from Last 3 Encounters:  09/30/21 120/70  09/12/21 (!) 107/58  09/08/21 (!) 144/71     Most recent eGFR/CrCl:    No components found for: CRCL Lab Results  Component Value Date   NA 136 08/19/2021   K 3.9 08/19/2021   CREATININE 2.51 (H) 08/19/2021   EGFR 19 (L) 03/13/2021   GFRNONAA 22 (L) 08/19/2021   GLUCOSE 122 (H) 08/19/2021    Evaluation of current treatment plan related to hypertension self management and patient's adherence to plan as established by provider;   Reviewed medications with patient and discussed importance of compliance;  Discussed plans with patient for ongoing care management follow up and provided patient with direct contact information for care management team; Advised patient, providing education and rationale, to monitor blood pressure daily and record, calling PCP for findings outside established parameters;  Reviewed scheduled/upcoming provider appointments  Patient with labile blood pressures, medication adjusted, patient ot f/u with nephrologist.  Weight Loss Interventions:  (Status:  New goal.) Long Term Goal--patient has  started Ozempic Discussed indications, mechanism of action, common side effects and CV benefits  of GLP1 Ozempic Assessed tolerance of Ozempic  Interdisciplinary Collaboration:  (Status: Goal on Track (progressing): YES.) Long Term Goal  Collaborated with BSW to initiate plan of care to address needs related to Housing barriers, Inability to perform ADL's independently, and Inability to perform IADL's independently in patient with CHF, CAD, HTN, HLD, DM, CKD Stage 4, and Depression Assessed status of patient's involvement with behavioral health counselor Collaboration with pharmacist for medication review, discussion of ? Medication side effects causing chronic constipation and arranging for patient to received home delivery of medications in blister packs Collaboration with Nolene Ebbs, MD regarding development and update of comprehensive plan of care as evidenced by provider attestation and co-signature Inter-disciplinary care team collaboration (see longitudinal plan of care)  Patient Goals/Self-Care Activities: Take medications as prescribed   Attend all scheduled provider appointments Call pharmacy for medication refills 3-7 days in advance of running out of medications Attend church or other social activities Perform all self care activities independently  Call provider office for new concerns or questions  Work with the social worker to address care coordination needs and will continue to work with the clinical team to address health care and disease management related needs call office if I gain more than 2 pounds in one day or 5 pounds in one week keep legs up while sitting track weight in diary use salt in moderation watch for swelling in feet, ankles and legs every day weigh myself daily know when to call the doctorfor fluid weight gain or consistently abnormal BP readings at home track symptoms and what helps feel better or worse check blood sugar at prescribed times: three  times daily enter blood sugar readings and medication or insulin into daily log take the blood sugar log to all doctor visits take the blood sugar meter to all doctor visits eat fish at least once per week fill half of plate with vegetables manage portion size check blood pressure daily write blood pressure results in a log or diary learn about high blood pressure take blood pressure log to all doctor appointments call doctor for signs and symptoms of high blood pressure keep all doctor appointments take medications for blood pressure exactly as prescribed Limit fluid intake to 1 liter per day Collaborate with managed Medicaid team of BSW and pharmacist to address identified needs Work with cardiology pharmacist to assist with BP stabilization Work with [facility] LCSW and psychiatrist to treat depression Review summary of Wellcare benefits mailed to home address and call to enroll in program that will benefit your chronic health issues Continue to use  [place] Management calendar to record weight , BP and CBGs so readings are all in one place Schedule dental appt.

## 2022-01-20 ENCOUNTER — Telehealth: Payer: Self-pay

## 2022-01-20 ENCOUNTER — Other Ambulatory Visit: Payer: Self-pay

## 2022-01-20 NOTE — Patient Instructions (Signed)
Visit Information  Maria Hudson was given information about Medicaid Managed Care team care coordination services as a part of their [facility] Medicaid benefit. Maria Hudson verbally consented to engagement with the [facility] Managed Care team.   If you are experiencing a medical emergency, please call 911 or report to your local emergency department or urgent care.   If you have a non-emergency medical problem during routine business hours, please contact your provider's office and ask to speak with a nurse.   For questions related to your [facility] health plan, please call: [phone] or go here:[email or web address]  If you would like to schedule transportation through your [facility] plan, please call the following number at least 2 days in advance of your appointment: [phone].  You can also use the MTM portal or MTM mobile app to manage your rides. For the portal, please go to [email or web address].  Call the [place] at [phone], at any time, 24 hours a day, 7 days a week. If you are in danger or need immediate medical attention call 911.  If you would like help to quit smoking, call 1-800-QUIT-NOW 410-639-0422) OR Espaol: 1-855-Djelo-Ya (5-631-497-0263) o para ms informacin haga clic aqu or Text READY to 200-400 to register via text  Maria Hudson - following are the goals we discussed in your visit today:   Goals Addressed   None       Social Worker will follow up in 30 days .   Mickel Fuchs, BSW, [place] Managed Medicaid Team  [phone]   Following is a copy of your plan of care:  There are no care plans that you recently modified to display for this patient.

## 2022-01-20 NOTE — Patient Outreach (Signed)
Medicaid Managed Care Social Work Note  01/20/2022 Name:  Maria Hudson MRN:  [id number] DOB:  Apr 01, 1961  Maria Hudson is an 61 y.o. year old female who is a primary patient of Maria Ebbs, MD.  The Medicaid Managed Care Coordination team was consulted for assistance with:  [place] and Resources  Maria Hudson was given information about Medicaid Managed Care Coordination team services today. Maria Hudson Patient agreed to services and verbal consent obtained.  Engaged with patient  for by telephone forinitial visit in response to referral for case management and/or care coordination services.   Assessments/Interventions:  Review of past medical history, allergies, medications, health status, including review of consultants reports, laboratory and other test data, was performed as part of comprehensive evaluation and provision of chronic care management services.  SDOH: (Social Determinant of Health) assessments and interventions performed: BSW completed a telephone outreach with patient. She stated she last spoke with Maria Hudson with [facility] in May 2023.She has called and left message for a new appointment but has not heard from anyone. BSW contacted [facility] and spoke with Maria Hudson she made a call to [facility] and she stated she would be giving her a call during a walk in times on Wednesday afternoon (did not give a specific time) BSW contacted patient back and provided her with the information. Patient stated dental resources were also needed. BSW emailed a copy of dentist accepting [facility] to patient at [email or web address].  Advanced Directives Status:  Not addressed in this encounter.  Care Plan                 Allergies  Allergen Reactions   Metformin And Related Nausea Only   Other     Clonidine patch causes skin irritation    Hydrocodone Itching    Patient able to tolerate when taken with Benadryl.   Oxycodone-Acetaminophen Itching    Patient able to tolerate when  taken with Benadryl.    Medications Reviewed Today     Reviewed by Maria Medicus, RN (Registered Nurse) on 01/19/22 at [place] List Status: <None>   Medication Order Taking? Sig Documenting Provider Last Dose Status Informant  Accu-Chek Softclix Lancets lancets [id number] No 3 (three) times daily. [provider] Taking Active Self  acetaminophen (TYLENOL) 500 MG tablet [id number] No Take 1,000 mg by mouth every 6 (six) hours as needed for moderate pain or headache. [provider] Taking Active Self  ACIDOPHILUS LACTOBACILLUS PO [id number] No Take 1 Capful by mouth daily. [provider] Taking Active Self  allopurinol (ZYLOPRIM) 100 MG tablet [id number] No Take 100 mg by mouth in the morning. [provider] Taking Active Self  aspirin EC 81 MG tablet [id number] No Take 81 mg by mouth in the morning. Swallow whole. [provider] Taking Active Self           Med Note [name]   Mon Apr 14, 2021  9:22 PM)    atorvastatin (LIPITOR) 40 MG tablet [id number] No Take 1 tablet (40 mg total) by mouth every evening. Skeet Latch, MD Taking Active Self  buPROPion [facility]) 100 MG tablet [id number] No Take 100 mg by mouth 2 (two) times daily. [provider] Taking Active Self  carvedilol (COREG) 25 MG tablet [id number] No Take 1 tablet (25 mg total) by mouth 2 (two) times daily with a meal. [name], [name], MD Taking Active Self  cholecalciferol (VITAMIN D3) 25 MCG (1000 UNIT) tablet [id number] No Take 2,000 Units by mouth  daily. [provider] Taking Active Self  cloNIDine (CATAPRES) 0.3 MG tablet [id number] No Take 0.3 mg by mouth 3 (three) times daily. [provider] Taking Active Self           Med Note (CRAFT, Shari Prows Jan 19, 2022  9:09 AM) Taking 0.2 mg tid  diclofenac Sodium (VOLTAREN) 1 % GEL [id number] No Apply 2 g topically 4 (four) times daily as needed (chest pain.). [provider]  Taking Active Self  ferrous sulfate 325 (65 FE) MG tablet [id number] No Take 325 mg by mouth 2 (two) times daily. [provider] Taking Active Self  fluticasone (FLONASE) 50 MCG/ACT nasal spray [id number] No Place 1 spray into both nostrils daily as needed for allergies or rhinitis. [provider] Taking Active Self  hydrALAZINE (APRESOLINE) 100 MG tablet [id number] No Take 100 mg by mouth 3 (three) times daily. [provider] Taking Active Self  insulin glargine (LANTUS) 100 unit/mL SOPN [id number] No Inject 10 Units into the skin at bedtime. [provider] Taking Active Self           Med Note [facility] [place]   Tue Aug 19, 2021  9:37 AM)    isosorbide mononitrate (IMDUR) 60 MG 24 hr tablet [id number] No Take 1.5 tablets (90 mg total) by mouth daily.  Patient taking differently: Take 90 mg by mouth every evening.   Avis, Maricela Bo, FNP Taking Active Self  loratadine (CLARITIN) 10 MG tablet [id number] No Take 10 mg by mouth in the morning. [provider] Taking Active Self  Multiple Vitamin (MULTIVITAMIN ADULT) TABS [id number] No Take 1 tablet by mouth in the morning. [provider] Taking Active Self  NIFEdipine (ADALAT CC) 60 MG 24 hr tablet [id number] No Take 2 tablets (120 mg total) by mouth daily. Larey Dresser, MD Taking Active Self  nitroGLYCERIN (NITROSTAT) 0.4 MG SL tablet [id number] No Place 1 tablet (0.4 mg total) under the tongue every 5 (five) minutes as needed for chest pain. [name], [name], MD Taking Active Self           Med Note Gilman Buttner, Juanita Laster   Tue Aug 19, 2021  9:35 AM)    Cira Servant FLEXPEN 100 UNIT/ML FlexPen [id number] No Inject 4 Units into the skin 3 (three) times daily with meals. [provider] Taking Active Self           Med Note ([place] Dec 09, 2021  8:58 AM) Patient took 2 units at 0400   Omega-3 Fatty Acids (FISH OIL) 1200 MG CAPS [id number] No Take 1,200 mg by mouth in  the morning. [provider] Taking Active Self  oxyCODONE-acetaminophen (PERCOCET) 5-325 MG tablet [id number] No Take 1 tablet by mouth every 6 (six) hours as needed for severe pain. Baglia, Corrina, PA-C Taking Active   potassium chloride SA (KLOR-CON M) 20 MEQ tablet [id number] No Take 1 tablet (20 mEq total) by mouth daily.  Patient taking differently: Take 20 mEq by mouth daily. Takes three times a week   [place], Maricela Bo, [place] Taking Active Self  predniSONE (DELTASONE) 20 MG tablet [id number] No Take 20 mg by mouth daily as needed (gout). [provider] Taking Active Self  pregabalin (LYRICA) 25 MG capsule [id number] No Take 25 mg by mouth 2 (two) times daily. [provider] Taking Active Self  Semaglutide, 1 MG/DOSE, (OZEMPIC, 1 MG/DOSE,) 4 MG/3ML SOPN [id number] No Inject 1 mg into the  skin once a week. Buford Dresser, MD Taking Active   sertraline (ZOLOFT) 50 MG tablet [id number] No Take 50 mg by mouth in the morning. [provider] Taking Active Self  torsemide (DEMADEX) 20 MG tablet [id number] No Take 2 tablets (40 mg total) by mouth daily for 3 days, THEN 1 tablet (20 mg total) daily.  Patient taking differently: 1 tablet by mouth (20 mg total) daily.   [place], Maricela Bo, FNP Taking Active Self           Med Note (CRAFT, Shari Prows Jan 19, 2022  9:21 AM) Taking 20 mg a day and every other day taking 40 mg  traZODone (DESYREL) 50 MG tablet [id number] No Take 50 mg by mouth at bedtime. [provider] Taking Active Self            Patient Active Problem List   Diagnosis Date Noted   Pain due to onychomycosis of toenails of both feet 12/05/2021   Pure hypercholesterolemia 05/27/2021   Chronic diastolic CHF (congestive heart failure) ([place]) 04/29/2021   Hypertensive urgency 04/14/2021   Acute on chronic diastolic CHF (congestive heart failure) ([place]) 04/14/2021   Chest pain 03/05/2021   CAD (coronary artery disease) 03/05/2021    T2DM (type 2 diabetes mellitus) ([place]) 03/05/2021   CKD (chronic kidney disease) stage 4, GFR 15-29 ml/min ([place]) 03/05/2021   Class 3 obesity ([place]) 03/05/2021   CVA (cerebral vascular accident) ([place]) 03/05/2021   Acute on chronic diastolic heart failure ([place]) 03/05/2021   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   AKI (acute kidney injury) ([place]) 03/05/2021   Vitreous hemorrhage of left eye ([place]) 11/14/2020   Orthopnea 08/06/2020   Vision loss of right eye 08/06/2020   Excessive daytime sleepiness 01/11/2020   Iron deficiency anemia 12/14/2019   Vitreous hemorrhage, right ([place]) 12/04/2019   Diabetic neuropathy ([place]) 07/26/2019   Proliferative diabetic retinopathy of both 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 ([place]) 02/03/2019   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   Conductive hearing loss, bilateral 03/03/2018   Chronic bilateral low back pain without sciatica 08/21/2015   Chronic kidney disease (CKD) stage G3b/A3, moderately decreased glomerular filtration rate (GFR) between 30-44 mL/min/1.73 square meter and albuminuria creatinine ratio greater than 300 mg/g ([place]) 04/30/2015   Type 2 diabetes mellitus with diabetic polyneuropathy, with long-term current use of insulin ([place]) 02/15/2015   Advance directive on file 05/17/2014   Refusal of blood transfusions as patient is Jehovah's Witness 05/17/2014   OSA treated with BiPAP 02/23/2014   Coronary artery disease 07/07/2013   Severe obesity (BMI >= 40) ([place]) 05/18/2013   Neuropathy 95/28/4132   Diastolic heart failure ([place]) 10/16/2012   Essential hypertension 10/16/2012    Conditions to be addressed/monitored per PCP order:   mental health  There are no care plans that you recently modified to display for this patient.   Follow up:  Patient agrees to Care Plan and Follow-up.  Plan: The Managed Medicaid care management team will reach out to the patient again over the next 30 days.  Date/time of next scheduled  Social Work care management/care coordination outreach:  02/20/22  Mickel Fuchs, Arita Miss, [place] Managed Medicaid Team  [phone]

## 2022-01-20 NOTE — Telephone Encounter (Signed)
   Telephone encounter was:  Unsuccessful.  01/20/2022 Name: [name] MRN: [id number] DOB: 1960/10/06  Unsuccessful outbound call made today to assist with:   [place] Medicaid [facility] dental provider list  Outreach Attempt:  1st Attempt  A HIPAA compliant voice message was left requesting a return call.  Instructed patient to call back at [phone].  Milagros Middendorf, AAS Paralegal, [place] Management  [address], [place] [address] ??[email or web address]  ?? [phone]   [email or web address]

## 2022-01-22 ENCOUNTER — Telehealth: Payer: Self-pay

## 2022-01-22 NOTE — Telephone Encounter (Signed)
   Telephone encounter was:  Unsuccessful.  01/22/2022 Name: Maria Hudson MRN: [id number] DOB: 06-23-1960  Unsuccessful outbound call made today to assist with:   dental provider list.  Outreach Attempt:  2nd Attempt  A HIPAA compliant voice message was left requesting a return call.  Instructed patient to call back at [phone].  Arwilda [place], AAS Paralegal, [place] Management  [address], [place] [address] ??[email or web address]  ?? [phone]   [email or web address]

## 2022-01-26 DIAGNOSIS — E113592 Type 2 diabetes mellitus with proliferative diabetic retinopathy without macular edema, left eye: Secondary | ICD-10-CM | POA: Diagnosis not present

## 2022-01-27 ENCOUNTER — Telehealth: Payer: Self-pay

## 2022-01-27 NOTE — Telephone Encounter (Signed)
   Telephone encounter was:  Unsuccessful.  01/27/2022 Name: Maria Hudson MRN: [id number] DOB: 17-Apr-1961  Unsuccessful outbound call made today to assist with:   dental provider list.  Outreach Attempt:  3rd Attempt.  Referral closed unable to contact patient.  A HIPAA compliant voice message was left requesting a return call.  Instructed patient to call back at [phone].  Drue Camera, AAS Paralegal, [facility]  [address], [place] [address] ??[email or web address]  ?? [phone]   [email or web address]

## 2022-02-09 DIAGNOSIS — N1832 Chronic kidney disease, stage 3b: Secondary | ICD-10-CM | POA: Diagnosis not present

## 2022-02-09 DIAGNOSIS — I509 Heart failure, unspecified: Secondary | ICD-10-CM | POA: Diagnosis not present

## 2022-02-09 DIAGNOSIS — E1122 Type 2 diabetes mellitus with diabetic chronic kidney disease: Secondary | ICD-10-CM | POA: Diagnosis not present

## 2022-02-09 DIAGNOSIS — I10 Essential (primary) hypertension: Secondary | ICD-10-CM | POA: Diagnosis not present

## 2022-02-13 DIAGNOSIS — G4733 Obstructive sleep apnea (adult) (pediatric): Secondary | ICD-10-CM | POA: Diagnosis not present

## 2022-02-13 DIAGNOSIS — Z419 Encounter for procedure for purposes other than remedying health state, unspecified: Secondary | ICD-10-CM | POA: Diagnosis not present

## 2022-02-20 ENCOUNTER — Other Ambulatory Visit: Payer: Self-pay

## 2022-02-20 NOTE — Patient Instructions (Signed)
Visit Information  [name]  - as a part of your Medicaid benefit, you are eligible for care management and care coordination services at no cost or copay. I was unable to reach you by phone today but would be happy to help you with your health related needs. Please feel free to call me @ [phone]).     Mickel Fuchs, BSW, [place] Managed Medicaid Team  [phone]

## 2022-02-20 NOTE — Patient Outreach (Signed)
Care Coordination  02/20/2022  Maria Hudson 1960/08/23 [id number]   Medicaid Managed Care   Unsuccessful Outreach Note  02/20/2022 Name: Maria Hudson MRN: [id number] DOB: 06-May-1961  Referred by: Nolene Ebbs, MD Reason for referral : High Risk Managed Medicaid (MM Social Work Unsuccessful [employer])   An unsuccessful telephone outreach was attempted today. The patient was referred to the case management team for assistance with care management and care coordination.   Follow Up Plan: The patient has been provided with contact information for the care management team and has been advised to call with any health related questions or concerns.   Mickel Fuchs, BSW, [place] Managed Medicaid Team  [phone]

## 2022-02-23 DIAGNOSIS — E113511 Type 2 diabetes mellitus with proliferative diabetic retinopathy with macular edema, right eye: Secondary | ICD-10-CM | POA: Diagnosis not present

## 2022-02-23 DIAGNOSIS — H35371 Puckering of macula, right eye: Secondary | ICD-10-CM | POA: Diagnosis not present

## 2022-02-23 DIAGNOSIS — H4312 Vitreous hemorrhage, left eye: Secondary | ICD-10-CM | POA: Diagnosis not present

## 2022-02-23 DIAGNOSIS — E113592 Type 2 diabetes mellitus with proliferative diabetic retinopathy without macular edema, left eye: Secondary | ICD-10-CM | POA: Diagnosis not present

## 2022-02-23 IMAGING — US US RENAL
1 series · 14 of 25 positions shown · non-contrast
Comparison: None.

CLINICAL DATA: Hypertension chronic kidney disease

EXAM:
RENAL / URINARY TRACT ULTRASOUND COMPLETE

[Series 1: us renal · 0.23mm/px · 14 of 37 slices shown]
[im 1/37]
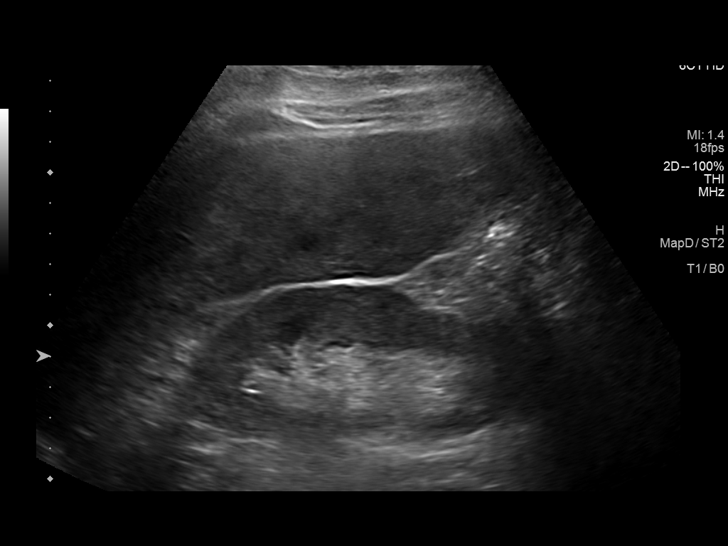
[im 4/37]
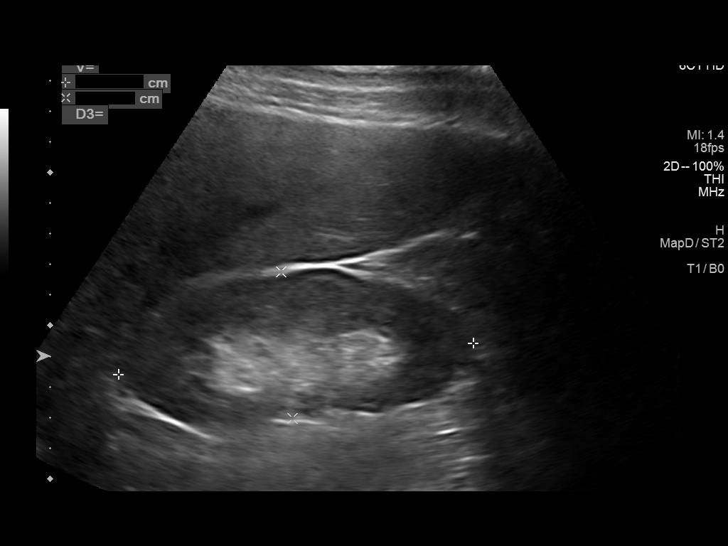
[im 7/37]
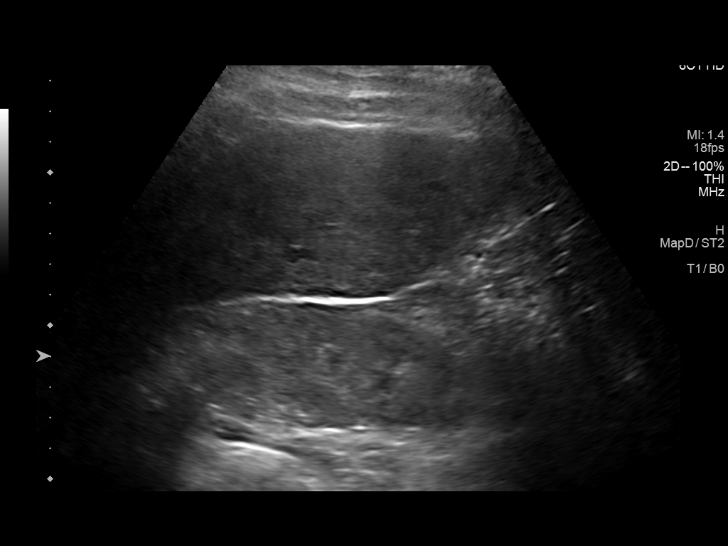
[im 10/37]
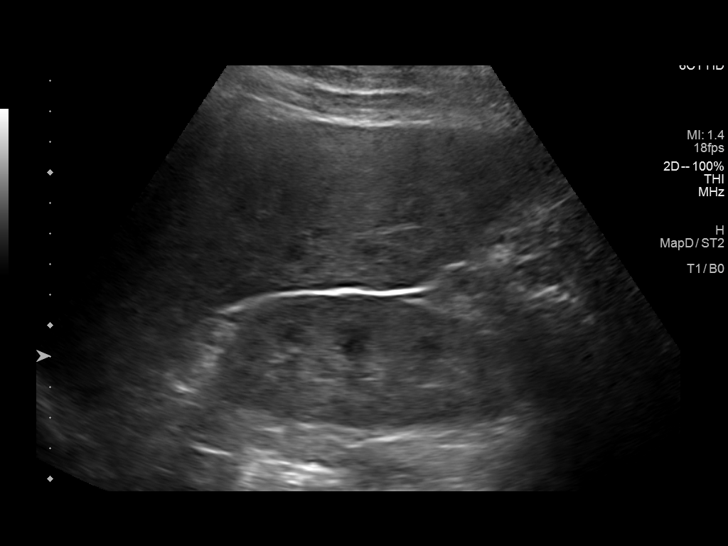
[im 13/37]
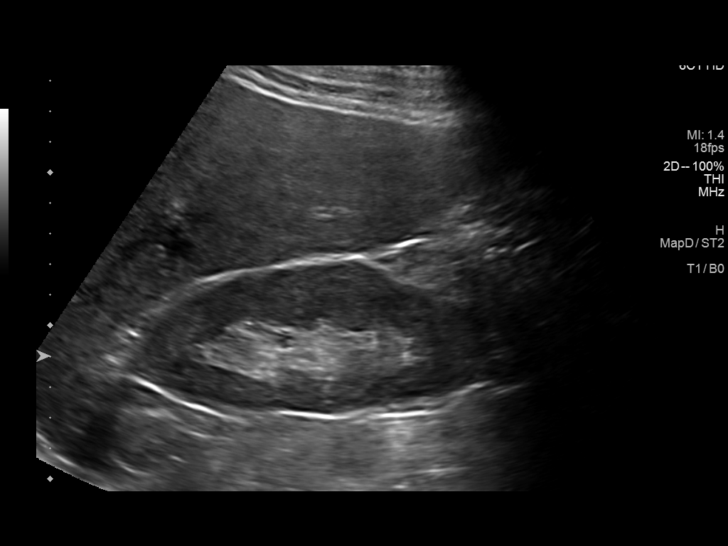
[im 14/37]
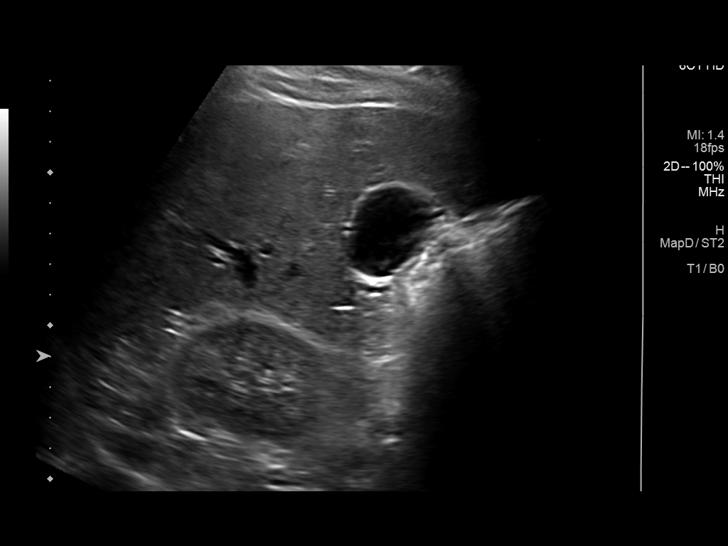
[im 17/37]
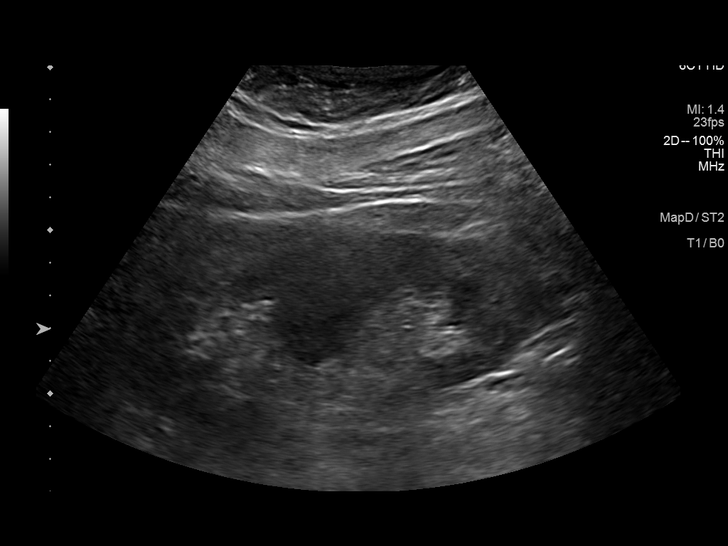
[im 20/37]
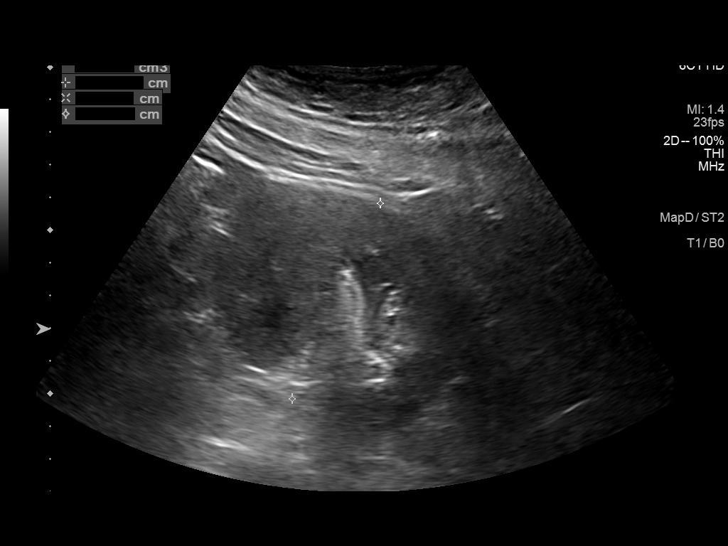
[im 23/37]
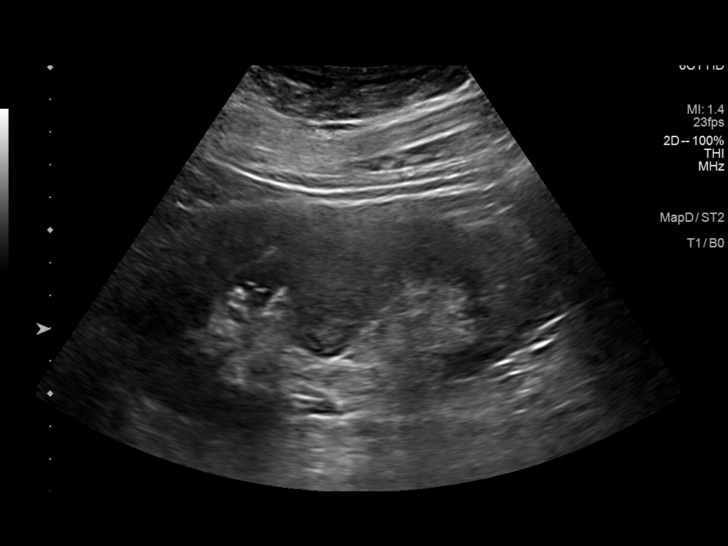
[im 25/37]
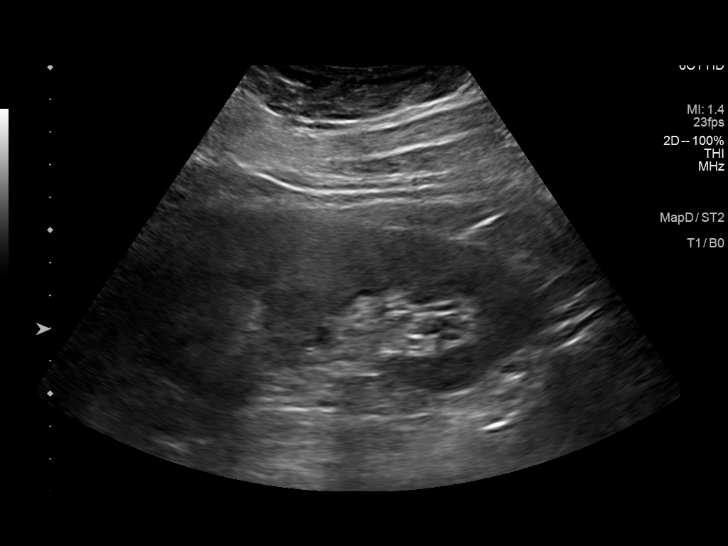
[im 28/37]
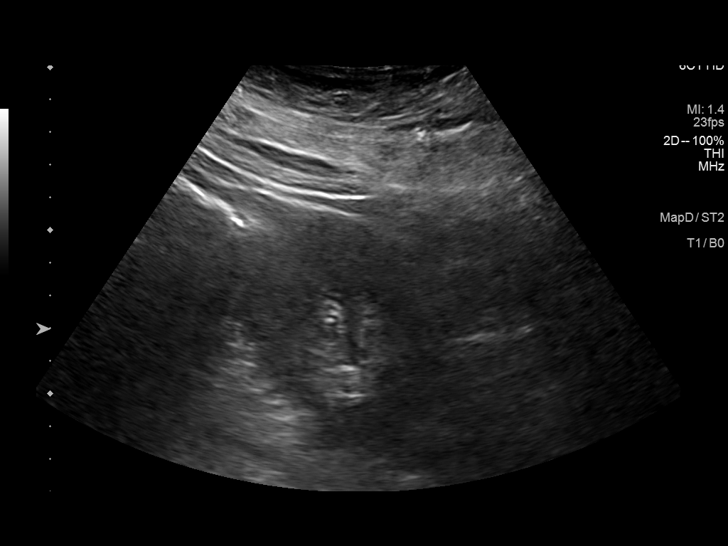
[im 31/37]
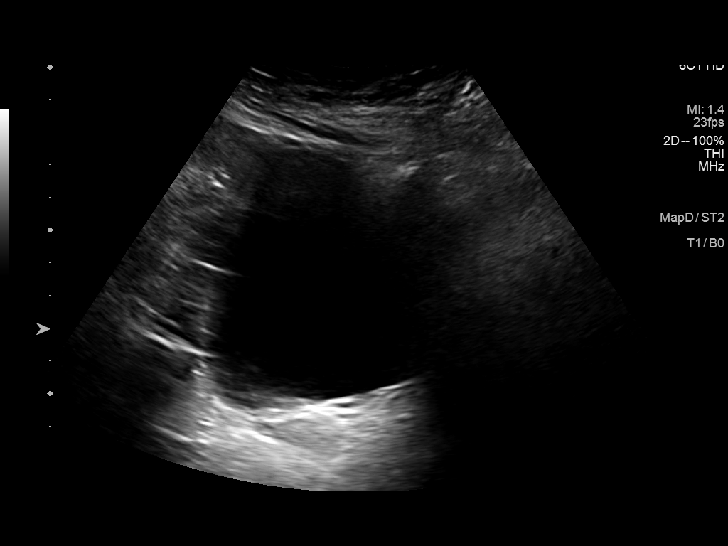
[im 34/37]
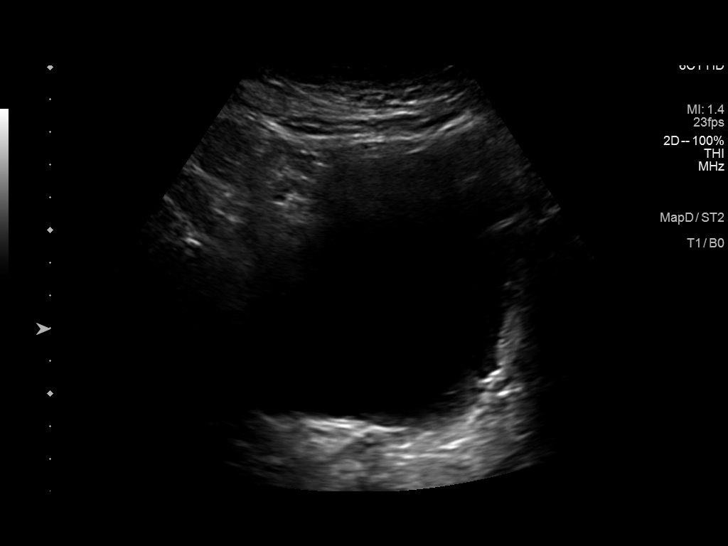
[im 37/37]
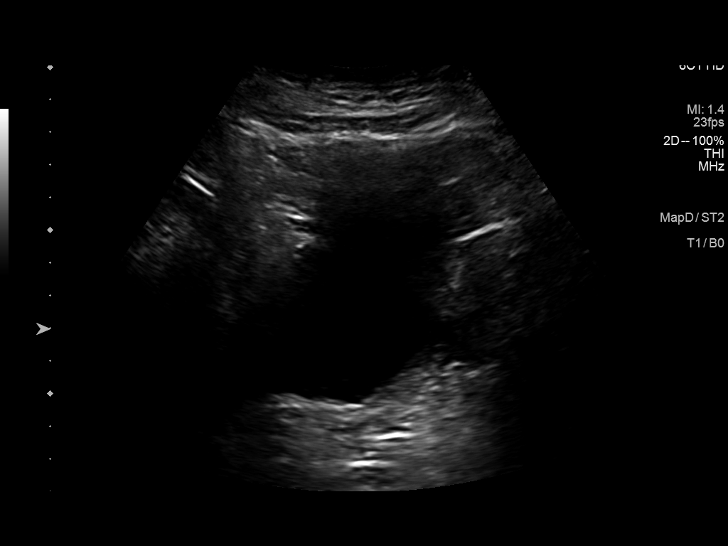

[14 of 25 positions shown; findings below may reference images not displayed]

FINDINGS: Right Kidney:

Renal measurements: 11.6 x 4.8 x 5.9 cm = volume: 170 mL.
Echogenicity within normal limits. No mass or hydronephrosis
visualized.

Left Kidney:

Renal measurements: 12.1 x 6.5 x 6.6 cm = volume: 269 mL.
Echogenicity within normal limits. No mass or hydronephrosis
visualized.

Bladder:

Appears normal for degree of bladder distention.

Other:

None.
IMPRESSION: Negative renal ultrasound

## 2022-02-25 ENCOUNTER — Other Ambulatory Visit: Payer: Self-pay | Admitting: Obstetrics and Gynecology

## 2022-02-25 DIAGNOSIS — Z0000 Encounter for general adult medical examination without abnormal findings: Secondary | ICD-10-CM | POA: Diagnosis not present

## 2022-02-25 DIAGNOSIS — E1122 Type 2 diabetes mellitus with diabetic chronic kidney disease: Secondary | ICD-10-CM | POA: Diagnosis not present

## 2022-02-25 DIAGNOSIS — Z23 Encounter for immunization: Secondary | ICD-10-CM | POA: Diagnosis not present

## 2022-02-25 DIAGNOSIS — M10062 Idiopathic gout, left knee: Secondary | ICD-10-CM | POA: Diagnosis not present

## 2022-02-25 DIAGNOSIS — I10 Essential (primary) hypertension: Secondary | ICD-10-CM | POA: Diagnosis not present

## 2022-02-25 NOTE — Patient Outreach (Signed)
Medicaid Managed Care   Nurse Care Manager Note  02/25/2022 Name:  Maria Hudson MRN:  [id number] DOB:  06-Sep-1960  Maria Hudson is an 61 y.o. year old female who is a primary patient of Nolene Ebbs, MD.  The Medicaid Managed Care Coordination team was consulted for assistance with:    Chronic healthcare management needs, HTN, CKD, DM, CAD, OSA, chronic pain, depression, neuropathy, vision loss, CHF, retinopathy  Maria Hudson was given information about Medicaid Managed Care Coordination team services today. Maria Hudson Patient agreed to services and verbal consent obtained.  [name] for follow up visit in response to provider referral for case management and/or care coordination services.   Assessments/Interventions:  Review of past medical history, allergies, medications, health status, including review of consultants reports, laboratory and other test data, was performed as part of comprehensive evaluation and provision of chronic care management services.  SDOH (Social Determinants of Health) assessments and interventions performed: SDOH Interventions    Flowsheet Row Patient Outreach Telephone from 02/25/2022 in [place] Patient Outreach Telephone from 01/19/2022 in [place] Patient Outreach Telephone from 12/11/2021 in [place] Patient Outreach Telephone from 11/12/2021 in [place] Patient Outreach Telephone from 06/03/2021 in [place] Patient Outreach Telephone from 05/16/2021 in [place] Interventions        Food Insecurity Interventions -- -- -- Intervention Not Indicated -- --  Housing Interventions -- -- Intervention Not Indicated -- -- --  Transportation Interventions -- Intervention Not  Indicated -- -- -- --  Utilities Interventions Intervention Not Indicated -- -- -- -- --  Depression Interventions/Treatment  -- -- -- -- Currently on Treatment --  Financial Strain Interventions -- -- Intervention Not Indicated -- -- --  Stress Interventions -- Intervention Not Indicated -- -- -- Provide Counseling, Offered [employer]  Social Connections Interventions Intervention Not Indicated -- -- -- -- --     Care Plan  Allergies  Allergen Reactions   Metformin And Related Nausea Only   Other     Clonidine patch causes skin irritation    Hydrocodone Itching    Patient able to tolerate when taken with Benadryl.   Oxycodone-Acetaminophen Itching    Patient able to tolerate when taken with Benadryl.    Medications Reviewed Today     Reviewed by Gayla Medicus, RN (Registered Nurse) on 02/25/22 at 62  Med List Status: <None>   Medication Order Taking? Sig Documenting Provider Last Dose Status Informant  Accu-Chek Softclix Lancets lancets [id number]  3 (three) times daily. [provider]  Active Self  acetaminophen (TYLENOL) 500 MG tablet [id number]  Take 1,000 mg by mouth every 6 (six) hours as needed for moderate pain or headache. [provider]  Active Self  ACIDOPHILUS LACTOBACILLUS PO [id number]  Take 1 Capful by mouth daily. [provider]  Active Self  allopurinol (ZYLOPRIM) 100 MG tablet [id number]  Take 100 mg by mouth in the morning. [provider]  Active Self  aspirin EC 81 MG tablet [id number]  Take 81 mg by mouth in the morning. Swallow whole. [provider]  Active Self           Med Note Maria Hudson   Mon Apr 14, 2021  9:22 PM)    atorvastatin (LIPITOR) 40 MG tablet [id number]  Take 1 tablet (40 mg  total) by mouth every evening. Maria Latch, MD  Active Self  buPROPion [facility]) 100 MG tablet [id number]  Take 100 mg by mouth 2 (two) times daily. [provider]  Active Self   carvedilol (COREG) 25 MG tablet [id number]  Take 1 tablet (25 mg total) by mouth 2 (two) times daily with a meal. [name], [name], MD  Active Self  cholecalciferol (VITAMIN D3) 25 MCG (1000 UNIT) tablet [id number]  Take 2,000 Units by mouth daily. [provider]  Active Self  cloNIDine (CATAPRES) 0.3 MG tablet [id number]  Take 0.3 mg by mouth 3 (three) times daily. [provider]  Active Self           Med Note (Britlyn Martine, Shari Prows Jan 19, 2022  9:09 AM) Taking 0.2 mg tid  diclofenac Sodium (VOLTAREN) 1 % GEL [id number]  Apply 2 g topically 4 (four) times daily as needed (chest pain.). [provider]  Active Self  ferrous sulfate 325 (65 FE) MG tablet [id number]  Take 325 mg by mouth 2 (two) times daily. [provider]  Active Self  fluticasone (FLONASE) 50 MCG/ACT nasal spray [id number]  Place 1 spray into both nostrils daily as needed for allergies or rhinitis. [provider]  Active Self  hydrALAZINE (APRESOLINE) 100 MG tablet [id number]  Take 100 mg by mouth 3 (three) times daily. [provider]  Active Self  insulin glargine (LANTUS) 100 unit/mL SOPN [id number]  Inject 10 Units into the skin at bedtime. [provider]  Active Self           Med Note [facility] [place]   Tue Aug 19, 2021  9:37 AM)    isosorbide mononitrate (IMDUR) 60 MG 24 hr tablet [id number]  Take 1.5 tablets (90 mg total) by mouth daily.  Patient taking differently: Take 90 mg by mouth every evening.   Maria Hudson, [place]  Active Self  loratadine (CLARITIN) 10 MG tablet [id number]  Take 10 mg by mouth in the morning. [provider]  Active Self  Multiple Vitamin (MULTIVITAMIN ADULT) TABS [id number]  Take 1 tablet by mouth in the morning. [provider]  Active Self  NIFEdipine (ADALAT CC) 60 MG 24 hr tablet [id number] No Take 2 tablets (120 mg total) by mouth daily.  Patient not taking: Reported on 02/25/2022   Maria Dresser, MD  Not Taking Active Self  nitroGLYCERIN (NITROSTAT) 0.4 MG SL tablet [id number]  Place 1 tablet (0.4 mg total) under the tongue every 5 (five) minutes as needed for chest pain. [name], [name], MD  Active Self           Med Note Gilman Buttner, Maria Hudson   Tue Aug 19, 2021  9:35 AM)    Cira Servant FLEXPEN 100 UNIT/ML FlexPen [id number]  Inject 4 Units into the skin 3 (three) times daily with meals. [provider]  Active Self           Med Note Nicki [place]   Tue Dec 09, 2021  8:58 AM) Patient took 2 units at 0400   Omega-3 Fatty Acids (FISH OIL) 1200 MG CAPS [id number]  Take 1,200 mg by mouth in the morning. [provider]  Active Self  oxyCODONE-acetaminophen (PERCOCET) 5-325 MG tablet [id number]  Take 1 tablet by mouth every 6 (six) hours as needed for severe pain. Baglia, Corrina, PA-C  Active   potassium chloride SA (KLOR-CON M) 20 MEQ tablet [id number]  Take 1 tablet (20 mEq total)  by mouth daily.  Patient taking differently: Take 20 mEq by mouth daily. Takes three times a week   [place], Maricela Bo, [place]  Active Self  predniSONE (DELTASONE) 20 MG tablet [id number]  Take 20 mg by mouth daily as needed (gout). [provider]  Active Self  pregabalin (LYRICA) 25 MG capsule [id number]  Take 25 mg by mouth 2 (two) times daily. [provider]  Active Self  Semaglutide, 1 MG/DOSE, (OZEMPIC, 1 MG/DOSE,) 4 MG/3ML SOPN [id number]  Inject 1 mg into the skin once a week. Buford Dresser, MD  Active   sertraline (ZOLOFT) 50 MG tablet [id number]  Take 50 mg by mouth in the morning. [provider]  Active Self  torsemide (DEMADEX) 20 MG tablet [id number]  Take 2 tablets (40 mg total) by mouth daily for 3 days, THEN 1 tablet (20 mg total) daily.  Patient taking differently: 1 tablet by mouth (20 mg total) daily.   [place], Maricela Bo, [place]  Active Self           Med Note (Keyon Liller, Shari Prows Jan 19, 2022  9:21 AM) Taking 20 mg a day and every other day taking  40 mg  traZODone (DESYREL) 50 MG tablet [id number]  Take 50 mg by mouth at bedtime. [provider]  Active Self           Patient Active Problem List   Diagnosis Date Noted   Pain due to onychomycosis of toenails of both feet 12/05/2021   Pure hypercholesterolemia 05/27/2021   Chronic diastolic CHF (congestive heart failure) ([place]) 04/29/2021   Hypertensive urgency 04/14/2021   Acute on chronic diastolic CHF (congestive heart failure) ([place]) 04/14/2021   Chest pain 03/05/2021   CAD (coronary artery disease) 03/05/2021   T2DM (type 2 diabetes mellitus) ([place]) 03/05/2021   CKD (chronic kidney disease) stage 4, GFR 15-29 ml/min ([place]) 03/05/2021   Class 3 obesity ([place]) 03/05/2021   CVA (cerebral vascular accident) ([place]) 03/05/2021   Acute on chronic diastolic heart failure ([place]) 03/05/2021   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   AKI (acute kidney injury) ([place]) 03/05/2021   Vitreous hemorrhage of left eye ([place]) 11/14/2020   Orthopnea 08/06/2020   Vision loss of right eye 08/06/2020   Excessive daytime sleepiness 01/11/2020   Iron deficiency anemia 12/14/2019   Vitreous hemorrhage, right ([place]) 12/04/2019   Diabetic neuropathy ([place]) 07/26/2019   Proliferative diabetic retinopathy of both 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 ([place]) 02/03/2019   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   Conductive hearing loss, bilateral 03/03/2018   Chronic bilateral low back pain without sciatica 08/21/2015   Chronic kidney disease (CKD) stage G3b/A3, moderately decreased glomerular filtration rate (GFR) between 30-44 mL/min/1.73 square meter and albuminuria creatinine ratio greater than 300 mg/g ([place]) 04/30/2015   Type 2 diabetes mellitus with diabetic polyneuropathy, with long-term current use of insulin ([place]) 02/15/2015   Advance directive on file 05/17/2014   Refusal of blood transfusions as patient is Jehovah's Witness 05/17/2014   OSA treated with BiPAP 02/23/2014    Coronary artery disease 07/07/2013   Severe obesity (BMI >= 40) ([place]) 05/18/2013   Neuropathy 61/95/0932   Diastolic heart failure ([place]) 10/16/2012   Essential hypertension 10/16/2012   Conditions to be addressed/monitored per PCP order:  Chronic healthcare management needs, HTN, CKD, DM, CAD, OSA, chronic pain, depression, neuropathy, vision loss, CHF, retinopathy  Care Plan : [place]  Updates made by Gayla Medicus,  RN since 02/25/2022 12:00 AM     Problem: Knowledge Deficit and Care Coordination Needs Related to Management of HTN, HF, Type 2 DM   Priority: High     Long-Range Goal: Development of Plan of Care to Address Knowledge Deficits and Care Coordination Needs related to  management of CHF, CAD, HTN, HLD, DMII, CKD Stage 4, and Depression   Start Date: 05/06/2021  Expected End Date: 05/06/2022  Priority: High  Note:   Current Barriers:  Knowledge Deficits related to plan of care for management of CHF, CAD, HTN, HLD, DMII, CKD Stage 4, and Depression 02/25/22:  Dental resources received.  Patient still unable to reach anyone for a BH appt and declines at this time stating she doesn't feel she needs it now. BP remains uncontrolled-seen and evaluated by PCP today, EKG WNL.  BP 180-200/75-80 with headache and chest pain.  Medications adjusted  RNCM Clinical Goal(s):  Patient will demonstrate ongoing adherence to prescribed treatment plan for CHF, CAD, HTN, HLD, DMII, CKD Stage 4, and Depression as evidenced by patient report of improved health and quality of life and no unplanned ED or unplanned hospital admissions continue to work with RN Care Manager and/or Social Worker to address care management and care coordination needs related to CHF, CAD, HTN, HLD, DMII, CKD Stage 4, and Depression as evidenced by adherence to CM Team Scheduled appointments      Work with [facility] LCSW and psychiatrist to treat depression  Interventions: Inter-disciplinary care team  collaboration (see longitudinal plan of care) Evaluation of current treatment plan related to  self management and patient's adherence to plan as established by provider Mailed summary of benefits for her [place] Medicaid plan to her home address Collaborated with Care Guide for dental resources-completed. Care Guide referral for dental resources Collaborated with BSW for assistance with [facility] appt.-completed. BSW referral for appt assistance. Collaborated with Pharmacy regarding  uncontrolled HTN Pharmacy referral for uncontrolled HTN,   Heart Failure Interventions:  (Status: Goal on Track (progressing): YES.)  Long Term Goal - patient states her fluid retention has significantly improved,  says she cannot tolerate the compression stockings because they are too tight and make her legs hurt Assessed patient's heart failure management strategies  Diabetes:  (Status: Goal on Track (progressing): YES.) Long Term Goal   Lab Results  Component Value Date   HGBA1C 6.7 (H) 04/29/2021   Hgn A1C=6.3 on 12/26/21 Assessed patient's understanding of A1C goal: <7% Reviewed medications with patient and discussed importance of medication adherence;        Counseled on importance of regular laboratory monitoring as prescribed;        Discussed plans with patient for ongoing care management follow up and provided patient with direct contact information for care management team;      Advised patient, providing education and rationale, to check cbg three times daily and record        call provider for findings outside established parameters;       Review of patient status, including review of consultants reports, relevant laboratory and other test results, and medications completed;       Discussed addition of Ozempic to medication regimen , reviewed mechanism of action and proven cardiovascular benefits  Hypertension and CKD stage 4 : (Status: Goal on Track (progressing): YES.) Long Term  Goal-  has been placed on kidney transplant waiting list, fistu;a placed 12/09/21,ready for use 03/11/22. Last practice recorded BP readings:   BP Readings from Last 3  Encounters:  09/30/21 120/70  09/12/21 (!) 107/58  09/08/21 (!) 144/71     Most recent eGFR/CrCl:    No components found for: CRCL Lab Results  Component Value Date   NA 136 08/19/2021   K 3.9 08/19/2021   CREATININE 2.51 (H) 08/19/2021   EGFR 19 (L) 03/13/2021   GFRNONAA 22 (L) 08/19/2021   GLUCOSE 122 (H) 08/19/2021    Evaluation of current treatment plan related to hypertension self management and patient's adherence to plan as established by provider;   Reviewed medications with patient and discussed importance of compliance;  Discussed plans with patient for ongoing care management follow up and provided patient with direct contact information for care management team; Advised patient, providing education and rationale, to monitor blood pressure daily and record, calling PCP for findings outside established parameters;  Reviewed scheduled/upcoming provider appointments  Patient with labile blood pressures, medication adjusted, patient ot f/u with nephrologist.  Weight Loss Interventions:  (Status:  New goal.) Long Term Goal--patient has started Ozempic Discussed indications, mechanism of action, common side effects and CV benefits  of GLP1 Ozempic Assessed tolerance of Ozempic  Interdisciplinary Collaboration:  (Status: Goal on Track (progressing): YES.) Long Term Goal  Collaborated with BSW to initiate plan of care to address needs related to Housing barriers, Inability to perform ADL's independently, and Inability to perform IADL's independently in patient with CHF, CAD, HTN, HLD, DM, CKD Stage 4, and Depression Assessed status of patient's involvement with behavioral health counselor Collaboration with pharmacist for medication review, discussion of ? Medication side effects causing chronic constipation and  arranging for patient to received home delivery of medications in blister packs Collaboration with Nolene Ebbs, MD regarding development and update of comprehensive plan of care as evidenced by provider attestation and co-signature Inter-disciplinary care team collaboration (see longitudinal plan of care)  Patient Goals/Self-Care Activities: Take medications as prescribed   Attend all scheduled provider appointments Call pharmacy for medication refills 3-7 days in advance of running out of medications Attend church or other social activities Perform all self care activities independently  Call provider office for new concerns or questions  Work with the social worker to address care coordination needs and will continue to work with the clinical team to address health care and disease management related needs call office if I gain more than 2 pounds in one day or 5 pounds in one week keep legs up while sitting track weight in diary use salt in moderation watch for swelling in feet, ankles and legs every day weigh myself daily know when to call the doctorfor fluid weight gain or consistently abnormal BP readings at home track symptoms and what helps feel better or worse check blood sugar at prescribed times: three times daily enter blood sugar readings and medication or insulin into daily log take the blood sugar log to all doctor visits take the blood sugar meter to all doctor visits eat fish at least once per week fill half of plate with vegetables manage portion size check blood pressure daily write blood pressure results in a log or diary learn about high blood pressure take blood pressure log to all doctor appointments call doctor for signs and symptoms of high blood pressure keep all doctor appointments take medications for blood pressure exactly as prescribed Limit fluid intake to 1 liter per day Collaborate with managed Medicaid team of BSW and pharmacist to address  identified needs Work with cardiology pharmacist to assist with BP stabilization Work with [facility] LCSW  and psychiatrist to treat depression Review summary of [employer] benefits mailed to home address and call to enroll in program that will benefit your chronic health issues Continue to use [place] Management calendar to record weight , BP and CBGs so readings are all in one place Schedule dental appt   Long-Range Goal: Establish Plan of Care for Chronic Disease Management Needs   Priority: High  Note:   Timeframe:  Long-Range Goal Priority:  High Start Date:     11/12/21                        Expected End Date:  ongoing                     Follow Up Date: 03/30/22   - schedule appointment for vaccines needed due to my age or health - schedule recommended health tests (blood work, mammogram, colonoscopy, pap test) - schedule and keep appointment for annual check-up    Why is this important?   Screening tests can find diseases early when they are easier to treat.  Your doctor or nurse will talk with you about which tests are important for you.  Getting shots for common diseases like the flu and shingles will help prevent them.  02/25/22:  Patient seen and evaluated by PCP today, EKG-WNL   Follow Up:  Patient agrees to Care Plan and Follow-up.  Plan: The Managed Medicaid care management team will reach out to the patient again over the next 30 business  days. and The  Patient has been provided with contact information for the Managed Medicaid care management team and has been advised to call with any health related questions or concerns.  Date/time of next scheduled RN care management/care coordination outreach: 10/18/ 23 at 230.

## 2022-02-25 NOTE — Patient Instructions (Signed)
Hi Ms. Villari, thanks for speaking with me today, have a wonderful afternoon!!  Ms. Lobue was given information about Medicaid Managed Care team care coordination services as a part of their [facility] Medicaid benefit. Tiarna Koppen verbally consented to engagement with the [facility] Managed Care team.   If you are experiencing a medical emergency, please call 911 or report to your local emergency department or urgent care.   If you have a non-emergency medical problem during routine business hours, please contact your provider's office and ask to speak with a nurse.   For questions related to your [facility] health plan, please call: [phone] or go here:[email or web address]  If you would like to schedule transportation through your [facility] plan, please call the following number at least 2 days in advance of your appointment: [phone].  You can also use the MTM portal or MTM mobile app to manage your rides. For the portal, please go to [email or web address].  Call the [place] at [phone], at any time, 24 hours a day, 7 days a week. If you are in danger or need immediate medical attention call 911.  If you would like help to quit smoking, call 1-800-QUIT-NOW (404) 500-7011) OR Espaol: 1-855-Djelo-Ya (9-741-638-4536) o para ms informacin haga clic aqu or Text READY to 200-400 to register via text  Ms. Sulewski - following are the goals we discussed in your visit today:   Goals Addressed    Timeframe:  Long-Range Goal Priority:  High Start Date:     11/12/21                        Expected End Date:  ongoing                     Follow Up Date: 03/30/22   - schedule appointment for vaccines needed due to my age or health - schedule recommended health tests (blood work, mammogram, colonoscopy, pap test) - schedule and keep appointment for annual check-up    Why is this important?   Screening tests can find diseases early when they are  easier to treat.  Your doctor or nurse will talk with you about which tests are important for you.  Getting shots for common diseases like the flu and shingles will help prevent them.  02/25/22:  Patient seen and evaluated by PCP today, EKG-WNL  Patient verbalizes understanding of instructions and care plan provided today and agrees to view in [place]. Active MyChart status and patient understanding of how to access instructions and care plan via MyChart confirmed with patient.     The Managed Medicaid care management team will reach out to the patient again over the next 30 business  days.  The  Patient  has been provided with contact information for the Managed Medicaid care management team and has been advised to call with any health related questions or concerns.   Aida Raider RN, BSN [place] Management Coordinator - Managed Medicaid High Risk [phone]   Following is a copy of your plan of care:  Care Plan : [place]  Updates made by Gayla Medicus, RN since 02/25/2022 12:00 AM     Problem: Knowledge Deficit and Care Coordination Needs Related to Management of HTN, HF, Type 2 DM   Priority: High     Long-Range Goal: Development of Plan of Care to Address Knowledge Deficits and Care Coordination Needs related to  management of CHF, CAD, HTN, HLD, DMII, CKD Stage 4, and Depression   Start Date: 05/06/2021  Expected End Date: 05/06/2022  Priority: High  Note:   Current Barriers:  Knowledge Deficits related to plan of care for management of CHF, CAD, HTN, HLD, DMII, CKD Stage 4, and Depression 02/25/22:  Dental resources received.  Patient still unable to reach anyone for a BH appt and declines at this time stating she doesn't feel she needs it now. BP remains uncontrolled-seen and evaluated by PCP today, EKG WNL.  BP 180-200/75-80 with headache and chest pain.  Medications adjusted  RNCM Clinical Goal(s):  Patient will  demonstrate ongoing adherence to prescribed treatment plan for CHF, CAD, HTN, HLD, DMII, CKD Stage 4, and Depression as evidenced by patient report of improved health and quality of life and no unplanned ED or unplanned hospital admissions continue to work with RN Care Manager and/or Social Worker to address care management and care coordination needs related to CHF, CAD, HTN, HLD, DMII, CKD Stage 4, and Depression as evidenced by adherence to CM Team Scheduled appointments      Work with [facility] LCSW and psychiatrist to treat depression  Interventions: Inter-disciplinary care team collaboration (see longitudinal plan of care) Evaluation of current treatment plan related to  self management and patient's adherence to plan as established by provider Mailed summary of benefits for her [place] Medicaid plan to her home address Collaborated with Care Guide for dental resources-completed. Care Guide referral for dental resources Collaborated with BSW for assistance with [facility] appt.-completed. BSW referral for appt assistance. Collaborated with Pharmacy regarding  uncontrolled HTN Pharmacy referral for uncontrolled HTN,   Heart Failure Interventions:  (Status: Goal on Track (progressing): YES.)  Long Term Goal - patient states her fluid retention has significantly improved,  says she cannot tolerate the compression stockings because they are too tight and make her legs hurt Assessed patient's heart failure management strategies  Diabetes:  (Status: Goal on Track (progressing): YES.) Long Term Goal   Lab Results  Component Value Date   HGBA1C 6.7 (H) 04/29/2021   Hgn A1C=6.3 on 12/26/21 Assessed patient's understanding of A1C goal: <7% Reviewed medications with patient and discussed importance of medication adherence;        Counseled on importance of regular laboratory monitoring as prescribed;        Discussed plans with patient for ongoing care management follow up and provided  patient with direct contact information for care management team;      Advised patient, providing education and rationale, to check cbg three times daily and record        call provider for findings outside established parameters;       Review of patient status, including review of consultants reports, relevant laboratory and other test results, and medications completed;       Discussed addition of Ozempic to medication regimen , reviewed mechanism of action and proven cardiovascular benefits  Hypertension and CKD stage 4 : (Status: Goal on Track (progressing): YES.) Long Term Goal-  has been placed on kidney transplant waiting list, fistu;a placed 12/09/21,ready for use 03/11/22. Last practice recorded BP readings:   BP Readings from Last 3 Encounters:  09/30/21 120/70  09/12/21 (!) 107/58  09/08/21 (!) 144/71     Most recent eGFR/CrCl:    No components found for: CRCL Lab Results  Component Value Date   NA 136 08/19/2021   K 3.9 08/19/2021   CREATININE 2.51 (H) 08/19/2021  EGFR 19 (L) 03/13/2021   GFRNONAA 22 (L) 08/19/2021   GLUCOSE 122 (H) 08/19/2021    Evaluation of current treatment plan related to hypertension self management and patient's adherence to plan as established by provider;   Reviewed medications with patient and discussed importance of compliance;  Discussed plans with patient for ongoing care management follow up and provided patient with direct contact information for care management team; Advised patient, providing education and rationale, to monitor blood pressure daily and record, calling PCP for findings outside established parameters;  Reviewed scheduled/upcoming provider appointments  Patient with labile blood pressures, medication adjusted, patient ot f/u with nephrologist.  Weight Loss Interventions:  (Status:  New goal.) Long Term Goal--patient has started Ozempic Discussed indications, mechanism of action, common side effects and CV benefits  of GLP1  Ozempic Assessed tolerance of Ozempic  Interdisciplinary Collaboration:  (Status: Goal on Track (progressing): YES.) Long Term Goal  Collaborated with BSW to initiate plan of care to address needs related to Housing barriers, Inability to perform ADL's independently, and Inability to perform IADL's independently in patient with CHF, CAD, HTN, HLD, DM, CKD Stage 4, and Depression Assessed status of patient's involvement with behavioral health counselor Collaboration with pharmacist for medication review, discussion of ? Medication side effects causing chronic constipation and arranging for patient to received home delivery of medications in blister packs Collaboration with Nolene Ebbs, MD regarding development and update of comprehensive plan of care as evidenced by provider attestation and co-signature Inter-disciplinary care team collaboration (see longitudinal plan of care)  Patient Goals/Self-Care Activities: Take medications as prescribed   Attend all scheduled provider appointments Call pharmacy for medication refills 3-7 days in advance of running out of medications Attend church or other social activities Perform all self care activities independently  Call provider office for new concerns or questions  Work with the social worker to address care coordination needs and will continue to work with the clinical team to address health care and disease management related needs call office if I gain more than 2 pounds in one day or 5 pounds in one week keep legs up while sitting track weight in diary use salt in moderation watch for swelling in feet, ankles and legs every day weigh myself daily know when to call the doctorfor fluid weight gain or consistently abnormal BP readings at home track symptoms and what helps feel better or worse check blood sugar at prescribed times: three times daily enter blood sugar readings and medication or insulin into daily log take the blood sugar log  to all doctor visits take the blood sugar meter to all doctor visits eat fish at least once per week fill half of plate with vegetables manage portion size check blood pressure daily write blood pressure results in a log or diary learn about high blood pressure take blood pressure log to all doctor appointments call doctor for signs and symptoms of high blood pressure keep all doctor appointments take medications for blood pressure exactly as prescribed Limit fluid intake to 1 liter per day Collaborate with managed Medicaid team of BSW and pharmacist to address identified needs Work with cardiology pharmacist to assist with BP stabilization Work with [facility] LCSW and psychiatrist to treat depression Review summary of Wellcare benefits mailed to home address and call to enroll in program that will benefit your chronic health issues Continue to use [place] Management calendar to record weight , BP and CBGs so readings are all in one place Schedule  dental appt.

## 2022-02-26 ENCOUNTER — Ambulatory Visit: Payer: Medicaid Other

## 2022-02-26 ENCOUNTER — Telehealth: Payer: Self-pay | Admitting: Pharmacist

## 2022-02-26 ENCOUNTER — Other Ambulatory Visit: Payer: Medicaid Other | Admitting: Pharmacist

## 2022-02-26 NOTE — Progress Notes (Signed)
Contacted patient regarding referral for hypertension from Managed Medicaid nurse case manager.   Left patient a voicemail to return my call at their [place], PharmD, [facility] [phone]

## 2022-03-03 ENCOUNTER — Ambulatory Visit (HOSPITAL_COMMUNITY)
Admission: RE | Admit: 2022-03-03 | Discharge: 2022-03-03 | Disposition: A | Discharge status: Home / Self Care | Payer: Medicaid Other | Source: Ambulatory Visit | Attending: Cardiology | Admitting: Cardiology

## 2022-03-03 ENCOUNTER — Other Ambulatory Visit (HOSPITAL_COMMUNITY): Payer: Self-pay | Admitting: Cardiology

## 2022-03-03 ENCOUNTER — Encounter (HOSPITAL_COMMUNITY): Payer: Self-pay | Admitting: Cardiology

## 2022-03-03 VITALS — BP 100/50 | HR 74 | Wt 213.4 lb

## 2022-03-03 DIAGNOSIS — Z79899 Other long term (current) drug therapy: Secondary | ICD-10-CM | POA: Insufficient documentation

## 2022-03-03 DIAGNOSIS — E1122 Type 2 diabetes mellitus with diabetic chronic kidney disease: Secondary | ICD-10-CM | POA: Diagnosis not present

## 2022-03-03 DIAGNOSIS — R0602 Shortness of breath: Secondary | ICD-10-CM | POA: Diagnosis not present

## 2022-03-03 DIAGNOSIS — Z7982 Long term (current) use of aspirin: Secondary | ICD-10-CM | POA: Diagnosis not present

## 2022-03-03 DIAGNOSIS — Z7901 Long term (current) use of anticoagulants: Secondary | ICD-10-CM | POA: Insufficient documentation

## 2022-03-03 DIAGNOSIS — Z8616 Personal history of COVID-19: Secondary | ICD-10-CM | POA: Insufficient documentation

## 2022-03-03 DIAGNOSIS — I2510 Atherosclerotic heart disease of native coronary artery without angina pectoris: Secondary | ICD-10-CM | POA: Diagnosis not present

## 2022-03-03 DIAGNOSIS — G4733 Obstructive sleep apnea (adult) (pediatric): Secondary | ICD-10-CM | POA: Insufficient documentation

## 2022-03-03 DIAGNOSIS — I5032 Chronic diastolic (congestive) heart failure: Secondary | ICD-10-CM | POA: Diagnosis not present

## 2022-03-03 DIAGNOSIS — N184 Chronic kidney disease, stage 4 (severe): Secondary | ICD-10-CM | POA: Diagnosis not present

## 2022-03-03 DIAGNOSIS — Z6837 Body mass index (BMI) 37.0-37.9, adult: Secondary | ICD-10-CM | POA: Diagnosis not present

## 2022-03-03 DIAGNOSIS — E669 Obesity, unspecified: Secondary | ICD-10-CM | POA: Insufficient documentation

## 2022-03-03 DIAGNOSIS — R079 Chest pain, unspecified: Secondary | ICD-10-CM | POA: Diagnosis not present

## 2022-03-03 DIAGNOSIS — I130 Hypertensive heart and chronic kidney disease with heart failure and stage 1 through stage 4 chronic kidney disease, or unspecified chronic kidney disease: Secondary | ICD-10-CM | POA: Insufficient documentation

## 2022-03-03 DIAGNOSIS — Z794 Long term (current) use of insulin: Secondary | ICD-10-CM | POA: Diagnosis not present

## 2022-03-03 LAB — BASIC METABOLIC PANEL
Anion gap: 11 (ref 5–15)
BUN: 44 mg/dL — ABNORMAL HIGH (ref 6–20)
CO2: 20 mmol/L — ABNORMAL LOW (ref 22–32)
Calcium: 8.7 mg/dL — ABNORMAL LOW (ref 8.9–10.3)
Chloride: 109 mmol/L (ref 98–111)
Creatinine, Ser: 2.3 mg/dL — ABNORMAL HIGH (ref 0.44–1.00)
GFR, Estimated: 24 mL/min — ABNORMAL LOW (ref >60)
Glucose, Bld: 110 mg/dL — ABNORMAL HIGH (ref 70–99)
Potassium: 3.5 mmol/L (ref 3.5–5.1)
Sodium: 140 mmol/L (ref 135–145)

## 2022-03-03 LAB — BRAIN NATRIURETIC PEPTIDE: B Natriuretic Peptide: 142.3 pg/mL — ABNORMAL HIGH (ref 0.0–100.0)

## 2022-03-03 MED ORDER — TORSEMIDE 20 MG PO TABS: 40.0000 mg | ORAL_TABLET | Freq: Every day | ORAL | 4 refills | Status: DC

## 2022-03-03 NOTE — Progress Notes (Signed)
PCP: Nolene Ebbs, MD HF Cardiology: Dr. Aundra Dubin  61 y.o. with history of supine hypertension with orthostatic hypotension, CKD stage 4, CAD, and chronic diastolic CHF was referred from [facility] clinic for evaluation of CHF.    Patient had MI in 05/2013. LHC showed 20% LAD, 50 to 60% left circumflex, and 100% RPDA stenosis with left-to-right collaterals. CAD managed medically.    She reports a long h/o diastolic heart failure and frequent hospitalizations. She moved to [place] from [facility], had been followed by [facility]. Has struggled w/ volume control and readjustment of diuretics over the years. She reports previously being prescribed Wilder Glade but had to stop b/c her insurance would not cover it. Her last echo at [facility] was 7/21. LVEF was 55%, RV normal. RVSP elevated at 60 mmHg. PYP at [facility] was negative for amyloid (grade 1, H/CL 1.21). Serum IFE not suggestive of monoclonal gammopathy. Urine IFE and immunoglobin free light chains were elevated (lg FLCK 9.24, Ig FLCL 3.75, K/L ratio 2.46. Repeat testing recommended 6 months later, but do not see that this was completed).    Referred to HTN clinic 9/22 for uncontrolled HTN. Now on multidrug regimen. Thyroid function, renin, and aldosterone were normal, as were catecholamines and metanephrines. Her PTH was elevated at 134 but calcium was normal. Renal artery doppler study was limited. There was no evidence of right RAS however the left renal artery was not well visualized due to the presence of bowel gas.  Echo was repeated 9/22 showing normal LVEF 55-60%, G1DD and normal RV.    She has struggled w/ fluid management recently, c/b stage IV CKD. Baseline SCr ~2.3.  Followed by nephrology. Previously on Bumex and metolazone. She was admitted 09/73 for a/c diastolic CHF and diuresed w/ IV Lasix. Did not get RHC. Transitioned back to PO diuretics, Torsemide 40 mg once daily + spiro 25 mg daily. D/w Wt 231 lb. Referred to [facility] clinic.   She was admitted again in  11/22 with chest pain, AKI, and orthostatic hypotension.  Torsemide was stopped.  HS-TnI was negative, suspect musculoskeletal chest pain.    PYP study repeated in 12/22 was not suggestive of cardiac amyloidosis.   Follow up 3/23, amlodipine stopped and nifedipine started for supine hypertension with orthostatic hypotension. Stable symptoms and she was not volume overloaded.  She had an AV fistula placed.    Today she returns for HF follow up. She has a chronic chest pain pattern, nonexertional.  This has not changed.  She has been more short of breath for the last few days.  She was short of breath walking into the office today.  Weight up 4 lbs in a week.  BP is now controlled since her BP meds were recently adjusted (She was started on amlodipine last week).  She has mild orthopnea.  No lightheadedness/syncope.   REDs: 39%  Labs (11/22): K 3.8, creatinine 3.6 => 2.62 Labs (2/23): K 3.7, creatinine 3.31  Labs (4/23): K 4.5, creatinine 2.69, LDL 78, HDL 58 Labs (6/23): K 4.2, creatinine 2.65  PMH: 1. HTN: Urine catecholamines and metanephrines normal level, aldosterone normal level.  Complicated by orthostatic hypotension.   - Renal artery dopplers (11/22): No right renal artery stenosis, unable to visualize left renal artery due to overlying bowel gas.  2. Orthostatic hypotension 3. CKD stage 4 4. Type 2 diabetes 5. OSA: Uses CPAP 6. CAD: LHC in 2014 with totally occluded RCA with collaterals, medical management.  7. Chronic diastolic CHF: Echo (5/32) with EF  55%, normal RV, PASP 60 mmHg.  - PYP scan 11/20 at [facility] was negative with grade 1, H/CL 1.21.  - Echo (9/22): EF 55-60%, mild LVH, normal RV, IVC normal.  - RHC (11/22): mean RA 1, PA 41/10 mean 24, mean PCWP 12, CI 3.  - PYP scan (12/22): grade 1, H/CL 1.  8. Gout 9. COVID-19 2/23  Social History   Socioeconomic History   Marital status: Divorced    Spouse name: Not on file   Number of children: 2   Years of education:  Not on file   Highest education level: Some college, no degree  Occupational History   Occupation: retired   Occupation: disabled  Tobacco Use   Smoking status: Never    Passive exposure: Never   Smokeless tobacco: Never  Vaping Use   Vaping Use: Never used  Substance and Sexual Activity   Alcohol use: Never   Drug use: Never   Sexual activity: Not on file  Other Topics Concern   Not on file  Social History Narrative   Not on file   Social Determinants of Health   Financial Resource Strain: Low Risk  (12/11/2021)   Overall Financial Resource Strain (CARDIA)    Difficulty of Paying Living Expenses: Not very hard  Food Insecurity: No Food Insecurity (11/12/2021)   Hunger Vital Sign    Worried About Running Out of Food in the Last Year: Never true    Ran Out of Food in the Last Year: Never true  Transportation Needs: No Transportation Needs (01/19/2022)   PRAPARE - Hydrologist (Medical): No    Lack of Transportation (Non-Medical): No  Physical Activity: Inactive (05/06/2021)   Exercise Vital Sign    Days of Exercise per Week: 0 days    Minutes of Exercise per Session: 0 min  Stress: No Stress Concern Present (01/19/2022)   [place]    Feeling of Stress : Only a little  Social Connections: Moderately Isolated (02/25/2022)   Social Connection and Isolation Panel [NHANES]    Frequency of Communication with Friends and Family: More than three times a week    Frequency of Social Gatherings with Friends and Family: More than three times a week    Attends Religious Services: More than 4 times per year    Active Member of [employer] or Organizations: No    Attends Archivist Meetings: Never    Marital Status: Divorced  Human resources officer Violence: Not At Risk (05/06/2021)   Humiliation, Afraid, Rape, and Kick questionnaire    Fear of Current or Ex-Partner: No    Emotionally Abused: No     Physically Abused: No    Sexually Abused: No   Family History  Problem Relation Age of Onset   Hypertension Mother    Stroke Mother    Diabetes Mother    Other Mother        enlarged heart   Hypertension Father    Hypertension Sister    Diabetes Sister    CAD Sister        enlarged heart   Kidney disease Sister    Diabetes Paternal Grandmother    Hypertension Daughter    Diabetes Daughter    Hypertension Son    Diabetes Son    Breast cancer Paternal Aunt    ROS: All systems reviewed and negative except as per HPI.   Current Outpatient Medications  Medication Sig Dispense Refill  Accu-Chek Softclix Lancets lancets 3 (three) times daily.     acetaminophen (TYLENOL) 500 MG tablet Take 1,000 mg by mouth every 6 (six) hours as needed for moderate pain or headache.     ACIDOPHILUS LACTOBACILLUS PO Take 1 Capful by mouth daily.     allopurinol (ZYLOPRIM) 100 MG tablet Take 100 mg by mouth in the morning.     amLODIPine Besylate (NORVASC PO) Take 1 tablet by mouth daily.     aspirin EC 81 MG tablet Take 81 mg by mouth in the morning. Swallow whole.     atorvastatin (LIPITOR) 40 MG tablet Take 1 tablet (40 mg total) by mouth every evening. 90 tablet 3   buPROPion (WELLBUTRIN) 100 MG tablet Take 100 mg by mouth 2 (two) times daily.     carvedilol (COREG) 25 MG tablet Take 1 tablet (25 mg total) by mouth 2 (two) times daily with a meal. 60 tablet 0   cholecalciferol (VITAMIN D3) 25 MCG (1000 UNIT) tablet Take 2,000 Units by mouth daily.     cloNIDine (CATAPRES) 0.3 MG tablet Take 0.3 mg by mouth 3 (three) times daily.     diclofenac Sodium (VOLTAREN) 1 % GEL Apply 2 g topically 4 (four) times daily as needed (chest pain.).     ferrous sulfate 325 (65 FE) MG tablet Take 325 mg by mouth 2 (two) times daily.     fluticasone (FLONASE) 50 MCG/ACT nasal spray Place 1 spray into both nostrils daily as needed for allergies or rhinitis.     hydrALAZINE (APRESOLINE) 100 MG tablet Take 100 mg  by mouth 3 (three) times daily.     insulin glargine (LANTUS) 100 unit/mL SOPN Inject 10 Units into the skin at bedtime.     isosorbide mononitrate (IMDUR) 60 MG 24 hr tablet Take 1.5 tablets (90 mg total) by mouth daily. 135 tablet 3   loratadine (CLARITIN) 10 MG tablet Take 10 mg by mouth in the morning.     Multiple Vitamin (MULTIVITAMIN ADULT) TABS Take 1 tablet by mouth in the morning.     nitroGLYCERIN (NITROSTAT) 0.4 MG SL tablet Place 1 tablet (0.4 mg total) under the tongue every 5 (five) minutes as needed for chest pain. 20 tablet 0   NOVOLOG FLEXPEN 100 UNIT/ML FlexPen Inject 4 Units into the skin 3 (three) times daily with meals.     Omega-3 Fatty Acids (FISH OIL) 1200 MG CAPS Take 1,200 mg by mouth in the morning.     oxyCODONE-acetaminophen (PERCOCET) 5-325 MG tablet Take 1 tablet by mouth every 6 (six) hours as needed for severe pain. 8 tablet 0   potassium chloride SA (KLOR-CON M) 20 MEQ tablet Take 20 mEq by mouth 3 (three) times a week.     predniSONE (DELTASONE) 20 MG tablet Take 20 mg by mouth daily as needed (gout).     pregabalin (LYRICA) 25 MG capsule Take 25 mg by mouth 2 (two) times daily.     sertraline (ZOLOFT) 50 MG tablet Take 50 mg by mouth in the morning.     traZODone (DESYREL) 50 MG tablet Take 50 mg by mouth at bedtime.     Semaglutide, 1 MG/DOSE, (OZEMPIC, 1 MG/DOSE,) 4 MG/3ML SOPN Inject 1 mg into the skin once a week. 3 mL 2   torsemide (DEMADEX) 20 MG tablet Take 2 tablets (40 mg total) by mouth daily. 90 tablet 3   No current facility-administered medications for this encounter.   Wt Readings from Last 3 Encounters:  03/03/22 96.8 kg (213 lb 6.4 oz)  01/07/22 102.6 kg (226 lb 1.6 oz)  12/09/21 99.8 kg (220 lb)   BP (!) 100/50   Pulse 74   Wt 96.8 kg (213 lb 6.4 oz)   SpO2 98%   BMI 37.21 kg/m  General: NAD Neck: JVP 8-9 cm, no thyromegaly or thyroid nodule.  Lungs: Clear to auscultation bilaterally with normal respiratory effort. CV:  Nondisplaced PMI.  Heart regular S1/S2, no S3/S4, 2/6 early SEM RUSB.  1+ ankle edema.  No carotid bruit.  Normal pedal pulses.  Abdomen: Soft, nontender, no hepatosplenomegaly, no distention.  Skin: Intact without lesions or rashes.  Neurologic: Alert and oriented x 3.  Psych: Normal affect. Extremities: No clubbing or cyanosis.  HEENT: Normal.   Assessment/Plan: 1. HTN: BP appears controlled on current regimen.   - Continue Coreg 25 mg bid, clonidine at current dose, hydralazine 100 mg tid and Imdur 90 mg daily.   - Continue amlodipine 10 mg daily.   2. Chronic diastolic CHF: Echo in 7/56 with EF 55-60%, normal RV. RHC in 11/22 with optimized filling pressures.  PYP scan, myeloma studies were not suggestive of cardiac amyloidosis. She has struggled with volume overload in setting of CKD stage IV.  She is mildly volume overloaded today with NYHA class III symptoms and REDS clip elevated at 39%.   - Increase torsemide to 40 mg daily with BMET/BNP today and BMET in 10 days.  - With Medicaid, unable to get Cardiomems.  3. CKD stage IV: BMET today.  - She is followed by Dr. Royce Macadamia with [facility]. 4. CAD: H/o occluded PDA with collaterals.  No chest pain.  - Continue ASA 81 daily.  - Continue atorvastatin, good lipids 4/23. 5. Obesity: Body mass index is 37.21 kg/m. - She has been on Ozempic in the past, but now awaiting insurance approval to resume. 6. OSA: Continue CPAP.  Follow up in 1 month with APP.  Loralie Champagne 03/03/2022

## 2022-03-03 NOTE — Progress Notes (Signed)
ReDS Vest / Clip - 03/03/22 1000       ReDS Vest / Clip   Station Marker A    Ruler Value 29    ReDS Value Range Moderate volume overload    ReDS Actual Value 39

## 2022-03-03 NOTE — Patient Instructions (Addendum)
Increase your Torsemide to 40mg  daily.  Labs done today, your results will be available in MyChart, we will contact you for abnormal readings.  Repeat blood work in 10 days.  Your physician recommends that you schedule a follow-up appointment in: 1 month  If you have any questions or concerns before your next appointment please send [place] a message through [place] or call our office at [phone].    TO LEAVE A MESSAGE FOR THE NURSE SELECT OPTION 2, PLEASE LEAVE A MESSAGE INCLUDING: YOUR NAME DATE OF BIRTH CALL BACK NUMBER REASON FOR CALL**this is important as we prioritize the call backs  YOU WILL RECEIVE A CALL BACK THE SAME DAY AS LONG AS YOU CALL BEFORE 4:00 PM  At the [facility], you and your health needs are our priority. As part of our continuing mission to provide you with exceptional heart care, we have created designated Provider Care Teams. These Care Teams include your primary Cardiologist (physician) and Advanced Practice Providers (APPs- Physician Assistants and Nurse Practitioners) who all work together to provide you with the care you need, when you need it.   You may see any of the following providers on your designated Care Team at your next follow up: Dr Glori Bickers Dr Loralie Champagne Dr. Roxana Hires, NP Lyda Jester, [place] [facility] [place], [place] Forestine Na, NP Audry Riles, PharmD   Please be sure to bring in all your medications bottles to every appointment.

## 2022-03-06 NOTE — Progress Notes (Signed)
Contacted patient regarding referral for medication management from nurse case management  Appointment scheduled   Catie Hedwig Morton, PharmD, [facility] [phone]

## 2022-03-09 DIAGNOSIS — N184 Chronic kidney disease, stage 4 (severe): Secondary | ICD-10-CM | POA: Diagnosis not present

## 2022-03-11 DIAGNOSIS — K219 Gastro-esophageal reflux disease without esophagitis: Secondary | ICD-10-CM | POA: Diagnosis not present

## 2022-03-11 DIAGNOSIS — I10 Essential (primary) hypertension: Secondary | ICD-10-CM | POA: Diagnosis not present

## 2022-03-11 DIAGNOSIS — E1122 Type 2 diabetes mellitus with diabetic chronic kidney disease: Secondary | ICD-10-CM | POA: Diagnosis not present

## 2022-03-11 DIAGNOSIS — Z23 Encounter for immunization: Secondary | ICD-10-CM | POA: Diagnosis not present

## 2022-03-11 DIAGNOSIS — I509 Heart failure, unspecified: Secondary | ICD-10-CM | POA: Diagnosis not present

## 2022-03-12 ENCOUNTER — Ambulatory Visit (HOSPITAL_COMMUNITY)
Admission: RE | Admit: 2022-03-12 | Discharge: 2022-03-12 | Disposition: A | Discharge status: Home / Self Care | Payer: Medicaid Other | Source: Ambulatory Visit | Attending: Cardiology | Admitting: Cardiology

## 2022-03-12 DIAGNOSIS — I5032 Chronic diastolic (congestive) heart failure: Secondary | ICD-10-CM | POA: Insufficient documentation

## 2022-03-12 LAB — BASIC METABOLIC PANEL
Anion gap: 9 (ref 5–15)
BUN: 61 mg/dL — ABNORMAL HIGH (ref 6–20)
CO2: 20 mmol/L — ABNORMAL LOW (ref 22–32)
Calcium: 8.9 mg/dL (ref 8.9–10.3)
Chloride: 113 mmol/L — ABNORMAL HIGH (ref 98–111)
Creatinine, Ser: 2.25 mg/dL — ABNORMAL HIGH (ref 0.44–1.00)
GFR, Estimated: 24 mL/min — ABNORMAL LOW (ref >60)
Glucose, Bld: 117 mg/dL — ABNORMAL HIGH (ref 70–99)
Potassium: 3.6 mmol/L (ref 3.5–5.1)
Sodium: 142 mmol/L (ref 135–145)

## 2022-03-15 DIAGNOSIS — Z419 Encounter for procedure for purposes other than remedying health state, unspecified: Secondary | ICD-10-CM | POA: Diagnosis not present

## 2022-03-15 DIAGNOSIS — G4733 Obstructive sleep apnea (adult) (pediatric): Secondary | ICD-10-CM | POA: Diagnosis not present

## 2022-03-16 ENCOUNTER — Other Ambulatory Visit (HOSPITAL_COMMUNITY): Payer: Self-pay

## 2022-03-16 ENCOUNTER — Other Ambulatory Visit: Payer: Medicaid Other | Admitting: Pharmacist

## 2022-03-16 NOTE — Patient Instructions (Signed)
Maria Hudson,   It was great talking to you today!  Check your blood pressure twice daily, and any time you have concerning symptoms like headache, chest pain, dizziness, shortness of breath, or vision changes.   Our goal is less than 130/80.  To appropriately check your blood pressure, make sure you do the following:  1) Avoid caffeine, exercise, or tobacco products for 30 minutes before checking. Empty your bladder. 2) Sit with your back supported in a flat-backed chair. Rest your arm on something flat (arm of the chair, table, etc). 3) Sit still with your feet flat on the floor, resting, for at least 5 minutes.  4) Check your blood pressure. Take 1-2 readings.  5) Write down these readings and bring with you to any provider appointments.  Bring your home blood pressure machine with you to a provider's office for accuracy comparison at least once a year.   Make sure you take your blood pressure medications before you come to any office visit, even if you were asked to fast for labs.  Take care!  Catie Hedwig Morton, PharmD, [facility] [phone]

## 2022-03-16 NOTE — Progress Notes (Signed)
Chief Complaint  Patient presents with   Medication Management   Hypertension    Maria Hudson is a 61 y.o. year old female who presented for a telephone visit.   They were referred to the pharmacist by their Case Management Team  for assistance in managing complex medication management.   Patient is participating in a Managed Medicaid Plan:  Yes  Subjective:  Care Team: Primary Care Provider: Nolene Ebbs, MD ; Next Scheduled Visit: unknown Cardiologist: Aundra Dubin for HF; Next Scheduled Visit: 04/01/22 Endocrinologist Canos ([facility]); Next Scheduled Visit: 05/25/22 Nephrology: Royce Macadamia; Next Scheduled Visit: 03/20/22 per patient report  Medication Access/Adherence  Current Pharmacy:  [id number] - [place], [address] [place] [address] Phone: [phone] Fax: [phone]   Patient reports affordability concerns with their medications: No  Patient reports access/transportation concerns to their pharmacy: No  Patient reports adherence concerns with their medications:  Yes  reports [facility] and she has been out for over a month   Diabetes:  Current medications: Ozempic 1 mg weekly, Lantus 10 units, Novolog 3 units with meals Medications tried in the past: renal function limits, metformin   Endocrinology: [facility] at [facility], last A1c at goal  Current glucose readings: fastings: 90s-110s; before lunch: ~100s; before supper: ~100s; bedtime:    Patient reports hypoglycemic s/sx including dizziness, shakiness, sweating - reports she thinks she double dosed on her insulin  Current meal patterns:  - Breakfast: eggs, yogurt - Lunch/supper: lean proteins, not adding salt  Hyperlipidemia/ASCVD Risk Reduction  Current lipid lowering medications: atorvastatin 40 mg daily  Antiplatelet regimen: aspirin 81 mg daily  Heart Failure:  Current medications:  ACEi/ARB/ARNI: limited by renal  function SGLT2i: none Beta blocker: carvedilol 25 mg twice daily Mineralocorticoid Receptor Antagonist: spironolactone 25 mg daily - though just started it this weekend Diuretic regimen: torsemide 40 mg twice daily + potassium 20 mEq daily Additional antihypertensives: clonidine 0.3 mg three times daily, amlodipine 10 mg daily, hydralazine 100 mg three times daily, isosorbide 90 mg daily  Current home blood pressure readings: reports home SBP readings ~200s in the morning. Reports SBP was in 150s at her PCP office the other week, but ~2 weeks ago at HF clinic was borderline low. Patient cannot relate fluctuations to fluid status or dietary changes Current home weights: lost ~ 2 lbs since increasing torsemide dose.  Patient reports volume overload signs or symptoms including shortness of breath, some lower extremity edema, but improved  Noted that [facility] was previously not covered by her insurance. Could have been due to HFpEF, but PA can be completed to argue for coverage, especially given DM and CKD  Depression/Anxiety and Neuropathic Pain:  Current medications: bupropion 100 mg twice daily, sertraline 50 mg daily; also on pregabalin 25 mg twice daily, but reports she was on vacation and lost her bottle and cannot fill until later this week; trazodone 50 mg QPM  Confirmed she takes the second bupropion dose in the afternoon ~ 2 pm  Gout: Current medications: allopurinol 100 mg daily   Health Maintenance  Health Maintenance Due  Topic Date Due   OPHTHALMOLOGY EXAM  Never done   Hepatitis C Screening  Never done   Zoster Vaccines- Shingrix (1 of 2) Never done   PAP SMEAR-Modifier  Never done   MAMMOGRAM  Never done   COVID-19 Vaccine (6 - Mixed Product risk series) 06/28/2021   HEMOGLOBIN A1C  10/27/2021   INFLUENZA VACCINE  01/13/2022     Objective: Lab Results  Component Value Date   HGBA1C 6.7 (H) 04/29/2021    Lab Results  Component Value Date   CREATININE 2.25 (H)  03/12/2022   BUN 61 (H) 03/12/2022   NA 142 03/12/2022   K 3.6 03/12/2022   CL 113 (H) 03/12/2022   CO2 20 (L) 03/12/2022    Lab Results  Component Value Date   CHOL 156 09/25/2021   HDL 58 09/25/2021   LDLCALC 78 09/25/2021   TRIG 109 09/25/2021   CHOLHDL 2.7 09/25/2021    Medications Reviewed Today     Reviewed by Osker Mason, RPH-CPP (Pharmacist) on 03/16/22 at 1104  Med List Status: <None>   Medication Order Taking? Sig Documenting Provider Last Dose Status Informant  Accu-Chek Softclix Lancets lancets [id number] Yes 3 (three) times daily. [provider] Taking Active Self  acetaminophen (TYLENOL) 500 MG tablet [id number] Yes Take 1,000 mg by mouth every 6 (six) hours as needed for moderate pain or headache. [provider] Taking Active Self  ACIDOPHILUS LACTOBACILLUS PO [id number] Yes Take 1 Capful by mouth daily. [provider] Taking Active Self  allopurinol (ZYLOPRIM) 100 MG tablet [id number] Yes Take 100 mg by mouth in the morning. [provider] Taking Active Self  amLODipine (NORVASC) 10 MG tablet [id number] Yes Take 10 mg by mouth daily. [provider] Taking Active   aspirin EC 81 MG tablet [id number] Yes Take 81 mg by mouth in the morning. Swallow whole. [provider] Taking Active Self           Med Note [name]   Mon Apr 14, 2021  9:22 PM)    atorvastatin (LIPITOR) 40 MG tablet [id number] Yes Take 1 tablet (40 mg total) by mouth every evening. Skeet Latch, MD Taking Active Self  buPROPion [facility]) 100 MG tablet [id number] Yes Take 100 mg by mouth 2 (two) times daily. [provider] Taking Active Self  carvedilol (COREG) 25 MG tablet [id number] Yes Take 1 tablet (25 mg total) by mouth 2 (two) times daily with a meal. [name], [name], MD Taking Active Self  cholecalciferol (VITAMIN D3) 25 MCG (1000 UNIT) tablet [id number] Yes Take 2,000 Units by mouth daily. [provider] Taking Active Self  cloNIDine (CATAPRES) 0.3 MG tablet [id number] Yes Take 0.3 mg by mouth 3 (three) times daily. [provider] Taking Active Self           Med Note Stanford [facility]   Tue Mar 03, 2022  9:23 AM)    diclofenac Sodium (VOLTAREN) 1 % GEL [id number] Yes Apply 2 g topically 4 (four) times daily as needed (chest pain.). [provider] Taking Active Self  ferrous sulfate 325 (65 FE) MG tablet [id number] Yes Take 325 mg by mouth 2 (two) times daily. [provider] Taking Active Self  fluticasone (FLONASE) 50 MCG/ACT nasal spray [id number] Yes Place 1 spray into both nostrils daily as needed for allergies or rhinitis. [provider] Taking Active Self  hydrALAZINE (APRESOLINE) 100 MG tablet [id number] Yes Take 100 mg by mouth 3 (three) times daily. [provider] Taking Active Self  insulin glargine (LANTUS) 100 unit/mL SOPN [id number] Yes Inject 10 Units into the skin at bedtime. [provider] Taking Active Self           Med Note Stanford Scotland   Tue Aug 19, 2021  9:37 AM)    isosorbide  mononitrate (IMDUR) 60 MG 24 hr tablet [id number] Yes Take 1.5 tablets (90 mg total) by mouth daily. Allena Katz Stuart, [place] Taking Active Self  LINZESS 72 MCG capsule [id number] Yes Take 72 mcg by mouth every morning. [provider] Taking Active   loratadine (CLARITIN) 10 MG tablet [id number] Yes Take 10 mg by mouth in the morning. [provider] Taking Active Self  Multiple Vitamin (MULTIVITAMIN ADULT) TABS [id number] Yes Take 1 tablet by mouth in the morning. [provider] Taking Active Self  Multiple Vitamins-Minerals (MULTIVITAL PO) [id number] Yes Take by mouth. [provider] Taking Active   nitroGLYCERIN (NITROSTAT) 0.4 MG SL tablet [id number]  Place 1 tablet (0.4 mg total) under the tongue every 5 (five) minutes as needed for chest pain. [name], [name], MD  Active Self            Med Note Gilman Buttner, Juanita Laster   Tue Aug 19, 2021  9:35 AM)    Cira Servant FLEXPEN 100 UNIT/ML FlexPen [id number] Yes Inject 4 Units into the skin 3 (three) times daily with meals. [provider] Taking Active Self           Med Note Jodi Mourning, Toney Reil Mar 16, 2022 11:03 AM) 3 units with meals  Omega-3 Fatty Acids (FISH OIL) 1200 MG CAPS [id number] Yes Take 1,200 mg by mouth in the morning. [provider] Taking Active Self  potassium chloride SA (KLOR-CON M) 20 MEQ tablet [id number] Yes Take 20 mEq by mouth daily. [provider] Taking Active   predniSONE (DELTASONE) 20 MG tablet [id number] No Take 20 mg by mouth daily as needed (gout).  Patient not taking: Reported on 03/16/2022   [provider] Not Taking Active Self  pregabalin (LYRICA) 25 MG capsule [id number] Yes Take 25 mg by mouth 2 (two) times daily. [provider] Taking Active Self  Semaglutide, 1 MG/DOSE, (OZEMPIC, 1 MG/DOSE,) 4 MG/3ML SOPN [id number] Yes Inject 1 mg into the skin once a week. Buford Dresser, MD Taking Active   sertraline (ZOLOFT) 50 MG tablet [id number] Yes Take 50 mg by mouth in the morning. [provider] Taking Active Self  spironolactone (ALDACTONE) 25 MG tablet [id number] Yes Take 25 mg by mouth daily. [provider] Taking Active   torsemide (DEMADEX) 20 MG tablet [id number] Yes Take 2 tablets (40 mg total) by mouth daily. Larey Dresser, MD Taking Active   traZODone (DESYREL) 50 MG tablet [id number] Yes Take 50 mg by mouth at bedtime. [provider] Taking Active Self  TURMERIC CURCUMIN PO [id number] Yes Take by mouth. [provider] Taking Active               Assessment/Plan:   Diabetes: - Currently controlled per last - Reviewed goal A1c, goal fasting, and goal 2 hour post prandial glucose - High risk of hypoglycemia given renal dysfunction. Given low doses of basal and bolus insulin, encouraged patient to  discuss possibility of discontinuation with endocrinologist at next visit.  - [facility] - they do have [place] in stock. Patient amenable to script being transferred. Counseled on risk of GI upset with restarting Ozempic.  - Consider CGM moving forward.  - Recommend to continue current regimen at this time   Hyperlipidemia/ASCVD Risk Reduction: - Currently relatively controlled, last LDL right at goal of 70  - Recommend to continue current regimen at this time  Heart Failure: - Currently with opportunity for optimization - Reviewed appropriate  blood pressure monitoring technique and reviewed goal blood pressure. Encouraged to check BP twice daily, document, and provide to nephrology and cardiology at upcoming visits. May need to adjust administration times of medications if first thing in the morning SBP continue to be significantly elevated - Reviewed to weigh daily and when to contact cardiology with weight gain - Discussed SGLT2. Patient's insurance should cover for HFpEF, T2DM, or CKD, though PA required. Will communicate with HF team - Recommend to continue current regimen at this time  Depression/Anxiety: - Currently controlled per patient report - Recommend to continue current regimen at this time   Gout: - Appropriately managed - Recommended to continue current regimen at this time  Follow Up Plan: phone call in ~ 4 weeks  Catie Hedwig Morton, PharmD, [facility] [phone]

## 2022-03-19 ENCOUNTER — Telehealth: Payer: Self-pay | Admitting: Gastroenterology

## 2022-03-19 NOTE — Telephone Encounter (Signed)
Left message for pt to call back  °

## 2022-03-19 NOTE — Telephone Encounter (Signed)
Pt stated that she has not had a BM since Sunday in which she was really constipated: No Complaints of abdominal pain nor nausea: Pt was notified to try a Miralax Purge ( 7 capfulls of MiraLAX in 32 oz of Gatorade and drink 1 cup every 15 minutes until all gone)  today and call our office tomorrow with a symptom update: Pt verbalized understanding with all questions answered.

## 2022-03-19 NOTE — Telephone Encounter (Signed)
Patient called states she is having going to the bathroom and she would have to force it to come out sometimes to could take about 3 hours for it to come out. Requesting a call as soon possible. Please call to advise. States you can leave a voice message if she doesn't pick up.

## 2022-03-23 ENCOUNTER — Telehealth: Payer: Self-pay

## 2022-03-23 NOTE — Telephone Encounter (Signed)
Pt made aware of Dr. Loletha Carrow recommendations: 20 mg of over-the-counter Dulcolax, and then begin taking a capful of MiraLAX in 8 to 10 ounces of water daily, increasing to twice daily if needed.   Pt was scheduled for an office visit with Alonza Bogus on 03/30/2022 at 1:30 PM Pt made aware: Pt verbalized understanding with all questions answered.

## 2022-03-23 NOTE — Telephone Encounter (Signed)
I suspect this is happening because of her diuretics for congestive heart failure, 1 of which got a dose increase during her recent cardiology appointment.  Recommend she take 20 mg of over-the-counter Dulcolax, and then begin taking a capful of MiraLAX in 8 to 10 ounces of water daily, increasing to twice daily if needed.  She needs a next available clinic appointment with me or an APP.  HD

## 2022-03-23 NOTE — Telephone Encounter (Signed)
Spoke with pt last week ( phone note entered)  Pt was recommended to try a miralax purge for constiption:   Pt stated that she did the Miralax purge on Thursday and started having Multiple loose liquid BM throughout the day on Friday: Pt stated that her last BM was on Friday evening and she is feeling constipated again:  Please advise

## 2022-03-27 ENCOUNTER — Telehealth: Payer: Self-pay | Admitting: Cardiovascular Disease

## 2022-03-27 NOTE — Telephone Encounter (Signed)
What problem are you experiencing? Needing sleep supplies script sent to company  Who is your medical equipment company? [employer]   Please route to the sleep study assistant.

## 2022-03-30 ENCOUNTER — Encounter: Payer: Self-pay | Admitting: Gastroenterology

## 2022-03-30 ENCOUNTER — Ambulatory Visit: Payer: Medicaid Other | Admitting: Gastroenterology

## 2022-03-30 VITALS — BP 110/80 | HR 72 | Ht 63.5 in | Wt 209.0 lb

## 2022-03-30 DIAGNOSIS — K5909 Other constipation: Secondary | ICD-10-CM

## 2022-03-30 NOTE — Telephone Encounter (Signed)
This patient needs compliance visit to be seen by Dr Claiborne Billings. She never had her 90 compliance visit. Her insurance will need this before paying for her supplies. I left her a message to call to schedule. Darryl @ [facility] states he will also call the patient to inform appointment needed.

## 2022-03-30 NOTE — Progress Notes (Unsigned)
03/30/2022 Maria Hudson [id number] March 29, 1961   HISTORY OF PRESENT ILLNESS: ***         Past Medical History:  Diagnosis Date   Acute on chronic diastolic heart failure ([place]) 03/05/2021   Anemia    Arthritis    CAD (coronary artery disease)    CHF (congestive heart failure) (HCC)    CKD (chronic kidney disease) stage 4, GFR 15-29 ml/min (HCC) 03/05/2021   Colon polyps    CVA (cerebral vascular accident) ([place])    Depression    Diabetes mellitus without complication ([place])    type 2   Dyspnea    Hypertension    Hypertensive urgency 04/14/2021   Memory loss    mild   OSA (obstructive sleep apnea) 05/27/2021   uses CPAP 12-18 cm   Pneumonia    several times   Pure hypercholesterolemia 05/27/2021   Renal disorder    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   Stroke [facility])    Past Surgical History:  Procedure Laterality Date   ABDOMINAL HYSTERECTOMY     AV FISTULA PLACEMENT Left 12/09/2021   Procedure: LEFT ARTERIOVENOUS (AV) FISTULA CREATION;  Surgeon: Waynetta Sandy, MD;  Location: [facility];  Service: Vascular;  Laterality: Left;   BIOPSY  09/08/2021   Procedure: BIOPSY;  Surgeon: Doran Stabler, MD;  Location: [facility];  Service: Gastroenterology;;   CESAREAN SECTION     x 1   ESOPHAGOGASTRODUODENOSCOPY (EGD) WITH PROPOFOL N/A 09/08/2021   Procedure: ESOPHAGOGASTRODUODENOSCOPY (EGD) WITH PROPOFOL;  Surgeon: Doran Stabler, MD;  Location: [facility];  Service: Gastroenterology;  Laterality: N/A;  chest pain, epigastric pain   INNER EAR SURGERY Bilateral    RIGHT HEART CATH N/A 05/14/2021   Procedure: RIGHT HEART CATH;  Surgeon: Larey Dresser, MD;  Location: [facility];  Service: Cardiovascular;  Laterality: N/A;   TONSILLECTOMY     age 61    reports that she has never smoked. She has never been exposed to tobacco smoke. She has never used smokeless tobacco. She reports that she does not drink alcohol and does not use drugs. family  history includes Breast cancer in her paternal aunt; CAD in her sister; Diabetes in her daughter, mother, paternal grandmother, sister, and son; Hypertension in her daughter, father, mother, sister, and son; Kidney disease in her sister; Other in her mother; Stroke in her mother. Allergies  Allergen Reactions   Metformin And Related Nausea Only   Other     Clonidine patch causes skin irritation    Hydrocodone Itching    Patient able to tolerate when taken with Benadryl.   Oxycodone-Acetaminophen Itching    Patient able to tolerate when taken with Benadryl.      Outpatient Encounter Medications as of 03/30/2022  Medication Sig   Accu-Chek Softclix Lancets lancets 3 (three) times daily.   acetaminophen (TYLENOL) 500 MG tablet Take 1,000 mg by mouth every 6 (six) hours as needed for moderate pain or headache.   ACIDOPHILUS LACTOBACILLUS PO Take 1 Capful by mouth daily.   allopurinol (ZYLOPRIM) 100 MG tablet Take 100 mg by mouth in the morning.   amLODipine (NORVASC) 10 MG tablet Take 10 mg by mouth daily.   aspirin EC 81 MG tablet Take 81 mg by mouth in the morning. Swallow whole.   atorvastatin (LIPITOR) 40 MG tablet Take 1 tablet (40 mg total) by mouth every evening.   buPROPion (WELLBUTRIN) 100 MG tablet Take 100 mg by mouth 2 (two) times  daily.   carvedilol (COREG) 25 MG tablet Take 1 tablet (25 mg total) by mouth 2 (two) times daily with a meal.   cholecalciferol (VITAMIN D3) 25 MCG (1000 UNIT) tablet Take 2,000 Units by mouth daily.   cloNIDine (CATAPRES) 0.3 MG tablet Take 0.3 mg by mouth 3 (three) times daily.   diclofenac Sodium (VOLTAREN) 1 % GEL Apply 2 g topically 4 (four) times daily as needed (chest pain.).   ferrous sulfate 325 (65 FE) MG tablet Take 325 mg by mouth 2 (two) times daily.   fluticasone (FLONASE) 50 MCG/ACT nasal spray Place 1 spray into both nostrils daily as needed for allergies or rhinitis.   hydrALAZINE (APRESOLINE) 100 MG tablet Take 100 mg by mouth 3  (three) times daily.   insulin glargine (LANTUS) 100 unit/mL SOPN Inject 10 Units into the skin at bedtime.   isosorbide mononitrate (IMDUR) 60 MG 24 hr tablet Take 1.5 tablets (90 mg total) by mouth daily.   LINZESS 72 MCG capsule Take 72 mcg by mouth every morning.   loratadine (CLARITIN) 10 MG tablet Take 10 mg by mouth in the morning.   Multiple Vitamin (MULTIVITAMIN ADULT) TABS Take 1 tablet by mouth in the morning.   Multiple Vitamins-Minerals (MULTIVITAL PO) Take by mouth.   nitroGLYCERIN (NITROSTAT) 0.4 MG SL tablet Place 1 tablet (0.4 mg total) under the tongue every 5 (five) minutes as needed for chest pain.   NOVOLOG FLEXPEN 100 UNIT/ML FlexPen Inject 4 Units into the skin 3 (three) times daily with meals.   Omega-3 Fatty Acids (FISH OIL) 1200 MG CAPS Take 1,200 mg by mouth in the morning.   Polyethylene Glycol 3350 (MIRALAX PO) Take by mouth.   potassium chloride SA (KLOR-CON M) 20 MEQ tablet Take 20 mEq by mouth daily.   predniSONE (DELTASONE) 20 MG tablet Take 20 mg by mouth daily as needed (gout).   pregabalin (LYRICA) 25 MG capsule Take 25 mg by mouth 2 (two) times daily.   Semaglutide, 1 MG/DOSE, (OZEMPIC, 1 MG/DOSE,) 4 MG/3ML SOPN Inject 1 mg into the skin once a week.   sertraline (ZOLOFT) 50 MG tablet Take 50 mg by mouth in the morning.   spironolactone (ALDACTONE) 25 MG tablet Take 25 mg by mouth daily.   torsemide (DEMADEX) 20 MG tablet Take 2 tablets (40 mg total) by mouth daily.   traZODone (DESYREL) 50 MG tablet Take 50 mg by mouth at bedtime.   TURMERIC CURCUMIN PO Take by mouth.   No facility-administered encounter medications on file as of 03/30/2022.     REVIEW OF SYSTEMS  : All other systems reviewed and negative except where noted in the History of Present Illness.   PHYSICAL EXAM: BP 110/80   Pulse 72   Ht 5' 3.5" (1.613 m)   Wt 209 lb (94.8 kg)   BMI 36.44 kg/m  General: Well developed white female in no acute distress Head: Normocephalic and  atraumatic Eyes:  sclerae anicteric,conjunctive pink. Ears: Normal auditory acuity Neck: Supple, no masses.  Lungs: Clear throughout to auscultation Heart: Regular rate and rhythm Abdomen: Soft, nontender, non distended. No masses or hepatomegaly noted. Normal bowel sounds Rectal: *** Musculoskeletal: Symmetrical with no gross deformities  Skin: No lesions on visible extremities Extremities: No edema  Neurological: Alert oriented x 4, grossly nonfocal Cervical Nodes:  No significant cervical adenopathy Psychological:  Alert and cooperative. Normal mood and affect  ASSESSMENT AND PLAN:    CC:  Nolene Ebbs, MD

## 2022-03-30 NOTE — Patient Instructions (Signed)
Linzess 145 mcg daily before breakfast (samples provided).   Complete colonoscopy prep today.   Call or send Mychart message in 10 days with an update.   _______________________________________________________  If you are age 60 or older, your body mass index should be between 23-30. Your Body mass index is 36.44 kg/m. If this is out of the aforementioned range listed, please consider follow up with your Primary Care Provider.  If you are age 1 or younger, your body mass index should be between 19-25. Your Body mass index is 36.44 kg/m. If this is out of the aformentioned range listed, please consider follow up with your Primary Care Provider.   ________________________________________________________  The [facility] providers would like to encourage you to use [facility] to communicate with providers for non-urgent requests or questions.  Due to long hold times on the telephone, sending your provider a message by [facility] may be a faster and more efficient way to get a response.  Please allow 48 business hours for a response.  Please remember that this is for non-urgent requests.  _______________________________________________________

## 2022-03-31 ENCOUNTER — Encounter: Payer: Self-pay | Admitting: Gastroenterology

## 2022-03-31 DIAGNOSIS — G4733 Obstructive sleep apnea (adult) (pediatric): Secondary | ICD-10-CM | POA: Diagnosis not present

## 2022-03-31 DIAGNOSIS — K5909 Other constipation: Secondary | ICD-10-CM | POA: Insufficient documentation

## 2022-03-31 NOTE — Progress Notes (Signed)
____________________________________________________________  Attending physician addendum:  Thank you for sending this case to me. I have reviewed the entire note and agree with the plan.  I would maximize the use of Linzess and MiraLAX prior to scheduling a colonoscopy in this medically complex patient who is at increased risk for sedation.  I still suspect that the diuretics are contributing to hard stools and constipation, and it sounds like there is probably an element of pelvic floor dysfunction as well.  Wilfrid Lund, MD  ____________________________________________________________

## 2022-04-01 ENCOUNTER — Other Ambulatory Visit: Payer: Self-pay | Admitting: Obstetrics and Gynecology

## 2022-04-01 ENCOUNTER — Encounter (HOSPITAL_COMMUNITY): Payer: Medicaid Other

## 2022-04-01 NOTE — Patient Outreach (Signed)
Medicaid Managed Care   Nurse Care Manager Note  04/01/2022 Name:  Maria Hudson MRN:  [id number] DOB:  10/09/1960  Maria Hudson is an 61 y.o. year old female who is a primary patient of Nolene Ebbs, MD.  The Medicaid Managed Care Coordination team was consulted for assistance with:    Chronic healthcare management needs, CHF, retinopathy, HTN, CKD, DM, CAD, OSA, chronic pain, neuropathy, h/o depression, vision loss  Maria Hudson was given information about Medicaid Managed Care Coordination team services today. Maria Hudson Patient agreed to services and verbal consent obtained.  [name] for follow up visit in response to provider referral for case management and/or care coordination services.   Assessments/Interventions:  Review of past medical history, allergies, medications, health status, including review of consultants reports, laboratory and other test data, was performed as part of comprehensive evaluation and provision of chronic care management services.  SDOH (Social Determinants of Health) assessments and interventions performed: SDOH Interventions    Flowsheet Row Patient Outreach Telephone from 04/01/2022 in [place] Patient Outreach Telephone from 02/25/2022 in [place] Patient Outreach Telephone from 01/19/2022 in [place] Patient Outreach Telephone from 12/11/2021 in [place] Patient Outreach Telephone from 11/12/2021 in [place] Patient Outreach Telephone from 06/03/2021 in [place] Coordination  SDOH Interventions        Food Insecurity Interventions -- -- -- -- Intervention Not Indicated --  Housing Interventions -- -- -- Intervention Not Indicated -- --  Transportation Interventions -- -- Intervention  Not Indicated -- -- --  Utilities Interventions -- Intervention Not Indicated -- -- -- --  Depression Interventions/Treatment  -- -- -- -- -- Currently on Treatment  Financial Strain Interventions -- -- -- Intervention Not Indicated -- --  Physical Activity Interventions Other (Comments)  [patient not physically able to] -- -- -- -- --  Stress Interventions -- -- Intervention Not Indicated -- -- --  Social Connections Interventions -- Intervention Not Indicated -- -- -- --       Care Plan  Allergies  Allergen Reactions   Metformin And Related Nausea Only   Other     Clonidine patch causes skin irritation    Hydrocodone Itching    Patient able to tolerate when taken with Benadryl.   Oxycodone-Acetaminophen Itching    Patient able to tolerate when taken with Benadryl.    Medications Reviewed Today     Reviewed by Gayla Medicus, RN (Registered Nurse) on 04/01/22 at 1455  Med List Status: <None>   Medication Order Taking? Sig Documenting Provider Last Dose Status Informant  Accu-Chek Softclix Lancets lancets [id number]  3 (three) times daily. [provider]  Active Self  acetaminophen (TYLENOL) 500 MG tablet [id number]  Take 1,000 mg by mouth every 6 (six) hours as needed for moderate pain or headache. [provider]  Active Self  ACIDOPHILUS LACTOBACILLUS PO [id number]  Take 1 Capful by mouth daily. [provider]  Active Self  allopurinol (ZYLOPRIM) 100 MG tablet [id number]  Take 100 mg by mouth in the morning. [provider]  Active Self  amLODipine (NORVASC) 10 MG tablet [id number]  Take 10 mg by mouth daily. [provider]  Active   aspirin EC 81 MG tablet [id number]  Take 81 mg by mouth in the morning. Swallow whole. [provider]  Active Self  Med Note [name]   Mon Apr 14, 2021  9:22 PM)    atorvastatin (LIPITOR) 40 MG tablet [id number]  Take 1 tablet (40 mg total) by mouth every evening. Maria Latch, MD  Active Self  buPROPion [facility]) 100 MG tablet [id number]  Take 100 mg by mouth 2 (two) times daily. [provider]  Active Self  carvedilol (COREG) 25 MG tablet [id number]  Take 1 tablet (25 mg total) by mouth 2 (two) times daily with a meal. [name], Maria Picket, MD  Active Self  cholecalciferol (VITAMIN D3) 25 MCG (1000 UNIT) tablet [id number]  Take 2,000 Units by mouth daily. [provider]  Active Self  cloNIDine (CATAPRES) 0.3 MG tablet [id number]  Take 0.3 mg by mouth 3 (three) times daily. [provider]  Active Self           Med Note Stanford Scotland   Tue Mar 03, 2022  9:23 AM)    diclofenac Sodium (VOLTAREN) 1 % GEL [id number]  Apply 2 g topically 4 (four) times daily as needed (chest pain.). [provider]  Active Self  ferrous sulfate 325 (65 FE) MG tablet [id number]  Take 325 mg by mouth 2 (two) times daily. [provider]  Active Self  fluticasone (FLONASE) 50 MCG/ACT nasal spray [id number]  Place 1 spray into both nostrils daily as needed for allergies or rhinitis. [provider]  Active Self  furosemide (LASIX) 20 MG tablet [id number] Yes Take 20 mg by mouth daily. Takes two every other day [provider]  Active Self  hydrALAZINE (APRESOLINE) 100 MG tablet [id number]  Take 100 mg by mouth 3 (three) times daily. [provider]  Active Self  insulin glargine (LANTUS) 100 unit/mL SOPN [id number]  Inject 10 Units into the skin at bedtime. [provider]  Active Self           Med Note Stanford Scotland   Tue Aug 19, 2021  9:37 AM)    isosorbide mononitrate (IMDUR) 60 MG 24 hr tablet [id number]  Take 1.5 tablets (90 mg total) by mouth daily. Maria Hudson, [place]  Active Self  LINZESS 72 MCG capsule [id number]  Take 72 mcg by mouth every morning. [provider]  Active   loratadine (CLARITIN) 10 MG tablet [id number]  Take 10 mg by mouth in the morning. [provider]  Active Self  Multiple Vitamin (MULTIVITAMIN ADULT) TABS [id number]  Take 1 tablet by mouth in the morning. [provider]  Active Self  Multiple Vitamins-Minerals (MULTIVITAL PO) [id number]  Take by mouth. [provider]  Active   nitroGLYCERIN (NITROSTAT) 0.4 MG SL tablet [id number]  Place 1 tablet (0.4 mg total) under the tongue every 5 (five) minutes as needed for chest pain. [name], Maria Picket, MD  Active Self           Med Note Gilman Buttner, Juanita Laster   Tue Aug 19, 2021  9:35 AM)    Cira Servant FLEXPEN 100 UNIT/ML FlexPen [id number]  Inject 4 Units into the skin 3 (three) times daily with meals. [provider]  Active Self           Med Note Jodi Mourning, Toney Reil Mar 16, 2022 11:03 AM) 3 units with meals  Omega-3 Fatty Acids (FISH OIL) 1200 MG CAPS [id number]  Take 1,200 mg by mouth in the morning. [provider]  Active Self  Polyethylene Glycol 3350 (MIRALAX PO) [id number]  Take by mouth. [provider]  Active   potassium chloride SA (KLOR-CON M) 20 MEQ tablet [id number]  Take 20 mEq by mouth daily. [provider]  Active   predniSONE (DELTASONE) 20 MG tablet [id number]  Take 20 mg by mouth daily as needed (gout). [provider]  Active Self  pregabalin (LYRICA) 25 MG capsule [id number]  Take 25 mg by mouth 2 (two) times daily. [provider]  Active Self  Semaglutide, 1 MG/DOSE, (OZEMPIC, 1 MG/DOSE,) 4 MG/3ML SOPN [id number]  Inject 1 mg into the skin once a week. Buford Dresser, MD  Active   sertraline (ZOLOFT) 50 MG tablet [id number]  Take 50 mg by mouth in the morning. [provider]  Active Self  spironolactone (ALDACTONE) 25 MG tablet [id number]  Take 25 mg by mouth daily. [provider]  Active   torsemide (DEMADEX) 20 MG tablet [id number]  Take 2 tablets (40 mg total) by mouth daily. Larey Dresser, MD  Active   traZODone (DESYREL) 50 MG tablet [id number]  Take 50 mg by mouth at  bedtime. [provider]  Active Self  TURMERIC CURCUMIN PO [id number]  Take by mouth. [provider]  Active             Patient Active Problem List   Diagnosis Date Noted   Chronic constipation 03/31/2022   Pain due to onychomycosis of toenails of both feet 12/05/2021   Pure hypercholesterolemia 05/27/2021   Chronic diastolic CHF (congestive heart failure) ([place]) 04/29/2021   Hypertensive urgency 04/14/2021   Acute on chronic diastolic CHF (congestive heart failure) ([place]) 04/14/2021   Chest pain 03/05/2021   CAD (coronary artery disease) 03/05/2021   T2DM (type 2 diabetes mellitus) ([place]) 03/05/2021   CKD (chronic kidney disease) stage 4, GFR 15-29 ml/min ([place]) 03/05/2021   Class 3 obesity ([place]) 03/05/2021   CVA (cerebral vascular accident) ([place]) 03/05/2021   Acute on chronic diastolic heart failure ([place]) 03/05/2021   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   AKI (acute kidney injury) ([place]) 03/05/2021   Vitreous hemorrhage of left eye ([place]) 11/14/2020   Orthopnea 08/06/2020   Vision loss of right eye 08/06/2020   Excessive daytime sleepiness 01/11/2020   Iron deficiency anemia 12/14/2019   Vitreous hemorrhage, right ([place]) 12/04/2019   Diabetic neuropathy ([place]) 07/26/2019   Proliferative diabetic retinopathy of both 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 ([place]) 02/03/2019   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   Conductive hearing loss, bilateral 03/03/2018   Chronic bilateral low back pain without sciatica 08/21/2015   Chronic kidney disease (CKD) stage G3b/A3, moderately decreased glomerular filtration rate (GFR) between 30-44 mL/min/1.73 square meter and albuminuria creatinine ratio greater than 300 mg/g ([place]) 04/30/2015   Type 2 diabetes mellitus with diabetic polyneuropathy, with long-term current use of insulin ([place]) 02/15/2015   Advance directive on file 05/17/2014   Refusal of blood transfusions as patient is Jehovah's Witness  05/17/2014   OSA treated with BiPAP 02/23/2014   Coronary artery disease 07/07/2013   Severe obesity (BMI >= 40) ([place]) 05/18/2013   Neuropathy 70/06/7492   Diastolic heart failure ([place]) 10/16/2012   Essential hypertension 10/16/2012   Conditions to be addressed/monitored per PCP order:  Chronic healthcare management needs, CHF, retinopathy, HTN, CKD, DM, CAD, OSA, chronic pain, neuropathy, h/o depression, vision loss  Care Plan : RN Care Manager Plan Of Care  Updates made by Gayla Medicus, RN since 04/01/2022 12:00 AM     Problem: Knowledge Deficit and Care Coordination Needs Related  to Management of HTN, HF, Type 2 DM   Priority: High     Long-Range Goal: Development of Plan of Care to Address Knowledge Deficits and Care Coordination Needs related to  management of CHF, CAD, HTN, HLD, DMII, CKD Stage 4, and Depression   Start Date: 05/06/2021  Expected End Date: 05/06/2022  Priority: High  Note:   Current Barriers:  Knowledge Deficits related to plan of care for management of CHF, CAD, HTN, HLD, DMII, CKD Stage 4, and Depression 04/01/22:  BP improving, 145/87-some dizziness remains with walking but improving.  Blood sugars stable and pain improved.    RNCM Clinical Goal(s):  Patient will demonstrate ongoing adherence to prescribed treatment plan for CHF, CAD, HTN, HLD, DMII, CKD Stage 4, and Depression as evidenced by patient report of improved health and quality of life and no unplanned ED or unplanned hospital admissions continue to work with RN Care Manager and/or Social Worker to address care management and care coordination needs related to CHF, CAD, HTN, HLD, DMII, CKD Stage 4, and Depression as evidenced by adherence to CM Team Scheduled appointments      Work with [facility] LCSW and psychiatrist to treat depression-patient states she does not feel she needs right now.  Interventions: Inter-disciplinary care team collaboration (see longitudinal plan of care) Evaluation of  current treatment plan related to  self management and patient's adherence to plan as established by provider Mailed summary of benefits for her [place] Medicaid plan to her home address Collaborated with Care Guide for dental resources-completed. Care Guide referral for dental resources Collaborated with BSW for assistance with [facility] [place] appt.-completed. BSW referral for appt assistance. Collaborated with Pharmacy regarding  uncontrolled HTN Pharmacy referral for uncontrolled HTN-completed.   Heart Failure Interventions:  (Status: Goal on Track (progressing): YES.)  Long Term Goal - patient states her fluid retention has significantly improved,  says she cannot tolerate the compression stockings because they are too tight and make her legs hurt Assessed patient's heart failure management strategies  Diabetes:  (Status: Goal on Track (progressing): YES.) Long Term Goal   Lab Results  Component Value Date   HGBA1C 6.7 (H) 04/29/2021   Hgn A1C=6.3 on 12/26/21 Assessed patient's understanding of A1C goal: <7% Reviewed medications with patient and discussed importance of medication adherence;        Counseled on importance of regular laboratory monitoring as prescribed;        Discussed plans with patient for ongoing care management follow up and provided patient with direct contact information for care management team;      Advised patient, providing education and rationale, to check cbg three times daily and record        call provider for findings outside established parameters;       Review of patient status, including review of consultants reports, relevant laboratory and other test results, and medications completed;       Discussed addition of Ozempic to medication regimen , reviewed mechanism of action and proven cardiovascular benefits  Hypertension and CKD stage 4 : (Status: Goal on Track (progressing): YES.) Long Term Goal-  has been placed on kidney transplant waiting  list, fistu;a placed 12/09/21,ready for use 03/11/22. Last practice recorded BP readings:   BP 04/01/22-145/87 BP Readings from Last 3 Encounters:  09/30/21 120/70  09/12/21 (!) 107/58  09/08/21 (!) 144/71     Most recent eGFR/CrCl:    No components found for: CRCL Lab Results  Component Value Date   NA  136 08/19/2021   K 3.9 08/19/2021   CREATININE 2.51 (H) 08/19/2021   EGFR 19 (L) 03/13/2021   GFRNONAA 22 (L) 08/19/2021   GLUCOSE 122 (H) 08/19/2021    Evaluation of current treatment plan related to hypertension self management and patient's adherence to plan as established by provider;   Reviewed medications with patient and discussed importance of compliance;  Discussed plans with patient for ongoing care management follow up and provided patient with direct contact information for care management team; Advised patient, providing education and rationale, to monitor blood pressure daily and record, calling PCP for findings outside established parameters;  Reviewed scheduled/upcoming provider appointments  Patient with labile blood pressures, medication adjusted, patient ot f/u with nephrologist.  Weight Loss Interventions:  (Status:  New goal.) Long Term Goal--patient has started Ozempic Discussed indications, mechanism of action, common side effects and CV benefits  of GLP1 Ozempic Assessed tolerance of Ozempic  Interdisciplinary Collaboration:  (Status: Goal on Track (progressing): YES.) Long Term Goal  Collaborated with BSW to initiate plan of care to address needs related to Housing barriers, Inability to perform ADL's independently, and Inability to perform IADL's independently in patient with CHF, CAD, HTN, HLD, DM, CKD Stage 4, and Depression Assessed status of patient's involvement with behavioral health counselor Collaboration with pharmacist for medication review, discussion of ? Medication side effects causing chronic constipation and arranging for patient to received home  delivery of medications in blister packs Collaboration with Nolene Ebbs, MD regarding development and update of comprehensive plan of care as evidenced by provider attestation and co-signature Inter-disciplinary care team collaboration (see longitudinal plan of care)  Patient Goals/Self-Care Activities: Take medications as prescribed   Attend all scheduled provider appointments Call pharmacy for medication refills 3-7 days in advance of running out of medications Attend church or other social activities Perform all self care activities independently  Call provider office for new concerns or questions  Work with the social worker to address care coordination needs and will continue to work with the clinical team to address health care and disease management related needs call office if I gain more than 2 pounds in one day or 5 pounds in one week keep legs up while sitting track weight in diary use salt in moderation watch for swelling in feet, ankles and legs every day weigh myself daily know when to call the doctorfor fluid weight gain or consistently abnormal BP readings at home track symptoms and what helps feel better or worse check blood sugar at prescribed times: three times daily enter blood sugar readings and medication or insulin into daily log take the blood sugar log to all doctor visits take the blood sugar meter to all doctor visits eat fish at least once per week fill half of plate with vegetables manage portion size check blood pressure daily write blood pressure results in a log or diary learn about high blood pressure take blood pressure log to all doctor appointments call doctor for signs and symptoms of high blood pressure keep all doctor appointments take medications for blood pressure exactly as prescribed Limit fluid intake to 1 liter per day Collaborate with managed Medicaid team of BSW and pharmacist to address identified needs Work with cardiology  pharmacist to assist with BP stabilization Work with [facility] LCSW and psychiatrist to treat depression Review summary of [employer] benefits mailed to home address and call to enroll in program that will benefit your chronic health issues Continue to use [place] Management calendar  to record weight , BP and CBGs so readings are all in one place Schedule dental appt   Long-Range Goal: Establish Plan of Care for Chronic Disease Management Needs   Priority: High  Note:   Timeframe:  Long-Range Goal Priority:  High Start Date:     11/12/21                        Expected End Date:  ongoing                     Follow Up Date: 05/01/22   - schedule appointment for vaccines needed due to my age or health - schedule recommended health tests (blood work, mammogram, colonoscopy, pap test) - schedule and keep appointment for annual check-up    Why is this important?   Screening tests can find diseases early when they are easier to treat.  Your doctor or nurse will talk with you about which tests are important for you.  Getting shots for common diseases like the flu and shingles will help prevent them.  03/31/22:  recently seen and evaluated by GI, has upcoming appts. With eye provider and CARDS   Follow Up:  Patient agrees to Care Plan and Follow-up.  Plan: The Managed Medicaid care management team will reach out to the patient again over the next 30 business  days. and The  Patient has been provided with contact information for the Managed Medicaid care management team and has been advised to call with any health related questions or concerns.  Date/time of next scheduled RN care management/care coordination outreach:  05/01/22 at 1230.

## 2022-04-01 NOTE — Patient Instructions (Signed)
Hi Ms. Ennis a pleasure to speak with you-Have a wonderful afternoon!!  Ms. Bark was given information about Medicaid Managed Care team care coordination services as a part of their [facility] Medicaid benefit. Sharalee Witman verbally consented to engagement with the [facility] Managed Care team.   If you are experiencing a medical emergency, please call 911 or report to your local emergency department or urgent care.   If you have a non-emergency medical problem during routine business hours, please contact your provider's office and ask to speak with a nurse.   For questions related to your [facility] health plan, please call: [phone] or go here:[email or web address]  If you would like to schedule transportation through your [facility] plan, please call the following number at least 2 days in advance of your appointment: [phone].  You can also use the MTM portal or MTM mobile app to manage your rides. For the portal, please go to [email or web address].  Call the [place] at [phone], at any time, 24 hours a day, 7 days a week. If you are in danger or need immediate medical attention call 911.  If you would like help to quit smoking, call 1-800-QUIT-NOW (717)762-1822) OR Espaol: 1-855-Djelo-Ya (3-329-518-8416) o para ms informacin haga clic aqu or Text READY to 200-400 to register via text  Ms. Fujikawa - following are the goals we discussed in your visit today:   Goals Addressed    Timeframe:  Long-Range Goal Priority:  High Start Date:     11/12/21                        Expected End Date:  ongoing                     Follow Up Date: 05/01/22   - schedule appointment for vaccines needed due to my age or health - schedule recommended health tests (blood work, mammogram, colonoscopy, pap test) - schedule and keep appointment for annual check-up    Why is this important?   Screening tests can find diseases early when they are  easier to treat.  Your doctor or nurse will talk with you about which tests are important for you.  Getting shots for common diseases like the flu and shingles will help prevent them.  03/31/22:  recently seen and evaluated by GI, has upcoming appts. With eye provider and CARDS  Patient verbalizes understanding of instructions and care plan provided today and agrees to view in [place]. Active MyChart status and patient understanding of how to access instructions and care plan via MyChart confirmed with patient.     The Managed Medicaid care management team will reach out to the patient again over the next 30 business  days.  The  Patient has been provided with contact information for the Managed Medicaid care management team and has been advised to call with any health related questions or concerns.   Aida Raider RN, BSN [place] Management Coordinator - Managed Medicaid High Risk [phone]   Following is a copy of your plan of care:  Care Plan : [place]  Updates made by Gayla Medicus, RN since 04/01/2022 12:00 AM     Problem: Knowledge Deficit and Care Coordination Needs Related to Management of HTN, HF, Type 2 DM   Priority: High     Long-Range Goal: Development of Plan of Care to Address Knowledge Deficits and Care  Coordination Needs related to  management of CHF, CAD, HTN, HLD, DMII, CKD Stage 4, and Depression   Start Date: 05/06/2021  Expected End Date: 05/06/2022  Priority: High  Note:   Current Barriers:  Knowledge Deficits related to plan of care for management of CHF, CAD, HTN, HLD, DMII, CKD Stage 4, and Depression 04/01/22:  BP improving, 145/87-some dizziness remains with walking but improving.  Blood sugars stable and pain improved.    RNCM Clinical Goal(s):  Patient will demonstrate ongoing adherence to prescribed treatment plan for CHF, CAD, HTN, HLD, DMII, CKD Stage 4, and Depression as evidenced by patient  report of improved health and quality of life and no unplanned ED or unplanned hospital admissions continue to work with RN Care Manager and/or Social Worker to address care management and care coordination needs related to CHF, CAD, HTN, HLD, DMII, CKD Stage 4, and Depression as evidenced by adherence to CM Team Scheduled appointments      Work with [facility] LCSW and psychiatrist to treat depression-patient states she does not feel she needs right now.  Interventions: Inter-disciplinary care team collaboration (see longitudinal plan of care) Evaluation of current treatment plan related to  self management and patient's adherence to plan as established by provider Mailed summary of benefits for her [place] Medicaid plan to her home address Collaborated with Care Guide for dental resources-completed. Care Guide referral for dental resources Collaborated with BSW for assistance with [facility] [place] appt.-completed. BSW referral for appt assistance. Collaborated with Pharmacy regarding  uncontrolled HTN Pharmacy referral for uncontrolled HTN-completed.   Heart Failure Interventions:  (Status: Goal on Track (progressing): YES.)  Long Term Goal - patient states her fluid retention has significantly improved,  says she cannot tolerate the compression stockings because they are too tight and make her legs hurt Assessed patient's heart failure management strategies  Diabetes:  (Status: Goal on Track (progressing): YES.) Long Term Goal   Lab Results  Component Value Date   HGBA1C 6.7 (H) 04/29/2021   Hgn A1C=6.3 on 12/26/21 Assessed patient's understanding of A1C goal: <7% Reviewed medications with patient and discussed importance of medication adherence;        Counseled on importance of regular laboratory monitoring as prescribed;        Discussed plans with patient for ongoing care management follow up and provided patient with direct contact information for care management team;      Advised  patient, providing education and rationale, to check cbg three times daily and record        call provider for findings outside established parameters;       Review of patient status, including review of consultants reports, relevant laboratory and other test results, and medications completed;       Discussed addition of Ozempic to medication regimen , reviewed mechanism of action and proven cardiovascular benefits  Hypertension and CKD stage 4 : (Status: Goal on Track (progressing): YES.) Long Term Goal-  has been placed on kidney transplant waiting list, fistu;a placed 12/09/21,ready for use 03/11/22. Last practice recorded BP readings:   BP 04/01/22-145/87 BP Readings from Last 3 Encounters:  09/30/21 120/70  09/12/21 (!) 107/58  09/08/21 (!) 144/71     Most recent eGFR/CrCl:    No components found for: CRCL Lab Results  Component Value Date   NA 136 08/19/2021   K 3.9 08/19/2021   CREATININE 2.51 (H) 08/19/2021   EGFR 19 (L) 03/13/2021   GFRNONAA 22 (L) 08/19/2021   GLUCOSE 122 (  H) 08/19/2021    Evaluation of current treatment plan related to hypertension self management and patient's adherence to plan as established by provider;   Reviewed medications with patient and discussed importance of compliance;  Discussed plans with patient for ongoing care management follow up and provided patient with direct contact information for care management team; Advised patient, providing education and rationale, to monitor blood pressure daily and record, calling PCP for findings outside established parameters;  Reviewed scheduled/upcoming provider appointments  Patient with labile blood pressures, medication adjusted, patient ot f/u with nephrologist.  Weight Loss Interventions:  (Status:  New goal.) Long Term Goal--patient has started Ozempic Discussed indications, mechanism of action, common side effects and CV benefits  of GLP1 Ozempic Assessed tolerance of Ozempic  Interdisciplinary  Collaboration:  (Status: Goal on Track (progressing): YES.) Long Term Goal  Collaborated with BSW to initiate plan of care to address needs related to Housing barriers, Inability to perform ADL's independently, and Inability to perform IADL's independently in patient with CHF, CAD, HTN, HLD, DM, CKD Stage 4, and Depression Assessed status of patient's involvement with behavioral health counselor Collaboration with pharmacist for medication review, discussion of ? Medication side effects causing chronic constipation and arranging for patient to received home delivery of medications in blister packs Collaboration with Nolene Ebbs, MD regarding development and update of comprehensive plan of care as evidenced by provider attestation and co-signature Inter-disciplinary care team collaboration (see longitudinal plan of care)  Patient Goals/Self-Care Activities: Take medications as prescribed   Attend all scheduled provider appointments Call pharmacy for medication refills 3-7 days in advance of running out of medications Attend church or other social activities Perform all self care activities independently  Call provider office for new concerns or questions  Work with the social worker to address care coordination needs and will continue to work with the clinical team to address health care and disease management related needs call office if I gain more than 2 pounds in one day or 5 pounds in one week keep legs up while sitting track weight in diary use salt in moderation watch for swelling in feet, ankles and legs every day weigh myself daily know when to call the doctorfor fluid weight gain or consistently abnormal BP readings at home track symptoms and what helps feel better or worse check blood sugar at prescribed times: three times daily enter blood sugar readings and medication or insulin into daily log take the blood sugar log to all doctor visits take the blood sugar meter to all  doctor visits eat fish at least once per week fill half of plate with vegetables manage portion size check blood pressure daily write blood pressure results in a log or diary learn about high blood pressure take blood pressure log to all doctor appointments call doctor for signs and symptoms of high blood pressure keep all doctor appointments take medications for blood pressure exactly as prescribed Limit fluid intake to 1 liter per day Collaborate with managed Medicaid team of BSW and pharmacist to address identified needs Work with cardiology pharmacist to assist with BP stabilization Work with [facility] LCSW and psychiatrist to treat depression Review summary of Well[employer] benefits mailed to home address and call to enroll in program that will benefit your chronic health issues Continue to use [place] Management calendar to record weight , BP and CBGs so readings are all in one place Schedule dental appt

## 2022-04-07 ENCOUNTER — Other Ambulatory Visit: Payer: Self-pay | Admitting: Cardiology

## 2022-04-07 ENCOUNTER — Other Ambulatory Visit (HOSPITAL_COMMUNITY): Payer: Self-pay

## 2022-04-07 DIAGNOSIS — E1142 Type 2 diabetes mellitus with diabetic polyneuropathy: Secondary | ICD-10-CM

## 2022-04-07 DIAGNOSIS — E113513 Type 2 diabetes mellitus with proliferative diabetic retinopathy with macular edema, bilateral: Secondary | ICD-10-CM

## 2022-04-07 MED ORDER — OZEMPIC (1 MG/DOSE) 4 MG/3ML ~~LOC~~ SOPN
1.0000 mg | PEN_INJECTOR | SUBCUTANEOUS | 0 refills | Status: DC
  Filled 2022-04-07: qty 3, 28d supply, fill #0

## 2022-04-09 ENCOUNTER — Telehealth: Payer: Self-pay | Admitting: *Deleted

## 2022-04-09 NOTE — Telephone Encounter (Signed)
Patient called in to report that she received CPAP supplies from [place], however they are incorrect. She was deferred to [place] for assistance.

## 2022-04-10 ENCOUNTER — Telehealth: Payer: Self-pay | Admitting: Gastroenterology

## 2022-04-10 ENCOUNTER — Other Ambulatory Visit (HOSPITAL_COMMUNITY): Payer: Self-pay

## 2022-04-10 DIAGNOSIS — E1122 Type 2 diabetes mellitus with diabetic chronic kidney disease: Secondary | ICD-10-CM | POA: Diagnosis not present

## 2022-04-10 DIAGNOSIS — I10 Essential (primary) hypertension: Secondary | ICD-10-CM | POA: Diagnosis not present

## 2022-04-10 DIAGNOSIS — I509 Heart failure, unspecified: Secondary | ICD-10-CM | POA: Diagnosis not present

## 2022-04-10 MED ORDER — OZEMPIC (0.25 OR 0.5 MG/DOSE) 2 MG/3ML ~~LOC~~ SOPN
0.5000 mg | PEN_INJECTOR | SUBCUTANEOUS | 5 refills | Status: DC
  Filled 2022-04-10: qty 3, 28d supply, fill #0

## 2022-04-10 NOTE — Telephone Encounter (Signed)
The pt wanted to update that she has had a great response to the linzess.  She has been emptying her bowels all day and says she has lost 4 pounds.  She will call back if she has any further concerns.

## 2022-04-10 NOTE — Telephone Encounter (Signed)
Inbound call from patient stating she has been having diarrhea today and before that unable to have a bowel movement for four days. Please advise.

## 2022-04-13 ENCOUNTER — Other Ambulatory Visit: Payer: Medicaid Other | Admitting: Pharmacist

## 2022-04-13 NOTE — Progress Notes (Signed)
04/13/2022 Name: Glenyce Randle MRN: [id number] DOB: December 13, 1960  Chief Complaint  Patient presents with   Medication Management   Hypertension   Diabetes    Kashauna Celmer is a 61 y.o. year old female who presented for a telephone visit.   They were referred to the pharmacist by their Case Management Team  for assistance in managing diabetes and hypertension.   Patient is participating in a Managed Medicaid Plan:  Yes  Subjective:  Care Team: Primary Care Provider: Nolene Ebbs, MD ;  Cardiologist: Aundra Dubin; Next Scheduled Visit: 04/17/22 Endocrinologist Canos ([facility]); Next Scheduled Visit: 05/25/22 Nephrology: Royce Macadamia; Next Scheduled Visit: December  Medication Access/Adherence  Current Pharmacy:  [id number] - [place], [place] - [address] [place] [address] Phone: [phone] Fax: [phone]   Patient reports affordability concerns with their medications: No  Patient reports access/transportation concerns to their pharmacy: No  Patient reports adherence concerns with their medications:  No     Diabetes:  Current medications: Ozempic 1 mg weekly, Novolog 3 units with meals, Lantus 10 units daily  Current glucose readings: fasting: 88-113; 2 hour post prandial: per memory, reports everything <180  Using DexCom, but needs to call to discuss reconnecting to her phone  Patient denies hypoglycemic s/sx including dizziness, shakiness, sweating. Patient denies hyperglycemic symptoms including polyuria, polydipsia, polyphagia, nocturia, neuropathy, blurred vision.  Current meal patterns: reports some sweets lately.   Reports last A1c per PCP was 5.6%  Nephrology appt in December.   Heart Failure:  Current medications:  ACEi/ARB/ARNI: limited by renal function SGLT2i: none, consider Beta blocker: carvedilol 25 mg twice daily Mineralocorticoid Receptor Antagonist: spironolactone 25 mg daily Diuretic  regimen: torsemide 40 mg + potassium 20 mEq 3 days weekly, torsemide 20 mg all other days Additional antihypertensives: hydralazine 100 mg three times daily, isosorbide 90 mg daily, amlodipine 10 mg daily, clonidine 0.3 mg three times daily  Current home blood pressure readings: elevated; reports reading this morning was 154/81 with having recently moved amlodipine to the evenings   Patient reports volume overload signs or symptoms including lower extremity edema.   Reports someone previously attempted PA for [place] but it was denied. Suspect this is due to not completing appropriately with multiple indications (HF, CKD, and DM with intolerance to metformin)   Health Maintenance  Health Maintenance Due  Topic Date Due   OPHTHALMOLOGY EXAM  Never done   Hepatitis C Screening  Never done   Zoster Vaccines- Shingrix (1 of 2) Never done   PAP SMEAR-Modifier  Never done   MAMMOGRAM  Never done   COVID-19 Vaccine (6 - Mixed Product risk series) 06/28/2021   HEMOGLOBIN A1C  10/27/2021   INFLUENZA VACCINE  01/13/2022     Objective: Lab Results  Component Value Date   HGBA1C 6.7 (H) 04/29/2021    Lab Results  Component Value Date   CREATININE 2.25 (H) 03/12/2022   BUN 61 (H) 03/12/2022   NA 142 03/12/2022   K 3.6 03/12/2022   CL 113 (H) 03/12/2022   CO2 20 (L) 03/12/2022    Lab Results  Component Value Date   CHOL 156 09/25/2021   HDL 58 09/25/2021   LDLCALC 78 09/25/2021   TRIG 109 09/25/2021   CHOLHDL 2.7 09/25/2021    Medications Reviewed Today     Reviewed by Osker Mason, RPH-CPP (Pharmacist) on 04/13/22 at [phone]  Med List Status: <None>   Medication Order Taking? Sig Documenting  Provider Last Dose Status Informant  Accu-Chek Softclix Lancets lancets [id number]  3 (three) times daily. [provider]  Active Self  acetaminophen (TYLENOL) 500 MG tablet [id number] Yes Take 1,000 mg by mouth every 6 (six) hours as needed for moderate pain or headache.  [provider] Taking Active Self  ACIDOPHILUS LACTOBACILLUS PO [id number] Yes Take 1 Capful by mouth daily. [provider] Taking Active Self  allopurinol (ZYLOPRIM) 100 MG tablet [id number] Yes Take 100 mg by mouth in the morning. [provider] Taking Active Self  amLODipine (NORVASC) 10 MG tablet [id number] Yes Take 10 mg by mouth daily. [provider] Taking Active   aspirin EC 81 MG tablet [id number] Yes Take 81 mg by mouth in the morning. Swallow whole. [provider] Taking Active Self           Med Note [name]   Mon Apr 14, 2021  9:22 PM)    atorvastatin (LIPITOR) 40 MG tablet [id number] Yes Take 1 tablet (40 mg total) by mouth every evening. Skeet Latch, MD Taking Active Self  bu[facility]) 100 MG tablet [id number] Yes Take 100 mg by mouth 2 (two) times daily. [provider] Taking Active Self  carvedilol (COREG) 25 MG tablet [id number] Yes Take 1 tablet (25 mg total) by mouth 2 (two) times daily with a meal. [name], [name], MD Taking Active Self  cholecalciferol (VITAMIN D3) 25 MCG (1000 UNIT) tablet [id number] Yes Take 2,000 Units by mouth daily. [provider] Taking Active Self  cloNIDine (CATAPRES) 0.3 MG tablet [id number] Yes Take 0.3 mg by mouth 3 (three) times daily. [provider] Taking Active Self           Med Note Stanford [place]   Tue Mar 03, 2022  9:23 AM)    diclofenac Sodium (VOLTAREN) 1 % GEL [id number] No Apply 2 g topically 4 (four) times daily as needed (chest pain.).  Patient not taking: Reported on 04/13/2022   [provider] Not Taking Active Self  ferrous sulfate 325 (65 FE) MG tablet [id number] Yes Take 325 mg by mouth 2 (two) times daily. [provider] Taking Active Self  fluticasone (FLONASE) 50 MCG/ACT nasal spray [id number] Yes Place 1 spray into both nostrils daily as needed for allergies or rhinitis. [provider] Taking  Active Self  hydrALAZINE (APRESOLINE) 100 MG tablet [id number] Yes Take 100 mg by mouth 3 (three) times daily. [provider] Taking Active Self  insulin glargine (LANTUS) 100 unit/mL SOPN [id number] Yes Inject 10 Units into the skin at bedtime. [provider] Taking Active Self           Med Note Stanford [place]   Tue Aug 19, 2021  9:37 AM)    isosorbide mononitrate (IMDUR) 60 MG 24 hr tablet [id number] Yes Take 1.5 tablets (90 mg total) by mouth daily. Allena Katz [place], [place] Taking Active Self  LINZESS 72 MCG capsule [id number] Yes Take 72 mcg by mouth every morning. [provider] Taking Active   loratadine (CLARITIN) 10 MG tablet [id number] Yes Take 10 mg by mouth in the morning. [provider] Taking Active Self  Multiple Vitamin (MULTIVITAMIN ADULT) TABS [id number] Yes Take 1 tablet by mouth in the morning. [provider] Taking Active Self  nitroGLYCERIN (NITROSTAT) 0.4 MG SL tablet [id number]  Place 1 tablet (0.4 mg total) under the tongue every 5 (five) minutes as needed for chest pain. [name], [name], MD  Active Self  Med Note Stanford [place]   Tue Aug 19, 2021  9:35 AM)    NOVOLOG FLEXPEN 100 UNIT/ML FlexPen [id number] Yes Inject 4 Units into the skin 3 (three) times daily with meals. [provider] Taking Active Self           Med Note Jodi Mourning, Toney Reil Mar 16, 2022 11:03 AM) 3 units with meals  Omega-3 Fatty Acids (FISH OIL) 1200 MG CAPS [id number] Yes Take 1,200 mg by mouth in the morning. [provider] Taking Active Self  Polyethylene Glycol 3350 (MIRALAX PO) [id number] Yes Take by mouth. [provider] Taking Active   potassium chloride SA (KLOR-CON M) 20 MEQ tablet [id number] Yes Take 20 mEq by mouth daily. [provider] Taking Active            Med Note Jodi Mourning, Toney Reil Apr 13, 2022  9:26 AM) Dewaine Conger ~ 3 times weekly, on days she takes torsemide 40 mg    pregabalin (LYRICA) 25 MG capsule [id number] Yes Take 25 mg by mouth 2 (two) times daily. [provider] Taking Active Self  Semaglutide, 1 MG/DOSE, (OZEMPIC, 1 MG/DOSE,) 4 MG/3ML SOPN [id number] Yes Inject 1 mg into the skin once a week. Buford Dresser, MD Taking Active   sertraline (ZOLOFT) 50 MG tablet [id number] Yes Take 50 mg by mouth in the morning. [provider] Taking Active Self  spironolactone (ALDACTONE) 25 MG tablet [id number] Yes Take 25 mg by mouth daily. [provider] Taking Active   torsemide (DEMADEX) 20 MG tablet [id number] Yes Take 2 tablets (40 mg total) by mouth daily.  Patient taking differently: Take 40 mg by mouth. 40 mg three times weekly, 20 mg the other days   Larey Dresser, MD Taking Active   traZODone (DESYREL) 50 MG tablet [id number] Yes Take 50 mg by mouth at bedtime. [provider] Taking Active Self  TURMERIC CURCUMIN PO [id number] Yes Take by mouth. [provider] Taking Active               Assessment/Plan:   Diabetes: - Currently controlled - Patient reports she plans to call DexCom help desk for support in reconnecting her device to her phone. Encouraged to do that, especially given how well controlled A1c is and risk for hypoglycemia given renal function.    Heart Failure: - Currently with opportunity for optimization - Completed PA for [place]. Will communicate with HF team.   Follow Up Plan: phone call this week with PA results  Catie Hedwig Morton, PharmD, [place] [phone]

## 2022-04-13 NOTE — Patient Instructions (Signed)
Brigit,   It was great talking with you today!  I'll follow up with your insurance regarding the prior authorization for the Farxiga - this medication is good for heart failure, kidneys, and diabetes.   Check your blood pressure once daily, and any time you have concerning symptoms like headache, chest pain, dizziness, shortness of breath, or vision changes.   Our goal is less than 130/80.  To appropriately check your blood pressure, make sure you do the following:  1) Avoid caffeine, exercise, or tobacco products for 30 minutes before checking. Empty your bladder. 2) Sit with your back supported in a flat-backed chair. Rest your arm on something flat (arm of the chair, table, etc). 3) Sit still with your feet flat on the floor, resting, for at least 5 minutes.  4) Check your blood pressure. Take 1-2 readings.  5) Write down these readings and bring with you to any provider appointments.  Bring your home blood pressure machine with you to a provider's office for accuracy comparison at least once a year.   Make sure you take your blood pressure medications before you come to any office visit, even if you were asked to fast for labs.  Take care!  Catie Hedwig Morton, PharmD, [facility] [phone]

## 2022-04-15 DIAGNOSIS — Z419 Encounter for procedure for purposes other than remedying health state, unspecified: Secondary | ICD-10-CM | POA: Diagnosis not present

## 2022-04-15 DIAGNOSIS — G4733 Obstructive sleep apnea (adult) (pediatric): Secondary | ICD-10-CM | POA: Diagnosis not present

## 2022-04-16 ENCOUNTER — Other Ambulatory Visit: Payer: Self-pay | Admitting: Pharmacist

## 2022-04-16 DIAGNOSIS — Z1231 Encounter for screening mammogram for malignant neoplasm of breast: Secondary | ICD-10-CM | POA: Diagnosis not present

## 2022-04-16 NOTE — Progress Notes (Signed)
Care Coordination Call  PA for Farxiga approved 04/13/22-04/13/23. Will notify cardiology prior to appointment tomorrow.   Contacted patient - she verbalizes understanding.    Catie Hedwig Morton, PharmD, [facility] [phone]

## 2022-04-16 NOTE — Patient Instructions (Signed)
Hi Ms. Mantell,   The prior authorization for Farxiga (the medication that is good for your heart, your kidneys, and your blood sugars) was approved for a calendar year. Please let the heart failure team, and/or your kidney doctor know so they can prescribe if for you if they are still interested.   Thanks!  Catie Hedwig Morton, PharmD, [facility] [phone]

## 2022-04-17 ENCOUNTER — Ambulatory Visit (HOSPITAL_COMMUNITY)
Admission: RE | Admit: 2022-04-17 | Discharge: 2022-04-17 | Disposition: A | Discharge status: Home / Self Care | Payer: Medicaid Other | Source: Ambulatory Visit | Attending: Family Medicine | Admitting: Family Medicine

## 2022-04-17 ENCOUNTER — Other Ambulatory Visit (HOSPITAL_COMMUNITY): Payer: Self-pay

## 2022-04-17 ENCOUNTER — Encounter (HOSPITAL_COMMUNITY): Payer: Self-pay

## 2022-04-17 VITALS — BP 154/70 | HR 87 | Wt 209.0 lb

## 2022-04-17 DIAGNOSIS — I252 Old myocardial infarction: Secondary | ICD-10-CM | POA: Insufficient documentation

## 2022-04-17 DIAGNOSIS — I2510 Atherosclerotic heart disease of native coronary artery without angina pectoris: Secondary | ICD-10-CM | POA: Insufficient documentation

## 2022-04-17 DIAGNOSIS — E669 Obesity, unspecified: Secondary | ICD-10-CM | POA: Diagnosis not present

## 2022-04-17 DIAGNOSIS — I130 Hypertensive heart and chronic kidney disease with heart failure and stage 1 through stage 4 chronic kidney disease, or unspecified chronic kidney disease: Secondary | ICD-10-CM | POA: Diagnosis not present

## 2022-04-17 DIAGNOSIS — I5032 Chronic diastolic (congestive) heart failure: Secondary | ICD-10-CM | POA: Insufficient documentation

## 2022-04-17 DIAGNOSIS — E1142 Type 2 diabetes mellitus with diabetic polyneuropathy: Secondary | ICD-10-CM | POA: Diagnosis not present

## 2022-04-17 DIAGNOSIS — Z79899 Other long term (current) drug therapy: Secondary | ICD-10-CM | POA: Diagnosis not present

## 2022-04-17 DIAGNOSIS — I1A0 Resistant hypertension: Secondary | ICD-10-CM

## 2022-04-17 DIAGNOSIS — I2583 Coronary atherosclerosis due to lipid rich plaque: Secondary | ICD-10-CM | POA: Diagnosis not present

## 2022-04-17 DIAGNOSIS — Z6836 Body mass index (BMI) 36.0-36.9, adult: Secondary | ICD-10-CM | POA: Diagnosis not present

## 2022-04-17 DIAGNOSIS — I951 Orthostatic hypotension: Secondary | ICD-10-CM | POA: Diagnosis not present

## 2022-04-17 DIAGNOSIS — M109 Gout, unspecified: Secondary | ICD-10-CM | POA: Insufficient documentation

## 2022-04-17 DIAGNOSIS — E1122 Type 2 diabetes mellitus with diabetic chronic kidney disease: Secondary | ICD-10-CM | POA: Diagnosis not present

## 2022-04-17 DIAGNOSIS — G4733 Obstructive sleep apnea (adult) (pediatric): Secondary | ICD-10-CM | POA: Diagnosis not present

## 2022-04-17 DIAGNOSIS — Z794 Long term (current) use of insulin: Secondary | ICD-10-CM | POA: Insufficient documentation

## 2022-04-17 DIAGNOSIS — Z7982 Long term (current) use of aspirin: Secondary | ICD-10-CM | POA: Insufficient documentation

## 2022-04-17 DIAGNOSIS — N184 Chronic kidney disease, stage 4 (severe): Secondary | ICD-10-CM | POA: Diagnosis not present

## 2022-04-17 DIAGNOSIS — Z8616 Personal history of COVID-19: Secondary | ICD-10-CM | POA: Insufficient documentation

## 2022-04-17 LAB — BASIC METABOLIC PANEL
Anion gap: 11 (ref 5–15)
BUN: 67 mg/dL — ABNORMAL HIGH (ref 6–20)
CO2: 22 mmol/L (ref 22–32)
Calcium: 9.1 mg/dL (ref 8.9–10.3)
Chloride: 106 mmol/L (ref 98–111)
Creatinine, Ser: 2.52 mg/dL — ABNORMAL HIGH (ref 0.44–1.00)
GFR, Estimated: 21 mL/min — ABNORMAL LOW (ref >60)
Glucose, Bld: 139 mg/dL — ABNORMAL HIGH (ref 70–99)
Potassium: 4.1 mmol/L (ref 3.5–5.1)
Sodium: 139 mmol/L (ref 135–145)

## 2022-04-17 LAB — BRAIN NATRIURETIC PEPTIDE: B Natriuretic Peptide: 81 pg/mL (ref 0.0–100.0)

## 2022-04-17 MED ORDER — TORSEMIDE 20 MG PO TABS: 20.0000 mg | ORAL_TABLET | Freq: Every day | ORAL | 3 refills | Status: DC

## 2022-04-17 NOTE — Patient Instructions (Signed)
Labs done today. We will contact you only if your labs are abnormal.  START Farxiga 10mg  (1 tablet) by mouth daily. Please contact us if you start experiencing yeast infection or UTI symptoms.   DECREASE Torsemide to 20mg  (1 tablet) by mouth daily.    No other medication changes were made. Please continue all current medications as prescribed.  Your physician recommends that you schedule a follow-up appointment in: 3-4 months with Dr. Aundra Dubin. Please contact our office in December to schedule a January/February appointment.   If you have any questions or concerns before your next appointment please send [place] a message through [place] or call our office at [phone].    TO LEAVE A MESSAGE FOR THE NURSE SELECT OPTION 2, PLEASE LEAVE A MESSAGE INCLUDING: YOUR NAME DATE OF BIRTH CALL BACK NUMBER REASON FOR CALL**this is important as we prioritize the call backs  YOU WILL RECEIVE A CALL BACK THE SAME DAY AS LONG AS YOU CALL BEFORE 4:00 PM   Do the following things EVERYDAY: Weigh yourself in the morning before breakfast. Write it down and keep it in a log. Take your medicines as prescribed Eat low salt foods--Limit salt (sodium) to 2000 mg per day.  Stay as active as you can everyday Limit all fluids for the day to less than 2 liters   At the [facility], you and your health needs are our priority. As part of our continuing mission to provide you with exceptional heart care, we have created designated Provider Care Teams. These Care Teams include your primary Cardiologist (physician) and Advanced Practice Providers (APPs- Physician Assistants and Nurse Practitioners) who all work together to provide you with the care you need, when you need it.   You may see any of the following providers on your designated Care Team at your next follow up: Dr Glori Bickers Dr Haynes Kerns, NP Lyda Jester, [place] Audry Riles, PharmD   Please be sure to bring in all  your medications bottles to every appointment.

## 2022-04-17 NOTE — Progress Notes (Signed)
PCP: Nolene Ebbs, MD Nephrology: Dr. Royce Macadamia HF Cardiology: Dr. Aundra Dubin  61 y.o. with history of supine hypertension with orthostatic hypotension, CKD stage 4, CAD, and chronic diastolic CHF was referred from [facility] clinic for evaluation of CHF.    Patient had MI in 05/2013. LHC showed 20% LAD, 50 to 60% left circumflex, and 100% RPDA stenosis with left-to-right collaterals. CAD managed medically.    She reports a long h/o diastolic heart failure and frequent hospitalizations. She moved to [place] from [facility], had been followed by [facility]. Has struggled w/ volume control and readjustment of diuretics over the years. She reports previously being prescribed Wilder Glade but had to stop b/c her insurance would not cover it. Her last echo at [facility] was 7/21. LVEF was 55%, RV normal. RVSP elevated at 60 mmHg. PYP at [facility] was negative for amyloid (grade 1, H/CL 1.21). Serum IFE not suggestive of monoclonal gammopathy. Urine IFE and immunoglobin free light chains were elevated (lg FLCK 9.24, Ig FLCL 3.75, K/L ratio 2.46. Repeat testing recommended 6 months later, but do not see that this was completed).    Referred to HTN clinic 9/22 for uncontrolled HTN. Now on multidrug regimen. Thyroid function, renin, and aldosterone were normal, as were catecholamines and metanephrines. Her PTH was elevated at 134 but calcium was normal. Renal artery doppler study was limited. There was no evidence of right RAS however the left renal artery was not well visualized due to the presence of bowel gas.  Echo was repeated 9/22 showing normal LVEF 55-60%, G1DD and normal RV.    She has struggled w/ fluid management recently, c/b stage IV CKD. Baseline SCr ~2.3.  Followed by nephrology. Previously on Bumex and metolazone. She was admitted 19/62 for a/c diastolic CHF and diuresed w/ IV Lasix. Did not get RHC. Transitioned back to PO diuretics, Torsemide 40 mg once daily + spiro 25 mg daily. D/w Wt 231 lb. Referred to [facility] clinic.   She  was admitted again in 11/22 with chest pain, AKI, and orthostatic hypotension.  Torsemide was stopped.  HS-TnI was negative, suspect musculoskeletal chest pain.    PYP study repeated in 12/22 was not suggestive of cardiac amyloidosis.   Follow up 3/23, amlodipine stopped and nifedipine started for supine hypertension with orthostatic hypotension. Stable symptoms and she was not volume overloaded.  She had an AV fistula placed.    Today she returns for HF follow up. Overall feeling fine. She does not have dyspnea walking further distances on flat ground as long as she takes her time. Has had mild dizziness for past 3 days, no falls. Continues with chronic chest pain, nonexertional. Denies palpitations, abnormal bleeding, CP, edema, or PND/Orthopnea. Appetite ok. No fever or chills. She does not weigh at home. Taking all medications. Wears CPAP at night.  Labs (11/22): K 3.8, creatinine 3.6 => 2.62 Labs (2/23): K 3.7, creatinine 3.31  Labs (4/23): K 4.5, creatinine 2.69, LDL 78, HDL 58 Labs (6/23): K 4.2, creatinine 2.65 Labs (9/23): K 3.6, creatinine 2.25  PMH: 1. HTN: Urine catecholamines and metanephrines normal level, aldosterone normal level.  Complicated by orthostatic hypotension.   - Renal artery dopplers (11/22): No right renal artery stenosis, unable to visualize left renal artery due to overlying bowel gas.  2. Orthostatic hypotension 3. CKD stage 4 4. Type 2 diabetes 5. OSA: Uses CPAP 6. CAD: LHC in 2014 with totally occluded RCA with collaterals, medical management.  7. Chronic diastolic CHF: Echo (2/29) with EF 55%, normal RV,  PASP 60 mmHg.  - PYP scan 11/20 at [facility] was negative with grade 1, H/CL 1.21.  - Echo (9/22): EF 55-60%, mild LVH, normal RV, IVC normal.  - RHC (11/22): mean RA 1, PA 41/10 mean 24, mean PCWP 12, CI 3.  - PYP scan (12/22): grade 1, H/CL 1.  8. Gout 9. COVID-19 2/23  Social History   Socioeconomic History   Marital status: Divorced    Spouse  name: Not on file   Number of children: 2   Years of education: Not on file   Highest education level: Some college, no degree  Occupational History   Occupation: retired   Occupation: disabled  Tobacco Use   Smoking status: Never    Passive exposure: Never   Smokeless tobacco: Never  Vaping Use   Vaping Use: Never used  Substance and Sexual Activity   Alcohol use: Never   Drug use: Never   Sexual activity: Not on file  Other Topics Concern   Not on file  Social History Narrative   Not on file   Social Determinants of Health   Financial Resource Strain: Low Risk  (12/11/2021)   Overall Financial Resource Strain (CARDIA)    Difficulty of Paying Living Expenses: Not very hard  Food Insecurity: No Food Insecurity (11/12/2021)   Hunger Vital Sign    Worried About Running Out of Food in the Last Year: Never true    Ran Out of Food in the Last Year: Never true  Transportation Needs: No Transportation Needs (01/19/2022)   PRAPARE - Hydrologist (Medical): No    Lack of Transportation (Non-Medical): No  Physical Activity: Inactive (04/01/2022)   Exercise Vital Sign    Days of Exercise per Week: 0 days    Minutes of Exercise per Session: 0 min  Stress: No Stress Concern Present (01/19/2022)   [place]    Feeling of Stress : Only a little  Social Connections: Moderately Isolated (02/25/2022)   Social Connection and Isolation Panel [NHANES]    Frequency of Communication with Friends and Family: More than three times a week    Frequency of Social Gatherings with Friends and Family: More than three times a week    Attends Religious Services: More than 4 times per year    Active Member of [employer] or Organizations: No    Attends Archivist Meetings: Never    Marital Status: Divorced  Human resources officer Violence: Not At Risk (04/01/2022)   Humiliation, Afraid, Rape, and Kick questionnaire     Fear of Current or Ex-Partner: No    Emotionally Abused: No    Physically Abused: No    Sexually Abused: No   Family History  Problem Relation Age of Onset   Hypertension Mother    Stroke Mother    Diabetes Mother    Other Mother        enlarged heart   Hypertension Father    Hypertension Sister    Diabetes Sister    CAD Sister        enlarged heart   Kidney disease Sister    Diabetes Paternal Grandmother    Hypertension Daughter    Diabetes Daughter    Hypertension Son    Diabetes Son    Breast cancer Paternal Aunt    ROS: All systems reviewed and negative except as per HPI.   Current Outpatient Medications  Medication Sig Dispense Refill   Accu-Chek  Softclix Lancets lancets 3 (three) times daily.     acetaminophen (TYLENOL) 500 MG tablet Take 1,000 mg by mouth every 6 (six) hours as needed for moderate pain or headache.     ACIDOPHILUS LACTOBACILLUS PO Take 1 Capful by mouth daily.     allopurinol (ZYLOPRIM) 100 MG tablet Take 100 mg by mouth in the morning.     amLODipine (NORVASC) 10 MG tablet Take 10 mg by mouth daily.     aspirin EC 81 MG tablet Take 81 mg by mouth in the morning. Swallow whole.     atorvastatin (LIPITOR) 40 MG tablet Take 1 tablet (40 mg total) by mouth every evening. 90 tablet 3   buPROPion (WELLBUTRIN) 100 MG tablet Take 100 mg by mouth 2 (two) times daily.     carvedilol (COREG) 25 MG tablet Take 1 tablet (25 mg total) by mouth 2 (two) times daily with a meal. 60 tablet 0   cholecalciferol (VITAMIN D3) 25 MCG (1000 UNIT) tablet Take 2,000 Units by mouth daily.     cloNIDine (CATAPRES) 0.3 MG tablet Take 0.3 mg by mouth 3 (three) times daily.     diclofenac Sodium (VOLTAREN) 1 % GEL Apply 2 g topically 4 (four) times daily as needed (chest pain.).     ferrous sulfate 325 (65 FE) MG tablet Take 325 mg by mouth 2 (two) times daily.     fluticasone (FLONASE) 50 MCG/ACT nasal spray Place 1 spray into both nostrils daily as needed for allergies or  rhinitis.     hydrALAZINE (APRESOLINE) 100 MG tablet Take 100 mg by mouth 3 (three) times daily.     insulin glargine (LANTUS) 100 unit/mL SOPN Inject 10 Units into the skin at bedtime.     isosorbide mononitrate (IMDUR) 60 MG 24 hr tablet Take 1.5 tablets (90 mg total) by mouth daily. 135 tablet 3   LINZESS 72 MCG capsule Take 72 mcg by mouth every morning.     loratadine (CLARITIN) 10 MG tablet Take 10 mg by mouth in the morning.     Multiple Vitamin (MULTIVITAMIN ADULT) TABS Take 1 tablet by mouth in the morning.     nitroGLYCERIN (NITROSTAT) 0.4 MG SL tablet Place 1 tablet (0.4 mg total) under the tongue every 5 (five) minutes as needed for chest pain. 20 tablet 0   NOVOLOG FLEXPEN 100 UNIT/ML FlexPen Inject 3 Units into the skin 3 (three) times daily with meals.     Omega-3 Fatty Acids (FISH OIL) 1200 MG CAPS Take 1,200 mg by mouth in the morning.     Polyethylene Glycol 3350 (MIRALAX PO) Take by mouth daily as needed.     potassium chloride SA (KLOR-CON M) 20 MEQ tablet Take 20 mEq by mouth daily.     pregabalin (LYRICA) 25 MG capsule Take 25 mg by mouth 2 (two) times daily.     Semaglutide, 1 MG/DOSE, (OZEMPIC, 1 MG/DOSE,) 4 MG/3ML SOPN Inject 1 mg into the skin once a week. 3 mL 0   sertraline (ZOLOFT) 50 MG tablet Take 50 mg by mouth in the morning.     spironolactone (ALDACTONE) 25 MG tablet Take 25 mg by mouth daily.     torsemide (DEMADEX) 20 MG tablet Take 2 tablets (40 mg total) by mouth daily. (Patient taking differently: Take 40 mg by mouth. 40 mg three times weekly, 20 mg the other days) 90 tablet 3   traZODone (DESYREL) 50 MG tablet Take 50 mg by mouth at bedtime.  TURMERIC CURCUMIN PO Take by mouth.     No current facility-administered medications for this encounter.   Wt Readings from Last 3 Encounters:  04/17/22 94.8 kg (209 lb)  03/30/22 94.8 kg (209 lb)  03/03/22 96.8 kg (213 lb 6.4 oz)   BP (!) 154/70   Pulse 87   Wt 94.8 kg (209 lb)   SpO2 98%   BMI 36.44  kg/m  Physical Exam General:  NAD. No resp difficulty, walked into clinic with cane. HEENT: Normal Neck: Supple. JVP 10. Carotids 2+ bilat; no bruits. No lymphadenopathy or thryomegaly appreciated. Cor: PMI nondisplaced. Regular rate & rhythm. No rubs, gallops or murmurs. Lungs: Clear Abdomen: Soft, nontender, nondistended. No hepatosplenomegaly. No bruits or masses. Good bowel sounds. Extremities: No cyanosis, clubbing, rash, 1+ BLE edema to knees. Neuro: Alert & oriented x 3, cranial nerves grossly intact. Moves all 4 extremities w/o difficulty. Affect pleasant.  Assessment/Plan: 1. HTN: BP mildly elevated this morning, but typically week controlled on current regimen. - Continue Coreg 25 mg bid, clonidine at current dose, hydralazine 100 mg tid and Imdur 90 mg daily.   - Continue amlodipine 10 mg daily.   2. Chronic diastolic CHF: Echo in 2/94 with EF 55-60%, normal RV. RHC in 11/22 with optimized filling pressures.  PYP scan, myeloma studies were not suggestive of cardiac amyloidosis. She has struggled with volume overload in setting of CKD stage IV.  She is mildly volume overloaded today with NYHA class III symptoms. - Start Farxiga 10 mg daily. BMET and BNP today. We discussed possible GU side effects, I asked her to notify me with any GU symptoms. Last gFR 24 - Decrease torsemide to 20 mg daily.  - With Medicaid, unable to get Cardiomems.  3. CKD stage IV: BMET today.  - She is followed by Dr. Royce Macadamia with [facility]. 4. CAD: H/o occluded PDA with collaterals.  No changes to chronic chest pain pattern.  - Continue ASA 81 daily.  - Continue atorvastatin, good lipids 4/23. 5. Obesity: Body mass index is 36.44 kg/m. - She is on Ozempic. 6. OSA: Continue CPAP. 7. DM2: On insulin. Will notify PCP of starting SGLT2i.  Follow up in 3-4 months with Dr. Aundra Dubin.  [facility] FNP-BC 04/17/2022

## 2022-04-17 NOTE — Addendum Note (Signed)
Encounter addended by: Shonna Chock, CMA on: 65/12/8467 2:57 PM  Actions taken: Charge Capture section accepted

## 2022-04-20 ENCOUNTER — Other Ambulatory Visit: Payer: Self-pay

## 2022-04-20 ENCOUNTER — Telehealth (HOSPITAL_COMMUNITY): Payer: Self-pay

## 2022-04-20 MED ORDER — DAPAGLIFLOZIN PROPANEDIOL 10 MG PO TABS: 10.0000 mg | ORAL_TABLET | Freq: Every day | ORAL | 11 refills | Status: DC

## 2022-04-20 MED ORDER — TORSEMIDE 20 MG PO TABS: 20.0000 mg | ORAL_TABLET | Freq: Every day | ORAL | 3 refills | Status: DC | PRN

## 2022-04-20 MED ORDER — LINACLOTIDE 145 MCG PO CAPS: 145.0000 ug | ORAL_CAPSULE | Freq: Every day | ORAL | 11 refills | Status: DC

## 2022-04-20 NOTE — Telephone Encounter (Signed)
Patient called states her pharmacy haven't called her regarding her linzess. States she was taking 65 MCG and was told It will be increasing. Requesting a call bak. Please call to advise.

## 2022-04-20 NOTE — Telephone Encounter (Signed)
Patient advised and verbalized understanding. Med list updated to reflect changes. Farxiga refill sent in as well.   Meds ordered this encounter  Medications   dapagliflozin propanediol (FARXIGA) 10 MG TABS tablet    Sig: Take 1 tablet (10 mg total) by mouth daily before breakfast.    Dispense:  30 tablet    Refill:  11   torsemide (DEMADEX) 20 MG tablet    Sig: Take 1 tablet (20 mg total) by mouth daily as needed.    Dispense:  90 tablet    Refill:  3    Please cancel all previous orders for current medication. Change in dosage or pill size.

## 2022-04-20 NOTE — Telephone Encounter (Signed)
The pt has been advised that the prescription has been sent as per order in the last office note per Alonza Bogus PA

## 2022-04-20 NOTE — Telephone Encounter (Signed)
-----   Message from Rafael Bihari, [place] sent at 04/17/2022  3:57 PM EDT ----- Change torsemide to 20 mg PRN

## 2022-05-01 ENCOUNTER — Encounter: Payer: Self-pay | Admitting: Obstetrics and Gynecology

## 2022-05-01 ENCOUNTER — Other Ambulatory Visit: Payer: Medicaid Other | Admitting: Obstetrics and Gynecology

## 2022-05-01 NOTE — Patient Outreach (Signed)
Medicaid Managed Care   Nurse Care Manager Note  05/01/2022 Name:  Maria Hudson MRN:  [id number] DOB:  1961-03-12  Maria Hudson is an 61 y.o. year old female who is a primary patient of Maria Ebbs, MD.  The [facility] Managed Care Coordination team was consulted for assistance with:    Chronic healthcare management issues, neuropathy, retinopathy, HTN, CKD, DM, CAD, OSA, chronic pain  [name] was given information about Medicaid Managed Care Coordination team services today. Maria Hudson Patient agreed to services and verbal consent obtained.  [name] for follow up visit in response to provider referral for case management and/or care coordination services.   Assessments/Interventions:  Review of past medical history, allergies, medications, health status, including review of consultants reports, laboratory and other test data, was performed as part of comprehensive evaluation and provision of chronic care management services.  SDOH (Social Determinants of Health) assessments and interventions performed: SDOH Interventions    Flowsheet Row Patient Outreach Telephone from 05/01/2022 in [place] Patient Outreach Telephone from 04/01/2022 in [place] Patient Outreach Telephone from 02/25/2022 in [place] Patient Outreach Telephone from 01/19/2022 in [place] Patient Outreach Telephone from 12/11/2021 in [place] Patient Outreach Telephone from 11/12/2021 in [place] Coordination  SDOH Interventions        Food Insecurity Interventions Intervention Not Indicated -- -- -- -- Intervention Not Indicated  Housing Interventions -- -- -- -- Intervention Not Indicated --  Transportation Interventions -- -- -- Intervention Not Indicated -- --   Utilities Interventions -- -- Intervention Not Indicated -- -- --  Alcohol Usage Interventions Intervention Not Indicated (Score <7) -- -- -- -- --  Financial Strain Interventions -- -- -- -- Intervention Not Indicated --  Physical Activity Interventions -- Other (Comments)  [patient not physically able to] -- -- -- --  Stress Interventions -- -- -- Intervention Not Indicated -- --  Social Connections Interventions -- -- Intervention Not Indicated -- -- --     Care Plan  Allergies  Allergen Reactions   Metformin And Related Nausea Only   Other     Clonidine patch causes skin irritation    Hydrocodone Itching    Patient able to tolerate when taken with Benadryl.   Oxycodone-Acetaminophen Itching    Patient able to tolerate when taken with Benadryl.   Medications Reviewed Today     Reviewed by Gayla Medicus, RN (Registered Nurse) on 05/01/22 at 1250  Med List Status: <None>   Medication Order Taking? Sig Documenting Provider Last Dose Status Informant  Accu-Chek Softclix Lancets lancets [id number] No 3 (three) times daily. [provider] Taking Active Self  acetaminophen (TYLENOL) 500 MG tablet [id number] No Take 1,000 mg by mouth every 6 (six) hours as needed for moderate pain or headache. [provider] Taking Active Self  ACIDOPHILUS LACTOBACILLUS PO [id number] No Take 1 Capful by mouth daily. [provider] Taking Active Self  allopurinol (ZYLOPRIM) 100 MG tablet [id number] No Take 100 mg by mouth in the morning. [provider] Taking Active Self  amLODipine (NORVASC) 10 MG tablet [id number] No Take 10 mg by mouth daily. [provider] Taking Active   aspirin EC 81 MG tablet [id number] No Take 81 mg by mouth in the morning. Swallow whole. [provider] Taking Active Self  Med Note Maria Hudson   Mon Apr 14, 2021  9:22 PM)    atorvastatin (LIPITOR) 40 MG tablet [id number] No Take 1 tablet (40 mg total) by mouth  every evening. Maria Latch, MD Taking Active Self  buPROPion [facility]) 100 MG tablet [id number] No Take 100 mg by mouth 2 (two) times daily. [provider] Taking Active Self  carvedilol (COREG) 25 MG tablet [id number] No Take 1 tablet (25 mg total) by mouth 2 (two) times daily with a meal. [name], Maria Picket, MD Taking Active Self  cholecalciferol (VITAMIN D3) 25 MCG (1000 UNIT) tablet [id number] No Take 2,000 Units by mouth daily. [provider] Taking Active Self  cloNIDine (CATAPRES) 0.3 MG tablet [id number] No Take 0.3 mg by mouth 3 (three) times daily. [provider] Taking Active Self           Med Note [facility] [place]   Tue Mar 03, 2022  9:23 AM)    dapagliflozin propanediol (FARXIGA) 10 MG TABS tablet [id number]  Take 1 tablet (10 mg total) by mouth daily before breakfast. Maria Bihari, FNP  Active   diclofenac Sodium (VOLTAREN) 1 % GEL [id number] No Apply 2 g topically 4 (four) times daily as needed (chest pain.). [provider] Taking Active Self  ferrous sulfate 325 (65 FE) MG tablet [id number] No Take 325 mg by mouth 2 (two) times daily. [provider] Taking Active Self  fluticasone (FLONASE) 50 MCG/ACT nasal spray [id number] No Place 1 spray into both nostrils daily as needed for allergies or rhinitis. [provider] Taking Active Self  hydrALAZINE (APRESOLINE) 100 MG tablet [id number] No Take 100 mg by mouth 3 (three) times daily. [provider] Taking Active Self  insulin glargine (LANTUS) 100 unit/mL SOPN [id number] No Inject 10 Units into the skin at bedtime. [provider] Taking Active Self           Med Note [facility] [place]   Tue Aug 19, 2021  9:37 AM)    isosorbide mononitrate (IMDUR) 60 MG 24 hr tablet [id number] No Take 1.5 tablets (90 mg total) by mouth daily. Maria Hudson, Maria Bo, FNP Taking Active Self  linaclotide Rolan Lipa) 145 MCG CAPS capsule [id number]  Take 1 capsule (145 mcg  total) by mouth daily before breakfast. Zehr, Maria Emperor, PA-C  Active   loratadine (CLARITIN) 10 MG tablet [id number] No Take 10 mg by mouth in the morning. [provider] Taking Active Self  Multiple Vitamin (MULTIVITAMIN ADULT) TABS [id number] No Take 1 tablet by mouth in the morning. [provider] Taking Active Self  nitroGLYCERIN (NITROSTAT) 0.4 MG SL tablet [id number] No Place 1 tablet (0.4 mg total) under the tongue every 5 (five) minutes as needed for chest pain. [name], Maria Picket, MD Taking Active Self           Med Note Gilman Buttner, Juanita Laster   Tue Aug 19, 2021  9:35 AM)    Cira Servant FLEXPEN 100 UNIT/ML FlexPen [id number] No Inject 3 Units into the skin 3 (three) times daily with meals. [provider] Taking Active Self           Med Note (ADSIDE, Tiney Rouge   Fri Apr 17, 2022  8:30 AM)    Omega-3 Fatty Acids (FISH OIL) 1200 MG CAPS [id number] No Take 1,200 mg by mouth in the morning. [provider] Taking Active Self  Polyethylene Glycol 3350 (MIRALAX PO) [id number] No Take by mouth daily as needed. [provider] Taking Active  potassium chloride SA (KLOR-CON M) 20 MEQ tablet [id number] No Take 20 mEq by mouth daily. [provider] Taking Active            Med Note Jodi Mourning, Toney Reil Apr 13, 2022  9:26 AM) Dewaine Conger ~ 3 times weekly, on days she takes torsemide 40 mg   pregabalin (LYRICA) 25 MG capsule [id number] No Take 25 mg by mouth 2 (two) times daily. [provider] Taking Active Self  Semaglutide, 1 MG/DOSE, (OZEMPIC, 1 MG/DOSE,) 4 MG/3ML SOPN [id number] No Inject 1 mg into the skin once a week. Buford Dresser, MD Taking Active   sertraline (ZOLOFT) 50 MG tablet [id number] No Take 50 mg by mouth in the morning. [provider] Taking Active Self  spironolactone (ALDACTONE) 25 MG tablet [id number] No Take 25 mg by mouth daily. [provider] Taking Active   torsemide (DEMADEX) 20 MG tablet  [id number]  Take 1 tablet (20 mg total) by mouth daily as needed. Vilonia, Avis, [place]  Active   traZODone (DESYREL) 50 MG tablet [id number] No Take 50 mg by mouth at bedtime. [provider] Taking Active Self  TURMERIC CURCUMIN PO [id number] No Take by mouth. [provider] Taking Active            Patient Active Problem List   Diagnosis Date Noted   Chronic constipation 03/31/2022   Pain due to onychomycosis of toenails of both feet 12/05/2021   Pure hypercholesterolemia 05/27/2021   Chronic diastolic CHF (congestive heart failure) ([place]) 04/29/2021   Hypertensive urgency 04/14/2021   Acute on chronic diastolic CHF (congestive heart failure) ([place]) 04/14/2021   Chest pain 03/05/2021   CAD (coronary artery disease) 03/05/2021   T2DM (type 2 diabetes mellitus) ([place]) 03/05/2021   CKD (chronic kidney disease) stage 4, GFR 15-29 ml/min ([place]) 03/05/2021   Class 3 obesity ([place]) 03/05/2021   CVA (cerebral vascular accident) ([place]) 03/05/2021   Acute on chronic diastolic heart failure ([place]) 03/05/2021   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   AKI (acute kidney injury) ([place]) 03/05/2021   Vitreous hemorrhage of left eye ([place]) 11/14/2020   Orthopnea 08/06/2020   Vision loss of right eye 08/06/2020   Excessive daytime sleepiness 01/11/2020   Iron deficiency anemia 12/14/2019   Vitreous hemorrhage, right ([place]) 12/04/2019   Diabetic neuropathy ([place]) 07/26/2019   Proliferative diabetic retinopathy of both 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 ([place]) 02/03/2019   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   Conductive hearing loss, bilateral 03/03/2018   Chronic bilateral low back pain without sciatica 08/21/2015   Chronic kidney disease (CKD) stage G3b/A3, moderately decreased glomerular filtration rate (GFR) between 30-44 mL/min/1.73 square meter and albuminuria creatinine ratio greater than 300 mg/g ([place]) 04/30/2015   Type 2 diabetes mellitus with diabetic  polyneuropathy, with long-term current use of insulin ([place]) 02/15/2015   Advance directive on file 05/17/2014   Refusal of blood transfusions as patient is Jehovah's Witness 05/17/2014   OSA treated with BiPAP 02/23/2014   Coronary artery disease 07/07/2013   Severe obesity (BMI >= 40) ([place]) 05/18/2013   Neuropathy 88/87/5797   Diastolic heart failure ([place]) 10/16/2012   Essential hypertension 10/16/2012   Conditions to be addressed/monitored per PCP order:  Chronic healthcare management issues, neuropathy, retinopathy, HTN, CKD, DM, CAD, OSA, chronic pain  Care Plan : RN Care Manager Plan Of Care  Updates made by Gayla Medicus, RN since 05/01/2022 12:00 AM     Problem: Knowledge Deficit and  Care Coordination Needs Related to Management of HTN, HF, Type 2 DM   Priority: High     Long-Range Goal: Development of Plan of Care to Address Knowledge Deficits and Care Coordination Needs related to  management of CHF, CAD, HTN, HLD, DMII, CKD Stage 4, and Depression   Start Date: 05/06/2021  Expected End Date: 08/01/2022  Priority: High  Note:   Current Barriers:  Knowledge Deficits related to plan of care for management of CHF, CAD, HTN, HLD, DMII, CKD Stage 4, and Depression 05/01/22:  Bllod pressures and blood sugars WNL-A1C = 5.2 last month per patient.  Main issues today are constipation and left eye cataract.  Discussed constipation management.  Has appt for eye surgery 05/27/22.  Patient has occasional dizziness-managed per patient. Weight now 204   RNCM Clinical Goal(s):  Patient will demonstrate ongoing adherence to prescribed treatment plan for CHF, CAD, HTN, HLD, DMII, CKD Stage 4, and Depression as evidenced by patient report of improved health and quality of life and no unplanned ED or unplanned hospital admissions continue to work with RN Care Manager and/or Social Worker to address care management and care coordination needs related to CHF, CAD, HTN, HLD, DMII, CKD Stage 4, and  Depression as evidenced by adherence to CM Team Scheduled appointments      Work with [facility] LCSW and psychiatrist to treat depression-patient states she does not feel she needs right now.  Interventions: Inter-disciplinary care team collaboration (see longitudinal plan of care) Evaluation of current treatment plan related to  self management and patient's adherence to plan as established by provider Mailed summary of benefits for her [place] Medicaid plan to her home address Collaborated with Care Guide for dental resources-completed. Care Guide referral for dental resources Collaborated with BSW for assistance with [facility] appt.-completed. BSW referral for appt assistance. Collaborated with Pharmacy regarding  uncontrolled HTN Pharmacy referral for uncontrolled HTN-completed.   Heart Failure Interventions:  (Status: Goal on Track (progressing): YES.)  Long Term Goal - patient states her fluid retention has significantly improved,  says she cannot tolerate the compression stockings because they are too tight and make her legs hurt Assessed patient's heart failure management strategies  Diabetes:  (Status: Goal on Track (progressing): YES.) Long Term Goal   Lab Results  Component Value Date   HGBA1C 6.7 (H) 04/29/2021   Hgn A1C=6.3 on 12/26/21 Hgb A1C=5.2 in October per patient Assessed patient's understanding of A1C goal: <7% Reviewed medications with patient and discussed importance of medication adherence;        Counseled on importance of regular laboratory monitoring as prescribed;        Discussed plans with patient for ongoing care management follow up and provided patient with direct contact information for care management team;      Advised patient, providing education and rationale, to check cbg three times daily and record        call provider for findings outside established parameters;       Review of patient status, including review of consultants reports,  relevant laboratory and other test results, and medications completed;       Discussed addition of Ozempic to medication regimen , reviewed mechanism of action and proven cardiovascular benefits  Hypertension and CKD stage 4 : (Status: Goal on Track (progressing): YES.) Long Term Goal-  has been placed on kidney transplant waiting list, fistu;a placed 12/09/21,ready for use 03/11/22. Last practice recorded BP readings:   BP 04/01/22-145/87 BP 05/01/22-141/70 BP Readings from  Last 3 Encounters:  09/30/21 120/70  09/12/21 (!) 107/58  09/08/21 (!) 144/71     Most recent eGFR/CrCl:    No components found for: CRCL Lab Results  Component Value Date   NA 136 08/19/2021   K 3.9 08/19/2021   CREATININE 2.51 (H) 08/19/2021   EGFR 19 (L) 03/13/2021   GFRNONAA 22 (L) 08/19/2021   GLUCOSE 122 (H) 08/19/2021    Evaluation of current treatment plan related to hypertension self management and patient's adherence to plan as established by provider;   Reviewed medications with patient and discussed importance of compliance;  Discussed plans with patient for ongoing care management follow up and provided patient with direct contact information for care management team; Advised patient, providing education and rationale, to monitor blood pressure daily and record, calling PCP for findings outside established parameters;  Reviewed scheduled/upcoming provider appointments  Patient with labile blood pressures, medication adjusted, patient ot f/u with nephrologist.  Weight Loss Interventions:  (Status:  New goal.) Long Term Goal--patient has started Ozempic Discussed indications, mechanism of action, common side effects and CV benefits  of GLP1 Ozempic Assessed tolerance of Ozempic  Interdisciplinary Collaboration:  (Status: Goal on Track (progressing): YES.) Long Term Goal  Collaborated with BSW to initiate plan of care to address needs related to Housing barriers, Inability to perform ADL's  independently, and Inability to perform IADL's independently in patient with CHF, CAD, HTN, HLD, DM, CKD Stage 4, and Depression Assessed status of patient's involvement with behavioral health counselor Collaboration with pharmacist for medication review, discussion of ? Medication side effects causing chronic constipation and arranging for patient to received home delivery of medications in blister packs Collaboration with Maria Ebbs, MD regarding development and update of comprehensive plan of care as evidenced by provider attestation and co-signature Inter-disciplinary care team collaboration (see longitudinal plan of care)  Patient Goals/Self-Care Activities: Take medications as prescribed   Attend all scheduled provider appointments Call pharmacy for medication refills 3-7 days in advance of running out of medications Attend church or other social activities Perform all self care activities independently  Call provider office for new concerns or questions  Work with the social worker to address care coordination needs and will continue to work with the clinical team to address health care and disease management related needs call office if I gain more than 2 pounds in one day or 5 pounds in one week keep legs up while sitting track weight in diary use salt in moderation watch for swelling in feet, ankles and legs every day weigh myself daily know when to call the doctorfor fluid weight gain or consistently abnormal BP readings at home track symptoms and what helps feel better or worse check blood sugar at prescribed times: three times daily enter blood sugar readings and medication or insulin into daily log take the blood sugar log to all doctor visits take the blood sugar meter to all doctor visits eat fish at least once per week fill half of plate with vegetables manage portion size check blood pressure daily write blood pressure results in a log or diary learn about high  blood pressure take blood pressure log to all doctor appointments call doctor for signs and symptoms of high blood pressure keep all doctor appointments take medications for blood pressure exactly as prescribed Limit fluid intake to 1 liter per day Collaborate with managed Medicaid team of BSW and pharmacist to address identified needs Work with cardiology pharmacist to assist with BP stabilization Work with  [place] LCSW and psychiatrist to treat depression Review summary of [employer] benefits mailed to home address and call to enroll in program that will benefit your chronic health issues Continue to use [facility] Management calendar to record weight , BP and CBGs so readings are all in one place Schedule dental appt.   Long-Range Goal: Establish Plan of Care for Chronic Disease Management Needs   Priority: High  Note:   Timeframe:  Long-Range Goal Priority:  High Start Date:     11/12/21                        Expected End Date:  ongoing                     Follow Up Date: 06/03/22   - schedule appointment for vaccines needed due to my age or health - schedule recommended health tests (blood work, mammogram, colonoscopy, pap test) - schedule and keep appointment for annual check-up    Why is this important?   Screening tests can find diseases early when they are easier to treat.  Your doctor or nurse will talk with you about which tests are important for you.  Getting shots for common diseases like the flu and shingles will help prevent them.  05/01/22:  patient recently seen and evaluated by CARDS and eye provider, has appt with Nephrology next month   Follow Up:  Patient agrees to Care Plan and Follow-up.  Plan: The Managed Medicaid care management team will reach out to the patient again over the next 30 business  days. and The  Patient has been provided with contact information for the Managed Medicaid care management team and has been advised to call with any  health related questions or concerns.  Date/time of next scheduled RN care management/care coordination outreach:  06/03/22 at 0900.

## 2022-05-01 NOTE — Patient Instructions (Signed)
Hi Ms. Ensz for speaking with me today and updating me on things-I hope your surgery goes well.  Have a nice afternoon and weekend.  Ms. Lackman was given information about Medicaid Managed Care team care coordination services as a part of their [facility] Medicaid benefit. Mimi Debellis verbally consented to engagement with the [facility] Managed Care team.   If you are experiencing a medical emergency, please call 911 or report to your local emergency department or urgent care.   If you have a non-emergency medical problem during routine business hours, please contact your provider's office and ask to speak with a nurse.   For questions related to your [facility] health plan, please call: [phone] or go here:[email or web address]  If you would like to schedule transportation through your [facility] plan, please call the following number at least 2 days in advance of your appointment: [phone].  You can also use the MTM portal or MTM mobile app to manage your rides. For the portal, please go to [email or web address].  Call the [place] at [phone], at any time, 24 hours a day, 7 days a week. If you are in danger or need immediate medical attention call 911.  If you would like help to quit smoking, call 1-800-QUIT-NOW (443)809-8325) OR Espaol: 1-855-Djelo-Ya (4-536-468-0321) o para ms informacin haga clic aqu or Text READY to 200-400 to register via text  Ms. Shenefield - following are the goals we discussed in your visit today:   Goals Addressed    Timeframe:  Long-Range Goal Priority:  High Start Date:     11/12/21                        Expected End Date:  ongoing                     Follow Up Date: 06/03/22   - schedule appointment for vaccines needed due to my age or health - schedule recommended health tests (blood work, mammogram, colonoscopy, pap test) - schedule and keep appointment for annual check-up    Why is this important?    Screening tests can find diseases early when they are easier to treat.  Your doctor or nurse will talk with you about which tests are important for you.  Getting shots for common diseases like the flu and shingles will help prevent them.  05/01/22:  patient recently seen and evaluated by CARDS and eye provider, has appt with Nephrology next month  Patient verbalizes understanding of instructions and care plan provided today and agrees to view in [place]. Active MyChart status and patient understanding of how to access instructions and care plan via MyChart confirmed with patient.     The Managed Medicaid care management team will reach out to the patient again over the next 30 business  days.  The  Patient has been provided with contact information for the Managed Medicaid care management team and has been advised to call with any health related questions or concerns.   Aida Raider RN, BSN [place] Management Coordinator - Managed Medicaid High Risk [phone]   Following is a copy of your plan of care:  Care Plan : Rachel  Updates made by Gayla Medicus, RN since 05/01/2022 12:00 AM     Problem: Knowledge Deficit and Care Coordination Needs Related to Management of HTN, HF, Type 2 DM   Priority: High  Long-Range Goal: Development of Plan of Care to Address Knowledge Deficits and Care Coordination Needs related to  management of CHF, CAD, HTN, HLD, DMII, CKD Stage 4, and Depression   Start Date: 05/06/2021  Expected End Date: 08/01/2022  Priority: High  Note:   Current Barriers:  Knowledge Deficits related to plan of care for management of CHF, CAD, HTN, HLD, DMII, CKD Stage 4, and Depression 05/01/22:  Bllod pressures and blood sugars WNL-A1C = 5.2 last month per patient.  Main issues today are constipation and left eye cataract.  Discussed constipation management.  Has appt for eye surgery 05/27/22.  Patient has  occasional dizziness-managed per patient. Weight now 204   RNCM Clinical Goal(s):  Patient will demonstrate ongoing adherence to prescribed treatment plan for CHF, CAD, HTN, HLD, DMII, CKD Stage 4, and Depression as evidenced by patient report of improved health and quality of life and no unplanned ED or unplanned hospital admissions continue to work with RN Care Manager and/or Social Worker to address care management and care coordination needs related to CHF, CAD, HTN, HLD, DMII, CKD Stage 4, and Depression as evidenced by adherence to CM Team Scheduled appointments      Work with [facility] LCSW and psychiatrist to treat depression-patient states she does not feel she needs right now.  Interventions: Inter-disciplinary care team collaboration (see longitudinal plan of care) Evaluation of current treatment plan related to  self management and patient's adherence to plan as established by provider Mailed summary of benefits for her [place] Medicaid plan to her home address Collaborated with Care Guide for dental resources-completed. Care Guide referral for dental resources Collaborated with BSW for assistance with [facility] appt.-completed. BSW referral for appt assistance. Collaborated with Pharmacy regarding  uncontrolled HTN Pharmacy referral for uncontrolled HTN-completed.   Heart Failure Interventions:  (Status: Goal on Track (progressing): YES.)  Long Term Goal - patient states her fluid retention has significantly improved,  says she cannot tolerate the compression stockings because they are too tight and make her legs hurt Assessed patient's heart failure management strategies  Diabetes:  (Status: Goal on Track (progressing): YES.) Long Term Goal   Lab Results  Component Value Date   HGBA1C 6.7 (H) 04/29/2021   Hgn A1C=6.3 on 12/26/21 Hgb A1C=5.2 in October per patient Assessed patient's understanding of A1C goal: <7% Reviewed medications with patient and discussed  importance of medication adherence;        Counseled on importance of regular laboratory monitoring as prescribed;        Discussed plans with patient for ongoing care management follow up and provided patient with direct contact information for care management team;      Advised patient, providing education and rationale, to check cbg three times daily and record        call provider for findings outside established parameters;       Review of patient status, including review of consultants reports, relevant laboratory and other test results, and medications completed;       Discussed addition of Ozempic to medication regimen , reviewed mechanism of action and proven cardiovascular benefits  Hypertension and CKD stage 4 : (Status: Goal on Track (progressing): YES.) Long Term Goal-  has been placed on kidney transplant waiting list, fistu;a placed 12/09/21,ready for use 03/11/22. Last practice recorded BP readings:   BP 04/01/22-145/87 BP 05/01/22-141/70 BP Readings from Last 3 Encounters:  09/30/21 120/70  09/12/21 (!) 107/58  09/08/21 (!) 144/71     Most recent  eGFR/CrCl:    No components found for: CRCL Lab Results  Component Value Date   NA 136 08/19/2021   K 3.9 08/19/2021   CREATININE 2.51 (H) 08/19/2021   EGFR 19 (L) 03/13/2021   GFRNONAA 22 (L) 08/19/2021   GLUCOSE 122 (H) 08/19/2021    Evaluation of current treatment plan related to hypertension self management and patient's adherence to plan as established by provider;   Reviewed medications with patient and discussed importance of compliance;  Discussed plans with patient for ongoing care management follow up and provided patient with direct contact information for care management team; Advised patient, providing education and rationale, to monitor blood pressure daily and record, calling PCP for findings outside established parameters;  Reviewed scheduled/upcoming provider appointments  Patient with labile blood pressures,  medication adjusted, patient ot f/u with nephrologist.  Weight Loss Interventions:  (Status:  New goal.) Long Term Goal--patient has started Ozempic Discussed indications, mechanism of action, common side effects and CV benefits  of GLP1 Ozempic Assessed tolerance of Ozempic  Interdisciplinary Collaboration:  (Status: Goal on Track (progressing): YES.) Long Term Goal  Collaborated with BSW to initiate plan of care to address needs related to Housing barriers, Inability to perform ADL's independently, and Inability to perform IADL's independently in patient with CHF, CAD, HTN, HLD, DM, CKD Stage 4, and Depression Assessed status of patient's involvement with behavioral health counselor Collaboration with pharmacist for medication review, discussion of ? Medication side effects causing chronic constipation and arranging for patient to received home delivery of medications in blister packs Collaboration with Nolene Ebbs, MD regarding development and update of comprehensive plan of care as evidenced by provider attestation and co-signature Inter-disciplinary care team collaboration (see longitudinal plan of care)  Patient Goals/Self-Care Activities: Take medications as prescribed   Attend all scheduled provider appointments Call pharmacy for medication refills 3-7 days in advance of running out of medications Attend church or other social activities Perform all self care activities independently  Call provider office for new concerns or questions  Work with the social worker to address care coordination needs and will continue to work with the clinical team to address health care and disease management related needs call office if I gain more than 2 pounds in one day or 5 pounds in one week keep legs up while sitting track weight in diary use salt in moderation watch for swelling in feet, ankles and legs every day weigh myself daily know when to call the doctorfor fluid weight gain or  consistently abnormal BP readings at home track symptoms and what helps feel better or worse check blood sugar at prescribed times: three times daily enter blood sugar readings and medication or insulin into daily log take the blood sugar log to all doctor visits take the blood sugar meter to all doctor visits eat fish at least once per week fill half of plate with vegetables manage portion size check blood pressure daily write blood pressure results in a log or diary learn about high blood pressure take blood pressure log to all doctor appointments call doctor for signs and symptoms of high blood pressure keep all doctor appointments take medications for blood pressure exactly as prescribed Limit fluid intake to 1 liter per day Collaborate with managed Medicaid team of BSW and pharmacist to address identified needs Work with cardiology pharmacist to assist with BP stabilization Work with [facility] LCSW and psychiatrist to treat depression Review summary of Well[employer] benefits mailed to home address and call to enroll  in program that will benefit your chronic health issues Continue to use [place] Management calendar to record weight , BP and CBGs so readings are all in one place Schedule dental appt.

## 2022-05-04 ENCOUNTER — Telehealth (HOSPITAL_BASED_OUTPATIENT_CLINIC_OR_DEPARTMENT_OTHER): Payer: Self-pay | Admitting: *Deleted

## 2022-05-04 ENCOUNTER — Encounter (HOSPITAL_COMMUNITY): Payer: Medicaid Other

## 2022-05-04 NOTE — Telephone Encounter (Signed)
     Primary Cardiologist: Buford Dresser, MD  Chart reviewed as part of pre-operative protocol coverage. Given past medical history and time since last visit, based on ACC/AHA guidelines, Maria Hudson would be at acceptable risk for the planned procedure without further cardiovascular testing.   Her RCRI is a class IV risk, 11% risk of major cardiac event.  I will route this recommendation to the requesting party via Epic fax function and remove from pre-op pool.  Please call with questions.  Jossie Ng. Alto Gandolfo NP-C     05/04/2022, 2:08 PM [facility] [address] Office [phone] Fax [phone]

## 2022-05-04 NOTE — Telephone Encounter (Signed)
   Pre-operative Risk Assessment    Patient Name: Maria Hudson  DOB: [birth year] MRN: [id number]      Request for Surgical Clearance    Procedure:   Eye Surgery (Vitrectomy, panretinal photocoagulation, and cataract extraction)  Date of Surgery:  Clearance 05/27/22                                 Surgeon:  Ernst Breach, M.D. Surgeon's Group or Practice Name:  [facility] Phone number:  [phone] Fax number:  [phone]   Type of Clearance Requested:   - Medical    Type of Anesthesia:  MAC   Additional requests/questions:   Hold Aspirin for 7 days prior to surgery  Rocco Pauls   05/04/2022, 1:20 PM

## 2022-05-06 NOTE — Telephone Encounter (Signed)
[facility] is calling back stating they have not received a response and would like clearance faxed to:  [phone]

## 2022-05-06 NOTE — Telephone Encounter (Signed)
EPIC faxed the clearance to the requesting office

## 2022-05-12 ENCOUNTER — Other Ambulatory Visit (HOSPITAL_COMMUNITY): Payer: Self-pay

## 2022-05-12 ENCOUNTER — Other Ambulatory Visit: Payer: Self-pay | Admitting: Cardiology

## 2022-05-12 DIAGNOSIS — E113513 Type 2 diabetes mellitus with proliferative diabetic retinopathy with macular edema, bilateral: Secondary | ICD-10-CM

## 2022-05-12 DIAGNOSIS — E113593 Type 2 diabetes mellitus with proliferative diabetic retinopathy without macular edema, bilateral: Secondary | ICD-10-CM | POA: Diagnosis not present

## 2022-05-12 DIAGNOSIS — H25013 Cortical age-related cataract, bilateral: Secondary | ICD-10-CM | POA: Diagnosis not present

## 2022-05-12 DIAGNOSIS — H2513 Age-related nuclear cataract, bilateral: Secondary | ICD-10-CM | POA: Diagnosis not present

## 2022-05-12 DIAGNOSIS — H4312 Vitreous hemorrhage, left eye: Secondary | ICD-10-CM | POA: Diagnosis not present

## 2022-05-12 DIAGNOSIS — H18413 Arcus senilis, bilateral: Secondary | ICD-10-CM | POA: Diagnosis not present

## 2022-05-12 DIAGNOSIS — H2512 Age-related nuclear cataract, left eye: Secondary | ICD-10-CM | POA: Diagnosis not present

## 2022-05-12 DIAGNOSIS — Z794 Long term (current) use of insulin: Secondary | ICD-10-CM

## 2022-05-12 MED ORDER — OZEMPIC (1 MG/DOSE) 4 MG/3ML ~~LOC~~ SOPN
1.0000 mg | PEN_INJECTOR | SUBCUTANEOUS | 2 refills | Status: DC
  Filled 2022-05-12: qty 3, 28d supply, fill #0
  Filled 2022-06-05: qty 3, 28d supply, fill #1
  Filled 2022-07-03: qty 3, 28d supply, fill #2

## 2022-05-12 NOTE — Telephone Encounter (Signed)
Please review for refill. Thank you! 

## 2022-05-15 DIAGNOSIS — Z419 Encounter for procedure for purposes other than remedying health state, unspecified: Secondary | ICD-10-CM | POA: Diagnosis not present

## 2022-05-15 DIAGNOSIS — G4733 Obstructive sleep apnea (adult) (pediatric): Secondary | ICD-10-CM | POA: Diagnosis not present

## 2022-05-21 ENCOUNTER — Other Ambulatory Visit: Payer: Self-pay | Admitting: Cardiovascular Disease

## 2022-05-21 DIAGNOSIS — E1122 Type 2 diabetes mellitus with diabetic chronic kidney disease: Secondary | ICD-10-CM | POA: Diagnosis not present

## 2022-05-21 DIAGNOSIS — N184 Chronic kidney disease, stage 4 (severe): Secondary | ICD-10-CM | POA: Diagnosis not present

## 2022-05-21 DIAGNOSIS — N2581 Secondary hyperparathyroidism of renal origin: Secondary | ICD-10-CM | POA: Diagnosis not present

## 2022-05-21 DIAGNOSIS — I5032 Chronic diastolic (congestive) heart failure: Secondary | ICD-10-CM | POA: Diagnosis not present

## 2022-05-21 DIAGNOSIS — I129 Hypertensive chronic kidney disease with stage 1 through stage 4 chronic kidney disease, or unspecified chronic kidney disease: Secondary | ICD-10-CM | POA: Diagnosis not present

## 2022-05-22 NOTE — Telephone Encounter (Signed)
Rx request sent to pharmacy.  

## 2022-05-25 DIAGNOSIS — Z794 Long term (current) use of insulin: Secondary | ICD-10-CM | POA: Diagnosis not present

## 2022-05-25 DIAGNOSIS — E1121 Type 2 diabetes mellitus with diabetic nephropathy: Secondary | ICD-10-CM | POA: Diagnosis not present

## 2022-05-27 DIAGNOSIS — E113592 Type 2 diabetes mellitus with proliferative diabetic retinopathy without macular edema, left eye: Secondary | ICD-10-CM | POA: Diagnosis not present

## 2022-05-27 DIAGNOSIS — H4312 Vitreous hemorrhage, left eye: Secondary | ICD-10-CM | POA: Diagnosis not present

## 2022-05-27 DIAGNOSIS — H2512 Age-related nuclear cataract, left eye: Secondary | ICD-10-CM | POA: Diagnosis not present

## 2022-05-29 DIAGNOSIS — I509 Heart failure, unspecified: Secondary | ICD-10-CM | POA: Diagnosis not present

## 2022-05-29 DIAGNOSIS — E1122 Type 2 diabetes mellitus with diabetic chronic kidney disease: Secondary | ICD-10-CM | POA: Diagnosis not present

## 2022-05-29 DIAGNOSIS — I10 Essential (primary) hypertension: Secondary | ICD-10-CM | POA: Diagnosis not present

## 2022-05-29 DIAGNOSIS — K5904 Chronic idiopathic constipation: Secondary | ICD-10-CM | POA: Diagnosis not present

## 2022-06-01 ENCOUNTER — Other Ambulatory Visit: Payer: Medicaid Other | Admitting: Pharmacist

## 2022-06-01 NOTE — Patient Instructions (Signed)
Ms. Nuccio,   It was great talking to you today!  Take your home cuff with you the next time you are seen in person to compare to the office based blood pressure cuffs.   Check your blood pressure once daily, and any time you have concerning symptoms like headache, chest pain, dizziness, shortness of breath, or vision changes.   Our goal is less than 130/80.  To appropriately check your blood pressure, make sure you do the following:  1) Avoid caffeine, exercise, or tobacco products for 30 minutes before checking. Empty your bladder. 2) Sit with your back supported in a flat-backed chair. Rest your arm on something flat (arm of the chair, table, etc). 3) Sit still with your feet flat on the floor, resting, for at least 5 minutes.  4) Check your blood pressure. Take 1-2 readings.  5) Write down these readings and bring with you to any provider appointments.  Bring your home blood pressure machine with you to a provider's office for accuracy comparison at least once a year.   Make sure you take your blood pressure medications before you come to any office visit, even if you were asked to fast for labs.  Take care!  Catie Hedwig Morton, PharmD, [facility] [phone]

## 2022-06-01 NOTE — Progress Notes (Signed)
06/01/2022 Name: Maria Hudson MRN: [id number] DOB: 1961/04/18  Chief Complaint  Patient presents with   Medication Management   Diabetes    Maria Hudson is a 61 y.o. year old female who presented for a telephone visit.   They were referred to the pharmacist by their Case Management Team  for assistance in managing hypertension.   Patient is participating in a Managed Medicaid Plan:  Yes  Subjective:  Care Team: Primary Care Provider: Nolene Ebbs, MD Nephrology: Harrie Jeans; blood work on 21st, appointment in ~ 3 months   Medication Access/Adherence  Current Pharmacy:  [id number] - [place], [place] [address] [place] [address] Phone: [phone] Fax: [phone]  Patient reports affordability concerns with their medications: No  Patient reports access/transportation concerns to their pharmacy: No  Patient reports adherence concerns with their medications:  No    Diabetes:  Current medications: Ozempic 1 mg weekly, Farxiga 10 mg daily, Lantus 5 units daily; Novolog 3 units with meals with occasional elevation with sliding scale  Current glucose readings: fastings: 90-140s;   Patient denies hypoglycemic s/sx including dizziness, shakiness, sweating. Patient denies hyperglycemic symptoms including polyuria, polydipsia, polyphagia, nocturia, neuropathy, blurred vision.  Current meal patterns: avoid salt when she cooks, but did have some days that she ate her daughter's cooking; as we discussed, she does sometimes have canned pasta sauce ([place]o); avoids canned vegetables, avoids frozen/preserved foods   Hyperlipidemia/ASCVD Risk Reduction  Current lipid lowering medications: atorvastatin 40 mg daily   Antiplatelet regimen: aspirin 81 mg   Heart Failure:  Current medications:  ACEi/ARB/ARNI: limited by renal function SGLT2i: Farxiga 10 mg  Beta blocker: carvedilol 25 mg daily   Mineralocorticoid Receptor Antagonist: spironolactone 25 mg every evening Diuretic regimen: torsemide 40 mg + potassium 20 mEq 3 days weekly   Current home blood pressure readings: using automatic upper arm cuff from [place]  12/9 -   145/71 12/10 - 153/75 12/11 - 179/80 12/12 - 191/91 12/13 - 155/79 12/14 - 140/72 12/15 - 129/75  12/16 - 158/76 12/17 - 143/72  Reports 120/70 at Dr. Santiago Bur office last week   Patient denies volume overload signs or symptoms including shortness of breath, lower extremity edema, increased use of pillows at night. Denies any headaches, new chest pain,  Health Maintenance  Health Maintenance Due  Topic Date Due   OPHTHALMOLOGY EXAM  Never done   Hepatitis C Screening  Never done   DTaP/Tdap/Td (1 - Tdap) Never done   Zoster Vaccines- Shingrix (1 of 2) Never done   PAP SMEAR-Modifier  Never done   MAMMOGRAM  Never done   HEMOGLOBIN A1C  10/27/2021   INFLUENZA VACCINE  01/13/2022   COVID-19 Vaccine (6 - 2023-24 season) 02/13/2022     Objective: Lab Results  Component Value Date   HGBA1C 6.7 (H) 04/29/2021    Lab Results  Component Value Date   CREATININE 2.52 (H) 04/17/2022   BUN 67 (H) 04/17/2022   NA 139 04/17/2022   K 4.1 04/17/2022   CL 106 04/17/2022   CO2 22 04/17/2022    Lab Results  Component Value Date   CHOL 156 09/25/2021   HDL 58 09/25/2021   LDLCALC 78 09/25/2021   TRIG 109 09/25/2021   CHOLHDL 2.7 09/25/2021    Medications Reviewed Today     Reviewed by Gayla Medicus, RN (Registered Nurse) on 05/01/22 at 1250  Med List Status: <None>  Medication Order Taking? Sig Documenting Provider Last Dose Status Informant  Accu-Chek Softclix Lancets lancets [id number] No 3 (three) times daily. [provider] Taking Active Self  acetaminophen (TYLENOL) 500 MG tablet [id number] No Take 1,000 mg by mouth every 6 (six) hours as needed for moderate pain or headache. [provider] Taking Active Self   ACIDOPHILUS LACTOBACILLUS PO [id number] No Take 1 Capful by mouth daily. [provider] Taking Active Self  allopurinol (ZYLOPRIM) 100 MG tablet [id number] No Take 100 mg by mouth in the morning. [provider] Taking Active Self  amLODipine (NORVASC) 10 MG tablet [id number] No Take 10 mg by mouth daily. [provider] Taking Active   aspirin EC 81 MG tablet [id number] No Take 81 mg by mouth in the morning. Swallow whole. [provider] Taking Active Self           Med Note [name]   Mon Apr 14, 2021  9:22 PM)    atorvastatin (LIPITOR) 40 MG tablet [id number] No Take 1 tablet (40 mg total) by mouth every evening. Skeet Latch, MD Taking Active Self  buPROPion [facility]) 100 MG tablet [id number] No Take 100 mg by mouth 2 (two) times daily. [provider] Taking Active Self  carvedilol (COREG) 25 MG tablet [id number] No Take 1 tablet (25 mg total) by mouth 2 (two) times daily with a meal. [name], [name], MD Taking Active Self  cholecalciferol (VITAMIN D3) 25 MCG (1000 UNIT) tablet [id number] No Take 2,000 Units by mouth daily. [provider] Taking Active Self  cloNIDine (CATAPRES) 0.3 MG tablet [id number] No Take 0.3 mg by mouth 3 (three) times daily. [provider] Taking Active Self           Med Note Stanford Scotland   Tue Mar 03, 2022  9:23 AM)    dapagliflozin propanediol (FARXIGA) 10 MG TABS tablet [id number]  Take 1 tablet (10 mg total) by mouth daily before breakfast. Rafael Bihari, FNP  Active   diclofenac Sodium (VOLTAREN) 1 % GEL [id number] No Apply 2 g topically 4 (four) times daily as needed (chest pain.). [provider] Taking Active Self  ferrous sulfate 325 (65 FE) MG tablet [id number] No Take 325 mg by mouth 2 (two) times daily. [provider] Taking Active Self  fluticasone (FLONASE) 50 MCG/ACT nasal spray [id number] No Place 1 spray into both nostrils daily as needed for  allergies or rhinitis. [provider] Taking Active Self  hydrALAZINE (APRESOLINE) 100 MG tablet [id number] No Take 100 mg by mouth 3 (three) times daily. [provider] Taking Active Self  insulin glargine (LANTUS) 100 unit/mL SOPN [id number] No Inject 10 Units into the skin at bedtime. [provider] Taking Active Self           Med Note Stanford Scotland   Tue Aug 19, 2021  9:37 AM)    isosorbide mononitrate (IMDUR) 60 MG 24 hr tablet [id number] No Take 1.5 tablets (90 mg total) by mouth daily. [place], Maricela Bo, FNP Taking Active Self  linaclotide Rolan Lipa) 145 MCG CAPS capsule [id number]  Take 1 capsule (145 mcg total) by mouth daily before breakfast. Zehr, Laban Emperor, PA-C  Active   loratadine (CLARITIN) 10 MG tablet [id number] No Take 10 mg by mouth in the morning. [provider] Taking Active Self  Multiple Vitamin (MULTIVITAMIN ADULT) TABS [id number] No Take 1 tablet by mouth in the morning. [provider] Taking Active Self  nitroGLYCERIN (NITROSTAT) 0.4 MG  SL tablet [id number] No Place 1 tablet (0.4 mg total) under the tongue every 5 (five) minutes as needed for chest pain. [name], [name], MD Taking Active Self           Med Note Gilman Buttner, Juanita Laster   Tue Aug 19, 2021  9:35 AM)    Cira Servant FLEXPEN 100 UNIT/ML FlexPen [id number] No Inject 3 Units into the skin 3 (three) times daily with meals. [provider] Taking Active Self           Med Note (ADSIDE, Tiney Rouge   Fri Apr 17, 2022  8:30 AM)    Omega-3 Fatty Acids (FISH OIL) 1200 MG CAPS [id number] No Take 1,200 mg by mouth in the morning. [provider] Taking Active Self  Polyethylene Glycol 3350 (MIRALAX PO) [id number] No Take by mouth daily as needed. [provider] Taking Active   potassium chloride SA (KLOR-CON M) 20 MEQ tablet [id number] No Take 20 mEq by mouth daily. [provider] Taking Active            Med Note Jodi Mourning, Toney Reil  Apr 13, 2022  9:26 AM) Dewaine Conger ~ 3 times weekly, on days she takes torsemide 40 mg   pregabalin (LYRICA) 25 MG capsule [id number] No Take 25 mg by mouth 2 (two) times daily. [provider] Taking Active Self  Semaglutide, 1 MG/DOSE, (OZEMPIC, 1 MG/DOSE,) 4 MG/3ML SOPN [id number] No Inject 1 mg into the skin once a week. Buford Dresser, MD Taking Active   sertraline (ZOLOFT) 50 MG tablet [id number] No Take 50 mg by mouth in the morning. [provider] Taking Active Self  spironolactone (ALDACTONE) 25 MG tablet [id number] No Take 25 mg by mouth daily. [provider] Taking Active   torsemide (DEMADEX) 20 MG tablet [id number]  Take 1 tablet (20 mg total) by mouth daily as needed. [place], [place], [place]  Active   traZODone (DESYREL) 50 MG tablet [id number] No Take 50 mg by mouth at bedtime. [provider] Taking Active Self  TURMERIC CURCUMIN PO [id number] No Take by mouth. [provider] Taking Active               Assessment/Plan:   Diabetes: - Currently controlled - Recommend to continue current regimen at this time. Discussed to monitor for episodes of hypoglycemia and need to further reduce insulin doses or eliminate insulin, especially if continued weight loss from Ozempic.   - Follow up with endocrinology in 6 months as scheduled  Hyperlipidemia/ASCVD Risk Reduction: - Currently relatively well controlled. - Recommend to continue current regimen at this time  Heart Failure: - Currently appropriately managed - Reviewed appropriate blood pressure monitoring technique and reviewed goal blood pressure. Significant fluctuations in home readings. Patient notes she will take her home cuff with her to the office for validation the next time she has an in office appointment - Given report of controlled BP at last PCP office visit, recommend to continue current regimen at this time. Follow up with PCP and nephrology as scheduled.  - Discussed  being aware of salt intake in food prepared by others.   Follow Up Plan: phone call in ~ 6 weeks  Catie TJodi Mourning, PharmD, [facility] [phone]

## 2022-06-03 ENCOUNTER — Other Ambulatory Visit: Payer: Medicaid Other | Admitting: Obstetrics and Gynecology

## 2022-06-03 NOTE — Patient Outreach (Signed)
  Medicaid Managed Care   Unsuccessful Attempt Note   06/03/2022 Name: Maria Hudson MRN: [id number] DOB: [birth year]  Referred by: Nolene Ebbs, MD Reason for referral : High Risk Managed Medicaid (Unsuccessful telephone outreach)   An unsuccessful telephone outreach was attempted today. The patient was referred to the case management team for assistance with care management and care coordination.    Follow Up Plan: The Managed Medicaid care management team will reach out to the patient again over the next 30 business  days. and The  Patient has been provided with contact information for the Managed Medicaid care management team and has been advised to call with any health related questions or concerns.   Aida Raider RN, BSN [place]  [facility] - Managed Medicaid High Risk [phone]

## 2022-06-03 NOTE — Patient Instructions (Signed)
Hi Ms. Brick, sorry to have missed you today-hope you are well - as a part of your Medicaid benefit, you are eligible for care management and care coordination services at no cost or copay. I was unable to reach you by phone today but would be happy to help you with your health related needs. Please feel free to call me at [phone].  A member of the Managed Medicaid care management team will reach out to you again over the next 30 business days.   Aida Raider RN, BSN [place]  [facility] - Managed Medicaid High Risk [phone]

## 2022-06-04 DIAGNOSIS — N184 Chronic kidney disease, stage 4 (severe): Secondary | ICD-10-CM | POA: Diagnosis not present

## 2022-06-04 DIAGNOSIS — Z794 Long term (current) use of insulin: Secondary | ICD-10-CM | POA: Diagnosis not present

## 2022-06-04 DIAGNOSIS — E1121 Type 2 diabetes mellitus with diabetic nephropathy: Secondary | ICD-10-CM | POA: Diagnosis not present

## 2022-06-15 DIAGNOSIS — G4733 Obstructive sleep apnea (adult) (pediatric): Secondary | ICD-10-CM | POA: Diagnosis not present

## 2022-06-15 DIAGNOSIS — Z419 Encounter for procedure for purposes other than remedying health state, unspecified: Secondary | ICD-10-CM | POA: Diagnosis not present

## 2022-06-16 DIAGNOSIS — N184 Chronic kidney disease, stage 4 (severe): Secondary | ICD-10-CM | POA: Diagnosis not present

## 2022-06-16 DIAGNOSIS — N179 Acute kidney failure, unspecified: Secondary | ICD-10-CM | POA: Diagnosis not present

## 2022-07-06 ENCOUNTER — Other Ambulatory Visit (HOSPITAL_COMMUNITY): Payer: Self-pay

## 2022-07-07 ENCOUNTER — Other Ambulatory Visit: Payer: Medicaid Other | Admitting: Obstetrics and Gynecology

## 2022-07-07 ENCOUNTER — Encounter: Payer: Self-pay | Admitting: Obstetrics and Gynecology

## 2022-07-07 NOTE — Patient Outreach (Signed)
Medicaid Managed Care   Nurse Care Manager Note  07/07/2022 Name:  Maria Hudson MRN:  [id number] DOB:  10-30-1960  Maria Hudson is an 62 y.o. year old female who is a primary patient of Maria Ebbs, MD.  The [facility] Managed Care Coordination team was consulted for assistance with:    Chronic healthcare management needs, neuropathy, HTN, retinopathy, HTN, CKD, DM, CAD. OSA, chronic pain  Maria Hudson was given information about Medicaid Managed Care Coordination team services today. Maria Hudson Patient agreed to services and verbal consent obtained.  [name] for follow up visit in response to provider referral for case management and/or care coordination services.   Assessments/Interventions:  Review of past medical history, allergies, medications, health status, including review of consultants reports, laboratory and other test data, was performed as part of comprehensive evaluation and provision of chronic care management services.  SDOH (Social Determinants of Health) assessments and interventions performed: SDOH Interventions    Flowsheet Row Patient Outreach Telephone from 07/07/2022 in [place] Patient Outreach Telephone from 05/01/2022 in [place] Patient Outreach Telephone from 04/01/2022 in [place] Patient Outreach Telephone from 02/25/2022 in [place] Patient Outreach Telephone from 01/19/2022 in [place] Patient Outreach Telephone from 12/11/2021 in [place] Coordination  SDOH Interventions        Food Insecurity Interventions -- Intervention Not Indicated -- -- -- --  Housing Interventions Intervention Not Indicated -- -- -- -- Intervention Not Indicated  Transportation Interventions -- -- -- -- Intervention Not Indicated --   Utilities Interventions -- -- -- Intervention Not Indicated -- --  Alcohol Usage Interventions -- Intervention Not Indicated (Score <7) -- -- -- --  Depression Interventions/Treatment  Medication  [LCSW referral] -- -- -- -- --  Financial Strain Interventions -- -- -- -- -- Intervention Not Indicated  Physical Activity Interventions -- -- Other (Comments)  [patient not physically able to] -- -- --  Stress Interventions -- -- -- -- Intervention Not Indicated --  Social Connections Interventions -- -- -- Intervention Not Indicated -- --     Care Plan  Allergies  Allergen Reactions   Metformin And Related Nausea Only   Other     Clonidine patch causes skin irritation    Hydrocodone Itching    Patient able to tolerate when taken with Benadryl.   Oxycodone-Acetaminophen Itching    Patient able to tolerate when taken with Benadryl.   Medications Reviewed Today     Reviewed by Gayla Medicus, RN (Registered Nurse) on 07/07/22 at 9  Med List Status: <None>   Medication Order Taking? Sig Documenting Provider Last Dose Status Informant  Accu-Chek Softclix Lancets lancets [id number] No 3 (three) times daily. [provider] Taking Active Self  acetaminophen (TYLENOL) 500 MG tablet [id number] No Take 1,000 mg by mouth every 6 (six) hours as needed for moderate pain or headache. [provider] Taking Active Self  ACIDOPHILUS LACTOBACILLUS PO [id number] No Take 1 Capful by mouth daily. [provider] Taking Active Self  allopurinol (ZYLOPRIM) 100 MG tablet [id number] No Take 100 mg by mouth in the morning. [provider] Taking Active Self  amLODipine (NORVASC) 10 MG tablet [id number] No Take 10 mg by mouth daily. [provider] Taking Active   aspirin EC 81 MG tablet [id number] No Take 81 mg by mouth in the morning. Swallow whole. [provider] Taking Active Self           Med Note [name]   Mon Apr 14, 2021  9:22 PM)     atorvastatin (LIPITOR) 40 MG tablet [id number] No Take 1 tablet (40 mg total) by mouth every evening. Skeet Latch, MD Taking Active Self  buPROPion [facility]) 100 MG tablet [id number] No Take 100 mg by mouth 2 (two) times daily. [provider] Taking Active Self  carvedilol (COREG) 25 MG tablet [id number] No Take 1 tablet (25 mg total) by mouth 2 (two) times daily with a meal. [name], [name], MD Taking Expired 05/05/22 2359 Self  cholecalciferol (VITAMIN D3) 25 MCG (1000 UNIT) tablet [id number] No Take 2,000 Units by mouth daily. [provider] Taking Active Self  cloNIDine (CATAPRES) 0.3 MG tablet [id number] No Take 0.3 mg by mouth 3 (three) times daily. [provider] Taking Active Self           Med Note Stanford [place]   Tue Mar 03, 2022  9:23 AM)    dapagliflozin propanediol (FARXIGA) 10 MG TABS tablet [id number]  Take 1 tablet (10 mg total) by mouth daily before breakfast. Rafael Bihari, FNP  Active   diclofenac Sodium (VOLTAREN) 1 % GEL [id number] No Apply 2 g topically 4 (four) times daily as needed (chest pain.). [provider] Taking Active Self  ferrous sulfate 325 (65 FE) MG tablet [id number] No Take 325 mg by mouth 2 (two) times daily. [provider] Taking Active Self  fluticasone (FLONASE) 50 MCG/ACT nasal spray [id number] No Place 1 spray into both nostrils daily as needed for allergies or rhinitis. [provider] Taking Active Self  hydrALAZINE (APRESOLINE) 100 MG tablet [id number]  TAKE 1 TABLET BY MOUTH 3 TIMES DAILY. Skeet Latch, MD  Active   insulin glargine (LANTUS) 100 unit/mL SOPN [id number] No Inject 5 Units into the skin at bedtime. [provider] Taking Active Self           Med Note Stanford Scotland   Tue Aug 19, 2021  9:37 AM)    isosorbide mononitrate (IMDUR) 60 MG 24 hr tablet [id number] No Take 1.5 tablets (90 mg total) by mouth daily. [place], Maricela Bo, FNP Taking Active Self   linaclotide Rolan Lipa) 145 MCG CAPS capsule [id number] No Take 1 capsule (145 mcg total) by mouth daily before breakfast. Zehr, Laban Emperor, PA-C Taking Active   loratadine (CLARITIN) 10 MG tablet [id number] No Take 10 mg by mouth in the morning. [provider] Taking Active Self  Multiple Vitamin (MULTIVITAMIN ADULT) TABS [id number] No Take 1 tablet by mouth in the morning. [provider] Taking Active Self  nitroGLYCERIN (NITROSTAT) 0.4 MG SL tablet [id number] No Place 1 tablet (0.4 mg total) under the tongue every 5 (five) minutes as needed for chest pain. [name], [name], MD Taking Active Self           Med Note Gilman Buttner, Juanita Laster   Tue Aug 19, 2021  9:35 AM)    Cira Servant FLEXPEN 100 UNIT/ML FlexPen [id number] No Inject 3 Units into the skin 3 (three) times daily with meals. [provider] Taking Active Self           Med Note (ADSIDE, Tiney Rouge   Fri Apr 17, 2022  8:30 AM)    Omega-3 Fatty Acids (FISH OIL) 1200 MG CAPS [id number] No Take 1,200 mg by mouth in the morning. [provider] Taking Active Self  Polyethylene Glycol  3350 (MIRALAX PO) [id number] No Take by mouth daily as needed. [provider] Taking Active   potassium chloride SA (KLOR-CON M) 20 MEQ tablet [id number] No Take 20 mEq by mouth daily. [provider] Taking Active            Med Note Jodi Mourning, Toney Reil Apr 13, 2022  9:26 AM) Dewaine Conger ~ 3 times weekly, on days she takes torsemide 40 mg   pregabalin (LYRICA) 25 MG capsule [id number] No Take 25 mg by mouth 2 (two) times daily. [provider] Taking Active Self  Semaglutide, 1 MG/DOSE, (OZEMPIC, 1 MG/DOSE,) 4 MG/3ML SOPN [id number] No Inject 1 mg into the skin once a week. Buford Dresser, MD Taking Active   sertraline (ZOLOFT) 50 MG tablet [id number] No Take 50 mg by mouth in the morning. [provider] Taking Active Self  spironolactone (ALDACTONE) 25 MG tablet [id number] No Take 25 mg by  mouth daily. [provider] Taking Active   torsemide (DEMADEX) 20 MG tablet [id number]  Take 1 tablet (20 mg total) by mouth daily as needed. [place], [place], [place]  Active   traZODone (DESYREL) 50 MG tablet [id number] No Take 50 mg by mouth at bedtime. [provider] Taking Active Self  TURMERIC CURCUMIN PO [id number] No Take by mouth. [provider] Taking Active            Patient Active Problem List   Diagnosis Date Noted   Chronic constipation 03/31/2022   Pain due to onychomycosis of toenails of both feet 12/05/2021   Pure hypercholesterolemia 05/27/2021   Chronic diastolic CHF (congestive heart failure) ([place]) 04/29/2021   Hypertensive urgency 04/14/2021   Acute on chronic diastolic CHF (congestive heart failure) ([place]) 04/14/2021   Chest pain 03/05/2021   CAD (coronary artery disease) 03/05/2021   T2DM (type 2 diabetes mellitus) ([place]) 03/05/2021   CKD (chronic kidney disease) stage 4, GFR 15-29 ml/min ([place]) 03/05/2021   Class 3 obesity ([place]) 03/05/2021   CVA (cerebral vascular accident) ([place]) 03/05/2021   Acute on chronic diastolic heart failure ([place]) 03/05/2021   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   AKI (acute kidney injury) ([place]) 03/05/2021   Vitreous hemorrhage of left eye ([place]) 11/14/2020   Orthopnea 08/06/2020   Vision loss of right eye 08/06/2020   Excessive daytime sleepiness 01/11/2020   Iron deficiency anemia 12/14/2019   Vitreous hemorrhage, right ([place]) 12/04/2019   Diabetic neuropathy ([place]) 07/26/2019   Proliferative diabetic retinopathy of both 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 ([place]) 02/03/2019   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   Conductive hearing loss, bilateral 03/03/2018   Chronic bilateral low back pain without sciatica 08/21/2015   Chronic kidney disease (CKD) stage G3b/A3, moderately decreased glomerular filtration rate (GFR) between 30-44 mL/min/1.73 square meter and albuminuria creatinine  ratio greater than 300 mg/g ([place]) 04/30/2015   Type 2 diabetes mellitus with diabetic polyneuropathy, with long-term current use of insulin ([place]) 02/15/2015   Advance directive on file 05/17/2014   Refusal of blood transfusions as patient is Jehovah's Witness 05/17/2014   OSA treated with BiPAP 02/23/2014   Coronary artery disease 07/07/2013   Severe obesity (BMI >= 40) ([place]) 05/18/2013   Neuropathy 07/37/1062   Diastolic heart failure ([place]) 10/16/2012   Essential hypertension 10/16/2012   Conditions to be addressed/monitored per PCP order:  Chronic healthcare management needs, neuropathy, HTN, retinopathy, HTN, CKD, DM, CAD. OSA, chronic pain  Care Plan : [place]  Updates made by Gayla Medicus, RN since 07/07/2022 12:00 AM     Problem: Knowledge Deficit and Care Coordination Needs Related to Management of HTN, HF, Type 2 DM   Priority: High     Long-Range Goal: Development of Plan of Care to Address Knowledge Deficits and Care Coordination Needs related to  management of CHF, CAD, HTN, HLD, DMII, CKD Stage 4, and Depression   Start Date: 05/06/2021  Expected End Date: 10/07/2022  Priority: High  Note:   Current Barriers:  Knowledge Deficits related to plan of care for management of CHF, CAD, HTN, HLD, DMII, CKD Stage 4, and Depression 07/07/22:  patient now taking Linzess for constipation.  Left eye surgery successful 12/13 for cataract and has follow up this month.  Seen by Nephrology in December.  BP and BS stable.  Patient requests appt with LCSW regarding depression-appt scheduled.  RNCM Clinical Goal(s):  Patient will demonstrate ongoing adherence to prescribed treatment plan for CHF, CAD, HTN, HLD, DMII, CKD Stage 4, and Depression as evidenced by patient report of improved health and quality of life and no unplanned ED or unplanned hospital admissions continue to work with RN Care Manager and/or Social Worker to address care management and care coordination  needs related to CHF, CAD, HTN, HLD, DMII, CKD Stage 4, and Depression as evidenced by adherence to CM Team Scheduled appointments      Work with [facility] LCSW and psychiatrist to treat depression  Interventions: Inter-disciplinary care team collaboration (see longitudinal plan of care) Evaluation of current treatment plan related to  self management and patient's adherence to plan as established by provider Mailed summary of benefits for her [place] Medicaid plan to her home address Collaborated with Care Guide for dental resources-completed. Care Guide referral for dental resources Collaborated with BSW for assistance with [facility] appt.-completed. BSW referral for appt assistance. Collaborated with Pharmacy regarding  uncontrolled HTN Pharmacy referral for uncontrolled HTN-completed. Collaborated with LCSW for depression LCSW referral for depression   Heart Failure Interventions:  (Status: Goal on Track (progressing): YES.)  Long Term Goal - patient states her fluid retention has significantly improved,  says she cannot tolerate the compression stockings because they are too tight and make her legs hurt Assessed patient's heart failure management strategies  Diabetes:  (Status: Goal on Track (progressing): YES.) Long Term Goal   Lab Results  Component Value Date   HGBA1C 6.7 (H) 04/29/2021   Hgn A1C=6.3 on 12/26/21 Hgb A1C=5.2 in October per patient Assessed patient's understanding of A1C goal: <7% Reviewed medications with patient and discussed importance of medication adherence;        Counseled on importance of regular laboratory monitoring as prescribed;        Discussed plans with patient for ongoing care management follow up and provided patient with direct contact information for care management team;      Advised patient, providing education and rationale, to check cbg three times daily and record        call provider for findings outside established parameters;        Review of patient status, including review of consultants reports, relevant laboratory and other test results, and medications completed;       Discussed addition of Ozempic to medication regimen , reviewed mechanism of action and proven cardiovascular benefits  Hypertension and CKD stage 4 : (Status: Goal on Track (progressing): YES.) Long Term Goal-  has been placed on kidney transplant waiting list, fistu;a placed 12/09/21,ready for use  03/11/22. Last practice recorded BP readings:  BP 04/01/22-145/87 BP 05/01/22-141/70 BP Readings from Last 3 Encounters:  09/30/21 120/70  09/12/21 (!) 107/58  09/08/21 (!) 144/71     Most recent eGFR/CrCl:    No components found for: CRCL Lab Results  Component Value Date   NA 136 08/19/2021   K 3.9 08/19/2021   CREATININE 2.51 (H) 08/19/2021   EGFR 19 (L) 03/13/2021   GFRNONAA 22 (L) 08/19/2021   GLUCOSE 122 (H) 08/19/2021    Evaluation of current treatment plan related to hypertension self management and patient's adherence to plan as established by provider;   Reviewed medications with patient and discussed importance of compliance;  Discussed plans with patient for ongoing care management follow up and provided patient with direct contact information for care management team; Advised patient, providing education and rationale, to monitor blood pressure daily and record, calling PCP for findings outside established parameters;  Reviewed scheduled/upcoming provider appointments  Patient with labile blood pressures, medication adjusted, patient ot f/u with nephrologist.  Weight Loss Interventions:  (Status:  New goal.) Long Term Goal--patient has started Ozempic Discussed indications, mechanism of action, common side effects and CV benefits  of GLP1 Ozempic Assessed tolerance of Ozempic 07/07/22-weight is 203 currently-in total patient has lost close to 70 pounds   Interdisciplinary Collaboration:  (Status: Goal on Track (progressing): YES.)  Long Term Goal  Collaborated with BSW to initiate plan of care to address needs related to Housing barriers, Inability to perform ADL's independently, and Inability to perform IADL's independently in patient with CHF, CAD, HTN, HLD, DM, CKD Stage 4, and Depression Assessed status of patient's involvement with behavioral health counselor Collaboration with pharmacist for medication review, discussion of ? Medication side effects causing chronic constipation and arranging for patient to received home delivery of medications in blister packs Inter-disciplinary care team collaboration (see longitudinal plan of care)  Patient Goals/Self-Care Activities: Take medications as prescribed   Attend all scheduled provider appointments Call pharmacy for medication refills 3-7 days in advance of running out of medications Attend church or other social activities Perform all self care activities independently  Call provider office for new concerns or questions  Work with the social worker to address care coordination needs and will continue to work with the clinical team to address health care and disease management related needs call office if I gain more than 2 pounds in one day or 5 pounds in one week keep legs up while sitting track weight in diary use salt in moderation watch for swelling in feet, ankles and legs every day weigh myself daily know when to call the doctorfor fluid weight gain or consistently abnormal BP readings at home track symptoms and what helps feel better or worse check blood sugar at prescribed times: three times daily enter blood sugar readings and medication or insulin into daily log take the blood sugar log to all doctor visits take the blood sugar meter to all doctor visits eat fish at least once per week fill half of plate with vegetables manage portion size check blood pressure daily write blood pressure results in a log or diary learn about high blood pressure take  blood pressure log to all doctor appointments call doctor for signs and symptoms of high blood pressure keep all doctor appointments take medications for blood pressure exactly as prescribed Limit fluid intake to 1 liter per day Collaborate with managed Medicaid team of BSW and pharmacist to address identified needs Work with cardiology pharmacist to  assist with BP stabilization Work with [facility] LCSW and psychiatrist to treat depression Review summary of [employer] benefits mailed to home address and call to enroll in program that will benefit your chronic health issues Continue to use [place] [facility] calendar to record weight , BP and CBGs so readings are all in one place Schedule dental appt.   Long-Range Goal: Establish Plan of Care for Chronic Disease Management Needs   Priority: High  Note:   Timeframe:  Long-Range Goal Priority:  High Start Date:     11/12/21                        Expected End Date:  ongoing                     Follow Up Date: 08/04/22   - schedule appointment for vaccines needed due to my age or health - schedule recommended health tests (blood work, mammogram, colonoscopy, pap test) - schedule and keep appointment for annual check-up    Why is this important?   Screening tests can find diseases early when they are easier to treat.  Your doctor or nurse will talk with you about which tests are important for you.  Getting shots for common diseases like the flu and shingles will help prevent them.  07/07/22:  Patient with eye surgery 12/13 to remove cataract.  Seen and evaluated 12/7 by Nephrologist   Follow Up:  Patient agrees to Care Plan and Follow-up.  Plan: The Managed Medicaid care management team will reach out to the patient again over the next 30 business  days. and The  Parent has been provided with contact information for the Managed Medicaid care management team and has been advised to call with any health related questions or  concerns.  Date/time of next scheduled RN care management/care coordination outreach: 08/04/22 at 1030.

## 2022-07-07 NOTE — Patient Instructions (Signed)
Hi Ms. Varma, it was a pleasure speaking with you this morning-have a terrific day!!  Ms. Christoffersen was given information about Medicaid Managed Care team care coordination services as a part of their [facility] Medicaid benefit. Ellyn Rubiano verbally consented to engagement with the [facility] Managed Care team.   If you are experiencing a medical emergency, please call 911 or report to your local emergency department or urgent care.   If you have a non-emergency medical problem during routine business hours, please contact your provider's office and ask to speak with a nurse.   For questions related to your [facility] health plan, please call: [phone] or go here:[email or web address]  If you would like to schedule transportation through your [facility] plan, please call the following number at least 2 days in advance of your appointment: [phone].  You can also use the MTM portal or MTM mobile app to manage your rides. For the portal, please go to [email or web address].  Call the [place] at [phone], at any time, 24 hours a day, 7 days a week. If you are in danger or need immediate medical attention call 911.  If you would like help to quit smoking, call 1-800-QUIT-NOW 470-224-3230) OR Espaol: 1-855-Djelo-Ya (3-154-008-6761) o para ms informacin haga clic aqu or Text READY to 200-400 to register via text  Ms. Hamid - following are the goals we discussed in your visit today:   Goals Addressed    Timeframe:  Long-Range Goal Priority:  High Start Date:     11/12/21                        Expected End Date:  ongoing                     Follow Up Date: 08/04/22   - schedule appointment for vaccines needed due to my age or health - schedule recommended health tests (blood work, mammogram, colonoscopy, pap test) - schedule and keep appointment for annual check-up    Why is this important?   Screening tests can find diseases early when they  are easier to treat.  Your doctor or nurse will talk with you about which tests are important for you.  Getting shots for common diseases like the flu and shingles will help prevent them.  07/07/22:  Patient with eye surgery 12/13 to remove cataract.  Seen and evaluated 12/7 by Nephrologist  Patient verbalizes understanding of instructions and care plan provided today and agrees to view in [place]. Active MyChart status and patient understanding of how to access instructions and care plan via MyChart confirmed with patient.     The Managed Medicaid care management team will reach out to the patient again over the next 30 business  days.  The  Patient  has been provided with contact information for the Managed Medicaid care management team and has been advised to call with any health related questions or concerns.   Aida Raider RN, BSN [place] Management Coordinator - Managed Medicaid High Risk [phone]   Following is a copy of your plan of care:  Care Plan : [place]  Updates made by Gayla Medicus, RN since 07/07/2022 12:00 AM     Problem: Knowledge Deficit and Care Coordination Needs Related to Management of HTN, HF, Type 2 DM   Priority: High     Long-Range Goal: Development of Plan of Care to  Address Knowledge Deficits and Care Coordination Needs related to  management of CHF, CAD, HTN, HLD, DMII, CKD Stage 4, and Depression   Start Date: 05/06/2021  Expected End Date: 10/07/2022  Priority: High  Note:   Current Barriers:  Knowledge Deficits related to plan of care for management of CHF, CAD, HTN, HLD, DMII, CKD Stage 4, and Depression 07/07/22:  patient now taking Linzess for constipation.  Left eye surgery successful 12/13 for cataract and has follow up this month.  Seen by Nephrology in December.  BP and BS stable.  Patient requests appt with LCSW regarding depression-appt scheduled.  RNCM Clinical Goal(s):  Patient will  demonstrate ongoing adherence to prescribed treatment plan for CHF, CAD, HTN, HLD, DMII, CKD Stage 4, and Depression as evidenced by patient report of improved health and quality of life and no unplanned ED or unplanned hospital admissions continue to work with RN Care Manager and/or Social Worker to address care management and care coordination needs related to CHF, CAD, HTN, HLD, DMII, CKD Stage 4, and Depression as evidenced by adherence to CM Team Scheduled appointments      Work with [facility] LCSW and psychiatrist to treat depression  Interventions: Inter-disciplinary care team collaboration (see longitudinal plan of care) Evaluation of current treatment plan related to  self management and patient's adherence to plan as established by provider Mailed summary of benefits for her [place] Medicaid plan to her home address Collaborated with Care Guide for dental resources-completed. Care Guide referral for dental resources Collaborated with BSW for assistance with [facility] [place] appt.-completed. BSW referral for appt assistance. Collaborated with Pharmacy regarding  uncontrolled HTN Pharmacy referral for uncontrolled HTN-completed. Collaborated with LCSW for depression LCSW referral for depression   Heart Failure Interventions:  (Status: Goal on Track (progressing): YES.)  Long Term Goal - patient states her fluid retention has significantly improved,  says she cannot tolerate the compression stockings because they are too tight and make her legs hurt Assessed patient's heart failure management strategies  Diabetes:  (Status: Goal on Track (progressing): YES.) Long Term Goal   Lab Results  Component Value Date   HGBA1C 6.7 (H) 04/29/2021   Hgn A1C=6.3 on 12/26/21 Hgb A1C=5.2 in October per patient Assessed patient's understanding of A1C goal: <7% Reviewed medications with patient and discussed importance of medication adherence;        Counseled on importance of regular laboratory  monitoring as prescribed;        Discussed plans with patient for ongoing care management follow up and provided patient with direct contact information for care management team;      Advised patient, providing education and rationale, to check cbg three times daily and record        call provider for findings outside established parameters;       Review of patient status, including review of consultants reports, relevant laboratory and other test results, and medications completed;       Discussed addition of Ozempic to medication regimen , reviewed mechanism of action and proven cardiovascular benefits  Hypertension and CKD stage 4 : (Status: Goal on Track (progressing): YES.) Long Term Goal-  has been placed on kidney transplant waiting list, fistu;a placed 12/09/21,ready for use 03/11/22. Last practice recorded BP readings:  BP 04/01/22-145/87 BP 05/01/22-141/70 BP Readings from Last 3 Encounters:  09/30/21 120/70  09/12/21 (!) 107/58  09/08/21 (!) 144/71     Most recent eGFR/CrCl:    No components found for: CRCL Lab Results  Component  Value Date   NA 136 08/19/2021   K 3.9 08/19/2021   CREATININE 2.51 (H) 08/19/2021   EGFR 19 (L) 03/13/2021   GFRNONAA 22 (L) 08/19/2021   GLUCOSE 122 (H) 08/19/2021    Evaluation of current treatment plan related to hypertension self management and patient's adherence to plan as established by provider;   Reviewed medications with patient and discussed importance of compliance;  Discussed plans with patient for ongoing care management follow up and provided patient with direct contact information for care management team; Advised patient, providing education and rationale, to monitor blood pressure daily and record, calling PCP for findings outside established parameters;  Reviewed scheduled/upcoming provider appointments  Patient with labile blood pressures, medication adjusted, patient ot f/u with nephrologist.  Weight Loss Interventions:   (Status:  New goal.) Long Term Goal--patient has started Ozempic Discussed indications, mechanism of action, common side effects and CV benefits  of GLP1 Ozempic Assessed tolerance of Ozempic 07/07/22-weight is 203 currently-in total patient has lost close to 70 pounds   Interdisciplinary Collaboration:  (Status: Goal on Track (progressing): YES.) Long Term Goal  Collaborated with BSW to initiate plan of care to address needs related to Housing barriers, Inability to perform ADL's independently, and Inability to perform IADL's independently in patient with CHF, CAD, HTN, HLD, DM, CKD Stage 4, and Depression Assessed status of patient's involvement with behavioral health counselor Collaboration with pharmacist for medication review, discussion of ? Medication side effects causing chronic constipation and arranging for patient to received home delivery of medications in blister packs Inter-disciplinary care team collaboration (see longitudinal plan of care)  Patient Goals/Self-Care Activities: Take medications as prescribed   Attend all scheduled provider appointments Call pharmacy for medication refills 3-7 days in advance of running out of medications Attend church or other social activities Perform all self care activities independently  Call provider office for new concerns or questions  Work with the social worker to address care coordination needs and will continue to work with the clinical team to address health care and disease management related needs call office if I gain more than 2 pounds in one day or 5 pounds in one week keep legs up while sitting track weight in diary use salt in moderation watch for swelling in feet, ankles and legs every day weigh myself daily know when to call the doctorfor fluid weight gain or consistently abnormal BP readings at home track symptoms and what helps feel better or worse check blood sugar at prescribed times: three times daily enter blood  sugar readings and medication or insulin into daily log take the blood sugar log to all doctor visits take the blood sugar meter to all doctor visits eat fish at least once per week fill half of plate with vegetables manage portion size check blood pressure daily write blood pressure results in a log or diary learn about high blood pressure take blood pressure log to all doctor appointments call doctor for signs and symptoms of high blood pressure keep all doctor appointments take medications for blood pressure exactly as prescribed Limit fluid intake to 1 liter per day Collaborate with managed Medicaid team of BSW and pharmacist to address identified needs Work with cardiology pharmacist to assist with BP stabilization Work with [facility] LCSW and psychiatrist to treat depression Review summary of Wellcare benefits mailed to home address and call to enroll in program that will benefit your chronic health issues Continue to use [place] [facility] calendar to record weight ,  BP and CBGs so readings are all in one place Schedule dental appt.

## 2022-07-08 DIAGNOSIS — E113592 Type 2 diabetes mellitus with proliferative diabetic retinopathy without macular edema, left eye: Secondary | ICD-10-CM | POA: Diagnosis not present

## 2022-07-08 DIAGNOSIS — H4312 Vitreous hemorrhage, left eye: Secondary | ICD-10-CM | POA: Diagnosis not present

## 2022-07-09 ENCOUNTER — Other Ambulatory Visit: Payer: Medicaid Other | Admitting: Licensed Clinical Social Worker

## 2022-07-09 DIAGNOSIS — E113592 Type 2 diabetes mellitus with proliferative diabetic retinopathy without macular edema, left eye: Secondary | ICD-10-CM | POA: Diagnosis not present

## 2022-07-09 DIAGNOSIS — H4312 Vitreous hemorrhage, left eye: Secondary | ICD-10-CM | POA: Diagnosis not present

## 2022-07-09 NOTE — Patient Instructions (Signed)
Visit Information  [name] was given information about Medicaid Managed Care team care coordination services as a part of their [facility] Medicaid benefit. Maria Hudson verbally consented to engagement with the [facility] Managed Care team.   If you are experiencing a medical emergency, please call 911 or report to your local emergency department or urgent care.   If you have a non-emergency medical problem during routine business hours, please contact your provider's office and ask to speak with a nurse.   For questions related to your [facility] health plan, please call: [phone] or go here:[email or web address]  If you would like to schedule transportation through your [facility] plan, please call the following number at least 2 days in advance of your appointment: [phone].  You can also use the MTM portal or MTM mobile app to manage your rides. For the portal, please go to [email or web address].  Call the [place] at [phone], at any time, 24 hours a day, 7 days a week. If you are in danger or need immediate medical attention call 911.  If you would like help to quit smoking, call 1-800-QUIT-NOW 432-660-4984) OR Espaol: 1-855-Djelo-Ya (0-347-425-9563) o para ms informacin haga clic aqu or Text READY to 200-400 to register via text   Following is a copy of your plan of care:  Care Plan : General Social Work (Adult)  Updates made by Greg Cutter, LCSW since 07/09/2022 12:00 AM     Problem: Anxiety Identification (Anxiety)      Long-Range Goal: Anxiety Symptoms Identified   Start Date: 05/16/2021  Priority: High  Note:    Timeframe:  Long-Range Goal Priority:  High Start Date:    07/09/22                 Expected End Date:   ongoing                     Follow Up Date- 07/17/22 at 1030 am  Current barriers:   Chronic Mental Health needs related to depression Mental Health Concerns  and Social Isolation Needs Support,  Education, and Care Coordination in order to meet unmet mental health needs. Clinical Goal(s): demonstrate a reduction in symptoms related to :Depression: loss of energy/fatigue feelings of worthlessness, guilt  verbalize understanding of plan for management of Depression    Types of Stress There are two types of stress: Emotional - types of emotional stress are relationship problems, pressure at work, financial worries, experiencing discrimination or having a major life change. Physical - Examples of physical stress include being sick having pain, not sleeping well, recovery from an injury or having an alcohol and drug use disorder. Fight or Flight Sudden or ongoing stress activates your nervous system and floods your bloodstream with adrenaline and cortisol, two hormones that raise blood pressure, increase heart rate and spike blood sugar. These changes pitch your body into a fight or flight response. That enabled our ancestors to outrun saber-toothed tigers, and it's helpful today for situations like dodging a car accident. But most modern chronic stressors, such as finances or a challenging relationship, keep your body in that heightened state, which hurts your health. Effects of Too Much Stress If constantly under stress, most of [place] will eventually start to function less well.  Multiple studies link chronic stress to a higher risk of heart disease, stroke, depression, weight gain, memory loss and even premature death, so it's important to recognize the warning signals. Talk to your doctor about ways  to manage stress if you're experiencing any of these symptoms: Prolonged periods of poor sleep. Regular, severe headaches. Unexplained weight loss or gain. Feelings of isolation, withdrawal or worthlessness. Constant anger and irritability. Loss of interest in activities. Constant worrying or obsessive thinking. Excessive alcohol or drug use. Inability to concentrate.  10 Ways to Cope with  Chronic Stress It's key to recognize stressful situations as they occur because it allows you to focus on managing how you react. We all need to know when to close our eyes and take a deep breath when we feel tension rising. Use these tips to prevent or reduce chronic stress. 1. Rebalance Work and Home All work and no play? If you're spending too much time at the office, intentionally put more dates in your calendar to enjoy time for fun, either alone or with others. 2. Get Regular Exercise Moving your body on a regular basis balances the nervous system and increases blood circulation, helping to flush out stress hormones. Even a daily 20-minute walk makes a difference. Any kind of exercise can lower stress and improve your mood ? just pick activities that you enjoy and make it a regular habit. 3. Eat Well and Limit Alcohol and Stimulants Alcohol, nicotine and caffeine may temporarily relieve stress but have negative health impacts and can make stress worse in the long run. Well-nourished bodies cope better, so start with a good breakfast, add more organic fruits and vegetables for a well-balanced diet, avoid processed foods and sugar, try herbal tea and drink more water. 4. Connect with Supportive People Talking face to face with another person releases hormones that reduce stress. Lean on those good listeners in your life. 5. [place] Time Do you enjoy gardening, reading, listening to music or some other creative pursuit? Engage in activities that bring you pleasure and joy; research shows that reduces stress by almost half and lowers your heart rate, too. 6. Practice Meditation, Stress Reduction or Yoga Relaxation techniques activate a state of restfulness that counterbalances your body's fight-or-flight hormones. Even if this also means a 10-minute break in a long day: listen to music, read, go for a walk in nature, do a hobby, take a bath or spend time with a friend. Also consider doing a  mindfulness exercise or try a daily deep breathing or imagery practice. Deep Breathing Slow, calm and deep breathing can help you relax. Try these steps to focus on your breathing and repeat as needed. Find a comfortable position and close your eyes. Exhale and drop your shoulders. Breathe in through your nose; fill your lungs and then your belly. Think of relaxing your body, quieting your mind and becoming calm and peaceful. Breathe out slowly through your nose, relaxing your belly. Think of releasing tension, pain, worries or distress. Repeat steps three and four until you feel relaxed. Imagery This involves using your mind to excite the senses -- sound, vision, smell, taste and feeling. This may help ease your stress. Begin by getting comfortable and then do some slow breathing. Imagine a place you love being at. It could be somewhere from your childhood, somewhere you vacationed or just a place in your imagination. Feel how it is to be in the place you're imagining. Pay attention to the sounds, air, colors, and who is there with you. This is a place where you feel cared for and loved. All is well. You are safe. Take in all the smells, sounds, tastes and feelings. As you do, feel your body  being nourished and healed. Feel the calm that surrounds you. Breathe in all the good. Breathe out any discomfort or tension. 7. Sleep Enough If you get less than seven to eight hours of sleep, your body won't tolerate stress as well as it could. If stress keeps you up at night, address the cause, and add extra meditation into your day to make up for the lost z's. Try to get seven to nine hours of sleep each night. Make a regular bedtime schedule. Keep your room dark and cool. Try to avoid computers, TV, cell phones and tablets before bed. 8. [place] with Connections You Enjoy Go out for a coffee with a friend, chat with a neighbor, call a family member, visit with a clergy member, or even hang out with your  pet. Clinical studies show that spending even a short time with a companion animal can cut anxiety levels almost in half. 9. Take a Vacation Getting away from it all can reset your stress tolerance by increasing your mental and emotional outlook, which makes you a happier, more productive person upon return. Leave your cellphone and laptop at home! 10. See a Counselor, Coach or Therapist If negative thoughts overwhelm your ability to make positive changes, it's time to seek professional help. Make an appointment today--your health and life are worth it."

## 2022-07-09 NOTE — Patient Outreach (Signed)
Medicaid Managed Care Social Work Note  07/09/2022 Name:  Maria Hudson MRN:  [id number] DOB:  25-Aug-1960  Maria Hudson is an 62 y.o. year old female who is a primary patient of Maria Ebbs, MD.  The Medicaid Managed Care Coordination team was consulted for assistance with:  Maria Hudson and Resources  Maria Hudson was given information about Medicaid Managed Care Coordination team services today. Maria Hudson Patient agreed to services and verbal consent obtained.  Engaged with patient  for by telephone forinitial visit in response to referral for case management and/or care coordination services.   Assessments/Interventions:  Review of past medical history, allergies, medications, health status, including review of consultants reports, laboratory and other test data, was performed as part of comprehensive evaluation and provision of chronic care management services.  SDOH: (Social Determinant of Health) assessments and interventions performed: SDOH Interventions    Flowsheet Row Patient Outreach Telephone from 07/09/2022 in [place] Patient Outreach Telephone from 07/07/2022 in [place] Patient Outreach Telephone from 05/01/2022 in [place] Patient Outreach Telephone from 04/01/2022 in [place] Patient Outreach Telephone from 02/25/2022 in [place] Patient Outreach Telephone from 01/19/2022 in [place] Coordination  SDOH Interventions        Food Insecurity Interventions -- -- Intervention Not Indicated -- -- --  Housing Interventions -- Intervention Not Indicated -- -- -- --  Transportation Interventions -- -- -- -- -- Intervention Not Indicated  Utilities Interventions -- -- -- -- Intervention Not Indicated --  Alcohol Usage Interventions -- -- Intervention Not  Indicated (Score <7) -- -- --  Depression Interventions/Treatment  Counseling, Medication Medication  [LCSW referral] -- -- -- --  Physical Activity Interventions -- -- -- Other (Comments)  [patient not physically able to] -- --  Stress Interventions Offered [employer], Provide Counseling -- -- -- -- Intervention Not Indicated  Social Connections Interventions -- -- -- -- Intervention Not Indicated --       Advanced Directives Status:  See Care Plan for related entries.  Care Plan                 Allergies  Allergen Reactions   Metformin And Related Nausea Only   Other     Clonidine patch causes skin irritation    Hydrocodone Itching    Patient able to tolerate when taken with Benadryl.   Oxycodone-Acetaminophen Itching    Patient able to tolerate when taken with Benadryl.    Medications Reviewed Today     Reviewed by Maria Cutter, LCSW (Social Worker) on 07/09/22 at 1128  Med List Status: <None>   Medication Order Taking? Sig Documenting Provider Last Dose Status Informant  Accu-Chek Softclix Lancets lancets [id number] No 3 (three) times daily. [provider] Taking Active Self  acetaminophen (TYLENOL) 500 MG tablet [id number] No Take 1,000 mg by mouth every 6 (six) hours as needed for moderate pain or headache. [provider] Taking Active Self  ACIDOPHILUS LACTOBACILLUS PO [id number] No Take 1 Capful by mouth daily. [provider] Taking Active Self  allopurinol (ZYLOPRIM) 100 MG tablet [id number] No Take 100 mg by mouth in the morning. [provider] Taking Active Self  amLODipine (NORVASC) 10 MG tablet [id number] No Take 10 mg by mouth daily. [provider] Taking Active   aspirin EC 81 MG tablet [id number] No Take 81 mg by mouth in  the morning. Swallow whole. [provider] Taking Active Self           Med Note Maria Hudson   Mon Apr 14, 2021  9:22 PM)    atorvastatin (LIPITOR) 40 MG tablet  [id number] No Take 1 tablet (40 mg total) by mouth every evening. Maria Latch, MD Taking Active Self  buPROPion [facility]) 100 MG tablet [id number] No Take 100 mg by mouth 2 (two) times daily. [provider] Taking Active Self  carvedilol (COREG) 25 MG tablet [id number] No Take 1 tablet (25 mg total) by mouth 2 (two) times daily with a meal. Maria Hudson, Maria Picket, MD Taking Expired 05/05/22 2359 Self  cholecalciferol (VITAMIN D3) 25 MCG (1000 UNIT) tablet [id number] No Take 2,000 Units by mouth daily. [provider] Taking Active Self  cloNIDine (CATAPRES) 0.3 MG tablet [id number] No Take 0.3 mg by mouth 3 (three) times daily. [provider] Taking Active Self           Med Note Maria Hudson   Tue Mar 03, 2022  9:23 AM)    dapagliflozin propanediol (FARXIGA) 10 MG TABS tablet [id number]  Take 1 tablet (10 mg total) by mouth daily before breakfast. Maria Bihari, FNP  Active   diclofenac Sodium (VOLTAREN) 1 % GEL [id number] No Apply 2 g topically 4 (four) times daily as needed (chest pain.). [provider] Taking Active Self  ferrous sulfate 325 (65 FE) MG tablet [id number] No Take 325 mg by mouth 2 (two) times daily. [provider] Taking Active Self  fluticasone (FLONASE) 50 MCG/ACT nasal spray [id number] No Place 1 spray into both nostrils daily as needed for allergies or rhinitis. [provider] Taking Active Self  hydrALAZINE (APRESOLINE) 100 MG tablet [id number]  TAKE 1 TABLET BY MOUTH 3 TIMES DAILY. Maria Latch, MD  Active   insulin glargine (LANTUS) 100 unit/mL SOPN [id number] No Inject 5 Units into the skin at bedtime. [provider] Taking Active Self           Med Note Maria Hudson   Tue Aug 19, 2021  9:37 AM)    isosorbide mononitrate (IMDUR) 60 MG 24 hr tablet [id number] No Take 1.5 tablets (90 mg total) by mouth daily. Maria Hudson, Maria Bo, FNP Taking Active Self  linaclotide Rolan Lipa) 145 MCG CAPS  capsule [id number] No Take 1 capsule (145 mcg total) by mouth daily before breakfast. Maria Hudson, Maria Emperor, PA-C Taking Active   loratadine (CLARITIN) 10 MG tablet [id number] No Take 10 mg by mouth in the morning. [provider] Taking Active Self  Multiple Vitamin (MULTIVITAMIN ADULT) TABS [id number] No Take 1 tablet by mouth in the morning. [provider] Taking Active Self  nitroGLYCERIN (NITROSTAT) 0.4 MG SL tablet [id number] No Place 1 tablet (0.4 mg total) under the tongue every 5 (five) minutes as needed for chest pain. Maria Hudson, Maria Picket, MD Taking Active Self           Med Note Gilman Buttner, Juanita Laster   Tue Aug 19, 2021  9:35 AM)    Cira Servant FLEXPEN 100 UNIT/ML FlexPen [id number] No Inject 3 Units into the skin 3 (three) times daily with meals. [provider] Taking Active Self           Med Note (ADSIDE, Tiney Rouge   Fri Apr 17, 2022  8:30 AM)    Omega-3 Fatty Acids (FISH OIL) 1200 MG CAPS [id number] No Take 1,200 mg by mouth in the morning. [provider] Taking  Active Self  Polyethylene Glycol 3350 (MIRALAX PO) [id number] No Take by mouth daily as needed. [provider] Taking Active   potassium chloride SA (KLOR-CON M) 20 MEQ tablet [id number] No Take 20 mEq by mouth daily. [provider] Taking Active            Med Note Jodi Mourning, Toney Reil Apr 13, 2022  9:26 AM) Dewaine Conger ~ 3 times weekly, on days she takes torsemide 40 mg   pregabalin (LYRICA) 25 MG capsule [id number] No Take 25 mg by mouth 2 (two) times daily. [provider] Taking Active Self  Semaglutide, 1 MG/DOSE, (OZEMPIC, 1 MG/DOSE,) 4 MG/3ML SOPN [id number] No Inject 1 mg into the skin once a week. Buford Dresser, MD Taking Active   sertraline (ZOLOFT) 50 MG tablet [id number] No Take 50 mg by mouth in the morning. [provider] Taking Active Self  spironolactone (ALDACTONE) 25 MG tablet [id number] No Take 25 mg by mouth daily. [provider]  Taking Active   torsemide (DEMADEX) 20 MG tablet [id number]  Take 1 tablet (20 mg total) by mouth daily as needed. [place], [place], [place]  Active   traZODone (DESYREL) 50 MG tablet [id number] No Take 50 mg by mouth at bedtime. [provider] Taking Active Self  TURMERIC CURCUMIN PO [id number] No Take by mouth. [provider] Taking Active             Patient Active Problem List   Diagnosis Date Noted   Chronic constipation 03/31/2022   Pain due to onychomycosis of toenails of both feet 12/05/2021   Pure hypercholesterolemia 05/27/2021   Chronic diastolic CHF (congestive heart failure) ([place]) 04/29/2021   Hypertensive urgency 04/14/2021   Acute on chronic diastolic CHF (congestive heart failure) ([place]) 04/14/2021   Chest pain 03/05/2021   CAD (coronary artery disease) 03/05/2021   T2DM (type 2 diabetes mellitus) ([place]) 03/05/2021   CKD (chronic kidney disease) stage 4, GFR 15-29 ml/min ([place]) 03/05/2021   Class 3 obesity ([place]) 03/05/2021   CVA (cerebral vascular accident) ([place]) 03/05/2021   Acute on chronic diastolic heart failure ([place]) 03/05/2021   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   AKI (acute kidney injury) ([place]) 03/05/2021   Vitreous hemorrhage of left eye ([place]) 11/14/2020   Orthopnea 08/06/2020   Vision loss of right eye 08/06/2020   Excessive daytime sleepiness 01/11/2020   Iron deficiency anemia 12/14/2019   Vitreous hemorrhage, right ([place]) 12/04/2019   Diabetic neuropathy ([place]) 07/26/2019   Proliferative diabetic retinopathy of both 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 ([place]) 02/03/2019   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   Conductive hearing loss, bilateral 03/03/2018   Chronic bilateral low back pain without sciatica 08/21/2015   Chronic kidney disease (CKD) stage G3b/A3, moderately decreased glomerular filtration rate (GFR) between 30-44 mL/min/1.73 square meter and albuminuria creatinine ratio greater than 300 mg/g ([place])  04/30/2015   Type 2 diabetes mellitus with diabetic polyneuropathy, with long-term current use of insulin ([place]) 02/15/2015   Advance directive on file 05/17/2014   Refusal of blood transfusions as patient is Jehovah's Witness 05/17/2014   OSA treated with BiPAP 02/23/2014   Coronary artery disease 07/07/2013   Severe obesity (BMI >= 40) ([place]) 05/18/2013   Neuropathy 17/00/1749   Diastolic heart failure ([place]) 10/16/2012   Essential hypertension 10/16/2012    Conditions to be addressed/monitored per PCP order:  Depression  Care Plan : General Social Work (Adult)  Updates made by Maria Cutter, LCSW since  07/09/2022 12:00 AM     Problem: Anxiety Identification (Anxiety)      Long-Range Goal: Anxiety Symptoms Identified   Start Date: 05/16/2021  Priority: High  Note:    Timeframe:  Long-Range Goal Priority:  High Start Date:    07/09/22                 Expected End Date:   ongoing                     Follow Up Date- 07/17/22 at 1030 am  Current barriers:   Chronic Mental Health needs related to depression Mental Health Concerns  and Social Isolation Needs Support, Education, and Care Coordination in order to meet unmet mental health needs. Clinical Goal(s): demonstrate a reduction in symptoms related to :Depression: loss of energy/fatigue feelings of worthlessness, guilt  verbalize understanding of plan for management of Depression   Clinical Interventions:  Assessed patient's previous and current treatment, coping skills, support system and barriers to care  Depression screen reviewed  Active listening / Reflection utilized  Behavioral Activation reviewed Provided brief CBT  Suicidal Ideation/Homicidal Ideation assessed: Discussed Guardianship and reviewed process  Pray of Attorney  Discussed referral for psychiatry , patient does not wish to pursue a referral for psychiatry at this time Made referral to [facility] for counseling on 07/09/22 and  email was sent to patient today as well with mental health resources Review resources, discussed options and provided patient information about  Options for mental health treatment based on need and insurance  Inter-disciplinary care team collaboration (see longitudinal plan of care) Patient Goals/Self-Care Activities: Over the next 120 days REFERRAL PLACED I have placed a referral for counseling at [facility] and they will contact you.  If you have not heard from them in for counseling at [facility] and  please follow up.    The following coping skill education was provided for stress relief and mental health management: "When your car dies or a deadline looms, how do you respond? Long-term, low-grade or acute stress takes a serious toll on your body and mind, so don't ignore feelings of constant tension. Stress is a natural part of life. However, too much stress can harm our health, especially if it continues every day. This is chronic stress and can put you at risk for heart problems like heart disease and depression. Understand what's happening inside your body and learn simple coping skills to combat the negative impacts of everyday stressors.  Types of Stress There are two types of stress: Emotional - types of emotional stress are relationship problems, pressure at work, financial worries, experiencing discrimination or having a major life change. Physical - Examples of physical stress include being sick having pain, not sleeping well, recovery from an injury or having an alcohol and drug use disorder. Fight or Flight Sudden or ongoing stress activates your nervous system and floods your bloodstream with adrenaline and cortisol, two hormones that raise blood pressure, increase heart rate and spike blood sugar. These changes pitch your body into a fight or flight response. That enabled our ancestors to outrun saber-toothed tigers, and it's helpful today for situations like dodging  a car accident. But most modern chronic stressors, such as finances or a challenging relationship, keep your body in that heightened state, which hurts your health. Effects of Too Much Stress If constantly under stress, most of [place] will eventually start to function less well.  Multiple studies link chronic  stress to a higher risk of heart disease, stroke, depression, weight gain, memory loss and even premature death, so it's important to recognize the warning signals. Talk to your doctor about ways to manage stress if you're experiencing any of these symptoms: Prolonged periods of poor sleep. Regular, severe headaches. Unexplained weight loss or gain. Feelings of isolation, withdrawal or worthlessness. Constant anger and irritability. Loss of interest in activities. Constant worrying or obsessive thinking. Excessive alcohol or drug use. Inability to concentrate.  10 Ways to Cope with Chronic Stress It's key to recognize stressful situations as they occur because it allows you to focus on managing how you react. We all need to know when to close our eyes and take a deep breath when we feel tension rising. Use these tips to prevent or reduce chronic stress. 1. Rebalance Work and Home All work and no play? If you're spending too much time at the office, intentionally put more dates in your calendar to enjoy time for fun, either alone or with others. 2. Get Regular Exercise Moving your body on a regular basis balances the nervous system and increases blood circulation, helping to flush out stress hormones. Even a daily 20-minute walk makes a difference. Any kind of exercise can lower stress and improve your mood ? just pick activities that you enjoy and make it a regular habit. 3. Eat Well and Limit Alcohol and Stimulants Alcohol, nicotine and caffeine may temporarily relieve stress but have negative health impacts and can make stress worse in the long run. Well-nourished bodies cope better, so  start with a good breakfast, add more organic fruits and vegetables for a well-balanced diet, avoid processed foods and sugar, try herbal tea and drink more water. 4. Connect with Supportive People Talking face to face with another person releases hormones that reduce stress. Lean on those good listeners in your life. 5. [place] Time Do you enjoy gardening, reading, listening to music or some other creative pursuit? Engage in activities that bring you pleasure and joy; research shows that reduces stress by almost half and lowers your heart rate, too. 6. Practice Meditation, Stress Reduction or Yoga Relaxation techniques activate a state of restfulness that counterbalances your body's fight-or-flight hormones. Even if this also means a 10-minute break in a long day: listen to music, read, go for a walk in nature, do a hobby, take a bath or spend time with a friend. Also consider doing a mindfulness exercise or try a daily deep breathing or imagery practice. Deep Breathing Slow, calm and deep breathing can help you relax. Try these steps to focus on your breathing and repeat as needed. Find a comfortable position and close your eyes. Exhale and drop your shoulders. Breathe in through your nose; fill your lungs and then your belly. Think of relaxing your body, quieting your mind and becoming calm and peaceful. Breathe out slowly through your nose, relaxing your belly. Think of releasing tension, pain, worries or distress. Repeat steps three and four until you feel relaxed. Imagery This involves using your mind to excite the senses -- sound, vision, smell, taste and feeling. This may help ease your stress. Begin by getting comfortable and then do some slow breathing. Imagine a place you love being at. It could be somewhere from your childhood, somewhere you vacationed or just a place in your imagination. Feel how it is to be in the place you're imagining. Pay attention to the sounds, air,  colors, and who is there with you.  This is a place where you feel cared for and loved. All is well. You are safe. Take in all the smells, sounds, tastes and feelings. As you do, feel your body being nourished and healed. Feel the calm that surrounds you. Breathe in all the good. Breathe out any discomfort or tension. 7. Sleep Enough If you get less than seven to eight hours of sleep, your body won't tolerate stress as well as it could. If stress keeps you up at night, address the cause, and add extra meditation into your day to make up for the lost z's. Try to get seven to nine hours of sleep each night. Make a regular bedtime schedule. Keep your room dark and cool. Try to avoid computers, TV, cell phones and tablets before bed. 8. [place] with Connections You Enjoy Go out for a coffee with a friend, chat with a neighbor, call a family member, visit with a clergy member, or even hang out with your pet. Clinical studies show that spending even a short time with a companion animal can cut anxiety levels almost in half. 9. Take a Vacation Getting away from it all can reset your stress tolerance by increasing your mental and emotional outlook, which makes you a happier, more productive person upon return. Leave your cellphone and laptop at home! 10. See a Counselor, Coach or Therapist If negative thoughts overwhelm your ability to make positive changes, it's time to seek professional help. Make an appointment today--your health and life are worth it."        Follow up:  Patient agrees to Care Plan and Follow-up.  Plan: The Managed Medicaid care management team will reach out to the patient again over the next 30 days.  Date/time of next scheduled Social Work care management/care coordination outreach:  07/17/22 at 23 am  Eula Fried, BSW, MSW, [place] Medicaid LCSW [email or web address] Phone: [phone]

## 2022-07-13 ENCOUNTER — Other Ambulatory Visit: Payer: Medicaid Other | Admitting: Pharmacist

## 2022-07-13 ENCOUNTER — Telehealth: Payer: Self-pay | Admitting: Pharmacist

## 2022-07-13 NOTE — Progress Notes (Unsigned)
Attempted to contact patient for scheduled appointment for medication management. Left HIPAA compliant message for patient to return my call at their convenience.    Catie T. Kaelin Bonelli, PharmD, BCACP, CPP [facility] [phone]  

## 2022-07-15 DIAGNOSIS — G4733 Obstructive sleep apnea (adult) (pediatric): Secondary | ICD-10-CM | POA: Diagnosis not present

## 2022-07-16 ENCOUNTER — Telehealth: Payer: Self-pay

## 2022-07-16 DIAGNOSIS — Z419 Encounter for procedure for purposes other than remedying health state, unspecified: Secondary | ICD-10-CM | POA: Diagnosis not present

## 2022-07-16 DIAGNOSIS — G4733 Obstructive sleep apnea (adult) (pediatric): Secondary | ICD-10-CM | POA: Diagnosis not present

## 2022-07-16 NOTE — Progress Notes (Deleted)
Patient called me and left a voicemail. Will also send a voicemail as well as notify my scheduler.

## 2022-07-16 NOTE — Progress Notes (Signed)
   Care Guide Note  07/16/2022 Name: [name] MRN: [id number] DOB: 1961-03-28  Referred by: Nolene Ebbs, MD Reason for referral : Care Coordination (Outreach to reschedule missed f/u with pharm d in Late March )   [name] is a 62 y.o. year old female who is a primary care patient of Nolene Ebbs, MD. Braylin Formby was referred to the pharmacist for assistance related to DM.    An unsuccessful telephone outreach was attempted today to contact the patient who was referred to the pharmacy team for assistance with medication assistance. Additional attempts will be made to contact the patient.   Noreene Larsson, [place], [place] [address] Direct Dial: [phone] [email or web address]

## 2022-07-16 NOTE — Progress Notes (Signed)
Patient called me and left voicemail to reschedule. Routing to scheduler.

## 2022-07-17 ENCOUNTER — Other Ambulatory Visit: Payer: Medicaid Other | Admitting: Licensed Clinical Social Worker

## 2022-07-17 NOTE — Patient Instructions (Signed)
Visit Information  Ms. Simien was given information about Medicaid Managed Care team care coordination services as a part of their [facility] Medicaid benefit. Emmy Keng verbally consented to engagement with the [facility] Managed Care team.   If you are experiencing a medical emergency, please call 911 or report to your local emergency department or urgent care.   If you have a non-emergency medical problem during routine business hours, please contact your provider's office and ask to speak with a nurse.   For questions related to your [facility] health plan, please call: [phone] or go here:[email or web address]  If you would like to schedule transportation through your [facility] plan, please call the following number at least 2 days in advance of your appointment: [phone].  You can also use the MTM portal or MTM mobile app to manage your rides. For the portal, please go to [email or web address].  Call the [place] at [phone], at any time, 24 hours a day, 7 days a week. If you are in danger or need immediate medical attention call 911.  If you would like help to quit smoking, call 1-800-QUIT-NOW 617-663-1529) OR Espaol: 1-855-Djelo-Ya (9-450-388-8280) o para ms informacin haga clic aqu or Text READY to 200-400 to register via text   Following is a copy of your plan of care:  Care Plan : General Social Work (Adult)  Updates made by Greg Cutter, LCSW since 07/17/2022 12:00 AM     Problem: Anxiety Identification (Anxiety)      Long-Range Goal: Anxiety Symptoms Identified   Start Date: 05/16/2021  Priority: High  Note:    Timeframe:  Long-Range Goal Priority:  High Start Date:    07/09/22                 Expected End Date:   ongoing                     Follow Up Date- 07/29/22 at 230 pm  Current barriers:   Chronic Mental Health needs related to depression Mental Health Concerns  and Social Isolation Needs Support,  Education, and Care Coordination in order to meet unmet mental health needs. Clinical Goal(s): demonstrate a reduction in symptoms related to :Depression: loss of energy/fatigue feelings of worthlessness, guilt  verbalize understanding of plan for management of Depression     The following coping skill education was provided for stress relief and mental health management: "When your car dies or a deadline looms, how do you respond? Long-term, low-grade or acute stress takes a serious toll on your body and mind, so don't ignore feelings of constant tension. Stress is a natural part of life. However, too much stress can harm our health, especially if it continues every day. This is chronic stress and can put you at risk for heart problems like heart disease and depression. Understand what's happening inside your body and learn simple coping skills to combat the negative impacts of everyday stressors.  Types of Stress There are two types of stress: Emotional - types of emotional stress are relationship problems, pressure at work, financial worries, experiencing discrimination or having a major life change. Physical - Examples of physical stress include being sick having pain, not sleeping well, recovery from an injury or having an alcohol and drug use disorder. Fight or Flight Sudden or ongoing stress activates your nervous system and floods your bloodstream with adrenaline and cortisol, two hormones that raise blood pressure, increase heart rate and spike blood sugar. These changes  pitch your body into a fight or flight response. That enabled our ancestors to outrun saber-toothed tigers, and it's helpful today for situations like dodging a car accident. But most modern chronic stressors, such as finances or a challenging relationship, keep your body in that heightened state, which hurts your health. Effects of Too Much Stress If constantly under stress, most of [place] will eventually start to function less  well.  Multiple studies link chronic stress to a higher risk of heart disease, stroke, depression, weight gain, memory loss and even premature death, so it's important to recognize the warning signals. Talk to your doctor about ways to manage stress if you're experiencing any of these symptoms: Prolonged periods of poor sleep. Regular, severe headaches. Unexplained weight loss or gain. Feelings of isolation, withdrawal or worthlessness. Constant anger and irritability. Loss of interest in activities. Constant worrying or obsessive thinking. Excessive alcohol or drug use. Inability to concentrate.  10 Ways to Cope with Chronic Stress It's key to recognize stressful situations as they occur because it allows you to focus on managing how you react. We all need to know when to close our eyes and take a deep breath when we feel tension rising. Use these tips to prevent or reduce chronic stress. 1. Rebalance Work and Home All work and no play? If you're spending too much time at the office, intentionally put more dates in your calendar to enjoy time for fun, either alone or with others. 2. Get Regular Exercise Moving your body on a regular basis balances the nervous system and increases blood circulation, helping to flush out stress hormones. Even a daily 20-minute walk makes a difference. Any kind of exercise can lower stress and improve your mood ? just pick activities that you enjoy and make it a regular habit. 3. Eat Well and Limit Alcohol and Stimulants Alcohol, nicotine and caffeine may temporarily relieve stress but have negative health impacts and can make stress worse in the long run. Well-nourished bodies cope better, so start with a good breakfast, add more organic fruits and vegetables for a well-balanced diet, avoid processed foods and sugar, try herbal tea and drink more water. 4. Connect with Supportive People Talking face to face with another person releases hormones that reduce  stress. Lean on those good listeners in your life. 5. [place] Time Do you enjoy gardening, reading, listening to music or some other creative pursuit? Engage in activities that bring you pleasure and joy; research shows that reduces stress by almost half and lowers your heart rate, too. 6. Practice Meditation, Stress Reduction or Yoga Relaxation techniques activate a state of restfulness that counterbalances your body's fight-or-flight hormones. Even if this also means a 10-minute break in a long day: listen to music, read, go for a walk in nature, do a hobby, take a bath or spend time with a friend. Also consider doing a mindfulness exercise or try a daily deep breathing or imagery practice. Deep Breathing Slow, calm and deep breathing can help you relax. Try these steps to focus on your breathing and repeat as needed. Find a comfortable position and close your eyes. Exhale and drop your shoulders. Breathe in through your nose; fill your lungs and then your belly. Think of relaxing your body, quieting your mind and becoming calm and peaceful. Breathe out slowly through your nose, relaxing your belly. Think of releasing tension, pain, worries or distress. Repeat steps three and four until you feel relaxed. Imagery This involves using your mind  to excite the senses -- sound, vision, smell, taste and feeling. This may help ease your stress. Begin by getting comfortable and then do some slow breathing. Imagine a place you love being at. It could be somewhere from your childhood, somewhere you vacationed or just a place in your imagination. Feel how it is to be in the place you're imagining. Pay attention to the sounds, air, colors, and who is there with you. This is a place where you feel cared for and loved. All is well. You are safe. Take in all the smells, sounds, tastes and feelings. As you do, feel your body being nourished and healed. Feel the calm that surrounds you. Breathe in all the  good. Breathe out any discomfort or tension. 7. Sleep Enough If you get less than seven to eight hours of sleep, your body won't tolerate stress as well as it could. If stress keeps you up at night, address the cause, and add extra meditation into your day to make up for the lost z's. Try to get seven to nine hours of sleep each night. Make a regular bedtime schedule. Keep your room dark and cool. Try to avoid computers, TV, cell phones and tablets before bed. 8. [place] with Connections You Enjoy Go out for a coffee with a friend, chat with a neighbor, call a family member, visit with a clergy member, or even hang out with your pet. Clinical studies show that spending even a short time with a companion animal can cut anxiety levels almost in half. 9. Take a Vacation Getting away from it all can reset your stress tolerance by increasing your mental and emotional outlook, which makes you a happier, more productive person upon return. Leave your cellphone and laptop at home! 10. See a Counselor, Coach or Therapist If negative thoughts overwhelm your ability to make positive changes, it's time to seek professional help. Make an appointment today--your health and life are worth it."

## 2022-07-17 NOTE — Patient Outreach (Signed)
Medicaid Managed Care Social Work Note  07/17/2022 Name:  Maria Hudson MRN:  [id number] DOB:  22-Dec-1960  Maria Hudson is an 62 y.o. year old female who is a primary patient of Nolene Ebbs, MD.  The Medicaid Managed Care Coordination team was consulted for assistance with:  [place] and Resources  Ms. Krenzer was given information about Medicaid Managed Care Coordination team services today. [name] Patient agreed to services and verbal consent obtained.  Engaged with patient  for by telephone forfollow up visit in response to referral for case management and/or care coordination services.   Assessments/Interventions:  Review of past medical history, allergies, medications, health status, including review of consultants reports, laboratory and other test data, was performed as part of comprehensive evaluation and provision of chronic care management services.  SDOH: (Social Determinant of Health) assessments and interventions performed: SDOH Interventions    Flowsheet Row Patient Outreach Telephone from 07/17/2022 in [place] Patient Outreach Telephone from 07/09/2022 in [place] Patient Outreach Telephone from 07/07/2022 in [place] Patient Outreach Telephone from 05/01/2022 in [place] Patient Outreach Telephone from 04/01/2022 in [place] Patient Outreach Telephone from 02/25/2022 in [place] Coordination  SDOH Interventions        Food Insecurity Interventions -- -- -- Intervention Not Indicated -- --  Housing Interventions -- -- Intervention Not Indicated -- -- --  Utilities Interventions -- -- -- -- -- Intervention Not Indicated  Alcohol Usage Interventions -- -- -- Intervention Not Indicated (Score <7) -- --  Depression Interventions/Treatment  -- Counseling,  Medication Medication  [LCSW referral] -- -- --  Physical Activity Interventions -- -- -- -- Other (Comments)  [patient not physically able to] --  Stress Interventions [place], Provide Counseling  [Patient reports being sick] [employer], Provide Counseling -- -- -- --  Social Connections Interventions -- -- -- -- -- Intervention Not Indicated       Advanced Directives Status:  See Care Plan for related entries.  Care Plan                 Allergies  Allergen Reactions   Metformin And Related Nausea Only   Other     Clonidine patch causes skin irritation    Hydrocodone Itching    Patient able to tolerate when taken with Benadryl.   Oxycodone-Acetaminophen Itching    Patient able to tolerate when taken with Benadryl.    Medications Reviewed Today     Reviewed by Greg Cutter, LCSW (Social Worker) on 07/09/22 at 1128  Med List Status: <None>   Medication Order Taking? Sig Documenting Provider Last Dose Status Informant  Accu-Chek Softclix Lancets lancets [id number] No 3 (three) times daily. [provider] Taking Active Self  acetaminophen (TYLENOL) 500 MG tablet [id number] No Take 1,000 mg by mouth every 6 (six) hours as needed for moderate pain or headache. [provider] Taking Active Self  ACIDOPHILUS LACTOBACILLUS PO [id number] No Take 1 Capful by mouth daily. [provider] Taking Active Self  allopurinol (ZYLOPRIM) 100 MG tablet [id number] No Take 100 mg by mouth in the morning. [provider] Taking Active Self  amLODipine (NORVASC) 10 MG tablet [id number] No Take 10 mg by mouth daily. [provider] Taking Active   aspirin EC 81 MG tablet [id number] No Take 81 mg by mouth in the morning. Swallow  whole. [provider] Taking Active Self           Med Note [name]   Mon Apr 14, 2021  9:22 PM)    atorvastatin (LIPITOR) 40 MG tablet [id number] No Take 1 tablet  (40 mg total) by mouth every evening. Skeet Latch, MD Taking Active Self  buPROPion [facility]) 100 MG tablet [id number] No Take 100 mg by mouth 2 (two) times daily. [provider] Taking Active Self  carvedilol (COREG) 25 MG tablet [id number] No Take 1 tablet (25 mg total) by mouth 2 (two) times daily with a meal. [name], [name], MD Taking Expired 05/05/22 2359 Self  cholecalciferol (VITAMIN D3) 25 MCG (1000 UNIT) tablet [id number] No Take 2,000 Units by mouth daily. [provider] Taking Active Self  cloNIDine (CATAPRES) 0.3 MG tablet [id number] No Take 0.3 mg by mouth 3 (three) times daily. [provider] Taking Active Self           Med Note Stanford [place]   Tue Mar 03, 2022  9:23 AM)    dapagliflozin propanediol (FARXIGA) 10 MG TABS tablet [id number]  Take 1 tablet (10 mg total) by mouth daily before breakfast. Rafael Bihari, FNP  Active   diclofenac Sodium (VOLTAREN) 1 % GEL [id number] No Apply 2 g topically 4 (four) times daily as needed (chest pain.). [provider] Taking Active Self  ferrous sulfate 325 (65 FE) MG tablet [id number] No Take 325 mg by mouth 2 (two) times daily. [provider] Taking Active Self  fluticasone (FLONASE) 50 MCG/ACT nasal spray [id number] No Place 1 spray into both nostrils daily as needed for allergies or rhinitis. [provider] Taking Active Self  hydrALAZINE (APRESOLINE) 100 MG tablet [id number]  TAKE 1 TABLET BY MOUTH 3 TIMES DAILY. Skeet Latch, MD  Active   insulin glargine (LANTUS) 100 unit/mL SOPN [id number] No Inject 5 Units into the skin at bedtime. [provider] Taking Active Self           Med Note Stanford [place]   Tue Aug 19, 2021  9:37 AM)    isosorbide mononitrate (IMDUR) 60 MG 24 hr tablet [id number] No Take 1.5 tablets (90 mg total) by mouth daily. Macdona, Maricela Bo, FNP Taking Active Self  linaclotide Rolan Lipa) 145 MCG CAPS capsule [id number] No Take 1  capsule (145 mcg total) by mouth daily before breakfast. Zehr, Laban Emperor, PA-C Taking Active   loratadine (CLARITIN) 10 MG tablet [id number] No Take 10 mg by mouth in the morning. [provider] Taking Active Self  Multiple Vitamin (MULTIVITAMIN ADULT) TABS [id number] No Take 1 tablet by mouth in the morning. [provider] Taking Active Self  nitroGLYCERIN (NITROSTAT) 0.4 MG SL tablet [id number] No Place 1 tablet (0.4 mg total) under the tongue every 5 (five) minutes as needed for chest pain. [name], [name], MD Taking Active Self           Med Note Gilman Buttner, Juanita Laster   Tue Aug 19, 2021  9:35 AM)    Cira Servant FLEXPEN 100 UNIT/ML FlexPen [id number] No Inject 3 Units into the skin 3 (three) times daily with meals. [provider] Taking Active Self           Med Note (ADSIDE, Tiney Rouge   Fri Apr 17, 2022  8:30 AM)    Omega-3 Fatty Acids (FISH OIL) 1200 MG CAPS [id number] No Take 1,200 mg by mouth in the morning. [provider] Taking Active Self  Polyethylene Glycol 3350 (MIRALAX PO) [id number] No Take by mouth daily as needed. [provider] Taking Active   potassium chloride SA (KLOR-CON M) 20 MEQ tablet [id number] No Take 20 mEq by mouth daily. [provider] Taking Active            Med Note Jodi Mourning, Toney Reil Apr 13, 2022  9:26 AM) Dewaine Conger ~ 3 times weekly, on days she takes torsemide 40 mg   pregabalin (LYRICA) 25 MG capsule [id number] No Take 25 mg by mouth 2 (two) times daily. [provider] Taking Active Self  Semaglutide, 1 MG/DOSE, (OZEMPIC, 1 MG/DOSE,) 4 MG/3ML SOPN [id number] No Inject 1 mg into the skin once a week. Buford Dresser, MD Taking Active   sertraline (ZOLOFT) 50 MG tablet [id number] No Take 50 mg by mouth in the morning. [provider] Taking Active Self  spironolactone (ALDACTONE) 25 MG tablet [id number] No Take 25 mg by mouth daily. [provider] Taking Active   torsemide  (DEMADEX) 20 MG tablet [id number]  Take 1 tablet (20 mg total) by mouth daily as needed. [place], [place], [place]  Active   traZODone (DESYREL) 50 MG tablet [id number] No Take 50 mg by mouth at bedtime. [provider] Taking Active Self  TURMERIC CURCUMIN PO [id number] No Take by mouth. [provider] Taking Active             Patient Active Problem List   Diagnosis Date Noted   Chronic constipation 03/31/2022   Pain due to onychomycosis of toenails of both feet 12/05/2021   Pure hypercholesterolemia 05/27/2021   Chronic diastolic CHF (congestive heart failure) ([place]) 04/29/2021   Hypertensive urgency 04/14/2021   Acute on chronic diastolic CHF (congestive heart failure) ([place]) 04/14/2021   Chest pain 03/05/2021   CAD (coronary artery disease) 03/05/2021   T2DM (type 2 diabetes mellitus) ([place]) 03/05/2021   CKD (chronic kidney disease) stage 4, GFR 15-29 ml/min ([place]) 03/05/2021   Class 3 obesity ([place]) 03/05/2021   CVA (cerebral vascular accident) ([place]) 03/05/2021   Acute on chronic diastolic heart failure ([place]) 03/05/2021   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   AKI (acute kidney injury) ([place]) 03/05/2021   Vitreous hemorrhage of left eye ([place]) 11/14/2020   Orthopnea 08/06/2020   Vision loss of right eye 08/06/2020   Excessive daytime sleepiness 01/11/2020   Iron deficiency anemia 12/14/2019   Vitreous hemorrhage, right ([place]) 12/04/2019   Diabetic neuropathy ([place]) 07/26/2019   Proliferative diabetic retinopathy of both 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 ([place]) 02/03/2019   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   Conductive hearing loss, bilateral 03/03/2018   Chronic bilateral low back pain without sciatica 08/21/2015   Chronic kidney disease (CKD) stage G3b/A3, moderately decreased glomerular filtration rate (GFR) between 30-44 mL/min/1.73 square meter and albuminuria creatinine ratio greater than 300 mg/g ([place]) 04/30/2015   Type 2 diabetes  mellitus with diabetic polyneuropathy, with long-term current use of insulin ([place]) 02/15/2015   Advance directive on file 05/17/2014   Refusal of blood transfusions as patient is Jehovah's Witness 05/17/2014   OSA treated with BiPAP 02/23/2014   Coronary artery disease 07/07/2013   Severe obesity (BMI >= 40) ([place]) 05/18/2013   Neuropathy 83/66/2947   Diastolic heart failure ([place]) 10/16/2012   Essential hypertension 10/16/2012    Conditions to be addressed/monitored per PCP order:  Anxiety  Care Plan : General Social Work (Adult)  Updates made by Greg Cutter, LCSW since 07/17/2022 12:00 AM  Problem: Anxiety Identification (Anxiety)      Long-Range Goal: Anxiety Symptoms Identified   Start Date: 05/16/2021  Priority: High  Note:    Timeframe:  Long-Range Goal Priority:  High Start Date:    07/09/22                 Expected End Date:   ongoing                     Follow Up Date- 07/29/22 at 230 pm  Current barriers:   Chronic Mental Health needs related to depression Mental Health Concerns  and Social Isolation Needs Support, Education, and Care Coordination in order to meet unmet mental health needs. Clinical Goal(s): demonstrate a reduction in symptoms related to :Depression: loss of energy/fatigue feelings of worthlessness, guilt  verbalize understanding of plan for management of Depression   Clinical Interventions:  Assessed patient's previous and current treatment, coping skills, support system and barriers to care  Depression screen reviewed  Active listening / Reflection utilized  Behavioral Activation reviewed Provided brief CBT  Suicidal Ideation/Homicidal Ideation assessed: Discussed Guardianship and reviewed process  [place] of Attorney  Discussed referral for psychiatry , patient does not wish to pursue a referral for psychiatry at this time Made referral to [facility] for counseling on 07/09/22 and email was sent to patient today  as well with mental health resources Healthy self-care education was provided during 07/17/22 session due to patient being sick this past week [facility] LCSW 07/17/22 update- Patient reports that she has already received a call from [facility] regarding the therapy referral [facility] LCSW made. She said she missed this call due to being sick and will make sure to contact them back for scheduling soon. Patient was encouraged to drink a lot of fluids to help hydrate her body and she was encouraged to get some rest.   Review resources, discussed options and provided patient information about  Options for mental health treatment based on need and insurance  Inter-disciplinary care team collaboration (see longitudinal plan of care) Patient Goals/Self-Care Activities: Over the next 120 days REFERRAL PLACED I have placed a referral for counseling at [facility] and they will contact you.  If you have not heard from them in for counseling at [facility] and  please follow up.    The following coping skill education was provided for stress relief and mental health management: "When your car dies or a deadline looms, how do you respond? Long-term, low-grade or acute stress takes a serious toll on your body and mind, so don't ignore feelings of constant tension. Stress is a natural part of life. However, too much stress can harm our health, especially if it continues every day. This is chronic stress and can put you at risk for heart problems like heart disease and depression. Understand what's happening inside your body and learn simple coping skills to combat the negative impacts of everyday stressors.  Types of Stress There are two types of stress: Emotional - types of emotional stress are relationship problems, pressure at work, financial worries, experiencing discrimination or having a major life change. Physical - Examples of physical stress include being sick having pain, not sleeping well, recovery  from an injury or having an alcohol and drug use disorder. Fight or Flight Sudden or ongoing stress activates your nervous system and floods your bloodstream with adrenaline and cortisol, two hormones that raise blood pressure, increase heart rate and spike blood sugar.  These changes pitch your body into a fight or flight response. That enabled our ancestors to outrun saber-toothed tigers, and it's helpful today for situations like dodging a car accident. But most modern chronic stressors, such as finances or a challenging relationship, keep your body in that heightened state, which hurts your health. Effects of Too Much Stress If constantly under stress, most of [place] will eventually start to function less well.  Multiple studies link chronic stress to a higher risk of heart disease, stroke, depression, weight gain, memory loss and even premature death, so it's important to recognize the warning signals. Talk to your doctor about ways to manage stress if you're experiencing any of these symptoms: Prolonged periods of poor sleep. Regular, severe headaches. Unexplained weight loss or gain. Feelings of isolation, withdrawal or worthlessness. Constant anger and irritability. Loss of interest in activities. Constant worrying or obsessive thinking. Excessive alcohol or drug use. Inability to concentrate.  10 Ways to Cope with Chronic Stress It's key to recognize stressful situations as they occur because it allows you to focus on managing how you react. We all need to know when to close our eyes and take a deep breath when we feel tension rising. Use these tips to prevent or reduce chronic stress. 1. Rebalance Work and Home All work and no play? If you're spending too much time at the office, intentionally put more dates in your calendar to enjoy time for fun, either alone or with others. 2. Get Regular Exercise Moving your body on a regular basis balances the nervous system and increases blood  circulation, helping to flush out stress hormones. Even a daily 20-minute walk makes a difference. Any kind of exercise can lower stress and improve your mood ? just pick activities that you enjoy and make it a regular habit. 3. Eat Well and Limit Alcohol and Stimulants Alcohol, nicotine and caffeine may temporarily relieve stress but have negative health impacts and can make stress worse in the long run. Well-nourished bodies cope better, so start with a good breakfast, add more organic fruits and vegetables for a well-balanced diet, avoid processed foods and sugar, try herbal tea and drink more water. 4. Connect with Supportive People Talking face to face with another person releases hormones that reduce stress. Lean on those good listeners in your life. 5. [place] Time Do you enjoy gardening, reading, listening to music or some other creative pursuit? Engage in activities that bring you pleasure and joy; research shows that reduces stress by almost half and lowers your heart rate, too. 6. Practice Meditation, Stress Reduction or Yoga Relaxation techniques activate a state of restfulness that counterbalances your body's fight-or-flight hormones. Even if this also means a 10-minute break in a long day: listen to music, read, go for a walk in nature, do a hobby, take a bath or spend time with a friend. Also consider doing a mindfulness exercise or try a daily deep breathing or imagery practice. Deep Breathing Slow, calm and deep breathing can help you relax. Try these steps to focus on your breathing and repeat as needed. Find a comfortable position and close your eyes. Exhale and drop your shoulders. Breathe in through your nose; fill your lungs and then your belly. Think of relaxing your body, quieting your mind and becoming calm and peaceful. Breathe out slowly through your nose, relaxing your belly. Think of releasing tension, pain, worries or distress. Repeat steps three and four until  you feel relaxed. Imagery This involves using  your mind to excite the senses -- sound, vision, smell, taste and feeling. This may help ease your stress. Begin by getting comfortable and then do some slow breathing. Imagine a place you love being at. It could be somewhere from your childhood, somewhere you vacationed or just a place in your imagination. Feel how it is to be in the place you're imagining. Pay attention to the sounds, air, colors, and who is there with you. This is a place where you feel cared for and loved. All is well. You are safe. Take in all the smells, sounds, tastes and feelings. As you do, feel your body being nourished and healed. Feel the calm that surrounds you. Breathe in all the good. Breathe out any discomfort or tension. 7. Sleep Enough If you get less than seven to eight hours of sleep, your body won't tolerate stress as well as it could. If stress keeps you up at night, address the cause, and add extra meditation into your day to make up for the lost z's. Try to get seven to nine hours of sleep each night. Make a regular bedtime schedule. Keep your room dark and cool. Try to avoid computers, TV, cell phones and tablets before bed. 8. Edge with Connections You Enjoy Go out for a coffee with a friend, chat with a neighbor, call a family member, visit with a clergy member, or even hang out with your pet. Clinical studies show that spending even a short time with a companion animal can cut anxiety levels almost in half. 9. Take a Vacation Getting away from it all can reset your stress tolerance by increasing your mental and emotional outlook, which makes you a happier, more productive person upon return. Leave your cellphone and laptop at home! 10. See a Counselor, Coach or Therapist If negative thoughts overwhelm your ability to make positive changes, it's time to seek professional help. Make an appointment today--your health and life are worth it."        Follow  up:  Patient agrees to Care Plan and Follow-up.  Plan: The Managed Medicaid care management team will reach out to the patient again over the next 30 days.  Date/time of next scheduled Social Work care management/care coordination outreach:  07/29/22 at 230 pm  Eula Fried, [place], MSW, [place] Medicaid LCSW [email or web address] Phone: [phone]

## 2022-07-23 NOTE — Progress Notes (Signed)
   Care Guide Note  07/23/2022 Name: Maria Hudson MRN: [id number] DOB: 1960-12-01  Referred by: Nolene Ebbs, MD Reason for referral : Care Coordination (Outreach to reschedule missed f/u with pharm d in Late March )   Maria Hudson is a 62 y.o. year old female who is a primary care patient of Nolene Ebbs, MD. Maria Hudson was referred to the pharmacist for assistance related to DM.    A second unsuccessful telephone outreach was attempted today to contact the patient who was referred to the pharmacy team for assistance with medication management. Additional attempts will be made to contact the patient.  Noreene Larsson, [place],  [id number] Direct Dial: [phone] [email or web address]

## 2022-07-28 ENCOUNTER — Telehealth: Payer: Self-pay

## 2022-07-28 NOTE — Progress Notes (Signed)
   Care Guide Note  07/28/2022 Name: Maria Hudson MRN: [id number] DOB: 08-22-1960  Referred by: Nolene Ebbs, MD Reason for referral : Care Coordination (Outreach to reschedule with pharm d )   Maria Hudson is a 62 y.o. year old female who is a primary care patient of Nolene Ebbs, MD. Maria Hudson was referred to the pharmacist for assistance related to DM.    A third unsuccessful telephone outreach was attempted today to contact the patient who was referred to the pharmacy team for assistance with medication management. The Population Health team is pleased to engage with this patient at any time in the future upon receipt of referral and should he/she be interested in assistance from the [facility] team.   Maria Hudson, Maria Hudson, Maria Hudson [id number] Direct Dial: [phone] [email or web address]

## 2022-07-29 ENCOUNTER — Other Ambulatory Visit: Payer: Medicaid Other | Admitting: Licensed Clinical Social Worker

## 2022-07-29 NOTE — Patient Outreach (Signed)
Medicaid Managed Care Social Work Note  07/29/2022 Name:  Rockell Arbuthnot MRN:  [id number] DOB:  08/22/1960  Meliha Steady is an 62 y.o. year old female who is a primary patient of Nolene Ebbs, MD.  The Medicaid Managed Care Coordination team was consulted for assistance with:  [place] [facility]  Ms. Reisig was given information about Medicaid Managed Care Coordination team services today. [name] Patient agreed to services and verbal consent obtained.  Engaged with patient  for by telephone forfollow up visit in response to referral for case management and/or care coordination services.   Assessments/Interventions:  Review of past medical history, allergies, medications, health status, including review of consultants reports, laboratory and other test data, was performed as part of comprehensive evaluation and provision of chronic care management services.  SDOH: (Social Determinant of Health) assessments and interventions performed: SDOH Interventions    Flowsheet Row Patient Outreach Telephone from 07/29/2022 in [place] Patient Outreach Telephone from 07/17/2022 in [place] Patient Outreach Telephone from 07/09/2022 in [place] Patient Outreach Telephone from 07/07/2022 in [place] Patient Outreach Telephone from 05/01/2022 in [place] Patient Outreach Telephone from 04/01/2022 in [place] Coordination  SDOH Interventions        Food Insecurity Interventions -- -- -- -- Intervention Not Indicated --  Housing Interventions -- -- -- Intervention Not Indicated -- --  Alcohol Usage Interventions -- -- -- -- Intervention Not Indicated (Score <7) --  Depression Interventions/Treatment  -- -- Counseling, Medication Medication  [LCSW referral] -- --  Physical Activity Interventions -- --  -- -- -- Other (Comments)  [patient not physically able to]  Stress Interventions [place], Provide Counseling  [Patient reports being sick] [employer], Provide Counseling -- -- --       Advanced Directives Status:  See Care Plan for related entries.  Care Plan                 Allergies  Allergen Reactions   Metformin And Related Nausea Only   Other     Clonidine patch causes skin irritation    Hydrocodone Itching    Patient able to tolerate when taken with Benadryl.   Oxycodone-Acetaminophen Itching    Patient able to tolerate when taken with Benadryl.    Medications Reviewed Today     Reviewed by Greg Cutter, LCSW (Social Worker) on 07/09/22 at 1128  Med List Status: <None>   Medication Order Taking? Sig Documenting Provider Last Dose Status Informant  Accu-Chek Softclix Lancets lancets [id number] No 3 (three) times daily. [provider] Taking Active Self  acetaminophen (TYLENOL) 500 MG tablet [id number] No Take 1,000 mg by mouth every 6 (six) hours as needed for moderate pain or headache. [provider] Taking Active Self  ACIDOPHILUS LACTOBACILLUS PO [id number] No Take 1 Capful by mouth daily. [provider] Taking Active Self  allopurinol (ZYLOPRIM) 100 MG tablet [id number] No Take 100 mg by mouth in the morning. [provider] Taking Active Self  amLODipine (NORVASC) 10 MG tablet [id number] No Take 10 mg by mouth daily. [provider] Taking Active   aspirin EC 81 MG tablet [id number] No Take 81 mg by mouth in the morning. Swallow whole. [provider] Taking Active Self           Med Note (  [name]   Mon Apr 14, 2021  9:22 PM)    atorvastatin (LIPITOR) 40 MG tablet [id number] No Take 1 tablet (40 mg total) by mouth every evening. Skeet Latch, MD Taking Active Self  buPROPion [facility]) 100 MG  tablet [id number] No Take 100 mg by mouth 2 (two) times daily. [provider] Taking Active Self  carvedilol (COREG) 25 MG tablet [id number] No Take 1 tablet (25 mg total) by mouth 2 (two) times daily with a meal. [name], [name], MD Taking Expired 05/05/22 2359 Self  cholecalciferol (VITAMIN D3) 25 MCG (1000 UNIT) tablet [id number] No Take 2,000 Units by mouth daily. [provider] Taking Active Self  cloNIDine (CATAPRES) 0.3 MG tablet [id number] No Take 0.3 mg by mouth 3 (three) times daily. [provider] Taking Active Self           Med Note [facility] [place]   Tue Mar 03, 2022  9:23 AM)    dapagliflozin propanediol (FARXIGA) 10 MG TABS tablet [id number]  Take 1 tablet (10 mg total) by mouth daily before breakfast. Rafael Bihari, FNP  Active   diclofenac Sodium (VOLTAREN) 1 % GEL [id number] No Apply 2 g topically 4 (four) times daily as needed (chest pain.). [provider] Taking Active Self  ferrous sulfate 325 (65 FE) MG tablet [id number] No Take 325 mg by mouth 2 (two) times daily. [provider] Taking Active Self  fluticasone (FLONASE) 50 MCG/ACT nasal spray [id number] No Place 1 spray into both nostrils daily as needed for allergies or rhinitis. [provider] Taking Active Self  hydrALAZINE (APRESOLINE) 100 MG tablet [id number]  TAKE 1 TABLET BY MOUTH 3 TIMES DAILY. Skeet Latch, MD  Active   insulin glargine (LANTUS) 100 unit/mL SOPN [id number] No Inject 5 Units into the skin at bedtime. [provider] Taking Active Self           Med Note Stanford [place]   Tue Aug 19, 2021  9:37 AM)    isosorbide mononitrate (IMDUR) 60 MG 24 hr tablet [id number] No Take 1.5 tablets (90 mg total) by mouth daily. Canova, Maricela Bo, FNP Taking Active Self  linaclotide Rolan Lipa) 145 MCG CAPS capsule [id number] No Take 1 capsule (145 mcg total) by mouth daily before breakfast. Zehr, Laban Emperor, PA-C Taking Active   loratadine  (CLARITIN) 10 MG tablet [id number] No Take 10 mg by mouth in the morning. [provider] Taking Active Self  Multiple Vitamin (MULTIVITAMIN ADULT) TABS [id number] No Take 1 tablet by mouth in the morning. [provider] Taking Active Self  nitroGLYCERIN (NITROSTAT) 0.4 MG SL tablet [id number] No Place 1 tablet (0.4 mg total) under the tongue every 5 (five) minutes as needed for chest pain. [name], [name], MD Taking Active Self           Med Note Gilman Buttner, Juanita Laster   Tue Aug 19, 2021  9:35 AM)    Cira Servant FLEXPEN 100 UNIT/ML FlexPen [id number] No Inject 3 Units into the skin 3 (three) times daily with meals. [provider] Taking Active Self           Med Note (ADSIDE, Tiney Rouge   Fri Apr 17, 2022  8:30 AM)    Omega-3 Fatty Acids (FISH OIL) 1200 MG CAPS [id number] No Take 1,200 mg by mouth in the morning. [provider] Taking Active Self  Polyethylene Glycol 3350 (MIRALAX PO) [id number] No Take by mouth daily as needed. [provider] Taking Active  potassium chloride SA (KLOR-CON M) 20 MEQ tablet [id number] No Take 20 mEq by mouth daily. [provider] Taking Active            Med Note Jodi Mourning, Toney Reil Apr 13, 2022  9:26 AM) Dewaine Conger ~ 3 times weekly, on days she takes torsemide 40 mg   pregabalin (LYRICA) 25 MG capsule [id number] No Take 25 mg by mouth 2 (two) times daily. [provider] Taking Active Self  Semaglutide, 1 MG/DOSE, (OZEMPIC, 1 MG/DOSE,) 4 MG/3ML SOPN [id number] No Inject 1 mg into the skin once a week. Buford Dresser, MD Taking Active   sertraline (ZOLOFT) 50 MG tablet [id number] No Take 50 mg by mouth in the morning. [provider] Taking Active Self  spironolactone (ALDACTONE) 25 MG tablet [id number] No Take 25 mg by mouth daily. [provider] Taking Active   torsemide (DEMADEX) 20 MG tablet [id number]  Take 1 tablet (20 mg total) by mouth daily as needed. [place], [place], [place]   Active   traZODone (DESYREL) 50 MG tablet [id number] No Take 50 mg by mouth at bedtime. [provider] Taking Active Self  TURMERIC CURCUMIN PO [id number] No Take by mouth. [provider] Taking Active             Patient Active Problem List   Diagnosis Date Noted   Chronic constipation 03/31/2022   Pain due to onychomycosis of toenails of both feet 12/05/2021   Pure hypercholesterolemia 05/27/2021   Chronic diastolic CHF (congestive heart failure) ([place]) 04/29/2021   Hypertensive urgency 04/14/2021   Acute on chronic diastolic CHF (congestive heart failure) ([place]) 04/14/2021   Chest pain 03/05/2021   CAD (coronary artery disease) 03/05/2021   T2DM (type 2 diabetes mellitus) ([place]) 03/05/2021   CKD (chronic kidney disease) stage 4, GFR 15-29 ml/min ([place]) 03/05/2021   Class 3 obesity ([place]) 03/05/2021   CVA (cerebral vascular accident) ([place]) 03/05/2021   Acute on chronic diastolic heart failure ([place]) 03/05/2021   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   AKI (acute kidney injury) ([place]) 03/05/2021   Vitreous hemorrhage of left eye ([place]) 11/14/2020   Orthopnea 08/06/2020   Vision loss of right eye 08/06/2020   Excessive daytime sleepiness 01/11/2020   Iron deficiency anemia 12/14/2019   Vitreous hemorrhage, right ([place]) 12/04/2019   Diabetic neuropathy ([place]) 07/26/2019   Proliferative diabetic retinopathy of both 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 ([place]) 02/03/2019   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   Conductive hearing loss, bilateral 03/03/2018   Chronic bilateral low back pain without sciatica 08/21/2015   Chronic kidney disease (CKD) stage G3b/A3, moderately decreased glomerular filtration rate (GFR) between 30-44 mL/min/1.73 square meter and albuminuria creatinine ratio greater than 300 mg/g ([place]) 04/30/2015   Type 2 diabetes mellitus with diabetic polyneuropathy, with long-term current use of insulin ([place]) 02/15/2015   Advance  directive on file 05/17/2014   Refusal of blood transfusions as patient is Jehovah's Witness 05/17/2014   OSA treated with BiPAP 02/23/2014   Coronary artery disease 07/07/2013   Severe obesity (BMI >= 40) ([place]) 05/18/2013   Neuropathy [id number]   Diastolic heart failure ([place]) 10/16/2012   Essential hypertension 10/16/2012    Conditions to be addressed/monitored per PCP order:  Anxiety  Care Plan : General Social Work (Adult)  Updates made by Greg Cutter, LCSW since 07/29/2022 12:00 AM     Problem: Anxiety Identification (Anxiety)      Long-Range Goal: Anxiety Symptoms Identified  Start Date: 05/16/2021  Priority: High  Note:    Timeframe:  Long-Range Goal Priority:  High Start Date:    07/09/22                 Expected End Date:   ongoing                     Follow Up Date- 08/12/22 at 130 pm  Current barriers:   Chronic Mental Health needs related to depression Mental Health Concerns  and Social Isolation Needs Support, Education, and Care Coordination in order to meet unmet mental health needs. Clinical Goal(s): demonstrate a reduction in symptoms related to :Depression: loss of energy/fatigue feelings of worthlessness, guilt  verbalize understanding of plan for management of Depression   Clinical Interventions:  Assessed patient's previous and current treatment, coping skills, support system and barriers to care  Depression screen reviewed  Active listening / Reflection utilized  Behavioral Activation reviewed Provided brief CBT  Suicidal Ideation/Homicidal Ideation assessed: Discussed Guardianship and reviewed process  [place] of Attorney  Discussed referral for psychiatry , patient does not wish to pursue a referral for psychiatry at this time Made referral to [facility] for counseling on 07/09/22 and email was sent to patient today as well with mental health resources Healthy self-care education was provided during 07/17/22 session due  to patient being sick this past week [facility] LCSW 07/17/22 update- Patient reports that she has already received a call from [facility] regarding the therapy referral [facility] LCSW made. She said she missed this call due to being sick and will make sure to contact them back for scheduling soon. Patient was encouraged to drink a lot of fluids to help hydrate her body and she was encouraged to get some rest. 07/29/22 Update- Patient reports that she is playing phone tag with [facility] but they are both trying to reach each other and have not been able to do so but she will return their last call and will notify [facility] LCSW if she continues to have issues. Patient reports that she is working actively on fighting her negative feelings and emotions in order to live her life more positively.   Review resources, discussed options and provided patient information about  Options for mental health treatment based on need and insurance  Inter-disciplinary care team collaboration (see longitudinal plan of care) Patient Goals/Self-Care Activities: Over the next 120 days REFERRAL PLACED I have placed a referral for counseling at [facility] and they will contact you.  If you have not heard from them in for counseling at [facility] and  please follow up.    The following coping skill education was provided for stress relief and mental health management: "When your car dies or a deadline looms, how do you respond? Long-term, low-grade or acute stress takes a serious toll on your body and mind, so don't ignore feelings of constant tension. Stress is a natural part of life. However, too much stress can harm our health, especially if it continues every day. This is chronic stress and can put you at risk for heart problems like heart disease and depression. Understand what's happening inside your body and learn simple coping skills to combat the negative impacts of everyday stressors.  Types of Stress There  are two types of stress: Emotional - types of emotional stress are relationship problems, pressure at work, financial worries, experiencing discrimination or having a major life change. Physical - Examples of  physical stress include being sick having pain, not sleeping well, recovery from an injury or having an alcohol and drug use disorder. Fight or Flight Sudden or ongoing stress activates your nervous system and floods your bloodstream with adrenaline and cortisol, two hormones that raise blood pressure, increase heart rate and spike blood sugar. These changes pitch your body into a fight or flight response. That enabled our ancestors to outrun saber-toothed tigers, and it's helpful today for situations like dodging a car accident. But most modern chronic stressors, such as finances or a challenging relationship, keep your body in that heightened state, which hurts your health. Effects of Too Much Stress If constantly under stress, most of [place] will eventually start to function less well.  Multiple studies link chronic stress to a higher risk of heart disease, stroke, depression, weight gain, memory loss and even premature death, so it's important to recognize the warning signals. Talk to your doctor about ways to manage stress if you're experiencing any of these symptoms: Prolonged periods of poor sleep. Regular, severe headaches. Unexplained weight loss or gain. Feelings of isolation, withdrawal or worthlessness. Constant anger and irritability. Loss of interest in activities. Constant worrying or obsessive thinking. Excessive alcohol or drug use. Inability to concentrate.  10 Ways to Cope with Chronic Stress It's key to recognize stressful situations as they occur because it allows you to focus on managing how you react. We all need to know when to close our eyes and take a deep breath when we feel tension rising. Use these tips to prevent or reduce chronic stress. 1. Rebalance Work and  Home All work and no play? If you're spending too much time at the office, intentionally put more dates in your calendar to enjoy time for fun, either alone or with others. 2. Get Regular Exercise Moving your body on a regular basis balances the nervous system and increases blood circulation, helping to flush out stress hormones. Even a daily 20-minute walk makes a difference. Any kind of exercise can lower stress and improve your mood ? just pick activities that you enjoy and make it a regular habit. 3. Eat Well and Limit Alcohol and Stimulants Alcohol, nicotine and caffeine may temporarily relieve stress but have negative health impacts and can make stress worse in the long run. Well-nourished bodies cope better, so start with a good breakfast, add more organic fruits and vegetables for a well-balanced diet, avoid processed foods and sugar, try herbal tea and drink more water. 4. Connect with Supportive People Talking face to face with another person releases hormones that reduce stress. Lean on those good listeners in your life. 5. Lloyd Time Do you enjoy gardening, reading, listening to music or some other creative pursuit? Engage in activities that bring you pleasure and joy; research shows that reduces stress by almost half and lowers your heart rate, too. 6. Practice Meditation, Stress Reduction or Yoga Relaxation techniques activate a state of restfulness that counterbalances your body's fight-or-flight hormones. Even if this also means a 10-minute break in a long day: listen to music, read, go for a walk in nature, do a hobby, take a bath or spend time with a friend. Also consider doing a mindfulness exercise or try a daily deep breathing or imagery practice. Deep Breathing Slow, calm and deep breathing can help you relax. Try these steps to focus on your breathing and repeat as needed. Find a comfortable position and close your eyes. Exhale and drop your shoulders. Breathe in  through your nose; fill your lungs and then your belly. Think of relaxing your body, quieting your mind and becoming calm and peaceful. Breathe out slowly through your nose, relaxing your belly. Think of releasing tension, pain, worries or distress. Repeat steps three and four until you feel relaxed. Imagery This involves using your mind to excite the senses -- sound, vision, smell, taste and feeling. This may help ease your stress. Begin by getting comfortable and then do some slow breathing. Imagine a place you love being at. It could be somewhere from your childhood, somewhere you vacationed or just a place in your imagination. Feel how it is to be in the place you're imagining. Pay attention to the sounds, air, colors, and who is there with you. This is a place where you feel cared for and loved. All is well. You are safe. Take in all the smells, sounds, tastes and feelings. As you do, feel your body being nourished and healed. Feel the calm that surrounds you. Breathe in all the good. Breathe out any discomfort or tension. 7. Sleep Enough If you get less than seven to eight hours of sleep, your body won't tolerate stress as well as it could. If stress keeps you up at night, address the cause, and add extra meditation into your day to make up for the lost z's. Try to get seven to nine hours of sleep each night. Make a regular bedtime schedule. Keep your room dark and cool. Try to avoid computers, TV, cell phones and tablets before bed. 8. Coe with Connections You Enjoy Go out for a coffee with a friend, chat with a neighbor, call a family member, visit with a clergy member, or even hang out with your pet. Clinical studies show that spending even a short time with a companion animal can cut anxiety levels almost in half. 9. Take a Vacation Getting away from it all can reset your stress tolerance by increasing your mental and emotional outlook, which makes you a happier, more productive person  upon return. Leave your cellphone and laptop at home! 10. See a Counselor, Coach or Therapist If negative thoughts overwhelm your ability to make positive changes, it's time to seek professional help. Make an appointment today--your health and life are worth it."        Follow up:  Patient agrees to Care Plan and Follow-up.  Plan: The Managed Medicaid care management team will reach out to the patient again over the next 30 days.  Date/time of next scheduled Social Work care management/care coordination outreach:  08/12/22 at 130 pm  Eula Fried, [place], MSW, [place] Medicaid LCSW [email or web address] Phone: [phone]

## 2022-07-29 NOTE — Patient Instructions (Signed)
Visit Information  Maria Hudson was given information about Medicaid Managed Care team care coordination services as a part of their [facility] Medicaid benefit. Maria Hudson verbally consented to engagement with the [facility] Managed Care team.   If you are experiencing a medical emergency, please call 911 or report to your local emergency department or urgent care.   If you have a non-emergency medical problem during routine business hours, please contact your provider's office and ask to speak with a nurse.   For questions related to your [facility] health plan, please call: [phone] or go here:[email or web address]  If you would like to schedule transportation through your [facility] plan, please call the following number at least 2 days in advance of your appointment: [phone].  You can also use the MTM portal or MTM mobile app to manage your rides. For the portal, please go to [email or web address].  Call the [place] at [phone], at any time, 24 hours a day, 7 days a week. If you are in danger or need immediate medical attention call 911.  If you would like help to quit smoking, call 1-800-QUIT-NOW 309-813-9178) OR Espaol: 1-855-Djelo-Ya HD:1601594) o para ms informacin haga clic aqu or Text READY to 200-400 to register via text   Following is a copy of your plan of care:  Care Plan : General Social Work (Adult)  Updates made by Greg Cutter, LCSW since 07/29/2022 12:00 AM     Problem: Anxiety Identification (Anxiety)      Long-Range Goal: Anxiety Symptoms Identified   Start Date: 05/16/2021  Priority: High  Note:    Timeframe:  Long-Range Goal Priority:  High Start Date:    07/09/22                 Expected End Date:   ongoing                     Follow Up Date- 08/12/22 at 130 pm  Current barriers:   Chronic Mental Health needs related to depression Mental Health Concerns  and Social Isolation Needs Support,  Education, and Care Coordination in order to meet unmet mental health needs. Clinical Goal(s): demonstrate a reduction in symptoms related to :Depression: loss of energy/fatigue feelings of worthlessness, guilt  verbalize understanding of plan for management of Depression     Patient Goals/Self-Care Activities: Over the next 120 days REFERRAL PLACED I have placed a referral for counseling at [facility] and they will contact you.  If you have not heard from them in for counseling at [facility] and  please follow up.    The following coping skill education was provided for stress relief and mental health management: "When your car dies or a deadline looms, how do you respond? Long-term, low-grade or acute stress takes a serious toll on your body and mind, so don't ignore feelings of constant tension. Stress is a natural part of life. However, too much stress can harm our health, especially if it continues every day. This is chronic stress and can put you at risk for heart problems like heart disease and depression. Understand what's happening inside your body and learn simple coping skills to combat the negative impacts of everyday stressors.  Types of Stress There are two types of stress: Emotional - types of emotional stress are relationship problems, pressure at work, financial worries, experiencing discrimination or having a major life change. Physical - Examples of physical stress include being sick having pain, not sleeping  well, recovery from an injury or having an alcohol and drug use disorder. Fight or Flight Sudden or ongoing stress activates your nervous system and floods your bloodstream with adrenaline and cortisol, two hormones that raise blood pressure, increase heart rate and spike blood sugar. These changes pitch your body into a fight or flight response. That enabled our ancestors to outrun saber-toothed tigers, and it's helpful today for situations like dodging a car  accident. But most modern chronic stressors, such as finances or a challenging relationship, keep your body in that heightened state, which hurts your health. Effects of Too Much Stress If constantly under stress, most of [place] will eventually start to function less well.  Multiple studies link chronic stress to a higher risk of heart disease, stroke, depression, weight gain, memory loss and even premature death, so it's important to recognize the warning signals. Talk to your doctor about ways to manage stress if you're experiencing any of these symptoms: Prolonged periods of poor sleep. Regular, severe headaches. Unexplained weight loss or gain. Feelings of isolation, withdrawal or worthlessness. Constant anger and irritability. Loss of interest in activities. Constant worrying or obsessive thinking. Excessive alcohol or drug use. Inability to concentrate.  10 Ways to Cope with Chronic Stress It's key to recognize stressful situations as they occur because it allows you to focus on managing how you react. We all need to know when to close our eyes and take a deep breath when we feel tension rising. Use these tips to prevent or reduce chronic stress. 1. Rebalance Work and Home All work and no play? If you're spending too much time at the office, intentionally put more dates in your calendar to enjoy time for fun, either alone or with others. 2. Get Regular Exercise Moving your body on a regular basis balances the nervous system and increases blood circulation, helping to flush out stress hormones. Even a daily 20-minute walk makes a difference. Any kind of exercise can lower stress and improve your mood ? just pick activities that you enjoy and make it a regular habit. 3. Eat Well and Limit Alcohol and Stimulants Alcohol, nicotine and caffeine may temporarily relieve stress but have negative health impacts and can make stress worse in the long run. Well-nourished bodies cope better, so start with  a good breakfast, add more organic fruits and vegetables for a well-balanced diet, avoid processed foods and sugar, try herbal tea and drink more water. 4. Connect with Supportive People Talking face to face with another person releases hormones that reduce stress. Lean on those good listeners in your life. 5. [place] Time Do you enjoy gardening, reading, listening to music or some other creative pursuit? Engage in activities that bring you pleasure and joy; research shows that reduces stress by almost half and lowers your heart rate, too. 6. Practice Meditation, Stress Reduction or Yoga Relaxation techniques activate a state of restfulness that counterbalances your body's fight-or-flight hormones. Even if this also means a 10-minute break in a long day: listen to music, read, go for a walk in nature, do a hobby, take a bath or spend time with a friend. Also consider doing a mindfulness exercise or try a daily deep breathing or imagery practice. Deep Breathing Slow, calm and deep breathing can help you relax. Try these steps to focus on your breathing and repeat as needed. Find a comfortable position and close your eyes. Exhale and drop your shoulders. Breathe in through your nose; fill your lungs and then  your belly. Think of relaxing your body, quieting your mind and becoming calm and peaceful. Breathe out slowly through your nose, relaxing your belly. Think of releasing tension, pain, worries or distress. Repeat steps three and four until you feel relaxed. Imagery This involves using your mind to excite the senses -- sound, vision, smell, taste and feeling. This may help ease your stress. Begin by getting comfortable and then do some slow breathing. Imagine a place you love being at. It could be somewhere from your childhood, somewhere you vacationed or just a place in your imagination. Feel how it is to be in the place you're imagining. Pay attention to the sounds, air, colors, and who  is there with you. This is a place where you feel cared for and loved. All is well. You are safe. Take in all the smells, sounds, tastes and feelings. As you do, feel your body being nourished and healed. Feel the calm that surrounds you. Breathe in all the good. Breathe out any discomfort or tension. 7. Sleep Enough If you get less than seven to eight hours of sleep, your body won't tolerate stress as well as it could. If stress keeps you up at night, address the cause, and add extra meditation into your day to make up for the lost z's. Try to get seven to nine hours of sleep each night. Make a regular bedtime schedule. Keep your room dark and cool. Try to avoid computers, TV, cell phones and tablets before bed. 8. [place] with Connections You Enjoy Go out for a coffee with a friend, chat with a neighbor, call a family member, visit with a clergy member, or even hang out with your pet. Clinical studies show that spending even a short time with a companion animal can cut anxiety levels almost in half. 9. Take a Vacation Getting away from it all can reset your stress tolerance by increasing your mental and emotional outlook, which makes you a happier, more productive person upon return. Leave your cellphone and laptop at home! 10. See a Counselor, Coach or Therapist If negative thoughts overwhelm your ability to make positive changes, it's time to seek professional help. Make an appointment today--your health and life are worth it."

## 2022-08-04 ENCOUNTER — Other Ambulatory Visit: Payer: Medicaid Other | Admitting: Obstetrics and Gynecology

## 2022-08-04 ENCOUNTER — Encounter: Payer: Self-pay | Admitting: Obstetrics and Gynecology

## 2022-08-04 NOTE — Patient Instructions (Signed)
Hi Maria Hudson, great to speak with you today-have a nice day!  Maria Hudson was given information about Medicaid Managed Care team care coordination services as a part of their [facility] Medicaid benefit. Maria Hudson verbally consented to engagement with the [facility] Managed Care team.   If you are experiencing a medical emergency, please call 911 or report to your local emergency department or urgent care.   If you have a non-emergency medical problem during routine business hours, please contact your provider's office and ask to speak with a nurse.   For questions related to your [facility] health plan, please call: [phone] or go here:[email or web address]  If you would like to schedule transportation through your [facility] plan, please call the following number at least 2 days in advance of your appointment: [phone].  You can also use the MTM portal or MTM mobile app to manage your rides. For the portal, please go to [email or web address].  Call the [place] at [phone], at any time, 24 hours a day, 7 days a week. If you are in danger or need immediate medical attention call 911.  If you would like help to quit smoking, call 1-800-QUIT-NOW (417) 277-2279) OR Espaol: 1-855-Djelo-Ya HD:1601594) o para ms informacin haga clic aqu or Text READY to 200-400 to register via text  Maria Hudson - following are the goals we discussed in your visit today:   Goals Addressed    Timeframe:  Long-Range Goal Priority:  High Start Date:     11/12/21                        Expected End Date:  ongoing                     Follow Up Date: 09/03/22   - schedule appointment for vaccines needed due to my age or health - schedule recommended health tests (blood work, mammogram, colonoscopy, pap test) - schedule and keep appointment for annual check-up    Why is this important?   Screening tests can find diseases early when they are easier to treat.   Your doctor or nurse will talk with you about which tests are important for you.  Getting shots for common diseases like the flu and shingles will help prevent them.  08/04/22:  Ophthalmology follow up tomorrow, PCP and Nephrology in March  Patient verbalizes understanding of instructions and care plan provided today and agrees to view in [place]. Active MyChart status and patient understanding of how to access instructions and care plan via MyChart confirmed with patient.     The Managed Medicaid care management team will reach out to the patient again over the next 30 buisness  days.  The  Patient  has been provided with contact information for the Managed Medicaid care management team and has been advised to call with any health related questions or concerns.   Aida Raider RN, BSN [place] Management Coordinator - Managed Medicaid High Risk [phone]   Following is a copy of your plan of care:  Care Plan : [place]  Updates made by Gayla Medicus, RN since 08/04/2022 12:00 AM     Problem: Knowledge Deficit and Care Coordination Needs Related to Management of HTN, HF, Type 2 DM   Priority: High     Long-Range Goal: Development of Plan of Care to Address Knowledge Deficits and Care Coordination Needs related to  management of CHF, CAD, HTN, HLD, DMII, CKD Stage 4, and Depression   Start Date: 05/06/2021  Expected End Date: 10/07/2022  Priority: High  Note:   Current Barriers:  Knowledge Deficits related to plan of care for management of CHF, CAD, HTN, HLD, DMII, CKD Stage 4, and Depression 08/04/22:  Patient states she has blled right ey-has appt with Ophthalmologist tomorrow.  States she has gained 7 pounds this month-discussed nutritionist referral, [facility], and [facility] Well[facility]-patient states she will call.  LCSW following for depression-patient trying to get an appt with [facility] and states she will call  today.  Patient has PCP and Nephrology appts in March-to schedule ENDO.  Blood sugars 117-168.  BP stable.  RNCM Clinical Goal(s):  Patient will demonstrate ongoing adherence to prescribed treatment plan for CHF, CAD, HTN, HLD, DMII, CKD Stage 4, and Depression as evidenced by patient report of improved health and quality of life and no unplanned ED or unplanned hospital admissions continue to work with RN Care Manager and/or Social Worker to address care management and care coordination needs related to CHF, CAD, HTN, HLD, DMII, CKD Stage 4, and Depression as evidenced by adherence to CM Team Scheduled appointments      Work with [facility] LCSW and psychiatrist to treat depression Patient to schedule  an appt with ENDO  Interventions: Inter-disciplinary care team collaboration (see longitudinal plan of care) Evaluation of current treatment plan related to  self management and patient's adherence to plan as established by provider Mailed summary of benefits for her [place] Medicaid plan to her home address Collaborated with Care Guide for dental resources-completed. Care Guide referral for dental resources Collaborated with BSW for assistance with [facility] [place] appt.-completed. BSW referral for appt assistance. Collaborated with Pharmacy regarding  uncontrolled HTN Pharmacy referral for uncontrolled HTN-completed. Collaborated with LCSW for depression LCSW referral for depression-completed   Heart Failure Interventions:  (Status: Goal on Track (progressing): YES.)  Long Term Goal - patient states her fluid retention has significantly improved,  says she cannot tolerate the compression stockings because they are too tight and make her legs hurt Assessed patient's heart failure management strategies  Diabetes:  (Status: Goal on Track (progressing): YES.) Long Term Goal   Lab Results  Component Value Date   HGBA1C 6.7 (H) 04/29/2021   Hgn A1C=6.3 on 12/26/21 Hgb A1C=5.2 in October per  patient Assessed patient's understanding of A1C goal: <7% Reviewed medications with patient and discussed importance of medication adherence;        Counseled on importance of regular laboratory monitoring as prescribed;        Discussed plans with patient for ongoing care management follow up and provided patient with direct contact information for care management team;      Advised patient, providing education and rationale, to check cbg three times daily and record        call provider for findings outside established parameters;       Review of patient status, including review of consultants reports, relevant laboratory and other test results, and medications completed;       Discussed addition of Ozempic to medication regimen , reviewed mechanism of action and proven cardiovascular benefits To schedule ENDO appt-discussed nutrition referral, [facility], [facility]  Hypertension and CKD stage 4 : (Status: Goal on Track (progressing): YES.) Long Term Goal-  has been placed on kidney transplant waiting list, fistu;a placed 12/09/21,ready for use 03/11/22. Last practice recorded BP readings:  BP 04/01/22-145/87  BP 05/01/22-141/70 BP 08/04/22-155/74 BP Readings from Last 3 Encounters:  09/30/21 120/70  09/12/21 (!) 107/58  09/08/21 (!) 144/71     Most recent eGFR/CrCl:    No components found for: CRCL Lab Results  Component Value Date   NA 136 08/19/2021   K 3.9 08/19/2021   CREATININE 2.51 (H) 08/19/2021   EGFR 19 (L) 03/13/2021   GFRNONAA 22 (L) 08/19/2021   GLUCOSE 122 (H) 08/19/2021    Evaluation of current treatment plan related to hypertension self management and patient's adherence to plan as established by provider;   Reviewed medications with patient and discussed importance of compliance;  Discussed plans with patient for ongoing care management follow up and provided patient with direct contact information for care management team; Advised patient, providing  education and rationale, to monitor blood pressure daily and record, calling PCP for findings outside established parameters;  Reviewed scheduled/upcoming provider appointments  Patient with labile blood pressures, medication adjusted, patient to  f/u with nephrologist-appt in March, also PCP  Weight Loss Interventions:  (Status:  New goal.) Long Term Goal--patient has started Ozempic Discussed indications, mechanism of action, common side effects and CV benefits  of GLP1 Ozempic Assessed tolerance of Ozempic 08/04/22-patient states she has gained 7 pounds this month because she is not eating well-discussed nutrition referral, exercise, and [facility]-patient states she will check on these.  Interdisciplinary Collaboration:  (Status: Goal on Track (progressing): YES.) Long Term Goal  Collaborated with BSW to initiate plan of care to address needs related to Housing barriers, Inability to perform ADL's independently, and Inability to perform IADL's independently in patient with CHF, CAD, HTN, HLD, DM, CKD Stage 4, and Depression Assessed status of patient's involvement with behavioral health counselor Collaboration with pharmacist for medication review, discussion of ? Medication side effects causing chronic constipation and arranging for patient to received home delivery of medications in blister packs  Patient Goals/Self-Care Activities: Take medications as prescribed   Attend all scheduled provider appointments Call pharmacy for medication refills 3-7 days in advance of running out of medications Attend church or other social activities Perform all self care activities independently  Call provider office for new concerns or questions  Work with the social worker to address care coordination needs and will continue to work with the clinical team to address health care and disease management related needs call office if I gain more than 2 pounds in one day or 5 pounds in one  week keep legs up while sitting track weight in diary use salt in moderation watch for swelling in feet, ankles and legs every day weigh myself daily know when to call the doctorfor fluid weight gain or consistently abnormal BP readings at home track symptoms and what helps feel better or worse check blood sugar at prescribed times: three times daily enter blood sugar readings and medication or insulin into daily log take the blood sugar log to all doctor visits take the blood sugar meter to all doctor visits eat fish at least once per week fill half of plate with vegetables manage portion size check blood pressure daily write blood pressure results in a log or diary learn about high blood pressure take blood pressure log to all doctor appointments call doctor for signs and symptoms of high blood pressure keep all doctor appointments take medications for blood pressure exactly as prescribed Limit fluid intake to 1 liter per day Collaborate with managed Medicaid team of BSW and pharmacist to address identified needs  Work with cardiology pharmacist to assist with BP stabilization Work with [facility] LCSW and psychiatrist to treat depression Review summary of Wellcare benefits mailed to home address and call to enroll in program that will benefit your chronic health issues Continue to use [place] Management calendar to record weight , BP and CBGs so readings are all in one place Schedule dental appt Schedule ENDO appt

## 2022-08-04 NOTE — Patient Outreach (Signed)
Medicaid Managed Care   Nurse Care Manager Note  08/04/2022 Name:  Maria Hudson MRN:  [id number] DOB:  09/11/1960  Maria Hudson is an 62 y.o. year old female who is a primary patient of Maria Ebbs, MD.  The [facility] Managed Care Coordination team was consulted for assistance with:    Chronic healthcare management needs, neuropathy, HTN, retinopathy, CKD, DM, CAD, OSA, chronic pain, depression, CHF  [name] was given information about Medicaid Managed Care Coordination team services today. Maria Hudson Patient agreed to services and verbal consent obtained.  [name] for follow up visit in response to provider referral for case management and/or care coordination services.   Assessments/Interventions:  Review of past medical history, allergies, medications, health status, including review of consultants reports, laboratory and other test data, was performed as part of comprehensive evaluation and provision of chronic care management services.  SDOH (Social Determinants of Health) assessments and interventions performed: SDOH Interventions    Flowsheet Row Patient Outreach Telephone from 08/04/2022 in [place] Patient Outreach Telephone from 07/29/2022 in [place] Patient Outreach Telephone from 07/17/2022 in [place] Patient Outreach Telephone from 07/09/2022 in [place] Patient Outreach Telephone from 07/07/2022 in [place] Patient Outreach Telephone from 05/01/2022 in [place]  SDOH Interventions        Food Insecurity Interventions -- -- -- -- -- Intervention Not Indicated  Housing Interventions -- -- -- -- Intervention Not Indicated --  Transportation Interventions Intervention Not Indicated -- -- -- -- --  Alcohol Usage Interventions -- -- -- -- -- Intervention Not  Indicated (Score <7)  Depression Interventions/Treatment  -- -- -- Counseling, Medication Medication  [LCSW referral] --  Financial Strain Interventions Intervention Not Indicated -- -- -- -- --  Stress Interventions -- Maria Hudson, Provide Counseling Offered Maria Hudson, Provide Counseling  [Patient reports being sick] Maria Hudson, Provide Counseling -- --     Care Plan  Allergies  Allergen Reactions   Metformin And Related Nausea Only   Other     Clonidine patch causes skin irritation    Hydrocodone Itching    Patient able to tolerate when taken with Benadryl.   Oxycodone-Acetaminophen Itching    Patient able to tolerate when taken with Benadryl.   Medications Reviewed Today     Reviewed by Maria Medicus, RN (Registered Nurse) on 08/04/22 at 1112  Med List Status: <None>   Medication Order Taking? Sig Documenting Provider Last Dose Status Informant  Accu-Chek Softclix Lancets lancets [id number] No 3 (three) times daily. [provider] Taking Active Self  acetaminophen (TYLENOL) 500 MG tablet [id number] No Take 1,000 mg by mouth every 6 (six) hours as needed for moderate pain or headache. [provider] Taking Active Self  ACIDOPHILUS LACTOBACILLUS PO [id number] No Take 1 Capful by mouth daily. [provider] Taking Active Self  allopurinol (ZYLOPRIM) 100 MG tablet [id number] No Take 100 mg by mouth in the morning. [provider] Taking Active Self  amLODipine (NORVASC) 10 MG tablet [id number] No Take 10 mg by mouth daily. [provider] Taking Active   aspirin EC 81 MG tablet [id number] No Take 81 mg by mouth in the morning. Swallow whole. [provider] Taking Active Self           Med Note Fransico Meadow Apr 14, 2021  9:22 PM)    atorvastatin (LIPITOR) 40 MG tablet [id number] No Take 1 tablet (40 mg total) by mouth every evening. Skeet Latch, MD  Taking Active Self  buPROPion [place]) 100 MG tablet [id number] No Take 100 mg by mouth 2 (two) times daily. [provider] Taking Active Self  carvedilol (COREG) 25 MG tablet [id number] No Take 1 tablet (25 mg total) by mouth 2 (two) times daily with a meal. [name], [name], MD Taking Expired 05/05/22 2359 Self  cholecalciferol (VITAMIN D3) 25 MCG (1000 UNIT) tablet [id number] No Take 2,000 Units by mouth daily. [provider] Taking Active Self  cloNIDine (CATAPRES) 0.3 MG tablet [id number] No Take 0.3 mg by mouth 3 (three) times daily. [provider] Taking Active Self           Med Note [facility] [place]   Tue Mar 03, 2022  9:23 AM)    dapagliflozin propanediol (FARXIGA) 10 MG TABS tablet [id number]  Take 1 tablet (10 mg total) by mouth daily before breakfast. Rafael Bihari, FNP  Active   diclofenac Sodium (VOLTAREN) 1 % GEL [id number] No Apply 2 g topically 4 (four) times daily as needed (chest pain.). [provider] Taking Active Self  ferrous sulfate 325 (65 FE) MG tablet [id number] No Take 325 mg by mouth 2 (two) times daily. [provider] Taking Active Self  fluticasone (FLONASE) 50 MCG/ACT nasal spray [id number] No Place 1 spray into both nostrils daily as needed for allergies or rhinitis. [provider] Taking Active Self  hydrALAZINE (APRESOLINE) 100 MG tablet [id number]  TAKE 1 TABLET BY MOUTH 3 TIMES DAILY. Skeet Latch, MD  Active   insulin glargine (LANTUS) 100 unit/mL SOPN [id number] No Inject 5 Units into the skin at bedtime. [provider] Taking Active Self           Med Note [facility] [place]   Tue Aug 19, 2021  9:37 AM)    isosorbide mononitrate (IMDUR) 60 MG 24 hr tablet [id number] No Take 1.5 tablets (90 mg total) by mouth daily. Wheeling, Maricela Bo, FNP Taking Active Self  linaclotide Rolan Lipa) 145 MCG CAPS capsule [id number] No Take 1 capsule (145 mcg total) by mouth daily before breakfast.  Zehr, Laban Emperor, PA-C Taking Active   loratadine (CLARITIN) 10 MG tablet [id number] No Take 10 mg by mouth in the morning. [provider] Taking Active Self  Multiple Vitamin (MULTIVITAMIN ADULT) TABS [id number] No Take 1 tablet by mouth in the morning. [provider] Taking Active Self  nitroGLYCERIN (NITROSTAT) 0.4 MG SL tablet [id number] No Place 1 tablet (0.4 mg total) under the tongue every 5 (five) minutes as needed for chest pain. [name], [name], MD Taking Active Self           Med Note Gilman Buttner, Juanita Laster   Tue Aug 19, 2021  9:35 AM)    Cira Servant FLEXPEN 100 UNIT/ML FlexPen [id number] No Inject 3 Units into the skin 3 (three) times daily with meals. [provider] Taking Active Self           Med Note (ADSIDE, Tiney [place]   Fri Apr 17, 2022  8:30 AM)    Omega-3 Fatty Acids (FISH OIL) 1200 MG CAPS [id number] No Take 1,200 mg by mouth in the morning. [provider] Taking Active Self  Polyethylene Glycol 3350 (MIRALAX PO) [id number] No Take by mouth daily as needed. [provider] Taking Active   potassium chloride SA (KLOR-CON M) 20 MEQ tablet  [id number] No Take 20 mEq by mouth daily. [provider] Taking Active            Med Note Jodi Mourning, Toney Reil Apr 13, 2022  9:26 AM) Dewaine Conger ~ 3 times weekly, on days she takes torsemide 40 mg   pregabalin (LYRICA) 25 MG capsule [id number] No Take 25 mg by mouth 2 (two) times daily. [provider] Taking Active Self  Semaglutide, 1 MG/DOSE, (OZEMPIC, 1 MG/DOSE,) 4 MG/3ML SOPN [id number] No Inject 1 mg into the skin once a week. Buford Dresser, MD Taking Active   sertraline (ZOLOFT) 50 MG tablet [id number] No Take 50 mg by mouth in the morning. [provider] Taking Active Self  spironolactone (ALDACTONE) 25 MG tablet [id number] No Take 25 mg by mouth daily. [provider] Taking Active   torsemide (DEMADEX) 20 MG tablet [id number]  Take 1 tablet (20 mg total)  by mouth daily as needed. Osborn, Silvana, [place]  Active   traZODone (DESYREL) 50 MG tablet [id number] No Take 50 mg by mouth at bedtime. [provider] Taking Active Self  TURMERIC CURCUMIN PO [id number] No Take by mouth. [provider] Taking Active            Patient Active Problem List   Diagnosis Date Noted   Chronic constipation 03/31/2022   Pain due to onychomycosis of toenails of both feet 12/05/2021   Pure hypercholesterolemia 05/27/2021   Chronic diastolic CHF (congestive heart failure) ([place]) 04/29/2021   Hypertensive urgency 04/14/2021   Acute on chronic diastolic CHF (congestive heart failure) ([place]) 04/14/2021   Chest pain 03/05/2021   CAD (coronary artery disease) 03/05/2021   T2DM (type 2 diabetes mellitus) ([place]) 03/05/2021   CKD (chronic kidney disease) stage 4, GFR 15-29 ml/min ([place]) 03/05/2021   Class 3 obesity ([place]) 03/05/2021   CVA (cerebral vascular accident) ([place]) 03/05/2021   Acute on chronic diastolic heart failure ([place]) 03/05/2021   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   AKI (acute kidney injury) ([place]) 03/05/2021   Vitreous hemorrhage of left eye ([place]) 11/14/2020   Orthopnea 08/06/2020   Vision loss of right eye 08/06/2020   Excessive daytime sleepiness 01/11/2020   Iron deficiency anemia 12/14/2019   Vitreous hemorrhage, right ([place]) 12/04/2019   Diabetic neuropathy ([place]) 07/26/2019   Proliferative diabetic retinopathy of both 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 ([place]) 02/03/2019   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   Conductive hearing loss, bilateral 03/03/2018   Chronic bilateral low back pain without sciatica 08/21/2015   Chronic kidney disease (CKD) stage G3b/A3, moderately decreased glomerular filtration rate (GFR) between 30-44 mL/min/1.73 square meter and albuminuria creatinine ratio greater than 300 mg/g ([place]) 04/30/2015   Type 2 diabetes mellitus with diabetic polyneuropathy, with long-term current  use of insulin ([place]) 02/15/2015   Advance directive on file 05/17/2014   Refusal of blood transfusions as patient is Jehovah's Witness 05/17/2014   OSA treated with BiPAP 02/23/2014   Coronary artery disease 07/07/2013   Severe obesity (BMI >= 40) ([place]) 05/18/2013   Neuropathy [id number]   Diastolic heart failure ([place]) 10/16/2012   Essential hypertension 10/16/2012   Conditions to be addressed/monitored per PCP order:  Chronic healthcare management needs, neuropathy, HTN, retinopathy, CKD, DM, CAD, OSA, chronic pain, depression, CHF  Care Plan : [place]  Updates made by Maria Medicus, RN since 08/04/2022 12:00 AM     Problem: Knowledge Deficit and Care Coordination Needs Related to Management  of HTN, HF, Type 2 DM   Priority: High     Long-Range Goal: Development of Plan of Care to Address Knowledge Deficits and Care Coordination Needs related to  management of CHF, CAD, HTN, HLD, DMII, CKD Stage 4, and Depression   Start Date: 05/06/2021  Expected End Date: 10/07/2022  Priority: High  Note:   Current Barriers:  Knowledge Deficits related to plan of care for management of CHF, CAD, HTN, HLD, DMII, CKD Stage 4, and Depression 08/04/22:  Patient states she has blled right ey-has appt with Ophthalmologist tomorrow.  States she has gained 7 pounds this month-discussed nutritionist referral, [facility], and [facility]-patient states she will call.  LCSW following for depression-patient trying to get an appt with [facility] and states she will call today.  Patient has PCP and Nephrology appts in March-to schedule ENDO.  Blood sugars 117-168.  BP stable.  RNCM Clinical Goal(s):  Patient will demonstrate ongoing adherence to prescribed treatment plan for CHF, CAD, HTN, HLD, DMII, CKD Stage 4, and Depression as evidenced by patient report of improved health and quality of life and no unplanned ED or unplanned hospital admissions continue to work with RN  Care Manager and/or Social Worker to address care management and care coordination needs related to CHF, CAD, HTN, HLD, DMII, CKD Stage 4, and Depression as evidenced by adherence to CM Team Scheduled appointments      Work with [facility] LCSW and psychiatrist to treat depression Patient to schedule  an appt with ENDO  Interventions: Inter-disciplinary care team collaboration (see longitudinal plan of care) Evaluation of current treatment plan related to  self management and patient's adherence to plan as established by provider Mailed summary of benefits for her [place] Medicaid plan to her home address Collaborated with Care Guide for dental resources-completed. Care Guide referral for dental resources Collaborated with BSW for assistance with [facility] appt.-completed. BSW referral for appt assistance. Collaborated with Pharmacy regarding  uncontrolled HTN Pharmacy referral for uncontrolled HTN-completed. Collaborated with LCSW for depression LCSW referral for depression-completed   Heart Failure Interventions:  (Status: Goal on Track (progressing): YES.)  Long Term Goal - patient states her fluid retention has significantly improved,  says she cannot tolerate the compression stockings because they are too tight and make her legs hurt Assessed patient's heart failure management strategies  Diabetes:  (Status: Goal on Track (progressing): YES.) Long Term Goal   Lab Results  Component Value Date   HGBA1C 6.7 (H) 04/29/2021   Hgn A1C=6.3 on 12/26/21 Hgb A1C=5.2 in October per patient Assessed patient's understanding of A1C goal: <7% Reviewed medications with patient and discussed importance of medication adherence;        Counseled on importance of regular laboratory monitoring as prescribed;        Discussed plans with patient for ongoing care management follow up and provided patient with direct contact information for care management team;      Advised patient, providing  education and rationale, to check cbg three times daily and record        call provider for findings outside established parameters;       Review of patient status, including review of consultants reports, relevant laboratory and other test results, and medications completed;       Discussed addition of Ozempic to medication regimen , reviewed mechanism of action and proven cardiovascular benefits To schedule ENDO appt-discussed nutrition referral, [facility], [facility]  Hypertension and CKD stage 4 : (  Status: Goal on Track (progressing): YES.) Long Term Goal-  has been placed on kidney transplant waiting list, fistu;a placed 12/09/21,ready for use 03/11/22. Last practice recorded BP readings:  BP 04/01/22-145/87 BP 05/01/22-141/70 BP 08/04/22-155/74 BP Readings from Last 3 Encounters:  09/30/21 120/70  09/12/21 (!) 107/58  09/08/21 (!) 144/71     Most recent eGFR/CrCl:    No components found for: CRCL Lab Results  Component Value Date   NA 136 08/19/2021   K 3.9 08/19/2021   CREATININE 2.51 (H) 08/19/2021   EGFR 19 (L) 03/13/2021   GFRNONAA 22 (L) 08/19/2021   GLUCOSE 122 (H) 08/19/2021    Evaluation of current treatment plan related to hypertension self management and patient's adherence to plan as established by provider;   Reviewed medications with patient and discussed importance of compliance;  Discussed plans with patient for ongoing care management follow up and provided patient with direct contact information for care management team; Advised patient, providing education and rationale, to monitor blood pressure daily and record, calling PCP for findings outside established parameters;  Reviewed scheduled/upcoming provider appointments  Patient with labile blood pressures, medication adjusted, patient to  f/u with nephrologist-appt in March, also PCP  Weight Loss Interventions:  (Status:  New goal.) Long Term Goal--patient has started Ozempic Discussed  indications, mechanism of action, common side effects and CV benefits  of GLP1 Ozempic Assessed tolerance of Ozempic 08/04/22-patient states she has gained 7 pounds this month because she is not eating well-discussed nutrition referral, exercise, and [facility]-patient states she will check on these.  Interdisciplinary Collaboration:  (Status: Goal on Track (progressing): YES.) Long Term Goal  Collaborated with BSW to initiate plan of care to address needs related to Housing barriers, Inability to perform ADL's independently, and Inability to perform IADL's independently in patient with CHF, CAD, HTN, HLD, DM, CKD Stage 4, and Depression Assessed status of patient's involvement with behavioral health counselor Collaboration with pharmacist for medication review, discussion of ? Medication side effects causing chronic constipation and arranging for patient to received home delivery of medications in blister packs  Patient Goals/Self-Care Activities: Take medications as prescribed   Attend all scheduled provider appointments Call pharmacy for medication refills 3-7 days in advance of running out of medications Attend church or other social activities Perform all self care activities independently  Call provider office for new concerns or questions  Work with the social worker to address care coordination needs and will continue to work with the clinical team to address health care and disease management related needs call office if I gain more than 2 pounds in one day or 5 pounds in one week keep legs up while sitting track weight in diary use salt in moderation watch for swelling in feet, ankles and legs every day weigh myself daily know when to call the doctorfor fluid weight gain or consistently abnormal BP readings at home track symptoms and what helps feel better or worse check blood sugar at prescribed times: three times daily enter blood sugar readings and medication or  insulin into daily log take the blood sugar log to all doctor visits take the blood sugar meter to all doctor visits eat fish at least once per week fill half of plate with vegetables manage portion size check blood pressure daily write blood pressure results in a log or diary learn about high blood pressure take blood pressure log to all doctor appointments call doctor for signs and symptoms of high blood pressure keep  all doctor appointments take medications for blood pressure exactly as prescribed Limit fluid intake to 1 liter per day Collaborate with managed Medicaid team of BSW and pharmacist to address identified needs Work with cardiology pharmacist to assist with BP stabilization Work with [facility] LCSW and psychiatrist to treat depression Review summary of [employer] benefits mailed to home address and call to enroll in program that will benefit your chronic health issues Continue to use [place] Management calendar to record weight , BP and CBGs so readings are all in one place Schedule dental appt Schedule ENDO appt   Long-Range Goal: Establish Plan of Care for Chronic Disease Management Needs   Priority: High  Note:   Timeframe:  Long-Range Goal Priority:  High Start Date:     11/12/21                        Expected End Date:  ongoing                     Follow Up Date: 09/03/22   - schedule appointment for vaccines needed due to my age or health - schedule recommended health tests (blood work, mammogram, colonoscopy, pap test) - schedule and keep appointment for annual check-up    Why is this important?   Screening tests can find diseases early when they are easier to treat.  Your doctor or nurse will talk with you about which tests are important for you.  Getting shots for common diseases like the flu and shingles will help prevent them.  08/04/22:  Ophthalmology follow up tomorrow, PCP and Nephrology in March   Follow Up:  Patient agrees to Care  Plan and Follow-up.  Plan: The Managed Medicaid care management team will reach out to the patient again over the next 30 business  days. and The  Patient has been provided with contact information for the Managed Medicaid care management team and has been advised to call with any health related questions or concerns.  Date/time of next scheduled RN care management/care coordination outreach: 09/03/22 at 0900

## 2022-08-11 ENCOUNTER — Other Ambulatory Visit (HOSPITAL_COMMUNITY): Payer: Self-pay

## 2022-08-11 ENCOUNTER — Other Ambulatory Visit: Payer: Self-pay | Admitting: Cardiology

## 2022-08-11 DIAGNOSIS — Z794 Long term (current) use of insulin: Secondary | ICD-10-CM

## 2022-08-11 DIAGNOSIS — E113513 Type 2 diabetes mellitus with proliferative diabetic retinopathy with macular edema, bilateral: Secondary | ICD-10-CM

## 2022-08-11 MED ORDER — OZEMPIC (1 MG/DOSE) 4 MG/3ML ~~LOC~~ SOPN
1.0000 mg | PEN_INJECTOR | SUBCUTANEOUS | 2 refills | Status: DC
  Filled 2022-08-11: qty 3, 28d supply, fill #0
  Filled 2022-09-08: qty 3, 28d supply, fill #1

## 2022-08-11 NOTE — Telephone Encounter (Signed)
Please review for refill. Thank you! 

## 2022-08-12 ENCOUNTER — Other Ambulatory Visit (HOSPITAL_COMMUNITY): Payer: Self-pay | Admitting: Family Medicine

## 2022-08-12 ENCOUNTER — Other Ambulatory Visit: Payer: Medicaid Other | Admitting: Licensed Clinical Social Worker

## 2022-08-12 NOTE — Patient Instructions (Signed)
Visit Information  Ms. Riedl was given information about Medicaid Managed Care team care coordination services as a part of their [facility] Medicaid benefit. Maudell Korby verbally consented to engagement with the [facility] Managed Care team.   If you are experiencing a medical emergency, please call 911 or report to your local emergency department or urgent care.   If you have a non-emergency medical problem during routine business hours, please contact your provider's office and ask to speak with a nurse.   For questions related to your [facility] health plan, please call: [phone] or go here:[email or web address]  If you would like to schedule transportation through your [facility] plan, please call the following number at least 2 days in advance of your appointment: [phone].  You can also use the MTM portal or MTM mobile app to manage your rides. For the portal, please go to [email or web address].  Call the [employer] at [phone], at any time, 24 hours a day, 7 days a week. If you are in danger or need immediate medical attention call 911.  If you would like help to quit smoking, call 1-800-QUIT-NOW (574)557-9404) OR Espaol: 1-855-Djelo-Ya HD:1601594) o para ms informacin haga clic aqu or Text READY to 200-400 to register via text  Following is a copy of your plan of care:  Care Plan : General Social Work (Adult)  Updates made by Greg Cutter, LCSW since 08/12/2022 12:00 AM     Problem: Anxiety Identification (Anxiety)      Long-Range Goal: Anxiety Symptoms Identified   Start Date: 05/16/2021  Priority: High  Note:    Timeframe:  Long-Range Goal Priority:  High Start Date:    07/09/22                 Expected End Date:   ongoing                     Follow Up Date- 08/20/22 at 9am  Current barriers:   Chronic Mental Health needs related to depression Mental Health Concerns  and Social Isolation Needs Support,  Education, and Care Coordination in order to meet unmet mental health needs. Clinical Goal(s): demonstrate a reduction in symptoms related to :Depression: loss of energy/fatigue feelings of worthlessness, guilt  verbalize understanding of plan for management of Depression   Patient Goals/Self-Care Activities: Over the next 120 days REFERRAL PLACED I have placed a referral for counseling at [facility] and they will contact you.  If you have not heard from them in for counseling at [facility] and  please follow up.    The following coping skill education was provided for stress relief and mental health management: "When your car dies or a deadline looms, how do you respond? Long-term, low-grade or acute stress takes a serious toll on your body and mind, so don't ignore feelings of constant tension. Stress is a natural part of life. However, too much stress can harm our health, especially if it continues every day. This is chronic stress and can put you at risk for heart problems like heart disease and depression. Understand what's happening inside your body and learn simple coping skills to combat the negative impacts of everyday stressors.  Types of Stress There are two types of stress: Emotional - types of emotional stress are relationship problems, pressure at work, financial worries, experiencing discrimination or having a major life change. Physical - Examples of physical stress include being sick having pain, not sleeping well, recovery from an  injury or having an alcohol and drug use disorder. Fight or Flight Sudden or ongoing stress activates your nervous system and floods your bloodstream with adrenaline and cortisol, two hormones that raise blood pressure, increase heart rate and spike blood sugar. These changes pitch your body into a fight or flight response. That enabled our ancestors to outrun saber-toothed tigers, and it's helpful today for situations like dodging a car  accident. But most modern chronic stressors, such as finances or a challenging relationship, keep your body in that heightened state, which hurts your health. Effects of Too Much Stress If constantly under stress, most of [place] will eventually start to function less well.  Multiple studies link chronic stress to a higher risk of heart disease, stroke, depression, weight gain, memory loss and even premature death, so it's important to recognize the warning signals. Talk to your doctor about ways to manage stress if you're experiencing any of these symptoms: Prolonged periods of poor sleep. Regular, severe headaches. Unexplained weight loss or gain. Feelings of isolation, withdrawal or worthlessness. Constant anger and irritability. Loss of interest in activities. Constant worrying or obsessive thinking. Excessive alcohol or drug use. Inability to concentrate.  10 Ways to Cope with Chronic Stress It's key to recognize stressful situations as they occur because it allows you to focus on managing how you react. We all need to know when to close our eyes and take a deep breath when we feel tension rising. Use these tips to prevent or reduce chronic stress. 1. Rebalance Work and Home All work and no play? If you're spending too much time at the office, intentionally put more dates in your calendar to enjoy time for fun, either alone or with others. 2. Get Regular Exercise Moving your body on a regular basis balances the nervous system and increases blood circulation, helping to flush out stress hormones. Even a daily 20-minute walk makes a difference. Any kind of exercise can lower stress and improve your mood ? just pick activities that you enjoy and make it a regular habit. 3. Eat Well and Limit Alcohol and Stimulants Alcohol, nicotine and caffeine may temporarily relieve stress but have negative health impacts and can make stress worse in the long run. Well-nourished bodies cope better, so start with  a good breakfast, add more organic fruits and vegetables for a well-balanced diet, avoid processed foods and sugar, try herbal tea and drink more water. 4. Connect with Supportive People Talking face to face with another person releases hormones that reduce stress. Lean on those good listeners in your life. 5. [place] Time Do you enjoy gardening, reading, listening to music or some other creative pursuit? Engage in activities that bring you pleasure and joy; research shows that reduces stress by almost half and lowers your heart rate, too. 6. Practice Meditation, Stress Reduction or Yoga Relaxation techniques activate a state of restfulness that counterbalances your body's fight-or-flight hormones. Even if this also means a 10-minute break in a long day: listen to music, read, go for a walk in nature, do a hobby, take a bath or spend time with a friend. Also consider doing a mindfulness exercise or try a daily deep breathing or imagery practice. Deep Breathing Slow, calm and deep breathing can help you relax. Try these steps to focus on your breathing and repeat as needed. Find a comfortable position and close your eyes. Exhale and drop your shoulders. Breathe in through your nose; fill your lungs and then your belly. Think of  relaxing your body, quieting your mind and becoming calm and peaceful. Breathe out slowly through your nose, relaxing your belly. Think of releasing tension, pain, worries or distress. Repeat steps three and four until you feel relaxed. Imagery This involves using your mind to excite the senses -- sound, vision, smell, taste and feeling. This may help ease your stress. Begin by getting comfortable and then do some slow breathing. Imagine a place you love being at. It could be somewhere from your childhood, somewhere you vacationed or just a place in your imagination. Feel how it is to be in the place you're imagining. Pay attention to the sounds, air, colors, and who  is there with you. This is a place where you feel cared for and loved. All is well. You are safe. Take in all the smells, sounds, tastes and feelings. As you do, feel your body being nourished and healed. Feel the calm that surrounds you. Breathe in all the good. Breathe out any discomfort or tension. 7. Sleep Enough If you get less than seven to eight hours of sleep, your body won't tolerate stress as well as it could. If stress keeps you up at night, address the cause, and add extra meditation into your day to make up for the lost z's. Try to get seven to nine hours of sleep each night. Make a regular bedtime schedule. Keep your room dark and cool. Try to avoid computers, TV, cell phones and tablets before bed. 8. [place] with Connections You Enjoy Go out for a coffee with a friend, chat with a neighbor, call a family member, visit with a clergy member, or even hang out with your pet. Clinical studies show that spending even a short time with a companion animal can cut anxiety levels almost in half. 9. Take a Vacation Getting away from it all can reset your stress tolerance by increasing your mental and emotional outlook, which makes you a happier, more productive person upon return. Leave your cellphone and laptop at home! 10. See a Counselor, Coach or Therapist If negative thoughts overwhelm your ability to make positive changes, it's time to seek professional help. Make an appointment today--your health and life are worth it."

## 2022-08-12 NOTE — Patient Outreach (Signed)
Medicaid Managed Care Social Work Note  08/12/2022 Name:  Maria Hudson MRN:  [id number] DOB:  Nov 18, 1960  Maria Hudson is an 62 y.o. year old female who is a primary patient of Nolene Ebbs, MD.  The Medicaid Managed Care Coordination team was consulted for assistance with:  [place] and Resources  Ms. Michel was given information about Medicaid Managed Care Coordination team services today. [name] Patient agreed to services and verbal consent obtained.  Engaged with patient  for by telephone forfollow up visit in response to referral for case management and/or care coordination services.   Assessments/Interventions:  Review of past medical history, allergies, medications, health status, including review of consultants reports, laboratory and other test data, was performed as part of comprehensive evaluation and provision of chronic care management services.  SDOH: (Social Determinant of Health) assessments and interventions performed: SDOH Interventions    Flowsheet Row Patient Outreach Telephone from 08/12/2022 in [place] Patient Outreach Telephone from 08/04/2022 in [place] Patient Outreach Telephone from 07/29/2022 in [place] Patient Outreach Telephone from 07/17/2022 in [place] Patient Outreach Telephone from 07/09/2022 in [place] Patient Outreach Telephone from 07/07/2022 in [place] Interventions -- -- -- -- -- Intervention Not Indicated  Transportation Interventions -- Intervention Not Indicated -- -- -- --  Depression Interventions/Treatment  -- -- -- -- Counseling, Medication Medication  [LCSW referral]  Financial Strain Interventions -- Intervention Not Indicated -- -- -- --  Stress Interventions Offered [employer],  Provide Counseling -- [place], Provide Counseling  [Patient reports being sick] [employer], Provide Counseling --       Advanced Directives Status:  See Care Plan for related entries.  Care Plan                 Allergies  Allergen Reactions   Metformin And Related Nausea Only   Other     Clonidine patch causes skin irritation    Hydrocodone Itching    Patient able to tolerate when taken with Benadryl.   Oxycodone-Acetaminophen Itching    Patient able to tolerate when taken with Benadryl.    Medications Reviewed Today     Reviewed by Gayla Medicus, RN (Registered Nurse) on 08/04/22 at 1112  Med List Status: <None>   Medication Order Taking? Sig Documenting Provider Last Dose Status Informant  Accu-Chek Softclix Lancets lancets [id number] No 3 (three) times daily. [provider] Taking Active Self  acetaminophen (TYLENOL) 500 MG tablet [id number] No Take 1,000 mg by mouth every 6 (six) hours as needed for moderate pain or headache. [provider] Taking Active Self  ACIDOPHILUS LACTOBACILLUS PO [id number] No Take 1 Capful by mouth daily. [provider] Taking Active Self  allopurinol (ZYLOPRIM) 100 MG tablet [id number] No Take 100 mg by mouth in the morning. [provider] Taking Active Self  amLODipine (NORVASC) 10 MG tablet [id number] No Take 10 mg by mouth daily. [provider] Taking Active   aspirin EC 81 MG tablet [id number] No Take 81 mg by mouth in the morning. Swallow whole. [provider] Taking Active Self           Med Note [name]   Mon Apr 14, 2021  9:22 PM)    atorvastatin (LIPITOR)  40 MG tablet [id number] No Take 1 tablet (40 mg total) by mouth every evening. Skeet Latch, MD Taking Active Self  buPROPion [facility]) 100 MG tablet [id number] No Take 100 mg by mouth 2 (two) times daily.  [provider] Taking Active Self  carvedilol (COREG) 25 MG tablet [id number] No Take 1 tablet (25 mg total) by mouth 2 (two) times daily with a meal. [name], [name], MD Taking Expired 05/05/22 2359 Self  cholecalciferol (VITAMIN D3) 25 MCG (1000 UNIT) tablet [id number] No Take 2,000 Units by mouth daily. [provider] Taking Active Self  cloNIDine (CATAPRES) 0.3 MG tablet [id number] No Take 0.3 mg by mouth 3 (three) times daily. [provider] Taking Active Self           Med Note Stanford Scotland   Tue Mar 03, 2022  9:23 AM)    dapagliflozin propanediol (FARXIGA) 10 MG TABS tablet [id number]  Take 1 tablet (10 mg total) by mouth daily before breakfast. Rafael Bihari, FNP  Active   diclofenac Sodium (VOLTAREN) 1 % GEL [id number] No Apply 2 g topically 4 (four) times daily as needed (chest pain.). [provider] Taking Active Self  ferrous sulfate 325 (65 FE) MG tablet [id number] No Take 325 mg by mouth 2 (two) times daily. [provider] Taking Active Self  fluticasone (FLONASE) 50 MCG/ACT nasal spray [id number] No Place 1 spray into both nostrils daily as needed for allergies or rhinitis. [provider] Taking Active Self  hydrALAZINE (APRESOLINE) 100 MG tablet [id number]  TAKE 1 TABLET BY MOUTH 3 TIMES DAILY. Skeet Latch, MD  Active   insulin glargine (LANTUS) 100 unit/mL SOPN [id number] No Inject 5 Units into the skin at bedtime. [provider] Taking Active Self           Med Note Stanford Scotland   Tue Aug 19, 2021  9:37 AM)    isosorbide mononitrate (IMDUR) 60 MG 24 hr tablet [id number] No Take 1.5 tablets (90 mg total) by mouth daily. Benton, Maricela Bo, FNP Taking Active Self  linaclotide Rolan Lipa) 145 MCG CAPS capsule [id number] No Take 1 capsule (145 mcg total) by mouth daily before breakfast. Zehr, Laban Emperor, PA-C Taking Active   loratadine (CLARITIN) 10 MG tablet [id number] No Take 10 mg by mouth in the  morning. [provider] Taking Active Self  Multiple Vitamin (MULTIVITAMIN ADULT) TABS [id number] No Take 1 tablet by mouth in the morning. [provider] Taking Active Self  nitroGLYCERIN (NITROSTAT) 0.4 MG SL tablet [id number] No Place 1 tablet (0.4 mg total) under the tongue every 5 (five) minutes as needed for chest pain. [name], [name], MD Taking Active Self           Med Note Gilman Buttner, Juanita Laster   Tue Aug 19, 2021  9:35 AM)    Cira Servant FLEXPEN 100 UNIT/ML FlexPen [id number] No Inject 3 Units into the skin 3 (three) times daily with meals. [provider] Taking Active Self           Med Note (ADSIDE, Tiney Rouge   Fri Apr 17, 2022  8:30 AM)    Omega-3 Fatty Acids (FISH OIL) 1200 MG CAPS [id number] No Take 1,200 mg by mouth in the morning. [provider] Taking Active Self  Polyethylene Glycol 3350 (MIRALAX PO) [id number] No Take by mouth daily as needed. [provider] Taking Active   potassium chloride SA (KLOR-CON M) 20 MEQ tablet [id number] No Take 20 mEq by mouth  daily. [provider] Taking Active            Med Note Jodi Mourning, Toney Reil Apr 13, 2022  9:26 AM) Dewaine Conger ~ 3 times weekly, on days she takes torsemide 40 mg   pregabalin (LYRICA) 25 MG capsule [id number] No Take 25 mg by mouth 2 (two) times daily. [provider] Taking Active Self  Semaglutide, 1 MG/DOSE, (OZEMPIC, 1 MG/DOSE,) 4 MG/3ML SOPN [id number] No Inject 1 mg into the skin once a week. Buford Dresser, MD Taking Active   sertraline (ZOLOFT) 50 MG tablet [id number] No Take 50 mg by mouth in the morning. [provider] Taking Active Self  spironolactone (ALDACTONE) 25 MG tablet [id number] No Take 25 mg by mouth daily. [provider] Taking Active   torsemide (DEMADEX) 20 MG tablet [id number]  Take 1 tablet (20 mg total) by mouth daily as needed. [employer], [place], [place]  Active   traZODone (DESYREL) 50 MG tablet [id number] No Take  50 mg by mouth at bedtime. [provider] Taking Active Self  TURMERIC CURCUMIN PO [id number] No Take by mouth. [provider] Taking Active             Patient Active Problem List   Diagnosis Date Noted   Chronic constipation 03/31/2022   Pain due to onychomycosis of toenails of both feet 12/05/2021   Pure hypercholesterolemia 05/27/2021   Chronic diastolic CHF (congestive heart failure) ([place]) 04/29/2021   Hypertensive urgency 04/14/2021   Acute on chronic diastolic CHF (congestive heart failure) ([place]) 04/14/2021   Chest pain 03/05/2021   CAD (coronary artery disease) 03/05/2021   T2DM (type 2 diabetes mellitus) ([place]) 03/05/2021   CKD (chronic kidney disease) stage 4, GFR 15-29 ml/min ([place]) 03/05/2021   Class 3 obesity ([place]) 03/05/2021   CVA (cerebral vascular accident) ([place]) 03/05/2021   Acute on chronic diastolic heart failure ([place]) 03/05/2021   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   AKI (acute kidney injury) ([place]) 03/05/2021   Vitreous hemorrhage of left eye ([place]) 11/14/2020   Orthopnea 08/06/2020   Vision loss of right eye 08/06/2020   Excessive daytime sleepiness 01/11/2020   Iron deficiency anemia 12/14/2019   Vitreous hemorrhage, right ([place]) 12/04/2019   Diabetic neuropathy ([place]) 07/26/2019   Proliferative diabetic retinopathy of both 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 ([place]) 02/03/2019   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   Conductive hearing loss, bilateral 03/03/2018   Chronic bilateral low back pain without sciatica 08/21/2015   Chronic kidney disease (CKD) stage G3b/A3, moderately decreased glomerular filtration rate (GFR) between 30-44 mL/min/1.73 square meter and albuminuria creatinine ratio greater than 300 mg/g ([place]) 04/30/2015   Type 2 diabetes mellitus with diabetic polyneuropathy, with long-term current use of insulin ([place]) 02/15/2015   Advance directive on file 05/17/2014   Refusal of blood transfusions as  patient is Jehovah's Witness 05/17/2014   OSA treated with BiPAP 02/23/2014   Coronary artery disease 07/07/2013   Severe obesity (BMI >= 40) ([place]) 05/18/2013   Neuropathy [id number]   Diastolic heart failure ([place]) 10/16/2012   Essential hypertension 10/16/2012    Conditions to be addressed/monitored per PCP order:  Anxiety  Care Plan : General Social Work (Adult)  Updates made by Greg Cutter, LCSW since 08/12/2022 12:00 AM     Problem: Anxiety Identification (Anxiety)      Long-Range Goal: Anxiety Symptoms Identified   Start Date: 05/16/2021  Priority: High  Note:    Timeframe:  Long-Range Goal  Priority:  High Start Date:    07/09/22                 Expected End Date:   ongoing                     Follow Up Date- 08/20/22 at 9am  Current barriers:   Chronic Mental Health needs related to depression Mental Health Concerns  and Social Isolation Needs Support, Education, and Care Coordination in order to meet unmet mental health needs. Clinical Goal(s): demonstrate a reduction in symptoms related to :Depression: loss of energy/fatigue feelings of worthlessness, guilt  verbalize understanding of plan for management of Depression   Clinical Interventions:  Assessed patient's previous and current treatment, coping skills, support system and barriers to care  Depression screen reviewed  Active listening / Reflection utilized  Behavioral Activation reviewed Provided brief CBT  Suicidal Ideation/Homicidal Ideation assessed: Discussed Guardianship and reviewed process  [place] of Attorney  Discussed referral for psychiatry , patient does not wish to pursue a referral for psychiatry at this time Made referral to [facility] for counseling on 07/09/22 and email was sent to patient today as well with mental health resources Healthy self-care education was provided during 07/17/22 session due to patient being sick this past week [facility] LCSW 07/17/22 update- Patient  reports that she has already received a call from [facility] regarding the therapy referral [facility] LCSW made. She said she missed this call due to being sick and will make sure to contact them back for scheduling soon. Patient was encouraged to drink a lot of fluids to help hydrate her body and she was encouraged to get some rest. 07/29/22 Update- Patient reports that she is playing phone tag with [facility] but they are both trying to reach each other and have not been able to do so but she will return their last call and will notify [facility] LCSW if she continues to have issues. Patient reports that she is working actively on fighting her negative feelings and emotions in order to live her life more positively. LCSW 08/12/22 update- Patient reports that she has been unable to establish contact with [facility] so [facility] LCSW and patient completed joint call to agency and left a message. [facility] returned call to [facility] LCSW and stated that their therapist and has been trying to reach patient to get her scheduled but has been unable to reach her. Patient was called and provided this update and is agreeable to contacting therapist directly given the contact information left in her voice messages. Self-care education provided and an addition email was sent to her as well with resources/suggested self-care tips and tricks.   Review resources, discussed options and provided patient information about  Options for mental health treatment based on need and insurance  Inter-disciplinary care team collaboration (see longitudinal plan of care) Patient Goals/Self-Care Activities: Over the next 120 days REFERRAL PLACED I have placed a referral for counseling at [facility] and they will contact you.  If you have not heard from them in for counseling at [facility] and  please follow up.    The following coping skill education was provided for stress relief and mental health management: "When your  car dies or a deadline looms, how do you respond? Long-term, low-grade or acute stress takes a serious toll on your body and mind, so don't ignore feelings of constant tension. Stress is a natural part of life. However,  too much stress can harm our health, especially if it continues every day. This is chronic stress and can put you at risk for heart problems like heart disease and depression. Understand what's happening inside your body and learn simple coping skills to combat the negative impacts of everyday stressors.  Types of Stress There are two types of stress: Emotional - types of emotional stress are relationship problems, pressure at work, financial worries, experiencing discrimination or having a major life change. Physical - Examples of physical stress include being sick having pain, not sleeping well, recovery from an injury or having an alcohol and drug use disorder. Fight or Flight Sudden or ongoing stress activates your nervous system and floods your bloodstream with adrenaline and cortisol, two hormones that raise blood pressure, increase heart rate and spike blood sugar. These changes pitch your body into a fight or flight response. That enabled our ancestors to outrun saber-toothed tigers, and it's helpful today for situations like dodging a car accident. But most modern chronic stressors, such as finances or a challenging relationship, keep your body in that heightened state, which hurts your health. Effects of Too Much Stress If constantly under stress, most of [place] will eventually start to function less well.  Multiple studies link chronic stress to a higher risk of heart disease, stroke, depression, weight gain, memory loss and even premature death, so it's important to recognize the warning signals. Talk to your doctor about ways to manage stress if you're experiencing any of these symptoms: Prolonged periods of poor sleep. Regular, severe headaches. Unexplained weight loss or  gain. Feelings of isolation, withdrawal or worthlessness. Constant anger and irritability. Loss of interest in activities. Constant worrying or obsessive thinking. Excessive alcohol or drug use. Inability to concentrate.  10 Ways to Cope with Chronic Stress It's key to recognize stressful situations as they occur because it allows you to focus on managing how you react. We all need to know when to close our eyes and take a deep breath when we feel tension rising. Use these tips to prevent or reduce chronic stress. 1. Rebalance Work and Home All work and no play? If you're spending too much time at the office, intentionally put more dates in your calendar to enjoy time for fun, either alone or with others. 2. Get Regular Exercise Moving your body on a regular basis balances the nervous system and increases blood circulation, helping to flush out stress hormones. Even a daily 20-minute walk makes a difference. Any kind of exercise can lower stress and improve your mood ? just pick activities that you enjoy and make it a regular habit. 3. Eat Well and Limit Alcohol and Stimulants Alcohol, nicotine and caffeine may temporarily relieve stress but have negative health impacts and can make stress worse in the long run. Well-nourished bodies cope better, so start with a good breakfast, add more organic fruits and vegetables for a well-balanced diet, avoid processed foods and sugar, try herbal tea and drink more water. 4. Connect with Supportive People Talking face to face with another person releases hormones that reduce stress. Lean on those good listeners in your life. 5. The [place] Time Do you enjoy gardening, reading, listening to music or some other creative pursuit? Engage in activities that bring you pleasure and joy; research shows that reduces stress by almost half and lowers your heart rate, too. 6. Practice Meditation, Stress Reduction or Yoga Relaxation techniques activate a state of  restfulness that counterbalances your body's fight-or-flight hormones.  Even if this also means a 10-minute break in a long day: listen to music, read, go for a walk in nature, do a hobby, take a bath or spend time with a friend. Also consider doing a mindfulness exercise or try a daily deep breathing or imagery practice. Deep Breathing Slow, calm and deep breathing can help you relax. Try these steps to focus on your breathing and repeat as needed. Find a comfortable position and close your eyes. Exhale and drop your shoulders. Breathe in through your nose; fill your lungs and then your belly. Think of relaxing your body, quieting your mind and becoming calm and peaceful. Breathe out slowly through your nose, relaxing your belly. Think of releasing tension, pain, worries or distress. Repeat steps three and four until you feel relaxed. Imagery This involves using your mind to excite the senses -- sound, vision, smell, taste and feeling. This may help ease your stress. Begin by getting comfortable and then do some slow breathing. Imagine a place you love being at. It could be somewhere from your childhood, somewhere you vacationed or just a place in your imagination. Feel how it is to be in the place you're imagining. Pay attention to the sounds, air, colors, and who is there with you. This is a place where you feel cared for and loved. All is well. You are safe. Take in all the smells, sounds, tastes and feelings. As you do, feel your body being nourished and healed. Feel the calm that surrounds you. Breathe in all the good. Breathe out any discomfort or tension. 7. Sleep Enough If you get less than seven to eight hours of sleep, your body won't tolerate stress as well as it could. If stress keeps you up at night, address the cause, and add extra meditation into your day to make up for the lost z's. Try to get seven to nine hours of sleep each night. Make a regular bedtime schedule. Keep your room  dark and cool. Try to avoid computers, TV, cell phones and tablets before bed. 8. [place] with Connections You Enjoy Go out for a coffee with a friend, chat with a neighbor, call a family member, visit with a clergy member, or even hang out with your pet. Clinical studies show that spending even a short time with a companion animal can cut anxiety levels almost in half. 9. Take a Vacation Getting away from it all can reset your stress tolerance by increasing your mental and emotional outlook, which makes you a happier, more productive person upon return. Leave your cellphone and laptop at home! 10. See a Counselor, Coach or Therapist If negative thoughts overwhelm your ability to make positive changes, it's time to seek professional help. Make an appointment today--your health and life are worth it."        Follow up:  Patient agrees to Care Plan and Follow-up.  Plan: The Managed Medicaid care management team will reach out to the patient again over the next 30 days.  Date/time of next scheduled Social Work care management/care coordination outreach:  08/20/22 at 9 am  Eula Fried, BSW, MSW, [place] Medicaid LCSW [email or web address] Phone: [phone]

## 2022-08-14 DIAGNOSIS — Z419 Encounter for procedure for purposes other than remedying health state, unspecified: Secondary | ICD-10-CM | POA: Diagnosis not present

## 2022-08-14 DIAGNOSIS — G4733 Obstructive sleep apnea (adult) (pediatric): Secondary | ICD-10-CM | POA: Diagnosis not present

## 2022-08-17 DIAGNOSIS — N184 Chronic kidney disease, stage 4 (severe): Secondary | ICD-10-CM | POA: Diagnosis not present

## 2022-08-18 DIAGNOSIS — I5032 Chronic diastolic (congestive) heart failure: Secondary | ICD-10-CM | POA: Diagnosis not present

## 2022-08-18 DIAGNOSIS — E1122 Type 2 diabetes mellitus with diabetic chronic kidney disease: Secondary | ICD-10-CM | POA: Diagnosis not present

## 2022-08-18 DIAGNOSIS — N2581 Secondary hyperparathyroidism of renal origin: Secondary | ICD-10-CM | POA: Diagnosis not present

## 2022-08-18 DIAGNOSIS — N184 Chronic kidney disease, stage 4 (severe): Secondary | ICD-10-CM | POA: Diagnosis not present

## 2022-08-18 DIAGNOSIS — I129 Hypertensive chronic kidney disease with stage 1 through stage 4 chronic kidney disease, or unspecified chronic kidney disease: Secondary | ICD-10-CM | POA: Diagnosis not present

## 2022-08-19 ENCOUNTER — Other Ambulatory Visit: Payer: Medicaid Other | Admitting: Licensed Clinical Social Worker

## 2022-08-19 NOTE — Patient Instructions (Signed)
Visit Information  Maria Hudson was given information about Medicaid Managed Care team care coordination services as a part of their [facility] Medicaid benefit. Maria Hudson verbally consented to engagement with the [facility] Managed Care team.   If you are experiencing a medical emergency, please call 911 or report to your local emergency department or urgent care.   If you have a non-emergency medical problem during routine business hours, please contact your provider's office and ask to speak with a nurse.   For questions related to your [facility] health plan, please call: [phone] or go here:[email or web address]  If you would like to schedule transportation through your [facility] plan, please call the following number at least 2 days in advance of your appointment: [phone].  You can also use the MTM portal or MTM mobile app to manage your rides. For the portal, please go to [email or web address].  Call the [place] at [phone], at any time, 24 hours a day, 7 days a week. If you are in danger or need immediate medical attention call 911.  If you would like help to quit smoking, call 1-800-QUIT-NOW (450) 013-1504) OR Espaol: 1-855-Djelo-Ya HD:1601594) o para ms informacin haga clic aqu or Text READY to 200-400 to register via text   Following is a copy of your plan of care:  Care Plan : General Social Work (Adult)  Updates made by Greg Cutter, LCSW since 08/19/2022 12:00 AM     Problem: Anxiety Identification (Anxiety)      Long-Range Goal: Anxiety Symptoms Identified   Start Date: 05/16/2021  Priority: High  Note:    Timeframe:  Long-Range Goal Priority:  High Start Date:    07/09/22                 Expected End Date:   ongoing                     Follow Up Date- 08/27/22 at 12:45   Current barriers:   Chronic Mental Health needs related to depression Mental Health Concerns  and Social Isolation Needs Support,  Education, and Care Coordination in order to meet unmet mental health needs. Clinical Goal(s): demonstrate a reduction in symptoms related to :Depression: loss of energy/fatigue feelings of worthlessness, guilt  verbalize understanding of plan for management of Depression    Types of Stress There are two types of stress: Emotional - types of emotional stress are relationship problems, pressure at work, financial worries, experiencing discrimination or having a major life change. Physical - Examples of physical stress include being sick having pain, not sleeping well, recovery from an injury or having an alcohol and drug use disorder. Fight or Flight Sudden or ongoing stress activates your nervous system and floods your bloodstream with adrenaline and cortisol, two hormones that raise blood pressure, increase heart rate and spike blood sugar. These changes pitch your body into a fight or flight response. That enabled our ancestors to outrun saber-toothed tigers, and it's helpful today for situations like dodging a car accident. But most modern chronic stressors, such as finances or a challenging relationship, keep your body in that heightened state, which hurts your health. Effects of Too Much Stress If constantly under stress, most of [place] will eventually start to function less well.  Multiple studies link chronic stress to a higher risk of heart disease, stroke, depression, weight gain, memory loss and even premature death, so it's important to recognize the warning signals. Talk to your doctor about ways  to manage stress if you're experiencing any of these symptoms: Prolonged periods of poor sleep. Regular, severe headaches. Unexplained weight loss or gain. Feelings of isolation, withdrawal or worthlessness. Constant anger and irritability. Loss of interest in activities. Constant worrying or obsessive thinking. Excessive alcohol or drug use. Inability to concentrate.  10 Ways to Cope with  Chronic Stress It's key to recognize stressful situations as they occur because it allows you to focus on managing how you react. We all need to know when to close our eyes and take a deep breath when we feel tension rising. Use these tips to prevent or reduce chronic stress. 1. Rebalance Work and Home All work and no play? If you're spending too much time at the office, intentionally put more dates in your calendar to enjoy time for fun, either alone or with others. 2. Get Regular Exercise Moving your body on a regular basis balances the nervous system and increases blood circulation, helping to flush out stress hormones. Even a daily 20-minute walk makes a difference. Any kind of exercise can lower stress and improve your mood ? just pick activities that you enjoy and make it a regular habit. 3. Eat Well and Limit Alcohol and Stimulants Alcohol, nicotine and caffeine may temporarily relieve stress but have negative health impacts and can make stress worse in the long run. Well-nourished bodies cope better, so start with a good breakfast, add more organic fruits and vegetables for a well-balanced diet, avoid processed foods and sugar, try herbal tea and drink more water. 4. Connect with Supportive People Talking face to face with another person releases hormones that reduce stress. Lean on those good listeners in your life. 5. [place] Time Do you enjoy gardening, reading, listening to music or some other creative pursuit? Engage in activities that bring you pleasure and joy; research shows that reduces stress by almost half and lowers your heart rate, too. 6. Practice Meditation, Stress Reduction or Yoga Relaxation techniques activate a state of restfulness that counterbalances your body's fight-or-flight hormones. Even if this also means a 10-minute break in a long day: listen to music, read, go for a walk in nature, do a hobby, take a bath or spend time with a friend. Also consider doing a  mindfulness exercise or try a daily deep breathing or imagery practice. Deep Breathing Slow, calm and deep breathing can help you relax. Try these steps to focus on your breathing and repeat as needed. Find a comfortable position and close your eyes. Exhale and drop your shoulders. Breathe in through your nose; fill your lungs and then your belly. Think of relaxing your body, quieting your mind and becoming calm and peaceful. Breathe out slowly through your nose, relaxing your belly. Think of releasing tension, pain, worries or distress. Repeat steps three and four until you feel relaxed. Imagery This involves using your mind to excite the senses -- sound, vision, smell, taste and feeling. This may help ease your stress. Begin by getting comfortable and then do some slow breathing. Imagine a place you love being at. It could be somewhere from your childhood, somewhere you vacationed or just a place in your imagination. Feel how it is to be in the place you're imagining. Pay attention to the sounds, air, colors, and who is there with you. This is a place where you feel cared for and loved. All is well. You are safe. Take in all the smells, sounds, tastes and feelings. As you do, feel your body  being nourished and healed. Feel the calm that surrounds you. Breathe in all the good. Breathe out any discomfort or tension. 7. Sleep Enough If you get less than seven to eight hours of sleep, your body won't tolerate stress as well as it could. If stress keeps you up at night, address the cause, and add extra meditation into your day to make up for the lost z's. Try to get seven to nine hours of sleep each night. Make a regular bedtime schedule. Keep your room dark and cool. Try to avoid computers, TV, cell phones and tablets before bed. 8. [place] with Connections You Enjoy Go out for a coffee with a friend, chat with a neighbor, call a family member, visit with a clergy member, or even hang out with your  pet. Clinical studies show that spending even a short time with a companion animal can cut anxiety levels almost in half. 9. Take a Vacation Getting away from it all can reset your stress tolerance by increasing your mental and emotional outlook, which makes you a happier, more productive person upon return. Leave your cellphone and laptop at home! 10. See a Counselor, Coach or Therapist If negative thoughts overwhelm your ability to make positive changes, it's time to seek professional help. Make an appointment today--your health and life are worth it."

## 2022-08-19 NOTE — Patient Outreach (Signed)
Medicaid Managed Care Social Work Note  08/19/2022 Name:  Maria Hudson MRN:  [id number] DOB:  Oct 27, 1960  Maria Hudson is an 62 y.o. year old female who is a primary patient of Nolene Ebbs, MD.  The Medicaid Managed Care Coordination team was consulted for assistance with:  [place] and Resources  [name] was given information about Medicaid Managed Care Coordination team services today. [name] Patient agreed to services and verbal consent obtained.  Engaged with patient  for by telephone forfollow up visit in response to referral for case management and/or care coordination services.   Assessments/Interventions:  Review of past medical history, allergies, medications, health status, including review of consultants reports, laboratory and other test data, was performed as part of comprehensive evaluation and provision of chronic care management services.  SDOH: (Social Determinant of Health) assessments and interventions performed: SDOH Interventions    Flowsheet Row Patient Outreach Telephone from 08/19/2022 in [place] Patient Outreach Telephone from 08/12/2022 in [place] Patient Outreach Telephone from 08/04/2022 in [place] Patient Outreach Telephone from 07/29/2022 in [place] Patient Outreach Telephone from 07/17/2022 in [place] Patient Outreach Telephone from 07/09/2022 in [place]  SDOH Interventions        Transportation Interventions -- -- Intervention Not Indicated -- -- --  Depression Interventions/Treatment  -- -- -- -- -- Counseling, Medication  Financial Strain Interventions -- -- Intervention Not Indicated -- -- --  Stress Interventions [place], Provide Counseling  [Patient reports being sick] [employer], Provide Counseling       Advanced Directives Status:  See Care Plan for related entries.  Care Plan                 Allergies  Allergen Reactions   Metformin And Related Nausea Only   Other     Clonidine patch causes skin irritation    Hydrocodone Itching    Patient able to tolerate when taken with Benadryl.   Oxycodone-Acetaminophen Itching    Patient able to tolerate when taken with Benadryl.    Medications Reviewed Today     Reviewed by Gayla Medicus, RN (Registered Nurse) on 08/04/22 at 1112  Med List Status: <None>   Medication Order Taking? Sig Documenting Provider Last Dose Status Informant  Accu-Chek Softclix Lancets lancets [id number] No 3 (three) times daily. [provider] Taking Active Self  acetaminophen (TYLENOL) 500 MG tablet [id number] No Take 1,000 mg by mouth every 6 (six) hours as needed for moderate pain or headache. [provider] Taking Active Self  ACIDOPHILUS LACTOBACILLUS PO [id number] No Take 1 Capful by mouth daily. [provider] Taking Active Self  allopurinol (ZYLOPRIM) 100 MG tablet [id number] No Take 100 mg by mouth in the morning. [provider] Taking Active Self  amLODipine (NORVASC) 10 MG tablet [id number] No Take 10 mg by mouth daily. [provider] Taking Active   aspirin EC 81 MG tablet [id number] No Take 81 mg by mouth in the morning. Swallow whole. [provider] Taking Active Self           Med Note [name]   Mon Apr 14, 2021  9:22 PM)    atorvastatin (LIPITOR) 40 MG tablet [id number] No Take 1 tablet (40  mg total) by mouth every evening. Skeet Latch, MD Taking Active Self  buPROPion [facility]) 100 MG tablet [id number] No Take 100 mg by mouth 2 (two) times daily. [provider] Taking Active  Self  carvedilol (COREG) 25 MG tablet [id number] No Take 1 tablet (25 mg total) by mouth 2 (two) times daily with a meal. [name], [name], MD Taking Expired 05/05/22 2359 Self  cholecalciferol (VITAMIN D3) 25 MCG (1000 UNIT) tablet [id number] No Take 2,000 Units by mouth daily. [provider] Taking Active Self  cloNIDine (CATAPRES) 0.3 MG tablet [id number] No Take 0.3 mg by mouth 3 (three) times daily. [provider] Taking Active Self           Med Note Stanford [place]   Tue Mar 03, 2022  9:23 AM)    dapagliflozin propanediol (FARXIGA) 10 MG TABS tablet [id number]  Take 1 tablet (10 mg total) by mouth daily before breakfast. Rafael Bihari, FNP  Active   diclofenac Sodium (VOLTAREN) 1 % GEL [id number] No Apply 2 g topically 4 (four) times daily as needed (chest pain.). [provider] Taking Active Self  ferrous sulfate 325 (65 FE) MG tablet [id number] No Take 325 mg by mouth 2 (two) times daily. [provider] Taking Active Self  fluticasone (FLONASE) 50 MCG/ACT nasal spray [id number] No Place 1 spray into both nostrils daily as needed for allergies or rhinitis. [provider] Taking Active Self  hydrALAZINE (APRESOLINE) 100 MG tablet [id number]  TAKE 1 TABLET BY MOUTH 3 TIMES DAILY. Skeet Latch, MD  Active   insulin glargine (LANTUS) 100 unit/mL SOPN [id number] No Inject 5 Units into the skin at bedtime. [provider] Taking Active Self           Med Note Stanford [place]   Tue Aug 19, 2021  9:37 AM)    isosorbide mononitrate (IMDUR) 60 MG 24 hr tablet [id number] No Take 1.5 tablets (90 mg total) by mouth daily. [place], Maricela Bo, FNP Taking Active Self  linaclotide Rolan Lipa) 145 MCG CAPS capsule [id number] No Take 1 capsule (145 mcg total) by mouth daily before breakfast. Zehr, Laban Emperor, PA-C Taking Active   loratadine (CLARITIN) 10 MG tablet [id number] No Take 10 mg by mouth in the morning. [provider] Taking  Active Self  Multiple Vitamin (MULTIVITAMIN ADULT) TABS [id number] No Take 1 tablet by mouth in the morning. [provider] Taking Active Self  nitroGLYCERIN (NITROSTAT) 0.4 MG SL tablet [id number] No Place 1 tablet (0.4 mg total) under the tongue every 5 (five) minutes as needed for chest pain. [name], [name], MD Taking Active Self           Med Note Gilman Buttner, Juanita Laster   Tue Aug 19, 2021  9:35 AM)    Cira Servant FLEXPEN 100 UNIT/ML FlexPen [id number] No Inject 3 Units into the skin 3 (three) times daily with meals. [provider] Taking Active Self           Med Note (ADSIDE, Tiney [place]   Fri Apr 17, 2022  8:30 AM)    Omega-3 Fatty Acids (FISH OIL) 1200 MG CAPS [id number] No Take 1,200 mg by mouth in the morning. [provider] Taking Active Self  Polyethylene Glycol 3350 (MIRALAX PO) [id number] No Take by mouth daily as needed. [provider] Taking Active   potassium chloride SA (KLOR-CON M) 20 MEQ tablet [id number] No Take 20 mEq by mouth daily. [provider] Taking Active  Med Note Jodi Mourning, CATHERINE T   Mon Apr 13, 2022  9:26 AM) Dewaine Conger ~ 3 times weekly, on days she takes torsemide 40 mg   pregabalin (LYRICA) 25 MG capsule [id number] No Take 25 mg by mouth 2 (two) times daily. [provider] Taking Active Self  Semaglutide, 1 MG/DOSE, (OZEMPIC, 1 MG/DOSE,) 4 MG/3ML SOPN [id number] No Inject 1 mg into the skin once a week. Buford Dresser, MD Taking Active   sertraline (ZOLOFT) 50 MG tablet [id number] No Take 50 mg by mouth in the morning. [provider] Taking Active Self  spironolactone (ALDACTONE) 25 MG tablet [id number] No Take 25 mg by mouth daily. [provider] Taking Active   torsemide (DEMADEX) 20 MG tablet [id number]  Take 1 tablet (20 mg total) by mouth daily as needed. [place], Frank, [place]  Active   traZODone (DESYREL) 50 MG tablet [id number] No Take 50 mg by mouth at bedtime. [provider] Taking Active Self  TURMERIC CURCUMIN PO [id number] No Take by mouth. [provider] Taking Active             Patient Active Problem List   Diagnosis Date Noted   Chronic constipation 03/31/2022   Pain due to onychomycosis of toenails of both feet 12/05/2021   Pure hypercholesterolemia 05/27/2021   Chronic diastolic CHF (congestive heart failure) ([place]) 04/29/2021   Hypertensive urgency 04/14/2021   Acute on chronic diastolic CHF (congestive heart failure) ([place]) 04/14/2021   Chest pain 03/05/2021   CAD (coronary artery disease) 03/05/2021   T2DM (type 2 diabetes mellitus) ([place]) 03/05/2021   CKD (chronic kidney disease) stage 4, GFR 15-29 ml/min ([place]) 03/05/2021   Class 3 obesity ([place]) 03/05/2021   CVA (cerebral vascular accident) ([place]) 03/05/2021   Acute on chronic diastolic heart failure ([place]) 03/05/2021   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   AKI (acute kidney injury) ([place]) 03/05/2021   Vitreous hemorrhage of left eye ([place]) 11/14/2020   Orthopnea 08/06/2020   Vision loss of right eye 08/06/2020   Excessive daytime sleepiness 01/11/2020   Iron deficiency anemia 12/14/2019   Vitreous hemorrhage, right ([place]) 12/04/2019   Diabetic neuropathy ([place]) 07/26/2019   Proliferative diabetic retinopathy of both 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 ([place]) 02/03/2019   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   Conductive hearing loss, bilateral 03/03/2018   Chronic bilateral low back pain without sciatica 08/21/2015   Chronic kidney disease (CKD) stage G3b/A3, moderately decreased glomerular filtration rate (GFR) between 30-44 mL/min/1.73 square meter and albuminuria creatinine ratio greater than 300 mg/g ([place]) 04/30/2015   Type 2 diabetes mellitus with diabetic polyneuropathy, with long-term current use of insulin ([place]) 02/15/2015   Advance directive on file 05/17/2014   Refusal of blood transfusions as patient is Jehovah's Witness 05/17/2014    OSA treated with BiPAP 02/23/2014   Coronary artery disease 07/07/2013   Severe obesity (BMI >= 40) ([place]) 05/18/2013   Neuropathy [id number]   Diastolic heart failure ([place]) 10/16/2012   Essential hypertension 10/16/2012    Conditions to be addressed/monitored per PCP order:  Anxiety and Depression  Care Plan : General Social Work (Adult)  Updates made by Greg Cutter, LCSW since 08/19/2022 12:00 AM     Problem: Anxiety Identification (Anxiety)      Long-Range Goal: Anxiety Symptoms Identified   Start Date: 05/16/2021  Priority: High  Note:    Timeframe:  Long-Range Goal Priority:  High Start Date:    07/09/22  Expected End Date:   ongoing                     Follow Up Date- 08/27/22 at 12:45   Current barriers:   Chronic Mental Health needs related to depression Mental Health Concerns  and Social Isolation Needs Support, Education, and Care Coordination in order to meet unmet mental health needs. Clinical Goal(s): demonstrate a reduction in symptoms related to :Depression: loss of energy/fatigue feelings of worthlessness, guilt  verbalize understanding of plan for management of Depression   Clinical Interventions:  Assessed patient's previous and current treatment, coping skills, support system and barriers to care  Depression screen reviewed  Active listening / Reflection utilized  Behavioral Activation reviewed Provided brief CBT  Suicidal Ideation/Homicidal Ideation assessed: Discussed Guardianship and reviewed process  [place] of Attorney  Discussed referral for psychiatry , patient does not wish to pursue a referral for psychiatry at this time Made referral to [facility] for counseling on 07/09/22 and email was sent to patient today as well with mental health resources Healthy self-care education was provided during 07/17/22 session due to patient being sick this past week [facility] LCSW 07/17/22 update- Patient reports that she has  already received a call from [facility] regarding the therapy referral [facility] LCSW made. She said she missed this call due to being sick and will make sure to contact them back for scheduling soon. Patient was encouraged to drink a lot of fluids to help hydrate her body and she was encouraged to get some rest. 07/29/22 Update- Patient reports that she is playing phone tag with [facility] but they are both trying to reach each other and have not been able to do so but she will return their last call and will notify [facility] LCSW if she continues to have issues. Patient reports that she is working actively on fighting her negative feelings and emotions in order to live her life more positively. LCSW 08/12/22 update- Patient reports that she has been unable to establish contact with [facility] so [facility] LCSW and patient completed joint call to agency and left a message. [facility] returned call to [facility] LCSW and stated that their therapist and has been trying to reach patient to get her scheduled but has been unable to reach her. Patient was called and provided this update and is agreeable to contacting therapist directly given the contact information left in her voice messages. Self-care education provided and an addition email was sent to her as well with resources/suggested self-care tips and tricks. [facility] LCSW 08/19/22 update- Patient reports that she has still been having issues establishing contact with [facility] so [facility] LCSW completed joint phone call with patient to agency and was able to successfully schedule her for in person bi weekly counseling sessions every other Monday at 5 pm. Patient's first appointment is next Monday on 08/24/22 at 5 pm. Email was sent to patient with appointment reminders and tips/tricks for self-care.  Review resources, discussed options and provided patient information about  Options for mental health treatment based on need and insurance  Inter-disciplinary care  team collaboration (see longitudinal plan of care) Patient Goals/Self-Care Activities: Over the next 120 days REFERRAL PLACED I have placed a referral for counseling at [facility] and they will contact you.  If you have not heard from them in for counseling at [facility] and  please follow up.    The following coping skill education was provided for  stress relief and mental health management: "When your car dies or a deadline looms, how do you respond? Long-term, low-grade or acute stress takes a serious toll on your body and mind, so don't ignore feelings of constant tension. Stress is a natural part of life. However, too much stress can harm our health, especially if it continues every day. This is chronic stress and can put you at risk for heart problems like heart disease and depression. Understand what's happening inside your body and learn simple coping skills to combat the negative impacts of everyday stressors.  Types of Stress There are two types of stress: Emotional - types of emotional stress are relationship problems, pressure at work, financial worries, experiencing discrimination or having a major life change. Physical - Examples of physical stress include being sick having pain, not sleeping well, recovery from an injury or having an alcohol and drug use disorder. Fight or Flight Sudden or ongoing stress activates your nervous system and floods your bloodstream with adrenaline and cortisol, two hormones that raise blood pressure, increase heart rate and spike blood sugar. These changes pitch your body into a fight or flight response. That enabled our ancestors to outrun saber-toothed tigers, and it's helpful today for situations like dodging a car accident. But most modern chronic stressors, such as finances or a challenging relationship, keep your body in that heightened state, which hurts your health. Effects of Too Much Stress If constantly under stress, most of [place] will  eventually start to function less well.  Multiple studies link chronic stress to a higher risk of heart disease, stroke, depression, weight gain, memory loss and even premature death, so it's important to recognize the warning signals. Talk to your doctor about ways to manage stress if you're experiencing any of these symptoms: Prolonged periods of poor sleep. Regular, severe headaches. Unexplained weight loss or gain. Feelings of isolation, withdrawal or worthlessness. Constant anger and irritability. Loss of interest in activities. Constant worrying or obsessive thinking. Excessive alcohol or drug use. Inability to concentrate.  10 Ways to Cope with Chronic Stress It's key to recognize stressful situations as they occur because it allows you to focus on managing how you react. We all need to know when to close our eyes and take a deep breath when we feel tension rising. Use these tips to prevent or reduce chronic stress. 1. Rebalance Work and Home All work and no play? If you're spending too much time at the office, intentionally put more dates in your calendar to enjoy time for fun, either alone or with others. 2. Get Regular Exercise Moving your body on a regular basis balances the nervous system and increases blood circulation, helping to flush out stress hormones. Even a daily 20-minute walk makes a difference. Any kind of exercise can lower stress and improve your mood ? just pick activities that you enjoy and make it a regular habit. 3. Eat Well and Limit Alcohol and Stimulants Alcohol, nicotine and caffeine may temporarily relieve stress but have negative health impacts and can make stress worse in the long run. Well-nourished bodies cope better, so start with a good breakfast, add more organic fruits and vegetables for a well-balanced diet, avoid processed foods and sugar, try herbal tea and drink more water. 4. Connect with Supportive People Talking face to face with another person  releases hormones that reduce stress. Lean on those good listeners in your life. [place] Time Do you enjoy gardening, reading, listening to music or  some other creative pursuit? Engage in activities that bring you pleasure and joy; research shows that reduces stress by almost half and lowers your heart rate, too. 6. Practice Meditation, Stress Reduction or Yoga Relaxation techniques activate a state of restfulness that counterbalances your body's fight-or-flight hormones. Even if this also means a 10-minute break in a long day: listen to music, read, go for a walk in nature, do a hobby, take a bath or spend time with a friend. Also consider doing a mindfulness exercise or try a daily deep breathing or imagery practice. Deep Breathing Slow, calm and deep breathing can help you relax. Try these steps to focus on your breathing and repeat as needed. Find a comfortable position and close your eyes. Exhale and drop your shoulders. Breathe in through your nose; fill your lungs and then your belly. Think of relaxing your body, quieting your mind and becoming calm and peaceful. Breathe out slowly through your nose, relaxing your belly. Think of releasing tension, pain, worries or distress. Repeat steps three and four until you feel relaxed. Imagery This involves using your mind to excite the senses -- sound, vision, smell, taste and feeling. This may help ease your stress. Begin by getting comfortable and then do some slow breathing. Imagine a place you love being at. It could be somewhere from your childhood, somewhere you vacationed or just a place in your imagination. Feel how it is to be in the place you're imagining. Pay attention to the sounds, air, colors, and who is there with you. This is a place where you feel cared for and loved. All is well. You are safe. Take in all the smells, sounds, tastes and feelings. As you do, feel your body being nourished and healed. Feel the calm that  surrounds you. Breathe in all the good. Breathe out any discomfort or tension. 7. Sleep Enough If you get less than seven to eight hours of sleep, your body won't tolerate stress as well as it could. If stress keeps you up at night, address the cause, and add extra meditation into your day to make up for the lost z's. Try to get seven to nine hours of sleep each night. Make a regular bedtime schedule. Keep your room dark and cool. Try to avoid computers, TV, cell phones and tablets before bed. 8. [place] with Connections You Enjoy Go out for a coffee with a friend, chat with a neighbor, call a family member, visit with a clergy member, or even hang out with your pet. Clinical studies show that spending even a short time with a companion animal can cut anxiety levels almost in half. 9. Take a Vacation Getting away from it all can reset your stress tolerance by increasing your mental and emotional outlook, which makes you a happier, more productive person upon return. Leave your cellphone and laptop at home! 10. See a Counselor, Coach or Therapist If negative thoughts overwhelm your ability to make positive changes, it's time to seek professional help. Make an appointment today--your health and life are worth it."        Follow up:  Patient agrees to Care Plan and Follow-up.  Plan: The Managed Medicaid care management team will reach out to the patient again over the next 30 days.  Date/time of next scheduled Social Work care management/care coordination outreach:  08/27/22 at 1245 pm  Eula Fried, [place], MSW, [place] Medicaid LCSW [email or web address] Phone: [phone]

## 2022-08-20 ENCOUNTER — Other Ambulatory Visit: Payer: Medicaid Other | Admitting: Pharmacist

## 2022-08-20 DIAGNOSIS — H4312 Vitreous hemorrhage, left eye: Secondary | ICD-10-CM | POA: Diagnosis not present

## 2022-08-20 DIAGNOSIS — E113513 Type 2 diabetes mellitus with proliferative diabetic retinopathy with macular edema, bilateral: Secondary | ICD-10-CM | POA: Diagnosis not present

## 2022-08-20 NOTE — Progress Notes (Signed)
08/20/2022 Name: Maria Hudson MRN: [id number] DOB: 18-Oct-1960  Chief Complaint  Patient presents with   Medication Management   Hypertension   Diabetes   Hyperlipidemia    Maria Hudson is a 62 y.o. year old female who presented for a telephone visit.   They were referred to the pharmacist by their Case Management Team  for assistance in managing diabetes, hypertension, and hyperlipidemia.   Patient is participating in a Managed Medicaid Plan:  Yes  Subjective:  Care Team: Primary Care Provider: Nolene Ebbs, MD  Medication Access/Adherence  Current Pharmacy:  [id number] [place] [id number]: [phone]: [phone]  Patient reports affordability concerns with their medications: No  Patient reports access/transportation concerns to their pharmacy: No  Patient reports adherence concerns with their medications:  No  =   Diabetes:  Current medications: Ozempic 1 mg weekly, Farxiga 10 mg daily, Lantus 3-10 units daily PRN; Novolog 3-8 units with meals PRN   Patient reports hypoglycemic s/sx including dizziness, shakiness, sweating. Reports she has noticed differences between DexCom and finger stick readings; realizes she sometimes has lows if she over corrects with insulin.   Current meal patterns: reports she's had some grief in the past few weeks.   Hypertension:  Current medications: amlodipine 10 mg daily, clonidine 0.3 mg three times daily, hydralazine 100 mg, isosorbide 120 mg daily, carvedilol 25 mg twice daily, spironolactone 50 mg daily   Patient has a validated, automated, upper arm home BP cuff Current blood pressure readings readings: SBP 160-180; DBP 70-80s  Working closely with nephrology, PCP. Spironolactone was increased yesterday. Reports she follows up with FRoyce Macadamiain 1-2 weeks.   Patient reports hypotensive s/sx including dizziness,  lightheadedness.  Patient reports hypertensive symptoms including headache, chest pain, shortness of breath  Hyperlipidemia/ASCVD Risk Reduction  Current lipid lowering medications: atorvastatin 40 mg daily   Antiplatelet regimen: aspirin 81 mg daily   Objective:  Lab Results  Component Value Date   HGBA1C 6.7 (H) 04/29/2021    Lab Results  Component Value Date   CREATININE 2.52 (H) 04/17/2022   BUN 67 (H) 04/17/2022   NA 139 04/17/2022   K 4.1 04/17/2022   CL 106 04/17/2022   CO2 22 04/17/2022    Lab Results  Component Value Date   CHOL 156 09/25/2021   HDL 58 09/25/2021   LDLCALC 78 09/25/2021   TRIG 109 09/25/2021   CHOLHDL 2.7 09/25/2021    Medications Reviewed Today     Reviewed by HOsker Mason RPH-CPP (Pharmacist) on 08/20/22 at 1100 Med List Status: <None>   Medication Order Taking? Sig Documenting Provider Last Dose Status Informant  Accu-Chek Softclix Lancets lancets [id number] 3 (three) times daily. [provider] Taking Active Self  acetaminophen (TYLENOL) 500 MG tablet [id number] Take 1,000 mg by mouth every 6 (six) hours as needed for moderate pain or headache. [provider] Taking Active Self  allopurinol (ZYLOPRIM) 100 MG tablet [id number] Take 100 mg by mouth in the morning. [provider] Taking Active Self  amLODipine (NORVASC) 10 MG tablet [id number] Take 10 mg by mouth daily. [provider] Taking Active   aspirin EC 81 MG tablet [id number] Take 81 mg by mouth in the morning. Swallow whole. [provider] Taking Active Self           Med Note (JBroadus John [name]  Mon Apr 14, 2021  9:22 PM)    atorvastatin (LIPITOR) 40 MG tablet [id number] Yes Take 1 tablet (40 mg total) by mouth every evening. Skeet Latch, MD Taking Active Self  buPROPion [facility]) 100 MG tablet [id number] Yes Take 100 mg by mouth 2 (two) times daily. [provider] Taking Active Self   carvedilol (COREG) 25 MG tablet [id number] Yes Take 1 tablet (25 mg total) by mouth 2 (two) times daily with a meal. [name], [name], MD Taking Active Self  cholecalciferol (VITAMIN D3) 25 MCG (1000 UNIT) tablet [id number] Yes Take 2,000 Units by mouth daily. [provider] Taking Active Self  cloNIDine (CATAPRES) 0.3 MG tablet [id number] Yes Take 0.3 mg by mouth 3 (three) times daily. [provider] Taking Active Self           Med Note Stanford [place]   Tue Mar 03, 2022  9:23 AM)    Continuous Blood Gluc Transmit (DEXCOM G6 TRANSMITTER) MISC [id number] Yes Use one every 3 month [provider]  Active   dapagliflozin propanediol (FARXIGA) 10 MG TABS tablet [id number] Yes Take 1 tablet (10 mg total) by mouth daily before breakfast. Rafael Bihari, FNP Taking Active   ferrous sulfate 325 (65 FE) MG tablet [id number] Yes Take 325 mg by mouth 2 (two) times daily. [provider] Taking Active Self  fluticasone (FLONASE) 50 MCG/ACT nasal spray [id number] Yes Place 1 spray into both nostrils daily as needed for allergies or rhinitis. [provider] Taking Active Self  hydrALAZINE (APRESOLINE) 100 MG tablet [id number] Yes TAKE 1 TABLET BY MOUTH 3 TIMES DAILY. Skeet Latch, MD Taking Active   insulin glargine (LANTUS) 100 unit/mL SOPN [id number] Yes Inject 5 Units into the skin at bedtime. [provider] Taking Active Self           Med Note Jodi Mourning, Fey Coghill T   Thu Aug 20, 2022 10:26 AM) Taking 3-10 units QPM  isosorbide mononitrate (IMDUR) 120 MG 24 hr tablet [id number] Yes Take 120 mg by mouth daily. [provider] Taking Active   linaclotide Rolan Lipa) 145 MCG CAPS capsule [id number] Yes Take 1 capsule (145 mcg total) by mouth daily before breakfast. Zehr, Laban Emperor, PA-C Taking Active   loratadine (CLARITIN) 10 MG tablet [id number] Yes Take 10 mg by mouth in the morning. [provider] Taking Active Self  Multiple  Vitamin (MULTIVITAMIN ADULT) TABS [id number] Yes Take 1 tablet by mouth in the morning. [provider] Taking Active Self  nitroGLYCERIN (NITROSTAT) 0.4 MG SL tablet [id number]  Place 1 tablet (0.4 mg total) under the tongue every 5 (five) minutes as needed for chest pain. [name], [name], MD  Active Self           Med Note Gilman Buttner, Juanita Laster   Tue Aug 19, 2021  9:35 AM)    Cira Servant FLEXPEN 100 UNIT/ML FlexPen [id number] Yes Inject 3 Units into the skin 3 (three) times daily with meals. [provider] Taking Active Self           Med Note Jodi Mourning, Terrel Nesheiwat T   Thu Aug 20, 2022 10:26 AM) 3-8 units with sliding scale  Omega-3 Fatty Acids (FISH OIL) 1200 MG CAPS [id number] Yes Take 1,200 mg by mouth in the morning. [provider] Taking Active Self  Polyethylene Glycol 3350 (MIRALAX PO) [id number] Yes Take by mouth daily as needed. [provider] Taking Active   pregabalin (LYRICA) 25 MG capsule [id number] Yes Take 25 mg by  mouth 2 (two) times daily. [provider] Taking Active Self  Semaglutide, 1 MG/DOSE, (OZEMPIC, 1 MG/DOSE,) 4 MG/3ML SOPN [id number] Yes Inject 1 mg into the skin once a week. Buford Dresser, MD Taking Active   sertraline (ZOLOFT) 50 MG tablet [id number] Yes Take 50 mg by mouth in the morning. [provider] Taking Active Self  spironolactone (ALDACTONE) 50 MG tablet [id number] Yes Take 50 mg by mouth daily. [provider] Taking Active   torsemide (DEMADEX) 20 MG tablet [id number] Yes Take 1 tablet (20 mg total) by mouth daily as needed.  Patient taking differently: Take 20-40 mg by mouth daily as needed.   [place], Maricela Bo, FNP Taking Active   traZODone (DESYREL) 50 MG tablet [id number] Yes Take 50 mg by mouth at bedtime. [provider] Taking Active Self  TURMERIC CURCUMIN PO [id number] Yes Take 500 mg by mouth daily at 6 (six) AM. [provider] Taking Active                Assessment/Plan:   Diabetes: - Currently controlled - Reviewed long term cardiovascular and renal outcomes of uncontrolled blood sugar - Reviewed goal A1c, goal fasting, and goal 2 hour post prandial glucose - Recommend to avoid PRN dosing of basal insulin. Continue to follow up with nephrology, PCP, endocrinology. Discussed differences between blood glucose and interstitial glucose readings that can result in differences in readings.   - Encouraged to continue current regimen at this time  Hypertension: - Currently uncontrolled - Reviewed long term cardiovascular and renal outcomes of uncontrolled blood pressure - Reviewed appropriate blood pressure monitoring technique and reviewed goal blood pressure. Recommended to check home blood pressure and heart rate daily.  - Recommend to continue regimen and close follow up with nephrology as scheduled  Hyperlipidemia/ASCVD Risk Reduction: - Currently controlled.  - Recommend to continue current regimen at this time   Follow Up Plan: pharmacy needs met. Follow up with case management team a scheduled  Catie Hedwig Morton, PharmD, [place], [place] [phone]

## 2022-08-20 NOTE — Patient Instructions (Signed)
Calise,   It was great talking with you! Keep up the fantastic work.   Let us know if you have any questions or concerns.   Catie Hedwig Morton, PharmD, [place], [facility] [phone]

## 2022-08-24 DIAGNOSIS — F4321 Adjustment disorder with depressed mood: Secondary | ICD-10-CM | POA: Diagnosis not present

## 2022-08-27 ENCOUNTER — Other Ambulatory Visit: Payer: Medicaid Other | Admitting: Licensed Clinical Social Worker

## 2022-08-27 NOTE — Patient Instructions (Signed)
Visit Information  Maria Hudson was given information about Medicaid Managed Care team care coordination services as a part of their [facility] Medicaid benefit. Mayuri Shenton verbally consented to engagement with the [facility] Managed Care team.   If you are experiencing a medical emergency, please call 911 or report to your local emergency department or urgent care.   If you have a non-emergency medical problem during routine business hours, please contact your provider's office and ask to speak with a nurse.   For questions related to your [facility] health plan, please call: [phone] or go here:[email or web address]  If you would like to schedule transportation through your [facility] plan, please call the following number at least 2 days in advance of your appointment: [phone].  You can also use the MTM portal or MTM mobile app to manage your rides. For the portal, please go to [email or web address].  Call the [place] at [phone], at any time, 24 hours a day, 7 days a week. If you are in danger or need immediate medical attention call 911.  If you would like help to quit smoking, call 1-800-QUIT-NOW (617)313-7748) OR Espaol: 1-855-Djelo-Ya QO:409462) o para ms informacin haga clic aqu or Text READY to 200-400 to register via text   Following is a copy of your plan of care:  Care Plan : General Social Work (Adult)  Updates made by Greg Cutter, LCSW since 08/27/2022 12:00 AM     Problem: Anxiety Identification (Anxiety)      Long-Range Goal: Anxiety Symptoms Identified   Start Date: 05/16/2021  Priority: High  Note:    Timeframe:  Long-Range Goal Priority:  High Start Date:    07/09/22                 Expected End Date:   ongoing                     Follow Up Date- 09/17/22 at 3 pm  Current barriers:   Chronic Mental Health needs related to depression Mental Health Concerns  and Social Isolation Needs Support,  Education, and Care Coordination in order to meet unmet mental health needs. Clinical Goal(s): demonstrate a reduction in symptoms related to :Depression: loss of energy/fatigue feelings of worthlessness, guilt  verbalize understanding of plan for management of Depression    The following coping skill education was provided for stress relief and mental health management: "When your car dies or a deadline looms, how do you respond? Long-term, low-grade or acute stress takes a serious toll on your body and mind, so don't ignore feelings of constant tension. Stress is a natural part of life. However, too much stress can harm our health, especially if it continues every day. This is chronic stress and can put you at risk for heart problems like heart disease and depression. Understand what's happening inside your body and learn simple coping skills to combat the negative impacts of everyday stressors.  Types of Stress There are two types of stress: Emotional - types of emotional stress are relationship problems, pressure at work, financial worries, experiencing discrimination or having a major life change. Physical - Examples of physical stress include being sick having pain, not sleeping well, recovery from an injury or having an alcohol and drug use disorder. Fight or Flight Sudden or ongoing stress activates your nervous system and floods your bloodstream with adrenaline and cortisol, two hormones that raise blood pressure, increase heart rate and spike blood sugar. These changes pitch  your body into a fight or flight response. That enabled our ancestors to outrun saber-toothed tigers, and it's helpful today for situations like dodging a car accident. But most modern chronic stressors, such as finances or a challenging relationship, keep your body in that heightened state, which hurts your health. Effects of Too Much Stress If constantly under stress, most of [place] will eventually start to function less  well.  Multiple studies link chronic stress to a higher risk of heart disease, stroke, depression, weight gain, memory loss and even premature death, so it's important to recognize the warning signals. Talk to your doctor about ways to manage stress if you're experiencing any of these symptoms: Prolonged periods of poor sleep. Regular, severe headaches. Unexplained weight loss or gain. Feelings of isolation, withdrawal or worthlessness. Constant anger and irritability. Loss of interest in activities. Constant worrying or obsessive thinking. Excessive alcohol or drug use. Inability to concentrate.  10 Ways to Cope with Chronic Stress It's key to recognize stressful situations as they occur because it allows you to focus on managing how you react. We all need to know when to close our eyes and take a deep breath when we feel tension rising. Use these tips to prevent or reduce chronic stress. 1. Rebalance Work and Home All work and no play? If you're spending too much time at the office, intentionally put more dates in your calendar to enjoy time for fun, either alone or with others. 2. Get Regular Exercise Moving your body on a regular basis balances the nervous system and increases blood circulation, helping to flush out stress hormones. Even a daily 20-minute walk makes a difference. Any kind of exercise can lower stress and improve your mood ? just pick activities that you enjoy and make it a regular habit. 3. Eat Well and Limit Alcohol and Stimulants Alcohol, nicotine and caffeine may temporarily relieve stress but have negative health impacts and can make stress worse in the long run. Well-nourished bodies cope better, so start with a good breakfast, add more organic fruits and vegetables for a well-balanced diet, avoid processed foods and sugar, try herbal tea and drink more water. 4. Connect with Supportive People Talking face to face with another person releases hormones that reduce  stress. Lean on those good listeners in your life. 5. [place] Time Do you enjoy gardening, reading, listening to music or some other creative pursuit? Engage in activities that bring you pleasure and joy; research shows that reduces stress by almost half and lowers your heart rate, too. 6. Practice Meditation, Stress Reduction or Yoga Relaxation techniques activate a state of restfulness that counterbalances your body's fight-or-flight hormones. Even if this also means a 10-minute break in a long day: listen to music, read, go for a walk in nature, do a hobby, take a bath or spend time with a friend. Also consider doing a mindfulness exercise or try a daily deep breathing or imagery practice. Deep Breathing Slow, calm and deep breathing can help you relax. Try these steps to focus on your breathing and repeat as needed. Find a comfortable position and close your eyes. Exhale and drop your shoulders. Breathe in through your nose; fill your lungs and then your belly. Think of relaxing your body, quieting your mind and becoming calm and peaceful. Breathe out slowly through your nose, relaxing your belly. Think of releasing tension, pain, worries or distress. Repeat steps three and four until you feel relaxed. Imagery This involves using your mind to  excite the senses -- sound, vision, smell, taste and feeling. This may help ease your stress. Begin by getting comfortable and then do some slow breathing. Imagine a place you love being at. It could be somewhere from your childhood, somewhere you vacationed or just a place in your imagination. Feel how it is to be in the place you're imagining. Pay attention to the sounds, air, colors, and who is there with you. This is a place where you feel cared for and loved. All is well. You are safe. Take in all the smells, sounds, tastes and feelings. As you do, feel your body being nourished and healed. Feel the calm that surrounds you. Breathe in all the  good. Breathe out any discomfort or tension. 7. Sleep Enough If you get less than seven to eight hours of sleep, your body won't tolerate stress as well as it could. If stress keeps you up at night, address the cause, and add extra meditation into your day to make up for the lost z's. Try to get seven to nine hours of sleep each night. Make a regular bedtime schedule. Keep your room dark and cool. Try to avoid computers, TV, cell phones and tablets before bed. 8. [place] with Connections You Enjoy Go out for a coffee with a friend, chat with a neighbor, call a family member, visit with a clergy member, or even hang out with your pet. Clinical studies show that spending even a short time with a companion animal can cut anxiety levels almost in half. 9. Take a Vacation Getting away from it all can reset your stress tolerance by increasing your mental and emotional outlook, which makes you a happier, more productive person upon return. Leave your cellphone and laptop at home! 10. See a Counselor, Coach or Therapist If negative thoughts overwhelm your ability to make positive changes, it's time to seek professional help. Make an appointment today--your health and life are worth it."

## 2022-08-27 NOTE — Patient Outreach (Signed)
Medicaid Managed Care Social Work Note  08/27/2022 Name:  Maria Hudson MRN:  [id number] DOB:  1961-05-29  Maria Hudson is an 62 y.o. year old female who is a primary patient of Maria Ebbs, MD.  The Medicaid Managed Care Coordination team was consulted for assistance with:  [place] and Resources  Maria Hudson was given information about Medicaid Managed Care Coordination team services today. Maria Hudson Patient agreed to services and verbal consent obtained.  Engaged with patient  for by telephone forfollow up visit in response to referral for case management and/or care coordination services.   Assessments/Interventions:  Review of past medical history, allergies, medications, health status, including review of consultants reports, laboratory and other test data, was performed as part of comprehensive evaluation and provision of chronic care management services.  SDOH: (Social Determinant of Health) assessments and interventions performed: SDOH Interventions    Flowsheet Row Patient Outreach from 08/20/2022 in [place] Patient Outreach Telephone from 08/19/2022 in [place] Patient Outreach Telephone from 08/12/2022 in [place] Patient Outreach Telephone from 08/04/2022 in [place] Patient Outreach Telephone from 07/29/2022 in [place] Patient Outreach Telephone from 07/17/2022 in [place]  SDOH Interventions        Transportation Interventions -- -- -- Intervention Not Indicated -- --  Financial Strain Interventions Intervention Not Indicated -- -- Intervention Not Indicated -- --  Stress Interventions -- [place], Provide Counseling  [Patient reports being sick]       Advanced Directives Status:  See Care Plan for related entries.  Care Plan                 Allergies  Allergen Reactions   Metformin And Related Nausea Only   Other     Clonidine patch causes skin irritation    Hydrocodone Itching    Patient able to tolerate when taken with Benadryl.   Oxycodone-Acetaminophen Itching    Patient able to tolerate when taken with Benadryl.    Medications Reviewed Today     Reviewed by Maria Hudson (Pharmacist) on 08/20/22 at 1028  Med List Status: <None>   Medication Order Taking? Sig Documenting Provider Last Dose Status Informant  Accu-Chek Softclix Lancets lancets [id number] Yes 3 (three) times daily. [provider] Taking Active Self  acetaminophen (TYLENOL) 500 MG tablet [id number] Yes Take 1,000 mg by mouth every 6 (six) hours as needed for moderate pain or headache. [provider] Taking Active Self  allopurinol (ZYLOPRIM) 100 MG tablet [id number] Yes Take 100 mg by mouth in the morning. [provider] Taking Active Self  amLODipine (NORVASC) 10 MG tablet [id number] Yes Take 10 mg by mouth daily. [provider] Taking Active   aspirin EC 81 MG tablet [id number] Yes Take 81 mg by mouth in the morning. Swallow whole. [provider] Taking Active Self           Med Note [name]   Mon Apr 14, 2021  9:22 PM)    atorvastatin (LIPITOR) 40 MG tablet [id number] Yes Take 1 tablet (40 mg total) by mouth every evening. Maria Latch, MD Taking Active Self  buPROPion [facility]) 100 MG tablet [id number] Yes Take 100 mg by mouth 2 (two) times daily. [provider] Taking Active  Self  carvedilol (COREG) 25 MG tablet [id number] Yes Take 1 tablet (25 mg total) by mouth 2 (two) times daily with a meal. [name], [name], MD Taking Active Self  cholecalciferol (VITAMIN D3) 25 MCG (1000 UNIT) tablet  [id number] Yes Take 2,000 Units by mouth daily. [provider] Taking Active Self  cloNIDine (CATAPRES) 0.3 MG tablet [id number] Yes Take 0.3 mg by mouth 3 (three) times daily. [provider] Taking Active Self           Med Note Maria Hudson   Tue Mar 03, 2022  9:23 AM)    Continuous Blood Gluc Transmit (DEXCOM G6 TRANSMITTER) MISC [id number] Yes Use one every 3 month [provider]  Active   dapagliflozin propanediol (FARXIGA) 10 MG TABS tablet [id number] Yes Take 1 tablet (10 mg total) by mouth daily before breakfast. Maria Hudson Taking Active   ferrous sulfate 325 (65 FE) MG tablet [id number] Yes Take 325 mg by mouth 2 (two) times daily. [provider] Taking Active Self  fluticasone (FLONASE) 50 MCG/ACT nasal spray [id number] Yes Place 1 spray into both nostrils daily as needed for allergies or rhinitis. [provider] Taking Active Self  hydrALAZINE (APRESOLINE) 100 MG tablet [id number] Yes TAKE 1 TABLET BY MOUTH 3 TIMES DAILY. Maria Latch, MD Taking Active   insulin glargine (LANTUS) 100 unit/mL SOPN [id number] Yes Inject 5 Units into the skin at bedtime. [provider] Taking Active Self           Med Note Maria Hudson   Thu Aug 20, 2022 10:26 AM) Taking 3-10 units QPM  isosorbide mononitrate (IMDUR) 120 MG 24 hr tablet [id number] Yes Take 120 mg by mouth daily. [provider] Taking Active   linaclotide Rolan Lipa) 145 MCG CAPS capsule [id number] Yes Take 1 capsule (145 mcg total) by mouth daily before breakfast. Zehr, Laban Emperor, PA-C Taking Active   loratadine (CLARITIN) 10 MG tablet [id number] Yes Take 10 mg by mouth in the morning. [provider] Taking Active Self  Multiple Vitamin (MULTIVITAMIN ADULT) TABS [id number] Yes Take 1 tablet by mouth in the morning. [provider] Taking Active Self  nitroGLYCERIN (NITROSTAT) 0.4 MG SL tablet [id number]  Place 1 tablet (0.4 mg total)  under the tongue every 5 (five) minutes as needed for chest pain. [name], [name], MD  Active Self           Med Note Maria Hudson   Tue Aug 19, 2021  9:35 AM)    Cira Servant FLEXPEN 100 UNIT/ML FlexPen [id number] Yes Inject 3 Units into the skin 3 (three) times daily with meals. [provider] Taking Active Self           Med Note Maria Hudson   Thu Aug 20, 2022 10:26 AM) 3-8 units with sliding scale  Omega-3 Fatty Acids (FISH OIL) 1200 MG CAPS [id number] Yes Take 1,200 mg by mouth in the morning. [provider] Taking Active Self  Polyethylene Glycol 3350 (MIRALAX PO) [id number] Yes Take by mouth daily as needed. [provider] Taking Active   pregabalin (LYRICA) 25 MG capsule [id number] Yes Take 25 mg by mouth 2 (two) times daily. [provider] Taking Active Self  Semaglutide, 1 MG/DOSE, (OZEMPIC, 1 MG/DOSE,) 4 MG/3ML SOPN [id number] Yes Inject 1 mg into the skin once a week. Buford Dresser, MD Taking Active   sertraline (ZOLOFT) 50 MG tablet [id number] Yes Take 50 mg by mouth in the morning. [provider] Taking Active Self  spironolactone (ALDACTONE) 50 MG tablet [id number] Yes Take 50 mg by mouth daily. [provider] Taking Active   torsemide (DEMADEX) 20 MG tablet [id number] Yes Take 1 tablet (20 mg total) by mouth daily as needed.  Patient taking differently: Take 20-40 mg by mouth daily as needed.   [place], Maricela Bo, [place] Taking Active   traZODone (DESYREL) 50 MG tablet [id number] Yes Take 50 mg by mouth at bedtime. [provider] Taking Active Self  TURMERIC CURCUMIN PO [id number] Yes Take 500 mg by mouth daily at 6 (six) AM. [provider] Taking Active             Patient Active Problem List   Diagnosis Date Noted   Chronic constipation 03/31/2022   Pain due to onychomycosis of toenails of both feet 12/05/2021   Pure hypercholesterolemia 05/27/2021   Chronic diastolic CHF  (congestive heart failure) ([place]) 04/29/2021   Hypertensive urgency 04/14/2021   Acute on chronic diastolic CHF (congestive heart failure) ([place]) 04/14/2021   Chest pain 03/05/2021   CAD (coronary artery disease) 03/05/2021   T2DM (type 2 diabetes mellitus) ([place]) 03/05/2021   CKD (chronic kidney disease) stage 4, GFR 15-29 ml/min ([place]) 03/05/2021   Class 3 obesity ([place]) 03/05/2021   CVA (cerebral vascular accident) ([place]) 03/05/2021   Acute on chronic diastolic heart failure ([place]) 03/05/2021   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   AKI (acute kidney injury) ([place]) 03/05/2021   Vitreous hemorrhage of left eye ([place]) 11/14/2020   Orthopnea 08/06/2020   Vision loss of right eye 08/06/2020   Excessive daytime sleepiness 01/11/2020   Iron deficiency anemia 12/14/2019   Vitreous hemorrhage, right ([place]) 12/04/2019   Diabetic neuropathy ([place]) 07/26/2019   Proliferative diabetic retinopathy of both 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 ([place]) 02/03/2019   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   Conductive hearing loss, bilateral 03/03/2018   Chronic bilateral low back pain without sciatica 08/21/2015   Chronic kidney disease (CKD) stage G3b/A3, moderately decreased glomerular filtration rate (GFR) between 30-44 mL/min/1.73 square meter and albuminuria creatinine ratio greater than 300 mg/g ([place]) 04/30/2015   Type 2 diabetes mellitus with diabetic polyneuropathy, with long-term current use of insulin ([place]) 02/15/2015   Advance directive on file 05/17/2014   Refusal of blood transfusions as patient is Jehovah's Witness 05/17/2014   OSA treated with BiPAP 02/23/2014   Coronary artery disease 07/07/2013   Severe obesity (BMI >= 40) ([place]) 05/18/2013   Neuropathy [id number]   Diastolic heart failure ([place]) 10/16/2012   Essential hypertension 10/16/2012    Conditions to be addressed/monitored per PCP order:  Anxiety  Care Plan : General Social Work (Adult)  Updates made by Greg Cutter, LCSW since 08/27/2022 12:00 AM     Problem: Anxiety Identification (Anxiety)      Long-Range Goal: Anxiety Symptoms Identified   Start Date: 05/16/2021  Priority: High  Note:    Timeframe:  Long-Range Goal Priority:  High Start Date:    07/09/22                 Expected End Date:   ongoing                     Follow Up Date- 09/17/22 at 3 pm  Current barriers:   Chronic Mental Health needs related to depression Mental Health Concerns  and Social Isolation Needs Support, Education, and Care Coordination in order to meet unmet mental health  needs. Clinical Goal(s): demonstrate a reduction in symptoms related to :Depression: loss of energy/fatigue feelings of worthlessness, guilt  verbalize understanding of plan for management of Depression   Clinical Interventions:  Assessed patient's previous and current treatment, coping skills, support system and barriers to care  Depression screen reviewed  Active listening / Reflection utilized  Behavioral Activation reviewed Provided brief CBT  Suicidal Ideation/Homicidal Ideation assessed: Discussed Guardianship and reviewed process  [place] of Attorney  Discussed referral for psychiatry , patient does not wish to pursue a referral for psychiatry at this time Made referral to [facility] for counseling on 07/09/22 and email was sent to patient today as well with mental health resources Healthy self-care education was provided during 07/17/22 session due to patient being sick this past week [facility] LCSW 07/17/22 update- Patient reports that she has already received a call from [facility] regarding the therapy referral [facility] LCSW made. She said she missed this call due to being sick and will make sure to contact them back for scheduling soon. Patient was encouraged to drink a lot of fluids to help hydrate her body and she was encouraged to get some rest. 07/29/22 Update- Patient reports that she is playing phone tag  with [facility] but they are both trying to reach each other and have not been able to do so but she will return their last call and will notify [facility] LCSW if she continues to have issues. Patient reports that she is working actively on fighting her negative feelings and emotions in order to live her life more positively. LCSW 08/12/22 update- Patient reports that she has been unable to establish contact with [facility] so [facility] LCSW and patient completed joint call to agency and left a message. [facility] returned call to [place] LCSW and stated that their therapist and has been trying to reach patient to get her scheduled but has been unable to reach her. Patient was called and provided this update and is agreeable to contacting therapist directly given the contact information left in her voice messages. Self-care education provided and an addition email was sent to her as well with resources/suggested self-care tips and tricks. [facility] LCSW 08/19/22 update- Patient reports that she has still been having issues establishing contact with [facility] so [facility] LCSW completed joint phone call with patient to agency and was able to successfully schedule her for in person bi weekly counseling sessions every other Monday at 5 pm. Patient's first appointment is next Monday on 08/24/22 at 5 pm. Email was sent to patient with appointment reminders and tips/tricks for self-care. Patient has not been successfully established with a long term therapist at [place] and will have weekly therapy sessions on Mondays at 9 am. Patient reports having a successful first session with her new therapist.   Review resources, discussed options and provided patient information about  Options for mental health treatment based on need and insurance  Inter-disciplinary care team collaboration (see longitudinal plan of care) Patient Goals/Self-Care Activities: Over the next 120 days REFERRAL PLACED I have placed a referral  for counseling at [facility] and they will contact you.  If you have not heard from them in for counseling at [facility] and  please follow up.  The following coping skill education was provided for stress relief and mental health management: "When your car dies or a deadline looms, how do you respond? Long-term, low-grade or acute stress takes a serious toll on your body  and mind, so don'[place] ignore feelings of constant tension. Stress is a natural part of life. However, too much stress can harm our health, especially if it continues every day. This is chronic stress and can put you at risk for heart problems like heart disease and depression. Understand what's happening inside your body and learn simple coping skills to combat the negative impacts of everyday stressors.  Types of Stress There are two types of stress: Emotional - types of emotional stress are relationship problems, pressure at work, financial worries, experiencing discrimination or having a major life change. Physical - Examples of physical stress include being sick having pain, not sleeping well, recovery from an injury or having an alcohol and drug use disorder. Fight or Flight Sudden or ongoing stress activates your nervous system and floods your bloodstream with adrenaline and cortisol, two hormones that raise blood pressure, increase heart rate and spike blood sugar. These changes pitch your body into a fight or flight response. That enabled our ancestors to outrun saber-toothed tigers, and it's helpful today for situations like dodging a car accident. But most modern chronic stressors, such as finances or a challenging relationship, keep your body in that heightened state, which hurts your health. Effects of Too Much Stress If constantly under stress, most of [place] will eventually start to function less well.  Multiple studies link chronic stress to a higher risk of heart disease, stroke, depression, weight gain, memory loss  and even premature death, so it's important to recognize the warning signals. Talk to your doctor about ways to manage stress if you're experiencing any of these symptoms: Prolonged periods of poor sleep. Regular, severe headaches. Unexplained weight loss or gain. Feelings of isolation, withdrawal or worthlessness. Constant anger and irritability. Loss of interest in activities. Constant worrying or obsessive thinking. Excessive alcohol or drug use. Inability to concentrate.  10 Ways to Cope with Chronic Stress It's key to recognize stressful situations as they occur because it allows you to focus on managing how you react. We all need to know when to close our eyes and take a deep breath when we feel tension rising. Use these tips to prevent or reduce chronic stress. 1. Rebalance Work and Home All work and no play? If you're spending too much time at the office, intentionally put more dates in your calendar to enjoy time for fun, either alone or with others. 2. Get Regular Exercise Moving your body on a regular basis balances the nervous system and increases blood circulation, helping to flush out stress hormones. Even a daily 20-minute walk makes a difference. Any kind of exercise can lower stress and improve your mood ? just pick activities that you enjoy and make it a regular habit. 3. Eat Well and Limit Alcohol and Stimulants Alcohol, nicotine and caffeine may temporarily relieve stress but have negative health impacts and can make stress worse in the long run. Well-nourished bodies cope better, so start with a good breakfast, add more organic fruits and vegetables for a well-balanced diet, avoid processed foods and sugar, try herbal tea and drink more water. 4. Connect with Supportive People Talking face to face with another person releases hormones that reduce stress. Lean on those good listeners in your life. 5. [place] Time Do you enjoy gardening, reading, listening to music  or some other creative pursuit? Engage in activities that bring you pleasure and joy; research shows that reduces stress by almost half and lowers your heart rate, too. 6. Practice Meditation,  Stress Reduction or Yoga Relaxation techniques activate a state of restfulness that counterbalances your body's fight-or-flight hormones. Even if this also means a 10-minute break in a long day: listen to music, read, go for a walk in nature, do a hobby, take a bath or spend time with a friend. Also consider doing a mindfulness exercise or try a daily deep breathing or imagery practice. Deep Breathing Slow, calm and deep breathing can help you relax. Try these steps to focus on your breathing and repeat as needed. Find a comfortable position and close your eyes. Exhale and drop your shoulders. Breathe in through your nose; fill your lungs and then your belly. Think of relaxing your body, quieting your mind and becoming calm and peaceful. Breathe out slowly through your nose, relaxing your belly. Think of releasing tension, pain, worries or distress. Repeat steps three and four until you feel relaxed. Imagery This involves using your mind to excite the senses -- sound, vision, smell, taste and feeling. This may help ease your stress. Begin by getting comfortable and then do some slow breathing. Imagine a place you love being at. It could be somewhere from your childhood, somewhere you vacationed or just a place in your imagination. Feel how it is to be in the place you're imagining. Pay attention to the sounds, air, colors, and who is there with you. This is a place where you feel cared for and loved. All is well. You are safe. Take in all the smells, sounds, tastes and feelings. As you do, feel your body being nourished and healed. Feel the calm that surrounds you. Breathe in all the good. Breathe out any discomfort or tension. 7. Sleep Enough If you get less than seven to eight hours of sleep, your body  won'[place] tolerate stress as well as it could. If stress keeps you up at night, address the cause, and add extra meditation into your day to make up for the lost z's. Try to get seven to nine hours of sleep each night. Make a regular bedtime schedule. Keep your room dark and cool. Try to avoid computers, TV, cell phones and tablets before bed. 8. [place] with Connections You Enjoy Go out for a coffee with a friend, chat with a neighbor, call a family member, visit with a clergy member, or even hang out with your pet. Clinical studies show that spending even a short time with a companion animal can cut anxiety levels almost in half. 9. Take a Vacation Getting away from it all can reset your stress tolerance by increasing your mental and emotional outlook, which makes you a happier, more productive person upon return. Leave your cellphone and laptop at home! 10. See a Counselor, Coach or Therapist If negative thoughts overwhelm your ability to make positive changes, it's time to seek professional help. Make an appointment today--your health and life are worth it."        Follow up:  Patient agrees to Care Plan and Follow-up.  Plan: The Managed Medicaid care management team will reach out to the patient again over the next 30 days.  Date/time of next scheduled Social Work care management/care coordination outreach:  09/17/22 at 3 pm.  Eula Fried, [place], MSW, [place] LCSW [email or web address] Phone: [phone]

## 2022-08-31 DIAGNOSIS — I509 Heart failure, unspecified: Secondary | ICD-10-CM | POA: Diagnosis not present

## 2022-08-31 DIAGNOSIS — F4321 Adjustment disorder with depressed mood: Secondary | ICD-10-CM | POA: Diagnosis not present

## 2022-08-31 DIAGNOSIS — N184 Chronic kidney disease, stage 4 (severe): Secondary | ICD-10-CM | POA: Diagnosis not present

## 2022-08-31 DIAGNOSIS — M19012 Primary osteoarthritis, left shoulder: Secondary | ICD-10-CM | POA: Diagnosis not present

## 2022-08-31 DIAGNOSIS — E1122 Type 2 diabetes mellitus with diabetic chronic kidney disease: Secondary | ICD-10-CM | POA: Diagnosis not present

## 2022-08-31 DIAGNOSIS — N1832 Chronic kidney disease, stage 3b: Secondary | ICD-10-CM | POA: Diagnosis not present

## 2022-08-31 DIAGNOSIS — I10 Essential (primary) hypertension: Secondary | ICD-10-CM | POA: Diagnosis not present

## 2022-09-03 ENCOUNTER — Other Ambulatory Visit: Payer: Medicaid Other | Admitting: Obstetrics and Gynecology

## 2022-09-03 ENCOUNTER — Encounter: Payer: Self-pay | Admitting: Obstetrics and Gynecology

## 2022-09-03 NOTE — Patient Outreach (Signed)
Medicaid Managed Care   Nurse Care Manager Note  09/03/2022 Name:  Maria Hudson MRN:  [id number] DOB:  Jul 19, 1960  Maria Hudson is an 62 y.o. year old female who is a primary patient of Nolene Ebbs, MD.  The [facility] Managed Care Coordination team was consulted for assistance with:    Chronic healthcare management needs, neuropathy, HTN, retinopathy, CKD, DM, CAD, OSA, chronic pain, depression, CHF  Maria Hudson was given information about Medicaid Managed Care Coordination team services today. Maria Hudson Patient agreed to services and verbal consent obtained.  [name] for follow up visit in response to provider referral for case management and/or care coordination services.   Assessments/Interventions:  Review of past medical history, allergies, medications, health status, including review of consultants reports, laboratory and other test data, was performed as part of comprehensive evaluation and provision of chronic care management services.  SDOH (Social Determinants of Health) assessments and interventions performed: SDOH Interventions    Flowsheet Row Patient Outreach Telephone from 09/03/2022 in [place] Patient Outreach from 08/20/2022 in [place] Patient Outreach Telephone from 08/19/2022 in [place] Patient Outreach Telephone from 08/12/2022 in [place] Patient Outreach Telephone from 08/04/2022 in [place] Patient Outreach Telephone from 07/29/2022 in [place]  SDOH Interventions        Transportation Interventions -- -- -- -- Intervention Not Indicated --  Utilities Interventions Intervention Not Indicated -- -- -- -- --  Financial Strain Interventions -- Intervention Not Indicated -- -- Intervention Not Indicated --  Stress Interventions -- -- [employer], Provide Counseling Offered [employer], Provide Counseling -- [employer], Provide Counseling     Care Plan  Allergies  Allergen Reactions   Metformin And Related Nausea Only   Other     Clonidine patch causes skin irritation    Hydrocodone Itching    Patient able to tolerate when taken with Benadryl.   Oxycodone-Acetaminophen Itching    Patient able to tolerate when taken with Benadryl.    Medications Reviewed Today     Reviewed by Gayla Medicus, RN (Registered Nurse) on 09/03/22 at [phone]  Med List Status: <None>   Medication Order Taking? Sig Documenting Provider Last Dose Status Informant  Accu-Chek Softclix Lancets lancets [id number] No 3 (three) times daily. [provider] Taking Active Self  acetaminophen (TYLENOL) 500 MG tablet [id number] No Take 1,000 mg by mouth every 6 (six) hours as needed for moderate pain or headache. [provider] Taking Active Self  allopurinol (ZYLOPRIM) 100 MG tablet [id number] No Take 100 mg by mouth in the morning. [provider] Taking Active Self  amLODipine (NORVASC) 10 MG tablet [id number] No Take 10 mg by mouth daily. [provider] Taking Active   aspirin EC 81 MG tablet [id number] No Take 81 mg by mouth in the morning. Swallow whole. [provider] Taking Active Self           Med Note [name]   Mon Apr 14, 2021  9:22 PM)    atorvastatin (LIPITOR) 40 MG tablet [id number] No Take 1 tablet (40 mg total) by mouth every evening. Skeet Latch, MD Taking Active Self  buPROPion [facility]) 100 MG tablet [id number] No Take 100 mg by mouth 2 (two) times daily. [provider] Taking Active Self  carvedilol (COREG) 25 MG tablet [id number] No Take 1  tablet (25 mg total) by mouth 2 (two) times daily with a meal. [name], [name], MD Taking Active Self  cholecalciferol (VITAMIN D3) 25 MCG (1000 UNIT) tablet [id number] No  Take 2,000 Units by mouth daily. [provider] Taking Active Self  cloNIDine (CATAPRES) 0.3 MG tablet [id number] No Take 0.3 mg by mouth 3 (three) times daily. [provider] Taking Active Self           Med Note Stanford Scotland   Tue Mar 03, 2022  9:23 AM)    Continuous Blood Gluc Transmit (DEXCOM G6 TRANSMITTER) MISC [id number]  Use one every 3 month [provider]  Active   dapagliflozin propanediol (FARXIGA) 10 MG TABS tablet [id number] No Take 1 tablet (10 mg total) by mouth daily before breakfast. Rafael Bihari, FNP Taking Active   ferrous sulfate 325 (65 FE) MG tablet [id number] No Take 325 mg by mouth 2 (two) times daily. [provider] Taking Active Self  fluticasone (FLONASE) 50 MCG/ACT nasal spray [id number] No Place 1 spray into both nostrils daily as needed for allergies or rhinitis. [provider] Taking Active Self  hydrALAZINE (APRESOLINE) 100 MG tablet [id number] No TAKE 1 TABLET BY MOUTH 3 TIMES DAILY. Skeet Latch, MD Taking Active   insulin glargine (LANTUS) 100 unit/mL SOPN [id number] No Inject 5 Units into the skin at bedtime. [provider] Taking Active Self           Med Note Jodi Mourning, CATHERINE T   Thu Aug 20, 2022 10:26 AM) Taking 3-10 units QPM  isosorbide mononitrate (IMDUR) 120 MG 24 hr tablet [id number] No Take 120 mg by mouth daily. [provider] Taking Active   linaclotide Rolan Lipa) 145 MCG CAPS capsule [id number] No Take 1 capsule (145 mcg total) by mouth daily before breakfast. Zehr, Laban Emperor, PA-C Taking Active   loratadine (CLARITIN) 10 MG tablet [id number] No Take 10 mg by mouth in the morning. [provider] Taking Active Self  Multiple Vitamin (MULTIVITAMIN ADULT) TABS [id number] No Take 1 tablet by mouth in the morning. [provider] Taking Active Self  nitroGLYCERIN (NITROSTAT) 0.4 MG SL tablet [id number] No Place 1 tablet (0.4 mg total) under the tongue every 5  (five) minutes as needed for chest pain. [name], [name], MD Taking Active Self           Med Note Gilman Buttner, Juanita Laster   Tue Aug 19, 2021  9:35 AM)    Cira Servant FLEXPEN 100 UNIT/ML FlexPen [id number] No Inject 3 Units into the skin 3 (three) times daily with meals. [provider] Taking Active Self           Med Note Jodi Mourning, CATHERINE T   Thu Aug 20, 2022 10:26 AM) 3-8 units with sliding scale  Omega-3 Fatty Acids (FISH OIL) 1200 MG CAPS [id number] No Take 1,200 mg by mouth in the morning. [provider] Taking Active Self  Polyethylene Glycol 3350 (MIRALAX PO) [id number] No Take by mouth daily as needed. [provider] Taking Active   pregabalin (LYRICA) 25 MG capsule [id number] No Take 25 mg by mouth 2 (two) times daily. [provider] Taking Active Self  Semaglutide, 1 MG/DOSE, (OZEMPIC, 1 MG/DOSE,) 4 MG/3ML SOPN [id number] No Inject 1 mg into the skin once a week. Buford Dresser, MD Taking Active   sertraline (ZOLOFT) 50 MG tablet [id number] No Take 50 mg by mouth in the morning. [provider] Taking Active Self  spironolactone (ALDACTONE) 50 MG tablet  [id number] No Take 50 mg by mouth daily. [provider] Taking Active   torsemide (DEMADEX) 20 MG tablet [id number] No Take 1 tablet (20 mg total) by mouth daily as needed.  Patient taking differently: Take 20-40 mg by mouth daily as needed.   [place], Maricela Bo, FNP Taking Active   traZODone (DESYREL) 50 MG tablet [id number] No Take 50 mg by mouth at bedtime. [provider] Taking Active Self  TURMERIC CURCUMIN PO [id number] No Take 500 mg by mouth daily at 6 (six) AM. [provider] Taking Active            Patient Active Problem List   Diagnosis Date Noted   Chronic constipation 03/31/2022   Pain due to onychomycosis of toenails of both feet 12/05/2021   Pure hypercholesterolemia 05/27/2021   Chronic diastolic CHF (congestive heart failure) ([place])  04/29/2021   Hypertensive urgency 04/14/2021   Acute on chronic diastolic CHF (congestive heart failure) ([place]) 04/14/2021   Chest pain 03/05/2021   CAD (coronary artery disease) 03/05/2021   T2DM (type 2 diabetes mellitus) ([place]) 03/05/2021   CKD (chronic kidney disease) stage 4, GFR 15-29 ml/min ([place]) 03/05/2021   Class 3 obesity ([place]) 03/05/2021   CVA (cerebral vascular accident) ([place]) 03/05/2021   Acute on chronic diastolic heart failure ([place]) 03/05/2021   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   AKI (acute kidney injury) ([place]) 03/05/2021   Vitreous hemorrhage of left eye ([place]) 11/14/2020   Orthopnea 08/06/2020   Vision loss of right eye 08/06/2020   Excessive daytime sleepiness 01/11/2020   Iron deficiency anemia 12/14/2019   Vitreous hemorrhage, right ([place]) 12/04/2019   Diabetic neuropathy ([place]) 07/26/2019   Proliferative diabetic retinopathy of both 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 ([place]) 02/03/2019   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   Conductive hearing loss, bilateral 03/03/2018   Chronic bilateral low back pain without sciatica 08/21/2015   Chronic kidney disease (CKD) stage G3b/A3, moderately decreased glomerular filtration rate (GFR) between 30-44 mL/min/1.73 square meter and albuminuria creatinine ratio greater than 300 mg/g ([place]) 04/30/2015   Type 2 diabetes mellitus with diabetic polyneuropathy, with long-term current use of insulin ([place]) 02/15/2015   Advance directive on file 05/17/2014   Refusal of blood transfusions as patient is Jehovah's Witness 05/17/2014   OSA treated with BiPAP 02/23/2014   Coronary artery disease 07/07/2013   Severe obesity (BMI >= 40) ([place]) 05/18/2013   Neuropathy [id number]   Diastolic heart failure ([place]) 10/16/2012   Essential hypertension 10/16/2012   Conditions to be addressed/monitored per PCP order:  Chronic healthcare management needs, neuropathy, HTN, retinopathy, CKD, DM, CAD, OSA, chronic pain,  depression, CHF  Care Plan : RN Care Manager Plan Of Care  Updates made by Gayla Medicus, RN since 09/03/2022 12:00 AM     Problem: Knowledge Deficit and Care Coordination Needs Related to Management of HTN, HF, Type 2 DM   Priority: High     Long-Range Goal: Development of Plan of Care to Address Knowledge Deficits and Care Coordination Needs related to  management of CHF, CAD, HTN, HLD, DMII, CKD Stage 4, and Depression   Start Date: 05/06/2021  Expected End Date: 12/04/2022  Priority: High  Note:   Current Barriers:  Knowledge Deficits related to plan of care for management of CHF, CAD, HTN, HLD, DMII, CKD Stage 4, and Depression 09/03/22:  Patient now being [facility] at [facility] and going well.  BP remains elevated-medication adjusted.  Blood sugars 91-156.  Recent Nephrology  appt-improved.  Patient declines Nutrition referral.   Some left shoulder pain-arthritis-being followed by PCP   RNCM Clinical Goal(s):  Patient will demonstrate ongoing adherence to prescribed treatment plan for CHF, CAD, HTN, HLD, DMII, CKD Stage 4, and Depression as evidenced by patient report of improved health and quality of life and no unplanned ED or unplanned hospital admissions continue to work with RN Care Manager and/or Social Worker to address care management and care coordination needs related to CHF, CAD, HTN, HLD, DMII, CKD Stage 4, and Depression as evidenced by adherence to CM Team Scheduled appointments      Work with [facility] LCSW and psychiatrist to treat depression Patient to schedule  an appt with ENDO-scheduled  for May  Interventions: Inter-disciplinary care team collaboration (see longitudinal plan of care) Evaluation of current treatment plan related to  self management and patient's adherence to plan as established by provider Mailed summary of benefits for her [place] Medicaid plan to her home address Collaborated with Care Guide for dental resources-completed. Care Guide  referral for dental resources Collaborated with BSW for assistance with [facility] appt.-completed. BSW referral for appt assistance. Collaborated with Pharmacy regarding  uncontrolled HTN Pharmacy referral for uncontrolled HTN-completed. Collaborated with LCSW for depression LCSW referral for depression-completed   Heart Failure Interventions:  (Status: Goal on Track (progressing): YES.)  Long Term Goal - patient states her fluid retention has significantly improved,  says she cannot tolerate the compression stockings because they are too tight and make her legs hurt Assessed patient's heart failure management strategies  Diabetes:  (Status: Goal on Track (progressing): YES.) Long Term Goal   Lab Results  Component Value Date   HGBA1C 6.7 (H) 04/29/2021   Hgn A1C=6.3 on 12/26/21 Hgb A1C=5.2 in October per patient Assessed patient's understanding of A1C goal: <7% Reviewed medications with patient and discussed importance of medication adherence;        Counseled on importance of regular laboratory monitoring as prescribed;        Discussed plans with patient for ongoing care management follow up and provided patient with direct contact information for care management team;      Advised patient, providing education and rationale, to check cbg three times daily and record        call provider for findings outside established parameters;       Review of patient status, including review of consultants reports, relevant laboratory and other test results, and medications completed;       Discussed addition of Ozempic to medication regimen , reviewed mechanism of action and proven cardiovascular benefits To schedule ENDO appt-discussed nutrition referral, [facility], [facility]-ENDO appt scheduled-patient declines referral.  Hypertension and CKD stage 4 : (Status: Goal on Track (progressing): YES.) Long Term Goal-  has been placed on kidney transplant waiting list, fistu;a placed  12/09/21,ready for use 03/11/22. Last practice recorded BP readings:  BP 04/01/22-145/87 BP 05/01/22-141/70 BP 08/04/22-155/70 BP readings today per patient, 09/03/22-180/70-85 BP Readings from Last 3 Encounters:  09/30/21 120/70  09/12/21 (!) 107/58  09/08/21 (!) 144/71     Most recent eGFR/CrCl:    No components found for: CRCL Lab Results  Component Value Date   NA 136 08/19/2021   K 3.9 08/19/2021   CREATININE 2.51 (H) 08/19/2021   EGFR 19 (L) 03/13/2021   GFRNONAA 22 (L) 08/19/2021   GLUCOSE 122 (H) 08/19/2021    Evaluation of current treatment plan related to hypertension self management and patient's adherence to plan  as established by provider;   Reviewed medications with patient and discussed importance of compliance;  Discussed plans with patient for ongoing care management follow up and provided patient with direct contact information for care management team; Advised patient, providing education and rationale, to monitor blood pressure daily and record, calling PCP for findings outside established parameters;  Reviewed scheduled/upcoming provider appointments  Patient with labile blood pressures, medication adjusted, patient to  f/u with nephrologist-appt in March, also PCP-completed-continues to follow with providers  Weight Loss Interventions:  (Status:  New goal.) Long Term Goal--patient has started Ozempic Discussed indications, mechanism of action, common side effects and CV benefits  of GLP1 Ozempic Assessed tolerance of Ozempic 08/04/22-patient states she has gained 7 pounds this month because she is not eating well-discussed nutrition referral, exercise, and [facility]-patient states she will check on these. 09/03/22: referrals declined  Interdisciplinary Collaboration:  (Status: Goal on Track (progressing): YES.) Long Term Goal  Collaborated with BSW to initiate plan of care to address needs related to Housing barriers, Inability to perform ADL's  independently, and Inability to perform IADL's independently in patient with CHF, CAD, HTN, HLD, DM, CKD Stage 4, and Depression Assessed status of patient's involvement with behavioral health counselor Collaboration with pharmacist for medication review, discussion of ? Medication side effects causing chronic constipation and arranging for patient to received home delivery of medications in blister packs  Patient Goals/Self-Care Activities: Take medications as prescribed   Attend all scheduled provider appointments Call pharmacy for medication refills 3-7 days in advance of running out of medications Attend church or other social activities Perform all self care activities independently  Call provider office for new concerns or questions  Work with the social worker to address care coordination needs and will continue to work with the clinical team to address health care and disease management related needs call office if I gain more than 2 pounds in one day or 5 pounds in one week keep legs up while sitting track weight in diary use salt in moderation watch for swelling in feet, ankles and legs every day weigh myself daily know when to call the doctorfor fluid weight gain or consistently abnormal BP readings at home track symptoms and what helps feel better or worse check blood sugar at prescribed times: three times daily enter blood sugar readings and medication or insulin into daily log take the blood sugar log to all doctor visits take the blood sugar meter to all doctor visits eat fish at least once per week fill half of plate with vegetables manage portion size check blood pressure daily write blood pressure results in a log or diary learn about high blood pressure take blood pressure log to all doctor appointments call doctor for signs and symptoms of high blood pressure keep all doctor appointments take medications for blood pressure exactly as prescribed Limit fluid intake  to 1 liter per day Collaborate with managed Medicaid team of BSW and pharmacist to address identified needs Work with cardiology pharmacist to assist with BP stabilization Work with [facility] LCSW and psychiatrist to treat depression Review summary of [employer] benefits mailed to home address and call to enroll in program that will benefit your chronic health issues Continue to use [facility] calendar to record weight , BP and CBGs so readings are all in one place Schedule dental appt Schedule ENDO appt-completed.   Long-Range Goal: Establish Plan of Care for Chronic Disease Management Needs   Priority: High  Note:   Timeframe:  Long-Range Goal Priority:  High Start Date:     11/12/21                        Expected End Date:  ongoing                     Follow Up Date: 10/06/22   - schedule appointment for vaccines needed due to my age or health - schedule recommended health tests (blood work, mammogram, colonoscopy, pap test) - schedule and keep appointment for annual check-up    Why is this important?   Screening tests can find diseases early when they are easier to treat.  Your doctor or nurse will talk with you about which tests are important for you.  Getting shots for common diseases like the flu and shingles will help prevent them.  09/03/22:  Patient with upcoming ENDO and Ophthalmology appts   Follow Up:  Patient agrees to Care Plan and Follow-up.  Plan: The Managed Medicaid care management team will reach out to the patient again over the next 30 business  days. and The  Patient has been provided with contact information for the Managed Medicaid care management team and has been advised to call with any health related questions or concerns.  Date/time of next scheduled RN care management/care coordination outreach:  10/06/22 at 0900.

## 2022-09-03 NOTE — Patient Instructions (Signed)
Hi Ms. Stidam, always a pleasure to speak with you-have a wonderful day!!  Ms. Hey was given information about Medicaid Managed Care team care coordination services as a part of their [facility] Medicaid benefit. Izabellah Stetzel verbally consented to engagement with the [facility] Managed Care team.   If you are experiencing a medical emergency, please call 911 or report to your local emergency department or urgent care.   If you have a non-emergency medical problem during routine business hours, please contact your provider's office and ask to speak with a nurse.   For questions related to your [facility] health plan, please call: [phone] or go here:[email or web address]  If you would like to schedule transportation through your [facility] plan, please call the following number at least 2 days in advance of your appointment: [phone].  You can also use the MTM portal or MTM mobile app to manage your rides. For the portal, please go to [email or web address].  Call the [place] at [phone], at any time, 24 hours a day, 7 days a week. If you are in danger or need immediate medical attention call 911.  If you would like help to quit smoking, call 1-800-QUIT-NOW 509-136-2362) OR Espaol: 1-855-Djelo-Ya HD:1601594) o para ms informacin haga clic aqu or Text READY to 200-400 to register via text  Ms. Nogueira - following are the goals we discussed in your visit today:   Goals Addressed    Timeframe:  Long-Range Goal Priority:  High Start Date:     11/12/21                        Expected End Date:  ongoing                     Follow Up Date: 10/06/22   - schedule appointment for vaccines needed due to my age or health - schedule recommended health tests (blood work, mammogram, colonoscopy, pap test) - schedule and keep appointment for annual check-up    Why is this important?   Screening tests can find diseases early when they are easier to  treat.  Your doctor or nurse will talk with you about which tests are important for you.  Getting shots for common diseases like the flu and shingles will help prevent them.  09/03/22:  Patient with upcoming ENDO and Ophthalmology appts  Patient verbalizes understanding of instructions and care plan provided today and agrees to view in [place]. Active MyChart status and patient understanding of how to access instructions and care plan via MyChart confirmed with patient.     The Managed Medicaid care management team will reach out to the patient again over the next 30 business  days.  The  Patient   has been provided with contact information for the Managed Medicaid care management team and has been advised to call with any health related questions or concerns.   Aida Raider RN, BSN [place] Management Coordinator - Managed Medicaid High Risk [phone]   Following is a copy of your plan of care:  Care Plan : [place]  Updates made by Gayla Medicus, RN since 09/03/2022 12:00 AM     Problem: Knowledge Deficit and Care Coordination Needs Related to Management of HTN, HF, Type 2 DM   Priority: High     Long-Range Goal: Development of Plan of Care to Address Knowledge Deficits and Care Coordination Needs related to  management of CHF, CAD, HTN, HLD, DMII, CKD Stage 4, and Depression   Start Date: 05/06/2021  Expected End Date: 12/04/2022  Priority: High  Note:   Current Barriers:  Knowledge Deficits related to plan of care for management of CHF, CAD, HTN, HLD, DMII, CKD Stage 4, and Depression 09/03/22:  Patient [facility] and going well.  BP remains elevated-medication adjusted.  Blood sugars 91-156.  Recent Nephrology appt-improved.  Patient declines Nutrition referral.   Some left shoulder pain-arthritis-being followed by PCP   RNCM Clinical Goal(s):  Patient will demonstrate ongoing adherence to prescribed  treatment plan for CHF, CAD, HTN, HLD, DMII, CKD Stage 4, and Depression as evidenced by patient report of improved health and quality of life and no unplanned ED or unplanned hospital admissions continue to work with RN Care Manager and/or Social Worker to address care management and care coordination needs related to CHF, CAD, HTN, HLD, DMII, CKD Stage 4, and Depression as evidenced by adherence to CM Team Scheduled appointments      Work with [facility] LCSW and psychiatrist to treat depression Patient to schedule  an appt with ENDO-scheduled  for May  Interventions: Inter-disciplinary care team collaboration (see longitudinal plan of care) Evaluation of current treatment plan related to  self management and patient's adherence to plan as established by provider Mailed summary of benefits for her [place] Medicaid plan to her home address Collaborated with Care Guide for dental resources-completed. Care Guide referral for dental resources Collaborated with BSW for assistance with [facility] appt.-completed. BSW referral for appt assistance. Collaborated with Pharmacy regarding  uncontrolled HTN Pharmacy referral for uncontrolled HTN-completed. Collaborated with LCSW for depression LCSW referral for depression-completed   Heart Failure Interventions:  (Status: Goal on Track (progressing): YES.)  Long Term Goal - patient states her fluid retention has significantly improved,  says she cannot tolerate the compression stockings because they are too tight and make her legs hurt Assessed patient's heart failure management strategies  Diabetes:  (Status: Goal on Track (progressing): YES.) Long Term Goal   Lab Results  Component Value Date   HGBA1C 6.7 (H) 04/29/2021   Hgn A1C=6.3 on 12/26/21 Hgb A1C=5.2 in October per patient Assessed patient's understanding of A1C goal: <7% Reviewed medications with patient and discussed importance of medication adherence;        Counseled on  importance of regular laboratory monitoring as prescribed;        Discussed plans with patient for ongoing care management follow up and provided patient with direct contact information for care management team;      Advised patient, providing education and rationale, to check cbg three times daily and record        call provider for findings outside established parameters;       Review of patient status, including review of consultants reports, relevant laboratory and other test results, and medications completed;       Discussed addition of Ozempic to medication regimen , reviewed mechanism of action and proven cardiovascular benefits To schedule ENDO appt-discussed nutrition referral, [facility], [facility]-ENDO appt scheduled-patient declines referral.  Hypertension and CKD stage 4 : (Status: Goal on Track (progressing): YES.) Long Term Goal-  has been placed on kidney transplant waiting list, fistu;a placed 12/09/21,ready for use 03/11/22. Last practice recorded BP readings:  BP 04/01/22-145/87 BP 05/01/22-141/70 BP 08/04/22-155/70 BP readings today per patient, 09/03/22-180/70-85 BP Readings from Last 3 Encounters:  09/30/21 120/70  09/12/21 (!) 107/58  09/08/21 (!) 144/71     Most recent eGFR/CrCl:    No components found for: CRCL Lab Results  Component Value Date   NA 136 08/19/2021   K 3.9 08/19/2021   CREATININE 2.51 (H) 08/19/2021   EGFR 19 (L) 03/13/2021   GFRNONAA 22 (L) 08/19/2021   GLUCOSE 122 (H) 08/19/2021    Evaluation of current treatment plan related to hypertension self management and patient's adherence to plan as established by provider;   Reviewed medications with patient and discussed importance of compliance;  Discussed plans with patient for ongoing care management follow up and provided patient with direct contact information for care management team; Advised patient, providing education and rationale, to monitor blood pressure daily and record,  calling PCP for findings outside established parameters;  Reviewed scheduled/upcoming provider appointments  Patient with labile blood pressures, medication adjusted, patient to  f/u with nephrologist-appt in March, also PCP-completed-continues to follow with providers  Weight Loss Interventions:  (Status:  New goal.) Long Term Goal--patient has started Ozempic Discussed indications, mechanism of action, common side effects and CV benefits  of GLP1 Ozempic Assessed tolerance of Ozempic 08/04/22-patient states she has gained 7 pounds this month because she is not eating well-discussed nutrition referral, exercise, and [facility]-patient states she will check on these. 09/03/22: referrals declined  Interdisciplinary Collaboration:  (Status: Goal on Track (progressing): YES.) Long Term Goal  Collaborated with BSW to initiate plan of care to address needs related to Housing barriers, Inability to perform ADL's independently, and Inability to perform IADL's independently in patient with CHF, CAD, HTN, HLD, DM, CKD Stage 4, and Depression Assessed status of patient's involvement with behavioral health counselor Collaboration with pharmacist for medication review, discussion of ? Medication side effects causing chronic constipation and arranging for patient to received home delivery of medications in blister packs  Patient Goals/Self-Care Activities: Take medications as prescribed   Attend all scheduled provider appointments Call pharmacy for medication refills 3-7 days in advance of running out of medications Attend church or other social activities Perform all self care activities independently  Call provider office for new concerns or questions  Work with the social worker to address care coordination needs and will continue to work with the clinical team to address health care and disease management related needs call office if I gain more than 2 pounds in one day or 5 pounds in  one week keep legs up while sitting track weight in diary use salt in moderation watch for swelling in feet, ankles and legs every day weigh myself daily know when to call the doctorfor fluid weight gain or consistently abnormal BP readings at home track symptoms and what helps feel better or worse check blood sugar at prescribed times: three times daily enter blood sugar readings and medication or insulin into daily log take the blood sugar log to all doctor visits take the blood sugar meter to all doctor visits eat fish at least once per week fill half of plate with vegetables manage portion size check blood pressure daily write blood pressure results in a log or diary learn about high blood pressure take blood pressure log to all doctor appointments call doctor for signs and symptoms of high blood pressure keep all doctor appointments take medications for blood pressure exactly as prescribed Limit fluid intake to 1 liter per day Collaborate with managed Medicaid team of BSW and pharmacist to address identified needs Work with cardiology pharmacist to assist with BP stabilization Work with  [place] LCSW and psychiatrist to treat depression Review summary of Wellcare benefits mailed to home address and call to enroll in program that will benefit your chronic health issues Continue to use [place] [facility] calendar to record weight , BP and CBGs so readings are all in one place Schedule dental appt Schedule ENDO appt-completed.

## 2022-09-07 DIAGNOSIS — F4321 Adjustment disorder with depressed mood: Secondary | ICD-10-CM | POA: Diagnosis not present

## 2022-09-14 DIAGNOSIS — Z419 Encounter for procedure for purposes other than remedying health state, unspecified: Secondary | ICD-10-CM | POA: Diagnosis not present

## 2022-09-14 DIAGNOSIS — F4321 Adjustment disorder with depressed mood: Secondary | ICD-10-CM | POA: Diagnosis not present

## 2022-09-14 DIAGNOSIS — G4733 Obstructive sleep apnea (adult) (pediatric): Secondary | ICD-10-CM | POA: Diagnosis not present

## 2022-09-17 ENCOUNTER — Ambulatory Visit: Payer: Medicaid Other | Admitting: Licensed Clinical Social Worker

## 2022-09-21 DIAGNOSIS — F4321 Adjustment disorder with depressed mood: Secondary | ICD-10-CM | POA: Diagnosis not present

## 2022-09-22 ENCOUNTER — Other Ambulatory Visit: Payer: Medicaid Other | Admitting: Licensed Clinical Social Worker

## 2022-09-22 NOTE — Patient Instructions (Signed)
Visit Information  Maria Hudson was given information about Medicaid Managed Care team care coordination services as a part of their [facility]. Earney HamburgJeannette Kassner verbally consented to engagement with the [facility] team.   If you are experiencing a medical emergency, please call 911 or report to your local emergency department or urgent care.   If you have a non-emergency medical problem during routine business hours, please contact your provider's office and ask to speak with a nurse.   For questions related to your [facility] health plan, please call: [phone] or go here:[email or web address]  If you would like to schedule transportation through your [facility] Medicaid plan, please call the following number at least 2 days in advance of your appointment: [phone].  You can also use the MTM portal or MTM mobile app to manage your rides. For the portal, please go to [email or web address].  Call the [facility] at [phone], at any time, 24 hours a day, 7 days a week. If you are in danger or need immediate medical attention call 911.  If you would like help to quit smoking, call 1-800-QUIT-NOW (959-764-86201-(830)437-5718) OR Espaol: 1-855-Djelo-Ya (7-253-664-4034(1-701-601-1933) o para ms informacin haga clic aqu or Text READY to 742-595200-400 to register via text   Following is a copy of your plan of care:  Care Plan : General Social Work (Adult)  Updates made by Gustavus BryantJoyce, Lorra Freeman L, LCSW since 09/22/2022 12:00 AM     Problem: Anxiety Identification (Anxiety)      Long-Range Goal: Anxiety Symptoms Identified   Start Date: 05/16/2021  Priority: High  Note:    Timeframe:  Long-Range Goal Priority:  High Start Date:    07/09/22                 Expected End Date:   ongoing                     Follow Up Date- 11/02/22 at 10 45 am  Current barriers:   Chronic Mental Health needs related to depression Mental Health Concerns  and Social Isolation Needs Support,  Education, and Care Coordination in order to meet unmet mental health needs. Clinical Goal(s): demonstrate a reduction in symptoms related to :Depression: loss of energy/fatigue feelings of worthlessness, guilt  verbalize understanding of plan for management of Depression    Patient Goals/Self-Care Activities: Over the next 120 days REFERRAL PLACED I [facility] and [facility] will contact you.  If you have not heard from them in [facility] and  please follow up.  The following coping skill education was provided for stress relief and mental health management: "When your car dies or a deadline looms, how do you respond? Long-term, low-grade or acute stress takes a serious toll on your body and mind, so don't ignore feelings of constant tension. Stress is a natural part of life. However, too much stress can harm our health, especially if it continues every day. This is chronic stress and can put you at risk for heart problems like heart disease and depression. Understand what's happening inside your body and learn simple coping skills to combat the negative impacts of everyday stressors.  Types of Stress There are two types of stress: Emotional - types of emotional stress are relationship problems, pressure at work, financial worries, experiencing discrimination or having a major life change. Physical - Examples of physical stress include being sick having pain, not sleeping well, recovery  from an injury or having an alcohol and drug use disorder. Fight or Flight Sudden or ongoing stress activates your nervous system and floods your bloodstream with adrenaline and cortisol, two hormones that raise blood pressure, increase heart rate and spike blood sugar. These changes pitch your body into a fight or flight response. That enabled our ancestors to outrun saber-toothed tigers, and it's helpful today for situations like dodging a car  accident. But most modern chronic stressors, such as finances or a challenging relationship, keep your body in that heightened state, which hurts your health. Effects of Too Much Stress If constantly under stress, most of [place] will eventually start to function less well.  Multiple studies link chronic stress to a higher risk of heart disease, stroke, depression, weight gain, memory loss and even premature death, so it's important to recognize the warning signals. Talk to your doctor about ways to manage stress if you're experiencing any of these symptoms: Prolonged periods of poor sleep. Regular, severe headaches. Unexplained weight loss or gain. Feelings of isolation, withdrawal or worthlessness. Constant anger and irritability. Loss of interest in activities. Constant worrying or obsessive thinking. Excessive alcohol or drug use. Inability to concentrate.  10 Ways to Cope with Chronic Stress It's key to recognize stressful situations as they occur because it allows you to focus on managing how you react. We all need to know when to close our eyes and take a deep breath when we feel tension rising. Use these tips to prevent or reduce chronic stress. 1. Rebalance Work and Home All work and no play? If you're spending too much time at the office, intentionally put more dates in your calendar to enjoy time for fun, either alone or with others. 2. Get Regular Exercise Moving your body on a regular basis balances the nervous system and increases blood circulation, helping to flush out stress hormones. Even a daily 20-minute walk makes a difference. Any kind of exercise can lower stress and improve your mood ? just pick activities that you enjoy and make it a regular habit. 3. Eat Well and Limit Alcohol and Stimulants Alcohol, nicotine and caffeine may temporarily relieve stress but have negative health impacts and can make stress worse in the long run. Well-nourished bodies cope better, so start with  a good breakfast, add more organic fruits and vegetables for a well-balanced diet, avoid processed foods and sugar, try herbal tea and drink more water. 4. Connect with Supportive People Talking face to face with another person releases hormones that reduce stress. Lean on those good listeners in your life. 5. Carve Out Hobby Time Do you enjoy gardening, reading, listening to music or some other creative pursuit? Engage in activities that bring you pleasure and joy; research shows that reduces stress by almost half and lowers your heart rate, too. 6. Practice Meditation, Stress Reduction or Yoga Relaxation techniques activate a state of restfulness that counterbalances your body's fight-or-flight hormones. Even if this also means a 10-minute break in a long day: listen to music, read, go for a walk in nature, do a hobby, take a bath or spend time with a friend. Also consider doing a mindfulness exercise or try a daily deep breathing or imagery practice. Deep Breathing Slow, calm and deep breathing can help you relax. Try these steps to focus on your breathing and repeat as needed. Find a comfortable position and close your eyes. Exhale and drop your shoulders. Breathe in through your nose; fill your lungs and then your belly.  Think of relaxing your body, quieting your mind and becoming calm and peaceful. Breathe out slowly through your nose, relaxing your belly. Think of releasing tension, pain, worries or distress. Repeat steps three and four until you feel relaxed. Imagery This involves using your mind to excite the senses -- sound, vision, smell, taste and feeling. This may help ease your stress. Begin by getting comfortable and then do some slow breathing. Imagine a place you love being at. It could be somewhere from your childhood, somewhere you vacationed or just a place in your imagination. Feel how it is to be in the place you're imagining. Pay attention to the sounds, air, colors, and who  is there with you. This is a place where you feel cared for and loved. All is well. You are safe. Take in all the smells, sounds, tastes and feelings. As you do, feel your body being nourished and healed. Feel the calm that surrounds you. Breathe in all the good. Breathe out any discomfort or tension. 7. Sleep Enough If you get less than seven to eight hours of sleep, your body won't tolerate stress as well as it could. If stress keeps you up at night, address the cause, and add extra meditation into your day to make up for the lost z's. Try to get seven to nine hours of sleep each night. Make a regular bedtime schedule. Keep your room dark and cool. Try to avoid computers, TV, cell phones and tablets before bed. 8. [place] with Connections You Enjoy Go out for a coffee with a friend, chat with a neighbor, call a family member, visit with a clergy member, or even hang out with your pet. Clinical studies show that spending even a short time with a companion animal can cut anxiety levels almost in half. 9. Take a Vacation Getting away from it all can reset your stress tolerance by increasing your mental and emotional outlook, which makes you a happier, more productive person upon return. Leave your cellphone and laptop at home! 10. See a Counselor, Coach or Therapist If negative thoughts overwhelm your ability to make positive changes, it's time to seek professional help. Make an appointment today--your health and life are worth it."

## 2022-09-22 NOTE — Patient Outreach (Signed)
Medicaid Managed Care Social Work Note  09/22/2022 Name:  Maria Hudson MRN:  [id number] DOB:  03-08-1961  Maria Hudson is an 62 y.o. year old female who is a primary patient of Maria Contras, MD.  The Medicaid Managed Care Coordination team was consulted for assistance with:  Mental Health Counseling and Resources  Maria Hudson was given information about Medicaid Managed Care Coordination team services today. Maria Hudson Patient agreed to services and verbal consent obtained.  Engaged with patient  for by telephone forfollow up visit in response to referral for case management and/or care coordination services.   Assessments/Interventions:  Review of past medical history, allergies, medications, health status, including review of consultants reports, laboratory and other test data, was performed as part of comprehensive evaluation and provision of chronic care management services.  SDOH: (Social Determinant of Health) assessments and interventions performed: SDOH Interventions    Flowsheet Row Patient Outreach Telephone from 09/22/2022 in [place] POPULATION HEALTH DEPARTMENT Patient Outreach Telephone from 09/03/2022 in [place] POPULATION HEALTH DEPARTMENT Patient Outreach from 08/20/2022 in [place] POPULATION HEALTH DEPARTMENT Patient Outreach Telephone from 08/19/2022 in [place] POPULATION HEALTH DEPARTMENT Patient Outreach Telephone from 08/12/2022 in [place] POPULATION HEALTH DEPARTMENT Patient Outreach Telephone from 08/04/2022 in  POPULATION HEALTH DEPARTMENT  SDOH Interventions        Transportation Interventions -- -- -- -- -- Intervention Not Indicated  Utilities Interventions -- Intervention Not Indicated -- -- -- --  Financial Strain Interventions -- -- Intervention Not Indicated -- -- Intervention Not Indicated  Stress Interventions Offered [employer], Provide Counseling -- -- Offered [employer], Provide Counseling Offered  [employer], Provide Counseling --       Advanced Directives Status:  See Care Plan for related entries.  Care Plan                 Allergies  Allergen Reactions   Metformin And Related Nausea Only   Other     Clonidine patch causes skin irritation    Hydrocodone Itching    Patient able to tolerate when taken with Benadryl.   Oxycodone-Acetaminophen Itching    Patient able to tolerate when taken with Benadryl.    Medications Reviewed Today     Reviewed by Maria Chandler, RN (Registered Nurse) on 09/03/22 at [phone]  Med List Status: <None>   Medication Order Taking? Sig Documenting Provider Last Dose Status Informant  Accu-Chek Softclix Lancets lancets [id number] No 3 (three) times daily. [provider] Taking Active Self  acetaminophen (TYLENOL) 500 MG tablet [id number] No Take 1,000 mg by mouth every 6 (six) hours as needed for moderate pain or headache. [provider] Taking Active Self  allopurinol (ZYLOPRIM) 100 MG tablet [id number] No Take 100 mg by mouth in the morning. [provider] Taking Active Self  amLODipine (NORVASC) 10 MG tablet [id number] No Take 10 mg by mouth daily. [provider] Taking Active   aspirin EC 81 MG tablet [id number] No Take 81 mg by mouth in the morning. Swallow whole. [provider] Taking Active Self           Med Note [name]   Mon Apr 14, 2021  9:22 PM)    atorvastatin (LIPITOR) 40 MG tablet [id number] No Take 1 tablet (40 mg total) by mouth every evening. Chilton Si, MD Taking Active Self  buPROPion [facility]) 100 MG tablet [id number] No Take 100 mg by mouth 2 (two) times daily. [provider]  Taking Active Self  carvedilol (COREG) 25 MG tablet [id number] No Take 1 tablet (25 mg total) by mouth 2 (two) times daily with a meal. [name], York Ram, MD Taking Active Self  cholecalciferol (VITAMIN D3) 25 MCG (1000 UNIT) tablet [id number] No Take 2,000 Units by  mouth daily. [provider] Taking Active Self  cloNIDine (CATAPRES) 0.3 MG tablet [id number] No Take 0.3 mg by mouth 3 (three) times daily. [provider] Taking Active Self           Med Note Suezanne Cheshire   Tue Mar 03, 2022  9:23 AM)    Continuous Blood Gluc Transmit (DEXCOM G6 TRANSMITTER) MISC [id number]  Use one every 3 month [provider]  Active   dapagliflozin propanediol (FARXIGA) 10 MG TABS tablet [id number] No Take 1 tablet (10 mg total) by mouth daily before breakfast. Jacklynn Ganong, FNP Taking Active   ferrous sulfate 325 (65 FE) MG tablet [id number] No Take 325 mg by mouth 2 (two) times daily. [provider] Taking Active Self  fluticasone (FLONASE) 50 MCG/ACT nasal spray [id number] No Place 1 spray into both nostrils daily as needed for allergies or rhinitis. [provider] Taking Active Self  hydrALAZINE (APRESOLINE) 100 MG tablet [id number] No TAKE 1 TABLET BY MOUTH 3 TIMES DAILY. Chilton Si, MD Taking Active   insulin glargine (LANTUS) 100 unit/mL SOPN [id number] No Inject 5 Units into the skin at bedtime. [provider] Taking Active Self           Med Note Clearance Coots, CATHERINE T   Thu Aug 20, 2022 10:26 AM) Taking 3-10 units QPM  isosorbide mononitrate (IMDUR) 120 MG 24 hr tablet [id number] No Take 120 mg by mouth daily. [provider] Taking Active   linaclotide Karlene Einstein) 145 MCG CAPS capsule [id number] No Take 1 capsule (145 mcg total) by mouth daily before breakfast. Zehr, Princella Pellegrini, PA-C Taking Active   loratadine (CLARITIN) 10 MG tablet [id number] No Take 10 mg by mouth in the morning. [provider] Taking Active Self  Multiple Vitamin (MULTIVITAMIN ADULT) TABS [id number] No Take 1 tablet by mouth in the morning. [provider] Taking Active Self  nitroGLYCERIN (NITROSTAT) 0.4 MG SL tablet [id number] No Place 1 tablet (0.4 mg total) under the tongue every 5 (five) minutes as needed  for chest pain. [name], York Ram, MD Taking Active Self           Med Note Kittie Plater, Smitty Cords   Tue Aug 19, 2021  9:35 AM)    Rubbie Battiest FLEXPEN 100 UNIT/ML FlexPen [id number] No Inject 3 Units into the skin 3 (three) times daily with meals. [provider] Taking Active Self           Med Note Clearance Coots, CATHERINE T   Thu Aug 20, 2022 10:26 AM) 3-8 units with sliding scale  Omega-3 Fatty Acids (FISH OIL) 1200 MG CAPS [id number] No Take 1,200 mg by mouth in the morning. [provider] Taking Active Self  Polyethylene Glycol 3350 (MIRALAX PO) [id number] No Take by mouth daily as needed. [provider] Taking Active   pregabalin (LYRICA) 25 MG capsule [id number] No Take 25 mg by mouth 2 (two) times daily. [provider] Taking Active Self  Semaglutide, 1 MG/DOSE, (OZEMPIC, 1 MG/DOSE,) 4 MG/3ML SOPN [id number] No Inject 1 mg into the skin once a week. Jodelle Red, MD Taking Active   sertraline (ZOLOFT) 50 MG tablet [id number] No Take 50 mg by mouth in the  morning. [provider] Taking Active Self  spironolactone (ALDACTONE) 50 MG tablet [id number] No Take 50 mg by mouth daily. [provider] Taking Active   torsemide (DEMADEX) 20 MG tablet [id number] No Take 1 tablet (20 mg total) by mouth daily as needed.  Patient taking differently: Take 20-40 mg by mouth daily as needed.   Crewe, Anderson Malta, FNP Taking Active   traZODone (DESYREL) 50 MG tablet [id number] No Take 50 mg by mouth at bedtime. [provider] Taking Active Self  TURMERIC CURCUMIN PO [id number] No Take 500 mg by mouth daily at 6 (six) AM. [provider] Taking Active             Patient Active Problem List   Diagnosis Date Noted   Chronic constipation 03/31/2022   Pain due to onychomycosis of toenails of both feet 12/05/2021   Pure hypercholesterolemia 05/27/2021   Chronic diastolic CHF (congestive heart failure) 04/29/2021   Hypertensive  urgency 04/14/2021   Acute on chronic diastolic CHF (congestive heart failure) 04/14/2021   Chest pain 03/05/2021   CAD (coronary artery disease) 03/05/2021   T2DM (type 2 diabetes mellitus) 03/05/2021   CKD (chronic kidney disease) stage 4, GFR 15-29 ml/min 03/05/2021   Class 3 obesity 03/05/2021   CVA (cerebral vascular accident) 03/05/2021   Acute on chronic diastolic heart failure 03/05/2021   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   AKI (acute kidney injury) 03/05/2021   Vitreous hemorrhage of left eye 11/14/2020   Orthopnea 08/06/2020   Vision loss of right eye 08/06/2020   Excessive daytime sleepiness 01/11/2020   Iron deficiency anemia 12/14/2019   Vitreous hemorrhage, right 12/04/2019   Diabetic neuropathy 07/26/2019   Proliferative diabetic retinopathy of both 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 02/03/2019   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   Conductive hearing loss, bilateral 03/03/2018   Chronic bilateral low back pain without sciatica 08/21/2015   Chronic kidney disease (CKD) stage G3b/A3, moderately decreased glomerular filtration rate (GFR) between 30-44 mL/min/1.73 square meter and albuminuria creatinine ratio greater than 300 mg/g 04/30/2015   Type 2 diabetes mellitus with diabetic polyneuropathy, with long-term current use of insulin 02/15/2015   Advance directive on file 05/17/2014   Refusal of blood transfusions as patient is Jehovah's Witness 05/17/2014   OSA treated with BiPAP 02/23/2014   Coronary artery disease 07/07/2013   Severe obesity (BMI >= 40) 05/18/2013   Neuropathy 04/11/2013   Diastolic heart failure 10/16/2012   Essential hypertension 10/16/2012    Conditions to be addressed/monitored per PCP order:  Anxiety  Care Plan : General Social Work (Adult)  Updates made by Gustavus Bryant, LCSW since 09/22/2022 12:00 AM     Problem: Anxiety Identification (Anxiety)      Long-Range Goal: Anxiety Symptoms Identified   Start  Date: 05/16/2021  Priority: High  Note:    Timeframe:  Long-Range Goal Priority:  High Start Date:    07/09/22                 Expected End Date:   ongoing                     Follow Up Date- 11/02/22 at 10 45 am  Current barriers:   Chronic Mental Health needs related to depression Mental Health Concerns  and Social Isolation Needs Support, Education, and Care Coordination in order to meet unmet mental health needs. Clinical Goal(s): demonstrate a reduction in symptoms related to :Depression: loss of  energy/fatigue feelings of worthlessness, guilt  verbalize understanding of plan for management of Depression   Clinical Interventions:  Assessed patient's previous and current treatment, coping skills, support system and barriers to care  Depression screen reviewed  Active listening / Reflection utilized  Behavioral Activation reviewed Provided brief CBT  Suicidal Ideation/Homicidal Ideation assessed: Discussed Guardianship and reviewed process  Discussed Health Care Power of Attorney  Discussed referral for psychiatry , patient does not wish to pursue a referral for psychiatry at this time Made referral to [facility] for counseling on 07/09/22 and email was sent to patient today as well with mental health resources Healthy self-care education was provided during 07/17/22 session due to patient being sick this past week [facility] LCSW 07/17/22 update- Patient reports that she has already received a call from [facility] regarding the therapy referral [facility] LCSW made. She said she missed this call due to being sick and will make sure to contact them back for scheduling soon. Patient was encouraged to drink a lot of fluids to help hydrate her body and she was encouraged to get some rest. 07/29/22 Update- Patient reports that she is playing phone tag with [facility] but they are both trying to reach each other and have not been able to do so but she will return their last call and will  notify [facility] LCSW if she continues to have issues. Patient reports that she is working actively on fighting her negative feelings and emotions in order to live her life more positively. LCSW 08/12/22 update- Patient reports that she has been unable to establish contact with [facility] so [facility] LCSW and patient completed joint call to agency and left a message. [facility] returned call to [facility] LCSW and stated that their therapist and has been trying to reach patient to get her scheduled but has been unable to reach her. Patient was called and provided this update and is agreeable to contacting therapist directly given the contact information left in her voice messages. Self-care education provided and an addition email was sent to her as well with resources/suggested self-care tips and tricks. [facility] LCSW 08/19/22 update- Patient reports that she has still been having issues establishing contact with [facility] so [facility] LCSW completed joint phone call with patient to agency and was able to successfully schedule her for in person bi weekly counseling sessions every other Monday at 5 pm. Patient's first appointment is next Monday on 08/24/22 at 5 pm. Email was sent to patient with appointment reminders and tips/tricks for self-care. Patient has not been successfully established with a long term therapist at [facility] and will have weekly therapy sessions on Mondays at 9 am. Patient reports having a successful first session with her new therapist. 09/22/22- Patient confirms attending regular weekly sessions with her therapist. She reports that she has been able to build great rapport with him. She declines any concerns. She reports that she is unsure if she needs a psychiatry referral at this time. Patient will dive deep into therapy over the next 30 days and then will consider if she needs to make any adjustments to her current psychiatric medications.   Review resources, discussed options and provided  patient information about  Options for mental health treatment based on need and insurance  Inter-disciplinary care team collaboration (see longitudinal plan of care) Patient Goals/Self-Care Activities: Over the next 120 days REFERRAL PLACED I have placed a referral for counseling at [facility] and they will contact you.  If  you have not heard from them in for counseling at [facility] and  please follow up.  The following coping skill education was provided for stress relief and mental health management: "When your car dies or a deadline looms, how do you respond? Long-term, low-grade or acute stress takes a serious toll on your body and mind, so don't ignore feelings of constant tension. Stress is a natural part of life. However, too much stress can harm our health, especially if it continues every day. This is chronic stress and can put you at risk for heart problems like heart disease and depression. Understand what's happening inside your body and learn simple coping skills to combat the negative impacts of everyday stressors.  Types of Stress There are two types of stress: Emotional - types of emotional stress are relationship problems, pressure at work, financial worries, experiencing discrimination or having a major life change. Physical - Examples of physical stress include being sick having pain, not sleeping well, recovery from an injury or having an alcohol and drug use disorder. Fight or Flight Sudden or ongoing stress activates your nervous system and floods your bloodstream with adrenaline and cortisol, two hormones that raise blood pressure, increase heart rate and spike blood sugar. These changes pitch your body into a fight or flight response. That enabled our ancestors to outrun saber-toothed tigers, and it's helpful today for situations like dodging a car accident. But most modern chronic stressors, such as finances or a challenging relationship, keep your body in that  heightened state, which hurts your health. Effects of Too Much Stress If constantly under stress, most of [place] will eventually start to function less well.  Multiple studies link chronic stress to a higher risk of heart disease, stroke, depression, weight gain, memory loss and even premature death, so it's important to recognize the warning signals. Talk to your doctor about ways to manage stress if you're experiencing any of these symptoms: Prolonged periods of poor sleep. Regular, severe headaches. Unexplained weight loss or gain. Feelings of isolation, withdrawal or worthlessness. Constant anger and irritability. Loss of interest in activities. Constant worrying or obsessive thinking. Excessive alcohol or drug use. Inability to concentrate.  10 Ways to Cope with Chronic Stress It's key to recognize stressful situations as they occur because it allows you to focus on managing how you react. We all need to know when to close our eyes and take a deep breath when we feel tension rising. Use these tips to prevent or reduce chronic stress. 1. Rebalance Work and Home All work and no play? If you're spending too much time at the office, intentionally put more dates in your calendar to enjoy time for fun, either alone or with others. 2. Get Regular Exercise Moving your body on a regular basis balances the nervous system and increases blood circulation, helping to flush out stress hormones. Even a daily 20-minute walk makes a difference. Any kind of exercise can lower stress and improve your mood ? just pick activities that you enjoy and make it a regular habit. 3. Eat Well and Limit Alcohol and Stimulants Alcohol, nicotine and caffeine may temporarily relieve stress but have negative health impacts and can make stress worse in the long run. Well-nourished bodies cope better, so start with a good breakfast, add more organic fruits and vegetables for a well-balanced diet, avoid processed foods and sugar,  try herbal tea and drink more water. 4. Connect with Supportive People Talking face to face with another person releases  hormones that reduce stress. Lean on those good listeners in your life. 5. Carve Out Hobby Time Do you enjoy gardening, reading, listening to music or some other creative pursuit? Engage in activities that bring you pleasure and joy; research shows that reduces stress by almost half and lowers your heart rate, too. 6. Practice Meditation, Stress Reduction or Yoga Relaxation techniques activate a state of restfulness that counterbalances your body's fight-or-flight hormones. Even if this also means a 10-minute break in a long day: listen to music, read, go for a walk in nature, do a hobby, take a bath or spend time with a friend. Also consider doing a mindfulness exercise or try a daily deep breathing or imagery practice. Deep Breathing Slow, calm and deep breathing can help you relax. Try these steps to focus on your breathing and repeat as needed. Find a comfortable position and close your eyes. Exhale and drop your shoulders. Breathe in through your nose; fill your lungs and then your belly. Think of relaxing your body, quieting your mind and becoming calm and peaceful. Breathe out slowly through your nose, relaxing your belly. Think of releasing tension, pain, worries or distress. Repeat steps three and four until you feel relaxed. Imagery This involves using your mind to excite the senses -- sound, vision, smell, taste and feeling. This may help ease your stress. Begin by getting comfortable and then do some slow breathing. Imagine a place you love being at. It could be somewhere from your childhood, somewhere you vacationed or just a place in your imagination. Feel how it is to be in the place you're imagining. Pay attention to the sounds, air, colors, and who is there with you. This is a place where you feel cared for and loved. All is well. You are safe. Take in all the  smells, sounds, tastes and feelings. As you do, feel your body being nourished and healed. Feel the calm that surrounds you. Breathe in all the good. Breathe out any discomfort or tension. 7. Sleep Enough If you get less than seven to eight hours of sleep, your body won't tolerate stress as well as it could. If stress keeps you up at night, address the cause, and add extra meditation into your day to make up for the lost z's. Try to get seven to nine hours of sleep each night. Make a regular bedtime schedule. Keep your room dark and cool. Try to avoid computers, TV, cell phones and tablets before bed. 8. [place] with Connections You Enjoy Go out for a coffee with a friend, chat with a neighbor, call a family member, visit with a clergy member, or even hang out with your pet. Clinical studies show that spending even a short time with a companion animal can cut anxiety levels almost in half. 9. Take a Vacation Getting away from it all can reset your stress tolerance by increasing your mental and emotional outlook, which makes you a happier, more productive person upon return. Leave your cellphone and laptop at home! 10. See a Counselor, Coach or Therapist If negative thoughts overwhelm your ability to make positive changes, it's time to seek professional help. Make an appointment today--your health and life are worth it."        Follow up:  Patient agrees to Care Plan and Follow-up.  Plan: The Managed Medicaid care management team will reach out to the patient again over the next 60 days.  Date/time of next scheduled Social Work care management/care coordination outreach:  11/02/22  at 1045 am  Dickie La, BSW, MSW, [employer] Managed Medicaid LCSW [facility]  [employer] [email or web address] Phone: [phone]

## 2022-09-23 ENCOUNTER — Encounter: Payer: Self-pay | Admitting: Gastroenterology

## 2022-09-28 DIAGNOSIS — F4321 Adjustment disorder with depressed mood: Secondary | ICD-10-CM | POA: Diagnosis not present

## 2022-10-05 DIAGNOSIS — H4311 Vitreous hemorrhage, right eye: Secondary | ICD-10-CM | POA: Diagnosis not present

## 2022-10-05 DIAGNOSIS — H35033 Hypertensive retinopathy, bilateral: Secondary | ICD-10-CM | POA: Diagnosis not present

## 2022-10-05 DIAGNOSIS — E113513 Type 2 diabetes mellitus with proliferative diabetic retinopathy with macular edema, bilateral: Secondary | ICD-10-CM | POA: Diagnosis not present

## 2022-10-06 ENCOUNTER — Other Ambulatory Visit: Payer: Medicaid Other | Admitting: Obstetrics and Gynecology

## 2022-10-06 ENCOUNTER — Encounter: Payer: Self-pay | Admitting: Obstetrics and Gynecology

## 2022-10-06 NOTE — Patient Outreach (Signed)
Medicaid Managed Care   Nurse Care Manager Note  10/06/2022 Name:  Maria Hudson MRN:  [id number] DOB:  1960-11-14  Maria Hudson is an 62 y.o. year old female who is a primary patient of Maria Contras, MD.  The [facility] Managed Care Coordination team was consulted for assistance with:    Chronic healthcare management needs, HTN, retinopathy, CKD, DM, CAD, CHF, OSA, chronic pain, depression  Maria Hudson was given information about Medicaid Managed Care Coordination team services today. Maria Hudson Patient agreed to services and verbal consent obtained.  [name] for follow up visit in response to provider referral for case management and/or care coordination services.   Assessments/Interventions:  Review of past medical history, allergies, medications, health status, including review of consultants reports, laboratory and other test data, was performed as part of comprehensive evaluation and provision of chronic care management services.  SDOH (Social Determinants of Health) assessments and interventions performed: SDOH Interventions    Flowsheet Row Patient Outreach Telephone from 10/06/2022 in [place] POPULATION HEALTH DEPARTMENT Patient Outreach Telephone from 09/22/2022 in [place] POPULATION HEALTH DEPARTMENT Patient Outreach Telephone from 09/03/2022 in [place] POPULATION HEALTH DEPARTMENT Patient Outreach from 08/20/2022 in [place] POPULATION HEALTH DEPARTMENT Patient Outreach Telephone from 08/19/2022 in [place] POPULATION HEALTH DEPARTMENT Patient Outreach Telephone from 08/12/2022 in [place] POPULATION HEALTH DEPARTMENT  SDOH Interventions        Utilities Interventions -- -- Intervention Not Indicated -- -- --  Financial Strain Interventions -- -- -- Intervention Not Indicated -- --  Physical Activity Interventions Other (Comments)  [not phyasically able to enage in a moderate to strenuous exercise routine] -- -- -- -- --  Stress  Interventions -- [employer], Provide Counseling -- -- [employer], Provide Counseling Offered [employer], Provide Counseling       Care Plan  Allergies  Allergen Reactions   Metformin And Related Nausea Only   Other     Clonidine patch causes skin irritation    Hydrocodone Itching    Patient able to tolerate when taken with Benadryl.   Oxycodone-Acetaminophen Itching    Patient able to tolerate when taken with Benadryl.   Medications Reviewed Today     Reviewed by Maria Chandler, RN (Registered Nurse) on 10/06/22 at 0935  Med List Status: <None>   Medication Order Taking? Sig Documenting Provider Last Dose Status Informant  Accu-Chek Softclix Lancets lancets [id number] No 3 (three) times daily. [provider] Taking Active Self  acetaminophen (TYLENOL) 500 MG tablet [id number] No Take 1,000 mg by mouth every 6 (six) hours as needed for moderate pain or headache. [provider] Taking Active Self  allopurinol (ZYLOPRIM) 100 MG tablet [id number] No Take 100 mg by mouth in the morning. [provider] Taking Active Self  amLODipine (NORVASC) 10 MG tablet [id number] No Take 10 mg by mouth daily. [provider] Taking Active   aspirin EC 81 MG tablet [id number] No Take 81 mg by mouth in the morning. Swallow whole. [provider] Taking Active Self           Med Note Maria Hudson   Mon Apr 14, 2021  9:22 PM)    atorvastatin (LIPITOR) 40 MG tablet [id number] No Take 1 tablet (40 mg total) by mouth every evening. Maria Si, MD Taking Active Self  buPROPion [facility]) 100 MG tablet [id number] No Take 100 mg by mouth 2 (two) times daily. [provider] Taking Active  Self  carvedilol (COREG) 25 MG tablet [id number] No Take 1 tablet (25 mg total) by mouth 2 (two) times daily with a meal. [name], Maria Ram, MD Taking Active Self  cholecalciferol (VITAMIN D3)  25 MCG (1000 UNIT) tablet [id number] No Take 2,000 Units by mouth daily. [provider] Taking Active Self  cloNIDine (CATAPRES) 0.3 MG tablet [id number] No Take 0.3 mg by mouth 3 (three) times daily. [provider] Taking Active Self           Med Note Maria Hudson   Tue Mar 03, 2022  9:23 AM)    Continuous Blood Gluc Transmit (DEXCOM G6 TRANSMITTER) MISC [id number]  Use one every 3 month [provider]  Active   dapagliflozin propanediol (FARXIGA) 10 MG TABS tablet [id number] No Take 1 tablet (10 mg total) by mouth daily before breakfast. Maria Ganong, FNP Taking Active   ferrous sulfate 325 (65 FE) MG tablet [id number] No Take 325 mg by mouth 2 (two) times daily. [provider] Taking Active Self  fluticasone (FLONASE) 50 MCG/ACT nasal spray [id number] No Place 1 spray into both nostrils daily as needed for allergies or rhinitis. [provider] Taking Active Self  hydrALAZINE (APRESOLINE) 100 MG tablet [id number] No TAKE 1 TABLET BY MOUTH 3 TIMES DAILY. Maria Si, MD Taking Active   insulin glargine (LANTUS) 100 unit/mL SOPN [id number] No Inject 5 Units into the skin at bedtime. [provider] Taking Active Self           Med Note Maria Hudson, Maria Hudson   Thu Aug 20, 2022 10:26 AM) Taking 3-10 units QPM  isosorbide mononitrate (IMDUR) 120 MG 24 hr tablet [id number] No Take 120 mg by mouth daily. [provider] Taking Active   linaclotide Karlene Einstein) 145 MCG CAPS capsule [id number] No Take 1 capsule (145 mcg total) by mouth daily before breakfast. Zehr, Princella Pellegrini, PA-C Taking Active   loratadine (CLARITIN) 10 MG tablet [id number] No Take 10 mg by mouth in the morning. [provider] Taking Active Self  Multiple Vitamin (MULTIVITAMIN ADULT) TABS [id number] No Take 1 tablet by mouth in the morning. [provider] Taking Active Self  nitroGLYCERIN (NITROSTAT) 0.4 MG SL tablet [id number] No Place 1 tablet (0.4  mg total) under the tongue every 5 (five) minutes as needed for chest pain. [name], Maria Ram, MD Taking Active Self           Med Note Kittie Plater, Smitty Cords   Tue Aug 19, 2021  9:35 AM)    Rubbie Battiest FLEXPEN 100 UNIT/ML FlexPen [id number] No Inject 3 Units into the skin 3 (three) times daily with meals. [provider] Taking Active Self           Med Note Maria Hudson, Maria Hudson   Thu Aug 20, 2022 10:26 AM) 3-8 units with sliding scale  Omega-3 Fatty Acids (FISH OIL) 1200 MG CAPS [id number] No Take 1,200 mg by mouth in the morning. [provider] Taking Active Self  Polyethylene Glycol 3350 (MIRALAX PO) [id number] No Take by mouth daily as needed. [provider] Taking Active   pregabalin (LYRICA) 25 MG capsule [id number] No Take 25 mg by mouth 2 (two) times daily. [provider] Taking Active Self  Semaglutide, 1 MG/DOSE, (OZEMPIC, 1 MG/DOSE,) 4 MG/3ML SOPN [id number] No Inject 1 mg into the skin once a week. Jodelle Red, MD Taking Active   sertraline (ZOLOFT) 50 MG tablet [id number] No Take 50 mg by mouth in the morning. [provider] Taking Active Self  spironolactone (ALDACTONE) 50 MG tablet [id number] No Take 50 mg by mouth daily. [provider] Taking Active   torsemide (DEMADEX) 20 MG tablet [id number] No Take 1 tablet (20 mg total) by mouth daily as needed.  Patient taking differently: Take 20-40 mg by mouth daily as needed.   [place], Anderson Malta, FNP Taking Active   traZODone (DESYREL) 50 MG tablet [id number] No Take 50 mg by mouth at bedtime. [provider] Taking Active Self  TURMERIC CURCUMIN PO [id number] No Take 500 mg by mouth daily at 6 (six) AM. [provider] Taking Active            Patient Active Problem List   Diagnosis Date Noted   Chronic constipation 03/31/2022   Pain due to onychomycosis of toenails of both feet 12/05/2021   Pure hypercholesterolemia 05/27/2021   Chronic diastolic CHF  (congestive heart failure) 04/29/2021   Hypertensive urgency 04/14/2021   Acute on chronic diastolic CHF (congestive heart failure) 04/14/2021   Chest pain 03/05/2021   CAD (coronary artery disease) 03/05/2021   T2DM (type 2 diabetes mellitus) 03/05/2021   CKD (chronic kidney disease) stage 4, GFR 15-29 ml/min 03/05/2021   Class 3 obesity 03/05/2021   CVA (cerebral vascular accident) 03/05/2021   Acute on chronic diastolic heart failure 03/05/2021   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   AKI (acute kidney injury) 03/05/2021   Vitreous hemorrhage of left eye 11/14/2020   Orthopnea 08/06/2020   Vision loss of right eye 08/06/2020   Excessive daytime sleepiness 01/11/2020   Iron deficiency anemia 12/14/2019   Vitreous hemorrhage, right 12/04/2019   Diabetic neuropathy 07/26/2019   Proliferative diabetic retinopathy of both 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 02/03/2019   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   Conductive hearing loss, bilateral 03/03/2018   Chronic bilateral low back pain without sciatica 08/21/2015   Chronic kidney disease (CKD) stage G3b/A3, moderately decreased glomerular filtration rate (GFR) between 30-44 mL/min/1.73 square meter and albuminuria creatinine ratio greater than 300 mg/g 04/30/2015   Type 2 diabetes mellitus with diabetic polyneuropathy, with long-term current use of insulin 02/15/2015   Advance directive on file 05/17/2014   Refusal of blood transfusions as patient is Jehovah's Witness 05/17/2014   OSA treated with BiPAP 02/23/2014   Coronary artery disease 07/07/2013   Severe obesity (BMI >= 40) 05/18/2013   Neuropathy 04/11/2013   Diastolic heart failure 10/16/2012   Essential hypertension 10/16/2012   Conditions to be addressed/monitored per PCP order:  Chronic healthcare management needs, HTN, retinopathy, CKD, DM, CAD, CHF, OSA, chronic pain, depression  Care Plan : RN Care Manager Plan Of Care  Updates made by Maria Chandler, RN since 10/06/2022 12:00 AM     Problem: Knowledge Deficit and Care Coordination Needs Related to Management of HTN, HF, Type 2 DM   Priority: High     Long-Range Goal: Development of Plan of Care to Address Knowledge Deficits and Care Coordination Needs related to  management of CHF, CAD, HTN, HLD, DMII, CKD Stage 4, and Depression   Start Date: 05/06/2021  Expected End Date: 12/04/2022  Priority: High  Note:   Current Barriers:  Knowledge Deficits related to plan of care for management of CHF, CAD, HTN, HLD, DMII, CKD Stage 4, and Depression 10/06/22:  BP 147/70-has appt with [facility] 4/30.  Blood sugars 90-179, has ENDO appt in May.  Recent appts with Ophthalmology and Nephrology.  Continues to see  [facility].  Patient complaining of pain/doscomfort at fistula site-to f/u with provider.  Patient requests dental and PCP resources-referral placed.    RNCM Clinical Goal(s):  Patient will demonstrate ongoing adherence to prescribed treatment plan for CHF, CAD, HTN, HLD, DMII, CKD Stage 4, and Depression as evidenced by patient report of improved health and quality of life and no unplanned ED or unplanned hospital admissions continue to work with RN Care Manager and/or Social Worker to address care management and care coordination needs related to CHF, CAD, HTN, HLD, DMII, CKD Stage 4, and Depression as evidenced by adherence to CM Team Scheduled appointments      Work with [facility] LCSW and psychiatrist to treat depression Patient to schedule  an appt with ENDO-scheduled  for May  Interventions: Inter-disciplinary care team collaboration (see longitudinal plan of care) Evaluation of current treatment plan related to  self management and patient's adherence to plan as established by provider Mailed summary of benefits for her [facility] Managed Medicaid plan to her home address Collaborated with Care Guide for dental resources-completed. Care Guide referral for dental  resources Collaborated with BSW for assistance with [facility] appt.-completed. BSW referral for appt assistance. Collaborated with Pharmacy regarding  uncontrolled HTN Pharmacy referral for uncontrolled HTN-completed. Collaborated with LCSW for depression LCSW referral for depression-completed BSW referral for dental and PCP resources. Collaborated with BSW  CAD Interventions: (Status:  New goal.) Long Term Goal Assessed understanding of CAD diagnosis Medications reviewed including medications utilized in CAD treatment plan Provided education on importance of blood pressure control in management of CAD Counseled on importance of regular laboratory monitoring as prescribed Reviewed Importance of taking all medications as prescribed Reviewed Importance of attending all scheduled provider appointments Advised to report any changes in symptoms or exercise tolerance Assessed social determinant of health barriers  Hyperlipidemia Interventions:  (Status:  New goal.) Long Term Goal Medication review performed; medication list updated in electronic medical record.  Provider established cholesterol goals reviewed Counseled on importance of regular laboratory monitoring as prescribed Reviewed importance of limiting foods high in cholesterol Assessed social determinant of health barriers     Heart Failure Interventions:  (Status: Goal on Track (progressing): YES.)  Long Term Goal - patient states her fluid retention has significantly improved,  says she cannot tolerate the compression stockings because they are too tight and make her legs hurt Assessed patient's heart failure management strategies  Diabetes:  (Status: Goal on Track (progressing): YES.) Long Term Goal   Lab Results  Component Value Date   HGBA1C 6.7 (H) 04/29/2021   Hgn A1C=6.3 on 12/26/21 Hgb A1C=5.2 in October per patient HGBA1C=5.8 in March per patient  Assessed patient's understanding of A1C goal: <7% Reviewed  medications with patient and discussed importance of medication adherence;        Counseled on importance of regular laboratory monitoring as prescribed;        Discussed plans with patient for ongoing care management follow up and provided patient with direct contact information for care management team;      Advised patient, providing education and rationale, to check cbg three times daily and record        call provider for findings outside established parameters;       Review of patient status, including review of consultants reports, relevant laboratory and other test results, and medications completed;       Discussed addition of Ozempic to medication regimen , reviewed mechanism of action and proven cardiovascular benefits To schedule  ENDO appt-discussed nutrition referral, [facility], [facility]-ENDO appt scheduled-patient declines referral.  Hypertension and CKD stage 4 : (Status: Goal on Track (progressing): YES.) Long Term Goal-  has been placed on kidney transplant waiting list, fistu;a placed 12/09/21,ready for use 03/11/22. Last practice recorded BP readings:  BP 04/01/22-145/87 BP 05/01/22-141/70 BP 08/04/22-155/70 BP readings today per patient, 09/03/22-180/70-85 BP Readings from Last 3 Encounters:  09/30/21 120/70  09/12/21 (!) 107/58  09/08/21 (!) 144/71     Most recent eGFR/CrCl:    No components found for: CRCL Lab Results  Component Value Date   NA 136 08/19/2021   K 3.9 08/19/2021   CREATININE 2.51 (H) 08/19/2021   EGFR 19 (L) 03/13/2021   GFRNONAA 22 (L) 08/19/2021   GLUCOSE 122 (H) 08/19/2021    Evaluation of current treatment plan related to hypertension self management and patient's adherence to plan as established by provider;   Reviewed medications with patient and discussed importance of compliance;  Discussed plans with patient for ongoing care management follow up and provided patient with direct contact information for care management  team; Advised patient, providing education and rationale, to monitor blood pressure daily and record, calling PCP for findings outside established parameters;  Reviewed scheduled/upcoming provider appointments  Patient with labile blood pressures, medication adjusted, patient to  f/u with nephrologist-appt in March, also PCP-completed-continues to follow with providers  Weight Loss Interventions:  (Status:  New goal.) Long Term Goal--patient has started Ozempic Discussed indications, mechanism of action, common side effects and CV benefits  of GLP1 Ozempic Assessed tolerance of Ozempic 08/04/22-patient states she has gained 7 pounds this month because she is not eating well-discussed nutrition referral, exercise, and [facility]-patient states she will check on these. 09/03/22: referrals declined  Interdisciplinary Collaboration:  (Status: Goal on Track (progressing): YES.) Long Term Goal  Collaborated with BSW to initiate plan of care to address needs related to Housing barriers, Inability to perform ADL's independently, and Inability to perform IADL's independently in patient with CHF, CAD, HTN, HLD, DM, CKD Stage 4, and Depression Assessed status of patient's involvement with behavioral health counselor Collaboration with pharmacist for medication review, discussion of ? Medication side effects causing chronic constipation and arranging for patient to received home delivery of medications in blister packs  Patient Goals/Self-Care Activities: Take medications as prescribed   Attend all scheduled provider appointments Call pharmacy for medication refills 3-7 days in advance of running out of medications Attend church or other social activities Perform all self care activities independently  Call provider office for new concerns or questions  Work with the social worker to address care coordination needs and will continue to work with the clinical team to address health care and  disease management related needs call office if I gain more than 2 pounds in one day or 5 pounds in one week keep legs up while sitting track weight in diary use salt in moderation watch for swelling in feet, ankles and legs every day weigh myself daily know when to call the doctorfor fluid weight gain or consistently abnormal BP readings at home track symptoms and what helps feel better or worse check blood sugar at prescribed times: three times daily enter blood sugar readings and medication or insulin into daily log take the blood sugar log to all doctor visits take the blood sugar meter to all doctor visits eat fish at least once per week fill half of plate with vegetables manage portion size check blood pressure daily  write blood pressure results in a log or diary learn about high blood pressure take blood pressure log to all doctor appointments call doctor for signs and symptoms of high blood pressure keep all doctor appointments take medications for blood pressure exactly as prescribed Limit fluid intake to 1 liter per day Collaborate with managed Medicaid team of BSW and pharmacist to address identified needs Work with cardiology pharmacist to assist with BP stabilization Work with [facility] LCSW and psychiatrist to treat depression Review summary of [employer] benefits mailed to home address and call to enroll in program that will benefit your chronic health issues Continue to use [facility] Care Management calendar to record weight , BP and CBGs so readings are all in one place Schedule dental appt Schedule ENDO appt-completed.   Long-Range Goal: Establish Plan of Care for Chronic Disease Management Needs   Priority: High  Note:   Timeframe:  Long-Range Goal Priority:  High Start Date:     11/12/21                        Expected End Date:  ongoing                     Follow Up Date: 11/05/22   - schedule appointment for vaccines needed due to my age or  health - schedule recommended health tests (blood work, mammogram, colonoscopy, pap test) - schedule and keep appointment for annual check-up    Why is this important?   Screening tests can find diseases early when they are easier to treat.  Your doctor or nurse will talk with you about which tests are important for you.  Getting shots for common diseases like the flu and shingles will help prevent them.  10/06/22:  Patient with recent appointments with Ophthalmology, Nephrology, [facility].  [facility] 4/30   Follow Up:  Patient agrees to Care Plan and Follow-up.  Plan: The Managed Medicaid care management team will reach out to the patient again over the next 30 business  days. and The  Patient has been provided with contact information for the Managed Medicaid care management team and has been advised to call with any health related questions or concerns.  Date/time of next scheduled RN care management/care coordination outreach: 11/05/22 at 1030

## 2022-10-06 NOTE — Patient Instructions (Signed)
Visit Information  Ms. Rosal was given information about Medicaid Managed Care team care coordination services as a part of their [facility] Medicaid benefit. Jozlynn Plaia verbally consented to engagement with the [facility] Managed Care team.   If you are experiencing a medical emergency, please call 911 or report to your local emergency department or urgent care.   If you have a non-emergency medical problem during routine business hours, please contact your provider's office and ask to speak with a nurse.   For questions related to your [facility] health plan, please call: [phone] or go here:[email or web address]  If you would like to schedule transportation through your [facility] plan, please call the following number at least 2 days in advance of your appointment: [phone].  You can also use the MTM portal or MTM mobile app to manage your rides. For the portal, please go to [email or web address]  Call the [facility] Crisis Line at [phone], at any time, 24 hours a day, 7 days a week. If you are in danger or need immediate medical attention call 911.  If you would like help to quit smoking, call 1-800-QUIT-NOW (819-202-8971) OR Espaol: 1-855-Djelo-Ya (4-132-440-1027) o para ms informacin haga clic aqu or Text READY to 253-664 to register via text  Ms. Brummell - following are the goals we discussed in your visit today:   Goals Addressed    Timeframe:  Long-Range Goal Priority:  High Start Date:     11/12/21                        Expected End Date:  ongoing                     Follow Up Date: 11/05/22   - schedule appointment for vaccines needed due to my age or health - schedule recommended health tests (blood work, mammogram, colonoscopy, pap test) - schedule and keep appointment for annual check-up    Why is this important?   Screening tests can find diseases early when they are easier to treat.  Your doctor or nurse will talk with you about  which tests are important for you.  Getting shots for common diseases like the flu and shingles will help prevent them.  10/06/22:  Patient with recent appointments with Ophthalmology, Nephrology, [facility].  [facility] V[facility] 4/30  Patient verbalizes understanding of instructions and care plan provided today and agrees to view in MyChart. Active MyChart status and patient understanding of how to access instructions and care plan via MyChart confirmed with patient.     The Managed Medicaid care management team will reach out to the patient again over the next 30 business  days.  The  Patient  has been provided with contact information for the Managed Medicaid care management team and has been advised to call with any health related questions or concerns.   Kathi Der RN, BSN [place]  [facility] Care Management Coordinator - Managed Medicaid High Risk [phone]   Following is a copy of your plan of care:  Care Plan : RN Care Manager Plan Of Care  Updates made by [name], RN since 10/06/2022 12:00 AM     Problem: Knowledge Deficit and Care Coordination Needs Related to Management of HTN, HF, Type 2 DM   Priority: High     Long-Range Goal: Development of Plan of Care to Address Knowledge Deficits and Care Coordination Needs related to  management of CHF, CAD,  HTN, HLD, DMII, CKD Stage 4, and Depression   Start Date: 05/06/2021  Expected End Date: 12/04/2022  Priority: High  Note:   Current Barriers:  Knowledge Deficits related to plan of care for management of CHF, CAD, HTN, HLD, DMII, CKD Stage 4, and Depression 10/06/22:  BP 147/70-has appt with Heart and Vascular 4/30.  Blood sugars 90-179, has ENDO appt in May.  Recent appts with Ophthalmology and Nephrology. Injections both eyes and medications adjusted by Nephrology.  Continues to see [facility].  Patient complaining of pain/doscomfort at fistula site-to f/u with provider.  Patient requests  dental and PCP resources-referral placed.    RNCM Clinical Goal(s):  Patient will demonstrate ongoing adherence to prescribed treatment plan for CHF, CAD, HTN, HLD, DMII, CKD Stage 4, and Depression as evidenced by patient report of improved health and quality of life and no unplanned ED or unplanned hospital admissions continue to work with RN Care Manager and/or Social Worker to address care management and care coordination needs related to CHF, CAD, HTN, HLD, DMII, CKD Stage 4, and Depression as evidenced by adherence to CM Team Scheduled appointments      Work with [facility] LCSW and psychiatrist to treat depression Patient to schedule  an appt with ENDO-scheduled  for May  Interventions: Inter-disciplinary care team collaboration (see longitudinal plan of care) Evaluation of current treatment plan related to  self management and patient's adherence to plan as established by provider Mailed summary of benefits for her [facility] Managed Medicaid plan to her home address Collaborated with Care Guide for dental resources-completed. Care Guide referral for dental resources Collaborated with BSW for assistance with [facility] appt.-completed. BSW referral for appt assistance. Collaborated with Pharmacy regarding  uncontrolled HTN Pharmacy referral for uncontrolled HTN-completed. Collaborated with LCSW for depression LCSW referral for depression-completed BSW referral for dental and PCP resources. Collaborated with BSW  CAD Interventions: (Status:  New goal.) Long Term Goal Assessed understanding of CAD diagnosis Medications reviewed including medications utilized in CAD treatment plan Provided education on importance of blood pressure control in management of CAD Counseled on importance of regular laboratory monitoring as prescribed Reviewed Importance of taking all medications as prescribed Reviewed Importance of attending all scheduled provider appointments Advised to report any changes  in symptoms or exercise tolerance Assessed social determinant of health barriers  Hyperlipidemia Interventions:  (Status:  New goal.) Long Term Goal Medication review performed; medication list updated in electronic medical record.  Provider established cholesterol goals reviewed Counseled on importance of regular laboratory monitoring as prescribed Reviewed importance of limiting foods high in cholesterol Assessed social determinant of health barriers     Heart Failure Interventions:  (Status: Goal on Track (progressing): YES.)  Long Term Goal - patient states her fluid retention has significantly improved,  says she cannot tolerate the compression stockings because they are too tight and make her legs hurt Assessed patient's heart failure management strategies  Diabetes:  (Status: Goal on Track (progressing): YES.) Long Term Goal   Lab Results  Component Value Date   HGBA1C 6.7 (H) 04/29/2021   Hgn A1C=6.3 on 12/26/21 Hgb A1C=5.2 in October per patient HGBA1C=5.8 in March per patient  Assessed patient's understanding of A1C goal: <7% Reviewed medications with patient and discussed importance of medication adherence;        Counseled on importance of regular laboratory monitoring as prescribed;        Discussed plans with patient for ongoing care management follow up and provided patient with direct contact  information for care management team;      Advised patient, providing education and rationale, to check cbg three times daily and record        call provider for findings outside established parameters;       Review of patient status, including review of consultants reports, relevant laboratory and other test results, and medications completed;       Discussed addition of Ozempic to medication regimen , reviewed mechanism of action and proven cardiovascular benefits To schedule ENDO appt-discussed nutrition referral, [facility], [facility]-ENDO appt scheduled-patient  declines referral.  Hypertension and CKD stage 4 : (Status: Goal on Track (progressing): YES.) Long Term Goal-  has been placed on kidney transplant waiting list, fistu;a placed 12/09/21,ready for use 03/11/22. Last practice recorded BP readings:  BP 04/01/22-145/87 BP 05/01/22-141/70 BP 08/04/22-155/70 BP readings today per patient, 09/03/22-180/70-85 BP Readings from Last 3 Encounters:  09/30/21 120/70  09/12/21 (!) 107/58  09/08/21 (!) 144/71     Most recent eGFR/CrCl:    No components found for: CRCL Lab Results  Component Value Date   NA 136 08/19/2021   K 3.9 08/19/2021   CREATININE 2.51 (H) 08/19/2021   EGFR 19 (L) 03/13/2021   GFRNONAA 22 (L) 08/19/2021   GLUCOSE 122 (H) 08/19/2021    Evaluation of current treatment plan related to hypertension self management and patient's adherence to plan as established by provider;   Reviewed medications with patient and discussed importance of compliance;  Discussed plans with patient for ongoing care management follow up and provided patient with direct contact information for care management team; Advised patient, providing education and rationale, to monitor blood pressure daily and record, calling PCP for findings outside established parameters;  Reviewed scheduled/upcoming provider appointments  Patient with labile blood pressures, medication adjusted, patient to  f/u with nephrologist-appt in March, also PCP-completed-continues to follow with providers  Weight Loss Interventions:  (Status:  New goal.) Long Term Goal--patient has started Ozempic Discussed indications, mechanism of action, common side effects and CV benefits  of GLP1 Ozempic Assessed tolerance of Ozempic 08/04/22-patient states she has gained 7 pounds this month because she is not eating well-discussed nutrition referral, exercise, and [facility]-patient states she will check on these. 09/03/22: referrals declined  Interdisciplinary Collaboration:   (Status: Goal on Track (progressing): YES.) Long Term Goal  Collaborated with BSW to initiate plan of care to address needs related to Housing barriers, Inability to perform ADL's independently, and Inability to perform IADL's independently in patient with CHF, CAD, HTN, HLD, DM, CKD Stage 4, and Depression Assessed status of patient's involvement with behavioral health counselor Collaboration with pharmacist for medication review, discussion of ? Medication side effects causing chronic constipation and arranging for patient to received home delivery of medications in blister packs  Patient Goals/Self-Care Activities: Take medications as prescribed   Attend all scheduled provider appointments Call pharmacy for medication refills 3-7 days in advance of running out of medications Attend church or other social activities Perform all self care activities independently  Call provider office for new concerns or questions  Work with the social worker to address care coordination needs and will continue to work with the clinical team to address health care and disease management related needs call office if I gain more than 2 pounds in one day or 5 pounds in one week keep legs up while sitting track weight in diary use salt in moderation watch for swelling in feet, ankles and legs every day  weigh myself daily know when to call the doctorfor fluid weight gain or consistently abnormal BP readings at home track symptoms and what helps feel better or worse check blood sugar at prescribed times: three times daily enter blood sugar readings and medication or insulin into daily log take the blood sugar log to all doctor visits take the blood sugar meter to all doctor visits eat fish at least once per week fill half of plate with vegetables manage portion size check blood pressure daily write blood pressure results in a log or diary learn about high blood pressure take blood pressure log to all doctor  appointments call doctor for signs and symptoms of high blood pressure keep all doctor appointments take medications for blood pressure exactly as prescribed Limit fluid intake to 1 liter per day Collaborate with managed Medicaid team of BSW and pharmacist to address identified needs Work with cardiology pharmacist to assist with BP stabilization Work with [facility] LCSW and psychiatrist to treat depression Review summary of Well[employer] benefits mailed to home address and call to enroll in program that will benefit your chronic health issues Continue to use [facility] Care Management calendar to record weight , BP and CBGs so readings are all in one place Schedule dental appt Schedule ENDO appt-completed

## 2022-10-07 ENCOUNTER — Other Ambulatory Visit: Payer: Medicaid Other

## 2022-10-07 NOTE — Patient Outreach (Signed)
Medicaid Managed Care Social Work Note  10/07/2022 Name:  Maria Hudson MRN:  [id number] DOB:  [birth year]  Maria Hudson is an 62 y.o. year old female who is a primary patient of Fleet Contras, MD.  The Medicaid Managed Care Coordination team was consulted for assistance with:  [facility]   Maria Hudson was given information about Medicaid Managed Care Coordination team services today. Maria Hudson Patient agreed to services and verbal consent obtained.  Engaged with patient  for by telephone forinitial visit in response to referral for case management and/or care coordination services.   Assessments/Interventions:  Review of past medical history, allergies, medications, health status, including review of consultants reports, laboratory and other test data, was performed as part of comprehensive evaluation and provision of chronic care management services.  SDOH: (Social Determinant of Health) assessments and interventions performed: SDOH Interventions    Flowsheet Row Patient Outreach Telephone from 10/06/2022 in [place] POPULATION HEALTH DEPARTMENT Patient Outreach Telephone from 09/22/2022 in [place] POPULATION HEALTH DEPARTMENT Patient Outreach Telephone from 09/03/2022 in [place] POPULATION HEALTH DEPARTMENT Patient Outreach from 08/20/2022 in [place] POPULATION HEALTH DEPARTMENT Patient Outreach Telephone from 08/19/2022 in [place] POPULATION HEALTH DEPARTMENT Patient Outreach Telephone from 08/12/2022 in  POPULATION HEALTH DEPARTMENT  SDOH Interventions        Utilities Interventions -- -- Intervention Not Indicated -- -- --  Financial Strain Interventions -- -- -- Intervention Not Indicated -- --  Physical Activity Interventions Other (Comments)  [not phyasically able to enage in a moderate to strenuous exercise routine] -- -- -- -- --  Stress Interventions -- [employer], Provide Counseling -- -- [employer], Provide Counseling Offered [employer], Provide Counseling     BSW completed a telephone outreach with patient, she stated she is in need of dental resources and PCP resources. Patient stated she received some housing resources awhile ago but all were no longer accepting applications. BSW will email patient a list of resources for dental and housing, and provided patient with getcarenow information.  Advanced Directives Status:  Not addressed in this encounter.  Care Plan                 Allergies  Allergen Reactions   Metformin And Related Nausea Only   Other     Clonidine patch causes skin irritation    Hydrocodone Itching    Patient able to tolerate when taken with Benadryl.   Oxycodone-Acetaminophen Itching    Patient able to tolerate when taken with Benadryl.    Medications Reviewed Today     Reviewed by [name], RN (Registered Nurse) on 10/06/22 at 0935  Med List Status: <None>   Medication Order Taking? Sig Documenting Provider Last Dose Status Informant  Accu-Chek Softclix Lancets lancets [id number] No 3 (three) times daily. [provider] Taking Active Self  acetaminophen (TYLENOL) 500 MG tablet [id number] No Take 1,000 mg by mouth every 6 (six) hours as needed for moderate pain or headache. [provider] Taking Active Self  allopurinol (ZYLOPRIM) 100 MG tablet [id number] No Take 100 mg by mouth in the morning. [provider] Taking Active Self  amLODipine (NORVASC) 10 MG tablet [id number] No Take 10 mg by mouth daily. [provider] Taking Active   aspirin EC 81 MG tablet [id number] No Take 81 mg by mouth in the morning. Swallow whole. [provider] Taking Active Self  Med Note [name]   Mon Apr 14, 2021  9:22 PM)    atorvastatin (LIPITOR) 40 MG tablet [id number] No Take 1 tablet (40 mg total) by mouth every evening. Chilton Si, MD Taking Active Self  buPROPion  [facility]) 100 MG tablet [id number] No Take 100 mg by mouth 2 (two) times daily. [provider] Taking Active Self  carvedilol (COREG) 25 MG tablet [id number] No Take 1 tablet (25 mg total) by mouth 2 (two) times daily with a meal. [name], York Ram, MD Taking Active Self  cholecalciferol (VITAMIN D3) 25 MCG (1000 UNIT) tablet [id number] No Take 2,000 Units by mouth daily. [provider] Taking Active Self  cloNIDine (CATAPRES) 0.3 MG tablet [id number] No Take 0.3 mg by mouth 3 (three) times daily. [provider] Taking Active Self           Med Note Suezanne Cheshire   Tue Mar 03, 2022  9:23 AM)    Continuous Blood Gluc Transmit (DEXCOM G6 TRANSMITTER) MISC [id number]  Use one every 3 month [provider]  Active   dapagliflozin propanediol (FARXIGA) 10 MG TABS tablet [id number] No Take 1 tablet (10 mg total) by mouth daily before breakfast. Jacklynn Ganong, FNP Taking Active   ferrous sulfate 325 (65 FE) MG tablet [id number] No Take 325 mg by mouth 2 (two) times daily. [provider] Taking Active Self  fluticasone (FLONASE) 50 MCG/ACT nasal spray [id number] No Place 1 spray into both nostrils daily as needed for allergies or rhinitis. [provider] Taking Active Self  hydrALAZINE (APRESOLINE) 100 MG tablet [id number] No TAKE 1 TABLET BY MOUTH 3 TIMES DAILY. Chilton Si, MD Taking Active   insulin glargine (LANTUS) 100 unit/mL SOPN [id number] No Inject 5 Units into the skin at bedtime. [provider] Taking Active Self           Med Note Clearance Coots, CATHERINE T   Thu Aug 20, 2022 10:26 AM) Taking 3-10 units QPM  isosorbide mononitrate (IMDUR) 120 MG 24 hr tablet [id number] No Take 120 mg by mouth daily. [provider] Taking Active   linaclotide Karlene Einstein) 145 MCG CAPS capsule [id number] No Take 1 capsule (145 mcg total) by mouth daily before breakfast. Zehr, Princella Pellegrini, PA-C Taking Active   loratadine (CLARITIN) 10  MG tablet [id number] No Take 10 mg by mouth in the morning. [provider] Taking Active Self  Multiple Vitamin (MULTIVITAMIN ADULT) TABS [id number] No Take 1 tablet by mouth in the morning. [provider] Taking Active Self  nitroGLYCERIN (NITROSTAT) 0.4 MG SL tablet [id number] No Place 1 tablet (0.4 mg total) under the tongue every 5 (five) minutes as needed for chest pain. [name], York Ram, MD Taking Active Self           Med Note Kittie Plater, Smitty Cords   Tue Aug 19, 2021  9:35 AM)    Rubbie Battiest FLEXPEN 100 UNIT/ML FlexPen [id number] No Inject 3 Units into the skin 3 (three) times daily with meals. [provider] Taking Active Self           Med Note Clearance Coots, CATHERINE T   Thu Aug 20, 2022 10:26 AM) 3-8 units with sliding scale  Omega-3 Fatty Acids (FISH OIL) 1200 MG CAPS [id number] No Take 1,200 mg by mouth in the morning. [provider] Taking Active Self  Polyethylene Glycol 3350 (MIRALAX PO) [id number] No Take by mouth daily as needed. [provider] Taking Active   pregabalin (LYRICA) 25 MG capsule  [id number] No Take 25 mg by mouth 2 (two) times daily. [provider] Taking Active Self  Semaglutide, 1 MG/DOSE, (OZEMPIC, 1 MG/DOSE,) 4 MG/3ML SOPN [id number] No Inject 1 mg into the skin once a week. Jodelle Red, MD Taking Active   sertraline (ZOLOFT) 50 MG tablet [id number] No Take 50 mg by mouth in the morning. [provider] Taking Active Self  spironolactone (ALDACTONE) 50 MG tablet [id number] No Take 50 mg by mouth daily. [provider] Taking Active   torsemide (DEMADEX) 20 MG tablet [id number] No Take 1 tablet (20 mg total) by mouth daily as needed.  Patient taking differently: Take 20-40 mg by mouth daily as needed.   Pilgrim, Anderson Malta, FNP Taking Active   traZODone (DESYREL) 50 MG tablet [id number] No Take 50 mg by mouth at bedtime. [provider] Taking Active Self  TURMERIC CURCUMIN PO [id number]  No Take 500 mg by mouth daily at 6 (six) AM. [provider] Taking Active             Patient Active Problem List   Diagnosis Date Noted   Chronic constipation 03/31/2022   Pain due to onychomycosis of toenails of both feet 12/05/2021   Pure hypercholesterolemia 05/27/2021   Chronic diastolic CHF (congestive heart failure) 04/29/2021   Hypertensive urgency 04/14/2021   Acute on chronic diastolic CHF (congestive heart failure) 04/14/2021   Chest pain 03/05/2021   CAD (coronary artery disease) 03/05/2021   T2DM (type 2 diabetes mellitus) 03/05/2021   CKD (chronic kidney disease) stage 4, GFR 15-29 ml/min 03/05/2021   Class 3 obesity 03/05/2021   CVA (cerebral vascular accident) 03/05/2021   Acute on chronic diastolic heart failure 03/05/2021   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   AKI (acute kidney injury) 03/05/2021   Vitreous hemorrhage of left eye 11/14/2020   Orthopnea 08/06/2020   Vision loss of right eye 08/06/2020   Excessive daytime sleepiness 01/11/2020   Iron deficiency anemia 12/14/2019   Vitreous hemorrhage, right 12/04/2019   Diabetic neuropathy 07/26/2019   Proliferative diabetic retinopathy of both 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 02/03/2019   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   Conductive hearing loss, bilateral 03/03/2018   Chronic bilateral low back pain without sciatica 08/21/2015   Chronic kidney disease (CKD) stage G3b/A3, moderately decreased glomerular filtration rate (GFR) between 30-44 mL/min/1.73 square meter and albuminuria creatinine ratio greater than 300 mg/g 04/30/2015   Type 2 diabetes mellitus with diabetic polyneuropathy, with long-term current use of insulin 02/15/2015   Advance directive on file 05/17/2014   Refusal of blood transfusions as patient is Jehovah's Witness 05/17/2014   OSA treated with BiPAP 02/23/2014   Coronary artery disease 07/07/2013   Severe obesity (BMI >= 40) 05/18/2013    Neuropathy 04/11/2013   Diastolic heart failure 10/16/2012   Essential hypertension 10/16/2012    Conditions to be addressed/monitored per PCP order:   community resources  Care Plan : General Social Work (Adult)  Updates made by Shaune Leeks since 10/07/2022 12:00 AM     Problem: Anxiety Identification (Anxiety)      Long-Range Goal: Anxiety Symptoms Identified   Start Date: 05/16/2021  Priority: High  Note:    Timeframe:  Long-Range Goal Priority:  High Start Date:    07/09/22                 Expected End Date:   ongoing  Follow Up Date- 11/02/22 at 10 45 am  Current barriers:   Chronic Mental Health needs related to depression Mental Health Concerns  and Social Isolation Needs Support, Education, and Care Coordination in order to meet unmet mental health needs. Clinical Goal(s): demonstrate a reduction in symptoms related to :Depression: loss of energy/fatigue feelings of worthlessness, guilt  verbalize understanding of plan for management of Depression   Clinical Interventions:  Assessed patient's previous and current treatment, coping skills, support system and barriers to care  Depression screen reviewed  Active listening / Reflection utilized  Behavioral Activation reviewed Provided brief CBT  Suicidal Ideation/Homicidal Ideation assessed: Discussed Guardianship and reviewed process  Discussed Health Care Power of Attorney  Discussed referral for psychiatry , patient does not wish to pursue a referral for psychiatry at this time Made referral to [facility] for counseling on 07/09/22 and email was sent to patient today as well with mental health resources Healthy self-care education was provided during 07/17/22 session due to patient being sick this past week [facility] LCSW 07/17/22 update- Patient reports that she has already received a call from [facility] regarding the therapy referral [facility] LCSW made. She said she missed this call due to being  sick and will make sure to contact them back for scheduling soon. Patient was encouraged to drink a lot of fluids to help hydrate her body and she was encouraged to get some rest. 07/29/22 Update- Patient reports that she is playing phone tag with [facility] but they are both trying to reach each other and have not been able to do so but she will return their last call and will notify [facility] LCSW if she continues to have issues. Patient reports that she is working actively on fighting her negative feelings and emotions in order to live her life more positively. LCSW 08/12/22 update- Patient reports that she has been unable to establish contact with [facility] so [facility] LCSW and patient completed joint call to agency and left a message. [facility] returned call to [facility] LCSW and stated that their therapist and has been trying to reach patient to get her scheduled but has been unable to reach her. Patient was called and provided this update and is agreeable to contacting therapist directly given the contact information left in her voice messages. Self-care education provided and an addition email was sent to her as well with resources/suggested self-care tips and tricks. [facility] LCSW 08/19/22 update- Patient reports that she has still been having issues establishing contact with [facility] so [facility] LCSW completed joint phone call with patient to agency and was able to successfully schedule her for in person bi weekly counseling sessions every other Monday at 5 pm. Patient's first appointment is next Monday on 08/24/22 at 5 pm. Email was sent to patient with appointment reminders and tips/tricks for self-care. Patient has not been successfully established with a long term therapist at [facility] and will have weekly therapy sessions on Mondays at 9 am. Patient reports having a successful first session with her new therapist. 09/22/22- Patient confirms attending regular weekly sessions with her therapist. She  reports that she has been able to build great rapport with him. She declines any concerns. She reports that she is unsure if she needs a psychiatry referral at this time. Patient will dive deep into therapy over the next 30 days and then will consider if she needs to make any adjustments to her current psychiatric medications.   BSW completed a telephone  outreach with patient, she stated she is in need of dental resources and PCP resources. Patient stated she received some housing resources awhile ago but all were no longer accepting applications. BSW will email patient a list of resources for dental and housing, and provided patient with getcarenow information.  Review resources, discussed options and provided patient information about  Options for mental health treatment based on need and insurance  Inter-disciplinary care team collaboration (see longitudinal plan of care) Patient Goals/Self-Care Activities: Over the next 120 days REFERRAL PLACED I have placed a referral for counseling at [facility] and they will contact you.  If you have not heard from them in for counseling at [facility] and  please follow up.  The following coping skill education was provided for stress relief and mental health management: "When your car dies or a deadline looms, how do you respond? Long-term, low-grade or acute stress takes a serious toll on your body and mind, so don't ignore feelings of constant tension. Stress is a natural part of life. However, too much stress can harm our health, especially if it continues every day. This is chronic stress and can put you at risk for heart problems like heart disease and depression. Understand what's happening inside your body and learn simple coping skills to combat the negative impacts of everyday stressors.  Types of Stress There are two types of stress: Emotional - types of emotional stress are relationship problems, pressure at work, financial worries,  experiencing discrimination or having a major life change. Physical - Examples of physical stress include being sick having pain, not sleeping well, recovery from an injury or having an alcohol and drug use disorder. Fight or Flight Sudden or ongoing stress activates your nervous system and floods your bloodstream with adrenaline and cortisol, two hormones that raise blood pressure, increase heart rate and spike blood sugar. These changes pitch your body into a fight or flight response. That enabled our ancestors to outrun saber-toothed tigers, and it's helpful today for situations like dodging a car accident. But most modern chronic stressors, such as finances or a challenging relationship, keep your body in that heightened state, which hurts your health. Effects of Too Much Stress If constantly under stress, most of [place] will eventually start to function less well.  Multiple studies link chronic stress to a higher risk of heart disease, stroke, depression, weight gain, memory loss and even premature death, so it's important to recognize the warning signals. Talk to your doctor about ways to manage stress if you're experiencing any of these symptoms: Prolonged periods of poor sleep. Regular, severe headaches. Unexplained weight loss or gain. Feelings of isolation, withdrawal or worthlessness. Constant anger and irritability. Loss of interest in activities. Constant worrying or obsessive thinking. Excessive alcohol or drug use. Inability to concentrate.  10 Ways to Cope with Chronic Stress It's key to recognize stressful situations as they occur because it allows you to focus on managing how you react. We all need to know when to close our eyes and take a deep breath when we feel tension rising. Use these tips to prevent or reduce chronic stress. 1. Rebalance Work and Home All work and no play? If you're spending too much time at the office, intentionally put more dates in your calendar to enjoy  time for fun, either alone or with others. 2. Get Regular Exercise Moving your body on a regular basis balances the nervous system and increases blood circulation, helping to flush out stress hormones.  Even a daily 20-minute walk makes a difference. Any kind of exercise can lower stress and improve your mood ? just pick activities that you enjoy and make it a regular habit. 3. Eat Well and Limit Alcohol and Stimulants Alcohol, nicotine and caffeine may temporarily relieve stress but have negative health impacts and can make stress worse in the long run. Well-nourished bodies cope better, so start with a good breakfast, add more organic fruits and vegetables for a well-balanced diet, avoid processed foods and sugar, try herbal tea and drink more water. 4. Connect with Supportive People Talking face to face with another person releases hormones that reduce stress. Lean on those good listeners in your life. 5. Carve Out Hobby Time Do you enjoy gardening, reading, listening to music or some other creative pursuit? Engage in activities that bring you pleasure and joy; research shows that reduces stress by almost half and lowers your heart rate, too. 6. Practice Meditation, Stress Reduction or Yoga Relaxation techniques activate a state of restfulness that counterbalances your body's fight-or-flight hormones. Even if this also means a 10-minute break in a long day: listen to music, read, go for a walk in nature, do a hobby, take a bath or spend time with a friend. Also consider doing a mindfulness exercise or try a daily deep breathing or imagery practice. Deep Breathing Slow, calm and deep breathing can help you relax. Try these steps to focus on your breathing and repeat as needed. Find a comfortable position and close your eyes. Exhale and drop your shoulders. Breathe in through your nose; fill your lungs and then your belly. Think of relaxing your body, quieting your mind and becoming calm and  peaceful. Breathe out slowly through your nose, relaxing your belly. Think of releasing tension, pain, worries or distress. Repeat steps three and four until you feel relaxed. Imagery This involves using your mind to excite the senses -- sound, vision, smell, taste and feeling. This may help ease your stress. Begin by getting comfortable and then do some slow breathing. Imagine a place you love being at. It could be somewhere from your childhood, somewhere you vacationed or just a place in your imagination. Feel how it is to be in the place you're imagining. Pay attention to the sounds, air, colors, and who is there with you. This is a place where you feel cared for and loved. All is well. You are safe. Take in all the smells, sounds, tastes and feelings. As you do, feel your body being nourished and healed. Feel the calm that surrounds you. Breathe in all the good. Breathe out any discomfort or tension. 7. Sleep Enough If you get less than seven to eight hours of sleep, your body won't tolerate stress as well as it could. If stress keeps you up at night, address the cause, and add extra meditation into your day to make up for the lost z's. Try to get seven to nine hours of sleep each night. Make a regular bedtime schedule. Keep your room dark and cool. Try to avoid computers, TV, cell phones and tablets before bed. 8. Reising with Connections You Enjoy Go out for a coffee with a friend, chat with a neighbor, call a family member, visit with a clergy member, or even hang out with your pet. Clinical studies show that spending even a short time with a companion animal can cut anxiety levels almost in half. 9. Take a Vacation Getting away from it all can reset your stress tolerance  by increasing your mental and emotional outlook, which makes you a happier, more productive person upon return. Leave your cellphone and laptop at home! 10. See a Counselor, Coach or Therapist If negative thoughts overwhelm  your ability to make positive changes, it's time to seek professional help. Make an appointment today--your health and life are worth it."        Follow up:  Patient agrees to Care Plan and Follow-up.  Plan: The Managed Medicaid care management team will reach out to the patient again over the next 30 days.  Date/time of next scheduled Social Work care management/care coordination outreach:  11/06/22  Gus Puma, Kenard Gower, [facility]  Managed [facility] Social Worker [phone]

## 2022-10-07 NOTE — Patient Instructions (Signed)
Visit Information  [name] was given information about Medicaid Managed Care team care coordination services as a part of their [facility] Medicaid benefit. Maria Hudson verbally consented to engagement with the [facility] Managed Care team.   If you are experiencing a medical emergency, please call 911 or report to your local emergency department or urgent care.   If you have a non-emergency medical problem during routine business hours, please contact your provider's office and ask to speak with a nurse.   For questions related to your [facility] health plan, please call: [phone] or go here:[email or web address]  If you would like to schedule transportation through your [facility] plan, please call the following number at least 2 days in advance of your appointment: [phone].  You can also use the MTM portal or MTM mobile app to manage your rides. For the portal, please go to [email or web address]  Call the [facility] Crisis Line at [phone], at any time, 24 hours a day, 7 days a week. If you are in danger or need immediate medical attention call 911.  If you would like help to quit smoking, call 1-800-QUIT-NOW (272-586-8762) OR Espaol: 1-855-Djelo-Ya (2-725-366-4403) o para ms informacin haga clic aqu or Text READY to 474-259 to register via text  Maria Hudson - following are the goals we discussed in your visit today:   Goals Addressed   None      Social Worker will follow up in 30 days.   Maria Hudson, Maria Hudson, MHA [facility]  Managed Medicaid Social Worker [phone]   Following is a copy of your plan of care:  Care Plan : General Social Work (Adult)  Updates made by Maria Hudson since 10/07/2022 12:00 AM     Problem: Anxiety Identification (Anxiety)      Long-Range Goal: Anxiety Symptoms Identified   Start Date: 05/16/2021  Priority: High  Note:    Timeframe:  Long-Range Goal Priority:  High Start Date:    07/09/22                  Expected End Date:   ongoing                     Follow Up Date- 11/02/22 at 10 45 am  Current barriers:   Chronic Mental Health needs related to depression Mental Health Concerns  and Social Isolation Needs Support, Education, and Care Coordination in order to meet unmet mental health needs. Clinical Goal(s): demonstrate a reduction in symptoms related to :Depression: loss of energy/fatigue feelings of worthlessness, guilt  verbalize understanding of plan for management of Depression   Clinical Interventions:  Assessed patient's previous and current treatment, coping skills, support system and barriers to care  Depression screen reviewed  Active listening / Reflection utilized  Behavioral Activation reviewed Provided brief CBT  Suicidal Ideation/Homicidal Ideation assessed: Discussed Guardianship and reviewed process  Discussed Health Care Power of Attorney  Discussed referral for psychiatry , patient does not wish to pursue a referral for psychiatry at this time Made referral to [facility] for counseling on 07/09/22 and email was sent to patient today as well with mental health resources Healthy self-care education was provided during 07/17/22 session due to patient being sick this past week [facility] LCSW 07/17/22 update- Patient reports that she has already received a call from [facility] regarding the therapy referral [facility] LCSW made. She said she missed this call due to being sick and will make sure to contact them back for  scheduling soon. Patient was encouraged to drink a lot of fluids to help hydrate her body and she was encouraged to get some rest. 07/29/22 Update- Patient reports that she is playing phone tag with [facility] but they are both trying to reach each other and have not been able to do so but she will return their last call and will notify [facility] LCSW if she continues to have issues. Patient reports that she is working actively on fighting her negative  feelings and emotions in order to live her life more positively. LCSW 08/12/22 update- Patient reports that she has been unable to establish contact with [facility] so [facility] LCSW and patient completed joint call to agency and left a message. [facility] returned call to [facility] LCSW and stated that their therapist and has been trying to reach patient to get her scheduled but has been unable to reach her. Patient was called and provided this update and is agreeable to contacting therapist directly given the contact information left in her voice messages. Self-care education provided and an addition email was sent to her as well with resources/suggested self-care tips and tricks. [facility] LCSW 08/19/22 update- Patient reports that she has still been having issues establishing contact with [facility] so [facility] LCSW completed joint phone call with patient to agency and was able to successfully schedule her for in person bi weekly counseling sessions every other Monday at 5 pm. Patient's first appointment is next Monday on 08/24/22 at 5 pm. Email was sent to patient with appointment reminders and tips/tricks for self-care. Patient has not been successfully established with a long term therapist at [facility] and will have weekly therapy sessions on Mondays at 9 am. Patient reports having a successful first session with her new therapist. 09/22/22- Patient confirms attending regular weekly sessions with her therapist. She reports that she has been able to build great rapport with him. She declines any concerns. She reports that she is unsure if she needs a psychiatry referral at this time. Patient will dive deep into therapy over the next 30 days and then will consider if she needs to make any adjustments to her current psychiatric medications.   BSW completed a telephone outreach with patient, she stated she is in need of dental resources and PCP resources. Patient stated she received some housing resources awhile  ago but all were no longer accepting applications. BSW will email patient a list of resources for dental and housing, and provided patient with getcarenow information.  Review resources, discussed options and provided patient information about  Options for mental health treatment based on need and insurance  Inter-disciplinary care team collaboration (see longitudinal plan of care) Patient Goals/Self-Care Activities: Over the next 120 days REFERRAL PLACED I have placed a referral for counseling at [facility] and they will contact you.  If you have not heard from them in for counseling at [facility] and  please follow up.  The following coping skill education was provided for stress relief and mental health management: "When your car dies or a deadline looms, how do you respond? Long-term, low-grade or acute stress takes a serious toll on your body and mind, so don't ignore feelings of constant tension. Stress is a natural part of life. However, too much stress can harm our health, especially if it continues every day. This is chronic stress and can put you at risk for heart problems like heart disease and depression. Understand what's happening inside your body and learn  simple coping skills to combat the negative impacts of everyday stressors.  Types of Stress There are two types of stress: Emotional - types of emotional stress are relationship problems, pressure at work, financial worries, experiencing discrimination or having a major life change. Physical - Examples of physical stress include being sick having pain, not sleeping well, recovery from an injury or having an alcohol and drug use disorder. Fight or Flight Sudden or ongoing stress activates your nervous system and floods your bloodstream with adrenaline and cortisol, two hormones that raise blood pressure, increase heart rate and spike blood sugar. These changes pitch your body into a fight or flight response. That enabled  our ancestors to outrun saber-toothed tigers, and it's helpful today for situations like dodging a car accident. But most modern chronic stressors, such as finances or a challenging relationship, keep your body in that heightened state, which hurts your health. Effects of Too Much Stress If constantly under stress, most of [place] will eventually start to function less well.  Multiple studies link chronic stress to a higher risk of heart disease, stroke, depression, weight gain, memory loss and even premature death, so it's important to recognize the warning signals. Talk to your doctor about ways to manage stress if you're experiencing any of these symptoms: Prolonged periods of poor sleep. Regular, severe headaches. Unexplained weight loss or gain. Feelings of isolation, withdrawal or worthlessness. Constant anger and irritability. Loss of interest in activities. Constant worrying or obsessive thinking. Excessive alcohol or drug use. Inability to concentrate.  10 Ways to Cope with Chronic Stress It's key to recognize stressful situations as they occur because it allows you to focus on managing how you react. We all need to know when to close our eyes and take a deep breath when we feel tension rising. Use these tips to prevent or reduce chronic stress. 1. Rebalance Work and Home All work and no play? If you're spending too much time at the office, intentionally put more dates in your calendar to enjoy time for fun, either alone or with others. 2. Get Regular Exercise Moving your body on a regular basis balances the nervous system and increases blood circulation, helping to flush out stress hormones. Even a daily 20-minute walk makes a difference. Any kind of exercise can lower stress and improve your mood ? just pick activities that you enjoy and make it a regular habit. 3. Eat Well and Limit Alcohol and Stimulants Alcohol, nicotine and caffeine may temporarily relieve stress but have negative  health impacts and can make stress worse in the long run. Well-nourished bodies cope better, so start with a good breakfast, add more organic fruits and vegetables for a well-balanced diet, avoid processed foods and sugar, try herbal tea and drink more water. 4. Connect with Supportive People Talking face to face with another person releases hormones that reduce stress. Lean on those good listeners in your life. 5. Carve Out Hobby Time Do you enjoy gardening, reading, listening to music or some other creative pursuit? Engage in activities that bring you pleasure and joy; research shows that reduces stress by almost half and lowers your heart rate, too. 6. Practice Meditation, Stress Reduction or Yoga Relaxation techniques activate a state of restfulness that counterbalances your body's fight-or-flight hormones. Even if this also means a 10-minute break in a long day: listen to music, read, go for a walk in nature, do a hobby, take a bath or spend time with a friend. Also consider doing a mindfulness  exercise or try a daily deep breathing or imagery practice. Deep Breathing Slow, calm and deep breathing can help you relax. Try these steps to focus on your breathing and repeat as needed. Find a comfortable position and close your eyes. Exhale and drop your shoulders. Breathe in through your nose; fill your lungs and then your belly. Think of relaxing your body, quieting your mind and becoming calm and peaceful. Breathe out slowly through your nose, relaxing your belly. Think of releasing tension, pain, worries or distress. Repeat steps three and four until you feel relaxed. Imagery This involves using your mind to excite the senses -- sound, vision, smell, taste and feeling. This may help ease your stress. Begin by getting comfortable and then do some slow breathing. Imagine a place you love being at. It could be somewhere from your childhood, somewhere you vacationed or just a place in your  imagination. Feel how it is to be in the place you're imagining. Pay attention to the sounds, air, colors, and who is there with you. This is a place where you feel cared for and loved. All is well. You are safe. Take in all the smells, sounds, tastes and feelings. As you do, feel your body being nourished and healed. Feel the calm that surrounds you. Breathe in all the good. Breathe out any discomfort or tension. 7. Sleep Enough If you get less than seven to eight hours of sleep, your body won't tolerate stress as well as it could. If stress keeps you up at night, address the cause, and add extra meditation into your day to make up for the lost z's. Try to get seven to nine hours of sleep each night. Make a regular bedtime schedule. Keep your room dark and cool. Try to avoid computers, TV, cell phones and tablets before bed. 8. Kreiter with Connections You Enjoy Go out for a coffee with a friend, chat with a neighbor, call a family member, visit with a clergy member, or even hang out with your pet. Clinical studies show that spending even a short time with a companion animal can cut anxiety levels almost in half. 9. Take a Vacation Getting away from it all can reset your stress tolerance by increasing your mental and emotional outlook, which makes you a happier, more productive person upon return. Leave your cellphone and laptop at home! 10. See a Counselor, Coach or Therapist If negative thoughts overwhelm your ability to make positive changes, it's time to seek professional help. Make an appointment today--your health and life are worth it."

## 2022-10-12 DIAGNOSIS — F4321 Adjustment disorder with depressed mood: Secondary | ICD-10-CM | POA: Diagnosis not present

## 2022-10-13 ENCOUNTER — Encounter (HOSPITAL_COMMUNITY): Payer: Self-pay | Admitting: Cardiology

## 2022-10-13 ENCOUNTER — Ambulatory Visit (HOSPITAL_COMMUNITY)
Admission: RE | Admit: 2022-10-13 | Discharge: 2022-10-13 | Disposition: A | Discharge status: Home / Self Care | Payer: Medicaid Other | Source: Ambulatory Visit | Attending: Cardiology | Admitting: Cardiology

## 2022-10-13 VITALS — BP 120/80 | HR 73 | Wt 213.0 lb

## 2022-10-13 DIAGNOSIS — Z79899 Other long term (current) drug therapy: Secondary | ICD-10-CM | POA: Diagnosis not present

## 2022-10-13 DIAGNOSIS — G8929 Other chronic pain: Secondary | ICD-10-CM | POA: Insufficient documentation

## 2022-10-13 DIAGNOSIS — Z7982 Long term (current) use of aspirin: Secondary | ICD-10-CM | POA: Insufficient documentation

## 2022-10-13 DIAGNOSIS — I5032 Chronic diastolic (congestive) heart failure: Secondary | ICD-10-CM | POA: Diagnosis not present

## 2022-10-13 DIAGNOSIS — N184 Chronic kidney disease, stage 4 (severe): Secondary | ICD-10-CM | POA: Insufficient documentation

## 2022-10-13 DIAGNOSIS — G4733 Obstructive sleep apnea (adult) (pediatric): Secondary | ICD-10-CM | POA: Diagnosis not present

## 2022-10-13 DIAGNOSIS — E1122 Type 2 diabetes mellitus with diabetic chronic kidney disease: Secondary | ICD-10-CM | POA: Diagnosis not present

## 2022-10-13 DIAGNOSIS — I130 Hypertensive heart and chronic kidney disease with heart failure and stage 1 through stage 4 chronic kidney disease, or unspecified chronic kidney disease: Secondary | ICD-10-CM | POA: Insufficient documentation

## 2022-10-13 DIAGNOSIS — Z6837 Body mass index (BMI) 37.0-37.9, adult: Secondary | ICD-10-CM | POA: Insufficient documentation

## 2022-10-13 DIAGNOSIS — Z794 Long term (current) use of insulin: Secondary | ICD-10-CM | POA: Diagnosis not present

## 2022-10-13 DIAGNOSIS — R079 Chest pain, unspecified: Secondary | ICD-10-CM | POA: Insufficient documentation

## 2022-10-13 DIAGNOSIS — I2510 Atherosclerotic heart disease of native coronary artery without angina pectoris: Secondary | ICD-10-CM | POA: Diagnosis not present

## 2022-10-13 DIAGNOSIS — Z833 Family history of diabetes mellitus: Secondary | ICD-10-CM | POA: Diagnosis not present

## 2022-10-13 DIAGNOSIS — E669 Obesity, unspecified: Secondary | ICD-10-CM | POA: Insufficient documentation

## 2022-10-13 DIAGNOSIS — Z823 Family history of stroke: Secondary | ICD-10-CM | POA: Diagnosis not present

## 2022-10-13 DIAGNOSIS — Z8249 Family history of ischemic heart disease and other diseases of the circulatory system: Secondary | ICD-10-CM | POA: Insufficient documentation

## 2022-10-13 LAB — BASIC METABOLIC PANEL
Anion gap: 11 (ref 5–15)
BUN: 87 mg/dL — ABNORMAL HIGH (ref 6–20)
CO2: 19 mmol/L — ABNORMAL LOW (ref 22–32)
Calcium: 9.1 mg/dL (ref 8.9–10.3)
Chloride: 108 mmol/L (ref 98–111)
Creatinine, Ser: 2.92 mg/dL — ABNORMAL HIGH (ref 0.44–1.00)
GFR, Estimated: 18 mL/min — ABNORMAL LOW (ref >60)
Glucose, Bld: 127 mg/dL — ABNORMAL HIGH (ref 70–99)
Potassium: 4.1 mmol/L (ref 3.5–5.1)
Sodium: 138 mmol/L (ref 135–145)

## 2022-10-13 LAB — LIPID PANEL
Cholesterol: 118 mg/dL (ref 0–200)
HDL: 54 mg/dL (ref >40)
LDL Cholesterol: 54 mg/dL (ref 0–99)
Total CHOL/HDL Ratio: 2.2 RATIO
Triglycerides: 52 mg/dL (ref <150)
VLDL: 10 mg/dL (ref 0–40)

## 2022-10-13 NOTE — Patient Instructions (Addendum)
  Lab Work:  Labs done today, your results will be available in MyChart, we will contact you for abnormal readings.  Your physician has requested that you have an echocardiogram. Echocardiography is a painless test that uses sound waves to create images of your heart. It provides your doctor with information about the size and shape of your heart and how well your heart's chambers and valves are working. This procedure takes approximately one hour. There are no restrictions for this procedure. Please do NOT wear cologne, perfume, aftershave, or lotions (deodorant is allowed). Please arrive 15 minutes prior to your appointment time.   Follow-Up in:   Your physician recommends that you schedule a follow-up appointment in: 4 months with APP (August) *please call in July to schedule your follow up appointment**  Do the following things EVERYDAY: Weigh yourself in the morning before breakfast. Write it down and keep it in a log. Take your medicines as prescribed Eat low salt foods--Limit salt (sodium) to 2000 mg per day.  Stay as active as you can everyday Limit all fluids for the day to less than 2 liters    Need to Contact us:  If you have any questions or concerns before your next appointment please send [place] a message through [place] or call our office at [phone].    TO LEAVE A MESSAGE FOR THE NURSE SELECT OPTION 2, PLEASE LEAVE A MESSAGE INCLUDING: YOUR NAME DATE OF BIRTH CALL BACK NUMBER REASON FOR CALL**this is important as we prioritize the call backs  YOU WILL RECEIVE A CALL BACK THE SAME DAY AS LONG AS YOU CALL BEFORE 4:00 PM   At the [facility], you and your health needs are our priority. As part of our continuing mission to provide you with exceptional heart care, we have created designated Provider Care Teams. These Care Teams include your primary Cardiologist (physician) and Advanced Practice Providers (APPs- Physician Assistants and Nurse  Practitioners) who all work together to provide you with the care you need, when you need it.   You may see any of the following providers on your designated Care Team at your next follow up: Dr Arvilla Meres Dr Marca Ancona Dr. Marcos Eke, NP Robbie Lis, [place] [facility] [place], [place] Brynda Peon, NP Karle Plumber, PharmD   Please be sure to bring in all your medications bottles to every appointment.    Thank you for choosing [facility]

## 2022-10-13 NOTE — Progress Notes (Signed)
PCP: Fleet Contras, MD Nephrology: Dr. Malen Gauze HF Cardiology: Dr. Shirlee Latch  62 y.o. with history of supine hypertension with orthostatic hypotension, CKD stage 4, CAD, and chronic diastolic CHF was referred from [facility] clinic for evaluation of CHF.    Patient had MI in 05/2013. LHC showed 20% LAD, 50 to 60% left circumflex, and 100% RPDA stenosis with left-to-right collaterals. CAD managed medically.    She reports a long h/o diastolic heart failure and frequent hospitalizations. She moved to [place] from [facility], had been followed by [facility]. Has struggled w/ volume control and readjustment of diuretics over the years. She reports previously being prescribed Marcelline Deist but had to stop b/c her insurance would not cover it. Her last echo at [facility] was 7/21. LVEF was 55%, RV normal. RVSP elevated at 60 mmHg. PYP at [facility] was negative for amyloid (grade 1, H/CL 1.21). Serum IFE not suggestive of monoclonal gammopathy. Urine IFE and immunoglobin free light chains were elevated (lg FLCK 9.24, Ig FLCL 3.75, K/L ratio 2.46. Repeat testing recommended 6 months later, but do not see that this was completed).    Referred to HTN clinic 9/22 for uncontrolled HTN. Now on multidrug regimen. Thyroid function, renin, and aldosterone were normal, as were catecholamines and metanephrines. Her PTH was elevated at 134 but calcium was normal. Renal artery doppler study was limited. There was no evidence of right RAS however the left renal artery was not well visualized due to the presence of bowel gas.  Echo was repeated 9/22 showing normal LVEF 55-60%, G1DD and normal RV.    She has struggled w/ fluid management recently, c/b stage IV CKD. Baseline SCr ~2.3.  Followed by nephrology. Previously on Bumex and metolazone. She was admitted 10/22 for a/c diastolic CHF and diuresed w/ IV Lasix. Did not get RHC. Transitioned back to PO diuretics, Torsemide 40 mg once daily + spiro 25 mg daily. D/w Wt 231 lb. Referred to [facility] clinic.   She  was admitted again in 11/22 with chest pain, AKI, and orthostatic hypotension.  Torsemide was stopped.  HS-TnI was negative, suspect musculoskeletal chest pain.    PYP study repeated in 12/22 was not suggestive of cardiac amyloidosis.   She has had an AV fistula placed.    Today she returns for HF follow up. Follows closely with nephrology.  No dyspnea walking on flat ground but gets short of breath with stairs/inclines.  Rare atypical chest pain.  No orthopnea/PND.  No palpitations.   ECGC (personally reviewed): NSR, nonspecific lateral TWIs  Labs (11/22): K 3.8, creatinine 3.6 => 2.62 Labs (2/23): K 3.7, creatinine 3.31  Labs (4/23): K 4.5, creatinine 2.69, LDL 78, HDL 58 Labs (6/23): K 4.2, creatinine 2.65 Labs (9/23): K 3.6, creatinine 2.25 Labs (11/23): K 4.1, creatinine 2.52, BNP 81  PMH: 1. HTN: Urine catecholamines and metanephrines normal level, aldosterone normal level.  Complicated by orthostatic hypotension.   - Renal artery dopplers (11/22): No right renal artery stenosis, unable to visualize left renal artery due to overlying bowel gas.  2. Orthostatic hypotension 3. CKD stage 4 4. Type 2 diabetes 5. OSA: Uses CPAP 6. CAD: LHC in 2014 with totally occluded RCA with collaterals, medical management.  7. Chronic diastolic CHF: Echo (7/21) with EF 55%, normal RV, PASP 60 mmHg.  - PYP scan 11/20 at [facility] was negative with grade 1, H/CL 1.21.  - Echo (9/22): EF 55-60%, mild LVH, normal RV, IVC normal.  - RHC (11/22): mean RA 1, PA 41/10 mean 24,  mean PCWP 12, CI 3.  - PYP scan (12/22): grade 1, H/CL 1.  8. Gout 9. COVID-19 2/23  Social History   Socioeconomic History   Marital status: Divorced    Spouse name: Not on file   Number of children: 2   Years of education: Not on file   Highest education level: Some college, no degree  Occupational History   Occupation: retired   Occupation: disabled  Tobacco Use   Smoking status: Never    Passive exposure: Never    Smokeless tobacco: Never  Vaping Use   Vaping Use: Never used  Substance and Sexual Activity   Alcohol use: Never   Drug use: Never   Sexual activity: Not on file  Other Topics Concern   Not on file  Social History Narrative   Not on file   Social Determinants of Health   Financial Resource Strain: Low Risk  (08/04/2022)   Overall Financial Resource Strain (CARDIA)    Difficulty of Paying Living Expenses: Not very hard  Food Insecurity: No Food Insecurity (05/01/2022)   Hunger Vital Sign    Worried About Running Out of Food in the Last Year: Never true    Ran Out of Food in the Last Year: Never true  Transportation Needs: No Transportation Needs (08/04/2022)   PRAPARE - Administrator, Civil Service (Medical): No    Lack of Transportation (Non-Medical): No  Physical Activity: Inactive (10/06/2022)   Exercise Vital Sign    Days of Exercise per Week: 0 days    Minutes of Exercise per Session: 0 min  Stress: No Stress Concern Present (09/22/2022)   [employer] of Occupational Health - Occupational Stress Questionnaire    Feeling of Stress : Only a little  Recent Concern: Stress - Stress Concern Present (08/19/2022)   [employer] of Occupational Health - Occupational Stress Questionnaire    Feeling of Stress : To some extent  Social Connections: Moderately Isolated (09/03/2022)   Social Connection and Isolation Panel [NHANES]    Frequency of Communication with Friends and Family: More than three times a week    Frequency of Social Gatherings with Friends and Family: More than three times a week    Attends Religious Services: More than 4 times per year    Active Member of [employer] or Organizations: No    Attends Banker Meetings: Never    Marital Status: Divorced  Catering manager Violence: Not At Risk (10/06/2022)   Humiliation, Afraid, Rape, and Kick questionnaire    Fear of Current or Ex-Partner: No    Emotionally Abused: No    Physically Abused:  No    Sexually Abused: No   Family History  Problem Relation Age of Onset   Hypertension Mother    Stroke Mother    Diabetes Mother    Other Mother        enlarged heart   Hypertension Father    Hypertension Sister    Diabetes Sister    CAD Sister        enlarged heart   Kidney disease Sister    Diabetes Paternal Grandmother    Hypertension Daughter    Diabetes Daughter    Hypertension Son    Diabetes Son    Breast cancer Paternal Aunt    ROS: All systems reviewed and negative except as per HPI.   Current Outpatient Medications  Medication Sig Dispense Refill   Accu-Chek Softclix Lancets lancets 3 (three) times daily.  acetaminophen (TYLENOL) 500 MG tablet Take 1,000 mg by mouth every 6 (six) hours as needed for moderate pain or headache.     allopurinol (ZYLOPRIM) 100 MG tablet Take 100 mg by mouth in the morning.     amLODipine (NORVASC) 10 MG tablet Take 10 mg by mouth daily.     aspirin EC 81 MG tablet Take 81 mg by mouth in the morning. Swallow whole.     atorvastatin (LIPITOR) 40 MG tablet Take 1 tablet (40 mg total) by mouth every evening. 90 tablet 3   buPROPion (WELLBUTRIN) 100 MG tablet Take 100 mg by mouth 2 (two) times daily.     carvedilol (COREG) 25 MG tablet Take 1 tablet (25 mg total) by mouth 2 (two) times daily with a meal. 60 tablet 0   cholecalciferol (VITAMIN D3) 25 MCG (1000 UNIT) tablet Take 2,000 Units by mouth daily.     cloNIDine (CATAPRES) 0.3 MG tablet Take 0.3 mg by mouth 3 (three) times daily.     Continuous Blood Gluc Transmit (DEXCOM G6 TRANSMITTER) MISC Use one every 3 month     dapagliflozin propanediol (FARXIGA) 10 MG TABS tablet Take 1 tablet (10 mg total) by mouth daily before breakfast. 30 tablet 11   ferrous sulfate 325 (65 FE) MG tablet Take 325 mg by mouth 2 (two) times daily.     fluticasone (FLONASE) 50 MCG/ACT nasal spray Place 1 spray into both nostrils daily as needed for allergies or rhinitis.     hydrALAZINE (APRESOLINE) 100  MG tablet TAKE 1 TABLET BY MOUTH 3 TIMES DAILY. 270 tablet 2   insulin glargine (LANTUS) 100 unit/mL SOPN Inject 5 Units into the skin at bedtime.     isosorbide mononitrate (IMDUR) 120 MG 24 hr tablet Take 120 mg by mouth daily.     linaclotide (LINZESS) 145 MCG CAPS capsule Take 1 capsule (145 mcg total) by mouth daily before breakfast. 30 capsule 11   loratadine (CLARITIN) 10 MG tablet Take 10 mg by mouth in the morning.     Multiple Vitamin (MULTIVITAMIN ADULT) TABS Take 1 tablet by mouth in the morning.     nitroGLYCERIN (NITROSTAT) 0.4 MG SL tablet Place 1 tablet (0.4 mg total) under the tongue every 5 (five) minutes as needed for chest pain. 20 tablet 0   NOVOLOG FLEXPEN 100 UNIT/ML FlexPen Inject 3 Units into the skin 3 (three) times daily with meals.     Omega-3 Fatty Acids (FISH OIL) 1200 MG CAPS Take 1,200 mg by mouth in the morning.     Polyethylene Glycol 3350 (MIRALAX PO) Take by mouth daily as needed.     pregabalin (LYRICA) 25 MG capsule Take 25 mg by mouth 2 (two) times daily.     Semaglutide, 1 MG/DOSE, (OZEMPIC, 1 MG/DOSE,) 4 MG/3ML SOPN Inject 1 mg into the skin once a week. 3 mL 2   sertraline (ZOLOFT) 50 MG tablet Take 50 mg by mouth in the morning.     spironolactone (ALDACTONE) 50 MG tablet Take 50 mg by mouth daily.     torsemide (DEMADEX) 20 MG tablet Take 1 tablet (20 mg total) by mouth daily as needed. 90 tablet 3   traZODone (DESYREL) 50 MG tablet Take 50 mg by mouth at bedtime.     TURMERIC CURCUMIN PO Take 500 mg by mouth daily at 6 (six) AM.     No current facility-administered medications for this encounter.   Wt Readings from Last 3 Encounters:  10/13/22 96.6 kg (  213 lb)  04/17/22 94.8 kg (209 lb)  03/30/22 94.8 kg (209 lb)   BP 120/80   Pulse 73   Wt 96.6 kg (213 lb)   SpO2 96%   BMI 37.14 kg/m  General: NAD Neck: No JVD, no thyromegaly or thyroid nodule.  Lungs: Clear to auscultation bilaterally with normal respiratory effort. CV: Nondisplaced  PMI.  Heart regular S1/S2, no S3/S4, 2/6 SEM RUSB with clear S2.  No peripheral edema.  No carotid bruit.  Normal pedal pulses.  Abdomen: Soft, nontender, no hepatosplenomegaly, no distention.  Skin: Intact without lesions or rashes.  Neurologic: Alert and oriented x 3.  Psych: Normal affect. Extremities: No clubbing or cyanosis.  HEENT: Normal.   Assessment/Plan: 1. HTN: BP controlled on current regimen, generally around 130 systolic when she checks at home which is improved. - Continue Coreg 25 mg bid, clonidine at current dose, spironolactone 25 daily, hydralazine 100 mg tid and Imdur 120 mg daily.   - Continue amlodipine 10 mg daily.   2. Chronic diastolic CHF: Echo in 9/22 with EF 55-60%, normal RV. RHC in 11/22 with optimized filling pressures.  PYP scan, myeloma studies were not suggestive of cardiac amyloidosis. She has struggled with volume overload in setting of CKD stage IV.  Volume status looks ok today, NYHA class II symptoms.  - Continue Farxiga 10 mg daily.  - Continue torsemide 20 mg daily, BMET today.   - With Medicaid, was unable to get Cardiomems.  - I will arrange for echo.  3. CKD stage IV: BMET today.  - She is followed by Dr. Malen Gauze for nephrology.  4. CAD: H/o occluded PDA with collaterals.  No changes to chronic chest pain pattern.  - Continue ASA 81 daily.  - Continue atorvastatin, check lipids today.  5. Obesity: Body mass index is 37.14 kg/m. - She is on Ozempic. 6. OSA: Continue CPAP.  Follow up in 4 months with APP.   Marca Ancona 10/13/2022

## 2022-10-14 DIAGNOSIS — Z419 Encounter for procedure for purposes other than remedying health state, unspecified: Secondary | ICD-10-CM | POA: Diagnosis not present

## 2022-10-16 ENCOUNTER — Other Ambulatory Visit (HOSPITAL_COMMUNITY): Payer: Self-pay

## 2022-10-16 DIAGNOSIS — G4733 Obstructive sleep apnea (adult) (pediatric): Secondary | ICD-10-CM | POA: Diagnosis not present

## 2022-10-19 DIAGNOSIS — F4321 Adjustment disorder with depressed mood: Secondary | ICD-10-CM | POA: Diagnosis not present

## 2022-10-26 DIAGNOSIS — F4321 Adjustment disorder with depressed mood: Secondary | ICD-10-CM | POA: Diagnosis not present

## 2022-10-28 ENCOUNTER — Ambulatory Visit (HOSPITAL_COMMUNITY)
Admission: RE | Admit: 2022-10-28 | Discharge: 2022-10-28 | Disposition: A | Discharge status: Home / Self Care | Payer: Medicaid Other | Source: Ambulatory Visit | Attending: Cardiology | Admitting: Cardiology

## 2022-10-28 ENCOUNTER — Telehealth (HOSPITAL_COMMUNITY): Payer: Self-pay | Admitting: *Deleted

## 2022-10-28 DIAGNOSIS — I110 Hypertensive heart disease with heart failure: Secondary | ICD-10-CM | POA: Diagnosis not present

## 2022-10-28 DIAGNOSIS — E119 Type 2 diabetes mellitus without complications: Secondary | ICD-10-CM | POA: Insufficient documentation

## 2022-10-28 DIAGNOSIS — I2510 Atherosclerotic heart disease of native coronary artery without angina pectoris: Secondary | ICD-10-CM | POA: Insufficient documentation

## 2022-10-28 DIAGNOSIS — I5032 Chronic diastolic (congestive) heart failure: Secondary | ICD-10-CM | POA: Diagnosis not present

## 2022-10-28 LAB — ECHOCARDIOGRAM COMPLETE
Area-P 1/2: 3.85 cm2
Calc EF: 54.6 %
S' Lateral: 2.9 cm
Single Plane A2C EF: 51.2 %
Single Plane A4C EF: 56 %

## 2022-10-28 NOTE — Telephone Encounter (Signed)
Called patient per Dr. Shirlee Latch with following echo results:   "No significant abnormalities"   Pt verbalized understanding of same. No questions at this time.

## 2022-10-28 NOTE — Progress Notes (Signed)
Echocardiogram 2D Echocardiogram has been performed.  Augustine Radar 10/28/2022, 11:03 AM

## 2022-11-02 ENCOUNTER — Other Ambulatory Visit: Payer: Medicaid Other | Admitting: Licensed Clinical Social Worker

## 2022-11-02 DIAGNOSIS — F4321 Adjustment disorder with depressed mood: Secondary | ICD-10-CM | POA: Diagnosis not present

## 2022-11-02 NOTE — Patient Outreach (Signed)
Medicaid Managed Care Social Work Note  11/02/2022 Name:  Maria Hudson MRN:  [id number] DOB:  February 14, 1961  Maria Hudson is an 62 y.o. year old female who is a primary patient of Fleet Contras, MD.  The Medicaid Managed Care Coordination team was consulted for assistance with:  Mental Health Counseling and Resources  Ms. Comins was given information about Medicaid Managed Care Coordination team services today. Maria Hudson Patient agreed to services and verbal consent obtained.  Engaged with patient  for by telephone forfollow up visit in response to referral for case management and/or care coordination services.   Assessments/Interventions:  Review of past medical history, allergies, medications, health status, including review of consultants reports, laboratory and other test data, was performed as part of comprehensive evaluation and provision of chronic care management services.  SDOH: (Social Determinant of Health) assessments and interventions performed: SDOH Interventions    Flowsheet Row Patient Outreach Telephone from 11/02/2022 in [place] [facility] DEPART[facility] Patient Outreach Telephone from 10/06/2022 in [place] POPULATION HEALTH DEPARTMENT Patient Outreach Telephone from 09/22/2022 in [place] POPULATION HEALTH DEPARTMENT Patient Outreach Telephone from 09/03/2022 in [place] POPULATION HEALTH DEPARTMENT Patient Outreach from 08/20/2022 in [place] POPULATION HEALTH DEPARTMENT Patient Outreach Telephone from 08/19/2022 in [place] POPULATION HEALTH DEPARTMENT  SDOH Interventions        Utilities Interventions -- -- -- Intervention Not Indicated -- --  Financial Strain Interventions -- -- -- -- Intervention Not Indicated --  Physical Activity Interventions -- Other (Comments)  [not phyasically able to enage in a moderate to strenuous exercise routine] -- -- -- --  Stress Interventions Offered [employer], Provide Counseling -- Offered [employer], Provide Counseling -- -- [employer], Provide Counseling       Advanced Directives Status:  See Care Plan for related entries.  Care Plan                 Allergies  Allergen Reactions   Metformin And Related Nausea Only   Other     Clonidine patch causes skin irritation    Hydrocodone Itching    Patient able to tolerate when taken with Benadryl.   Oxycodone-Acetaminophen Itching    Patient able to tolerate when taken with Benadryl.    Medications Reviewed Today     Reviewed by Aguirre-Carachure, Nolon Rod, RN (Registered Nurse) on 10/13/22 at [id number]  Med List Status: <None>   Medication Order Taking? Sig Documenting Provider Last Dose Status Informant  Accu-Chek Softclix Lancets lancets [id number] Yes 3 (three) times daily. [provider] Taking Active Self  acetaminophen (TYLENOL) 500 MG tablet [id number] Yes Take 1,000 mg by mouth every 6 (six) hours as needed for moderate pain or headache. [provider] Taking Active Self  allopurinol (ZYLOPRIM) 100 MG tablet [id number] Yes Take 100 mg by mouth in the morning. [provider] Taking Active Self  amLODipine (NORVASC) 10 MG tablet [id number] Yes Take 10 mg by mouth daily. [provider] Taking Active   aspirin EC 81 MG tablet [id number] Yes Take 81 mg by mouth in the morning. Swallow whole. [provider] Taking Active Self           Med Note [name]   Mon Apr 14, 2021  9:22 PM)    atorvastatin (LIPITOR) 40 MG tablet [id number] Yes Take 1 tablet (40 mg total) by mouth every evening. Chilton Si, MD Taking Active Self  buPROPion [facility]) 100 MG tablet [id number] Yes Take  100 mg by mouth 2 (two) times daily. [provider] Taking Active Self  carvedilol (COREG) 25 MG tablet [id number] Yes Take 1 tablet (25 mg total) by mouth 2 (two) times daily with a meal. [name], York Ram, MD Taking Active Self   cholecalciferol (VITAMIN D3) 25 MCG (1000 UNIT) tablet [id number] Yes Take 2,000 Units by mouth daily. [provider] Taking Active Self  cloNIDine (CATAPRES) 0.3 MG tablet [id number] Yes Take 0.3 mg by mouth 3 (three) times daily. [provider] Taking Active Self           Med Note Suezanne Cheshire   Tue Mar 03, 2022  9:23 AM)    Continuous Blood Gluc Transmit (DEXCOM G6 TRANSMITTER) MISC [id number] Yes Use one every 3 month [provider] Taking Active   dapagliflozin propanediol (FARXIGA) 10 MG TABS tablet [id number] Yes Take 1 tablet (10 mg total) by mouth daily before breakfast. Jacklynn Ganong, FNP Taking Active   ferrous sulfate 325 (65 FE) MG tablet [id number] Yes Take 325 mg by mouth 2 (two) times daily. [provider] Taking Active Self  fluticasone (FLONASE) 50 MCG/ACT nasal spray [id number] Yes Place 1 spray into both nostrils daily as needed for allergies or rhinitis. [provider] Taking Active Self  hydrALAZINE (APRESOLINE) 100 MG tablet [id number] Yes TAKE 1 TABLET BY MOUTH 3 TIMES DAILY. Chilton Si, MD Taking Active   insulin glargine (LANTUS) 100 unit/mL SOPN [id number] Yes Inject 5 Units into the skin at bedtime. [provider] Taking Active Self           Med Note [name], [name]   Tue Oct 13, 2022  9:23 AM)    isosorbide mononitrate (IMDUR) 120 MG 24 hr tablet [id number] Yes Take 120 mg by mouth daily. [provider] Taking Active   linaclotide Karlene Einstein) 145 MCG CAPS capsule [id number] Yes Take 1 capsule (145 mcg total) by mouth daily before breakfast. Zehr, Princella Pellegrini, PA-C Taking Active   loratadine (CLARITIN) 10 MG tablet [id number] Yes Take 10 mg by mouth in the morning. [provider] Taking Active Self  Multiple Vitamin (MULTIVITAMIN ADULT) TABS [id number] Yes Take 1 tablet by mouth in the morning. [provider] Taking Active Self  nitroGLYCERIN (NITROSTAT) 0.4 MG  SL tablet [id number] Yes Place 1 tablet (0.4 mg total) under the tongue every 5 (five) minutes as needed for chest pain. [name], York Ram, MD Taking Active Self           Med Note Kittie Plater, Smitty Cords   Tue Aug 19, 2021  9:35 AM)    Rubbie Battiest FLEXPEN 100 UNIT/ML FlexPen [id number] Yes Inject 3 Units into the skin 3 (three) times daily with meals. [provider] Taking Active Self           Med Note [name], [name]   Tue Oct 13, 2022  9:23 AM)    Omega-3 Fatty Acids (FISH OIL) 1200 MG CAPS [id number] Yes Take 1,200 mg by mouth in the morning. [provider] Taking Active Self  Polyethylene Glycol 3350 (MIRALAX PO) [id number] Yes Take by mouth daily as needed. [provider] Taking Active   pregabalin (LYRICA) 25 MG capsule [id number] Yes Take 25 mg by mouth 2 (two) times daily. [provider] Taking Active Self  Semaglutide, 1 MG/DOSE, (OZEMPIC, 1 MG/DOSE,) 4 MG/3ML SOPN [id number] Yes Inject 1 mg into the skin once a week. Jodelle Red, MD Taking Active   sertraline (ZOLOFT) 50 MG tablet [id number]  Yes Take 50 mg by mouth in the morning. [provider] Taking Active Self  spironolactone (ALDACTONE) 50 MG tablet [id number] Yes Take 50 mg by mouth daily. [provider] Taking Active   torsemide (DEMADEX) 20 MG tablet [id number] Yes Take 1 tablet (20 mg total) by mouth daily as needed. [place], Anderson Malta, FNP Taking Active   traZODone (DESYREL) 50 MG tablet [id number] Yes Take 50 mg by mouth at bedtime. [provider] Taking Active Self  TURMERIC CURCUMIN PO [id number] Yes Take 500 mg by mouth daily at 6 (six) AM. [provider] Taking Active             Patient Active Problem List   Diagnosis Date Noted   Chronic constipation 03/31/2022   Pain due to onychomycosis of toenails of both feet 12/05/2021   Pure hypercholesterolemia 05/27/2021   Chronic diastolic CHF (congestive heart failure) (HCC)  04/29/2021   Hypertensive urgency 04/14/2021   Acute on chronic diastolic CHF (congestive heart failure) (HCC) 04/14/2021   Chest pain 03/05/2021   CAD (coronary artery disease) 03/05/2021   T2DM (type 2 diabetes mellitus) (HCC) 03/05/2021   CKD (chronic kidney disease) stage 4, GFR 15-29 ml/min (HCC) 03/05/2021   Class 3 obesity (HCC) 03/05/2021   CVA (cerebral vascular accident) (HCC) 03/05/2021   Acute on chronic diastolic heart failure (HCC) 03/05/2021   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   AKI (acute kidney injury) (HCC) 03/05/2021   Vitreous hemorrhage of left eye (HCC) 11/14/2020   Orthopnea 08/06/2020   Vision loss of right eye 08/06/2020   Excessive daytime sleepiness 01/11/2020   Iron deficiency anemia 12/14/2019   Vitreous hemorrhage, right (HCC) 12/04/2019   Diabetic neuropathy (HCC) 07/26/2019   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) 02/03/2019   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   Conductive hearing loss, bilateral 03/03/2018   Chronic bilateral low back pain without sciatica 08/21/2015   Chronic kidney disease (CKD) stage G3b/A3, moderately decreased glomerular filtration rate (GFR) between 30-44 mL/min/1.73 square meter and albuminuria creatinine ratio greater than 300 mg/g (HCC) 04/30/2015   Type 2 diabetes mellitus with diabetic polyneuropathy, with long-term current use of insulin (HCC) 02/15/2015   Advance directive on file 05/17/2014   Refusal of blood transfusions as patient is Jehovah's Witness 05/17/2014   OSA treated with BiPAP 02/23/2014   Coronary artery disease 07/07/2013   Severe obesity (BMI >= 40) (HCC) 05/18/2013   Neuropathy 04/11/2013   Diastolic heart failure (HCC) 10/16/2012   Essential hypertension 10/16/2012      07/09/2022   11:27 AM 07/07/2022   10:58 AM 08/25/2021    8:15 AM 06/03/2021   11:25 AM 05/06/2021    1:21 PM  Depression screen PHQ 2/9  Decreased  Interest 2 2  3 3   Down, Depressed, Hopeless 2 2  3 2   PHQ - 2 Score 4 4  6 5   Altered sleeping 2 2  3 3   Tired, decreased energy 2 2  3 3   Change in appetite 1 2  1 3   Feeling bad or failure about yourself  2 2  3 1   Trouble concentrating 2 2  3 3   Moving slowly or fidgety/restless 0 0  0 1  Suicidal thoughts 0 0  0 0  PHQ-9 Score 13 14  19 19   Difficult doing work/chores Somewhat difficult Not difficult at all  Somewhat difficult Somewhat difficult     Information is confidential and restricted.  Go to Review Flowsheets to unlock data.    Conditions to be addressed/monitored per PCP order:  Anxiety and Depression  Care Plan : General Social Work (Adult)  Updates made by Gustavus Bryant, LCSW since 11/02/2022 12:00 AM     Problem: Anxiety Identification (Anxiety)      Long-Range Goal: Anxiety Symptoms Identified   Start Date: 05/16/2021  Expected End Date: 11/02/2022  Priority: High  Note:    Timeframe:  Long-Range Goal Priority:  High Start Date:    07/09/22                 End Date:   11/02/22   Current barriers:   Chronic Mental Health needs related to depression Mental Health Concerns  and Social Isolation Needs Support, Education, and Care Coordination in order to meet unmet mental health needs.  Clinical Goal(s): demonstrate a reduction in symptoms related to :Depression: loss of energy/fatigue feelings of worthlessness, guilt  verbalize understanding of plan for management of Depression   Clinical Interventions:  Assessed patient's previous and current treatment, coping skills, support system and barriers to care  Depression screen reviewed  Active listening / Reflection utilized  Behavioral Activation reviewed Provided brief CBT  Suicidal Ideation/Homicidal Ideation assessed: Discussed Guardianship and reviewed process  Discussed Health Care Power of Attorney  Discussed referral for psychiatry , patient does not wish to pursue a referral for psychiatry at this  time Made referral to [facility] for counseling on 07/09/22 and email was sent to patient today as well with mental health resources Healthy self-care education was provided during 07/17/22 session due to patient being sick this past week [facility] LCSW 07/17/22 update- Patient reports that she has already received a call from [facility] regarding the therapy referral [facility] LCSW made. She said she missed this call due to being sick and will make sure to contact them back for scheduling soon. Patient was encouraged to drink a lot of fluids to help hydrate her body and she was encouraged to get some rest. 07/29/22 Update- Patient reports that she is playing phone tag with [facility] but they are both trying to reach each other and have not been able to do so but she will return their last call and will notify [facility] LCSW if she continues to have issues. Patient reports that she is working actively on fighting her negative feelings and emotions in order to live her life more positively. LCSW 08/12/22 update- Patient reports that she has been unable to establish contact with [facility] so [facility] LCSW and patient completed joint call to agency and left a message. [facility] returned call to [facility] LCSW and stated that their therapist and has been trying to reach patient to get her scheduled but has been unable to reach her. Patient was called and provided this update and is agreeable to contacting therapist directly given the contact information left in her voice messages. Self-care education provided and an addition email was sent to her as well with resources/suggested self-care tips and tricks. [facility] LCSW 08/19/22 update- Patient reports that she has still been having issues establishing contact with [facility] so [facility] LCSW completed joint phone call with patient to agency and was able to successfully schedule her for in person bi weekly counseling sessions every other Monday at 5 pm. Patient's first  appointment is next Monday on 08/24/22 at 5 pm. Email was sent to patient with appointment reminders and tips/tricks for self-care. Patient has not been successfully established  with a long term therapist at [facility] and will have weekly therapy sessions on Mondays at 9 am. Patient reports having a successful first session with her new therapist. 09/22/22- Patient confirms attending regular weekly sessions with her therapist. She reports that she has been able to build great rapport with him. She declines any concerns. She reports that she is unsure if she needs a psychiatry referral at this time. Patient will dive deep into therapy over the next 30 days and then will consider if she needs to make any adjustments to her current psychiatric medications. 11/02/22- Patient reports doing well mentally and has made great improvement with her grief and depressive symptoms. She was provided great positive reinforcement for the active role she has played in while pursuing mental health treatment. She receives ongoing individual therapy and has built great rapport with this counselor. She is agreeable to [facility] LCSW closing her case but is very aware that [facility] LCSW can jump back into her case at anytime if any social work concerns arise in the future. [facility] team updated as well.   BSW completed a telephone outreach with patient, she stated she is in need of dental resources and PCP resources. Patient stated she received some housing resources awhile ago but all were no longer accepting applications. BSW will email patient a list of resources for dental and housing, and provided patient with getcarenow information.  Review resources, discussed options and provided patient information about  Options for mental health treatment based on need and insurance  Inter-disciplinary care team collaboration (see longitudinal plan of care) Patient Goals/Self-Care Activities: Over the next 120 days REFERRAL PLACED I have placed a  referral for counseling at [facility] and they will contact you.  If you have not heard from them in for counseling at [facility] and  please follow up.  The following coping skill education was provided for stress relief and mental health management: "When your car dies or a deadline looms, how do you respond? Long-term, low-grade or acute stress takes a serious toll on your body and mind, so don't ignore feelings of constant tension. Stress is a natural part of life. However, too much stress can harm our health, especially if it continues every day. This is chronic stress and can put you at risk for heart problems like heart disease and depression. Understand what's happening inside your body and learn simple coping skills to combat the negative impacts of everyday stressors.  Types of Stress There are two types of stress: Emotional - types of emotional stress are relationship problems, pressure at work, financial worries, experiencing discrimination or having a major life change. Physical - Examples of physical stress include being sick having pain, not sleeping well, recovery from an injury or having an alcohol and drug use disorder. Fight or Flight Sudden or ongoing stress activates your nervous system and floods your bloodstream with adrenaline and cortisol, two hormones that raise blood pressure, increase heart rate and spike blood sugar. These changes pitch your body into a fight or flight response. That enabled our ancestors to outrun saber-toothed tigers, and it's helpful today for situations like dodging a car accident. But most modern chronic stressors, such as finances or a challenging relationship, keep your body in that heightened state, which hurts your health. Effects of Too Much Stress If constantly under stress, most of [place] will eventually start to function less well.  Multiple studies link chronic stress to a higher risk of heart disease, stroke, depression,  weight gain,  memory loss and even premature death, so it's important to recognize the warning signals. Talk to your doctor about ways to manage stress if you're experiencing any of these symptoms: Prolonged periods of poor sleep. Regular, severe headaches. Unexplained weight loss or gain. Feelings of isolation, withdrawal or worthlessness. Constant anger and irritability. Loss of interest in activities. Constant worrying or obsessive thinking. Excessive alcohol or drug use. Inability to concentrate.  10 Ways to Cope with Chronic Stress It's key to recognize stressful situations as they occur because it allows you to focus on managing how you react. We all need to know when to close our eyes and take a deep breath when we feel tension rising. Use these tips to prevent or reduce chronic stress. 1. Rebalance Work and Home All work and no play? If you're spending too much time at the office, intentionally put more dates in your calendar to enjoy time for fun, either alone or with others. 2. Get Regular Exercise Moving your body on a regular basis balances the nervous system and increases blood circulation, helping to flush out stress hormones. Even a daily 20-minute walk makes a difference. Any kind of exercise can lower stress and improve your mood ? just pick activities that you enjoy and make it a regular habit. 3. Eat Well and Limit Alcohol and Stimulants Alcohol, nicotine and caffeine may temporarily relieve stress but have negative health impacts and can make stress worse in the long run. Well-nourished bodies cope better, so start with a good breakfast, add more organic fruits and vegetables for a well-balanced diet, avoid processed foods and sugar, try herbal tea and drink more water. 4. Connect with Supportive People Talking face to face with another person releases hormones that reduce stress. Lean on those good listeners in your life. 5. Carve Out Hobby Time Do you enjoy gardening, reading,  listening to music or some other creative pursuit? Engage in activities that bring you pleasure and joy; research shows that reduces stress by almost half and lowers your heart rate, too. 6. Practice Meditation, Stress Reduction or Yoga Relaxation techniques activate a state of restfulness that counterbalances your body's fight-or-flight hormones. Even if this also means a 10-minute break in a long day: listen to music, read, go for a walk in nature, do a hobby, take a bath or spend time with a friend. Also consider doing a mindfulness exercise or try a daily deep breathing or imagery practice. Deep Breathing Slow, calm and deep breathing can help you relax. Try these steps to focus on your breathing and repeat as needed. Find a comfortable position and close your eyes. Exhale and drop your shoulders. Breathe in through your nose; fill your lungs and then your belly. Think of relaxing your body, quieting your mind and becoming calm and peaceful. Breathe out slowly through your nose, relaxing your belly. Think of releasing tension, pain, worries or distress. Repeat steps three and four until you feel relaxed. Imagery This involves using your mind to excite the senses -- sound, vision, smell, taste and feeling. This may help ease your stress. Begin by getting comfortable and then do some slow breathing. Imagine a place you love being at. It could be somewhere from your childhood, somewhere you vacationed or just a place in your imagination. Feel how it is to be in the place you're imagining. Pay attention to the sounds, air, colors, and who is there with you. This is a place where you feel cared for and  loved. All is well. You are safe. Take in all the smells, sounds, tastes and feelings. As you do, feel your body being nourished and healed. Feel the calm that surrounds you. Breathe in all the good. Breathe out any discomfort or tension. 7. Sleep Enough If you get less than seven to eight hours of  sleep, your body won't tolerate stress as well as it could. If stress keeps you up at night, address the cause, and add extra meditation into your day to make up for the lost z's. Try to get seven to nine hours of sleep each night. Make a regular bedtime schedule. Keep your room dark and cool. Try to avoid computers, TV, cell phones and tablets before bed. 8. [place] with Connections You Enjoy Go out for a coffee with a friend, chat with a neighbor, call a family member, visit with a clergy member, or even hang out with your pet. Clinical studies show that spending even a short time with a companion animal can cut anxiety levels almost in half. 9. Take a Vacation Getting away from it all can reset your stress tolerance by increasing your mental and emotional outlook, which makes you a happier, more productive person upon return. Leave your cellphone and laptop at home! 10. See a Counselor, Coach or Therapist If negative thoughts overwhelm your ability to make positive changes, it's time to seek professional help. Make an appointment today--your health and life are worth it."          24- Hour Availability:    [facility]  [address] [place], [place] [phone] Crisis [phone]   [facility] [phone]  [place] [facility]  [phone]    [facility]  [phone] (after hours)   Therapeutic Alternative/Mobile Crisis   [phone]   [place] National Suicide Hotline  [phone] Len Childs) [place] [address]   Call [phone] for mental health emergencies   [facility]  [phone]);  [place] and [employer]  [phone]); [place], [place], [place], [place], [place], [place], [place]    [facility] for [facility] Residents For 24/7 walk-up access to mental health services for [facility] children (4+), adolescents and adults, please visit  the [facility] located at [address] in [place], [place].  *[place] also provides comprehensive outpatient behavioral health services in a variety of locations around the [place].  Connect With [place] [address] [place], [place] [address] HelpLine: [phone] or 1-917-726-6058  Get Directions  Find Help 24/7 By Phone Call our 24-hour HelpLine at (332) 073-3992 or [phone] for immediate assistance for mental health and substance abuse issues.  Walk-In Help [place]: [facility] (Ages 4 and Up) [place]: Emergency Dept., [facility] Additional Resources National Hopeline Network: 1-800-SUICIDE The National Suicide Prevention Lifeline: 1-800-273-TALK     Follow up:  Patient requests no follow-up at this time.  Plan: The Managed Medicaid care management team will reach out to the patient again over the next 30 days. [facility] RNCM and [facility] BSW remain involved and have future appointments with patient this week. Patient was reminded of these as well.  Dickie La, BSW, MSW, [employer] Managed Medicaid LCSW [facility]  [facility] [email or web address] Phone: [phone]

## 2022-11-02 NOTE — Patient Instructions (Signed)
Visit Information  Ms. Luebbe was given information about Medicaid Managed Care team care coordination services as a part of their [facility] Medicaid benefit. Irielle Berglin verbally consented to engagement with the [facility] Managed Care team.   If you are experiencing a medical emergency, please call 911 or report to your local emergency department or urgent care.   If you have a non-emergency medical problem during routine business hours, please contact your provider's office and ask to speak with a nurse.   For questions related to your [facility] health plan, please call: [phone] or go here:[email or web address]  If you would like to schedule transportation through your [facility] plan, please call the following number at least 2 days in advance of your appointment: [phone].   You can also use the MTM portal or MTM mobile app to manage your rides. Reimbursement for transportation is available through [facility]! For the portal, please go to [email or web address]  Call the [facility] Crisis Line at [phone], at any time, 24 hours a day, 7 days a week. If you are in danger or need immediate medical attention call 911.  If you would like help to quit smoking, call 1-800-QUIT-NOW (423-447-7476) OR Espaol: 1-855-Djelo-Ya (4-010-272-5366) o para ms informacin haga clic aqu or Text READY to 440-347 to register via text   Following is a copy of your plan of care:  Care Plan : General Social Work (Adult)  Updates made by Gustavus Bryant, LCSW since 11/02/2022 12:00 AM     Problem: Anxiety Identification (Anxiety)      Long-Range Goal: Anxiety Symptoms Identified   Start Date: 05/16/2021  Expected End Date: 11/02/2022  Priority: High  Note:    Timeframe:  Long-Range Goal Priority:  High Start Date:    07/09/22                 End Date:   11/02/22   Current barriers:   Chronic Mental Health needs related to depression Mental Health Concerns  and  Social Isolation Needs Support, Education, and Care Coordination in order to meet unmet mental health needs.  Clinical Goal(s): demonstrate a reduction in symptoms related to :Depression: loss of energy/fatigue feelings of worthlessness, guilt  verbalize understanding of plan for management of Depression  and Grief  The following coping skill education was provided for stress relief and mental health management: "When your car dies or a deadline looms, how do you respond? Long-term, low-grade or acute stress takes a serious toll on your body and mind, so don't ignore feelings of constant tension. Stress is a natural part of life. However, too much stress can harm our health, especially if it continues every day. This is chronic stress and can put you at risk for heart problems like heart disease and depression. Understand what's happening inside your body and learn simple coping skills to combat the negative impacts of everyday stressors.  Types of Stress There are two types of stress: Emotional - types of emotional stress are relationship problems, pressure at work, financial worries, experiencing discrimination or having a major life change. Physical - Examples of physical stress include being sick having pain, not sleeping well, recovery from an injury or having an alcohol and drug use disorder. Fight or Flight Sudden or ongoing stress activates your nervous system and floods your bloodstream with adrenaline and cortisol, two hormones that raise blood pressure, increase heart rate and spike blood sugar. These changes pitch your body into a fight or flight response. That enabled our ancestors  to outrun saber-toothed tigers, and it's helpful today for situations like dodging a car accident. But most modern chronic stressors, such as finances or a challenging relationship, keep your body in that heightened state, which hurts your health. Effects of Too Much Stress If constantly under stress, most of  [place] will eventually start to function less well.  Multiple studies link chronic stress to a higher risk of heart disease, stroke, depression, weight gain, memory loss and even premature death, so it's important to recognize the warning signals. Talk to your doctor about ways to manage stress if you're experiencing any of these symptoms: Prolonged periods of poor sleep. Regular, severe headaches. Unexplained weight loss or gain. Feelings of isolation, withdrawal or worthlessness. Constant anger and irritability. Loss of interest in activities. Constant worrying or obsessive thinking. Excessive alcohol or drug use. Inability to concentrate.  10 Ways to Cope with Chronic Stress It's key to recognize stressful situations as they occur because it allows you to focus on managing how you react. We all need to know when to close our eyes and take a deep breath when we feel tension rising. Use these tips to prevent or reduce chronic stress. 1. Rebalance Work and Home All work and no play? If you're spending too much time at the office, intentionally put more dates in your calendar to enjoy time for fun, either alone or with others. 2. Get Regular Exercise Moving your body on a regular basis balances the nervous system and increases blood circulation, helping to flush out stress hormones. Even a daily 20-minute walk makes a difference. Any kind of exercise can lower stress and improve your mood ? just pick activities that you enjoy and make it a regular habit. 3. Eat Well and Limit Alcohol and Stimulants Alcohol, nicotine and caffeine may temporarily relieve stress but have negative health impacts and can make stress worse in the long run. Well-nourished bodies cope better, so start with a good breakfast, add more organic fruits and vegetables for a well-balanced diet, avoid processed foods and sugar, try herbal tea and drink more water. 4. Connect with Supportive People Talking face to face with another  person releases hormones that reduce stress. Lean on those good listeners in your life. 5. Carve Out Hobby Time Do you enjoy gardening, reading, listening to music or some other creative pursuit? Engage in activities that bring you pleasure and joy; research shows that reduces stress by almost half and lowers your heart rate, too. 6. Practice Meditation, Stress Reduction or Yoga Relaxation techniques activate a state of restfulness that counterbalances your body's fight-or-flight hormones. Even if this also means a 10-minute break in a long day: listen to music, read, go for a walk in nature, do a hobby, take a bath or spend time with a friend. Also consider doing a mindfulness exercise or try a daily deep breathing or imagery practice. Deep Breathing Slow, calm and deep breathing can help you relax. Try these steps to focus on your breathing and repeat as needed. Find a comfortable position and close your eyes. Exhale and drop your shoulders. Breathe in through your nose; fill your lungs and then your belly. Think of relaxing your body, quieting your mind and becoming calm and peaceful. Breathe out slowly through your nose, relaxing your belly. Think of releasing tension, pain, worries or distress. Repeat steps three and four until you feel relaxed. Imagery This involves using your mind to excite the senses -- sound, vision, smell, taste and feeling. This may  help ease your stress. Begin by getting comfortable and then do some slow breathing. Imagine a place you love being at. It could be somewhere from your childhood, somewhere you vacationed or just a place in your imagination. Feel how it is to be in the place you're imagining. Pay attention to the sounds, air, colors, and who is there with you. This is a place where you feel cared for and loved. All is well. You are safe. Take in all the smells, sounds, tastes and feelings. As you do, feel your body being nourished and healed. Feel the calm  that surrounds you. Breathe in all the good. Breathe out any discomfort or tension. 7. Sleep Enough If you get less than seven to eight hours of sleep, your body won't tolerate stress as well as it could. If stress keeps you up at night, address the cause, and add extra meditation into your day to make up for the lost z's. Try to get seven to nine hours of sleep each night. Make a regular bedtime schedule. Keep your room dark and cool. Try to avoid computers, TV, cell phones and tablets before bed. 8. [place] with Connections You Enjoy Go out for a coffee with a friend, chat with a neighbor, call a family member, visit with a clergy member, or even hang out with your pet. Clinical studies show that spending even a short time with a companion animal can cut anxiety levels almost in half. 9. Take a Vacation Getting away from it all can reset your stress tolerance by increasing your mental and emotional outlook, which makes you a happier, more productive person upon return. Leave your cellphone and laptop at home! 10. See a Counselor, Coach or Therapist If negative thoughts overwhelm your ability to make positive changes, it's time to seek professional help. Make an appointment today--your health and life are worth it."      24- Hour Availability:    [facility]  [address] [place], [place] [phone] Crisis [phone]   [facility] [phone]  [place]  [phone]    [facility]  [phone] (after hours)   Therapeutic Alternative/Mobile Crisis   [phone]   [place] National Suicide Hotline  917-388-9659 Len Childs) [place] [address]   Call (301) 629-8695 for mental health emergencies   [facility]  ([phone]);  [place] and [employer]  [phone]); [place], [place], [place], [place], [place], [place], [place]    [facility] Residents For 24/7 walk-up access to mental health services for [facility] children (4+), adolescents and adults, please visit the [facility] located at [address] in [place], [place].  *[facility] also provides comprehensive outpatient behavioral health services in a variety of locations around the [place].  Connect With [place] [address] [place], [place] [address] HelpLine: [phone] or 1-(430)551-4224  Get Directions  Find Help 24/7 By Phone Call our 24-hour HelpLine at (252)715-8515 or (415)639-2953 for immediate assistance for mental health and substance abuse issues.  Walk-In Help [place]: [facility] (Ages 4 and Up) [place]: Emergency Dept., [facility] Additional Resources National Hopeline Network: 1-800-SUICIDE The National Suicide Prevention Lifeline: (616)652-1305

## 2022-11-05 ENCOUNTER — Encounter: Payer: Self-pay | Admitting: Obstetrics and Gynecology

## 2022-11-05 ENCOUNTER — Other Ambulatory Visit: Payer: Medicaid Other | Admitting: Obstetrics and Gynecology

## 2022-11-05 NOTE — Patient Outreach (Signed)
Medicaid Managed Care   Nurse Care Manager Note  11/05/2022 Name:  Maria Hudson MRN:  [id number] DOB:  01-28-1961  Maria Hudson is an 62 y.o. year old female who is a primary patient of Fleet Contras, MD.  [facility] Managed Care Coordination team was consulted for assistance with:    Chronic healthcare management needs, HTN, retinopathy, CKD, DM, CAD, CHF, OSA, chronic pain, depression, gout  Ms. Head was given information about Medicaid Managed Care Coordination team services today. [name] Patient agreed to services and verbal consent obtained.  [name] for follow up visit in response to provider referral for case management and/or care coordination services.   Assessments/Interventions:  Review of past medical history, allergies, medications, health status, including review of consultants reports, laboratory and other test data, was performed as part of comprehensive evaluation and provision of chronic care management services.  SDOH (Social Determinants of Health) assessments and interventions performed: SDOH Interventions    Flowsheet Row Patient Outreach Telephone from 11/05/2022 in [place] POPULATION HEALTH DEPARTMENT Patient Outreach Telephone from 11/02/2022 in [place] POPULATION HEALTH DEPARTMENT Patient Outreach Telephone from 10/06/2022 in [place] POPULATION HEALTH DEPARTMENT Patient Outreach Telephone from 09/22/2022 in [place] POPULATION HEALTH DEPARTMENT Patient Outreach Telephone from 09/03/2022 in [place] POPULATION HEALTH DEPARTMENT Patient Outreach from 08/20/2022 in [place] POPULATION HEALTH DEPARTMENT  SDOH Interventions        Food Insecurity Interventions Intervention Not Indicated -- -- -- -- --  Utilities Interventions -- -- -- -- Intervention Not Indicated --  Alcohol Usage Interventions Intervention Not Indicated (Score <7) -- -- -- -- --  Financial Strain Interventions -- -- -- -- -- Intervention Not Indicated   Physical Activity Interventions -- -- Other (Comments)  [not phyasically able to enage in a moderate to strenuous exercise routine] -- -- --  Stress Interventions -- [employer], Provide Counseling -- [employer], Provide Counseling -- --     Care Plan  Allergies  Allergen Reactions   Metformin And Related Nausea Only   Other     Clonidine patch causes skin irritation    Hydrocodone Itching    Patient able to tolerate when taken with Benadryl.   Oxycodone-Acetaminophen Itching    Patient able to tolerate when taken with Benadryl.   Medications Reviewed Today     Reviewed by [name], RN (Registered Nurse) on 11/05/22 at 1127  Med List Status: <None>   Medication Order Taking? Sig Documenting Provider Last Dose Status Informant  Accu-Chek Softclix Lancets lancets [id number] No 3 (three) times daily. [provider] Taking Active Self  acetaminophen (TYLENOL) 500 MG tablet [id number] No Take 1,000 mg by mouth every 6 (six) hours as needed for moderate pain or headache. [provider] Taking Active Self  allopurinol (ZYLOPRIM) 100 MG tablet [id number] No Take 100 mg by mouth in the morning. [provider] Taking Active Self  amLODipine (NORVASC) 10 MG tablet [id number] No Take 10 mg by mouth daily. [provider] Taking Active   aspirin EC 81 MG tablet [id number] No Take 81 mg by mouth in the morning. Swallow whole. [provider] Taking Active Self           Med Note [name]   Mon Apr 14, 2021  9:22 PM)    atorvastatin (LIPITOR) 40 MG tablet [id number] No Take 1 tablet (40 mg total) by mouth every evening. Chilton Si, MD Taking Active Self  buPROPion (  WELLBUTRIN) 100 MG tablet [id number] No Take 100 mg by mouth 2 (two) times daily. [provider] Taking Active Self  carvedilol (COREG) 25 MG tablet [id number] No Take 1 tablet (25 mg total) by mouth 2 (two) times  daily with a meal. [name], York Ram, MD Taking Active Self  cholecalciferol (VITAMIN D3) 25 MCG (1000 UNIT) tablet [id number] No Take 2,000 Units by mouth daily. [provider] Taking Active Self  cloNIDine (CATAPRES) 0.3 MG tablet [id number] No Take 0.3 mg by mouth 3 (three) times daily. [provider] Taking Active Self           Med Note Suezanne Cheshire   Tue Mar 03, 2022  9:23 AM)    Continuous Blood Gluc Transmit (DEXCOM G6 TRANSMITTER) MISC [id number] No Use one every 3 month [provider] Taking Active   dapagliflozin propanediol (FARXIGA) 10 MG TABS tablet [id number] No Take 1 tablet (10 mg total) by mouth daily before breakfast. Jacklynn Ganong, FNP Taking Active   ferrous sulfate 325 (65 FE) MG tablet [id number] No Take 325 mg by mouth 2 (two) times daily. [provider] Taking Active Self  fluticasone (FLONASE) 50 MCG/ACT nasal spray [id number] No Place 1 spray into both nostrils daily as needed for allergies or rhinitis. [provider] Taking Active Self  hydrALAZINE (APRESOLINE) 100 MG tablet [id number] No TAKE 1 TABLET BY MOUTH 3 TIMES DAILY. Chilton Si, MD Taking Active   insulin glargine (LANTUS) 100 unit/mL SOPN [id number] No Inject 5 Units into the skin at bedtime. [provider] Taking Active Self           Med Note [name], [name]   Tue Oct 13, 2022  9:23 AM)    isosorbide mononitrate (IMDUR) 120 MG 24 hr tablet [id number] No Take 120 mg by mouth daily. [provider] Taking Active   linaclotide Karlene Einstein) 145 MCG CAPS capsule [id number] No Take 1 capsule (145 mcg total) by mouth daily before breakfast. Zehr, Princella Pellegrini, PA-C Taking Active   loratadine (CLARITIN) 10 MG tablet [id number] No Take 10 mg by mouth in the morning. [provider] Taking Active Self  Multiple Vitamin (MULTIVITAMIN ADULT) TABS [id number] No Take 1 tablet by mouth in the morning. [provider]  Taking Active Self  nitroGLYCERIN (NITROSTAT) 0.4 MG SL tablet [id number] No Place 1 tablet (0.4 mg total) under the tongue every 5 (five) minutes as needed for chest pain. [name], York Ram, MD Taking Active Self           Med Note Kittie Plater, Smitty Cords   Tue Aug 19, 2021  9:35 AM)    Rubbie Battiest FLEXPEN 100 UNIT/ML FlexPen [id number] No Inject 3 Units into the skin 3 (three) times daily with meals. [provider] Taking Active Self           Med Note [name], [name]   Tue Oct 13, 2022  9:23 AM)    Omega-3 Fatty Acids (FISH OIL) 1200 MG CAPS [id number] No Take 1,200 mg by mouth in the morning. [provider] Taking Active Self  Polyethylene Glycol 3350 (MIRALAX PO) [id number] No Take by mouth daily as needed. [provider] Taking Active   pregabalin (LYRICA) 25 MG capsule [id number] No Take 25 mg by mouth 2 (two) times daily. [provider] Taking Active Self  Semaglutide, 1 MG/DOSE, (OZEMPIC, 1 MG/DOSE,) 4 MG/3ML SOPN [id number] No Inject 1 mg into the skin once a week. Jodelle Red, MD Taking Active  sertraline (ZOLOFT) 50 MG tablet [id number] No Take 50 mg by mouth in the morning. [provider] Taking Active Self  spironolactone (ALDACTONE) 50 MG tablet [id number] No Take 50 mg by mouth daily. [provider] Taking Active   torsemide (DEMADEX) 20 MG tablet [id number] No Take 1 tablet (20 mg total) by mouth daily as needed. Cordova, Anderson Malta, FNP Taking Active   traZODone (DESYREL) 50 MG tablet [id number] No Take 50 mg by mouth at bedtime. [provider] Taking Active Self  TURMERIC CURCUMIN PO [id number] No Take 500 mg by mouth daily at 6 (six) AM. [provider] Taking Active            Patient Active Problem List   Diagnosis Date Noted   Chronic constipation 03/31/2022   Pain due to onychomycosis of toenails of both feet 12/05/2021   Pure hypercholesterolemia 05/27/2021   Chronic  diastolic CHF (congestive heart failure) (HCC) 04/29/2021   Hypertensive urgency 04/14/2021   Acute on chronic diastolic CHF (congestive heart failure) (HCC) 04/14/2021   Chest pain 03/05/2021   CAD (coronary artery disease) 03/05/2021   T2DM (type 2 diabetes mellitus) (HCC) 03/05/2021   CKD (chronic kidney disease) stage 4, GFR 15-29 ml/min (HCC) 03/05/2021   Class 3 obesity (HCC) 03/05/2021   CVA (cerebral vascular accident) (HCC) 03/05/2021   Acute on chronic diastolic heart failure (HCC) 03/05/2021   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   AKI (acute kidney injury) (HCC) 03/05/2021   Vitreous hemorrhage of left eye (HCC) 11/14/2020   Orthopnea 08/06/2020   Vision loss of right eye 08/06/2020   Excessive daytime sleepiness 01/11/2020   Iron deficiency anemia 12/14/2019   Vitreous hemorrhage, right (HCC) 12/04/2019   Diabetic neuropathy (HCC) 07/26/2019   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) 02/03/2019   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   Conductive hearing loss, bilateral 03/03/2018   Chronic bilateral low back pain without sciatica 08/21/2015   Chronic kidney disease (CKD) stage G3b/A3, moderately decreased glomerular filtration rate (GFR) between 30-44 mL/min/1.73 square meter and albuminuria creatinine ratio greater than 300 mg/g (HCC) 04/30/2015   Type 2 diabetes mellitus with diabetic polyneuropathy, with long-term current use of insulin (HCC) 02/15/2015   Advance directive on file 05/17/2014   Refusal of blood transfusions as patient is Jehovah's Witness 05/17/2014   OSA treated with BiPAP 02/23/2014   Coronary artery disease 07/07/2013   Severe obesity (BMI >= 40) (HCC) 05/18/2013   Neuropathy 04/11/2013   Diastolic heart failure (HCC) 10/16/2012   Essential hypertension 10/16/2012   Conditions to be addressed/monitored per PCP order:  Chronic healthcare management needs, HTN, retinopathy, CKD, DM,  CAD, CHF, OSA, chronic pain, depression, gout  Care Plan : RN Care Manager Plan Of Care  Updates made by [name], RN since 11/05/2022 12:00 AM     Problem: Knowledge Deficit and Care Coordination Needs Related to Management of HTN, HF, Type 2 DM   Priority: High     Long-Range Goal: Development of Plan of Care to Address Knowledge Deficits and Care Coordination Needs related to  management of CHF, CAD, HTN, HLD, DMII, CKD Stage 4, and Depression   Start Date: 05/06/2021  Expected End Date: 02/05/2023  Priority: High  Note:   Current Barriers:  Knowledge Deficits related to plan of care for management of CHF, CAD, HTN, HLD, DMII, CKD Stage 4, and Depression 11/05/22:  patient with no complaints today-fistula site improved-no pain.  Patient has received dental, PCP, and housing resources.  ECHO WNL.  Patient checking BP occasionally with occasional chest pain-no change.  BP 152/76 today.  Blood sugar 92-130.  [facility] every Monday.  RNCM Clinical Goal(s):  Patient will demonstrate ongoing adherence to prescribed treatment plan for CHF, CAD, HTN, HLD, DMII, CKD Stage 4, and Depression as evidenced by patient report of improved health and quality of life and no unplanned ED or unplanned hospital admissions continue to work with RN Care Manager and/or Social Worker to address care management and care coordination needs related to CHF, CAD, HTN, HLD, DMII, CKD Stage 4, and Depression as evidenced by adherence to CM Team Scheduled appointments      Work with [facility] LCSW and psychiatrist to treat depression Patient to schedule  an appt with ENDO-scheduled  for May  Interventions: Inter-disciplinary care team collaboration (see longitudinal plan of care) Evaluation of current treatment plan related to  self management and patient's adherence to plan as established by provider Mailed summary of benefits for her [facility] Managed Medicaid plan to her home address Collaborated with Care  Guide for dental resources-completed. Care Guide referral for dental resources Collaborated with BSW for assistance with [facility] appt.-completed. BSW referral for appt assistance. Collaborated with Pharmacy regarding  uncontrolled HTN Pharmacy referral for uncontrolled HTN-completed. Collaborated with LCSW for depression LCSW referral for depression-completed BSW referral for dental and PCP resources-completed. Collaborated with BSW  CAD Interventions: (Status:  New goal.) Long Term Goal Assessed understanding of CAD diagnosis Medications reviewed including medications utilized in CAD treatment plan Provided education on importance of blood pressure control in management of CAD Counseled on importance of regular laboratory monitoring as prescribed Reviewed Importance of taking all medications as prescribed Reviewed Importance of attending all scheduled provider appointments Advised to report any changes in symptoms or exercise tolerance Assessed social determinant of health barriers  Hyperlipidemia Interventions:  (Status:  New goal.) Long Term Goal Medication review performed; medication list updated in electronic medical record.  Provider established cholesterol goals reviewed Counseled on importance of regular laboratory monitoring as prescribed Reviewed importance of limiting foods high in cholesterol Assessed social determinant of health barriers     Heart Failure Interventions:  (Status: Goal on Track (progressing): YES.)  Long Term Goal - patient states her fluid retention has significantly improved,  says she cannot tolerate the compression stockings because they are too tight and make her legs hurt Assessed patient's heart failure management strategies  Diabetes:  (Status: Goal on Track (progressing): YES.) Long Term Goal   Lab Results  Component Value Date   HGBA1C 6.7 (H) 04/29/2021   Hgn A1C=6.3 on 12/26/21 Hgb A1C=5.2 in October per patient HGBA1C=5.8 in March  per patient  Assessed patient's understanding of A1C goal: <7% Reviewed medications with patient and discussed importance of medication adherence;        Counseled on importance of regular laboratory monitoring as prescribed;        Discussed plans with patient for ongoing care management follow up and provided patient with direct contact information for care management team;      Advised patient, providing education and rationale, to check cbg three times daily and record        call provider for findings outside established parameters;       Review of patient status, including review of consultants reports, relevant laboratory and other test results, and medications completed;       Discussed addition of Ozempic to medication regimen ,  reviewed mechanism of action and proven cardiovascular benefits To schedule ENDO appt-discussed nutrition referral, [facility], [facility]-ENDO appt scheduled-patient declines referral.  Hypertension and CKD stage 4 : (Status: Goal on Track (progressing): YES.) Long Term Goal-  has been placed on kidney transplant waiting list, fistu;a placed 12/09/21,ready for use 03/11/22. Last practice recorded BP readings:  BP 04/01/22-145/87 BP 05/01/22-141/70 BP 08/04/22-155/70 BP 11/05/22-152/76  BP Readings from Last 3 Encounters:  09/30/21 120/70  09/12/21 (!) 107/58  09/08/21 (!) 144/71     Most recent eGFR/CrCl:    No components found for: CRCL Lab Results  Component Value Date   NA 136 08/19/2021   K 3.9 08/19/2021   CREATININE 2.51 (H) 08/19/2021   EGFR 19 (L) 03/13/2021   GFRNONAA 22 (L) 08/19/2021   GLUCOSE 122 (H) 08/19/2021    Evaluation of current treatment plan related to hypertension self management and patient's adherence to plan as established by provider;   Reviewed medications with patient and discussed importance of compliance;  Discussed plans with patient for ongoing care management follow up and provided patient with direct  contact information for care management team; Advised patient, providing education and rationale, to monitor blood pressure daily and record, calling PCP for findings outside established parameters;  Reviewed scheduled/upcoming provider appointments  Patient with labile blood pressures, medication adjusted, patient to  f/u with nephrologist-appt in March, also PCP-completed-continues to follow with providers  Weight Loss Interventions:  (Status:  New goal.) Long Term Goal--patient has started Ozempic Discussed indications, mechanism of action, common side effects and CV benefits  of GLP1 Ozempic Assessed tolerance of Ozempic 08/04/22-patient states she has gained 7 pounds this month because she is not eating well-discussed nutrition referral, exercise, and [facility]-patient states she will check on these. 09/03/22: referrals declined  Interdisciplinary Collaboration:  (Status: Goal on Track (progressing): YES.) Long Term Goal  Collaborated with BSW to initiate plan of care to address needs related to Housing barriers, Inability to perform ADL's independently, and Inability to perform IADL's independently in patient with CHF, CAD, HTN, HLD, DM, CKD Stage 4, and Depression Assessed status of patient's involvement with behavioral health counselor Collaboration with pharmacist for medication review, discussion of ? Medication side effects causing chronic constipation and arranging for patient to received home delivery of medications in blister packs  Patient Goals/Self-Care Activities: Take medications as prescribed   Attend all scheduled provider appointments Call pharmacy for medication refills 3-7 days in advance of running out of medications Attend church or other social activities Perform all self care activities independently  Call provider office for new concerns or questions  Work with the social worker to address care coordination needs and will continue to work with the  clinical team to address health care and disease management related needs call office if I gain more than 2 pounds in one day or 5 pounds in one week keep legs up while sitting track weight in diary use salt in moderation watch for swelling in feet, ankles and legs every day weigh myself daily know when to call the doctorfor fluid weight gain or consistently abnormal BP readings at home track symptoms and what helps feel better or worse check blood sugar at prescribed times: three times daily enter blood sugar readings and medication or insulin into daily log take the blood sugar log to all doctor visits take the blood sugar meter to all doctor visits eat fish at least once per week fill half of plate with vegetables  manage portion size check blood pressure daily write blood pressure results in a log or diary learn about high blood pressure take blood pressure log to all doctor appointments call doctor for signs and symptoms of high blood pressure keep all doctor appointments take medications for blood pressure exactly as prescribed Limit fluid intake to 1 liter per day Collaborate with managed Medicaid team of BSW and pharmacist to address identified needs Work with cardiology pharmacist to assist with BP stabilization Work with [facility] LCSW and psychiatrist to treat depression Review summary of [employer] benefits mailed to home address and call to enroll in program that will benefit your chronic health issues Continue to use [facility] Care Management calendar to record weight , BP and CBGs so readings are all in one place Schedule dental appt Schedule ENDO appt-completed.   Long-Range Goal: Establish Plan of Care for Chronic Disease Management Needs   Priority: High  Note:   Timeframe:  Long-Range Goal Priority:  High Start Date:     11/12/21                        Expected End Date:  ongoing                     Follow Up Date: 12/09/22   - schedule appointment  for vaccines needed due to my age or health - schedule recommended health tests (blood work, mammogram, colonoscopy, pap test) - schedule and keep appointment for annual check-up    Why is this important?   Screening tests can find diseases early when they are easier to treat.  Your doctor or nurse will talk with you about which tests are important for you.  Getting shots for common diseases like the flu and shingles will help prevent them.  11/05/22: Upcoming GI and ENDO appts, [facility] appt every Monday   Follow Up:  Patient agrees to Care Plan and Follow-up.  Plan: The Managed Medicaid care management team will reach out to the patient again over the next 30 business  days. and The  Patient has been provided with contact information for the Managed Medicaid care management team and has been advised to call with any health related questions or concerns.  Date/time of next scheduled RN care management/care coordination outreach: 12/09/22 at 1030

## 2022-11-05 NOTE — Patient Instructions (Signed)
Visit Information  Maria Hudson was given information about Medicaid Managed Care team care coordination services as a part of their [facility] Medicaid benefit. [name] verbally consented to engagement with the [facility] Managed Care team.   If you are experiencing a medical emergency, please call 911 or report to your local emergency department or urgent care.   If you have a non-emergency medical problem during routine business hours, please contact your provider's office and ask to speak with a nurse.   For questions related to your [facility] health plan, please call: [phone] or go here:[email or web address]  If you would like to schedule transportation through your [facility] plan, please call the following number at least 2 days in advance of your appointment: [phone].   You can also use the MTM portal or MTM mobile app to manage your rides. Reimbursement for transportation is available through [facility]! For the portal, please go to [email or web address]  Call the [facility] Crisis Line at [phone], at any time, 24 hours a day, 7 days a week. If you are in danger or need immediate medical attention call 911.  If you would like help to quit smoking, call 1-800-QUIT-NOW ((919) 877-1908) OR Espaol: 1-855-Djelo-Ya (4-132-440-1027) o para ms informacin haga clic aqu or Text READY to 253-664 to register via text  [name] - following are the goals we discussed in your visit today:   Goals Addressed    Timeframe:  Long-Range Goal Priority:  High Start Date:     11/12/21                        Expected End Date:  ongoing                     Follow Up Date: 12/09/22   - schedule appointment for vaccines needed due to my age or health - schedule recommended health tests (blood work, mammogram, colonoscopy, pap test) - schedule and keep appointment for annual check-up    Why is this important?   Screening tests can find diseases early when they are  easier to treat.  Your doctor or nurse will talk with you about which tests are important for you.  Getting shots for common diseases like the flu and shingles will help prevent them.  11/05/22: Upcoming GI and ENDO appts, [facility] appt every Monday  Patient verbalizes understanding of instructions and care plan provided today and agrees to view in MyChart. Active MyChart status and patient understanding of how to access instructions and care plan via MyChart confirmed with patient.     The Managed Medicaid care management team will reach out to the patient again over the next 30 business  days.  The  Patient has been provided with contact information for the Managed Medicaid care management team and has been advised to call with any health related questions or concerns.   Kathi Der RN, BSN [place]  [facility] Care Management Coordinator - Managed Medicaid High Risk [phone]   Following is a copy of your plan of care:  Care Plan : RN Care Manager Plan Of Care  Updates made by [name], RN since 11/05/2022 12:00 AM     Problem: Knowledge Deficit and Care Coordination Needs Related to Management of HTN, HF, Type 2 DM   Priority: High     Long-Range Goal: Development of Plan of Care to Address Knowledge Deficits and Care Coordination Needs related to  management of  CHF, CAD, HTN, HLD, DMII, CKD Stage 4, and Depression   Start Date: 05/06/2021  Expected End Date: 02/05/2023  Priority: High  Note:   Current Barriers:  Knowledge Deficits related to plan of care for management of CHF, CAD, HTN, HLD, DMII, CKD Stage 4, and Depression 11/05/22:  patient with no complaints today-fistula site improved-no pain.  Patient has received dental, PCP, and housing resources.  ECHO WNL.  Patient checking BP occasionally with occasional chest pain-no change.  BP 152/76 today.  Blood sugar 92-130.  [facility] every Monday.  RNCM Clinical Goal(s):  Patient will  demonstrate ongoing adherence to prescribed treatment plan for CHF, CAD, HTN, HLD, DMII, CKD Stage 4, and Depression as evidenced by patient report of improved health and quality of life and no unplanned ED or unplanned hospital admissions continue to work with RN Care Manager and/or Social Worker to address care management and care coordination needs related to CHF, CAD, HTN, HLD, DMII, CKD Stage 4, and Depression as evidenced by adherence to CM Team Scheduled appointments      Work with [facility] LCSW and psychiatrist to treat depression Patient to schedule  an appt with ENDO-scheduled  for May  Interventions: Inter-disciplinary care team collaboration (see longitudinal plan of care) Evaluation of current treatment plan related to  self management and patient's adherence to plan as established by provider Mailed summary of benefits for her [name] Managed Medicaid plan to her home address Collaborated with Care Guide for dental resources-completed. Care Guide referral for dental resources Collaborated with BSW for assistance with [facility] appt.-completed. BSW referral for appt assistance. Collaborated with Pharmacy regarding  uncontrolled HTN Pharmacy referral for uncontrolled HTN-completed. Collaborated with LCSW for depression LCSW referral for depression-completed BSW referral for dental and PCP resources-completed. Collaborated with BSW  CAD Interventions: (Status:  New goal.) Long Term Goal Assessed understanding of CAD diagnosis Medications reviewed including medications utilized in CAD treatment plan Provided education on importance of blood pressure control in management of CAD Counseled on importance of regular laboratory monitoring as prescribed Reviewed Importance of taking all medications as prescribed Reviewed Importance of attending all scheduled provider appointments Advised to report any changes in symptoms or exercise tolerance Assessed social determinant of health  barriers  Hyperlipidemia Interventions:  (Status:  New goal.) Long Term Goal Medication review performed; medication list updated in electronic medical record.  Provider established cholesterol goals reviewed Counseled on importance of regular laboratory monitoring as prescribed Reviewed importance of limiting foods high in cholesterol Assessed social determinant of health barriers     Heart Failure Interventions:  (Status: Goal on Track (progressing): YES.)  Long Term Goal - patient states her fluid retention has significantly improved,  says she cannot tolerate the compression stockings because they are too tight and make her legs hurt Assessed patient's heart failure management strategies  Diabetes:  (Status: Goal on Track (progressing): YES.) Long Term Goal   Lab Results  Component Value Date   HGBA1C 6.7 (H) 04/29/2021   Hgn A1C=6.3 on 12/26/21 Hgb A1C=5.2 in October per patient HGBA1C=5.8 in March per patient  Assessed patient's understanding of A1C goal: <7% Reviewed medications with patient and discussed importance of medication adherence;        Counseled on importance of regular laboratory monitoring as prescribed;        Discussed plans with patient for ongoing care management follow up and provided patient with direct contact information for care management team;      Advised patient, providing education  and rationale, to check cbg three times daily and record        call provider for findings outside established parameters;       Review of patient status, including review of consultants reports, relevant laboratory and other test results, and medications completed;       Discussed addition of Ozempic to medication regimen , reviewed mechanism of action and proven cardiovascular benefits To schedule ENDO appt-discussed nutrition referral, [facility], [facility]-ENDO appt scheduled-patient declines referral.  Hypertension and CKD stage 4 : (Status: Goal on  Track (progressing): YES.) Long Term Goal-  has been placed on kidney transplant waiting list, fistu;a placed 12/09/21,ready for use 03/11/22. Last practice recorded BP readings:  BP 04/01/22-145/87 BP 05/01/22-141/70 BP 08/04/22-155/70 BP 11/05/22-152/76  BP Readings from Last 3 Encounters:  09/30/21 120/70  09/12/21 (!) 107/58  09/08/21 (!) 144/71     Most recent eGFR/CrCl:    No components found for: CRCL Lab Results  Component Value Date   NA 136 08/19/2021   K 3.9 08/19/2021   CREATININE 2.51 (H) 08/19/2021   EGFR 19 (L) 03/13/2021   GFRNONAA 22 (L) 08/19/2021   GLUCOSE 122 (H) 08/19/2021    Evaluation of current treatment plan related to hypertension self management and patient's adherence to plan as established by provider;   Reviewed medications with patient and discussed importance of compliance;  Discussed plans with patient for ongoing care management follow up and provided patient with direct contact information for care management team; Advised patient, providing education and rationale, to monitor blood pressure daily and record, calling PCP for findings outside established parameters;  Reviewed scheduled/upcoming provider appointments  Patient with labile blood pressures, medication adjusted, patient to  f/u with nephrologist-appt in March, also PCP-completed-continues to follow with providers  Weight Loss Interventions:  (Status:  New goal.) Long Term Goal--patient has started Ozempic Discussed indications, mechanism of action, common side effects and CV benefits  of GLP1 Ozempic Assessed tolerance of Ozempic 08/04/22-patient states she has gained 7 pounds this month because she is not eating well-discussed nutrition referral, exercise, and [facility]-patient states she will check on these. 09/03/22: referrals declined  Interdisciplinary Collaboration:  (Status: Goal on Track (progressing): YES.) Long Term Goal  Collaborated with BSW to initiate plan  of care to address needs related to Housing barriers, Inability to perform ADL's independently, and Inability to perform IADL's independently in patient with CHF, CAD, HTN, HLD, DM, CKD Stage 4, and Depression Assessed status of patient's involvement with behavioral health counselor Collaboration with pharmacist for medication review, discussion of ? Medication side effects causing chronic constipation and arranging for patient to received home delivery of medications in blister packs  Patient Goals/Self-Care Activities: Take medications as prescribed   Attend all scheduled provider appointments Call pharmacy for medication refills 3-7 days in advance of running out of medications Attend church or other social activities Perform all self care activities independently  Call provider office for new concerns or questions  Work with the social worker to address care coordination needs and will continue to work with the clinical team to address health care and disease management related needs call office if I gain more than 2 pounds in one day or 5 pounds in one week keep legs up while sitting track weight in diary use salt in moderation watch for swelling in feet, ankles and legs every day weigh myself daily know when to call the doctorfor fluid weight gain or consistently abnormal BP readings  at home track symptoms and what helps feel better or worse check blood sugar at prescribed times: three times daily enter blood sugar readings and medication or insulin into daily log take the blood sugar log to all doctor visits take the blood sugar meter to all doctor visits eat fish at least once per week fill half of plate with vegetables manage portion size check blood pressure daily write blood pressure results in a log or diary learn about high blood pressure take blood pressure log to all doctor appointments call doctor for signs and symptoms of high blood pressure keep all doctor  appointments take medications for blood pressure exactly as prescribed Limit fluid intake to 1 liter per day Collaborate with managed Medicaid team of BSW and pharmacist to address identified needs Work with cardiology pharmacist to assist with BP stabilization Work with [facility] LCSW and psychiatrist to treat depression Review summary of [employer] benefits mailed to home address and call to enroll in program that will benefit your chronic health issues Continue to use [facility] Care Management calendar to record weight , BP and CBGs so readings are all in one place Schedule dental appt Schedule ENDO appt-completed.

## 2022-11-06 ENCOUNTER — Other Ambulatory Visit: Payer: Medicaid Other

## 2022-11-06 NOTE — Patient Instructions (Signed)
  Medicaid Managed Care   Unsuccessful Outreach Note  11/06/2022 Name: Maria Hudson MRN: [id number] DOB: 19-Oct-1960  Referred by: Fleet Contras, MD Reason for referral : High Risk Managed Medicaid (MM Social work telephone outreach )   An unsuccessful telephone outreach was attempted today. The patient was referred to the case management team for assistance with care management and care coordination.   Follow Up Plan: A HIPAA compliant phone message was left for the patient providing contact information and requesting a return call.   Abelino Derrick, MHA [facility]  Managed [facility] Social Worker [phone]

## 2022-11-06 NOTE — Patient Outreach (Signed)
  Medicaid Managed Care   Unsuccessful Outreach Note  11/06/2022 Name: Maria Hudson MRN: [id number] DOB: 06/21/1960  Referred by: Avbuere, Edwin, MD Reason for referral : High Risk Managed Medicaid (MM Social work telephone outreach )   An unsuccessful telephone outreach was attempted today. The patient was referred to the case management team for assistance with care management and care coordination.   Follow Up Plan: A HIPAA compliant phone message was left for the patient providing contact information and requesting a return call.   Daily Doe, BSW, MHA [place]  Managed Medicaid Social Worker [phone]  

## 2022-11-09 ENCOUNTER — Other Ambulatory Visit (HOSPITAL_COMMUNITY): Payer: Self-pay

## 2022-11-10 ENCOUNTER — Other Ambulatory Visit (HOSPITAL_COMMUNITY): Payer: Self-pay

## 2022-11-11 ENCOUNTER — Telehealth: Payer: Self-pay | Admitting: Cardiology

## 2022-11-11 NOTE — Telephone Encounter (Signed)
Pt c/o medication issue:  1. Name of Medication:  Semaglutide, 1 MG/DOSE, (OZEMPIC, 1 MG/DOSE,) 4 MG/3ML SOPN   2. How are you currently taking this medication (dosage and times per day)?   3. Are you having a reaction (difficulty breathing--STAT)?   4. What is your medication issue?   Patient states Dr. Shirlee Latch recommends increasing patient's Ozempic dose. Please advise.

## 2022-11-11 NOTE — Telephone Encounter (Signed)
Okay to go up to next dose?

## 2022-11-12 MED ORDER — OZEMPIC (2 MG/DOSE) 8 MG/3ML ~~LOC~~ SOPN: 2.0000 mg | PEN_INJECTOR | SUBCUTANEOUS | 6 refills | Status: DC

## 2022-11-12 NOTE — Telephone Encounter (Signed)
Okay to send in next dose per Laural Golden, rx to pharm on file.

## 2022-11-12 NOTE — Addendum Note (Signed)
Addended by: Marlene Lard on: 11/12/2022 08:24 AM   Modules accepted: Orders

## 2022-11-12 NOTE — Telephone Encounter (Signed)
Yes, ok to increase to 2.0mg  once weekly

## 2022-11-14 DIAGNOSIS — Z419 Encounter for procedure for purposes other than remedying health state, unspecified: Secondary | ICD-10-CM | POA: Diagnosis not present

## 2022-11-16 DIAGNOSIS — N2581 Secondary hyperparathyroidism of renal origin: Secondary | ICD-10-CM | POA: Diagnosis not present

## 2022-11-16 DIAGNOSIS — N184 Chronic kidney disease, stage 4 (severe): Secondary | ICD-10-CM | POA: Diagnosis not present

## 2022-11-16 DIAGNOSIS — I129 Hypertensive chronic kidney disease with stage 1 through stage 4 chronic kidney disease, or unspecified chronic kidney disease: Secondary | ICD-10-CM | POA: Diagnosis not present

## 2022-11-16 DIAGNOSIS — I5032 Chronic diastolic (congestive) heart failure: Secondary | ICD-10-CM | POA: Diagnosis not present

## 2022-11-16 DIAGNOSIS — E1122 Type 2 diabetes mellitus with diabetic chronic kidney disease: Secondary | ICD-10-CM | POA: Diagnosis not present

## 2022-11-16 DIAGNOSIS — F4321 Adjustment disorder with depressed mood: Secondary | ICD-10-CM | POA: Diagnosis not present

## 2022-11-25 DIAGNOSIS — E113591 Type 2 diabetes mellitus with proliferative diabetic retinopathy without macular edema, right eye: Secondary | ICD-10-CM | POA: Diagnosis not present

## 2022-11-25 DIAGNOSIS — E113512 Type 2 diabetes mellitus with proliferative diabetic retinopathy with macular edema, left eye: Secondary | ICD-10-CM | POA: Diagnosis not present

## 2022-11-25 DIAGNOSIS — H4311 Vitreous hemorrhage, right eye: Secondary | ICD-10-CM | POA: Diagnosis not present

## 2022-11-30 DIAGNOSIS — E162 Hypoglycemia, unspecified: Secondary | ICD-10-CM | POA: Diagnosis not present

## 2022-11-30 DIAGNOSIS — E1121 Type 2 diabetes mellitus with diabetic nephropathy: Secondary | ICD-10-CM | POA: Diagnosis not present

## 2022-11-30 DIAGNOSIS — E7800 Pure hypercholesterolemia, unspecified: Secondary | ICD-10-CM | POA: Diagnosis not present

## 2022-11-30 DIAGNOSIS — Z794 Long term (current) use of insulin: Secondary | ICD-10-CM | POA: Diagnosis not present

## 2022-12-07 DIAGNOSIS — I10 Essential (primary) hypertension: Secondary | ICD-10-CM | POA: Diagnosis not present

## 2022-12-07 DIAGNOSIS — E1122 Type 2 diabetes mellitus with diabetic chronic kidney disease: Secondary | ICD-10-CM | POA: Diagnosis not present

## 2022-12-07 DIAGNOSIS — M19012 Primary osteoarthritis, left shoulder: Secondary | ICD-10-CM | POA: Diagnosis not present

## 2022-12-07 DIAGNOSIS — H9193 Unspecified hearing loss, bilateral: Secondary | ICD-10-CM | POA: Diagnosis not present

## 2022-12-09 ENCOUNTER — Other Ambulatory Visit: Payer: Medicaid Other | Admitting: Obstetrics and Gynecology

## 2022-12-09 ENCOUNTER — Encounter: Payer: Self-pay | Admitting: Obstetrics and Gynecology

## 2022-12-09 NOTE — Patient Instructions (Signed)
Hi Ms. Rodwell, always a pleasure to speak with you, have a great day!  Ms. Pellicano was given information about Medicaid Managed Care team care coordination services as a part of their [facility] Medicaid benefit. Alondra Vandeven verbally consented to engagement with the [facility] Managed Care team.   If you are experiencing a medical emergency, please call 911 or report to your local emergency department or urgent care.   If you have a non-emergency medical problem during routine business hours, please contact your provider's office and ask to speak with a nurse.   For questions related to your [facility] health plan, please call: [phone] or go here:[email or web address]  If you would like to schedule transportation through your [facility] plan, please call the following number at least 2 days in advance of your appointment: [phone].   You can also use the MTM portal or MTM mobile app to manage your rides. Reimbursement for transportation is available through [facility]! For the portal, please go to [email or web address]  Call the [facility] Crisis Line at [phone], at any time, 24 hours a day, 7 days a week. If you are in danger or need immediate medical attention call 911.  If you would like help to quit smoking, call 1-800-QUIT-NOW (804-413-5166) OR Espaol: 1-855-Djelo-Ya (9-563-875-6433) o para ms informacin haga clic aqu or Text READY to 295-188 to register via text  Ms. Choe - following are the goals we discussed in your visit today:   Goals Addressed    Timeframe:  Long-Range Goal Priority:  High Start Date:     11/12/21                        Expected End Date:  ongoing                     Follow Up Date: 01/08/23   - schedule appointment for vaccines needed due to my age or health - schedule recommended health tests (blood work, mammogram, colonoscopy, pap test) - schedule and keep appointment for annual check-up    Why is this important?    Screening tests can find diseases early when they are easier to treat.  Your doctor or nurse will talk with you about which tests are important for you.  Getting shots for common diseases like the flu and shingles will help prevent them.  12/09/22:  patient with recent PCP/ENDO appt.  Awaiting ORTHO and ENT referrals.  Has upcoming CARDS/GI appt.  Weekly [facility] virtual appts continue  Patient verbalizes understanding of instructions and care plan provided today and agrees to view in MyChart. Active MyChart status and patient understanding of how to access instructions and care plan via MyChart confirmed with patient.     The Managed Medicaid care management team will reach out to the patient again over the next 30 business  days.  The  Patient has been provided with contact information for the Managed Medicaid care management team and has been advised to call with any health related concerns.   Kathi Der RN, BSN [place]  [facility] Care Management Coordinator - Managed Medicaid High Risk [phone]    Following is a copy of your plan of care:  Care Plan : RN Care Manager Plan Of Care  Updates made by [name], RN since 12/09/2022 12:00 AM     Problem: Knowledge Deficit and Care Coordination Needs Related to Management of HTN, HF, Type 2 DM   Priority:  High     Long-Range Goal: Development of Plan of Care to Address Knowledge Deficits and Care Coordination Needs related to  management of CHF, CAD, HTN, HLD, DMII, CKD Stage 4, and Depression   Start Date: 05/06/2021  Expected End Date: 02/05/2023  Priority: High  Note:   Current Barriers:  Knowledge Deficits related to plan of care for management of CHF, CAD, HTN, HLD, DMII, CKD Stage 4, and Depression 12/09/22:  No specific complaints today.  Up to date on all appts.  Awaiting ORTHO and ENT referrals.  BP and BG stable-95-113. CP unchanged.  [facility] appts continue.    RNCM Clinical  Goal(s):  Patient will demonstrate ongoing adherence to prescribed treatment plan for CHF, CAD, HTN, HLD, DMII, CKD Stage 4, and Depression as evidenced by patient report of improved health and quality of life and no unplanned ED or unplanned hospital admissions continue to work with RN Care Manager and/or Social Worker to address care management and care coordination needs related to CHF, CAD, HTN, HLD, DMII, CKD Stage 4, and Depression as evidenced by adherence to CM Team Scheduled appointments      Work with [facility] LCSW and psychiatrist to treat depression Patient to schedule  an appt with ENDO-scheduled  for May  Interventions: Inter-disciplinary care team collaboration (see longitudinal plan of care) Evaluation of current treatment plan related to  self management and patient's adherence to plan as established by provider Mailed summary of benefits for her [facility] Managed Medicaid plan to her home address Collaborated with Care Guide for dental resources-completed. Care Guide referral for dental resources Collaborated with BSW for assistance with [facility] appt.-completed. BSW referral for appt assistance. Collaborated with Pharmacy regarding  uncontrolled HTN Pharmacy referral for uncontrolled HTN-completed. Collaborated with LCSW for depression LCSW referral for depression-completed BSW referral for dental and PCP resources-completed. Collaborated with BSW  CAD Interventions: (Status:  New goal.) Long Term Goal Assessed understanding of CAD diagnosis Medications reviewed including medications utilized in CAD treatment plan Provided education on importance of blood pressure control in management of CAD Counseled on importance of regular laboratory monitoring as prescribed Reviewed Importance of taking all medications as prescribed Reviewed Importance of attending all scheduled provider appointments Advised to report any changes in symptoms or exercise tolerance Assessed social  determinant of health barriers  Hyperlipidemia Interventions:  (Status:  New goal.) Long Term Goal Medication review performed; medication list updated in electronic medical record.  Provider established cholesterol goals reviewed Counseled on importance of regular laboratory monitoring as prescribed Reviewed importance of limiting foods high in cholesterol Assessed social determinant of health barriers     Heart Failure Interventions:  (Status: Goal on Track (progressing): YES.)  Long Term Goal - patient states her fluid retention has significantly improved,  says she cannot tolerate the compression stockings because they are too tight and make her legs hurt Assessed patient's heart failure management strategies  Diabetes:  (Status: Goal on Track (progressing): YES.) Long Term Goal   Lab Results  Component Value Date   HGBA1C 6.7 (H) 04/29/2021   Hgn A1C=6.3 on 12/26/21 Hgb A1C=5.2 in October per patient HGBA1C=5.8 in March per patient HGBA1C=6.1 on 12/06/24  Assessed patient's understanding of A1C goal: <7% Reviewed medications with patient and discussed importance of medication adherence;        Counseled on importance of regular laboratory monitoring as prescribed;        Discussed plans with patient for ongoing care management follow up and provided patient with  direct contact information for care management team;      Advised patient, providing education and rationale, to check cbg three times daily and record        call provider for findings outside established parameters;       Review of patient status, including review of consultants reports, relevant laboratory and other test results, and medications completed;       Discussed addition of Ozempic to medication regimen , reviewed mechanism of action and proven cardiovascular benefits To schedule ENDO appt-discussed nutrition referral, [facility], [facility]-ENDO appt scheduled-patient declines  referral.  Hypertension and CKD stage 4 : (Status: Goal on Track (progressing): YES.) Long Term Goal-  has been placed on kidney transplant waiting list, fistu;a placed 12/09/21,ready for use 03/11/22. Last practice recorded BP readings:  BP 04/01/22-145/87 BP 05/01/22-141/70 BP 08/04/22-155/70 BP 11/05/22-152/76  BP Readings from Last 3 Encounters:  09/30/21 120/70  09/12/21 (!) 107/58  09/08/21 (!) 144/71  12/09/22         136-148/62-72    Most recent eGFR/CrCl:    No components found for: CRCL Lab Results  Component Value Date   NA 136 08/19/2021   K 3.9 08/19/2021   CREATININE 2.51 (H) 08/19/2021   EGFR 19 (L) 03/13/2021   GFRNONAA 22 (L) 08/19/2021   GLUCOSE 122 (H) 08/19/2021    Evaluation of current treatment plan related to hypertension self management and patient's adherence to plan as established by provider;   Reviewed medications with patient and discussed importance of compliance;  Discussed plans with patient for ongoing care management follow up and provided patient with direct contact information for care management team; Advised patient, providing education and rationale, to monitor blood pressure daily and record, calling PCP for findings outside established parameters;  Reviewed scheduled/upcoming provider appointments  Patient with labile blood pressures, medication adjusted, patient to  f/u with nephrologist-appt in March, also PCP-completed-continues to follow with providers  Weight Loss Interventions:  (Status:  New goal.) Long Term Goal--patient has started Ozempic Discussed indications, mechanism of action, common side effects and CV benefits  of GLP1 Ozempic Assessed tolerance of Ozempic 08/04/22-patient states she has gained 7 pounds this month because she is not eating well-discussed nutrition referral, exercise, and [facility]-patient states she will check on these. 09/03/22: referrals declined 12/08/22: 209, medications adjusted, Ozempic  2mg , Novolog and Lantus adjusted  Interdisciplinary Collaboration:  (Status: Goal on Track (progressing): YES.) Long Term Goal  Collaborated with BSW to initiate plan of care to address needs related to Housing barriers, Inability to perform ADL's independently, and Inability to perform IADL's independently in patient with CHF, CAD, HTN, HLD, DM, CKD Stage 4, and Depression Assessed status of patient's involvement with behavioral health counselor Collaboration with pharmacist for medication review, discussion of ? Medication side effects causing chronic constipation and arranging for patient to received home delivery of medications in blister packs  Patient Goals/Self-Care Activities: Take medications as prescribed   Attend all scheduled provider appointments Call pharmacy for medication refills 3-7 days in advance of running out of medications Attend church or other social activities Perform all self care activities independently  Call provider office for new concerns or questions  Work with the social worker to address care coordination needs and will continue to work with the clinical team to address health care and disease management related needs call office if I gain more than 2 pounds in one day or 5 pounds in one week keep legs up while  sitting track weight in diary use salt in moderation watch for swelling in feet, ankles and legs every day weigh myself daily know when to call the doctorfor fluid weight gain or consistently abnormal BP readings at home track symptoms and what helps feel better or worse check blood sugar at prescribed times: three times daily enter blood sugar readings and medication or insulin into daily log take the blood sugar log to all doctor visits take the blood sugar meter to all doctor visits eat fish at least once per week fill half of plate with vegetables manage portion size check blood pressure daily write blood pressure results in a log or  diary learn about high blood pressure take blood pressure log to all doctor appointments call doctor for signs and symptoms of high blood pressure keep all doctor appointments take medications for blood pressure exactly as prescribed Limit fluid intake to 1 liter per day Collaborate with managed Medicaid team of BSW and pharmacist to address identified needs Work with cardiology pharmacist to assist with BP stabilization Work with [facility] LCSW and psychiatrist to treat depression Review summary of Well[employer] benefits mailed to home address and call to enroll in program that will benefit your chronic health issues Continue to use [facility] Care Management calendar to record weight , BP and CBGs so readings are all in one place Schedule dental appt Schedule ENDO appt-completed.

## 2022-12-09 NOTE — Patient Outreach (Signed)
Medicaid Managed Care   Nurse Care Manager Note  12/09/2022 Name:  Maria Hudson MRN:  [id number] DOB:  01/13/1961  Maria Hudson is an 62 y.o. year old female who is a primary patient of Fleet Contras, MD.  The [facility] Managed Care Coordination team was consulted for assistance with:    Chronic healthcare management needs, HTN, retinopathy, chronic pain, depression, gout, CKD, DM, CAD, CHF, OSA  [name] was given information about Medicaid Managed Care Coordination team services today. [name] Patient agreed to services and verbal consent obtained.  [name] for follow up visit in response to provider referral for case management and/or care coordination services.   Assessments/Interventions:  Review of past medical history, allergies, medications, health status, including review of consultants reports, laboratory and other test data, was performed as part of comprehensive evaluation and provision of chronic care management services.  SDOH (Social Determinants of Health) assessments and interventions performed: SDOH Interventions    Flowsheet Row Patient Outreach Telephone from 12/09/2022 in [place] POPULATION HEALTH DEPARTMENT Patient Outreach Telephone from 11/05/2022 in [place] POPULATION HEALTH DEPARTMENT Patient Outreach Telephone from 11/02/2022 in [place] POPULATION HEALTH DEPARTMENT Patient Outreach Telephone from 10/06/2022 in [place] POPULATION HEALTH DEPARTMENT Patient Outreach Telephone from 09/22/2022 in [place] POPULATION HEALTH DEPARTMENT Patient Outreach Telephone from 09/03/2022 in [place] POPULATION HEALTH DEPARTMENT  SDOH Interventions        Food Insecurity Interventions -- Intervention Not Indicated -- -- -- --  Housing Interventions Intervention Not Indicated -- -- -- -- --  Utilities Interventions -- -- -- -- -- Intervention Not Indicated  Alcohol Usage Interventions -- Intervention Not Indicated (Score <7) -- -- --  --  Depression Interventions/Treatment  Counseling, Medication  [patient speaks with [facility] every week] -- -- -- -- --  Physical Activity Interventions -- -- -- Other (Comments)  [not phyasically able to enage in a moderate to strenuous exercise routine] -- --  Stress Interventions -- -- [employer], Provide Counseling -- [employer], Provide Counseling --     Care Plan  Allergies  Allergen Reactions   Metformin And Related Nausea Only   Other     Clonidine patch causes skin irritation    Hydrocodone Itching    Patient able to tolerate when taken with Benadryl.   Oxycodone-Acetaminophen Itching    Patient able to tolerate when taken with Benadryl.   Medications Reviewed Today     Reviewed by [name], RN (Registered Nurse) on 12/09/22 at 1100  Med List Status: <None>   Medication Order Taking? Sig Documenting Provider Last Dose Status Informant  Accu-Chek Softclix Lancets lancets [id number] No 3 (three) times daily. [provider] Taking Active Self  acetaminophen (TYLENOL) 500 MG tablet [id number] No Take 1,000 mg by mouth every 6 (six) hours as needed for moderate pain or headache. [provider] Taking Active Self  allopurinol (ZYLOPRIM) 100 MG tablet [id number] No Take 100 mg by mouth in the morning. [provider] Taking Active Self  amLODipine (NORVASC) 10 MG tablet [id number] No Take 10 mg by mouth daily. [provider] Taking Active   aspirin EC 81 MG tablet [id number] No Take 81 mg by mouth in the morning. Swallow whole. [provider] Taking Active Self           Med Note [name]   Mon Apr 14, 2021  9:22 PM)    atorvastatin (LIPITOR) 40 MG tablet [id number]  No Take 1 tablet (40 mg total) by mouth every evening. Chilton Si, MD Taking Active Self  buPROPion [facility]) 100 MG tablet [id number] No Take 100 mg by mouth 2 (two) times daily. [provider] Taking Active Self  carvedilol (COREG) 25 MG tablet [id number] No Take 1 tablet (25 mg total) by mouth 2 (two) times daily with a meal. [name], York Ram, MD Taking Active Self  cholecalciferol (VITAMIN D3) 25 MCG (1000 UNIT) tablet [id number] No Take 2,000 Units by mouth daily. [provider] Taking Active Self  cloNIDine (CATAPRES) 0.3 MG tablet [id number] No Take 0.3 mg by mouth 3 (three) times daily. [provider] Taking Active Self           Med Note Suezanne Cheshire   Tue Mar 03, 2022  9:23 AM)    Continuous Blood Gluc Transmit (DEXCOM G6 TRANSMITTER) MISC [id number] No Use one every 3 month [provider] Taking Active   dapagliflozin propanediol (FARXIGA) 10 MG TABS tablet [id number] No Take 1 tablet (10 mg total) by mouth daily before breakfast. Jacklynn Ganong, FNP Taking Active   ferrous sulfate 325 (65 FE) MG tablet [id number] No Take 325 mg by mouth 2 (two) times daily. [provider] Taking Active Self  fluticasone (FLONASE) 50 MCG/ACT nasal spray [id number] No Place 1 spray into both nostrils daily as needed for allergies or rhinitis. [provider] Taking Active Self  hydrALAZINE (APRESOLINE) 100 MG tablet [id number] No TAKE 1 TABLET BY MOUTH 3 TIMES DAILY. Chilton Si, MD Taking Active   insulin glargine (LANTUS) 100 unit/mL SOPN [id number] No Inject 10 Units into the skin at bedtime. [provider] Taking Active Self           Med Note [name], [name]   Tue Oct 13, 2022  9:23 AM)    isosorbide mononitrate (IMDUR) 120 MG 24 hr tablet [id number] No Take 120 mg by mouth daily. [provider] Taking Active   linaclotide Karlene Einstein) 145 MCG CAPS capsule [id number] No Take 1 capsule (145 mcg total) by mouth daily before breakfast. Zehr, Princella Pellegrini, PA-C Taking Active   loratadine (CLARITIN) 10 MG tablet [id number] No Take 10 mg by mouth in the morning. [provider] Taking  Active Self  Multiple Vitamin (MULTIVITAMIN ADULT) TABS [id number] No Take 1 tablet by mouth in the morning. [provider] Taking Active Self  nitroGLYCERIN (NITROSTAT) 0.4 MG SL tablet [id number] No Place 1 tablet (0.4 mg total) under the tongue every 5 (five) minutes as needed for chest pain. [name], York Ram, MD Taking Active Self           Med Note Kittie Plater, Smitty Cords   Tue Aug 19, 2021  9:35 AM)    Rubbie Battiest FLEXPEN 100 UNIT/ML FlexPen [id number] No Inject 5 Units into the skin 3 (three) times daily with meals. [provider] Taking Active Self           Med Note [name], [name]   Tue Oct 13, 2022  9:23 AM)    Omega-3 Fatty Acids (FISH OIL) 1200 MG CAPS [id number] No Take 1,200 mg by mouth in the morning. [provider] Taking Active Self  Polyethylene Glycol 3350 (MIRALAX PO) [id number] No Take by mouth daily as needed. [provider] Taking Active   pregabalin (LYRICA) 25 MG capsule [id number] No Take 25 mg by mouth 2 (two) times daily. [provider] Taking Active Self  Semaglutide, 2 MG/DOSE, (OZEMPIC, 2 MG/DOSE,) 8  MG/3ML SOPN [id number]  Inject 2 mg into the skin once a week. Pavero, Cristal Deer, [facility]  Active   sertraline (ZOLOFT) 50 MG tablet [id number] No Take 50 mg by mouth in the morning. [provider] Taking Active Self  spironolactone (ALDACTONE) 50 MG tablet [id number] No Take 50 mg by mouth daily. [provider] Taking Active   torsemide (DEMADEX) 20 MG tablet [id number] No Take 1 tablet (20 mg total) by mouth daily as needed. Alta, Anderson Malta, FNP Taking Active   traZODone (DESYREL) 50 MG tablet [id number] No Take 50 mg by mouth at bedtime. [provider] Taking Active Self  TURMERIC CURCUMIN PO [id number] No Take 500 mg by mouth daily at 6 (six) AM. [provider] Taking Active            Patient Active Problem List   Diagnosis Date Noted   Chronic constipation 03/31/2022    Pain due to onychomycosis of toenails of both feet 12/05/2021   Pure hypercholesterolemia 05/27/2021   Chronic diastolic CHF (congestive heart failure) (HCC) 04/29/2021   Hypertensive urgency 04/14/2021   Acute on chronic diastolic CHF (congestive heart failure) (HCC) 04/14/2021   Chest pain 03/05/2021   CAD (coronary artery disease) 03/05/2021   T2DM (type 2 diabetes mellitus) (HCC) 03/05/2021   CKD (chronic kidney disease) stage 4, GFR 15-29 ml/min (HCC) 03/05/2021   Class 3 obesity (HCC) 03/05/2021   CVA (cerebral vascular accident) (HCC) 03/05/2021   Acute on chronic diastolic heart failure (HCC) 03/05/2021   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   AKI (acute kidney injury) (HCC) 03/05/2021   Vitreous hemorrhage of left eye (HCC) 11/14/2020   Orthopnea 08/06/2020   Vision loss of right eye 08/06/2020   Excessive daytime sleepiness 01/11/2020   Iron deficiency anemia 12/14/2019   Vitreous hemorrhage, right (HCC) 12/04/2019   Diabetic neuropathy (HCC) 07/26/2019   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) 02/03/2019   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   Conductive hearing loss, bilateral 03/03/2018   Chronic bilateral low back pain without sciatica 08/21/2015   Chronic kidney disease (CKD) stage G3b/A3, moderately decreased glomerular filtration rate (GFR) between 30-44 mL/min/1.73 square meter and albuminuria creatinine ratio greater than 300 mg/g (HCC) 04/30/2015   Type 2 diabetes mellitus with diabetic polyneuropathy, with long-term current use of insulin (HCC) 02/15/2015   Advance directive on file 05/17/2014   Refusal of blood transfusions as patient is Jehovah's Witness 05/17/2014   OSA treated with BiPAP 02/23/2014   Coronary artery disease 07/07/2013   Severe obesity (BMI >= 40) (HCC) 05/18/2013   Neuropathy 04/11/2013   Diastolic heart failure (HCC) 10/16/2012   Essential hypertension 10/16/2012    Conditions to be addressed/monitored per PCP order:  Chronic healthcare management needs, HTN, retinopathy, chronic pain, depression, gout, CKD, DM, CAD, CHF, OSA  Care Plan : RN Care Manager Plan Of Care  Updates made by [name], RN since 12/09/2022 12:00 AM     Problem: Knowledge Deficit and Care Coordination Needs Related to Management of HTN, HF, Type 2 DM   Priority: High     Long-Range Goal: Development of Plan of Care to Address Knowledge Deficits and Care Coordination Needs related to  management of CHF, CAD, HTN, HLD, DMII, CKD Stage 4, and Depression   Start Date: 05/06/2021  Expected End Date: 02/05/2023  Priority: High  Note:   Current Barriers:  Knowledge Deficits related to plan of care for management of CHF,  CAD, HTN, HLD, DMII, CKD Stage 4, and Depression 12/09/22:  No specific complaints today.  Up to date on all appts.  Awaiting ORTHO and ENT referrals.  BP and BG stable-95-113. CP unchanged.  [facility] appts continue.    RNCM Clinical Goal(s):  Patient will demonstrate ongoing adherence to prescribed treatment plan for CHF, CAD, HTN, HLD, DMII, CKD Stage 4, and Depression as evidenced by patient report of improved health and quality of life and no unplanned ED or unplanned hospital admissions continue to work with RN Care Manager and/or Social Worker to address care management and care coordination needs related to CHF, CAD, HTN, HLD, DMII, CKD Stage 4, and Depression as evidenced by adherence to CM Team Scheduled appointments      Work with [facility] LCSW and psychiatrist to treat depression Patient to schedule  an appt with ENDO-scheduled  for May  Interventions: Inter-disciplinary care team collaboration (see longitudinal plan of care) Evaluation of current treatment plan related to  self management and patient's adherence to plan as established by provider Mailed summary of benefits for her [facility] Managed Medicaid plan to her home address Collaborated  with Care Guide for dental resources-completed. Care Guide referral for dental resources Collaborated with BSW for assistance with [facility] appt.-completed. BSW referral for appt assistance. Collaborated with Pharmacy regarding  uncontrolled HTN Pharmacy referral for uncontrolled HTN-completed. Collaborated with LCSW for depression LCSW referral for depression-completed BSW referral for dental and PCP resources-completed. Collaborated with BSW  CAD Interventions: (Status:  New goal.) Long Term Goal Assessed understanding of CAD diagnosis Medications reviewed including medications utilized in CAD treatment plan Provided education on importance of blood pressure control in management of CAD Counseled on importance of regular laboratory monitoring as prescribed Reviewed Importance of taking all medications as prescribed Reviewed Importance of attending all scheduled provider appointments Advised to report any changes in symptoms or exercise tolerance Assessed social determinant of health barriers  Hyperlipidemia Interventions:  (Status:  New goal.) Long Term Goal Medication review performed; medication list updated in electronic medical record.  Provider established cholesterol goals reviewed Counseled on importance of regular laboratory monitoring as prescribed Reviewed importance of limiting foods high in cholesterol Assessed social determinant of health barriers     Heart Failure Interventions:  (Status: Goal on Track (progressing): YES.)  Long Term Goal - patient states her fluid retention has significantly improved,  says she cannot tolerate the compression stockings because they are too tight and make her legs hurt Assessed patient's heart failure management strategies  Diabetes:  (Status: Goal on Track (progressing): YES.) Long Term Goal   Lab Results  Component Value Date   HGBA1C 6.7 (H) 04/29/2021   Hgn A1C=6.3 on 12/26/21 Hgb A1C=5.2 in October per patient HGBA1C=5.8  in March per patient HGBA1C=6.1 on 12/06/24  Assessed patient's understanding of A1C goal: <7% Reviewed medications with patient and discussed importance of medication adherence;        Counseled on importance of regular laboratory monitoring as prescribed;        Discussed plans with patient for ongoing care management follow up and provided patient with direct contact information for care management team;      Advised patient, providing education and rationale, to check cbg three times daily and record        call provider for findings outside established parameters;       Review of patient status, including review of consultants reports, relevant laboratory and other test results, and medications completed;  Discussed addition of Ozempic to medication regimen , reviewed mechanism of action and proven cardiovascular benefits To schedule ENDO appt-discussed nutrition referral, [facility], [facility]-ENDO appt scheduled-patient declines referral.  Hypertension and CKD stage 4 : (Status: Goal on Track (progressing): YES.) Long Term Goal-  has been placed on kidney transplant waiting list, fistu;a placed 12/09/21,ready for use 03/11/22. Last practice recorded BP readings:  BP 04/01/22-145/87 BP 05/01/22-141/70 BP 08/04/22-155/70 BP 11/05/22-152/76  BP Readings from Last 3 Encounters:  09/30/21 120/70  09/12/21 (!) 107/58  09/08/21 (!) 144/71  12/09/22         136-148/62-72    Most recent eGFR/CrCl:    No components found for: CRCL Lab Results  Component Value Date   NA 136 08/19/2021   K 3.9 08/19/2021   CREATININE 2.51 (H) 08/19/2021   EGFR 19 (L) 03/13/2021   GFRNONAA 22 (L) 08/19/2021   GLUCOSE 122 (H) 08/19/2021    Evaluation of current treatment plan related to hypertension self management and patient's adherence to plan as established by provider;   Reviewed medications with patient and discussed importance of compliance;  Discussed plans with patient for  ongoing care management follow up and provided patient with direct contact information for care management team; Advised patient, providing education and rationale, to monitor blood pressure daily and record, calling PCP for findings outside established parameters;  Reviewed scheduled/upcoming provider appointments  Patient with labile blood pressures, medication adjusted, patient to  f/u with nephrologist-appt in March, also PCP-completed-continues to follow with providers  Weight Loss Interventions:  (Status:  New goal.) Long Term Goal--patient has started Ozempic Discussed indications, mechanism of action, common side effects and CV benefits  of GLP1 Ozempic Assessed tolerance of Ozempic 08/04/22-patient states she has gained 7 pounds this month because she is not eating well-discussed nutrition referral, exercise, and [facility]-patient states she will check on these. 09/03/22: referrals declined 12/08/22: 209, medications adjusted, Ozempic 2mg , Novolog and Lantus adjusted  Interdisciplinary Collaboration:  (Status: Goal on Track (progressing): YES.) Long Term Goal  Collaborated with BSW to initiate plan of care to address needs related to Housing barriers, Inability to perform ADL's independently, and Inability to perform IADL's independently in patient with CHF, CAD, HTN, HLD, DM, CKD Stage 4, and Depression Assessed status of patient's involvement with behavioral health counselor Collaboration with pharmacist for medication review, discussion of ? Medication side effects causing chronic constipation and arranging for patient to received home delivery of medications in blister packs  Patient Goals/Self-Care Activities: Take medications as prescribed   Attend all scheduled provider appointments Call pharmacy for medication refills 3-7 days in advance of running out of medications Attend church or other social activities Perform all self care activities independently  Call  provider office for new concerns or questions  Work with the social worker to address care coordination needs and will continue to work with the clinical team to address health care and disease management related needs call office if I gain more than 2 pounds in one day or 5 pounds in one week keep legs up while sitting track weight in diary use salt in moderation watch for swelling in feet, ankles and legs every day weigh myself daily know when to call the doctorfor fluid weight gain or consistently abnormal BP readings at home track symptoms and what helps feel better or worse check blood sugar at prescribed times: three times daily enter blood sugar readings and medication or insulin into daily log take the blood  sugar log to all doctor visits take the blood sugar meter to all doctor visits eat fish at least once per week fill half of plate with vegetables manage portion size check blood pressure daily write blood pressure results in a log or diary learn about high blood pressure take blood pressure log to all doctor appointments call doctor for signs and symptoms of high blood pressure keep all doctor appointments take medications for blood pressure exactly as prescribed Limit fluid intake to 1 liter per day Collaborate with managed Medicaid team of BSW and pharmacist to address identified needs Work with cardiology pharmacist to assist with BP stabilization Work with [facility] LCSW and psychiatrist to treat depression Review summary of [employer] benefits mailed to home address and call to enroll in program that will benefit your chronic health issues Continue to use [facility] Care Management calendar to record weight , BP and CBGs so readings are all in one place Schedule dental appt Schedule ENDO appt-completed.   Long-Range Goal: Establish Plan of Care for Chronic Disease Management Needs   Priority: High  Note:   Timeframe:  Long-Range Goal Priority:   High Start Date:     11/12/21                        Expected End Date:  ongoing                     Follow Up Date: 01/08/23   - schedule appointment for vaccines needed due to my age or health - schedule recommended health tests (blood work, mammogram, colonoscopy, pap test) - schedule and keep appointment for annual check-up    Why is this important?   Screening tests can find diseases early when they are easier to treat.  Your doctor or nurse will talk with you about which tests are important for you.  Getting shots for common diseases like the flu and shingles will help prevent them.  12/09/22:  patient with recent PCP/ENDO appt.  Awaiting ORTHO and ENT referrals.  Has upcoming CARDS/GI appt.  Weekly [facility] virtual appts continue   Follow Up:  Patient agrees to Care Plan and Follow-up.  Plan: The Managed Medicaid care management team will reach out to the patient again over the next 30 business  days. and The  Patient has been provided with contact information for the Managed Medicaid care management team and has been advised to call with any health related questions or concerns.  Date/time of next scheduled RN care management/care coordination outreach: 01/08/23 at 1030.

## 2022-12-10 DIAGNOSIS — F4321 Adjustment disorder with depressed mood: Secondary | ICD-10-CM | POA: Diagnosis not present

## 2022-12-14 DIAGNOSIS — Z419 Encounter for procedure for purposes other than remedying health state, unspecified: Secondary | ICD-10-CM | POA: Diagnosis not present

## 2022-12-15 ENCOUNTER — Other Ambulatory Visit: Payer: Medicaid Other

## 2022-12-15 NOTE — Patient Outreach (Signed)
Medicaid Managed Care Social Work Note  12/15/2022 Name:  Maria Hudson MRN:  [id number] DOB:  1961-04-15  Maria Hudson is an 62 y.o. year old female who is a primary patient of Maria Contras, MD.  The Medicaid Managed Care Coordination team was consulted for assistance with:   housing   Maria Hudson was given information about Medicaid Managed Care Coordination team services today. Maria Hudson Patient agreed to services and verbal consent obtained.  Engaged with patient  for by telephone forfollow up visit in response to referral for case management and/or care coordination services.   Assessments/Interventions:  Review of past medical history, allergies, medications, health status, including review of consultants reports, laboratory and other test data, was performed as part of comprehensive evaluation and provision of chronic care management services.  SDOH: (Social Determinant of Health) assessments and interventions performed: SDOH Interventions    Flowsheet Row Patient Outreach Telephone from 12/09/2022 in [place] POPULATION HEALTH DEPARTMENT Patient Outreach Telephone from 11/05/2022 in [place] POPULATION HEALTH DEPARTMENT Patient Outreach Telephone from 11/02/2022 in [place] POPULATION HEALTH DEPARTMENT Patient Outreach Telephone from 10/06/2022 in [place] POPULATION HEALTH DEPARTMENT Patient Outreach Telephone from 09/22/2022 in [place] POPULATION HEALTH DEPARTMENT Patient Outreach Telephone from 09/03/2022 in [place] POPULATION HEALTH DEPARTMENT  SDOH Interventions        Food Insecurity Interventions -- Intervention Not Indicated -- -- -- --  Housing Interventions Intervention Not Indicated -- -- -- -- --  Utilities Interventions -- -- -- -- -- Intervention Not Indicated  Alcohol Usage Interventions -- Intervention Not Indicated (Score <7) -- -- -- --  Depression Interventions/Treatment  Counseling, Medication  [patient speaks with [facility] every week] --  -- -- -- --  Physical Activity Interventions -- -- -- Other (Comments)  [not phyasically able to enage in a moderate to strenuous exercise routine] -- --  Stress Interventions -- -- [employer], Provide Counseling -- [employer], Provide Counseling --     BSW completed a telephone outreach with patient, she states she is doing well and would like some affordable housing options in other counties. Patient states she is having a hard time locating affordable housing options in [facility]. BSW will email patient resources for [facility].  Advanced Directives Status:  Not addressed in this encounter.  Care Plan                 Allergies  Allergen Reactions   Metformin And Related Nausea Only   Other     Clonidine patch causes skin irritation    Hydrocodone Itching    Patient able to tolerate when taken with Benadryl.   Oxycodone-Acetaminophen Itching    Patient able to tolerate when taken with Benadryl.    Medications Reviewed Today     Reviewed by Maria Chandler, RN (Registered Nurse) on 12/09/22 at 1100  Med List Status: <None>   Medication Order Taking? Sig Documenting Provider Last Dose Status Informant  Accu-Chek Softclix Lancets lancets [id number] No 3 (three) times daily. [provider] Taking Active Self  acetaminophen (TYLENOL) 500 MG tablet [id number] No Take 1,000 mg by mouth every 6 (six) hours as needed for moderate pain or headache. [provider] Taking Active Self  allopurinol (ZYLOPRIM) 100 MG tablet [id number] No Take 100 mg by mouth in the morning. [provider] Taking Active Self  amLODipine (NORVASC) 10 MG tablet [id number] No Take 10 mg by mouth daily. [provider] Taking Active  aspirin EC 81 MG tablet [id number] No Take 81 mg by mouth in the morning. Swallow whole. [provider] Taking Active Self           Med Note Maria Hudson   Mon Apr 14, 2021   9:22 PM)    atorvastatin (LIPITOR) 40 MG tablet [id number] No Take 1 tablet (40 mg total) by mouth every evening. Maria Si, MD Taking Active Self  buPROPion [facility]) 100 MG tablet [id number] No Take 100 mg by mouth 2 (two) times daily. [provider] Taking Active Self  carvedilol (COREG) 25 MG tablet [id number] No Take 1 tablet (25 mg total) by mouth 2 (two) times daily with a meal. [name], Maria Ram, MD Taking Active Self  cholecalciferol (VITAMIN D3) 25 MCG (1000 UNIT) tablet [id number] No Take 2,000 Units by mouth daily. [provider] Taking Active Self  cloNIDine (CATAPRES) 0.3 MG tablet [id number] No Take 0.3 mg by mouth 3 (three) times daily. [provider] Taking Active Self           Med Note Maria Hudson   Tue Mar 03, 2022  9:23 AM)    Continuous Blood Gluc Transmit (DEXCOM G6 TRANSMITTER) MISC [id number] No Use one every 3 month [provider] Taking Active   dapagliflozin propanediol (FARXIGA) 10 MG TABS tablet [id number] No Take 1 tablet (10 mg total) by mouth daily before breakfast. Maria Ganong, FNP Taking Active   ferrous sulfate 325 (65 FE) MG tablet [id number] No Take 325 mg by mouth 2 (two) times daily. [provider] Taking Active Self  fluticasone (FLONASE) 50 MCG/ACT nasal spray [id number] No Place 1 spray into both nostrils daily as needed for allergies or rhinitis. [provider] Taking Active Self  hydrALAZINE (APRESOLINE) 100 MG tablet [id number] No TAKE 1 TABLET BY MOUTH 3 TIMES DAILY. Maria Si, MD Taking Active   insulin glargine (LANTUS) 100 unit/mL SOPN [id number] No Inject 10 Units into the skin at bedtime. [provider] Taking Active Self           Med Note Maria Hudson   Tue Oct 13, 2022  9:23 AM)    isosorbide mononitrate (IMDUR) 120 MG 24 hr tablet [id number] No Take 120 mg by mouth daily. [provider] Taking Active   linaclotide  Karlene Einstein) 145 MCG CAPS capsule [id number] No Take 1 capsule (145 mcg total) by mouth daily before breakfast. Zehr, Maria Pellegrini, PA-C Taking Active   loratadine (CLARITIN) 10 MG tablet [id number] No Take 10 mg by mouth in the morning. [provider] Taking Active Self  Multiple Vitamin (MULTIVITAMIN ADULT) TABS [id number] No Take 1 tablet by mouth in the morning. [provider] Taking Active Self  nitroGLYCERIN (NITROSTAT) 0.4 MG SL tablet [id number] No Place 1 tablet (0.4 mg total) under the tongue every 5 (five) minutes as needed for chest pain. [name], Maria Ram, MD Taking Active Self           Med Note Kittie Plater, Smitty Cords   Tue Aug 19, 2021  9:35 AM)    Rubbie Battiest FLEXPEN 100 UNIT/ML FlexPen [id number] No Inject 5 Units into the skin 3 (three) times daily with meals. [provider] Taking Active Self           Med Note Maria Hudson   Tue Oct 13, 2022  9:23 AM)    Omega-3 Fatty Acids (FISH OIL) 1200 MG CAPS [id number] No Take 1,200 mg by mouth in the morning. [provider] Taking Active Self  Polyethylene Glycol 3350 (MIRALAX PO) [id number] No Take by mouth daily as needed. [provider] Taking Active   pregabalin (LYRICA) 25 MG capsule [id number] No Take 25 mg by mouth 2 (two) times daily. [provider] Taking Active Self  Semaglutide, 2 MG/DOSE, (OZEMPIC, 2 MG/DOSE,) 8 MG/3ML SOPN [id number]  Inject 2 mg into the skin once a week. Pavero, Cristal Deer, [facility]  Active   sertraline (ZOLOFT) 50 MG tablet [id number] No Take 50 mg by mouth in the morning. [provider] Taking Active Self  spironolactone (ALDACTONE) 50 MG tablet [id number] No Take 50 mg by mouth daily. [provider] Taking Active   torsemide (DEMADEX) 20 MG tablet [id number] No Take 1 tablet (20 mg total) by mouth daily as needed. Morris, Anderson Malta, FNP Taking Active   traZODone (DESYREL) 50 MG tablet [id number] No Take 50 mg by mouth at bedtime.  [provider] Taking Active Self  TURMERIC CURCUMIN PO [id number] No Take 500 mg by mouth daily at 6 (six) AM. [provider] Taking Active             Patient Active Problem List   Diagnosis Date Noted   Chronic constipation 03/31/2022   Pain due to onychomycosis of toenails of both feet 12/05/2021   Pure hypercholesterolemia 05/27/2021   Chronic diastolic CHF (congestive heart failure) (HCC) 04/29/2021   Hypertensive urgency 04/14/2021   Acute on chronic diastolic CHF (congestive heart failure) (HCC) 04/14/2021   Chest pain 03/05/2021   CAD (coronary artery disease) 03/05/2021   T2DM (type 2 diabetes mellitus) (HCC) 03/05/2021   CKD (chronic kidney disease) stage 4, GFR 15-29 ml/min (HCC) 03/05/2021   Class 3 obesity (HCC) 03/05/2021   CVA (cerebral vascular accident) (HCC) 03/05/2021   Acute on chronic diastolic heart failure (HCC) 03/05/2021   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   AKI (acute kidney injury) (HCC) 03/05/2021   Vitreous hemorrhage of left eye (HCC) 11/14/2020   Orthopnea 08/06/2020   Vision loss of right eye 08/06/2020   Excessive daytime sleepiness 01/11/2020   Iron deficiency anemia 12/14/2019   Vitreous hemorrhage, right (HCC) 12/04/2019   Diabetic neuropathy (HCC) 07/26/2019   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) 02/03/2019   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   Conductive hearing loss, bilateral 03/03/2018   Chronic bilateral low back pain without sciatica 08/21/2015   Chronic kidney disease (CKD) stage G3b/A3, moderately decreased glomerular filtration rate (GFR) between 30-44 mL/min/1.73 square meter and albuminuria creatinine ratio greater than 300 mg/g (HCC) 04/30/2015   Type 2 diabetes mellitus with diabetic polyneuropathy, with long-term current use of insulin (HCC) 02/15/2015   Advance directive on file 05/17/2014   Refusal of blood transfusions as  patient is Jehovah's Witness 05/17/2014   OSA treated with BiPAP 02/23/2014   Coronary artery disease 07/07/2013   Severe obesity (BMI >= 40) (HCC) 05/18/2013   Neuropathy 04/11/2013   Diastolic heart failure (HCC) 10/16/2012   Essential hypertension 10/16/2012    Conditions to be addressed/monitored per PCP order:   affordable housing  There are no care plans that you recently modified to display for this patient.   Follow up:  Patient agrees to Care Plan and Follow-up.  Plan: The Managed Medicaid care management team will reach out to the patient again over the next 30 days.  Date/time of next scheduled Social Work care management/care coordination outreach:  01/15/23  Gus Puma, BSW, MHA [facility]  Managed [facility] Social Worker [phone]

## 2022-12-15 NOTE — Patient Instructions (Signed)
Visit Information  Ms. Kastens was given information about Medicaid Managed Care team care coordination services as a part of their [facility] Medicaid benefit. Shamica Kuhr verbally consented to engagement with the [facility] Managed Care team.   If you are experiencing a medical emergency, please call 911 or report to your local emergency department or urgent care.   If you have a non-emergency medical problem during routine business hours, please contact your provider's office and ask to speak with a nurse.   For questions related to your [facility] health plan, please call: [phone] or go here:[email or web address]  If you would like to schedule transportation through your [facility] plan, please call the following number at least 2 days in advance of your appointment: [phone].   You can also use the MTM portal or MTM mobile app to manage your rides. Reimbursement for transportation is available through [facility]! For the portal, please go to [email or web address]  Call the [facility] Crisis Line at [phone], at any time, 24 hours a day, 7 days a week. If you are in danger or need immediate medical attention call 911.  If you would like help to quit smoking, call 1-800-QUIT-NOW ((610)798-0225) OR Espaol: 1-855-Djelo-Ya (2-725-366-4403) o para ms informacin haga clic aqu or Text READY to 474-259 to register via text  Ms. Schaafsma - following are the goals we discussed in your visit today:   Goals Addressed   None     Social Worker will follow up in 30 days.   Gus Puma, Kenard Gower, MHA [facility] Health  Managed Medicaid Social Worker [phone]   Following is a copy of your plan of care:  There are no care plans that you recently modified to display for this patient.

## 2022-12-17 DIAGNOSIS — F4321 Adjustment disorder with depressed mood: Secondary | ICD-10-CM | POA: Diagnosis not present

## 2022-12-21 DIAGNOSIS — F4321 Adjustment disorder with depressed mood: Secondary | ICD-10-CM | POA: Diagnosis not present

## 2022-12-28 ENCOUNTER — Ambulatory Visit: Payer: Medicaid Other | Admitting: Gastroenterology

## 2022-12-28 ENCOUNTER — Encounter: Payer: Self-pay | Admitting: Gastroenterology

## 2022-12-28 VITALS — BP 160/80 | HR 68 | Ht 63.0 in | Wt 208.0 lb

## 2022-12-28 DIAGNOSIS — Z86010 Personal history of colonic polyps: Secondary | ICD-10-CM | POA: Insufficient documentation

## 2022-12-28 DIAGNOSIS — K5909 Other constipation: Secondary | ICD-10-CM | POA: Diagnosis not present

## 2022-12-28 DIAGNOSIS — F4321 Adjustment disorder with depressed mood: Secondary | ICD-10-CM | POA: Diagnosis not present

## 2022-12-28 MED ORDER — NA SULFATE-K SULFATE-MG SULF 17.5-3.13-1.6 GM/177ML PO SOLN: 1.0000 | Freq: Once | ORAL | 0 refills | Status: AC

## 2022-12-28 NOTE — Progress Notes (Signed)
12/28/2022 Maria Hudson [id number] Nov 25, 1960   HISTORY OF PRESENT ILLNESS:  This is a 62 year old female who is a patient of Dr. Irving Burton. She follows here for issues with chronic constipation.  She is currently on Linzess 72 mcg daily according to her report although her medication list says 145 mcg and I saw her in October last year it looks like we increased it to the 145 mcg daily at that point.  She is here today to schedule a colonoscopy.  Colonoscopy was May 2017 at [facility] with one 2 mm polyp removed.  I do not see the pathology.  In regards to her constipation she says that with the Linzess she does okay, but she usually has to take a dose of MiraLAX with the Linzess daily as well.  Says that sometimes that is too much/too strong though and causes diarrhea.  No rectal bleeding.  Has some tenderness/discomfort in her upper abdomen, that was evaluated in 2023 with endoscopy and CT scan.  Of note, she has an AV fistula but is not on dialysis.  Past Medical History:  Diagnosis Date   Acute on chronic diastolic heart failure (HCC) 03/05/2021   Anemia    Arthritis    CAD (coronary artery disease)    CHF (congestive heart failure) (HCC)    CKD (chronic kidney disease) stage 4, GFR 15-29 ml/min (HCC) 03/05/2021   Colon polyps    CVA (cerebral vascular accident) (HCC)    Depression    Diabetes mellitus without complication (HCC)    type 2   Dyspnea    Hypertension    Hypertensive urgency 04/14/2021   Memory loss    mild   OSA (obstructive sleep apnea) 05/27/2021   uses CPAP 12-18 cm   Pneumonia    several times   Pure hypercholesterolemia 05/27/2021   Renal disorder    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   [facility])    Past Surgical History:  Procedure Laterality Date   ABDOMINAL HYSTERECTOMY     AV FISTULA PLACEMENT Left 12/09/2021   Procedure: LEFT ARTERIOVENOUS (AV) FISTULA CREATION;  Surgeon: Maeola Harman, MD;  Location[facility] OR;  Service: Vascular;   Laterality: Left;   BIOPSY  09/08/2021   Procedure: BIOPSY;  Surgeon: Sherrilyn Rist, MD;  Location: [facility] ENDOSCOPY;  Service: Gastroenterology;;   CESAREAN SECTION     x 1   ESOPHAGOGASTRODUODENOSCOPY (EGD) WITH PROPOFOL N/A 09/08/2021   Procedure: ESOPHAGOGASTRODUODENOSCOPY (EGD) WITH PROPOFOL;  Surgeon: Sherrilyn Rist, MD;  Location: [facility] ENDOSCOPY;  Service: Gastroenterology;  Laterality: N/A;  chest pain, epigastric pain   INNER EAR SURGERY Bilateral    RIGHT HEART CATH N/A 05/14/2021   Procedure: RIGHT HEART CATH;  Surgeon: Laurey Morale, MD;  Location: [facility] INVASIVE CV LAB;  Service: Cardiovascular;  Laterality: N/A;   TONSILLECTOMY     age 30    reports that she has never smoked. She has never been exposed to tobacco smoke. She has never used smokeless tobacco. She reports that she does not drink alcohol and does not use drugs. family history includes Breast cancer in her paternal aunt; CAD in her sister; Diabetes in her daughter, mother, paternal grandmother, sister, and son; Hypertension in her daughter, father, mother, sister, and son; Kidney disease in her sister; Other in her mother; Stroke in her mother. Allergies  Allergen Reactions   Metformin And Related Nausea Only   Other     Clonidine patch causes skin irritation  Hydrocodone Itching    Patient able to tolerate when taken with Benadryl.   Oxycodone-Acetaminophen Itching    Patient able to tolerate when taken with Benadryl.      Outpatient Encounter Medications as of 12/28/2022  Medication Sig   Accu-Chek Softclix Lancets lancets 3 (three) times daily.   acetaminophen (TYLENOL) 500 MG tablet Take 1,000 mg by mouth every 6 (six) hours as needed for moderate pain or headache.   allopurinol (ZYLOPRIM) 100 MG tablet Take 100 mg by mouth in the morning.   amLODipine (NORVASC) 10 MG tablet Take 10 mg by mouth daily.   aspirin EC 81 MG tablet Take 81 mg by mouth in the morning. Swallow whole.   atorvastatin  (LIPITOR) 40 MG tablet Take 1 tablet (40 mg total) by mouth every evening.   buPROPion (WELLBUTRIN) 100 MG tablet Take 100 mg by mouth 2 (two) times daily.   carvedilol (COREG) 25 MG tablet Take 1 tablet (25 mg total) by mouth 2 (two) times daily with a meal.   cholecalciferol (VITAMIN D3) 25 MCG (1000 UNIT) tablet Take 2,000 Units by mouth daily.   cloNIDine (CATAPRES) 0.3 MG tablet Take 0.3 mg by mouth 3 (three) times daily.   Continuous Blood Gluc Transmit (DEXCOM G6 TRANSMITTER) MISC Use one every 3 month   dapagliflozin propanediol (FARXIGA) 10 MG TABS tablet Take 1 tablet (10 mg total) by mouth daily before breakfast.   ferrous sulfate 325 (65 FE) MG tablet Take 325 mg by mouth 2 (two) times daily.   fluticasone (FLONASE) 50 MCG/ACT nasal spray Place 1 spray into both nostrils daily as needed for allergies or rhinitis.   hydrALAZINE (APRESOLINE) 100 MG tablet TAKE 1 TABLET BY MOUTH 3 TIMES DAILY.   insulin glargine (LANTUS) 100 unit/mL SOPN Inject 10 Units into the skin at bedtime.   isosorbide mononitrate (IMDUR) 120 MG 24 hr tablet Take 120 mg by mouth daily.   linaclotide (LINZESS) 145 MCG CAPS capsule Take 1 capsule (145 mcg total) by mouth daily before breakfast.   loratadine (CLARITIN) 10 MG tablet Take 10 mg by mouth in the morning.   Multiple Vitamin (MULTIVITAMIN ADULT) TABS Take 1 tablet by mouth in the morning.   nitroGLYCERIN (NITROSTAT) 0.4 MG SL tablet Place 1 tablet (0.4 mg total) under the tongue every 5 (five) minutes as needed for chest pain.   NOVOLOG FLEXPEN 100 UNIT/ML FlexPen Inject 5 Units into the skin 3 (three) times daily with meals.   Omega-3 Fatty Acids (FISH OIL) 1200 MG CAPS Take 1,200 mg by mouth in the morning.   Polyethylene Glycol 3350 (MIRALAX PO) Take by mouth daily as needed.   pregabalin (LYRICA) 25 MG capsule Take 25 mg by mouth 2 (two) times daily.   Semaglutide, 2 MG/DOSE, (OZEMPIC, 2 MG/DOSE,) 8 MG/3ML SOPN Inject 2 mg into the skin once a week.    sertraline (ZOLOFT) 50 MG tablet Take 50 mg by mouth in the morning.   spironolactone (ALDACTONE) 50 MG tablet Take 50 mg by mouth daily.   torsemide (DEMADEX) 20 MG tablet Take 1 tablet (20 mg total) by mouth daily as needed.   traZODone (DESYREL) 50 MG tablet Take 50 mg by mouth at bedtime.   TURMERIC CURCUMIN PO Take 500 mg by mouth daily at 6 (six) AM.   No facility-administered encounter medications on file as of 12/28/2022.    REVIEW OF SYSTEMS  : All other systems reviewed and negative except where noted in the History of Present Illness.  PHYSICAL EXAM: BP (!) 160/80   Pulse 68   Ht 5\' 3"  (1.6 m)   Wt 208 lb (94.3 kg)   BMI 36.85 kg/m  General: Well developed female in no acute distress Head: Normocephalic and atraumatic Eyes:  Sclerae anicteric, conjunctiva pink. Ears: Normal auditory acuity Lungs: Clear throughout to auscultation; no W/R/R. Heart: Regular rate and rhythm; no M/R/G. Abdomen: Soft, non-distended.  BS present.  Diffuse upper abdominal TTP.Marland Kitchen Rectal:  Will be done at the time of colonoscopy. Musculoskeletal: Symmetrical with no gross deformities  Skin: No lesions on visible extremities Extremities: No edema  Neurological: Alert oriented x 4, grossly nonfocal Psychological:  Alert and cooperative. Normal mood and affect  ASSESSMENT AND PLAN: *Personal history of colon polyps:   Had one 2 mm polyp removed on colonoscopy in May 2017.  No pathology available.  Plan was for repeat in 10/2022.  Will schedule with Dr. Myrtie Neither.  Will plan for 2 day bowel prep.  The risks, benefits, and alternatives to colonoscopy were discussed with the patient and she consents to proceed.  *Chronic constipation: Says that she is on Linzess 72 mcg daily and does ok, but has to take MiraLAX daily with it.  She says that sometimes full dose of MiraLAX every day if too much so.  We can try to increase the Linzess to 145 mcg daily.  Samples were given.  If she likes that we can send a  prescription for that dose, otherwise she can try doing half a capful of MiraLAX daily in addition to her 72 mcg Linzess.  CC:  Fleet Contras, MD

## 2022-12-28 NOTE — Patient Instructions (Signed)
Linzess 145 mcg daily with breakfast, send Mychart message in 7-10 days with an update.  You have been scheduled for a colonoscopy. Please follow written instructions given to you at your visit today.   Please pick up your prep supplies at the pharmacy within the next 1-3 days.  If you use inhalers (even only as needed), please bring them with you on the day of your procedure.  DO NOT TAKE 7 DAYS PRIOR TO TEST- Trulicity (dulaglutide) Ozempic, Wegovy (semaglutide) Mounjaro (tirzepatide) Bydureon Bcise (exanatide extended release)  DO NOT TAKE 1 DAY PRIOR TO YOUR TEST Rybelsus (semaglutide) Adlyxin (lixisenatide) Victoza (liraglutide) Byetta (exanatide)  _______________________________________________________  If your blood pressure at your visit was 140/90 or greater, please contact your primary care physician to follow up on this.  _______________________________________________________  If you are age 25 or older, your body mass index should be between 23-30. Your Body mass index is 36.85 kg/m. If this is out of the aforementioned range listed, please consider follow up with your Primary Care Provider.  If you are age 65 or younger, your body mass index should be between 19-25. Your Body mass index is 36.85 kg/m. If this is out of the aformentioned range listed, please consider follow up with your Primary Care Provider.   ________________________________________________________  The [place] [facility] providers would like to encourage you to use [facility] to communicate with providers for non-urgent requests or questions.  Due to long hold times on the telephone, sending your provider a message by [facility] may be a faster and more efficient way to get a response.  Please allow 48 business hours for a response.  Please remember that this is for non-urgent requests.  _______________________________________________________

## 2022-12-29 NOTE — Progress Notes (Signed)
____________________________________________________________  Attending physician addendum:  Thank you for sending this case to me. I have reviewed the entire note and agree with the plan.   Henry Danis, MD  ____________________________________________________________  

## 2023-01-01 ENCOUNTER — Encounter: Payer: Self-pay | Admitting: Cardiovascular Disease

## 2023-01-04 ENCOUNTER — Telehealth: Payer: Self-pay | Admitting: Gastroenterology

## 2023-01-04 ENCOUNTER — Ambulatory Visit: Payer: Medicaid Other | Admitting: Gastroenterology

## 2023-01-04 VITALS — BP 179/87 | HR 69 | Temp 98.6°F | Ht 63.0 in | Wt 208.0 lb

## 2023-01-04 DIAGNOSIS — Z86010 Personal history of colonic polyps: Secondary | ICD-10-CM

## 2023-01-04 MED ORDER — PEG 3350-KCL-NA BICARB-NACL 420 G PO SOLR: 4000.0000 mL | Freq: Once | ORAL | 0 refills | Status: AC

## 2023-01-04 MED ORDER — FLEET ENEMA 7-19 GM/118ML RE ENEM
1.0000 | ENEMA | Freq: Once | RECTAL | Status: DC
Start: 2023-01-04 — End: 2023-10-12

## 2023-01-04 MED ORDER — SODIUM CHLORIDE 0.9 % IV SOLN
500.0000 mL | INTRAVENOUS | Status: DC
Start: 2023-01-04 — End: 2023-01-06

## 2023-01-04 NOTE — Progress Notes (Signed)
Patient came for scheduled colonoscopy, stating she had followed all instructions for her prep, but also stating her stools were still not clear. Admitting RN provided enema for patient per protocol, but stool was still brown/green and cloudy. Dr. Myrtie Neither observed and stated patient was still not cleaned out enough to move forward with today's procedure.   Patient stated she wanted to stay on clear liquids until the next available appointment which was on Wednesday, 01/06/23 with Dr. Adela Lank at 1600. RN reprinted prep instructions with 2-day Golytely prep as instructed by Dr. Myrtie Neither. RN thoroughly reviewed instructions with patient and sent new prescription to [facility]. Patient stated understanding.

## 2023-01-04 NOTE — Telephone Encounter (Signed)
Per Dr. Myrtie Neither will have patient try an enema when she arrives.

## 2023-01-04 NOTE — Telephone Encounter (Signed)
Returned pts call.  Her stools are still coming out dark brown liquid.  Will check with Dr. Myrtie Neither to see if he has any suggestions.

## 2023-01-04 NOTE — Telephone Encounter (Signed)
Patient called requesting a call back stating she is scheduled for this afternoon however she still have brown stuff coming out. Please advise.

## 2023-01-05 ENCOUNTER — Ambulatory Visit: Payer: Medicaid Other | Admitting: Orthopaedic Surgery

## 2023-01-06 ENCOUNTER — Encounter: Payer: Self-pay | Admitting: Gastroenterology

## 2023-01-06 ENCOUNTER — Ambulatory Visit (AMBULATORY_SURGERY_CENTER): Payer: Medicaid Other | Admitting: Gastroenterology

## 2023-01-06 ENCOUNTER — Telehealth: Payer: Self-pay | Admitting: Gastroenterology

## 2023-01-06 VITALS — BP 182/68 | HR 71 | Temp 98.9°F | Resp 15 | Ht 63.0 in | Wt 208.0 lb

## 2023-01-06 DIAGNOSIS — K621 Rectal polyp: Secondary | ICD-10-CM | POA: Diagnosis not present

## 2023-01-06 DIAGNOSIS — K635 Polyp of colon: Secondary | ICD-10-CM | POA: Diagnosis not present

## 2023-01-06 DIAGNOSIS — I509 Heart failure, unspecified: Secondary | ICD-10-CM | POA: Diagnosis not present

## 2023-01-06 DIAGNOSIS — I10 Essential (primary) hypertension: Secondary | ICD-10-CM | POA: Diagnosis not present

## 2023-01-06 DIAGNOSIS — D123 Benign neoplasm of transverse colon: Secondary | ICD-10-CM

## 2023-01-06 DIAGNOSIS — F32A Depression, unspecified: Secondary | ICD-10-CM | POA: Diagnosis not present

## 2023-01-06 DIAGNOSIS — D128 Benign neoplasm of rectum: Secondary | ICD-10-CM

## 2023-01-06 DIAGNOSIS — Z86010 Personal history of colonic polyps: Secondary | ICD-10-CM | POA: Diagnosis not present

## 2023-01-06 DIAGNOSIS — Z09 Encounter for follow-up examination after completed treatment for conditions other than malignant neoplasm: Secondary | ICD-10-CM

## 2023-01-06 DIAGNOSIS — I2510 Atherosclerotic heart disease of native coronary artery without angina pectoris: Secondary | ICD-10-CM | POA: Diagnosis not present

## 2023-01-06 DIAGNOSIS — G4733 Obstructive sleep apnea (adult) (pediatric): Secondary | ICD-10-CM | POA: Diagnosis not present

## 2023-01-06 DIAGNOSIS — Z1211 Encounter for screening for malignant neoplasm of colon: Secondary | ICD-10-CM | POA: Diagnosis not present

## 2023-01-06 DIAGNOSIS — E119 Type 2 diabetes mellitus without complications: Secondary | ICD-10-CM | POA: Diagnosis not present

## 2023-01-06 MED ORDER — SODIUM CHLORIDE 0.9 % IV SOLN: 500.0000 mL | Freq: Once | INTRAVENOUS | Status: DC

## 2023-01-06 NOTE — Progress Notes (Deleted)
Called to room to assist during endoscopic procedure.  Patient ID and intended procedure confirmed with present staff. Received instructions for my participation in the procedure from the performing physician.  

## 2023-01-06 NOTE — [facility] Notes (Signed)
Called to room to assist during endoscopic procedure.  Patient ID and intended procedure confirmed with present staff. Received instructions for my participation in the procedure from the performing physician.  

## 2023-01-06 NOTE — Telephone Encounter (Signed)
Patient advised that Dr. Adela Lank wants her  to come in for the colonoscopy and receive an enema prior to the procedure. Patient agrees to  POC

## 2023-01-06 NOTE — Patient Instructions (Signed)
YOU HAD AN ENDOSCOPIC PROCEDURE TODAY AT THE [facility]:   Refer to the procedure report that was given to you for any specific questions about what was found during the examination.  If the procedure report does not answer your questions, please call your gastroenterologist to clarify.  If you requested that your care partner not be given the details of your procedure findings, then the procedure report has been included in a sealed envelope for you to review at your convenience later.  **Handouts given on polyps and hemorrhoids**  YOU SHOULD EXPECT: Some feelings of bloating in the abdomen. Passage of more gas than usual.  Walking can help get rid of the air that was put into your GI tract during the procedure and reduce the bloating. If you had a lower endoscopy (such as a colonoscopy or flexible sigmoidoscopy) you may notice spotting of blood in your stool or on the toilet paper. If you underwent a bowel prep for your procedure, you may not have a normal bowel movement for a few days.  Please Note:  You might notice some irritation and congestion in your nose or some drainage.  This is from the oxygen used during your procedure.  There is no need for concern and it should clear up in a day or so.  SYMPTOMS TO REPORT IMMEDIATELY:  Following lower endoscopy (colonoscopy or flexible sigmoidoscopy):  Excessive amounts of blood in the stool  Significant tenderness or worsening of abdominal pains  Swelling of the abdomen that is new, acute  Fever of 100F or higher  For urgent or emergent issues, a gastroenterologist can be reached at any hour by calling [phone]. Do not use MyChart messaging for urgent concerns.    DIET:  We do recommend a small meal at first, but then you may proceed to your regular diet.  Drink plenty of fluids but you should avoid alcoholic beverages for 24 hours.  ACTIVITY:  You should plan to take it easy for the rest of today and you should NOT DRIVE or  use heavy machinery until tomorrow (because of the sedation medicines used during the test).    FOLLOW UP: Our staff will call the number listed on your records the next business day following your procedure.  We will call around 7:15- 8:00 am to check on you and address any questions or concerns that you may have regarding the information given to you following your procedure. If we do not reach you, we will leave a message.     If any biopsies were taken you will be contacted by phone or by letter within the next 1-3 weeks.  Please call us at [phone] if you have not heard about the biopsies in 3 weeks.    SIGNATURES/CONFIDENTIALITY: You and/or your care partner have signed paperwork which will be entered into your electronic medical record.  These signatures attest to the fact that that the information above on your After Visit Summary has been reviewed and is understood.  Full responsibility of the confidentiality of this discharge information lies with you and/or your care-partner.  

## 2023-01-06 NOTE — [facility] Notes (Signed)
Sedate, gd SR, tolerated procedure well, VSS, report to RN 

## 2023-01-06 NOTE — Telephone Encounter (Signed)
PT is scheduled for colonoscopy today at 4pm and after prepping, her stools are still very dark. Please advise.

## 2023-01-06 NOTE — Op Note (Signed)
[facility] Patient Name: Maria Hudson Procedure Date: 01/06/2023 3:59 PM MRN: [id number] Endoscopist: Viviann Spare P. Adela Lank , MD, [id number] Age: 62 Referring MD:  Date of Birth: [birth year] Gender: Female Account #: [id number] Procedure:                Colonoscopy Indications:              High risk colon cancer surveillance: Personal                            history of colonic polyps - last exam at [facility] - 2                            small polyps. Chronic constipation. Initially                            scheduled for 7/22 but regular prep inadequate. She                            drank additional Golytely / Miralax prep for this                            exam Medicines:                Monitored Anesthesia Care Procedure:                Pre-Anesthesia Assessment:                           - Prior to the procedure, a History and Physical                            was performed, and patient medications and                            allergies were reviewed. The patient's tolerance of                            previous anesthesia was also reviewed. The risks                            and benefits of the procedure and the sedation                            options and risks were discussed with the patient.                            All questions were answered, and informed consent                            was obtained. Prior Anticoagulants: The patient has                            taken no anticoagulant or antiplatelet agents. ASA  Grade Assessment: III - A patient with severe                            systemic disease. After reviewing the risks and                            benefits, the patient was deemed in satisfactory                            condition to undergo the procedure.                           After obtaining informed consent, the colonoscope                            was passed under direct vision. Throughout the                             procedure, the patient's blood pressure, pulse, and                            oxygen saturations were monitored continuously. The                            Olympus CF-HQ190L ([id number]) Colonoscope was                            introduced through the anus and advanced to the the                            cecum, identified by appendiceal orifice and                            ileocecal valve. The colonoscopy was performed                            without difficulty. The patient tolerated the                            procedure well. The quality of the bowel                            preparation was good except the cecum was fair. The                            ileocecal valve, appendiceal orifice, and rectum                            were photographed. Scope In: 4:03:36 PM Scope Out: 4:36:29 PM Scope Withdrawal Time: 0 hours 24 minutes 15 seconds  Total Procedure Duration: 0 hours 32 minutes 53 seconds  Findings:                 The perianal and digital rectal examinations were  normal.                           Three flat polyps were found in the transverse                            colon. The polyps were 4 to 5 mm in size. These                            polyps were removed with a cold snare. Resection                            and retrieval were complete.                           A 3 to 4 mm polyp was found in the very distal                            rectum, just proximal to the dentate line. The                            polyp was sessile. The polyp was removed with a                            cold snare. Resection and retrieval were complete.                           A large amount of liquid stool was found in the                            entire colon, making visualization difficult.                            Lavage of the colon was performed using copious                            amounts of sterile water,  resulting in mostly                            adequate clearance with with exception of a portion                            of the cecal cap (distal portion). There was                            residual fibrinous debris there that clogged the                            scope multiple times when trying to clear it. I                            could not clear the entire cecum but most of it was  visualized. Small portions of the ascending colon /                            splenic flexure also somewhat limited due to                            residual debris but mostly adequate views obtained                            there. No high risk or large lesions noted.                           Internal hemorrhoids were found during retroflexion.                           The exam was otherwise without abnormality. Complications:            No immediate complications. Estimated blood loss:                            Minimal. Estimated Blood Loss:     Estimated blood loss was minimal. Impression:               - Three 4 to 5 mm polyps in the transverse colon,                            removed with a cold snare. Resected and retrieved.                           - One 3 to 4 mm polyp in the distal rectum, removed                            with a cold snare. Resected and retrieved.                           - Stool in the entire examined colon, extensive                            lavage performed as above - mostly cleared with                            exception of portion of cecum and very small areas                            of ascending colon and splenic flexure.                           - Internal hemorrhoids.                           - The examination was otherwise normal. Recommendation:           - Patient has a contact number available for  emergencies. The signs and symptoms of potential                            delayed  complications were discussed with the                            patient. Return to normal activities tomorrow.                            Written discharge instructions were provided to the                            patient.                           - Resume previous diet.                           - Continue present medications.                           - Await pathology results with further                            recommendations. Follow up at next available office                            visit with Dr. Myrtie Neither (primary GI) to discuss                            management of chronic constipation Willaim Rayas. Joran Kallal, MD 01/06/2023 4:48:43 PM This report has been signed electronically.

## 2023-01-06 NOTE — [facility] Notes (Signed)
Pt's states no medical or surgical changes since previsit or office visit. 

## 2023-01-06 NOTE — [facility] Notes (Signed)
Recheck Blood glucose 96.

## 2023-01-06 NOTE — [facility] Notes (Signed)
[facility] History and Physical   Primary Care Physician:  Fleet Contras, MD   Reason for Procedure:   History of colon polyps  Plan:    colonoscopy     HPI: Maria Hudson is a 62 y.o. female  here for colonoscopy surveillance - history of polyps removed at [facility] on her last exam 10/2015.   Patient has chronic constipation - was seen her on 7/22 for her colonoscopy but prep was poor, did not attempt the procedure and was rescheduled for today following administration of more prep. She states her prep cleared today after a significant amount of prep and other laxatives. Of note she does have an AV fistula in place but has never been on dialysis.   No family history of colon cancer known. Otherwise feels well without any cardiopulmonary symptoms.   I have discussed risks / benefits of anesthesia and endoscopic procedure with Maria Hudson and they wish to proceed with the exams as outlined today.    Past Medical History:  Diagnosis Date   Acute on chronic diastolic heart failure (HCC) 03/05/2021   Anemia    Arthritis    CAD (coronary artery disease)    CHF (congestive heart failure) (HCC)    CKD (chronic kidney disease) stage 4, GFR 15-29 ml/min (HCC) 03/05/2021   Colon polyps    CVA (cerebral vascular accident) (HCC)    Depression    Diabetes mellitus without complication (HCC)    type 2   Dyspnea    Hypertension    Hypertensive urgency 04/14/2021   Memory loss    mild   OSA (obstructive sleep apnea) 05/27/2021   uses CPAP 12-18 cm   Pneumonia    several times   Pure hypercholesterolemia 05/27/2021   Renal disorder    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   Stroke [facility])     Past Surgical History:  Procedure Laterality Date   ABDOMINAL HYSTERECTOMY     AV FISTULA PLACEMENT Left 12/09/2021   Procedure: LEFT ARTERIOVENOUS (AV) FISTULA CREATION;  Surgeon: Maeola Harman, MD;  Location: [facility] OR;  Service: Vascular;  Laterality: Left;   BIOPSY   09/08/2021   Procedure: BIOPSY;  Surgeon: Sherrilyn Rist, MD;  Location: [facility];  Service: Gastroenterology;;   CESAREAN SECTION     x 1   ESOPHAGOGASTRODUODENOSCOPY (EGD) WITH PROPOFOL N/A 09/08/2021   Procedure: ESOPHAGOGASTRODUODENOSCOPY (EGD) WITH PROPOFOL;  Surgeon: Sherrilyn Rist, MD;  Location: [facility];  Service: Gastroenterology;  Laterality: N/A;  chest pain, epigastric pain   INNER EAR SURGERY Bilateral    RIGHT HEART CATH N/A 05/14/2021   Procedure: RIGHT HEART CATH;  Surgeon: Laurey Morale, MD;  Location: [facility] INVASIVE CV LAB;  Service: Cardiovascular;  Laterality: N/A;   TONSILLECTOMY     age 35    Prior to Admission medications   Medication Sig Start Date End Date Taking? Authorizing Provider  Accu-Chek Softclix Lancets lancets 3 (three) times daily. 06/26/21  Yes [provider]  acetaminophen (TYLENOL) 500 MG tablet Take 1,000 mg by mouth every 6 (six) hours as needed for moderate pain or headache.   Yes [provider]  allopurinol (ZYLOPRIM) 100 MG tablet Take 100 mg by mouth in the morning. 01/27/21  Yes [provider]  amLODipine (NORVASC) 10 MG tablet Take 10 mg by mouth daily. 02/25/22  Yes [provider]  aspirin EC 81 MG tablet Take 81 mg by mouth in the morning. Swallow whole.   Yes [provider]  atorvastatin (LIPITOR) 40 MG tablet Take 1 tablet (40 mg total) by mouth every evening. 05/27/21  Yes Chilton Si, MD  buPROPion (WELLBUTRIN) 100 MG tablet Take 100 mg by mouth 2 (two) times daily. 02/04/21  Yes [provider]  carvedilol (COREG) 25 MG tablet Take 1 tablet (25 mg total) by mouth 2 (two) times daily with a meal. 03/07/21  Yes [name], York Ram, MD  cholecalciferol (VITAMIN D3) 25 MCG (1000 UNIT) tablet Take 2,000 Units by mouth daily.   Yes [provider]  cloNIDine (CATAPRES) 0.3 MG tablet Take 0.3 mg by mouth 3 (three) times daily.   Yes [provider]   dapagliflozin propanediol (FARXIGA) 10 MG TABS tablet Take 1 tablet (10 mg total) by mouth daily before breakfast. 04/20/22  Yes Milford, Anderson Malta, FNP  hydrALAZINE (APRESOLINE) 100 MG tablet TAKE 1 TABLET BY MOUTH 3 TIMES DAILY. 05/22/22  Yes Chilton Si, MD  isosorbide mononitrate (IMDUR) 120 MG 24 hr tablet Take 120 mg by mouth daily.   Yes [provider]  linaclotide Karlene Einstein) 145 MCG CAPS capsule Take 1 capsule (145 mcg total) by mouth daily before breakfast. 04/20/22  Yes Zehr, Princella Pellegrini, PA-C  loratadine (CLARITIN) 10 MG tablet Take 10 mg by mouth in the morning. 02/14/21  Yes [provider]  pregabalin (LYRICA) 25 MG capsule Take 25 mg by mouth 2 (two) times daily. 02/03/21  Yes [provider]  sertraline (ZOLOFT) 50 MG tablet Take 50 mg by mouth in the morning. 02/01/21  Yes [provider]  spironolactone (ALDACTONE) 50 MG tablet Take 50 mg by mouth daily. 03/11/22  Yes [provider]  torsemide (DEMADEX) 20 MG tablet Take 1 tablet (20 mg total) by mouth daily as needed. 04/20/22  Yes Milford, Anderson Malta, FNP  traZODone (DESYREL) 50 MG tablet Take 50 mg by mouth at bedtime. 04/23/21  Yes [provider]  Continuous Blood Gluc Transmit (DEXCOM G6 TRANSMITTER) MISC Use one every 3 month 05/25/22   [provider]  ferrous sulfate 325 (65 FE) MG tablet Take 325 mg by mouth 2 (two) times daily. 05/31/21   [provider]  fluticasone (FLONASE) 50 MCG/ACT nasal spray Place 1 spray into both nostrils daily as needed for allergies or rhinitis.    [provider]  insulin glargine (LANTUS) 100 unit/mL SOPN Inject 10 Units into the skin at bedtime.    [provider]  Multiple Vitamin (MULTIVITAMIN ADULT) TABS Take 1 tablet by mouth in the morning.    [provider]  nitroGLYCERIN (NITROSTAT) 0.4 MG SL tablet Place 1 tablet (0.4 mg total) under the tongue every 5 (five) minutes as needed for chest  pain. 03/07/21   [name], York Ram, MD  NOVOLOG FLEXPEN 100 UNIT/ML FlexPen Inject 5 Units into the skin 3 (three) times daily with meals. 01/12/21   [provider]  Omega-3 Fatty Acids (FISH OIL) 1200 MG CAPS Take 1,200 mg by mouth in the morning.    [provider]  Semaglutide, 2 MG/DOSE, (OZEMPIC, 2 MG/DOSE,) 8 MG/3ML SOPN Inject 2 mg into the skin once a week. 11/12/22   Pavero, Cristal Deer, RPH  Sodium Phosphates (FLEET SALINE ENEMA) 7-19 GM/197ML ENEM Place 4.5 fluid ounces rectally once. [id number]; lot# [id number]; exp. 2027/02    [provider]  TURMERIC CURCUMIN PO Take 500 mg by mouth daily at 6 (six) AM.    [provider]    Current Outpatient Medications  Medication Sig Dispense Refill  Accu-Chek Softclix Lancets lancets 3 (three) times daily.     acetaminophen (TYLENOL) 500 MG tablet Take 1,000 mg by mouth every 6 (six) hours as needed for moderate pain or headache.     allopurinol (ZYLOPRIM) 100 MG tablet Take 100 mg by mouth in the morning.     amLODipine (NORVASC) 10 MG tablet Take 10 mg by mouth daily.     aspirin EC 81 MG tablet Take 81 mg by mouth in the morning. Swallow whole.     atorvastatin (LIPITOR) 40 MG tablet Take 1 tablet (40 mg total) by mouth every evening. 90 tablet 3   buPROPion (WELLBUTRIN) 100 MG tablet Take 100 mg by mouth 2 (two) times daily.     carvedilol (COREG) 25 MG tablet Take 1 tablet (25 mg total) by mouth 2 (two) times daily with a meal. 60 tablet 0   cholecalciferol (VITAMIN D3) 25 MCG (1000 UNIT) tablet Take 2,000 Units by mouth daily.     cloNIDine (CATAPRES) 0.3 MG tablet Take 0.3 mg by mouth 3 (three) times daily.     dapagliflozin propanediol (FARXIGA) 10 MG TABS tablet Take 1 tablet (10 mg total) by mouth daily before breakfast. 30 tablet 11   hydrALAZINE (APRESOLINE) 100 MG tablet TAKE 1 TABLET BY MOUTH 3 TIMES DAILY. 270 tablet 2   isosorbide mononitrate (IMDUR) 120 MG 24 hr tablet Take 120 mg by mouth  daily.     linaclotide (LINZESS) 145 MCG CAPS capsule Take 1 capsule (145 mcg total) by mouth daily before breakfast. 30 capsule 11   loratadine (CLARITIN) 10 MG tablet Take 10 mg by mouth in the morning.     pregabalin (LYRICA) 25 MG capsule Take 25 mg by mouth 2 (two) times daily.     sertraline (ZOLOFT) 50 MG tablet Take 50 mg by mouth in the morning.     spironolactone (ALDACTONE) 50 MG tablet Take 50 mg by mouth daily.     torsemide (DEMADEX) 20 MG tablet Take 1 tablet (20 mg total) by mouth daily as needed. 90 tablet 3   traZODone (DESYREL) 50 MG tablet Take 50 mg by mouth at bedtime.     Continuous Blood Gluc Transmit (DEXCOM G6 TRANSMITTER) MISC Use one every 3 month     ferrous sulfate 325 (65 FE) MG tablet Take 325 mg by mouth 2 (two) times daily.     fluticasone (FLONASE) 50 MCG/ACT nasal spray Place 1 spray into both nostrils daily as needed for allergies or rhinitis.     insulin glargine (LANTUS) 100 unit/mL SOPN Inject 10 Units into the skin at bedtime.     Multiple Vitamin (MULTIVITAMIN ADULT) TABS Take 1 tablet by mouth in the morning.     nitroGLYCERIN (NITROSTAT) 0.4 MG SL tablet Place 1 tablet (0.4 mg total) under the tongue every 5 (five) minutes as needed for chest pain. 20 tablet 0   NOVOLOG FLEXPEN 100 UNIT/ML FlexPen Inject 5 Units into the skin 3 (three) times daily with meals.     Omega-3 Fatty Acids (FISH OIL) 1200 MG CAPS Take 1,200 mg by mouth in the morning.     Semaglutide, 2 MG/DOSE, (OZEMPIC, 2 MG/DOSE,) 8 MG/3ML SOPN Inject 2 mg into the skin once a week. 3 mL 6   Sodium Phosphates (FLEET SALINE ENEMA) 7-19 GM/197ML ENEM Place 4.5 fluid ounces rectally once. [id number]; lot# [id number]; exp. 2027/02     TURMERIC CURCUMIN PO Take 500 mg by mouth daily at 6 (six) AM.  Current Facility-Administered Medications  Medication Dose Route Frequency Provider Last Rate Last Admin   0.9 %  sodium chloride infusion  500 mL Intravenous Continuous Danis, Starr Lake III, MD        0.9 %  sodium chloride infusion  500 mL Intravenous Once Travian Kerner, Willaim Rayas, MD       sodium phosphate (FLEET) 7-19 GM/118ML enema 1 enema  1 enema Rectal Once Sherrilyn Rist, MD        Allergies as of 01/06/2023 - Review Complete 01/06/2023  Allergen Reaction Noted   Metformin and related Nausea Only 09/30/2021   Other  12/24/2021   Hydrocodone Itching 05/28/2015   Oxycodone-acetaminophen Itching 02/25/2012    Family History  Problem Relation Age of Onset   Hypertension Mother    Stroke Mother    Diabetes Mother    Other Mother        enlarged heart   Hypertension Father    Hypertension Sister    Diabetes Sister    CAD Sister        enlarged heart   Kidney disease Sister    Diabetes Paternal Grandmother    Hypertension Daughter    Diabetes Daughter    Hypertension Son    Diabetes Son    Breast cancer Paternal Aunt     Social History   Socioeconomic History   Marital status: Divorced    Spouse name: Not on file   Number of children: 2   Years of education: Not on file   Highest education level: Some college, no degree  Occupational History   Occupation: retired   Occupation: disabled  Tobacco Use   Smoking status: Never    Passive exposure: Never   Smokeless tobacco: Never  Vaping Use   Vaping status: Never Used  Substance and Sexual Activity   Alcohol use: Never   Drug use: Never   Sexual activity: Not on file  Other Topics Concern   Not on file  Social History Narrative   Not on file   Social Determinants of Health   Financial Resource Strain: Low Risk  (08/04/2022)   Overall Financial Resource Strain (CARDIA)    Difficulty of Paying Living Expenses: Not very hard  Food Insecurity: No Food Insecurity (11/05/2022)   Hunger Vital Sign    Worried About Running Out of Food in the Last Year: Never true    Ran Out of Food in the Last Year: Never true  Transportation Needs: No Transportation Needs (08/04/2022)   PRAPARE - Scientist, research (physical sciences) (Medical): No    Lack of Transportation (Non-Medical): No  Physical Activity: Inactive (10/06/2022)   Exercise Vital Sign    Days of Exercise per Week: 0 days    Minutes of Exercise per Session: 0 min  Stress: No Stress Concern Present (11/02/2022)   [employer] of Occupational Health - Occupational Stress Questionnaire    Feeling of Stress : Only a little  Recent Concern: Stress - Stress Concern Present (08/19/2022)   [employer] of Occupational Health - Occupational Stress Questionnaire    Feeling of Stress : To some extent  Social Connections: Moderately Isolated (09/03/2022)   Social Connection and Isolation Panel [NHANES]    Frequency of Communication with Friends and Family: More than three times a week    Frequency of Social Gatherings with Friends and Family: More than three times a week    Attends Religious Services: More than 4 times per year  Active Member of Clubs or Organizations: No    Attends Banker Meetings: Never    Marital Status: Divorced  Catering manager Violence: Not At Risk (10/06/2022)   Humiliation, Afraid, Rape, and Kick questionnaire    Fear of Current or Ex-Partner: No    Emotionally Abused: No    Physically Abused: No    Sexually Abused: No    Review of Systems: All other review of systems negative except as mentioned in the HPI.  Physical Exam: Vital signs BP (!) 158/81   Pulse 68   Temp 98.9 F (37.2 C)   Ht 5\' 3"  (1.6 m)   Wt 208 lb (94.3 kg)   SpO2 96%   BMI 36.85 kg/m   General:   Alert,  Well-developed, pleasant and cooperative in NAD Lungs:  Clear throughout to auscultation.   Heart:  Regular rate and rhythm Abdomen:  Soft, nontender and nondistended.   Neuro/Psych:  Alert and cooperative. Normal mood and affect. A and O x 3  Harlin Rain, MD [facility]

## 2023-01-07 ENCOUNTER — Telehealth: Payer: Self-pay

## 2023-01-07 ENCOUNTER — Other Ambulatory Visit (HOSPITAL_COMMUNITY): Payer: Self-pay

## 2023-01-07 DIAGNOSIS — F4321 Adjustment disorder with depressed mood: Secondary | ICD-10-CM | POA: Diagnosis not present

## 2023-01-07 NOTE — Telephone Encounter (Signed)
Pharmacy Patient Advocate Encounter   Received notification from CoverMyMeds that prior authorization for [facility] is required/requested.   Insurance verification completed.   The patient is insured through [facility] [place] .   Per test claim: PA required; PA submitted to [facility] [place] Medicaid via CoverMyMeds Key/confirmation #/EOC [id number] Status is pending

## 2023-01-07 NOTE — Telephone Encounter (Signed)
  Follow up Call-     01/06/2023    3:38 PM 01/04/2023    1:32 PM  Call back number  Post procedure Call Back phone  # [phone]  Permission to leave phone message Yes      Patient questions:  Do you have a fever, pain , or abdominal swelling? No. Pain Score  0 *  Have you tolerated food without any problems? Yes.    Have you been able to return to your normal activities? Yes.    Do you have any questions about your discharge instructions: Diet   No. Medications  No. Follow up visit  No.  Do you have questions or concerns about your Care? No.  Actions: * If pain score is 4 or above: No action needed, pain <4.

## 2023-01-07 NOTE — Telephone Encounter (Signed)
Pharmacy Patient Advocate Encounter  Received notification from [facility] Medicaid that Prior Authorization for [facility] has been APPROVED from 12/24/22 to 01/07/24. Ran test claim, Copay is $4.

## 2023-01-08 ENCOUNTER — Other Ambulatory Visit: Payer: Medicaid Other | Admitting: Obstetrics and Gynecology

## 2023-01-08 ENCOUNTER — Encounter: Payer: Self-pay | Admitting: Obstetrics and Gynecology

## 2023-01-08 NOTE — Patient Instructions (Signed)
Hey Maria Hudson, I am glad you are doing well today-have a wonderful day and weekend!!  Maria Hudson was given information about Medicaid Managed Care team care coordination services as a part of their [facility] Medicaid benefit. Maria Hudson verbally consented to engagement with the [facility] Managed Care team.   If you are experiencing a medical emergency, please call 911 or report to your local emergency department or urgent care.   If you have a non-emergency medical problem during routine business hours, please contact your provider's office and ask to speak with a nurse.   For questions related to your [facility] health plan, please call: [phone] or go here:[email or web address]  If you would like to schedule transportation through your [facility] plan, please call the following number at least 2 days in advance of your appointment: [phone].   You can also use the MTM portal or MTM mobile app to manage your rides. Reimbursement for transportation is available through [facility]! For the portal, please go to [email or web address]  Call the [facility] Crisis Line at [phone], at any time, 24 hours a day, 7 days a week. If you are in danger or need immediate medical attention call 911.  If you would like help to quit smoking, call 1-800-QUIT-NOW ((947) 655-0493) OR Espaol: 1-855-Djelo-Ya (2-725-366-4403) o para ms informacin haga clic aqu or Text READY to 474-259 to register via text  Maria Hudson - following are the goals we discussed in your visit today:   Goals Addressed    Timeframe:  Long-Range Goal Priority:  High Start Date:     11/12/21                        Expected End Date:  ongoing                     Follow Up Date: 02/08/23   - schedule appointment for vaccines needed due to my age or health - schedule recommended health tests (blood work, mammogram, colonoscopy, pap test) - schedule and keep appointment for annual check-up    Why is this  important?   Screening tests can find diseases early when they are easier to treat.  Your doctor or nurse will talk with you about which tests are important for you.  Getting shots for common diseases like the flu and shingles will help prevent them.  01/08/23:  Colonoscopy completed 7/15.  To schedule CARDS and ORTHO appt-to f/u on ENT referral. [facility] weekly visits  Patient verbalizes understanding of instructions and care plan provided today and agrees to view in MyChart. Active MyChart status and patient understanding of how to access instructions and care plan via MyChart confirmed with patient.     The Managed Medicaid care management team will reach out to the patient again over the next 30 business  days.  The  Patient  has been provided with contact information for the Managed Medicaid care management team and has been advised to call with any health related questions or concerns.   Kathi Der RN, BSN [place]  [facility] Care Management Coordinator - Managed Medicaid High Risk [phone]   Following is a copy of your plan of care:  Care Plan : RN Care Manager Plan Of Care  Updates made by [name], RN since 01/08/2023 12:00 AM     Problem: Knowledge Deficit and Care Coordination Needs Related to Management of HTN, HF, Type 2 DM   Priority: High  Long-Range Goal: Development of Plan of Care to Address Knowledge Deficits and Care Coordination Needs related to  management of CHF, CAD, HTN, HLD, DMII, CKD Stage 4, and Depression   Start Date: 05/06/2021  Expected End Date: 04/10/2023  Priority: High  Note:   Current Barriers:  Knowledge Deficits related to plan of care for management of CHF, CAD, HTN, HLD, DMII, CKD Stage 4, and Depression 01/08/23:  Colonoscopy completed.  Will schedule CARDS and ORTHO appt-labile BP and left shoulder pain continue.  BS stable, 90-168. [facility] appointments continue.  No change in chest pain.  To f/u  on ENT referral.  RNCM Clinical Goal(s):  Patient will demonstrate ongoing adherence to prescribed treatment plan for CHF, CAD, HTN, HLD, DMII, CKD Stage 4, and Depression as evidenced by patient report of improved health and quality of life and no unplanned ED or unplanned hospital admissions continue to work with RN Care Manager and/or Social Worker to address care management and care coordination needs related to CHF, CAD, HTN, HLD, DMII, CKD Stage 4, and Depression as evidenced by adherence to CM Team Scheduled appointments      Work with [facility] LCSW and psychiatrist to treat depression  Interventions: Inter-disciplinary care team collaboration (see longitudinal plan of care) Evaluation of current treatment plan related to  self management and patient's adherence to plan as established by provider Mailed summary of benefits for her [facility] Managed Medicaid plan to her home address Collaborated with Care Guide for dental resources-completed. Care Guide referral for dental resources Collaborated with BSW for assistance with [facility] appt.-completed. BSW referral for appt assistance. Collaborated with Pharmacy regarding  uncontrolled HTN Pharmacy referral for uncontrolled HTN-completed. Collaborated with LCSW for depression LCSW referral for depression-completed BSW referral for dental and PCP resources-completed. Collaborated with BSW  CAD Interventions: (Status:  New goal.) Long Term Goal Assessed understanding of CAD diagnosis Medications reviewed including medications utilized in CAD treatment plan Provided education on importance of blood pressure control in management of CAD Counseled on importance of regular laboratory monitoring as prescribed Reviewed Importance of taking all medications as prescribed Reviewed Importance of attending all scheduled provider appointments Advised to report any changes in symptoms or exercise tolerance Assessed social determinant of health  barriers  Hyperlipidemia Interventions:  (Status:  New goal.) Long Term Goal Medication review performed; medication list updated in electronic medical record.  Provider established cholesterol goals reviewed Counseled on importance of regular laboratory monitoring as prescribed Reviewed importance of limiting foods high in cholesterol Assessed social determinant of health barriers     Heart Failure Interventions:  (Status: Goal on Track (progressing): YES.)  Long Term Goal - patient states her fluid retention has significantly improved,  says she cannot tolerate the compression stockings because they are too tight and make her legs hurt Assessed patient's heart failure management strategies  Diabetes:  (Status: Goal on Track (progressing): YES.) Long Term Goal   Lab Results  Component Value Date   HGBA1C 6.7 (H) 04/29/2021   Hgn A1C=6.3 on 12/26/21 Hgb A1C=5.2 in October per patient HGBA1C=5.8 in March per patient HGBA1C=6.1 on 12/06/24  Assessed patient's understanding of A1C goal: <7% Reviewed medications with patient and discussed importance of medication adherence;        Counseled on importance of regular laboratory monitoring as prescribed;        Discussed plans with patient for ongoing care management follow up and provided patient with direct contact information for care management team;  Advised patient, providing education and rationale, to check cbg three times daily and record        call provider for findings outside established parameters;       Review of patient status, including review of consultants reports, relevant laboratory and other test results, and medications completed;       Discussed addition of Ozempic to medication regimen , reviewed mechanism of action and proven cardiovascular benefits To schedule ENDO appt-discussed nutrition referral, [facility], [facility]-ENDO appt scheduled-patient declines referral.  Hypertension and CKD stage 4  : (Status: Goal on Track (progressing): YES.) Long Term Goal-  has been placed on kidney transplant waiting list, fistu;a placed 12/09/21,ready for use 03/11/22. Last practice recorded BP readings:  BP 04/01/22-145/87 BP 05/01/22-141/70 BP 08/04/22-155/70 BP 11/05/22-152/76  BP Readings from Last 3 Encounters:  09/30/21 120/70  09/12/21 (!) 107/58  09/08/21 (!) 144/71  12/09/22         136-148/62-72 01/08/23         126-178/66-85    Most recent eGFR/CrCl:    No components found for: CRCL Lab Results  Component Value Date   NA 136 08/19/2021   K 3.9 08/19/2021   CREATININE 2.51 (H) 08/19/2021   EGFR 19 (L) 03/13/2021   GFRNONAA 22 (L) 08/19/2021   GLUCOSE 122 (H) 08/19/2021    Evaluation of current treatment plan related to hypertension self management and patient's adherence to plan as established by provider;   Reviewed medications with patient and discussed importance of compliance;  Discussed plans with patient for ongoing care management follow up and provided patient with direct contact information for care management team; Advised patient, providing education and rationale, to monitor blood pressure daily and record, calling PCP for findings outside established parameters;  Reviewed scheduled/upcoming provider appointments   Weight Loss Interventions:  (Status:  New goal.) Long Term Goal--patient has started Ozempic Discussed indications, mechanism of action, common side effects and CV benefits  of GLP1 Ozempic Assessed tolerance of Ozempic 08/04/22-patient states she has gained 7 pounds this month because she is not eating well-discussed nutrition referral, exercise, and [facility]-patient states she will check on these. 09/03/22: referrals declined 12/08/22: 209, medications adjusted, Ozempic 2mg , Novolog and Lantus adjusted  Interdisciplinary Collaboration:  (Status: Goal on Track (progressing): YES.) Long Term Goal  Collaborated with BSW to initiate plan  of care to address needs related to Housing barriers, Inability to perform ADL's independently, and Inability to perform IADL's independently in patient with CHF, CAD, HTN, HLD, DM, CKD Stage 4, and Depression Assessed status of patient's involvement with behavioral health counselor Collaboration with pharmacist for medication review, discussion of ? Medication side effects causing chronic constipation and arranging for patient to received home delivery of medications in blister packs  Patient Goals/Self-Care Activities: Take medications as prescribed   Attend all scheduled provider appointments Call pharmacy for medication refills 3-7 days in advance of running out of medications Attend church or other social activities Perform all self care activities independently  Call provider office for new concerns or questions  Work with the social worker to address care coordination needs and will continue to work with the clinical team to address health care and disease management related needs call office if I gain more than 2 pounds in one day or 5 pounds in one week keep legs up while sitting track weight in diary use salt in moderation watch for swelling in feet, ankles and legs every day weigh myself daily know  when to call the doctorfor fluid weight gain or consistently abnormal BP readings at home track symptoms and what helps feel better or worse check blood sugar at prescribed times: three times daily enter blood sugar readings and medication or insulin into daily log take the blood sugar log to all doctor visits take the blood sugar meter to all doctor visits eat fish at least once per week fill half of plate with vegetables manage portion size check blood pressure daily write blood pressure results in a log or diary learn about high blood pressure take blood pressure log to all doctor appointments call doctor for signs and symptoms of high blood pressure keep all doctor  appointments take medications for blood pressure exactly as prescribed Limit fluid intake to 1 liter per day Collaborate with managed Medicaid team of BSW and pharmacist to address identified needs Work with cardiology pharmacist to assist with BP stabilization Work with [facility] LCSW and psychiatrist to treat depression Review summary of [employer] benefits mailed to home address and call to enroll in program that will benefit your chronic health issues Continue to use [facility] Care Management calendar to record weight , BP and CBGs so readings are all in one place Schedule dental appt Schedule ENDO appt-completed. Patient to follow up on ENT referral

## 2023-01-08 NOTE — Patient Outreach (Signed)
Medicaid Managed Care   Nurse Care Manager Note  01/08/2023 Name:  Maria Hudson MRN:  [id number] DOB:  Oct 16, 1960  Maria Hudson is an 62 y.o. year old female who is a primary patient of Fleet Contras, MD.  The Medicaid Managed Care Coordination team was consulted for assistance with:    Chronic healthcare management needs, HTN, retinopathy, chronic pain, depression, gout, CKD, DM, CAD, CHF, OSA, constipation, chest pain  [name] was given information about Medicaid Managed Care Coordination team services today. Maria Hudson Patient agreed to services and verbal consent obtained.  [name] for follow up visit in response to provider referral for case management and/or care coordination services.   Assessments/Interventions:  Review of past medical history, allergies, medications, health status, including review of consultants reports, laboratory and other test data, was performed as part of comprehensive evaluation and provision of chronic care management services.  SDOH (Social Determinants of Health) assessments and interventions performed: SDOH Interventions    Flowsheet Row Patient Outreach Telephone from 01/08/2023 in [place] POPULATION HEALTH DEPARTMENT Patient Outreach Telephone from 12/09/2022 in [place] POPULATION HEALTH DEPARTMENT Patient Outreach Telephone from 11/05/2022 in [place] POPULATION HEALTH DEPARTMENT Patient Outreach Telephone from 11/02/2022 in [place] POPULATION HEALTH DEPARTMENT Patient Outreach Telephone from 10/06/2022 in [place] POPULATION HEALTH DEPARTMENT Patient Outreach Telephone from 09/22/2022 in [place] POPULATION HEALTH DEPARTMENT  SDOH Interventions        Food Insecurity Interventions -- -- Intervention Not Indicated -- -- --  Housing Interventions -- Intervention Not Indicated -- -- -- --  Transportation Interventions Intervention Not Indicated -- -- -- -- --  Alcohol Usage Interventions -- -- Intervention  Not Indicated (Score <7) -- -- --  Depression Interventions/Treatment  -- Counseling, Medication  [patient speaks with [facility] every week] -- -- -- --  Physical Activity Interventions -- -- -- -- Other (Comments)  [not phyasically able to enage in a moderate to strenuous exercise routine] --  Stress Interventions -- -- -- [employer], Provide Counseling -- [employer], Provide Counseling  Health Literacy Interventions Intervention Not Indicated -- -- -- -- --     Care Plan  Allergies  Allergen Reactions   Metformin And Related Nausea Only   Other     Clonidine patch causes skin irritation    Hydrocodone Itching    Patient able to tolerate when taken with Benadryl.   Oxycodone-Acetaminophen Itching    Patient able to tolerate when taken with Benadryl.   Medications Reviewed Today     Reviewed by [name], RN (Registered Nurse) on 01/08/23 at 1044  Med List Status: <None>   Medication Order Taking? Sig Documenting Provider Last Dose Status Informant  Accu-Chek Softclix Lancets lancets [id number] No 3 (three) times daily. [provider] 01/05/2023 Active Self  acetaminophen (TYLENOL) 500 MG tablet [id number] No Take 1,000 mg by mouth every 6 (six) hours as needed for moderate pain or headache. [provider] 01/05/2023 Active Self  allopurinol (ZYLOPRIM) 100 MG tablet [id number] No Take 100 mg by mouth in the morning. [provider] 01/06/2023 Active Self  amLODipine (NORVASC) 10 MG tablet [id number] No Take 10 mg by mouth daily. [provider] 01/06/2023 Active   aspirin EC 81 MG tablet [id number] No Take 81 mg by mouth in the morning. Swallow whole. [provider] 01/06/2023 Active Self           Med Note ([name], [place]  Apr 14, 2021  9:22 PM)    atorvastatin (LIPITOR) 40 MG tablet [id number] No Take 1 tablet (40 mg total) by mouth every evening. Chilton Si, MD 01/06/2023  Active Self  buPROPion (WELLBUTRIN) 100 MG tablet [id number] No Take 100 mg by mouth 2 (two) times daily. [provider] 01/06/2023 Active Self  carvedilol (COREG) 25 MG tablet [id number] No Take 1 tablet (25 mg total) by mouth 2 (two) times daily with a meal. [name], York Ram, MD 01/06/2023 Active Self  cholecalciferol (VITAMIN D3) 25 MCG (1000 UNIT) tablet [id number] No Take 2,000 Units by mouth daily. [provider] 01/06/2023 Active Self  cloNIDine (CATAPRES) 0.3 MG tablet [id number] No Take 0.3 mg by mouth 3 (three) times daily. [provider] 01/06/2023 Active Self           Med Note Suezanne Cheshire   Tue Mar 03, 2022  9:23 AM)    Continuous Blood Gluc Transmit (DEXCOM G6 TRANSMITTER) MISC [id number] No Use one every 3 month [provider] Unknown Active   dapagliflozin propanediol (FARXIGA) 10 MG TABS tablet [id number] No Take 1 tablet (10 mg total) by mouth daily before breakfast. Maria Hudson, [place] 01/05/2023 Active   ferrous sulfate 325 (65 FE) MG tablet [id number] No Take 325 mg by mouth 2 (two) times daily. [provider] 12/29/2022 Active Self  fluticasone (FLONASE) 50 MCG/ACT nasal spray [id number] No Place 1 spray into both nostrils daily as needed for allergies or rhinitis. [provider] Unknown Active Self  hydrALAZINE (APRESOLINE) 100 MG tablet [id number] No TAKE 1 TABLET BY MOUTH 3 TIMES DAILY. Chilton Si, MD 01/06/2023 Active   insulin glargine (LANTUS) 100 unit/mL SOPN [id number] No Inject 10 Units into the skin at bedtime. [provider] 01/01/2023 Active Self           Med Note [name], Maria Hudson   Tue Oct 13, 2022  9:23 AM)    isosorbide mononitrate (IMDUR) 120 MG 24 hr tablet [id number] No Take 120 mg by mouth daily. [provider] 01/06/2023 Active   linaclotide (LINZESS) 145 MCG CAPS capsule [id number] No Take 1 capsule (145 mcg total) by mouth daily before breakfast. Zehr,  Princella Pellegrini, PA-C 01/06/2023 Active   loratadine (CLARITIN) 10 MG tablet [id number] No Take 10 mg by mouth in the morning. [provider] 01/06/2023 Active Self  Multiple Vitamin (MULTIVITAMIN ADULT) TABS [id number] No Take 1 tablet by mouth in the morning. [provider] Unknown Active Self  nitroGLYCERIN (NITROSTAT) 0.4 MG SL tablet [id number] No Place 1 tablet (0.4 mg total) under the tongue every 5 (five) minutes as needed for chest pain. [name], York Ram, MD Unknown Active Self           Med Note Kittie Plater, Smitty Cords   Tue Aug 19, 2021  9:35 AM)    Rubbie Battiest FLEXPEN 100 UNIT/ML FlexPen [id number] No Inject 5 Units into the skin 3 (three) times daily with meals. [provider] 01/01/2023 Active Self           Med Note [name], Maria Hudson   Tue Oct 13, 2022  9:23 AM)    Omega-3 Fatty Acids (FISH OIL) 1200 MG CAPS [id number] No Take 1,200 mg by mouth in the morning. [provider] 01/01/2023 Active Self  pregabalin (LYRICA) 25 MG capsule [id number] No Take 25 mg by mouth 2 (two) times daily. [provider] 01/06/2023 Active Self  Semaglutide, 2 MG/DOSE, (OZEMPIC, 2 MG/DOSE,) 8 MG/3ML SOPN [id number] No Inject  2 mg into the skin once a week. Cheree Ditto, [facility] 12/23/2022 Active   sertraline (ZOLOFT) 50 MG tablet [id number] No Take 50 mg by mouth in the morning. [provider] 01/06/2023 Active Self  sodium phosphate (FLEET) 7-19 GM/118ML enema 1 enema [id number]   Sherrilyn Rist, MD  Active   Sodium Phosphates (FLEET SALINE ENEMA) 7-19 GM/197ML ENEM [id number] No Place 4.5 fluid ounces rectally once. [id number]; lot# [id number]; exp. 2027/02 [provider] Unknown Active   spironolactone (ALDACTONE) 50 MG tablet [id number] No Take 50 mg by mouth daily. [provider] 01/06/2023 Active   torsemide (DEMADEX) 20 MG tablet [id number] No Take 1 tablet (20 mg total) by mouth daily as needed. Prince Rome [place], [place] 01/06/2023  Active   traZODone (DESYREL) 50 MG tablet [id number] No Take 50 mg by mouth at bedtime. [provider] 01/05/2023 Active Self  TURMERIC CURCUMIN PO [id number] No Take 500 mg by mouth daily at 6 (six) AM. [provider] Unknown Active            Patient Active Problem List   Diagnosis Date Noted   History of colonic polyps 12/28/2022   Chronic constipation 03/31/2022   Pain due to onychomycosis of toenails of both feet 12/05/2021   Pure hypercholesterolemia 05/27/2021   Chronic diastolic CHF (congestive heart failure) (HCC) 04/29/2021   Hypertensive urgency 04/14/2021   Acute on chronic diastolic CHF (congestive heart failure) (HCC) 04/14/2021   Chest pain 03/05/2021   CAD (coronary artery disease) 03/05/2021   T2DM (type 2 diabetes mellitus) (HCC) 03/05/2021   CKD (chronic kidney disease) stage 4, GFR 15-29 ml/min (HCC) 03/05/2021   Class 3 obesity (HCC) 03/05/2021   CVA (cerebral vascular accident) (HCC) 03/05/2021   Acute on chronic diastolic heart failure (HCC) 03/05/2021   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   AKI (acute kidney injury) (HCC) 03/05/2021   Vitreous hemorrhage of left eye (HCC) 11/14/2020   Orthopnea 08/06/2020   Vision loss of right eye 08/06/2020   Excessive daytime sleepiness 01/11/2020   Iron deficiency anemia 12/14/2019   Vitreous hemorrhage, right (HCC) 12/04/2019   Diabetic neuropathy (HCC) 07/26/2019   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) 02/03/2019   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   Conductive hearing loss, bilateral 03/03/2018   Chronic bilateral low back pain without sciatica 08/21/2015   Chronic kidney disease (CKD) stage G3b/A3, moderately decreased glomerular filtration rate (GFR) between 30-44 mL/min/1.73 square meter and albuminuria creatinine ratio greater than 300 mg/g (HCC) 04/30/2015   Type 2 diabetes mellitus with diabetic polyneuropathy, with  long-term current use of insulin (HCC) 02/15/2015   Advance directive on file 05/17/2014   Refusal of blood transfusions as patient is Jehovah's Witness 05/17/2014   OSA treated with BiPAP 02/23/2014   Coronary artery disease 07/07/2013   Severe obesity (BMI >= 40) (HCC) 05/18/2013   Neuropathy 04/11/2013   Diastolic heart failure (HCC) 10/16/2012   Essential hypertension 10/16/2012   Conditions to be addressed/monitored per PCP order:  Chronic healthcare management needs, HTN, retinopathy, chronic pain, depression, gout, CKD, DM, CAD, CHF, OSA, constipation, chest pain  Care Plan : RN Care Manager Plan Of Care  Updates made by [name], RN since 01/08/2023 12:00 AM     Problem: Knowledge Deficit and Care Coordination Needs Related to Management of HTN, HF, Type 2 DM   Priority: High     Long-Range Goal: Development of Plan of Care to  Address Knowledge Deficits and Care Coordination Needs related to  management of CHF, CAD, HTN, HLD, DMII, CKD Stage 4, and Depression   Start Date: 05/06/2021  Expected End Date: 04/10/2023  Priority: High  Note:   Current Barriers:  Knowledge Deficits related to plan of care for management of CHF, CAD, HTN, HLD, DMII, CKD Stage 4, and Depression 01/08/23:  Colonoscopy completed.  Will schedule CARDS and ORTHO appt-labile BP and left shoulder pain continue.  BS stable, 90-168. [facility] appointments continue.  No change in chest pain.  To f/u on ENT referral.  RNCM Clinical Goal(s):  Patient will demonstrate ongoing adherence to prescribed treatment plan for CHF, CAD, HTN, HLD, DMII, CKD Stage 4, and Depression as evidenced by patient report of improved health and quality of life and no unplanned ED or unplanned hospital admissions continue to work with RN Care Manager and/or Social Worker to address care management and care coordination needs related to CHF, CAD, HTN, HLD, DMII, CKD Stage 4, and Depression as evidenced by adherence to CM Team  Scheduled appointments      Work with [facility] LCSW and psychiatrist to treat depression  Interventions: Inter-disciplinary care team collaboration (see longitudinal plan of care) Evaluation of current treatment plan related to  self management and patient's adherence to plan as established by provider Mailed summary of benefits for her [facility] Managed Medicaid plan to her home address Collaborated with Care Guide for dental resources-completed. Care Guide referral for dental resources Collaborated with BSW for assistance with [facility] [place] appt.-completed. BSW referral for appt assistance. Collaborated with Pharmacy regarding  uncontrolled HTN Pharmacy referral for uncontrolled HTN-completed. Collaborated with LCSW for depression LCSW referral for depression-completed BSW referral for dental and PCP resources-completed. Collaborated with BSW  CAD Interventions: (Status:  New goal.) Long Term Goal Assessed understanding of CAD diagnosis Medications reviewed including medications utilized in CAD treatment plan Provided education on importance of blood pressure control in management of CAD Counseled on importance of regular laboratory monitoring as prescribed Reviewed Importance of taking all medications as prescribed Reviewed Importance of attending all scheduled provider appointments Advised to report any changes in symptoms or exercise tolerance Assessed social determinant of health barriers  Hyperlipidemia Interventions:  (Status:  New goal.) Long Term Goal Medication review performed; medication list updated in electronic medical record.  Provider established cholesterol goals reviewed Counseled on importance of regular laboratory monitoring as prescribed Reviewed importance of limiting foods high in cholesterol Assessed social determinant of health barriers     Heart Failure Interventions:  (Status: Goal on Track (progressing): YES.)  Long Term Goal - patient states her fluid  retention has significantly improved,  says she cannot tolerate the compression stockings because they are too tight and make her legs hurt Assessed patient's heart failure management strategies  Diabetes:  (Status: Goal on Track (progressing): YES.) Long Term Goal   Lab Results  Component Value Date   HGBA1C 6.7 (H) 04/29/2021   Hgn A1C=6.3 on 12/26/21 Hgb A1C=5.2 in October per patient HGBA1C=5.8 in March per patient HGBA1C=6.1 on 12/06/24  Assessed patient's understanding of A1C goal: <7% Reviewed medications with patient and discussed importance of medication adherence;        Counseled on importance of regular laboratory monitoring as prescribed;        Discussed plans with patient for ongoing care management follow up and provided patient with direct contact information for care management team;      Advised patient, providing education and rationale, to check  cbg three times daily and record        call provider for findings outside established parameters;       Review of patient status, including review of consultants reports, relevant laboratory and other test results, and medications completed;       Discussed addition of Ozempic to medication regimen , reviewed mechanism of action and proven cardiovascular benefits To schedule ENDO appt-discussed nutrition referral, [facility], [facility]-ENDO appt scheduled-patient declines referral.  Hypertension and CKD stage 4 : (Status: Goal on Track (progressing): YES.) Long Term Goal-  has been placed on kidney transplant waiting list, fistu;a placed 12/09/21,ready for use 03/11/22. Last practice recorded BP readings:  BP 04/01/22-145/87 BP 05/01/22-141/70 BP 08/04/22-155/70 BP 11/05/22-152/76  BP Readings from Last 3 Encounters:  09/30/21 120/70  09/12/21 (!) 107/58  09/08/21 (!) 144/71  12/09/22         136-148/62-72 01/08/23         126-178/66-85    Most recent eGFR/CrCl:    No components found for: CRCL Lab  Results  Component Value Date   NA 136 08/19/2021   K 3.9 08/19/2021   CREATININE 2.51 (H) 08/19/2021   EGFR 19 (L) 03/13/2021   GFRNONAA 22 (L) 08/19/2021   GLUCOSE 122 (H) 08/19/2021    Evaluation of current treatment plan related to hypertension self management and patient's adherence to plan as established by provider;   Reviewed medications with patient and discussed importance of compliance;  Discussed plans with patient for ongoing care management follow up and provided patient with direct contact information for care management team; Advised patient, providing education and rationale, to monitor blood pressure daily and record, calling PCP for findings outside established parameters;  Reviewed scheduled/upcoming provider appointments   Weight Loss Interventions:  (Status:  New goal.) Long Term Goal--patient has started Ozempic Discussed indications, mechanism of action, common side effects and CV benefits  of GLP1 Ozempic Assessed tolerance of Ozempic 08/04/22-patient states she has gained 7 pounds this month because she is not eating well-discussed nutrition referral, exercise, and [facility]-patient states she will check on these. 09/03/22: referrals declined 12/08/22: 209, medications adjusted, Ozempic 2mg , Novolog and Lantus adjusted  Interdisciplinary Collaboration:  (Status: Goal on Track (progressing): YES.) Long Term Goal  Collaborated with BSW to initiate plan of care to address needs related to Housing barriers, Inability to perform ADL's independently, and Inability to perform IADL's independently in patient with CHF, CAD, HTN, HLD, DM, CKD Stage 4, and Depression Assessed status of patient's involvement with behavioral health counselor Collaboration with pharmacist for medication review, discussion of ? Medication side effects causing chronic constipation and arranging for patient to received home delivery of medications in blister packs  Patient  Goals/Self-Care Activities: Take medications as prescribed   Attend all scheduled provider appointments Call pharmacy for medication refills 3-7 days in advance of running out of medications Attend church or other social activities Perform all self care activities independently  Call provider office for new concerns or questions  Work with the social worker to address care coordination needs and will continue to work with the clinical team to address health care and disease management related needs call office if I gain more than 2 pounds in one day or 5 pounds in one week keep legs up while sitting track weight in diary use salt in moderation watch for swelling in feet, ankles and legs every day weigh myself daily know when to call the doctorfor fluid weight gain  or consistently abnormal BP readings at home track symptoms and what helps feel better or worse check blood sugar at prescribed times: three times daily enter blood sugar readings and medication or insulin into daily log take the blood sugar log to all doctor visits take the blood sugar meter to all doctor visits eat fish at least once per week fill half of plate with vegetables manage portion size check blood pressure daily write blood pressure results in a log or diary learn about high blood pressure take blood pressure log to all doctor appointments call doctor for signs and symptoms of high blood pressure keep all doctor appointments take medications for blood pressure exactly as prescribed Limit fluid intake to 1 liter per day Collaborate with managed Medicaid team of BSW and pharmacist to address identified needs Work with cardiology pharmacist to assist with BP stabilization Work with [facility] LCSW and psychiatrist to treat depression Review summary of [employer] benefits mailed to home address and call to enroll in program that will benefit your chronic health issues Continue to use [facility] Care  Management calendar to record weight , BP and CBGs so readings are all in one place Schedule dental appt Schedule ENDO appt-completed. Patient to follow up on ENT referral   Long-Range Goal: Establish Plan of Care for Chronic Disease Management Needs   Priority: High  Note:   Timeframe:  Long-Range Goal Priority:  High Start Date:     11/12/21                        Expected End Date:  ongoing                     Follow Up Date: 02/08/23   - schedule appointment for vaccines needed due to my age or health - schedule recommended health tests (blood work, mammogram, colonoscopy, pap test) - schedule and keep appointment for annual check-up    Why is this important?   Screening tests can find diseases early when they are easier to treat.  Your doctor or nurse will talk with you about which tests are important for you.  Getting shots for common diseases like the flu and shingles will help prevent them.  01/08/23:  Colonoscopy completed 7/15.  To schedule CARDS and ORTHO appt-to f/u on ENT referral. [facility] weekly visits   Follow Up:  Patient agrees to Care Plan and Follow-up.  Plan: The Managed Medicaid care management team will reach out to the patient again over the next 30 business  days. and The  Patient has been provided with contact information for the Managed Medicaid care management team and has been advised to call with any health related questions or concerns.  Date/time of next scheduled RN care management/care coordination outreach: 02/08/23 at 230

## 2023-01-11 DIAGNOSIS — E113512 Type 2 diabetes mellitus with proliferative diabetic retinopathy with macular edema, left eye: Secondary | ICD-10-CM | POA: Diagnosis not present

## 2023-01-11 DIAGNOSIS — H20042 Secondary noninfectious iridocyclitis, left eye: Secondary | ICD-10-CM | POA: Diagnosis not present

## 2023-01-11 DIAGNOSIS — H4311 Vitreous hemorrhage, right eye: Secondary | ICD-10-CM | POA: Diagnosis not present

## 2023-01-11 DIAGNOSIS — E113591 Type 2 diabetes mellitus with proliferative diabetic retinopathy without macular edema, right eye: Secondary | ICD-10-CM | POA: Diagnosis not present

## 2023-01-11 DIAGNOSIS — F4321 Adjustment disorder with depressed mood: Secondary | ICD-10-CM | POA: Diagnosis not present

## 2023-01-14 DIAGNOSIS — Z419 Encounter for procedure for purposes other than remedying health state, unspecified: Secondary | ICD-10-CM | POA: Diagnosis not present

## 2023-01-15 ENCOUNTER — Other Ambulatory Visit: Payer: Medicaid Other

## 2023-01-15 NOTE — Patient Outreach (Signed)
Medicaid Managed Care Social Work Note  01/15/2023 Name:  Maria Hudson MRN:  [id number] DOB:  Oct 14, 1960  Maria Hudson is an 62 y.o. year old female who is a primary patient of Fleet Contras, MD.  The Medicaid Managed Care Coordination team was consulted for assistance with:   housing   Maria Hudson was given information about Medicaid Managed Care Coordination team services today. Maria Hudson Patient agreed to services and verbal consent obtained.  Engaged with patient  for by telephone forfollow up visit in response to referral for case management and/or care coordination services.   Assessments/Interventions:  Review of past medical history, allergies, medications, health status, including review of consultants reports, laboratory and other test data, was performed as part of comprehensive evaluation and provision of chronic care management services.  SDOH: (Social Determinant of Health) assessments and interventions performed: SDOH Interventions    Flowsheet Row Patient Outreach Telephone from 01/08/2023 in [place] POPULATION HEALTH DEPARTMENT Patient Outreach Telephone from 12/09/2022 in [place] POPULATION HEALTH DEPARTMENT Patient Outreach Telephone from 11/05/2022 in [place] POPULATION HEALTH DEPARTMENT Patient Outreach Telephone from 11/02/2022 in [place] POPULATION HEALTH DEPARTMENT Patient Outreach Telephone from 10/06/2022 in [place] POPULATION HEALTH DEPARTMENT Patient Outreach Telephone from 09/22/2022 in  POPULATION HEALTH DEPARTMENT  SDOH Interventions        Food Insecurity Interventions -- -- Intervention Not Indicated -- -- --  Housing Interventions -- Intervention Not Indicated -- -- -- --  Transportation Interventions Intervention Not Indicated -- -- -- -- --  Alcohol Usage Interventions -- -- Intervention Not Indicated (Score <7) -- -- --  Depression Interventions/Treatment  -- Counseling, Medication  [patient speaks with [facility] every  week] -- -- -- --  Physical Activity Interventions -- -- -- -- Other (Comments)  [not phyasically able to enage in a moderate to strenuous exercise routine] --  Stress Interventions -- -- -- Maria Hudson, Provide Counseling -- Maria Hudson, Provide Counseling  Health Literacy Interventions Intervention Not Indicated -- -- -- -- --     BSW completed a telephone outreach with patient, she states she did receieve the resources BSW sent, but no one has any avaliability. BSW will send patient the information on the new apartments that just opened in [place]. No other resources are needed at this time.  Advanced Directives Status:  Not addressed in this encounter.  Care Plan                 Allergies  Allergen Reactions   Metformin And Related Nausea Only   Other     Clonidine patch causes skin irritation    Hydrocodone Itching    Patient able to tolerate when taken with Benadryl.   Oxycodone-Acetaminophen Itching    Patient able to tolerate when taken with Benadryl.    Medications Reviewed Today   Medications were not reviewed in this encounter     Patient Active Problem List   Diagnosis Date Noted   History of colonic polyps 12/28/2022   Chronic constipation 03/31/2022   Pain due to onychomycosis of toenails of both feet 12/05/2021   Pure hypercholesterolemia 05/27/2021   Chronic diastolic CHF (congestive heart failure) (HCC) 04/29/2021   Hypertensive urgency 04/14/2021   Acute on chronic diastolic CHF (congestive heart failure) (HCC) 04/14/2021   Chest pain 03/05/2021   CAD (coronary artery disease) 03/05/2021   T2DM (type 2 diabetes mellitus) (HCC) 03/05/2021   CKD (chronic kidney disease) stage 4, GFR 15-29 ml/min (HCC)  03/05/2021   Class 3 obesity (HCC) 03/05/2021   CVA (cerebral vascular accident) (HCC) 03/05/2021   Acute on chronic diastolic heart failure (HCC) 03/05/2021   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   AKI (acute  kidney injury) (HCC) 03/05/2021   Vitreous hemorrhage of left eye (HCC) 11/14/2020   Orthopnea 08/06/2020   Vision loss of right eye 08/06/2020   Excessive daytime sleepiness 01/11/2020   Iron deficiency anemia 12/14/2019   Vitreous hemorrhage, right (HCC) 12/04/2019   Diabetic neuropathy (HCC) 07/26/2019   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) 02/03/2019   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   Conductive hearing loss, bilateral 03/03/2018   Chronic bilateral low back pain without sciatica 08/21/2015   Chronic kidney disease (CKD) stage G3b/A3, moderately decreased glomerular filtration rate (GFR) between 30-44 mL/min/1.73 square meter and albuminuria creatinine ratio greater than 300 mg/g (HCC) 04/30/2015   Type 2 diabetes mellitus with diabetic polyneuropathy, with long-term current use of insulin (HCC) 02/15/2015   Advance directive on file 05/17/2014   Refusal of blood transfusions as patient is Jehovah's Witness 05/17/2014   OSA treated with BiPAP 02/23/2014   Coronary artery disease 07/07/2013   Severe obesity (BMI >= 40) (HCC) 05/18/2013   Neuropathy 04/11/2013   Diastolic heart failure (HCC) 10/16/2012   Essential hypertension 10/16/2012    Conditions to be addressed/monitored per PCP order:   housing  There are no care plans that you recently modified to display for this patient.   Follow up:  Patient agrees to Care Plan and Follow-up.  Plan: The Managed Medicaid care management team will reach out to the patient again over the next 60 days.  Date/time of next scheduled Social Work care management/care coordination outreach:  03/18/23  Gus Puma, Kenard Gower, [facility]  Managed [facility] Social Worker [phone]

## 2023-01-15 NOTE — Patient Instructions (Signed)
Visit Information  Maria Hudson was given information about Medicaid Managed Care team care coordination services as a part of their [facility] Medicaid benefit. Maria Hudson verbally consented to engagement with the [facility] Managed Care team.   If you are experiencing a medical emergency, please call 911 or report to your local emergency department or urgent care.   If you have a non-emergency medical problem during routine business hours, please contact your provider's office and ask to speak with a nurse.   For questions related to your [facility] health plan, please call: [phone] or go here:[email or web address]  If you would like to schedule transportation through your [facility] plan, please call the following number at least 2 days in advance of your appointment: [phone].   You can also use the MTM portal or MTM mobile app to manage your rides. Reimbursement for transportation is available through [facility]! For the portal, please go to [email or web address]  Call the [facility] Crisis Line at [phone], at any time, 24 hours a day, 7 days a week. If you are in danger or need immediate medical attention call 911.  If you would like help to quit smoking, call 1-800-QUIT-NOW (450-571-6522) OR Espaol: 1-855-Djelo-Ya (4-132-440-1027) o para ms informacin haga clic aqu or Text READY to 253-664 to register via text  Ms. Dunnam - following are the goals we discussed in your visit today:   Goals Addressed   None       Social Worker will follow up in 60 days.   Maria Hudson, Maria Hudson, MHA [facility]  Managed Medicaid Social Worker [phone]   Following is a copy of your plan of care:  There are no care plans that you recently modified to display for this patient.

## 2023-01-18 DIAGNOSIS — F4321 Adjustment disorder with depressed mood: Secondary | ICD-10-CM | POA: Diagnosis not present

## 2023-01-25 DIAGNOSIS — F4321 Adjustment disorder with depressed mood: Secondary | ICD-10-CM | POA: Diagnosis not present

## 2023-01-26 DIAGNOSIS — G4733 Obstructive sleep apnea (adult) (pediatric): Secondary | ICD-10-CM | POA: Diagnosis not present

## 2023-02-01 DIAGNOSIS — F4321 Adjustment disorder with depressed mood: Secondary | ICD-10-CM | POA: Diagnosis not present

## 2023-02-02 ENCOUNTER — Other Ambulatory Visit (HOSPITAL_COMMUNITY): Payer: Self-pay | Admitting: Family Medicine

## 2023-02-02 ENCOUNTER — Ambulatory Visit (HOSPITAL_COMMUNITY)
Admission: RE | Admit: 2023-02-02 | Discharge: 2023-02-02 | Disposition: A | Discharge status: Home / Self Care | Payer: Medicaid Other | Source: Ambulatory Visit | Attending: Family Medicine | Admitting: Family Medicine

## 2023-02-02 ENCOUNTER — Encounter (HOSPITAL_COMMUNITY): Payer: Self-pay

## 2023-02-02 VITALS — BP 164/70 | HR 74 | Wt 213.4 lb

## 2023-02-02 DIAGNOSIS — Z6837 Body mass index (BMI) 37.0-37.9, adult: Secondary | ICD-10-CM | POA: Insufficient documentation

## 2023-02-02 DIAGNOSIS — G4733 Obstructive sleep apnea (adult) (pediatric): Secondary | ICD-10-CM

## 2023-02-02 DIAGNOSIS — I1A0 Resistant hypertension: Secondary | ICD-10-CM | POA: Diagnosis not present

## 2023-02-02 DIAGNOSIS — I2583 Coronary atherosclerosis due to lipid rich plaque: Secondary | ICD-10-CM | POA: Diagnosis not present

## 2023-02-02 DIAGNOSIS — Z7982 Long term (current) use of aspirin: Secondary | ICD-10-CM | POA: Insufficient documentation

## 2023-02-02 DIAGNOSIS — E669 Obesity, unspecified: Secondary | ICD-10-CM | POA: Diagnosis not present

## 2023-02-02 DIAGNOSIS — I5032 Chronic diastolic (congestive) heart failure: Secondary | ICD-10-CM | POA: Insufficient documentation

## 2023-02-02 DIAGNOSIS — Z794 Long term (current) use of insulin: Secondary | ICD-10-CM | POA: Insufficient documentation

## 2023-02-02 DIAGNOSIS — Z7984 Long term (current) use of oral hypoglycemic drugs: Secondary | ICD-10-CM | POA: Insufficient documentation

## 2023-02-02 DIAGNOSIS — N184 Chronic kidney disease, stage 4 (severe): Secondary | ICD-10-CM

## 2023-02-02 DIAGNOSIS — E1140 Type 2 diabetes mellitus with diabetic neuropathy, unspecified: Secondary | ICD-10-CM | POA: Diagnosis not present

## 2023-02-02 DIAGNOSIS — I130 Hypertensive heart and chronic kidney disease with heart failure and stage 1 through stage 4 chronic kidney disease, or unspecified chronic kidney disease: Secondary | ICD-10-CM | POA: Insufficient documentation

## 2023-02-02 DIAGNOSIS — Z79899 Other long term (current) drug therapy: Secondary | ICD-10-CM | POA: Diagnosis not present

## 2023-02-02 DIAGNOSIS — R609 Edema, unspecified: Secondary | ICD-10-CM | POA: Diagnosis not present

## 2023-02-02 DIAGNOSIS — R079 Chest pain, unspecified: Secondary | ICD-10-CM | POA: Insufficient documentation

## 2023-02-02 DIAGNOSIS — E1122 Type 2 diabetes mellitus with diabetic chronic kidney disease: Secondary | ICD-10-CM | POA: Insufficient documentation

## 2023-02-02 DIAGNOSIS — G8929 Other chronic pain: Secondary | ICD-10-CM | POA: Insufficient documentation

## 2023-02-02 DIAGNOSIS — I2510 Atherosclerotic heart disease of native coronary artery without angina pectoris: Secondary | ICD-10-CM | POA: Insufficient documentation

## 2023-02-02 DIAGNOSIS — Z8616 Personal history of COVID-19: Secondary | ICD-10-CM | POA: Diagnosis not present

## 2023-02-02 DIAGNOSIS — R0602 Shortness of breath: Secondary | ICD-10-CM | POA: Diagnosis not present

## 2023-02-02 DIAGNOSIS — M25579 Pain in unspecified ankle and joints of unspecified foot: Secondary | ICD-10-CM | POA: Insufficient documentation

## 2023-02-02 LAB — BASIC METABOLIC PANEL
Anion gap: 11 (ref 5–15)
BUN: 72 mg/dL — ABNORMAL HIGH (ref 8–23)
CO2: 20 mmol/L — ABNORMAL LOW (ref 22–32)
Calcium: 9.1 mg/dL (ref 8.9–10.3)
Chloride: 108 mmol/L (ref 98–111)
Creatinine, Ser: 2.9 mg/dL — ABNORMAL HIGH (ref 0.44–1.00)
GFR, Estimated: 18 mL/min — ABNORMAL LOW (ref >60)
Glucose, Bld: 118 mg/dL — ABNORMAL HIGH (ref 70–99)
Potassium: 4.8 mmol/L (ref 3.5–5.1)
Sodium: 139 mmol/L (ref 135–145)

## 2023-02-02 LAB — BRAIN NATRIURETIC PEPTIDE: B Natriuretic Peptide: 41.6 pg/mL (ref 0.0–100.0)

## 2023-02-02 MED ORDER — CLONIDINE HCL 0.2 MG PO TABS: 0.4000 mg | ORAL_TABLET | Freq: Three times a day (TID) | ORAL | 3 refills | Status: DC

## 2023-02-02 NOTE — Progress Notes (Addendum)
PCP: Fleet Contras, MD Nephrology: Dr. Malen Gauze HF Cardiology: Dr. Shirlee Latch  62 y.o. with history of supine hypertension with orthostatic hypotension, CKD stage 4, CAD, and chronic diastolic CHF was referred from [facility] clinic for evaluation of CHF.    Patient had MI in 05/2013. LHC showed 20% LAD, 50 to 60% left circumflex, and 100% RPDA stenosis with left-to-right collaterals. CAD managed medically.    She reports a long h/o diastolic heart failure and frequent hospitalizations. She moved to [place] from [place], had been followed by [facility] Cardiology. Has struggled w/ volume control and readjustment of diuretics over the years. She reports previously being prescribed Marcelline Deist but had to stop b/c her insurance would not cover it. Her last echo at [facility] was 7/21. LVEF was 55%, RV normal. RVSP elevated at 60 mmHg. PYP at [facility] was negative for amyloid (grade 1, H/CL 1.21). Serum IFE not suggestive of monoclonal gammopathy. Urine IFE and immunoglobin free light chains were elevated (lg FLCK 9.24, Ig FLCL 3.75, K/L ratio 2.46. Repeat testing recommended 6 months later, but do not see that this was completed).    Referred to HTN clinic 9/22 for uncontrolled HTN. Now on multidrug regimen. Thyroid function, renin, and aldosterone were normal, as were catecholamines and metanephrines. Her PTH was elevated at 134 but calcium was normal. Renal artery doppler study was limited. There was no evidence of right RAS however the left renal artery was not well visualized due to the presence of bowel gas.  Echo was repeated 9/22 showing normal LVEF 55-60%, G1DD and normal RV.    She has struggled w/ fluid management recently, c/b stage IV CKD. Baseline SCr ~2.3.  Followed by nephrology. Previously on Bumex and metolazone. She was admitted 10/22 for a/c diastolic CHF and diuresed w/ IV Lasix. Did not get RHC. Transitioned back to PO diuretics, Torsemide 40 mg once daily + spiro 25 mg daily. D/w Wt 231 lb. Referred to [facility] clinic.   She  was admitted again in 11/22 with chest pain, AKI, and orthostatic hypotension.  Torsemide was stopped.  HS-TnI was negative, suspect musculoskeletal chest pain.    PYP study repeated in 12/22 was not suggestive of cardiac amyloidosis.   She has had an AV fistula placed.    Follow up 4/24 NYHA II and volume stable.   Echo 5/24 showed EF 60-65%, moderate LVH, G1DD, normal RV, RVSP 35 mmHg  Today she returns for HF follow up. Overall feeling fine. She is SOB walking short distances on flat ground. She has mild ankle edema. She is unable to walk up steps due to chronic joint pain. Continues with rare, atypical chest discomfort. Struggling with balance issues, no falls. Neuropathy in toes is worse. Denies palpitations, dizziness, or PND/Orthopnea. Appetite ok. No fever or chills. Weight at home 214 pounds. Taking all medications. BP at home 140-190s, compliant with CPAP.  ECG (personally reviewed): none ordered today.  Labs (11/22): K 3.8, creatinine 3.6 => 2.62 Labs (2/23): K 3.7, creatinine 3.31  Labs (4/23): K 4.5, creatinine 2.69, LDL 78, HDL 58 Labs (6/23): K 4.2, creatinine 2.65 Labs (9/23): K 3.6, creatinine 2.25 Labs (11/23): K 4.1, creatinine 2.52, BNP 81 Labs (4/24): K 4.1, creatinine 2.92, LDL 54  PMH: 1. HTN: Urine catecholamines and metanephrines normal level, aldosterone normal level.  Complicated by orthostatic hypotension.   - Renal artery dopplers (11/22): No right renal artery stenosis, unable to visualize left renal artery due to overlying bowel gas.  2. Orthostatic hypotension 3. CKD stage 4  4. Type 2 diabetes 5. OSA: Uses CPAP 6. CAD: LHC in 2014 with totally occluded RCA with collaterals, medical management.  7. Chronic diastolic CHF: Echo (7/21) with EF 55%, normal RV, PASP 60 mmHg.  - PYP scan 11/20 at [facility] was negative with grade 1, H/CL 1.21.  - Echo (9/22): EF 55-60%, mild LVH, normal RV, IVC normal.  - RHC (11/22): mean RA 1, PA 41/10 mean 24, mean PCWP 12, CI  3.  - PYP scan (12/22): grade 1, H/CL 1.  - Echo (5/24) EF 60-65%,  moderate LVH, G1DD, normal RV, RVSP 35 8. Gout 9. COVID-19 2/23  Social History   Socioeconomic History   Marital status: Divorced    Spouse name: Not on file   Number of children: 2   Years of education: Not on file   Highest education level: Some college, no degree  Occupational History   Occupation: retired   Occupation: disabled  Tobacco Use   Smoking status: Never    Passive exposure: Never   Smokeless tobacco: Never  Vaping Use   Vaping status: Never Used  Substance and Sexual Activity   Alcohol use: Never   Drug use: Never   Sexual activity: Not on file  Other Topics Concern   Not on file  Social History Narrative   Not on file   Social Determinants of Health   Financial Resource Strain: Low Risk  (08/04/2022)   Overall Financial Resource Strain (CARDIA)    Difficulty of Paying Living Expenses: Not very hard  Food Insecurity: No Food Insecurity (11/05/2022)   Hunger Vital Sign    Worried About Running Out of Food in the Last Year: Never true    Ran Out of Food in the Last Year: Never true  Transportation Needs: No Transportation Needs (01/08/2023)   PRAPARE - Administrator, Civil Service (Medical): No    Lack of Transportation (Non-Medical): No  Physical Activity: Inactive (10/06/2022)   Exercise Vital Sign    Days of Exercise per Week: 0 days    Minutes of Exercise per Session: 0 min  Stress: No Stress Concern Present (11/02/2022)   [employer] of Occupational Health - Occupational Stress Questionnaire    Feeling of Stress : Only a little  Recent Concern: Stress - Stress Concern Present (08/19/2022)   [employer] of Occupational Health - Occupational Stress Questionnaire    Feeling of Stress : To some extent  Social Connections: Moderately Isolated (09/03/2022)   Social Connection and Isolation Panel [NHANES]    Frequency of Communication with Friends and Family: More  than three times a week    Frequency of Social Gatherings with Friends and Family: More than three times a week    Attends Religious Services: More than 4 times per year    Active Member of [employer] or Organizations: No    Attends Banker Meetings: Never    Marital Status: Divorced  Catering manager Violence: Not At Risk (10/06/2022)   Humiliation, Afraid, Rape, and Kick questionnaire    Fear of Current or Ex-Partner: No    Emotionally Abused: No    Physically Abused: No    Sexually Abused: No   Family History  Problem Relation Age of Onset   Hypertension Mother    Stroke Mother    Diabetes Mother    Other Mother        enlarged heart   Hypertension Father    Hypertension Sister    Diabetes Sister  CAD Sister        enlarged heart   Kidney disease Sister    Diabetes Paternal Grandmother    Hypertension Daughter    Diabetes Daughter    Hypertension Son    Diabetes Son    Breast cancer Paternal Aunt    ROS: All systems reviewed and negative except as per HPI.   Current Outpatient Medications  Medication Sig Dispense Refill   Accu-Chek Softclix Lancets lancets 3 (three) times daily.     acetaminophen (TYLENOL) 500 MG tablet Take 1,000 mg by mouth every 6 (six) hours as needed for moderate pain or headache.     allopurinol (ZYLOPRIM) 100 MG tablet Take 100 mg by mouth in the morning.     amLODipine (NORVASC) 10 MG tablet Take 10 mg by mouth daily.     aspirin EC 81 MG tablet Take 81 mg by mouth in the morning. Swallow whole.     atorvastatin (LIPITOR) 40 MG tablet Take 1 tablet (40 mg total) by mouth every evening. 90 tablet 3   buPROPion (WELLBUTRIN) 100 MG tablet Take 100 mg by mouth 2 (two) times daily.     carvedilol (COREG) 25 MG tablet Take 1 tablet (25 mg total) by mouth 2 (two) times daily with a meal. 60 tablet 0   cholecalciferol (VITAMIN D3) 25 MCG (1000 UNIT) tablet Take 2,000 Units by mouth daily.     cloNIDine (CATAPRES) 0.3 MG tablet Take 0.3 mg by  mouth 3 (three) times daily.     Continuous Blood Gluc Transmit (DEXCOM G6 TRANSMITTER) MISC Use one every 3 month     dapagliflozin propanediol (FARXIGA) 10 MG TABS tablet Take 1 tablet (10 mg total) by mouth daily before breakfast. 30 tablet 11   ferrous sulfate 325 (65 FE) MG tablet Take 325 mg by mouth 2 (two) times daily.     fluticasone (FLONASE) 50 MCG/ACT nasal spray Place 1 spray into both nostrils daily as needed for allergies or rhinitis.     hydrALAZINE (APRESOLINE) 100 MG tablet TAKE 1 TABLET BY MOUTH 3 TIMES DAILY. 270 tablet 2   insulin glargine (LANTUS) 100 unit/mL SOPN Inject 10 Units into the skin at bedtime.     isosorbide mononitrate (IMDUR) 120 MG 24 hr tablet Take 120 mg by mouth daily.     linaclotide (LINZESS) 145 MCG CAPS capsule Take 1 capsule (145 mcg total) by mouth daily before breakfast. 30 capsule 11   loratadine (CLARITIN) 10 MG tablet Take 10 mg by mouth in the morning.     Multiple Vitamin (MULTIVITAMIN ADULT) TABS Take 1 tablet by mouth in the morning.     nitroGLYCERIN (NITROSTAT) 0.4 MG SL tablet Place 1 tablet (0.4 mg total) under the tongue every 5 (five) minutes as needed for chest pain. 20 tablet 0   NOVOLOG FLEXPEN 100 UNIT/ML FlexPen Inject 5 Units into the skin 3 (three) times daily with meals.     Omega-3 Fatty Acids (FISH OIL) 1200 MG CAPS Take 1,200 mg by mouth in the morning.     pregabalin (LYRICA) 25 MG capsule Take 25 mg by mouth 2 (two) times daily.     Semaglutide, 2 MG/DOSE, (OZEMPIC, 2 MG/DOSE,) 8 MG/3ML SOPN Inject 2 mg into the skin once a week. 3 mL 6   sertraline (ZOLOFT) 50 MG tablet Take 50 mg by mouth in the morning.     spironolactone (ALDACTONE) 50 MG tablet Take 50 mg by mouth daily.     torsemide (DEMADEX)  20 MG tablet Take 1 tablet (20 mg total) by mouth daily as needed. (Patient taking differently: Take 20 mg by mouth daily as needed. Patient takes 1 tablet daily) 90 tablet 3   traZODone (DESYREL) 50 MG tablet Take 50 mg by mouth  at bedtime.     TURMERIC CURCUMIN PO Take 500 mg by mouth daily at 6 (six) AM.     Current Facility-Administered Medications  Medication Dose Route Frequency Provider Last Rate Last Admin   sodium phosphate (FLEET) 7-19 GM/118ML enema 1 enema  1 enema Rectal Once Sherrilyn Rist, MD       Wt Readings from Last 3 Encounters:  02/02/23 96.8 kg (213 lb 6.4 oz)  01/06/23 94.3 kg (208 lb)  01/04/23 94.3 kg (208 lb)   BP (!) 164/70   Pulse 74   Wt 96.8 kg (213 lb 6.4 oz)   SpO2 97%   BMI 37.80 kg/m  Physical Exam General:  NAD. No resp difficulty, walked into clinic HEENT: Normal Neck: Supple. No JVD. Carotids 2+ bilat; no bruits. No lymphadenopathy or thryomegaly appreciated. Cor: PMI nondisplaced. Regular rate & rhythm. No rubs, gallops, 2/6 SEM RUSB  Lungs: Clear Abdomen: Soft, nontender, nondistended. No hepatosplenomegaly. No bruits or masses. Good bowel sounds. Extremities: No cyanosis, clubbing, rash, trace pedal edema Neuro: Alert & oriented x 3, cranial nerves grossly intact. Moves all 4 extremities w/o difficulty. Affect pleasant.  Assessment/Plan: 1. HTN: BP elevated. BP at home 140-190s on home check. - Increase clonidine to 0.4 mg tid. Discussed with PharmD - Continue Coreg 25 mg bid, spironolactone 50 daily, hydralazine 100 mg tid and Imdur 120 mg daily.   - Continue amlodipine 10 mg daily.   - Consider addition of doxazosin, and referral back to HTN clinic. 2. Chronic diastolic CHF: Echo in 9/22 with EF 55-60%, normal RV. RHC in 11/22 with optimized filling pressures.  PYP scan, myeloma studies were not suggestive of cardiac amyloidosis. Echo 5/24 showed EF 60-65%, moderate LVH. GDMT limited by CKD stage IV.  Volume status looks ok today, NYHA class IIb symptoms.  - Continue Farxiga 10 mg daily.  - Continue torsemide 20 mg daily, BMET today.   - With Medicaid, was unable to get Cardiomems.  - Repeat PYP, last scan 2 years ago. Balance and neuropathy worsening. 3.  CKD stage IV: BMET today.  - She is followed by Dr. Malen Gauze for nephrology.  4. CAD: H/o occluded PDA with collaterals.  No changes to chronic chest pain pattern.  - Continue ASA 81 daily.  - Continue atorvastatin, good lipids 4/24.  5. Obesity: Body mass index is 37.8 kg/m. - She is on Ozempic. 6. OSA: Continue CPAP.  Follow up in 4 months with Dr. Shirlee Latch.  Anderson Malta [facility] FNP-BC 02/02/2023

## 2023-02-02 NOTE — Patient Instructions (Addendum)
Medication Changes:  INCREASE CLONIDINE 0.4 THREE TIMES DAILY- TAKE 2 OF THE 0.2MG  TABLETS THREE TIMES DAILY   Lab Work:  Labs done today, your results will be available in MyChart, we will contact you for abnormal readings.  Testing/Procedures:  You have been ordered a PYP Scan.  This is done at [facility], they will call you to schedule.  When you come for this test please plan to be there 2-3 hours.  They are located at: [address] [place], [place] [address]  Follow-Up in: 4 MONTHS WITH DR. Shirlee Latch. PLEASE CALL OUR OFFICE AROUND SEPTEMBER TO GET SCHEDULED FOR YOUR APPOINTMENT. PHONE NUMBER IS [phone] OPTION 2    At the [facility], you and your health needs are our priority. We have a designated team specialized in the treatment of Heart Failure. This Care Team includes your primary Heart Failure Specialized Cardiologist (physician), Advanced Practice Providers (APPs- Physician Assistants and Nurse Practitioners), and Pharmacist who all work together to provide you with the care you need, when you need it.   You may see any of the following providers on your designated Care Team at your next follow up:  Dr. Arvilla Meres Dr. Marca Ancona Dr. Marcos Eke, NP Robbie Lis, [place] [facility], [place] Brynda Peon, NP Karle Plumber, PharmD   Please be sure to bring in all your medications bottles to every appointment.   Need to Contact us:  If you have any questions or concerns before your next appointment please send [place] a message through [place] or call our office at [phone].    TO LEAVE A MESSAGE FOR THE NURSE SELECT OPTION 2, PLEASE LEAVE A MESSAGE INCLUDING: YOUR NAME DATE OF BIRTH CALL BACK NUMBER REASON FOR CALL**this is important as we prioritize the call backs  YOU WILL RECEIVE A CALL BACK THE SAME DAY AS LONG AS YOU CALL BEFORE 4:00 PM

## 2023-02-03 ENCOUNTER — Ambulatory Visit (INDEPENDENT_AMBULATORY_CARE_PROVIDER_SITE_OTHER): Payer: Medicaid Other

## 2023-02-03 ENCOUNTER — Ambulatory Visit: Payer: Medicaid Other | Admitting: Orthopaedic Surgery

## 2023-02-03 ENCOUNTER — Other Ambulatory Visit (INDEPENDENT_AMBULATORY_CARE_PROVIDER_SITE_OTHER): Payer: Medicaid Other

## 2023-02-03 ENCOUNTER — Other Ambulatory Visit: Payer: Self-pay

## 2023-02-03 DIAGNOSIS — G8929 Other chronic pain: Secondary | ICD-10-CM

## 2023-02-03 DIAGNOSIS — M542 Cervicalgia: Secondary | ICD-10-CM

## 2023-02-03 DIAGNOSIS — M25512 Pain in left shoulder: Secondary | ICD-10-CM | POA: Diagnosis not present

## 2023-02-03 MED ORDER — CYCLOBENZAPRINE HCL 10 MG PO TABS: 10.0000 mg | ORAL_TABLET | Freq: Every day | ORAL | 0 refills | Status: DC

## 2023-02-03 NOTE — Progress Notes (Signed)
Office Visit Note   Patient: Maria Hudson           Date of Birth: May 18, 1961           MRN: [id number] Visit Date: 02/03/2023              Requested by: Fleet Contras, MD [address] [place],  [place] [address] PCP: Fleet Contras, MD   Assessment & Plan: Visit Diagnoses:  1. Chronic left shoulder pain   2. Neck pain     Plan: Will discontinue her trazodone.  Place her on Flexeril 10 mg nightly.  Send her to formal physical therapy for her neck and left shoulder.  To work on range of motion, strengthening, home exercise program and modalities.  See her back in about 6 weeks to see how she is doing overall.  She will call us if her condition becomes worse in any way.  Questions were encouraged and answered at length.  Follow-Up Instructions: Return in about 6 weeks (around 03/17/2023).   Orders:  Orders Placed This Encounter  Procedures   XR Shoulder Left   XR Cervical Spine 2 or 3 views   No orders of the defined types were placed in this encounter.     Procedures: No procedures performed   Clinical Data: No additional findings.   Subjective: Chief Complaint  Patient presents with   Left Shoulder - Pain    HPI 62 year old female comes in today with left shoulder pain for approximately 4 months.  No known injury.  She is pain when reaching overhead.  Denies any numbness tingling down either arm.  She has tried Tylenol which relieves some of the pain and also Voltaren gel.  She rates her pain to be 6 out of 10 pain at worst.  She does have some neck pain.  Medical history pertinent for chronic kidney disease stage IV not on dialysis.  Diabetes with a hemoglobin A1c reported at 6.5.  Coronary artery disease, history of CVA, hypertension, CHF and diastolic heart failure.  Review of Systems  Constitutional:  Positive for chills. Negative for fever.  Musculoskeletal:  Positive for neck pain.     Objective: Vital Signs: There were no vitals taken for  this visit.  Physical Exam Constitutional:      Appearance: She is not ill-appearing or diaphoretic.  Pulmonary:     Effort: Pulmonary effort is normal.  Neurological:     Mental Status: She is alert and oriented to person, place, and time.  Psychiatric:        Mood and Affect: Mood normal.     Ortho Exam  Cervical spine: Full flexion and extension.  Positive Spurling's.  Tenderness over the lower cervical spine final:.  Tenderness medial border left scapula and left trapezius region.  Upper extremities: 5 out of 5 strength throughout except for left pincer strength which is 4 out of 5 compared to 5 out of 5 on the right.  Rotator cuff strength 5 out of 5 strength with external and internal rotation against resistance.  Indican test negative bilaterally.  Liftoff test positive on the left negative on the right.  Impingement testing positive on the left negative on the right.  Specialty Comments:  No specialty comments available.  Imaging: XR Cervical Spine 2 or 3 views  Result Date: 02/03/2023 Cervical spine 2 views: No acute fracture.  Loss of lordotic curvature.  Diminished disc space C6 -C7.  No spondylolisthesis.  No acute fractures.  No other bony abnormalities.  XR Shoulder Left  Result Date: 02/03/2023 Left shoulder 3 views: Shoulder is well located.  Subacromial space well-maintained.  Mild AC joint arthritic changes.  Glenohumeral joints well-maintained.  No acute fractures bony abnormalities.    PMFS History: Patient Active Problem List   Diagnosis Date Noted   History of colonic polyps 12/28/2022   Chronic constipation 03/31/2022   Pain due to onychomycosis of toenails of both feet 12/05/2021   Pure hypercholesterolemia 05/27/2021   Chronic diastolic CHF (congestive heart failure) (HCC) 04/29/2021   Hypertensive urgency 04/14/2021   Acute on chronic diastolic CHF (congestive heart failure) (HCC) 04/14/2021   Chest pain 03/05/2021   CAD (coronary artery disease)  03/05/2021   T2DM (type 2 diabetes mellitus) (HCC) 03/05/2021   CKD (chronic kidney disease) stage 4, GFR 15-29 ml/min (HCC) 03/05/2021   Class 3 obesity (HCC) 03/05/2021   CVA (cerebral vascular accident) (HCC) 03/05/2021   Acute on chronic diastolic heart failure (HCC) 03/05/2021   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   AKI (acute kidney injury) (HCC) 03/05/2021   Vitreous hemorrhage of left eye (HCC) 11/14/2020   Orthopnea 08/06/2020   Vision loss of right eye 08/06/2020   Excessive daytime sleepiness 01/11/2020   Iron deficiency anemia 12/14/2019   Vitreous hemorrhage, right (HCC) 12/04/2019   Diabetic neuropathy (HCC) 07/26/2019   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) 02/03/2019   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   Conductive hearing loss, bilateral 03/03/2018   Chronic bilateral low back pain without sciatica 08/21/2015   Chronic kidney disease (CKD) stage G3b/A3, moderately decreased glomerular filtration rate (GFR) between 30-44 mL/min/1.73 square meter and albuminuria creatinine ratio greater than 300 mg/g (HCC) 04/30/2015   Type 2 diabetes mellitus with diabetic polyneuropathy, with long-term current use of insulin (HCC) 02/15/2015   Advance directive on file 05/17/2014   Refusal of blood transfusions as patient is Jehovah's Witness 05/17/2014   OSA treated with BiPAP 02/23/2014   Coronary artery disease 07/07/2013   Severe obesity (BMI >= 40) (HCC) 05/18/2013   Neuropathy 04/11/2013   Diastolic heart failure (HCC) 10/16/2012   Essential hypertension 10/16/2012   Past Medical History:  Diagnosis Date   Acute on chronic diastolic heart failure (HCC) 03/05/2021   Anemia    Arthritis    CAD (coronary artery disease)    CHF (congestive heart failure) (HCC)    CKD (chronic kidney disease) stage 4, GFR 15-29 ml/min (HCC) 03/05/2021   Colon polyps    CVA (cerebral vascular accident) (HCC)     Depression    Diabetes mellitus without complication (HCC)    type 2   Dyspnea    Hypertension    Hypertensive urgency 04/14/2021   Memory loss    mild   OSA (obstructive sleep apnea) 05/27/2021   uses CPAP 12-18 cm   Pneumonia    several times   Pure hypercholesterolemia 05/27/2021   Renal disorder    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   Stroke [facility])     Family History  Problem Relation Age of Onset   Hypertension Mother    Stroke Mother    Diabetes Mother    Other Mother        enlarged heart   Hypertension Father    Hypertension Sister    Diabetes Sister    CAD Sister        enlarged heart   Kidney disease Sister    Diabetes Paternal Grandmother  Hypertension Daughter    Diabetes Daughter    Hypertension Son    Diabetes Son    Breast cancer Paternal Aunt     Past Surgical History:  Procedure Laterality Date   ABDOMINAL HYSTERECTOMY     AV FISTULA PLACEMENT Left 12/09/2021   Procedure: LEFT ARTERIOVENOUS (AV) FISTULA CREATION;  Surgeon: Maeola Harman, MD;  Location: [facility] OR;  Service: Vascular;  Laterality: Left;   BIOPSY  09/08/2021   Procedure: BIOPSY;  Surgeon: Sherrilyn Rist, MD;  Location: [facility] ENDOSCOPY;  Service: Gastroenterology;;   CESAREAN SECTION     x 1   ESOPHAGOGASTRODUODENOSCOPY (EGD) WITH PROPOFOL N/A 09/08/2021   Procedure: ESOPHAGOGASTRODUODENOSCOPY (EGD) WITH PROPOFOL;  Surgeon: Sherrilyn Rist, MD;  Location: [facility] ENDOSCOPY;  Service: Gastroenterology;  Laterality: N/A;  chest pain, epigastric pain   INNER EAR SURGERY Bilateral    RIGHT HEART CATH N/A 05/14/2021   Procedure: RIGHT HEART CATH;  Surgeon: Laurey Morale, MD;  Location: [facility] INVASIVE CV LAB;  Service: Cardiovascular;  Laterality: N/A;   TONSILLECTOMY     age 84   Social History   Occupational History   Occupation: retired   Occupation: disabled  Tobacco Use   Smoking status: Never    Passive exposure: Never   Smokeless tobacco: Never  Vaping Use   Vaping  status: Never Used  Substance and Sexual Activity   Alcohol use: Never   Drug use: Never   Sexual activity: Not on file

## 2023-02-08 ENCOUNTER — Encounter: Payer: Self-pay | Admitting: Obstetrics and Gynecology

## 2023-02-08 ENCOUNTER — Other Ambulatory Visit: Payer: Medicaid Other | Admitting: Obstetrics and Gynecology

## 2023-02-08 DIAGNOSIS — F4321 Adjustment disorder with depressed mood: Secondary | ICD-10-CM | POA: Diagnosis not present

## 2023-02-08 NOTE — Patient Instructions (Signed)
Hi Ms. Reinsch great to speak with you-have a wonderful afternoon and week ahead!  Ms. Tomlinson was given information about Medicaid Managed Care team care coordination services as a part of their [facility] Medicaid benefit. Tresha Valdespino verbally consented to engagement with the [facility] Managed Care team.   If you are experiencing a medical emergency, please call 911 or report to your local emergency department or urgent care.   If you have a non-emergency medical problem during routine business hours, please contact your provider's office and ask to speak with a nurse.   For questions related to your [facility] health plan, please call: [phone] or go here:[email or web address]  If you would like to schedule transportation through your [facility] plan, please call the following number at least 2 days in advance of your appointment: [phone].   You can also use the MTM portal or MTM mobile app to manage your rides. Reimbursement for transportation is available through [facility]! For the portal, please go to [email or web address]  Call the [facility] Crisis Line at [phone], at any time, 24 hours a day, 7 days a week. If you are in danger or need immediate medical attention call 911.  If you would like help to quit smoking, call 1-800-QUIT-NOW (385-535-6917) OR Espaol: 1-855-Djelo-Ya (0-347-425-9563) o para ms informacin haga clic aqu or Text READY to 875-643 to register via text  Ms. Raska - following are the goals we discussed in your visit today:   Goals Addressed    Timeframe:  Long-Range Goal Priority:  High Start Date:     11/12/21                        Expected End Date:  ongoing                     Follow Up Date: 03/11/23   - schedule appointment for vaccines needed due to my age or health - schedule recommended health tests (blood work, mammogram, colonoscopy, pap test) - schedule and keep appointment for annual check-up    Why is this  important?   Screening tests can find diseases early when they are easier to treat.  Your doctor or nurse will talk with you about which tests are important for you.  Getting shots for common diseases like the flu and shingles will help prevent them.  02/08/23:  patient seen and evaluated by ORTHO and CARDS.  Has ENT appt and f/u ORTHO appt scheduled.  To f/u on PT referral.  [facility] weekly visits continue.  Patient verbalizes understanding of instructions and care plan provided today and agrees to view in MyChart. Active MyChart status and patient understanding of how to access instructions and care plan via MyChart confirmed with patient.     The Managed Medicaid care management team will reach out to the patient again over the next 30 business  days.  The  Patient  has been provided with contact information for the Managed Medicaid care management team and has been advised to call with any health related questions or concerns.   Kathi Der RN, BSN [place]  [facility] Care Management Coordinator - Managed Medicaid High Risk [phone]   Following is a copy of your plan of care:  Care Plan : RN Care Manager Plan Of Care  Updates made by [name], RN since 02/08/2023 12:00 AM     Problem: Knowledge Deficit and Care Coordination Needs Related to Management of  HTN, HF, Type 2 DM   Priority: High     Long-Range Goal: Development of Plan of Care to Address Knowledge Deficits and Care Coordination Needs related to  management of CHF, CAD, HTN, HLD, DMII, CKD Stage 4, and Depression   Start Date: 05/06/2021  Expected End Date: 04/10/2023  Priority: High  Note:   Current Barriers:  Knowledge Deficits related to plan of care for management of CHF, CAD, HTN, HLD, DMII, CKD Stage 4, and Depression 02/08/23:  BP more stabilized-medication adjusted by CARDS 8/20-140-180/70-90.  Blood sugar 95-180.  Has ENT appt and to start PT for back.  Continues therapy with  [facility] every week.  To schedule dental appt.  RNCM Clinical Goal(s):  Patient will demonstrate ongoing adherence to prescribed treatment plan for CHF, CAD, HTN, HLD, DMII, CKD Stage 4, and Depression as evidenced by patient report of improved health and quality of life and no unplanned ED or unplanned hospital admissions continue to work with RN Care Manager and/or Social Worker to address care management and care coordination needs related to CHF, CAD, HTN, HLD, DMII, CKD Stage 4, and Depression as evidenced by adherence to CM Team Scheduled appointments      Work with [facility] LCSW and psychiatrist to treat depression  Interventions: Inter-disciplinary care team collaboration (see longitudinal plan of care) Evaluation of current treatment plan related to  self management and patient's adherence to plan as established by provider Mailed summary of benefits for her [facility] Managed Medicaid plan to her home address Collaborated with Care Guide for dental resources-completed. Care Guide referral for dental resources Collaborated with BSW for assistance with [facility] appt.-completed. BSW referral for appt assistance. Collaborated with Pharmacy regarding  uncontrolled HTN Pharmacy referral for uncontrolled HTN-completed. Collaborated with LCSW for depression LCSW referral for depression-completed BSW referral for dental and PCP resources-completed. Collaborated with BSW  CAD Interventions: (Status:  New goal.) Long Term Goal Assessed understanding of CAD diagnosis Medications reviewed including medications utilized in CAD treatment plan Provided education on importance of blood pressure control in management of CAD Counseled on importance of regular laboratory monitoring as prescribed Reviewed Importance of taking all medications as prescribed Reviewed Importance of attending all scheduled provider appointments Advised to report any changes in symptoms or exercise  tolerance Assessed social determinant of health barriers  Hyperlipidemia Interventions:  (Status:  New goal.) Long Term Goal Medication review performed; medication list updated in electronic medical record.  Provider established cholesterol goals reviewed Counseled on importance of regular laboratory monitoring as prescribed Reviewed importance of limiting foods high in cholesterol Assessed social determinant of health barriers     Heart Failure Interventions:  (Status: Goal on Track (progressing): YES.)  Long Term Goal - patient states her fluid retention has significantly improved,  says she cannot tolerate the compression stockings because they are too tight and make her legs hurt Assessed patient's heart failure management strategies  Diabetes:  (Status: Goal on Track (progressing): YES.) Long Term Goal   Lab Results  Component Value Date   HGBA1C 6.7 (H) 04/29/2021   Hgn A1C=6.3 on 12/26/21 Hgb A1C=5.2 in October per patient HGBA1C=5.8 in March per patient HGBA1C=6.1 on 12/06/24  Assessed patient's understanding of A1C goal: <7% Reviewed medications with patient and discussed importance of medication adherence;        Counseled on importance of regular laboratory monitoring as prescribed;        Discussed plans with patient for ongoing care management follow up and provided patient with  direct contact information for care management team;      Advised patient, providing education and rationale, to check cbg three times daily and record        call provider for findings outside established parameters;       Review of patient status, including review of consultants reports, relevant laboratory and other test results, and medications completed;       Discussed addition of Ozempic to medication regimen , reviewed mechanism of action and proven cardiovascular benefits To schedule ENDO appt-discussed nutrition referral, [facility]-ENDO appt scheduled-patient  declines referral.  Hypertension and CKD stage 4 : (Status: Goal on Track (progressing): YES.) Long Term Goal-  has been placed on kidney transplant waiting list, fistu;a placed 12/09/21,ready for use 03/11/22.  BP Readings from Last 3 Encounters:        02/02/23 164/70  12/09/22         136-148/62-72 01/08/23         126-178/66-85    Most recent eGFR/CrCl:    No components found for: CRCL Lab Results  Component Value Date   NA 136 08/19/2021   K 3.9 08/19/2021   CREATININE 2.51 (H) 08/19/2021   EGFR 19 (L) 03/13/2021   GFRNONAA 22 (L) 08/19/2021   GLUCOSE 122 (H) 08/19/2021    Evaluation of current treatment plan related to hypertension self management and patient's adherence to plan as established by provider;   Reviewed medications with patient and discussed importance of compliance;  Discussed plans with patient for ongoing care management follow up and provided patient with direct contact information for care management team; Advised patient, providing education and rationale, to monitor blood pressure daily and record, calling PCP for findings outside established parameters;  Reviewed scheduled/upcoming provider appointments   Weight Loss Interventions:  (Status:  New goal.) Long Term Goal--patient has started Ozempic Discussed indications, mechanism of action, common side effects and CV benefits  of GLP1 Ozempic Assessed tolerance of Ozempic  Interdisciplinary Collaboration:  (Status: Goal on Track (progressing): YES.) Long Term Goal  Collaborated with BSW to initiate plan of care to address needs related to Housing barriers, Inability to perform ADL's independently, and Inability to perform IADL's independently in patient with CHF, CAD, HTN, HLD, DM, CKD Stage 4, and Depression Assessed status of patient's involvement with behavioral health counselor Collaboration with pharmacist for medication review, discussion of ? Medication side effects causing chronic constipation and  arranging for patient to received home delivery of medications in blister packs  Patient Goals/Self-Care Activities: Take medications as prescribed   Attend all scheduled provider appointments Call pharmacy for medication refills 3-7 days in advance of running out of medications Attend church or other social activities Perform all self care activities independently  Call provider office for new concerns or questions  Work with the social worker to address care coordination needs and will continue to work with the clinical team to address health care and disease management related needs call office if I gain more than 2 pounds in one day or 5 pounds in one week keep legs up while sitting track weight in diary use salt in moderation watch for swelling in feet, ankles and legs every day weigh myself daily know when to call the doctorfor fluid weight gain or consistently abnormal BP readings at home track symptoms and what helps feel better or worse check blood sugar at prescribed times: three times daily enter blood sugar readings and medication or insulin into daily log take the  blood sugar log to all doctor visits take the blood sugar meter to all doctor visits eat fish at least once per week fill half of plate with vegetables manage portion size check blood pressure daily write blood pressure results in a log or diary learn about high blood pressure take blood pressure log to all doctor appointments call doctor for signs and symptoms of high blood pressure keep all doctor appointments take medications for blood pressure exactly as prescribed Limit fluid intake to 1 liter per day Collaborate with managed Medicaid team of BSW and pharmacist to address identified needs Work with cardiology pharmacist to assist with BP stabilization Work with [facility] LCSW and psychiatrist to treat depression Review summary of [employer] benefits mailed to home address and call to enroll in program that  will benefit your chronic health issues Continue to use [facility] Care Management calendar to record weight , BP and CBGs so readings are all in one place Schedule dental appt Schedule ENDO appt-completed. Patient to follow up on ENT referral-completed To schedule dentist appt

## 2023-02-08 NOTE — Patient Outreach (Signed)
Medicaid Managed Care   Nurse Care Manager Note  02/08/2023 Name:  Maria Hudson MRN:  [id number] DOB:  [birth year]  Maria Hudson is an 62 y.o. year old female who is a primary patient of Fleet Contras, MD.  The [facility] Managed Care Coordination team was consulted for assistance with:    Chronic healthcare management needs, HTN, retinopathy, chronic pain, depression, CHF, gout, OSA, neuropathy, h/o CVA, CKD, DM, CAD  [name] was given information about Medicaid Managed Care Coordination team services today. [name] Patient agreed to services and verbal consent obtained.  [name] for follow up visit in response to provider referral for case management and/or care coordination services.   Assessments/Interventions:  Review of past medical history, allergies, medications, health status, including review of consultants reports, laboratory and other test data, was performed as part of comprehensive evaluation and provision of chronic care management services.  SDOH (Social Determinants of Health) assessments and interventions performed: SDOH Interventions    Flowsheet Row Patient Outreach Telephone from 02/08/2023 in [place] POPULATION HEALTH DEPARTMENT Patient Outreach Telephone from 01/08/2023 in [place] POPULATION HEALTH DEPARTMENT Patient Outreach Telephone from 12/09/2022 in [place] POPULATION HEALTH DEPARTMENT Patient Outreach Telephone from 11/05/2022 in [place] POPULATION HEALTH DEPARTMENT Patient Outreach Telephone from 11/02/2022 in [place] POPULATION HEALTH DEPARTMENT Patient Outreach Telephone from 10/06/2022 in [place] POPULATION HEALTH DEPARTMENT  SDOH Interventions        Food Insecurity Interventions -- -- -- Intervention Not Indicated -- --  Housing Interventions -- -- Intervention Not Indicated -- -- --  Transportation Interventions -- Intervention Not Indicated -- -- -- --  Utilities Interventions Intervention Not Indicated  -- -- -- -- --  Alcohol Usage Interventions -- -- -- Intervention Not Indicated (Score <7) -- --  Depression Interventions/Treatment  -- -- Counseling, Medication  [patient speaks with [facility] every week] -- -- --  Financial Strain Interventions Intervention Not Indicated -- -- -- -- --  Physical Activity Interventions -- -- -- -- -- Other (Comments)  [not phyasically able to enage in a moderate to strenuous exercise routine]  Stress Interventions -- -- -- -- [employer], Provide Counseling --  Health Literacy Interventions -- Intervention Not Indicated -- -- -- --     Care Plan Allergies  Allergen Reactions   Metformin And Related Nausea Only   Other     Clonidine patch causes skin irritation    Hydrocodone Itching    Patient able to tolerate when taken with Benadryl.   Oxycodone-Acetaminophen Itching    Patient able to tolerate when taken with Benadryl.    Medications Reviewed Today     Reviewed by [name], RN (Registered Nurse) on 02/08/23 at 1446  Med List Status: <None>   Medication Order Taking? Sig Documenting Provider Last Dose Status Informant  Accu-Chek Softclix Lancets lancets [id number] No 3 (three) times daily. [provider] Taking Active Self  acetaminophen (TYLENOL) 500 MG tablet [id number] No Take 1,000 mg by mouth every 6 (six) hours as needed for moderate pain or headache. [provider] Taking Active Self  allopurinol (ZYLOPRIM) 100 MG tablet [id number] No Take 100 mg by mouth in the morning. [provider] Taking Active Self  amLODipine (NORVASC) 10 MG tablet [id number] No Take 10 mg by mouth daily. [provider] Taking Active   aspirin EC 81 MG tablet [id number] No Take 81 mg by mouth in the morning. Swallow whole. [provider] Taking Active  Self           Med Note [name]   Mon Apr 14, 2021  9:22 PM)    atorvastatin (LIPITOR) 40 MG tablet [id number] No Take 1 tablet  (40 mg total) by mouth every evening. Chilton Si, MD Taking Active Self  buPROPion [facility]) 100 MG tablet [id number] No Take 100 mg by mouth 2 (two) times daily. [provider] Taking Active Self  carvedilol (COREG) 25 MG tablet [id number] No Take 1 tablet (25 mg total) by mouth 2 (two) times daily with a meal. [name], York Ram, MD Taking Active Self  cholecalciferol (VITAMIN D3) 25 MCG (1000 UNIT) tablet [id number] No Take 2,000 Units by mouth daily. [provider] Taking Active Self  cloNIDine (CATAPRES) 0.2 MG tablet [id number]  Take 2 tablets (0.4 mg total) by mouth 3 (three) times daily. Tamora, [place], FNP  Active   Continuous Blood Gluc Transmit (DEXCOM G6 TRANSMITTER) MISC [id number] No Use one every 3 month [provider] Taking Active   cyclobenzaprine (FLEXERIL) 10 MG tablet [id number]  Take 1 tablet (10 mg total) by mouth at bedtime. Kirtland Bouchard, PA-C  Active   dapagliflozin propanediol (FARXIGA) 10 MG TABS tablet [id number] No Take 1 tablet (10 mg total) by mouth daily before breakfast. Jacklynn Ganong, FNP Taking Active   ferrous sulfate 325 (65 FE) MG tablet [id number] No Take 325 mg by mouth 2 (two) times daily. [provider] Taking Active Self  fluticasone (FLONASE) 50 MCG/ACT nasal spray [id number] No Place 1 spray into both nostrils daily as needed for allergies or rhinitis. [provider] Taking Active Self  hydrALAZINE (APRESOLINE) 100 MG tablet [id number] No TAKE 1 TABLET BY MOUTH 3 TIMES DAILY. Chilton Si, MD Taking Active   insulin glargine (LANTUS) 100 unit/mL SOPN [id number] No Inject 10 Units into the skin at bedtime. [provider] Taking Active Self           Med Note [name], [name]   Tue Oct 13, 2022  9:23 AM)    isosorbide mononitrate (IMDUR) 120 MG 24 hr tablet [id number] No Take 120 mg by mouth daily. [provider] Taking Active   linaclotide Karlene Einstein) 145  MCG CAPS capsule [id number] No Take 1 capsule (145 mcg total) by mouth daily before breakfast. Zehr, Princella Pellegrini, PA-C Taking Active   loratadine (CLARITIN) 10 MG tablet [id number] No Take 10 mg by mouth in the morning. [provider] Taking Active Self  Multiple Vitamin (MULTIVITAMIN ADULT) TABS [id number] No Take 1 tablet by mouth in the morning. [provider] Taking Active Self  nitroGLYCERIN (NITROSTAT) 0.4 MG SL tablet [id number] No Place 1 tablet (0.4 mg total) under the tongue every 5 (five) minutes as needed for chest pain. [name], York Ram, MD Taking Active Self           Med Note Kittie Plater, Smitty Cords   Tue Aug 19, 2021  9:35 AM)    Rubbie Battiest FLEXPEN 100 UNIT/ML FlexPen [id number] No Inject 5 Units into the skin 3 (three) times daily with meals. [provider] Taking Active Self           Med Note [name], [name]   Tue Oct 13, 2022  9:23 AM)    Omega-3 Fatty Acids (FISH OIL) 1200 MG CAPS [id number] No Take 1,200 mg by mouth in the morning. [provider] Taking Active Self  pregabalin (LYRICA) 25 MG capsule [id number] No Take 25 mg by mouth 2 (two)  times daily. [provider] Taking Active Self  Semaglutide, 2 MG/DOSE, (OZEMPIC, 2 MG/DOSE,) 8 MG/3ML SOPN [id number] No Inject 2 mg into the skin once a week. Pavero, Cristal Deer, [facility] Taking Active   sertraline (ZOLOFT) 50 MG tablet [id number] No Take 50 mg by mouth in the morning. [provider] Taking Active Self  sodium phosphate (FLEET) 7-19 GM/118ML enema 1 enema [id number]   Sherrilyn Rist, MD  Active   spironolactone (ALDACTONE) 50 MG tablet [id number] No Take 50 mg by mouth daily. [provider] Taking Active   torsemide (DEMADEX) 20 MG tablet [id number] No Take 1 tablet (20 mg total) by mouth daily as needed.  Patient taking differently: Take 20 mg by mouth daily as needed. Patient takes 1 tablet daily   [place], Anderson [place], [place] Taking Active   TURMERIC  CURCUMIN PO [id number] No Take 500 mg by mouth daily at 6 (six) AM. [provider] Taking Active            Patient Active Problem List   Diagnosis Date Noted   History of colonic polyps 12/28/2022   Chronic constipation 03/31/2022   Pain due to onychomycosis of toenails of both feet 12/05/2021   Pure hypercholesterolemia 05/27/2021   Chronic diastolic CHF (congestive heart failure) (HCC) 04/29/2021   Hypertensive urgency 04/14/2021   Acute on chronic diastolic CHF (congestive heart failure) (HCC) 04/14/2021   Chest pain 03/05/2021   CAD (coronary artery disease) 03/05/2021   T2DM (type 2 diabetes mellitus) (HCC) 03/05/2021   CKD (chronic kidney disease) stage 4, GFR 15-29 ml/min (HCC) 03/05/2021   Class 3 obesity (HCC) 03/05/2021   CVA (cerebral vascular accident) (HCC) 03/05/2021   Acute on chronic diastolic heart failure (HCC) 03/05/2021   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   AKI (acute kidney injury) (HCC) 03/05/2021   Vitreous hemorrhage of left eye (HCC) 11/14/2020   Orthopnea 08/06/2020   Vision loss of right eye 08/06/2020   Excessive daytime sleepiness 01/11/2020   Iron deficiency anemia 12/14/2019   Vitreous hemorrhage, right (HCC) 12/04/2019   Diabetic neuropathy (HCC) 07/26/2019   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) 02/03/2019   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   Conductive hearing loss, bilateral 03/03/2018   Chronic bilateral low back pain without sciatica 08/21/2015   Chronic kidney disease (CKD) stage G3b/A3, moderately decreased glomerular filtration rate (GFR) between 30-44 mL/min/1.73 square meter and albuminuria creatinine ratio greater than 300 mg/g (HCC) 04/30/2015   Type 2 diabetes mellitus with diabetic polyneuropathy, with long-term current use of insulin (HCC) 02/15/2015   Advance directive on file 05/17/2014   Refusal of blood transfusions as patient is Jehovah's  Witness 05/17/2014   OSA treated with BiPAP 02/23/2014   Coronary artery disease 07/07/2013   Severe obesity (BMI >= 40) (HCC) 05/18/2013   Neuropathy 04/11/2013   Diastolic heart failure (HCC) 10/16/2012   Essential hypertension 10/16/2012   Conditions to be addressed/monitored per PCP order:  Chronic healthcare management needs, HTN, retinopathy, chronic pain, depression, CHF, gout, OSA, neuropathy, h/o CVA, CKD, DM, CAD  Care Plan : RN Care Manager Plan Of Care  Updates made by [name], RN since 02/08/2023 12:00 AM     Problem: Knowledge Deficit and Care Coordination Needs Related to Management of HTN, HF, Type 2 DM   Priority: High     Long-Range Goal: Development of Plan of Care to Address Knowledge Deficits and Care Coordination Needs related to  management of CHF, CAD, HTN, HLD, DMII, CKD Stage 4, and Depression   Start Date: 05/06/2021  Expected End Date: 04/10/2023  Priority: High  Note:   Current Barriers:  Knowledge Deficits related to plan of care for management of CHF, CAD, HTN, HLD, DMII, CKD Stage 4, and Depression 02/08/23:  BP more stabilized-medication adjusted by CARDS 8/20-140-180/70-90.  Blood sugar 95-180.  Has ENT appt and to start PT for back.  Continues therapy with [facility] every week.  To schedule dental appt.  RNCM Clinical Goal(s):  Patient will demonstrate ongoing adherence to prescribed treatment plan for CHF, CAD, HTN, HLD, DMII, CKD Stage 4, and Depression as evidenced by patient report of improved health and quality of life and no unplanned ED or unplanned hospital admissions continue to work with RN Care Manager and/or Social Worker to address care management and care coordination needs related to CHF, CAD, HTN, HLD, DMII, CKD Stage 4, and Depression as evidenced by adherence to CM Team Scheduled appointments      Work with [facility] LCSW and psychiatrist to treat depression  Interventions: Inter-disciplinary care team collaboration (see  longitudinal plan of care) Evaluation of current treatment plan related to  self management and patient's adherence to plan as established by provider Mailed summary of benefits for her [facility] Managed Medicaid plan to her home address Collaborated with Care Guide for dental resources-completed. Care Guide referral for dental resources Collaborated with BSW for assistance with [facility] [place] appt.-completed. BSW referral for appt assistance. Collaborated with Pharmacy regarding  uncontrolled HTN Pharmacy referral for uncontrolled HTN-completed. Collaborated with LCSW for depression LCSW referral for depression-completed BSW referral for dental and PCP resources-completed. Collaborated with BSW  CAD Interventions: (Status:  New goal.) Long Term Goal Assessed understanding of CAD diagnosis Medications reviewed including medications utilized in CAD treatment plan Provided education on importance of blood pressure control in management of CAD Counseled on importance of regular laboratory monitoring as prescribed Reviewed Importance of taking all medications as prescribed Reviewed Importance of attending all scheduled provider appointments Advised to report any changes in symptoms or exercise tolerance Assessed social determinant of health barriers  Hyperlipidemia Interventions:  (Status:  New goal.) Long Term Goal Medication review performed; medication list updated in electronic medical record.  Provider established cholesterol goals reviewed Counseled on importance of regular laboratory monitoring as prescribed Reviewed importance of limiting foods high in cholesterol Assessed social determinant of health barriers     Heart Failure Interventions:  (Status: Goal on Track (progressing): YES.)  Long Term Goal - patient states her fluid retention has significantly improved,  says she cannot tolerate the compression stockings because they are too tight and make her legs hurt Assessed  patient's heart failure management strategies  Diabetes:  (Status: Goal on Track (progressing): YES.) Long Term Goal   Lab Results  Component Value Date   HGBA1C 6.7 (H) 04/29/2021   Hgn A1C=6.3 on 12/26/21 Hgb A1C=5.2 in October per patient HGBA1C=5.8 in March per patient HGBA1C=6.1 on 12/06/24  Assessed patient's understanding of A1C goal: <7% Reviewed medications with patient and discussed importance of medication adherence;        Counseled on importance of regular laboratory monitoring as prescribed;        Discussed plans with patient for ongoing care management follow up and provided patient with direct contact information for care management team;      Advised patient, providing education and rationale, to check cbg three times daily and record  call provider for findings outside established parameters;       Review of patient status, including review of consultants reports, relevant laboratory and other test results, and medications completed;       Discussed addition of Ozempic to medication regimen , reviewed mechanism of action and proven cardiovascular benefits To schedule ENDO appt-discussed nutrition referral, [facility]-ENDO appt scheduled-patient declines referral.  Hypertension and CKD stage 4 : (Status: Goal on Track (progressing): YES.) Long Term Goal-  has been placed on kidney transplant waiting list, fistu;a placed 12/09/21,ready for use 03/11/22.  BP Readings from Last 3 Encounters:        02/02/23 164/70  12/09/22         136-148/62-72 01/08/23         126-178/66-85    Most recent eGFR/CrCl:    No components found for: CRCL Lab Results  Component Value Date   NA 136 08/19/2021   K 3.9 08/19/2021   CREATININE 2.51 (H) 08/19/2021   EGFR 19 (L) 03/13/2021   GFRNONAA 22 (L) 08/19/2021   GLUCOSE 122 (H) 08/19/2021    Evaluation of current treatment plan related to hypertension self management and patient's adherence to plan as  established by provider;   Reviewed medications with patient and discussed importance of compliance;  Discussed plans with patient for ongoing care management follow up and provided patient with direct contact information for care management team; Advised patient, providing education and rationale, to monitor blood pressure daily and record, calling PCP for findings outside established parameters;  Reviewed scheduled/upcoming provider appointments   Weight Loss Interventions:  (Status:  New goal.) Long Term Goal--patient has started Ozempic Discussed indications, mechanism of action, common side effects and CV benefits  of GLP1 Ozempic Assessed tolerance of Ozempic  Interdisciplinary Collaboration:  (Status: Goal on Track (progressing): YES.) Long Term Goal  Collaborated with BSW to initiate plan of care to address needs related to Housing barriers, Inability to perform ADL's independently, and Inability to perform IADL's independently in patient with CHF, CAD, HTN, HLD, DM, CKD Stage 4, and Depression Assessed status of patient's involvement with behavioral health counselor Collaboration with pharmacist for medication review, discussion of ? Medication side effects causing chronic constipation and arranging for patient to received home delivery of medications in blister packs  Patient Goals/Self-Care Activities: Take medications as prescribed   Attend all scheduled provider appointments Call pharmacy for medication refills 3-7 days in advance of running out of medications Attend church or other social activities Perform all self care activities independently  Call provider office for new concerns or questions  Work with the social worker to address care coordination needs and will continue to work with the clinical team to address health care and disease management related needs call office if I gain more than 2 pounds in one day or 5 pounds in one week keep legs up while sitting track  weight in diary use salt in moderation watch for swelling in feet, ankles and legs every day weigh myself daily know when to call the doctorfor fluid weight gain or consistently abnormal BP readings at home track symptoms and what helps feel better or worse check blood sugar at prescribed times: three times daily enter blood sugar readings and medication or insulin into daily log take the blood sugar log to all doctor visits take the blood sugar meter to all doctor visits eat fish at least once per week fill half of plate with vegetables manage portion size check  blood pressure daily write blood pressure results in a log or diary learn about high blood pressure take blood pressure log to all doctor appointments call doctor for signs and symptoms of high blood pressure keep all doctor appointments take medications for blood pressure exactly as prescribed Limit fluid intake to 1 liter per day Collaborate with managed Medicaid team of BSW and pharmacist to address identified needs Work with cardiology pharmacist to assist with BP stabilization Work with [facility] LCSW and psychiatrist to treat depression Review summary of [employer] benefits mailed to home address and call to enroll in program that will benefit your chronic health issues Continue to use [facility] Care Management calendar to record weight , BP and CBGs so readings are all in one place Schedule dental appt Schedule ENDO appt-completed. Patient to follow up on ENT referral-completed To schedule dentist appt   Long-Range Goal: Establish Plan of Care for Chronic Disease Management Needs   Priority: High  Note:   Timeframe:  Long-Range Goal Priority:  High Start Date:     11/12/21                        Expected End Date:  ongoing                     Follow Up Date: 03/11/23   - schedule appointment for vaccines needed due to my age or health - schedule recommended health tests (blood work, mammogram,  colonoscopy, pap test) - schedule and keep appointment for annual check-up    Why is this important?   Screening tests can find diseases early when they are easier to treat.  Your doctor or nurse will talk with you about which tests are important for you.  Getting shots for common diseases like the flu and shingles will help prevent them.  02/08/23:  patient seen and evaluated by ORTHO and CARDS.  Has ENT appt and f/u ORTHO appt scheduled.  To f/u on PT referral.  [facility] weekly visits continue.   Follow Up:  Patient agrees to Care Plan and Follow-up.  Plan: The Managed Medicaid care management team will reach out to the patient again over the next 30 business  days. and The  Patient has been provided with contact information for the Managed Medicaid care management team and has been advised to call with any health related questions or concerns.  Date/time of next scheduled RN care management/care coordination outreach: 03/11/23 at 115.

## 2023-02-12 ENCOUNTER — Telehealth (HOSPITAL_COMMUNITY): Payer: Self-pay | Admitting: *Deleted

## 2023-02-12 NOTE — Telephone Encounter (Signed)
Reminder calls given for upcoming Amyloid study on 02/17/23 at 12:30

## 2023-02-14 DIAGNOSIS — Z419 Encounter for procedure for purposes other than remedying health state, unspecified: Secondary | ICD-10-CM | POA: Diagnosis not present

## 2023-02-17 ENCOUNTER — Ambulatory Visit (HOSPITAL_COMMUNITY): Payer: Medicaid Other

## 2023-02-17 DIAGNOSIS — Z1239 Encounter for other screening for malignant neoplasm of breast: Secondary | ICD-10-CM | POA: Diagnosis not present

## 2023-02-17 DIAGNOSIS — I509 Heart failure, unspecified: Secondary | ICD-10-CM | POA: Diagnosis not present

## 2023-02-17 DIAGNOSIS — E2839 Other primary ovarian failure: Secondary | ICD-10-CM | POA: Diagnosis not present

## 2023-02-17 DIAGNOSIS — F329 Major depressive disorder, single episode, unspecified: Secondary | ICD-10-CM | POA: Diagnosis not present

## 2023-02-17 DIAGNOSIS — Z0000 Encounter for general adult medical examination without abnormal findings: Secondary | ICD-10-CM | POA: Diagnosis not present

## 2023-02-17 DIAGNOSIS — E1122 Type 2 diabetes mellitus with diabetic chronic kidney disease: Secondary | ICD-10-CM | POA: Diagnosis not present

## 2023-02-17 DIAGNOSIS — M10062 Idiopathic gout, left knee: Secondary | ICD-10-CM | POA: Diagnosis not present

## 2023-02-17 DIAGNOSIS — M19012 Primary osteoarthritis, left shoulder: Secondary | ICD-10-CM | POA: Diagnosis not present

## 2023-02-17 DIAGNOSIS — N184 Chronic kidney disease, stage 4 (severe): Secondary | ICD-10-CM | POA: Diagnosis not present

## 2023-02-17 DIAGNOSIS — I10 Essential (primary) hypertension: Secondary | ICD-10-CM | POA: Diagnosis not present

## 2023-02-19 ENCOUNTER — Ambulatory Visit (HOSPITAL_COMMUNITY): Payer: Medicaid Other

## 2023-02-22 ENCOUNTER — Ambulatory Visit (HOSPITAL_COMMUNITY): Payer: Medicaid Other | Attending: Family Medicine

## 2023-02-22 ENCOUNTER — Other Ambulatory Visit: Payer: Self-pay

## 2023-02-22 DIAGNOSIS — F4321 Adjustment disorder with depressed mood: Secondary | ICD-10-CM | POA: Diagnosis not present

## 2023-02-22 DIAGNOSIS — E559 Vitamin D deficiency, unspecified: Secondary | ICD-10-CM | POA: Diagnosis not present

## 2023-02-22 DIAGNOSIS — I5032 Chronic diastolic (congestive) heart failure: Secondary | ICD-10-CM | POA: Diagnosis not present

## 2023-02-22 DIAGNOSIS — M10062 Idiopathic gout, left knee: Secondary | ICD-10-CM | POA: Diagnosis not present

## 2023-02-22 DIAGNOSIS — Z0000 Encounter for general adult medical examination without abnormal findings: Secondary | ICD-10-CM | POA: Diagnosis not present

## 2023-02-22 DIAGNOSIS — E1122 Type 2 diabetes mellitus with diabetic chronic kidney disease: Secondary | ICD-10-CM | POA: Diagnosis not present

## 2023-02-22 LAB — MYOCARDIAL AMYLOID PLANAR & SPECT: H/CL Ratio: 1.22

## 2023-02-22 MED ORDER — TECHNETIUM TC 99M PYROPHOSPHATE: 21.2000 | Freq: Once | INTRAVENOUS | Status: AC

## 2023-02-22 MED ORDER — TECHNETIUM TC 99M PYROPHOSPHATE
21.2000 | Freq: Once | INTRAVENOUS | Status: AC
  Administered 2023-02-22: 21.2 via INTRAVENOUS

## 2023-02-23 ENCOUNTER — Encounter (HOSPITAL_COMMUNITY): Payer: Medicaid Other

## 2023-02-23 DIAGNOSIS — I5032 Chronic diastolic (congestive) heart failure: Secondary | ICD-10-CM | POA: Diagnosis not present

## 2023-02-23 DIAGNOSIS — N184 Chronic kidney disease, stage 4 (severe): Secondary | ICD-10-CM | POA: Diagnosis not present

## 2023-02-23 DIAGNOSIS — N2581 Secondary hyperparathyroidism of renal origin: Secondary | ICD-10-CM | POA: Diagnosis not present

## 2023-02-23 DIAGNOSIS — I129 Hypertensive chronic kidney disease with stage 1 through stage 4 chronic kidney disease, or unspecified chronic kidney disease: Secondary | ICD-10-CM | POA: Diagnosis not present

## 2023-02-23 DIAGNOSIS — E1122 Type 2 diabetes mellitus with diabetic chronic kidney disease: Secondary | ICD-10-CM | POA: Diagnosis not present

## 2023-02-23 LAB — LIPID PANEL W/O CHOL/HDL RATIO
Cholesterol, Total: 121 mg/dL (ref 100–199)
HDL: 52 mg/dL (ref >39)
LDL Chol Calc (NIH): 55 mg/dL (ref 0–99)
Triglycerides: 66 mg/dL (ref 0–149)
VLDL Cholesterol Cal: 14 mg/dL (ref 5–40)

## 2023-02-23 LAB — COMPREHENSIVE METABOLIC PANEL
ALT: 12 IU/L (ref 0–32)
AST: 21 IU/L (ref 0–40)
Albumin: 4 g/dL (ref 3.9–4.9)
Alkaline Phosphatase: 57 IU/L (ref 44–121)
BUN/Creatinine Ratio: 20 (ref 12–28)
BUN: 54 mg/dL — ABNORMAL HIGH (ref 8–27)
Bilirubin Total: <0.2 mg/dL (ref 0.0–1.2)
CO2: 17 mmol/L — ABNORMAL LOW (ref 20–29)
Calcium: 9 mg/dL (ref 8.7–10.3)
Chloride: 110 mmol/L — ABNORMAL HIGH (ref 96–106)
Creatinine, Ser: 2.67 mg/dL — ABNORMAL HIGH (ref 0.57–1.00)
Globulin, Total: 2.5 g/dL (ref 1.5–4.5)
Glucose: 103 mg/dL — ABNORMAL HIGH (ref 70–99)
Potassium: 3.9 mmol/L (ref 3.5–5.2)
Sodium: 144 mmol/L (ref 134–144)
Total Protein: 6.5 g/dL (ref 6.0–8.5)
eGFR: 20 mL/min/{1.73_m2} — ABNORMAL LOW (ref >59)

## 2023-02-23 LAB — CBC WITH DIFFERENTIAL/PLATELET
Basophils Absolute: 0.1 10*3/uL (ref 0.0–0.2)
Basos: 1 %
EOS (ABSOLUTE): 0.1 10*3/uL (ref 0.0–0.4)
Eos: 2 %
Hematocrit: 35.7 % (ref 34.0–46.6)
Hemoglobin: 10.9 g/dL — ABNORMAL LOW (ref 11.1–15.9)
Immature Grans (Abs): 0 10*3/uL (ref 0.0–0.1)
Immature Granulocytes: 0 %
Lymphocytes Absolute: 0.8 10*3/uL (ref 0.7–3.1)
Lymphs: 15 %
MCH: 27 pg (ref 26.6–33.0)
MCHC: 30.5 g/dL — ABNORMAL LOW (ref 31.5–35.7)
MCV: 89 fL (ref 79–97)
Monocytes Absolute: 0.4 10*3/uL (ref 0.1–0.9)
Monocytes: 8 %
Neutrophils Absolute: 4 10*3/uL (ref 1.4–7.0)
Neutrophils: 74 %
Platelets: 227 10*3/uL (ref 150–450)
RBC: 4.03 x10E6/uL (ref 3.77–5.28)
RDW: 15.4 % (ref 11.7–15.4)
WBC: 5.4 10*3/uL (ref 3.4–10.8)

## 2023-02-23 LAB — HGB A1C W/O EAG: Hgb A1c MFr Bld: 6.2 % — ABNORMAL HIGH (ref 4.8–5.6)

## 2023-02-23 LAB — VITAMIN D 25 HYDROXY (VIT D DEFICIENCY, FRACTURES): Vit D, 25-Hydroxy: 71.8 ng/mL (ref 30.0–100.0)

## 2023-02-23 LAB — URIC ACID: Uric Acid: 5.6 mg/dL (ref 3.0–7.2)

## 2023-02-23 LAB — TSH: TSH: 2.81 u[IU]/mL (ref 0.450–4.500)

## 2023-03-01 DIAGNOSIS — F4321 Adjustment disorder with depressed mood: Secondary | ICD-10-CM | POA: Diagnosis not present

## 2023-03-03 ENCOUNTER — Other Ambulatory Visit: Payer: Self-pay | Admitting: Cardiology

## 2023-03-08 ENCOUNTER — Ambulatory Visit: Payer: Medicaid Other | Admitting: Physical Therapy

## 2023-03-11 ENCOUNTER — Other Ambulatory Visit: Payer: Medicaid Other | Admitting: Obstetrics and Gynecology

## 2023-03-11 NOTE — Patient Instructions (Signed)
Hi Ms. Cotner, I am sorry I did not reach you today, I hope you are doing okay as a part of your Medicaid benefit, you are eligible for care management and care coordination services at no cost or copay. I was unable to reach you by phone today but would be happy to help you with your health related needs. Please feel free to call me at [phone]  A member of the Managed Medicaid care management team will reach out to you again over the next 30 business days.   Kathi Der RN, BSN [place]  [employer] - Managed Medicaid High Risk [phone]

## 2023-03-11 NOTE — Patient Outreach (Signed)
Medicaid Managed Care   Unsuccessful Attempt Note   03/11/2023 Name: Maria Hudson MRN: [id number] DOB: [birth year]  Referred by: Fleet Contras, MD Reason for referral : High Risk Managed Medicaid (Unsuccessful telephone outreach)  An unsuccessful telephone outreach was attempted today. The patient was referred to the case management team for assistance with care management and care coordination.    Follow Up Plan: The Managed Medicaid care management team will reach out to the patient again over the next 30 business  days. and The  Patient has been provided with contact information for the Managed Medicaid care management team and has been advised to call with any health related questions or concerns.  Kathi Der RN, BSN [place]  [employer] - Managed Medicaid High Risk [phone]

## 2023-03-12 ENCOUNTER — Other Ambulatory Visit: Payer: Medicaid Other | Admitting: Obstetrics and Gynecology

## 2023-03-12 NOTE — Patient Outreach (Signed)
Care Coordination  03/12/2023  Maria Hudson 06-Mar-1961 [id number]  RNCM returned patient's phone call, no answer, left message.  Kathi Der RN, BSN [place]  [employer] - Managed Medicaid High Risk [phone]

## 2023-03-16 DIAGNOSIS — Z419 Encounter for procedure for purposes other than remedying health state, unspecified: Secondary | ICD-10-CM | POA: Diagnosis not present

## 2023-03-17 ENCOUNTER — Other Ambulatory Visit: Payer: Medicaid Other

## 2023-03-17 ENCOUNTER — Encounter: Payer: Self-pay | Admitting: Orthopaedic Surgery

## 2023-03-17 ENCOUNTER — Ambulatory Visit: Payer: Medicaid Other | Admitting: Orthopaedic Surgery

## 2023-03-17 DIAGNOSIS — G8929 Other chronic pain: Secondary | ICD-10-CM | POA: Diagnosis not present

## 2023-03-17 DIAGNOSIS — M542 Cervicalgia: Secondary | ICD-10-CM

## 2023-03-17 DIAGNOSIS — M25512 Pain in left shoulder: Secondary | ICD-10-CM | POA: Diagnosis not present

## 2023-03-17 MED ORDER — LIDOCAINE HCL 1 % IJ SOLN
3.0000 mL | INTRAMUSCULAR | Status: AC | PRN
Start: 2023-03-17 — End: 2023-03-17
  Administered 2023-03-17: 3 mL

## 2023-03-17 MED ORDER — METHYLPREDNISOLONE ACETATE 40 MG/ML IJ SUSP
40.0000 mg | INTRAMUSCULAR | Status: AC | PRN
Start: 2023-03-17 — End: 2023-03-17
  Administered 2023-03-17: 40 mg via INTRA_ARTICULAR

## 2023-03-17 NOTE — Patient Outreach (Signed)
Medicaid Managed Care Social Work Note  03/17/2023 Name:  Maria Hudson MRN:  [id number] DOB:  Oct 03, 1960  Maria Hudson is an 62 y.o. year old female who is a primary patient of Fleet Contras, MD.  The Medicaid Managed Care Coordination team was consulted for assistance with:  [facility]   Ms. Martensen was given information about Medicaid Managed Care Coordination team services today. [name] Patient agreed to services and verbal consent obtained.  Engaged with patient  for by telephone forfollow up visit in response to referral for case management and/or care coordination services.   Assessments/Interventions:  Review of past medical history, allergies, medications, health status, including review of consultants reports, laboratory and other test data, was performed as part of comprehensive evaluation and provision of chronic care management services.  SDOH: (Social Determinant of Health) assessments and interventions performed: SDOH Interventions    Flowsheet Row Patient Outreach Telephone from 02/08/2023 in [place] POPULATION HEALTH DEPARTMENT Patient Outreach Telephone from 01/08/2023 in [place] POPULATION HEALTH DEPARTMENT Patient Outreach Telephone from 12/09/2022 in [place] POPULATION HEALTH DEPARTMENT Patient Outreach Telephone from 11/05/2022 in [place] POPULATION HEALTH DEPARTMENT Patient Outreach Telephone from 11/02/2022 in [place] POPULATION HEALTH DEPARTMENT Patient Outreach Telephone from 10/06/2022 in [place] POPULATION HEALTH DEPARTMENT  SDOH Interventions        Food Insecurity Interventions -- -- -- Intervention Not Indicated -- --  Housing Interventions -- -- Intervention Not Indicated -- -- --  Transportation Interventions -- Intervention Not Indicated -- -- -- --  Utilities Interventions Intervention Not Indicated -- -- -- -- --  Alcohol Usage Interventions -- -- -- Intervention Not Indicated (Score <7) -- --  Depression  Interventions/Treatment  -- -- Counseling, Medication  [patient speaks with [facility] every week] -- -- --  Financial Strain Interventions Intervention Not Indicated -- -- -- -- --  Physical Activity Interventions -- -- -- -- -- Other (Comments)  [not phyasically able to enage in a moderate to strenuous exercise routine]  Stress Interventions -- -- -- -- [employer], Provide Counseling --  Health Literacy Interventions -- Intervention Not Indicated -- -- -- --     BSW completed a telephone outreach with patient, she states everything is going well and not resources are needed at this time. BSW provided patient with her contact information for any future needs.  Advanced Directives Status:  Not addressed in this encounter.  Care Plan                 Allergies  Allergen Reactions   Metformin And Related Nausea Only   Other     Clonidine patch causes skin irritation    Hydrocodone Itching    Patient able to tolerate when taken with Benadryl.   Oxycodone-Acetaminophen Itching    Patient able to tolerate when taken with Benadryl.    Medications Reviewed Today   Medications were not reviewed in this encounter     Patient Active Problem List   Diagnosis Date Noted   History of colonic polyps 12/28/2022   Chronic constipation 03/31/2022   Pain due to onychomycosis of toenails of both feet 12/05/2021   Pure hypercholesterolemia 05/27/2021   Chronic diastolic CHF (congestive heart failure) (HCC) 04/29/2021   Hypertensive urgency 04/14/2021   Acute on chronic diastolic CHF (congestive heart failure) (HCC) 04/14/2021   Chest pain 03/05/2021   CAD (coronary artery disease) 03/05/2021   T2DM (type 2 diabetes mellitus) (HCC) 03/05/2021   CKD (chronic kidney disease) stage  4, GFR 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/min (HCC) 03/05/2021   Class 3 obesity 03/05/2021   CVA (cerebral vascular accident) (HCC) 03/05/2021   Acute on chronic diastolic heart failure (HCC) 03/05/2021    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   AKI (acute kidney injury) (HCC) 03/05/2021   Vitreous hemorrhage of left eye (HCC) 11/14/2020   Orthopnea 08/06/2020   Vision loss of right eye 08/06/2020   Excessive daytime sleepiness 01/11/2020   Iron deficiency anemia 12/14/2019   Vitreous hemorrhage, right (HCC) 12/04/2019   Diabetic neuropathy (HCC) 07/26/2019   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) 02/03/2019   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   Conductive hearing loss, bilateral 03/03/2018   Chronic bilateral low back pain without sciatica 08/21/2015   Chronic kidney disease (CKD) stage G3b/A3, moderately decreased glomerular filtration rate (GFR) between 30-44 mL/min/1.73 square meter and albuminuria creatinine ratio greater than 300 mg/g (HCC) 04/30/2015   Type 2 diabetes mellitus with diabetic polyneuropathy, with long-term current use of insulin (HCC) 02/15/2015   Advance directive on file 05/17/2014   Refusal of blood transfusions as patient is Jehovah's Witness 05/17/2014   OSA treated with BiPAP 02/23/2014   Coronary artery disease 07/07/2013   Severe obesity (BMI >= 40) (HCC) 05/18/2013   Neuropathy 04/11/2013   Diastolic heart failure (HCC) 10/16/2012   Essential hypertension 10/16/2012    Conditions to be addressed/monitored per PCP order:   community resources  There are no care plans that you recently modified to display for this patient.   Follow up:  Patient agrees to Care Plan and Follow-up.  Plan: The  Patient has been provided with contact information for the Managed Medicaid care management team and has been advised to call with any health related questions or concerns.    Abelino Derrick, MHA [facility]  Managed [facility] Social Worker [phone]

## 2023-03-17 NOTE — Progress Notes (Signed)
The patient comes in for follow-up today for left shoulder pain and neck pain.  This was 6 weeks after seeing her last in we were hoping to have her into physical therapy but she does not have her first appointment actually until tomorrow due to some communication issues with getting therapy set up.  She says her bigger complaint is left shoulder pain.  She has not had any type of injections.  On exam her left shoulder does show some signs of impingement but has good rotator cuff strength and no blocks to rotation but just some pain in the subacromial outlet.  Her cervical spine also shows some pain to the left side and some stiffness with rotation.  There are no radicular components and no weakness.  I did recommend a steroid injection in her left shoulder subacromial at today that will hopefully help her as she proceeds into physical therapy for her left shoulder and her cervical spine.  She agreed to having injection today and did tolerate it well.  Will see her back in 6 weeks after she completes a course of therapy for her cervical spine and her neck.    Procedure Note  Patient: Maria Hudson             Date of Birth: 02-27-1961           MRN: [id number]             Visit Date: 03/17/2023  Procedures: Visit Diagnoses:  1. Chronic left shoulder pain   2. Neck pain     Large Joint Inj: L subacromial bursa on 03/17/2023 9:20 AM Indications: pain and diagnostic evaluation Details: 22 G 1.5 in needle  Arthrogram: No  Medications: 3 mL lidocaine 1 %; 40 mg methylPREDNISolone acetate 40 MG/ML Outcome: tolerated well, no immediate complications Procedure, treatment alternatives, risks and benefits explained, specific risks discussed. Consent was given by the patient. Immediately prior to procedure a time out was called to verify the correct patient, procedure, equipment, support staff and site/side marked as required. Patient was prepped and draped in the usual sterile fashion.

## 2023-03-17 NOTE — Patient Instructions (Signed)
Visit Information  Maria Hudson was given information about Medicaid Managed Care team care coordination services as a part of their [facility] Medicaid benefit. Maria Hudson verbally consented to engagement with the [facility] Managed Care team.   If you are experiencing a medical emergency, please call 911 or report to your local emergency department or urgent care.   If you have a non-emergency medical problem during routine business hours, please contact your provider's office and ask to speak with a nurse.   For questions related to your [facility] health plan, please call: [phone] or go here:[email or web address]  If you would like to schedule transportation through your [facility] plan, please call the following number at least 2 days in advance of your appointment: [phone].   You can also use the MTM portal or MTM mobile app to manage your rides. Reimbursement for transportation is available through [facility]! For the portal, please go to [email or web address]  Call the [facility] Crisis Line at [phone], at any time, 24 hours a day, 7 days a week. If you are in danger or need immediate medical attention call 911.  If you would like help to quit smoking, call 1-800-QUIT-NOW (807-035-6711) OR Espaol: 1-855-Djelo-Ya (0-347-425-9563) o para ms informacin haga clic aqu or Text READY to 875-643 to register via text  Maria Hudson - following are the goals we discussed in your visit today:   Goals Addressed   None      The  Patient                                              has been provided with contact information for the Managed Medicaid care management team and has been advised to call with any health related questions or concerns.   Gus Puma, Kenard Gower, MHA [facility]  Managed Medicaid Social Worker [phone]   Following is a copy of your plan of care:  There are no care plans that you recently modified to display for this patient.

## 2023-03-18 ENCOUNTER — Ambulatory Visit: Payer: Medicaid Other | Attending: Physician Assistant | Admitting: Physical Therapy

## 2023-03-18 DIAGNOSIS — H43811 Vitreous degeneration, right eye: Secondary | ICD-10-CM | POA: Diagnosis not present

## 2023-03-18 DIAGNOSIS — H20042 Secondary noninfectious iridocyclitis, left eye: Secondary | ICD-10-CM | POA: Diagnosis not present

## 2023-03-18 DIAGNOSIS — M542 Cervicalgia: Secondary | ICD-10-CM | POA: Insufficient documentation

## 2023-03-18 DIAGNOSIS — H35033 Hypertensive retinopathy, bilateral: Secondary | ICD-10-CM | POA: Diagnosis not present

## 2023-03-18 DIAGNOSIS — M62830 Muscle spasm of back: Secondary | ICD-10-CM | POA: Diagnosis not present

## 2023-03-18 DIAGNOSIS — G8929 Other chronic pain: Secondary | ICD-10-CM | POA: Insufficient documentation

## 2023-03-18 DIAGNOSIS — R252 Cramp and spasm: Secondary | ICD-10-CM | POA: Insufficient documentation

## 2023-03-18 DIAGNOSIS — H31093 Other chorioretinal scars, bilateral: Secondary | ICD-10-CM | POA: Diagnosis not present

## 2023-03-18 DIAGNOSIS — H43391 Other vitreous opacities, right eye: Secondary | ICD-10-CM | POA: Diagnosis not present

## 2023-03-18 DIAGNOSIS — N184 Chronic kidney disease, stage 4 (severe): Secondary | ICD-10-CM | POA: Diagnosis not present

## 2023-03-18 DIAGNOSIS — M5459 Other low back pain: Secondary | ICD-10-CM | POA: Diagnosis not present

## 2023-03-18 DIAGNOSIS — H4311 Vitreous hemorrhage, right eye: Secondary | ICD-10-CM | POA: Diagnosis not present

## 2023-03-18 DIAGNOSIS — M25512 Pain in left shoulder: Secondary | ICD-10-CM | POA: Diagnosis not present

## 2023-03-18 DIAGNOSIS — E113513 Type 2 diabetes mellitus with proliferative diabetic retinopathy with macular edema, bilateral: Secondary | ICD-10-CM | POA: Diagnosis not present

## 2023-03-18 DIAGNOSIS — R262 Difficulty in walking, not elsewhere classified: Secondary | ICD-10-CM | POA: Insufficient documentation

## 2023-03-18 NOTE — Therapy (Signed)
OUTPATIENT PHYSICAL THERAPY SHOULDER/CERVICAL EVALUATION   Patient Name: Maria Hudson MRN: [id number] DOB:1960-10-22, 62 y.o., female Today's Date: 03/18/2023  END OF SESSION:  PT End of Session - 03/18/23 0953     Visit Number 1    Number of Visits 17    Date for PT Re-Evaluation 05/13/23    Authorization Type [place] [facility]    PT Start Time 0934    PT Stop Time 1019    PT Time Calculation (min) 45 min    Activity Tolerance Patient tolerated treatment well    Behavior During Therapy [facility] for tasks assessed/performed             Past Medical History:  Diagnosis Date   Acute on chronic diastolic heart failure (HCC) 03/05/2021   Anemia    Arthritis    CAD (coronary artery disease)    CHF (congestive heart failure) (HCC)    CKD (chronic kidney disease) stage 4, GFR 15-29 ml/min (HCC) 03/05/2021   Colon polyps    CVA (cerebral vascular accident) (HCC)    Depression    Diabetes mellitus without complication (HCC)    type 2   Dyspnea    Hypertension    Hypertensive urgency 04/14/2021   Memory loss    mild   OSA (obstructive sleep apnea) 05/27/2021   uses CPAP 12-18 cm   Pneumonia    several times   Pure hypercholesterolemia 05/27/2021   Renal disorder    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   Stroke [facility])    Past Surgical History:  Procedure Laterality Date   ABDOMINAL HYSTERECTOMY     AV FISTULA PLACEMENT Left 12/09/2021   Procedure: LEFT ARTERIOVENOUS (AV) FISTULA CREATION;  Surgeon: Maeola Harman, MD;  Location: [facility] OR;  Service: Vascular;  Laterality: Left;   BIOPSY  09/08/2021   Procedure: BIOPSY;  Surgeon: Sherrilyn Rist, MD;  Location: [facility] ENDOSCOPY;  Service: Gastroenterology;;   CESAREAN SECTION     x 1   ESOPHAGOGASTRODUODENOSCOPY (EGD) WITH PROPOFOL N/A 09/08/2021   Procedure: ESOPHAGOGASTRODUODENOSCOPY (EGD) WITH PROPOFOL;  Surgeon: Sherrilyn Rist, MD;  Location: [facility] ENDOSCOPY;  Service: Gastroenterology;  Laterality: N/A;  chest  pain, epigastric pain   INNER EAR SURGERY Bilateral    RIGHT HEART CATH N/A 05/14/2021   Procedure: RIGHT HEART CATH;  Surgeon: Laurey Morale, MD;  Location: [facility] INVASIVE CV LAB;  Service: Cardiovascular;  Laterality: N/A;   TONSILLECTOMY     age 32   Patient Active Problem List   Diagnosis Date Noted   History of colonic polyps 12/28/2022   Chronic constipation 03/31/2022   Pain due to onychomycosis of toenails of both feet 12/05/2021   Pure hypercholesterolemia 05/27/2021   Chronic diastolic CHF (congestive heart failure) (HCC) 04/29/2021   Hypertensive urgency 04/14/2021   Acute on chronic diastolic CHF (congestive heart failure) (HCC) 04/14/2021   Chest pain 03/05/2021   CAD (coronary artery disease) 03/05/2021   T2DM (type 2 diabetes mellitus) (HCC) 03/05/2021   CKD (chronic kidney disease) stage 4, GFR 15-29 ml/min (HCC) 03/05/2021   Class 3 obesity 03/05/2021   CVA (cerebral vascular accident) (HCC) 03/05/2021   Acute on chronic diastolic heart failure (HCC) 03/05/2021   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   AKI (acute kidney injury) (HCC) 03/05/2021   Vitreous hemorrhage of left eye (HCC) 11/14/2020   Orthopnea 08/06/2020   Vision loss of right eye 08/06/2020   Excessive daytime sleepiness 01/11/2020   Iron deficiency anemia 12/14/2019   Vitreous hemorrhage, right (HCC)  12/04/2019   Diabetic neuropathy (HCC) 07/26/2019   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) 02/03/2019   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   Conductive hearing loss, bilateral 03/03/2018   Chronic bilateral low back pain without sciatica 08/21/2015   Chronic kidney disease (CKD) stage G3b/A3, moderately decreased glomerular filtration rate (GFR) between 30-44 mL/min/1.73 square meter and albuminuria creatinine ratio greater than 300 mg/g (HCC) 04/30/2015   Type 2 diabetes mellitus with diabetic polyneuropathy, with long-term current  use of insulin (HCC) 02/15/2015   Advance directive on file 05/17/2014   Refusal of blood transfusions as patient is Jehovah's Witness 05/17/2014   OSA treated with BiPAP 02/23/2014   Coronary artery disease 07/07/2013   Severe obesity (BMI >= 40) (HCC) 05/18/2013   Neuropathy 04/11/2013   Diastolic heart failure (HCC) 10/16/2012   Essential hypertension 10/16/2012    PCP: Fleet Contras MD   REFERRING PROVIDER: Dr. Geraldine Solar, Allayne Gitelman, PA-C  REFERRING DIAG:  [phone] (ICD-10-CM) - Chronic left shoulder pain  M54.2 (ICD-10-CM) - Neck pain    THERAPY DIAG:  Cervicalgia  Chronic left shoulder pain  Cramp and spasm  Difficulty in walking, not elsewhere classified  Rationale for Evaluation and Treatment: Rehabilitation  ONSET DATE: chronic   SUBJECTIVE:                                                                                                                                                                                      SUBJECTIVE STATEMENT: Pt has had chronic L sided neck and shoulder pain, since 1996. She was working at [employer] and she slipped and fell a few times within a few days.  She has had pain since then.   She was not treated at that time. The doctor said it is likely coming from my neck. She had XR but no MRI.  Dr. Magnus Ivan injected her shoulder yesterday and no change as of today.   She reports difficulty walking, turning her head, using her L arm for home tasks and ADLs. "My whole L side is weaker"  Reports N/T L hand  Weaker in L UE  She also complains of bilateral hip and back pain, for which she is not being treated.  Does have issues with balance but not sure why.  She did have a stroke but reports that only affected her memory.  She is not sure if she has Neuropathy.      Hand dominance: Right  PERTINENT HISTORY: Diabetes, CVA, back pain     PAIN:  Are you having pain? Yes: NPRS scale: 4/10 Pain location: L neck  and back, L shoulder  Pain description: tight, stiff Aggravating factors: sleeping on L side, activity  Relieving factors: Tylenol, hot bath Headache often  PRECAUTIONS: None  RED FLAGS: None   WEIGHT BEARING RESTRICTIONS: No  FALLS:  Has patient fallen in last 6 months? No   LIVING ENVIRONMENT: Lives with: lives with their family Lives in: House/apartment Stairs: Yes: Internal: 12 steps; on left going up Has following equipment at home: Single point cane and Walker - 2 wheeled Used walker about a month (4 wheeled)   OCCUPATION: Not working right now "Little bit if everything"  PLOF: Independent, Independent with household mobility without device, Independent with community mobility with device, Needs assistance with ADLs, Leisure: family time, TV, cards and games, and not exercising.  I used to love to exercise.  She used to do at home on her own.   Pt does not have a bed.  Sleeps on a couch.  Walking is painful hips, low back.    PATIENT GOALS: I want to get out of pain.    NEXT MD VISIT:   OBJECTIVE:  Note: Objective measures were completed at Evaluation unless otherwise noted.  DIAGNOSTIC FINDINGS:  Left shoulder 3 views: Shoulder is well located.  Subacromial space  well-maintained.  Mild AC joint arthritic changes.  Glenohumeral joints  well-maintained.  No acute fractures bony abnormalities.  Cervical spine 2 views: No acute fracture.  Loss of lordotic curvature.   Diminished disc space C6 -C7.  No spondylolisthesis.  No acute fractures.   No other bony abnormalities.  PATIENT SURVEYS:  FOTO 43% neck, 43% shoulder   COGNITION: Overall cognitive status: History of cognitive impairments - at baseline memory per pt.      SENSATION: WFL   POSTURE: Forward head, somewhat rounded upper back   UPPER EXTREMITY ROM:  Active ROM Right eval Left eval  Shoulder flexion [facility]  WFL pain   Shoulder extension    Shoulder abduction WFL  120 pain   Shoulder  adduction    Shoulder internal rotation WFL Min pain reaches to low back   Shoulder external rotation WFL  Pain , limited but NT  Elbow flexion    Elbow extension    Wrist flexion    Wrist extension    Wrist ulnar deviation    Wrist radial deviation    Wrist pronation    Wrist supination    (Blank rows = not tested)   CERVICAL AROM  EVAL    flexion 30 deg    extension 50 deg    L SB  25    R SB  40    L rotation 25    R rotation  WNL          UPPER EXTREMITY MMT:  MMT Right eval Left eval  Shoulder flexion 4 4-  Shoulder extension    Shoulder abduction 4 4-  Shoulder adduction    Shoulder internal rotation NT 4  Shoulder external rotation NT  4  Middle trapezius    Lower trapezius    Elbow flexion  5  Elbow extension  4  Wrist flexion    Wrist extension    Wrist ulnar deviation    Wrist radial deviation    Wrist pronation    Wrist supination    Grip strength (lbs)    (Blank rows = not tested)  SHOULDER SPECIAL TESTS: NT  JOINT MOBILITY TESTING:  Unable to tolerate in cervical spine No stiffness in L GH joint  PALPATION:  Pain and spasm L upper trap   TODAY'S TREATMENT:                                                                                                                                         DATE:  03/18/23 PT eval  HEP Manual therapy to cervical spine with light suboccipitals and distraction     PATIENT EDUCATION: Education details: PT, POC, HEP  Person educated: Patient Education method: Programmer, multimedia, Demonstration, Verbal cues, and Handouts Education comprehension: verbalized understanding, returned demonstration, and needs further education  HOME EXERCISE PROGRAM: Access Code: [email or web address] URL: [email or web address] Date: 03/18/2023 Prepared by: Karie Mainland  Exercises - Seated Scapular Retraction  - 2 x daily - 7 x weekly - 2 sets - 10 reps - 5 hold - Seated Chin Tuck with Neck Elongation  - 2 x daily - 7 x  weekly - 2 sets - 10 reps - 5 hold - Seated Cervical Sidebending Stretch  - 2 x daily - 7 x weekly - 1 sets - 3-5 reps - 20-30 hold - Seated Cervical Rotation with Nod  - 1 x daily - 7 x weekly - 1 sets - 3-5 reps - 20-30 hold  ASSESSMENT:  CLINICAL IMPRESSION: Patient is a 62y.o. female who was seen today for physical therapy evaluation and treatment for cervical and left-sided shoulder pain.  Her symptoms are apparently coming from from her cervical spine.  She also has left-sided shoulder pain which could be related.  Her recent injection has not fully integrated at this time.  She also complains of significant back and hip pain, poor balance I recommend she mention this to her doctor as this significantly limits her mobility in her home.   OBJECTIVE IMPAIRMENTS: Abnormal gait, decreased activity tolerance, decreased balance, decreased coordination, decreased mobility, difficulty walking, decreased ROM, decreased strength, increased fascial restrictions, increased muscle spasms, impaired flexibility, impaired UE functional use, postural dysfunction, obesity, and pain.   ACTIVITY LIMITATIONS: carrying, lifting, bending, standing, squatting, sleeping, stairs, transfers, bed mobility, dressing, reach over head, and locomotion level  PARTICIPATION LIMITATIONS: meal prep, cleaning, laundry, interpersonal relationship, shopping, and community activity  PERSONAL FACTORS: Past/current experiences, Time since onset of injury/illness/exacerbation, and 3+ comorbidities: CVA, chronic pain, diabetes   are also affecting patient's functional outcome.   REHAB POTENTIAL: Good  CLINICAL DECISION MAKING: Evolving/moderate complexity  EVALUATION COMPLEXITY: Moderate   GOALS: Goals reviewed with patient? Yes  SHORT TERM GOALS: Target date: 04/15/2023    Pt will be I with cervical AROM and posture HEP Baseline unknown Goal status: INITIAL  2.  Pt will be able to improve AROM of L shoulder to WNL with  min increase in shoulder pain  Baseline: limited in functional reach  Goal status: INITIAL  3.  Patient will be screened for balance/fall risk and goal set Baseline: NT on eval  Goal status: INITIAL  LONG TERM GOALS:  Target date: 05/13/2023    Patient will be independent with home exercise program upon discharge Baseline: unknown  Goal status: INITIAL  2.  Patient will demo cervical active range of motion without increased pain  Baseline: limited in rotation, extension and side bending  Goal status: INITIAL  3.  Patient will report min increase in pain with ADLs and light housework Baseline: mod to severe  Goal status: INITIAL  4.  Patient will be able to walk up her stairs with min increase in pain in order to do laundry Baseline: avoid this due to hip pain  Goal status: INITIAL  5.  Patient will participate in short, daily walking program in order to improve overall health and mobility. Baseline: does not do this  Goal status: INITIAL  6.  Balance goal TBA  Baseline:  Goal status: INITIAL  PLAN:  PT FREQUENCY: 2x/week  PT DURATION: 8 weeks  PLANNED INTERVENTIONS: Therapeutic exercises, Therapeutic activity, Neuromuscular re-education, Balance training, Gait training, Patient/Family education, Self Care, Joint mobilization, DME instructions, Aquatic Therapy, Dry Needling, Electrical stimulation, Spinal mobilization, Cryotherapy, Moist heat, Taping, Ionotophoresis 4mg /ml Dexamethasone, Manual therapy, and Re-evaluation  PLAN FOR NEXT SESSION: check HEP, manual therapy, Cervical traction, modalities . Balance testing, TUG, 2 min walk   Hollister Wessler, PT 03/18/2023, 3:52 PM     [employer] Authorization   Choose one: Rehabilitative  Standardized Assessment or Functional Outcome Tool: See Pain Assessment and Other FOTO   Score or Percent Disability: 43% well (67% disabled)  Body Parts Treated (Select each separately):  Cervicothoracic. Overall deficits/functional  limitations for body part selected: moderate Shoulder. Overall deficits/functional limitations for body part selected: moderate Other back/hips  . Overall deficits/functional limitations for body part selected: moderate   If treatment provided at initial evaluation, no treatment charged due to lack of authorization.   Karie Mainland, PT 03/18/23 4:05 PM Phone: [phone] Fax: [phone]

## 2023-03-26 ENCOUNTER — Encounter: Payer: Self-pay | Admitting: Physical Therapy

## 2023-03-26 ENCOUNTER — Ambulatory Visit: Payer: Medicaid Other | Admitting: Physical Therapy

## 2023-03-26 DIAGNOSIS — M542 Cervicalgia: Secondary | ICD-10-CM | POA: Diagnosis not present

## 2023-03-26 DIAGNOSIS — G8929 Other chronic pain: Secondary | ICD-10-CM | POA: Diagnosis not present

## 2023-03-26 DIAGNOSIS — R262 Difficulty in walking, not elsewhere classified: Secondary | ICD-10-CM | POA: Diagnosis not present

## 2023-03-26 DIAGNOSIS — M25512 Pain in left shoulder: Secondary | ICD-10-CM | POA: Diagnosis not present

## 2023-03-26 DIAGNOSIS — M62830 Muscle spasm of back: Secondary | ICD-10-CM | POA: Diagnosis not present

## 2023-03-26 DIAGNOSIS — M5459 Other low back pain: Secondary | ICD-10-CM | POA: Diagnosis not present

## 2023-03-26 NOTE — Therapy (Signed)
OUTPATIENT PHYSICAL THERAPY SHOULDER/CERVICAL TREATMENT   Patient Name: Maria Hudson MRN: [id number] DOB:July 17, 1960, 62 y.o., female Today's Date: 03/26/2023  END OF SESSION:  PT End of Session - 03/26/23 0729     Visit Number 2    Number of Visits 17    Date for PT Re-Evaluation 05/13/23    Authorization Type [place] [facility]    PT Start Time 0722    PT Stop Time 0815    PT Time Calculation (min) 53 min             Past Medical History:  Diagnosis Date   Acute on chronic diastolic heart failure (HCC) 03/05/2021   Anemia    Arthritis    CAD (coronary artery disease)    CHF (congestive heart failure) (HCC)    CKD (chronic kidney disease) stage 4, GFR 15-29 ml/min (HCC) 03/05/2021   Colon polyps    CVA (cerebral vascular accident) (HCC)    Depression    Diabetes mellitus without complication (HCC)    type 2   Dyspnea    Hypertension    Hypertensive urgency 04/14/2021   Memory loss    mild   OSA (obstructive sleep apnea) 05/27/2021   uses CPAP 12-18 cm   Pneumonia    several times   Pure hypercholesterolemia 05/27/2021   Renal disorder    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   Stroke [facility])    Past Surgical History:  Procedure Laterality Date   ABDOMINAL HYSTERECTOMY     AV FISTULA PLACEMENT Left 12/09/2021   Procedure: LEFT ARTERIOVENOUS (AV) FISTULA CREATION;  Surgeon: Maeola Harman, MD;  Location: [facility] OR;  Service: Vascular;  Laterality: Left;   BIOPSY  09/08/2021   Procedure: BIOPSY;  Surgeon: Sherrilyn Rist, MD;  Location: [facility] ENDOSCOPY;  Service: Gastroenterology;;   CESAREAN SECTION     x 1   ESOPHAGOGASTRODUODENOSCOPY (EGD) WITH PROPOFOL N/A 09/08/2021   Procedure: ESOPHAGOGASTRODUODENOSCOPY (EGD) WITH PROPOFOL;  Surgeon: Sherrilyn Rist, MD;  Location: [facility] ENDOSCOPY;  Service: Gastroenterology;  Laterality: N/A;  chest pain, epigastric pain   INNER EAR SURGERY Bilateral    RIGHT HEART CATH N/A 05/14/2021   Procedure: RIGHT HEART CATH;   Surgeon: Laurey Morale, MD;  Location: [facility] INVASIVE CV LAB;  Service: Cardiovascular;  Laterality: N/A;   TONSILLECTOMY     age 56   Patient Active Problem List   Diagnosis Date Noted   History of colonic polyps 12/28/2022   Chronic constipation 03/31/2022   Pain due to onychomycosis of toenails of both feet 12/05/2021   Pure hypercholesterolemia 05/27/2021   Chronic diastolic CHF (congestive heart failure) (HCC) 04/29/2021   Hypertensive urgency 04/14/2021   Acute on chronic diastolic CHF (congestive heart failure) (HCC) 04/14/2021   Chest pain 03/05/2021   CAD (coronary artery disease) 03/05/2021   T2DM (type 2 diabetes mellitus) (HCC) 03/05/2021   CKD (chronic kidney disease) stage 4, GFR 15-29 ml/min (HCC) 03/05/2021   Class 3 obesity 03/05/2021   CVA (cerebral vascular accident) (HCC) 03/05/2021   Acute on chronic diastolic heart failure (HCC) 03/05/2021   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   AKI (acute kidney injury) (HCC) 03/05/2021   Vitreous hemorrhage of left eye (HCC) 11/14/2020   Orthopnea 08/06/2020   Vision loss of right eye 08/06/2020   Excessive daytime sleepiness 01/11/2020   Iron deficiency anemia 12/14/2019   Vitreous hemorrhage, right (HCC) 12/04/2019   Diabetic neuropathy (HCC) 07/26/2019   Proliferative diabetic retinopathy of both eyes with macular edema associated  with type 2 diabetes mellitus (HCC) 02/03/2019   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   Conductive hearing loss, bilateral 03/03/2018   Chronic bilateral low back pain without sciatica 08/21/2015   Chronic kidney disease (CKD) stage G3b/A3, moderately decreased glomerular filtration rate (GFR) between 30-44 mL/min/1.73 square meter and albuminuria creatinine ratio greater than 300 mg/g (HCC) 04/30/2015   Type 2 diabetes mellitus with diabetic polyneuropathy, with long-term current use of insulin (HCC) 02/15/2015   Advance directive on file 05/17/2014   Refusal of blood transfusions as patient is  [employer] 05/17/2014   OSA treated with BiPAP 02/23/2014   Coronary artery disease 07/07/2013   Severe obesity (BMI >= 40) (HCC) 05/18/2013   Neuropathy 04/11/2013   Diastolic heart failure (HCC) 10/16/2012   Essential hypertension 10/16/2012    PCP: Fleet Contras MD   REFERRING PROVIDER: Dr. Geraldine Solar, Allayne Gitelman, PA-C  REFERRING DIAG:  [phone] (ICD-10-CM) - Chronic left shoulder pain  M54.2 (ICD-10-CM) - Neck pain    THERAPY DIAG:  Cervicalgia  Chronic left shoulder pain  Rationale for Evaluation and Treatment: Rehabilitation  ONSET DATE: chronic   SUBJECTIVE:      03/26/23: My back and hips are doing good 4/10. My neck and shoulder is also 4/10. The exercises are helpful.                                                                                                                                                                                   SUBJECTIVE STATEMENT: Pt has had chronic L sided neck and shoulder pain, since 1996. She was working at [employer] and she slipped and fell a few times within a few days.  She has had pain since then.   She was not treated at that time. The doctor said it is likely coming from my neck. She had XR but no MRI.  Dr. Magnus Ivan injected her shoulder yesterday and no change as of today.   She reports difficulty walking, turning her head, using her L arm for home tasks and ADLs. "My whole L side is weaker"  Reports N/T L hand  Weaker in L UE  She also complains of bilateral hip and back pain, for which she is not being treated.  Does have issues with balance but not sure why.  She did have a stroke but reports that only affected her memory.  She is not sure if she has Neuropathy.      Hand dominance: Right  PERTINENT HISTORY: Diabetes, CVA, back pain     PAIN: 03/26/23 Are you having pain? Yes: NPRS scale: 4/10 Pain location: L neck and back, L shoulder  Pain description: tight, stiff  Aggravating  factors: sleeping on L side, activity  Relieving factors: Tylenol, hot bath Headache often  PRECAUTIONS: None  RED FLAGS: None   WEIGHT BEARING RESTRICTIONS: No  FALLS:  Has patient fallen in last 6 months? No   LIVING ENVIRONMENT: Lives with: lives with their family Lives in: House/apartment Stairs: Yes: Internal: 12 steps; on left going up Has following equipment at home: Single point cane and Walker - 2 wheeled Used walker about a month (4 wheeled)   OCCUPATION: Not working right now "Little bit if everything"  PLOF: Independent, Independent with household mobility without device, Independent with community mobility with device, Needs assistance with ADLs, Leisure: family time, TV, cards and games, and not exercising.  I used to love to exercise.  She used to do at home on her own.   Pt does not have a bed.  Sleeps on a couch.  Walking is painful hips, low back.    PATIENT GOALS: I want to get out of pain.    NEXT MD VISIT:   OBJECTIVE:  Note: Objective measures were completed at Evaluation unless otherwise noted.  DIAGNOSTIC FINDINGS:  Left shoulder 3 views: Shoulder is well located.  Subacromial space  well-maintained.  Mild AC joint arthritic changes.  Glenohumeral joints  well-maintained.  No acute fractures bony abnormalities.  Cervical spine 2 views: No acute fracture.  Loss of lordotic curvature.   Diminished disc space C6 -C7.  No spondylolisthesis.  No acute fractures.   No other bony abnormalities.  PATIENT SURVEYS:  FOTO 43% neck, 43% shoulder   COGNITION: Overall cognitive status: History of cognitive impairments - at baseline memory per pt.      SENSATION: WFL   POSTURE: Forward head, somewhat rounded upper back   UPPER EXTREMITY ROM:  Active ROM Right eval Left eval  Shoulder flexion [facility]  WFL pain   Shoulder extension    Shoulder abduction WFL  120 pain   Shoulder adduction    Shoulder internal rotation WFL Min pain reaches to low  back   Shoulder external rotation WFL  Pain , limited but NT  Elbow flexion    Elbow extension    Wrist flexion    Wrist extension    Wrist ulnar deviation    Wrist radial deviation    Wrist pronation    Wrist supination    (Blank rows = not tested)   CERVICAL AROM  EVAL    flexion 30 deg    extension 50 deg    L SB  25    R SB  40    L rotation 25    R rotation  WNL          UPPER EXTREMITY MMT:  MMT Right eval Left eval  Shoulder flexion 4 4-  Shoulder extension    Shoulder abduction 4 4-  Shoulder adduction    Shoulder internal rotation NT 4  Shoulder external rotation NT  4  Middle trapezius    Lower trapezius    Elbow flexion  5  Elbow extension  4  Wrist flexion    Wrist extension    Wrist ulnar deviation    Wrist radial deviation    Wrist pronation    Wrist supination    Grip strength (lbs)    (Blank rows = not tested)  FUNCTIONAL TESTS 03/26/23: 2 MWT 344 feet (LBP/Hip pain 4/10 increased to  7/10) 03/26/23: 14.1 sec  no AD, staggers with turn to sit , corrects with reaching strategy  03/26/23: BERG Balance: 52/56  SHOULDER SPECIAL TESTS: NT  JOINT MOBILITY TESTING:  Unable to tolerate in cervical spine No stiffness in L GH joint   PALPATION:  Pain and spasm L upper trap   TODAY'S TREATMENT:                                                                                                                                         [facility] Adult PT Treatment:                                                DATE: 03/26/23 Therapeutic Exercise: Nustep L4 LE x 6 minutes only due to increased neck pain in first minute Therapeutic Activity: BERG BALANCE TEST Sitting to Standing: 4.      Stands without using hands and stabilize independently Standing Unsupported: 4.      Stands safely for 2 minutes Sitting Unsupported: 4.     Sits for 2 minutes independently Standing to Sitting: 4.     Sits safely with minimal use of hands Transfers: 4.     Transfers  safely with minor use of hands Standing with eyes closed: 4.     Stands safely for 10 seconds  Standing with feet together: 4.     Stands for 1 minute safely Reaching forward with outstretched arm: 4.     Reaches forward 10 inches Retrieving object from the floor: 4.      Able to pick up easily and safely Turning to look behind: 4.     Looks behind from both sides and weight shifts well Turning 360 degrees: 4.     Able to turn in </=4 seconds  Place alternate foot on stool: 4.     Completes 8 steps in 20 seconds     Standing with one foot in front: 3.     Independent foot ahead for 30 seconds Standing on one foot: 1.     Holds <3 seconds Total Score: 52/56  2 MWT 344 feet without AD  TUG: 14.1 sec without AD, stagger with turn to sit, correct with reaching strategy Modalities: HMP to lumbar and cervical x 15 minutes end of session   DATE:  03/18/23 PT eval  HEP Manual therapy to cervical spine with light suboccipitals and distraction     PATIENT EDUCATION: Education details: PT, POC, HEP  Person educated: Patient Education method: Programmer, multimedia, Demonstration, Verbal cues, and Handouts Education comprehension: verbalized understanding, returned demonstration, and needs further education  HOME EXERCISE PROGRAM: Access Code: [email or web address] URL: [email or web address] Date: 03/18/2023 Prepared by: Karie Mainland  Exercises - Seated Scapular Retraction  - 2 x daily - 7 x weekly - 2 sets - 10 reps - 5 hold - Seated Chin Tuck with Neck Elongation  -  2 x daily - 7 x weekly - 2 sets - 10 reps - 5 hold - Seated Cervical Sidebending Stretch  - 2 x daily - 7 x weekly - 1 sets - 3-5 reps - 20-30 hold - Seated Cervical Rotation with Nod  - 1 x daily - 7 x weekly - 1 sets - 3-5 reps - 20-30 hold  ASSESSMENT:  CLINICAL IMPRESSION: Pt reports that it is a good day. Her lumbar, hip, neck and shoulder pain are all rated at 4/10. Focused on capturing baselines on functional tests  including BERG, TUG and 2 MWT. Primary PT to set goals as indicated. Ended session with HMP to address pain increases that occurred during session. Pt reported feeling improvement at end of session.    EVAL: Patient is a 62y.o. female who was seen today for physical therapy evaluation and treatment for cervical and left-sided shoulder pain.  Her symptoms are apparently coming from from her cervical spine.  She also has left-sided shoulder pain which could be related.  Her recent injection has not fully integrated at this time.  She also complains of significant back and hip pain, poor balance I recommend she mention this to her doctor as this significantly limits her mobility in her home.   OBJECTIVE IMPAIRMENTS: Abnormal gait, decreased activity tolerance, decreased balance, decreased coordination, decreased mobility, difficulty walking, decreased ROM, decreased strength, increased fascial restrictions, increased muscle spasms, impaired flexibility, impaired UE functional use, postural dysfunction, obesity, and pain.   ACTIVITY LIMITATIONS: carrying, lifting, bending, standing, squatting, sleeping, stairs, transfers, bed mobility, dressing, reach over head, and locomotion level  PARTICIPATION LIMITATIONS: meal prep, cleaning, laundry, interpersonal relationship, shopping, and community activity  PERSONAL FACTORS: Past/current experiences, Time since onset of injury/illness/exacerbation, and 3+ comorbidities: CVA, chronic pain, diabetes   are also affecting patient's functional outcome.   REHAB POTENTIAL: Good  CLINICAL DECISION MAKING: Evolving/moderate complexity  EVALUATION COMPLEXITY: Moderate   GOALS: Goals reviewed with patient? Yes  SHORT TERM GOALS: Target date: 04/15/2023    Pt will be I with cervical AROM and posture HEP Baseline unknown Goal status: INITIAL  2.  Pt will be able to improve AROM of L shoulder to WNL with min increase in shoulder pain  Baseline: limited in  functional reach  Goal status: INITIAL  3.  Patient will be screened for balance/fall risk and goal set Baseline: NT on eval  Goal status: INITIAL  LONG TERM GOALS: Target date: 05/13/2023    Patient will be independent with home exercise program upon discharge Baseline: unknown  Goal status: INITIAL  2.  Patient will demo cervical active range of motion without increased pain  Baseline: limited in rotation, extension and side bending  Goal status: INITIAL  3.  Patient will report min increase in pain with ADLs and light housework Baseline: mod to severe  Goal status: INITIAL  4.  Patient will be able to walk up her stairs with min increase in pain in order to do laundry Baseline: avoid this due to hip pain  Goal status: INITIAL  5.  Patient will participate in short, daily walking program in order to improve overall health and mobility. Baseline: does not do this  Goal status: INITIAL  6.  Balance goal TBA  Baseline:  Goal status: INITIAL  PLAN:  PT FREQUENCY: 2x/week  PT DURATION: 8 weeks  PLANNED INTERVENTIONS: Therapeutic exercises, Therapeutic activity, Neuromuscular re-education, Balance training, Gait training, Patient/Family education, Self Care, Joint mobilization, DME instructions, Aquatic Therapy, Dry Needling, Electrical  stimulation, Spinal mobilization, Cryotherapy, Moist heat, Taping, Ionotophoresis 4mg /ml Dexamethasone, Manual therapy, and Re-evaluation  PLAN FOR NEXT SESSION: check HEP, manual therapy, Cervical traction, modalities, TPDN . See functional test measures above and set goals as indicated.    Jannette Spanner, PTA 03/26/23 8:17 AM Phone: [phone] Fax: [phone]     [facility] Authorization   Choose one: Rehabilitative  Standardized Assessment or Functional Outcome Tool: See Pain Assessment and Other FOTO   Score or Percent Disability: 43% well (67% disabled)  Body Parts Treated (Select each separately):  Cervicothoracic.  Overall deficits/functional limitations for body part selected: moderate Shoulder. Overall deficits/functional limitations for body part selected: moderate Other back/hips  . Overall deficits/functional limitations for body part selected: moderate   If treatment provided at initial evaluation, no treatment charged due to lack of authorization.

## 2023-03-27 ENCOUNTER — Other Ambulatory Visit (HOSPITAL_COMMUNITY): Payer: Self-pay | Admitting: Family Medicine

## 2023-03-30 DIAGNOSIS — H9042 Sensorineural hearing loss, unilateral, left ear, with unrestricted hearing on the contralateral side: Secondary | ICD-10-CM | POA: Diagnosis not present

## 2023-03-30 DIAGNOSIS — H9313 Tinnitus, bilateral: Secondary | ICD-10-CM | POA: Diagnosis not present

## 2023-03-30 DIAGNOSIS — H6993 Unspecified Eustachian tube disorder, bilateral: Secondary | ICD-10-CM | POA: Diagnosis not present

## 2023-04-02 ENCOUNTER — Ambulatory Visit: Payer: Medicaid Other

## 2023-04-02 DIAGNOSIS — G8929 Other chronic pain: Secondary | ICD-10-CM

## 2023-04-02 DIAGNOSIS — M5459 Other low back pain: Secondary | ICD-10-CM | POA: Diagnosis not present

## 2023-04-02 DIAGNOSIS — R262 Difficulty in walking, not elsewhere classified: Secondary | ICD-10-CM | POA: Diagnosis not present

## 2023-04-02 DIAGNOSIS — M542 Cervicalgia: Secondary | ICD-10-CM | POA: Diagnosis not present

## 2023-04-02 DIAGNOSIS — R252 Cramp and spasm: Secondary | ICD-10-CM

## 2023-04-02 DIAGNOSIS — M62830 Muscle spasm of back: Secondary | ICD-10-CM | POA: Diagnosis not present

## 2023-04-02 DIAGNOSIS — M25512 Pain in left shoulder: Secondary | ICD-10-CM | POA: Diagnosis not present

## 2023-04-02 NOTE — Therapy (Signed)
OUTPATIENT PHYSICAL THERAPY SHOULDER/CERVICAL TREATMENT   Patient Name: Maria Hudson MRN: [id number] DOB:May 27, 1961, 62 y.o., female Today's Date: 04/02/2023  END OF SESSION:  PT End of Session - 04/02/23 0929     Visit Number 3    Number of Visits 17    Date for PT Re-Evaluation 05/13/23    Authorization Type [facility] [facility]    Authorization Time Period Approved 10 visits 03/22/23-05/21/23    Authorization - Visit Number 2    Authorization - Number of Visits 10    PT Start Time 0930    PT Stop Time 1015    PT Time Calculation (min) 45 min    Activity Tolerance Patient tolerated treatment well    Behavior During Therapy [facility] for tasks assessed/performed              Past Medical History:  Diagnosis Date   Acute on chronic diastolic heart failure (HCC) 03/05/2021   Anemia    Arthritis    CAD (coronary artery disease)    CHF (congestive heart failure) (HCC)    CKD (chronic kidney disease) stage 4, GFR 15-29 ml/min (HCC) 03/05/2021   Colon polyps    CVA (cerebral vascular accident) (HCC)    Depression    Diabetes mellitus without complication (HCC)    type 2   Dyspnea    Hypertension    Hypertensive urgency 04/14/2021   Memory loss    mild   OSA (obstructive sleep apnea) 05/27/2021   uses CPAP 12-18 cm   Pneumonia    several times   Pure hypercholesterolemia 05/27/2021   Renal disorder    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   Stroke [facility])    Past Surgical History:  Procedure Laterality Date   ABDOMINAL HYSTERECTOMY     AV FISTULA PLACEMENT Left 12/09/2021   Procedure: LEFT ARTERIOVENOUS (AV) FISTULA CREATION;  Surgeon: Maeola Harman, MD;  Location: [facility] OR;  Service: Vascular;  Laterality: Left;   BIOPSY  09/08/2021   Procedure: BIOPSY;  Surgeon: Sherrilyn Rist, MD;  Location: [facility] ENDOSCOPY;  Service: Gastroenterology;;   CESAREAN SECTION     x 1   ESOPHAGOGASTRODUODENOSCOPY (EGD) WITH PROPOFOL N/A 09/08/2021   Procedure:  ESOPHAGOGASTRODUODENOSCOPY (EGD) WITH PROPOFOL;  Surgeon: Sherrilyn Rist, MD;  Location: [facility] ENDOSCOPY;  Service: Gastroenterology;  Laterality: N/A;  chest pain, epigastric pain   INNER EAR SURGERY Bilateral    RIGHT HEART CATH N/A 05/14/2021   Procedure: RIGHT HEART CATH;  Surgeon: Laurey Morale, MD;  Location: [facility] INVASIVE CV LAB;  Service: Cardiovascular;  Laterality: N/A;   TONSILLECTOMY     age 63   Patient Active Problem List   Diagnosis Date Noted   History of colonic polyps 12/28/2022   Chronic constipation 03/31/2022   Pain due to onychomycosis of toenails of both feet 12/05/2021   Pure hypercholesterolemia 05/27/2021   Chronic diastolic CHF (congestive heart failure) (HCC) 04/29/2021   Hypertensive urgency 04/14/2021   Acute on chronic diastolic CHF (congestive heart failure) (HCC) 04/14/2021   Chest pain 03/05/2021   CAD (coronary artery disease) 03/05/2021   T2DM (type 2 diabetes mellitus) (HCC) 03/05/2021   CKD (chronic kidney disease) stage 4, GFR 15-29 ml/min (HCC) 03/05/2021   Class 3 obesity 03/05/2021   CVA (cerebral vascular accident) (HCC) 03/05/2021   Acute on chronic diastolic heart failure (HCC) 03/05/2021   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   AKI (acute kidney injury) (HCC) 03/05/2021   Vitreous hemorrhage of left eye (HCC) 11/14/2020  Orthopnea 08/06/2020   Vision loss of right eye 08/06/2020   Excessive daytime sleepiness 01/11/2020   Iron deficiency anemia 12/14/2019   Vitreous hemorrhage, right (HCC) 12/04/2019   Diabetic neuropathy (HCC) 07/26/2019   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) 02/03/2019   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   Conductive hearing loss, bilateral 03/03/2018   Chronic bilateral low back pain without sciatica 08/21/2015   Chronic kidney disease (CKD) stage G3b/A3, moderately decreased glomerular filtration rate (GFR) between 30-44 mL/min/1.73  square meter and albuminuria creatinine ratio greater than 300 mg/g (HCC) 04/30/2015   Type 2 diabetes mellitus with diabetic polyneuropathy, with long-term current use of insulin (HCC) 02/15/2015   Advance directive on file 05/17/2014   Refusal of blood transfusions as patient is Jehovah's Witness 05/17/2014   OSA treated with BiPAP 02/23/2014   Coronary artery disease 07/07/2013   Severe obesity (BMI >= 40) (HCC) 05/18/2013   Neuropathy 04/11/2013   Diastolic heart failure (HCC) 10/16/2012   Essential hypertension 10/16/2012    PCP: Fleet Contras MD   REFERRING PROVIDER: Dr. Geraldine Solar, Allayne Gitelman, PA-C  REFERRING DIAG:  [phone] (ICD-10-CM) - Chronic left shoulder pain  M54.2 (ICD-10-CM) - Neck pain    THERAPY DIAG:  Cervicalgia  Chronic left shoulder pain  Cramp and spasm  Rationale for Evaluation and Treatment: Rehabilitation  ONSET DATE: chronic   SUBJECTIVE:      04/02/23: My neck and shoulder is also 4/10 which is a lower level for me..                                                                                                                                                                             SUBJECTIVE STATEMENT: Pt has had chronic L sided neck and shoulder pain, since 1996. She was working at [employer] and she slipped and fell a few times within a few days.  She has had pain since then.   She was not treated at that time. The doctor said it is likely coming from my neck. She had XR but no MRI.  Dr. Magnus Ivan injected her shoulder yesterday and no change as of today.   She reports difficulty walking, turning her head, using her L arm for home tasks and ADLs. "My whole L side is weaker"  Reports N/T L hand  Weaker in L UE  She also complains of bilateral hip and back pain, for which she is not being treated.  Does have issues with balance but not sure why.  She did have a stroke but reports that only affected her memory.  She is not  sure if she has Neuropathy.  Hand dominance: Right  PERTINENT HISTORY: Diabetes, CVA, back pain     PAIN: 03/26/23 Are you having pain? Yes: NPRS scale: 4/10 Pain location: L neck and back, L shoulder  Pain description: tight, stiff Aggravating factors: sleeping on L side, activity  Relieving factors: Tylenol, hot bath Headache often  PRECAUTIONS: None  RED FLAGS: None   WEIGHT BEARING RESTRICTIONS: No  FALLS:  Has patient fallen in last 6 months? No   LIVING ENVIRONMENT: Lives with: lives with their family Lives in: House/apartment Stairs: Yes: Internal: 12 steps; on left going up Has following equipment at home: Single point cane and Walker - 2 wheeled Used walker about a month (4 wheeled)   OCCUPATION: Not working right now "Little bit if everything"  PLOF: Independent, Independent with household mobility without device, Independent with community mobility with device, Needs assistance with ADLs, Leisure: family time, TV, cards and games, and not exercising.  I used to love to exercise.  She used to do at home on her own.   Pt does not have a bed.  Sleeps on a couch.  Walking is painful hips, low back.    PATIENT GOALS: I want to get out of pain.    NEXT MD VISIT:   OBJECTIVE:  Note: Objective measures were completed at Evaluation unless otherwise noted.  DIAGNOSTIC FINDINGS:  Left shoulder 3 views: Shoulder is well located.  Subacromial space  well-maintained.  Mild AC joint arthritic changes.  Glenohumeral joints  well-maintained.  No acute fractures bony abnormalities.  Cervical spine 2 views: No acute fracture.  Loss of lordotic curvature.   Diminished disc space C6 -C7.  No spondylolisthesis.  No acute fractures.   No other bony abnormalities.  PATIENT SURVEYS:  FOTO 43% neck, 43% shoulder   COGNITION: Overall cognitive status: History of cognitive impairments - at baseline memory per pt.      SENSATION: WFL   POSTURE: Forward head,  somewhat rounded upper back   UPPER EXTREMITY ROM:  Active ROM Right eval Left eval  Shoulder flexion [facility]  WFL pain   Shoulder extension    Shoulder abduction WFL  120 pain   Shoulder adduction    Shoulder internal rotation WFL Min pain reaches to low back   Shoulder external rotation WFL  Pain , limited but NT  Elbow flexion    Elbow extension    Wrist flexion    Wrist extension    Wrist ulnar deviation    Wrist radial deviation    Wrist pronation    Wrist supination    (Blank rows = not tested)   CERVICAL AROM  EVAL    flexion 30 deg    extension 50 deg    L SB  25    R SB  40    L rotation 25    R rotation  WNL          UPPER EXTREMITY MMT:  MMT Right eval Left eval  Shoulder flexion 4 4-  Shoulder extension    Shoulder abduction 4 4-  Shoulder adduction    Shoulder internal rotation NT 4  Shoulder external rotation NT  4  Middle trapezius    Lower trapezius    Elbow flexion  5  Elbow extension  4  Wrist flexion    Wrist extension    Wrist ulnar deviation    Wrist radial deviation    Wrist pronation    Wrist supination    Grip strength (lbs)    (  Blank rows = not tested)  FUNCTIONAL TESTS 03/26/23: 2 MWT 344 feet (LBP/Hip pain 4/10 increased to  7/10) 03/26/23: 14.1 sec  no AD, staggers with turn to sit , corrects with reaching strategy  03/26/23: BERG Balance: 52/56  SHOULDER SPECIAL TESTS: NT  JOINT MOBILITY TESTING:  Unable to tolerate in cervical spine No stiffness in L GH joint   PALPATION:  Pain and spasm L upper trap   TODAY'S TREATMENT:   [facility] Adult PT Treatment:                                                DATE: 04/02/23 Therapeutic Exercise: Supine chin tuck x10 3" Supine DNF lift offs x10 5" Seated Scapular Retraction 10 reps - 5 hold Seated Chin Tuck with Neck Elongation 10 reps - 5 hold Seated R Cervical Sidebending Stretch 3 reps - 20 hold Seated R Cervical Rotation with Nod 3 reps - 20 hold Manual Therapy: Skilled  palpation to the L upper trap to identify TrPs and taut muscle bands  STM to the upper trap, levator, and cervical paraspinals Cervical tractions Trigger Point Dry Needling Treatment: Pre-treatment instruction: Patient instructed on dry needling rationale, procedures, and possible side effects including pain during treatment (achy,cramping feeling), bruising, drop of blood, lightheadedness, nausea, sweating. Patient Consent Given: Yes Education handout provided: Yes Muscles treated: L upper trap  Needle size and number: .30x48mm x 1 Electrical stimulation performed: No Parameters: N/A Treatment response/outcome: Twitch response elicited Post-treatment instructions: Patient instructed to expect possible mild to moderate muscle soreness later today and/or tomorrow. Patient instructed in methods to reduce muscle soreness and to continue prescribed HEP. If patient was dry needled over the lung field, patient was instructed on signs and symptoms of pneumothorax and, however unlikely, to see immediate medical attention should they occur. Patient was also educated on signs and symptoms of infection and to seek medical attention should they occur. Patient verbalized understanding of these instructions and education.                                                                                                                                         [facility] Adult PT Treatment:                                                DATE: 03/26/23 Therapeutic Exercise: Nustep L4 LE x 6 minutes only due to increased neck pain in first minute Therapeutic Activity: BERG BALANCE TEST Sitting to Standing: 4.      Stands without using hands and stabilize independently Standing Unsupported: 4.      Stands safely for 2  minutes Sitting Unsupported: 4.     Sits for 2 minutes independently Standing to Sitting: 4.     Sits safely with minimal use of hands Transfers: 4.     Transfers safely with minor use of hands Standing  with eyes closed: 4.     Stands safely for 10 seconds  Standing with feet together: 4.     Stands for 1 minute safely Reaching forward with outstretched arm: 4.     Reaches forward 10 inches Retrieving object from the floor: 4.      Able to pick up easily and safely Turning to look behind: 4.     Looks behind from both sides and weight shifts well Turning 360 degrees: 4.     Able to turn in </=4 seconds  Place alternate foot on stool: 4.     Completes 8 steps in 20 seconds     Standing with one foot in front: 3.     Independent foot ahead for 30 seconds Standing on one foot: 1.     Holds <3 seconds Total Score: 52/56  2 MWT 344 feet without AD  TUG: 14.1 sec without AD, stagger with turn to sit, correct with reaching strategy Modalities: HMP to lumbar and cervical x 15 minutes end of session   DATE:  03/18/23 PT eval  HEP Manual therapy to cervical spine with light suboccipitals and distraction     PATIENT EDUCATION: Education details: PT, POC, HEP  Person educated: Patient Education method: Programmer, multimedia, Demonstration, Verbal cues, and Handouts Education comprehension: verbalized understanding, returned demonstration, and needs further education  HOME EXERCISE PROGRAM: Access Code: [email or web address] URL: [email or web address] Date: 03/18/2023 Prepared by: Karie Mainland  Exercises - Seated Scapular Retraction  - 2 x daily - 7 x weekly - 2 sets - 10 reps - 5 hold - Seated Chin Tuck with Neck Elongation  - 2 x daily - 7 x weekly - 2 sets - 10 reps - 5 hold - Seated Cervical Sidebending Stretch  - 2 x daily - 7 x weekly - 1 sets - 3-5 reps - 20-30 hold - Seated Cervical Rotation with Nod  - 1 x daily - 7 x weekly - 1 sets - 3-5 reps - 20-30 hold  ASSESSMENT:  CLINICAL IMPRESSION: PT was completed for manual therapy for STM, TrP ID, and cervical traction. Pt reports relief of pain and pressure with manual traction. TPDN was then completed to the L upper trap with muscle  twitch responses. TPDN was f/b cervical ROM and postural/posterior chain strengthening for muscle activation. Pt's HEP ws updated. Pt tolerated PT today without adverse effects. Will assess pt's response to the TPDN her next PT session. Pt will continue to benefit from skilled PT to address impairments for improved function.    EVAL: Patient is a 62y.o. female who was seen today for physical therapy evaluation and treatment for cervical and left-sided shoulder pain.  Her symptoms are apparently coming from from her cervical spine.  She also has left-sided shoulder pain which could be related.  Her recent injection has not fully integrated at this time.  She also complains of significant back and hip pain, poor balance I recommend she mention this to her doctor as this significantly limits her mobility in her home.   OBJECTIVE IMPAIRMENTS: Abnormal gait, decreased activity tolerance, decreased balance, decreased coordination, decreased mobility, difficulty walking, decreased ROM, decreased strength, increased fascial restrictions, increased muscle spasms, impaired flexibility, impaired UE functional use, postural dysfunction, obesity, and pain.  ACTIVITY LIMITATIONS: carrying, lifting, bending, standing, squatting, sleeping, stairs, transfers, bed mobility, dressing, reach over head, and locomotion level  PARTICIPATION LIMITATIONS: meal prep, cleaning, laundry, interpersonal relationship, shopping, and community activity  PERSONAL FACTORS: Past/current experiences, Time since onset of injury/illness/exacerbation, and 3+ comorbidities: CVA, chronic pain, diabetes   are also affecting patient's functional outcome.   REHAB POTENTIAL: Good  CLINICAL DECISION MAKING: Evolving/moderate complexity  EVALUATION COMPLEXITY: Moderate   GOALS: Goals reviewed with patient? Yes  SHORT TERM GOALS: Target date: 04/15/2023    Pt will be I with cervical AROM and posture HEP Baseline unknown Goal status:  INITIAL  2.  Pt will be able to improve AROM of L shoulder to WNL with min increase in shoulder pain  Baseline: limited in functional reach  Goal status: INITIAL  3.  Patient will be screened for balance/fall risk and goal set Baseline: NT on eval  Goal status: INITIAL  LONG TERM GOALS: Target date: 05/13/2023    Patient will be independent with home exercise program upon discharge Baseline: unknown  Goal status: INITIAL  2.  Patient will demo cervical active range of motion without increased pain  Baseline: limited in rotation, extension and side bending  Goal status: INITIAL  3.  Patient will report min increase in pain with ADLs and light housework Baseline: mod to severe  Goal status: INITIAL  4.  Patient will be able to walk up her stairs with min increase in pain in order to do laundry Baseline: avoid this due to hip pain  Goal status: INITIAL  5.  Patient will participate in short, daily walking program in order to improve overall health and mobility. Baseline: does not do this  Goal status: INITIAL  6.  Balance goal TBA  Baseline:  Goal status: INITIAL  PLAN:  PT FREQUENCY: 2x/week  PT DURATION: 8 weeks  PLANNED INTERVENTIONS: Therapeutic exercises, Therapeutic activity, Neuromuscular re-education, Balance training, Gait training, Patient/Family education, Self Care, Joint mobilization, DME instructions, Aquatic Therapy, Dry Needling, Electrical stimulation, Spinal mobilization, Cryotherapy, Moist heat, Taping, Ionotophoresis 4mg /ml Dexamethasone, Manual therapy, and Re-evaluation  PLAN FOR NEXT SESSION: check HEP, manual therapy, Cervical traction, modalities, TPDN . See functional test measures above and set goals as indicated.   Ellasyn Swilling MS, PT 04/02/23 3:34 PM     [employer] Authorization   Choose one: Rehabilitative  Standardized Assessment or Functional Outcome Tool: See Pain Assessment and Other FOTO   Score or Percent Disability: 43% well  (67% disabled)  Body Parts Treated (Select each separately):  Cervicothoracic. Overall deficits/functional limitations for body part selected: moderate Shoulder. Overall deficits/functional limitations for body part selected: moderate Other back/hips  . Overall deficits/functional limitations for body part selected: moderate   If treatment provided at initial evaluation, no treatment charged due to lack of authorization.

## 2023-04-02 NOTE — Patient Instructions (Signed)

## 2023-04-05 ENCOUNTER — Ambulatory Visit: Payer: Medicaid Other | Admitting: Physical Therapy

## 2023-04-05 ENCOUNTER — Encounter: Payer: Self-pay | Admitting: Physical Therapy

## 2023-04-05 DIAGNOSIS — R252 Cramp and spasm: Secondary | ICD-10-CM

## 2023-04-05 DIAGNOSIS — R262 Difficulty in walking, not elsewhere classified: Secondary | ICD-10-CM

## 2023-04-05 DIAGNOSIS — G8929 Other chronic pain: Secondary | ICD-10-CM | POA: Diagnosis not present

## 2023-04-05 DIAGNOSIS — M5459 Other low back pain: Secondary | ICD-10-CM

## 2023-04-05 DIAGNOSIS — M62830 Muscle spasm of back: Secondary | ICD-10-CM

## 2023-04-05 DIAGNOSIS — M542 Cervicalgia: Secondary | ICD-10-CM | POA: Diagnosis not present

## 2023-04-05 DIAGNOSIS — M25512 Pain in left shoulder: Secondary | ICD-10-CM | POA: Diagnosis not present

## 2023-04-05 NOTE — Therapy (Signed)
OUTPATIENT PHYSICAL THERAPY SHOULDER/CERVICAL TREATMENT   Patient Name: Maria Hudson MRN: [id number] DOB:[birth year], 62 y.o., female Today's Date: 04/05/2023  END OF SESSION:  PT End of Session - 04/05/23 0935     Visit Number 4    Number of Visits 17    Date for PT Re-Evaluation 05/13/23    Authorization Type [employer] [facility]    Authorization - Visit Number 3    Authorization - Number of Visits 10    PT Start Time 0933    PT Stop Time 1015    PT Time Calculation (min) 42 min    Activity Tolerance Patient tolerated treatment well    Behavior During Therapy [facility] for tasks assessed/performed               Past Medical History:  Diagnosis Date   Acute on chronic diastolic heart failure (HCC) 03/05/2021   Anemia    Arthritis    CAD (coronary artery disease)    CHF (congestive heart failure) (HCC)    CKD (chronic kidney disease) stage 4, GFR 15-29 ml/min (HCC) 03/05/2021   Colon polyps    CVA (cerebral vascular accident) (HCC)    Depression    Diabetes mellitus without complication (HCC)    type 2   Dyspnea    Hypertension    Hypertensive urgency 04/14/2021   Memory loss    mild   OSA (obstructive sleep apnea) 05/27/2021   uses CPAP 12-18 cm   Pneumonia    several times   Pure hypercholesterolemia 05/27/2021   Renal disorder    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   Stroke [facility])    Past Surgical History:  Procedure Laterality Date   ABDOMINAL HYSTERECTOMY     AV FISTULA PLACEMENT Left 12/09/2021   Procedure: LEFT ARTERIOVENOUS (AV) FISTULA CREATION;  Surgeon: Maeola Harman, MD;  Location: [facility] OR;  Service: Vascular;  Laterality: Left;   BIOPSY  09/08/2021   Procedure: BIOPSY;  Surgeon: Sherrilyn Rist, MD;  Location: [facility] ENDOSCOPY;  Service: Gastroenterology;;   CESAREAN SECTION     x 1   ESOPHAGOGASTRODUODENOSCOPY (EGD) WITH PROPOFOL N/A 09/08/2021   Procedure: ESOPHAGOGASTRODUODENOSCOPY (EGD) WITH PROPOFOL;  Surgeon: Sherrilyn Rist, MD;   Location: [facility] ENDOSCOPY;  Service: Gastroenterology;  Laterality: N/A;  chest pain, epigastric pain   INNER EAR SURGERY Bilateral    RIGHT HEART CATH N/A 05/14/2021   Procedure: RIGHT HEART CATH;  Surgeon: Laurey Morale, MD;  Location: [facility] INVASIVE CV LAB;  Service: Cardiovascular;  Laterality: N/A;   TONSILLECTOMY     age 59   Patient Active Problem List   Diagnosis Date Noted   History of colonic polyps 12/28/2022   Chronic constipation 03/31/2022   Pain due to onychomycosis of toenails of both feet 12/05/2021   Pure hypercholesterolemia 05/27/2021   Chronic diastolic CHF (congestive heart failure) (HCC) 04/29/2021   Hypertensive urgency 04/14/2021   Acute on chronic diastolic CHF (congestive heart failure) (HCC) 04/14/2021   Chest pain 03/05/2021   CAD (coronary artery disease) 03/05/2021   T2DM (type 2 diabetes mellitus) (HCC) 03/05/2021   CKD (chronic kidney disease) stage 4, GFR 15-29 ml/min (HCC) 03/05/2021   Class 3 obesity 03/05/2021   CVA (cerebral vascular accident) (HCC) 03/05/2021   Acute on chronic diastolic heart failure (HCC) 03/05/2021   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   AKI (acute kidney injury) (HCC) 03/05/2021   Vitreous hemorrhage of left eye (HCC) 11/14/2020   Orthopnea 08/06/2020   Vision loss of right eye  08/06/2020   Excessive daytime sleepiness 01/11/2020   Iron deficiency anemia 12/14/2019   Vitreous hemorrhage, right (HCC) 12/04/2019   Diabetic neuropathy (HCC) 07/26/2019   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) 02/03/2019   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   Conductive hearing loss, bilateral 03/03/2018   Chronic bilateral low back pain without sciatica 08/21/2015   Chronic kidney disease (CKD) stage G3b/A3, moderately decreased glomerular filtration rate (GFR) between 30-44 mL/min/1.73 square meter and albuminuria creatinine ratio greater than 300 mg/g (HCC) 04/30/2015    Type 2 diabetes mellitus with diabetic polyneuropathy, with long-term current use of insulin (HCC) 02/15/2015   Advance directive on file 05/17/2014   Refusal of blood transfusions as patient is Jehovah's Witness 05/17/2014   OSA treated with BiPAP 02/23/2014   Coronary artery disease 07/07/2013   Severe obesity (BMI >= 40) (HCC) 05/18/2013   Neuropathy 04/11/2013   Diastolic heart failure (HCC) 10/16/2012   Essential hypertension 10/16/2012    PCP: Fleet Contras MD   REFERRING PROVIDER: Dr. Geraldine Solar, Allayne Gitelman, PA-C  REFERRING DIAG:  [phone] (ICD-10-CM) - Chronic left shoulder pain  M54.2 (ICD-10-CM) - Neck pain    THERAPY DIAG:  Cervicalgia  Chronic left shoulder pain  Cramp and spasm  Difficulty in walking, not elsewhere classified  Other low back pain  Muscle spasm of back  Rationale for Evaluation and Treatment: Rehabilitation  ONSET DATE: chronic   SUBJECTIVE:      Shoulder is better , neck is not really better.                                                       SUBJECTIVE STATEMENT: Pt has had chronic L sided neck and shoulder pain, since 1996. She was working at [employer] and she slipped and fell a few times within a few days.  She has had pain since then.   She was not treated at that time. The doctor said it is likely coming from my neck. She had XR but no MRI.  Dr. Magnus Ivan injected her shoulder yesterday and no change as of today.   She reports difficulty walking, turning her head, using her L arm for home tasks and ADLs. "My whole L side is weaker"  Reports N/T L hand  Weaker in L UE  She also complains of bilateral hip and back pain, for which she is not being treated.  Does have issues with balance but not sure why.  She did have a stroke but reports that only affected her memory.  She is not sure if she has Neuropathy.      Hand dominance: Right  PERTINENT HISTORY: Diabetes, CVA, back pain     PAIN: 03/26/23 Are you  having pain? Yes: NPRS scale: 6/10 Pain location: L neck  Pain description: tight, stiff Aggravating factors: sleeping on L side, activity  Relieving factors: Tylenol, hot bath Headache often   PRECAUTIONS: None  RED FLAGS: None   WEIGHT BEARING RESTRICTIONS: No  FALLS:  Has patient fallen in last 6 months? No   LIVING ENVIRONMENT: Lives with: lives with their family Lives in: House/apartment Stairs: Yes: Internal: 12 steps; on left going up Has following equipment at home: Single point cane and Walker - 2 wheeled Used walker about a  month (4 wheeled)   OCCUPATION: Not working right now "Little bit if everything"  PLOF: Independent, Independent with household mobility without device, Independent with community mobility with device, Needs assistance with ADLs, Leisure: family time, TV, cards and games, and not exercising.  I used to love to exercise.  She used to do at home on her own.   Pt does not have a bed.  Sleeps on a couch.  Walking is painful hips, low back.    PATIENT GOALS: I want to get out of pain.    NEXT MD VISIT:   OBJECTIVE:  Note: Objective measures were completed at Evaluation unless otherwise noted.  DIAGNOSTIC FINDINGS:  Left shoulder 3 views: Shoulder is well located.  Subacromial space  well-maintained.  Mild AC joint arthritic changes.  Glenohumeral joints  well-maintained.  No acute fractures bony abnormalities.  Cervical spine 2 views: No acute fracture.  Loss of lordotic curvature.   Diminished disc space C6 -C7.  No spondylolisthesis.  No acute fractures.   No other bony abnormalities.  PATIENT SURVEYS:  FOTO 43% neck, 43% shoulder   COGNITION: Overall cognitive status: History of cognitive impairments - at baseline memory per pt.      SENSATION: WFL   POSTURE: Forward head, somewhat rounded upper back   UPPER EXTREMITY ROM:  Active ROM Right eval Left eval  Shoulder flexion [facility]  WFL pain   Shoulder extension    Shoulder  abduction WFL  120 pain   Shoulder adduction    Shoulder internal rotation WFL Min pain reaches to low back   Shoulder external rotation WFL  Pain , limited but NT  Elbow flexion    Elbow extension    Wrist flexion    Wrist extension    Wrist ulnar deviation    Wrist radial deviation    Wrist pronation    Wrist supination    (Blank rows = not tested)   CERVICAL AROM  EVAL    flexion 30 deg    extension 50 deg    L SB  25    R SB  40    L rotation 25    R rotation  WNL          UPPER EXTREMITY MMT:  MMT Right eval Left eval  Shoulder flexion 4 4-  Shoulder extension    Shoulder abduction 4 4-  Shoulder adduction    Shoulder internal rotation NT 4  Shoulder external rotation NT  4  Middle trapezius    Lower trapezius    Elbow flexion  5  Elbow extension  4  Wrist flexion    Wrist extension    Wrist ulnar deviation    Wrist radial deviation    Wrist pronation    Wrist supination    Grip strength (lbs)    (Blank rows = not tested)  FUNCTIONAL TESTS 03/26/23: 2 MWT 344 feet (LBP/Hip pain 4/10 increased to  7/10) 03/26/23: 14.1 sec  no AD, staggers with turn to sit , corrects with reaching strategy  03/26/23: BERG Balance: 52/56  SHOULDER SPECIAL TESTS: NT  JOINT MOBILITY TESTING:  Unable to tolerate in cervical spine No stiffness in L GH joint   PALPATION:  Pain and spasm L upper trap   TODAY'S TREATMENT:    [facility] Adult PT Treatment:  DATE: 04/05/23 Therapeutic Exercise: Cervical stretches, review and perform x 3 each side: upper trap, rotation Supine small ROM rotation with towel under neck  Supine chin tuck x 5 sec x 10  Scapular retraction x 10   Chin tuck with slight rotation x 5 each side  Supine chin tuck with shoulder flexion with dowel  Supine horizontal abduction and narrow grip flexion yellow 2 x 10  ER Yellow band 2 x 15 , min pain L shoulder Standing against wall for shoulder flexion   Standing open book  x 5 offered supine as well  Manual Therapy: NA  Self Care: HEP techniques and streamlining for routine    [facility] Adult PT Treatment:                                                DATE: 04/02/23 Therapeutic Exercise: Supine chin tuck x10 3" Supine DNF lift offs x10 5" Seated Scapular Retraction 10 reps - 5 hold Seated Chin Tuck with Neck Elongation 10 reps - 5 hold Seated R Cervical Sidebending Stretch 3 reps - 20 hold Seated R Cervical Rotation with Nod 3 reps - 20 hold Manual Therapy: Skilled palpation to the L upper trap to identify TrPs and taut muscle bands  STM to the upper trap, levator, and cervical paraspinals Cervical tractions Trigger Point Dry Needling Treatment: Pre-treatment instruction: Patient instructed on dry needling rationale, procedures, and possible side effects including pain during treatment (achy,cramping feeling), bruising, drop of blood, lightheadedness, nausea, sweating. Patient Consent Given: Yes Education handout provided: Yes Muscles treated: L upper trap  Needle size and number: .30x91mm x 1 Electrical stimulation performed: No Parameters: N/A Treatment response/outcome: Twitch response elicited Post-treatment instructions: Patient instructed to expect possible mild to moderate muscle soreness later today and/or tomorrow. Patient instructed in methods to reduce muscle soreness and to continue prescribed HEP. If patient was dry needled over the lung field, patient was instructed on signs and symptoms of pneumothorax and, however unlikely, to see immediate medical attention should they occur. Patient was also educated on signs and symptoms of infection and to seek medical attention should they occur. Patient verbalized understanding of these instructions and education.                                                                                                                                         [facility] Adult PT Treatment:                                                 DATE: 03/26/23 Therapeutic Exercise: Nustep L4 LE x 6 minutes only due to increased neck  pain in first minute Therapeutic Activity: BERG BALANCE TEST Sitting to Standing: 4.      Stands without using hands and stabilize independently Standing Unsupported: 4.      Stands safely for 2 minutes Sitting Unsupported: 4.     Sits for 2 minutes independently Standing to Sitting: 4.     Sits safely with minimal use of hands Transfers: 4.     Transfers safely with minor use of hands Standing with eyes closed: 4.     Stands safely for 10 seconds  Standing with feet together: 4.     Stands for 1 minute safely Reaching forward with outstretched arm: 4.     Reaches forward 10 inches Retrieving object from the floor: 4.      Able to pick up easily and safely Turning to look behind: 4.     Looks behind from both sides and weight shifts well Turning 360 degrees: 4.     Able to turn in </=4 seconds  Place alternate foot on stool: 4.     Completes 8 steps in 20 seconds     Standing with one foot in front: 3.     Independent foot ahead for 30 seconds Standing on one foot: 1.     Holds <3 seconds Total Score: 52/56  2 MWT 344 feet without AD  TUG: 14.1 sec without AD, stagger with turn to sit, correct with reaching strategy Modalities: HMP to lumbar and cervical x 15 minutes end of session   DATE:  03/18/23 PT eval  HEP Manual therapy to cervical spine with light suboccipitals and distraction     PATIENT EDUCATION: Education details: PT, POC, HEP  Person educated: Patient Education method: Programmer, multimedia, Demonstration, Verbal cues, and Handouts Education comprehension: verbalized understanding, returned demonstration, and needs further education  HOME EXERCISE PROGRAM: Access Code: [email or web address] URL: [email or web address] Date: 04/05/2023 Prepared by: Karie Mainland  Exercises - Seated Scapular Retraction  - 2 x daily - 7 x weekly - 2 sets - 10 reps -  5 hold - Seated Chin Tuck with Neck Elongation  - 2 x daily - 7 x weekly - 2 sets - 10 reps - 5 hold - Seated Cervical Sidebending Stretch  - 2 x daily - 7 x weekly - 1 sets - 3-5 reps - 20-30 hold - Seated Cervical Rotation with Nod  - 1 x daily - 7 x weekly - 1 sets - 3-5 reps - 20-30 hold - Supine Cervical Retraction with Towel  - 2 x daily - 7 x weekly - 1 sets - 10 reps - 3 hold - Supine DNF Liftoffs  - 2 x daily - 7 x weekly - 1 sets - 10 reps - 5 hold - Standing Thoracic Open Book at Wall  - 1 x daily - 7 x weekly - 2 sets - 10 reps - 5-10 hold - Standing Shoulder Flexion AAROM with Dowel  - 1 x daily - 7 x weekly - 2 sets - 10 reps - 5 hold - Shoulder External Rotation and Scapular Retraction with Resistance  - 1 x daily - 7 x weekly - 2 sets - 10 reps - 5 hold - Standing Shoulder Horizontal Abduction with Resistance  - 1 x daily - 7 x weekly - 2 sets - 10 reps - 5 hold   ASSESSMENT:  CLINICAL IMPRESSION: Patient reports shoulder pain improved overall but cont to have neck pain with Lt sidebending, Lt rotation.  This was improved when in supine.  She Was given cervical stability exercises which she performed well in both supine and standing.  Modified session throughout for pain and comfort.   OBJECTIVE IMPAIRMENTS: Abnormal gait, decreased activity tolerance, decreased balance, decreased coordination, decreased mobility, difficulty walking, decreased ROM, decreased strength, increased fascial restrictions, increased muscle spasms, impaired flexibility, impaired UE functional use, postural dysfunction, obesity, and pain.   ACTIVITY LIMITATIONS: carrying, lifting, bending, standing, squatting, sleeping, stairs, transfers, bed mobility, dressing, reach over head, and locomotion level  PARTICIPATION LIMITATIONS: meal prep, cleaning, laundry, interpersonal relationship, shopping, and community activity  PERSONAL FACTORS: Past/current experiences, Time since onset of  injury/illness/exacerbation, and 3+ comorbidities: CVA, chronic pain, diabetes   are also affecting patient's functional outcome.   REHAB POTENTIAL: Good  CLINICAL DECISION MAKING: Evolving/moderate complexity  EVALUATION COMPLEXITY: Moderate   GOALS: Goals reviewed with patient? Yes  SHORT TERM GOALS: Target date: 04/15/2023    Pt will be I with cervical AROM and posture HEP Baseline unknown Goal status: ongoing   2.  Pt will be able to improve AROM of L shoulder to WNL with min increase in shoulder pain  Baseline: limited in functional reach  Goal status: ongoing   3.  Patient will be screened for balance/fall risk and goal set Baseline: NT on eval  Goal status: MET   LONG TERM GOALS: Target date: 05/13/2023    Patient will be independent with home exercise program upon discharge Baseline: unknown  Goal status: INITIAL  2.  Patient will demo cervical active range of motion without increased pain  Baseline: limited in rotation, extension and side bending  Goal status: INITIAL  3.  Patient will report min increase in pain with ADLs and light housework Baseline: mod to severe  Goal status: INITIAL  4.  Patient will be able to walk up her stairs with min increase in pain in order to do laundry Baseline: avoid this due to hip pain  Goal status: INITIAL  5.  Patient will participate in short, daily walking program in order to improve overall health and mobility. Baseline: does not do this  Goal status: INITIAL  6.  Patient will score < 12 sec on TUG without LOB indicating decreased fall risk.  Baseline: 14 sec Goal status: NEW   PLAN:  PT FREQUENCY: 2x/week  PT DURATION: 8 weeks  PLANNED INTERVENTIONS: Therapeutic exercises, Therapeutic activity, Neuromuscular re-education, Balance training, Gait training, Patient/Family education, Self Care, Joint mobilization, DME instructions, Aquatic Therapy, Dry Needling, Electrical stimulation, Spinal mobilization,  Cryotherapy, Moist heat, Taping, Ionotophoresis 4mg /ml Dexamethasone, Manual therapy, and Re-evaluation  PLAN FOR NEXT SESSION: check HEP, manual therapy, Cervical traction, modalities, TPDN  Karie Mainland, PT 04/05/23 10:29 AM Phone: [phone] Fax: [phone]

## 2023-04-06 ENCOUNTER — Other Ambulatory Visit: Payer: Self-pay | Admitting: Obstetrics and Gynecology

## 2023-04-06 NOTE — Patient Instructions (Signed)
Visit Information  Maria Hudson was given information about Medicaid Managed Care team care coordination services as a part of their [facility] Medicaid benefit. Maria Hudson verbally consented to engagement with the [facility] Managed Care team.   If you are experiencing a medical emergency, please call 911 or report to your local emergency department or urgent care.   If you have a non-emergency medical problem during routine business hours, please contact your provider's office and ask to speak with a nurse.   For questions related to your [facility] health plan, please call: [phone] or go here:[email or web address]  If you would like to schedule transportation through your [facility] plan, please call the following number at least 2 days in advance of your appointment: [phone].   You can also use the MTM portal or MTM mobile app to manage your rides. Reimbursement for transportation is available through [facility]! For the portal, please go to [email or web address]  Call the [facility] Crisis Line at [phone], at any time, 24 hours a day, 7 days a week. If you are in danger or need immediate medical attention call 911.  If you would like help to quit smoking, call 1-800-QUIT-NOW ((780) 448-1183) OR Espaol: 1-855-Djelo-Ya (1-517-616-0737) o para ms informacin haga clic aqu or Text READY to 106-269 to register via text  Maria Hudson - following are the goals we discussed in your visit today:   Goals Addressed    Timeframe:  Long-Range Goal Priority:  High Start Date:     11/12/21                        Expected End Date:  ongoing                     Follow Up Date: 05/07/23   - schedule appointment for vaccines needed due to my age or health - schedule recommended health tests (blood work, mammogram, colonoscopy, pap test) - schedule and keep appointment for annual check-up    Why is this important?   Screening tests can find diseases early when they  are easier to treat.  Your doctor or nurse will talk with you about which tests are important for you.  Getting shots for common diseases like the flu and shingles will help prevent them.  04/06/23:  PT continues, to [facility] appt.  GO 10/23, recent ENT appt  Patient verbalizes understanding of instructions and care plan provided today and agrees to view in MyChart. Active MyChart status and patient understanding of how to access instructions and care plan via MyChart confirmed with patient.     The Managed Medicaid care management team will reach out to the patient again over the next 30 business  days.  The  Patient  has been provided with contact information for the Managed Medicaid care management team and has been advised to call with any health related questions or concerns.   Kathi Der RN, BSN [place]  [facility] Care Management Coordinator - Managed Medicaid High Risk [phone]   Following is a copy of your plan of care:  Care Plan : RN Care Manager Plan Of Care  Updates made by [name], RN since 04/06/2023 12:00 AM     Problem: Knowledge Deficit and Care Coordination Needs Related to Management of HTN, HF, Type 2 DM   Priority: High     Long-Range Goal: Development of Plan of Care to Address Knowledge Deficits and Care Coordination Needs  related to  management of CHF, CAD, HTN, HLD, DMII, CKD Stage 4, and Depression   Start Date: 05/06/2021  Expected End Date: 07/07/2023  Priority: High  Note:   Current Barriers:  Knowledge Deficits related to plan of care for management of CHF, CAD, HTN, HLD, DMII, CKD Stage 4, and Depression 04/06/23:  PT continues, to [facility] appt. BP and BG stable.  Blood sugar 125-210.  Hasn't  RNCM Clinical Goal(s):  Patient will demonstrate ongoing adherence to prescribed treatment plan for CHF, CAD, HTN, HLD, DMII, CKD Stage 4, and Depression as evidenced by patient report of  improved health and quality of life and no unplanned ED or unplanned hospital admissions continue to work with RN Care Manager and/or Social Worker to address care management and care coordination needs related to CHF, CAD, HTN, HLD, DMII, CKD Stage 4, and Depression as evidenced by adherence to CM Team Scheduled appointments      Work with [facility] LCSW and psychiatrist to treat depression  Interventions: Inter-disciplinary care team collaboration (see longitudinal plan of care) Evaluation of current treatment plan related to  self management and patient's adherence to plan as established by provider Mailed summary of benefits for her [facility] Managed Medicaid plan to her home address Collaborated with Care Guide for dental resources-completed. Care Guide referral for dental resources Collaborated with BSW for assistance with [facility] appt.-completed. BSW referral for appt assistance. Collaborated with Pharmacy regarding  uncontrolled HTN Pharmacy referral for uncontrolled HTN-completed. Collaborated with LCSW for depression LCSW referral for depression-completed BSW referral for dental and PCP resources-completed. Collaborated with BSW  CAD Interventions: (Status:  New goal.) Long Term Goal Assessed understanding of CAD diagnosis Medications reviewed including medications utilized in CAD treatment plan Provided education on importance of blood pressure control in management of CAD Counseled on importance of regular laboratory monitoring as prescribed Reviewed Importance of taking all medications as prescribed Reviewed Importance of attending all scheduled provider appointments Advised to report any changes in symptoms or exercise tolerance Assessed social determinant of health barriers  Hyperlipidemia Interventions:  (Status:  New goal.) Long Term Goal Medication review performed; medication list updated in electronic medical record.  Provider established cholesterol goals  reviewed Counseled on importance of regular laboratory monitoring as prescribed Reviewed importance of limiting foods high in cholesterol Assessed social determinant of health barriers     Heart Failure Interventions:  (Status: Goal on Track (progressing): YES.)  Long Term Goal - patient states her fluid retention has significantly improved,  says she cannot tolerate the compression stockings because they are too tight and make her legs hurt Assessed patient's heart failure management strategies  Diabetes:  (Status: Goal on Track (progressing): YES.) Long Term Goal   Lab Results  Component Value Date   HGBA1C 6.7 (H) 04/29/2021   Hgn A1C=6.3 on 12/26/21 Hgb A1C=5.2 in October per patient HGBA1C=6.2 on 02/22/23  Assessed patient's understanding of A1C goal: <7% Reviewed medications with patient and discussed importance of medication adherence;        Counseled on importance of regular laboratory monitoring as prescribed;        Discussed plans with patient for ongoing care management follow up and provided patient with direct contact information for care management team;      Advised patient, providing education and rationale, to check cbg three times daily and record        call provider for findings outside established parameters;       Review of patient status,  including review of consultants reports, relevant laboratory and other test results, and medications completed;       Discussed addition of Ozempic to medication regimen , reviewed mechanism of action and proven cardiovascular benefits To schedule ENDO appt-discussed nutrition referral, [facility]-ENDO appt scheduled-patient declines referral.  Hypertension and CKD stage 4 : (Status: Goal on Track (progressing): YES.) Long Term Goal-  has been placed on kidney transplant waiting list, fistu;a placed 12/09/21,ready for use 03/11/22.  BP Readings from Last 3 Encounters:        02/02/23 164/70  04/06/23           144/75 01/08/23         126-178/66-85    Most recent eGFR/CrCl:    No components found for: CRCL Lab Results  Component Value Date   NA 136 08/19/2021   K 3.9 08/19/2021   CREATININE 2.51 (H) 08/19/2021   EGFR 19 (L) 03/13/2021   GFRNONAA 22 (L) 08/19/2021   GLUCOSE 122 (H) 08/19/2021    Evaluation of current treatment plan related to hypertension self management and patient's adherence to plan as established by provider;   Reviewed medications with patient and discussed importance of compliance;  Discussed plans with patient for ongoing care management follow up and provided patient with direct contact information for care management team; Advised patient, providing education and rationale, to monitor blood pressure daily and record, calling PCP for findings outside established parameters;  Reviewed scheduled/upcoming provider appointments   Weight Loss Interventions:  (Status:  New goal.) Long Term Goal--patient has started Ozempic Discussed indications, mechanism of action, common side effects and CV benefits  of GLP1 Ozempic Assessed tolerance of Ozempic  Interdisciplinary Collaboration:  (Status: Goal on Track (progressing): YES.) Long Term Goal  Collaborated with BSW to initiate plan of care to address needs related to Housing barriers, Inability to perform ADL's independently, and Inability to perform IADL's independently in patient with CHF, CAD, HTN, HLD, DM, CKD Stage 4, and Depression Assessed status of patient's involvement with behavioral health counselor Collaboration with pharmacist for medication review, discussion of ? Medication side effects causing chronic constipation and arranging for patient to received home delivery of medications in blister packs  Patient Goals/Self-Care Activities: Take medications as prescribed   Attend all scheduled provider appointments Call pharmacy for medication refills 3-7 days in advance of running out of medications Attend  church or other social activities Perform all self care activities independently  Call provider office for new concerns or questions  Work with the social worker to address care coordination needs and will continue to work with the clinical team to address health care and disease management related needs call office if I gain more than 2 pounds in one day or 5 pounds in one week keep legs up while sitting track weight in diary use salt in moderation watch for swelling in feet, ankles and legs every day weigh myself daily know when to call the doctorfor fluid weight gain or consistently abnormal BP readings at home track symptoms and what helps feel better or worse check blood sugar at prescribed times: three times daily enter blood sugar readings and medication or insulin into daily log take the blood sugar log to all doctor visits take the blood sugar meter to all doctor visits eat fish at least once per week fill half of plate with vegetables manage portion size check blood pressure daily write blood pressure results in a log or diary learn about high blood  pressure take blood pressure log to all doctor appointments call doctor for signs and symptoms of high blood pressure keep all doctor appointments take medications for blood pressure exactly as prescribed Limit fluid intake to 1 liter per day Collaborate with managed Medicaid team of BSW and pharmacist to address identified needs Work with cardiology pharmacist to assist with BP stabilization Work with [facility] LCSW and psychiatrist to treat depression Review summary of [employer] benefits mailed to home address and call to enroll in program that will benefit your chronic health issues Continue to use [facility] Care Management calendar to record weight , BP and CBGs so readings are all in one place Schedule dental appt Schedule ENDO appt-completed. Patient to follow up on ENT referral-completed To schedule dentist  appt

## 2023-04-06 NOTE — Patient Outreach (Signed)
Medicaid Managed Care   Nurse Care Manager Note  04/06/2023 Name:  Maria Hudson MRN:  [id number] DOB:  Jul 18, 1960  Maria Hudson is an 62 y.o. year old female who is a primary patient of Maria Contras, MD.  The Medicaid Managed Care Coordination team was consulted for assistance with:    Chronic healthcare management needs, HTN, DM, retinopathy, chronic pain, depression, CHF, gout, OSA, neuropathy, h/o CVA, CKD, CAD  Maria Hudson was given information about Medicaid Managed Care Coordination team services today. Maria Hudson Patient agreed to services and verbal consent obtained.  [name] for follow up visit in response to provider referral for case management and/or care coordination services.   Assessments/Interventions:  Review of past medical history, allergies, medications, health status, including review of consultants reports, laboratory and other test data, was performed as part of comprehensive evaluation and provision of chronic care management services.  SDOH (Social Determinants of Health) assessments and interventions performed: SDOH Interventions    Flowsheet Row Patient Outreach Telephone from 04/06/2023 in [place] POPULATION HEALTH DEPARTMENT Patient Outreach Telephone from 02/08/2023 in [place] POPULATION HEALTH DEPARTMENT Patient Outreach Telephone from 01/08/2023 in [place] POPULATION HEALTH DEPARTMENT Patient Outreach Telephone from 12/09/2022 in [place] POPULATION HEALTH DEPARTMENT Patient Outreach Telephone from 11/05/2022 in [place] POPULATION HEALTH DEPARTMENT Patient Outreach Telephone from 11/02/2022 in [place] POPULATION HEALTH DEPARTMENT  SDOH Interventions        Food Insecurity Interventions -- -- -- -- Intervention Not Indicated --  Housing Interventions -- -- -- Intervention Not Indicated -- --  Transportation Interventions -- -- Intervention Not Indicated -- -- --  Utilities Interventions -- Intervention Not  Indicated -- -- -- --  Alcohol Usage Interventions -- -- -- -- Intervention Not Indicated (Score <7) --  Depression Interventions/Treatment  -- -- -- Counseling, Medication  [patient speaks with [facility] every week] -- --  Financial Strain Interventions -- Intervention Not Indicated -- -- -- --  Stress Interventions -- -- -- -- -- Environmental health practitioner, Provide Counseling  Social Connections Interventions Intervention Not Indicated -- -- -- -- --  Health Literacy Interventions -- -- Intervention Not Indicated -- -- --     Care Plan Allergies  Allergen Reactions   Metformin And Related Nausea Only   Other     Clonidine patch causes skin irritation    Hydrocodone Itching    Patient able to tolerate when taken with Benadryl.   Oxycodone-Acetaminophen Itching    Patient able to tolerate when taken with Benadryl.   Medications Reviewed Today     Reviewed by Maria Hudson (Registered Nurse) on 04/06/23 at 1453  Med List Status: <None>   Medication Order Taking? Sig Documenting Provider Last Dose Status Informant  Accu-Chek Softclix Lancets lancets [id number] No 3 (three) times daily. [provider] Taking Active Self  acetaminophen (TYLENOL) 500 MG tablet [id number] No Take 1,000 mg by mouth every 6 (six) hours as needed for moderate pain or headache. [provider] Taking Active Self  allopurinol (ZYLOPRIM) 100 MG tablet [id number] No Take 100 mg by mouth in the morning. [provider] Taking Active Self  amLODipine (NORVASC) 10 MG tablet [id number] No Take 10 mg by mouth daily. [provider] Taking Active   aspirin EC 81 MG tablet [id number] No Take 81 mg by mouth in the morning. Swallow whole. [provider] Taking Active Self           Med Note (  Maria Hudson   Mon Apr 14, 2021  9:22 PM)    atorvastatin (LIPITOR) 40 MG tablet [id number] No Take 1 tablet (40 mg total) by mouth every evening. Chilton Si, MD  Taking Active Self  buPROPion [facility]) 100 MG tablet [id number] No Take 100 mg by mouth 2 (two) times daily. [provider] Taking Active Self  carvedilol (COREG) 25 MG tablet [id number] No Take 1 tablet (25 mg total) by mouth 2 (two) times daily with a meal. [name], York Ram, MD Taking Active Self  cholecalciferol (VITAMIN D3) 25 MCG (1000 UNIT) tablet [id number] No Take 2,000 Units by mouth daily. [provider] Taking Active Self  cloNIDine (CATAPRES) 0.2 MG tablet [id number]  TAKE 2 TABLETS (0.4 MG TOTAL) BY MOUTH 3 (THREE) TIMES DAILY. [place], [place], FNP  Active   Continuous Blood Gluc Transmit (DEXCOM G6 TRANSMITTER) MISC [id number] No Use one every 3 month [provider] Taking Active   cyclobenzaprine (FLEXERIL) 10 MG tablet [id number] No Take 1 tablet (10 mg total) by mouth at bedtime.  Patient not taking: Reported on 03/18/2023   Maria Bouchard, PA-C Not Taking Active   dapagliflozin propanediol (FARXIGA) 10 MG TABS tablet [id number] No Take 1 tablet (10 mg total) by mouth daily before breakfast. Jacklynn Ganong, FNP Taking Active   ferrous sulfate 325 (65 FE) MG tablet [id number] No Take 325 mg by mouth 2 (two) times daily. [provider] Taking Active Self  fluticasone (FLONASE) 50 MCG/ACT nasal spray [id number] No Place 1 spray into both nostrils daily as needed for allergies or rhinitis. [provider] Taking Active Self  hydrALAZINE (APRESOLINE) 100 MG tablet [id number] No TAKE 1 TABLET BY MOUTH 3 TIMES DAILY. Chilton Si, MD Taking Active   insulin glargine (LANTUS) 100 unit/mL SOPN [id number] No Inject 10 Units into the skin at bedtime. [provider] Taking Active Self           Med Note Maria Hudson, Maria Hudson   Tue Oct 13, 2022  9:23 AM)    isosorbide mononitrate (IMDUR) 120 MG 24 hr tablet [id number] No Take 120 mg by mouth daily. [provider] Taking Active   linaclotide Karlene Einstein) 145  MCG CAPS capsule [id number] No Take 1 capsule (145 mcg total) by mouth daily before breakfast. Zehr, Princella Pellegrini, PA-C Taking Active   loratadine (CLARITIN) 10 MG tablet [id number] No Take 10 mg by mouth in the morning. [provider] Taking Active Self  Multiple Vitamin (MULTIVITAMIN ADULT) TABS [id number] No Take 1 tablet by mouth in the morning. [provider] Taking Active Self  nitroGLYCERIN (NITROSTAT) 0.4 MG SL tablet [id number] No Place 1 tablet (0.4 mg total) under the tongue every 5 (five) minutes as needed for chest pain. [name], York Ram, MD Taking Active Self           Med Note Kittie Plater, Smitty Cords   Tue Aug 19, 2021  9:35 AM)    Rubbie Battiest FLEXPEN 100 UNIT/ML FlexPen [id number] No Inject 5 Units into the skin 3 (three) times daily with meals. [provider] Taking Active Self           Med Note Maria Hudson, Maria Hudson   Tue Oct 13, 2022  9:23 AM)    Omega-3 Fatty Acids (FISH OIL) 1200 MG CAPS [id number] No Take 1,200 mg by mouth in the morning. [provider] Taking Active Self  pregabalin (LYRICA) 25 MG capsule [id number] No Take 25 mg by mouth 2 (two) times daily. [provider] Taking Active Self  Semaglutide, 2 MG/DOSE, (OZEMPIC, 2 MG/DOSE,) 8 MG/3ML SOPN [id number] No Inject 2 mg into the skin once a week. Pavero, Cristal Deer, [facility] Taking Active   sertraline (ZOLOFT) 50 MG tablet [id number] No Take 50 mg by mouth in the morning. [provider] Taking Active Self  sodium phosphate (Maria) 7-19 GM/118ML enema 1 enema [id number]   Sherrilyn Rist, MD  Active   spironolactone (ALDACTONE) 50 MG tablet [id number] No Take 50 mg by mouth daily. [provider] Taking Active   torsemide (DEMADEX) 20 MG tablet [id number] No Take 1 tablet (20 mg total) by mouth daily as needed.  Patient taking differently: Take 20 mg by mouth daily as needed. Patient takes 1 tablet daily   Magna, Anderson [place], [place] Taking Active   TURMERIC  CURCUMIN PO [id number] No Take 500 mg by mouth daily at 6 (six) AM. [provider] Taking Active            Patient Active Problem List   Diagnosis Date Noted   History of colonic polyps 12/28/2022   Chronic constipation 03/31/2022   Pain due to onychomycosis of toenails of both feet 12/05/2021   Pure hypercholesterolemia 05/27/2021   Chronic diastolic CHF (congestive heart failure) (HCC) 04/29/2021   Hypertensive urgency 04/14/2021   Acute on chronic diastolic CHF (congestive heart failure) (HCC) 04/14/2021   Chest pain 03/05/2021   CAD (coronary artery disease) 03/05/2021   T2DM (type 2 diabetes mellitus) (HCC) 03/05/2021   CKD (chronic kidney disease) stage 4, GFR 15-29 ml/min (HCC) 03/05/2021   Class 3 obesity 03/05/2021   CVA (cerebral vascular accident) (HCC) 03/05/2021   Acute on chronic diastolic heart failure (HCC) 03/05/2021   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   AKI (acute kidney injury) (HCC) 03/05/2021   Vitreous hemorrhage of left eye (HCC) 11/14/2020   Orthopnea 08/06/2020   Vision loss of right eye 08/06/2020   Excessive daytime sleepiness 01/11/2020   Iron deficiency anemia 12/14/2019   Vitreous hemorrhage, right (HCC) 12/04/2019   Diabetic neuropathy (HCC) 07/26/2019   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) 02/03/2019   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   Conductive hearing loss, bilateral 03/03/2018   Chronic bilateral low back pain without sciatica 08/21/2015   Chronic kidney disease (CKD) stage G3b/A3, moderately decreased glomerular filtration rate (GFR) between 30-44 mL/min/1.73 square meter and albuminuria creatinine ratio greater than 300 mg/g (HCC) 04/30/2015   Type 2 diabetes mellitus with diabetic polyneuropathy, with long-term current use of insulin (HCC) 02/15/2015   Advance directive on file 05/17/2014   Refusal of blood transfusions as patient is Jehovah's  Witness 05/17/2014   OSA treated with BiPAP 02/23/2014   Coronary artery disease 07/07/2013   Severe obesity (BMI >= 40) (HCC) 05/18/2013   Neuropathy 04/11/2013   Diastolic heart failure (HCC) 10/16/2012   Essential hypertension 10/16/2012   Conditions to be addressed/monitored per PCP order:  Chronic healthcare management needs, HTN, DM, retinopathy, chronic pain, depression, CHF, gout, OSA, neuropathy, h/o CVA, CKD, CAD  Care Plan : [facility] Care Manager Plan Of Care  Updates made by Maria Hudson since 04/06/2023 12:00 AM     Problem: Knowledge Deficit and Care Coordination Needs Related to Management of HTN, HF, Type 2 DM   Priority: High     Long-Range Goal: Development of Plan of Care to Address Knowledge Deficits and Care Coordination Needs related to  management of CHF,  CAD, HTN, HLD, DMII, CKD Stage 4, and Depression   Start Date: 05/06/2021  Expected End Date: 07/07/2023  Priority: High  Note:   Current Barriers:  Knowledge Deficits related to plan of care for management of CHF, CAD, HTN, HLD, DMII, CKD Stage 4, and Depression 04/06/23:  PT continues, to res[facility]dule [facility] appt. BP and BG stable.  Blood sugar 125-210.  Hasn't  RNCM Clinical Goal(s):  Patient will demonstrate ongoing adherence to prescribed treatment plan for CHF, CAD, HTN, HLD, DMII, CKD Stage 4, and Depression as evidenced by patient report of improved health and quality of life and no unplanned ED or unplanned hospital admissions continue to work with [facility] Care Manager and/or Social Worker to address care management and care coordination needs related to CHF, CAD, HTN, HLD, DMII, CKD Stage 4, and Depression as evidenced by adherence to CM Team Scheduled appointments      Work with [facility] LCSW and psychiatrist to treat depression  Interventions: Inter-disciplinary care team collaboration (see longitudinal plan of care) Evaluation of current treatment plan related to  self management and patient's  adherence to plan as established by provider Mailed summary of benefits for her [facility] Managed Medicaid plan to her home address Collaborated with Care Guide for dental resources-completed. Care Guide referral for dental resources Collaborated with BSW for assistance with [facility] appt.-completed. BSW referral for appt assistance. Collaborated with Pharmacy regarding  uncontrolled HTN Pharmacy referral for uncontrolled HTN-completed. Collaborated with LCSW for depression LCSW referral for depression-completed BSW referral for dental and PCP resources-completed. Collaborated with BSW  CAD Interventions: (Status:  New goal.) Long Term Goal Assessed understanding of CAD diagnosis Medications reviewed including medications utilized in CAD treatment plan Provided education on importance of blood pressure control in management of CAD Counseled on importance of regular laboratory monitoring as prescribed Reviewed Importance of taking all medications as prescribed Reviewed Importance of attending all scheduled provider appointments Advised to report any changes in symptoms or exercise tolerance Assessed social determinant of health barriers  Hyperlipidemia Interventions:  (Status:  New goal.) Long Term Goal Medication review performed; medication list updated in electronic medical record.  Provider established cholesterol goals reviewed Counseled on importance of regular laboratory monitoring as prescribed Reviewed importance of limiting foods high in cholesterol Assessed social determinant of health barriers     Heart Failure Interventions:  (Status: Goal on Track (progressing): YES.)  Long Term Goal - patient states her fluid retention has significantly improved,  says she cannot tolerate the compression stockings because they are too tight and make her legs hurt Assessed patient's heart failure management strategies  Diabetes:  (Status: Goal on Track (progressing): YES.) Long Term  Goal   Lab Results  Component Value Date   HGBA1C 6.7 (H) 04/29/2021   Hgn A1C=6.3 on 12/26/21 Hgb A1C=5.2 in October per patient HGBA1C=6.2 on 02/22/23  Assessed patient's understanding of A1C goal: <7% Reviewed medications with patient and discussed importance of medication adherence;        Counseled on importance of regular laboratory monitoring as prescribed;        Discussed plans with patient for ongoing care management follow up and provided patient with direct contact information for care management team;      Advised patient, providing education and rationale, to check cbg three times daily and record        call provider for findings outside established parameters;       Review of patient status, including review of consultants reports, relevant  laboratory and other test results, and medications completed;       Discussed addition of Ozempic to medication regimen , reviewed mechanism of action and proven cardiovascular benefits To schedule ENDO appt-discussed nutrition referral, [facility]-END[facility] appt scheduled-patient declines referral.  Hypertension and CKD stage 4 : (Status: Goal on Track (progressing): YES.) Long Term Goal-  has been placed on kidney transplant waiting list, fistu;a placed 12/09/21,ready for use 03/11/22.  BP Readings from Last 3 Encounters:        02/02/23 164/70  04/06/23          144/75 01/08/23         126-178/66-85    Most recent eGFR/CrCl:    No components found for: CRCL Lab Results  Component Value Date   NA 136 08/19/2021   K 3.9 08/19/2021   CREATININE 2.51 (H) 08/19/2021   EGFR 19 (L) 03/13/2021   GFRNONAA 22 (L) 08/19/2021   GLUCOSE 122 (H) 08/19/2021    Evaluation of current treatment plan related to hypertension self management and patient's adherence to plan as established by provider;   Reviewed medications with patient and discussed importance of compliance;  Discussed plans with patient for ongoing care  management follow up and provided patient with direct contact information for care management team; Advised patient, providing education and rationale, to monitor blood pressure daily and record, calling PCP for findings outside established parameters;  Reviewed scheduled/upcoming provider appointments   Weight Loss Interventions:  (Status:  New goal.) Long Term Goal--patient has started Ozempic Discussed indications, mechanism of action, common side effects and CV benefits  of GLP1 Ozempic Assessed tolerance of Ozempic  Interdisciplinary Collaboration:  (Status: Goal on Track (progressing): YES.) Long Term Goal  Collaborated with BSW to initiate plan of care to address needs related to Housing barriers, Inability to perform ADL's independently, and Inability to perform IADL's independently in patient with CHF, CAD, HTN, HLD, DM, CKD Stage 4, and Depression Assessed status of patient's involvement with behavioral health counselor Collaboration with pharmacist for medication review, discussion of ? Medication side effects causing chronic constipation and arranging for patient to received home delivery of medications in blister packs  Patient Goals/Self-Care Activities: Take medications as prescribed   Attend all scheduled provider appointments Call pharmacy for medication refills 3-7 days in advance of running out of medications Attend church or other social activities Perform all self care activities independently  Call provider office for new concerns or questions  Work with the social worker to address care coordination needs and will continue to work with the clinical team to address health care and disease management related needs call office if I gain more than 2 pounds in one day or 5 pounds in one week keep legs up while sitting track weight in diary use salt in moderation watch for swelling in feet, ankles and legs every day weigh myself daily know when to call the doctorfor fluid  weight gain or consistently abnormal BP readings at home track symptoms and what helps feel better or worse check blood sugar at prescribed times: three times daily enter blood sugar readings and medication or insulin into daily log take the blood sugar log to all doctor visits take the blood sugar meter to all doctor visits eat fish at least once per week fill half of plate with vegetables manage portion size check blood pressure daily write blood pressure results in a log or diary learn about high blood pressure take blood pressure log to  all doctor appointments call doctor for signs and symptoms of high blood pressure keep all doctor appointments take medications for blood pressure exactly as prescribed Limit fluid intake to 1 liter per day Collaborate with managed Medicaid team of BSW and pharmacist to address identified needs Work with cardiology pharmacist to assist with BP stabilization Work with [facility] LCSW and psychiatrist to treat depression Review summary of [employer] benefits mailed to home address and call to enroll in program that will benefit your chronic health issues Continue to use [facility] Care Management calendar to record weight , BP and CBGs so readings are all in one place Schedule dental appt Schedule ENDO appt-completed. Patient to follow up on ENT referral-completed To schedule dentist appt   Long-Range Goal: Establish Plan of Care for Chronic Disease Management Needs   Priority: High  Note:   Timeframe:  Long-Range Goal Priority:  High Start Date:     11/12/21                        Expected End Date:  ongoing                     Follow Up Date: 05/07/23   - schedule appointment for vaccines needed due to my age or health - schedule recommended health tests (blood work, mammogram, colonoscopy, pap test) - schedule and keep appointment for annual check-up    Why is this important?   Screening tests can find diseases early when they are  easier to treat.  Your doctor or nurse will talk with you about which tests are important for you.  Getting shots for common diseases like the flu and shingles will help prevent them.  04/06/23:  PT continues, to [facility]t.  GO 10/23, recent ENT appt   Follow Up:  Patient agrees to Care Plan and Follow-up.  Plan: The Managed Medicaid care management team will reach out to the patient again over the next 30 business  days. and The  Patient has been provided with contact information for the Managed Medicaid care management team and has been advised to call with any health related questions or concerns.  Date/time of next scheduled Hudson care management/care coordination outreach:  05/07/23 at 315

## 2023-04-07 ENCOUNTER — Other Ambulatory Visit (INDEPENDENT_AMBULATORY_CARE_PROVIDER_SITE_OTHER): Payer: Medicaid Other

## 2023-04-07 ENCOUNTER — Ambulatory Visit: Payer: Medicaid Other | Admitting: Physical Therapy

## 2023-04-07 ENCOUNTER — Encounter: Payer: Self-pay | Admitting: Gastroenterology

## 2023-04-07 ENCOUNTER — Encounter: Payer: Self-pay | Admitting: Physical Therapy

## 2023-04-07 ENCOUNTER — Ambulatory Visit: Payer: Medicaid Other | Admitting: Gastroenterology

## 2023-04-07 VITALS — BP 150/82 | HR 71 | Ht 63.0 in | Wt 220.0 lb

## 2023-04-07 DIAGNOSIS — G8929 Other chronic pain: Secondary | ICD-10-CM

## 2023-04-07 DIAGNOSIS — M62830 Muscle spasm of back: Secondary | ICD-10-CM | POA: Diagnosis not present

## 2023-04-07 DIAGNOSIS — K5909 Other constipation: Secondary | ICD-10-CM | POA: Diagnosis not present

## 2023-04-07 DIAGNOSIS — R159 Full incontinence of feces: Secondary | ICD-10-CM

## 2023-04-07 DIAGNOSIS — R262 Difficulty in walking, not elsewhere classified: Secondary | ICD-10-CM | POA: Diagnosis not present

## 2023-04-07 DIAGNOSIS — M25512 Pain in left shoulder: Secondary | ICD-10-CM | POA: Diagnosis not present

## 2023-04-07 DIAGNOSIS — M542 Cervicalgia: Secondary | ICD-10-CM

## 2023-04-07 DIAGNOSIS — R252 Cramp and spasm: Secondary | ICD-10-CM

## 2023-04-07 DIAGNOSIS — M5459 Other low back pain: Secondary | ICD-10-CM | POA: Diagnosis not present

## 2023-04-07 LAB — IBC + FERRITIN
Ferritin: 301.1 ng/mL — ABNORMAL HIGH (ref 10.0–291.0)
Iron: 49 ug/dL (ref 42–145)
Saturation Ratios: 18.4 % — ABNORMAL LOW (ref 20.0–50.0)
TIBC: 266 ug/dL (ref 250.0–450.0)
Transferrin: 190 mg/dL — ABNORMAL LOW (ref 212.0–360.0)

## 2023-04-07 LAB — B12 AND FOLATE PANEL
Folate: >24.2 ng/mL (ref >5.9)
Vitamin B-12: 1155 pg/mL — ABNORMAL HIGH (ref 211–911)

## 2023-04-07 NOTE — Patient Instructions (Addendum)
Your provider has requested that you go to the basement level for lab work before leaving today. Press "B" on the elevator. The lab is located at the first door on the left as you exit the elevator.   We are sending a referral for you to [facility]- please give them at least two weeks to reach out to you for a appointment   Stop your iron.  Please purchase the following medications over the counter and take as directed: Glycerin suppository daily.  _______________________________________________________  If your blood pressure at your visit was 140/90 or greater, please contact your primary care physician to follow up on this.  _______________________________________________________  If you are age 22 or older, your body mass index should be between 23-30. Your Body mass index is 38.97 kg/m. If this is out of the aforementioned range listed, please consider follow up with your Primary Care Provider.  If you are age 61 or younger, your body mass index should be between 19-25. Your Body mass index is 38.97 kg/m. If this is out of the aformentioned range listed, please consider follow up with your Primary Care Provider.   ________________________________________________________  The  GI providers would like to encourage you to use [facility] to communicate with providers for non-urgent requests or questions.  Due to long hold times on the telephone, sending your provider a message by [facility] may be a faster and more efficient way to get a response.  Please allow 48 business hours for a response.  Please remember that this is for non-urgent requests.  _______________________________________________________ It was a pleasure to see you today!  Thank you for trusting me with your gastrointestinal care!

## 2023-04-07 NOTE — Therapy (Signed)
OUTPATIENT PHYSICAL THERAPY SHOULDER/CERVICAL TREATMENT   Patient Name: Maria Hudson MRN: [id number] DOB:1960-06-30, 62 y.o., female Today's Date: 04/07/2023  END OF SESSION:  PT End of Session - 04/07/23 1144     Visit Number 5    Number of Visits 17    Date for PT Re-Evaluation 05/13/23    Authorization Type [place] [facility]    Authorization Time Period Approved 10 visits 03/22/23-05/21/23    Authorization - Visit Number 4    Authorization - Number of Visits 10    PT Start Time 1145    PT Stop Time 1225    PT Time Calculation (min) 40 min               Past Medical History:  Diagnosis Date   Acute on chronic diastolic heart failure (HCC) 03/05/2021   Anemia    Arthritis    CAD (coronary artery disease)    CHF (congestive heart failure) (HCC)    CKD (chronic kidney disease) stage 4, GFR 15-29 ml/min (HCC) 03/05/2021   Colon polyps    CVA (cerebral vascular accident) (HCC)    Depression    Diabetes mellitus without complication (HCC)    type 2   Dyspnea    Hypertension    Hypertensive urgency 04/14/2021   Memory loss    mild   OSA (obstructive sleep apnea) 05/27/2021   uses CPAP 12-18 cm   Pneumonia    several times   Pure hypercholesterolemia 05/27/2021   Renal disorder    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   Stroke [facility])    Past Surgical History:  Procedure Laterality Date   ABDOMINAL HYSTERECTOMY     AV FISTULA PLACEMENT Left 12/09/2021   Procedure: LEFT ARTERIOVENOUS (AV) FISTULA CREATION;  Surgeon: Maeola Harman, MD;  Location: [facility] OR;  Service: Vascular;  Laterality: Left;   BIOPSY  09/08/2021   Procedure: BIOPSY;  Surgeon: Sherrilyn Rist, MD;  Location: [facility] ENDOSCOPY;  Service: Gastroenterology;;   CESAREAN SECTION     x 1   ESOPHAGOGASTRODUODENOSCOPY (EGD) WITH PROPOFOL N/A 09/08/2021   Procedure: ESOPHAGOGASTRODUODENOSCOPY (EGD) WITH PROPOFOL;  Surgeon: Sherrilyn Rist, MD;  Location: [facility]OSCOPY;  Service: Gastroenterology;   Laterality: N/A;  chest pain, epigastric pain   INNER EAR SURGERY Bilateral    RIGHT HEART CATH N/A 05/14/2021   Procedure: RIGHT HEART CATH;  Surgeon: Laurey Morale, MD;  Location: [facility] INVASIVE CV LAB;  Service: Cardiovascular;  Laterality: N/A;   TONSILLECTOMY     age 65   Patient Active Problem List   Diagnosis Date Noted   History of colonic polyps 12/28/2022   Chronic constipation 03/31/2022   Pain due to onychomycosis of toenails of both feet 12/05/2021   Pure hypercholesterolemia 05/27/2021   Chronic diastolic CHF (congestive heart failure) (HCC) 04/29/2021   Hypertensive urgency 04/14/2021   Acute on chronic diastolic CHF (congestive heart failure) (HCC) 04/14/2021   Chest pain 03/05/2021   CAD (coronary artery disease) 03/05/2021   T2DM (type 2 diabetes mellitus) (HCC) 03/05/2021   CKD (chronic kidney disease) stage 4, GFR 15-29 ml/min (HCC) 03/05/2021   Class 3 obesity 03/05/2021   CVA (cerebral vascular accident) (HCC) 03/05/2021   Acute on chronic diastolic heart failure (HCC) 03/05/2021   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   AKI (acute kidney injury) (HCC) 03/05/2021   Vitreous hemorrhage of left eye (HCC) 11/14/2020   Orthopnea 08/06/2020   Vision loss of right eye 08/06/2020   Excessive daytime sleepiness 01/11/2020  Iron deficiency anemia 12/14/2019   Vitreous hemorrhage, right (HCC) 12/04/2019   Diabetic neuropathy (HCC) 07/26/2019   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) 02/03/2019   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   Conductive hearing loss, bilateral 03/03/2018   Chronic bilateral low back pain without sciatica 08/21/2015   Chronic kidney disease (CKD) stage G3b/A3, moderately decreased glomerular filtration rate (GFR) between 30-44 mL/min/1.73 square meter and albuminuria creatinine ratio greater than 300 mg/g (HCC) 04/30/2015   Type 2 diabetes mellitus with diabetic  polyneuropathy, with long-term current use of insulin (HCC) 02/15/2015   Advance directive on file 05/17/2014   Refusal of blood transfusions as patient is Jehovah's Witness 05/17/2014   OSA treated with BiPAP 02/23/2014   Coronary artery disease 07/07/2013   Severe obesity (BMI >= 40) (HCC) 05/18/2013   Neuropathy 04/11/2013   Diastolic heart failure (HCC) 10/16/2012   Essential hypertension 10/16/2012    PCP: Fleet Contras MD   REFERRING PROVIDER: Dr. Geraldine Solar, Allayne Gitelman, PA-C  REFERRING DIAG:  [phone] (ICD-10-CM) - Chronic left shoulder pain  M54.2 (ICD-10-CM) - Neck pain    THERAPY DIAG:  Cervicalgia  Chronic left shoulder pain  Cramp and spasm  Rationale for Evaluation and Treatment: Rehabilitation  ONSET DATE: chronic   SUBJECTIVE:      Today we are at a 4/10, better than it was the other day.                                                       SUBJECTIVE STATEMENT: Pt has had chronic L sided neck and shoulder pain, since 1996. She was working at [employer] and she slipped and fell a few times within a few days.  She has had pain since then.   She was not treated at that time. The doctor said it is likely coming from my neck. She had XR but no MRI.  Dr. Magnus Ivan injected her shoulder yesterday and no change as of today.   She reports difficulty walking, turning her head, using her L arm for home tasks and ADLs. "My whole L side is weaker"  Reports N/T L hand  Weaker in L UE  She also complains of bilateral hip and back pain, for which she is not being treated.  Does have issues with balance but not sure why.  She did have a stroke but reports that only affected her memory.  She is not sure if she has Neuropathy.      Hand dominance: Right  PERTINENT HISTORY: Diabetes, CVA, back pain     PAIN: 03/26/23 Are you having pain? Yes: NPRS scale: 4/10 Pain location: L neck  Pain description: tight, stiff Aggravating factors: sleeping on L  side, activity  Relieving factors: Tylenol, hot bath Headache often   PRECAUTIONS: None  RED FLAGS: None   WEIGHT BEARING RESTRICTIONS: No  FALLS:  Has patient fallen in last 6 months? No   LIVING ENVIRONMENT: Lives with: lives with their family Lives in: House/apartment Stairs: Yes: Internal: 12 steps; on left going up Has following equipment at home: Single point cane and Walker - 2 wheeled Used walker about a month (4 wheeled)   OCCUPATION: Not working right now "Little bit if everything"  PLOF: Independent, Independent with household mobility without  device, Independent with community mobility with device, Needs assistance with ADLs, Leisure: family time, TV, cards and games, and not exercising.  I used to love to exercise.  She used to do at home on her own.   Pt does not have a bed.  Sleeps on a couch.  Walking is painful hips, low back.    PATIENT GOALS: I want to get out of pain.    NEXT MD VISIT:   OBJECTIVE:  Note: Objective measures were completed at Evaluation unless otherwise noted.  DIAGNOSTIC FINDINGS:  Left shoulder 3 views: Shoulder is well located.  Subacromial space  well-maintained.  Mild AC joint arthritic changes.  Glenohumeral joints  well-maintained.  No acute fractures bony abnormalities.  Cervical spine 2 views: No acute fracture.  Loss of lordotic curvature.   Diminished disc space C6 -C7.  No spondylolisthesis.  No acute fractures.   No other bony abnormalities.  PATIENT SURVEYS:  FOTO 43% neck, 43% shoulder   COGNITION: Overall cognitive status: History of cognitive impairments - at baseline memory per pt.      SENSATION: WFL   POSTURE: Forward head, somewhat rounded upper back   UPPER EXTREMITY ROM:  Active ROM Right eval Left eval Left 04/07/23  Shoulder flexion [facility]  WFL pain  WFL Pain  Shoulder extension     Shoulder abduction WFL  120 pain  150 pain  Shoulder adduction     Shoulder internal rotation WFL Min pain  reaches to low back  Mid thoracic No pain   Shoulder external rotation WFL  Pain , limited but NT T2 pain  Elbow flexion     Elbow extension     Wrist flexion     Wrist extension     Wrist ulnar deviation     Wrist radial deviation     Wrist pronation     Wrist supination     (Blank rows = not tested)   CERVICAL AROM  EVAL    flexion 30 deg    extension 50 deg    L SB  25    R SB  40    L rotation 25    R rotation  WNL          UPPER EXTREMITY MMT:  MMT Right eval Left eval  Shoulder flexion 4 4-  Shoulder extension    Shoulder abduction 4 4-  Shoulder adduction    Shoulder internal rotation NT 4  Shoulder external rotation NT  4  Middle trapezius    Lower trapezius    Elbow flexion  5  Elbow extension  4  Wrist flexion    Wrist extension    Wrist ulnar deviation    Wrist radial deviation    Wrist pronation    Wrist supination    Grip strength (lbs)    (Blank rows = not tested)  FUNCTIONAL TESTS 03/26/23: 2 MWT 344 feet (LBP/Hip pain 4/10 increased to  7/10) 03/26/23: 14.1 sec  no AD, staggers with turn to sit , corrects with reaching strategy  03/26/23: BERG Balance: 52/56  SHOULDER SPECIAL TESTS: NT  JOINT MOBILITY TESTING:  Unable to tolerate in cervical spine No stiffness in L GH joint   PALPATION:  Pain and spasm L upper trap   TODAY'S TREATMENT:   [facility] Adult PT Treatment:  DATE: 04/07/23 Therapeutic Exercise: Upper trap and levator stretch  Shoulder rolls  Seated scap retract  Seated chin tuck  Row Red 5 sec x 10 Standing shoulder flexion pullovers with dowel x 8 Standing ER with scap retract  YTB 10 x 2 , min pain L shoulder Doorway stretch x 2 for 30 sec  Open books standified for turning left due to increased pain.  Supine yellow band horix  Supine alt diagonals yellow Supine chin tuck towel roll Supine chin tuck with towel roll 5 sec x 10 DNF 3 sec x 5   Manual Therapy: Cervical  distraction  Passive cervical rotation     [facility] Adult PT Treatment:                                                DATE: 04/05/23 Therapeutic Exercise: Cervical stretches, review and perform x 3 each side: upper trap, rotation Supine small ROM rotation with towel under neck  Supine chin tuck x 5 sec x 10  Scapular retraction x 10   Chin tuck with slight rotation x 5 each side  Supine chin tuck with shoulder flexion with dowel  Supine horizontal abduction and narrow grip flexion yellow 2 x 10  ER Yellow band 2 x 15 , min pain L shoulder Standing against wall for shoulder flexion  Standing open book  x 5 offered supine as well  Manual Therapy: NA  Self Care: HEP techniques and streamlining for routine    [facility] Adult PT Treatment:                                                DATE: 04/02/23 Therapeutic Exercise: Supine chin tuck x10 3" Supine DNF lift offs x10 5" Seated Scapular Retraction 10 reps - 5 hold Seated Chin Tuck with Neck Elongation 10 reps - 5 hold Seated R Cervical Sidebending Stretch 3 reps - 20 hold Seated R Cervical Rotation with Nod 3 reps - 20 hold Manual Therapy: Skilled palpation to the L upper trap to identify TrPs and taut muscle bands  STM to the upper trap, levator, and cervical paraspinals Cervical tractions Trigger Point Dry Needling Treatment: Pre-treatment instruction: Patient instructed on dry needling rationale, procedures, and possible side effects including pain during treatment (achy,cramping feeling), bruising, drop of blood, lightheadedness, nausea, sweating. Patient Consent Given: Yes Education handout provided: Yes Muscles treated: L upper trap  Needle size and number: .30x38mm x 1 Electrical stimulation performed: No Parameters: N/A Treatment response/outcome: Twitch response elicited Post-treatment instructions: Patient instructed to expect possible mild to moderate muscle soreness later today and/or tomorrow. Patient instructed in  methods to reduce muscle soreness and to continue prescribed HEP. If patient was dry needled over the lung field, patient was instructed on signs and symptoms of pneumothorax and, however unlikely, to see immediate medical attention should they occur. Patient was also educated on signs and symptoms of infection and to seek medical attention should they occur. Patient verbalized understanding of these instructions and education.  [facility] Adult PT Treatment:                                                DATE: 03/26/23 Therapeutic Exercise: Nustep L4 LE x 6 minutes only due to increased neck pain in first minute Therapeutic Activity: BERG BALANCE TEST Sitting to Standing: 4.      Stands without using hands and stabilize independently Standing Unsupported: 4.      Stands safely for 2 minutes Sitting Unsupported: 4.     Sits for 2 minutes independently Standing to Sitting: 4.     Sits safely with minimal use of hands Transfers: 4.     Transfers safely with minor use of hands Standing with eyes closed: 4.     Stands safely for 10 seconds  Standing with feet together: 4.     Stands for 1 minute safely Reaching forward with outstretched arm: 4.     Reaches forward 10 inches Retrieving object from the floor: 4.      Able to pick up easily and safely Turning to look behind: 4.     Looks behind from both sides and weight shifts well Turning 360 degrees: 4.     Able to turn in </=4 seconds  Place alternate foot on stool: 4.     Completes 8 steps in 20 seconds     Standing with one foot in front: 3.     Independent foot ahead for 30 seconds Standing on one foot: 1.     Holds <3 seconds Total Score: 52/56  2 MWT 344 feet without AD  TUG: 14.1 sec without AD, stagger with turn to sit, correct with reaching strategy Modalities: HMP to lumbar and cervical x 15 minutes end of  session   DATE:  03/18/23 PT eval  HEP Manual therapy to cervical spine with light suboccipitals and distraction     PATIENT EDUCATION: Education details: PT, POC, HEP  Person educated: Patient Education method: Programmer, multimedia, Demonstration, Verbal cues, and Handouts Education comprehension: verbalized understanding, returned demonstration, and needs further education  HOME EXERCISE PROGRAM: Access Code: [email or web address] URL: [email or web address] Date: 04/05/2023 Prepared by: Karie Mainland  Exercises - Seated Scapular Retraction  - 2 x daily - 7 x weekly - 2 sets - 10 reps - 5 hold - Seated Chin Tuck with Neck Elongation  - 2 x daily - 7 x weekly - 2 sets - 10 reps - 5 hold - Seated Cervical Sidebending Stretch  - 2 x daily - 7 x weekly - 1 sets - 3-5 reps - 20-30 hold - Seated Cervical Rotation with Nod  - 1 x daily - 7 x weekly - 1 sets - 3-5 reps - 20-30 hold - Supine Cervical Retraction with Towel  - 2 x daily - 7 x weekly - 1 sets - 10 reps - 3 hold - Supine DNF Liftoffs  - 2 x daily - 7 x weekly - 1 sets - 10 reps - 5 hold - Standing Thoracic Open Book at Wall  - 1 x daily - 7 x weekly - 2 sets - 10 reps - 5-10 hold - Standing Shoulder Flexion AAROM with Dowel  - 1 x daily - 7 x weekly - 2 sets - 10 reps - 5 hold - Shoulder External Rotation and Scapular Retraction with Resistance  - 1 x daily - 7  x weekly - 2 sets - 10 reps - 5 hold - Standing Shoulder Horizontal Abduction with Resistance  - 1 x daily - 7 x weekly - 2 sets - 10 reps - 5 hold   ASSESSMENT:  CLINICAL IMPRESSION: Pt demonstrates improved shoulder AROM but with 4/10 pain in flexion, abduction and ER. Pain level less than last visit. Has been working on her HEP. Pt reports that she walks at a store almost daily, not walking in neighborhood. Continued with standing and supine neck/thoracic and shoulder stability and mobility. Modifications provided for standing open books which are more painful to the left.   At end of session she did report that her neck and shoulder felt better despite some increase in pain with therex.    OBJECTIVE IMPAIRMENTS: Abnormal gait, decreased activity tolerance, decreased balance, decreased coordination, decreased mobility, difficulty walking, decreased ROM, decreased strength, increased fascial restrictions, increased muscle spasms, impaired flexibility, impaired UE functional use, postural dysfunction, obesity, and pain.   ACTIVITY LIMITATIONS: carrying, lifting, bending, standing, squatting, sleeping, stairs, transfers, bed mobility, dressing, reach over head, and locomotion level  PARTICIPATION LIMITATIONS: meal prep, cleaning, laundry, interpersonal relationship, shopping, and community activity  PERSONAL FACTORS: Past/current experiences, Time since onset of injury/illness/exacerbation, and 3+ comorbidities: CVA, chronic pain, diabetes   are also affecting patient's functional outcome.   REHAB POTENTIAL: Good  CLINICAL DECISION MAKING: Evolving/moderate complexity  EVALUATION COMPLEXITY: Moderate   GOALS: Goals reviewed with patient? Yes  SHORT TERM GOALS: Target date: 04/15/2023    Pt will be I with cervical AROM and posture HEP Baseline unknown Goal status: MET  2.  Pt will be able to improve AROM of L shoulder to WNL with min increase in shoulder pain  Baseline: limited in functional reach 04/07/23: WNL however pain   Goal status: ongoing   3.  Patient will be screened for balance/fall risk and goal set Baseline: NT on eval  Goal status: MET   LONG TERM GOALS: Target date: 05/13/2023    Patient will be independent with home exercise program upon discharge Baseline: unknown  Goal status: INITIAL  2.  Patient will demo cervical active range of motion without increased pain  Baseline: limited in rotation, extension and side bending  04/07/23: continued visual limitation esp left sb and left rotation Goal status: ONGOING  3.  Patient  will report min increase in pain with ADLs and light housework Baseline: mod to severe  Goal status: INITIAL  4.  Patient will be able to walk up her stairs with min increase in pain in order to do laundry Baseline: avoid this due to hip pain  Goal status: INITIAL  5.  Patient will participate in short, daily walking program in order to improve overall health and mobility. Baseline: does not do this  07/07/22: walk in store nearly daily.  Goal status: INITIAL  6.  Patient will score < 12 sec on TUG without LOB indicating decreased fall risk.  Baseline: 14 sec Goal status: NEW   PLAN:  PT FREQUENCY: 2x/week  PT DURATION: 8 weeks  PLANNED INTERVENTIONS: Therapeutic exercises, Therapeutic activity, Neuromuscular re-education, Balance training, Gait training, Patient/Family education, Self Care, Joint mobilization, DME instructions, Aquatic Therapy, Dry Needling, Electrical stimulation, Spinal mobilization, Cryotherapy, Moist heat, Taping, Ionotophoresis 4mg /ml Dexamethasone, Manual therapy, and Re-evaluation  PLAN FOR NEXT SESSION: check HEP, manual therapy, Cervical traction, modalities, TPDN, work on balance goals, steps, etc, see goals  ,  Jannette Spanner, PTA 04/07/23 1:01 PM Phone: [phone] Fax: [phone]

## 2023-04-07 NOTE — Progress Notes (Signed)
GI Progress Note  Chief Complaint:  Chief Complaint  Patient presents with   Constipation    Constipation has gotten better since colonoscopy, softer stools daily    Subjective  History: Maria Hudson is here for follow-up of constipation, having been seen in the office by our APP in July of this year.  Previously on Linzess 72 mcg daily with as needed MiraLAX, though the latter sometimes caused loose stool.  Linzess dose was increased to 145 daily.  Surveillance colonoscopy for history of subcentimeter adenomatous polyp at [facility] in 2017 was performed on 01/06/2023 by Dr. Adela Lank.  (Originally scheduled with me 2 days prior but prep was poor, so required additional bowel preparation and exam with another physician).  Unfortunately, bowel preparation remained poor in the exam by Dr. Adela Lank despite a large amount of lavage.  Multiple hyperplastic polyps removed. ______________________  Today, she reports compliance with Linzess 145 mcg and states that she typically has normal BM with soft stool; however, this past week, she's been experiencing the urge to have a BM but is not able too. She states that she's able to feel a pressure in her perianal region but is not able to induce a BM.  It feels like there is stool there and a lump and pressure but often it will not pass.  She states that with Linzess, she can experience loose stools which will occasionally result in experiencing fecal incontinence. She states that her last BM was last Tuesday. She reports also occasionally experiencing accompanying urine incontinence and passing gas unexpectedly.  She reports being on iron supplements for chronic anemia but she hasn't checked her levels in quite some time. She reports not following up with an OBYGN for the past few years as she has underwent a hysterectomy. We also reviewed her latest colonoscopy and discussed how her redundant colon anatomy is contributing to her  symptoms.  Patient denies any  nausea, blood in stool, black stool, vomiting, abdominal pain, unintentional weight loss, reflux, dysphagia.   ROS: Review of Systems  Constitutional:  Negative for appetite change and fever.  HENT:  Negative for trouble swallowing.   Respiratory:  Negative for cough and shortness of breath.   Cardiovascular:  Negative for chest pain.  Gastrointestinal:  Positive for constipation and diarrhea. Negative for abdominal distention, abdominal pain, anal bleeding, blood in stool, nausea, rectal pain and vomiting.       +GAS  Genitourinary:  Negative for dysuria.  Musculoskeletal:  Negative for back pain.  Skin:  Negative for rash.  Neurological:  Negative for weakness.  All other systems reviewed and are negative.    The patient's Past Medical, Family and Social History were reviewed and are on file in the EMR.  Objective:  Med list reviewed  Current Outpatient Medications:    Accu-Chek Softclix Lancets lancets, 3 (three) times daily., Disp: , Rfl:    acetaminophen (TYLENOL) 500 MG tablet, Take 1,000 mg by mouth every 6 (six) hours as needed for moderate pain or headache., Disp: , Rfl:    allopurinol (ZYLOPRIM) 100 MG tablet, Take 100 mg by mouth in the morning., Disp: , Rfl:    amLODipine (NORVASC) 10 MG tablet, Take 10 mg by mouth daily., Disp: , Rfl:    aspirin EC 81 MG tablet, Take 81 mg by mouth in the morning. Swallow whole., Disp: , Rfl:    atorvastatin (LIPITOR) 40 MG tablet, Take 1 tablet (40 mg total) by mouth every evening., Disp: 90 tablet,  Rfl: 3   buPROPion (WELLBUTRIN) 100 MG tablet, Take 100 mg by mouth 2 (two) times daily., Disp: , Rfl:    carvedilol (COREG) 25 MG tablet, Take 1 tablet (25 mg total) by mouth 2 (two) times daily with a meal., Disp: 60 tablet, Rfl: 0   cholecalciferol (VITAMIN D3) 25 MCG (1000 UNIT) tablet, Take 2,000 Units by mouth daily., Disp: , Rfl:    cloNIDine (CATAPRES) 0.2 MG tablet, TAKE 2 TABLETS (0.4 MG TOTAL) BY  MOUTH 3 (THREE) TIMES DAILY., Disp: 504 tablet, Rfl: 3   Continuous Blood Gluc Transmit (DEXCOM G6 TRANSMITTER) MISC, Use one every 3 month, Disp: , Rfl:    cyclobenzaprine (FLEXERIL) 10 MG tablet, Take 1 tablet (10 mg total) by mouth at bedtime. (Patient not taking: Reported on 03/18/2023), Disp: 30 tablet, Rfl: 0   dapagliflozin propanediol (FARXIGA) 10 MG TABS tablet, Take 1 tablet (10 mg total) by mouth daily before breakfast., Disp: 30 tablet, Rfl: 11   ferrous sulfate 325 (65 FE) MG tablet, Take 325 mg by mouth 2 (two) times daily., Disp: , Rfl:    fluticasone (FLONASE) 50 MCG/ACT nasal spray, Place 1 spray into both nostrils daily as needed for allergies or rhinitis., Disp: , Rfl:    hydrALAZINE (APRESOLINE) 100 MG tablet, TAKE 1 TABLET BY MOUTH 3 TIMES DAILY., Disp: 270 tablet, Rfl: 2   insulin glargine (LANTUS) 100 unit/mL SOPN, Inject 10 Units into the skin at bedtime., Disp: , Rfl:    isosorbide mononitrate (IMDUR) 120 MG 24 hr tablet, Take 120 mg by mouth daily., Disp: , Rfl:    linaclotide (LINZESS) 145 MCG CAPS capsule, Take 1 capsule (145 mcg total) by mouth daily before breakfast., Disp: 30 capsule, Rfl: 11   loratadine (CLARITIN) 10 MG tablet, Take 10 mg by mouth in the morning., Disp: , Rfl:    Multiple Vitamin (MULTIVITAMIN ADULT) TABS, Take 1 tablet by mouth in the morning., Disp: , Rfl:    nitroGLYCERIN (NITROSTAT) 0.4 MG SL tablet, Place 1 tablet (0.4 mg total) under the tongue every 5 (five) minutes as needed for chest pain., Disp: 20 tablet, Rfl: 0   NOVOLOG FLEXPEN 100 UNIT/ML FlexPen, Inject 5 Units into the skin 3 (three) times daily with meals., Disp: , Rfl:    Omega-3 Fatty Acids (FISH OIL) 1200 MG CAPS, Take 1,200 mg by mouth in the morning., Disp: , Rfl:    pregabalin (LYRICA) 25 MG capsule, Take 25 mg by mouth 2 (two) times daily., Disp: , Rfl:    Semaglutide, 2 MG/DOSE, (OZEMPIC, 2 MG/DOSE,) 8 MG/3ML SOPN, Inject 2 mg into the skin once a week., Disp: 3 mL, Rfl: 6    sertraline (ZOLOFT) 50 MG tablet, Take 50 mg by mouth in the morning., Disp: , Rfl:    spironolactone (ALDACTONE) 50 MG tablet, Take 50 mg by mouth daily., Disp: , Rfl:    torsemide (DEMADEX) 20 MG tablet, Take 1 tablet (20 mg total) by mouth daily as needed. (Patient taking differently: Take 20 mg by mouth daily as needed. Patient takes 1 tablet daily), Disp: 90 tablet, Rfl: 3   TURMERIC CURCUMIN PO, Take 500 mg by mouth daily at 6 (six) AM., Disp: , Rfl:   Current Facility-Administered Medications:    sodium phosphate (FLEET) 7-19 GM/118ML enema 1 enema, 1 enema, Rectal, Once, Danis, Starr Lake III, MD  Facility-Administered Medications Ordered in Other Visits:    technetium pyrophosphate Tc 33m injection 21.2 millicurie, 21.2 millicurie, Intravenous, Once, Hilty, Lisette Abu, MD  Vital signs in last 24 hrs: There were no vitals filed for this visit. Wt Readings from Last 3 Encounters:  02/02/23 213 lb 6.4 oz (96.8 kg)  01/06/23 208 lb (94.3 kg)  01/04/23 208 lb (94.3 kg)    Physical Exam No exam.  Entire visit spent in review of records, symptoms and plan   Labs:   ___________________________________________ Radiologic studies:   ____________________________________________ Other: : Pathology results on file as noted above  _____________________________________________ Assessment & Plan  Assessment: Chronic constipation  Incontinence of feces, unspecified fecal incontinence type   Multifactorial constipation.  I believe much of it is from her markedly redundant colon anatomy, possibly side effect of iron tablets, and what sounds like some pelvic floor dysfunction.  She feels a bulge in the perineum and has to put pressure on that, but that does not always aid with bowel movements.  There is the possibility of rectocele. I am reluctant to increase the dose of Linzess or add other cathartic agents out of concern that she will develop loose stool, especially with some reported  fecal incontinence at times.  Plan: -Glycerin suppository when needed.  Start every other day if she has had no BM for 3 days (reports last BM was a week ago) -Advised to use a squatty potty or similar -refer to gynecology for full pelvic exam to evaluate the possibility of pelvic floor muscle weakness and/or rectocele -colonoscopy recall in July 2025 due to poor preparation on last exam.  I am hopeful we will have better control of her constipation at that point and bowel prep may be improved. -B12, folate, Iron panel for her normocytic anemia.  If she still has normal levels of these, then she needs additional workup of her anemia by primary care so she can stop taking iron tablets.  I asked her to stop the iron tablets now, we will communicate lab results to her and forward them to primary care.   23 minutes were spent on this encounter, and all of that time was spent on counseling and coordination of care.    I,Safa M Kadhim,acting as a scribe for Charlie Pitter III, MD.,have documented all relevant documentation on the behalf of Sherrilyn Rist, MD,as directed by  Sherrilyn Rist, MD while in the presence of Sherrilyn Rist, MD.   Marvis Repress III, MD, have reviewed all documentation for this visit. The documentation on 04/07/23 for the exam, diagnosis, procedures, and orders are all accurate and complete.

## 2023-04-07 NOTE — Progress Notes (Deleted)
[facility] Progress Note  Chief Complaint: Chronic constipation  Subjective  History: Maria Hudson is here for follow-up of constipation, having been seen in the office by our APP in July of this year.  Previously on Linzess 72 mcg daily with as needed MiraLAX, though the latter sometimes caused loose stool.  Linzess dose was increased to 145 daily.  Surveillance colonoscopy for history of subcentimeter adenomatous polyp at [facility] in 2017 was performed on 01/06/2023 by Dr. Adela Lank.  (Originally scheduled with me 2 days prior but prep was poor, so required additional bowel preparation and exam with another physician).  Unfortunately, bowel preparation remained poor in the exam by Dr. Adela Lank despite a large amount of lavage.  Multiple hyperplastic polyps removed. ______________________   ***  ROS: Cardiovascular:  no chest pain Respiratory: no dyspnea  The patient's Past Medical, Family and Social History were reviewed and are on file in the EMR.  Objective:  Med list reviewed  Current Outpatient Medications:    Accu-Chek Softclix Lancets lancets, 3 (three) times daily., Disp: , Rfl:    acetaminophen (TYLENOL) 500 MG tablet, Take 1,000 mg by mouth every 6 (six) hours as needed for moderate pain or headache., Disp: , Rfl:    allopurinol (ZYLOPRIM) 100 MG tablet, Take 100 mg by mouth in the morning., Disp: , Rfl:    amLODipine (NORVASC) 10 MG tablet, Take 10 mg by mouth daily., Disp: , Rfl:    aspirin EC 81 MG tablet, Take 81 mg by mouth in the morning. Swallow whole., Disp: , Rfl:    atorvastatin (LIPITOR) 40 MG tablet, Take 1 tablet (40 mg total) by mouth every evening., Disp: 90 tablet, Rfl: 3   buPROPion (WELLBUTRIN) 100 MG tablet, Take 100 mg by mouth 2 (two) times daily., Disp: , Rfl:    carvedilol (COREG) 25 MG tablet, Take 1 tablet (25 mg total) by mouth 2 (two) times daily with a meal., Disp: 60 tablet, Rfl: 0   cholecalciferol (VITAMIN D3) 25 MCG (1000 UNIT) tablet,  Take 2,000 Units by mouth daily., Disp: , Rfl:    cloNIDine (CATAPRES) 0.2 MG tablet, TAKE 2 TABLETS (0.4 MG TOTAL) BY MOUTH 3 (THREE) TIMES DAILY., Disp: 504 tablet, Rfl: 3   Continuous Blood Gluc Transmit (DEXCOM G6 TRANSMITTER) MISC, Use one every 3 month, Disp: , Rfl:    dapagliflozin propanediol (FARXIGA) 10 MG TABS tablet, Take 1 tablet (10 mg total) by mouth daily before breakfast., Disp: 30 tablet, Rfl: 11   ferrous sulfate 325 (65 FE) MG tablet, Take 325 mg by mouth 2 (two) times daily., Disp: , Rfl:    fluticasone (FLONASE) 50 MCG/ACT nasal spray, Place 1 spray into both nostrils daily as needed for allergies or rhinitis., Disp: , Rfl:    hydrALAZINE (APRESOLINE) 100 MG tablet, TAKE 1 TABLET BY MOUTH 3 TIMES DAILY., Disp: 270 tablet, Rfl: 2   insulin glargine (LANTUS) 100 unit/mL SOPN, Inject 10 Units into the skin at bedtime., Disp: , Rfl:    isosorbide mononitrate (IMDUR) 120 MG 24 hr tablet, Take 120 mg by mouth daily., Disp: , Rfl:    linaclotide (LINZESS) 145 MCG CAPS capsule, Take 1 capsule (145 mcg total) by mouth daily before breakfast., Disp: 30 capsule, Rfl: 11   loratadine (CLARITIN) 10 MG tablet, Take 10 mg by mouth in the morning., Disp: , Rfl:    Multiple Vitamin (MULTIVITAMIN ADULT) TABS, Take 1 tablet by mouth in the morning., Disp: , Rfl:    nitroGLYCERIN (NITROSTAT)  0.4 MG SL tablet, Place 1 tablet (0.4 mg total) under the tongue every 5 (five) minutes as needed for chest pain., Disp: 20 tablet, Rfl: 0   NOVOLOG FLEXPEN 100 UNIT/ML FlexPen, Inject 5 Units into the skin 3 (three) times daily with meals., Disp: , Rfl:    Omega-3 Fatty Acids (FISH OIL) 1200 MG CAPS, Take 1,200 mg by mouth in the morning., Disp: , Rfl:    pregabalin (LYRICA) 25 MG capsule, Take 25 mg by mouth 2 (two) times daily., Disp: , Rfl:    Semaglutide, 2 MG/DOSE, (OZEMPIC, 2 MG/DOSE,) 8 MG/3ML SOPN, Inject 2 mg into the skin once a week., Disp: 3 mL, Rfl: 6   sertraline (ZOLOFT) 50 MG tablet, Take 50  mg by mouth in the morning., Disp: , Rfl:    spironolactone (ALDACTONE) 50 MG tablet, Take 50 mg by mouth daily., Disp: , Rfl:    torsemide (DEMADEX) 20 MG tablet, Take 1 tablet (20 mg total) by mouth daily as needed. (Patient taking differently: Take 20 mg by mouth daily as needed. Patient takes 1 tablet daily), Disp: 90 tablet, Rfl: 3   TURMERIC CURCUMIN PO, Take 500 mg by mouth daily at 6 (six) AM., Disp: , Rfl:    cyclobenzaprine (FLEXERIL) 10 MG tablet, Take 1 tablet (10 mg total) by mouth at bedtime. (Patient not taking: Reported on 03/18/2023), Disp: 30 tablet, Rfl: 0  Current Facility-Administered Medications:    sodium phosphate (FLEET) 7-19 GM/118ML enema 1 enema, 1 enema, Rectal, Once, Danis, Maria Blower, MD  Facility-Administered Medications Ordered in Other Visits:    technetium pyrophosphate Tc 69m injection 21.2 millicurie, 21.2 millicurie, Intravenous, Once, Hilty, Maria Abu, MD   Vital signs in last 24 hrs: Vitals:   04/07/23 0823  BP: (!) 150/82  Pulse: 71   Wt Readings from Last 3 Encounters:  04/07/23 220 lb (99.8 kg)  02/02/23 213 lb 6.4 oz (96.8 kg)  01/06/23 208 lb (94.3 kg)    Physical Exam  *** HEENT: sclera anicteric, oral mucosa moist without lesions Neck: supple, no thyromegaly, JVD or lymphadenopathy Cardiac: ***,  no peripheral edema Pulm: clear to auscultation bilaterally, normal RR and effort noted Abdomen: soft, *** tenderness, with active bowel sounds. No guarding or palpable hepatosplenomegaly. Skin; warm and dry, no jaundice or rash  Labs:   ___________________________________________ Radiologic studies:   ____________________________________________ Other:   _____________________________________________ Assessment & Plan  Assessment: No diagnosis found.    Plan:   *** minutes were spent on this encounter (including chart review, history/exam, counseling/coordination of care, and documentation) > 50% of that time was spent on  counseling and coordination of care.   Maria Hudson

## 2023-04-12 ENCOUNTER — Ambulatory Visit: Payer: Medicaid Other | Admitting: Physical Therapy

## 2023-04-12 ENCOUNTER — Encounter: Payer: Self-pay | Admitting: Physical Therapy

## 2023-04-12 DIAGNOSIS — G8929 Other chronic pain: Secondary | ICD-10-CM | POA: Diagnosis not present

## 2023-04-12 DIAGNOSIS — R262 Difficulty in walking, not elsewhere classified: Secondary | ICD-10-CM | POA: Diagnosis not present

## 2023-04-12 DIAGNOSIS — M25512 Pain in left shoulder: Secondary | ICD-10-CM | POA: Diagnosis not present

## 2023-04-12 DIAGNOSIS — M5459 Other low back pain: Secondary | ICD-10-CM | POA: Diagnosis not present

## 2023-04-12 DIAGNOSIS — M62830 Muscle spasm of back: Secondary | ICD-10-CM | POA: Diagnosis not present

## 2023-04-12 DIAGNOSIS — M542 Cervicalgia: Secondary | ICD-10-CM | POA: Diagnosis not present

## 2023-04-12 NOTE — Therapy (Signed)
OUTPATIENT PHYSICAL THERAPY SHOULDER/CERVICAL TREATMENT   Patient Name: Maria Hudson MRN: [id number] DOB:1960-10-28, 61 y.o., female Today's Date: 04/12/2023  END OF SESSION:  PT End of Session - 04/12/23 0935     Visit Number 6    Number of Visits 17    Date for PT Re-Evaluation 05/13/23    Authorization Type  [facility]    Authorization Time Period Approved 10 visits 03/22/23-05/21/23    Authorization - Visit Number 5    Authorization - Number of Visits 10    PT Start Time 0934    PT Stop Time 1030    PT Time Calculation (min) 56 min               Past Medical History:  Diagnosis Date   Acute on chronic diastolic heart failure (HCC) 03/05/2021   Anemia    Arthritis    CAD (coronary artery disease)    CHF (congestive heart failure) (HCC)    CKD (chronic kidney disease) stage 4, GFR 15-29 ml/min (HCC) 03/05/2021   Colon polyps    CVA (cerebral vascular accident) (HCC)    Depression    Diabetes mellitus without complication (HCC)    type 2   Dyspnea    Hypertension    Hypertensive urgency 04/14/2021   Memory loss    mild   OSA (obstructive sleep apnea) 05/27/2021   uses CPAP 12-18 cm   Pneumonia    several times   Pure hypercholesterolemia 05/27/2021   Renal disorder    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   Stroke [facility])    Past Surgical History:  Procedure Laterality Date   ABDOMINAL HYSTERECTOMY     AV FISTULA PLACEMENT Left 12/09/2021   Procedure: LEFT ARTERIOVENOUS (AV) FISTULA CREATION;  Surgeon: Maeola Harman, MD;  Location: [facility] OR;  Service: Vascular;  Laterality: Left;   BIOPSY  09/08/2021   Procedure: BIOPSY;  Surgeon: Sherrilyn Rist, MD;  Location: [facility]COPY;  Service: Gastroenterology;;   CESAREAN SECTION     x 1   ESOPHAGOGASTRODUODENOSCOPY (EGD) WITH PROPOFOL N/A 09/08/2021   Procedure: ESOPHAGOGASTRODUODENOSCOPY (EGD) WITH PROPOFOL;  Surgeon: Sherrilyn Rist, MD;  Location: [facility];  Service: Gastroenterology;   Laterality: N/A;  chest pain, epigastric pain   INNER EAR SURGERY Bilateral    RIGHT HEART CATH N/A 05/14/2021   Procedure: RIGHT HEART CATH;  Surgeon: Laurey Morale, MD;  Location: [facility] INVASIVE CV LAB;  Service: Cardiovascular;  Laterality: N/A;   TONSILLECTOMY     age 35   Patient Active Problem List   Diagnosis Date Noted   History of colonic polyps 12/28/2022   Chronic constipation 03/31/2022   Pain due to onychomycosis of toenails of both feet 12/05/2021   Pure hypercholesterolemia 05/27/2021   Chronic diastolic CHF (congestive heart failure) (HCC) 04/29/2021   Hypertensive urgency 04/14/2021   Acute on chronic diastolic CHF (congestive heart failure) (HCC) 04/14/2021   Chest pain 03/05/2021   CAD (coronary artery disease) 03/05/2021   T2DM (type 2 diabetes mellitus) (HCC) 03/05/2021   CKD (chronic kidney disease) stage 4, GFR 15-29 ml/min (HCC) 03/05/2021   Class 3 obesity 03/05/2021   CVA (cerebral vascular accident) (HCC) 03/05/2021   Acute on chronic diastolic heart failure (HCC) 03/05/2021   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   AKI (acute kidney injury) (HCC) 03/05/2021   Vitreous hemorrhage of left eye (HCC) 11/14/2020   Orthopnea 08/06/2020   Vision loss of right eye 08/06/2020   Excessive daytime sleepiness 01/11/2020  Iron deficiency anemia 12/14/2019   Vitreous hemorrhage, right (HCC) 12/04/2019   Diabetic neuropathy (HCC) 07/26/2019   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) 02/03/2019   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   Conductive hearing loss, bilateral 03/03/2018   Chronic bilateral low back pain without sciatica 08/21/2015   Chronic kidney disease (CKD) stage G3b/A3, moderately decreased glomerular filtration rate (GFR) between 30-44 mL/min/1.73 square meter and albuminuria creatinine ratio greater than 300 mg/g (HCC) 04/30/2015   Type 2 diabetes mellitus with diabetic  polyneuropathy, with long-term current use of insulin (HCC) 02/15/2015   Advance directive on file 05/17/2014   Refusal of blood transfusions as patient is Jehovah's Witness 05/17/2014   OSA treated with BiPAP 02/23/2014   Coronary artery disease 07/07/2013   Severe obesity (BMI >= 40) (HCC) 05/18/2013   Neuropathy 04/11/2013   Diastolic heart failure (HCC) 10/16/2012   Essential hypertension 10/16/2012    PCP: Fleet Contras MD   REFERRING PROVIDER: Dr. Geraldine Solar, Allayne Gitelman, PA-C  REFERRING DIAG:  [phone] (ICD-10-CM) - Chronic left shoulder pain  M54.2 (ICD-10-CM) - Neck pain    THERAPY DIAG:  Cervicalgia  Chronic left shoulder pain  Rationale for Evaluation and Treatment: Rehabilitation  ONSET DATE: chronic   SUBJECTIVE:      Today we are at a 4/10 in the neck. Upper back 7/10. Also left wrist hurts from using the therabands.                                               SUBJECTIVE STATEMENT: Pt has had chronic L sided neck and shoulder pain, since 1996. She was working at [employer] and she slipped and fell a few times within a few days.  She has had pain since then.   She was not treated at that time. The doctor said it is likely coming from my neck. She had XR but no MRI.  Dr. Magnus Ivan injected her shoulder yesterday and no change as of today.   She reports difficulty walking, turning her head, using her L arm for home tasks and ADLs. "My whole L side is weaker"  Reports N/T L hand  Weaker in L UE  She also complains of bilateral hip and back pain, for which she is not being treated.  Does have issues with balance but not sure why.  She did have a stroke but reports that only affected her memory.  She is not sure if she has Neuropathy.      Hand dominance: Right  PERTINENT HISTORY: Diabetes, CVA, back pain     PAIN: 03/26/23 Are you having pain? Yes: NPRS scale: 4/10 Pain location: L neck  Pain description: tight, stiff Aggravating  factors: sleeping on L side, activity  Relieving factors: Tylenol, hot bath Headache often   PRECAUTIONS: None  RED FLAGS: None   WEIGHT BEARING RESTRICTIONS: No  FALLS:  Has patient fallen in last 6 months? No   LIVING ENVIRONMENT: Lives with: lives with their family Lives in: House/apartment Stairs: Yes: Internal: 12 steps; on left going up Has following equipment at home: Single point cane and Walker - 2 wheeled Used walker about a month (4 wheeled)   OCCUPATION: Not working right now "Little bit if everything"  PLOF: Independent, Independent with household mobility without device, Independent with community mobility  with device, Needs assistance with ADLs, Leisure: family time, TV, cards and games, and not exercising.  I used to love to exercise.  She used to do at home on her own.   Pt does not have a bed.  Sleeps on a couch.  Walking is painful hips, low back.    PATIENT GOALS: I want to get out of pain.    NEXT MD VISIT:   OBJECTIVE:  Note: Objective measures were completed at Evaluation unless otherwise noted.  DIAGNOSTIC FINDINGS:  Left shoulder 3 views: Shoulder is well located.  Subacromial space  well-maintained.  Mild AC joint arthritic changes.  Glenohumeral joints  well-maintained.  No acute fractures bony abnormalities.  Cervical spine 2 views: No acute fracture.  Loss of lordotic curvature.   Diminished disc space C6 -C7.  No spondylolisthesis.  No acute fractures.   No other bony abnormalities.  PATIENT SURVEYS:  FOTO 43% neck, 43% shoulder   COGNITION: Overall cognitive status: History of cognitive impairments - at baseline memory per pt.      SENSATION: WFL   POSTURE: Forward head, somewhat rounded upper back   UPPER EXTREMITY ROM:  Active ROM Right eval Left eval Left 04/07/23  Shoulder flexion [facility]  WFL pain  WFL Pain  Shoulder extension     Shoulder abduction WFL  120 pain  150 pain  Shoulder adduction     Shoulder internal  rotation WFL Min pain reaches to low back  Mid thoracic No pain   Shoulder external rotation WFL  Pain , limited but NT T2 pain  Elbow flexion     Elbow extension     Wrist flexion     Wrist extension     Wrist ulnar deviation     Wrist radial deviation     Wrist pronation     Wrist supination     (Blank rows = not tested)   CERVICAL AROM  EVAL    flexion 30 deg    extension 50 deg    L SB  25    R SB  40    L rotation 25    R rotation  WNL          UPPER EXTREMITY MMT:  MMT Right eval Left eval  Shoulder flexion 4 4-  Shoulder extension    Shoulder abduction 4 4-  Shoulder adduction    Shoulder internal rotation NT 4  Shoulder external rotation NT  4  Middle trapezius    Lower trapezius    Elbow flexion  5  Elbow extension  4  Wrist flexion    Wrist extension    Wrist ulnar deviation    Wrist radial deviation    Wrist pronation    Wrist supination    Grip strength (lbs)    (Blank rows = not tested)  FUNCTIONAL TESTS 03/26/23: 2 MWT 344 feet (LBP/Hip pain 4/10 increased to  7/10) 03/26/23: 14.1 sec  no AD, staggers with turn to sit , corrects with reaching strategy  03/26/23: BERG Balance: 52/56  SHOULDER SPECIAL TESTS: NT  JOINT MOBILITY TESTING:  Unable to tolerate in cervical spine No stiffness in L GH joint   PALPATION:  Pain and spasm L upper trap   TODAY'S TREATMENT:   [facility] Adult PT Treatment:  DATE: 04/12/23 Therapeutic Exercise: Seated chin tuck Seated scap retract Seated shoulder rolls  Standing back to wall with chin tuck: AROM for horiz abdct , Goal post , diagonals x 10 each (no resiatnce bands today due to new onset of left wrist pain STS x 5 - c/o knee pain / popping  Therapeutic Activity: tandem 17 sec  LLE back  tandem 13 sec RLE back SLS 4 sec L and R Side stepping at counter red band x 6 passes Manual Therapy: Cervical distraction  Passive cervical rotation , upper trap  stretch  Modalities:  HMP to cervical x 10 minutes     [facility] Adult PT Treatment:                                                DATE: 04/07/23 Therapeutic Exercise: Upper trap and levator stretch  Shoulder rolls  Seated scap retract  Seated chin tuck  Row Red 5 sec x 10 Standing shoulder flexion pullovers with dowel x 8 Standing ER with scap retract  YTB 10 x 2 , min pain L shoulder Doorway stretch x 2 for 30 sec  Open books standified for turning left due to increased pain.  Supine yellow band horix  Supine alt diagonals yellow Supine chin tuck towel roll Supine chin tuck with towel roll 5 sec x 10 DNF 3 sec x 5   Manual Therapy: Cervical distraction  Passive cervical rotation     [facility] Adult PT Treatment:                                                DATE: 04/05/23 Therapeutic Exercise: Cervical stretches, review and perform x 3 each side: upper trap, rotation Supine small ROM rotation with towel under neck  Supine chin tuck x 5 sec x 10  Scapular retraction x 10   Chin tuck with slight rotation x 5 each side  Supine chin tuck with shoulder flexion with dowel  Supine horizontal abduction and narrow grip flexion yellow 2 x 10  ER Yellow band 2 x 15 , min pain L shoulder Standing against wall for shoulder flexion  Standing open book  x 5 offered supine as well  Manual Therapy: NA  Self Care: HEP techniques and streamlining for routine    [facility] Adult PT Treatment:                                                DATE: 04/02/23 Therapeutic Exercise: Supine chin tuck x10 3" Supine DNF lift offs x10 5" Seated Scapular Retraction 10 reps - 5 hold Seated Chin Tuck with Neck Elongation 10 reps - 5 hold Seated R Cervical Sidebending Stretch 3 reps - 20 hold Seated R Cervical Rotation with Nod 3 reps - 20 hold Manual Therapy: Skilled palpation to the L upper trap to identify TrPs and taut muscle bands  STM to the upper trap, levator, and cervical paraspinals Cervical  tractions Trigger Point Dry Needling Treatment: Pre-treatment instruction: Patient instructed on dry needling rationale, procedures, and possible side effects including pain during treatment (achy,cramping feeling), bruising, drop  of blood, lightheadedness, nausea, sweating. Patient Consent Given: Yes Education handout provided: Yes Muscles treated: L upper trap  Needle size and number: .30x78mm x 1 Electrical stimulation performed: No Parameters: N/A Treatment response/outcome: Twitch response elicited Post-treatment instructions: Patient instructed to expect possible mild to moderate muscle soreness later today and/or tomorrow. Patient instructed in methods to reduce muscle soreness and to continue prescribed HEP. If patient was dry needled over the lung field, patient was instructed on signs and symptoms of pneumothorax and, however unlikely, to see immediate medical attention should they occur. Patient was also educated on signs and symptoms of infection and to seek medical attention should they occur. Patient verbalized understanding of these instructions and education.                                                                                                                                         [facility] Adult PT Treatment:                                                DATE: 03/26/23 Therapeutic Exercise: Nustep L4 LE x 6 minutes only due to increased neck pain in first minute Therapeutic Activity: BERG BALANCE TEST Sitting to Standing: 4.      Stands without using hands and stabilize independently Standing Unsupported: 4.      Stands safely for 2 minutes Sitting Unsupported: 4.     Sits for 2 minutes independently Standing to Sitting: 4.     Sits safely with minimal use of hands Transfers: 4.     Transfers safely with minor use of hands Standing with eyes closed: 4.     Stands safely for 10 seconds  Standing with feet together: 4.     Stands for 1 minute safely Reaching forward  with outstretched arm: 4.     Reaches forward 10 inches Retrieving object from the floor: 4.      Able to pick up easily and safely Turning to look behind: 4.     Looks behind from both sides and weight shifts well Turning 360 degrees: 4.     Able to turn in </=4 seconds  Place alternate foot on stool: 4.     Completes 8 steps in 20 seconds     Standing with one foot in front: 3.     Independent foot ahead for 30 seconds Standing on one foot: 1.     Holds <3 seconds Total Score: 52/56  2 MWT 344 feet without AD  TUG: 14.1 sec without AD, stagger with turn to sit, correct with reaching strategy Modalities: HMP to lumbar and cervical x 15 minutes end of session   DATE:  03/18/23 PT eval  HEP Manual therapy to cervical spine with light suboccipitals and  distraction     PATIENT EDUCATION: Education details: PT, POC, HEP  Person educated: Patient Education method: Programmer, multimedia, Demonstration, Verbal cues, and Handouts Education comprehension: verbalized understanding, returned demonstration, and needs further education  HOME EXERCISE PROGRAM: Access Code: [email or web address] URL: [email or web address] Date: 04/05/2023 Prepared by: Karie Mainland  Exercises - Seated Scapular Retraction  - 2 x daily - 7 x weekly - 2 sets - 10 reps - 5 hold - Seated Chin Tuck with Neck Elongation  - 2 x daily - 7 x weekly - 2 sets - 10 reps - 5 hold - Seated Cervical Sidebending Stretch  - 2 x daily - 7 x weekly - 1 sets - 3-5 reps - 20-30 hold - Seated Cervical Rotation with Nod  - 1 x daily - 7 x weekly - 1 sets - 3-5 reps - 20-30 hold - Supine Cervical Retraction with Towel  - 2 x daily - 7 x weekly - 1 sets - 10 reps - 3 hold - Supine DNF Liftoffs  - 2 x daily - 7 x weekly - 1 sets - 10 reps - 5 hold - Standing Thoracic Open Book at Wall  - 1 x daily - 7 x weekly - 2 sets - 10 reps - 5-10 hold - Standing Shoulder Flexion AAROM with Dowel  - 1 x daily - 7 x weekly - 2 sets - 10 reps - 5 hold -  Shoulder External Rotation and Scapular Retraction with Resistance  - 1 x daily - 7 x weekly - 2 sets - 10 reps - 5 hold - Standing Shoulder Horizontal Abduction with Resistance  - 1 x daily - 7 x weekly - 2 sets - 10 reps - 5 hold   ASSESSMENT:  CLINICAL IMPRESSION: Pt reports increased wrist Pain she attributes to using the resistance bands. Continued with scapular and neck stabilization without resistance bancs today. SLS time slightly improved, worked on Hospital doctor and LE strength. Pt has not climbed her stairs in 4 months but plans to do so later today for laundry.     OBJECTIVE IMPAIRMENTS: Abnormal gait, decreased activity tolerance, decreased balance, decreased coordination, decreased mobility, difficulty walking, decreased ROM, decreased strength, increased fascial restrictions, increased muscle spasms, impaired flexibility, impaired UE functional use, postural dysfunction, obesity, and pain.   ACTIVITY LIMITATIONS: carrying, lifting, bending, standing, squatting, sleeping, stairs, transfers, bed mobility, dressing, reach over head, and locomotion level  PARTICIPATION LIMITATIONS: meal prep, cleaning, laundry, interpersonal relationship, shopping, and community activity  PERSONAL FACTORS: Past/current experiences, Time since onset of injury/illness/exacerbation, and 3+ comorbidities: CVA, chronic pain, diabetes   are also affecting patient's functional outcome.   REHAB POTENTIAL: Good  CLINICAL DECISION MAKING: Evolving/moderate complexity  EVALUATION COMPLEXITY: Moderate   GOALS: Goals reviewed with patient? Yes  SHORT TERM GOALS: Target date: 04/15/2023    Pt will be I with cervical AROM and posture HEP Baseline unknown Goal status: MET  2.  Pt will be able to improve AROM of L shoulder to WNL with min increase in shoulder pain  Baseline: limited in functional reach 04/07/23: WNL however pain   Goal status: ongoing   3.  Patient will be screened for  balance/fall risk and goal set Baseline: NT on eval  Goal status: MET   LONG TERM GOALS: Target date: 05/13/2023    Patient will be independent with home exercise program upon discharge Baseline: unknown  Goal status: INITIAL  2.  Patient will demo cervical active range of motion without increased pain  Baseline:  limited in rotation, extension and side bending  04/07/23: continued visual limitation esp left sb and left rotation 04/12/23: pain still present with side bending , others are less painful  Goal status: ONGOING  3.  Patient will report min increase in pain with ADLs and light housework Baseline: mod to severe  04/12/23: Mostly moderate , has been avoiding the stairs, plans to go up today for laundry  Goal status: ONGOING  4.  Patient will be able to walk up her stairs with min increase in pain in order to do laundry Baseline: avoid this due to hip pain  04/12/23: will climb today and see response , has avoided for 4 months Goal status: ONGOING  5.  Patient will participate in short, daily walking program in order to improve overall health and mobility. Baseline: does not do this  04/07/23: walk in store nearly daily Goal status: ONGOING  6.  Patient will score < 12 sec on TUG without LOB indicating decreased fall risk.  Baseline: 14 sec Goal status: NEW   PLAN:  PT FREQUENCY: 2x/week  PT DURATION: 8 weeks  PLANNED INTERVENTIONS: Therapeutic exercises, Therapeutic activity, Neuromuscular re-education, Balance training, Gait training, Patient/Family education, Self Care, Joint mobilization, DME instructions, Aquatic Therapy, Dry Needling, Electrical stimulation, Spinal mobilization, Cryotherapy, Moist heat, Taping, Ionotophoresis 4mg /ml Dexamethasone, Manual therapy, and Re-evaluation  PLAN FOR NEXT SESSION: check HEP, manual therapy, Cervical traction, modalities, TPDN, work on balance goals, steps, etc, see goals  ,  Jannette Spanner, PTA 04/12/23 12:49  PM Phone: [phone] Fax: [phone]

## 2023-04-13 NOTE — Therapy (Signed)
OUTPATIENT PHYSICAL THERAPY SHOULDER/CERVICAL TREATMENT   Patient Name: Maria Hudson MRN: [id number] DOB:03-13-1961, 62 y.o., female Today's Date: 04/14/2023  END OF SESSION:  PT End of Session - 04/14/23 0935     Visit Number 7    Number of Visits 17    Date for PT Re-Evaluation 05/13/23    Authorization Type [facility]    Authorization Time Period Approved 10 visits 03/22/23-05/21/23    Authorization - Visit Number 6    Authorization - Number of Visits 10    PT Start Time 0934    PT Stop Time 1020    PT Time Calculation (min) 46 min    Activity Tolerance Patient tolerated treatment well    Behavior During Therapy [facility] for tasks assessed/performed                Past Medical History:  Diagnosis Date   Acute on chronic diastolic heart failure (HCC) 03/05/2021   Anemia    Arthritis    CAD (coronary artery disease)    CHF (congestive heart failure) (HCC)    CKD (chronic kidney disease) stage 4, GFR 15-29 ml/min (HCC) 03/05/2021   Colon polyps    CVA (cerebral vascular accident) (HCC)    Depression    Diabetes mellitus without complication (HCC)    type 2   Dyspnea    Hypertension    Hypertensive urgency 04/14/2021   Memory loss    mild   OSA (obstructive sleep apnea) 05/27/2021   uses CPAP 12-18 cm   Pneumonia    several times   Pure hypercholesterolemia 05/27/2021   Renal disorder    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   Stroke [facility])    Past Surgical History:  Procedure Laterality Date   ABDOMINAL HYSTERECTOMY     AV FISTULA PLACEMENT Left 12/09/2021   Procedure: LEFT ARTERIOVENOUS (AV) FISTULA CREATION;  Surgeon: Maeola Harman, MD;  Location: [facility] OR;  Service: Vascular;  Laterality: Left;   BIOPSY  09/08/2021   Procedure: BIOPSY;  Surgeon: Sherrilyn Rist, MD;  Location: [facility] ENDOSCOPY;  Service: Gastroenterology;;   CESAREAN SECTION     x 1   ESOPHAGOGASTRODUODENOSCOPY (EGD) WITH PROPOFOL N/A 09/08/2021   Procedure:  ESOPHAGOGASTRODUODENOSCOPY (EGD) WITH PROPOFOL;  Surgeon: Sherrilyn Rist, MD;  Location: [facility] ENDOSCOPY;  Service: Gastroenterology;  Laterality: N/A;  chest pain, epigastric pain   INNER EAR SURGERY Bilateral    RIGHT HEART CATH N/A 05/14/2021   Procedure: RIGHT HEART CATH;  Surgeon: Laurey Morale, MD;  Location: [facility] INVASIVE CV LAB;  Service: Cardiovascular;  Laterality: N/A;   TONSILLECTOMY     age 2   Patient Active Problem List   Diagnosis Date Noted   History of colonic polyps 12/28/2022   Chronic constipation 03/31/2022   Pain due to onychomycosis of toenails of both feet 12/05/2021   Pure hypercholesterolemia 05/27/2021   Chronic diastolic CHF (congestive heart failure) (HCC) 04/29/2021   Hypertensive urgency 04/14/2021   Acute on chronic diastolic CHF (congestive heart failure) (HCC) 04/14/2021   Chest pain 03/05/2021   CAD (coronary artery disease) 03/05/2021   T2DM (type 2 diabetes mellitus) (HCC) 03/05/2021   CKD (chronic kidney disease) stage 4, GFR 15-29 ml/min (HCC) 03/05/2021   Class 3 obesity 03/05/2021   CVA (cerebral vascular accident) (HCC) 03/05/2021   Acute on chronic diastolic heart failure (HCC) 03/05/2021   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   AKI (acute kidney injury) (HCC) 03/05/2021   Vitreous hemorrhage of left eye (HCC) 11/14/2020  Orthopnea 08/06/2020   Vision loss of right eye 08/06/2020   Excessive daytime sleepiness 01/11/2020   Iron deficiency anemia 12/14/2019   Vitreous hemorrhage, right (HCC) 12/04/2019   Diabetic neuropathy (HCC) 07/26/2019   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) 02/03/2019   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   Conductive hearing loss, bilateral 03/03/2018   Chronic bilateral low back pain without sciatica 08/21/2015   Chronic kidney disease (CKD) stage G3b/A3, moderately decreased glomerular filtration rate (GFR) between 30-44 mL/min/1.73  square meter and albuminuria creatinine ratio greater than 300 mg/g (HCC) 04/30/2015   Type 2 diabetes mellitus with diabetic polyneuropathy, with long-term current use of insulin (HCC) 02/15/2015   Advance directive on file 05/17/2014   Refusal of blood transfusions as patient is Jehovah's Witness 05/17/2014   OSA treated with BiPAP 02/23/2014   Coronary artery disease 07/07/2013   Severe obesity (BMI >= 40) (HCC) 05/18/2013   Neuropathy 04/11/2013   Diastolic heart failure (HCC) 10/16/2012   Essential hypertension 10/16/2012    PCP: Fleet Contras MD   REFERRING PROVIDER: Dr. Geraldine Solar, Allayne Gitelman, PA-C  REFERRING DIAG:  [phone] (ICD-10-CM) - Chronic left shoulder pain  M54.2 (ICD-10-CM) - Neck pain    THERAPY DIAG:  Cervicalgia  Chronic left shoulder pain  Cramp and spasm  Rationale for Evaluation and Treatment: Rehabilitation  ONSET DATE: chronic   SUBJECTIVE:     Pt reports her neck is better, but her upper shoulders and upper back continue to bother her. Today we are at a 3/10 in the neck. Upper back 7/10. Also left wrist hurts from using the therabands.                                               SUBJECTIVE STATEMENT: Pt has had chronic L sided neck and shoulder pain, since 1996. She was working at [employer] and she slipped and fell a few times within a few days.  She has had pain since then.   She was not treated at that time. The doctor said it is likely coming from my neck. She had XR but no MRI.  Dr. Magnus Ivan injected her shoulder yesterday and no change as of today.   She reports difficulty walking, turning her head, using her L arm for home tasks and ADLs. "My whole L side is weaker"  Reports N/T L hand  Weaker in L UE  She also complains of bilateral hip and back pain, for which she is not being treated.  Does have issues with balance but not sure why.  She did have a stroke but reports that only affected her memory.  She is not sure if  she has Neuropathy.      Hand dominance: Right  PERTINENT HISTORY: Diabetes, CVA, back pain     PAIN: 03/26/23 Are you having pain? Yes: NPRS scale: 3/10 Pain location: L neck  Pain description: tight, stiff Aggravating factors: sleeping on L side, activity  Relieving factors: Tylenol, hot bath Headache often   PRECAUTIONS: None  RED FLAGS: None   WEIGHT BEARING RESTRICTIONS: No  FALLS:  Has patient fallen in last 6 months? No   LIVING ENVIRONMENT: Lives with: lives with their family Lives in: House/apartment Stairs: Yes: Internal: 12 steps; on left going up Has following equipment at home: Single  point cane and Walker - 2 wheeled Used walker about a month (4 wheeled)   OCCUPATION: Not working right now "Little bit if everything"  PLOF: Independent, Independent with household mobility without device, Independent with community mobility with device, Needs assistance with ADLs, Leisure: family time, TV, cards and games, and not exercising.  I used to love to exercise.  She used to do at home on her own.   Pt does not have a bed.  Sleeps on a couch.  Walking is painful hips, low back.    PATIENT GOALS: I want to get out of pain.    NEXT MD VISIT:   OBJECTIVE:  Note: Objective measures were completed at Evaluation unless otherwise noted.  DIAGNOSTIC FINDINGS:  Left shoulder 3 views: Shoulder is well located.  Subacromial space  well-maintained.  Mild AC joint arthritic changes.  Glenohumeral joints  well-maintained.  No acute fractures bony abnormalities.  Cervical spine 2 views: No acute fracture.  Loss of lordotic curvature.   Diminished disc space C6 -C7.  No spondylolisthesis.  No acute fractures.   No other bony abnormalities.  PATIENT SURVEYS:  FOTO 43% neck, 43% shoulder   COGNITION: Overall cognitive status: History of cognitive impairments - at baseline memory per pt.      SENSATION: WFL   POSTURE: Forward head, somewhat rounded upper back    UPPER EXTREMITY ROM:  Active ROM Right eval Left eval Left 04/07/23  Shoulder flexion [facility]  WFL pain  WFL Pain  Shoulder extension     Shoulder abduction WFL  120 pain  150 pain  Shoulder adduction     Shoulder internal rotation WFL Min pain reaches to low back  Mid thoracic No pain   Shoulder external rotation WFL  Pain , limited but NT T2 pain  Elbow flexion     Elbow extension     Wrist flexion     Wrist extension     Wrist ulnar deviation     Wrist radial deviation     Wrist pronation     Wrist supination     (Blank rows = not tested)   CERVICAL AROM  EVAL 04/14/23   flexion 30 deg    extension 50 deg    L SB  25 35   R SB  40    L rotation 25 58   R rotation  WNL       UPPER EXTREMITY MMT:  MMT Right eval Left eval  Shoulder flexion 4 4-  Shoulder extension    Shoulder abduction 4 4-  Shoulder adduction    Shoulder internal rotation NT 4  Shoulder external rotation NT  4  Middle trapezius    Lower trapezius    Elbow flexion  5  Elbow extension  4  Wrist flexion    Wrist extension    Wrist ulnar deviation    Wrist radial deviation    Wrist pronation    Wrist supination    Grip strength (lbs)    (Blank rows = not tested)  FUNCTIONAL TESTS 03/26/23: 2 MWT 344 feet (LBP/Hip pain 4/10 increased to  7/10) 03/26/23: 14.1 sec  no AD, staggers with turn to sit , corrects with reaching strategy  03/26/23: BERG Balance: 52/56  SHOULDER SPECIAL TESTS: NT  JOINT MOBILITY TESTING:  Unable to tolerate in cervical spine No stiffness in L GH joint   PALPATION:  Pain and spasm L upper trap   TODAY'S TREATMENT:   [facility] Adult PT Treatment:  DATE: 04/14/23 Therapeutic Exercise: Supine chin tuck x10 3" Supine DNF lift offs x10 5" Supine shoulder abd star pattern 2x5 RTB Supine bilat ER 2x10 RTB Seated R Cervical Sidebending Stretch 3 reps - 20 hold Seated R Cervical Rotation with Nod 3 reps - 20  hold Manual Therapy: Skilled palpation to the L upper shoulder and mid back to identify TrPs and taut muscle bands  STM to the upper trap, levator, and mid back paraspinals and rhomboids Trigger Point Dry Needling Treatment: Pre-treatment instruction: Patient instructed on dry needling rationale, procedures, and possible side effects including pain during treatment (achy,cramping feeling), bruising, drop of blood, lightheadedness, nausea, sweating. Patient Consent Given: Yes Education handout provided: Yes Muscles treated: Bilat upper trap, mid back paraspinals, and rhomboids Needle size and number: .30x48mm x 2 Electrical stimulation performed: No Parameters: N/A Treatment response/outcome: Twitch response elicited Post-treatment instructions: Patient instructed to expect possible mild to moderate muscle soreness later today and/or tomorrow. Patient instructed in methods to reduce muscle soreness and to continue prescribed HEP. If patient was dry needled over the lung field, patient was instructed on signs and symptoms of pneumothorax and, however unlikely, to see immediate medical attention should they occur. Patient was also educated on signs and symptoms of infection and to seek medical attention should they occur. Patient verbalized understanding of these instructions and education.   [facility] Adult PT Treatment:                                                DATE: 04/12/23 Therapeutic Exercise: Seated chin tuck Seated scap retract Seated shoulder rolls  Standing back to wall with chin tuck: AROM for horiz abdct , Goal post , diagonals x 10 each (no resiatnce bands today due to new onset of left wrist pain STS x 5 - c/o knee pain / popping  Therapeutic Activity: tandem 17 sec  LLE back  tandem 13 sec RLE back SLS 4 sec L and R Side stepping at counter red band x 6 passes Manual Therapy: Cervical distraction  Passive cervical rotation , upper trap stretch  Modalities:  HMP to cervical  x 10 minutes     [facility] Adult PT Treatment:                                                DATE: 04/07/23 Therapeutic Exercise: Upper trap and levator stretch  Shoulder rolls  Seated scap retract  Seated chin tuck  Row Red 5 sec x 10 Standing shoulder flexion pullovers with dowel x 8 Standing ER with scap retract  YTB 10 x 2 , min pain L shoulder Doorway stretch x 2 for 30 sec  Open books standified for turning left due to increased pain.  Supine yellow band horix  Supine alt diagonals yellow Supine chin tuck towel roll Supine chin tuck with towel roll 5 sec x 10 DNF 3 sec x 5   Manual Therapy: Cervical distraction  Passive cervical rotation     [facility] Adult PT Treatment:  DATE: 04/05/23 Therapeutic Exercise: Cervical stretches, review and perform x 3 each side: upper trap, rotation Supine small ROM rotation with towel under neck  Supine chin tuck x 5 sec x 10  Scapular retraction x 10   Chin tuck with slight rotation x 5 each side  Supine chin tuck with shoulder flexion with dowel  Supine horizontal abduction and narrow grip flexion yellow 2 x 10  ER Yellow band 2 x 15 , min pain L shoulder Standing against wall for shoulder flexion  Standing open book  x 5 offered supine as well  Manual Therapy: NA  Self Care: HEP techniques and streamlining for routine    PATIENT EDUCATION: Education details: PT, POC, HEP  Person educated: Patient Education method: Programmer, multimedia, Demonstration, Verbal cues, and Handouts Education comprehension: verbalized understanding, returned demonstration, and needs further education  HOME EXERCISE PROGRAM: Access Code: [email or web address] URL: [email or web address] Date: 04/05/2023 Prepared by: Karie Mainland  Exercises - Seated Scapular Retraction  - 2 x daily - 7 x weekly - 2 sets - 10 reps - 5 hold - Seated Chin Tuck with Neck Elongation  - 2 x daily - 7 x weekly - 2 sets - 10 reps - 5  hold - Seated Cervical Sidebending Stretch  - 2 x daily - 7 x weekly - 1 sets - 3-5 reps - 20-30 hold - Seated Cervical Rotation with Nod  - 1 x daily - 7 x weekly - 1 sets - 3-5 reps - 20-30 hold - Supine Cervical Retraction with Towel  - 2 x daily - 7 x weekly - 1 sets - 10 reps - 3 hold - Supine DNF Liftoffs  - 2 x daily - 7 x weekly - 1 sets - 10 reps - 5 hold - Standing Thoracic Open Book at Wall  - 1 x daily - 7 x weekly - 2 sets - 10 reps - 5-10 hold - Standing Shoulder Flexion AAROM with Dowel  - 1 x daily - 7 x weekly - 2 sets - 10 reps - 5 hold - Shoulder External Rotation and Scapular Retraction with Resistance  - 1 x daily - 7 x weekly - 2 sets - 10 reps - 5 hold - Standing Shoulder Horizontal Abduction with Resistance  - 1 x daily - 7 x weekly - 2 sets - 10 reps - 5 hold   ASSESSMENT:  CLINICAL IMPRESSION: PT focused on muscular pain of the upper shoulders and mid back. STM was f/b TPDN of the bilat upper traps, mid back paraspinals and rhomboids. Therex for postural and posterior chain strengthening and cervical flexibility were then completed for muscle activation. Overall, pt's neck pain is improving as well as her cervical ROM for L SB and rot which have been significantly limited. Pt tolerated PT today without adverse effects. Pt will continue to benefit from skilled PT to address impairments for improved neck function with less pain.    OBJECTIVE IMPAIRMENTS: Abnormal gait, decreased activity tolerance, decreased balance, decreased coordination, decreased mobility, difficulty walking, decreased ROM, decreased strength, increased fascial restrictions, increased muscle spasms, impaired flexibility, impaired UE functional use, postural dysfunction, obesity, and pain.   ACTIVITY LIMITATIONS: carrying, lifting, bending, standing, squatting, sleeping, stairs, transfers, bed mobility, dressing, reach over head, and locomotion level  PARTICIPATION LIMITATIONS: meal prep, cleaning,  laundry, interpersonal relationship, shopping, and community activity  PERSONAL FACTORS: Past/current experiences, Time since onset of injury/illness/exacerbation, and 3+ comorbidities: CVA, chronic pain, diabetes   are also affecting patient's functional outcome.  REHAB POTENTIAL: Good  CLINICAL DECISION MAKING: Evolving/moderate complexity  EVALUATION COMPLEXITY: Moderate   GOALS: Goals reviewed with patient? Yes  SHORT TERM GOALS: Target date: 04/15/2023    Pt will be I with cervical AROM and posture HEP Baseline unknown Goal status: MET  2.  Pt will be able to improve AROM of L shoulder to WNL with min increase in shoulder pain  Baseline: limited in functional reach 04/07/23: WNL however pain   Goal status: ongoing   3.  Patient will be screened for balance/fall risk and goal set Baseline: NT on eval  Goal status: MET   LONG TERM GOALS: Target date: 05/13/2023    Patient will be independent with home exercise program upon discharge Baseline: unknown  Goal status: INITIAL  2.  Patient will demo cervical active range of motion without increased pain  Baseline: limited in rotation, extension and side bending  04/07/23: continued visual limitation esp left sb and left rotation 04/12/23: pain still present with side bending , others are less painful 04/14/23: ROM for L SB and rot are improved as well as concordant pain Goal status: ONGOING  3.  Patient will report min increase in pain with ADLs and light housework Baseline: mod to severe  04/12/23: Mostly moderate , has been avoiding the stairs, plans to go up today for laundry  Goal status: ONGOING  4.  Patient will be able to walk up her stairs with min increase in pain in order to do laundry Baseline: avoid this due to hip pain  04/12/23: will climb today and see response , has avoided for 4 months Goal status: ONGOING  5.  Patient will participate in short, daily walking program in order to improve overall  health and mobility. Baseline: does not do this  04/07/23: walk in store nearly daily Goal status: ONGOING  6.  Patient will score < 12 sec on TUG without LOB indicating decreased fall risk.  Baseline: 14 sec Goal status: NEW   PLAN:  PT FREQUENCY: 2x/week  PT DURATION: 8 weeks  PLANNED INTERVENTIONS: Therapeutic exercises, Therapeutic activity, Neuromuscular re-education, Balance training, Gait training, Patient/Family education, Self Care, Joint mobilization, DME instructions, Aquatic Therapy, Dry Needling, Electrical stimulation, Spinal mobilization, Cryotherapy, Moist heat, Taping, Ionotophoresis 4mg /ml Dexamethasone, Manual therapy, and Re-evaluation  PLAN FOR NEXT SESSION: check HEP, manual therapy, Cervical traction, modalities, TPDN, work on balance goals, steps, etc, see goals   [employer] MS, PT 04/14/23 10:42 AM

## 2023-04-14 ENCOUNTER — Ambulatory Visit: Payer: Medicaid Other

## 2023-04-14 DIAGNOSIS — M62830 Muscle spasm of back: Secondary | ICD-10-CM | POA: Diagnosis not present

## 2023-04-14 DIAGNOSIS — M542 Cervicalgia: Secondary | ICD-10-CM | POA: Diagnosis not present

## 2023-04-14 DIAGNOSIS — G8929 Other chronic pain: Secondary | ICD-10-CM

## 2023-04-14 DIAGNOSIS — R252 Cramp and spasm: Secondary | ICD-10-CM

## 2023-04-14 DIAGNOSIS — M5459 Other low back pain: Secondary | ICD-10-CM | POA: Diagnosis not present

## 2023-04-14 DIAGNOSIS — M25512 Pain in left shoulder: Secondary | ICD-10-CM | POA: Diagnosis not present

## 2023-04-14 DIAGNOSIS — R262 Difficulty in walking, not elsewhere classified: Secondary | ICD-10-CM | POA: Diagnosis not present

## 2023-04-16 DIAGNOSIS — Z419 Encounter for procedure for purposes other than remedying health state, unspecified: Secondary | ICD-10-CM | POA: Diagnosis not present

## 2023-04-19 ENCOUNTER — Ambulatory Visit: Payer: Medicaid Other | Admitting: Physical Therapy

## 2023-04-20 ENCOUNTER — Other Ambulatory Visit (HOSPITAL_COMMUNITY): Payer: Self-pay | Admitting: Family Medicine

## 2023-04-21 ENCOUNTER — Other Ambulatory Visit (HOSPITAL_COMMUNITY): Payer: Self-pay

## 2023-04-21 ENCOUNTER — Ambulatory Visit: Payer: Medicaid Other | Admitting: Physical Therapy

## 2023-04-21 ENCOUNTER — Telehealth (HOSPITAL_COMMUNITY): Payer: Self-pay | Admitting: Pharmacy Technician

## 2023-04-21 NOTE — Telephone Encounter (Signed)
Patient Advocate Encounter   Received notification from [facility] that prior authorization for Marcelline Deist is required.   PA submitted on CoverMyMeds [facility] Status is pending   Will continue to follow.

## 2023-04-21 NOTE — Therapy (Deleted)
OUTPATIENT PHYSICAL THERAPY SHOULDER/CERVICAL TREATMENT   Patient Name: Maria Hudson MRN: [id number] DOB:03-17-1961, 62 y.o., female Today's Date: 04/21/2023  END OF SESSION:       Past Medical History:  Diagnosis Date   Acute on chronic diastolic heart failure (HCC) 03/05/2021   Anemia    Arthritis    CAD (coronary artery disease)    CHF (congestive heart failure) (HCC)    CKD (chronic kidney disease) stage 4, GFR 15-29 ml/min (HCC) 03/05/2021   Colon polyps    CVA (cerebral vascular accident) (HCC)    Depression    Diabetes mellitus without complication (HCC)    type 2   Dyspnea    Hypertension    Hypertensive urgency 04/14/2021   Memory loss    mild   OSA (obstructive sleep apnea) 05/27/2021   uses CPAP 12-18 cm   Pneumonia    several times   Pure hypercholesterolemia 05/27/2021   Renal disorder    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   Stroke [facility])    Past Surgical History:  Procedure Laterality Date   ABDOMINAL HYSTERECTOMY     AV FISTULA PLACEMENT Left 12/09/2021   Procedure: LEFT ARTERIOVENOUS (AV) FISTULA CREATION;  Surgeon: Maeola Harman, MD;  Location: [facility] OR;  Service: Vascular;  Laterality: Left;   BIOPSY  09/08/2021   Procedure: BIOPSY;  Surgeon: Sherrilyn Rist, MD;  Location: [facility] ENDOSCOPY;  Service: Gastroenterology;;   CESAREAN SECTION     x 1   ESOPHAGOGASTRODUODENOSCOPY (EGD) WITH PROPOFOL N/A 09/08/2021   Procedure: ESOPHAGOGASTRODUODENOSCOPY (EGD) WITH PROPOFOL;  Surgeon: Sherrilyn Rist, MD;  Location: [facility];  Service: Gastroenterology;  Laterality: N/A;  chest pain, epigastric pain   INNER EAR SURGERY Bilateral    RIGHT HEART CATH N/A 05/14/2021   Procedure: RIGHT HEART CATH;  Surgeon: Laurey Morale, MD;  Location: [facility] INVASIVE CV LAB;  Service: Cardiovascular;  Laterality: N/A;   TONSILLECTOMY     age 40   Patient Active Problem List   Diagnosis Date Noted   History of colonic polyps 12/28/2022   Chronic  constipation 03/31/2022   Pain due to onychomycosis of toenails of both feet 12/05/2021   Pure hypercholesterolemia 05/27/2021   Chronic diastolic CHF (congestive heart failure) (HCC) 04/29/2021   Hypertensive urgency 04/14/2021   Acute on chronic diastolic CHF (congestive heart failure) (HCC) 04/14/2021   Chest pain 03/05/2021   CAD (coronary artery disease) 03/05/2021   T2DM (type 2 diabetes mellitus) (HCC) 03/05/2021   CKD (chronic kidney disease) stage 4, GFR 15-29 ml/min (HCC) 03/05/2021   Class 3 obesity 03/05/2021   CVA (cerebral vascular accident) (HCC) 03/05/2021   Acute on chronic diastolic heart failure (HCC) 03/05/2021   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   AKI (acute kidney injury) (HCC) 03/05/2021   Vitreous hemorrhage of left eye (HCC) 11/14/2020   Orthopnea 08/06/2020   Vision loss of right eye 08/06/2020   Excessive daytime sleepiness 01/11/2020   Iron deficiency anemia 12/14/2019   Vitreous hemorrhage, right (HCC) 12/04/2019   Diabetic neuropathy (HCC) 07/26/2019   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) 02/03/2019   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   Conductive hearing loss, bilateral 03/03/2018   Chronic bilateral low back pain without sciatica 08/21/2015   Chronic kidney disease (CKD) stage G3b/A3, moderately decreased glomerular filtration rate (GFR) between 30-44 mL/min/1.73 square meter and albuminuria creatinine ratio greater than 300 mg/g (HCC) 04/30/2015   Type 2 diabetes mellitus with  diabetic polyneuropathy, with long-term current use of insulin (HCC) 02/15/2015   Advance directive on file 05/17/2014   Refusal of blood transfusions as patient is Jehovah's Witness 05/17/2014   OSA treated with BiPAP 02/23/2014   Coronary artery disease 07/07/2013   Severe obesity (BMI >= 40) (HCC) 05/18/2013   Neuropathy 04/11/2013   Diastolic heart failure (HCC) 10/16/2012   Essential  hypertension 10/16/2012    PCP: Fleet Contras MD   REFERRING PROVIDER: Dr. Geraldine Solar, Allayne Gitelman, PA-C  REFERRING DIAG:  [phone] (ICD-10-CM) - Chronic left shoulder pain  M54.2 (ICD-10-CM) - Neck pain    THERAPY DIAG:  No diagnosis found.  Rationale for Evaluation and Treatment: Rehabilitation  ONSET DATE: chronic   SUBJECTIVE:     Pt reports her neck is better, but her upper shoulders and upper back continue to bother her. Today we are at a 3/10 in the neck. Upper back 7/10. Also left wrist hurts from using the therabands.                                               SUBJECTIVE STATEMENT: Pt has had chronic L sided neck and shoulder pain, since 1996. She was working at [employer] and she slipped and fell a few times within a few days.  She has had pain since then.   She was not treated at that time. The doctor said it is likely coming from my neck. She had XR but no MRI.  Dr. Magnus Ivan injected her shoulder yesterday and no change as of today.   She reports difficulty walking, turning her head, using her L arm for home tasks and ADLs. "My whole L side is weaker"  Reports N/T L hand  Weaker in L UE  She also complains of bilateral hip and back pain, for which she is not being treated.  Does have issues with balance but not sure why.  She did have a stroke but reports that only affected her memory.  She is not sure if she has Neuropathy.      Hand dominance: Right  PERTINENT HISTORY: Diabetes, CVA, back pain     PAIN: 03/26/23 Are you having pain? Yes: NPRS scale: 3/10 Pain location: L neck  Pain description: tight, stiff Aggravating factors: sleeping on L side, activity  Relieving factors: Tylenol, hot bath Headache often   PRECAUTIONS: None  RED FLAGS: None   WEIGHT BEARING RESTRICTIONS: No  FALLS:  Has patient fallen in last 6 months? No   LIVING ENVIRONMENT: Lives with: lives with their family Lives in: House/apartment Stairs: Yes:  Internal: 12 steps; on left going up Has following equipment at home: Single point cane and Walker - 2 wheeled Used walker about a month (4 wheeled)   OCCUPATION: Not working right now "Little bit if everything"  PLOF: Independent, Independent with household mobility without device, Independent with community mobility with device, Needs assistance with ADLs, Leisure: family time, TV, cards and games, and not exercising.  I used to love to exercise.  She used to do at home on her own.   Pt does not have a bed.  Sleeps on a couch.  Walking is painful hips, low back.    PATIENT GOALS: I want to get out of pain.    NEXT MD VISIT:   OBJECTIVE:  Note: Objective measures were completed at Evaluation unless  otherwise noted.  DIAGNOSTIC FINDINGS:  Left shoulder 3 views: Shoulder is well located.  Subacromial space  well-maintained.  Mild AC joint arthritic changes.  Glenohumeral joints  well-maintained.  No acute fractures bony abnormalities.  Cervical spine 2 views: No acute fracture.  Loss of lordotic curvature.   Diminished disc space C6 -C7.  No spondylolisthesis.  No acute fractures.   No other bony abnormalities.  PATIENT SURVEYS:  FOTO 43% neck, 43% shoulder   COGNITION: Overall cognitive status: History of cognitive impairments - at baseline memory per pt.      SENSATION: WFL   POSTURE: Forward head, somewhat rounded upper back   UPPER EXTREMITY ROM:  Active ROM Right eval Left eval Left 04/07/23  Shoulder flexion [facility]  WFL pain  WFL Pain  Shoulder extension     Shoulder abduction WFL  120 pain  150 pain  Shoulder adduction     Shoulder internal rotation WFL Min pain reaches to low back  Mid thoracic No pain   Shoulder external rotation WFL  Pain , limited but NT T2 pain  Elbow flexion     Elbow extension     Wrist flexion     Wrist extension     Wrist ulnar deviation     Wrist radial deviation     Wrist pronation     Wrist supination     (Blank rows = not  tested)   CERVICAL AROM  EVAL 04/14/23   flexion 30 deg    extension 50 deg    L SB  25 35   R SB  40    L rotation 25 58   R rotation  WNL       UPPER EXTREMITY MMT:  MMT Right eval Left eval  Shoulder flexion 4 4-  Shoulder extension    Shoulder abduction 4 4-  Shoulder adduction    Shoulder internal rotation NT 4  Shoulder external rotation NT  4  Middle trapezius    Lower trapezius    Elbow flexion  5  Elbow extension  4  Wrist flexion    Wrist extension    Wrist ulnar deviation    Wrist radial deviation    Wrist pronation    Wrist supination    Grip strength (lbs)    (Blank rows = not tested)  FUNCTIONAL TESTS 03/26/23: 2 MWT 344 feet (LBP/Hip pain 4/10 increased to  7/10) 03/26/23: 14.1 sec  no AD, staggers with turn to sit , corrects with reaching strategy  03/26/23: BERG Balance: 52/56  SHOULDER SPECIAL TESTS: NT  JOINT MOBILITY TESTING:  Unable to tolerate in cervical spine No stiffness in L GH joint   PALPATION:  Pain and spasm L upper trap   TODAY'S TREATMENT:    [facility] Adult PT Treatment:                                                DATE: 04/21/23 Therapeutic Exercise: *** Manual Therapy: *** Neuromuscular re-ed: *** Therapeutic Activity: *** Modalities: *** Self Care: [facility] Adult PT Treatment:                                                DATE: 04/14/23 Therapeutic  Exercise: Supine chin tuck x10 3" Supine DNF lift offs x10 5" Supine shoulder abd star pattern 2x5 RTB Supine bilat ER 2x10 RTB Seated R Cervical Sidebending Stretch 3 reps - 20 hold Seated R Cervical Rotation with Nod 3 reps - 20 hold Manual Therapy: Skilled palpation to the L upper shoulder and mid back to identify TrPs and taut muscle bands  STM to the upper trap, levator, and mid back paraspinals and rhomboids Trigger Point Dry Needling Treatment: Pre-treatment instruction: Patient instructed on dry needling rationale, procedures, and possible side effects  including pain during treatment (achy,cramping feeling), bruising, drop of blood, lightheadedness, nausea, sweating. Patient Consent Given: Yes Education handout provided: Yes Muscles treated: Bilat upper trap, mid back paraspinals, and rhomboids Needle size and number: .30x30mm x 2 Electrical stimulation performed: No Parameters: N/A Treatment response/outcome: Twitch response elicited Post-treatment instructions: Patient instructed to expect possible mild to moderate muscle soreness later today and/or tomorrow. Patient instructed in methods to reduce muscle soreness and to continue prescribed HEP. If patient was dry needled over the lung field, patient was instructed on signs and symptoms of pneumothorax and, however unlikely, to see immediate medical attention should they occur. Patient was also educated on signs and symptoms of infection and to seek medical attention should they occur. Patient verbalized understanding of these instructions and education.   [facility] Adult PT Treatment:                                                DATE: 04/12/23 Therapeutic Exercise: Seated chin tuck Seated scap retract Seated shoulder rolls  Standing back to wall with chin tuck: AROM for horiz abdct , Goal post , diagonals x 10 each (no resiatnce bands today due to new onset of left wrist pain STS x 5 - c/o knee pain / popping  Therapeutic Activity: tandem 17 sec  LLE back  tandem 13 sec RLE back SLS 4 sec L and R Side stepping at counter red band x 6 passes Manual Therapy: Cervical distraction  Passive cervical rotation , upper trap stretch  Modalities:  HMP to cervical x 10 minutes     [facility] Adult PT Treatment:                                                DATE: 04/07/23 Therapeutic Exercise: Upper trap and levator stretch  Shoulder rolls  Seated scap retract  Seated chin tuck  Row Red 5 sec x 10 Standing shoulder flexion pullovers with dowel x 8 Standing ER with scap retract  YTB 10 x 2 ,  min pain L shoulder Doorway stretch x 2 for 30 sec  Open books standified for turning left due to increased pain.  Supine yellow band horix  Supine alt diagonals yellow Supine chin tuck towel roll Supine chin tuck with towel roll 5 sec x 10 DNF 3 sec x 5   Manual Therapy: Cervical distraction  Passive cervical rotation     [facility] Adult PT Treatment:  DATE: 04/05/23 Therapeutic Exercise: Cervical stretches, review and perform x 3 each side: upper trap, rotation Supine small ROM rotation with towel under neck  Supine chin tuck x 5 sec x 10  Scapular retraction x 10   Chin tuck with slight rotation x 5 each side  Supine chin tuck with shoulder flexion with dowel  Supine horizontal abduction and narrow grip flexion yellow 2 x 10  ER Yellow band 2 x 15 , min pain L shoulder Standing against wall for shoulder flexion  Standing open book  x 5 offered supine as well  Manual Therapy: NA  Self Care: HEP techniques and streamlining for routine    PATIENT EDUCATION: Education details: PT, POC, HEP  Person educated: Patient Education method: Programmer, multimedia, Demonstration, Verbal cues, and Handouts Education comprehension: verbalized understanding, returned demonstration, and needs further education  HOME EXERCISE PROGRAM: Access Code: [email or web address] URL: [email or web address] Date: 04/05/2023 Prepared by: Karie Mainland  Exercises - Seated Scapular Retraction  - 2 x daily - 7 x weekly - 2 sets - 10 reps - 5 hold - Seated Chin Tuck with Neck Elongation  - 2 x daily - 7 x weekly - 2 sets - 10 reps - 5 hold - Seated Cervical Sidebending Stretch  - 2 x daily - 7 x weekly - 1 sets - 3-5 reps - 20-30 hold - Seated Cervical Rotation with Nod  - 1 x daily - 7 x weekly - 1 sets - 3-5 reps - 20-30 hold - Supine Cervical Retraction with Towel  - 2 x daily - 7 x weekly - 1 sets - 10 reps - 3 hold - Supine DNF Liftoffs  - 2 x daily - 7 x weekly -  1 sets - 10 reps - 5 hold - Standing Thoracic Open Book at Wall  - 1 x daily - 7 x weekly - 2 sets - 10 reps - 5-10 hold - Standing Shoulder Flexion AAROM with Dowel  - 1 x daily - 7 x weekly - 2 sets - 10 reps - 5 hold - Shoulder External Rotation and Scapular Retraction with Resistance  - 1 x daily - 7 x weekly - 2 sets - 10 reps - 5 hold - Standing Shoulder Horizontal Abduction with Resistance  - 1 x daily - 7 x weekly - 2 sets - 10 reps - 5 hold   ASSESSMENT:  CLINICAL IMPRESSION: PT focused on muscular pain of the upper shoulders and mid back. STM was f/b TPDN of the bilat upper traps, mid back paraspinals and rhomboids. Therex for postural and posterior chain strengthening and cervical flexibility were then completed for muscle activation. Overall, pt's neck pain is improving as well as her cervical ROM for L SB and rot which have been significantly limited. Pt tolerated PT today without adverse effects. Pt will continue to benefit from skilled PT to address impairments for improved neck function with less pain.    OBJECTIVE IMPAIRMENTS: Abnormal gait, decreased activity tolerance, decreased balance, decreased coordination, decreased mobility, difficulty walking, decreased ROM, decreased strength, increased fascial restrictions, increased muscle spasms, impaired flexibility, impaired UE functional use, postural dysfunction, obesity, and pain.   ACTIVITY LIMITATIONS: carrying, lifting, bending, standing, squatting, sleeping, stairs, transfers, bed mobility, dressing, reach over head, and locomotion level  PARTICIPATION LIMITATIONS: meal prep, cleaning, laundry, interpersonal relationship, shopping, and community activity  PERSONAL FACTORS: Past/current experiences, Time since onset of injury/illness/exacerbation, and 3+ comorbidities: CVA, chronic pain, diabetes   are also affecting patient's functional outcome.   REHAB  POTENTIAL: Good  CLINICAL DECISION MAKING: Evolving/moderate  complexity  EVALUATION COMPLEXITY: Moderate   GOALS: Goals reviewed with patient? Yes  SHORT TERM GOALS: Target date: 04/15/2023    Pt will be I with cervical AROM and posture HEP Baseline unknown Goal status: MET  2.  Pt will be able to improve AROM of L shoulder to WNL with min increase in shoulder pain  Baseline: limited in functional reach 04/07/23: WNL however pain   Goal status: ongoing   3.  Patient will be screened for balance/fall risk and goal set Baseline: NT on eval  Goal status: MET   LONG TERM GOALS: Target date: 05/13/2023    Patient will be independent with home exercise program upon discharge Baseline: unknown  Goal status: INITIAL  2.  Patient will demo cervical active range of motion without increased pain  Baseline: limited in rotation, extension and side bending  04/07/23: continued visual limitation esp left sb and left rotation 04/12/23: pain still present with side bending , others are less painful 04/14/23: ROM for L SB and rot are improved as well as concordant pain Goal status: ONGOING  3.  Patient will report min increase in pain with ADLs and light housework Baseline: mod to severe  04/12/23: Mostly moderate , has been avoiding the stairs, plans to go up today for laundry  Goal status: ONGOING  4.  Patient will be able to walk up her stairs with min increase in pain in order to do laundry Baseline: avoid this due to hip pain  04/12/23: will climb today and see response , has avoided for 4 months Goal status: ONGOING  5.  Patient will participate in short, daily walking program in order to improve overall health and mobility. Baseline: does not do this  04/07/23: walk in store nearly daily Goal status: ONGOING  6.  Patient will score < 12 sec on TUG without LOB indicating decreased fall risk.  Baseline: 14 sec Goal status: NEW   PLAN:  PT FREQUENCY: 2x/week  PT DURATION: 8 weeks  PLANNED INTERVENTIONS: Therapeutic exercises,  Therapeutic activity, Neuromuscular re-education, Balance training, Gait training, Patient/Family education, Self Care, Joint mobilization, DME instructions, Aquatic Therapy, Dry Needling, Electrical stimulation, Spinal mobilization, Cryotherapy, Moist heat, Taping, Ionotophoresis 4mg /ml Dexamethasone, Manual therapy, and Re-evaluation  PLAN FOR NEXT SESSION: check HEP, manual therapy, Cervical traction, modalities, TPDN, work on balance goals, steps, etc, see goals   [employer] MS, PT 04/21/23 5:23 AM

## 2023-04-22 ENCOUNTER — Ambulatory Visit: Payer: Medicaid Other | Attending: Physician Assistant | Admitting: Physical Therapy

## 2023-04-22 DIAGNOSIS — M25512 Pain in left shoulder: Secondary | ICD-10-CM | POA: Diagnosis not present

## 2023-04-22 DIAGNOSIS — G8929 Other chronic pain: Secondary | ICD-10-CM | POA: Insufficient documentation

## 2023-04-22 DIAGNOSIS — M542 Cervicalgia: Secondary | ICD-10-CM | POA: Diagnosis not present

## 2023-04-22 DIAGNOSIS — R252 Cramp and spasm: Secondary | ICD-10-CM | POA: Diagnosis not present

## 2023-04-22 NOTE — Patient Instructions (Signed)
Aquatic Therapy at [facility]-  What to Expect!  Where:   [facility] [address] [place], [place] [address] Rehab phone [phone]  NOTE:  You will receive an automated phone message reminding you of your appt and it will say the appointment is at the [address] [facility] [address] [facility] clinic.          How to Prepare: Please make sure you drink 8 ounces of water about one hour prior to your pool session A caregiver may attend if needed with the patient to help assist as needed. A caregiver can sit in the pool room on chair. Please arrive IN YOUR SUIT and 15 minutes prior to your appointment - this helps to avoid delays in starting your session. Please make sure to attend to any toileting needs prior to entering the pool Locker rooms for changing are provided.   There is direct access to the pool deck form the locker room.  You can lock your belongings in a locker with lock provided. Once on the pool deck your therapist will ask if you have signed the Patient  Consent and Assignment of Benefits form before beginning treatment Your therapist may take your blood pressure prior to, during and after your session if indicated We usually try and create a home exercise program based on activities we do in the pool.  Please be thinking about who might be able to assist you in the pool should you need to participate in an aquatic home exercise program at the time of discharge if you need assistance.  Some patients do not want to or do not have the ability to participate in an aquatic home program - this is not a barrier in any way to you participating in aquatic therapy as part of your current therapy plan! After Discharge from PT, you can continue using home program at  the [facility]/, there is a drop-in fee for $5 ($45 a month)or for 60 years  or older $4.00 ($40 a month for seniors ) or any local [employer] pool.  Memberships for purchase are  available for gym/pool at [facility]  IT IS VERY IMPORTANT THAT YOUR LAST VISIT BE IN THE CLINIC AT [address] AFTER YOUR LAST AQUATIC VISIT.  PLEASE MAKE SURE THAT YOU HAVE A [address]  APPOINTMENT SCHEDULED.   About the pool: Pool is located approximately 500 FT from the entrance of the building.  Please bring a support person if you need assistance traveling this      distance.   Your therapist will assist you in entering the water; there are two ways to           enter: stairs with railings, and a mechanical lift. Your therapist will determine the most appropriate way for you.  Water temperature is usually between 88-90 degrees  There may be up to 2 other swimmers in the pool at the same time  The pool deck is tile, please wear shoes with good traction if you prefer not to be barefoot.    Contact Info:  For appointment scheduling and cancellations:         Please call the [facility]  PH:[phone]              Aquatic Therapy  [facility]patient [facility]       All sessions are 45 minutes                                                    

## 2023-04-22 NOTE — Therapy (Addendum)
OUTPATIENT PHYSICAL THERAPY SHOULDER/CERVICAL TREATMENT/DISCHARGE   Patient Name: Maria Hudson MRN: [id number] DOB:16-Jun-1960, 62 y.o., female Today's Date: 04/22/2023   PHYSICAL THERAPY DISCHARGE SUMMARY  Visits from Start of Care: 8  Current functional level related to goals / functional outcomes: See below    Remaining deficits: See below    Education / Equipment: HEP   Patient agrees to discharge. Patient goals were not met. Patient is being discharged due to the patient's request. Pt was ill then did not reschedule    END OF SESSION:  PT End of Session - 04/22/23 0849     Visit Number 8    Number of Visits 17    Date for PT Re-Evaluation 05/13/23    Authorization Type [facility]    Authorization Time Period Approved 10 visits 03/22/23-05/21/23    Authorization - Visit Number 7    Authorization - Number of Visits 10    PT Start Time 0846    PT Stop Time 0925    PT Time Calculation (min) 39 min                Past Medical History:  Diagnosis Date   Acute on chronic diastolic heart failure (HCC) 03/05/2021   Anemia    Arthritis    CAD (coronary artery disease)    CHF (congestive heart failure) (HCC)    CKD (chronic kidney disease) stage 4, GFR 15-29 ml/min (HCC) 03/05/2021   Colon polyps    CVA (cerebral vascular accident) (HCC)    Depression    Diabetes mellitus without complication (HCC)    type 2   Dyspnea    Hypertension    Hypertensive urgency 04/14/2021   Memory loss    mild   OSA (obstructive sleep apnea) 05/27/2021   uses CPAP 12-18 cm   Pneumonia    several times   Pure hypercholesterolemia 05/27/2021   Renal disorder    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   Stroke [facility])    Past Surgical History:  Procedure Laterality Date   ABDOMINAL HYSTERECTOMY     AV FISTULA PLACEMENT Left 12/09/2021   Procedure: LEFT ARTERIOVENOUS (AV) FISTULA CREATION;  Surgeon: Maeola Harman, MD;  Location: [facility] OR;  Service: Vascular;   Laterality: Left;   BIOPSY  09/08/2021   Procedure: BIOPSY;  Surgeon: Sherrilyn Rist, MD;  Location: [facility] ENDOSCOPY;  Service: Gastroenterology;;   CESAREAN SECTION     x 1   ESOPHAGOGASTRODUODENOSCOPY (EGD) WITH PROPOFOL N/A 09/08/2021   Procedure: ESOPHAGOGASTRODUODENOSCOPY (EGD) WITH PROPOFOL;  Surgeon: Sherrilyn Rist, MD;  Location: [facility] ENDOSCOPY;  Service: Gastroenterology;  Laterality: N/A;  chest pain, epigastric pain   INNER EAR SURGERY Bilateral    RIGHT HEART CATH N/A 05/14/2021   Procedure: RIGHT HEART CATH;  Surgeon: Laurey Morale, MD;  Location: [facility] INVASIVE CV LAB;  Service: Cardiovascular;  Laterality: N/A;   TONSILLECTOMY     age 35   Patient Active Problem List   Diagnosis Date Noted   History of colonic polyps 12/28/2022   Chronic constipation 03/31/2022   Pain due to onychomycosis of toenails of both feet 12/05/2021   Pure hypercholesterolemia 05/27/2021   Chronic diastolic CHF (congestive heart failure) (HCC) 04/29/2021   Hypertensive urgency 04/14/2021   Acute on chronic diastolic CHF (congestive heart failure) (HCC) 04/14/2021   Chest pain 03/05/2021   CAD (coronary artery disease) 03/05/2021   T2DM (type 2 diabetes mellitus) (HCC) 03/05/2021   CKD (chronic kidney disease) stage 4, GFR 15-29  ml/min (HCC) 03/05/2021   Class 3 obesity 03/05/2021   CVA (cerebral vascular accident) (HCC) 03/05/2021   Acute on chronic diastolic heart failure (HCC) 03/05/2021   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   AKI (acute kidney injury) (HCC) 03/05/2021   Vitreous hemorrhage of left eye (HCC) 11/14/2020   Orthopnea 08/06/2020   Vision loss of right eye 08/06/2020   Excessive daytime sleepiness 01/11/2020   Iron deficiency anemia 12/14/2019   Vitreous hemorrhage, right (HCC) 12/04/2019   Diabetic neuropathy (HCC) 07/26/2019   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) 02/03/2019   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   Conductive hearing loss, bilateral 03/03/2018   Chronic bilateral low back pain without sciatica 08/21/2015   Chronic kidney disease (CKD) stage G3b/A3, moderately decreased glomerular filtration rate (GFR) between 30-44 mL/min/1.73 square meter and albuminuria creatinine ratio greater than 300 mg/g (HCC) 04/30/2015   Type 2 diabetes mellitus with diabetic polyneuropathy, with long-term current use of insulin (HCC) 02/15/2015   Advance directive on file 05/17/2014   Refusal of blood transfusions as patient is Jehovah's Witness 05/17/2014   OSA treated with BiPAP 02/23/2014   Coronary artery disease 07/07/2013   Severe obesity (BMI >= 40) (HCC) 05/18/2013   Neuropathy 04/11/2013   Diastolic heart failure (HCC) 10/16/2012   Essential hypertension 10/16/2012    PCP: Fleet Contras MD   REFERRING PROVIDER: Dr. Geraldine Solar, Allayne Gitelman, PA-C  REFERRING DIAG:  [phone] (ICD-10-CM) - Chronic left shoulder pain  M54.2 (ICD-10-CM) - Neck pain    THERAPY DIAG:  Cervicalgia  Chronic left shoulder pain  Cramp and spasm  Rationale for Evaluation and Treatment: Rehabilitation  ONSET DATE: chronic   SUBJECTIVE:     Pt reports neck and shoulder 2-3/10. She is having more trouble with upper and lower back pain up to 6/10.                                       SUBJECTIVE STATEMENT: Pt has had chronic L sided neck and shoulder pain, since 1996. She was working at [employer] and she slipped and fell a few times within a few days.  She has had pain since then.   She was not treated at that time. The doctor said it is likely coming from my neck. She had XR but no MRI.  Dr. Magnus Ivan injected her shoulder yesterday and no change as of today.   She reports difficulty walking, turning her head, using her L arm for home tasks and ADLs. "My whole L side is weaker"  Reports N/T L hand  Weaker in L UE  She also complains of bilateral hip and back pain, for which she is  not being treated.  Does have issues with balance but not sure why.  She did have a stroke but reports that only affected her memory.  She is not sure if she has Neuropathy.      Hand dominance: Right  PERTINENT HISTORY: Diabetes, CVA, back pain     PAIN: 03/26/23 Are you having pain? Yes: NPRS scale: 2-3/10 Pain location: L neck , left shoulder  Pain description: tight, stiff Aggravating factors: sleeping on L side, activity  Relieving factors: Tylenol, hot bath Headache often   PRECAUTIONS: None  RED FLAGS: None   WEIGHT BEARING RESTRICTIONS: No  FALLS:  Has patient fallen in last 6 months?  No   LIVING ENVIRONMENT: Lives with: lives with their family Lives in: House/apartment Stairs: Yes: Internal: 12 steps; on left going up Has following equipment at home: Single point cane and Walker - 2 wheeled Used walker about a month (4 wheeled)   OCCUPATION: Not working right now "Little bit if everything"  PLOF: Independent, Independent with household mobility without device, Independent with community mobility with device, Needs assistance with ADLs, Leisure: family time, TV, cards and games, and not exercising.  I used to love to exercise.  She used to do at home on her own.   Pt does not have a bed.  Sleeps on a couch.  Walking is painful hips, low back.    PATIENT GOALS: I want to get out of pain.    NEXT MD VISIT:   OBJECTIVE:  Note: Objective measures were completed at Evaluation unless otherwise noted.  DIAGNOSTIC FINDINGS:  Left shoulder 3 views: Shoulder is well located.  Subacromial space  well-maintained.  Mild AC joint arthritic changes.  Glenohumeral joints  well-maintained.  No acute fractures bony abnormalities.  Cervical spine 2 views: No acute fracture.  Loss of lordotic curvature.   Diminished disc space C6 -C7.  No spondylolisthesis.  No acute fractures.   No other bony abnormalities.  PATIENT SURVEYS:  FOTO 43% neck, 43% shoulder    COGNITION: Overall cognitive status: History of cognitive impairments - at baseline memory per pt.      SENSATION: WFL   POSTURE: Forward head, somewhat rounded upper back   UPPER EXTREMITY ROM:  Active ROM Right eval Left eval Left 04/07/23  Shoulder flexion [facility]  WFL pain  WFL Pain  Shoulder extension     Shoulder abduction WFL  120 pain  150 pain  Shoulder adduction     Shoulder internal rotation WFL Min pain reaches to low back  Mid thoracic No pain   Shoulder external rotation WFL  Pain , limited but NT T2 pain  Elbow flexion     Elbow extension     Wrist flexion     Wrist extension     Wrist ulnar deviation     Wrist radial deviation     Wrist pronation     Wrist supination     (Blank rows = not tested)   CERVICAL AROM  EVAL 04/14/23   flexion 30 deg    extension 50 deg    L SB  25 35   R SB  40    L rotation 25 58   R rotation  WNL       UPPER EXTREMITY MMT:  MMT Right eval Left eval  Shoulder flexion 4 4-  Shoulder extension    Shoulder abduction 4 4-  Shoulder adduction    Shoulder internal rotation NT 4  Shoulder external rotation NT  4  Middle trapezius    Lower trapezius    Elbow flexion  5  Elbow extension  4  Wrist flexion    Wrist extension    Wrist ulnar deviation    Wrist radial deviation    Wrist pronation    Wrist supination    Grip strength (lbs)    (Blank rows = not tested)  FUNCTIONAL TESTS 03/26/23: 2 MWT 344 feet (LBP/Hip pain 4/10 increased to  7/10) 03/26/23: 14.1 sec  no AD, staggers with turn to sit , corrects with reaching strategy  03/26/23: BERG Balance: 52/56 04/22/23: 19.5 sec TUG, no AD  SHOULDER SPECIAL TESTS: NT  JOINT MOBILITY  TESTING:  Unable to tolerate in cervical spine No stiffness in L GH joint   PALPATION:  Pain and spasm L upper trap   TODAY'S TREATMENT:   [facility] Adult PT Treatment:                                                DATE: 04/22/23 Therapeutic Exercise: Nustep L4 x 5  minutes Shoulder rolls  Scap retraction   Therapeutic Activity: TUG FOTO  Self Care: Home Cervical traction options- given handouts  Discussed benefits of Aquatic therapy and arranged treatments   [facility] Adult PT Treatment:                                                DATE: 04/14/23 Therapeutic Exercise: Supine chin tuck x10 3" Supine DNF lift offs x10 5" Supine shoulder abd star pattern 2x5 RTB Supine bilat ER 2x10 RTB Seated R Cervical Sidebending Stretch 3 reps - 20 hold Seated R Cervical Rotation with Nod 3 reps - 20 hold Manual Therapy: Skilled palpation to the L upper shoulder and mid back to identify TrPs and taut muscle bands  STM to the upper trap, levator, and mid back paraspinals and rhomboids Trigger Point Dry Needling Treatment: Pre-treatment instruction: Patient instructed on dry needling rationale, procedures, and possible side effects including pain during treatment (achy,cramping feeling), bruising, drop of blood, lightheadedness, nausea, sweating. Patient Consent Given: Yes Education handout provided: Yes Muscles treated: Bilat upper trap, mid back paraspinals, and rhomboids Needle size and number: .30x58mm x 2 Electrical stimulation performed: No Parameters: N/A Treatment response/outcome: Twitch response elicited Post-treatment instructions: Patient instructed to expect possible mild to moderate muscle soreness later today and/or tomorrow. Patient instructed in methods to reduce muscle soreness and to continue prescribed HEP. If patient was dry needled over the lung field, patient was instructed on signs and symptoms of pneumothorax and, however unlikely, to see immediate medical attention should they occur. Patient was also educated on signs and symptoms of infection and to seek medical attention should they occur. Patient verbalized understanding of these instructions and education.   [facility] Adult PT Treatment:                                                 DATE: 04/12/23 Therapeutic Exercise: Seated chin tuck Seated scap retract Seated shoulder rolls  Standing back to wall with chin tuck: AROM for horiz abdct , Goal post , diagonals x 10 each (no resiatnce bands today due to new onset of left wrist pain STS x 5 - c/o knee pain / popping  Therapeutic Activity: tandem 17 sec  LLE back  tandem 13 sec RLE back SLS 4 sec L and R Side stepping at counter red band x 6 passes Manual Therapy: Cervical distraction  Passive cervical rotation , upper trap stretch  Modalities:  HMP to cervical x 10 minutes     [facility] Adult PT Treatment:  DATE: 04/07/23 Therapeutic Exercise: Upper trap and levator stretch  Shoulder rolls  Seated scap retract  Seated chin tuck  Row Red 5 sec x 10 Standing shoulder flexion pullovers with dowel x 8 Standing ER with scap retract  YTB 10 x 2 , min pain L shoulder Doorway stretch x 2 for 30 sec  Open books standified for turning left due to increased pain.  Supine yellow band horix  Supine alt diagonals yellow Supine chin tuck towel roll Supine chin tuck with towel roll 5 sec x 10 DNF 3 sec x 5   Manual Therapy: Cervical distraction  Passive cervical rotation        PATIENT EDUCATION: Education details: PT, POC, HEP  Person educated: Patient Education method: Programmer, multimedia, Demonstration, Verbal cues, and Handouts Education comprehension: verbalized understanding, returned demonstration, and needs further education  HOME EXERCISE PROGRAM: Access Code: [email or web address] URL: [email or web address] Date: 04/05/2023 Prepared by: Karie Mainland  Exercises - Seated Scapular Retraction  - 2 x daily - 7 x weekly - 2 sets - 10 reps - 5 hold - Seated Chin Tuck with Neck Elongation  - 2 x daily - 7 x weekly - 2 sets - 10 reps - 5 hold - Seated Cervical Sidebending Stretch  - 2 x daily - 7 x weekly - 1 sets - 3-5 reps - 20-30 hold - Seated Cervical Rotation with  Nod  - 1 x daily - 7 x weekly - 1 sets - 3-5 reps - 20-30 hold - Supine Cervical Retraction with Towel  - 2 x daily - 7 x weekly - 1 sets - 10 reps - 3 hold - Supine DNF Liftoffs  - 2 x daily - 7 x weekly - 1 sets - 10 reps - 5 hold - Standing Thoracic Open Book at Wall  - 1 x daily - 7 x weekly - 2 sets - 10 reps - 5-10 hold - Standing Shoulder Flexion AAROM with Dowel  - 1 x daily - 7 x weekly - 2 sets - 10 reps - 5 hold - Shoulder External Rotation and Scapular Retraction with Resistance  - 1 x daily - 7 x weekly - 2 sets - 10 reps - 5 hold - Standing Shoulder Horizontal Abduction with Resistance  - 1 x daily - 7 x weekly - 2 sets - 10 reps - 5 hold   ASSESSMENT:  CLINICAL IMPRESSION: FOTO score improved for both neck and shoulder. She reports her neck and shoulder are much improved however her upper/mid/lower back has been bothering her more. She canceled her PT appt yesterday due to pain.  TUG time not improved today likely due to increased pain. Time spent with self care including benefits of aquatic therapy and also discussed options for HOME cervical traction and given handouts to show her MD at next appointment.  Pt tolerated PT today without adverse effects. Pt will continue to benefit from skilled PT to address impairments for improved neck function with less pain.  Pt will try aquatic therapy for the next 2 visits.   OBJECTIVE IMPAIRMENTS: Abnormal gait, decreased activity tolerance, decreased balance, decreased coordination, decreased mobility, difficulty walking, decreased ROM, decreased strength, increased fascial restrictions, increased muscle spasms, impaired flexibility, impaired UE functional use, postural dysfunction, obesity, and pain.   ACTIVITY LIMITATIONS: carrying, lifting, bending, standing, squatting, sleeping, stairs, transfers, bed mobility, dressing, reach over head, and locomotion level  PARTICIPATION LIMITATIONS: meal prep, cleaning, laundry, interpersonal  relationship, shopping, and community activity  PERSONAL FACTORS: Past/current experiences, Time  since onset of injury/illness/exacerbation, and 3+ comorbidities: CVA, chronic pain, diabetes   are also affecting patient's functional outcome.   REHAB POTENTIAL: Good  CLINICAL DECISION MAKING: Evolving/moderate complexity  EVALUATION COMPLEXITY: Moderate   GOALS: Goals reviewed with patient? Yes  SHORT TERM GOALS: Target date: 04/15/2023    Pt will be I with cervical AROM and posture HEP Baseline unknown Goal status: MET  2.  Pt will be able to improve AROM of L shoulder to WNL with min increase in shoulder pain  Baseline: limited in functional reach 04/07/23: WNL however pain   Goal status: PARTIALLY MET  3.  Patient will be screened for balance/fall risk and goal set Baseline: NT on eval  Goal status: MET   LONG TERM GOALS: Target date: 05/13/2023    Patient will be independent with home exercise program upon discharge Baseline: unknown  Goal status: INITIAL  2.  Patient will demo cervical active range of motion without increased pain  Baseline: limited in rotation, extension and side bending  04/07/23: continued visual limitation esp left sb and left rotation 04/12/23: pain still present with side bending , others are less painful 04/14/23: ROM for L SB and rot are improved as well as concordant pain Goal status: ONGOING  3.  Patient will report min increase in pain with ADLs and light housework Baseline: mod to severe  04/12/23: Mostly moderate , has been avoiding the stairs, plans to go up today for laundry  04/22/23: continues to avoid stairs.  Goal status: ONGOING  4.  Patient will be able to walk up her stairs with min increase in pain in order to do laundry Baseline: avoid this due to hip pain  04/12/23: will climb today and see response , has avoided for 4 months 04/22/23: still has not tried the stairs Goal status: ONGOING  5.  Patient will participate  in short, daily walking program in order to improve overall health and mobility. Baseline: does not do this  04/07/23: walk in store nearly daily Goal status: ONGOING  6.  Patient will score < 12 sec on TUG without LOB indicating decreased fall risk.  Baseline: 14 sec 04/22/23: 19.5 sec  Goal status: ONGOING  PLAN:  PT FREQUENCY: 2x/week  PT DURATION: 8 weeks  PLANNED INTERVENTIONS: Therapeutic exercises, Therapeutic activity, Neuromuscular re-education, Balance training, Gait training, Patient/Family education, Self Care, Joint mobilization, DME instructions, Aquatic Therapy, Dry Needling, Electrical stimulation, Spinal mobilization, Cryotherapy, Moist heat, Taping, Ionotophoresis 4mg /ml Dexamethasone, Manual therapy, and Re-evaluation  PLAN FOR NEXT SESSION: check HEP, manual therapy, Cervical traction, modalities, TPDN, work on balance goals, steps, etc, see goals   Jannette Spanner, PTA 04/22/23 9:46 AM Phone: [phone] Fax: [phone]    Karie Mainland, PT 08/04/23 10:11 AM Phone: [phone] Fax: [phone]

## 2023-04-23 ENCOUNTER — Other Ambulatory Visit (HOSPITAL_COMMUNITY): Payer: Self-pay

## 2023-04-23 ENCOUNTER — Ambulatory Visit: Payer: Self-pay

## 2023-04-26 ENCOUNTER — Other Ambulatory Visit (HOSPITAL_COMMUNITY): Payer: Self-pay

## 2023-04-26 NOTE — Telephone Encounter (Signed)
Prior authorization for Marcelline Deist has been approved. Test billing returns $4 for 90 day supply.  Key: [id number] Effective: 04/25/2023 to 04/23/2024

## 2023-04-28 ENCOUNTER — Ambulatory Visit: Payer: Medicaid Other | Admitting: Physical Therapy

## 2023-04-28 ENCOUNTER — Ambulatory Visit: Payer: Medicaid Other | Admitting: Orthopaedic Surgery

## 2023-05-03 DIAGNOSIS — I708 Atherosclerosis of other arteries: Secondary | ICD-10-CM | POA: Diagnosis not present

## 2023-05-03 DIAGNOSIS — Z01818 Encounter for other preprocedural examination: Secondary | ICD-10-CM | POA: Diagnosis not present

## 2023-05-03 DIAGNOSIS — I700 Atherosclerosis of aorta: Secondary | ICD-10-CM | POA: Diagnosis not present

## 2023-05-03 DIAGNOSIS — I7090 Unspecified atherosclerosis: Secondary | ICD-10-CM | POA: Diagnosis not present

## 2023-05-03 DIAGNOSIS — R933 Abnormal findings on diagnostic imaging of other parts of digestive tract: Secondary | ICD-10-CM | POA: Diagnosis not present

## 2023-05-03 DIAGNOSIS — K551 Chronic vascular disorders of intestine: Secondary | ICD-10-CM | POA: Diagnosis not present

## 2023-05-03 DIAGNOSIS — N186 End stage renal disease: Secondary | ICD-10-CM | POA: Diagnosis not present

## 2023-05-04 DIAGNOSIS — N186 End stage renal disease: Secondary | ICD-10-CM | POA: Diagnosis not present

## 2023-05-05 ENCOUNTER — Encounter: Payer: Self-pay | Admitting: Physical Therapy

## 2023-05-06 ENCOUNTER — Ambulatory Visit: Payer: Medicaid Other | Admitting: Physical Therapy

## 2023-05-07 ENCOUNTER — Other Ambulatory Visit: Payer: Self-pay | Admitting: Obstetrics and Gynecology

## 2023-05-07 DIAGNOSIS — R350 Frequency of micturition: Secondary | ICD-10-CM | POA: Diagnosis not present

## 2023-05-07 DIAGNOSIS — N644 Mastodynia: Secondary | ICD-10-CM | POA: Diagnosis not present

## 2023-05-07 DIAGNOSIS — N184 Chronic kidney disease, stage 4 (severe): Secondary | ICD-10-CM | POA: Diagnosis not present

## 2023-05-07 NOTE — Patient Outreach (Signed)
  Medicaid Managed Care   Unsuccessful Attempt Note   05/07/2023 Name: Maria Hudson MRN: [id number] DOB: May 13, 1961  Referred by: Fleet Contras, MD Reason for referral : High Risk Managed Medicaid (Unsuccessful telephone outreach)  An unsuccessful telephone outreach was attempted today. The patient was referred to the case management team for assistance with care management and care coordination.    Follow Up Plan: The Managed Medicaid care management team will reach out to the patient again over the next 30 business  days. and The  Patient has been provided with contact information for the Managed Medicaid care management team and has been advised to call with any health related questions or concerns.   Kathi Der RN, BSN [employer], [employer] - Managed Medicaid High Risk [phone]

## 2023-05-07 NOTE — Patient Instructions (Signed)
Hi Ms. Drey, I am sorry I missed you today, I hope you are doing okay - as a part of your Medicaid benefit, you are eligible for care management and care coordination services at no cost or copay. I was unable to reach you by phone today but would be happy to help you with your health related needs. Please feel free to call me at [phone].  A member of the Managed Medicaid care management team will reach out to you again over the next 30 business  days.   Kathi Der RN, BSN [place]  [employer] - Managed Medicaid High Risk [phone]

## 2023-05-12 ENCOUNTER — Ambulatory Visit (HOSPITAL_BASED_OUTPATIENT_CLINIC_OR_DEPARTMENT_OTHER): Payer: Medicaid Other | Admitting: Certified Nurse Midwife

## 2023-05-12 ENCOUNTER — Other Ambulatory Visit: Payer: Self-pay | Admitting: Gastroenterology

## 2023-05-12 ENCOUNTER — Telehealth: Payer: Self-pay

## 2023-05-12 ENCOUNTER — Encounter (HOSPITAL_BASED_OUTPATIENT_CLINIC_OR_DEPARTMENT_OTHER): Payer: Self-pay | Admitting: Certified Nurse Midwife

## 2023-05-12 VITALS — BP 133/60 | HR 74 | Ht 63.0 in | Wt 211.6 lb

## 2023-05-12 DIAGNOSIS — Z01419 Encounter for gynecological examination (general) (routine) without abnormal findings: Secondary | ICD-10-CM

## 2023-05-12 NOTE — Telephone Encounter (Signed)
Spoke with patient and informed her that Dr. Tresa Endo will look at her recent CPAP data and will advise. Notified patient that I will return call with provider recommendations.

## 2023-05-12 NOTE — Progress Notes (Signed)
62 y.o. [id number] Divorced Black or [place] American female here for annual exam.    No LMP recorded. Patient is postmenopausal.           Exercising: Yes.     Smoker:  no  Health Maintenance: Pap:  Not indicated per ASCCP guidelines History of abnormal Pap:  no MMG:  UTD Colonoscopy:  UTD BMD:    Screening Labs: PCP collects   reports that she has never smoked. She has never been exposed to tobacco smoke. She has never used smokeless tobacco. She reports that she does not drink alcohol and does not use drugs.  Past Medical History:  Diagnosis Date   Acute on chronic diastolic heart failure (HCC) 03/05/2021   Anemia    Arthritis    CAD (coronary artery disease)    CHF (congestive heart failure) (HCC)    CKD (chronic kidney disease) stage 4, GFR 15-29 ml/min (HCC) 03/05/2021   Colon polyps    CVA (cerebral vascular accident) (HCC)    Depression    Diabetes mellitus without complication (HCC)    type 2   Dyspnea    Hypertension    Hypertensive urgency 04/14/2021   Memory loss    mild   OSA (obstructive sleep apnea) 05/27/2021   uses CPAP 12-18 cm   Pneumonia    several times   Pure hypercholesterolemia 05/27/2021   Renal disorder    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   Stroke [facility])     Past Surgical History:  Procedure Laterality Date   ABDOMINAL HYSTERECTOMY     AV FISTULA PLACEMENT Left 12/09/2021   Procedure: LEFT ARTERIOVENOUS (AV) FISTULA CREATION;  Surgeon: Maeola Harman, MD;  Location: [facility] OR;  Service: Vascular;  Laterality: Left;   BIOPSY  09/08/2021   Procedure: BIOPSY;  Surgeon: Sherrilyn Rist, MD;  Location: [facility] ENDOSCOPY;  Service: Gastroenterology;;   CESAREAN SECTION     x 1   ESOPHAGOGASTRODUODENOSCOPY (EGD) WITH PROPOFOL N/A 09/08/2021   Procedure: ESOPHAGOGASTRODUODENOSCOPY (EGD) WITH PROPOFOL;  Surgeon: Sherrilyn Rist, MD;  Location: [facility];  Service: Gastroenterology;  Laterality: N/A;  chest pain, epigastric pain   INNER  EAR SURGERY Bilateral    RIGHT HEART CATH N/A 05/14/2021   Procedure: RIGHT HEART CATH;  Surgeon: Laurey Morale, MD;  Location: [facility] INVASIVE CV LAB;  Service: Cardiovascular;  Laterality: N/A;   TONSILLECTOMY     age 76    Current Outpatient Medications  Medication Sig Dispense Refill   Accu-Chek Softclix Lancets lancets 3 (three) times daily.     acetaminophen (TYLENOL) 500 MG tablet Take 1,000 mg by mouth every 6 (six) hours as needed for moderate pain or headache.     allopurinol (ZYLOPRIM) 100 MG tablet Take 100 mg by mouth in the morning.     amLODipine (NORVASC) 10 MG tablet Take 10 mg by mouth daily.     aspirin EC 81 MG tablet Take 81 mg by mouth in the morning. Swallow whole.     atorvastatin (LIPITOR) 40 MG tablet Take 1 tablet (40 mg total) by mouth every evening. 90 tablet 3   buPROPion (WELLBUTRIN) 100 MG tablet Take 100 mg by mouth 2 (two) times daily.     carvedilol (COREG) 25 MG tablet Take 1 tablet (25 mg total) by mouth 2 (two) times daily with a meal. 60 tablet 0   cholecalciferol (VITAMIN D3) 25 MCG (1000 UNIT) tablet Take 2,000 Units by mouth daily.     cloNIDine (CATAPRES) 0.2  MG tablet TAKE 2 TABLETS (0.4 MG TOTAL) BY MOUTH 3 (THREE) TIMES DAILY. 504 tablet 3   Continuous Blood Gluc Transmit (DEXCOM G6 TRANSMITTER) MISC Use one every 3 month     dapagliflozin propanediol (FARXIGA) 10 MG TABS tablet TAKE 1 TABLET BY MOUTH DAILY BEFORE BREAKFAST. 30 tablet 11   ferrous sulfate 325 (65 FE) MG tablet Take 325 mg by mouth 2 (two) times daily.     fluticasone (FLONASE) 50 MCG/ACT nasal spray Place 1 spray into both nostrils daily as needed for allergies or rhinitis.     hydrALAZINE (APRESOLINE) 100 MG tablet TAKE 1 TABLET BY MOUTH 3 TIMES DAILY. 270 tablet 2   insulin glargine (LANTUS) 100 unit/mL SOPN Inject 10 Units into the skin at bedtime.     isosorbide mononitrate (IMDUR) 120 MG 24 hr tablet Take 120 mg by mouth daily.     loratadine (CLARITIN) 10 MG tablet Take 10  mg by mouth in the morning.     Multiple Vitamin (MULTIVITAMIN ADULT) TABS Take 1 tablet by mouth in the morning.     nitroGLYCERIN (NITROSTAT) 0.4 MG SL tablet Place 1 tablet (0.4 mg total) under the tongue every 5 (five) minutes as needed for chest pain. 20 tablet 0   NOVOLOG FLEXPEN 100 UNIT/ML FlexPen Inject 5 Units into the skin 3 (three) times daily with meals.     Omega-3 Fatty Acids (FISH OIL) 1200 MG CAPS Take 1,200 mg by mouth in the morning.     pregabalin (LYRICA) 25 MG capsule Take 25 mg by mouth 2 (two) times daily.     Semaglutide, 2 MG/DOSE, (OZEMPIC, 2 MG/DOSE,) 8 MG/3ML SOPN Inject 2 mg into the skin once a week. 3 mL 6   sertraline (ZOLOFT) 50 MG tablet Take 50 mg by mouth in the morning.     spironolactone (ALDACTONE) 50 MG tablet Take 50 mg by mouth daily.     torsemide (DEMADEX) 20 MG tablet Take 1 tablet (20 mg total) by mouth daily as needed. (Patient taking differently: Take 20 mg by mouth daily as needed. Patient takes 1 tablet daily) 90 tablet 3   TURMERIC CURCUMIN PO Take 500 mg by mouth daily at 6 (six) AM.     LINZESS 145 MCG CAPS capsule TAKE 1 CAPSULE BY MOUTH DAILY BEFORE BREAKFAST. 30 capsule 11   Current Facility-Administered Medications  Medication Dose Route Frequency Provider Last Rate Last Admin   sodium phosphate (FLEET) 7-19 GM/118ML enema 1 enema  1 enema Rectal Once Danis, Andreas Blower, MD       Facility-Administered Medications Ordered in Other Visits  Medication Dose Route Frequency Provider Last Rate Last Admin   technetium pyrophosphate Tc 30m injection 21.2 millicurie  21.2 millicurie Intravenous Once Hilty, Lisette Abu, MD        Family History  Problem Relation Age of Onset   Hypertension Mother    Stroke Mother    Diabetes Mother    Other Mother        enlarged heart   Hypertension Father    Hypertension Sister    Diabetes Sister    CAD Sister        enlarged heart   Kidney disease Sister    Diabetes Paternal Grandmother     Hypertension Daughter    Diabetes Daughter    Hypertension Son    Diabetes Son    Breast cancer Paternal Aunt     ROS: Constitutional: negative Genitourinary:negative  Exam:   BP 133/60 (  BP Location: Right Arm, Patient Position: Sitting, Cuff Size: Large)   Pulse 74   Ht 5\' 3"  (1.6 m)   Wt 211 lb 9.6 oz (96 kg)   BMI 37.48 kg/m   Height: 5\' 3"  (160 cm)  General appearance: alert, cooperative and appears stated age Head: Normocephalic, without obvious abnormality, atraumatic Lungs: clear to auscultation bilaterally Breasts: normal appearance, no masses or tenderness, Inspection negative, No nipple retraction or dimpling, No nipple discharge or bleeding, No axillary or supraclavicular adenopathy, Normal to palpation without dominant masses Heart: regular rate and rhythm Abdomen: soft, non-tender; bowel sounds normal; no masses,  no organomegaly Extremities: extremities normal, atraumatic, no cyanosis or edema Skin: Skin color, texture, turgor normal. No rashes or lesions Lymph nodes: Cervical, supraclavicular, and axillary nodes normal. No abnormal inguinal nodes palpated Neurologic: Grossly normal   Pelvic: External genitalia:  no lesions              Urethra:  normal appearing urethra with no masses, tenderness or lesions              Bartholins and Skenes: normal                 Vagina: normal appearing vagina with normal color and no discharge, no lesions              Cervix: absent              Pap taken: No. Bimanual Exam:  Uterus:  uterus absent              Adnexa: no mass, fullness, tenderness               Rectovaginal: Confirms               Anus:  normal sphincter tone, no lesions  Chaperone, Greig Right, CMA, was present for exam.  Assessment/Plan: 1. Encounter for gynecological examination without abnormal finding - Continue annual screening mammograms - Pap smear not indicated per guidelines, Hysterectomy d/t benign disease - Healthy diet and routine  exercise encouraged  RTO one year for annual gyn exam and prn if issues arise. Letta Kocher

## 2023-05-16 DIAGNOSIS — Z419 Encounter for procedure for purposes other than remedying health state, unspecified: Secondary | ICD-10-CM | POA: Diagnosis not present

## 2023-05-19 ENCOUNTER — Other Ambulatory Visit: Payer: Self-pay | Admitting: Obstetrics and Gynecology

## 2023-05-19 DIAGNOSIS — I10 Essential (primary) hypertension: Secondary | ICD-10-CM | POA: Diagnosis not present

## 2023-05-19 DIAGNOSIS — N1832 Chronic kidney disease, stage 3b: Secondary | ICD-10-CM | POA: Diagnosis not present

## 2023-05-19 DIAGNOSIS — M797 Fibromyalgia: Secondary | ICD-10-CM | POA: Diagnosis not present

## 2023-05-19 DIAGNOSIS — Z23 Encounter for immunization: Secondary | ICD-10-CM | POA: Diagnosis not present

## 2023-05-19 DIAGNOSIS — M19012 Primary osteoarthritis, left shoulder: Secondary | ICD-10-CM | POA: Diagnosis not present

## 2023-05-19 DIAGNOSIS — E1122 Type 2 diabetes mellitus with diabetic chronic kidney disease: Secondary | ICD-10-CM | POA: Diagnosis not present

## 2023-05-19 NOTE — Patient Outreach (Signed)
Medicaid Managed Care   Nurse Care Manager Note  05/19/2023 Name:  Maria Hudson MRN:  [id number] DOB:  1960-09-06  Maria Hudson is an 62 y.o. year old female who is a primary patient of Maria Contras, MD.  The Medicaid Managed Care Coordination team was consulted for assistance with:    Chronic healthcare management needs, HTN, DM, chronic pain, retinopathy, depression, CHF, gout, OSA, neuropathy, h/o CVA, CKD, CAD  Maria Hudson was given information about Medicaid Managed Care Coordination team services today. Maria Hudson Patient agreed to services and verbal consent obtained.  [name] for follow up visit in response to provider referral for case management and/or care coordination services.   Assessments/Interventions:  Review of past medical history, allergies, medications, health status, including review of consultants reports, laboratory and other test data, was performed as part of comprehensive evaluation and provision of chronic care management services.  SDOH (Social Determinants of Health) assessments and interventions performed: SDOH Interventions    Flowsheet Row Patient Outreach Telephone from 05/19/2023 in [place] POPULATION HEALTH DEPARTMENT Patient Outreach Telephone from 04/06/2023 in [place] POPULATION HEALTH DEPARTMENT Patient Outreach Telephone from 02/08/2023 in [place] POPULATION HEALTH DEPARTMENT Patient Outreach Telephone from 01/08/2023 in [place] POPULATION HEALTH DEPARTMENT Patient Outreach Telephone from 12/09/2022 in [place] POPULATION HEALTH DEPARTMENT Patient Outreach Telephone from 11/05/2022 in  POPULATION HEALTH DEPARTMENT  SDOH Interventions        Food Insecurity Interventions -- -- -- -- -- Intervention Not Indicated  Housing Interventions -- -- -- -- Intervention Not Indicated --  Transportation Interventions -- -- -- Intervention Not Indicated -- --  Utilities Interventions -- -- Intervention Not  Indicated -- -- --  Alcohol Usage Interventions -- -- -- -- -- Intervention Not Indicated (Score <7)  Depression Interventions/Treatment  -- -- -- -- Counseling, Medication  [patient speaks with Maria Hudson every week] --  Financial Strain Interventions -- -- Intervention Not Indicated -- -- --  Physical Activity Interventions Other (Comments)  [not able to participate in this type of exercise] -- -- -- -- --  Stress Interventions Provide Counseling, Other (Comment)  [Maria Hudson] -- -- -- -- --  Social Connections Interventions -- Intervention Not Indicated -- -- -- --  Health Literacy Interventions -- -- -- Intervention Not Indicated -- --     Care Plan Allergies  Allergen Reactions   Metformin And Related Nausea Only   Other     Clonidine patch causes skin irritation    Hydrocodone Itching    Patient able to tolerate when taken with Benadryl.   Oxycodone-Acetaminophen Itching    Patient able to tolerate when taken with Benadryl.   Medications Reviewed Today     Reviewed by Maria Hudson (Registered Nurse) on 05/19/23 at 1023  Med List Status: <None>   Medication Order Taking? Sig Documenting Provider Last Dose Status Informant  Accu-Chek Softclix Lancets lancets [id number] No 3 (three) times daily. [provider] Taking Active Self  acetaminophen (TYLENOL) 500 MG tablet [id number] No Take 1,000 mg by mouth every 6 (six) hours as needed for moderate pain or headache. [provider] Taking Active Self  allopurinol (ZYLOPRIM) 100 MG tablet [id number] No Take 100 mg by mouth in the morning. [provider] Taking Active Self  amLODipine (NORVASC) 10 MG tablet [id number] No Take 10 mg by mouth daily. [provider] Taking Active   aspirin EC 81 MG tablet [id number] No Take 81 mg by mouth in  the morning. Swallow whole. [provider] Taking Active Self           Med Note Maria Hudson   Mon Apr 14, 2021  9:22 PM)     atorvastatin (LIPITOR) 40 MG tablet [id number] No Take 1 tablet (40 mg total) by mouth every evening. Maria Si, MD Taking Active Self  buPROPion [facility]) 100 MG tablet [id number] No Take 100 mg by mouth 2 (two) times daily. [provider] Taking Active Self  carvedilol (COREG) 25 MG tablet [id number] No Take 1 tablet (25 mg total) by mouth 2 (two) times daily with a meal. Maria Hudson, Maria Ram, MD Taking Active Self  cholecalciferol (VITAMIN D3) 25 MCG (1000 UNIT) tablet [id number] No Take 2,000 Units by mouth daily. [provider] Taking Active Self  cloNIDine (CATAPRES) 0.2 MG tablet [id number] No TAKE 2 TABLETS (0.4 MG TOTAL) BY MOUTH 3 (THREE) TIMES DAILY. Maria Hudson, Maria Hudson Taking Active   Continuous Blood Gluc Transmit (DEXCOM G6 TRANSMITTER) MISC [id number] No Use one every 3 month [provider] Taking Active   dapagliflozin propanediol (FARXIGA) 10 MG TABS tablet [id number] No TAKE 1 TABLET BY MOUTH DAILY BEFORE BREAKFAST. Maria Hudson, Maria Hudson Taking Active   ferrous sulfate 325 (65 FE) MG tablet [id number] No Take 325 mg by mouth 2 (two) times daily. [provider] Taking Active Self  fluticasone (FLONASE) 50 MCG/ACT nasal spray [id number] No Place 1 spray into both nostrils daily as needed for allergies or rhinitis. [provider] Taking Active Self  hydrALAZINE (APRESOLINE) 100 MG tablet [id number] No TAKE 1 TABLET BY MOUTH 3 TIMES DAILY. Maria Si, MD Taking Active   insulin glargine (LANTUS) 100 unit/mL SOPN [id number] No Inject 10 Units into the skin at bedtime. [provider] Taking Active Self           Med Note Maria Hudson, Maria Hudson   Tue Oct 13, 2022  9:23 AM)    isosorbide mononitrate (IMDUR) 120 MG 24 hr tablet [id number] No Take 120 mg by mouth daily. [provider] Taking Active   LINZESS 145 MCG CAPS capsule [id number]  TAKE 1 CAPSULE BY MOUTH DAILY BEFORE BREAKFAST. Zehr, Princella Pellegrini, PA-C  Active   loratadine (CLARITIN) 10 MG tablet [id number] No Take 10 mg by mouth in the morning. [provider] Taking Active Self  Multiple Vitamin (MULTIVITAMIN ADULT) TABS [id number] No Take 1 tablet by mouth in the morning. [provider] Taking Active Self  nitroGLYCERIN (NITROSTAT) 0.4 MG SL tablet [id number] No Place 1 tablet (0.4 mg total) under the tongue every 5 (five) minutes as needed for chest pain. Maria Hudson, Maria Ram, MD Taking Active Self           Med Note Kittie Plater, Smitty Cords   Tue Aug 19, 2021  9:35 AM)    Rubbie Battiest FLEXPEN 100 UNIT/ML FlexPen [id number] No Inject 5 Units into the skin 3 (three) times daily with meals. [provider] Taking Active Self           Med Note Maria Hudson, Maria Hudson   Tue Oct 13, 2022  9:23 AM)    Omega-3 Fatty Acids (FISH OIL) 1200 MG CAPS [id number] No Take 1,200 mg by mouth in the morning. [provider] Taking Active Self  pregabalin (LYRICA) 25 MG capsule [id number] No Take 25 mg by mouth 2 (two) times daily. [provider] Taking Active Self  Semaglutide, 2 MG/DOSE, (OZEMPIC, 2 MG/DOSE,) 8 MG/3ML SOPN [id number] No Inject 2 mg  into the skin once a week. Pavero, Cristal Deer, [facility] Taking Active   sertraline (ZOLOFT) 50 MG tablet [id number] No Take 50 mg by mouth in the morning. [provider] Taking Active Self  sodium phosphate (Maria) 7-19 GM/118ML enema 1 enema [id number]   Sherrilyn Rist, MD  Active   spironolactone (ALDACTONE) 50 MG tablet [id number] No Take 50 mg by mouth daily. [provider] Taking Active   torsemide (DEMADEX) 20 MG tablet [id number] No Take 1 tablet (20 mg total) by mouth daily as needed.  Patient taking differently: Take 20 mg by mouth daily as needed. Patient takes 1 tablet daily   [place], Maria Hudson, [place] Taking Active   TURMERIC CURCUMIN PO [id number] No Take 500 mg by mouth daily at 6 (six) AM. [provider] Taking Active             Patient Active Problem List   Diagnosis Date Noted   History of colonic polyps 12/28/2022   Chronic constipation 03/31/2022   Pain due to onychomycosis of toenails of both feet 12/05/2021   Pure hypercholesterolemia 05/27/2021   Chronic diastolic CHF (congestive heart failure) (HCC) 04/29/2021   Hypertensive urgency 04/14/2021   Acute on chronic diastolic CHF (congestive heart failure) (HCC) 04/14/2021   Chest pain 03/05/2021   CAD (coronary artery disease) 03/05/2021   T2DM (type 2 diabetes mellitus) (HCC) 03/05/2021   CKD (chronic kidney disease) stage 4, GFR 15-29 ml/min (HCC) 03/05/2021   Class 3 obesity 03/05/2021   CVA (cerebral vascular accident) (HCC) 03/05/2021   Acute on chronic diastolic heart failure (HCC) 03/05/2021   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   AKI (acute kidney injury) (HCC) 03/05/2021   Vitreous hemorrhage of left eye (HCC) 11/14/2020   Orthopnea 08/06/2020   Vision loss of right eye 08/06/2020   Excessive daytime sleepiness 01/11/2020   Iron deficiency anemia 12/14/2019   Vitreous hemorrhage, right (HCC) 12/04/2019   Diabetic neuropathy (HCC) 07/26/2019   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) 02/03/2019   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   Conductive hearing loss, bilateral 03/03/2018   Chronic bilateral low back pain without sciatica 08/21/2015   Chronic kidney disease (CKD) stage G3b/A3, moderately decreased glomerular filtration rate (GFR) between 30-44 mL/min/1.73 square meter and albuminuria creatinine ratio greater than 300 mg/g (HCC) 04/30/2015   Type 2 diabetes mellitus with diabetic polyneuropathy, with long-term current use of insulin (HCC) 02/15/2015   Advance directive on file 05/17/2014   Refusal of blood transfusions as patient is Jehovah's Witness 05/17/2014   OSA treated with BiPAP 02/23/2014   Coronary artery disease 07/07/2013   Severe obesity (BMI >=  40) (HCC) 05/18/2013   Neuropathy 04/11/2013   Diastolic heart failure (HCC) 10/16/2012   Essential hypertension 10/16/2012   Conditions to be addressed/monitored per PCP order:  Chronic healthcare management needs, HTN, DM, chronic pain, retinopathy, depression, CHF, gout, OSA, neuropathy, h/o CVA, CKD, CAD  Care Plan : [facility] Care Manager Plan Of Care  Updates made by Maria Hudson since 05/19/2023 12:00 AM     Problem: Knowledge Deficit and Care Coordination Needs Related to Management of HTN, HF, Type 2 DM   Priority: High     Long-Range Goal: Development of Plan of Care to Address Knowledge Deficits and Care Coordination Needs related to  management of CHF, CAD, HTN, HLD, DMII, CKD Stage 4, and Depression   Start Date: 05/06/2021  Expected End Date: 07/07/2023  Priority: High  Note:   Current Barriers:  Knowledge Deficits related to plan of care for management of CHF, CAD, HTN, HLD, DMII, CKD Stage 4, and Depression 05/19/23: Limited conversation with patient today as she is at PCP appt for pain on left side.  To r/s appts missed d/t pain.  RNCM Clinical Goal(s):  Patient will demonstrate ongoing adherence to prescribed treatment plan for CHF, CAD, HTN, HLD, DMII, CKD Stage 4, and Depression as evidenced by patient report of improved health and quality of life and no unplanned ED or unplanned hospital admissions continue to work with [facility] Care Manager and/or Social Worker to address care management and care coordination needs related to CHF, CAD, HTN, HLD, DMII, CKD Stage 4, and Depression as evidenced by adherence to CM Team Scheduled appointments      Work with [facility] LCSW and psychiatrist to treat depression  Interventions: Inter-disciplinary care team collaboration (see longitudinal plan of care) Evaluation of current treatment plan related to  self management and patient's adherence to plan as established by provider Mailed summary of benefits for her [facility] Managed Medicaid  plan to her home address Collaborated with Care Guide for dental resources-completed. Care Guide referral for dental resources Collaborated with BSW for assistance with [facility] appt.-completed. BSW referral for appt assistance. Collaborated with Pharmacy regarding  uncontrolled HTN Pharmacy referral for uncontrolled HTN-completed. Collaborated with LCSW for depression LCSW referral for depression-completed BSW referral for dental and PCP resources-completed. Collaborated with BSW  CAD Interventions: (Status:  New goal.) Long Term Goal Assessed understanding of CAD diagnosis Medications reviewed including medications utilized in CAD treatment plan Provided education on importance of blood pressure control in management of CAD Counseled on importance of regular laboratory monitoring as prescribed Reviewed Importance of taking all medications as prescribed Reviewed Importance of attending all scheduled provider appointments Advised to report any changes in symptoms or exercise tolerance Assessed social determinant of health barriers  Hyperlipidemia Interventions:  (Status:  New goal.) Long Term Goal Medication review performed; medication list updated in electronic medical record.  Provider established cholesterol goals reviewed Counseled on importance of regular laboratory monitoring as prescribed Reviewed importance of limiting foods high in cholesterol Assessed social determinant of health barriers     Heart Failure Interventions:  (Status: Goal on Track (progressing): YES.)  Long Term Goal - patient states her fluid retention has significantly improved,  says she cannot tolerate the compression stockings because they are too tight and make her legs hurt Assessed patient's heart failure management strategies  Diabetes:  (Status: Goal on Track (progressing): YES.) Long Term Goal   Lab Results  Component Value Date   HGBA1C 6.7 (H) 04/29/2021   Hgn A1C=6.3 on 12/26/21 Hgb  A1C=5.2 in October per patient HGBA1C=6.2 on 02/22/23  Assessed patient's understanding of A1C goal: <7% Reviewed medications with patient and discussed importance of medication adherence;        Counseled on importance of regular laboratory monitoring as prescribed;        Discussed plans with patient for ongoing care management follow up and provided patient with direct contact information for care management team;      Advised patient, providing education and rationale, to check cbg three times daily and record        call provider for findings outside established parameters;       Review of patient status, including review of consultants reports, relevant laboratory and other test results, and medications completed;       Discussed  addition of Ozempic to medication regimen , reviewed mechanism of action and proven cardiovascular benefits To schedule ENDO appt-discussed nutrition referral, [facility]-ENDO appt scheduled-patient declines referral.  Hypertension and CKD stage 4 : (Status: Goal on Track (progressing): YES.) Long Term Goal-  has been placed on kidney transplant waiting list, fistu;a placed 12/09/21,ready for use 03/11/22.  BP Readings from Last 3 Encounters:        02/02/23 164/70  04/06/23          144/75 01/08/23         126-178/66-85    Most recent eGFR/CrCl:    No components found for: CRCL Lab Results  Component Value Date   NA 136 08/19/2021   K 3.9 08/19/2021   CREATININE 2.51 (H) 08/19/2021   EGFR 19 (L) 03/13/2021   GFRNONAA 22 (L) 08/19/2021   GLUCOSE 122 (H) 08/19/2021    Evaluation of current treatment plan related to hypertension self management and patient's adherence to plan as established by provider;   Reviewed medications with patient and discussed importance of compliance;  Discussed plans with patient for ongoing care management follow up and provided patient with direct contact information for care management  team; Advised patient, providing education and rationale, to monitor blood pressure daily and record, calling PCP for findings outside established parameters;  Reviewed scheduled/upcoming provider appointments   Weight Loss Interventions:  (Status:  New goal.) Long Term Goal--patient has started Ozempic Discussed indications, mechanism of action, common side effects and CV benefits  of GLP1 Ozempic Assessed tolerance of Ozempic  Interdisciplinary Collaboration:  (Status: Goal on Track (progressing): YES.) Long Term Goal  Collaborated with BSW to initiate plan of care to address needs related to Housing barriers, Inability to perform ADL's independently, and Inability to perform IADL's independently in patient with CHF, CAD, HTN, HLD, DM, CKD Stage 4, and Depression Assessed status of patient's involvement with behavioral health counselor Collaboration with pharmacist for medication review, discussion of ? Medication side effects causing chronic constipation and arranging for patient to received home delivery of medications in blister packs  Patient Goals/Self-Care Activities: Take medications as prescribed   Attend all scheduled provider appointments Call pharmacy for medication refills 3-7 days in advance of running out of medications Attend church or other social activities Perform all self care activities independently  Call provider office for new concerns or questions  Work with the social worker to address care coordination needs and will continue to work with the clinical team to address health care and disease management related needs call office if I gain more than 2 pounds in one day or 5 pounds in one week keep legs up while sitting track weight in diary use salt in moderation watch for swelling in feet, ankles and legs every day weigh myself daily know when to call the doctorfor fluid weight gain or consistently abnormal BP readings at home track symptoms and what helps feel  better or worse check blood sugar at prescribed times: three times daily enter blood sugar readings and medication or insulin into daily log take the blood sugar log to all doctor visits take the blood sugar meter to all doctor visits eat fish at least once per week fill half of plate with vegetables manage portion size check blood pressure daily write blood pressure results in a log or diary learn about high blood pressure take blood pressure log to all doctor appointments call doctor for signs and symptoms of high blood pressure keep all  doctor appointments take medications for blood pressure exactly as prescribed Limit fluid intake to 1 liter per day Collaborate with managed Medicaid team of BSW and pharmacist to address identified needs Work with cardiology pharmacist to assist with BP stabilization Work with [facility] LCSW and psychiatrist to treat depression Review summary of [employer] benefits mailed to home address and call to enroll in program that will benefit your chronic health issues Continue to use [facility] Care Management calendar to record weight , BP and CBGs so readings are all in one place Schedule dental appt Schedule ENDO appt-completed. Patient to follow up on ENT referral-completed To schedule missed appts d/t pain.   Long-Range Goal: Establish Plan of Care for Chronic Disease Management Needs   Priority: High  Note:   Timeframe:  Long-Range Goal Priority:  High Start Date:     11/12/21                        Expected End Date:  ongoing                     Follow Up Date: 06/17/23   - schedule appointment for vaccines needed due to my age or health - schedule recommended health tests (blood work, mammogram, colonoscopy, pap test) - schedule and keep appointment for annual check-up    Why is this important?   Screening tests can find diseases early when they are easier to treat.  Your doctor or nurse will talk with you about which tests are  important for you.  Getting shots for common diseases like the flu and shingles will help prevent them.  05/19/23:  PCP appt today, to r/s other appts missed d/t pain   Follow Up:  Patient agrees to Care Plan and Follow-up.  Plan: The Managed Medicaid care management team will reach out to the patient again over the next 30 business  days. and The  Patient has been provided with contact information for the Managed Medicaid care management team and has been advised to call with any health related questions or concerns.  Date/time of next scheduled Hudson care management/care coordination outreach: 06/17/23 at 1230

## 2023-05-19 NOTE — Patient Instructions (Signed)
Hi [name], I hope you feel better!  Thank you for updating me!  [name] was given information about Medicaid Managed Care team care coordination services as a part of their [facility] Medicaid benefit. Maria Hudson verbally consented to engagement with the  [facility] Managed Care team.   If you are experiencing a medical emergency, please call 911 or report to your local emergency department or urgent care.   If you have a non-emergency medical problem during routine business hours, please contact your provider's office and ask to speak with a nurse.   For questions related to your [facility] health plan, please call: [phone] or go here:[email or web address]  If you would like to schedule transportation through your [facility] plan, please call the following number at least 2 days in advance of your appointment: [phone].   You can also use the MTM portal or MTM mobile app to manage your rides. Reimbursement for transportation is available through [facility]! For the portal, please go to [email or web address]  Call the [facility] Crisis Line at [phone], at any time, 24 hours a day, 7 days a week. If you are in danger or need immediate medical attention call 911.  If you would like help to quit smoking, call 1-800-QUIT-NOW (610-667-9023) OR Espaol: 1-855-Djelo-Ya (7-425-956-3875) o para ms informacin haga clic aqu or Text READY to 643-329 to register via text  [name] - following are the goals we discussed in your visit today:   Goals Addressed    Timeframe:  Long-Range Goal Priority:  High Start Date:     11/12/21                        Expected End Date:  ongoing                     Follow Up Date: 06/17/23   - schedule appointment for vaccines needed due to my age or health - schedule recommended health tests (blood work, mammogram, colonoscopy, pap test) - schedule and keep appointment for annual check-up    Why is this important?    Screening tests can find diseases early when they are easier to treat.  Your doctor or nurse will talk with you about which tests are important for you.  Getting shots for common diseases like the flu and shingles will help prevent them.  05/19/23:  PCP appt today, to r/s other appts missed d/t pain  Patient verbalizes understanding of instructions and care plan provided today and agrees to view in MyChart. Active MyChart status and patient understanding of how to access instructions and care plan via MyChart confirmed with patient.     The Managed Medicaid care management team will reach out to the patient again over the next 30 business  days.  The  Patient  has been provided with contact information for the Managed Medicaid care management team and has been advised to call with any health related questions or concerns.   Kathi Der RN, BSN [place]  [facility] Care Management Coordinator - Managed Medicaid High Risk [phone]   Following is a copy of your plan of care:  Care Plan : RN Care Manager Plan Of Care  Updates made by [name], RN since 05/19/2023 12:00 AM     Problem: Knowledge Deficit and Care Coordination Needs Related to Management of HTN, HF, Type 2 DM   Priority: High     Long-Range Goal: Development of Plan of  Care to Address Knowledge Deficits and Care Coordination Needs related to  management of CHF, CAD, HTN, HLD, DMII, CKD Stage 4, and Depression   Start Date: 05/06/2021  Expected End Date: 07/07/2023  Priority: High  Note:   Current Barriers:  Knowledge Deficits related to plan of care for management of CHF, CAD, HTN, HLD, DMII, CKD Stage 4, and Depression 05/19/23: Limited conversation with patient today as she is at PCP appt for pain on left side.  To r/s appts missed d/t pain.  RNCM Clinical Goal(s):  Patient will demonstrate ongoing adherence to prescribed treatment plan for CHF, CAD, HTN, HLD, DMII, CKD Stage 4, and Depression  as evidenced by patient report of improved health and quality of life and no unplanned ED or unplanned hospital admissions continue to work with RN Care Manager and/or Social Worker to address care management and care coordination needs related to CHF, CAD, HTN, HLD, DMII, CKD Stage 4, and Depression as evidenced by adherence to CM Team Scheduled appointments      Work with [facility] LCSW and psychiatrist to treat depression  Interventions: Inter-disciplinary care team collaboration (see longitudinal plan of care) Evaluation of current treatment plan related to  self management and patient's adherence to plan as established by provider Mailed summary of benefits for her [facility] Managed Medicaid plan to her home address Collaborated with Care Guide for dental resources-completed. Care Guide referral for dental resources Collaborated with BSW for assistance with [facility] appt.-completed. BSW referral for appt assistance. Collaborated with Pharmacy regarding  uncontrolled HTN Pharmacy referral for uncontrolled HTN-completed. Collaborated with LCSW for depression LCSW referral for depression-completed BSW referral for dental and PCP resources-completed. Collaborated with BSW  CAD Interventions: (Status:  New goal.) Long Term Goal Assessed understanding of CAD diagnosis Medications reviewed including medications utilized in CAD treatment plan Provided education on importance of blood pressure control in management of CAD Counseled on importance of regular laboratory monitoring as prescribed Reviewed Importance of taking all medications as prescribed Reviewed Importance of attending all scheduled provider appointments Advised to report any changes in symptoms or exercise tolerance Assessed social determinant of health barriers  Hyperlipidemia Interventions:  (Status:  New goal.) Long Term Goal Medication review performed; medication list updated in electronic medical record.  Provider  established cholesterol goals reviewed Counseled on importance of regular laboratory monitoring as prescribed Reviewed importance of limiting foods high in cholesterol Assessed social determinant of health barriers     Heart Failure Interventions:  (Status: Goal on Track (progressing): YES.)  Long Term Goal - patient states her fluid retention has significantly improved,  says she cannot tolerate the compression stockings because they are too tight and make her legs hurt Assessed patient's heart failure management strategies  Diabetes:  (Status: Goal on Track (progressing): YES.) Long Term Goal   Lab Results  Component Value Date   HGBA1C 6.7 (H) 04/29/2021   Hgn A1C=6.3 on 12/26/21 Hgb A1C=5.2 in October per patient HGBA1C=6.2 on 02/22/23  Assessed patient's understanding of A1C goal: <7% Reviewed medications with patient and discussed importance of medication adherence;        Counseled on importance of regular laboratory monitoring as prescribed;        Discussed plans with patient for ongoing care management follow up and provided patient with direct contact information for care management team;      Advised patient, providing education and rationale, to check cbg three times daily and record        call provider for  findings outside established parameters;       Review of patient status, including review of consultants reports, relevant laboratory and other test results, and medications completed;       Discussed addition of Ozempic to medication regimen , reviewed mechanism of action and proven cardiovascular benefits To schedule ENDO appt-discussed nutrition referral, [facility]-ENDO appt scheduled-patient declines referral.  Hypertension and CKD stage 4 : (Status: Goal on Track (progressing): YES.) Long Term Goal-  has been placed on kidney transplant waiting list, fistu;a placed 12/09/21,ready for use 03/11/22.  BP Readings from Last 3 Encounters:         02/02/23 164/70  04/06/23          144/75 01/08/23         126-178/66-85    Most recent eGFR/CrCl:    No components found for: CRCL Lab Results  Component Value Date   NA 136 08/19/2021   K 3.9 08/19/2021   CREATININE 2.51 (H) 08/19/2021   EGFR 19 (L) 03/13/2021   GFRNONAA 22 (L) 08/19/2021   GLUCOSE 122 (H) 08/19/2021    Evaluation of current treatment plan related to hypertension self management and patient's adherence to plan as established by provider;   Reviewed medications with patient and discussed importance of compliance;  Discussed plans with patient for ongoing care management follow up and provided patient with direct contact information for care management team; Advised patient, providing education and rationale, to monitor blood pressure daily and record, calling PCP for findings outside established parameters;  Reviewed scheduled/upcoming provider appointments   Weight Loss Interventions:  (Status:  New goal.) Long Term Goal--patient has started Ozempic Discussed indications, mechanism of action, common side effects and CV benefits  of GLP1 Ozempic Assessed tolerance of Ozempic  Interdisciplinary Collaboration:  (Status: Goal on Track (progressing): YES.) Long Term Goal  Collaborated with BSW to initiate plan of care to address needs related to Housing barriers, Inability to perform ADL's independently, and Inability to perform IADL's independently in patient with CHF, CAD, HTN, HLD, DM, CKD Stage 4, and Depression Assessed status of patient's involvement with behavioral health counselor Collaboration with pharmacist for medication review, discussion of ? Medication side effects causing chronic constipation and arranging for patient to received home delivery of medications in blister packs  Patient Goals/Self-Care Activities: Take medications as prescribed   Attend all scheduled provider appointments Call pharmacy for medication refills 3-7 days in advance of running  out of medications Attend church or other social activities Perform all self care activities independently  Call provider office for new concerns or questions  Work with the social worker to address care coordination needs and will continue to work with the clinical team to address health care and disease management related needs call office if I gain more than 2 pounds in one day or 5 pounds in one week keep legs up while sitting track weight in diary use salt in moderation watch for swelling in feet, ankles and legs every day weigh myself daily know when to call the doctorfor fluid weight gain or consistently abnormal BP readings at home track symptoms and what helps feel better or worse check blood sugar at prescribed times: three times daily enter blood sugar readings and medication or insulin into daily log take the blood sugar log to all doctor visits take the blood sugar meter to all doctor visits eat fish at least once per week fill half of plate with vegetables manage portion size check blood pressure  daily write blood pressure results in a log or diary learn about high blood pressure take blood pressure log to all doctor appointments call doctor for signs and symptoms of high blood pressure keep all doctor appointments take medications for blood pressure exactly as prescribed Limit fluid intake to 1 liter per day Collaborate with managed Medicaid team of BSW and pharmacist to address identified needs Work with cardiology pharmacist to assist with BP stabilization Work with [facility] LCSW and psychiatrist to treat depression Review summary of [employer] benefits mailed to home address and call to enroll in program that will benefit your chronic health issues Continue to use [facility] Care Management calendar to record weight , BP and CBGs so readings are all in one place Schedule dental appt Schedule ENDO appt-completed. Patient to follow up on ENT  referral-completed To schedule missed appts d/t pain.   Long-Range Goal: Establish Plan of Care for Chronic Disease Management Needs   Priority: High  Note:   Timeframe:  Long-Range Goal Priority:  High Start Date:     11/12/21                        Expected End Date:  ongoing                     Follow Up Date: 06/17/23   - schedule appointment for vaccines needed due to my age or health - schedule recommended health tests (blood work, mammogram, colonoscopy, pap test) - schedule and keep appointment for annual check-up    Why is this important?   Screening tests can find diseases early when they are easier to treat.  Your doctor or nurse will talk with you about which tests are important for you.  Getting shots for common diseases like the flu and shingles will help prevent them.  05/19/23:  PCP appt today, to r/s other appts missed d/t pain

## 2023-05-19 NOTE — Telephone Encounter (Signed)
Change PAP setting from 12-18 to 12-16 per Dr. Tresa Endo.

## 2023-05-20 ENCOUNTER — Ambulatory Visit: Payer: Medicaid Other | Admitting: Orthopaedic Surgery

## 2023-05-25 DIAGNOSIS — E1122 Type 2 diabetes mellitus with diabetic chronic kidney disease: Secondary | ICD-10-CM | POA: Diagnosis not present

## 2023-05-25 DIAGNOSIS — I120 Hypertensive chronic kidney disease with stage 5 chronic kidney disease or end stage renal disease: Secondary | ICD-10-CM | POA: Diagnosis not present

## 2023-05-25 DIAGNOSIS — I5032 Chronic diastolic (congestive) heart failure: Secondary | ICD-10-CM | POA: Diagnosis not present

## 2023-05-25 DIAGNOSIS — N2581 Secondary hyperparathyroidism of renal origin: Secondary | ICD-10-CM | POA: Diagnosis not present

## 2023-05-25 DIAGNOSIS — N185 Chronic kidney disease, stage 5: Secondary | ICD-10-CM | POA: Diagnosis not present

## 2023-05-31 DIAGNOSIS — I132 Hypertensive heart and chronic kidney disease with heart failure and with stage 5 chronic kidney disease, or end stage renal disease: Secondary | ICD-10-CM | POA: Diagnosis not present

## 2023-05-31 DIAGNOSIS — E66812 Obesity, class 2: Secondary | ICD-10-CM | POA: Diagnosis not present

## 2023-05-31 DIAGNOSIS — E669 Obesity, unspecified: Secondary | ICD-10-CM | POA: Diagnosis not present

## 2023-05-31 DIAGNOSIS — E162 Hypoglycemia, unspecified: Secondary | ICD-10-CM | POA: Diagnosis not present

## 2023-05-31 DIAGNOSIS — Z794 Long term (current) use of insulin: Secondary | ICD-10-CM | POA: Diagnosis not present

## 2023-05-31 DIAGNOSIS — E7800 Pure hypercholesterolemia, unspecified: Secondary | ICD-10-CM | POA: Diagnosis not present

## 2023-05-31 DIAGNOSIS — Z8673 Personal history of transient ischemic attack (TIA), and cerebral infarction without residual deficits: Secondary | ICD-10-CM | POA: Diagnosis not present

## 2023-05-31 DIAGNOSIS — E1122 Type 2 diabetes mellitus with diabetic chronic kidney disease: Secondary | ICD-10-CM | POA: Diagnosis not present

## 2023-05-31 DIAGNOSIS — I2510 Atherosclerotic heart disease of native coronary artery without angina pectoris: Secondary | ICD-10-CM | POA: Diagnosis not present

## 2023-05-31 DIAGNOSIS — I10 Essential (primary) hypertension: Secondary | ICD-10-CM | POA: Diagnosis not present

## 2023-05-31 DIAGNOSIS — D3502 Benign neoplasm of left adrenal gland: Secondary | ICD-10-CM | POA: Diagnosis not present

## 2023-05-31 DIAGNOSIS — I5032 Chronic diastolic (congestive) heart failure: Secondary | ICD-10-CM | POA: Diagnosis not present

## 2023-05-31 DIAGNOSIS — R5381 Other malaise: Secondary | ICD-10-CM | POA: Diagnosis not present

## 2023-05-31 DIAGNOSIS — Z6837 Body mass index (BMI) 37.0-37.9, adult: Secondary | ICD-10-CM | POA: Diagnosis not present

## 2023-05-31 DIAGNOSIS — N184 Chronic kidney disease, stage 4 (severe): Secondary | ICD-10-CM | POA: Diagnosis not present

## 2023-05-31 DIAGNOSIS — E1121 Type 2 diabetes mellitus with diabetic nephropathy: Secondary | ICD-10-CM | POA: Diagnosis not present

## 2023-05-31 DIAGNOSIS — E11319 Type 2 diabetes mellitus with unspecified diabetic retinopathy without macular edema: Secondary | ICD-10-CM | POA: Diagnosis not present

## 2023-05-31 DIAGNOSIS — N186 End stage renal disease: Secondary | ICD-10-CM | POA: Diagnosis not present

## 2023-05-31 DIAGNOSIS — R7309 Other abnormal glucose: Secondary | ICD-10-CM | POA: Diagnosis not present

## 2023-05-31 DIAGNOSIS — E1142 Type 2 diabetes mellitus with diabetic polyneuropathy: Secondary | ICD-10-CM | POA: Diagnosis not present

## 2023-05-31 DIAGNOSIS — I5030 Unspecified diastolic (congestive) heart failure: Secondary | ICD-10-CM | POA: Diagnosis not present

## 2023-05-31 DIAGNOSIS — Z992 Dependence on renal dialysis: Secondary | ICD-10-CM | POA: Diagnosis not present

## 2023-06-01 ENCOUNTER — Telehealth (HOSPITAL_COMMUNITY): Payer: Self-pay | Admitting: Cardiology

## 2023-06-01 NOTE — Telephone Encounter (Signed)
She does not qualify for cardiac rehab.  Would see if we can get her in the [facility] PREP program.

## 2023-06-01 NOTE — Telephone Encounter (Signed)
Pt called to report she was recently seen by kidney transplant team, reports work up went well however was told she was took weak.   Needs to work on getting stronger, advised to contact cardiology for cardiac rehab referral.  Please review

## 2023-06-02 DIAGNOSIS — I509 Heart failure, unspecified: Secondary | ICD-10-CM | POA: Diagnosis not present

## 2023-06-02 DIAGNOSIS — M609 Myositis, unspecified: Secondary | ICD-10-CM | POA: Diagnosis not present

## 2023-06-02 DIAGNOSIS — N1832 Chronic kidney disease, stage 3b: Secondary | ICD-10-CM | POA: Diagnosis not present

## 2023-06-02 DIAGNOSIS — E1122 Type 2 diabetes mellitus with diabetic chronic kidney disease: Secondary | ICD-10-CM | POA: Diagnosis not present

## 2023-06-02 DIAGNOSIS — I10 Essential (primary) hypertension: Secondary | ICD-10-CM | POA: Diagnosis not present

## 2023-06-03 DIAGNOSIS — G4733 Obstructive sleep apnea (adult) (pediatric): Secondary | ICD-10-CM | POA: Diagnosis not present

## 2023-06-04 DIAGNOSIS — D3502 Benign neoplasm of left adrenal gland: Secondary | ICD-10-CM | POA: Diagnosis not present

## 2023-06-04 DIAGNOSIS — R7309 Other abnormal glucose: Secondary | ICD-10-CM | POA: Diagnosis not present

## 2023-06-04 NOTE — Telephone Encounter (Signed)
Pt aware  Unable to send for [employer] prep as [facility] insurance is needed    Pt appreciative of returned call

## 2023-06-14 ENCOUNTER — Encounter: Payer: Self-pay | Admitting: Gastroenterology

## 2023-06-15 ENCOUNTER — Other Ambulatory Visit: Payer: Self-pay | Admitting: Pharmacist

## 2023-06-16 DIAGNOSIS — Z419 Encounter for procedure for purposes other than remedying health state, unspecified: Secondary | ICD-10-CM | POA: Diagnosis not present

## 2023-06-17 ENCOUNTER — Other Ambulatory Visit: Payer: Self-pay | Admitting: Obstetrics and Gynecology

## 2023-06-17 NOTE — Patient Outreach (Signed)
 Medicaid Managed Care   Nurse Care Manager Note  06/17/2023 Name:  Maria Hudson MRN:  [id number] DOB:  30-Oct-1960  Maria Hudson is an 63 y.o. year old female who is a primary patient of Maria Atlas, MD.  The Medicaid Managed Care Coordination team was consulted for assistance with:    Chronic healthcare management needs, HTN, DM, chronic pain, retinopathy, depression, CHF, gout, OSA, neuropathy, h/o CVA, CKD, CAD  [name] was given information about Medicaid Managed Care Coordination team services today. [name] Patient agreed to services and verbal consent obtained.  [name] for follow up visit in response to provider referral for case management and/or care coordination services.   Patient is participating in a Managed Medicaid Plan:  Yes  Assessments/Interventions:  Review of past medical history, allergies, medications, health status, including review of consultants reports, laboratory and other test data, was performed as part of comprehensive evaluation and provision of chronic care management services.  SDOH (Social Drivers of Health) assessments and interventions performed: SDOH Interventions    Flowsheet Row Patient Outreach Telephone from 06/17/2023 in [place] POPULATION HEALTH DEPARTMENT Patient Outreach Telephone from 05/19/2023 in [place] POPULATION HEALTH DEPARTMENT Patient Outreach Telephone from 04/06/2023 in [place] POPULATION HEALTH DEPARTMENT Patient Outreach Telephone from 02/08/2023 in [place] POPULATION HEALTH DEPARTMENT Patient Outreach Telephone from 01/08/2023 in [place] POPULATION HEALTH DEPARTMENT Patient Outreach Telephone from 12/09/2022 in  POPULATION HEALTH DEPARTMENT  SDOH Interventions        Housing Interventions Intervention Not Indicated -- -- -- -- Intervention Not Indicated  Transportation Interventions -- -- -- -- Intervention Not Indicated --  Utilities Interventions -- -- -- Intervention  Not Indicated -- --  Alcohol Usage Interventions Intervention Not Indicated (Score <7) -- -- -- -- --  Depression Interventions/Treatment  -- -- -- -- -- Counseling, Medication  [patient speaks with [facility] every week]  Financial Strain Interventions -- -- -- Intervention Not Indicated -- --  Physical Activity Interventions -- Other (Comments)  [not able to participate in this type of exercise] -- -- -- --  Stress Interventions -- Provide Counseling, Other (Comment)  [[facility]] -- -- -- --  Social Connections Interventions -- -- Intervention Not Indicated -- -- --  Health Literacy Interventions -- -- -- -- Intervention Not Indicated --     Care Plan Allergies  Allergen Reactions   Metformin And Related Nausea Only   Other     Clonidine  patch causes skin irritation    Hydrocodone Itching    Patient able to tolerate when taken with Benadryl.   Oxycodone -Acetaminophen  Itching    Patient able to tolerate when taken with Benadryl.    Medications Reviewed Today     Reviewed by Milon Veva MATSU, RN (Registered Nurse) on 06/17/23 at 1233  Med List Status: <None>   Medication Order Taking? Sig Documenting Provider Last Dose Status Informant  Accu-Chek Softclix Lancets lancets [id number] No 3 (three) times daily. [provider] Taking Active Self  acetaminophen  (TYLENOL ) 500 MG tablet [id number] No Take 1,000 mg by mouth every 6 (six) hours as needed for moderate pain or headache. [provider] Taking Active Self  allopurinol  (ZYLOPRIM ) 100 MG tablet [id number] No Take 100 mg by mouth in the morning. [provider] Taking Active Self  amLODipine  (NORVASC ) 10 MG tablet [id number] No Take 10 mg by mouth daily. [provider] Taking Active   aspirin  EC 81 MG tablet [id number] No Take 81 mg by  mouth in the morning. Swallow whole. [provider] Taking Active Self           Med Note DINO OVERCAST   Mon Apr 14, 2021  9:22 PM)     atorvastatin  (LIPITOR) 40 MG tablet [id number] No Take 1 tablet (40 mg total) by mouth every evening. Raford Riggs, MD Taking Active Self  buPROPion  (WELLBUTRIN ) 100 MG tablet [id number] No Take 100 mg by mouth 2 (two) times daily. [provider] Taking Active Self  carvedilol  (COREG ) 25 MG tablet [id number] No Take 1 tablet (25 mg total) by mouth 2 (two) times daily with a meal. [name], Elidia Sieving, MD Taking Active Self  cholecalciferol (VITAMIN D3) 25 MCG (1000 UNIT) tablet [id number] No Take 2,000 Units by mouth daily. [provider] Taking Active Self  cloNIDine  (CATAPRES ) 0.2 MG tablet [id number] No TAKE 2 TABLETS (0.4 MG TOTAL) BY MOUTH 3 (THREE) TIMES DAILY. [place], Harlene HERO, FNP Taking Active   Continuous Blood Gluc Transmit (DEXCOM G6 TRANSMITTER) MISC [id number] No Use one every 3 month [provider] Taking Active   dapagliflozin  propanediol (FARXIGA ) 10 MG TABS tablet [id number] No TAKE 1 TABLET BY MOUTH DAILY BEFORE BREAKFAST. [place], Harlene HERO, FNP Taking Active   ferrous sulfate  325 (65 FE) MG tablet [id number] No Take 325 mg by mouth 2 (two) times daily. [provider] Taking Active Self  fluticasone  (FLONASE ) 50 MCG/ACT nasal spray [id number] No Place 1 spray into both nostrils daily as needed for allergies or rhinitis. [provider] Taking Active Self  hydrALAZINE  (APRESOLINE ) 100 MG tablet [id number] No TAKE 1 TABLET BY MOUTH 3 TIMES DAILY. Raford Riggs, MD Taking Active   insulin  glargine (LANTUS ) 100 unit/mL SOPN [id number] No Inject 10 Units into the skin at bedtime. [provider] Taking Active Self           Med Note MANNING, [name]   Tue Oct 13, 2022  9:23 AM)    isosorbide  mononitrate (IMDUR ) 120 MG 24 hr tablet [id number] No Take 120 mg by mouth daily. [provider] Taking Active   LINZESS  145 MCG CAPS capsule [id number]  TAKE 1 CAPSULE BY MOUTH DAILY BEFORE BREAKFAST. Zehr, Jessica  D, PA-C  Active   loratadine  (CLARITIN ) 10 MG tablet [id number] No Take 10 mg by mouth in the morning. [provider] Taking Active Self  Multiple Vitamin (MULTIVITAMIN ADULT) TABS [id number] No Take 1 tablet by mouth in the morning. [provider] Taking Active Self  nitroGLYCERIN  (NITROSTAT ) 0.4 MG SL tablet [id number] No Place 1 tablet (0.4 mg total) under the tongue every 5 (five) minutes as needed for chest pain. [name], Elidia Sieving, MD Taking Active Self           Med Note RANELLE, KEENE HERO   Tue Aug 19, 2021  9:35 AM)    NOVOLOG  FLEXPEN 100 UNIT/ML FlexPen [id number] No Inject 5 Units into the skin 3 (three) times daily with meals. [provider] Taking Active Self           Med Note MANNING, [name]   Tue Oct 13, 2022  9:23 AM)    Omega-3 Fatty Acids (FISH OIL) 1200 MG CAPS [id number] No Take 1,200 mg by mouth in the morning. [provider] Taking Active Self  pregabalin  (LYRICA ) 25 MG capsule [id number] No Take 25 mg by mouth 2 (two) times daily. [provider] Taking Active Self  Semaglutide , 2 MG/DOSE, (OZEMPIC , 2 MG/DOSE,) 8 MG/3ML SOPN [id number] No Inject  2 mg into the skin once a week. Pavero, Lonni, [facility] Taking Active   sertraline  (ZOLOFT ) 50 MG tablet [id number] No Take 50 mg by mouth in the morning. [provider] Taking Active Self  sodium phosphate  (FLEET) 7-19 GM/118ML enema 1 enema [id number]   Legrand Victory LITTIE DOUGLAS, MD  Active   spironolactone  (ALDACTONE ) 50 MG tablet [id number] No Take 50 mg by mouth daily. [provider] Taking Active   torsemide  (DEMADEX ) 20 MG tablet [id number] No Take 1 tablet (20 mg total) by mouth daily as needed.  Patient taking differently: Take 20 mg by mouth daily as needed. Patient takes 1 tablet daily   [place], Harlene HERO, [place] Taking Active   TURMERIC CURCUMIN PO [id number] No Take 500 mg by mouth daily at 6 (six) AM. [provider] Taking Active             Patient Active Problem List   Diagnosis Date Noted   History of colonic polyps 12/28/2022   Chronic constipation 03/31/2022   Pain due to onychomycosis of toenails of both feet 12/05/2021   Pure hypercholesterolemia 05/27/2021   Chronic diastolic CHF (congestive heart failure) (HCC) 04/29/2021   Hypertensive urgency 04/14/2021   Acute on chronic diastolic CHF (congestive heart failure) (HCC) 04/14/2021   Chest pain 03/05/2021   CAD (coronary artery disease) 03/05/2021   T2DM (type 2 diabetes mellitus) (HCC) 03/05/2021   CKD (chronic kidney disease) stage 4, GFR 15-29 ml/min (HCC) 03/05/2021   Class 3 obesity 03/05/2021   CVA (cerebral vascular accident) (HCC) 03/05/2021   Acute on chronic diastolic heart failure (HCC) 03/05/2021   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   AKI (acute kidney injury) (HCC) 03/05/2021   Vitreous hemorrhage of left eye (HCC) 11/14/2020   Orthopnea 08/06/2020   Vision loss of right eye 08/06/2020   Excessive daytime sleepiness 01/11/2020   Iron deficiency anemia 12/14/2019   Vitreous hemorrhage, right (HCC) 12/04/2019   Diabetic neuropathy (HCC) 07/26/2019   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) 02/03/2019   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   Conductive hearing loss, bilateral 03/03/2018   Chronic bilateral low back pain without sciatica 08/21/2015   Chronic kidney disease (CKD) stage G3b/A3, moderately decreased glomerular filtration rate (GFR) between 30-44 mL/min/1.73 square meter and albuminuria creatinine ratio greater than 300 mg/g (HCC) 04/30/2015   Type 2 diabetes mellitus with diabetic polyneuropathy, with long-term current use of insulin  (HCC) 02/15/2015   Advance directive on file 05/17/2014   Refusal of blood transfusions as patient is Jehovah's Witness 05/17/2014   OSA treated with BiPAP 02/23/2014   Coronary artery disease 07/07/2013   Severe obesity (BMI >=  40) (HCC) 05/18/2013   Neuropathy 04/11/2013   Diastolic heart failure (HCC) 10/16/2012   Essential hypertension 10/16/2012   Conditions to be addressed/monitored per PCP order:  Chronic healthcare management needs, HTN, DM, chronic pain, retinopathy, depression, CHF, gout, OSA, neuropathy, h/o CVA, CKD, CAD  Care Plan : RN Care Manager Plan Of Care  Updates made by Milon Veva MATSU, RN since 06/17/2023 12:00 AM     Problem: Knowledge Deficit and Care Coordination Needs Related to Management of HTN, HF, Type 2 DM   Priority: High     Long-Range Goal: Development of Plan of Care to Address Knowledge Deficits and Care Coordination Needs related to  management of CHF, CAD, HTN, HLD, DMII, CKD Stage 4, and Depression   Start Date: 05/06/2021  Expected End Date:  09/15/2023  Priority: High  Note:   Current Barriers:  Knowledge Deficits related to plan of care for management of CHF, CAD, HTN, HLD, DMII, CKD Stage 4, and Depression 06/17/23:  Pain left side, right shoulder continues, although improved.  Saw PCP 12/4 and prescribed muscle relaxer per patient-only made sleepy with little pain relief.  To see ORTHO 1/6.  Blood sugars stable 94-151, does not recall A1C reading from 12/16, but states she was told reading was good.    RNCM Clinical Goal(s):  Patient will demonstrate ongoing adherence to prescribed treatment plan for CHF, CAD, HTN, HLD, DMII, CKD Stage 4, and Depression as evidenced by patient report of improved health and quality of life and no unplanned ED or unplanned hospital admissions continue to work with RN Care Manager and/or Social Worker to address care management and care coordination needs related to CHF, CAD, HTN, HLD, DMII, CKD Stage 4, and Depression as evidenced by adherence to CM Team Scheduled appointments      Work with [facility] LCSW and psychiatrist to treat depression  Interventions: Inter-disciplinary care team collaboration (see longitudinal plan of care) Evaluation of  current treatment plan related to  self management and patient's adherence to plan as established by provider Mailed summary of benefits for her [facility] Managed Medicaid plan to her home address Collaborated with Care Guide for dental resources-completed. Care Guide referral for dental resources Collaborated with BSW for assistance with Paige Cozart appt.-completed. BSW referral for appt assistance. Collaborated with Pharmacy regarding  uncontrolled HTN Pharmacy referral for uncontrolled HTN-completed. Collaborated with LCSW for depression LCSW referral for depression-completed BSW referral for dental and PCP resources-completed. Collaborated with BSW  CAD Interventions: (Status:  New goal.) Long Term Goal Assessed understanding of CAD diagnosis Medications reviewed including medications utilized in CAD treatment plan Provided education on importance of blood pressure control in management of CAD Counseled on importance of regular laboratory monitoring as prescribed Reviewed Importance of taking all medications as prescribed Reviewed Importance of attending all scheduled provider appointments Advised to report any changes in symptoms or exercise tolerance Assessed social determinant of health barriers  Hyperlipidemia Interventions:  (Status:  New goal.) Long Term Goal Medication review performed; medication list updated in electronic medical record.  Provider established cholesterol goals reviewed Counseled on importance of regular laboratory monitoring as prescribed Reviewed importance of limiting foods high in cholesterol Assessed social determinant of health barriers     Heart Failure Interventions:  (Status: Goal on Track (progressing): YES.)  Long Term Goal - patient states her fluid retention has significantly improved,  says she cannot tolerate the compression stockings because they are too tight and make her legs hurt Assessed patient's heart failure management  strategies  Diabetes:  (Status: Goal on Track (progressing): YES.) Long Term Goal   Lab Results  Component Value Date   HGBA1C 6.7 (H) 04/29/2021   Hgn A1C=6.3 on 12/26/21 Hgb A1C=5.2 in October per patient HGBA1C=6.2 on 02/22/23  Assessed patient's understanding of A1C goal: <7% Reviewed medications with patient and discussed importance of medication adherence;        Counseled on importance of regular laboratory monitoring as prescribed;        Discussed plans with patient for ongoing care management follow up and provided patient with direct contact information for care management team;      Advised patient, providing education and rationale, to check cbg three times daily and record        call provider for findings outside established parameters;  Review of patient status, including review of consultants reports, relevant laboratory and other test results, and medications completed;       Discussed addition of Ozempic  to medication regimen , reviewed mechanism of action and proven cardiovascular benefits To schedule ENDO appt-discussed nutrition referral, [facility]-ENDO appt scheduled-patient declines referral.  Hypertension and CKD stage 4 : (Status: Goal on Track (progressing): YES.) Long Term Goal-  has been placed on kidney transplant waiting list, fistu;a placed 12/09/21,ready for use 03/11/22.  BP Readings from Last 3 Encounters:        02/02/23 164/70  04/06/23          144/75 06/17/23         130/70    Most recent eGFR/CrCl:    No components found for: CRCL Lab Results  Component Value Date   NA 136 08/19/2021   K 3.9 08/19/2021   CREATININE 2.51 (H) 08/19/2021   EGFR 19 (L) 03/13/2021   GFRNONAA 22 (L) 08/19/2021   GLUCOSE 122 (H) 08/19/2021    Evaluation of current treatment plan related to hypertension self management and patient's adherence to plan as established by provider;   Reviewed medications with patient and discussed  importance of compliance;  Discussed plans with patient for ongoing care management follow up and provided patient with direct contact information for care management team; Advised patient, providing education and rationale, to monitor blood pressure daily and record, calling PCP for findings outside established parameters;  Reviewed scheduled/upcoming provider appointments   Weight Loss Interventions:  (Status:  New goal.) Long Term Goal--patient has started Ozempic  Discussed indications, mechanism of action, common side effects and CV benefits  of GLP1 Ozempic  Assessed tolerance of Ozempic   Interdisciplinary Collaboration:  (Status: Goal on Track (progressing): YES.) Long Term Goal  Collaborated with BSW to initiate plan of care to address needs related to Housing barriers, Inability to perform ADL's independently, and Inability to perform IADL's independently in patient with CHF, CAD, HTN, HLD, DM, CKD Stage 4, and Depression Assessed status of patient's involvement with behavioral health counselor Collaboration with pharmacist for medication review, discussion of ? Medication side effects causing chronic constipation and arranging for patient to received home delivery of medications in blister packs  Patient Goals/Self-Care Activities: Take medications as prescribed   Attend all scheduled provider appointments Call pharmacy for medication refills 3-7 days in advance of running out of medications Attend church or other social activities Perform all self care activities independently  Call provider office for new concerns or questions  Work with the social worker to address care coordination needs and will continue to work with the clinical team to address health care and disease management related needs call office if I gain more than 2 pounds in one day or 5 pounds in one week keep legs up while sitting track weight in diary use salt in moderation watch for swelling in feet, ankles and  legs every day weigh myself daily know when to call the doctorfor fluid weight gain or consistently abnormal BP readings at home track symptoms and what helps feel better or worse check blood sugar at prescribed times: three times daily enter blood sugar readings and medication or insulin  into daily log take the blood sugar log to all doctor visits take the blood sugar meter to all doctor visits eat fish at least once per week fill half of plate with vegetables manage portion size check blood pressure daily write blood pressure results in a log or diary  learn about high blood pressure take blood pressure log to all doctor appointments call doctor for signs and symptoms of high blood pressure keep all doctor appointments take medications for blood pressure exactly as prescribed Limit fluid intake to 1 liter per day Collaborate with managed Medicaid team of BSW and pharmacist to address identified needs Work with cardiology pharmacist to assist with BP stabilization Work with [facility] LCSW and psychiatrist to treat depression Review summary of [employer] benefits mailed to home address and call to enroll in program that will benefit your chronic health issues Continue to use [facility] Care Management calendar to record weight , BP and CBGs so readings are all in one place Schedule dental appt Schedule ENDO appt-completed. Patient to follow up on ENT referral-completed To schedule missed appts d/t pain   Long-Range Goal: Establish Plan of Care for Chronic Disease Management Needs   Priority: High  Note:   Timeframe:  Long-Range Goal Priority:  High Start Date:     11/12/21                        Expected End Date:  ongoing                     Follow Up Date: 07/19/23   - schedule appointment for vaccines needed due to my age or health - schedule recommended health tests (blood work, mammogram, colonoscopy, pap test) - schedule and keep appointment for annual check-up     Why is this important?   Screening tests can find diseases early when they are easier to treat.  Your doctor or nurse will talk with you about which tests are important for you.  Getting shots for common diseases like the flu and shingles will help prevent them.  06/17/23:  Saw PCP 12/9 and ENDO 12/16.  To see ORTHO 1/6   Follow Up:  Patient agrees to Care Plan and Follow-up.  Plan: The Managed Medicaid care management team will reach out to the patient again over the next 30 business  days. and The  Patient has been provided with contact information for the Managed Medicaid care management team and has been advised to call with any health related questions or concerns.  Date/time of next scheduled RN care management/care coordination outreach: 07/19/23 at 230.

## 2023-06-17 NOTE — Patient Instructions (Signed)
 Hi Ms. Wolanski, I am glad you are feeling better today, have a nice afternoon.  Ms. Sturgess was given information about Medicaid Managed Care team care coordination services as a part of their [facility] Medicaid benefit. Syesha Thaw verbally consented to engagement with the [facility] [address] [facility] [address] Managed Care team.   If you are experiencing a medical emergency, please call 911 or report to your local emergency department or urgent care.   If you have a non-emergency medical problem during routine business hours, please contact your provider's office and ask to speak with a nurse.   For questions related to your [facility] health plan, please call: [phone] or go here:[email or web address]  If you would like to schedule transportation through your [facility] plan, please call the following number at least 2 days in advance of your appointment: [phone].   You can also use the MTM portal or MTM mobile app to manage your rides. Reimbursement for transportation is available through [facility] For the portal, please go to [email or web address]  Call the [facility] Crisis Line at [phone], at any time, 24 hours a day, 7 days a week. If you are in danger or need immediate medical attention call 911.  If you would like help to quit smoking, call 1-800-QUIT-NOW ((626)167-3178) OR Espaol: 1-855-Djelo-Ya (8-144-664-6430) o para ms informacin haga clic aqu or Text READY to 799-599 to register via text  Ms. Perko - following are the goals we discussed in your visit today:   Goals Addressed    Timeframe:  Long-Range Goal Priority:  High Start Date:     11/12/21                        Expected End Date:  ongoing                     Follow Up Date: 07/19/23   - schedule appointment for vaccines needed due to my age or health - schedule recommended health tests (blood work, mammogram, colonoscopy, pap test) - schedule and keep appointment for annual check-up    Why is this important?    Screening tests can find diseases early when they are easier to treat.  Your doctor or nurse will talk with you about which tests are important for you.  Getting shots for common diseases like the flu and shingles will help prevent them.  06/17/23:  Saw PCP 12/9 and ENDO 12/16.  To see ORTHO 1/6  Patient verbalizes understanding of instructions and care plan provided today and agrees to view in MyChart. Active MyChart status and patient understanding of how to access instructions and care plan via MyChart confirmed with patient.     The Managed Medicaid care management team will reach out to the patient again over the next 30 business  days.  The  Patient has been provided with contact information for the Managed Medicaid care management team and has been advised to call with any health related questions or concerns.   Veva Angles RN, BSN   [facility] Care Management Coordinator - Managed Medicaid High Risk [phone]   Following is a copy of your plan of care:  Care Plan : RN Care Manager Plan Of Care  Updates made by Angles Veva MATSU, RN since 06/17/2023 12:00 AM     Problem: Knowledge Deficit and Care Coordination Needs Related to Management of HTN, HF, Type 2 DM   Priority: High     Long-Range Goal: Development of Plan  of Care to Address Knowledge Deficits and Care Coordination Needs related to  management of CHF, CAD, HTN, HLD, DMII, CKD Stage 4, and Depression   Start Date: 05/06/2021  Expected End Date: 09/15/2023  Priority: High  Note:   Current Barriers:  Knowledge Deficits related to plan of care for management of CHF, CAD, HTN, HLD, DMII, CKD Stage 4, and Depression 06/17/23:  Pain left side, right shoulder continues, although improved.  Saw PCP 12/4 and prescribed muscle relaxer per patient-only made sleepy with little pain relief.  To see ORTHO 1/6.  Blood sugars stable 94-151, does not recall A1C reading from 12/16, but states she was told reading was  good.    RNCM Clinical Goal(s):  Patient will demonstrate ongoing adherence to prescribed treatment plan for CHF, CAD, HTN, HLD, DMII, CKD Stage 4, and Depression as evidenced by patient report of improved health and quality of life and no unplanned ED or unplanned hospital admissions continue to work with RN Care Manager and/or Social Worker to address care management and care coordination needs related to CHF, CAD, HTN, HLD, DMII, CKD Stage 4, and Depression as evidenced by adherence to CM Team Scheduled appointments      Work with [facility] LCSW and psychiatrist to treat depression  Interventions: Inter-disciplinary care team collaboration (see longitudinal plan of care) Evaluation of current treatment plan related to  self management and patient's adherence to plan as established by provider Mailed summary of benefits for her [facility] Managed Medicaid plan to her home address Collaborated with Care Guide for dental resources-completed. Care Guide referral for dental resources Collaborated with BSW for assistance with Paige Cozart appt.-completed. BSW referral for appt assistance. Collaborated with Pharmacy regarding  uncontrolled HTN Pharmacy referral for uncontrolled HTN-completed. Collaborated with LCSW for depression LCSW referral for depression-completed BSW referral for dental and PCP resources-completed. Collaborated with BSW  CAD Interventions: (Status:  New goal.) Long Term Goal Assessed understanding of CAD diagnosis Medications reviewed including medications utilized in CAD treatment plan Provided education on importance of blood pressure control in management of CAD Counseled on importance of regular laboratory monitoring as prescribed Reviewed Importance of taking all medications as prescribed Reviewed Importance of attending all scheduled provider appointments Advised to report any changes in symptoms or exercise tolerance Assessed social determinant of health  barriers  Hyperlipidemia Interventions:  (Status:  New goal.) Long Term Goal Medication review performed; medication list updated in electronic medical record.  Provider established cholesterol goals reviewed Counseled on importance of regular laboratory monitoring as prescribed Reviewed importance of limiting foods high in cholesterol Assessed social determinant of health barriers     Heart Failure Interventions:  (Status: Goal on Track (progressing): YES.)  Long Term Goal - patient states her fluid retention has significantly improved,  says she cannot tolerate the compression stockings because they are too tight and make her legs hurt Assessed patient's heart failure management strategies  Diabetes:  (Status: Goal on Track (progressing): YES.) Long Term Goal   Lab Results  Component Value Date   HGBA1C 6.7 (H) 04/29/2021   Hgn A1C=6.3 on 12/26/21 Hgb A1C=5.2 in October per patient HGBA1C=6.2 on 02/22/23  Assessed patient's understanding of A1C goal: <7% Reviewed medications with patient and discussed importance of medication adherence;        Counseled on importance of regular laboratory monitoring as prescribed;        Discussed plans with patient for ongoing care management follow up and provided patient with direct contact information for care management  team;      Advised patient, providing education and rationale, to check cbg three times daily and record        call provider for findings outside established parameters;       Review of patient status, including review of consultants reports, relevant laboratory and other test results, and medications completed;       Discussed addition of Ozempic  to medication regimen , reviewed mechanism of action and proven cardiovascular benefits To schedule ENDO appt-discussed nutrition referral, [facility]-ENDO appt scheduled-patient declines referral.  Hypertension and CKD stage 4 : (Status: Goal on Track  (progressing): YES.) Long Term Goal-  has been placed on kidney transplant waiting list, fistu;a placed 12/09/21,ready for use 03/11/22.  BP Readings from Last 3 Encounters:        02/02/23 164/70  04/06/23          144/75 06/17/23         130/70    Most recent eGFR/CrCl:    No components found for: CRCL Lab Results  Component Value Date   NA 136 08/19/2021   K 3.9 08/19/2021   CREATININE 2.51 (H) 08/19/2021   EGFR 19 (L) 03/13/2021   GFRNONAA 22 (L) 08/19/2021   GLUCOSE 122 (H) 08/19/2021    Evaluation of current treatment plan related to hypertension self management and patient's adherence to plan as established by provider;   Reviewed medications with patient and discussed importance of compliance;  Discussed plans with patient for ongoing care management follow up and provided patient with direct contact information for care management team; Advised patient, providing education and rationale, to monitor blood pressure daily and record, calling PCP for findings outside established parameters;  Reviewed scheduled/upcoming provider appointments   Weight Loss Interventions:  (Status:  New goal.) Long Term Goal--patient has started Ozempic  Discussed indications, mechanism of action, common side effects and CV benefits  of GLP1 Ozempic  Assessed tolerance of Ozempic   Interdisciplinary Collaboration:  (Status: Goal on Track (progressing): YES.) Long Term Goal  Collaborated with BSW to initiate plan of care to address needs related to Housing barriers, Inability to perform ADL's independently, and Inability to perform IADL's independently in patient with CHF, CAD, HTN, HLD, DM, CKD Stage 4, and Depression Assessed status of patient's involvement with behavioral health counselor Collaboration with pharmacist for medication review, discussion of ? Medication side effects causing chronic constipation and arranging for patient to received home delivery of medications in blister packs  Patient  Goals/Self-Care Activities: Take medications as prescribed   Attend all scheduled provider appointments Call pharmacy for medication refills 3-7 days in advance of running out of medications Attend church or other social activities Perform all self care activities independently  Call provider office for new concerns or questions  Work with the social worker to address care coordination needs and will continue to work with the clinical team to address health care and disease management related needs call office if I gain more than 2 pounds in one day or 5 pounds in one week keep legs up while sitting track weight in diary use salt in moderation watch for swelling in feet, ankles and legs every day weigh myself daily know when to call the doctorfor fluid weight gain or consistently abnormal BP readings at home track symptoms and what helps feel better or worse check blood sugar at prescribed times: three times daily enter blood sugar readings and medication or insulin  into daily log take the blood sugar log to all  doctor visits take the blood sugar meter to all doctor visits eat fish at least once per week fill half of plate with vegetables manage portion size check blood pressure daily write blood pressure results in a log or diary learn about high blood pressure take blood pressure log to all doctor appointments call doctor for signs and symptoms of high blood pressure keep all doctor appointments take medications for blood pressure exactly as prescribed Limit fluid intake to 1 liter per day Collaborate with managed Medicaid team of BSW and pharmacist to address identified needs Work with cardiology pharmacist to assist with BP stabilization Work with [facility] LCSW and psychiatrist to treat depression Review summary of [employer] benefits mailed to home address and call to enroll in program that will benefit your chronic health issues Continue to use [facility] Care  Management calendar to record weight , BP and CBGs so readings are all in one place Schedule dental appt Schedule ENDO appt-completed. Patient to follow up on ENT referral-completed To schedule missed appts d/t pain.

## 2023-06-21 ENCOUNTER — Ambulatory Visit: Payer: Medicaid Other | Admitting: Orthopaedic Surgery

## 2023-06-21 ENCOUNTER — Other Ambulatory Visit: Payer: Self-pay

## 2023-06-21 DIAGNOSIS — M542 Cervicalgia: Secondary | ICD-10-CM

## 2023-06-21 DIAGNOSIS — M25512 Pain in left shoulder: Secondary | ICD-10-CM | POA: Diagnosis not present

## 2023-06-21 DIAGNOSIS — M2550 Pain in unspecified joint: Secondary | ICD-10-CM

## 2023-06-21 DIAGNOSIS — G8929 Other chronic pain: Secondary | ICD-10-CM

## 2023-06-21 NOTE — Progress Notes (Signed)
 The patient is a 63 year old that we have seen before.  She comes today with multiple aches and pains in terms of hurting all over.  She has also been having headaches.  I have encouraged her to reach out to her primary care physician.  We were originally seeing her for neck pain and left shoulder pain.  She has now been into physical therapy.  She says now both shoulders hurt as well as both hips and both knees.  She reports neck pain and back pain.  She is someone who is morbidly obese.  She is a diabetic but reports good control.  She does have congestive heart failure in the past and cannot take anti-inflammatories.  She is on Lyrica .  Both hips and both shoulders as well as both knees all move smoothly and fluidly no blocks to rotation.  There is no effusion of either knee.  She has a lot of pain with any types of motions of any of her joints but again there is no blocks to motion and there is no gross abnormalities.  She certainly has some type of pain syndrome or even a rheumatologic issue going on given her multiple aches and joint complaints and pain.  At this point I would like to send her to a referral to rheumatology to see if this is some type of chronic inflammatory process.  We will make that referral.  She agrees as well.

## 2023-06-24 DIAGNOSIS — H43391 Other vitreous opacities, right eye: Secondary | ICD-10-CM | POA: Diagnosis not present

## 2023-06-24 DIAGNOSIS — H4311 Vitreous hemorrhage, right eye: Secondary | ICD-10-CM | POA: Diagnosis not present

## 2023-06-24 DIAGNOSIS — E113513 Type 2 diabetes mellitus with proliferative diabetic retinopathy with macular edema, bilateral: Secondary | ICD-10-CM | POA: Diagnosis not present

## 2023-06-24 DIAGNOSIS — H31093 Other chorioretinal scars, bilateral: Secondary | ICD-10-CM | POA: Diagnosis not present

## 2023-06-24 DIAGNOSIS — H43811 Vitreous degeneration, right eye: Secondary | ICD-10-CM | POA: Diagnosis not present

## 2023-06-24 DIAGNOSIS — H20042 Secondary noninfectious iridocyclitis, left eye: Secondary | ICD-10-CM | POA: Diagnosis not present

## 2023-06-24 DIAGNOSIS — H35033 Hypertensive retinopathy, bilateral: Secondary | ICD-10-CM | POA: Diagnosis not present

## 2023-06-24 DIAGNOSIS — H35373 Puckering of macula, bilateral: Secondary | ICD-10-CM | POA: Diagnosis not present

## 2023-06-30 DIAGNOSIS — I509 Heart failure, unspecified: Secondary | ICD-10-CM | POA: Diagnosis not present

## 2023-06-30 DIAGNOSIS — E1122 Type 2 diabetes mellitus with diabetic chronic kidney disease: Secondary | ICD-10-CM | POA: Diagnosis not present

## 2023-06-30 DIAGNOSIS — I10 Essential (primary) hypertension: Secondary | ICD-10-CM | POA: Diagnosis not present

## 2023-06-30 DIAGNOSIS — M609 Myositis, unspecified: Secondary | ICD-10-CM | POA: Diagnosis not present

## 2023-07-07 DIAGNOSIS — N185 Chronic kidney disease, stage 5: Secondary | ICD-10-CM | POA: Diagnosis not present

## 2023-07-07 DIAGNOSIS — E1122 Type 2 diabetes mellitus with diabetic chronic kidney disease: Secondary | ICD-10-CM | POA: Diagnosis not present

## 2023-07-07 DIAGNOSIS — I120 Hypertensive chronic kidney disease with stage 5 chronic kidney disease or end stage renal disease: Secondary | ICD-10-CM | POA: Diagnosis not present

## 2023-07-07 DIAGNOSIS — I5032 Chronic diastolic (congestive) heart failure: Secondary | ICD-10-CM | POA: Diagnosis not present

## 2023-07-07 DIAGNOSIS — N2581 Secondary hyperparathyroidism of renal origin: Secondary | ICD-10-CM | POA: Diagnosis not present

## 2023-07-07 LAB — LAB REPORT - SCANNED
Creatinine, POC: 47.7 mg/dL
EGFR: 13

## 2023-07-17 DIAGNOSIS — Z419 Encounter for procedure for purposes other than remedying health state, unspecified: Secondary | ICD-10-CM | POA: Diagnosis not present

## 2023-07-19 ENCOUNTER — Other Ambulatory Visit: Payer: Self-pay | Admitting: Obstetrics and Gynecology

## 2023-07-19 NOTE — Patient Outreach (Signed)
Medicaid Managed Care   Nurse Care Manager Note  07/19/2023 Name:  Maria Hudson MRN:  [id number] DOB:  09/14/1960  Maria Hudson is an 63 y.o. year old female who is a primary patient of Maria Contras, MD.  The Medicaid Managed Care Coordination team was consulted for assistance with:    Chronic healthcare management needs, HTN, DM, CKD, chronic pain, retinopathy, depression, CHF, gout, OSA, neuropathy, h/o CVA, CAD  [name] was given information about Medicaid Managed Care Coordination team services today. Maria Hudson Patient agreed to services and verbal consent obtained.  [name] for follow up visit in response to provider referral for case management and/or care coordination services.   Patient is participating in a Managed Medicaid Plan:  Yes  Assessments/Interventions:  Review of past medical history, allergies, medications, health status, including review of consultants reports, laboratory and other test data, was performed as part of comprehensive evaluation and provision of chronic care management services.  SDOH (Social Drivers of Health) assessments and interventions performed: SDOH Interventions    Flowsheet Row Patient Outreach Telephone from 07/19/2023 in [place] POPULATION [facility] DEPARTMENT Patient Outreach Telephone from 06/17/2023 in [place] POPULATION HEALTH DEPARTMENT Patient Outreach Telephone from 05/19/2023 in [place] POPULATION HEALTH DEPARTMENT Patient Outreach Telephone from 04/06/2023 in [place] POPULATION HEALTH DEPARTMENT Patient Outreach Telephone from 02/08/2023 in [place] POPULATION HEALTH DEPARTMENT Patient Outreach Telephone from 01/08/2023 in  POPULATION HEALTH DEPARTMENT  SDOH Interventions        Housing Interventions -- Intervention Not Indicated -- -- -- --  Transportation Interventions -- -- -- -- -- Intervention Not Indicated  Utilities Interventions Intervention Not Indicated -- -- -- Intervention  Not Indicated --  Alcohol Usage Interventions -- Intervention Not Indicated (Score <7) -- -- -- --  Financial Strain Interventions -- -- -- -- Intervention Not Indicated --  Physical Activity Interventions -- -- Other (Comments)  [not able to participate in this type of exercise] -- -- --  Stress Interventions -- -- Provide Counseling, Other (Comment)  [[facility] [employer]] -- -- --  Social Connections Interventions -- -- -- Intervention Not Indicated -- --  Health Literacy Interventions Intervention Not Indicated -- -- -- -- Intervention Not Indicated     Care Plan Allergies  Allergen Reactions   Metformin And Related Nausea Only   Other     Clonidine patch causes skin irritation    Hydrocodone Itching    Patient able to tolerate when taken with Benadryl.   Oxycodone-Acetaminophen Itching    Patient able to tolerate when taken with Benadryl.   Medications Reviewed Today     Reviewed by [name], RN (Registered Nurse) on 07/19/23 at 1445  Med List Status: <None>   Medication Order Taking? Sig Documenting Provider Last Dose Status Informant  Accu-Chek Softclix Lancets lancets [id number] No 3 (three) times daily. [provider] Taking Active Self  acetaminophen (TYLENOL) 500 MG tablet [id number] No Take 1,000 mg by mouth every 6 (six) hours as needed for moderate pain or headache. [provider] Taking Active Self  allopurinol (ZYLOPRIM) 100 MG tablet [id number] No Take 100 mg by mouth in the morning. [provider] Taking Active Self  amLODipine (NORVASC) 10 MG tablet [id number] No Take 10 mg by mouth daily. [provider] Taking Active   aspirin EC 81 MG tablet [id number] No Take 81 mg by mouth in the morning. Swallow whole. [provider] Taking Active Self  Med Note [name]   Mon Apr 14, 2021  9:22 PM)    atorvastatin (LIPITOR) 40 MG tablet [id number] No Take 1 tablet (40 mg total) by mouth every evening. Chilton Si, MD Taking Active Self  buPROPion [facility]) 100 MG tablet [id number] No Take 100 mg by mouth 2 (two) times daily. [provider] Taking Active Self  carvedilol (COREG) 25 MG tablet [id number] No Take 1 tablet (25 mg total) by mouth 2 (two) times daily with a meal. [name], York Ram, MD Taking Active Self  cholecalciferol (VITAMIN D3) 25 MCG (1000 UNIT) tablet [id number] No Take 2,000 Units by mouth daily. [provider] Taking Active Self  cloNIDine (CATAPRES) 0.2 MG tablet [id number] No TAKE 2 TABLETS (0.4 MG TOTAL) BY MOUTH 3 (THREE) TIMES DAILY. [place], Anderson Malta, FNP Taking Active   Continuous Blood Gluc Transmit (DEXCOM G6 TRANSMITTER) MISC [id number] No Use one every 3 month [provider] Taking Active   dapagliflozin propanediol (FARXIGA) 10 MG TABS tablet [id number] No TAKE 1 TABLET BY MOUTH DAILY BEFORE BREAKFAST. [place], Anderson Malta, FNP Taking Active   ferrous sulfate 325 (65 FE) MG tablet [id number] No Take 325 mg by mouth 2 (two) times daily. [provider] Taking Active Self  fluticasone (FLONASE) 50 MCG/ACT nasal spray [id number] No Place 1 spray into both nostrils daily as needed for allergies or rhinitis. [provider] Taking Active Self  hydrALAZINE (APRESOLINE) 100 MG tablet [id number] No TAKE 1 TABLET BY MOUTH 3 TIMES DAILY. Chilton Si, MD Taking Active   insulin glargine (LANTUS) 100 unit/mL SOPN [id number] No Inject 10 Units into the skin at bedtime. [provider] Taking Active Self           Med Note [name], [name]   Tue Oct 13, 2022  9:23 AM)    isosorbide mononitrate (IMDUR) 120 MG 24 hr tablet [id number] No Take 120 mg by mouth daily. [provider] Taking Active   LINZESS 145 MCG CAPS capsule [id number]  TAKE 1 CAPSULE BY MOUTH DAILY BEFORE BREAKFAST. Zehr, Princella Pellegrini, PA-C  Active   loratadine (CLARITIN) 10 MG tablet [id number] No Take 10 mg by mouth in the morning. [provider] Taking Active Self  Multiple Vitamin (MULTIVITAMIN ADULT) TABS [id number] No Take 1 tablet by mouth in the morning. [provider] Taking Active Self  nitroGLYCERIN (NITROSTAT) 0.4 MG SL tablet [id number] No Place 1 tablet (0.4 mg total) under the tongue every 5 (five) minutes as needed for chest pain. [name], York Ram, MD Taking Active Self           Med Note Kittie Plater, Smitty Cords   Tue Aug 19, 2021  9:35 AM)    Rubbie Battiest FLEXPEN 100 UNIT/ML FlexPen [id number] No Inject 5 Units into the skin 3 (three) times daily with meals. [provider] Taking Active Self           Med Note [name], [name]   Tue Oct 13, 2022  9:23 AM)    Omega-3 Fatty Acids (FISH OIL) 1200 MG CAPS [id number] No Take 1,200 mg by mouth in the morning. [provider] Taking Active Self  pregabalin (LYRICA) 25 MG capsule [id number] No Take 75 mg by mouth 2 (two) times daily. [provider] Taking Active Self  Semaglutide, 2 MG/DOSE, (OZEMPIC, 2 MG/DOSE,) 8 MG/3ML SOPN [id number] No Inject 2 mg into the skin once a week. Cheree Ditto, [facility] Taking Active   sertraline (ZOLOFT) 50 MG tablet [id number] No  Take 50 mg by mouth in the morning. [provider] Taking Active Self  sodium phosphate (Maria) 7-19 GM/118ML enema 1 enema [id number]   Sherrilyn Rist, MD  Active   spironolactone (ALDACTONE) 50 MG tablet [id number] No Take 50 mg by mouth daily. [provider] Taking Active   torsemide (DEMADEX) 20 MG tablet [id number] No Take 1 tablet (20 mg total) by mouth daily as needed.  Patient taking differently: Take 20 mg by mouth daily as needed. Patient takes 1 tablet daily   [place], Anderson Malta, [place] Taking Active   TURMERIC CURCUMIN PO [id number] No Take 500 mg by mouth daily at 6 (six) AM. [provider] Taking Active            Patient Active Problem List   Diagnosis Date Noted   History of colonic polyps 12/28/2022   Chronic  constipation 03/31/2022   Pain due to onychomycosis of toenails of both feet 12/05/2021   Pure hypercholesterolemia 05/27/2021   Chronic diastolic CHF (congestive heart failure) (HCC) 04/29/2021   Hypertensive urgency 04/14/2021   Acute on chronic diastolic CHF (congestive heart failure) (HCC) 04/14/2021   Chest pain 03/05/2021   CAD (coronary artery disease) 03/05/2021   T2DM (type 2 diabetes mellitus) (HCC) 03/05/2021   CKD (chronic kidney disease) stage 4, GFR 15-29 ml/min (HCC) 03/05/2021   Class 3 obesity 03/05/2021   CVA (cerebral vascular accident) (HCC) 03/05/2021   Acute on chronic diastolic heart failure (HCC) 03/05/2021   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   AKI (acute kidney injury) (HCC) 03/05/2021   Vitreous hemorrhage of left eye (HCC) 11/14/2020   Orthopnea 08/06/2020   Vision loss of right eye 08/06/2020   Excessive daytime sleepiness 01/11/2020   Iron deficiency anemia 12/14/2019   Vitreous hemorrhage, right (HCC) 12/04/2019   Diabetic neuropathy (HCC) 07/26/2019   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) 02/03/2019   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   Conductive hearing loss, bilateral 03/03/2018   Chronic bilateral low back pain without sciatica 08/21/2015   Chronic kidney disease (CKD) stage G3b/A3, moderately decreased glomerular filtration rate (GFR) between 30-44 mL/min/1.73 square meter and albuminuria creatinine ratio greater than 300 mg/g (HCC) 04/30/2015   Type 2 diabetes mellitus with diabetic polyneuropathy, with long-term current use of insulin (HCC) 02/15/2015   Advance directive on file 05/17/2014   Refusal of blood transfusions as patient is Jehovah's Witness 05/17/2014   OSA treated with BiPAP 02/23/2014   Coronary artery disease 07/07/2013   Severe obesity (BMI >= 40) (HCC) 05/18/2013   Neuropathy 04/11/2013   Diastolic heart failure (HCC) 10/16/2012   Essential  hypertension 10/16/2012   ns to be addressed/monitored per PCP order:  Chronic healthcare management needs, HTN, DM, CKD, chronic pain, retinopathy, depression, CHF, gout, OSA, neuropathy, h/o CVA, CAD  Care Plan : RN Care Manager Plan Of Care  Updates made by [name], RN since 07/19/2023 12:00 AM     Problem: Knowledge Deficit and Care Coordination Needs Related to Management of HTN, HF, Type 2 DM   Priority: High     Long-Range Goal: Development of Plan of Care to Address Knowledge Deficits and Care Coordination Needs related to  management of CHF, CAD, HTN, HLD, DMII, CKD Stage 4, and Depression   Start Date: 05/06/2021  Expected End Date: 09/15/2023  Priority: High  Note:   Current Barriers:  Knowledge Deficits related to plan of care for management of CHF,  CAD, HTN, HLD, DMII, CKD Stage 4, and Depression 07/19/23:  Patient c/o blurry/double vision for 1 week, to schedule an appt with eye provider today.  To start home peritoneal dialysis in the near future.  BP and blood sugar stable right now.  Blood sugar 96-164.  States neuropathy is improved with meds.  To f/u on RHEUM referral placed by [facility] for ? Chronic process as reason for pain.    RNCM Clinical Goal(s):  Patient will demonstrate ongoing adherence to prescribed treatment plan for CHF, CAD, HTN, HLD, DMII, CKD Stage 4, and Depression as evidenced by patient report of improved health and quality of life and no unplanned ED or unplanned hospital admissions continue to work with RN Care Manager and/or Social Worker to address care management and care coordination needs related to CHF, CAD, HTN, HLD, DMII, CKD Stage 4, and Depression as evidenced by adherence to CM Team Scheduled appointments      Work with [facility] LCSW and psychiatrist to treat depression  Interventions: Inter-disciplinary care team collaboration (see longitudinal plan of care) Evaluation of current treatment plan related to  self management and patient's  adherence to plan as established by provider Mailed summary of benefits for her [facility] Managed Medicaid plan to her home address Collaborated with Care Guide for dental resources-completed. Care Guide referral for dental resources Collaborated with BSW for assistance with [facility] appt.-completed. BSW referral for appt assistance. Collaborated with Pharmacy regarding  uncontrolled HTN Pharmacy referral for uncontrolled HTN-completed. Collaborated with LCSW for depression LCSW referral for depression-completed BSW referral for dental and PCP resources-completed. Collaborated with BSW  CAD Interventions: (Status:  New goal.) Long Term Goal Assessed understanding of CAD diagnosis Medications reviewed including medications utilized in CAD treatment plan Provided education on importance of blood pressure control in management of CAD Counseled on importance of regular laboratory monitoring as prescribed Reviewed Importance of taking all medications as prescribed Reviewed Importance of attending all scheduled provider appointments Advised to report any changes in symptoms or exercise tolerance Assessed social determinant of health barriers  Hyperlipidemia Interventions:  (Status:  New goal.) Long Term Goal Medication review performed; medication list updated in electronic medical record.  Provider established cholesterol goals reviewed Counseled on importance of regular laboratory monitoring as prescribed Reviewed importance of limiting foods high in cholesterol Assessed social determinant of health barriers     Heart Failure Interventions:  (Status: Goal on Track (progressing): YES.)  Long Term Goal - patient states her fluid retention has significantly improved,  says she cannot tolerate the compression stockings because they are too tight and make her legs hurt Assessed patient's heart failure management strategies  Diabetes:  (Status: Goal on Track (progressing): YES.) Long Term  Goal   Lab Results  Component Value Date   HGBA1C 6.7 (H) 04/29/2021   Hgn A1C=6.5 on 05/31/23 Hgb A1C=5.2 in October per patient HGBA1C=6.2 on 02/22/23  Assessed patient's understanding of A1C goal: <7% Reviewed medications with patient and discussed importance of medication adherence;        Counseled on importance of regular laboratory monitoring as prescribed;        Discussed plans with patient for ongoing care management follow up and provided patient with direct contact information for care management team;      Advised patient, providing education and rationale, to check cbg three times daily and record        call provider for findings outside established parameters;       Review of patient status,  including review of consultants reports, relevant laboratory and other test results, and medications completed;       Discussed addition of Ozempic to medication regimen , reviewed mechanism of action and proven cardiovascular benefits To schedule ENDO appt-discussed nutrition referral, [facility]-ENDO appt scheduled-patient declines referral.  Hypertension and CKD stage 4 : (Status: Goal on Track (progressing): YES.) Long Term Goal-  has been placed on kidney transplant waiting list, fistu;a placed 12/09/21,ready for use 03/11/22.  BP Readings from Last 3 Encounters:        07/19/23 139/76  04/06/23          144/75 06/17/23         130/70    Most recent eGFR/CrCl:    No components found for: CRCL Lab Results  Component Value Date   NA 136 08/19/2021   K 3.9 08/19/2021   CREATININE 2.51 (H) 08/19/2021   EGFR 19 (L) 03/13/2021   GFRNONAA 22 (L) 08/19/2021   GLUCOSE 122 (H) 08/19/2021    Evaluation of current treatment plan related to hypertension self management and patient's adherence to plan as established by provider;   Reviewed medications with patient and discussed importance of compliance;  Discussed plans with patient for ongoing care  management follow up and provided patient with direct contact information for care management team; Advised patient, providing education and rationale, to monitor blood pressure daily and record, calling PCP for findings outside established parameters;  Reviewed scheduled/upcoming provider appointments   Weight Loss Interventions:  (Status:  New goal.) Long Term Goal--patient has started Ozempic Discussed indications, mechanism of action, common side effects and CV benefits  of GLP1 Ozempic Assessed tolerance of Ozempic  Interdisciplinary Collaboration:  (Status: Goal on Track (progressing): YES.) Long Term Goal  Collaborated with BSW to initiate plan of care to address needs related to Housing barriers, Inability to perform ADL's independently, and Inability to perform IADL's independently in patient with CHF, CAD, HTN, HLD, DM, CKD Stage 4, and Depression Assessed status of patient's involvement with behavioral health counselor Collaboration with pharmacist for medication review, discussion of ? Medication side effects causing chronic constipation and arranging for patient to received home delivery of medications in blister packs  Patient Goals/Self-Care Activities: Take medications as prescribed   Attend all scheduled provider appointments Call pharmacy for medication refills 3-7 days in advance of running out of medications Attend church or other social activities Perform all self care activities independently  Call provider office for new concerns or questions  Work with the social worker to address care coordination needs and will continue to work with the clinical team to address health care and disease management related needs call office if I gain more than 2 pounds in one day or 5 pounds in one week keep legs up while sitting track weight in diary use salt in moderation watch for swelling in feet, ankles and legs every day weigh myself daily know when to call the doctorfor fluid  weight gain or consistently abnormal BP readings at home track symptoms and what helps feel better or worse check blood sugar at prescribed times: three times daily enter blood sugar readings and medication or insulin into daily log take the blood sugar log to all doctor visits take the blood sugar meter to all doctor visits eat fish at least once per week fill half of plate with vegetables manage portion size check blood pressure daily write blood pressure results in a log or diary learn about high blood  pressure take blood pressure log to all doctor appointments call doctor for signs and symptoms of high blood pressure keep all doctor appointments take medications for blood pressure exactly as prescribed Limit fluid intake to 1 liter per day Collaborate with managed Medicaid team of BSW and pharmacist to address identified needs Work with cardiology pharmacist to assist with BP stabilization Work with [facility] LCSW and psychiatrist to treat depression Review summary of [employer] benefits mailed to home address and call to enroll in program that will benefit your chronic health issues Continue to use [facility] Care Management calendar to record weight , BP and CBGs so readings are all in one place Schedule dental appt Schedule ENDO appt-completed. Patient to follow up on ENT referral-completed To schedule missed appts d/t pain. 07/19/23: To f/u on RHEUM referral and schedule eye appt.   Long-Range Goal: Establish Plan of Care for Chronic Disease Management Needs   Priority: High  Note:   Timeframe:  Long-Range Goal Priority:  High Start Date:     11/12/21                        Expected End Date:  ongoing                     Follow Up Date: 08/17/23   - schedule appointment for vaccines needed due to my age or health - schedule recommended health tests (blood work, mammogram, colonoscopy, pap test) - schedule and keep appointment for annual check-up    Why is this  important?   Screening tests can find diseases early when they are easier to treat.  Your doctor or nurse will talk with you about which tests are important for you.  Getting shots for common diseases like the flu and shingles will help prevent them.  07/19/23:  patient to f/u on RHEUM referral.  To schedule eye appt.   Follow Up:  Patient agrees to Care Plan and Follow-up.  Plan: The Managed Medicaid care management team will reach out to the patient again over the next 30 business  days. and The  Patient has been provided with contact information for the Managed Medicaid care management team and has been advised to call with any health related questions or concerns.  Date/time of next scheduled RN care management/care coordination outreach: 08/17/23 at 1230.

## 2023-07-19 NOTE — Patient Instructions (Signed)
Visit Information  Ms. Elizarraraz was given information about Medicaid Managed Care team care coordination services as a part of their [facility] Medicaid benefit. Mackayla Mullins verbally consented to engagement with the [facility] Managed Care team.   If you are experiencing a medical emergency, please call 911 or report to your local emergency department or urgent care.   If you have a non-emergency medical problem during routine business hours, please contact your provider's office and ask to speak with a nurse.   For questions related to your [facility] health plan, please call: [phone] or go here:[email or web address]  If you would like to schedule transportation through your [facility] plan, please call the following number at least 2 days in advance of your appointment: [phone].   You can also use the MTM portal or MTM mobile app to manage your rides. Reimbursement for transportation is available through [facility]! For the portal, please go to [email or web address]  Call the [facility] Crisis Line at [phone], at any time, 24 hours a day, 7 days a week. If you are in danger or need immediate medical attention call 911.  If you would like help to quit smoking, call 1-800-QUIT-NOW ((817)876-3098) OR Espaol: 1-855-Djelo-Ya (4-235-361-4431) o para ms informacin haga clic aqu or Text READY to 540-086 to register via text  Ms. Gouger - following are the goals we discussed in your visit today:   Goals Addressed    Timeframe:  Long-Range Goal Priority:  High Start Date:     11/12/21                        Expected End Date:  ongoing                     Follow Up Date: 08/17/23   - schedule appointment for vaccines needed due to my age or health - schedule recommended health tests (blood work, mammogram, colonoscopy, pap test) - schedule and keep appointment for annual check-up    Why is this important?   Screening tests can find diseases early when they are  easier to treat.  Your doctor or nurse will talk with you about which tests are important for you.  Getting shots for common diseases like the flu and shingles will help prevent them.  07/19/23:  patient to f/u on RHEUM referral.  To schedule eye appt.  Patient verbalizes understanding of instructions and care plan provided today and agrees to view in MyChart. Active MyChart status and patient understanding of how to access instructions and care plan via MyChart confirmed with patient.     The Managed Medicaid care management team will reach out to the patient again over the next 30 business  days.  The  Patient  has been provided with contact information for the Managed Medicaid care management team and has been advised to call with any health related questions or concerns.   Kathi Der RN, BSN, [employer] [facility] Health RN Care Manager Direct Dial [phone]/ZTI [phone] Website: [email or web address]   Following is a copy of your plan of care:  Care Plan : RN Care Manager Plan Of Care  Updates made by [name], RN since 07/19/2023 12:00 AM     Problem: Knowledge Deficit and Care Coordination Needs Related to Management of HTN, HF, Type 2 DM   Priority: High     Long-Range Goal: Development of Plan of Care to Address Knowledge Deficits and Care Coordination Needs related to  management of CHF, CAD, HTN, HLD, DMII, CKD Stage 4, and Depression   Start Date: 05/06/2021  Expected End Date: 09/15/2023  Priority: High  Note:   Current Barriers:  Knowledge Deficits related to plan of care for management of CHF, CAD, HTN, HLD, DMII, CKD Stage 4, and Depression 07/19/23:  Patient c/o blurry/double vision for 1 week, to schedule an appt with eye provider today.  To start home peritoneal dialysis in the near future.  BP and blood sugar stable right now.  Blood sugar 96-164.  States neuropathy is improved with meds.  To f/u on RHEUM referral placed by [facility] for ? Chronic  process as reason for pain.    RNCM Clinical Goal(s):  Patient will demonstrate ongoing adherence to prescribed treatment plan for CHF, CAD, HTN, HLD, DMII, CKD Stage 4, and Depression as evidenced by patient report of improved health and quality of life and no unplanned ED or unplanned hospital admissions continue to work with RN Care Manager and/or Social Worker to address care management and care coordination needs related to CHF, CAD, HTN, HLD, DMII, CKD Stage 4, and Depression as evidenced by adherence to CM Team Scheduled appointments      Work with [facility] LCSW and psychiatrist to treat depression  Interventions: Inter-disciplinary care team collaboration (see longitudinal plan of care) Evaluation of current treatment plan related to  self management and patient's adherence to plan as established by provider Mailed summary of benefits for her [facility] Managed Medicaid plan to her home address Collaborated with Care Guide for dental resources-completed. Care Guide referral for dental resources Collaborated with BSW for assistance with [facility] [place] appt.-completed. BSW referral for appt assistance. Collaborated with Pharmacy regarding  uncontrolled HTN Pharmacy referral for uncontrolled HTN-completed. Collaborated with LCSW for depression LCSW referral for depression-completed BSW referral for dental and PCP resources-completed. Collaborated with BSW  CAD Interventions: (Status:  New goal.) Long Term Goal Assessed understanding of CAD diagnosis Medications reviewed including medications utilized in CAD treatment plan Provided education on importance of blood pressure control in management of CAD Counseled on importance of regular laboratory monitoring as prescribed Reviewed Importance of taking all medications as prescribed Reviewed Importance of attending all scheduled provider appointments Advised to report any changes in symptoms or exercise tolerance Assessed social determinant  of health barriers  Hyperlipidemia Interventions:  (Status:  New goal.) Long Term Goal Medication review performed; medication list updated in electronic medical record.  Provider established cholesterol goals reviewed Counseled on importance of regular laboratory monitoring as prescribed Reviewed importance of limiting foods high in cholesterol Assessed social determinant of health barriers     Heart Failure Interventions:  (Status: Goal on Track (progressing): YES.)  Long Term Goal - patient states her fluid retention has significantly improved,  says she cannot tolerate the compression stockings because they are too tight and make her legs hurt Assessed patient's heart failure management strategies  Diabetes:  (Status: Goal on Track (progressing): YES.) Long Term Goal   Lab Results  Component Value Date   HGBA1C 6.7 (H) 04/29/2021   Hgn A1C=6.5 on 05/31/23 Hgb A1C=5.2 in October per patient HGBA1C=6.2 on 02/22/23  Assessed patient's understanding of A1C goal: <7% Reviewed medications with patient and discussed importance of medication adherence;        Counseled on importance of regular laboratory monitoring as prescribed;        Discussed plans with patient for ongoing care management follow up and provided patient with direct contact information for care management team;  Advised patient, providing education and rationale, to check cbg three times daily and record        call provider for findings outside established parameters;       Review of patient status, including review of consultants reports, relevant laboratory and other test results, and medications completed;       Discussed addition of Ozempic to medication regimen , reviewed mechanism of action and proven cardiovascular benefits To schedule ENDO appt-discussed nutrition referral, [facility]-ENDO appt scheduled-patient declines referral.  Hypertension and CKD stage 4 : (Status: Goal on  Track (progressing): YES.) Long Term Goal-  has been placed on kidney transplant waiting list, fistu;a placed 12/09/21,ready for use 03/11/22.  BP Readings from Last 3 Encounters:        07/19/23 139/76  04/06/23          144/75 06/17/23         130/70    Most recent eGFR/CrCl:    No components found for: CRCL Lab Results  Component Value Date   NA 136 08/19/2021   K 3.9 08/19/2021   CREATININE 2.51 (H) 08/19/2021   EGFR 19 (L) 03/13/2021   GFRNONAA 22 (L) 08/19/2021   GLUCOSE 122 (H) 08/19/2021    Evaluation of current treatment plan related to hypertension self management and patient's adherence to plan as established by provider;   Reviewed medications with patient and discussed importance of compliance;  Discussed plans with patient for ongoing care management follow up and provided patient with direct contact information for care management team; Advised patient, providing education and rationale, to monitor blood pressure daily and record, calling PCP for findings outside established parameters;  Reviewed scheduled/upcoming provider appointments   Weight Loss Interventions:  (Status:  New goal.) Long Term Goal--patient has started Ozempic Discussed indications, mechanism of action, common side effects and CV benefits  of GLP1 Ozempic Assessed tolerance of Ozempic  Interdisciplinary Collaboration:  (Status: Goal on Track (progressing): YES.) Long Term Goal  Collaborated with BSW to initiate plan of care to address needs related to Housing barriers, Inability to perform ADL's independently, and Inability to perform IADL's independently in patient with CHF, CAD, HTN, HLD, DM, CKD Stage 4, and Depression Assessed status of patient's involvement with behavioral health counselor Collaboration with pharmacist for medication review, discussion of ? Medication side effects causing chronic constipation and arranging for patient to received home delivery of medications in blister  packs  Patient Goals/Self-Care Activities: Take medications as prescribed   Attend all scheduled provider appointments Call pharmacy for medication refills 3-7 days in advance of running out of medications Attend church or other social activities Perform all self care activities independently  Call provider office for new concerns or questions  Work with the social worker to address care coordination needs and will continue to work with the clinical team to address health care and disease management related needs call office if I gain more than 2 pounds in one day or 5 pounds in one week keep legs up while sitting track weight in diary use salt in moderation watch for swelling in feet, ankles and legs every day weigh myself daily know when to call the doctorfor fluid weight gain or consistently abnormal BP readings at home track symptoms and what helps feel better or worse check blood sugar at prescribed times: three times daily enter blood sugar readings and medication or insulin into daily log take the blood sugar log to all doctor visits take the blood sugar  meter to all doctor visits eat fish at least once per week fill half of plate with vegetables manage portion size check blood pressure daily write blood pressure results in a log or diary learn about high blood pressure take blood pressure log to all doctor appointments call doctor for signs and symptoms of high blood pressure keep all doctor appointments take medications for blood pressure exactly as prescribed Limit fluid intake to 1 liter per day Collaborate with managed Medicaid team of BSW and pharmacist to address identified needs Work with cardiology pharmacist to assist with BP stabilization Work with [facility] LCSW and psychiatrist to treat depression Review summary of [employer] benefits mailed to home address and call to enroll in program that will benefit your chronic health issues Continue to use [facility] Care Management calendar to record weight , BP and CBGs so readings are all in one place Schedule dental appt Schedule ENDO appt-completed. Patient to follow up on ENT referral-completed To schedule missed appts d/t pain. 07/19/23: To f/u on RHEUM referral and schedule eye appt.

## 2023-07-22 DIAGNOSIS — H43811 Vitreous degeneration, right eye: Secondary | ICD-10-CM | POA: Diagnosis not present

## 2023-07-22 DIAGNOSIS — H20042 Secondary noninfectious iridocyclitis, left eye: Secondary | ICD-10-CM | POA: Diagnosis not present

## 2023-07-30 ENCOUNTER — Encounter (HOSPITAL_COMMUNITY): Payer: Self-pay

## 2023-07-30 ENCOUNTER — Ambulatory Visit (HOSPITAL_COMMUNITY)
Admission: EM | Admit: 2023-07-30 | Discharge: 2023-07-30 | Disposition: A | Discharge status: Home / Self Care | Payer: Medicaid Other

## 2023-07-30 DIAGNOSIS — J988 Other specified respiratory disorders: Secondary | ICD-10-CM

## 2023-07-30 DIAGNOSIS — R051 Acute cough: Secondary | ICD-10-CM | POA: Diagnosis not present

## 2023-07-30 DIAGNOSIS — B9789 Other viral agents as the cause of diseases classified elsewhere: Secondary | ICD-10-CM | POA: Diagnosis not present

## 2023-07-30 NOTE — Discharge Instructions (Addendum)
I believe your symptoms are related to a viral upper respiratory illness.  You can continue taking Coricidin as needed for cough and congestion.  Take Tylenol as needed for any pain and fever.  Stay hydrated and get plenty of rest.  Return here if symptoms persist or worsen.

## 2023-07-30 NOTE — ED Triage Notes (Addendum)
Pt c/o of a cough that she has had.   Start Date: 07/26/2023  Interventions: Coricidin/Cold and flu chest

## 2023-07-30 NOTE — ED Provider Notes (Signed)
[facility]    CSN: [id number] Arrival date & time: 07/30/23  1027      History   Chief Complaint Chief Complaint  Patient presents with   Cough    HPI Maria Hudson is a 63 y.o. female.   Patient presents with dry cough and congestion that began on Tuesday.  Denies shortness of breath, chest pain, fever, and fatigue.  Reports she has been taking Coricidin with relief.   Cough   Past Medical History:  Diagnosis Date   Acute on chronic diastolic heart failure (HCC) 03/05/2021   Anemia    Arthritis    CAD (coronary artery disease)    CHF (congestive heart failure) (HCC)    CKD (chronic kidney disease) stage 4, GFR 15-29 ml/min (HCC) 03/05/2021   Colon polyps    CVA (cerebral vascular accident) (HCC)    Depression    Diabetes mellitus without complication (HCC)    type 2   Dyspnea    Hypertension    Hypertensive urgency 04/14/2021   Memory loss    mild   OSA (obstructive sleep apnea) 05/27/2021   uses CPAP 12-18 cm   Pneumonia    several times   Pure hypercholesterolemia 05/27/2021   Renal disorder    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   Stroke [facility])     Patient Active Problem List   Diagnosis Date Noted   History of colonic polyps 12/28/2022   Chronic constipation 03/31/2022   Pain due to onychomycosis of toenails of both feet 12/05/2021   Pure hypercholesterolemia 05/27/2021   Chronic diastolic CHF (congestive heart failure) (HCC) 04/29/2021   Hypertensive urgency 04/14/2021   Acute on chronic diastolic CHF (congestive heart failure) (HCC) 04/14/2021   Chest pain 03/05/2021   CAD (coronary artery disease) 03/05/2021   T2DM (type 2 diabetes mellitus) (HCC) 03/05/2021   CKD (chronic kidney disease) stage 4, GFR 15-29 ml/min (HCC) 03/05/2021   Class 3 obesity 03/05/2021   CVA (cerebral vascular accident) (HCC) 03/05/2021   Acute on chronic diastolic heart failure (HCC) 03/05/2021   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   AKI (acute kidney  injury) (HCC) 03/05/2021   Vitreous hemorrhage of left eye (HCC) 11/14/2020   Orthopnea 08/06/2020   Vision loss of right eye 08/06/2020   Excessive daytime sleepiness 01/11/2020   Iron deficiency anemia 12/14/2019   Vitreous hemorrhage, right (HCC) 12/04/2019   Diabetic neuropathy (HCC) 07/26/2019   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) 02/03/2019   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   Conductive hearing loss, bilateral 03/03/2018   Chronic bilateral low back pain without sciatica 08/21/2015   Chronic kidney disease (CKD) stage G3b/A3, moderately decreased glomerular filtration rate (GFR) between 30-44 mL/min/1.73 square meter and albuminuria creatinine ratio greater than 300 mg/g (HCC) 04/30/2015   Type 2 diabetes mellitus with diabetic polyneuropathy, with long-term current use of insulin (HCC) 02/15/2015   Advance directive on file 05/17/2014   Refusal of blood transfusions as patient is Jehovah's Witness 05/17/2014   OSA treated with BiPAP 02/23/2014   Coronary artery disease 07/07/2013   Severe obesity (BMI >= 40) (HCC) 05/18/2013   Neuropathy 04/11/2013   Diastolic heart failure (HCC) 10/16/2012   Essential hypertension 10/16/2012    Past Surgical History:  Procedure Laterality Date   ABDOMINAL HYSTERECTOMY     AV FISTULA PLACEMENT Left 12/09/2021   Procedure: LEFT ARTERIOVENOUS (AV) FISTULA CREATION;  Surgeon: Maeola Harman, MD;  Location: [facility] OR;  Service:  Vascular;  Laterality: Left;   BIOPSY  09/08/2021   Procedure: BIOPSY;  Surgeon: Sherrilyn Rist, MD;  Location: [facility];  Service: Gastroenterology;;   CESAREAN SECTION     x 1   ESOPHAGOGASTRODUODENOSCOPY (EGD) WITH PROPOFOL N/A 09/08/2021   Procedure: ESOPHAGOGASTRODUODENOSCOPY (EGD) WITH PROPOFOL;  Surgeon: Sherrilyn Rist, MD;  Location: [facility];  Service: Gastroenterology;  Laterality: N/A;  chest pain, epigastric  pain   INNER EAR SURGERY Bilateral    RIGHT HEART CATH N/A 05/14/2021   Procedure: RIGHT HEART CATH;  Surgeon: Laurey Morale, MD;  Location: [facility] INVASIVE CV LAB;  Service: Cardiovascular;  Laterality: N/A;   TONSILLECTOMY     age 8    OB History     Gravida  3   Para  2   Term  1   Preterm  1   AB  1   Living  2      SAB  1   IAB      Ectopic      Multiple      Live Births  2            Home Medications    Prior to Admission medications   Medication Sig Start Date End Date Taking? Authorizing Provider  Accu-Chek Softclix Lancets lancets 3 (three) times daily. 06/26/21   [provider]  acetaminophen (TYLENOL) 500 MG tablet Take 1,000 mg by mouth every 6 (six) hours as needed for moderate pain or headache.    [provider]  allopurinol (ZYLOPRIM) 100 MG tablet Take 100 mg by mouth in the morning. 01/27/21   [provider]  amLODipine (NORVASC) 10 MG tablet Take 10 mg by mouth daily. 02/25/22   [provider]  aspirin EC 81 MG tablet Take 81 mg by mouth in the morning. Swallow whole.    [provider]  atorvastatin (LIPITOR) 40 MG tablet Take 1 tablet (40 mg total) by mouth every evening. 05/27/21   Chilton Si, MD  buPROPion (WELLBUTRIN) 100 MG tablet Take 100 mg by mouth 2 (two) times daily. 02/04/21   [provider]  carvedilol (COREG) 25 MG tablet Take 1 tablet (25 mg total) by mouth 2 (two) times daily with a meal. 03/07/21   [name], York Ram, MD  cholecalciferol (VITAMIN D3) 25 MCG (1000 UNIT) tablet Take 2,000 Units by mouth daily.    [provider]  cloNIDine (CATAPRES) 0.2 MG tablet TAKE 2 TABLETS (0.4 MG TOTAL) BY MOUTH 3 (THREE) TIMES DAILY. 03/29/23   Milford, Anderson Malta, FNP  Continuous Blood Gluc Transmit (DEXCOM G6 TRANSMITTER) MISC Use one every 3 month 05/25/22   [provider]  dapagliflozin propanediol (FARXIGA) 10 MG TABS tablet TAKE 1 TABLET BY MOUTH  DAILY BEFORE BREAKFAST. 04/20/23   Jacklynn Ganong, FNP  ferrous sulfate 325 (65 FE) MG tablet Take 325 mg by mouth 2 (two) times daily. 05/31/21   [provider]  fluticasone (FLONASE) 50 MCG/ACT nasal spray Place 1 spray into both nostrils daily as needed for allergies or rhinitis.    [provider]  hydrALAZINE (APRESOLINE) 100 MG tablet TAKE 1 TABLET BY MOUTH 3 TIMES DAILY. 05/22/22   Chilton Si, MD  insulin glargine (LANTUS) 100 unit/mL SOPN Inject 10 Units into the skin at bedtime.    [provider]  isosorbide mononitrate (IMDUR) 120 MG 24 hr tablet Take 120 mg by mouth daily.    [provider]  Karlene Einstein 145  MCG CAPS capsule TAKE 1 CAPSULE BY MOUTH DAILY BEFORE BREAKFAST. 05/12/23   Zehr, Princella Pellegrini, PA-C  loratadine (CLARITIN) 10 MG tablet Take 10 mg by mouth in the morning. 02/14/21   [provider]  Multiple Vitamin (MULTIVITAMIN ADULT) TABS Take 1 tablet by mouth in the morning.    [provider]  nitroGLYCERIN (NITROSTAT) 0.4 MG SL tablet Place 1 tablet (0.4 mg total) under the tongue every 5 (five) minutes as needed for chest pain. 03/07/21   [name], York Ram, MD  NOVOLOG FLEXPEN 100 UNIT/ML FlexPen Inject 5 Units into the skin 3 (three) times daily with meals. 01/12/21   [provider]  Omega-3 Fatty Acids (FISH OIL) 1200 MG CAPS Take 1,200 mg by mouth in the morning.    [provider]  pregabalin (LYRICA) 25 MG capsule Take 75 mg by mouth 2 (two) times daily. 02/03/21   [provider]  Semaglutide, 2 MG/DOSE, (OZEMPIC, 2 MG/DOSE,) 8 MG/3ML SOPN Inject 2 mg into the skin once a week. 11/12/22   Pavero, Cristal Deer, RPH  sertraline (ZOLOFT) 50 MG tablet Take 50 mg by mouth in the morning. 02/01/21   [provider]  spironolactone (ALDACTONE) 50 MG tablet Take 50 mg by mouth daily. 03/11/22   [provider]  torsemide (DEMADEX) 20 MG tablet Take 1 tablet (20 mg total) by  mouth daily as needed. Patient taking differently: Take 20 mg by mouth daily as needed. Patient takes 1 tablet daily 04/20/22   Milford, Anderson Malta, FNP  TURMERIC CURCUMIN PO Take 500 mg by mouth daily at 6 (six) AM.    [provider]    Family History Family History  Problem Relation Age of Onset   Hypertension Mother    Stroke Mother    Diabetes Mother    Other Mother        enlarged heart   Hypertension Father    Hypertension Sister    Diabetes Sister    CAD Sister        enlarged heart   Kidney disease Sister    Diabetes Paternal Grandmother    Hypertension Daughter    Diabetes Daughter    Hypertension Son    Diabetes Son    Breast cancer Paternal Aunt     Social History Social History   Tobacco Use   Smoking status: Never    Passive exposure: Never   Smokeless tobacco: Never  Vaping Use   Vaping status: Never Used  Substance Use Topics   Alcohol use: Never   Drug use: Never     Allergies   Metformin and related, Other, Hydrocodone, and Oxycodone-acetaminophen   Review of Systems Review of Systems  Respiratory:  Positive for cough.    Per HPI  Physical Exam Triage Vital Signs ED Triage Vitals  Encounter Vitals Group     BP 07/30/23 1100 (!) 152/65     Systolic BP Percentile --      Diastolic BP Percentile --      Pulse Rate 07/30/23 1100 71     Resp 07/30/23 1100 18     Temp 07/30/23 1100 (!) 97.4 F (36.3 C)     Temp Source 07/30/23 1100 Oral     SpO2 07/30/23 1100 95 %     Weight --      Height --      Head Circumference --      Peak Flow --      Pain Score 07/30/23 1058 0  Pain Loc --      Pain Education --      Exclude from Growth Chart --    No data found.  Updated Vital Signs BP (!) 152/65 (BP Location: Left Arm)   Pulse 71   Temp (!) 97.4 F (36.3 C) (Oral)   Resp 18   SpO2 95%   Visual Acuity Right Eye Distance:   Left Eye Distance:   Bilateral Distance:    Right Eye Near:   Left Eye Near:    Bilateral  Near:     Physical Exam Vitals and nursing note reviewed.  Constitutional:      General: She is awake. She is not in acute distress.    Appearance: Normal appearance. She is well-developed and well-groomed. She is not ill-appearing.  HENT:     Right Ear: Tympanic membrane, ear canal and external ear normal.     Left Ear: Tympanic membrane, ear canal and external ear normal.     Nose: Congestion and rhinorrhea present.     Mouth/Throat:     Mouth: Mucous membranes are moist.     Pharynx: Posterior oropharyngeal erythema present. No oropharyngeal exudate.  Cardiovascular:     Rate and Rhythm: Normal rate and regular rhythm.  Pulmonary:     Effort: Pulmonary effort is normal.     Breath sounds: Normal breath sounds.  Musculoskeletal:        General: Normal range of motion.     Cervical back: Normal range of motion and neck supple.  Skin:    General: Skin is warm and dry.  Neurological:     Mental Status: She is alert.  Psychiatric:        Behavior: Behavior is cooperative.      UC Treatments / Results  Labs (all labs ordered are listed, but only abnormal results are displayed) Labs Reviewed - No data to display  EKG   Radiology No results found.  Procedures Procedures (including critical care time)  Medications Ordered in UC Medications - No data to display  Initial Impression / Assessment and Plan / UC Course  I have reviewed the triage vital signs and the nursing notes.  Pertinent labs & imaging results that were available during my care of the patient were reviewed by me and considered in my medical decision making (see chart for details).     Patient presented with 4-day history of dry cough and congestion.  Denies any other symptoms.  Upon assessment congestion and rhinorrhea are present, mild erythema noted to pharynx.  Lungs clear bilaterally to auscultation.  No other significant findings upon exam.  Recommended over-the-counter medication as needed  for symptoms.  Discussed return precautions. Final Clinical Impressions(s) / UC Diagnoses   Final diagnoses:  Viral respiratory illness  Acute cough     Discharge Instructions      I believe your symptoms are related to a viral upper respiratory illness.  You can continue taking Coricidin as needed for cough and congestion.  Take Tylenol as needed for any pain and fever.  Stay hydrated and get plenty of rest.  Return here if symptoms persist or worsen.    ED Prescriptions   None    PDMP not reviewed this encounter.   Wynonia Lawman A, NP 07/30/23 1128

## 2023-08-02 DIAGNOSIS — N185 Chronic kidney disease, stage 5: Secondary | ICD-10-CM | POA: Diagnosis not present

## 2023-08-02 DIAGNOSIS — I5032 Chronic diastolic (congestive) heart failure: Secondary | ICD-10-CM | POA: Diagnosis not present

## 2023-08-02 DIAGNOSIS — I120 Hypertensive chronic kidney disease with stage 5 chronic kidney disease or end stage renal disease: Secondary | ICD-10-CM | POA: Diagnosis not present

## 2023-08-02 DIAGNOSIS — N2581 Secondary hyperparathyroidism of renal origin: Secondary | ICD-10-CM | POA: Diagnosis not present

## 2023-08-02 DIAGNOSIS — E1122 Type 2 diabetes mellitus with diabetic chronic kidney disease: Secondary | ICD-10-CM | POA: Diagnosis not present

## 2023-08-04 ENCOUNTER — Telehealth: Payer: Self-pay | Admitting: *Deleted

## 2023-08-04 ENCOUNTER — Encounter: Payer: Self-pay | Admitting: Vascular Surgery

## 2023-08-04 ENCOUNTER — Ambulatory Visit: Payer: Medicaid Other | Admitting: Vascular Surgery

## 2023-08-04 VITALS — BP 157/70 | HR 71 | Temp 98.0°F | Resp 20 | Ht 63.0 in | Wt 212.0 lb

## 2023-08-04 DIAGNOSIS — N185 Chronic kidney disease, stage 5: Secondary | ICD-10-CM

## 2023-08-04 NOTE — Progress Notes (Signed)
Patient ID: Maria Hudson, female   DOB: 1961/02/22, 63 y.o.   MRN: [id number]  Reason for Consult: Follow-up   Referred by Estanislado Emms, MD  Subjective:     HPI:  Maria Hudson is a 63 y.o. female history of chronic kidney disease now stage V with associated hypertension, chronic diastolic heart failure and type 2 diabetes.  She has undergone left upper arm AV fistula creation in the past which has never been accessed and does follow with transplant surgery as well.  She is now here to discuss PD catheter placement.  She has previous transabdominal hysterectomy as well as tubal ligation.  She denies other intra-abdominal surgeries.  Past Medical History:  Diagnosis Date   Acute on chronic diastolic heart failure (HCC) 03/05/2021   Anemia    Arthritis    CAD (coronary artery disease)    CHF (congestive heart failure) (HCC)    CKD (chronic kidney disease) stage 4, GFR 15-29 ml/min (HCC) 03/05/2021   Colon polyps    CVA (cerebral vascular accident) (HCC)    Depression    Diabetes mellitus without complication (HCC)    type 2   Dyspnea    Hypertension    Hypertensive urgency 04/14/2021   Memory loss    mild   OSA (obstructive sleep apnea) 05/27/2021   uses CPAP 12-18 cm   Pneumonia    several times   Pure hypercholesterolemia 05/27/2021   Renal disorder    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   Stroke [facility])    Family History  Problem Relation Age of Onset   Hypertension Mother    Stroke Mother    Diabetes Mother    Other Mother        enlarged heart   Hypertension Father    Hypertension Sister    Diabetes Sister    CAD Sister        enlarged heart   Kidney disease Sister    Diabetes Paternal Grandmother    Hypertension Daughter    Diabetes Daughter    Hypertension Son    Diabetes Son    Breast cancer Paternal Aunt    Past Surgical History:  Procedure Laterality Date   ABDOMINAL HYSTERECTOMY     AV FISTULA PLACEMENT Left 12/09/2021   Procedure: LEFT  ARTERIOVENOUS (AV) FISTULA CREATION;  Surgeon: Maeola Harman, MD;  Location: [facility] OR;  Service: Vascular;  Laterality: Left;   BIOPSY  09/08/2021   Procedure: BIOPSY;  Surgeon: Sherrilyn Rist, MD;  Location: [facility];  Service: Gastroenterology;;   CESAREAN SECTION     x 1   ESOPHAGOGASTRODUODENOSCOPY (EGD) WITH PROPOFOL N/A 09/08/2021   Procedure: ESOPHAGOGASTRODUODENOSCOPY (EGD) WITH PROPOFOL;  Surgeon: Sherrilyn Rist, MD;  Location: [facility];  Service: Gastroenterology;  Laterality: N/A;  chest pain, epigastric pain   INNER EAR SURGERY Bilateral    RIGHT HEART CATH N/A 05/14/2021   Procedure: RIGHT HEART CATH;  Surgeon: Laurey Morale, MD;  Location: [facility] INVASIVE CV LAB;  Service: Cardiovascular;  Laterality: N/A;   TONSILLECTOMY     age 22    Short Social History:  Social History   Tobacco Use   Smoking status: Never    Passive exposure: Never   Smokeless tobacco: Never  Substance Use Topics   Alcohol use: Never    Allergies  Allergen Reactions   Metformin And Related Nausea Only   Other     Clonidine patch causes skin irritation    Hydrocodone Itching  Patient able to tolerate when taken with Benadryl.   Oxycodone-Acetaminophen Itching    Patient able to tolerate when taken with Benadryl.    Current Outpatient Medications  Medication Sig Dispense Refill   Accu-Chek Softclix Lancets lancets 3 (three) times daily.     acetaminophen (TYLENOL) 500 MG tablet Take 1,000 mg by mouth every 6 (six) hours as needed for moderate pain or headache.     allopurinol (ZYLOPRIM) 100 MG tablet Take 100 mg by mouth in the morning.     amLODipine (NORVASC) 10 MG tablet Take 10 mg by mouth daily.     aspirin EC 81 MG tablet Take 81 mg by mouth in the morning. Swallow whole.     atorvastatin (LIPITOR) 40 MG tablet Take 1 tablet (40 mg total) by mouth every evening. 90 tablet 3   buPROPion (WELLBUTRIN) 100 MG tablet Take 100 mg by mouth 2 (two) times daily.      carvedilol (COREG) 25 MG tablet Take 1 tablet (25 mg total) by mouth 2 (two) times daily with a meal. 60 tablet 0   cholecalciferol (VITAMIN D3) 25 MCG (1000 UNIT) tablet Take 2,000 Units by mouth daily.     cloNIDine (CATAPRES) 0.2 MG tablet TAKE 2 TABLETS (0.4 MG TOTAL) BY MOUTH 3 (THREE) TIMES DAILY. 504 tablet 3   Continuous Blood Gluc Transmit (DEXCOM G6 TRANSMITTER) MISC Use one every 3 month     dapagliflozin propanediol (FARXIGA) 10 MG TABS tablet TAKE 1 TABLET BY MOUTH DAILY BEFORE BREAKFAST. 30 tablet 11   ferrous sulfate 325 (65 FE) MG tablet Take 325 mg by mouth 2 (two) times daily.     fluticasone (FLONASE) 50 MCG/ACT nasal spray Place 1 spray into both nostrils daily as needed for allergies or rhinitis.     hydrALAZINE (APRESOLINE) 100 MG tablet TAKE 1 TABLET BY MOUTH 3 TIMES DAILY. 270 tablet 2   insulin glargine (LANTUS) 100 unit/mL SOPN Inject 10 Units into the skin at bedtime.     isosorbide mononitrate (IMDUR) 120 MG 24 hr tablet Take 120 mg by mouth daily.     LINZESS 145 MCG CAPS capsule TAKE 1 CAPSULE BY MOUTH DAILY BEFORE BREAKFAST. 30 capsule 11   loratadine (CLARITIN) 10 MG tablet Take 10 mg by mouth in the morning.     Multiple Vitamin (MULTIVITAMIN ADULT) TABS Take 1 tablet by mouth in the morning.     nitroGLYCERIN (NITROSTAT) 0.4 MG SL tablet Place 1 tablet (0.4 mg total) under the tongue every 5 (five) minutes as needed for chest pain. 20 tablet 0   NOVOLOG FLEXPEN 100 UNIT/ML FlexPen Inject 5 Units into the skin 3 (three) times daily with meals.     Omega-3 Fatty Acids (FISH OIL) 1200 MG CAPS Take 1,200 mg by mouth in the morning.     pregabalin (LYRICA) 25 MG capsule Take 75 mg by mouth 2 (two) times daily.     Semaglutide, 2 MG/DOSE, (OZEMPIC, 2 MG/DOSE,) 8 MG/3ML SOPN Inject 2 mg into the skin once a week. 3 mL 6   sertraline (ZOLOFT) 50 MG tablet Take 50 mg by mouth in the morning.     spironolactone (ALDACTONE) 50 MG tablet Take 50 mg by mouth daily.      torsemide (DEMADEX) 20 MG tablet Take 1 tablet (20 mg total) by mouth daily as needed. (Patient taking differently: Take 20 mg by mouth daily as needed. Patient takes 1 tablet daily) 90 tablet 3   TURMERIC CURCUMIN PO Take 500  mg by mouth daily at 6 (six) AM.     Current Facility-Administered Medications  Medication Dose Route Frequency Provider Last Rate Last Admin   sodium phosphate (FLEET) 7-19 GM/118ML enema 1 enema  1 enema Rectal Once Danis, Andreas Blower, MD       Facility-Administered Medications Ordered in Other Visits  Medication Dose Route Frequency Provider Last Rate Last Admin   technetium pyrophosphate Tc 56m injection 21.2 millicurie  21.2 millicurie Intravenous Once Hilty, Lisette Abu, MD        Review of Systems  Constitutional:  Constitutional negative. HENT: HENT negative.  Eyes: Eyes negative.  Respiratory: Respiratory negative.  Cardiovascular: Cardiovascular negative.  GI: Gastrointestinal negative.  Musculoskeletal: Musculoskeletal negative.  Skin: Skin negative.  Neurological: Neurological negative. Hematologic: Hematologic/lymphatic negative.  Psychiatric: Psychiatric negative.        Objective:  Objective   Vitals:   08/04/23 0956  BP: (!) 157/70  Pulse: 71  Resp: 20  Temp: 98 F (36.7 C)  SpO2: 94%  Weight: 212 lb (96.2 kg)  Height: 5\' 3"  (1.6 m)   Body mass index is 37.55 kg/m.  Physical Exam Constitutional:      Appearance: She is obese.  HENT:     Head: Normocephalic.     Mouth/Throat:     Mouth: Mucous membranes are moist.  Eyes:     Pupils: Pupils are equal, round, and reactive to light.  Cardiovascular:     Rate and Rhythm: Normal rate.     Pulses:          Radial pulses are 2+ on the right side and 2+ on the left side.  Pulmonary:     Effort: Pulmonary effort is normal.  Abdominal:     General: Abdomen is flat.     Palpations: Abdomen is soft.     Comments: Well-healed midline abdominal scar  Musculoskeletal:     Comments:  Pulsatility and left upper arm fistula  Skin:    Capillary Refill: Capillary refill takes less than 2 seconds.  Neurological:     Mental Status: She is alert.  Psychiatric:        Mood and Affect: Mood normal.     Data: No studies     Assessment/Plan:     63 year old female with CKD 5 now here for evaluation of peritoneal dialysis catheter placement.  I evaluated her abdomen and we discussed that she would be high risk to have a catheter placed and fully functional.  This is secondary to previous abdominal surgeries and also her abdominal girth.  We discussed that we could place the catheter albeit with increased risk of injury to abdominal structures and primary nonfunction which would require removal of the catheter.  She would also need clearance by Dr. Ronelle Nigh whom she follows closely with.  She demonstrates good understanding of this and she will discuss with Dr. Ronelle Nigh and she will also discuss with her family given the high risk nature of this procedure and she will call to schedule if she decides to pursue laparoscopic continuous ambulatory PD catheter placement.  Otherwise she would likely require fistulogram of her left upper extremity access prior to use when she is requiring intermittent dialysis.     Maeola Harman MD [facility]

## 2023-08-04 NOTE — H&P (View-Only) (Signed)
 Patient ID: Maria Hudson, female   DOB: 1961/02/22, 63 y.o.   MRN: [id number]  Reason for Consult: Follow-up   Referred by Estanislado Emms, MD  Subjective:     HPI:  Maria Hudson is a 63 y.o. female history of chronic kidney disease now stage V with associated hypertension, chronic diastolic heart failure and type 2 diabetes.  She has undergone left upper arm AV fistula creation in the past which has never been accessed and does follow with transplant surgery as well.  She is now here to discuss PD catheter placement.  She has previous transabdominal hysterectomy as well as tubal ligation.  She denies other intra-abdominal surgeries.  Past Medical History:  Diagnosis Date   Acute on chronic diastolic heart failure (HCC) 03/05/2021   Anemia    Arthritis    CAD (coronary artery disease)    CHF (congestive heart failure) (HCC)    CKD (chronic kidney disease) stage 4, GFR 15-29 ml/min (HCC) 03/05/2021   Colon polyps    CVA (cerebral vascular accident) (HCC)    Depression    Diabetes mellitus without complication (HCC)    type 2   Dyspnea    Hypertension    Hypertensive urgency 04/14/2021   Memory loss    mild   OSA (obstructive sleep apnea) 05/27/2021   uses CPAP 12-18 cm   Pneumonia    several times   Pure hypercholesterolemia 05/27/2021   Renal disorder    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   Stroke [facility])    Family History  Problem Relation Age of Onset   Hypertension Mother    Stroke Mother    Diabetes Mother    Other Mother        enlarged heart   Hypertension Father    Hypertension Sister    Diabetes Sister    CAD Sister        enlarged heart   Kidney disease Sister    Diabetes Paternal Grandmother    Hypertension Daughter    Diabetes Daughter    Hypertension Son    Diabetes Son    Breast cancer Paternal Aunt    Past Surgical History:  Procedure Laterality Date   ABDOMINAL HYSTERECTOMY     AV FISTULA PLACEMENT Left 12/09/2021   Procedure: LEFT  ARTERIOVENOUS (AV) FISTULA CREATION;  Surgeon: Maeola Harman, MD;  Location: [facility] OR;  Service: Vascular;  Laterality: Left;   BIOPSY  09/08/2021   Procedure: BIOPSY;  Surgeon: Sherrilyn Rist, MD;  Location: [facility] ENDOSCOPY;  Service: Gastroenterology;;   CESAREAN SECTION     x 1   ESOPHAGOGASTRODUODENOSCOPY (EGD) WITH PROPOFOL N/A 09/08/2021   Procedure: ESOPHAGOGASTRODUODENOSCOPY (EGD) WITH PROPOFOL;  Surgeon: Sherrilyn Rist, MD;  Location: [facility] ENDOSCOPY;  Service: Gastroenterology;  Laterality: N/A;  chest pain, epigastric pain   INNER EAR SURGERY Bilateral    RIGHT HEART CATH N/A 05/14/2021   Procedure: RIGHT HEART CATH;  Surgeon: Laurey Morale, MD;  Location: [facility] INVASIVE CV LAB;  Service: Cardiovascular;  Laterality: N/A;   TONSILLECTOMY     age 22    Short Social History:  Social History   Tobacco Use   Smoking status: Never    Passive exposure: Never   Smokeless tobacco: Never  Substance Use Topics   Alcohol use: Never    Allergies  Allergen Reactions   Metformin And Related Nausea Only   Other     Clonidine patch causes skin irritation    Hydrocodone Itching  Patient able to tolerate when taken with Benadryl.   Oxycodone-Acetaminophen Itching    Patient able to tolerate when taken with Benadryl.    Current Outpatient Medications  Medication Sig Dispense Refill   Accu-Chek Softclix Lancets lancets 3 (three) times daily.     acetaminophen (TYLENOL) 500 MG tablet Take 1,000 mg by mouth every 6 (six) hours as needed for moderate pain or headache.     allopurinol (ZYLOPRIM) 100 MG tablet Take 100 mg by mouth in the morning.     amLODipine (NORVASC) 10 MG tablet Take 10 mg by mouth daily.     aspirin EC 81 MG tablet Take 81 mg by mouth in the morning. Swallow whole.     atorvastatin (LIPITOR) 40 MG tablet Take 1 tablet (40 mg total) by mouth every evening. 90 tablet 3   buPROPion (WELLBUTRIN) 100 MG tablet Take 100 mg by mouth 2 (two) times daily.      carvedilol (COREG) 25 MG tablet Take 1 tablet (25 mg total) by mouth 2 (two) times daily with a meal. 60 tablet 0   cholecalciferol (VITAMIN D3) 25 MCG (1000 UNIT) tablet Take 2,000 Units by mouth daily.     cloNIDine (CATAPRES) 0.2 MG tablet TAKE 2 TABLETS (0.4 MG TOTAL) BY MOUTH 3 (THREE) TIMES DAILY. 504 tablet 3   Continuous Blood Gluc Transmit (DEXCOM G6 TRANSMITTER) MISC Use one every 3 month     dapagliflozin propanediol (FARXIGA) 10 MG TABS tablet TAKE 1 TABLET BY MOUTH DAILY BEFORE BREAKFAST. 30 tablet 11   ferrous sulfate 325 (65 FE) MG tablet Take 325 mg by mouth 2 (two) times daily.     fluticasone (FLONASE) 50 MCG/ACT nasal spray Place 1 spray into both nostrils daily as needed for allergies or rhinitis.     hydrALAZINE (APRESOLINE) 100 MG tablet TAKE 1 TABLET BY MOUTH 3 TIMES DAILY. 270 tablet 2   insulin glargine (LANTUS) 100 unit/mL SOPN Inject 10 Units into the skin at bedtime.     isosorbide mononitrate (IMDUR) 120 MG 24 hr tablet Take 120 mg by mouth daily.     LINZESS 145 MCG CAPS capsule TAKE 1 CAPSULE BY MOUTH DAILY BEFORE BREAKFAST. 30 capsule 11   loratadine (CLARITIN) 10 MG tablet Take 10 mg by mouth in the morning.     Multiple Vitamin (MULTIVITAMIN ADULT) TABS Take 1 tablet by mouth in the morning.     nitroGLYCERIN (NITROSTAT) 0.4 MG SL tablet Place 1 tablet (0.4 mg total) under the tongue every 5 (five) minutes as needed for chest pain. 20 tablet 0   NOVOLOG FLEXPEN 100 UNIT/ML FlexPen Inject 5 Units into the skin 3 (three) times daily with meals.     Omega-3 Fatty Acids (FISH OIL) 1200 MG CAPS Take 1,200 mg by mouth in the morning.     pregabalin (LYRICA) 25 MG capsule Take 75 mg by mouth 2 (two) times daily.     Semaglutide, 2 MG/DOSE, (OZEMPIC, 2 MG/DOSE,) 8 MG/3ML SOPN Inject 2 mg into the skin once a week. 3 mL 6   sertraline (ZOLOFT) 50 MG tablet Take 50 mg by mouth in the morning.     spironolactone (ALDACTONE) 50 MG tablet Take 50 mg by mouth daily.      torsemide (DEMADEX) 20 MG tablet Take 1 tablet (20 mg total) by mouth daily as needed. (Patient taking differently: Take 20 mg by mouth daily as needed. Patient takes 1 tablet daily) 90 tablet 3   TURMERIC CURCUMIN PO Take 500  mg by mouth daily at 6 (six) AM.     Current Facility-Administered Medications  Medication Dose Route Frequency Provider Last Rate Last Admin   sodium phosphate (FLEET) 7-19 GM/118ML enema 1 enema  1 enema Rectal Once Danis, Andreas Blower, MD       Facility-Administered Medications Ordered in Other Visits  Medication Dose Route Frequency Provider Last Rate Last Admin   technetium pyrophosphate Tc 56m injection 21.2 millicurie  21.2 millicurie Intravenous Once Hilty, Lisette Abu, MD        Review of Systems  Constitutional:  Constitutional negative. HENT: HENT negative.  Eyes: Eyes negative.  Respiratory: Respiratory negative.  Cardiovascular: Cardiovascular negative.  GI: Gastrointestinal negative.  Musculoskeletal: Musculoskeletal negative.  Skin: Skin negative.  Neurological: Neurological negative. Hematologic: Hematologic/lymphatic negative.  Psychiatric: Psychiatric negative.        Objective:  Objective   Vitals:   08/04/23 0956  BP: (!) 157/70  Pulse: 71  Resp: 20  Temp: 98 F (36.7 C)  SpO2: 94%  Weight: 212 lb (96.2 kg)  Height: 5\' 3"  (1.6 m)   Body mass index is 37.55 kg/m.  Physical Exam Constitutional:      Appearance: She is obese.  HENT:     Head: Normocephalic.     Mouth/Throat:     Mouth: Mucous membranes are moist.  Eyes:     Pupils: Pupils are equal, round, and reactive to light.  Cardiovascular:     Rate and Rhythm: Normal rate.     Pulses:          Radial pulses are 2+ on the right side and 2+ on the left side.  Pulmonary:     Effort: Pulmonary effort is normal.  Abdominal:     General: Abdomen is flat.     Palpations: Abdomen is soft.     Comments: Well-healed midline abdominal scar  Musculoskeletal:     Comments:  Pulsatility and left upper arm fistula  Skin:    Capillary Refill: Capillary refill takes less than 2 seconds.  Neurological:     Mental Status: She is alert.  Psychiatric:        Mood and Affect: Mood normal.     Data: No studies     Assessment/Plan:     63 year old female with CKD 5 now here for evaluation of peritoneal dialysis catheter placement.  I evaluated her abdomen and we discussed that she would be high risk to have a catheter placed and fully functional.  This is secondary to previous abdominal surgeries and also her abdominal girth.  We discussed that we could place the catheter albeit with increased risk of injury to abdominal structures and primary nonfunction which would require removal of the catheter.  She would also need clearance by Dr. Ronelle Nigh whom she follows closely with.  She demonstrates good understanding of this and she will discuss with Dr. Ronelle Nigh and she will also discuss with her family given the high risk nature of this procedure and she will call to schedule if she decides to pursue laparoscopic continuous ambulatory PD catheter placement.  Otherwise she would likely require fistulogram of her left upper extremity access prior to use when she is requiring intermittent dialysis.     Maeola Harman MD [facility]

## 2023-08-04 NOTE — Telephone Encounter (Signed)
   Pre-operative Risk Assessment    Patient Name: Maria Hudson  DOB: 1961/03/28 MRN: [id number]   Date of last office visit: 02/02/23 Deliah Goody, FNP Date of next office visit: NONE   Request for Surgical Clearance    Procedure:   LAPAROSCOPIC CONTINUOUS AMBULATORY PP CATH  Date of Surgery:  Clearance TBD                                Surgeon:  DR. Lemar Livings Surgeon's Group or Practice Name:  [facility] Phone number:  [phone] Fax number:  [phone]   Type of Clearance Requested:   - Medical ; NO MEDICATIONS INDICATED ON CLEARANCE FORM TO BE HELD   Type of Anesthesia:  General    Additional requests/questions:    Elpidio Anis   08/04/2023, 12:47 PM

## 2023-08-04 NOTE — Telephone Encounter (Signed)
Shanda Bumps,  You saw this patient on 02/02/2023. She is followed by you and Dr. Shirlee Latch for diastolic heart failure, per office protocol, will you please comment on medical clearance for laparoscopic continuous ambulatory PP catheter?  Please route your response to P CV DIV Preop. I will communicate with requesting office once you have given recommendations.   Thank you!  Carlos Levering, NP

## 2023-08-06 ENCOUNTER — Other Ambulatory Visit: Payer: Self-pay

## 2023-08-06 ENCOUNTER — Telehealth (HOSPITAL_COMMUNITY): Payer: Self-pay | Admitting: *Deleted

## 2023-08-06 DIAGNOSIS — N185 Chronic kidney disease, stage 5: Secondary | ICD-10-CM

## 2023-08-06 MED ORDER — SODIUM CHLORIDE 0.9 % IV SOLN: 250.0000 mL | INTRAVENOUS | Status: AC | PRN

## 2023-08-06 NOTE — Telephone Encounter (Signed)
   Patient Name: Maria Hudson  DOB: April 29, 1961 MRN: [id number]  Primary Cardiologist: Jodelle Red, MD  Chart reviewed as part of pre-operative protocol coverage. Given past medical history and time since last visit, based on ACC/AHA guidelines, Maria Hudson is at acceptable risk for the planned procedure without further cardiovascular testing.   The patient was advised that if she develops new symptoms prior to surgery to contact our office to arrange for a follow-up visit, and she verbalized understanding.  I will route this recommendation to the requesting party via Epic fax function and remove from pre-op pool.  Please call with questions.  Joni Reining, NP 08/06/2023, 12:36 PM

## 2023-08-06 NOTE — Telephone Encounter (Signed)
Received urgent request via proficient from Dr Vallery Sa for fistulogram due to urgency for patient to begin dialysis.  Will give to [facility] and scan referral packet into media.

## 2023-08-09 ENCOUNTER — Ambulatory Visit (HOSPITAL_COMMUNITY)
Admission: RE | Admit: 2023-08-09 | Discharge: 2023-08-09 | Disposition: A | Discharge status: Home / Self Care | Payer: Medicaid Other | Attending: Vascular Surgery | Admitting: Vascular Surgery

## 2023-08-09 ENCOUNTER — Other Ambulatory Visit: Payer: Self-pay

## 2023-08-09 ENCOUNTER — Encounter (HOSPITAL_COMMUNITY)
Admission: RE | Disposition: A | Discharge status: Home / Self Care | Payer: Self-pay | Source: Home / Self Care | Attending: Vascular Surgery

## 2023-08-09 ENCOUNTER — Encounter (HOSPITAL_COMMUNITY): Payer: Self-pay | Admitting: Vascular Surgery

## 2023-08-09 DIAGNOSIS — Z7985 Long-term (current) use of injectable non-insulin antidiabetic drugs: Secondary | ICD-10-CM | POA: Diagnosis not present

## 2023-08-09 DIAGNOSIS — I132 Hypertensive heart and chronic kidney disease with heart failure and with stage 5 chronic kidney disease, or end stage renal disease: Secondary | ICD-10-CM | POA: Insufficient documentation

## 2023-08-09 DIAGNOSIS — T82898A Other specified complication of vascular prosthetic devices, implants and grafts, initial encounter: Secondary | ICD-10-CM

## 2023-08-09 DIAGNOSIS — I5032 Chronic diastolic (congestive) heart failure: Secondary | ICD-10-CM | POA: Diagnosis not present

## 2023-08-09 DIAGNOSIS — N185 Chronic kidney disease, stage 5: Secondary | ICD-10-CM | POA: Insufficient documentation

## 2023-08-09 DIAGNOSIS — Z79899 Other long term (current) drug therapy: Secondary | ICD-10-CM | POA: Diagnosis not present

## 2023-08-09 DIAGNOSIS — E1122 Type 2 diabetes mellitus with diabetic chronic kidney disease: Secondary | ICD-10-CM | POA: Insufficient documentation

## 2023-08-09 DIAGNOSIS — Z794 Long term (current) use of insulin: Secondary | ICD-10-CM | POA: Insufficient documentation

## 2023-08-09 HISTORY — PX: A/V FISTULAGRAM: CATH118298

## 2023-08-09 LAB — POCT I-STAT, CHEM 8
BUN: 127 mg/dL — ABNORMAL HIGH (ref 8–23)
Calcium, Ion: 1.2 mmol/L (ref 1.15–1.40)
Chloride: 114 mmol/L — ABNORMAL HIGH (ref 98–111)
Creatinine, Ser: 4.7 mg/dL — ABNORMAL HIGH (ref 0.44–1.00)
Glucose, Bld: 110 mg/dL — ABNORMAL HIGH (ref 70–99)
HCT: 36 % (ref 36.0–46.0)
Hemoglobin: 12.2 g/dL (ref 12.0–15.0)
Potassium: 4.2 mmol/L (ref 3.5–5.1)
Sodium: 141 mmol/L (ref 135–145)
TCO2: 17 mmol/L — ABNORMAL LOW (ref 22–32)

## 2023-08-09 LAB — GLUCOSE, CAPILLARY: Glucose-Capillary: 95 mg/dL (ref 70–99)

## 2023-08-09 LAB — NO BLOOD PRODUCTS

## 2023-08-09 SURGERY — A/V FISTULAGRAM
Anesthesia: LOCAL | Laterality: Left

## 2023-08-09 MED ORDER — LIDOCAINE HCL (PF) 1 % IJ SOLN
INTRAMUSCULAR | Status: AC
  Filled 2023-08-09: qty 30

## 2023-08-09 MED ORDER — SODIUM CHLORIDE 0.9% FLUSH: 3.0000 mL | Freq: Two times a day (BID) | INTRAVENOUS | Status: DC

## 2023-08-09 MED ORDER — HEPARIN (PORCINE) IN NACL 1000-0.9 UT/500ML-% IV SOLN
INTRAVENOUS | Status: DC | PRN
  Administered 2023-08-09: 500 mL

## 2023-08-09 MED ORDER — LIDOCAINE HCL (PF) 1 % IJ SOLN
INTRAMUSCULAR | Status: DC | PRN
  Administered 2023-08-09: 10 mL via SUBCUTANEOUS

## 2023-08-09 MED ORDER — SODIUM CHLORIDE 0.9% FLUSH: 3.0000 mL | INTRAVENOUS | Status: DC | PRN

## 2023-08-09 MED ORDER — IODIXANOL 320 MG/ML IV SOLN
INTRAVENOUS | Status: DC | PRN
  Administered 2023-08-09: 25 mL via INTRAVENOUS

## 2023-08-09 SURGICAL SUPPLY — 6 items
COVER DOME SNAP 22 D (MISCELLANEOUS) ×1 IMPLANT
KIT MICROPUNCTURE NIT STIFF (SHEATH) IMPLANT
SHEATH PROBE COVER 6X72 (BAG) ×1 IMPLANT
STOPCOCK MORSE 400PSI 3WAY (MISCELLANEOUS) IMPLANT
TRAY PV CATH (CUSTOM PROCEDURE TRAY) ×1 IMPLANT
TUBING CIL FLEX 10 FLL-RA (TUBING) IMPLANT

## 2023-08-09 NOTE — Interval H&P Note (Signed)
 History and Physical Interval Note:  08/09/2023 2:29 PM  Maria Hudson  has presented today for surgery, with the diagnosis of CKD 5 ICD 10 code: n18.5.  The various methods of treatment have been discussed with the patient and family. After consideration of risks, benefits and other options for treatment, the patient has consented to  Procedure(s): A/V Fistulagram (Left) as a surgical intervention.  The patient's history has been reviewed, patient examined, no change in status, stable for surgery.  I have reviewed the patient's chart and labs.  Questions were answered to the patient's satisfaction.     Lemar Livings

## 2023-08-09 NOTE — Op Note (Addendum)
    Patient name: Maria Hudson MRN: [id number] DOB: February 28, 1961 Sex: female  08/09/2023 Pre-operative Diagnosis: Chronic kidney disease stage V Post-operative diagnosis:  Same Surgeon:  Luanna Salk. Randie Heinz, MD Procedure Performed: 1.  Ultrasound-guided cannulation left arm cephalic vein fistula 2.  Left upper extremity fistulogram   Indications: 63 year old female with history of chronic kidney disease was sent for evaluation of peritoneal dialysis catheter placement but we have decided that this would be quite difficult particularly with her central obesity and history of heart failure requiring general anesthesia.  She is now sent for fistulogram prior to using her fistula for dialysis.  Findings: Fistula proximally was enlarged but was quite serpentine in the upper extremity and has multiple side branches.  Fistula is patent throughout its course and there is central patency as well.  I have discussed proceeding with superficialization with sidebranch ligation versus transposition and possibly placement of tunneled dialysis catheter in the near future.   Procedure:  The patient was identified in the holding area and taken to room 8.  The patient was then placed supine on the table and prepped and draped in the usual sterile fashion.  A time out was called.  Ultrasound was used to evaluate the left arm fistula.  The area was anesthetized 1% lidocaine and cannulated with a micropuncture needle followed by wire sheath.  We then performed left upper extremity fistulogram.  With the above findings she will require the above-noted procedure and possible placement of catheter at that time.  The cannulation site was suture-ligated with 4-0 Monocryl and the sheath was removed.  She tolerated the procedure without any complication.  Contrast: 25 cc    Saiquan Hands C. Randie Heinz, MD [facility] Office: [phone] Pager: [phone]

## 2023-08-10 ENCOUNTER — Other Ambulatory Visit: Payer: Self-pay

## 2023-08-10 ENCOUNTER — Telehealth: Payer: Self-pay

## 2023-08-10 ENCOUNTER — Encounter (HOSPITAL_COMMUNITY): Payer: Self-pay | Admitting: Vascular Surgery

## 2023-08-10 DIAGNOSIS — N185 Chronic kidney disease, stage 5: Secondary | ICD-10-CM

## 2023-08-10 NOTE — Progress Notes (Signed)
 Anesthesia Chart Review: Maria Hudson  Case: [id number] Date/Time: 08/11/23 1237   Procedure: FISTULA SUPERFICIALIZATION LEFT UPPER ARM (Left)   Anesthesia type: Choice   Pre-op diagnosis: CKD 5   Location: [facility] ROOM 11 / [facility] OR   Surgeons: Cephus Shelling, MD       DISCUSSION: Patient is a 63 year old female scheduled for the above procedure. She is nearly need for hemodialysis. LUE AVF created 12/09/21. Superficialization of AVF recommended following fistulogram on 08/09/23.   History includes never smoker, HTN (resistant, normal metanephrine/catecholamines and aldosterone 02/2021), DM2, CAD (CTO rPDA with left-to-right collaterals, 20% LAD, 50-60% LCX12/2014; stable anatomy 12/2018), chronic diastolic CHF, CKD (stage V; left brachiocephalic AVF 12/09/21), OSA (uses CPAP), hypercholesterolemia, CVA (> 10 years ago), memory loss (mild), anemia, dyspnea.  She has a remote history of DIFFICULT AIRWAY/INTUBATION ~ 2001. By notes, "Patient reports being told that she had a difficult airway to intubatewith ~2001 surgery; 'My mouth hurt worse than my incision'."  Intubation record from [facility] on 11/05/15 indicates oral 7.5 mm ETT placed, video laryngoscopy, Storz D-blade #3, easy ventilation by mask.    Last cardiology visit was on 02/02/23 with Prince Rome, FNP in the HF Clinic. Overall feeling "fine". Rare, atypical chest discomfort. Chronic DOE. Denied PND/orthopnea. No palpitations. Compliant with medications and CPAP. Clonidine increased to help with BP control. CAD symptoms stable, continue current meds. Volume status okay with NYHA class IIb symptoms. GDMT limited by CKD. Follow-up ~ 4 month planned.   She had preoperative cardiology input on 08/06/23 by Joni Reining, NP in anticipation for peritoneal dialysis catheter insertion. For that procedure she was classified as "acceptable risk for the planned procedure without further cardiovascular testing." However, vascular  surgery ultimately felt PD catheter placement would be quite difficult "particularly with her central obesity and history of heart failure requiring general anesthesia."   A1c 6.5% on 05/31/23. She wears a CGM. Reported fasting glucose ~ 95-120. She is on Lantus 0-10 units Q HS as needed, Farxiga 10 mg daily, Novolog 5 units TID with meals, Ozempic 2 mg weekly. Last Ozempic 07/27/23.   Anesthesia team to evaluate on the day of surgery. She has a clinical FYI: "Blood Products Refusal." H/H 12.2/36.0 on 08/09/23. For updated labs on arrival.     VS:  Wt Readings from Last 3 Encounters:  08/09/23 98.4 kg  08/04/23 96.2 kg  05/12/23 96 kg   BP Readings from Last 3 Encounters:  08/09/23 (!) 178/72  08/04/23 (!) 157/70  07/30/23 (!) 152/65   Pulse Readings from Last 3 Encounters:  08/09/23 73  08/04/23 71  07/30/23 71     PROVIDERS: Fleet Contras, MD is PCP  Chilton Si, MD is cardiologist ([facility]) Marca Ancona, MD is HF cardiologist Nicki Guadalajara, MD is cardiologist (OSA) Vallery Sa, MD is nephrologist   LABS: For day of surgery as indicated. See DISCUSSION.     SLEEP STUDY: 06/01/21: IMPRESSIONS - Moderate obstructive sleep apnea occurred during this study (AHI 17.3/h; RDI 19.8/h).Events were severe with supine sleep (AHI 40.0/h) and during REM sleep (AHI 57.0/h). - Significant oxygen desaturation to a nadir of 77.0%. - The patient snored with moderate snoring volume. - No cardiac abnormalities were noted during this study. - Clinically significant periodic limb movements did not occur during sleep. No significant associated arousals.     IMAGES: CT Abd/pelvis 05/03/23 [facility] CE): IMPRESSION: - Mild scattered calcified atherosclerotic disease, as described and relatively sparing the bilateral external iliac arteries.  -  Large colonic stool burden.    US Renal 05/03/23 [facility] CE): IMPRESSION: No evidence of hydronephrosis.    CXR 05/03/23 [facility]  CE): FINDINGS: Lungs hypoinflated. Stable mild pulmonary vascular congestion. No consolidation elsewhere.Marland Kitchen  No pleural effusion or pneumothorax.  Normal heart size and mediastinal contours.  IMPRESSION: No new acute airspace disease.      EKG: 10/13/22: NSR. Non-specific T wave abnormality.      CV: Myocardial Amyloid Imaging Planar & SPECT:   Myocardial uptake was positive for radiotracer uptake. The visual grade of myocardial uptake relative to the ribs was Grade 1 (Myocardial uptake less than rib uptake).   Findings are equivocal (Grade 1) of cardiac ATTR amyloidosis.   Prior study available for comparison from 05/27/2021.  No change compared to prior study reported at [facility] in 2020.    Echo 10/28/22: IMPRESSIONS   1. Left ventricular ejection fraction, by estimation, is 60 to 65%. The  left ventricle has normal function. The left ventricle has no regional  wall motion abnormalities. There is moderate left ventricular hypertrophy.  Left ventricular diastolic  parameters are consistent with Grade I diastolic dysfunction (impaired  relaxation).   2. Right ventricular systolic function is normal. The right ventricular  size is normal. There is normal pulmonary artery systolic pressure.   3. Left atrial size was mildly dilated.   4. The mitral valve is normal in structure. No evidence of mitral valve  regurgitation.   5. The aortic valve is normal in structure. Aortic valve regurgitation is  not visualized.   6. The inferior vena cava is normal in size with greater than 50%  respiratory variability, suggesting right atrial pressure of 3 mmHg.     RHC 05/14/21: 1. Normal filling pressures but with prominent v-waves in the PCWP tracing.  No significant MR on recent echo, suspect due to LV diastolic dysfunction.  2. Mild pulmonary venous hypertension.  Would screen for sleep apnea if not already done.  3. Preserved CO.      US Carotid 08/07/20 ([facility]): IMPRESSIONS:  1.   Atherosclerosis in bilateral cervical carotid arteries as described  above.  2.  R ICA: evidence of no stenosis by criteria.  3.  L ICA: evidence of no stenosis by criteria.  4.  Bilateral vertebral arteries demonstrate antegrade flow.      24-48 hour Holter Monitor 08/06/20 ([facility]): Impression: Rhythm is sinus from 64 to 106, average 77. No atrial fibrillation. No runs. No pauses. Rare PVC's. Frequent PAC's(7576).     RHC/LHC 12/22/18 ([facility] CE): NARRATIVE: 1. Single vessel obstructive CAD with CTO of rPDA which fills via left to right collaterals, no change in anatomy since last LHC  2. Severely elevated LVEDP  3. Moderate pulmonary HTN (secondary to left heart disease)  4. Moderate sedation with IV benadryl, fentanyl, and Versed for > 20 minutes personally supervised by me, no sedation issues   Recommendations:  1. Diuresis  2. Continue medical therapy and risk factor modification for stable CAD  - By notes, previous LHC 05/2016 showed: 20% mid LAD, CX with 50-60% in a large branch of a large Ramus INT, otherwise no significant LCX disease, 30% mid RCA, 100% rPDA with left-to-right collaterals.   Past Medical History:  Diagnosis Date   Acute on chronic diastolic heart failure (HCC) 03/05/2021   Anemia    Arthritis    CAD (coronary artery disease)    CHF (congestive heart failure) (HCC)    CKD (chronic kidney disease) stage 4,  GFR 15-29 ml/min (HCC) 03/05/2021   Colon polyps    CVA (cerebral vascular accident) (HCC)    Depression    Diabetes mellitus without complication (HCC)    type 2   Difficult intubation    Dyspnea    Hypertension    Hypertensive urgency 04/14/2021   Memory loss    mild   OSA (obstructive sleep apnea) 05/27/2021   uses CPAP 12-18 cm   Pneumonia    several times   Pure hypercholesterolemia 05/27/2021   Renal disorder    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   Stroke [facility])     Past Surgical History:  Procedure Laterality Date   A/V FISTULAGRAM  Left 08/09/2023   Procedure: A/V Fistulagram;  Surgeon: Maeola Harman, MD;  Location: [facility] INVASIVE CV LAB;  Service: Cardiovascular;  Laterality: Left;   ABDOMINAL HYSTERECTOMY     AV FISTULA PLACEMENT Left 12/09/2021   Procedure: LEFT ARTERIOVENOUS (AV) FISTULA CREATION;  Surgeon: Maeola Harman, MD;  Location: [facility] OR;  Service: Vascular;  Laterality: Left;   BIOPSY  09/08/2021   Procedure: BIOPSY;  Surgeon: Sherrilyn Rist, MD;  Location: [facility] ENDOSCOPY;  Service: Gastroenterology;;   CESAREAN SECTION     x 1   ESOPHAGOGASTRODUODENOSCOPY (EGD) WITH PROPOFOL N/A 09/08/2021   Procedure: ESOPHAGOGASTRODUODENOSCOPY (EGD) WITH PROPOFOL;  Surgeon: Sherrilyn Rist, MD;  Location: [facility] ENDOSCOPY;  Service: Gastroenterology;  Laterality: N/A;  chest pain, epigastric pain   INNER EAR SURGERY Bilateral    RIGHT HEART CATH N/A 05/14/2021   Procedure: RIGHT HEART CATH;  Surgeon: Laurey Morale, MD;  Location: [facility] INVASIVE CV LAB;  Service: Cardiovascular;  Laterality: N/A;   TONSILLECTOMY     age 50    MEDICATIONS:  sodium phosphate (FLEET) 7-19 GM/118ML enema 1 enema    acetaminophen (TYLENOL) 500 MG tablet   allopurinol (ZYLOPRIM) 100 MG tablet   amLODipine (NORVASC) 10 MG tablet   aspirin EC 81 MG tablet   atorvastatin (LIPITOR) 40 MG tablet   buPROPion (WELLBUTRIN) 100 MG tablet   carvedilol (COREG) 25 MG tablet   cholecalciferol (VITAMIN D3) 25 MCG (1000 UNIT) tablet   cloNIDine (CATAPRES) 0.2 MG tablet   Continuous Blood Gluc Transmit (DEXCOM G6 TRANSMITTER) MISC   dapagliflozin propanediol (FARXIGA) 10 MG TABS tablet   ferrous sulfate 325 (65 FE) MG tablet   fluticasone (FLONASE) 50 MCG/ACT nasal spray   hydrALAZINE (APRESOLINE) 100 MG tablet   insulin glargine (LANTUS) 100 unit/mL SOPN   isosorbide mononitrate (IMDUR) 120 MG 24 hr tablet   LINZESS 145 MCG CAPS capsule   loratadine (CLARITIN) 10 MG tablet   metolazone (ZAROXOLYN) 5 MG tablet   Multiple Vitamin  (MULTIVITAMIN ADULT) TABS   nitroGLYCERIN (NITROSTAT) 0.4 MG SL tablet   NOVOLOG FLEXPEN 100 UNIT/ML FlexPen   Omega-3 Fatty Acids (FISH OIL) 1200 MG CAPS   prednisoLONE acetate (PRED FORTE) 1 % ophthalmic suspension   pregabalin (LYRICA) 25 MG capsule   Semaglutide, 2 MG/DOSE, (OZEMPIC, 2 MG/DOSE,) 8 MG/3ML SOPN   sertraline (ZOLOFT) 50 MG tablet   spironolactone (ALDACTONE) 50 MG tablet   tiZANidine (ZANAFLEX) 4 MG tablet   torsemide (DEMADEX) 20 MG tablet   traZODone (DESYREL) 50 MG tablet   TURMERIC CURCUMIN PO   Accu-Chek Softclix Lancets lancets    technetium pyrophosphate Tc 79m injection 21.2 millicurie    Shonna Chock, PA-C Surgical Short Stay/Anesthesiology [facility] Phone [phone] [facility] Phone [phone] 08/10/2023 2:39 PM

## 2023-08-10 NOTE — Progress Notes (Signed)
 SDW CALL  Patient was given pre-op instructions over the phone. The opportunity was given for the patient to ask questions. No further questions asked. Patient verbalized understanding of instructions given.   PCP - Dr. Susy Manor Cardiologist - Dr. Shirlee Latch  PPM/ICD - denies   Chest x-ray - denies EKG - 10/13/22 Stress Test - denies ECHO - 10/28/22 Cardiac Cath - 05/14/21  Sleep Study - OSA+ and wears CPAP every night  Fasting Blood Sugar - 95-120 Patient wears a continue glucose monitor in her lower left abdomen  Last dose of GLP1 agonist-  Ozempic - 2/11 GLP1 instructions: patient aware not to take prior to procedure  Blood Thinner Instructions: n/a Aspirin Instructions: n/a  ERAS Protcol - NPO PRE-SURGERY Ensure or G2- n/a  COVID TEST- n/a   Anesthesia review: yes - cardiac history, GLP1, difficult intubation per patient   Patient denies shortness of breath, fever, cough and chest pain over the phone call   All instructions explained to the patient, with a verbal understanding of the material. Patient agrees to go over the instructions while at home for a better understanding.    Patient states that she was told she was a difficult intubation after her hysterectomy at [facility] but has not been told that since.     Patient states that her last dose of nitroglycerin was approximately 3 months ago.

## 2023-08-10 NOTE — Telephone Encounter (Signed)
 Returned [employer] call and left VM letting her know pt is scheduled tomorrow for superficalization with likely [facility] placement and to call us back if she had further questions/concerns.

## 2023-08-10 NOTE — Anesthesia Preprocedure Evaluation (Signed)
 Anesthesia Evaluation  Patient identified by MRN, date of birth, ID band Patient awake    Reviewed: Allergy & Precautions, NPO status , Patient's Chart, lab work & pertinent test results  History of Anesthesia Complications Negative for: history of anesthetic complications  Airway Mallampati: III  TM Distance: >3 FB Neck ROM: Full    Dental  (+) Dental Advisory Given   Pulmonary neg pulmonary ROS   Pulmonary exam normal        Cardiovascular hypertension, Pt. on medications Normal cardiovascular exam     Neuro/Psych negative neurological ROS  negative psych ROS   GI/Hepatic negative GI ROS, Neg liver ROS,,,  Endo/Other  diabetes, Poorly Controlled, Type 2, Oral Hypoglycemic Agents, Insulin DependentHypothyroidism    Renal/GU negative Renal ROS     Musculoskeletal negative musculoskeletal ROS (+)    Abdominal   Peds  Hematology negative hematology ROS (+)   Anesthesia Other Findings   Reproductive/Obstetrics                             Anesthesia Physical Anesthesia Plan  ASA: 3  Anesthesia Plan: General   Post-op Pain Management: Tylenol PO (pre-op)*   Induction: Intravenous  PONV Risk Score and Plan: 2 and Treatment may vary due to age or medical condition, Ondansetron and TIVA  Airway Management Planned: Oral ETT and Video Laryngoscope Planned  Additional Equipment: None  Intra-op Plan:   Post-operative Plan: Extubation in OR  Informed Consent: I have reviewed the patients History and Physical, chart, labs and discussed the procedure including the risks, benefits and alternatives for the proposed anesthesia with the patient or authorized representative who has indicated his/her understanding and acceptance.     Dental advisory given  Plan Discussed with: CRNA and Anesthesiologist  Anesthesia Plan Comments: (PAT note written 08/10/2023 by Shonna Chock, PA-C.  )        Anesthesia Quick Evaluation

## 2023-08-11 ENCOUNTER — Emergency Department (HOSPITAL_COMMUNITY)
Admission: EM | Admit: 2023-08-11 | Discharge: 2023-08-11 | Discharge status: Against Medical Advice | Payer: Medicaid Other

## 2023-08-11 ENCOUNTER — Other Ambulatory Visit: Payer: Self-pay

## 2023-08-11 ENCOUNTER — Encounter (HOSPITAL_COMMUNITY)
Admission: RE | Disposition: A | Discharge status: Home / Self Care | Payer: Self-pay | Source: Home / Self Care | Attending: Vascular Surgery

## 2023-08-11 ENCOUNTER — Ambulatory Visit (HOSPITAL_COMMUNITY): Payer: Medicaid Other

## 2023-08-11 ENCOUNTER — Ambulatory Visit (HOSPITAL_COMMUNITY): Payer: Self-pay | Admitting: Vascular Surgery

## 2023-08-11 ENCOUNTER — Ambulatory Visit (HOSPITAL_COMMUNITY)
Admission: RE | Admit: 2023-08-11 | Discharge: 2023-08-11 | Disposition: A | Discharge status: Home / Self Care | Payer: Medicaid Other | Attending: Vascular Surgery | Admitting: Vascular Surgery

## 2023-08-11 ENCOUNTER — Encounter (HOSPITAL_COMMUNITY): Payer: Self-pay | Admitting: Vascular Surgery

## 2023-08-11 ENCOUNTER — Ambulatory Visit (HOSPITAL_BASED_OUTPATIENT_CLINIC_OR_DEPARTMENT_OTHER): Payer: Self-pay | Admitting: Vascular Surgery

## 2023-08-11 DIAGNOSIS — I2510 Atherosclerotic heart disease of native coronary artery without angina pectoris: Secondary | ICD-10-CM | POA: Diagnosis not present

## 2023-08-11 DIAGNOSIS — Z8673 Personal history of transient ischemic attack (TIA), and cerebral infarction without residual deficits: Secondary | ICD-10-CM | POA: Diagnosis not present

## 2023-08-11 DIAGNOSIS — Z90710 Acquired absence of both cervix and uterus: Secondary | ICD-10-CM | POA: Insufficient documentation

## 2023-08-11 DIAGNOSIS — Z452 Encounter for adjustment and management of vascular access device: Secondary | ICD-10-CM | POA: Diagnosis not present

## 2023-08-11 DIAGNOSIS — Z7985 Long-term (current) use of injectable non-insulin antidiabetic drugs: Secondary | ICD-10-CM | POA: Insufficient documentation

## 2023-08-11 DIAGNOSIS — G4733 Obstructive sleep apnea (adult) (pediatric): Secondary | ICD-10-CM

## 2023-08-11 DIAGNOSIS — F32A Depression, unspecified: Secondary | ICD-10-CM | POA: Insufficient documentation

## 2023-08-11 DIAGNOSIS — Z9851 Tubal ligation status: Secondary | ICD-10-CM | POA: Diagnosis not present

## 2023-08-11 DIAGNOSIS — Z992 Dependence on renal dialysis: Secondary | ICD-10-CM | POA: Insufficient documentation

## 2023-08-11 DIAGNOSIS — E1122 Type 2 diabetes mellitus with diabetic chronic kidney disease: Secondary | ICD-10-CM | POA: Insufficient documentation

## 2023-08-11 DIAGNOSIS — N185 Chronic kidney disease, stage 5: Secondary | ICD-10-CM

## 2023-08-11 DIAGNOSIS — I132 Hypertensive heart and chronic kidney disease with heart failure and with stage 5 chronic kidney disease, or end stage renal disease: Secondary | ICD-10-CM | POA: Insufficient documentation

## 2023-08-11 DIAGNOSIS — I5032 Chronic diastolic (congestive) heart failure: Secondary | ICD-10-CM | POA: Diagnosis not present

## 2023-08-11 DIAGNOSIS — E7800 Pure hypercholesterolemia, unspecified: Secondary | ICD-10-CM | POA: Diagnosis not present

## 2023-08-11 DIAGNOSIS — I5033 Acute on chronic diastolic (congestive) heart failure: Secondary | ICD-10-CM | POA: Diagnosis not present

## 2023-08-11 DIAGNOSIS — N186 End stage renal disease: Secondary | ICD-10-CM | POA: Insufficient documentation

## 2023-08-11 DIAGNOSIS — Z794 Long term (current) use of insulin: Secondary | ICD-10-CM | POA: Diagnosis not present

## 2023-08-11 DIAGNOSIS — Z7984 Long term (current) use of oral hypoglycemic drugs: Secondary | ICD-10-CM | POA: Diagnosis not present

## 2023-08-11 HISTORY — PX: INSERTION OF DIALYSIS CATHETER: SHX1324

## 2023-08-11 HISTORY — PX: FISTULA SUPERFICIALIZATION: SHX6341

## 2023-08-11 HISTORY — DX: Failed or difficult intubation, initial encounter: T88.4XXA

## 2023-08-11 LAB — POCT I-STAT, CHEM 8
BUN: 110 mg/dL — ABNORMAL HIGH (ref 8–23)
Calcium, Ion: 1.19 mmol/L (ref 1.15–1.40)
Chloride: 111 mmol/L (ref 98–111)
Creatinine, Ser: 4.5 mg/dL — ABNORMAL HIGH (ref 0.44–1.00)
Glucose, Bld: 96 mg/dL (ref 70–99)
HCT: 35 % — ABNORMAL LOW (ref 36.0–46.0)
Hemoglobin: 11.9 g/dL — ABNORMAL LOW (ref 12.0–15.0)
Potassium: 4.8 mmol/L (ref 3.5–5.1)
Sodium: 140 mmol/L (ref 135–145)
TCO2: 18 mmol/L — ABNORMAL LOW (ref 22–32)

## 2023-08-11 LAB — GLUCOSE, CAPILLARY
Glucose-Capillary: 83 mg/dL (ref 70–99)
Glucose-Capillary: 84 mg/dL (ref 70–99)
Glucose-Capillary: 87 mg/dL (ref 70–99)

## 2023-08-11 SURGERY — FISTULA SUPERFICIALIZATION
Anesthesia: General | Site: Neck | Laterality: Right

## 2023-08-11 MED ORDER — CHLORHEXIDINE GLUCONATE 0.12 % MT SOLN
OROMUCOSAL | Status: AC
Start: 2023-08-11 — End: 2023-08-11
  Administered 2023-08-11: 15 mL via OROMUCOSAL
  Filled 2023-08-11: qty 15

## 2023-08-11 MED ORDER — CHLORHEXIDINE GLUCONATE 4 % EX SOLN: 60.0000 mL | Freq: Once | CUTANEOUS | Status: DC

## 2023-08-11 MED ORDER — HEPARIN 6000 UNIT IRRIGATION SOLUTION
Freq: Once | Status: DC
  Filled 2023-08-11: qty 500

## 2023-08-11 MED ORDER — 0.9 % SODIUM CHLORIDE (POUR BTL) OPTIME
TOPICAL | Status: DC | PRN
  Administered 2023-08-11: 1000 mL

## 2023-08-11 MED ORDER — FENTANYL CITRATE (PF) 250 MCG/5ML IJ SOLN
INTRAMUSCULAR | Status: AC
  Filled 2023-08-11: qty 5

## 2023-08-11 MED ORDER — FENTANYL CITRATE (PF) 250 MCG/5ML IJ SOLN
INTRAMUSCULAR | Status: DC | PRN
  Administered 2023-08-11: 50 ug via INTRAVENOUS
  Administered 2023-08-11 (×2): 25 ug via INTRAVENOUS

## 2023-08-11 MED ORDER — INSULIN ASPART 100 UNIT/ML IJ SOLN: 0.0000 [IU] | INTRAMUSCULAR | Status: DC | PRN

## 2023-08-11 MED ORDER — CHLORHEXIDINE GLUCONATE 0.12 % MT SOLN: 15.0000 mL | Freq: Once | OROMUCOSAL | Status: AC

## 2023-08-11 MED ORDER — FENTANYL CITRATE (PF) 100 MCG/2ML IJ SOLN
INTRAMUSCULAR | Status: AC
  Filled 2023-08-11: qty 2

## 2023-08-11 MED ORDER — TRAMADOL HCL 50 MG PO TABS: 50.0000 mg | ORAL_TABLET | Freq: Two times a day (BID) | ORAL | 0 refills | Status: DC | PRN

## 2023-08-11 MED ORDER — SODIUM CHLORIDE 0.9% FLUSH: 3.0000 mL | INTRAVENOUS | Status: DC | PRN

## 2023-08-11 MED ORDER — CEFAZOLIN SODIUM 1 G IJ SOLR
INTRAMUSCULAR | Status: AC
  Filled 2023-08-11: qty 30

## 2023-08-11 MED ORDER — CEFAZOLIN SODIUM-DEXTROSE 2-4 GM/100ML-% IV SOLN
2.0000 g | INTRAVENOUS | Status: AC
  Administered 2023-08-11: 2 g via INTRAVENOUS
  Filled 2023-08-11: qty 100

## 2023-08-11 MED ORDER — HEPARIN SODIUM (PORCINE) 1000 UNIT/ML IJ SOLN
INTRAMUSCULAR | Status: AC
  Filled 2023-08-11: qty 10

## 2023-08-11 MED ORDER — LIDOCAINE 2% (20 MG/ML) 5 ML SYRINGE
INTRAMUSCULAR | Status: DC | PRN
Start: 2023-08-11 — End: 2023-08-11
  Administered 2023-08-11: 100 mg via INTRAVENOUS

## 2023-08-11 MED ORDER — HEPARIN SODIUM (PORCINE) 1000 UNIT/ML IJ SOLN
INTRAMUSCULAR | Status: DC | PRN
  Administered 2023-08-11: 5000 [IU] via INTRAVENOUS

## 2023-08-11 MED ORDER — SODIUM CHLORIDE 0.9 % IV SOLN: INTRAVENOUS | Status: DC

## 2023-08-11 MED ORDER — HEPARIN 6000 UNIT IRRIGATION SOLUTION
Status: DC | PRN
  Administered 2023-08-11: 1

## 2023-08-11 MED ORDER — SODIUM CHLORIDE 0.9% FLUSH: 3.0000 mL | Freq: Two times a day (BID) | INTRAVENOUS | Status: DC

## 2023-08-11 MED ORDER — DEXAMETHASONE SODIUM PHOSPHATE 10 MG/ML IJ SOLN
INTRAMUSCULAR | Status: DC | PRN
  Administered 2023-08-11: 4 mg via INTRAVENOUS

## 2023-08-11 MED ORDER — MIDAZOLAM HCL 2 MG/2ML IJ SOLN
INTRAMUSCULAR | Status: DC | PRN
Start: 2023-08-11 — End: 2023-08-11
  Administered 2023-08-11: 2 mg via INTRAVENOUS

## 2023-08-11 MED ORDER — FENTANYL CITRATE (PF) 100 MCG/2ML IJ SOLN
25.0000 ug | INTRAMUSCULAR | Status: DC | PRN
  Administered 2023-08-11 (×2): 25 ug via INTRAVENOUS
  Administered 2023-08-11: 50 ug via INTRAVENOUS

## 2023-08-11 MED ORDER — ONDANSETRON HCL 4 MG/2ML IJ SOLN: 4.0000 mg | Freq: Once | INTRAMUSCULAR | Status: DC | PRN

## 2023-08-11 MED ORDER — ONDANSETRON HCL 4 MG/2ML IJ SOLN
INTRAMUSCULAR | Status: AC
  Filled 2023-08-11: qty 2

## 2023-08-11 MED ORDER — HEPARIN SODIUM (PORCINE) 1000 UNIT/ML IJ SOLN
INTRAMUSCULAR | Status: DC | PRN
  Administered 2023-08-11: 3200 [IU]

## 2023-08-11 MED ORDER — ONDANSETRON HCL 4 MG/2ML IJ SOLN
INTRAMUSCULAR | Status: DC | PRN
  Administered 2023-08-11: 4 mg via INTRAVENOUS

## 2023-08-11 MED ORDER — HEPARIN SODIUM (PORCINE) 1000 UNIT/ML IJ SOLN
INTRAMUSCULAR | Status: AC
  Filled 2023-08-11: qty 40

## 2023-08-11 MED ORDER — MIDAZOLAM HCL 2 MG/2ML IJ SOLN
INTRAMUSCULAR | Status: AC
  Filled 2023-08-11: qty 2

## 2023-08-11 MED ORDER — LIDOCAINE 2% (20 MG/ML) 5 ML SYRINGE
INTRAMUSCULAR | Status: AC
  Filled 2023-08-11: qty 5

## 2023-08-11 MED ORDER — PHENYLEPHRINE 80 MCG/ML (10ML) SYRINGE FOR IV PUSH (FOR BLOOD PRESSURE SUPPORT)
PREFILLED_SYRINGE | INTRAVENOUS | Status: AC
  Filled 2023-08-11: qty 10

## 2023-08-11 MED ORDER — ORAL CARE MOUTH RINSE: 15.0000 mL | Freq: Once | OROMUCOSAL | Status: AC

## 2023-08-11 MED ORDER — PROPOFOL 10 MG/ML IV BOLUS
INTRAVENOUS | Status: DC | PRN
  Administered 2023-08-11: 150 mg via INTRAVENOUS

## 2023-08-11 SURGICAL SUPPLY — 38 items
ARMBAND PINK RESTRICT EXTREMIT (MISCELLANEOUS) ×2 IMPLANT
BAG COUNTER SPONGE SURGICOUNT (BAG) ×2 IMPLANT
BIOPATCH RED 1 DISK 7.0 (GAUZE/BANDAGES/DRESSINGS) IMPLANT
BNDG ELASTIC 4INX 5YD STR LF (GAUZE/BANDAGES/DRESSINGS) IMPLANT
BNDG ELASTIC 6X10 VLCR STRL LF (GAUZE/BANDAGES/DRESSINGS) IMPLANT
BNDG GAUZE DERMACEA FLUFF 4 (GAUZE/BANDAGES/DRESSINGS) IMPLANT
CANISTER SUCT 3000ML PPV (MISCELLANEOUS) ×2 IMPLANT
CATH PALINDROME-P 19CM W/VT (CATHETERS) IMPLANT
CLIP TI MEDIUM 6 (CLIP) ×2 IMPLANT
CLIP TI WIDE RED SMALL 6 (CLIP) ×2 IMPLANT
COVER PROBE W GEL 5X96 (DRAPES) IMPLANT
DERMABOND ADVANCED .7 DNX12 (GAUZE/BANDAGES/DRESSINGS) ×2 IMPLANT
ELECT REM PT RETURN 9FT ADLT (ELECTROSURGICAL) ×2 IMPLANT
ELECTRODE REM PT RTRN 9FT ADLT (ELECTROSURGICAL) ×2 IMPLANT
GAUZE SPONGE 4X4 12PLY STRL (GAUZE/BANDAGES/DRESSINGS) IMPLANT
GAUZE XEROFORM 1X8 LF (GAUZE/BANDAGES/DRESSINGS) IMPLANT
GLOVE BIO SURGEON STRL SZ7.5 (GLOVE) ×2 IMPLANT
GLOVE BIOGEL PI IND STRL 8 (GLOVE) ×2 IMPLANT
GOWN STRL REUS W/ TWL LRG LVL3 (GOWN DISPOSABLE) ×4 IMPLANT
GOWN STRL REUS W/ TWL XL LVL3 (GOWN DISPOSABLE) ×4 IMPLANT
HEMOSTAT SPONGE AVITENE ULTRA (HEMOSTASIS) IMPLANT
KIT BASIN OR (CUSTOM PROCEDURE TRAY) ×2 IMPLANT
KIT TURNOVER KIT B (KITS) ×2 IMPLANT
NS IRRIG 1000ML POUR BTL (IV SOLUTION) ×2 IMPLANT
PACK CV ACCESS (CUSTOM PROCEDURE TRAY) ×2 IMPLANT
PAD ARMBOARD 7.5X6 YLW CONV (MISCELLANEOUS) ×4 IMPLANT
SLING ARM FOAM STRAP LRG (SOFTGOODS) IMPLANT
SLING ARM FOAM STRAP MED (SOFTGOODS) IMPLANT
SPIKE FLUID TRANSFER (MISCELLANEOUS) ×2 IMPLANT
STAPLER SKIN PROX WIDE 3.9 (STAPLE) IMPLANT
SUT MNCRL AB 4-0 PS2 18 (SUTURE) ×2 IMPLANT
SUT PROLENE 6 0 BV (SUTURE) IMPLANT
SUT PROLENE 7 0 BV 1 (SUTURE) ×2 IMPLANT
SUT SILK 2 0 PERMA HAND 18 BK (SUTURE) IMPLANT
SUT VIC AB 3-0 SH 27X BRD (SUTURE) ×4 IMPLANT
TOWEL GREEN STERILE (TOWEL DISPOSABLE) ×2 IMPLANT
UNDERPAD 30X36 HEAVY ABSORB (UNDERPADS AND DIAPERS) ×2 IMPLANT
WATER STERILE IRR 1000ML POUR (IV SOLUTION) ×2 IMPLANT

## 2023-08-11 NOTE — Discharge Instructions (Signed)
   [facility]  Discharge Instructions  AV Fistula or Graft Surgery for Dialysis Access  Please refer to the following instructions for your post-procedure care. Your surgeon or physician assistant will discuss any changes with you.  Activity  You may drive the day following your surgery, if you are comfortable and no longer taking prescription pain medication. Resume full activity as the soreness in your incision resolves.  Bathing/Showering  You may shower after you go home. Keep your incision dry for 48 hours. Do not soak in a bathtub, hot tub, or swim until the incision heals completely. You may not shower if you have a hemodialysis catheter.  Incision Care  Clean your incision with mild soap and water after 48 hours. Pat the area dry with a clean towel. You do not need a bandage unless otherwise instructed. Do not apply any ointments or creams to your incision. You may have skin glue on your incision. Do not peel it off. It will come off on its own in about one week. Your arm may swell a bit after surgery. To reduce swelling use pillows to elevate your arm so it is above your heart. Your doctor will tell you if you need to lightly wrap your arm with an ACE bandage.  Diet  Resume your normal diet. There are not special food restrictions following this procedure. In order to heal from your surgery, it is CRITICAL to get adequate nutrition. Your body requires vitamins, minerals, and protein. Vegetables are the best source of vitamins and minerals. Vegetables also provide the perfect balance of protein. Processed food has little nutritional value, so try to avoid this.  Medications  Resume taking all of your medications. If your incision is causing pain, you may take over-the counter pain relievers such as acetaminophen (Tylenol). If you were prescribed a stronger pain medication, please be aware these medications can cause nausea and constipation. Prevent  nausea by taking the medication with a snack or meal. Avoid constipation by drinking plenty of fluids and eating foods with high amount of fiber, such as fruits, vegetables, and grains.  Do not take Tylenol if you are taking prescription pain medications.  Follow up Your surgeon may want to see you in the office following your access surgery. If so, this will be arranged at the time of your surgery.  Please call us immediately for any of the following conditions:  Increased pain, redness, drainage (pus) from your incision site Fever of 101 degrees or higher Severe or worsening pain at your incision site Hand pain or numbness.  Reduce your risk of vascular disease:  Stop smoking. If you would like help, call [facility] at 1-800-QUIT-NOW (760-207-3549) or [place] at [phone]  Manage your cholesterol Maintain a desired weight Control your diabetes Keep your blood pressure down  Dialysis  It will take several weeks to several months for your new dialysis access to be ready for use. Your surgeon will determine when it is okay to use it. Your nephrologist will continue to direct your dialysis. You can continue to use your Permcath until your new access is ready for use.   08/11/2023 Maria Hudson [id number] 1961/04/02  Surgeon(s): Leonie Douglas, MD  Procedure(s): FISTULA SUPERFICIALIZATION LEFT UPPER ARM INSERTION OF DIALYSIS CATHETER  x Do not stick fistula  until you have been seen back in our office in 2-3 weeks.     If you have any questions, please call the office at [phone].

## 2023-08-11 NOTE — Op Note (Signed)
 DATE OF SERVICE: 08/11/2023  PATIENT:  Maria Hudson  63 y.o. female  PRE-OPERATIVE DIAGNOSIS:  ESRD  POST-OPERATIVE DIAGNOSIS:  Same  PROCEDURE:   1) placement of right internal jugular tunneled dialysis catheter (19cm) under ultrasound and fluoroscopic guidance 2) left arm arteriovenous fistula revision  SURGEON:  Surgeons and Role:    * Leonie Douglas, MD - Primary  ASSISTANT: Nathanial Rancher, PA-C  An experienced assistant was required given the complexity of this procedure and the standard of surgical care. My assistant helped with exposure through counter tension, suctioning, ligation and retraction to better visualize the surgical field.  My assistant expedited sewing during the case by following my sutures. Wherever I use the term "we" in the report, my assistant actively helped me with that portion of the procedure.  ANESTHESIA:   general  EBL: minimal  BLOOD ADMINISTERED:none  DRAINS: none   LOCAL MEDICATIONS USED:  NONE  SPECIMEN:  none  COUNTS: confirmed correct.  TOURNIQUET:  none  PATIENT DISPOSITION:  PACU - hemodynamically stable.   Delay start of Pharmacological VTE agent (>24hrs) due to surgical blood loss or risk of bleeding: no  INDICATION FOR PROCEDURE: Maria Hudson is a 63 y.o. female with ESRD and difficult to cannulate fistula. After careful discussion of risks, benefits, and alternatives the patient was offered fistula revision and TDC. The patient understood and wished to proceed.  OPERATIVE FINDINGS: Successful placement of TDC. Multiple large sidebranches. New tunnel created. Good thrill in fistula at completion.  DESCRIPTION OF PROCEDURE: After identification of the patient in the pre-operative holding area, the patient was transferred to the operating room. The patient was positioned supine on the operating room table. Anesthesia was induced. The neck and left arm was prepped and draped in standard fashion. A surgical pause was performed  confirming correct patient, procedure, and operative location.  Using ultrasound guidance the right internal jugular vein was accessed with micropuncture technique.  Through the micropuncture sheath a floppy J-wire was advanced into the superior vena cava.  A small incision was made around the skin access point.  The access point was serially dilated under direct fluoroscopic guidance.  A peel-away sheath was introduced into the superior vena cava under fluoroscopic guidance.  A counterincision was made in the chest under the clavicle.  A 19 cm tunnel dialysis catheter was then tunneled under the skin, over the clavicle into the incision in the neck.  The tunneling device was removed and the catheter fed through the peel-away sheath into the superior vena cava.  The peel-away sheath was removed and the catheter gently pulled back.  Adequate position was confirmed with x-ray.  The catheter was tested and found to flush and draw back well.  Catheter was heparin locked.  Caps were applied.  Catheter was sutured to the skin.  The neck incision was closed with 4-0 Monocryl.  2 skip incisions were made over the course of the fistula to expose the fistula throughout the length from of the anastomosis to the deltopectoral fascia.  The patient was skeletonized.  2 large sidebranches were identified and divided between silk ligature.  A Gore tunneler was used to create a new subcutaneous tunnel lateral to the existing fistula path.  The fascia was clamped between Gregory clamps and divided.  The fistula was delivered through the new tunnel taking great care to avoid twisting or kinking the graft.  The fistula was then reanastomosed to itself using 6-0 Prolene suture in an end-to-end fashion.  Good technical  result was achieved.  Good thrill was noted in the fistula.  This was confirmed with Doppler machine.  Hemostasis was achieved in the wound beds.  The wound was closed with 3-0 Vicryl and skin stapler.  Upon  completion of the case instrument and sharps counts were confirmed correct. The patient was transferred to the PACU in good condition. I was present for all portions of the procedure.  FOLLOW UP PLAN: Assuming a normal postoperative course, VVS PA will see the patient in 2-3 weeks with AV fistula duplex.  Rande Brunt. Lenell Antu, MD [facility] Phone Number: [phone] 08/11/2023 5:25 PM

## 2023-08-11 NOTE — Transfer of Care (Signed)
 Immediate Anesthesia Transfer of Care Note  Patient: Maria Hudson  Procedure(s) Performed: FISTULA SUPERFICIALIZATION LEFT UPPER ARM (Left) INSERTION OF DIALYSIS CATHETER (Right: Neck)  Patient Location: PACU  Anesthesia Type:General  Level of Consciousness: awake, alert , oriented, patient cooperative, and responds to stimulation  Airway & Oxygen Therapy: Patient Spontanous Breathing  Post-op Assessment: Report given to RN, Post -op Vital signs reviewed and stable, and Patient moving all extremities X 4  Post vital signs: Reviewed and stable  Last Vitals:  Vitals Value Taken Time  BP 172/69 08/11/23 1745  Temp    Pulse 70 08/11/23 1747  Resp 11 08/11/23 1747  SpO2 98 % 08/11/23 1747  Vitals shown include unfiled device data.  Last Pain:  Vitals:   08/11/23 1119  TempSrc:   PainSc: 0-No pain         Complications: No notable events documented.

## 2023-08-11 NOTE — Anesthesia Procedure Notes (Signed)
 Procedure Name: LMA Insertion Date/Time: 08/11/2023 4:01 PM  Performed by: Darlina Guys, CRNAPre-anesthesia Checklist: Patient identified, Emergency Drugs available, Suction available and Patient being monitored Patient Re-evaluated:Patient Re-evaluated prior to induction Oxygen Delivery Method: Circle System Utilized Preoxygenation: Pre-oxygenation with 100% oxygen Induction Type: IV induction LMA: LMA inserted LMA Size: 4.0 Number of attempts: 1 Placement Confirmation: positive ETCO2 Tube secured with: Tape Dental Injury: Teeth and Oropharynx as per pre-operative assessment  Comments: Atraumatic induction/LMA insertion. Dentition as per preop.

## 2023-08-11 NOTE — ED Notes (Signed)
 Patient stated they could not wait any longer "if it was taking this long for triage"- pulled OTF.

## 2023-08-11 NOTE — Interval H&P Note (Signed)
 History and Physical Interval Note:  08/11/2023 3:00 PM  Maria Hudson  has presented today for surgery, with the diagnosis of CKD 5.  The various methods of treatment have been discussed with the patient and family. After consideration of risks, benefits and other options for treatment, the patient has consented to  Procedure(s): FISTULA SUPERFICIALIZATION LEFT UPPER ARM (Left) as a surgical intervention.  The patient's history has been reviewed, patient examined, no change in status, stable for surgery.  I have reviewed the patient's chart and labs.  Questions were answered to the patient's satisfaction.     Leonie Douglas

## 2023-08-12 ENCOUNTER — Telehealth: Payer: Self-pay

## 2023-08-12 ENCOUNTER — Encounter (HOSPITAL_COMMUNITY): Payer: Self-pay | Admitting: Vascular Surgery

## 2023-08-12 NOTE — Anesthesia Postprocedure Evaluation (Signed)
 Anesthesia Post Note  Patient: Maria Hudson  Procedure(s) Performed: FISTULA SUPERFICIALIZATION LEFT UPPER ARM (Left) INSERTION OF DIALYSIS CATHETER (Right: Neck)     Patient location during evaluation: PACU Anesthesia Type: General Level of consciousness: awake and alert Pain management: pain level controlled Vital Signs Assessment: post-procedure vital signs reviewed and stable Respiratory status: spontaneous breathing, nonlabored ventilation, respiratory function stable and patient connected to nasal cannula oxygen Cardiovascular status: blood pressure returned to baseline and stable Postop Assessment: no apparent nausea or vomiting Anesthetic complications: no   No notable events documented.  Last Vitals:  Vitals:   08/11/23 1830 08/11/23 1845  BP: (!) 151/67 139/65  Pulse: 64 65  Resp: 10 11  Temp:  36.6 C  SpO2: 97% 95%    Last Pain:  Vitals:   08/11/23 1845  TempSrc:   PainSc: 3                  Mariann Barter

## 2023-08-12 NOTE — Telephone Encounter (Signed)
 Triage Call:  -pt called to inform us that when she was at the hospital yesterday after her procedure she discharged, her arm was bleeding and they just added more gauze, wrapped it more and sent her home.  When she got home she realized her arm was still bleeding and they went to the ED, put on the wait list and eventually left because she would not stop bleeding.  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   -she will keep the compression dressing on for the 48 hours, proceed with caution when taking it off.   -she will call the office if she has any problems

## 2023-08-13 NOTE — Telephone Encounter (Signed)
 error

## 2023-08-14 DIAGNOSIS — Z419 Encounter for procedure for purposes other than remedying health state, unspecified: Secondary | ICD-10-CM | POA: Diagnosis not present

## 2023-08-17 ENCOUNTER — Other Ambulatory Visit: Payer: Self-pay | Admitting: Obstetrics and Gynecology

## 2023-08-17 NOTE — Patient Outreach (Signed)
 Care Coordination  08/17/2023  Rosie Torrez [birth year] [id number]  RNCM called patient at scheduled time.  Patient answered phone and stated she was at dialysis speaking with SW.  Patient asked to be called back another time.  RNCM rescheduled appointment at patient request.  Kathi Der RN, BSN, CM [facility] RN Care Manager Direct Dial [phone]/NUU [phone] Website: [email or web address]

## 2023-08-23 ENCOUNTER — Other Ambulatory Visit: Payer: Self-pay | Admitting: Obstetrics and Gynecology

## 2023-08-23 ENCOUNTER — Telehealth: Payer: Self-pay

## 2023-08-23 NOTE — Patient Outreach (Addendum)
 Medicaid Managed Care   Nurse Care Manager Note  08/23/2023 Name:  Maria Hudson MRN:  [id number] DOB:  April 23, 1961  Maria Hudson is an 63 y.o. year old female who is a primary patient of Fleet Contras, MD.  The Medicaid Managed Care Coordination team was consulted for assistance with:    Chronic healthcare management needs, HTN, DM, retinopathy, depression, CHF, CKD on HD, chronic pain, gout, OSA, neuropathy, h/o CVA, CAD  Maria Hudson was given information about Medicaid Managed Care Coordination team services today. Maria Hudson Patient agreed to services and verbal consent obtained.  [name] for follow up visit in response to provider referral for case management and/or care coordination services.   Patient is participating in a Managed Medicaid Plan:  Yes  Assessments/Interventions:  Review of past medical history, allergies, medications, health status, including review of consultants reports, laboratory and other test data, was performed as part of comprehensive evaluation and provision of chronic care management services.  SDOH (Social Drivers of Health) assessments and interventions performed: SDOH Interventions    Flowsheet Row Patient Outreach Telephone from 08/23/2023 in [place] POPULATION HEALTH DEPARTMENT Patient Outreach Telephone from 08/17/2023 in [place] POPULATION HEALTH DEPARTMENT Patient Outreach Telephone from 07/19/2023 in [place] POPULATION HEALTH DEPARTMENT Patient Outreach Telephone from 06/17/2023 in [place] POPULATION HEALTH DEPARTMENT Patient Outreach Telephone from 05/19/2023 in [place] POPULATION HEALTH DEPARTMENT Patient Outreach Telephone from 04/06/2023 in [place] POPULATION HEALTH DEPARTMENT  SDOH Interventions        Housing Interventions -- -- -- Intervention Not Indicated -- --  Utilities Interventions -- -- Intervention Not Indicated -- -- --  Alcohol Usage Interventions -- -- -- Intervention Not Indicated (Score  <7) -- --  Physical Activity Interventions Intervention Not Indicated, Other (Comments)  [not able to peform strenuous exercise] -- -- -- Other (Comments)  [not able to participate in this type of exercise] --  Stress Interventions Intervention Not Indicated -- -- -- Provide Counseling, Other (Comment)  [[employer]] --  Social Connections Interventions -- Intervention Not Indicated -- -- -- Intervention Not Indicated  Health Literacy Interventions -- -- Intervention Not Indicated -- -- --     Care Plan Allergies  Allergen Reactions   Metformin And Related Nausea Only   Other     Clonidine patch causes skin irritation    Hydrocodone Itching    Patient able to tolerate when taken with Benadryl.   Oxycodone-Acetaminophen Itching    Patient able to tolerate when taken with Benadryl.   Medications Reviewed Today     Reviewed by [name], RN (Registered Nurse) on 08/23/23 at 1323  Med List Status: <None>   Medication Order Taking? Sig Documenting Provider Last Dose Status Informant  Accu-Chek Softclix Lancets lancets [id number] No 3 (three) times daily. [provider] Taking Active Self  acetaminophen (TYLENOL) 500 MG tablet [id number] No Take 1,000 mg by mouth every 6 (six) hours as needed for moderate pain or headache. [provider] Past Week Active Self  allopurinol (ZYLOPRIM) 100 MG tablet [id number] No Take 100 mg by mouth in the morning. [provider] 08/11/2023 Active Self  amLODipine (NORVASC) 10 MG tablet [id number] No Take 10 mg by mouth daily. [provider] 08/11/2023 Active Self  aspirin EC 81 MG tablet [id number] No Take 81 mg by mouth in the morning. Swallow whole. [provider] 08/10/2023 Active Self           Med Note ([name], [name]  Mon Apr 14, 2021  9:22 PM)    atorvastatin (LIPITOR) 40 MG tablet [id number] No Take 1 tablet (40 mg total) by mouth every evening. Chilton Si, MD 08/10/2023 Active Self  buPROPion  [facility]) 100 MG tablet [id number] No Take 100 mg by mouth 2 (two) times daily. [provider] Past Week Active Self           Med Note [name]   Tue Aug 10, 2023 12:11 PM) Ran out  carvedilol (COREG) 25 MG tablet [id number] No Take 1 tablet (25 mg total) by mouth 2 (two) times daily with a meal. [name], York Ram, MD 08/11/2023  9:00 AM Active Self  cholecalciferol (VITAMIN D3) 25 MCG (1000 UNIT) tablet [id number] No Take 2,000 Units by mouth daily after lunch. [provider] 08/10/2023 Active Self  cloNIDine (CATAPRES) 0.2 MG tablet [id number] No TAKE 2 TABLETS (0.4 MG TOTAL) BY MOUTH 3 (THREE) TIMES DAILY.  Patient taking differently: Take 0.2 mg by mouth 3 (three) times daily.   Prince Rome [place], [place] 08/11/2023  9:00 AM Active Self  Continuous Blood Gluc Transmit (DEXCOM G6 TRANSMITTER) MISC [id number] No Use one every 3 month [provider] 08/09/2023 Active Self  dapagliflozin propanediol (FARXIGA) 10 MG TABS tablet [id number] No TAKE 1 TABLET BY MOUTH DAILY BEFORE BREAKFAST. [name], [place] 08/10/2023 Active Self  ferrous sulfate 325 (65 FE) MG tablet [id number] No Take 325 mg by mouth 2 (two) times daily. [provider] 08/10/2023 Active Self  fluticasone (FLONASE) 50 MCG/ACT nasal spray [id number] No Place 1 spray into both nostrils daily as needed for allergies or rhinitis. [provider] More than a month Active Self  hydrALAZINE (APRESOLINE) 100 MG tablet [id number] No TAKE 1 TABLET BY MOUTH 3 TIMES DAILY.  Patient taking differently: Take 50 mg by mouth 3 (three) times daily.   Chilton Si, MD 08/11/2023  9:00 AM Active Self  insulin glargine (LANTUS) 100 unit/mL SOPN [id number] No Inject 0-10 Units into the skin at bedtime as needed (if blood gluose is above 130 take 2 units 150 and above 10 units). [provider] Past Week Active Self           Med Note [name], [name]   Tue Oct 13, 2022   9:23 AM)    isosorbide mononitrate (IMDUR) 120 MG 24 hr tablet [id number] No Take 120 mg by mouth at bedtime. [provider] 08/11/2023  9:00 AM Active Self  LINZESS 145 MCG CAPS capsule [id number] No TAKE 1 CAPSULE BY MOUTH DAILY BEFORE BREAKFAST. Leta Baptist, PA-C 08/11/2023  9:00 AM Active Self  loratadine (CLARITIN) 10 MG tablet [id number] No Take 10 mg by mouth in the morning. [provider] 08/11/2023  9:00 AM Active Self  metolazone (ZAROXOLYN) 5 MG tablet [id number] No Take 5 mg by mouth once a week. [provider] Past Week Active   Multiple Vitamin (MULTIVITAMIN ADULT) TABS [id number] No Take 1 tablet by mouth daily with lunch. [provider] 08/10/2023 Active Self  nitroGLYCERIN (NITROSTAT) 0.4 MG SL tablet [id number] No Place 1 tablet (0.4 mg total) under the tongue every 5 (five) minutes as needed for chest pain. [name], York Ram, MD More than a month Active Self           Med Note Suezanne Cheshire   Tue Aug 19, 2021  9:35 AM)    NOVOLOG FLEXPEN 100 UNIT/ML FlexPen [id number] No Inject 5 Units into the skin 3 (three) times daily with  meals. [provider] 08/10/2023 Active Self           Med Note [name], [name]   Tue Oct 13, 2022  9:23 AM)    Omega-3 Fatty Acids (FISH OIL) 1200 MG CAPS [id number] No Take 1,200 mg by mouth daily before lunch. [provider] 08/10/2023 Active Self  prednisoLONE acetate (PRED FORTE) 1 % ophthalmic suspension [id number] No Place 1 drop into the left eye 2 (two) times daily. [provider] Past Week Active Self  pregabalin (LYRICA) 25 MG capsule [id number] No Take 75 mg by mouth 2 (two) times daily. [provider] 08/11/2023  9:00 AM Active Self  Semaglutide, 2 MG/DOSE, (OZEMPIC, 2 MG/DOSE,) 8 MG/3ML SOPN [id number] No Inject 2 mg into the skin once a week. Cheree Ditto, [facility] 07/20/2023 Active Self  sertraline (ZOLOFT) 50 MG tablet [id number] No Take 50 mg by mouth  in the morning. [provider]  9:00 AM Active Self  sodium phosphate (FLEET) 7-19 GM/118ML enema 1 enema [id number]   Sherrilyn Rist, MD  Active   spironolactone (ALDACTONE) 50 MG tablet [id number] No Take 50 mg by mouth daily. [provider] 08/11/2023  9:00 AM Active Self  tiZANidine (ZANAFLEX) 4 MG tablet [id number] No Take 4 mg by mouth every 6 (six) hours as needed for muscle spasms. [provider] Past Week Active Self  torsemide (DEMADEX) 20 MG tablet [id number] No Take 1 tablet (20 mg total) by mouth daily as needed.  Patient taking differently: Take 40 mg by mouth in the morning.   Prince Rome [place], [place] 08/09/2023 Active Self  traMADol (ULTRAM) 50 MG tablet [id number]  Take 1 tablet (50 mg total) by mouth every 12 (twelve) hours as needed. Dara Lords, PA-C  Active   traZODone (DESYREL) 50 MG tablet [id number] No Take 50 mg by mouth at bedtime. [provider] Past Week Active   TURMERIC CURCUMIN PO [id number] No Take 1,200 mg by mouth daily at 6 (six) AM. 600 mg each Dorna Bloom [provider] 08/10/2023 Active Self           Patient Active Problem List   Diagnosis Date Noted   History of colonic polyps 12/28/2022   Chronic constipation 03/31/2022   Pain due to onychomycosis of toenails of both feet 12/05/2021   Pure hypercholesterolemia 05/27/2021   Chronic diastolic CHF (congestive heart failure) (HCC) 04/29/2021   Hypertensive urgency 04/14/2021   Acute on chronic diastolic CHF (congestive heart failure) (HCC) 04/14/2021   Chest pain 03/05/2021   CAD (coronary artery disease) 03/05/2021   T2DM (type 2 diabetes mellitus) (HCC) 03/05/2021   CKD (chronic kidney disease) stage 4, GFR 15-29 ml/min (HCC) 03/05/2021   Class 3 obesity 03/05/2021   CVA (cerebral vascular accident) (HCC) 03/05/2021   Acute on chronic diastolic heart failure (HCC) 03/05/2021   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   AKI (acute kidney injury) (HCC)  03/05/2021   Vitreous hemorrhage of left eye (HCC) 11/14/2020   Orthopnea 08/06/2020   Vision loss of right eye 08/06/2020   Excessive daytime sleepiness 01/11/2020   Iron deficiency anemia 12/14/2019   Vitreous hemorrhage, right (HCC) 12/04/2019   Diabetic neuropathy (HCC) 07/26/2019   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) 02/03/2019   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   Conductive hearing loss, bilateral 03/03/2018   Chronic bilateral low back pain without sciatica 08/21/2015   Chronic kidney disease (CKD) stage G3b/A3, moderately decreased  glomerular filtration rate (GFR) between 30-44 mL/min/1.73 square meter and albuminuria creatinine ratio greater than 300 mg/g (HCC) 04/30/2015   Type 2 diabetes mellitus with diabetic polyneuropathy, with long-term current use of insulin (HCC) 02/15/2015   Advance directive on file 05/17/2014   Refusal of blood transfusions as patient is Jehovah's Witness 05/17/2014   OSA treated with BiPAP 02/23/2014   Coronary artery disease 07/07/2013   Severe obesity (BMI >= 40) (HCC) 05/18/2013   Neuropathy 04/11/2013   Diastolic heart failure (HCC) 10/16/2012   Essential hypertension 10/16/2012   Conditions to be addressed/monitored per PCP order:  Chronic healthcare management needs, HTN, DM, retinopathy, depression, CHF, CKD on HD, chronic pain, gout, OSA, neuropathy, h/o CVA, CAD  Care Plan : RN Care Manager Plan Of Care  Updates made by [name], RN since 08/23/2023 12:00 AM     Problem: Knowledge Deficit and Care Coordination Needs Related to Management of HTN, HF, Type 2 DM   Priority: High     Long-Range Goal: Development of Plan of Care to Address Knowledge Deficits and Care Coordination Needs related to  management of CHF, CAD, HTN, HLD, DMII, CKD Stage 4, and Depression   Start Date: 05/06/2021  Expected End Date: 11/23/2023  Priority: High  Note:   Current  Barriers:  Knowledge Deficits related to plan of care for management of CHF, CAD, HTN, HLD, DMII, CKD Stage 4, and Depression 08/23/23:  patient has started HD.  Hoping to be able to convert to home dialysis.  ? Bleeding from fistula site-to f/u with provider.  No blurry/double vision-seen by eye provider.  Blood sugar 116.  Patient to follow up with [facility] for any CM needs.  RNCM Clinical Goal(s):  Patient will demonstrate ongoing adherence to prescribed treatment plan for CHF, CAD, HTN, HLD, DMII, CKD Stage 4, and Depression as evidenced by patient report of improved health and quality of life and no unplanned ED or unplanned hospital admissions continue to work with RN Care Manager and/or Social Worker to address care management and care coordination needs related to CHF, CAD, HTN, HLD, DMII, CKD Stage 4, and Depression as evidenced by adherence to CM Team Scheduled appointments      Work with [facility] LCSW and psychiatrist to treat depression  Interventions: Inter-disciplinary care team collaboration (see longitudinal plan of care) Evaluation of current treatment plan related to  self management and patient's adherence to plan as established by provider Mailed summary of benefits for her [facility] Managed Medicaid plan to her home address Collaborated with Care Guide for dental resources-completed. Care Guide referral for dental resources Collaborated with BSW for assistance with [facility] [place] appt.-completed. BSW referral for appt assistance. Collaborated with Pharmacy regarding  uncontrolled HTN Pharmacy referral for uncontrolled HTN-completed. Collaborated with LCSW for depression LCSW referral for depression-completed BSW referral for dental and PCP resources-completed. Collaborated with BSW  CAD Interventions: (Status:  New goal.) Long Term Goal Assessed understanding of CAD diagnosis Medications reviewed including medications utilized in CAD treatment plan Provided education on  importance of blood pressure control in management of CAD Counseled on importance of regular laboratory monitoring as prescribed Reviewed Importance of taking all medications as prescribed Reviewed Importance of attending all scheduled provider appointments Advised to report any changes in symptoms or exercise tolerance Assessed social determinant of health barriers  Hyperlipidemia Interventions:  (Status:  New goal.) Long Term Goal Medication review performed; medication list updated in electronic medical record.  Provider established cholesterol goals reviewed Counseled on importance of  regular laboratory monitoring as prescribed Reviewed importance of limiting foods high in cholesterol Assessed social determinant of health barriers     Heart Failure Interventions:  (Status: Goal on Track (progressing): YES.)  Long Term Goal - patient states her fluid retention has significantly improved,  says she cannot tolerate the compression stockings because they are too tight and make her legs hurt Assessed patient's heart failure management strategies  Diabetes:  (Status: Goal on Track (progressing): YES.) Long Term Goal   Lab Results  Component Value Date   HGBA1C 6.7 (H) 04/29/2021   Hgn A1C=6.5 on 05/31/23 Hgb A1C=5.2 in October per patient HGBA1C=6.2 on 02/22/23  Assessed patient's understanding of A1C goal: <7% Reviewed medications with patient and discussed importance of medication adherence;        Counseled on importance of regular laboratory monitoring as prescribed;        Discussed plans with patient for ongoing care management follow up and provided patient with direct contact information for care management team;      Advised patient, providing education and rationale, to check cbg three times daily and record        call provider for findings outside established parameters;       Review of patient status, including review of consultants reports, relevant laboratory and other test  results, and medications completed;       Discussed addition of Ozempic to medication regimen , reviewed mechanism of action and proven cardiovascular benefits To schedule ENDO appt-discussed nutrition referral, [facility]-END[facility] appt scheduled-patient declines referral.  Hypertension and CKD stage 4 : (Status: Goal on Track (progressing): YES.) Long Term Goal-  has been placed on kidney transplant waiting list, fistu;a placed 12/09/21,ready for use 03/11/22.  BP Readings from Last 3 Encounters:        07/19/23 139/76  08/23/23         124/69 06/17/23         130/70    Most recent eGFR/CrCl:    No components found for: CRCL Lab Results  Component Value Date   NA 136 08/19/2021   K 3.9 08/19/2021   CREATININE 2.51 (H) 08/19/2021   EGFR 19 (L) 03/13/2021   GFRNONAA 22 (L) 08/19/2021   GLUCOSE 122 (H) 08/19/2021    Evaluation of current treatment plan related to hypertension self management and patient's adherence to plan as established by provider;   Reviewed medications with patient and discussed importance of compliance;  Discussed plans with patient for ongoing care management follow up and provided patient with direct contact information for care management team; Advised patient, providing education and rationale, to monitor blood pressure daily and record, calling PCP for findings outside established parameters;  Reviewed scheduled/upcoming provider appointments   Weight Loss Interventions:  (Status:  New goal.) Long Term Goal--patient has started Ozempic Discussed indications, mechanism of action, common side effects and CV benefits  of GLP1 Ozempic Assessed tolerance of Ozempic  Interdisciplinary Collaboration:  (Status: Goal on Track (progressing): YES.) Long Term Goal  Collaborated with BSW to initiate plan of care to address needs related to Housing barriers, Inability to perform ADL's independently, and Inability to perform IADL's independently in  patient with CHF, CAD, HTN, HLD, DM, CKD Stage 4, and Depression Assessed status of patient's involvement with behavioral health counselor Collaboration with pharmacist for medication review, discussion of ? Medication side effects causing chronic constipation and arranging for patient to received home delivery of medications in blister packs  Patient Goals/Self-Care Activities:  Take medications as prescribed   Attend all scheduled provider appointments Call pharmacy for medication refills 3-7 days in advance of running out of medications Attend church or other social activities Perform all self care activities independently  Call provider office for new concerns or questions  Work with the social worker to address care coordination needs and will continue to work with the clinical team to address health care and disease management related needs call office if I gain more than 2 pounds in one day or 5 pounds in one week keep legs up while sitting track weight in diary use salt in moderation watch for swelling in feet, ankles and legs every day weigh myself daily know when to call the doctorfor fluid weight gain or consistently abnormal BP readings at home track symptoms and what helps feel better or worse check blood sugar at prescribed times: three times daily enter blood sugar readings and medication or insulin into daily log take the blood sugar log to all doctor visits take the blood sugar meter to all doctor visits eat fish at least once per week fill half of plate with vegetables manage portion size check blood pressure daily write blood pressure results in a log or diary learn about high blood pressure take blood pressure log to all doctor appointments call doctor for signs and symptoms of high blood pressure keep all doctor appointments take medications for blood pressure exactly as prescribed Limit fluid intake to 1 liter per day Collaborate with managed Medicaid team of  BSW and pharmacist to address identified needs Work with cardiology pharmacist to assist with BP stabilization Work with [facility] LCSW and psychiatrist to treat depression Review summary of [employer] benefits mailed to home address and call to enroll in program that will benefit your chronic health issues Continue to use [facility] Care Management calendar to record weight , BP and CBGs so readings are all in one place   Long-Range Goal: Establish Plan of Care for Chronic Disease Management Needs   Priority: High  Note:   Timeframe:  Long-Range Goal Priority:  High Start Date:     11/12/21                        Expected End Date: 08/23/23                - schedule appointment for vaccines needed due to my age or health - schedule recommended health tests (blood work, mammogram, colonoscopy, pap test) - schedule and keep appointment for annual check-up    Why is this important?   Screening tests can find diseases early when they are easier to treat.  Your doctor or nurse will talk with you about which tests are important for you.  Getting shots for common diseases like the flu and shingles will help prevent them.  08/23/23: Has Started Dialysis T/Th/Sat.     Follow Up:  Patient agrees to Care Plan.  Patient to f/u with [facility] Managed Medicaid for CM services.  Plan: Patient to f/u with [facility] Managed Medicaid for CM services.

## 2023-08-23 NOTE — Patient Instructions (Signed)
 Visit Information  Maria Hudson was given information about Medicaid Managed Care team care coordination services as a part of their [facility] Medicaid benefit. Maria Hudson verbally consented to engagement with the [facility] Managed Care team.   If you are experiencing a medical emergency, please call 911 or report to your local emergency department or urgent care.   If you have a non-emergency medical problem during routine business hours, please contact your provider's office and ask to speak with a nurse.   For questions related to your [facility] health plan, please call: [phone] or go here:[email or web address]  If you would like to schedule transportation through your [facility] plan, please call the following number at least 2 days in advance of your appointment: [phone].   You can also use the MTM portal or MTM mobile app to manage your rides. Reimbursement for transportation is available through [facility]! For the portal, please go to [email or web address]  Call the [facility] Crisis Line at [phone], at any time, 24 hours a day, 7 days a week. If you are in danger or need immediate medical attention call 911.  If you would like help to quit smoking, call 1-800-QUIT-NOW ((706)653-6409) OR Espaol: 1-855-Djelo-Ya (4-132-440-1027) o para ms informacin haga clic aqu or Text READY to 253-664 to register via text  Ms. Kerkman - following are the goals we discussed in your visit today:   Goals Addressed    Priority:  High Start Date:     11/12/21                        Expected End Date:  ongoing                     Follow Up Date: 08/17/23   - schedule appointment for vaccines needed due to my age or health - schedule recommended health tests (blood work, mammogram, colonoscopy, pap test) - schedule and keep appointment for annual check-up    Why is this important?   Screening tests can find diseases early when they are easier to treat.  Your  doctor or nurse will talk with you about which tests are important for you.  Getting shots for common diseases like the flu and shingles will help prevent them.  08/23/23: Has Started Dialysis T/Th/Sat.     Patient verbalizes understanding of instructions and care plan provided today and agrees to view in MyChart. Active MyChart status and patient understanding of how to access instructions and care plan via MyChart confirmed with patient.     The Managed Medicaid care management team will reach out to the patient again over the next 30 business  days.  The  Patient   has been provided with contact information for the Managed Medicaid care management team and has been advised to call with any health related questions or concerns.   Kathi Der RNC, MNN, BSN [facility] RN Care Manager Direct Dial [phone] Website: [email or web address]   Following is a copy of your plan of care:  Care Plan : RN Care Manager Plan Of Care  Updates made by [name], RN since 08/23/2023 12:00 AM     Problem: Knowledge Deficit and Care Coordination Needs Related to Management of HTN, HF, Type 2 DM   Priority: High     Long-Range Goal: Development of Plan of Care to Address Knowledge Deficits and Care Coordination Needs related to  management of CHF, CAD, HTN, HLD, DMII,  CKD Stage 4, and Depression   Start Date: 05/06/2021  Expected End Date: 11/23/2023  Priority: High  Note:   Current Barriers:  Knowledge Deficits related to plan of care for management of CHF, CAD, HTN, HLD, DMII, CKD Stage 4, and Depression 08/23/23:  patient has started HD.  Hoping to be able to convert to home dialysis.  ? Bleeding from fistula site-to f/u with provider.  No blurry/double vision-seen by eye provider.  Blood sugar 116.  RNCM Clinical Goal(s):  Patient will demonstrate ongoing adherence to prescribed treatment plan for CHF, CAD, HTN, HLD, DMII, CKD Stage 4, and  Depression as evidenced by patient report of improved health and quality of life and no unplanned ED or unplanned hospital admissions continue to work with RN Care Manager and/or Social Worker to address care management and care coordination needs related to CHF, CAD, HTN, HLD, DMII, CKD Stage 4, and Depression as evidenced by adherence to CM Team Scheduled appointments      Work with [facility] LCSW and psychiatrist to treat depression  Interventions: Inter-disciplinary care team collaboration (see longitudinal plan of care) Evaluation of current treatment plan related to  self management and patient's adherence to plan as established by provider Mailed summary of benefits for her [facility] Managed Medicaid plan to her home address Collaborated with Care Guide for dental resources-completed. Care Guide referral for dental resources Collaborated with BSW for assistance with [facility] appt.-completed. BSW referral for appt assistance. Collaborated with Pharmacy regarding  uncontrolled HTN Pharmacy referral for uncontrolled HTN-completed. Collaborated with LCSW for depression LCSW referral for depression-completed BSW referral for dental and PCP resources-completed. Collaborated with BSW 08/17/23: RNCM called patient at scheduled time. Patient answered phone and stated she was at dialysis speaking with SW. Patient asked to be called back another time. RNCM rescheduled appointment at patient request.   CAD Interventions: (Status:  New goal.) Long Term Goal Assessed understanding of CAD diagnosis Medications reviewed including medications utilized in CAD treatment plan Provided education on importance of blood pressure control in management of CAD Counseled on importance of regular laboratory monitoring as prescribed Reviewed Importance of taking all medications as prescribed Reviewed Importance of attending all scheduled provider appointments Advised to report any changes in symptoms or exercise  tolerance Assessed social determinant of health barriers  Hyperlipidemia Interventions:  (Status:  New goal.) Long Term Goal Medication review performed; medication list updated in electronic medical record.  Provider established cholesterol goals reviewed Counseled on importance of regular laboratory monitoring as prescribed Reviewed importance of limiting foods high in cholesterol Assessed social determinant of health barriers     Heart Failure Interventions:  (Status: Goal on Track (progressing): YES.)  Long Term Goal - patient states her fluid retention has significantly improved,  says she cannot tolerate the compression stockings because they are too tight and make her legs hurt Assessed patient's heart failure management strategies  Diabetes:  (Status: Goal on Track (progressing): YES.) Long Term Goal   Lab Results  Component Value Date   HGBA1C 6.7 (H) 04/29/2021   Hgn A1C=6.5 on 05/31/23 Hgb A1C=5.2 in October per patient HGBA1C=6.2 on 02/22/23  Assessed patient's understanding of A1C goal: <7% Reviewed medications with patient and discussed importance of medication adherence;        Counseled on importance of regular laboratory monitoring as prescribed;        Discussed plans with patient for ongoing care management follow up and provided patient with direct contact information for care management team;  Advised patient, providing education and rationale, to check cbg three times daily and record        call provider for findings outside established parameters;       Review of patient status, including review of consultants reports, relevant laboratory and other test results, and medications completed;       Discussed addition of Ozempic to medication regimen , reviewed mechanism of action and proven cardiovascular benefits To schedule ENDO appt-discussed nutrition referral, [facility]-ENDO appt scheduled-patient declines referral.  Hypertension  and CKD stage 4 : (Status: Goal on Track (progressing): YES.) Long Term Goal-  has been placed on kidney transplant waiting list, fistu;a placed 12/09/21,ready for use 03/11/22.  BP Readings from Last 3 Encounters:        07/19/23 139/76  08/23/23         124/69 06/17/23         130/70    Most recent eGFR/CrCl:    No components found for: CRCL Lab Results  Component Value Date   NA 136 08/19/2021   K 3.9 08/19/2021   CREATININE 2.51 (H) 08/19/2021   EGFR 19 (L) 03/13/2021   GFRNONAA 22 (L) 08/19/2021   GLUCOSE 122 (H) 08/19/2021    Evaluation of current treatment plan related to hypertension self management and patient's adherence to plan as established by provider;   Reviewed medications with patient and discussed importance of compliance;  Discussed plans with patient for ongoing care management follow up and provided patient with direct contact information for care management team; Advised patient, providing education and rationale, to monitor blood pressure daily and record, calling PCP for findings outside established parameters;  Reviewed scheduled/upcoming provider appointments   Weight Loss Interventions:  (Status:  New goal.) Long Term Goal--patient has started Ozempic Discussed indications, mechanism of action, common side effects and CV benefits  of GLP1 Ozempic Assessed tolerance of Ozempic  Interdisciplinary Collaboration:  (Status: Goal on Track (progressing): YES.) Long Term Goal  Collaborated with BSW to initiate plan of care to address needs related to Housing barriers, Inability to perform ADL's independently, and Inability to perform IADL's independently in patient with CHF, CAD, HTN, HLD, DM, CKD Stage 4, and Depression Assessed status of patient's involvement with behavioral health counselor Collaboration with pharmacist for medication review, discussion of ? Medication side effects causing chronic constipation and arranging for patient to received home delivery of  medications in blister packs  Patient Goals/Self-Care Activities: Take medications as prescribed   Attend all scheduled provider appointments Call pharmacy for medication refills 3-7 days in advance of running out of medications Attend church or other social activities Perform all self care activities independently  Call provider office for new concerns or questions  Work with the social worker to address care coordination needs and will continue to work with the clinical team to address health care and disease management related needs call office if I gain more than 2 pounds in one day or 5 pounds in one week keep legs up while sitting track weight in diary use salt in moderation watch for swelling in feet, ankles and legs every day weigh myself daily know when to call the doctorfor fluid weight gain or consistently abnormal BP readings at home track symptoms and what helps feel better or worse check blood sugar at prescribed times: three times daily enter blood sugar readings and medication or insulin into daily log take the blood sugar log to all doctor visits take the blood sugar meter  to all doctor visits eat fish at least once per week fill half of plate with vegetables manage portion size check blood pressure daily write blood pressure results in a log or diary learn about high blood pressure take blood pressure log to all doctor appointments call doctor for signs and symptoms of high blood pressure keep all doctor appointments take medications for blood pressure exactly as prescribed Limit fluid intake to 1 liter per day Collaborate with managed Medicaid team of BSW and pharmacist to address identified needs Work with cardiology pharmacist to assist with BP stabilization Work with [facility] LCSW and psychiatrist to treat depression Review summary of Well[employer] benefits mailed to home address and call to enroll in program that will benefit your chronic health issues Continue to  use [facility] Care Management calendar to record weight , BP and CBGs so readings are all in one place

## 2023-08-23 NOTE — Telephone Encounter (Signed)
 Triage:   -pt arrives in lobby with c/o LUA bleeding at surgical site, soaking through bandage.  The bleeding has not stopped since surgery, just constantly dripping.  She admits that it has slowed down and the swelling had decreased. -evaluated & treated by Marisue Humble, PA who reports no s/s of infection, staples intact.  Dressed with mild compression dressing.  Pt provided with extra dressings, instructed to call if questions arise or report to ED for worsening bleeding prior to follow up appt.

## 2023-08-24 DIAGNOSIS — N185 Chronic kidney disease, stage 5: Secondary | ICD-10-CM | POA: Diagnosis not present

## 2023-08-30 ENCOUNTER — Other Ambulatory Visit: Payer: Self-pay

## 2023-08-30 DIAGNOSIS — N185 Chronic kidney disease, stage 5: Secondary | ICD-10-CM

## 2023-09-07 ENCOUNTER — Ambulatory Visit (HOSPITAL_COMMUNITY)
Admission: RE | Admit: 2023-09-07 | Discharge: 2023-09-07 | Disposition: A | Discharge status: Home / Self Care | Payer: Medicaid Other | Source: Ambulatory Visit | Attending: Vascular Surgery | Admitting: Vascular Surgery

## 2023-09-07 DIAGNOSIS — N185 Chronic kidney disease, stage 5: Secondary | ICD-10-CM | POA: Diagnosis not present

## 2023-09-10 DIAGNOSIS — G4733 Obstructive sleep apnea (adult) (pediatric): Secondary | ICD-10-CM | POA: Diagnosis not present

## 2023-09-13 DIAGNOSIS — E1122 Type 2 diabetes mellitus with diabetic chronic kidney disease: Secondary | ICD-10-CM | POA: Diagnosis not present

## 2023-09-13 DIAGNOSIS — N186 End stage renal disease: Secondary | ICD-10-CM | POA: Diagnosis not present

## 2023-09-13 DIAGNOSIS — Z992 Dependence on renal dialysis: Secondary | ICD-10-CM | POA: Diagnosis not present

## 2023-09-16 ENCOUNTER — Other Ambulatory Visit: Payer: Self-pay | Admitting: Physician Assistant

## 2023-09-17 ENCOUNTER — Ambulatory Visit (INDEPENDENT_AMBULATORY_CARE_PROVIDER_SITE_OTHER): Admitting: Physician Assistant

## 2023-09-17 VITALS — BP 124/71 | HR 72 | Temp 97.6°F | Ht 63.0 in | Wt 215.1 lb

## 2023-09-17 DIAGNOSIS — N186 End stage renal disease: Secondary | ICD-10-CM

## 2023-09-17 DIAGNOSIS — Z992 Dependence on renal dialysis: Secondary | ICD-10-CM

## 2023-09-17 NOTE — Telephone Encounter (Signed)
 Pt will need to contact office. We do not refill pain meds

## 2023-09-19 NOTE — Progress Notes (Signed)
 POST OPERATIVE OFFICE NOTE    CC:  F/u for surgery  HPI:  Maria Hudson is a 63 y.o. female who is here for postop check.  She recently underwent revision of left upper arm AV fistula with superficialization and ligation of several branches on 08/11/2023 by Dr. Lenell Antu.  This was done for a fairly tortuous left upper arm fistula.   She returns today for follow-up.  She has been doing well since her surgery.  She has tried to keep her incisions clean and dry.  She denies any dehiscence of her incisions or drainage.  She denies any symptoms of steal in the left upper extremity.   Allergies  Allergen Reactions   Metformin And Related Nausea Only   Other     Clonidine patch causes skin irritation    Hydrocodone Itching    Patient able to tolerate when taken with Benadryl.   Oxycodone-Acetaminophen Itching    Patient able to tolerate when taken with Benadryl.    Current Outpatient Medications  Medication Sig Dispense Refill   Accu-Chek Softclix Lancets lancets 3 (three) times daily.     acetaminophen (TYLENOL) 500 MG tablet Take 1,000 mg by mouth every 6 (six) hours as needed for moderate pain or headache.     allopurinol (ZYLOPRIM) 100 MG tablet Take 100 mg by mouth in the morning.     amLODipine (NORVASC) 10 MG tablet Take 10 mg by mouth daily.     aspirin EC 81 MG tablet Take 81 mg by mouth in the morning. Swallow whole.     atorvastatin (LIPITOR) 40 MG tablet Take 1 tablet (40 mg total) by mouth every evening. 90 tablet 3   carvedilol (COREG) 25 MG tablet Take 1 tablet (25 mg total) by mouth 2 (two) times daily with a meal. 60 tablet 0   cholecalciferol (VITAMIN D3) 25 MCG (1000 UNIT) tablet Take 2,000 Units by mouth daily after lunch.     Continuous Blood Gluc Transmit (DEXCOM G6 TRANSMITTER) MISC Use one every 3 month     dapagliflozin propanediol (FARXIGA) 10 MG TABS tablet TAKE 1 TABLET BY MOUTH DAILY BEFORE BREAKFAST. 30 tablet 11   ferrous sulfate 325 (65 FE) MG tablet Take  325 mg by mouth 2 (two) times daily.     fluticasone (FLONASE) 50 MCG/ACT nasal spray Place 1 spray into both nostrils daily as needed for allergies or rhinitis.     hydrALAZINE (APRESOLINE) 100 MG tablet TAKE 1 TABLET BY MOUTH 3 TIMES DAILY. (Patient taking differently: Take 50 mg by mouth 3 (three) times daily.) 270 tablet 2   insulin glargine (LANTUS) 100 unit/mL SOPN Inject 0-10 Units into the skin at bedtime as needed (if blood gluose is above 130 take 2 units 150 and above 10 units).     isosorbide mononitrate (IMDUR) 120 MG 24 hr tablet Take 120 mg by mouth at bedtime.     LINZESS 145 MCG CAPS capsule TAKE 1 CAPSULE BY MOUTH DAILY BEFORE BREAKFAST. 30 capsule 11   loratadine (CLARITIN) 10 MG tablet Take 10 mg by mouth in the morning.     metolazone (ZAROXOLYN) 5 MG tablet Take 5 mg by mouth once a week.     Multiple Vitamin (MULTIVITAMIN ADULT) TABS Take 1 tablet by mouth daily with lunch.     nitroGLYCERIN (NITROSTAT) 0.4 MG SL tablet Place 1 tablet (0.4 mg total) under the tongue every 5 (five) minutes as needed for chest pain. 20 tablet 0   NOVOLOG FLEXPEN 100 UNIT/ML  FlexPen Inject 5 Units into the skin 3 (three) times daily with meals.     Omega-3 Fatty Acids (FISH OIL) 1200 MG CAPS Take 1,200 mg by mouth daily before lunch.     prednisoLONE acetate (PRED FORTE) 1 % ophthalmic suspension Place 1 drop into the left eye 2 (two) times daily.     pregabalin (LYRICA) 25 MG capsule Take 75 mg by mouth 2 (two) times daily.     Semaglutide, 2 MG/DOSE, (OZEMPIC, 2 MG/DOSE,) 8 MG/3ML SOPN Inject 2 mg into the skin once a week. 3 mL 6   sertraline (ZOLOFT) 50 MG tablet Take 50 mg by mouth in the morning.     spironolactone (ALDACTONE) 50 MG tablet Take 50 mg by mouth daily.     tiZANidine (ZANAFLEX) 4 MG tablet Take 4 mg by mouth every 6 (six) hours as needed for muscle spasms.     torsemide (DEMADEX) 20 MG tablet Take 1 tablet (20 mg total) by mouth daily as needed. (Patient taking differently:  Take 40 mg by mouth in the morning.) 90 tablet 3   traMADol (ULTRAM) 50 MG tablet Take 1 tablet (50 mg total) by mouth every 12 (twelve) hours as needed. 6 tablet 0   traZODone (DESYREL) 50 MG tablet Take 50 mg by mouth at bedtime.     TURMERIC CURCUMIN PO Take 1,200 mg by mouth daily at 6 (six) AM. 600 mg each Chew     buPROPion (WELLBUTRIN) 100 MG tablet Take 100 mg by mouth 2 (two) times daily. (Patient not taking: Reported on 09/17/2023)     cloNIDine (CATAPRES) 0.2 MG tablet TAKE 2 TABLETS (0.4 MG TOTAL) BY MOUTH 3 (THREE) TIMES DAILY. (Patient not taking: Reported on 09/17/2023) 504 tablet 3   Current Facility-Administered Medications  Medication Dose Route Frequency Provider Last Rate Last Admin   sodium phosphate (FLEET) 7-19 GM/118ML enema 1 enema  1 enema Rectal Once Danis, Andreas Blower, MD       Facility-Administered Medications Ordered in Other Visits  Medication Dose Route Frequency Provider Last Rate Last Admin   technetium pyrophosphate Tc 43m injection 21.2 millicurie  21.2 millicurie Intravenous Once Hilty, Lisette Abu, MD         ROS:  See HPI  Physical Exam:   Incision: Left upper extremity incisions well-healed without signs of infection, all staples removed Extremities: Left upper extremity fistula with great thrill on palpation, minimal pulsatility.  Palpable left radial pulse Neuro: Intact motor and sensation of left upper extremity  Dialysis Duplex: 09/07/2023  -+--------+  Native artery inflow   298                               +--------------------+----------+-----------------+--------+  AVF Anastomosis        435          2126                 +--------------------+----------+-----------------+--------+     +------------+----------+------------+----------+--------------------------  ----+  OUTFLOW VEINPSV (cm/s)  Diameter  Depth (cm)           Describe                                        (cm)                                                +------------+----------+------------+----------+--------------------------  ----+  Prox UA         79        0.89       1.29                                    +------------+----------+------------+----------+--------------------------  ----+  Mid UA         157        0.90       0.27                                    +------------+----------+------------+----------+--------------------------  ----+  Dist UA        589        0.48       0.27       stenotic and change  in                                                             Diameter              +------------+----------+------------+----------+--------------------------  ----+  AC Fossa       163        1.21       0.14                                    +------------+----------+------------+----------+-----------------   Assessment/Plan:  This is a 63 y.o. female who is here for a post op check   -The patient recently underwent superficialization and branch ligation of a left upper extremity fistula -Her left upper arm incisions are well-healed without signs of infection.  All staples were removed at today's visit -Duplex demonstrates a well matured fistula with great flow volumes.  The fistula is close to the surface of the skin and has a great size -On exam she has a palpable left radial pulse.  Her left upper extremity fistula also has a great thrill on palpation. -I believe her fistula is ready for use at dialysis.  If it can be used successfully for at least 2-3 dialysis sessions, her [facility] can be removed -She can follow-up with our office as needed   Loel Dubonnet, PA-C [facility] [phone]   Clinic MD:  Hetty Blend

## 2023-09-25 DIAGNOSIS — Z419 Encounter for procedure for purposes other than remedying health state, unspecified: Secondary | ICD-10-CM | POA: Diagnosis not present

## 2023-10-11 ENCOUNTER — Emergency Department (HOSPITAL_COMMUNITY)

## 2023-10-11 ENCOUNTER — Other Ambulatory Visit: Payer: Self-pay

## 2023-10-11 ENCOUNTER — Inpatient Hospital Stay (HOSPITAL_COMMUNITY)
Admission: EM | Admit: 2023-10-11 | Discharge: 2023-10-15 | DRG: 314 | Disposition: A | Discharge status: Home / Self Care | Attending: Internal Medicine | Admitting: Internal Medicine

## 2023-10-11 ENCOUNTER — Encounter (HOSPITAL_COMMUNITY): Payer: Self-pay | Admitting: Emergency Medicine

## 2023-10-11 DIAGNOSIS — E876 Hypokalemia: Secondary | ICD-10-CM | POA: Diagnosis present

## 2023-10-11 DIAGNOSIS — Z8673 Personal history of transient ischemic attack (TIA), and cerebral infarction without residual deficits: Secondary | ICD-10-CM

## 2023-10-11 DIAGNOSIS — Z794 Long term (current) use of insulin: Secondary | ICD-10-CM

## 2023-10-11 DIAGNOSIS — F32A Depression, unspecified: Secondary | ICD-10-CM | POA: Diagnosis present

## 2023-10-11 DIAGNOSIS — Z6841 Body Mass Index (BMI) 40.0 and over, adult: Secondary | ICD-10-CM

## 2023-10-11 DIAGNOSIS — D631 Anemia in chronic kidney disease: Secondary | ICD-10-CM | POA: Diagnosis present

## 2023-10-11 DIAGNOSIS — I1A0 Resistant hypertension: Secondary | ICD-10-CM | POA: Diagnosis present

## 2023-10-11 DIAGNOSIS — R0902 Hypoxemia: Secondary | ICD-10-CM | POA: Diagnosis not present

## 2023-10-11 DIAGNOSIS — Z7982 Long term (current) use of aspirin: Secondary | ICD-10-CM

## 2023-10-11 DIAGNOSIS — Z90710 Acquired absence of both cervix and uterus: Secondary | ICD-10-CM

## 2023-10-11 DIAGNOSIS — Y848 Other medical procedures as the cause of abnormal reaction of the patient, or of later complication, without mention of misadventure at the time of the procedure: Secondary | ICD-10-CM | POA: Diagnosis present

## 2023-10-11 DIAGNOSIS — I132 Hypertensive heart and chronic kidney disease with heart failure and with stage 5 chronic kidney disease, or end stage renal disease: Secondary | ICD-10-CM | POA: Diagnosis present

## 2023-10-11 DIAGNOSIS — G9341 Metabolic encephalopathy: Secondary | ICD-10-CM | POA: Diagnosis present

## 2023-10-11 DIAGNOSIS — G4733 Obstructive sleep apnea (adult) (pediatric): Secondary | ICD-10-CM | POA: Diagnosis present

## 2023-10-11 DIAGNOSIS — I9589 Other hypotension: Secondary | ICD-10-CM | POA: Diagnosis not present

## 2023-10-11 DIAGNOSIS — Z79899 Other long term (current) drug therapy: Secondary | ICD-10-CM

## 2023-10-11 DIAGNOSIS — J9601 Acute respiratory failure with hypoxia: Secondary | ICD-10-CM

## 2023-10-11 DIAGNOSIS — I10 Essential (primary) hypertension: Secondary | ICD-10-CM | POA: Diagnosis not present

## 2023-10-11 DIAGNOSIS — Z951 Presence of aortocoronary bypass graft: Secondary | ICD-10-CM

## 2023-10-11 DIAGNOSIS — I700 Atherosclerosis of aorta: Secondary | ICD-10-CM | POA: Diagnosis not present

## 2023-10-11 DIAGNOSIS — R4182 Altered mental status, unspecified: Secondary | ICD-10-CM | POA: Diagnosis not present

## 2023-10-11 DIAGNOSIS — Z8701 Personal history of pneumonia (recurrent): Secondary | ICD-10-CM

## 2023-10-11 DIAGNOSIS — R509 Fever, unspecified: Principal | ICD-10-CM | POA: Diagnosis present

## 2023-10-11 DIAGNOSIS — E1122 Type 2 diabetes mellitus with diabetic chronic kidney disease: Secondary | ICD-10-CM | POA: Diagnosis present

## 2023-10-11 DIAGNOSIS — E1142 Type 2 diabetes mellitus with diabetic polyneuropathy: Secondary | ICD-10-CM

## 2023-10-11 DIAGNOSIS — R1084 Generalized abdominal pain: Secondary | ICD-10-CM | POA: Diagnosis not present

## 2023-10-11 DIAGNOSIS — Z8249 Family history of ischemic heart disease and other diseases of the circulatory system: Secondary | ICD-10-CM

## 2023-10-11 DIAGNOSIS — I2510 Atherosclerotic heart disease of native coronary artery without angina pectoris: Secondary | ICD-10-CM | POA: Diagnosis present

## 2023-10-11 DIAGNOSIS — E7800 Pure hypercholesterolemia, unspecified: Secondary | ICD-10-CM | POA: Diagnosis present

## 2023-10-11 DIAGNOSIS — N2581 Secondary hyperparathyroidism of renal origin: Secondary | ICD-10-CM | POA: Diagnosis present

## 2023-10-11 DIAGNOSIS — N186 End stage renal disease: Secondary | ICD-10-CM | POA: Diagnosis not present

## 2023-10-11 DIAGNOSIS — R531 Weakness: Secondary | ICD-10-CM | POA: Diagnosis not present

## 2023-10-11 DIAGNOSIS — Z823 Family history of stroke: Secondary | ICD-10-CM

## 2023-10-11 DIAGNOSIS — Z7985 Long-term (current) use of injectable non-insulin antidiabetic drugs: Secondary | ICD-10-CM

## 2023-10-11 DIAGNOSIS — A419 Sepsis, unspecified organism: Secondary | ICD-10-CM | POA: Diagnosis present

## 2023-10-11 DIAGNOSIS — I517 Cardiomegaly: Secondary | ICD-10-CM | POA: Diagnosis not present

## 2023-10-11 DIAGNOSIS — I2721 Secondary pulmonary arterial hypertension: Secondary | ICD-10-CM | POA: Diagnosis present

## 2023-10-11 DIAGNOSIS — T80211A Bloodstream infection due to central venous catheter, initial encounter: Principal | ICD-10-CM | POA: Diagnosis present

## 2023-10-11 DIAGNOSIS — K8020 Calculus of gallbladder without cholecystitis without obstruction: Secondary | ICD-10-CM | POA: Diagnosis not present

## 2023-10-11 DIAGNOSIS — I5032 Chronic diastolic (congestive) heart failure: Secondary | ICD-10-CM | POA: Diagnosis present

## 2023-10-11 DIAGNOSIS — Z992 Dependence on renal dialysis: Secondary | ICD-10-CM

## 2023-10-11 DIAGNOSIS — R079 Chest pain, unspecified: Secondary | ICD-10-CM | POA: Diagnosis not present

## 2023-10-11 DIAGNOSIS — R109 Unspecified abdominal pain: Secondary | ICD-10-CM | POA: Diagnosis not present

## 2023-10-11 DIAGNOSIS — A4159 Other Gram-negative sepsis: Secondary | ICD-10-CM | POA: Diagnosis present

## 2023-10-11 DIAGNOSIS — I959 Hypotension, unspecified: Secondary | ICD-10-CM | POA: Diagnosis not present

## 2023-10-11 HISTORY — DX: End stage renal disease: Z99.2

## 2023-10-11 HISTORY — DX: End stage renal disease: N18.6

## 2023-10-11 LAB — CBC WITH DIFFERENTIAL/PLATELET
Abs Immature Granulocytes: 0.08 10*3/uL — ABNORMAL HIGH (ref 0.00–0.07)
Basophils Absolute: 0 10*3/uL (ref 0.0–0.1)
Basophils Relative: 0 %
Eosinophils Absolute: 0 10*3/uL (ref 0.0–0.5)
Eosinophils Relative: 0 %
HCT: 34 % — ABNORMAL LOW (ref 36.0–46.0)
Hemoglobin: 10.9 g/dL — ABNORMAL LOW (ref 12.0–15.0)
Immature Granulocytes: 1 %
Lymphocytes Relative: 2 %
Lymphs Abs: 0.2 10*3/uL — ABNORMAL LOW (ref 0.7–4.0)
MCH: 27.7 pg (ref 26.0–34.0)
MCHC: 32.1 g/dL (ref 30.0–36.0)
MCV: 86.3 fL (ref 80.0–100.0)
Monocytes Absolute: 0.5 10*3/uL (ref 0.1–1.0)
Monocytes Relative: 5 %
Neutro Abs: 9 10*3/uL — ABNORMAL HIGH (ref 1.7–7.7)
Neutrophils Relative %: 92 %
Platelets: 170 10*3/uL (ref 150–400)
RBC: 3.94 MIL/uL (ref 3.87–5.11)
RDW: 15.9 % — ABNORMAL HIGH (ref 11.5–15.5)
WBC: 9.8 10*3/uL (ref 4.0–10.5)
nRBC: 0 % (ref 0.0–0.2)

## 2023-10-11 LAB — COMPREHENSIVE METABOLIC PANEL WITH GFR
ALT: 14 U/L (ref 0–44)
AST: 42 U/L — ABNORMAL HIGH (ref 15–41)
Albumin: 3.4 g/dL — ABNORMAL LOW (ref 3.5–5.0)
Alkaline Phosphatase: 37 U/L — ABNORMAL LOW (ref 38–126)
Anion gap: 18 — ABNORMAL HIGH (ref 5–15)
BUN: 77 mg/dL — ABNORMAL HIGH (ref 8–23)
CO2: 25 mmol/L (ref 22–32)
Calcium: 8.8 mg/dL — ABNORMAL LOW (ref 8.9–10.3)
Chloride: 94 mmol/L — ABNORMAL LOW (ref 98–111)
Creatinine, Ser: 7.32 mg/dL — ABNORMAL HIGH (ref 0.44–1.00)
GFR, Estimated: 6 mL/min — ABNORMAL LOW (ref >60)
Glucose, Bld: 194 mg/dL — ABNORMAL HIGH (ref 70–99)
Potassium: 3.7 mmol/L (ref 3.5–5.1)
Sodium: 137 mmol/L (ref 135–145)
Total Bilirubin: 0.6 mg/dL (ref 0.0–1.2)
Total Protein: 6.9 g/dL (ref 6.5–8.1)

## 2023-10-11 LAB — RESP PANEL BY RT-PCR (RSV, FLU A&B, COVID)  RVPGX2
Influenza A by PCR: NEGATIVE
Influenza B by PCR: NEGATIVE
Resp Syncytial Virus by PCR: NEGATIVE
SARS Coronavirus 2 by RT PCR: NEGATIVE

## 2023-10-11 LAB — I-STAT CG4 LACTIC ACID, ED: Lactic Acid, Venous: 1.2 mmol/L (ref 0.5–1.9)

## 2023-10-11 MED ORDER — ACETAMINOPHEN 500 MG PO TABS
1000.0000 mg | ORAL_TABLET | Freq: Once | ORAL | Status: AC
  Administered 2023-10-11: 1000 mg via ORAL
  Filled 2023-10-11: qty 2

## 2023-10-11 NOTE — ED Triage Notes (Signed)
 Pt BIB EMS from home with c/o n/v, abdominal pain that was been worsening since Saturday after dialysis. Hasn't missed any dialysis, goes TTHSAT.   18g RAC  650mg  tylenol  at 2051

## 2023-10-11 NOTE — ED Provider Notes (Signed)
  EMERGENCY DEPARTMENT AT [facility] Provider Note   CSN: [id number] Arrival date & time: 10/11/23  2105     History  Chief Complaint  Patient presents with   Weakness   HPI  Maria Hudson is a 63 y.o. female with past medical history of CAD, type 2 diabetes, CKD on dialysis, heart failure, hypertension presents due to weakness.  Patient states that she is having malaise and chills over the last 2 days.  She also endorses a cough throughout this time.  She denies any chest pain, but does endorse shortness of breath.  Triage note mentions nausea vomiting and abdominal pain, patient states that this happened Saturday after dialysis as well as intermittent left upper quadrant abdominal pain since.  Patient denies any dysuria or hematuria, denies any flank pain.  Daughter is at bedside, states that patient seemed a little more confused than usual today.   Weakness      Home Medications Prior to Admission medications   Medication Sig Start Date End Date Taking? Authorizing Provider  Accu-Chek Softclix Lancets lancets 3 (three) times daily. 06/26/21   [provider]  acetaminophen  (TYLENOL ) 500 MG tablet Take 1,000 mg by mouth every 6 (six) hours as needed for moderate pain or headache.    [provider]  allopurinol  (ZYLOPRIM ) 100 MG tablet Take 100 mg by mouth in the morning. 01/27/21   [provider]  amLODipine  (NORVASC ) 10 MG tablet Take 10 mg by mouth daily. 02/25/22   [provider]  aspirin  EC 81 MG tablet Take 81 mg by mouth in the morning. Swallow whole.    [provider]  atorvastatin  (LIPITOR) 40 MG tablet Take 1 tablet (40 mg total) by mouth every evening. 05/27/21   Maudine Sos, MD  buPROPion  (WELLBUTRIN ) 100 MG tablet Take 100 mg by mouth 2 (two) times daily. Patient not taking: Reported on 09/17/2023 02/04/21   [provider]  carvedilol  (COREG ) 25 MG tablet Take 1 tablet (25 mg total) by  mouth 2 (two) times daily with a meal. 03/07/21   [name], Curlee Doss, MD  cholecalciferol (VITAMIN D3) 25 MCG (1000 UNIT) tablet Take 2,000 Units by mouth daily after lunch.    [provider]  cloNIDine  (CATAPRES ) 0.2 MG tablet TAKE 2 TABLETS (0.4 MG TOTAL) BY MOUTH 3 (THREE) TIMES DAILY. Patient not taking: Reported on 09/17/2023 03/29/23   Elmarie Hacking, FNP  Continuous Blood Gluc Transmit (DEXCOM G6 TRANSMITTER) MISC Use one every 3 month 05/25/22   [provider]  dapagliflozin  propanediol (FARXIGA ) 10 MG TABS tablet TAKE 1 TABLET BY MOUTH DAILY BEFORE BREAKFAST. 04/20/23   Milford, Arlice Bene, FNP  ferrous sulfate  325 (65 FE) MG tablet Take 325 mg by mouth 2 (two) times daily. 05/31/21   [provider]  fluticasone  (FLONASE ) 50 MCG/ACT nasal spray Place 1 spray into both nostrils daily as needed for allergies or rhinitis.    [provider]  hydrALAZINE  (APRESOLINE ) 100 MG tablet TAKE 1 TABLET BY MOUTH 3 TIMES DAILY. Patient taking differently: Take 50 mg by mouth 3 (three) times daily. 05/22/22   Maudine Sos, MD  insulin  glargine (LANTUS ) 100 unit/mL SOPN Inject 0-10 Units into the skin at bedtime as needed (if blood gluose is above 130 take 2 units 150 and above 10 units).    [provider]  isosorbide  mononitrate (IMDUR ) 120 MG 24 hr tablet Take 120 mg by mouth at bedtime.    [provider]  LINZESS  145 MCG CAPS capsule TAKE 1 CAPSULE BY MOUTH DAILY BEFORE BREAKFAST. 05/12/23   Zehr, Jessica D, PA-C  loratadine  (CLARITIN ) 10 MG tablet Take 10 mg by mouth in the morning. 02/14/21   [provider]  metolazone (ZAROXOLYN) 5 MG tablet Take 5 mg by mouth once a week. 05/25/23   [provider]  Multiple Vitamin (MULTIVITAMIN ADULT) TABS Take 1 tablet by mouth daily with lunch.    [provider]  nitroGLYCERIN  (NITROSTAT ) 0.4 MG SL tablet Place 1 tablet (0.4 mg total) under the tongue every 5 (five)  minutes as needed for chest pain. 03/07/21   [name], Curlee Doss, MD  NOVOLOG  FLEXPEN 100 UNIT/ML FlexPen Inject 5 Units into the skin 3 (three) times daily with meals. 01/12/21   [provider]  Omega-3 Fatty Acids (FISH OIL) 1200 MG CAPS Take 1,200 mg by mouth daily before lunch.    [provider]  prednisoLONE acetate (PRED FORTE) 1 % ophthalmic suspension Place 1 drop into the left eye 2 (two) times daily.    [provider]  pregabalin  (LYRICA ) 25 MG capsule Take 75 mg by mouth 2 (two) times daily. 02/03/21   [provider]  Semaglutide , 2 MG/DOSE, (OZEMPIC , 2 MG/DOSE,) 8 MG/3ML SOPN Inject 2 mg into the skin once a week. 11/12/22   Pavero, Veryl Gottron, RPH  sertraline  (ZOLOFT ) 50 MG tablet Take 50 mg by mouth in the morning. 02/01/21   [provider]  spironolactone  (ALDACTONE ) 50 MG tablet Take 50 mg by mouth daily. 03/11/22   [provider]  tiZANidine (ZANAFLEX) 4 MG tablet Take 4 mg by mouth every 6 (six) hours as needed for muscle spasms.    [provider]  torsemide  (DEMADEX ) 20 MG tablet Take 1 tablet (20 mg total) by mouth daily as needed. Patient taking differently: Take 40 mg by mouth in the morning. 04/20/22   Milford, Arlice Bene, FNP  traMADol  (ULTRAM ) 50 MG tablet Take 1 tablet (50 mg total) by mouth every 12 (twelve) hours as needed. 08/11/23   Rhyne, Maryanna Smart, PA-C  traZODone  (DESYREL ) 50 MG tablet Take 50 mg by mouth at bedtime.    [provider]  TURMERIC CURCUMIN PO Take 1,200 mg by mouth daily at 6 (six) AM. 600 mg each Chew    [provider]      Allergies    Metformin and related, Other, Hydrocodone, and Oxycodone -acetaminophen     Review of Systems   Review of Systems  Neurological:  Positive for weakness.    Physical Exam Updated Vital Signs BP (!) 130/50   Pulse 88   Temp (!) 103.1 F (39.5 C) (Oral)   Resp 17   Ht 5\' 3"  (1.6 m)   Wt 97.6 kg   SpO2 91%   BMI 38.12  kg/m  Physical Exam Vitals and nursing note reviewed.  Constitutional:      General: She is not in acute distress.    Appearance: She is well-developed.  HENT:     Head: Normocephalic and atraumatic.     Right Ear: External ear normal.     Left Ear: External ear normal.     Nose: Nose normal.     Mouth/Throat:     Mouth: Mucous membranes are moist.     Pharynx: No oropharyngeal exudate or posterior oropharyngeal erythema.  Eyes:     Conjunctiva/sclera: Conjunctivae normal.  Cardiovascular:     Rate and Rhythm: Normal rate and regular rhythm.  Heart sounds: No murmur heard. Pulmonary:     Effort: Pulmonary effort is normal. No respiratory distress.     Breath sounds: Normal breath sounds. No wheezing or rales.  Abdominal:     General: There is no distension.     Palpations: Abdomen is soft.     Tenderness: There is no abdominal tenderness. There is no guarding or rebound.  Musculoskeletal:        General: No swelling.     Cervical back: Neck supple.     Right lower leg: No edema.     Left lower leg: No edema.  Skin:    General: Skin is warm and dry.     Capillary Refill: Capillary refill takes less than 2 seconds.     Findings: No erythema.  Neurological:     General: No focal deficit present.     Mental Status: She is alert.  Psychiatric:        Mood and Affect: Mood normal.     ED Results / Procedures / Treatments   Labs (all labs ordered are listed, but only abnormal results are displayed) Labs Reviewed  CBC WITH DIFFERENTIAL/PLATELET - Abnormal; Notable for the following components:      Result Value   Hemoglobin 10.9 (*)    HCT 34.0 (*)    RDW 15.9 (*)    Neutro Abs 9.0 (*)    Lymphs Abs 0.2 (*)    Abs Immature Granulocytes 0.08 (*)    All other components within normal limits  COMPREHENSIVE METABOLIC PANEL WITH GFR - Abnormal; Notable for the following components:   Chloride 94 (*)    Glucose, Bld 194 (*)    BUN 77 (*)    Creatinine, Ser 7.32 (*)     Calcium  8.8 (*)    Albumin 3.4 (*)    AST 42 (*)    Alkaline Phosphatase 37 (*)    GFR, Estimated 6 (*)    Anion gap 18 (*)    All other components within normal limits  RESP PANEL BY RT-PCR (RSV, FLU A&B, COVID)  [id number]  URINALYSIS, W/ REFLEX TO CULTURE (INFECTION SUSPECTED)  I-STAT CG4 LACTIC ACID, ED  I-STAT CG4 LACTIC ACID, ED    EKG None  Radiology DG Chest Portable 1 View Result Date: 10/11/2023 CLINICAL DATA:  Weakness and fevers. EXAM: PORTABLE CHEST 1 VIEW COMPARISON:  Portable chest 08/11/2023. FINDINGS: Right IJ dialysis catheter again terminates at the superior cavoatrial junction. There is mild cardiomegaly. Vascular prominence is again noted without overt edema. The lungs are clear of infiltrates. There is no substantial pleural effusion. The mediastinum is normally outlined. There is calcification in the transverse aorta. No new osseous abnormality. IMPRESSION: 1. No evidence of acute chest disease. 2. Mild cardiomegaly and vascular prominence without overt edema. 3. Aortic atherosclerosis. Electronically Signed   By: Denman Fischer M.D.   On: 10/11/2023 22:12    Procedures Procedures    Medications Ordered in ED Medications  acetaminophen  (TYLENOL ) tablet 1,000 mg (1,000 mg Oral Given 10/11/23 2235)    ED Course/ Medical Decision Making/ A&P                                 Medical Decision Making Amount and/or Complexity of Data Reviewed Labs: ordered. Radiology: ordered.  Risk OTC drugs.   Patient is alert, afebrile, and hemodynamically stable in no acute distress.  Physical exam as noted above.  Differential  includes diverticulitis, cholecystitis, pneumonia, UTI, viral URI, bacteremia, amongst other diagnoses.  Will also check ambulatory pulse ox.   I personally interpreted patient's chest x-ray, which demonstrated no large focal consolidations concerning for pneumonia, though bilateral lower lobes appear to have some opacification that could be  pulmonary edema.  I personally interpreted patient's EKG, which demonstrated sinus rhythm with no dysrhythmias, no abnormal intervals, no acute ischemic changes.  Workup resulted with no leukocytosis, hemoglobin 10.9, normal lactate.  CMP with no severe electrolyte derangements, BUN 77 and creatinine 7.32, mildly elevated transaminases of 42.  RVP negative for COVID/flu/RSV.  At the time of handoff, urinalysis and CT imaging is pending.  If there is no obvious source to patient's fevers, she may require admission given that she is a dialysis patient and is high risk for bacteremia.  Handoff was given to Dr. Harless Lien. Please see her note for the remainder of patient's disposition.         Final Clinical Impression(s) / ED Diagnoses Final diagnoses:  Fever, unspecified fever cause    Rx / DC Orders ED Discharge Orders     None         Lorain Robson, MD 10/11/23 1610    Clay Cummins, MD 10/16/23 2231

## 2023-10-12 ENCOUNTER — Emergency Department (HOSPITAL_COMMUNITY)

## 2023-10-12 ENCOUNTER — Encounter (HOSPITAL_COMMUNITY): Payer: Self-pay | Admitting: Internal Medicine

## 2023-10-12 DIAGNOSIS — Z992 Dependence on renal dialysis: Secondary | ICD-10-CM | POA: Diagnosis not present

## 2023-10-12 DIAGNOSIS — N186 End stage renal disease: Secondary | ICD-10-CM | POA: Diagnosis not present

## 2023-10-12 DIAGNOSIS — K8020 Calculus of gallbladder without cholecystitis without obstruction: Secondary | ICD-10-CM | POA: Diagnosis not present

## 2023-10-12 DIAGNOSIS — R509 Fever, unspecified: Secondary | ICD-10-CM

## 2023-10-12 DIAGNOSIS — R109 Unspecified abdominal pain: Secondary | ICD-10-CM | POA: Diagnosis not present

## 2023-10-12 DIAGNOSIS — R4182 Altered mental status, unspecified: Secondary | ICD-10-CM | POA: Diagnosis not present

## 2023-10-12 DIAGNOSIS — R079 Chest pain, unspecified: Secondary | ICD-10-CM | POA: Diagnosis not present

## 2023-10-12 LAB — RESPIRATORY PANEL BY PCR

## 2023-10-12 LAB — GLUCOSE, CAPILLARY
Glucose-Capillary: 121 mg/dL — ABNORMAL HIGH (ref 70–99)
Glucose-Capillary: 147 mg/dL — ABNORMAL HIGH (ref 70–99)
Glucose-Capillary: 116 mg/dL — ABNORMAL HIGH (ref 70–99)

## 2023-10-12 LAB — URINALYSIS, W/ REFLEX TO CULTURE (INFECTION SUSPECTED)
Bilirubin Urine: NEGATIVE
Glucose, UA: 150 mg/dL — AB
Ketones, ur: NEGATIVE mg/dL
Nitrite: NEGATIVE
Protein, ur: 100 mg/dL — AB
Specific Gravity, Urine: 1.013 (ref 1.005–1.030)
pH: 5 (ref 5.0–8.0)

## 2023-10-12 LAB — HEMOGLOBIN A1C
Hgb A1c MFr Bld: 5.7 % — ABNORMAL HIGH (ref 4.8–5.6)
Mean Plasma Glucose: 116.89 mg/dL

## 2023-10-12 LAB — HEPATITIS B SURFACE ANTIGEN: Hepatitis B Surface Ag: NONREACTIVE

## 2023-10-12 LAB — CBG MONITORING, ED: Glucose-Capillary: 142 mg/dL — ABNORMAL HIGH (ref 70–99)

## 2023-10-12 LAB — I-STAT CG4 LACTIC ACID, ED: Lactic Acid, Venous: 0.8 mmol/L (ref 0.5–1.9)

## 2023-10-12 LAB — HIV ANTIBODY (ROUTINE TESTING W REFLEX): HIV Screen 4th Generation wRfx: NONREACTIVE

## 2023-10-12 MED ORDER — HEPARIN SODIUM (PORCINE) 1000 UNIT/ML IJ SOLN
INTRAMUSCULAR | Status: AC
  Filled 2023-10-12: qty 3

## 2023-10-12 MED ORDER — SODIUM CHLORIDE 0.9 % IV SOLN
2.0000 g | Freq: Once | INTRAVENOUS | Status: AC
  Administered 2023-10-12: 2 g via INTRAVENOUS
  Filled 2023-10-12: qty 20

## 2023-10-12 MED ORDER — ALLOPURINOL 100 MG PO TABS
100.0000 mg | ORAL_TABLET | Freq: Every morning | ORAL | Status: DC
  Administered 2023-10-12 – 2023-10-15 (×4): 100 mg via ORAL
  Filled 2023-10-12 (×4): qty 1

## 2023-10-12 MED ORDER — ALBUTEROL SULFATE (2.5 MG/3ML) 0.083% IN NEBU: 2.5000 mg | INHALATION_SOLUTION | RESPIRATORY_TRACT | Status: DC | PRN

## 2023-10-12 MED ORDER — ASPIRIN 81 MG PO TBEC
81.0000 mg | DELAYED_RELEASE_TABLET | Freq: Every morning | ORAL | Status: DC
  Administered 2023-10-12 – 2023-10-15 (×4): 81 mg via ORAL
  Filled 2023-10-12 (×4): qty 1

## 2023-10-12 MED ORDER — LORATADINE 10 MG PO TABS
10.0000 mg | ORAL_TABLET | Freq: Every morning | ORAL | Status: DC
Start: 2023-10-12 — End: 2023-10-15
  Administered 2023-10-12 – 2023-10-15 (×4): 10 mg via ORAL
  Filled 2023-10-12 (×4): qty 1

## 2023-10-12 MED ORDER — INSULIN ASPART 100 UNIT/ML IJ SOLN
0.0000 [IU] | Freq: Three times a day (TID) | INTRAMUSCULAR | Status: DC
  Administered 2023-10-13 – 2023-10-14 (×2): 1 [IU] via SUBCUTANEOUS
  Administered 2023-10-15: 2 [IU] via SUBCUTANEOUS

## 2023-10-12 MED ORDER — HEPARIN SODIUM (PORCINE) 5000 UNIT/ML IJ SOLN
5000.0000 [IU] | Freq: Three times a day (TID) | INTRAMUSCULAR | Status: DC
  Administered 2023-10-12 – 2023-10-15 (×9): 5000 [IU] via SUBCUTANEOUS
  Filled 2023-10-12 (×9): qty 1

## 2023-10-12 MED ORDER — PREGABALIN 75 MG PO CAPS
75.0000 mg | ORAL_CAPSULE | Freq: Two times a day (BID) | ORAL | Status: DC
  Administered 2023-10-12 – 2023-10-15 (×6): 75 mg via ORAL
  Filled 2023-10-12 (×6): qty 1

## 2023-10-12 MED ORDER — SODIUM CHLORIDE 0.9 % IV SOLN
500.0000 mg | Freq: Once | INTRAVENOUS | Status: AC
  Administered 2023-10-12: 500 mg via INTRAVENOUS
  Filled 2023-10-12: qty 5

## 2023-10-12 MED ORDER — VANCOMYCIN HCL 750 MG/150ML IV SOLN
750.0000 mg | INTRAVENOUS | Status: DC
  Administered 2023-10-12 – 2023-10-14 (×2): 750 mg via INTRAVENOUS
  Filled 2023-10-12: qty 150

## 2023-10-12 MED ORDER — INSULIN ASPART 100 UNIT/ML IJ SOLN
0.0000 [IU] | Freq: Every day | INTRAMUSCULAR | Status: DC
  Administered 2023-10-14: 2 [IU] via SUBCUTANEOUS

## 2023-10-12 MED ORDER — ATORVASTATIN CALCIUM 40 MG PO TABS
40.0000 mg | ORAL_TABLET | Freq: Every evening | ORAL | Status: DC
  Administered 2023-10-12 – 2023-10-14 (×3): 40 mg via ORAL
  Filled 2023-10-12 (×3): qty 1

## 2023-10-12 MED ORDER — TRAZODONE HCL 50 MG PO TABS
50.0000 mg | ORAL_TABLET | Freq: Every day | ORAL | Status: DC
  Administered 2023-10-12 – 2023-10-14 (×3): 50 mg via ORAL
  Filled 2023-10-12 (×3): qty 1

## 2023-10-12 MED ORDER — VANCOMYCIN HCL 1500 MG/300ML IV SOLN
1500.0000 mg | Freq: Once | INTRAVENOUS | Status: AC
  Administered 2023-10-12: 1500 mg via INTRAVENOUS
  Filled 2023-10-12: qty 300

## 2023-10-12 MED ORDER — SODIUM CHLORIDE 0.9% FLUSH
3.0000 mL | Freq: Two times a day (BID) | INTRAVENOUS | Status: DC
  Administered 2023-10-12 – 2023-10-15 (×6): 3 mL via INTRAVENOUS

## 2023-10-12 MED ORDER — SODIUM CHLORIDE 0.9 % IV SOLN
2.0000 g | INTRAVENOUS | Status: DC
  Administered 2023-10-12: 2 g via INTRAVENOUS
  Filled 2023-10-12 (×2): qty 12.5

## 2023-10-12 MED ORDER — ISOSORBIDE MONONITRATE ER 30 MG PO TB24
120.0000 mg | ORAL_TABLET | Freq: Every day | ORAL | Status: DC
  Administered 2023-10-12 – 2023-10-14 (×3): 120 mg via ORAL
  Filled 2023-10-12 (×3): qty 4

## 2023-10-12 MED ORDER — CARVEDILOL 25 MG PO TABS
25.0000 mg | ORAL_TABLET | Freq: Two times a day (BID) | ORAL | Status: DC
  Filled 2023-10-12 (×2): qty 1

## 2023-10-12 MED ORDER — DAPAGLIFLOZIN PROPANEDIOL 10 MG PO TABS
10.0000 mg | ORAL_TABLET | Freq: Every day | ORAL | Status: DC
  Administered 2023-10-13 – 2023-10-15 (×2): 10 mg via ORAL
  Filled 2023-10-12 (×4): qty 1

## 2023-10-12 MED ORDER — ACETAMINOPHEN 500 MG PO TABS
1000.0000 mg | ORAL_TABLET | Freq: Four times a day (QID) | ORAL | Status: DC | PRN
  Administered 2023-10-12 – 2023-10-14 (×5): 1000 mg via ORAL
  Filled 2023-10-12 (×5): qty 2

## 2023-10-12 MED ORDER — SODIUM CHLORIDE 0.9 % IV SOLN
1.0000 g | INTRAVENOUS | Status: DC
  Filled 2023-10-12: qty 10

## 2023-10-12 MED ORDER — CEFAZOLIN SODIUM-DEXTROSE 2-4 GM/100ML-% IV SOLN
INTRAVENOUS | Status: AC
  Filled 2023-10-12: qty 100

## 2023-10-12 MED ORDER — BUPROPION HCL 100 MG PO TABS
100.0000 mg | ORAL_TABLET | Freq: Two times a day (BID) | ORAL | Status: DC
  Administered 2023-10-12 – 2023-10-15 (×6): 100 mg via ORAL
  Filled 2023-10-12 (×9): qty 1

## 2023-10-12 MED ORDER — NITROGLYCERIN 0.4 MG SL SUBL: 0.4000 mg | SUBLINGUAL_TABLET | SUBLINGUAL | Status: DC | PRN

## 2023-10-12 MED ORDER — VANCOMYCIN HCL 750 MG/150ML IV SOLN
INTRAVENOUS | Status: AC
  Filled 2023-10-12: qty 150

## 2023-10-12 MED ORDER — FERROUS SULFATE 325 (65 FE) MG PO TABS
325.0000 mg | ORAL_TABLET | Freq: Two times a day (BID) | ORAL | Status: DC
  Administered 2023-10-12 – 2023-10-15 (×6): 325 mg via ORAL
  Filled 2023-10-12 (×6): qty 1

## 2023-10-12 MED ORDER — CHLORHEXIDINE GLUCONATE CLOTH 2 % EX PADS
6.0000 | MEDICATED_PAD | Freq: Every day | CUTANEOUS | Status: DC
Start: 2023-10-12 — End: 2023-10-15
  Administered 2023-10-13 – 2023-10-15 (×3): 6 via TOPICAL

## 2023-10-12 MED ORDER — VANCOMYCIN HCL 750 MG/150ML IV SOLN: 750.0000 mg | INTRAVENOUS | Status: DC

## 2023-10-12 MED ORDER — LINACLOTIDE 145 MCG PO CAPS
145.0000 ug | ORAL_CAPSULE | Freq: Every day | ORAL | Status: DC
  Administered 2023-10-13 – 2023-10-15 (×2): 145 ug via ORAL
  Filled 2023-10-12 (×4): qty 1

## 2023-10-12 MED ORDER — TRAMADOL HCL 50 MG PO TABS
50.0000 mg | ORAL_TABLET | Freq: Two times a day (BID) | ORAL | Status: DC | PRN
  Administered 2023-10-12: 50 mg via ORAL
  Filled 2023-10-12: qty 1

## 2023-10-12 MED ORDER — HYDRALAZINE HCL 50 MG PO TABS
50.0000 mg | ORAL_TABLET | Freq: Three times a day (TID) | ORAL | Status: DC
  Administered 2023-10-12: 50 mg via ORAL
  Filled 2023-10-12 (×6): qty 1

## 2023-10-12 MED ORDER — SERTRALINE HCL 25 MG PO TABS
50.0000 mg | ORAL_TABLET | Freq: Every morning | ORAL | Status: DC
Start: 2023-10-12 — End: 2023-10-15
  Administered 2023-10-12 – 2023-10-15 (×4): 50 mg via ORAL
  Filled 2023-10-12 (×4): qty 2

## 2023-10-12 MED ORDER — VANCOMYCIN HCL IN DEXTROSE 1-5 GM/200ML-% IV SOLN
1000.0000 mg | INTRAVENOUS | Status: DC
Start: 2023-10-14 — End: 2023-10-12

## 2023-10-12 NOTE — H&P (Addendum)
 History and Physical    [name] ZOX:[id number] DOB: [birth year] DOA: 10/11/2023  PCP: Charle Congo, MD   I have briefly reviewed patients previous medical reports in [facility].  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  Patient reports that even after her last dialysis treatment on Saturday of week prior to this ED visit, she had symptoms of chills and rigors but was okay until 4/28 afternoon when she was out shopping with her daughter and at around 4 PM developed intense chills and rigors, was unable to take more than 4 bites of [employer], returned home, her granddaughter had to give her a blanket while she sat in the car, thereafter patient does not recollect much but was advised by her family that she was taken into the house by her son, nearly fell but did not, had some confusion, denies passing out.  Reports some headache, sore throat, mild left ear ache.  Denies cough, chest pain or dyspnea.  No nausea, vomiting or diarrhea.  Still makes urine but no dysuria, urinary frequency, urgency or abdominal pain.  Denies skin rash, joint pain or swelling.  ED Course: Presented with fever of 103.1 F, transient tachypnea in the low 20s, normotensive, mild hypoxia of 89% on room air  Lab work significant for glucose of 194, BUN 77, creatinine 7.32, anion gap 18, serial serum lactate x 2 negative (1.2 > 0.8).  CBC without leukocytosis, hemoglobin 10.9.  Influenza A/B, RSV and SARS coronavirus 2 by RT-PCR negative.  Urine microscopy shows no nitrite, rare  bacteria and 6-10 WBCs per high-power field.  CT head without acute findings.  Chest x-ray showed no acute chest disease, does show some pulmonary vascular congestion without overt edema.  CT C/A/P without contrast: Detailed results as below.  Multiple clustered subpleural pulmonary nodules along the right major fissure.  Mosaic attenuation of the pulmonary parenchyma in keeping with air trapping related to small airway disease.  Bibasilar cylindrical bronchiectasis and volume loss, likely postinflammatory in nature.  Pulmonary artery hypertension.  No acute findings to explain her fever presentation.  Review of Systems:  All other systems reviewed and apart from HPI, are negative.  Past Medical History:  Diagnosis Date   Acute on chronic diastolic heart failure (HCC) 03/05/2021   Anemia    Arthritis    CAD (coronary artery disease)    CHF (congestive heart failure) (HCC)    CKD (chronic kidney disease) stage 4, GFR 15-29 ml/min (HCC) 03/05/2021   Colon polyps    CVA (cerebral vascular accident) (HCC)    Depression    Diabetes mellitus without complication (HCC)    type 2   Difficult intubation    Dyspnea    Hypertension    Hypertensive urgency 04/14/2021   Memory loss    mild   OSA (obstructive sleep apnea) 05/27/2021   uses CPAP 12-18 cm   Pneumonia    several times   Pure hypercholesterolemia 05/27/2021   Renal disorder    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   Stroke [facility])     Past Surgical History:  Procedure Laterality Date   A/V  FISTULAGRAM Left 08/09/2023   Procedure: A/V Fistulagram;  Surgeon: Adine Hoof, MD;  Location: [facility] INVASIVE CV LAB;  Service: Cardiovascular;  Laterality: Left;   ABDOMINAL HYSTERECTOMY     AV FISTULA PLACEMENT Left 12/09/2021   Procedure: LEFT ARTERIOVENOUS (AV) FISTULA CREATION;  Surgeon: Adine Hoof, MD;  Location: [facility] OR;  Service: Vascular;  Laterality: Left;   BIOPSY  09/08/2021   Procedure: BIOPSY;  Surgeon:  Albertina Hugger, MD;  Location: [facility] ENDOSCOPY;  Service: Gastroenterology;;   CESAREAN SECTION     x 1   ESOPHAGOGASTRODUODENOSCOPY (EGD) WITH PROPOFOL  N/A 09/08/2021   Procedure: ESOPHAGOGASTRODUODENOSCOPY (EGD) WITH PROPOFOL ;  Surgeon: Albertina Hugger, MD;  Location: [facility] ENDOSCOPY;  Service: Gastroenterology;  Laterality: N/A;  chest pain, epigastric pain   FISTULA SUPERFICIALIZATION Left 08/11/2023   Procedure: FISTULA SUPERFICIALIZATION LEFT UPPER ARM;  Surgeon: Carlene Che, MD;  Location: [facility] OR;  Service: Vascular;  Laterality: Left;   INNER EAR SURGERY Bilateral    INSERTION OF DIALYSIS CATHETER Right 08/11/2023   Procedure: INSERTION OF DIALYSIS CATHETER;  Surgeon: Carlene Che, MD;  Location: [facility] OR;  Service: Vascular;  Laterality: Right;   RIGHT HEART CATH N/A 05/14/2021   Procedure: RIGHT HEART CATH;  Surgeon: Darlis Eisenmenger, MD;  Location: [facility] INVASIVE CV LAB;  Service: Cardiovascular;  Laterality: N/A;   TONSILLECTOMY     age 89    Social History  reports that she has never smoked. She has never been exposed to tobacco smoke. She has never used smokeless tobacco. She reports that she does not currently use alcohol. She reports that she does not use drugs.  Allergies  Allergen Reactions   Metformin And Related Nausea Only   Other     Clonidine  patch causes skin irritation    Hydrocodone Itching    Patient able to tolerate when taken with Benadryl.   Oxycodone -Acetaminophen  Itching    Patient able to tolerate when taken with Benadryl.    Family History  Problem Relation Age of Onset   Hypertension Mother    Stroke Mother    Diabetes Mother    Other Mother        enlarged heart   Hypertension Father    Hypertension Sister    Diabetes Sister    CAD Sister        enlarged heart   Kidney disease Sister    Diabetes Paternal Grandmother    Hypertension Daughter    Diabetes Daughter    Hypertension Son    Diabetes Son    Breast cancer Paternal Aunt       Prior to Admission medications   Medication Sig Start Date End Date Taking? Authorizing Provider  acetaminophen  (TYLENOL ) 500 MG tablet Take 1,000 mg by mouth every 6 (six) hours as needed for moderate pain or headache.   Yes [provider]  allopurinol  (ZYLOPRIM ) 100 MG tablet Take 100 mg by mouth in the morning. 01/27/21  Yes [provider]  aspirin  EC 81 MG tablet Take 81 mg by mouth in the morning. Swallow whole.   Yes [provider]  atorvastatin  (LIPITOR) 40 MG tablet Take 1 tablet (40 mg total) by mouth every evening. 05/27/21  Yes Maudine Sos, MD  buPROPion  (WELLBUTRIN ) 100 MG tablet Take 100 mg by mouth 2 (two) times daily. 02/04/21  Yes [provider]  carvedilol  (COREG ) 25 MG tablet Take 1 tablet (25 mg total) by mouth 2 (two) times daily with a meal.  03/07/21  Yes [name], Mauricio Daniel, MD  cholecalciferol (VITAMIN D3) 25 MCG (1000 UNIT) tablet Take 2,000 Units by mouth daily.   Yes [provider]  dapagliflozin  propanediol (FARXIGA ) 10 MG TABS tablet TAKE 1 TABLET BY MOUTH DAILY BEFORE BREAKFAST. 04/20/23  Yes Milford, Arlice Bene, FNP  ferrous sulfate  325 (65 FE) MG tablet Take 325 mg by mouth 2 (two) times daily. 05/31/21  Yes [provider]  fluticasone  (FLONASE ) 50 MCG/ACT nasal spray Place 1 spray into both nostrils daily as needed for allergies or rhinitis.   Yes [provider]  hydrALAZINE  (APRESOLINE ) 100 MG tablet TAKE 1 TABLET BY MOUTH 3 TIMES DAILY. Patient taking differently: Take 50 mg by mouth 3 (three) times daily. 05/22/22  Yes Maudine Sos, MD  insulin  glargine (LANTUS ) 100 unit/mL SOPN Inject 0-10 Units into the skin at bedtime as needed (if blood gluose is above 130 take 2 units 150 and above 10 units).   Yes [provider]  isosorbide  mononitrate (IMDUR ) 120 MG 24 hr tablet Take 120 mg by mouth at bedtime.   Yes [provider]  LINZESS  145 MCG CAPS capsule TAKE 1  CAPSULE BY MOUTH DAILY BEFORE BREAKFAST. 05/12/23  Yes Zehr, Jessica D, PA-C  loratadine  (CLARITIN ) 10 MG tablet Take 10 mg by mouth in the morning. 02/14/21  Yes [provider]  Multiple Vitamin (MULTIVITAMIN ADULT) TABS Take 1 tablet by mouth daily with lunch.   Yes [provider]  nitroGLYCERIN  (NITROSTAT ) 0.4 MG SL tablet Place 1 tablet (0.4 mg total) under the tongue every 5 (five) minutes as needed for chest pain. 03/07/21  Yes [name], Curlee Doss, MD  NOVOLOG  FLEXPEN 100 UNIT/ML FlexPen Inject 5 Units into the skin 3 (three) times daily with meals. 01/12/21  Yes [provider]  OZEMPIC , 1 MG/DOSE, 4 MG/3ML SOPN Inject 1 mg into the skin every Tuesday.   Yes [provider]  pregabalin  (LYRICA ) 75 MG capsule Take 75 mg by mouth 2 (two) times daily. 02/03/21  Yes [provider]  sertraline  (ZOLOFT ) 50 MG tablet Take 50 mg by mouth in the morning. 02/01/21  Yes [provider]  tiZANidine (ZANAFLEX) 4 MG tablet Take 4 mg by mouth 2 (two) times daily as needed for muscle spasms.   Yes [provider]  traMADol  (ULTRAM ) 50 MG tablet Take 1 tablet (50 mg total) by mouth every 12 (twelve) hours as needed. 08/11/23  Yes Rhyne, Maryanna Smart, PA-C  traZODone (DESYREL) 50 MG tablet Take 50 mg by mouth at bedtime.   Yes [provider]  TURMERIC CURCUMIN PO Take 1,200 mg by mouth daily at 6 (six) AM. 600 mg each Chew   Yes [provider]  Accu-Chek Softclix Lancets lancets 3 (three) times daily. 06/26/21   [provider]  Continuous Blood Gluc Transmit (DEXCOM G6 TRANSMITTER) MISC Use one every 3 month 05/25/22   [provider]  Omega-3 Fatty Acids (FISH OIL) 1000 MG CAPS Take 1,000 mg by mouth daily.    [provider]    Physical Exam: Vitals:   10/12/23 0335 10/12/23 0345 10/12/23 0615 10/12/23 0647  BP:  (!) 121/50 (!) 117/54   Pulse: 63 62 63   Resp: 13 14 13    Temp:    98.5 F (36.9  C)  TempSrc:    Oral  SpO2: 99% 100% 100%   Weight:      Height:          Constitutional: Middle-aged  female, moderately built and nourished sitting up comfortably at edge of bed without any distress.  Does not look septic or toxic. Eyes: PERTLA, lids and conjunctivae normal ENMT: Mucous membranes are moist. Posterior pharynx clear of any exudate or lesions. Normal dentition.  Left external ear looks unremarkable, unfortunately unable to look into the ear due to nonfunctioning autoscope-will need to try again when 1 is available. Neck: supple, no masses, no thyromegaly Respiratory: Clear to auscultation without wheezing, rhonchi or crackles. No increased work of breathing. Cardiovascular: S1 & S2 heard, regular rate and rhythm. No JVD, murmurs, rubs or clicks. No pedal edema. Abdomen: Non distended. Non tender. Soft. No organomegaly or masses appreciated. No clinical Ascites. Normal bowel sounds heard. Musculoskeletal: no clubbing / cyanosis. No joint deformity upper and lower extremities. Good ROM, no contractures. Normal muscle tone.  Left upper arm AV fistula with good thrill. Skin: no rashes, lesions, ulcers. No induration Neurologic: CN 2-12 grossly intact. Sensation intact, DTR normal. Strength 5/5 in all 4 limbs.  Psychiatric: Normal judgment and insight. Alert and oriented x 3. Normal mood.     Labs on Admission: I have personally reviewed following labs and imaging studies  CBC: Recent Labs  Lab 10/11/23 2215  WBC 9.8  NEUTROABS 9.0*  HGB 10.9*  HCT 34.0*  MCV 86.3  PLT 170    Basic Metabolic Panel: Recent Labs  Lab 10/11/23 2215  NA 137  K 3.7  CL 94*  CO2 25  GLUCOSE 194*  BUN 77*  CREATININE 7.32*  CALCIUM  8.8*    Liver Function Tests: Recent Labs  Lab 10/11/23 2215  AST 42*  ALT 14  ALKPHOS 37*  BILITOT 0.6  PROT 6.9  ALBUMIN 3.4*    Urine analysis:    Component Value Date/Time   COLORURINE YELLOW 10/12/2023 0735   APPEARANCEUR HAZY (A)  10/12/2023 0735   LABSPEC 1.013 10/12/2023 0735   PHURINE 5.0 10/12/2023 0735   GLUCOSEU 150 (A) 10/12/2023 0735   HGBUR SMALL (A) 10/12/2023 0735   BILIRUBINUR NEGATIVE 10/12/2023 0735   KETONESUR NEGATIVE 10/12/2023 0735   PROTEINUR 100 (A) 10/12/2023 0735   NITRITE NEGATIVE 10/12/2023 0735   LEUKOCYTESUR SMALL (A) 10/12/2023 0735     Radiological Exams on Admission: CT CHEST ABDOMEN PELVIS WO CONTRAST Result Date: 10/12/2023 CLINICAL DATA:  Sepsis, nausea, vomiting, unspecified abdominal pain. End-stage renal disease on hemodialysis. EXAM: CT CHEST, ABDOMEN AND PELVIS WITHOUT CONTRAST TECHNIQUE: Multidetector CT imaging of the chest, abdomen and pelvis was performed following the standard protocol without IV contrast. RADIATION DOSE REDUCTION: This exam was performed according to the departmental dose-optimization program which includes automated exposure control, adjustment of the mA and/or kV according to patient size and/or use of iterative reconstruction technique. COMPARISON:  08/29/2021 FINDINGS: CT CHEST FINDINGS Cardiovascular: Extensive multi-vessel coronary artery calcification. Hypoattenuation of the cardiac blood pool in keeping with at least mild anemia. Global cardiac size within normal limits. Right internal jugular hemodialysis catheter tip seen within the superior right atrium. No pericardial effusion. Central pulmonary arteries are enlarged in keeping with changes of pulmonary arterial hypertension. Mild atherosclerotic calcification within the thoracic aorta. No aortic aneurysm. Mediastinum/Nodes: No enlarged mediastinal, hilar, or axillary lymph nodes. Thyroid  gland, trachea, and esophagus demonstrate no significant findings. Small hiatal hernia Lungs/Pleura: Bibasilar cylindrical bronchiectasis and volume loss, likely post inflammatory in nature. There is mosaic attenuation of the pulmonary parenchyma in keeping with air trapping related to small airways disease. There are  multiple clustered subpleural pulmonary nodules along  the right major fissure measuring up to 7 mm in mean diameter, indeterminate. Their multiplicity favors an inflammatory process, however, these are indeterminate. No pneumothorax or pleural effusion. Central airways are widely patent. Musculoskeletal: No acute bone abnormality. No lytic or blastic bone lesion. Osseous structures are age appropriate. CT ABDOMEN PELVIS FINDINGS Hepatobiliary: Cholelithiasis without superimposed pericholecystic inflammatory change. Liver unremarkable on this noncontrast examination. No intra or extrahepatic biliary ductal dilation. Pancreas: Unremarkable Spleen: Unremarkable Adrenals/Urinary Tract: Adrenal glands are unremarkable. Kidneys are normal, without renal calculi, focal lesion, or hydronephrosis. Bladder is unremarkable. Stomach/Bowel: Stomach is within normal limits. Appendix appears normal. No evidence of bowel wall thickening, distention, or inflammatory changes. Vascular/Lymphatic: Aortic atherosclerosis. No enlarged abdominal or pelvic lymph nodes. Reproductive: Status post hysterectomy. No adnexal masses. Other: No abdominal wall hernia or abnormality. No abdominopelvic ascites. Musculoskeletal: No acute or significant osseous findings. IMPRESSION: 1. Multiple clustered subpleural pulmonary nodules along the right major fissure measuring up to 7 mm in mean diameter. While these may be inflammatory in nature, they are indeterminate. Non-contrast chest CT at 3-6 months is recommended. If the nodules are stable at time of repeat CT, then future CT at 18-24 months (from today's scan) is considered optional for low-risk patients, but is recommended for high-risk patients. This recommendation follows the consensus statement: Guidelines for Management of Incidental Pulmonary Nodules Detected on CT Images: From the Fleischner Society 2017; Radiology 2017; 284:228-243. 2. Extensive multi-vessel coronary artery calcification.  3. Hypoattenuation of the cardiac blood pool in keeping with at least mild anemia. 4. Cholelithiasis. 5. Small hiatal hernia. 6. Mosaic attenuation of the pulmonary parenchyma in keeping with air trapping related to small airways disease. 7. Bibasilar cylindrical bronchiectasis and volume loss, likely post inflammatory in nature. 8. Enlarged central pulmonary arteries in keeping with changes of pulmonary arterial hypertension. Aortic Atherosclerosis (ICD10-I70.0). Electronically Signed   By: Worthy Heads M.D.   On: 10/12/2023 02:35   CT Head Wo Contrast Result Date: 10/12/2023 CLINICAL DATA:  ams.  Nausea, vomiting, altered mental status. EXAM: CT HEAD WITHOUT CONTRAST TECHNIQUE: Contiguous axial images were obtained from the base of the skull through the vertex without intravenous contrast. RADIATION DOSE REDUCTION: This exam was performed according to the departmental dose-optimization program which includes automated exposure control, adjustment of the mA and/or kV according to patient size and/or use of iterative reconstruction technique. COMPARISON:  04/29/2021 FINDINGS: Brain: Normal anatomic configuration. Mild patchy periventricular white matter changes are present likely reflecting the sequela of small vessel ischemia. Stable focal cortical encephalomalacia within the right temporal lobe anteriorly. No abnormal intra or extra-axial mass lesion or fluid collection. No abnormal mass effect or midline shift. No evidence of acute intracranial hemorrhage or infarct. Ventricular size is normal. Cerebellum unremarkable. Vascular: No asymmetric hyperdense vasculature at the skull base. Skull: Status post right temporal craniotomy.  No acute fracture. Sinuses/Orbits: Paranasal sinuses are clear. Orbits are unremarkable. Other: Bilateral mastoidectomy has been performed. Middle ear cavities are clear. IMPRESSION: 1. No acute intracranial abnormality. 2. Status post right temporal craniotomy. Stable focal  cortical encephalomalacia within the right temporal lobe anteriorly. 3. Mild patchy periventricular white matter changes likely reflecting the sequela of small vessel ischemia. Electronically Signed   By: Worthy Heads M.D.   On: 10/12/2023 02:16   DG Chest Portable 1 View Result Date: 10/11/2023 CLINICAL DATA:  Weakness and fevers. EXAM: PORTABLE CHEST 1 VIEW COMPARISON:  Portable chest 08/11/2023. FINDINGS: Right IJ dialysis catheter again terminates at the superior cavoatrial junction. There is  mild cardiomegaly. Vascular prominence is again noted without overt edema. The lungs are clear of infiltrates. There is no substantial pleural effusion. The mediastinum is normally outlined. There is calcification in the transverse aorta. No new osseous abnormality. IMPRESSION: 1. No evidence of acute chest disease. 2. Mild cardiomegaly and vascular prominence without overt edema. 3. Aortic atherosclerosis. Electronically Signed   By: Denman Fischer M.D.   On: 10/11/2023 22:12    EKG: Independently reviewed. Treat supportively EKG shows sinus rhythm at 90 bpm, left axis deviation, T wave inversion in aVL and?  Lead I, no acute findings.?  LVH.  QTc 432 ms.  Assessment/Plan Principal Problem:   Febrile illness Active Problems:   Resistant hypertension   Chronic diastolic CHF (congestive heart failure) (HCC)   OSA treated with BiPAP   Pure hypercholesterolemia   Essential hypertension   Type 2 diabetes mellitus with diabetic polyneuropathy, with long-term current use of insulin  (HCC)   ESRD on dialysis (HCC)     Febrile illness: Unclear etiology but concern for bacteremia related to her ESRD and hemodialysis situation.  Check blood cultures-drawn and pending.  Despite no UTI symptoms, checking urine culture due to no clear source. Complete respiratory panel also negative.  Although she got a dose of IV ceftriaxone and azithromycin on 4/28, unclear what we are treating.  Clinically she does not have  pneumonia or UTI or for that matter any definitive source except concern for bacteremia.  While in ED, started to again have bad chills and rigors but no fever yet.  Empirically start on IV vancomycin and cefepime per pharmacy pending outstanding culture results.  Acute metabolic encephalopathy Suspected due to febrile illness.  CT head without acute findings.  Resolved.  Monitor  Hypoxia without acute respiratory failure Unclear etiology.  May be due to some degree of volume overload and atelectasis.  Dialysis for volume management.  Oxygen  support and wean off as tolerated.  ESRD on TTS HD Consulted nephrology for dialysis needs.  Discussed with Dr. Zana Hesselbach  OSA on BiPAP Continue at bedtime  Essential hypertension Continue carvedilol , hydralazine , Imdur   Type II DM with renal complications NovoLog  SSI.  Titrate insulins as needed.  Hold Ozempic  and Lantus   Dyslipidemia Continue statins  CAD s/p CABG Continue aspirin , statins, beta-blockers, Imdur   Depression Continue home meds.  Chronic diastolic CHF Clinically does not appear volume overloaded.  Volume management across HD.  Pulmonary nodules Recommend outpatient follow-up.   DVT prophylaxis: Subcutaneous heparin  Code Status: Full, confirmed with patient at bedside Family Communication: None at bedside Disposition Plan:   Patient is from:  Home  Anticipated DC to: Home  Anticipated DC date: 10/13/23  Anticipated DC barriers: None   Consults called: Nephrology Admission status: MedSurg, observation  Severity of Illness: The appropriate patient status for this patient is OBSERVATION. Observation status is judged to be reasonable and necessary in order to provide the required intensity of service to ensure the patient's safety. The patient's presenting symptoms, physical exam findings, and initial radiographic and laboratory data in the context of their medical condition is felt to place them at decreased risk for  further clinical deterioration. Furthermore, it is anticipated that the patient will be medically stable for discharge from the hospital within 2 midnights of admission.     Aubrey Blas MD FACP, [facility], [facility], [facility]   [facility] [place]    To contact the attending provider between 7A-7P or the covering provider during after hours 7P-7A, please log  into the web site [email or web address] and access using universal [place] password for that web site. If you do not have the password, please call the hospital operator.  10/12/2023, 10:00 AM

## 2023-10-12 NOTE — ED Notes (Signed)
 CT called about PT dialysis schedule and how long PT has been on dialysis. PT stated she has only been on dialysis for 1 month and CT stated they would have to check with physician before doing scan

## 2023-10-12 NOTE — Progress Notes (Signed)
   10/12/23 2130  Vitals  Temp (!) 101.2 F (38.4 C)  BP (!) 140/54  MAP (mmHg) 79  BP Location Right Arm  Patient Position (if appropriate) Lying  Pulse Rate 74  ECG Heart Rate 75  Resp 19  Oxygen  Therapy  SpO2 100 %  O2 Device Room Air  During Treatment Monitoring  Blood Flow Rate (mL/min) 0 mL/min  Arterial Pressure (mmHg) -34.14 mmHg  Venous Pressure (mmHg) 105.65 mmHg  TMP (mmHg) 4.24 mmHg  Ultrafiltration Rate (mL/min) 94 mL/min  Dialysate Flow Rate (mL/min) 300 ml/min  Dialysate Potassium Concentration 3  Dialysate Calcium  Concentration 2.5  Duration of HD Treatment -hour(s) 3.33 hour(s)  Cumulative Fluid Removed (mL) per Treatment  2000.04  HD Safety Checks Performed Yes  Intra-Hemodialysis Comments Tx completed  Post Treatment  Dialyzer Clearance Lightly streaked  Liters Processed 80  Fluid Removed (mL) 2000 mL  Tolerated HD Treatment Yes  AVG/AVF Arterial Site Held (minutes) 10 minutes  AVG/AVF Venous Site Held (minutes) 7 minutes  Fistula / Graft Left Upper arm Arteriovenous fistula  Placement Date/Time: 12/09/21 1058   Placed prior to admission: No  Orientation: Left  Access Location: Upper arm  Access Type: Arteriovenous fistula  Site Condition No complications  Fistula / Graft Assessment Present;Thrill;Bruit  Status Deaccessed  Needle Size 15  Drainage Description None  Hemodialysis Catheter Right Internal jugular Double lumen Permanent (Tunneled)  Placement Date/Time: 08/11/23 1628   Placed prior to admission: No  Serial / Lot #: [id number]  Expiration Date: 03/11/28  Time Out: Correct patient;Correct site;Correct procedure  Maximum sterile barrier precautions: Hand hygiene;Sterile gown;Sterile...  Site Condition No complications  Dressing Status Antimicrobial disc/dressing in place;Clean, Dry, Intact  Interventions New dressing  Drainage Description None  Dressing Change Due 10/19/23  Post treatment catheter status Capped and Clamped   Tylenol  1000 mg  Oral given during Dialysis for back pain scale 8. Vancomycin 750mg  iv last hour Cefazolin  2 grams iv post dialysis,Pt pain is at 5.

## 2023-10-12 NOTE — ED Notes (Signed)
 PT O2 dropped to 83%, placed PT on 2L nasal canula. PT now stating at 96%.

## 2023-10-12 NOTE — ED Provider Notes (Signed)
  Provider Note MRN:  [id number]  Arrival date & time: 10/12/23    ED Course and Medical Decision Making  Assumed care of patient at sign-out or upon transfer.  Fever of unclear source awaiting advanced imaging.  Patient is well-appearing on my assessment.  She is requiring 2 L nasal cannula to maintain her saturations.  Raising further concern for possibly pulmonary source of patient's fever.  CT imaging showing some type of inflammatory nodular process within the lungs otherwise no clear source of fever.  Plan is for hospitalist admission.  Providing Coverage.  .Critical Care  Performed by: Edson Graces, MD Authorized by: Edson Graces, MD   Critical care provider statement:    Critical care time (minutes):  35   Critical care was necessary to treat or prevent imminent or life-threatening deterioration of the following conditions:  Respiratory failure   Critical care was time spent personally by me on the following activities:  Development of treatment plan with patient or surrogate, discussions with consultants, evaluation of patient's response to treatment, examination of patient, ordering and review of laboratory studies, ordering and review of radiographic studies, ordering and performing treatments and interventions, pulse oximetry, re-evaluation of patient's condition and review of old charts   Final Clinical Impressions(s) / ED Diagnoses     ICD-10-CM   1. Fever, unspecified fever cause  R50.9     2. Acute respiratory failure with hypoxia [facility])  J96.01       ED Discharge Orders     None       Discharge Instructions   None     Merrick Abe. Harless Lien, MD [facility] [email or web address]    Edson Graces, MD 10/12/23 [phone]

## 2023-10-12 NOTE — Hospital Course (Signed)
 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  Patient reports that even after her last dialysis treatment on Saturday of week prior to this ED visit, she had symptoms of chills and rigors but was okay until 4/28 afternoon when she was out shopping with her daughter and at around 4 PM developed intense chills and rigors, was unable to take more than 4 bites of [employer], returned home, her granddaughter had to give her a blanket while she sat in the car, thereafter patient does not recollect much but was advised by her family that she was taken into the house by her son, nearly fell but did not, had some confusion, denies passing out.  Reports some headache, sore throat, mild left ear ache.  Denies cough, chest pain or dyspnea.  No nausea, vomiting or diarrhea.  Still makes urine but no dysuria, urinary frequency, urgency or abdominal pain.  Denies skin rash, joint pain or swelling.   ED Course: Presented with fever of 103.1 F, transient tachypnea in the low 20s, normotensive, mild hypoxia of 89% on room air   Lab work significant for glucose of 194, BUN 77, creatinine 7.32, anion gap 18, serial serum lactate x 2 negative (1.2 > 0.8).  CBC without leukocytosis, hemoglobin 10.9.  Influenza A/B, RSV and SARS coronavirus 2 by RT-PCR negative.  Urine microscopy shows no nitrite, rare bacteria and 6-10 WBCs per high-power field.   CT head without acute findings.  Chest x-ray showed no acute chest disease, does show some pulmonary vascular congestion without overt edema.   CT C/A/P without contrast: Detailed results as below.  Multiple clustered subpleural  pulmonary nodules along the right major fissure.  Mosaic attenuation of the pulmonary parenchyma in keeping with air trapping related to small airway disease.  Bibasilar cylindrical bronchiectasis and volume loss, likely postinflammatory in nature.  Pulmonary artery hypertension.  No acute findings to explain her fever presentation.  Patient was admitted for further fever workup without localizing signs CT abdomen chest pelvis UA chest x-ray unremarkable, respiratory panel negative blood culture ordered due to concern for bacteremia and placed on IV antibiotics and admitted.  I panel negative   Subjective: Seen this am Threw up this am, has not been able to eat Overnight remains febrile 102.4 at 0030, BP 99, on room air Labs reviewed hypokalemia 3.1 creatinine at 5.4 Blood culture no growth so far Askin when can her HD catheter come out, as it has not been used for 2 weeks.  Assessment and plan:  Acute febrile illness: Unclear etiology but concern for bacteremia related to her ESRD and hemodialysis situation. Blood culture pending" so far, RVP panel negative checking urine culture due to no clear source.  Imaging unremarkable as above.  Continue empiric vancomycin and cefepime, pharmacy following, nephrology following   Acute metabolic encephalopathy Suspected due to febrile illness.  CT head without acute findings.  Resolved.  Monitor   Hypoxia without acute respiratory failure Unclear etiology. Suspect some degree of volume overload and atelectasis. Cont HD cont [place] wean as able cont IS oob ptot   ESRD on TTS HD Anemia of ESRD Secondary hyperparathyroidism: Nephrology following for dialysis monitor hemoglobin level, calcium  continue binders with  meals    OSA on BiPAP Continue at bedtime   Essential hypertension Continue carvedilol , hydralazine , Imdur    Type II DM with renal complications NovoLog  SSI.  Titrate insulins as needed.  Hold Ozempic  and Lantus     Dyslipidemia Continue statins   CAD s/p CABG Continue aspirin , statins, beta-blockers, Imdur    Depression Continue home meds.   Chronic diastolic CHF Clinically does not appear volume overloaded.  Volume management across HD.   Pulmonary nodules Recommend outpatient follow-up

## 2023-10-12 NOTE — Consult Note (Signed)
 Renal Service Consult Note [facility]  Zavannah Kemmerer 10/12/2023 Lynae Sandifer, MD Requesting Physician: Dr. Sherrod Dolphin  Reason for Consult: ESRD patient with febrile illness HPI: The patient is a 63 y.o. year-old w/ PMH as below who presented to ED this morning complaining of fevers, chills and rigors starting about 24 hours ago.  She did also had similar symptoms after her last dialysis on Saturday.  She had a bit of nausea, mild headache, sore throat and left ear ache.  No cough chest pain or shortness of breath no nausea vomiting or diarrhea.  ED temp was 103, respiratory rate 20s, normotensive and mild hypoxia of 89% on room air.  Flu and COVID were negative, chest x-ray was negative and head CT was negative.  Patient was empirically started on IV vancomycin and cefepime and blood cultures were sent out.  Patient was admitted we are asked to see for dialysis.   Pt seen in the ED.  Patient states she is feels a lot better already.  Currently no shortness of breath or abdominal pain.  Other symptoms as above.   ROS - denies CP, no joint pain, no HA, no blurry vision, no rash, no diarrhea, no nausea/ vomiting  PMH: Diastolic heart failure CAD ESRD on hemodialysis History of CVA Depression. HTN OSA Hypercholesterolemia History of stroke  Past Surgical History  Past Surgical History:  Procedure Laterality Date   A/V FISTULAGRAM Left 08/09/2023   Procedure: A/V Fistulagram;  Surgeon: Adine Hoof, MD;  Location[facility] INVASIVE CV LAB;  Service: Cardiovascular;  Laterality: Left;   ABDOMINAL HYSTERECTOMY     AV FISTULA PLACEMENT Left 12/09/2021   Procedure: LEFT ARTERIOVENOUS (AV) FISTULA CREATION;  Surgeon: Adine Hoof, MD;  Location: [facility] OR;  Service: Vascular;  Laterality: Left;   BIOPSY  09/08/2021   Procedure: BIOPSY;  Surgeon: Albertina Hugger, MD;  Location: [facility] ENDOSCOPY;  Service: Gastroenterology;;   CESAREAN SECTION     x 1    ESOPHAGOGASTRODUODENOSCOPY (EGD) WITH PROPOFOL  N/A 09/08/2021   Procedure: ESOPHAGOGASTRODUODENOSCOPY (EGD) WITH PROPOFOL ;  Surgeon: Albertina Hugger, MD;  Location: [facility] ENDOSCOPY;  Service: Gastroenterology;  Laterality: N/A;  chest pain, epigastric pain   FISTULA SUPERFICIALIZATION Left 08/11/2023   Procedure: FISTULA SUPERFICIALIZATION LEFT UPPER ARM;  Surgeon: Carlene Che, MD;  Location: [facility] OR;  Service: Vascular;  Laterality: Left;   INNER EAR SURGERY Bilateral    INSERTION OF DIALYSIS CATHETER Right 08/11/2023   Procedure: INSERTION OF DIALYSIS CATHETER;  Surgeon: Carlene Che, MD;  Location: [facility] OR;  Service: Vascular;  Laterality: Right;   RIGHT HEART CATH N/A 05/14/2021   Procedure: RIGHT HEART CATH;  Surgeon: Darlis Eisenmenger, MD;  Location: [facility] INVASIVE CV LAB;  Service: Cardiovascular;  Laterality: N/A;   TONSILLECTOMY     age 76   Family History  Family History  Problem Relation Age of Onset   Hypertension Mother    Stroke Mother    Diabetes Mother    Other Mother        enlarged heart   Hypertension Father    Hypertension Sister    Diabetes Sister    CAD Sister        enlarged heart   Kidney disease Sister    Diabetes Paternal Grandmother    Hypertension Daughter    Diabetes Daughter    Hypertension Son    Diabetes Son    Breast cancer Paternal Aunt    Social History  reports  that she has never smoked. She has never been exposed to tobacco smoke. She has never used smokeless tobacco. She reports that she does not currently use alcohol. She reports that she does not use drugs. Allergies  Allergies  Allergen Reactions   Metformin And Related Nausea Only   Other     Clonidine  patch causes skin irritation    Hydrocodone Itching    Patient able to tolerate when taken with Benadryl.   Oxycodone -Acetaminophen  Itching    Patient able to tolerate when taken with Benadryl.   Home medications Prior to Admission medications   Medication Sig Start Date End Date  Taking? Authorizing Provider  acetaminophen  (TYLENOL ) 500 MG tablet Take 1,000 mg by mouth every 6 (six) hours as needed for moderate pain or headache.   Yes [provider]  allopurinol  (ZYLOPRIM ) 100 MG tablet Take 100 mg by mouth in the morning. 01/27/21  Yes [provider]  aspirin  EC 81 MG tablet Take 81 mg by mouth in the morning. Swallow whole.   Yes [provider]  atorvastatin  (LIPITOR) 40 MG tablet Take 1 tablet (40 mg total) by mouth every evening. 05/27/21  Yes Maudine Sos, MD  buPROPion  (WELLBUTRIN ) 100 MG tablet Take 100 mg by mouth 2 (two) times daily. 02/04/21  Yes [provider]  carvedilol  (COREG ) 25 MG tablet Take 1 tablet (25 mg total) by mouth 2 (two) times daily with a meal. 03/07/21  Yes [name], Curlee Doss, MD  cholecalciferol (VITAMIN D3) 25 MCG (1000 UNIT) tablet Take 2,000 Units by mouth daily.   Yes [provider]  dapagliflozin  propanediol (FARXIGA ) 10 MG TABS tablet TAKE 1 TABLET BY MOUTH DAILY BEFORE BREAKFAST. 04/20/23  Yes Milford, Arlice Bene, FNP  ferrous sulfate  325 (65 FE) MG tablet Take 325 mg by mouth 2 (two) times daily. 05/31/21  Yes [provider]  fluticasone  (FLONASE ) 50 MCG/ACT nasal spray Place 1 spray into both nostrils daily as needed for allergies or rhinitis.   Yes [provider]  hydrALAZINE  (APRESOLINE ) 100 MG tablet TAKE 1 TABLET BY MOUTH 3 TIMES DAILY. Patient taking differently: Take 50 mg by mouth 3 (three) times daily. 05/22/22  Yes Maudine Sos, MD  insulin  glargine (LANTUS ) 100 unit/mL SOPN Inject 0-10 Units into the skin at bedtime as needed (if blood gluose is above 130 take 2 units 150 and above 10 units).   Yes [provider]  isosorbide  mononitrate (IMDUR ) 120 MG 24 hr tablet Take 120 mg by mouth at bedtime.   Yes [provider]  LINZESS  145 MCG CAPS capsule TAKE 1 CAPSULE BY MOUTH DAILY BEFORE BREAKFAST. 05/12/23  Yes Zehr, Jessica D, PA-C   loratadine  (CLARITIN ) 10 MG tablet Take 10 mg by mouth in the morning. 02/14/21  Yes [provider]  Multiple Vitamin (MULTIVITAMIN ADULT) TABS Take 1 tablet by mouth daily with lunch.   Yes [provider]  nitroGLYCERIN  (NITROSTAT ) 0.4 MG SL tablet Place 1 tablet (0.4 mg total) under the tongue every 5 (five) minutes as needed for chest pain. 03/07/21  Yes [name], Curlee Doss, MD  NOVOLOG  FLEXPEN 100 UNIT/ML FlexPen Inject 5 Units into the skin 3 (three) times daily with meals. 01/12/21  Yes [provider]  OZEMPIC , 1 MG/DOSE, 4 MG/3ML SOPN Inject 1 mg into the skin every Tuesday.   Yes [provider]  pregabalin  (LYRICA ) 75 MG capsule Take 75 mg by mouth 2 (two) times daily. 02/03/21  Yes [provider]  sertraline  (ZOLOFT )  50 MG tablet Take 50 mg by mouth in the morning. 02/01/21  Yes [provider]  tiZANidine (ZANAFLEX) 4 MG tablet Take 4 mg by mouth 2 (two) times daily as needed for muscle spasms.   Yes [provider]  traMADol  (ULTRAM ) 50 MG tablet Take 1 tablet (50 mg total) by mouth every 12 (twelve) hours as needed. 08/11/23  Yes Rhyne, Maryanna Smart, PA-C  traZODone (DESYREL) 50 MG tablet Take 50 mg by mouth at bedtime.   Yes [provider]  TURMERIC CURCUMIN PO Take 1,200 mg by mouth daily at 6 (six) AM. 600 mg each Chew   Yes [provider]  Accu-Chek Softclix Lancets lancets 3 (three) times daily. 06/26/21   [provider]  Continuous Blood Gluc Transmit (DEXCOM G6 TRANSMITTER) MISC Use one every 3 month 05/25/22   [provider]  Omega-3 Fatty Acids (FISH OIL) 1000 MG CAPS Take 1,000 mg by mouth daily.    [provider]     Vitals:   10/12/23 0615 10/12/23 0647 10/12/23 0800 10/12/23 1000  BP: (!) 117/54  (!) 120/51   Pulse: 63  62   Resp: 13  14   Temp:  98.5 F (36.9 C)  (P) 98.1 F (36.7 C)  TempSrc:  Oral    SpO2: 100%  100%   Weight:      Height:        Exam Gen alert, no distress, answers questions appropriately No rash, cyanosis or gangrene Sclera anicteric, throat clear  No jvd or bruits Chest clear bilat to bases, no rales/ wheezing RRR no MRG Abd soft ntnd no mass or ascites +bs GU deferred MS no joint effusions or deformity Ext no LE or UE edema, no other edema Neuro is alert, Ox 3 , nf    AVF with good bruit      Renal-related home meds: Coreg  25 twice daily Hydralazine  50 3 times daily Others: Trazodone, Ultram , Zoloft , Lyrica , Ozempic , insulin , Imdur , Farxiga , Wellbutrin , Lipitor, aspirin , allopurinol     OP HD: [place] T[facility] 4h   B400   97kg   AVF  Heparin  3000    Assessment/ Plan: Acute febrile illness: Tested negative for COVID and flu, chest x-ray negative.  Started on empiric IV antibiotics per primary team.  Blood cultures pending ESRD: on HD TTS.  No acute emergent indication.  Plan HD later today or tonight. HTN: Blood pressure is on the low side of normal.  Would continue to hold blood pressure meds for now. Volume: Looks euvolemic on exam, no edema on x-ray.  Small UF goal with HD today Anemia of esrd: Hgb 10.9, follow. Secondary hyperparathyroidism: Corrected calcium  in the range, continue binders with meals   Larry Poag  MD [facility] 10/12/2023, 10:07 AM  Recent Labs  Lab 10/11/23 2215  HGB 10.9*  ALBUMIN 3.4*  CALCIUM  8.8*  CREATININE 7.32*  K 3.7   Inpatient medications:  insulin  aspart  0-5 Units Subcutaneous QHS   insulin  aspart  0-6 Units Subcutaneous TID WC

## 2023-10-12 NOTE — Progress Notes (Addendum)
 Pharmacy Antibiotic Note  Maria Hudson is a 63 y.o. female admitted on 10/11/2023 with  fever of unknown origin .  Pharmacy has been consulted for cefepime / vanco dosing.  Plan: Cefepime 1 gram iv qday (give after HD on HD days) Vanco 1500 mg iv x1 followed-by 750 mg iv qTTS following HD  Height: 5\' 3"  (160 cm) Weight: 97.6 kg (215 lb 2.7 oz) IBW/kg (Calculated) : 52.4  Temp (24hrs), Avg:99.4 F (37.4 C), Min:98.1 F (36.7 C), Max:103.1 F (39.5 C)  Recent Labs  Lab 10/11/23 2215 10/11/23 2245 10/12/23 0235  WBC 9.8  --   --   CREATININE 7.32*  --   --   LATICACIDVEN  --  1.2 0.8    Estimated Creatinine Clearance: 9 mL/min (A) (by C-G formula based on SCr of 7.32 mg/dL (H)).    Allergies  Allergen Reactions   Metformin And Related Nausea Only   Other     Clonidine  patch causes skin irritation    Hydrocodone Itching    Patient able to tolerate when taken with Benadryl.   Oxycodone -Acetaminophen  Itching    Patient able to tolerate when taken with Benadryl.      Thank you for allowing pharmacy to be a part of this patient's care.  [name] BS, PharmD, BCPS Clinical Pharmacist 10/12/2023 10:44 AM  Contact: [phone] after 3 PM  "Be curious, not judgmental..." -[name]

## 2023-10-13 DIAGNOSIS — R509 Fever, unspecified: Secondary | ICD-10-CM | POA: Diagnosis not present

## 2023-10-13 DIAGNOSIS — N186 End stage renal disease: Secondary | ICD-10-CM | POA: Diagnosis not present

## 2023-10-13 DIAGNOSIS — E1122 Type 2 diabetes mellitus with diabetic chronic kidney disease: Secondary | ICD-10-CM | POA: Diagnosis not present

## 2023-10-13 DIAGNOSIS — Z992 Dependence on renal dialysis: Secondary | ICD-10-CM | POA: Diagnosis not present

## 2023-10-13 LAB — URINE CULTURE

## 2023-10-13 LAB — COMPREHENSIVE METABOLIC PANEL WITH GFR
ALT: 15 U/L (ref 0–44)
AST: 62 U/L — ABNORMAL HIGH (ref 15–41)
Albumin: 3 g/dL — ABNORMAL LOW (ref 3.5–5.0)
Alkaline Phosphatase: 34 U/L — ABNORMAL LOW (ref 38–126)
Anion gap: 17 — ABNORMAL HIGH (ref 5–15)
BUN: 45 mg/dL — ABNORMAL HIGH (ref 8–23)
CO2: 25 mmol/L (ref 22–32)
Calcium: 8.3 mg/dL — ABNORMAL LOW (ref 8.9–10.3)
Chloride: 96 mmol/L — ABNORMAL LOW (ref 98–111)
Creatinine, Ser: 5.44 mg/dL — ABNORMAL HIGH (ref 0.44–1.00)
GFR, Estimated: 8 mL/min — ABNORMAL LOW (ref >60)
Glucose, Bld: 153 mg/dL — ABNORMAL HIGH (ref 70–99)
Potassium: 3.1 mmol/L — ABNORMAL LOW (ref 3.5–5.1)
Sodium: 138 mmol/L (ref 135–145)
Total Bilirubin: 1 mg/dL (ref 0.0–1.2)
Total Protein: 6.6 g/dL (ref 6.5–8.1)

## 2023-10-13 LAB — PHOSPHORUS: Phosphorus: 6.5 mg/dL — ABNORMAL HIGH (ref 2.5–4.6)

## 2023-10-13 LAB — GLUCOSE, CAPILLARY
Glucose-Capillary: 132 mg/dL — ABNORMAL HIGH (ref 70–99)
Glucose-Capillary: 134 mg/dL — ABNORMAL HIGH (ref 70–99)
Glucose-Capillary: 162 mg/dL — ABNORMAL HIGH (ref 70–99)
Glucose-Capillary: 126 mg/dL — ABNORMAL HIGH (ref 70–99)

## 2023-10-13 LAB — HEPATITIS B SURFACE ANTIBODY, QUANTITATIVE: Hep B S AB Quant (Post): <3.5 m[IU]/mL — ABNORMAL LOW

## 2023-10-13 MED ORDER — ACETAMINOPHEN 500 MG PO TABS
1000.0000 mg | ORAL_TABLET | Freq: Once | ORAL | Status: AC
  Administered 2023-10-13: 1000 mg via ORAL
  Filled 2023-10-13: qty 2

## 2023-10-13 MED ORDER — LIDOCAINE HCL 1 % IJ SOLN
20.0000 mL | Freq: Once | INTRAMUSCULAR | Status: AC
  Administered 2023-10-13: 20 mL via INTRADERMAL
  Filled 2023-10-13: qty 20

## 2023-10-13 MED ORDER — IBUPROFEN 200 MG PO TABS: 600.0000 mg | ORAL_TABLET | ORAL | Status: DC

## 2023-10-13 MED ORDER — IBUPROFEN 200 MG PO TABS
ORAL_TABLET | ORAL | Status: AC
  Administered 2023-10-13: 200 mg
  Filled 2023-10-13: qty 3

## 2023-10-13 NOTE — Progress Notes (Signed)
  Catheter Removal Procedure Note    Diagnosis: ESRD  Plan:  Remove right diatek catheter  Consent signed:  Yes.   Time out completed:  Yes.   Coumadin:  No. PT/INR (if applicable):   Other labs:  Procedure: 1.  Sterile prepping and draping over catheter area 2. 7 ml 2% lidocaine  plain instilled at removal site. 3.  right catheter removed in its entirety with cuff in tact. 4.  Complications:  no 5. Tip of catheter sent for culture:  Yes.     Patient tolerated procedure well:  Yes.   Pressure held, no bleeding noted, dressing applied Instructions given to the pt regarding wound care and bleeding.    Maryanna Smart, PA-C 10/13/2023 4:39 PM [phone]

## 2023-10-13 NOTE — TOC CM/SW Note (Addendum)
 Transition of Care [facility]) - Inpatient Brief Assessment   Patient Details  Name: Maria Hudson MRN: [id number] Date of Birth: 1960/12/12  [facility] of [facility] CM/SW Contact:    Jeani Mill, RN Phone Number: 10/13/2023, 9:53 AM   Clinical Narrative:   Patient admitted for Acute febrile illness. No TOC needs at this time.    Transition of Care Asessment: Insurance and Status: Insurance coverage has been reviewed Patient has primary care physician: Yes Home environment has been reviewed: safe to discharge home Prior level of function:: independent Prior/Current Home Services: No current home services   Readmission risk has been reviewed: Yes Transition of care needs: no transition of care needs at this time

## 2023-10-13 NOTE — Plan of Care (Signed)

## 2023-10-13 NOTE — Progress Notes (Signed)
 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  Patient reports that even after her last dialysis treatment on Saturday of week prior to this ED visit, she had symptoms of chills and rigors but was okay until 4/28 afternoon when she was out shopping with her daughter and at around 4 PM developed intense chills and rigors, was unable to take more than 4 bites of [employer], returned home, her granddaughter had to give her a blanket while she sat in the car, thereafter patient does not recollect much but was advised by her family that she was taken into the house by her son, nearly fell but did not, had some confusion, denies passing out.  Reports some headache, sore throat, mild left ear ache.  Denies cough, chest pain or dyspnea.  No nausea, vomiting or diarrhea.  Still makes urine but no dysuria, urinary frequency, urgency or abdominal pain.  Denies skin rash, joint pain or swelling.   ED Course: Presented with fever of 103.1 F, transient tachypnea in the low 20s, normotensive, mild hypoxia of 89% on room air   Lab work significant for glucose of 194, BUN 77, creatinine 7.32, anion gap 18, serial serum lactate x 2 negative (1.2 > 0.8).  CBC without leukocytosis, hemoglobin 10.9.  Influenza A/B, RSV and SARS coronavirus 2 by RT-PCR negative.  Urine microscopy shows no nitrite, rare bacteria and 6-10 WBCs per high-power field.   CT head without acute findings.  Chest x-ray showed no acute chest disease, does show some  pulmonary vascular congestion without overt edema.   CT C/A/P without contrast: Detailed results as below.  Multiple clustered subpleural pulmonary nodules along the right major fissure.  Mosaic attenuation of the pulmonary parenchyma in keeping with air trapping related to small airway disease.  Bibasilar cylindrical bronchiectasis and volume loss, likely postinflammatory in nature.  Pulmonary artery hypertension.  No acute findings to explain her fever presentation.  Patient was admitted for further fever workup without localizing signs CT abdomen chest pelvis UA chest x-ray unremarkable, respiratory panel negative blood culture ordered due to concern for bacteremia and placed on IV antibiotics and admitted.  I panel negative   Subjective: Seen this am Threw up this am, has not been able to eat Overnight remains febrile 102.4 at 0030, BP 99, on room air Labs reviewed hypokalemia 3.1 creatinine at 5.4 Blood culture no growth so far Askin when can her HD catheter come out, as it has not been used for 2 weeks.  Assessment and plan:  Acute febrile illness: Unclear etiology but concern for bacteremia related to her ESRD and hemodialysis situation. Blood culture pending" so far, RVP panel negative checking urine culture due to no clear source.  Imaging unremarkable as above.  Continue empiric vancomycin and cefepime, pharmacy following, nephrology following   Acute metabolic encephalopathy Suspected due to febrile illness.  CT head without acute findings.  Resolved.  Monitor   Hypoxia without acute respiratory failure Unclear etiology. Suspect some degree of volume overload and atelectasis. Cont HD cont Thayer wean as able cont IS oob ptot   ESRD  on TTS HD Anemia of ESRD Secondary hyperparathyroidism: Nephrology following for dialysis monitor hemoglobin level, calcium  continue binders with meals    OSA on BiPAP Continue at bedtime   Essential hypertension Continue carvedilol ,  hydralazine , Imdur    Type II DM with renal complications NovoLog  SSI.  Titrate insulins as needed.  Hold Ozempic  and Lantus    Dyslipidemia Continue statins   CAD s/p CABG Continue aspirin , statins, beta-blockers, Imdur    Depression Continue home meds.   Chronic diastolic CHF Clinically does not appear volume overloaded.  Volume management across HD.   Pulmonary nodules Recommend outpatient follow-up   Morbid  Obesity w/ Body mass index is 40.15 kg/m.: Will benefit with PCP follow-up, weight loss,healthy lifestyle and outpatient sleep eval if not done.  DVT prophylaxis: heparin  injection 5,000 Units Start: 10/12/23 1400 Code Status:   Code Status: Full Code Family Communication: plan of care discussed with patient at bedside. Patient status is: Remains hospitalized because of severity of illness Level of care: Med-Surg   Dispo: The patient is from: home w/ daughtert            Anticipated disposition: TBD Objective: Vitals last 24 hrs: Vitals:   10/13/23 0030 10/13/23 0041 10/13/23 0430 10/13/23 0820  BP: (!) 121/49  (!) 94/47 (!) 99/48  Pulse: 83  69 65  Resp: 17  18   Temp: (!) 102.4 F (39.1 C)  98.8 F (37.1 C) 98 F (36.7 C)  TempSrc: Axillary  Oral   SpO2: (!) 84% 95% 98% 97%  Weight:      Height:        Physical Examination: General exam: alert awake, older than stated age HEENT:Oral mucosa moist, Ear/Nose WNL grossly Respiratory system: B.L clear BS, RT Chest HD cath+ c/d/i no use of accessory muscle Cardiovascular system: S1 & S2 +. Gastrointestinal system: Abdomen soft,  NT,ND,BS+ Nervous System: Alert, awake,  following commands. Extremities: LE edema neg, warm extremities Skin: No rashes,warm. MSK: Normal muscle bulk/tone.   Data Reviewed: I have personally reviewed following labs and imaging studies ( see epic result tab) CBC: Recent Labs  Lab 10/11/23 2215  WBC 9.8  NEUTROABS 9.0*  HGB 10.9*  HCT 34.0*  MCV 86.3  PLT 170    CMP: Recent Labs  Lab 10/11/23 2215 10/13/23 0548  NA 137 138  K 3.7 3.1*  CL 94* 96*  CO2 25 25  GLUCOSE 194* 153*  BUN 77* 45*  CREATININE 7.32* 5.44*  CALCIUM  8.8* 8.3*   GFR: Estimated Creatinine Clearance: 12.4 mL/min (A) (by C-G formula based on SCr of 5.44 mg/dL (H)). Recent Labs  Lab 10/11/23 2215 10/13/23 0548  AST 42* 62*  ALT 14 15  ALKPHOS 37* 34*  BILITOT 0.6 1.0  PROT 6.9 6.6  ALBUMIN 3.4* 3.0*   No results for input(s): "LIPASE", "AMYLASE" in the last 168 hours. No results for input(s): "AMMONIA" in the last 168 hours. Coagulation Profile: No results for input(s): "INR", "PROTIME" in the last 168 hours. Unresulted Labs (From admission, onward)     Start     Ordered   10/12/23 1451  Urine Culture (for pregnant, neutropenic or urologic patients or patients with an indwelling urinary catheter)  (Urine Labs)  Add-on,   AD       Comments: Hemodialysis patient presented with fever and transient altered mental status which has since resolved.  She really does not have any symptoms suggestive of UTI but unable to explain presentation with high fever.   Question:  Indication  Answer:  Altered mental status (if no other cause identified)   10/12/23 1451           Antimicrobials/Microbiology: Anti-infectives (From admission, onward)    Start     Dose/Rate Route Frequency Ordered Stop   10/14/23 1800  vancomycin (VANCOCIN) IVPB 1000 mg/200 mL premix  Status:  Discontinued        1,000 mg 200 mL/hr over 60 Minutes Intravenous Every T-Th-Sa (1800) 10/12/23 1042 10/12/23 1046   10/14/23 1800  vancomycin (VANCOREADY) IVPB 750 mg/150 mL  Status:  Discontinued        750 mg 150 mL/hr over 60 Minutes Intravenous Every T-Th-Sa (1800) 10/12/23 1046 10/12/23 1825   10/12/23 1830  vancomycin (VANCOREADY) IVPB 750 mg/150 mL        750 mg 150 mL/hr over 60 Minutes Intravenous Every T-Th-Sa (Hemodialysis) 10/12/23 1825     10/12/23 1830  ceFEPIme (MAXIPIME) 2 g in sodium  chloride 0.9 % 100 mL IVPB        2 g 200 mL/hr over 30 Minutes Intravenous Every T-Th-Sa (Hemodialysis) 10/12/23 1829     10/12/23 1130  ceFEPIme (MAXIPIME) 1 g in sodium chloride  0.9 % 100 mL IVPB  Status:  Discontinued        1 g 200 mL/hr over 30 Minutes Intravenous Every 24 hours 10/12/23 1042 10/12/23 1829   10/12/23 1130  vancomycin (VANCOREADY) IVPB 1500 mg/300 mL        1,500 mg 150 mL/hr over 120 Minutes Intravenous  Once 10/12/23 1042 10/12/23 1424   10/12/23 0345  cefTRIAXone (ROCEPHIN) 2 g in sodium chloride  0.9 % 100 mL IVPB        2 g 200 mL/hr over 30 Minutes Intravenous  Once 10/12/23 0343 10/12/23 0423   10/12/23 0345  azithromycin (ZITHROMAX) 500 mg in sodium chloride  0.9 % 250 mL IVPB        500 mg 250 mL/hr over 60 Minutes Intravenous  Once 10/12/23 0343 10/12/23 0647         Component Value Date/Time   SDES BLOOD RIGHT ARM 10/12/2023 1058   SDES BLOOD RIGHT HAND 10/12/2023 1058   SPECREQUEST  10/12/2023 1058    BOTTLES DRAWN AEROBIC AND ANAEROBIC Blood Culture results may not be optimal due to an inadequate volume of blood received in culture bottles   SPECREQUEST  10/12/2023 1058    BOTTLES DRAWN AEROBIC AND ANAEROBIC Blood Culture results may not be optimal due to an inadequate volume of blood received in culture bottles   CULT  10/12/2023 1058    NO GROWTH < 24 HOURS Performed at [facility], [address], [place], [place] [address]    CULT  10/12/2023 1058    NO GROWTH < 24 HOURS Performed at [facility], [address] [place], [place] [address]    REPTSTATUS PENDING 10/12/2023 1058   REPTSTATUS PENDING 10/12/2023 1058   Medications reviewed:  Scheduled Meds:  allopurinol   100 mg Oral q AM   aspirin  EC  81 mg Oral q AM   atorvastatin   40 mg Oral QPM   buPROPion   100 mg Oral BID   carvedilol   25 mg Oral BID WC   Chlorhexidine  Gluconate Cloth  6 each Topical Q0600   dapagliflozin  propanediol  10 mg Oral QAC breakfast   ferrous  sulfate  325 mg Oral BID   heparin   5,000 Units Subcutaneous Q8H   hydrALAZINE   50 mg Oral TID  insulin  aspart  0-5 Units Subcutaneous QHS   insulin  aspart  0-6 Units Subcutaneous TID WC   isosorbide  mononitrate  120 mg Oral QHS   linaclotide   145 mcg Oral QAC breakfast   loratadine   10 mg Oral q AM   pregabalin   75 mg Oral BID   sertraline   50 mg Oral q AM   sodium chloride  flush  3 mL Intravenous Q12H   traZODone  50 mg Oral QHS   Continuous Infusions:  ceFEPime (MAXIPIME) IV Stopped (10/12/23 2229)   vancomycin Stopped (10/12/23 2229)    Lesa Rape, MD [facility] 10/13/2023, 12:43 PM

## 2023-10-13 NOTE — Progress Notes (Addendum)
 Subjective: sitting up in bedside chair  still some nausea , no sob or cp, HD  yest  With AVF with no prob) . Still has R internal jugular TDC  co itching /per Pt using AVF As op with 15 g Needles also   Objective Vital signs in last 24 hours: Vitals:   10/13/23 0030 10/13/23 0041 10/13/23 0430 10/13/23 0820  BP: (!) 121/49  (!) 94/47 (!) 99/48  Pulse: 83  69 65  Resp: 17  18   Temp: (!) 102.4 F (39.1 C)  98.8 F (37.1 C) 98 F (36.7 C)  TempSrc: Axillary  Oral   SpO2: (!) 84% 95% 98% 97%  Weight:      Height:       Weight change: 5.2 kg  Physical Exam: General: Alert  adult female , pleasant NAD  Heart: RRR, No MRG Lungs: CTA  nonlabored   2l o2 Abdomen: NABS, soft , NT, ND Extremities: No pedal edema  Dialysis Access: LUA  AVF +. Bruit , R internal jugular TDC no dc    Renal-related home meds: Coreg  25 twice daily Hydralazine  50 3 times daily Others: Trazodone, Ultram , Zoloft , Lyrica , Ozempic , insulin , Imdur , Farxiga , Wellbutrin , Lipitor, aspirin , allopurinol       [facility] HD: [facility] 4h   2k, 2 ca  97kg  TDC    Heparin  3000 MIrcera 50 mcg  ( last given 4/22) was on venofer load thru 5/20 Hect 1 mcg   Problem/Plan:  Acute febrile Illness =  fever curve improving  on Abx , Neg covid, flu, CXR neg  , on empiric ABX , BC  no growth so far ,  ( ? TDC infxn  will need dc as AVF functional with 15 g needles) I have put in consult for IR to remove =  IR States "not their [facility] call [facility] "  Thus I called [facility] for consult  ESRD - HD TTS  On schedule  Anemia - hgb  10.9  no esa needs / hold venofer  with febrile illness  Secondary hyperparathyroidism - Corec Ca 9.1  need phos  lab added  HTN/volume - vol ok , bp lowish and last 137/66  on hydralazine / coreg   fu trend may bew able to taper down med if bp dropping with hd  Acute metabolic encephalopathy = resolved prob 2/2 fever  Charlynne Coombes, PA-C [facility] [phone] 10/13/2023,3:15 PM  LOS: 0 days    Labs: Basic Metabolic Panel: Recent Labs  Lab 10/11/23 2215 10/13/23 0548  NA 137 138  K 3.7 3.1*  CL 94* 96*  CO2 25 25  GLUCOSE 194* 153*  BUN 77* 45*  CREATININE 7.32* 5.44*  CALCIUM  8.8* 8.3*   Liver Function Tests: Recent Labs  Lab 10/11/23 2215 10/13/23 0548  AST 42* 62*  ALT 14 15  ALKPHOS 37* 34*  BILITOT 0.6 1.0  PROT 6.9 6.6  ALBUMIN 3.4* 3.0*   No results for input(s): "LIPASE", "AMYLASE" in the last 168 hours. No results for input(s): "AMMONIA" in the last 168 hours. CBC: Recent Labs  Lab 10/11/23 2215  WBC 9.8  NEUTROABS 9.0*  HGB 10.9*  HCT 34.0*  MCV 86.3  PLT 170   Cardiac Enzymes: No results for input(s): "CKTOTAL", "CKMB", "CKMBINDEX", "TROPONINI" in the last 168 hours. CBG: Recent Labs  Lab 10/12/23 1222 10/12/23 1702 10/12/23 2235 10/13/23 0821 10/13/23 1236  GLUCAP 121* 147* 116* 132* 134*    Studies/Results: CT CHEST ABDOMEN PELVIS WO CONTRAST  Result Date: 10/12/2023 CLINICAL DATA:  Sepsis, nausea, vomiting, unspecified abdominal pain. End-stage renal disease on hemodialysis. EXAM: CT CHEST, ABDOMEN AND PELVIS WITHOUT CONTRAST TECHNIQUE: Multidetector CT imaging of the chest, abdomen and pelvis was performed following the standard protocol without IV contrast. RADIATION DOSE REDUCTION: This exam was performed according to the departmental dose-optimization program which includes automated exposure control, adjustment of the mA and/or kV according to patient size and/or use of iterative reconstruction technique. COMPARISON:  08/29/2021 FINDINGS: CT CHEST FINDINGS Cardiovascular: Extensive multi-vessel coronary artery calcification. Hypoattenuation of the cardiac blood pool in keeping with at least mild anemia. Global cardiac size within normal limits. Right internal jugular hemodialysis catheter tip seen within the superior right atrium. No pericardial effusion. Central pulmonary arteries are enlarged in keeping with changes of pulmonary  arterial hypertension. Mild atherosclerotic calcification within the thoracic aorta. No aortic aneurysm. Mediastinum/Nodes: No enlarged mediastinal, hilar, or axillary lymph nodes. Thyroid  gland, trachea, and esophagus demonstrate no significant findings. Small hiatal hernia Lungs/Pleura: Bibasilar cylindrical bronchiectasis and volume loss, likely post inflammatory in nature. There is mosaic attenuation of the pulmonary parenchyma in keeping with air trapping related to small airways disease. There are multiple clustered subpleural pulmonary nodules along the right major fissure measuring up to 7 mm in mean diameter, indeterminate. Their multiplicity favors an inflammatory process, however, these are indeterminate. No pneumothorax or pleural effusion. Central airways are widely patent. Musculoskeletal: No acute bone abnormality. No lytic or blastic bone lesion. Osseous structures are age appropriate. CT ABDOMEN PELVIS FINDINGS Hepatobiliary: Cholelithiasis without superimposed pericholecystic inflammatory change. Liver unremarkable on this noncontrast examination. No intra or extrahepatic biliary ductal dilation. Pancreas: Unremarkable Spleen: Unremarkable Adrenals/Urinary Tract: Adrenal glands are unremarkable. Kidneys are normal, without renal calculi, focal lesion, or hydronephrosis. Bladder is unremarkable. Stomach/Bowel: Stomach is within normal limits. Appendix appears normal. No evidence of bowel wall thickening, distention, or inflammatory changes. Vascular/Lymphatic: Aortic atherosclerosis. No enlarged abdominal or pelvic lymph nodes. Reproductive: Status post hysterectomy. No adnexal masses. Other: No abdominal wall hernia or abnormality. No abdominopelvic ascites. Musculoskeletal: No acute or significant osseous findings. IMPRESSION: 1. Multiple clustered subpleural pulmonary nodules along the right major fissure measuring up to 7 mm in mean diameter. While these may be inflammatory in nature, they are  indeterminate. Non-contrast chest CT at 3-6 months is recommended. If the nodules are stable at time of repeat CT, then future CT at 18-24 months (from today's scan) is considered optional for low-risk patients, but is recommended for high-risk patients. This recommendation follows the consensus statement: Guidelines for Management of Incidental Pulmonary Nodules Detected on CT Images: From the Fleischner Society 2017; Radiology 2017; 284:228-243. 2. Extensive multi-vessel coronary artery calcification. 3. Hypoattenuation of the cardiac blood pool in keeping with at least mild anemia. 4. Cholelithiasis. 5. Small hiatal hernia. 6. Mosaic attenuation of the pulmonary parenchyma in keeping with air trapping related to small airways disease. 7. Bibasilar cylindrical bronchiectasis and volume loss, likely post inflammatory in nature. 8. Enlarged central pulmonary arteries in keeping with changes of pulmonary arterial hypertension. Aortic Atherosclerosis (ICD10-I70.0). Electronically Signed   By: Worthy Heads M.D.   On: 10/12/2023 02:35   CT Head Wo Contrast Result Date: 10/12/2023 CLINICAL DATA:  ams.  Nausea, vomiting, altered mental status. EXAM: CT HEAD WITHOUT CONTRAST TECHNIQUE: Contiguous axial images were obtained from the base of the skull through the vertex without intravenous contrast. RADIATION DOSE REDUCTION: This exam was performed according to the departmental dose-optimization program which includes automated exposure control, adjustment of the  mA and/or kV according to patient size and/or use of iterative reconstruction technique. COMPARISON:  04/29/2021 FINDINGS: Brain: Normal anatomic configuration. Mild patchy periventricular white matter changes are present likely reflecting the sequela of small vessel ischemia. Stable focal cortical encephalomalacia within the right temporal lobe anteriorly. No abnormal intra or extra-axial mass lesion or fluid collection. No abnormal mass effect or midline  shift. No evidence of acute intracranial hemorrhage or infarct. Ventricular size is normal. Cerebellum unremarkable. Vascular: No asymmetric hyperdense vasculature at the skull base. Skull: Status post right temporal craniotomy.  No acute fracture. Sinuses/Orbits: Paranasal sinuses are clear. Orbits are unremarkable. Other: Bilateral mastoidectomy has been performed. Middle ear cavities are clear. IMPRESSION: 1. No acute intracranial abnormality. 2. Status post right temporal craniotomy. Stable focal cortical encephalomalacia within the right temporal lobe anteriorly. 3. Mild patchy periventricular white matter changes likely reflecting the sequela of small vessel ischemia. Electronically Signed   By: Worthy Heads M.D.   On: 10/12/2023 02:16   DG Chest Portable 1 View Result Date: 10/11/2023 CLINICAL DATA:  Weakness and fevers. EXAM: PORTABLE CHEST 1 VIEW COMPARISON:  Portable chest 08/11/2023. FINDINGS: Right IJ dialysis catheter again terminates at the superior cavoatrial junction. There is mild cardiomegaly. Vascular prominence is again noted without overt edema. The lungs are clear of infiltrates. There is no substantial pleural effusion. The mediastinum is normally outlined. There is calcification in the transverse aorta. No new osseous abnormality. IMPRESSION: 1. No evidence of acute chest disease. 2. Mild cardiomegaly and vascular prominence without overt edema. 3. Aortic atherosclerosis. Electronically Signed   By: Denman Fischer M.D.   On: 10/11/2023 22:12   Medications:  ceFEPime (MAXIPIME) IV Stopped (10/12/23 2229)   vancomycin Stopped (10/12/23 2229)    allopurinol   100 mg Oral q AM   aspirin  EC  81 mg Oral q AM   atorvastatin   40 mg Oral QPM   buPROPion   100 mg Oral BID   carvedilol   25 mg Oral BID WC   Chlorhexidine  Gluconate Cloth  6 each Topical Q0600   dapagliflozin  propanediol  10 mg Oral QAC breakfast   ferrous sulfate   325 mg Oral BID   heparin   5,000 Units Subcutaneous Q8H    hydrALAZINE   50 mg Oral TID   insulin  aspart  0-5 Units Subcutaneous QHS   insulin  aspart  0-6 Units Subcutaneous TID WC   isosorbide  mononitrate  120 mg Oral QHS   linaclotide   145 mcg Oral QAC breakfast   loratadine   10 mg Oral q AM   pregabalin   75 mg Oral BID   sertraline   50 mg Oral q AM   sodium chloride  flush  3 mL Intravenous Q12H   traZODone  50 mg Oral QHS

## 2023-10-13 NOTE — Progress Notes (Signed)
 H&P    CC:  Catheter removal   HPI:  This is a 63 y.o. female in need of tunneled dialysis catheter removal.  She had the [facility] placed 08/11/2023 by Dr. Edgardo Goodwill.  She has a left arm fistula.    She came into the hospital with fever and ? TDC  infection and we have been asked to remove her TDC.     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   Stroke [facility])     FH:  Non-Contributory  Social History   Socioeconomic History   Marital status: Divorced    Spouse name: Not on file   Number of children: 2   Years of education: Not on file   Highest education level: Some college, no degree  Occupational History   Occupation: retired   Occupation: disabled  Tobacco Use   Smoking status: Never    Passive exposure: Never   Smokeless tobacco: Never  Vaping Use   Vaping status: Never Used  Substance and Sexual Activity   Alcohol use: Not Currently   Drug use: Never   Sexual activity: Not Currently    Birth control/protection: Post-menopausal, Surgical  Other Topics Concern   Not on file  Social History Narrative   Not on file   Social Drivers of Health   Financial Resource Strain: Low Risk  (05/31/2023)   Received from [facility]   Overall Financial Resource Strain (CARDIA)    Difficulty of Paying Living Expenses: Not very hard  Food Insecurity: No Food Insecurity (10/12/2023)    Hunger Vital Sign    Worried About Running Out of Food in the Last Year: Never true    Ran Out of Food in the Last Year: Never true  Transportation Needs: No Transportation Needs (10/12/2023)   PRAPARE - Administrator, Civil Service (Medical): No    Lack of Transportation (Non-Medical): No  Physical Activity: Inactive (08/23/2023)   Exercise Vital Sign    Days of Exercise per Week: 0 days    Minutes of Exercise per Session: 0 min  Stress: No Stress Concern Present (08/23/2023)   [employer] of Occupational Health - Occupational Stress Questionnaire    Feeling of Stress : Only a little  Social Connections: Moderately Isolated (08/17/2023)   Social Connection and Isolation Panel [NHANES]    Frequency of Communication with Friends and Family: More than three times a week    Frequency of Social Gatherings with Friends and Family: More than three times a week    Attends Religious Services: More than 4 times per year    Active Member of [employer] or Organizations: No    Attends Banker Meetings: Never    Marital Status: Divorced  Catering manager Violence: Not At Risk (  10/12/2023)   Humiliation, Afraid, Rape, and Kick questionnaire    Fear of Current or Ex-Partner: No    Emotionally Abused: No    Physically Abused: No    Sexually Abused: No    Allergies  Allergen Reactions   Metformin And Related Nausea Only   Other     Clonidine  patch causes skin irritation    Hydrocodone Itching    Patient able to tolerate when taken with Benadryl.   Oxycodone -Acetaminophen  Itching    Patient able to tolerate when taken with Benadryl.    Current Facility-Administered Medications  Medication Dose Route Frequency Provider Last Rate Last Admin   acetaminophen  (TYLENOL ) tablet 1,000 mg  1,000 mg Oral Q6H PRN Hongalgi, Anand D, MD   1,000 mg at 10/13/23 0037   albuterol (PROVENTIL) (2.5 MG/3ML) 0.083% nebulizer solution 2.5 mg  2.5 mg Nebulization Q2H PRN Hongalgi, Anand D, MD        allopurinol  (ZYLOPRIM ) tablet 100 mg  100 mg Oral q AM Hongalgi, Anand D, MD   100 mg at 10/13/23 2956   aspirin  EC tablet 81 mg  81 mg Oral q AM Hongalgi, Anand D, MD   81 mg at 10/13/23 2130   atorvastatin  (LIPITOR) tablet 40 mg  40 mg Oral QPM Hongalgi, Anand D, MD   40 mg at 10/12/23 2247   buPROPion  (WELLBUTRIN ) tablet 100 mg  100 mg Oral BID Hongalgi, Anand D, MD   100 mg at 10/13/23 8657   carvedilol  (COREG ) tablet 25 mg  25 mg Oral BID WC Hongalgi, Anand D, MD       ceFEPIme (MAXIPIME) 2 g in sodium chloride  0.9 % 100 mL IVPB  2 g Intravenous Q T,Th,Sa-HD Paytes, Austin A, RPH   Stopped at 10/12/23 2229   Chlorhexidine  Gluconate Cloth 2 % PADS 6 each  6 each Topical Q0600 Chucky Craver, MD   6 each at 10/13/23 8469   dapagliflozin  propanediol (FARXIGA ) tablet 10 mg  10 mg Oral QAC breakfast Hongalgi, Anand D, MD   10 mg at 10/13/23 6295   ferrous sulfate  tablet 325 mg  325 mg Oral BID Hongalgi, Anand D, MD   325 mg at 10/13/23 2841   heparin  injection 5,000 Units  5,000 Units Subcutaneous Q8H Hongalgi, Anand D, MD   5,000 Units at 10/13/23 1540   hydrALAZINE  (APRESOLINE ) tablet 50 mg  50 mg Oral TID Hongalgi, Anand D, MD   50 mg at 10/12/23 2237   insulin  aspart (novoLOG ) injection 0-5 Units  0-5 Units Subcutaneous QHS Hongalgi, Anand D, MD       insulin  aspart (novoLOG ) injection 0-6 Units  0-6 Units Subcutaneous TID WC Hongalgi, Anand D, MD       isosorbide  mononitrate (IMDUR ) 24 hr tablet 120 mg  120 mg Oral QHS Hongalgi, Anand D, MD   120 mg at 10/12/23 2237   lidocaine  (XYLOCAINE ) 1 % (with pres) injection 20 mL  20 mL Intradermal Once Laresha Bacorn J, PA-C       linaclotide  (LINZESS ) capsule 145 mcg  145 mcg Oral QAC breakfast Hongalgi, Anand D, MD   145 mcg at 10/13/23 3244   loratadine  (CLARITIN ) tablet 10 mg  10 mg Oral q AM Hongalgi, Anand D, MD   10 mg at 10/13/23 0102   nitroGLYCERIN  (NITROSTAT ) SL tablet 0.4 mg  0.4 mg Sublingual Q5 min PRN Hongalgi, Anand D, MD        pregabalin  (LYRICA ) capsule 75 mg  75 mg Oral BID Hongalgi,  Thomasene Flemings, MD   75 mg at 10/13/23 0981   sertraline  (ZOLOFT ) tablet 50 mg  50 mg Oral q AM Hongalgi, Anand D, MD   50 mg at 10/13/23 1914   sodium chloride  flush (NS) 0.9 % injection 3 mL  3 mL Intravenous Q12H Hongalgi, Anand D, MD   3 mL at 10/13/23 7829   traMADol  (ULTRAM ) tablet 50 mg  50 mg Oral Q12H PRN Hongalgi, Anand D, MD   50 mg at 10/12/23 2237   traZODone (DESYREL) tablet 50 mg  50 mg Oral QHS Hongalgi, Anand D, MD   50 mg at 10/12/23 2237   vancomycin (VANCOREADY) IVPB 750 mg/150 mL  750 mg Intravenous Q T,Th,Sa-HD Birder Bugler, RPH   Stopped at 10/12/23 2229   Facility-Administered Medications Ordered in Other Encounters  Medication Dose Route Frequency Provider Last Rate Last Admin   technetium pyrophosphate Tc 41m injection 21.2 millicurie  21.2 millicurie Intravenous Once Hazle Lites, MD        ROS:  See HPI  PHYSICAL EXAM  Vitals:   10/13/23 1548 10/13/23 1610  BP: 137/66 (!) 128/54  Pulse:  69  Resp:    Temp:  98 F (36.7 C)  SpO2:  99%    Gen:  Well developed well nourished HEENT:  normocephalic Neck:  right internal jugular TDC in place Lungs:  Non-labored Abdomen:  obese Extremities:  left arm fistula with thrill Skin:  No obvious rashes   Lab:  platelet count 170k   Impression: This is a 63 y.o. female in need of tunneled catheter removal  Plan:  Removal of right diatek catheter  Maryanna Smart, PA-C [facility] [phone] 10/13/2023 4:36 PM

## 2023-10-14 ENCOUNTER — Inpatient Hospital Stay (HOSPITAL_COMMUNITY)

## 2023-10-14 DIAGNOSIS — B9689 Other specified bacterial agents as the cause of diseases classified elsewhere: Secondary | ICD-10-CM | POA: Diagnosis not present

## 2023-10-14 DIAGNOSIS — Z992 Dependence on renal dialysis: Secondary | ICD-10-CM | POA: Diagnosis not present

## 2023-10-14 DIAGNOSIS — R509 Fever, unspecified: Secondary | ICD-10-CM | POA: Diagnosis not present

## 2023-10-14 DIAGNOSIS — E1122 Type 2 diabetes mellitus with diabetic chronic kidney disease: Secondary | ICD-10-CM | POA: Diagnosis not present

## 2023-10-14 DIAGNOSIS — D631 Anemia in chronic kidney disease: Secondary | ICD-10-CM | POA: Diagnosis not present

## 2023-10-14 DIAGNOSIS — F32A Depression, unspecified: Secondary | ICD-10-CM | POA: Diagnosis not present

## 2023-10-14 DIAGNOSIS — I700 Atherosclerosis of aorta: Secondary | ICD-10-CM | POA: Diagnosis not present

## 2023-10-14 DIAGNOSIS — E876 Hypokalemia: Secondary | ICD-10-CM | POA: Diagnosis present

## 2023-10-14 DIAGNOSIS — I120 Hypertensive chronic kidney disease with stage 5 chronic kidney disease or end stage renal disease: Secondary | ICD-10-CM | POA: Diagnosis not present

## 2023-10-14 DIAGNOSIS — T80211A Bloodstream infection due to central venous catheter, initial encounter: Secondary | ICD-10-CM | POA: Diagnosis not present

## 2023-10-14 DIAGNOSIS — A4159 Other Gram-negative sepsis: Secondary | ICD-10-CM | POA: Diagnosis not present

## 2023-10-14 DIAGNOSIS — Z79899 Other long term (current) drug therapy: Secondary | ICD-10-CM | POA: Diagnosis not present

## 2023-10-14 DIAGNOSIS — I5032 Chronic diastolic (congestive) heart failure: Secondary | ICD-10-CM | POA: Diagnosis not present

## 2023-10-14 DIAGNOSIS — Z794 Long term (current) use of insulin: Secondary | ICD-10-CM | POA: Diagnosis not present

## 2023-10-14 DIAGNOSIS — I1A0 Resistant hypertension: Secondary | ICD-10-CM | POA: Diagnosis present

## 2023-10-14 DIAGNOSIS — Y848 Other medical procedures as the cause of abnormal reaction of the patient, or of later complication, without mention of misadventure at the time of the procedure: Secondary | ICD-10-CM | POA: Diagnosis present

## 2023-10-14 DIAGNOSIS — E7800 Pure hypercholesterolemia, unspecified: Secondary | ICD-10-CM | POA: Diagnosis present

## 2023-10-14 DIAGNOSIS — I2721 Secondary pulmonary arterial hypertension: Secondary | ICD-10-CM | POA: Diagnosis not present

## 2023-10-14 DIAGNOSIS — J9811 Atelectasis: Secondary | ICD-10-CM | POA: Diagnosis not present

## 2023-10-14 DIAGNOSIS — I2510 Atherosclerotic heart disease of native coronary artery without angina pectoris: Secondary | ICD-10-CM | POA: Diagnosis present

## 2023-10-14 DIAGNOSIS — G9341 Metabolic encephalopathy: Secondary | ICD-10-CM | POA: Diagnosis not present

## 2023-10-14 DIAGNOSIS — E1142 Type 2 diabetes mellitus with diabetic polyneuropathy: Secondary | ICD-10-CM | POA: Diagnosis present

## 2023-10-14 DIAGNOSIS — N2581 Secondary hyperparathyroidism of renal origin: Secondary | ICD-10-CM | POA: Diagnosis not present

## 2023-10-14 DIAGNOSIS — A419 Sepsis, unspecified organism: Secondary | ICD-10-CM | POA: Diagnosis not present

## 2023-10-14 DIAGNOSIS — I132 Hypertensive heart and chronic kidney disease with heart failure and with stage 5 chronic kidney disease, or end stage renal disease: Secondary | ICD-10-CM | POA: Diagnosis not present

## 2023-10-14 DIAGNOSIS — N186 End stage renal disease: Secondary | ICD-10-CM | POA: Diagnosis not present

## 2023-10-14 DIAGNOSIS — R0902 Hypoxemia: Secondary | ICD-10-CM | POA: Diagnosis present

## 2023-10-14 DIAGNOSIS — Z6841 Body Mass Index (BMI) 40.0 and over, adult: Secondary | ICD-10-CM | POA: Diagnosis not present

## 2023-10-14 DIAGNOSIS — Z90710 Acquired absence of both cervix and uterus: Secondary | ICD-10-CM | POA: Diagnosis not present

## 2023-10-14 DIAGNOSIS — G4733 Obstructive sleep apnea (adult) (pediatric): Secondary | ICD-10-CM | POA: Diagnosis present

## 2023-10-14 LAB — CBC
HCT: 30.6 % — ABNORMAL LOW (ref 36.0–46.0)
Hemoglobin: 10.1 g/dL — ABNORMAL LOW (ref 12.0–15.0)
MCH: 27.8 pg (ref 26.0–34.0)
MCHC: 33 g/dL (ref 30.0–36.0)
MCV: 84.3 fL (ref 80.0–100.0)
Platelets: 165 10*3/uL (ref 150–400)
RBC: 3.63 MIL/uL — ABNORMAL LOW (ref 3.87–5.11)
RDW: 15.6 % — ABNORMAL HIGH (ref 11.5–15.5)
WBC: 12.9 10*3/uL — ABNORMAL HIGH (ref 4.0–10.5)
nRBC: 0 % (ref 0.0–0.2)

## 2023-10-14 LAB — GLUCOSE, CAPILLARY
Glucose-Capillary: 158 mg/dL — ABNORMAL HIGH (ref 70–99)
Glucose-Capillary: 203 mg/dL — ABNORMAL HIGH (ref 70–99)

## 2023-10-14 LAB — BASIC METABOLIC PANEL WITH GFR
Anion gap: 20 — ABNORMAL HIGH (ref 5–15)
BUN: 72 mg/dL — ABNORMAL HIGH (ref 8–23)
CO2: 25 mmol/L (ref 22–32)
Calcium: 7.9 mg/dL — ABNORMAL LOW (ref 8.9–10.3)
Chloride: 92 mmol/L — ABNORMAL LOW (ref 98–111)
Creatinine, Ser: 7.58 mg/dL — ABNORMAL HIGH (ref 0.44–1.00)
GFR, Estimated: 6 mL/min — ABNORMAL LOW (ref >60)
Glucose, Bld: 215 mg/dL — ABNORMAL HIGH (ref 70–99)
Potassium: 3.8 mmol/L (ref 3.5–5.1)
Sodium: 137 mmol/L (ref 135–145)

## 2023-10-14 MED ORDER — HEPARIN SODIUM (PORCINE) 1000 UNIT/ML IJ SOLN
3000.0000 [IU] | Freq: Once | INTRAMUSCULAR | Status: AC
  Administered 2023-10-14: 3000 [IU] via INTRA_ARTERIAL

## 2023-10-14 MED ORDER — DOXERCALCIFEROL 4 MCG/2ML IV SOLN
1.0000 ug | INTRAVENOUS | Status: DC
  Administered 2023-10-14: 1 ug via INTRAVENOUS

## 2023-10-14 MED ORDER — HEPARIN SODIUM (PORCINE) 1000 UNIT/ML IJ SOLN
INTRAMUSCULAR | Status: AC
  Filled 2023-10-14: qty 2

## 2023-10-14 MED ORDER — DOXERCALCIFEROL 4 MCG/2ML IV SOLN
INTRAVENOUS | Status: AC
  Filled 2023-10-14: qty 2

## 2023-10-14 MED ORDER — HEPARIN SODIUM (PORCINE) 1000 UNIT/ML IJ SOLN
INTRAMUSCULAR | Status: AC
  Filled 2023-10-14: qty 3

## 2023-10-14 MED ORDER — VANCOMYCIN HCL 750 MG/150ML IV SOLN
INTRAVENOUS | Status: AC
  Filled 2023-10-14: qty 150

## 2023-10-14 MED ORDER — SUCROFERRIC OXYHYDROXIDE 500 MG PO CHEW
500.0000 mg | CHEWABLE_TABLET | Freq: Three times a day (TID) | ORAL | Status: DC
  Administered 2023-10-14 – 2023-10-15 (×3): 500 mg via ORAL
  Filled 2023-10-14 (×6): qty 1

## 2023-10-14 MED ORDER — HEPARIN SODIUM (PORCINE) 1000 UNIT/ML DIALYSIS: 3000.0000 [IU] | Freq: Once | INTRAMUSCULAR | Status: DC

## 2023-10-14 MED ORDER — HEPARIN SODIUM (PORCINE) 1000 UNIT/ML DIALYSIS
1500.0000 [IU] | INTRAMUSCULAR | Status: AC | PRN
  Administered 2023-10-14: 1500 [IU] via INTRAVENOUS_CENTRAL

## 2023-10-14 NOTE — Progress Notes (Signed)
 Pt seen in dialysis.  Doing well this am.  Bandage is clean.  Ok to remove today and cleanse with soap and water  and apply clean bandage.   TDC tip was sent for culture.   She can f/u with [facility] as needed.   Maryanna Smart, [facility] 10/14/2023 8:47 AM

## 2023-10-14 NOTE — Progress Notes (Signed)
 [facility] Progress Note   Subjective:   Seen on HD. Reports she is doing well. Denies SOB, CP, dizziness, nausea.   Objective Vitals:   10/14/23 0854 10/14/23 0902 10/14/23 0930 10/14/23 1000  BP: (!) 125/52 (!) 142/58 (!) 150/56 (!) 149/56  Pulse: 60 61 (!) 58 (!) 58  Resp: 20 13 15 15   Temp:      TempSrc:      SpO2: 99% 99% 100% 98%  Weight:      Height:       Physical Exam General: Alert female in NAD Heart: RRR, no murmurs, rubs or gallops  Lungs:CTA bilaterally, respirations unlabored Abdomen: Soft, non-distended, +BS Extremities: No edema  Dialysis Access: AVF accessed  Additional Objective Labs: Basic Metabolic Panel: Recent Labs  Lab 10/11/23 2215 10/13/23 0548 10/13/23 0552 10/14/23 0514  NA 137 138  --  137  K 3.7 3.1*  --  3.8  CL 94* 96*  --  92*  CO2 25 25  --  25  GLUCOSE 194* 153*  --  215*  BUN 77* 45*  --  72*  CREATININE 7.32* 5.44*  --  7.58*  CALCIUM  8.8* 8.3*  --  7.9*  PHOS  --   --  6.5*  --    Liver Function Tests: Recent Labs  Lab 10/11/23 2215 10/13/23 0548  AST 42* 62*  ALT 14 15  ALKPHOS 37* 34*  BILITOT 0.6 1.0  PROT 6.9 6.6  ALBUMIN 3.4* 3.0*   No results for input(s): "LIPASE", "AMYLASE" in the last 168 hours. CBC: Recent Labs  Lab 10/11/23 2215 10/14/23 0514  WBC 9.8 12.9*  NEUTROABS 9.0*  --   HGB 10.9* 10.1*  HCT 34.0* 30.6*  MCV 86.3 84.3  PLT 170 165   Blood Culture    Component Value Date/Time   SDES HEMODIALYSIS CATHETER 10/13/2023 1734   SPECREQUEST NONE 10/13/2023 1734   CULT  10/13/2023 1734    CULTURE REINCUBATED FOR BETTER GROWTH Performed at [facility], [address] [place], [place] [address]    REPTSTATUS PENDING 10/13/2023 1734    Cardiac Enzymes: No results for input(s): "CKTOTAL", "CKMB", "CKMBINDEX", "TROPONINI" in the last 168 hours. CBG: Recent Labs  Lab 10/12/23 2235 10/13/23 0821 10/13/23 1236 10/13/23 1610 10/13/23 2116  GLUCAP 116* 132* 134*  162* 126*   Iron Studies: No results for input(s): "IRON", "TIBC", "TRANSFERRIN", "FERRITIN" in the last 72 hours. @lablastinr3 @ Studies/Results: No results found. Medications:  ceFEPime  (MAXIPIME ) IV Stopped (10/12/23 2229)   vancomycin  Stopped (10/12/23 2229)    allopurinol   100 mg Oral q AM   aspirin  EC  81 mg Oral q AM   atorvastatin   40 mg Oral QPM   buPROPion   100 mg Oral BID   carvedilol   25 mg Oral BID WC   Chlorhexidine  Gluconate Cloth  6 each Topical Q0600   dapagliflozin  propanediol  10 mg Oral QAC breakfast   ferrous sulfate   325 mg Oral BID   heparin   3,000 Units Dialysis Once in dialysis   heparin   5,000 Units Subcutaneous Q8H   hydrALAZINE   50 mg Oral TID   insulin  aspart  0-5 Units Subcutaneous QHS   insulin  aspart  0-6 Units Subcutaneous TID WC   isosorbide  mononitrate  120 mg Oral QHS   linaclotide   145 mcg Oral QAC breakfast   loratadine   10 mg Oral q AM   pregabalin   75 mg Oral BID   sertraline   50 mg Oral q  AM   sodium chloride  flush  3 mL Intravenous Q12H   traZODone   50 mg Oral QHS    Dialysis Orders: [facility] 4h   2k, 2 ca  97kg  TDC    Heparin  3000 MIrcera 50 mcg  ( last given 4/22) was on venofer load thru 5/20 Hect 1 mcg   Assessment/Plan:  Acute febrile Illness: fever curve improving  on Abx , Neg covid, flu, CXR neg, completed course of antibiotics. TDC was removed 10/13/23 by [facility] ESRD - HD TTS, continue current schedule Anemia - hgb  10.1, will resume Fe load at discharge  Secondary hyperparathyroidism - Calcium  controlled. Phos elevated, resumed hectorol  and velphoro  HTN/volume - vol ok , BP controlled. Consider tapering hydralazine  if BP drops with HD again. Acute metabolic encephalopathy-resolved  Ramona Burner, PA-C 10/14/2023, 10:25 AM  [facility] Pager: [phone]

## 2023-10-14 NOTE — Procedures (Signed)
 Received patient in bed to unit.  Alert and oriented.  Informed consent signed and in chart.   TX duration: 3.5 hours  Patient tolerated well.  Transported back to the room  Alert, without acute distress.  Hand-off given to patient's nurse.   Access used: left fistula Access issues: none  Total UF removed: 2 liters Medication(s) given: hectorol , vancomycin .,acetaminophen    Clover Dao, RN [facility]

## 2023-10-14 NOTE — Progress Notes (Signed)
 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  Reports near syncope, confusion headache, orthostatic dizziness.  Hospitalist called for admission.   Assessment & Plan:   Principal Problem:   Febrile illness Active Problems:   Resistant hypertension   Chronic diastolic CHF (congestive heart failure) (HCC)   OSA treated with BiPAP   Pure hypercholesterolemia   Essential hypertension   Type 2 diabetes mellitus with diabetic polyneuropathy, with long-term current use of insulin  (HCC)   ESRD on dialysis (HCC)   Acute febrile illness Rule out bacteremia -HD cath culture remarkable for Enterobacter Rule out UTI, multiple species on UA -High risk for bacteremia in the setting of ESRD hemodialysis, HD cath culture early results positive for Enterobacter species -If identifiable infection source, patient does meet sepsis criteria in setting of leukocytosis fever and source - Abnormal UA in the setting of dialysis and low volume urine output of limited clinical use - No other complaints, sick contacts recent travel - CT chest abdomen pelvis to rule out any further underlying abscess or infection - Continue empiric vancomycin  and cefepime   Acute metabolic encephalopathy, resolved Questionably secondary to infection, follow above  No recent medication changes otherwise   Hypoxia without acute respiratory failure Unclear etiology -likely secondary to volume overload, improving with  dialysis Walk screen pending, wean oxygen  as appropriate   ESRD on TTS HD Anemia of ESRD Secondary hyperparathyroidism: Nephrology managing, appreciate insight recommendations   OSA on BiPAP Continue at bedtime   Type II DM with renal complications NovoLog  SSI.  Hypoglycemic protocol.  Hold Ozempic  and Lantus    Dyslipidemia CAD s/p CABG Essential hypertension Continue carvedilol , hydralazine , Imdur  Statin   Depression Continue home meds.   Chronic diastolic CHF Clinically does not appear volume overloaded.  Volume management across HD.   Pulmonary nodules Recommend outpatient follow-up   DVT prophylaxis: heparin  injection 5,000 Units Start: 10/12/23 1400   Code Status:   Code Status: Full Code  Family Communication: None present  Status is: Inpatient  Dispo: The patient is from: Home              Anticipated d/c is to: Home              Anticipated d/c date is: 24 to 48 hours              Patient currently not medically stable for discharge  Consultants:  Nephrology  Procedures:  None  Antimicrobials:  Cefepime , vancomycin   Subjective: No acute issues or events overnight denies nausea vomiting diarrhea constipation any fever chills or chest pain.  Objective: Vitals:   10/13/23 2015 10/13/23 2150 10/13/23 2306 10/14/23 0437  BP: (!) 134/59   (!) 91/59  Pulse: 87   67  Resp: 16   16  Temp: (!) 102.5 F (39.2 C)  98.6 F (37 C) (!) 97.5 F (36.4 C)  TempSrc: Oral   Oral  SpO2: 95% 95%  100%  Weight:      Height:       No intake or output  data in the 24 hours ending 10/14/23 0717 Filed Weights   10/11/23 2109 10/12/23 1730  Weight: 97.6 kg 102.8 kg    Examination:  General exam: Appears calm and comfortable  Respiratory system: Clear to auscultation. Respiratory effort normal. Cardiovascular system: S1 & S2 heard, RRR. No JVD, murmurs, rubs, gallops or clicks. No pedal edema. Gastrointestinal system: Abdomen is nondistended, soft and  nontender. No organomegaly or masses felt. Normal bowel sounds heard. Central nervous system: Alert and oriented. No focal neurological deficits. Extremities: Symmetric 5 x 5 power. Skin: No rashes, lesions or ulcers Psychiatry: Judgement and insight appear normal. Mood & affect appropriate.     Data Reviewed: I have personally reviewed following labs and imaging studies  CBC: Recent Labs  Lab 10/11/23 2215 10/14/23 0514  WBC 9.8 12.9*  NEUTROABS 9.0*  --   HGB 10.9* 10.1*  HCT 34.0* 30.6*  MCV 86.3 84.3  PLT 170 165   Basic Metabolic Panel: Recent Labs  Lab 10/11/23 2215 10/13/23 0548 10/13/23 0552 10/14/23 0514  NA 137 138  --  137  K 3.7 3.1*  --  3.8  CL 94* 96*  --  92*  CO2 25 25  --  25  GLUCOSE 194* 153*  --  215*  BUN 77* 45*  --  72*  CREATININE 7.32* 5.44*  --  7.58*  CALCIUM  8.8* 8.3*  --  7.9*  PHOS  --   --  6.5*  --    GFR: Estimated Creatinine Clearance: 8.9 mL/min (A) (by C-G formula based on SCr of 7.58 mg/dL (H)). Liver Function Tests: Recent Labs  Lab 10/11/23 2215 10/13/23 0548  AST 42* 62*  ALT 14 15  ALKPHOS 37* 34*  BILITOT 0.6 1.0  PROT 6.9 6.6  ALBUMIN 3.4* 3.0*   No results for input(s): "LIPASE", "AMYLASE" in the last 168 hours. No results for input(s): "AMMONIA" in the last 168 hours. Coagulation Profile: No results for input(s): "INR", "PROTIME" in the last 168 hours. Cardiac Enzymes: No results for input(s): "CKTOTAL", "CKMB", "CKMBINDEX", "TROPONINI" in the last 168 hours. BNP (last 3 results) No results for input(s): "PROBNP" in the last 8760 hours. HbA1C: Recent Labs    10/12/23 1058  HGBA1C 5.7*   CBG: Recent Labs  Lab 10/12/23 2235 10/13/23 0821 10/13/23 1236 10/13/23 1610 10/13/23 2116  GLUCAP 116* 132* 134* 162* 126*   Lipid Profile: No results for input(s): "CHOL", "HDL", "LDLCALC", "TRIG", "CHOLHDL", "LDLDIRECT" in the last 72 hours. Thyroid  Function Tests: No results for input(s): "TSH",  "T4TOTAL", "FREET4", "T3FREE", "THYROIDAB" in the last 72 hours. Anemia Panel: No results for input(s): "VITAMINB12", "FOLATE", "FERRITIN", "TIBC", "IRON", "RETICCTPCT" in the last 72 hours. Sepsis Labs: Recent Labs  Lab 10/11/23 2245 10/12/23 0235  LATICACIDVEN 1.2 0.8    Recent Results (from the past 240 hours)  Resp panel by RT-PCR (RSV, Flu A&B, Covid) Anterior Nasal Swab     Status: None   Collection Time: 10/11/23  9:25 PM   Specimen: Anterior Nasal Swab  Result Value Ref Range Status   SARS Coronavirus 2 by RT PCR NEGATIVE NEGATIVE Final   Influenza A by PCR NEGATIVE NEGATIVE Final   Influenza B by PCR NEGATIVE NEGATIVE Final    Comment: (NOTE) The Xpert Xpress SARS-CoV-2/FLU/RSV plus assay is intended as an aid in the diagnosis of influenza from Nasopharyngeal swab specimens and should not be used as a sole basis for treatment. Nasal washings and aspirates are unacceptable for Xpert Xpress SARS-CoV-2/FLU/RSV testing.  Fact Sheet for Patients: BloggerCourse.com  Fact Sheet for Healthcare Providers: SeriousBroker.it  This test is not yet approved or cleared by the [place]  FDA and has been authorized for detection and/or diagnosis of SARS-CoV-2 by FDA under an Emergency Use Authorization (EUA). This EUA will remain in effect (meaning this test can be used) for the duration of the COVID-19 declaration under Section 564(b)(1) of the Act, 21 U.S.C. section 360bbb-3(b)(1), unless the authorization is terminated or revoked.     Resp Syncytial Virus by PCR NEGATIVE NEGATIVE Final    Comment: (NOTE) Fact Sheet for Patients: BloggerCourse.com  Fact Sheet for Healthcare Providers: SeriousBroker.it  This test is not yet approved or cleared by the [place]  FDA and has been authorized for detection and/or diagnosis of SARS-CoV-2 by FDA under an Emergency Use  Authorization (EUA). This EUA will remain in effect (meaning this test can be used) for the duration of the COVID-19 declaration under Section 564(b)(1) of the Act, 21 U.S.C. section 360bbb-3(b)(1), unless the authorization is terminated or revoked.  Performed at [facility], [address], [place], [place] [address]   Respiratory (~20 pathogens) panel by PCR     Status: None   Collection Time: 10/12/23 10:00 AM   Specimen: Nasopharyngeal Swab; Respiratory  Result Value Ref Range Status   Adenovirus NOT DETECTED NOT DETECTED Final   Coronavirus 229E NOT DETECTED NOT DETECTED Final    Comment: (NOTE) The Coronavirus on the Respiratory Panel, DOES NOT test for the novel  Coronavirus (2019 nCoV)    Coronavirus HKU1 NOT DETECTED NOT DETECTED Final   Coronavirus NL63 NOT DETECTED NOT DETECTED Final   Coronavirus OC43 NOT DETECTED NOT DETECTED Final   Metapneumovirus NOT DETECTED NOT DETECTED Final   Rhinovirus / Enterovirus NOT DETECTED NOT DETECTED Final   Influenza A NOT DETECTED NOT DETECTED Final   Influenza B NOT DETECTED NOT DETECTED Final   Parainfluenza Virus 1 NOT DETECTED NOT DETECTED Final   Parainfluenza Virus 2 NOT DETECTED NOT DETECTED Final   Parainfluenza Virus 3 NOT DETECTED NOT DETECTED Final   Parainfluenza Virus 4 NOT DETECTED NOT DETECTED Final   Respiratory Syncytial Virus NOT DETECTED NOT DETECTED Final   Bordetella pertussis NOT DETECTED NOT DETECTED Final   Bordetella Parapertussis NOT DETECTED NOT DETECTED Final   Chlamydophila pneumoniae NOT DETECTED NOT DETECTED Final   Mycoplasma pneumoniae NOT DETECTED NOT DETECTED Final    Comment: Performed at [facility], [address], [place], [place] [address]  Culture, blood (Routine X 2) w Reflex to ID Panel     Status: None (Preliminary result)   Collection Time: 10/12/23 10:58 AM   Specimen: BLOOD RIGHT ARM  Result Value Ref Range Status   Specimen Description BLOOD RIGHT ARM  Final   Special  Requests   Final    BOTTLES DRAWN AEROBIC AND ANAEROBIC Blood Culture results may not be optimal due to an inadequate volume of blood received in culture bottles   Culture   Final    NO GROWTH 2 DAYS Performed at [facility], [address], [place], [place] [address]    Report Status PENDING  Incomplete  Culture, blood (Routine X 2) w Reflex to ID Panel     Status: None (Preliminary result)   Collection Time: 10/12/23 10:58 AM   Specimen: BLOOD RIGHT HAND  Result Value Ref Range Status   Specimen Description BLOOD RIGHT HAND  Final   Special Requests   Final  BOTTLES DRAWN AEROBIC AND ANAEROBIC Blood Culture results may not be optimal due to an inadequate volume of blood received in culture bottles   Culture   Final    NO GROWTH 2 DAYS Performed at [facility], [address], [place], [place] [address]    Report Status PENDING  Incomplete  Urine Culture (for pregnant, neutropenic or urologic patients or patients with an indwelling urinary catheter)     Status: Abnormal   Collection Time: 10/12/23  2:51 PM   Specimen: Urine, Clean Catch  Result Value Ref Range Status   Specimen Description URINE, CLEAN CATCH  Final   Special Requests   Final    NONE Performed at [facility], [address] [place], [place] [address]    Culture MULTIPLE SPECIES PRESENT, SUGGEST RECOLLECTION (A)  Final   Report Status 10/13/2023 FINAL  Final         Radiology Studies: No results found.      Scheduled Meds:  allopurinol   100 mg Oral q AM   aspirin  EC  81 mg Oral q AM   atorvastatin   40 mg Oral QPM   buPROPion   100 mg Oral BID   carvedilol   25 mg Oral BID WC   Chlorhexidine  Gluconate Cloth  6 each Topical Q0600   dapagliflozin  propanediol  10 mg Oral QAC breakfast   ferrous sulfate   325 mg Oral BID   heparin   5,000 Units Subcutaneous Q8H   hydrALAZINE   50 mg Oral TID   insulin  aspart  0-5 Units Subcutaneous QHS   insulin  aspart  0-6 Units Subcutaneous TID WC    isosorbide  mononitrate  120 mg Oral QHS   linaclotide   145 mcg Oral QAC breakfast   loratadine   10 mg Oral q AM   pregabalin   75 mg Oral BID   sertraline   50 mg Oral q AM   sodium chloride  flush  3 mL Intravenous Q12H   traZODone   50 mg Oral QHS   Continuous Infusions:  ceFEPime  (MAXIPIME ) IV Stopped (10/12/23 2229)   vancomycin  Stopped (10/12/23 2229)     LOS: 0 days   Time spent:  Haydee Lipa, DO [facility]  If 7PM-7AM, please contact night-coverage [email or web address]  10/14/2023, 7:17 AM

## 2023-10-14 NOTE — Plan of Care (Signed)

## 2023-10-14 NOTE — Progress Notes (Signed)
 Pt receives out-pt HD at [facility] on TTS. Will assist as needed.   Olivia Canter Renal Navigator [phone]

## 2023-10-15 ENCOUNTER — Other Ambulatory Visit (HOSPITAL_COMMUNITY): Payer: Self-pay

## 2023-10-15 DIAGNOSIS — D631 Anemia in chronic kidney disease: Secondary | ICD-10-CM | POA: Diagnosis not present

## 2023-10-15 DIAGNOSIS — N2581 Secondary hyperparathyroidism of renal origin: Secondary | ICD-10-CM | POA: Diagnosis not present

## 2023-10-15 DIAGNOSIS — I120 Hypertensive chronic kidney disease with stage 5 chronic kidney disease or end stage renal disease: Secondary | ICD-10-CM | POA: Diagnosis not present

## 2023-10-15 DIAGNOSIS — Z992 Dependence on renal dialysis: Secondary | ICD-10-CM | POA: Diagnosis not present

## 2023-10-15 DIAGNOSIS — R509 Fever, unspecified: Secondary | ICD-10-CM | POA: Diagnosis not present

## 2023-10-15 DIAGNOSIS — B9689 Other specified bacterial agents as the cause of diseases classified elsewhere: Secondary | ICD-10-CM

## 2023-10-15 DIAGNOSIS — N186 End stage renal disease: Secondary | ICD-10-CM | POA: Diagnosis not present

## 2023-10-15 LAB — BASIC METABOLIC PANEL WITH GFR
Anion gap: 17 — ABNORMAL HIGH (ref 5–15)
BUN: 54 mg/dL — ABNORMAL HIGH (ref 8–23)
CO2: 28 mmol/L (ref 22–32)
Calcium: 8.4 mg/dL — ABNORMAL LOW (ref 8.9–10.3)
Chloride: 91 mmol/L — ABNORMAL LOW (ref 98–111)
Creatinine, Ser: 5.86 mg/dL — ABNORMAL HIGH (ref 0.44–1.00)
GFR, Estimated: 8 mL/min — ABNORMAL LOW (ref >60)
Glucose, Bld: 204 mg/dL — ABNORMAL HIGH (ref 70–99)
Potassium: 3.6 mmol/L (ref 3.5–5.1)
Sodium: 136 mmol/L (ref 135–145)

## 2023-10-15 LAB — CBC
HCT: 33.8 % — ABNORMAL LOW (ref 36.0–46.0)
Hemoglobin: 11 g/dL — ABNORMAL LOW (ref 12.0–15.0)
MCH: 27.4 pg (ref 26.0–34.0)
MCHC: 32.5 g/dL (ref 30.0–36.0)
MCV: 84.1 fL (ref 80.0–100.0)
Platelets: 194 10*3/uL (ref 150–400)
RBC: 4.02 MIL/uL (ref 3.87–5.11)
RDW: 15.5 % (ref 11.5–15.5)
WBC: 10.3 10*3/uL (ref 4.0–10.5)
nRBC: 0 % (ref 0.0–0.2)

## 2023-10-15 LAB — GLUCOSE, CAPILLARY
Glucose-Capillary: 230 mg/dL — ABNORMAL HIGH (ref 70–99)
Glucose-Capillary: 145 mg/dL — ABNORMAL HIGH (ref 70–99)
Glucose-Capillary: 250 mg/dL — ABNORMAL HIGH (ref 70–99)

## 2023-10-15 LAB — CATH TIP CULTURE

## 2023-10-15 MED ORDER — PROCHLORPERAZINE EDISYLATE 10 MG/2ML IJ SOLN
5.0000 mg | Freq: Four times a day (QID) | INTRAMUSCULAR | Status: DC | PRN
  Administered 2023-10-15 (×2): 5 mg via INTRAVENOUS
  Filled 2023-10-15 (×2): qty 2

## 2023-10-15 MED ORDER — SUCROFERRIC OXYHYDROXIDE 500 MG PO CHEW
500.0000 mg | CHEWABLE_TABLET | Freq: Three times a day (TID) | ORAL | 0 refills | Status: DC
  Filled 2023-10-15: qty 90, 30d supply, fill #0

## 2023-10-15 MED ORDER — CHLORHEXIDINE GLUCONATE CLOTH 2 % EX PADS: 6.0000 | MEDICATED_PAD | Freq: Every day | CUTANEOUS | Status: DC

## 2023-10-15 MED ORDER — POLYETHYLENE GLYCOL 3350 17 G PO PACK: 17.0000 g | PACK | Freq: Every day | ORAL | Status: DC | PRN

## 2023-10-15 NOTE — Consult Note (Signed)
 [facility]    Date of Admission:  10/11/2023     Total days of antibiotics 5               Reason for Consult: Fever   Referring Provider: Dr. Rudine Cos Primary Care Provider: Charle Congo, MD   ASSESSMENT:  [name] is a 63 year old African-American female with type 2 diabetes and end-stage renal disease on hemodialysis presenting with fever, malaise and nausea and vomiting and found to have suspected dialysis catheter infection with Enterobacter cloacae.  Blood cultures have made without growth to date.  Fevers improved once catheter removed.  All other imaging has been unremarkable with no other source of infection.  Discussed recommended plan of care to continue current dose of cefepime  for 2 dialysis sessions and then stop antibiotics.  Discontinue vancomycin .  Standard/universal precautions.  Okay for discharge from ID standpoint once antibiotics arranged for dialysis.  No ID follow-up needed.  Remaining medical and supportive care per nephrology and internal medicine.    PLAN:  Continue current dose of cefepime  with dialysis through 10/19/23 for catheter related infection.  Discontinue vancomycin  Monitor blood cultures until finalized. Ok for discharge from ID standpoint.  No ID follow up needed Remaining medical and supportive care per Nephrology and Internal Medicine.    Principal Problem:   Febrile illness Active Problems:   Resistant hypertension   Chronic diastolic CHF (congestive heart failure) (HCC)   OSA treated with BiPAP   Pure hypercholesterolemia   Essential hypertension   Type 2 diabetes mellitus with diabetic polyneuropathy, with long-term current use of insulin  (HCC)   ESRD on dialysis (HCC)    allopurinol   100 mg Oral q AM   aspirin  EC  81 mg Oral q AM   atorvastatin   40 mg Oral QPM   buPROPion   100 mg Oral BID   Chlorhexidine  Gluconate Cloth  6 each Topical Q0600   Chlorhexidine  Gluconate Cloth  6 each Topical Q0600    dapagliflozin  propanediol  10 mg Oral QAC breakfast   doxercalciferol   1 mcg Intravenous Q T,Th,Sa-HD   ferrous sulfate   325 mg Oral BID   heparin   5,000 Units Subcutaneous Q8H   insulin  aspart  0-5 Units Subcutaneous QHS   insulin  aspart  0-6 Units Subcutaneous TID WC   isosorbide  mononitrate  120 mg Oral QHS   linaclotide   145 mcg Oral QAC breakfast   loratadine   10 mg Oral q AM   pregabalin   75 mg Oral BID   sertraline   50 mg Oral q AM   sodium chloride  flush  3 mL Intravenous Q12H   sucroferric oxyhydroxide  500 mg Oral TID WC   traZODone   50 mg Oral QHS     HPI: Maria Hudson is a 63 y.o. female with previous medical history of type 2 diabetes, hypertension, and end-stage renal disease on hemodialysis presenting to the hospital with nausea/vomiting and abdominal pain.  Ms. Enos began having some discomfort around her catheter site approximately 3 weeks ago with some soreness without fevers, chills, or sweats.  Presented to the ED with 2-day history of worsening malaise and chills and occasional cough.  Febrile on admission with a temperature of 103.1 F with no leukocytosis.  Chest x-ray unremarkable.  CT head performed secondary to altered mental status with no intracranial abnormalities.  CT chest abdomen and pelvis with subpleural pulmonary nodules along the right major fissure possibly representing inflammation and are indeterminant otherwise without acute findings.  Started on broad-spectrum antibiotics following blood cultures which have remained without growth to date.  Catheter was removed on 10/13/2023 and catheter tip culture showing Enterobacter cloacae.  Fevers improved following catheter removal.  Receiving dialysis through left arm fistula.  [name] is feeling much improved since arriving to the hospital and closer to baseline.  No recent procedures, travel, or illnesses.  Review of Systems: Review of Systems  Constitutional:  Negative for chills, fever and weight  loss.  Respiratory:  Negative for cough, shortness of breath and wheezing.   Cardiovascular:  Negative for chest pain and leg swelling.  Gastrointestinal:  Negative for abdominal pain, constipation, diarrhea, nausea and vomiting.  Skin:  Negative for rash.     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   Stroke (HCC)     Social History   Tobacco Use   Smoking status: Never    Passive exposure: Never   Smokeless tobacco: Never  Vaping Use   Vaping status: Never Used  Substance Use Topics   Alcohol use: Not Currently   Drug use: Never    Family History  Problem Relation Age of Onset   Hypertension Mother    Stroke Mother    Diabetes Mother    Other Mother        enlarged heart   Hypertension Father    Hypertension Sister    Diabetes Sister    CAD Sister        enlarged heart   Kidney disease Sister    Diabetes Paternal Grandmother    Hypertension Daughter    Diabetes Daughter    Hypertension Son    Diabetes Son    Breast cancer Paternal Aunt     Allergies  Allergen Reactions   Metformin And Related Nausea Only   Other     Clonidine  patch causes skin irritation    Hydrocodone Itching    Patient able to tolerate when taken with Benadryl.   Oxycodone -Acetaminophen  Itching    Patient able to tolerate when taken with Benadryl.    OBJECTIVE: Blood pressure (!) 124/55,  pulse 72, temperature 98.7 F (37.1 C), temperature source Oral, resp. rate 18, height 5\' 3"  (1.6 m), weight 95.9 kg, SpO2 96%.  Physical Exam Constitutional:      General: She is not in acute distress.    Appearance: She is well-developed.  Cardiovascular:     Rate and Rhythm: Normal rate and regular rhythm.     Heart sounds: Normal heart sounds.  Pulmonary:     Effort: Pulmonary effort is normal.     Breath sounds: Normal breath sounds.  Skin:    General: Skin is warm and dry.  Neurological:     Mental Status: She is alert and oriented to person, place, and  time.  Psychiatric:        Behavior: Behavior normal.        Thought Content: Thought content normal.        Judgment: Judgment normal.     Lab Results Lab Results  Component Value Date   WBC 10.3 10/15/2023   HGB 11.0 (L) 10/15/2023   HCT 33.8 (L) 10/15/2023   MCV 84.1 10/15/2023   PLT 194 10/15/2023    Lab Results  Component Value Date   CREATININE 5.86 (H) 10/15/2023   BUN 54 (H) 10/15/2023   NA 136 10/15/2023   K 3.6 10/15/2023   CL 91 (L) 10/15/2023   CO2 28 10/15/2023    Lab Results  Component Value Date   ALT 15 10/13/2023   AST 62 (H) 10/13/2023   ALKPHOS 34 (L) 10/13/2023   BILITOT 1.0 10/13/2023     Microbiology: Recent Results (from the past 240 hours)  Resp panel by RT-PCR (RSV, Flu A&B, Covid) Anterior Nasal Swab     Status: None   Collection Time: 10/11/23  9:25 PM   Specimen: Anterior Nasal Swab  Result Value Ref Range Status   SARS Coronavirus 2 by RT PCR NEGATIVE NEGATIVE Final   Influenza A by PCR NEGATIVE NEGATIVE Final   Influenza B by PCR NEGATIVE NEGATIVE Final    Comment: (NOTE) The Xpert Xpress SARS-CoV-2/FLU/RSV plus assay is intended as an aid in the diagnosis of influenza from Nasopharyngeal swab specimens and should not be used as a sole basis for treatment. Nasal washings and aspirates are unacceptable for Xpert Xpress SARS-CoV-2/FLU/RSV testing.  Fact Sheet for  Patients: BloggerCourse.com  Fact Sheet for Healthcare Providers: SeriousBroker.it  This test is not yet approved or cleared by the [place]  FDA and has been authorized for detection and/or diagnosis of SARS-CoV-2 by FDA under an Emergency Use Authorization (EUA). This EUA will remain in effect (meaning this test can be used) for the duration of the COVID-19 declaration under Section 564(b)(1) of the Act, 21 U.S.C. section 360bbb-3(b)(1), unless the authorization is terminated or revoked.     Resp Syncytial Virus by PCR NEGATIVE NEGATIVE Final    Comment: (NOTE) Fact Sheet for Patients: BloggerCourse.com  Fact Sheet for Healthcare Providers: SeriousBroker.it  This test is not yet approved or cleared by the [place]  FDA and has been authorized for detection and/or diagnosis of SARS-CoV-2 by FDA under an Emergency Use Authorization (EUA). This EUA will remain in effect (meaning this test can be used) for the duration of the COVID-19 declaration under Section 564(b)(1) of the Act, 21 U.S.C. section 360bbb-3(b)(1), unless the authorization is terminated or revoked.  Performed at [facility], [address], [place], [place] [address]   Respiratory (~20 pathogens) panel by PCR     Status: None   Collection Time: 10/12/23 10:00 AM   Specimen: Nasopharyngeal Swab; Respiratory  Result Value Ref Range Status   Adenovirus NOT DETECTED NOT DETECTED Final   Coronavirus 229E NOT DETECTED NOT DETECTED Final    Comment: (NOTE) The Coronavirus on the Respiratory Panel, DOES NOT test for the novel  Coronavirus (2019 nCoV)    Coronavirus HKU1 NOT DETECTED NOT DETECTED Final   Coronavirus NL63 NOT DETECTED NOT DETECTED Final   Coronavirus OC43 NOT DETECTED NOT DETECTED Final   Metapneumovirus NOT DETECTED NOT DETECTED Final   Rhinovirus / Enterovirus NOT DETECTED NOT  DETECTED Final   Influenza A NOT DETECTED NOT DETECTED Final   Influenza B NOT DETECTED NOT  DETECTED Final   Parainfluenza Virus 1 NOT DETECTED NOT DETECTED Final   Parainfluenza Virus 2 NOT DETECTED NOT DETECTED Final   Parainfluenza Virus 3 NOT DETECTED NOT DETECTED Final   Parainfluenza Virus 4 NOT DETECTED NOT DETECTED Final   Respiratory Syncytial Virus NOT DETECTED NOT DETECTED Final   Bordetella pertussis NOT DETECTED NOT DETECTED Final   Bordetella Parapertussis NOT DETECTED NOT DETECTED Final   Chlamydophila pneumoniae NOT DETECTED NOT DETECTED Final   Mycoplasma pneumoniae NOT DETECTED NOT DETECTED Final    Comment: Performed at [facility], [address] [place], [place] [address]  Culture, blood (Routine X 2) w Reflex to ID Panel     Status: None (Preliminary result)   Collection Time: 10/12/23 10:58 AM   Specimen: BLOOD RIGHT ARM  Result Value Ref Range Status   Specimen Description BLOOD RIGHT ARM  Final   Special Requests   Final    BOTTLES DRAWN AEROBIC AND ANAEROBIC Blood Culture results may not be optimal due to an inadequate volume of blood received in culture bottles   Culture   Final    NO GROWTH 3 DAYS Performed at [name], [address], [place], [place] [address]    Report Status PENDING  Incomplete  Culture, blood (Routine X 2) w Reflex to ID Panel     Status: None (Preliminary result)   Collection Time: 10/12/23 10:58 AM   Specimen: BLOOD RIGHT HAND  Result Value Ref Range Status   Specimen Description BLOOD RIGHT HAND  Final   Special Requests   Final    BOTTLES DRAWN AEROBIC AND ANAEROBIC Blood Culture results may not be optimal due to an inadequate volume of blood received in culture bottles   Culture   Final    NO GROWTH 3 DAYS Performed at [facility], [address] [place], [place] [address]    Report Status PENDING  Incomplete  Urine Culture (for pregnant, neutropenic or urologic patients or patients with an  indwelling urinary catheter)     Status: Abnormal   Collection Time: 10/12/23  2:51 PM   Specimen: Urine, Clean Catch  Result Value Ref Range Status   Specimen Description URINE, CLEAN CATCH  Final   Special Requests   Final    NONE Performed at [facility], [address] [place], [place] [address]    Culture MULTIPLE SPECIES PRESENT, SUGGEST RECOLLECTION (A)  Final   Report Status 10/13/2023 FINAL  Final  Cath Tip Culture     Status: Abnormal   Collection Time: 10/13/23  5:34 PM   Specimen: Hemodialysis Catheter; Other  Result Value Ref Range Status   Specimen Description HEMODIALYSIS CATHETER  Final   Special Requests   Final    NONE Performed at [facility], [address] [place], [place] [address]    Culture ENTEROBACTER CLOACAE (A)  Final   Report Status 10/15/2023 FINAL  Final   Organism ID, Bacteria ENTEROBACTER CLOACAE  Final      Susceptibility   Enterobacter cloacae - MIC*    CEFEPIME  <=0.12 SENSITIVE Sensitive     CEFTAZIDIME <=1 SENSITIVE Sensitive     CIPROFLOXACIN <=0.25 SENSITIVE Sensitive     GENTAMICIN <=1 SENSITIVE Sensitive     IMIPENEM 1 SENSITIVE Sensitive     TRIMETH/SULFA <=20 SENSITIVE Sensitive     PIP/TAZO <=4 SENSITIVE Sensitive ug/mL    * ENTEROBACTER CLOACAE     Marlan Silva, NP [facility]  10/15/2023  3:48 PM

## 2023-10-15 NOTE — Plan of Care (Signed)

## 2023-10-15 NOTE — Progress Notes (Signed)
 [facility] Progress Note   Subjective:   Seated in chair, playing games on laptop. Reports she is feeling much better. Denies SOB, CP, dizziness, nausea. Cath tip did grow enterobacter.   Objective Vitals:   10/14/23 2300 10/15/23 0111 10/15/23 0413 10/15/23 0753  BP:  (!) 123/55 (!) 135/54 (!) 124/55  Pulse:  72 71 72  Resp:  16 16 18   Temp:  98.5 F (36.9 C) 99.3 F (37.4 C) 98.7 F (37.1 C)  TempSrc:  Oral Oral Oral  SpO2: 92% 96% 96% 96%  Weight:      Height:       Physical Exam  General: Alert female in NAD Heart: RRR, no murmurs, rubs or gallops  Lungs:CTA bilaterally, respirations unlabored Abdomen: Soft, non-distended, +BS Extremities: No edema  Dialysis Access: AVF +t/b  Additional Objective Labs: Basic Metabolic Panel: Recent Labs  Lab 10/13/23 0548 10/13/23 0552 10/14/23 0514 10/15/23 0402  NA 138  --  137 136  K 3.1*  --  3.8 3.6  CL 96*  --  92* 91*  CO2 25  --  25 28  GLUCOSE 153*  --  215* 204*  BUN 45*  --  72* 54*  CREATININE 5.44*  --  7.58* 5.86*  CALCIUM  8.3*  --  7.9* 8.4*  PHOS  --  6.5*  --   --    Liver Function Tests: Recent Labs  Lab 10/11/23 2215 10/13/23 0548  AST 42* 62*  ALT 14 15  ALKPHOS 37* 34*  BILITOT 0.6 1.0  PROT 6.9 6.6  ALBUMIN 3.4* 3.0*   No results for input(s): "LIPASE", "AMYLASE" in the last 168 hours. CBC: Recent Labs  Lab 10/11/23 2215 10/14/23 0514 10/15/23 0402  WBC 9.8 12.9* 10.3  NEUTROABS 9.0*  --   --   HGB 10.9* 10.1* 11.0*  HCT 34.0* 30.6* 33.8*  MCV 86.3 84.3 84.1  PLT 170 165 194   Blood Culture    Component Value Date/Time   SDES HEMODIALYSIS CATHETER 10/13/2023 1734   SPECREQUEST NONE 10/13/2023 1734   CULT (A) 10/13/2023 1734    ENTEROBACTER SPECIES IDENTIFICATION AND SUSCEPTIBILITIES TO FOLLOW Performed at [facility], [address], [place], [place] [address]    REPTSTATUS PENDING 10/13/2023 1734    Cardiac Enzymes: No results for input(s):  "CKTOTAL", "CKMB", "CKMBINDEX", "TROPONINI" in the last 168 hours. CBG: Recent Labs  Lab 10/13/23 2116 10/14/23 1655 10/14/23 2146 10/15/23 0109 10/15/23 0752  GLUCAP 126* 158* 203* 230* 145*   Iron Studies: No results for input(s): "IRON", "TIBC", "TRANSFERRIN", "FERRITIN" in the last 72 hours. @lablastinr3 @ Studies/Results: CT CHEST ABDOMEN PELVIS WO CONTRAST Result Date: 10/14/2023 EXAM: CT CHEST, ABDOMEN AND PELVIS WITHOUT CONTRAST 10/14/2023 03:15:04 PM TECHNIQUE: CT of the chest, abdomen and pelvis was performed without the administration of intravenous contrast. Multiplanar reformatted images are provided for review. Automated exposure control, iterative reconstruction, and/or weight based adjustment of the mA/kV was utilized to reduce the radiation dose to as low as reasonably achievable. COMPARISON: 10/12/2023 CLINICAL HISTORY: Sepsis. FINDINGS: CHEST: MEDIASTINUM: Heart and pericardium are unremarkable. The central airways are clear. Moderate 3-vessel coronary atherosclerosis. LYMPH NODES: No evidence of mediastinal, hilar or axillary lymphadenopathy. LUNGS AND PLEURA: Mild right lower lobe scarring/atelectasis. No focal consolidation or pulmonary edema. No evidence of pleural effusion or pneumothorax. ABDOMEN AND PELVIS: HEPATOBILIARY: The liver is unremarkable. Gallbladder demonstrates layering tiny gallstones (image 71). No biliary ductal dilatation. SPLEEN: No acute abnormality. PANCREAS: No acute abnormality. ADRENAL GLANDS:  No acute abnormality. KIDNEYS, URETERS AND BLADDER: No stones in the kidneys or ureters. No evidence of hydronephrosis. No evidence of perinephric or periureteral stranding. Urinary bladder is unremarkable. GI AND BOWEL: Stomach demonstrates no acute abnormality. There is no evidence of bowel obstruction. No evidence of appendicitis. Status post hysterectomy. REPRODUCTIVE ORGANS: No acute abnormality. PERITONEUM AND RETROPERITONEUM: No evidence of ascites or free  air. Atherosclerotic calcifications of the abdominal aorta and branch vessels. LYMPH NODES: No evidence of lymphadenopathy. BONES AND SOFT TISSUES: No acute osseous abnormality. No focal soft tissue abnormality. IMPRESSION: 1. No acute findings. 2. Layering tiny gallstones, without associated inflammatory changes. 3. Additional stable ancillary findings, as above. Electronically signed by: Sriyesh Krishnan MD 10/14/2023 07:05 PM EDT RP Workstation: [id number]   Medications:  ceFEPime  (MAXIPIME ) IV Stopped (10/12/23 2229)   vancomycin  Stopped (10/14/23 1229)    allopurinol   100 mg Oral q AM   aspirin  EC  81 mg Oral q AM   atorvastatin   40 mg Oral QPM   buPROPion   100 mg Oral BID   Chlorhexidine  Gluconate Cloth  6 each Topical Q0600   dapagliflozin  propanediol  10 mg Oral QAC breakfast   doxercalciferol   1 mcg Intravenous Q T,Th,Sa-HD   ferrous sulfate   325 mg Oral BID   heparin   5,000 Units Subcutaneous Q8H   insulin  aspart  0-5 Units Subcutaneous QHS   insulin  aspart  0-6 Units Subcutaneous TID WC   isosorbide  mononitrate  120 mg Oral QHS   linaclotide   145 mcg Oral QAC breakfast   loratadine   10 mg Oral q AM   pregabalin   75 mg Oral BID   sertraline   50 mg Oral q AM   sodium chloride  flush  3 mL Intravenous Q12H   sucroferric oxyhydroxide  500 mg Oral TID WC   traZODone   50 mg Oral QHS    Dialysis Orders: [facility] 4h   2k, 2 ca  97kg  TDC    Heparin  3000 MIrcera 50 mcg  ( last given 4/22) was on venofer load thru 5/20 Hect 1 mcg   Assessment/Plan:  Acute febrile Illness: fever curve improving  on Abx , Neg covid, flu, CXR neg, on vancomycin  and cefepime . TDC was removed 10/13/23 by VVS, catheter tip did grow enterobacter.  ESRD - HD TTS, continue current schedule Anemia - hgb  10.1, will resume Fe load after antibiotics completed. Continue ESA Secondary hyperparathyroidism - Calcium  controlled. Phos elevated, resumed hectorol  and velphoro  HTN/volume - vol ok , BP controlled.  Consider tapering hydralazine  if BP drops with HD again. Acute metabolic encephalopathy-resolved  Ramona Burner, PA-C 10/15/2023, 10:42 AM  [facility] Pager: [phone]

## 2023-10-15 NOTE — Progress Notes (Signed)
 PHARMACY CONSULT NOTE FOR:  OUTPATIENT  PARENTERAL ANTIBIOTIC THERAPY  WITH DIALYSIS   Indication: Dialysis catheter infection s/p removal 4/30  Regimen: Cefepime  2 gm with HD TThSa  End date: 10/18/24  No formal OPAT will be done as patient will receive antibiotics with dialysis. Nephrology aware.     Thank you for allowing pharmacy to be a part of this patient's care.  Denson Flake, PharmD, BCPS, BCIDP Infectious Diseases Clinical Pharmacist Phone: [phone] 10/15/2023, 2:53 PM

## 2023-10-15 NOTE — Discharge Planning (Signed)
 [facility] Patient Discharge Orders- [facility] CLINIC: [place]  Patient's name: Maria Hudson Admit/DC Dates: 10/11/2023 - 10/15/23  Discharge Diagnoses: Acute febrile illness     Aranesp: Given: no   Date and amount of last dose: N/A  Last Hgb: 10.1 PRBC's Given: no Date/# of units: N/A ESA dose for discharge: mircera 50 mcg IV q 2 weeks  IV Iron dose at discharge: none (hold while on antibiotics)  Heparin  change: no  EDW Change: yes New EDW: 96kg   Bath Change: no  Access intervention/Change: Yes Details:TDC removed, AVF working well  Hectorol /Calcitriol change: no  Discharge Labs: Calcium  8.4 Phosphorus 8.3 Albumin 3.0 K+ 3.6  IV Antibiotics: yes Details: cefepime  2g IM q HD- last dose 10/19/23  On Coumadin?: no Last INR: Next INR: Managed By:   OTHER/APPTS/LAB ORDERS:    D/C Meds to be reconciled by nurse after every discharge.  Completed By: Ramona Burner, PA-C 10/15/2023, 5:02 PM  [facility] Pager: [phone]    Reviewed by: MD:______ RN_______

## 2023-10-15 NOTE — Progress Notes (Signed)
 Reviewed AVS, patient expressed understanding of medications, MD follow up reviewed.   Removed IV, Site clean, dry and intact.  Patient states all belongings brought to the hospital at time of admission are accounted for and packed to take home.  Pt transported to Discharge lounge to wait for transportation home.

## 2023-10-15 NOTE — Discharge Summary (Signed)
 Physician Discharge Summary  Maria Hudson ZOX:[id number] DOB: 10/27/1960 DOA: 10/11/2023  PCP: Charle Congo, MD  Admit date: 10/11/2023 Discharge date: 10/15/2023  Admitted From: Home Disposition: Home  Recommendations for Outpatient Follow-up:  Follow up with PCP in 1-2 weeks Follow-up with nephrology and dialysis as scheduled  Home Health: None Equipment/Devices: None  Discharge Condition: Stable CODE STATUS: Full Diet recommendation: Renal diet  Brief/Interim Summary: 63 year old female, lives with daughter, ambulates with the help of a cane, medical history significant for ESRD on TTS hemodialysis, 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  Reports near syncope, confusion headache, orthostatic dizziness.  Hospitalist called for admission.  Patient admitted as above with fever, HD catheter on cultures noted to grow Enterobacter.  Unclear if patient had concurrent UTI given poor urine output in setting of ESRD unclear if UA is accurate.  CT chest abdomen pelvis to rule out further infection was unremarkable.  Regardless patient improved drastically on antibiotics, no further fevers at this time, given likely source of dialysis catheter infection, now removed ID was consulted recommending ongoing antibiotics with dialysis.  At this time patient is otherwise stable and agreeable for discharge home, follow-up with nephrology and dialysis as scheduled.  Discharge Diagnoses:  Principal Problem:   Febrile illness Active Problems:   Resistant hypertension   Chronic diastolic CHF (congestive heart failure) (HCC)   OSA treated with BiPAP   Pure hypercholesterolemia   Essential hypertension   Type 2 diabetes mellitus with diabetic  polyneuropathy, with long-term current use of insulin  (HCC)   ESRD on dialysis [facility])  Acute febrile illness Infected HD cath -cultures positive for Enterobacter Rule out UTI, multiple species on UA -High risk for bacteremia in the setting of ESRD hemodialysis, HD cath culture early results positive for Enterobacter species -Continue cefepime  with dialysis Tuesday Thursday Saturday through 10/18/2024   Acute metabolic encephalopathy, resolved Questionably secondary to infection, follow above  No recent medication changes otherwise   Hypoxia without acute respiratory failure Unclear etiology -likely secondary to volume overload, improving with dialysis Walk screen pending, wean oxygen  as appropriate   ESRD on TTS HD Anemia of ESRD Secondary hyperparathyroidism: Nephrology managing, appreciate insight recommendations   OSA on BiPAP Continue at bedtime   Type II DM with renal complications NovoLog  SSI.  Hypoglycemic protocol.  Hold Ozempic  and Lantus    Dyslipidemia CAD s/p CABG Essential hypertension Continue carvedilol , hydralazine , Imdur  Statin   Depression Continue home meds.   Chronic diastolic CHF Clinically does not appear volume overloaded.  Volume management across HD.   Pulmonary nodules Recommend outpatient follow-up   Discharge Instructions   Allergies as of 10/15/2023       Reactions   Metformin And Related Nausea Only   Other    Clonidine  patch causes skin irritation    Hydrocodone Itching   Patient able to tolerate when taken with Benadryl.   Oxycodone -acetaminophen  Itching   Patient able to tolerate when taken with Benadryl.        Medication List     TAKE these medications    Accu-Chek Softclix Lancets lancets 3 (three) times daily.   acetaminophen  500 MG tablet Commonly known as: TYLENOL  Take 1,000 mg by mouth every 6 (six) hours as needed for moderate pain or headache.   allopurinol  100  MG tablet Commonly known as: ZYLOPRIM  Take 100  mg by mouth in the morning.   aspirin  EC 81 MG tablet Take 81 mg by mouth in the morning. Swallow whole.   atorvastatin  40 MG tablet Commonly known as: LIPITOR Take 1 tablet (40 mg total) by mouth every evening.   buPROPion  100 MG tablet Commonly known as: WELLBUTRIN  Take 100 mg by mouth 2 (two) times daily.   carvedilol  25 MG tablet Commonly known as: COREG  Take 1 tablet (25 mg total) by mouth 2 (two) times daily with a meal.   cholecalciferol 25 MCG (1000 UNIT) tablet Commonly known as: VITAMIN D3 Take 2,000 Units by mouth daily.   dapagliflozin  propanediol 10 MG Tabs tablet Commonly known as: FARXIGA  TAKE 1 TABLET BY MOUTH DAILY BEFORE BREAKFAST.   Dexcom G6 Transmitter Misc Use one every 3 month   ferrous sulfate  325 (65 FE) MG tablet Take 325 mg by mouth 2 (two) times daily.   Fish Oil 1000 MG Caps Take 1,000 mg by mouth daily.   fluticasone  50 MCG/ACT nasal spray Commonly known as: FLONASE  Place 1 spray into both nostrils daily as needed for allergies or rhinitis.   hydrALAZINE  100 MG tablet Commonly known as: APRESOLINE  TAKE 1 TABLET BY MOUTH 3 TIMES DAILY. What changed: how much to take   insulin  glargine 100 unit/mL Sopn Commonly known as: LANTUS  Inject 0-10 Units into the skin at bedtime as needed (if blood gluose is above 130 take 2 units 150 and above 10 units).   isosorbide  mononitrate 120 MG 24 hr tablet Commonly known as: IMDUR  Take 120 mg by mouth at bedtime.   Linzess  145 MCG Caps capsule Generic drug: linaclotide  TAKE 1 CAPSULE BY MOUTH DAILY BEFORE BREAKFAST.   loratadine  10 MG tablet Commonly known as: CLARITIN  Take 10 mg by mouth in the morning.   Multivitamin Adult Tabs Take 1 tablet by mouth daily with lunch.   nitroGLYCERIN  0.4 MG SL tablet Commonly known as: NITROSTAT  Place 1 tablet (0.4 mg total) under the tongue every 5 (five) minutes as needed for chest pain.   NovoLOG  FlexPen 100 UNIT/ML FlexPen Generic drug: insulin   aspart Inject 5 Units into the skin 3 (three) times daily with meals.   Ozempic  (1 MG/DOSE) 4 MG/3ML Sopn Generic drug: Semaglutide  (1 MG/DOSE) Inject 1 mg into the skin every Tuesday.   pregabalin  75 MG capsule Commonly known as: LYRICA  Take 75 mg by mouth 2 (two) times daily.   sertraline  50 MG tablet Commonly known as: ZOLOFT  Take 50 mg by mouth in the morning.   sucroferric oxyhydroxide 500 MG chewable tablet Commonly known as: VELPHORO  Chew 1 tablet (500 mg total) by mouth 3 (three) times daily with meals.   tiZANidine 4 MG tablet Commonly known as: ZANAFLEX Take 4 mg by mouth 2 (two) times daily as needed for muscle spasms.   traMADol  50 MG tablet Commonly known as: Ultram  Take 1 tablet (50 mg total) by mouth every 12 (twelve) hours as needed.   traZODone  50 MG tablet Commonly known as: DESYREL  Take 50 mg by mouth at bedtime.   TURMERIC CURCUMIN PO Take 1,200 mg by mouth daily at 6 (six) AM. 600 mg each Chew        Allergies  Allergen Reactions   Metformin And Related Nausea Only   Other     Clonidine  patch causes skin irritation    Hydrocodone Itching    Patient able to tolerate when taken with Benadryl.   Oxycodone -Acetaminophen  Itching  Patient able to tolerate when taken with Benadryl.    Consultations: Nephrology, ID  Procedures/Studies: CT CHEST ABDOMEN PELVIS WO CONTRAST Result Date: 10/14/2023 EXAM: CT CHEST, ABDOMEN AND PELVIS WITHOUT CONTRAST 10/14/2023 03:15:04 PM TECHNIQUE: CT of the chest, abdomen and pelvis was performed without the administration of intravenous contrast. Multiplanar reformatted images are provided for review. Automated exposure control, iterative reconstruction, and/or weight based adjustment of the mA/kV was utilized to reduce the radiation dose to as low as reasonably achievable. COMPARISON: 10/12/2023 CLINICAL HISTORY: Sepsis. FINDINGS: CHEST: MEDIASTINUM: Heart and pericardium are unremarkable. The central airways are  clear. Moderate 3-vessel coronary atherosclerosis. LYMPH NODES: No evidence of mediastinal, hilar or axillary lymphadenopathy. LUNGS AND PLEURA: Mild right lower lobe scarring/atelectasis. No focal consolidation or pulmonary edema. No evidence of pleural effusion or pneumothorax. ABDOMEN AND PELVIS: HEPATOBILIARY: The liver is unremarkable. Gallbladder demonstrates layering tiny gallstones (image 71). No biliary ductal dilatation. SPLEEN: No acute abnormality. PANCREAS: No acute abnormality. ADRENAL GLANDS: No acute abnormality. KIDNEYS, URETERS AND BLADDER: No stones in the kidneys or ureters. No evidence of hydronephrosis. No evidence of perinephric or periureteral stranding. Urinary bladder is unremarkable. GI AND BOWEL: Stomach demonstrates no acute abnormality. There is no evidence of bowel obstruction. No evidence of appendicitis. Status post hysterectomy. REPRODUCTIVE ORGANS: No acute abnormality. PERITONEUM AND RETROPERITONEUM: No evidence of ascites or free air. Atherosclerotic calcifications of the abdominal aorta and branch vessels. LYMPH NODES: No evidence of lymphadenopathy. BONES AND SOFT TISSUES: No acute osseous abnormality. No focal soft tissue abnormality. IMPRESSION: 1. No acute findings. 2. Layering tiny gallstones, without associated inflammatory changes. 3. Additional stable ancillary findings, as above. Electronically signed by: Zadie Herter MD 10/14/2023 07:05 PM EDT RP Workstation: [id number]   CT CHEST ABDOMEN PELVIS WO CONTRAST Result Date: 10/12/2023 CLINICAL DATA:  Sepsis, nausea, vomiting, unspecified abdominal pain. End-stage renal disease on hemodialysis. EXAM: CT CHEST, ABDOMEN AND PELVIS WITHOUT CONTRAST TECHNIQUE: Multidetector CT imaging of the chest, abdomen and pelvis was performed following the standard protocol without IV contrast. RADIATION DOSE REDUCTION: This exam was performed according to the departmental dose-optimization program which includes automated exposure  control, adjustment of the mA and/or kV according to patient size and/or use of iterative reconstruction technique. COMPARISON:  08/29/2021 FINDINGS: CT CHEST FINDINGS Cardiovascular: Extensive multi-vessel coronary artery calcification. Hypoattenuation of the cardiac blood pool in keeping with at least mild anemia. Global cardiac size within normal limits. Right internal jugular hemodialysis catheter tip seen within the superior right atrium. No pericardial effusion. Central pulmonary arteries are enlarged in keeping with changes of pulmonary arterial hypertension. Mild atherosclerotic calcification within the thoracic aorta. No aortic aneurysm. Mediastinum/Nodes: No enlarged mediastinal, hilar, or axillary lymph nodes. Thyroid  gland, trachea, and esophagus demonstrate no significant findings. Small hiatal hernia Lungs/Pleura: Bibasilar cylindrical bronchiectasis and volume loss, likely post inflammatory in nature. There is mosaic attenuation of the pulmonary parenchyma in keeping with air trapping related to small airways disease. There are multiple clustered subpleural pulmonary nodules along the right major fissure measuring up to 7 mm in mean diameter, indeterminate. Their multiplicity favors an inflammatory process, however, these are indeterminate. No pneumothorax or pleural effusion. Central airways are widely patent. Musculoskeletal: No acute bone abnormality. No lytic or blastic bone lesion. Osseous structures are age appropriate. CT ABDOMEN PELVIS FINDINGS Hepatobiliary: Cholelithiasis without superimposed pericholecystic inflammatory change. Liver unremarkable on this noncontrast examination. No intra or extrahepatic biliary ductal dilation. Pancreas: Unremarkable Spleen: Unremarkable Adrenals/Urinary Tract: Adrenal glands are unremarkable. Kidneys are normal, without renal calculi, focal  lesion, or hydronephrosis. Bladder is unremarkable. Stomach/Bowel: Stomach is within normal limits. Appendix appears  normal. No evidence of bowel wall thickening, distention, or inflammatory changes. Vascular/Lymphatic: Aortic atherosclerosis. No enlarged abdominal or pelvic lymph nodes. Reproductive: Status post hysterectomy. No adnexal masses. Other: No abdominal wall hernia or abnormality. No abdominopelvic ascites. Musculoskeletal: No acute or significant osseous findings. IMPRESSION: 1. Multiple clustered subpleural pulmonary nodules along the right major fissure measuring up to 7 mm in mean diameter. While these may be inflammatory in nature, they are indeterminate. Non-contrast chest CT at 3-6 months is recommended. If the nodules are stable at time of repeat CT, then future CT at 18-24 months (from today's scan) is considered optional for low-risk patients, but is recommended for high-risk patients. This recommendation follows the consensus statement: Guidelines for Management of Incidental Pulmonary Nodules Detected on CT Images: From the Fleischner Society 2017; Radiology 2017; 284:228-243. 2. Extensive multi-vessel coronary artery calcification. 3. Hypoattenuation of the cardiac blood pool in keeping with at least mild anemia. 4. Cholelithiasis. 5. Small hiatal hernia. 6. Mosaic attenuation of the pulmonary parenchyma in keeping with air trapping related to small airways disease. 7. Bibasilar cylindrical bronchiectasis and volume loss, likely post inflammatory in nature. 8. Enlarged central pulmonary arteries in keeping with changes of pulmonary arterial hypertension. Aortic Atherosclerosis (ICD10-I70.0). Electronically Signed   By: Worthy Heads M.D.   On: 10/12/2023 02:35   CT Head Wo Contrast Result Date: 10/12/2023 CLINICAL DATA:  ams.  Nausea, vomiting, altered mental status. EXAM: CT HEAD WITHOUT CONTRAST TECHNIQUE: Contiguous axial images were obtained from the base of the skull through the vertex without intravenous contrast. RADIATION DOSE REDUCTION: This exam was performed according to the departmental  dose-optimization program which includes automated exposure control, adjustment of the mA and/or kV according to patient size and/or use of iterative reconstruction technique. COMPARISON:  04/29/2021 FINDINGS: Brain: Normal anatomic configuration. Mild patchy periventricular white matter changes are present likely reflecting the sequela of small vessel ischemia. Stable focal cortical encephalomalacia within the right temporal lobe anteriorly. No abnormal intra or extra-axial mass lesion or fluid collection. No abnormal mass effect or midline shift. No evidence of acute intracranial hemorrhage or infarct. Ventricular size is normal. Cerebellum unremarkable. Vascular: No asymmetric hyperdense vasculature at the skull base. Skull: Status post right temporal craniotomy.  No acute fracture. Sinuses/Orbits: Paranasal sinuses are clear. Orbits are unremarkable. Other: Bilateral mastoidectomy has been performed. Middle ear cavities are clear. IMPRESSION: 1. No acute intracranial abnormality. 2. Status post right temporal craniotomy. Stable focal cortical encephalomalacia within the right temporal lobe anteriorly. 3. Mild patchy periventricular white matter changes likely reflecting the sequela of small vessel ischemia. Electronically Signed   By: Worthy Heads M.D.   On: 10/12/2023 02:16   DG Chest Portable 1 View Result Date: 10/11/2023 CLINICAL DATA:  Weakness and fevers. EXAM: PORTABLE CHEST 1 VIEW COMPARISON:  Portable chest 08/11/2023. FINDINGS: Right IJ dialysis catheter again terminates at the superior cavoatrial junction. There is mild cardiomegaly. Vascular prominence is again noted without overt edema. The lungs are clear of infiltrates. There is no substantial pleural effusion. The mediastinum is normally outlined. There is calcification in the transverse aorta. No new osseous abnormality. IMPRESSION: 1. No evidence of acute chest disease. 2. Mild cardiomegaly and vascular prominence without overt edema. 3.  Aortic atherosclerosis. Electronically Signed   By: Denman Fischer M.D.   On: 10/11/2023 22:12     Subjective: No acute issues or events overnight denies nausea vomit diarrhea constipation any  fevers chills or chest pain   Discharge Exam: Vitals:   10/15/23 0413 10/15/23 0753  BP: (!) 135/54 (!) 124/55  Pulse: 71 72  Resp: 16 18  Temp: 99.3 F (37.4 C) 98.7 F (37.1 C)  SpO2: 96% 96%   Vitals:   10/14/23 2300 10/15/23 0111 10/15/23 0413 10/15/23 0753  BP:  (!) 123/55 (!) 135/54 (!) 124/55  Pulse:  72 71 72  Resp:  16 16 18   Temp:  98.5 F (36.9 C) 99.3 F (37.4 C) 98.7 F (37.1 C)  TempSrc:  Oral Oral Oral  SpO2: 92% 96% 96% 96%  Weight:      Height:        General: Pt is alert, awake, not in acute distress Cardiovascular: RRR, S1/S2 +, no rubs, no gallops Respiratory: CTA bilaterally, no wheezing, no rhonchi Abdominal: Soft, NT, ND, bowel sounds + Extremities: no edema, no cyanosis    The results of significant diagnostics from this hospitalization (including imaging, microbiology, ancillary and laboratory) are listed below for reference.     Microbiology: Recent Results (from the past 240 hours)  Resp panel by RT-PCR (RSV, Flu A&B, Covid) Anterior Nasal Swab     Status: None   Collection Time: 10/11/23  9:25 PM   Specimen: Anterior Nasal Swab  Result Value Ref Range Status   SARS Coronavirus 2 by RT PCR NEGATIVE NEGATIVE Final   Influenza A by PCR NEGATIVE NEGATIVE Final   Influenza B by PCR NEGATIVE NEGATIVE Final    Comment: (NOTE) The Xpert Xpress SARS-CoV-2/FLU/RSV plus assay is intended as an aid in the diagnosis of influenza from Nasopharyngeal swab specimens and should not be used as a sole basis for treatment. Nasal washings and aspirates are unacceptable for Xpert Xpress SARS-CoV-2/FLU/RSV testing.  Fact Sheet for Patients: BloggerCourse.com  Fact Sheet for Healthcare  Providers: SeriousBroker.it  This test is not yet approved or cleared by the [place]  FDA and has been authorized for detection and/or diagnosis of SARS-CoV-2 by FDA under an Emergency Use Authorization (EUA). This EUA will remain in effect (meaning this test can be used) for the duration of the COVID-19 declaration under Section 564(b)(1) of the Act, 21 U.S.C. section 360bbb-3(b)(1), unless the authorization is terminated or revoked.     Resp Syncytial Virus by PCR NEGATIVE NEGATIVE Final    Comment: (NOTE) Fact Sheet for Patients: BloggerCourse.com  Fact Sheet for Healthcare Providers: SeriousBroker.it  This test is not yet approved or cleared by the [place]  FDA and has been authorized for detection and/or diagnosis of SARS-CoV-2 by FDA under an Emergency Use Authorization (EUA). This EUA will remain in effect (meaning this test can be used) for the duration of the COVID-19 declaration under Section 564(b)(1) of the Act, 21 U.S.C. section 360bbb-3(b)(1), unless the authorization is terminated or revoked.  Performed at [facility], [address], [place], [place] [address]   Respiratory (~20 pathogens) panel by PCR     Status: None   Collection Time: 10/12/23 10:00 AM   Specimen: Nasopharyngeal Swab; Respiratory  Result Value Ref Range Status   Adenovirus NOT DETECTED NOT DETECTED Final   Coronavirus 229E NOT DETECTED NOT DETECTED Final    Comment: (NOTE) The Coronavirus on the Respiratory Panel, DOES NOT test for the novel  Coronavirus (2019 nCoV)    Coronavirus HKU1 NOT DETECTED NOT DETECTED Final   Coronavirus NL63 NOT DETECTED NOT DETECTED Final   Coronavirus OC43 NOT DETECTED NOT DETECTED Final   Metapneumovirus NOT  DETECTED NOT DETECTED Final   Rhinovirus / Enterovirus NOT DETECTED NOT DETECTED Final   Influenza A NOT DETECTED NOT DETECTED Final   Influenza B NOT DETECTED  NOT DETECTED Final   Parainfluenza Virus 1 NOT DETECTED NOT DETECTED Final   Parainfluenza Virus 2 NOT DETECTED NOT DETECTED Final   Parainfluenza Virus 3 NOT DETECTED NOT DETECTED Final   Parainfluenza Virus 4 NOT DETECTED NOT DETECTED Final   Respiratory Syncytial Virus NOT DETECTED NOT DETECTED Final   Bordetella pertussis NOT DETECTED NOT DETECTED Final   Bordetella Parapertussis NOT DETECTED NOT DETECTED Final   Chlamydophila pneumoniae NOT DETECTED NOT DETECTED Final   Mycoplasma pneumoniae NOT DETECTED NOT DETECTED Final    Comment: Performed at [facility], [address] [place], [place] [address]  Culture, blood (Routine X 2) w Reflex to ID Panel     Status: None (Preliminary result)   Collection Time: 10/12/23 10:58 AM   Specimen: BLOOD RIGHT ARM  Result Value Ref Range Status   Specimen Description BLOOD RIGHT ARM  Final   Special Requests   Final    BOTTLES DRAWN AEROBIC AND ANAEROBIC Blood Culture results may not be optimal due to an inadequate volume of blood received in culture bottles   Culture   Final    NO GROWTH 3 DAYS Performed at [facility], [address] [place], [place] [address]    Report Status PENDING  Incomplete  Culture, blood (Routine X 2) w Reflex to ID Panel     Status: None (Preliminary result)   Collection Time: 10/12/23 10:58 AM   Specimen: BLOOD RIGHT HAND  Result Value Ref Range Status   Specimen Description BLOOD RIGHT HAND  Final   Special Requests   Final    BOTTLES DRAWN AEROBIC AND ANAEROBIC Blood Culture results may not be optimal due to an inadequate volume of blood received in culture bottles   Culture   Final    NO GROWTH 3 DAYS Performed at [facility], [address], [place], [place] [address]    Report Status PENDING  Incomplete  Urine Culture (for pregnant, neutropenic or urologic patients or patients with an indwelling urinary catheter)     Status: Abnormal   Collection Time: 10/12/23  2:51 PM    Specimen: Urine, Clean Catch  Result Value Ref Range Status   Specimen Description URINE, CLEAN CATCH  Final   Special Requests   Final    NONE Performed at [facility], [address], [place], [place] [address]    Culture MULTIPLE SPECIES PRESENT, SUGGEST RECOLLECTION (A)  Final   Report Status 10/13/2023 FINAL  Final  Cath Tip Culture     Status: Abnormal   Collection Time: 10/13/23  5:34 PM   Specimen: Hemodialysis Catheter; Other  Result Value Ref Range Status   Specimen Description HEMODIALYSIS CATHETER  Final   Special Requests   Final    NONE Performed at [facility], [address] [place], [place] [address]    Culture ENTEROBACTER CLOACAE (A)  Final   Report Status 10/15/2023 FINAL  Final   Organism ID, Bacteria ENTEROBACTER CLOACAE  Final      Susceptibility   Enterobacter cloacae - MIC*    CEFEPIME  <=0.12 SENSITIVE Sensitive     CEFTAZIDIME <=1 SENSITIVE Sensitive     CIPROFLOXACIN <=0.25 SENSITIVE Sensitive     GENTAMICIN <=1 SENSITIVE Sensitive     IMIPENEM 1 SENSITIVE Sensitive  TRIMETH/SULFA <=20 SENSITIVE Sensitive     PIP/TAZO <=4 SENSITIVE Sensitive ug/mL    * ENTEROBACTER CLOACAE     Labs: BNP (last 3 results) Recent Labs    02/02/23 1228  BNP 41.6   Basic Metabolic Panel: Recent Labs  Lab 10/11/23 2215 10/13/23 0548 10/13/23 0552 10/14/23 0514 10/15/23 0402  NA 137 138  --  137 136  K 3.7 3.1*  --  3.8 3.6  CL 94* 96*  --  92* 91*  CO2 25 25  --  25 28  GLUCOSE 194* 153*  --  215* 204*  BUN 77* 45*  --  72* 54*  CREATININE 7.32* 5.44*  --  7.58* 5.86*  CALCIUM  8.8* 8.3*  --  7.9* 8.4*  PHOS  --   --  6.5*  --   --    Liver Function Tests: Recent Labs  Lab 10/11/23 2215 10/13/23 0548  AST 42* 62*  ALT 14 15  ALKPHOS 37* 34*  BILITOT 0.6 1.0  PROT 6.9 6.6  ALBUMIN 3.4* 3.0*   No results for input(s): "LIPASE", "AMYLASE" in the last 168 hours. No results for input(s): "AMMONIA" in the last 168  hours. CBC: Recent Labs  Lab 10/11/23 2215 10/14/23 0514 10/15/23 0402  WBC 9.8 12.9* 10.3  NEUTROABS 9.0*  --   --   HGB 10.9* 10.1* 11.0*  HCT 34.0* 30.6* 33.8*  MCV 86.3 84.3 84.1  PLT 170 165 194   Cardiac Enzymes: No results for input(s): "CKTOTAL", "CKMB", "CKMBINDEX", "TROPONINI" in the last 168 hours. BNP: Invalid input(s): "POCBNP" CBG: Recent Labs  Lab 10/14/23 1655 10/14/23 2146 10/15/23 0109 10/15/23 0752 10/15/23 1201  GLUCAP 158* 203* 230* 145* 250*   D-Dimer No results for input(s): "DDIMER" in the last 72 hours. Hgb A1c No results for input(s): "HGBA1C" in the last 72 hours.  Lipid Profile No results for input(s): "CHOL", "HDL", "LDLCALC", "TRIG", "CHOLHDL", "LDLDIRECT" in the last 72 hours. Thyroid  function studies No results for input(s): "TSH", "T4TOTAL", "T3FREE", "THYROIDAB" in the last 72 hours.  Invalid input(s): "FREET3" Anemia work up No results for input(s): "VITAMINB12", "FOLATE", "FERRITIN", "TIBC", "IRON", "RETICCTPCT" in the last 72 hours. Urinalysis    Component Value Date/Time   COLORURINE YELLOW 10/12/2023 0735   APPEARANCEUR HAZY (A) 10/12/2023 0735   LABSPEC 1.013 10/12/2023 0735   PHURINE 5.0 10/12/2023 0735   GLUCOSEU 150 (A) 10/12/2023 0735   HGBUR SMALL (A) 10/12/2023 0735   BILIRUBINUR NEGATIVE 10/12/2023 0735   KETONESUR NEGATIVE 10/12/2023 0735   PROTEINUR 100 (A) 10/12/2023 0735   NITRITE NEGATIVE 10/12/2023 0735   LEUKOCYTESUR SMALL (A) 10/12/2023 0735   Sepsis Labs Recent Labs  Lab 10/11/23 2215 10/14/23 0514 10/15/23 0402  WBC 9.8 12.9* 10.3   Microbiology Recent Results (from the past 240 hours)  Resp panel by RT-PCR (RSV, Flu A&B, Covid) Anterior Nasal Swab     Status: None   Collection Time: 10/11/23  9:25 PM   Specimen: Anterior Nasal Swab  Result Value Ref Range Status   SARS Coronavirus 2 by RT PCR NEGATIVE NEGATIVE Final   Influenza A by PCR NEGATIVE NEGATIVE Final   Influenza B by PCR  NEGATIVE NEGATIVE Final    Comment: (NOTE) The Xpert Xpress SARS-CoV-2/FLU/RSV plus assay is intended as an aid in the diagnosis of influenza from Nasopharyngeal swab specimens and should not be used as a sole basis for treatment. Nasal washings and aspirates are unacceptable for Xpert Xpress SARS-CoV-2/FLU/RSV testing.  Fact Sheet for  Patients: BloggerCourse.com  Fact Sheet for Healthcare Providers: SeriousBroker.it  This test is not yet approved or cleared by the [place]  FDA and has been authorized for detection and/or diagnosis of SARS-CoV-2 by FDA under an Emergency Use Authorization (EUA). This EUA will remain in effect (meaning this test can be used) for the duration of the COVID-19 declaration under Section 564(b)(1) of the Act, 21 U.S.C. section 360bbb-3(b)(1), unless the authorization is terminated or revoked.     Resp Syncytial Virus by PCR NEGATIVE NEGATIVE Final    Comment: (NOTE) Fact Sheet for Patients: BloggerCourse.com  Fact Sheet for Healthcare Providers: SeriousBroker.it  This test is not yet approved or cleared by the [place]  FDA and has been authorized for detection and/or diagnosis of SARS-CoV-2 by FDA under an Emergency Use Authorization (EUA). This EUA will remain in effect (meaning this test can be used) for the duration of the COVID-19 declaration under Section 564(b)(1) of the Act, 21 U.S.C. section 360bbb-3(b)(1), unless the authorization is terminated or revoked.  Performed at [facility], [address], [place], [place] [address]   Respiratory (~20 pathogens) panel by PCR     Status: None   Collection Time: 10/12/23 10:00 AM   Specimen: Nasopharyngeal Swab; Respiratory  Result Value Ref Range Status   Adenovirus NOT DETECTED NOT DETECTED Final   Coronavirus 229E NOT DETECTED NOT DETECTED Final    Comment: (NOTE) The  Coronavirus on the Respiratory Panel, DOES NOT test for the novel  Coronavirus (2019 nCoV)    Coronavirus HKU1 NOT DETECTED NOT DETECTED Final   Coronavirus NL63 NOT DETECTED NOT DETECTED Final   Coronavirus OC43 NOT DETECTED NOT DETECTED Final   Metapneumovirus NOT DETECTED NOT DETECTED Final   Rhinovirus / Enterovirus NOT DETECTED NOT DETECTED Final   Influenza A NOT DETECTED NOT DETECTED Final   Influenza B NOT DETECTED NOT DETECTED Final   Parainfluenza Virus 1 NOT DETECTED NOT DETECTED Final   Parainfluenza Virus 2 NOT DETECTED NOT DETECTED Final   Parainfluenza Virus 3 NOT DETECTED NOT DETECTED Final   Parainfluenza Virus 4 NOT DETECTED NOT DETECTED Final   Respiratory Syncytial Virus NOT DETECTED NOT DETECTED Final   Bordetella pertussis NOT DETECTED NOT DETECTED Final   Bordetella Parapertussis NOT DETECTED NOT DETECTED Final   Chlamydophila pneumoniae NOT DETECTED NOT DETECTED Final   Mycoplasma pneumoniae NOT DETECTED NOT DETECTED Final    Comment: Performed at [facility], [address] [place], [place] [address]  Culture, blood (Routine X 2) w Reflex to ID Panel     Status: None (Preliminary result)   Collection Time: 10/12/23 10:58 AM   Specimen: BLOOD RIGHT ARM  Result Value Ref Range Status   Specimen Description BLOOD RIGHT ARM  Final   Special Requests   Final    BOTTLES DRAWN AEROBIC AND ANAEROBIC Blood Culture results may not be optimal due to an inadequate volume of blood received in culture bottles   Culture   Final    NO GROWTH 3 DAYS Performed at [facility], [address], [place], [place] [address]    Report Status PENDING  Incomplete  Culture, blood (Routine X 2) w Reflex to ID Panel     Status: None (Preliminary result)   Collection Time: 10/12/23 10:58 AM   Specimen: BLOOD RIGHT HAND  Result Value Ref Range Status   Specimen Description BLOOD RIGHT HAND  Final   Special Requests   Final    BOTTLES DRAWN  AEROBIC AND ANAEROBIC Blood  Culture results may not be optimal due to an inadequate volume of blood received in culture bottles   Culture   Final    NO GROWTH 3 DAYS Performed at [facility], [address], [place], [place] [address]    Report Status PENDING  Incomplete  Urine Culture (for pregnant, neutropenic or urologic patients or patients with an indwelling urinary catheter)     Status: Abnormal   Collection Time: 10/12/23  2:51 PM   Specimen: Urine, Clean Catch  Result Value Ref Range Status   Specimen Description URINE, CLEAN CATCH  Final   Special Requests   Final    NONE Performed at [facility], [address], [place], [place] [address]    Culture MULTIPLE SPECIES PRESENT, SUGGEST RECOLLECTION (A)  Final   Report Status 10/13/2023 FINAL  Final  Cath Tip Culture     Status: Abnormal   Collection Time: 10/13/23  5:34 PM   Specimen: Hemodialysis Catheter; Other  Result Value Ref Range Status   Specimen Description HEMODIALYSIS CATHETER  Final   Special Requests   Final    NONE Performed at [facility], [address] [place], [place] [address]    Culture ENTEROBACTER CLOACAE (A)  Final   Report Status 10/15/2023 FINAL  Final   Organism ID, Bacteria ENTEROBACTER CLOACAE  Final      Susceptibility   Enterobacter cloacae - MIC*    CEFEPIME  <=0.12 SENSITIVE Sensitive     CEFTAZIDIME <=1 SENSITIVE Sensitive     CIPROFLOXACIN <=0.25 SENSITIVE Sensitive     GENTAMICIN <=1 SENSITIVE Sensitive     IMIPENEM 1 SENSITIVE Sensitive     TRIMETH/SULFA <=20 SENSITIVE Sensitive     PIP/TAZO <=4 SENSITIVE Sensitive ug/mL    * ENTEROBACTER CLOACAE     Time coordinating discharge: Over 30 minutes  SIGNED:   Haydee Lipa, DO [facility] 10/15/2023, 3:36 PM Pager   If 7PM-7AM, please contact night-coverage [email or web address]

## 2023-10-15 NOTE — Progress Notes (Signed)
 Case discussed with attending. Pt for likely d/c this afternoon. Contacted [facility] to advise clinic of pt's d/c this afternoon and that pt should resume care tomorrow. Clinic manager advised pt will need iv abx with HD per pharmacist and that renal PA will send orders. Nephrologist and renal PA made aware of attending plans.   Lauraine Polite Renal Navigator [phone]

## 2023-10-17 ENCOUNTER — Telehealth: Payer: Self-pay | Admitting: Physician Assistant

## 2023-10-17 LAB — CULTURE, BLOOD (ROUTINE X 2)
Culture: NO GROWTH
Culture: NO GROWTH

## 2023-10-17 NOTE — Telephone Encounter (Signed)
 Transition of Care - Initial Contact after Hospitalization  Date of discharge:  10/15/23 Date of contact: 10/17/23  Method: Phone Spoke to: Patient  Patient contacted to discuss transition of care from recent inpatient hospitalization. Patient was admitted to [facility] from 10/11/23 to 10/15/23 with discharge diagnosis of: acute febrile illness. TDC removed, receiving 1 more dose on antibiotics on Tuesday. No other med changes. PharmD reported velphoro  was not going to be covered by insurance, advised pt we will follow up on this at dialysis and switch binders if needed.   The discharge medication list was reviewed. Patient understands the changes and has no concerns.   Patient will return to his/her outpatient HD unit on: 10/19/23  No other concerns at this time.  Ramona Burner, PA-C 10/17/2023, 10:00 AM  [facility] Pager: [phone]

## 2023-10-25 DIAGNOSIS — Z419 Encounter for procedure for purposes other than remedying health state, unspecified: Secondary | ICD-10-CM | POA: Diagnosis not present

## 2023-11-13 DIAGNOSIS — N186 End stage renal disease: Secondary | ICD-10-CM | POA: Diagnosis not present

## 2023-11-13 DIAGNOSIS — Z992 Dependence on renal dialysis: Secondary | ICD-10-CM | POA: Diagnosis not present

## 2023-11-13 DIAGNOSIS — E1122 Type 2 diabetes mellitus with diabetic chronic kidney disease: Secondary | ICD-10-CM | POA: Diagnosis not present

## 2023-11-16 DIAGNOSIS — I509 Heart failure, unspecified: Secondary | ICD-10-CM | POA: Diagnosis not present

## 2023-11-16 DIAGNOSIS — I10 Essential (primary) hypertension: Secondary | ICD-10-CM | POA: Diagnosis not present

## 2023-11-16 DIAGNOSIS — E1122 Type 2 diabetes mellitus with diabetic chronic kidney disease: Secondary | ICD-10-CM | POA: Diagnosis not present

## 2023-11-16 DIAGNOSIS — N186 End stage renal disease: Secondary | ICD-10-CM | POA: Diagnosis not present

## 2023-11-25 DIAGNOSIS — Z419 Encounter for procedure for purposes other than remedying health state, unspecified: Secondary | ICD-10-CM | POA: Diagnosis not present

## 2023-11-26 ENCOUNTER — Telehealth: Payer: Self-pay | Admitting: Vascular Surgery

## 2023-12-01 DIAGNOSIS — Z992 Dependence on renal dialysis: Secondary | ICD-10-CM | POA: Diagnosis not present

## 2023-12-01 DIAGNOSIS — E1122 Type 2 diabetes mellitus with diabetic chronic kidney disease: Secondary | ICD-10-CM | POA: Diagnosis not present

## 2023-12-01 DIAGNOSIS — N186 End stage renal disease: Secondary | ICD-10-CM | POA: Diagnosis not present

## 2023-12-03 NOTE — Telephone Encounter (Signed)
 Pt appt scheduled

## 2023-12-08 ENCOUNTER — Ambulatory Visit: Admitting: Vascular Surgery

## 2023-12-13 DIAGNOSIS — Z992 Dependence on renal dialysis: Secondary | ICD-10-CM | POA: Diagnosis not present

## 2023-12-13 DIAGNOSIS — E1122 Type 2 diabetes mellitus with diabetic chronic kidney disease: Secondary | ICD-10-CM | POA: Diagnosis not present

## 2023-12-13 DIAGNOSIS — N186 End stage renal disease: Secondary | ICD-10-CM | POA: Diagnosis not present

## 2023-12-14 DIAGNOSIS — I10 Essential (primary) hypertension: Secondary | ICD-10-CM | POA: Diagnosis not present

## 2023-12-14 DIAGNOSIS — I509 Heart failure, unspecified: Secondary | ICD-10-CM | POA: Diagnosis not present

## 2023-12-14 DIAGNOSIS — N186 End stage renal disease: Secondary | ICD-10-CM | POA: Diagnosis not present

## 2023-12-14 DIAGNOSIS — E1122 Type 2 diabetes mellitus with diabetic chronic kidney disease: Secondary | ICD-10-CM | POA: Diagnosis not present

## 2023-12-15 ENCOUNTER — Other Ambulatory Visit: Payer: Self-pay

## 2023-12-15 ENCOUNTER — Encounter (HOSPITAL_COMMUNITY)
Admission: RE | Disposition: A | Discharge status: Home / Self Care | Payer: Self-pay | Source: Home / Self Care | Attending: Vascular Surgery

## 2023-12-15 ENCOUNTER — Ambulatory Visit (HOSPITAL_COMMUNITY)
Admission: RE | Admit: 2023-12-15 | Discharge: 2023-12-15 | Disposition: A | Discharge status: Home / Self Care | Attending: Vascular Surgery | Admitting: Vascular Surgery

## 2023-12-15 DIAGNOSIS — Z992 Dependence on renal dialysis: Secondary | ICD-10-CM

## 2023-12-15 DIAGNOSIS — N186 End stage renal disease: Secondary | ICD-10-CM

## 2023-12-15 DIAGNOSIS — I5032 Chronic diastolic (congestive) heart failure: Secondary | ICD-10-CM | POA: Insufficient documentation

## 2023-12-15 DIAGNOSIS — T82858A Stenosis of vascular prosthetic devices, implants and grafts, initial encounter: Secondary | ICD-10-CM

## 2023-12-15 DIAGNOSIS — I132 Hypertensive heart and chronic kidney disease with heart failure and with stage 5 chronic kidney disease, or end stage renal disease: Secondary | ICD-10-CM | POA: Diagnosis not present

## 2023-12-15 DIAGNOSIS — E1122 Type 2 diabetes mellitus with diabetic chronic kidney disease: Secondary | ICD-10-CM | POA: Insufficient documentation

## 2023-12-15 DIAGNOSIS — Y832 Surgical operation with anastomosis, bypass or graft as the cause of abnormal reaction of the patient, or of later complication, without mention of misadventure at the time of the procedure: Secondary | ICD-10-CM | POA: Insufficient documentation

## 2023-12-15 HISTORY — PX: A/V SHUNT INTERVENTION: CATH118220

## 2023-12-15 SURGERY — A/V SHUNT INTERVENTION
Anesthesia: LOCAL

## 2023-12-15 MED ORDER — LIDOCAINE HCL (PF) 1 % IJ SOLN
INTRAMUSCULAR | Status: DC | PRN
  Administered 2023-12-15: 2 mL

## 2023-12-15 MED ORDER — IODIXANOL 320 MG/ML IV SOLN
INTRAVENOUS | Status: DC | PRN
  Administered 2023-12-15: 20 mL via INTRAVENOUS

## 2023-12-15 MED ORDER — HEPARIN (PORCINE) IN NACL 1000-0.9 UT/500ML-% IV SOLN
INTRAVENOUS | Status: DC | PRN
  Administered 2023-12-15: 500 mL

## 2023-12-15 MED ORDER — LIDOCAINE HCL (PF) 1 % IJ SOLN
INTRAMUSCULAR | Status: AC
Start: 2023-12-15 — End: 2023-12-15
  Filled 2023-12-15: qty 30

## 2023-12-15 SURGICAL SUPPLY — 10 items
BALLOON MUSTANG 7.0X40 75 (BALLOONS) IMPLANT
KIT ENCORE 26 ADVANTAGE (KITS) IMPLANT
KIT MICROPUNCTURE NIT STIFF (SHEATH) IMPLANT
KIT PV (KITS) ×1 IMPLANT
SHEATH PINNACLE R/O II 6F 4CM (SHEATH) IMPLANT
SHEATH PROBE COVER 6X72 (BAG) IMPLANT
STOPCOCK MORSE 400PSI 3WAY (MISCELLANEOUS) IMPLANT
TRAY PV CATH (CUSTOM PROCEDURE TRAY) ×1 IMPLANT
TUBING CIL FLEX 10 FLL-RA (TUBING) IMPLANT
WIRE BENTSON .035X145CM (WIRE) IMPLANT

## 2023-12-15 NOTE — H&P (Signed)
 HD ACCESS CENTER H&P   Patient ID: Maria Hudson, female   DOB: Nov 23, 1960, 63 y.o.   MRN: [id number]  Subjective:     HPI Maria Hudson is a 63 y.o. female with ESRD presenting to the HD access center for intervention.  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   Stroke [facility])    Family History  Problem Relation Age of Onset   Hypertension Mother    Stroke Mother    Diabetes Mother    Other Mother        enlarged heart   Hypertension Father    Hypertension Sister    Diabetes Sister    CAD Sister        enlarged heart   Kidney disease Sister    Diabetes Paternal Grandmother    Hypertension Daughter    Diabetes Daughter    Hypertension Son    Diabetes Son    Breast cancer Paternal Aunt    Past Surgical History:  Procedure Laterality Date   A/V FISTULAGRAM Left 08/09/2023   Procedure: A/V Fistulagram;  Surgeon: Sheree Penne Bruckner, MD;  Location: [facility] INVASIVE CV LAB;  Service: Cardiovascular;  Laterality: Left;   ABDOMINAL HYSTERECTOMY     AV FISTULA PLACEMENT Left 12/09/2021   Procedure: LEFT ARTERIOVENOUS (AV) FISTULA CREATION;  Surgeon: Sheree Penne Bruckner, MD;  Location: [facility] OR;  Service: Vascular;  Laterality: Left;   BIOPSY  09/08/2021   Procedure: BIOPSY;  Surgeon: Legrand Victory LITTIE DOUGLAS, MD;  Location: [facility] ENDOSCOPY;  Service: Gastroenterology;;   CESAREAN  SECTION     x 1   ESOPHAGOGASTRODUODENOSCOPY (EGD) WITH PROPOFOL  N/A 09/08/2021   Procedure: ESOPHAGOGASTRODUODENOSCOPY (EGD) WITH PROPOFOL ;  Surgeon: Legrand Victory LITTIE DOUGLAS, MD;  Location: [facility] ENDOSCOPY;  Service: Gastroenterology;  Laterality: N/A;  chest pain, epigastric pain   FISTULA SUPERFICIALIZATION Left 08/11/2023   Procedure: FISTULA SUPERFICIALIZATION LEFT UPPER ARM;  Surgeon: Magda Debby SAILOR, MD;  Location: [facility] OR;  Service: Vascular;  Laterality: Left;   INNER EAR SURGERY Bilateral    INSERTION OF DIALYSIS CATHETER Right 08/11/2023   Procedure: INSERTION OF DIALYSIS CATHETER;  Surgeon: Magda Debby SAILOR, MD;  Location[facility] OR;  Service: Vascular;  Laterality: Right;   RIGHT HEART CATH N/A 05/14/2021   Procedure: RIGHT HEART CATH;  Surgeon: Rolan Ezra GORMAN, MD;  Location: [facility] INVASIVE CV LAB;  Service: Cardiovascular;  Laterality: N/A;   TONSILLECTOMY     age 59    Short Social History:  Social History   Tobacco Use   Smoking status: Never    Passive exposure: Never   Smokeless tobacco: Never  Substance Use Topics   Alcohol use: Not Currently    Allergies  Allergen Reactions   Metformin And Related Nausea Only   Other  Clonidine  patch causes skin irritation    Hydrocodone Itching    Patient able to tolerate when taken with Benadryl.   Oxycodone -Acetaminophen  Itching    Patient able to tolerate when taken with Benadryl.    No current facility-administered medications for this encounter.   Facility-Administered Medications Ordered in Other Encounters  Medication Dose Route Frequency Provider Last Rate Last Admin   technetium pyrophosphate Tc 9m injection 21.2 millicurie  21.2 millicurie Intravenous Once Hilty, Vinie BROCKS, MD        REVIEW OF SYSTEMS All other systems were reviewed and are negative     Objective:   Objective   Vitals:   12/15/23 0711  BP: (!) 143/82  Pulse: 66  Resp: 12  SpO2: 98%   There is no height or weight on file to calculate  BMI.  Physical Exam General: no acute distress Cardiac: hemodynamically stable Extremities: Left arm AVF with palpable thrill and pulse  Data: Findings:  +--------------------+----------+-----------------+--------+  AVF                PSV (cm/s)Flow Vol (mL/min)Comments  +--------------------+----------+-----------------+--------+  Native artery inflow   298                               +--------------------+----------+-----------------+--------+  AVF Anastomosis        435          2126                 +--------------------+----------+-----------------+--------+     +------------+----------+------------+----------+--------------------------  ----+  OUTFLOW VEINPSV (cm/s)  Diameter  Depth (cm)           Describe                                        (cm)                                               +------------+----------+------------+----------+--------------------------  ----+  Prox UA         79        0.89       1.29                                    +------------+----------+------------+----------+--------------------------  ----+  Mid UA         157        0.90       0.27                                    +------------+----------+------------+----------+--------------------------  ----+  Dist UA        589        0.48       0.27       stenotic and change  in  Diameter              +------------+----------+------------+----------+--------------------------  ----+  AC Fossa       163        1.21       0.14                                    +------------+----------+------------+----------+--------------------------  ----+      Assessment/Plan:   Maria Hudson is a 64 y.o. female with ESRD presenting for fistulagram.  Having issues with high pressures on HD. Last HD session yesterday. Reviewed risks and benefits of fistulagram and patient  agreed to proceed.   Norman Serve, MD [facility]

## 2023-12-15 NOTE — Op Note (Signed)
    Patient name: Maria Hudson MRN: [id number] DOB: 03/14/1961 Sex: female  12/15/2023 Pre-operative Diagnosis: ESRD on HD, high venous pressures with sessions Post-operative diagnosis:  Same Surgeon:  Norman GORMAN Serve, MD Procedure Performed:  Ultrasound-guided access of left arm aVF Fistulogram and central venogram Balloon angioplasty of left arm aVF, 7 mm x 40 mm Mustang  Indications: [name] is a 63 year old female with ESRD on HD who presented to the HD access center for intervention.  She has been having issues with high pressures and pulsatility.  Her last HD session was yesterday.  Risk benefits of fistulogram or events were reviewed, she expressed understanding and elected to proceed.  Findings:  Widely patent central venous system.  Widely patent cephalic arch and axillary vein.  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  The anastomosis appears widely patent.   Procedure:  The patient was identified in the holding area and taken to the cath lab  The patient was then placed supine on the table and prepped and draped in the usual sterile fashion.  A time out was called.  Ultrasound was used to evaluate the left arm AV access. This was accessed under u/s guidance. An 018 wire was advanced without resistance, a micropuncture sheath was placed and fistulagram obtained which demonstrated the above findings.  This access was then upsized to a 72F short sheath over a guidewire. The stenosis was then treated with a 7 mm x 40 mm Mustang balloon with an excellent response and less than  20% residual stenosis.  Contrast: 20 cc   Impression: Appropriate response to balloon angioplasty of the mid segment fistula stenosis.  Could upsized to at least a 9 mm balloon at next intervention.   Norman GORMAN Serve MD [facility] [place] Office: [phone]

## 2023-12-16 ENCOUNTER — Encounter (HOSPITAL_COMMUNITY): Payer: Self-pay | Admitting: Vascular Surgery

## 2023-12-18 ENCOUNTER — Encounter (HOSPITAL_COMMUNITY): Payer: Self-pay

## 2023-12-18 ENCOUNTER — Other Ambulatory Visit: Payer: Self-pay

## 2023-12-18 ENCOUNTER — Other Ambulatory Visit: Payer: Self-pay | Admitting: Cardiology

## 2023-12-18 ENCOUNTER — Emergency Department (HOSPITAL_COMMUNITY)
Admission: EM | Admit: 2023-12-18 | Discharge: 2023-12-18 | Disposition: A | Discharge status: Home / Self Care | Attending: Emergency Medicine | Admitting: Emergency Medicine

## 2023-12-18 ENCOUNTER — Emergency Department (HOSPITAL_COMMUNITY)

## 2023-12-18 DIAGNOSIS — I509 Heart failure, unspecified: Secondary | ICD-10-CM | POA: Insufficient documentation

## 2023-12-18 DIAGNOSIS — M79602 Pain in left arm: Secondary | ICD-10-CM

## 2023-12-18 DIAGNOSIS — I132 Hypertensive heart and chronic kidney disease with heart failure and with stage 5 chronic kidney disease, or end stage renal disease: Secondary | ICD-10-CM | POA: Diagnosis not present

## 2023-12-18 DIAGNOSIS — Z794 Long term (current) use of insulin: Secondary | ICD-10-CM | POA: Insufficient documentation

## 2023-12-18 DIAGNOSIS — R110 Nausea: Secondary | ICD-10-CM | POA: Insufficient documentation

## 2023-12-18 DIAGNOSIS — R42 Dizziness and giddiness: Secondary | ICD-10-CM | POA: Diagnosis not present

## 2023-12-18 DIAGNOSIS — Z7982 Long term (current) use of aspirin: Secondary | ICD-10-CM | POA: Diagnosis not present

## 2023-12-18 DIAGNOSIS — N186 End stage renal disease: Secondary | ICD-10-CM | POA: Insufficient documentation

## 2023-12-18 DIAGNOSIS — R072 Precordial pain: Secondary | ICD-10-CM

## 2023-12-18 DIAGNOSIS — Z992 Dependence on renal dialysis: Secondary | ICD-10-CM | POA: Insufficient documentation

## 2023-12-18 DIAGNOSIS — I2510 Atherosclerotic heart disease of native coronary artery without angina pectoris: Secondary | ICD-10-CM

## 2023-12-18 DIAGNOSIS — Z79899 Other long term (current) drug therapy: Secondary | ICD-10-CM | POA: Insufficient documentation

## 2023-12-18 DIAGNOSIS — M542 Cervicalgia: Secondary | ICD-10-CM | POA: Insufficient documentation

## 2023-12-18 DIAGNOSIS — E1122 Type 2 diabetes mellitus with diabetic chronic kidney disease: Secondary | ICD-10-CM | POA: Insufficient documentation

## 2023-12-18 DIAGNOSIS — I517 Cardiomegaly: Secondary | ICD-10-CM | POA: Diagnosis not present

## 2023-12-18 DIAGNOSIS — M79603 Pain in arm, unspecified: Secondary | ICD-10-CM | POA: Diagnosis not present

## 2023-12-18 DIAGNOSIS — R079 Chest pain, unspecified: Secondary | ICD-10-CM

## 2023-12-18 DIAGNOSIS — R0602 Shortness of breath: Secondary | ICD-10-CM | POA: Diagnosis not present

## 2023-12-18 DIAGNOSIS — R0789 Other chest pain: Secondary | ICD-10-CM | POA: Diagnosis not present

## 2023-12-18 DIAGNOSIS — I700 Atherosclerosis of aorta: Secondary | ICD-10-CM | POA: Diagnosis not present

## 2023-12-18 LAB — CBC
HCT: 38 % (ref 36.0–46.0)
Hemoglobin: 12.4 g/dL (ref 12.0–15.0)
MCH: 27.1 pg (ref 26.0–34.0)
MCHC: 32.6 g/dL (ref 30.0–36.0)
MCV: 83.2 fL (ref 80.0–100.0)
Platelets: 216 K/uL (ref 150–400)
RBC: 4.57 MIL/uL (ref 3.87–5.11)
RDW: 17.4 % — ABNORMAL HIGH (ref 11.5–15.5)
WBC: 6.3 K/uL (ref 4.0–10.5)
nRBC: 0 % (ref 0.0–0.2)

## 2023-12-18 LAB — BASIC METABOLIC PANEL WITH GFR
Anion gap: 20 — ABNORMAL HIGH (ref 5–15)
BUN: 106 mg/dL — ABNORMAL HIGH (ref 8–23)
CO2: 26 mmol/L (ref 22–32)
Calcium: 9.2 mg/dL (ref 8.9–10.3)
Chloride: 93 mmol/L — ABNORMAL LOW (ref 98–111)
Creatinine, Ser: 7.96 mg/dL — ABNORMAL HIGH (ref 0.44–1.00)
GFR, Estimated: 5 mL/min — ABNORMAL LOW (ref >60)
Glucose, Bld: 79 mg/dL (ref 70–99)
Potassium: 3.8 mmol/L (ref 3.5–5.1)
Sodium: 139 mmol/L (ref 135–145)

## 2023-12-18 LAB — CBG MONITORING, ED: Glucose-Capillary: 58 mg/dL — ABNORMAL LOW (ref 70–99)

## 2023-12-18 LAB — TROPONIN I (HIGH SENSITIVITY)
Troponin I (High Sensitivity): 27 ng/L — ABNORMAL HIGH (ref <18)
Troponin I (High Sensitivity): 23 ng/L — ABNORMAL HIGH (ref <18)

## 2023-12-18 LAB — BRAIN NATRIURETIC PEPTIDE: B Natriuretic Peptide: 26.4 pg/mL (ref 0.0–100.0)

## 2023-12-18 MED ORDER — DEXTROSE 50 % IV SOLN
1.0000 | Freq: Once | INTRAVENOUS | Status: DC
  Filled 2023-12-18: qty 50

## 2023-12-18 NOTE — Consult Note (Addendum)
 Cardiology Consultation   Patient ID: Maria Hudson MRN: [id number]; DOB: 05/04/1961  Admit date: 12/18/2023 Date of Consult: 12/18/2023  PCP:  Maria Atlas, MD   [facility] Providers Cardiologist:  Maria Bruckner, MD  Sleep Medicine:  Maria Sor, MD       Patient Profile: Maria Hudson is a 63 y.o. female with a hx of ESRD on HD, hypertension, CAD, HFpEF, DM type II, OSA who is being seen 12/18/2023 for the evaluation of chest, arm, neck pain at the request of Maria Prosperi PA-C.  History of Present Illness: Maria Hudson presented to the emergency department with symptoms of left arm pain radiating up into her neck.  Of note, patient with fistula in her left arm, underwent fistulogram and central venogram with vascular surgery this past Wednesday.  Patient found with widely patent central venous system widely patent cephalic arch and axillary vein severe approximate 80% stenosis of aVF and mid segment.  Patient completed her most recent dialysis session on Thursday 7/3.  Labs in the emergency department notable for creatinine of 7.96, anion gap of 20, BUN 106, chloride 93.  Her complete blood count was without significant derangement.  BNP checked and found reassuring at 26.4.  Troponin checked x 2 found to be minimally elevated and flat at 27 followed by 23.  Patient's EKG without any acute ischemia.  Nonspecific T wave abnormalities noted.  Patient has significant cardiac history, has previously been followed in the heart failure clinic.  Her most recent appointment took place in August 2024.  Patient has a long history of diastolic heart failure with frequent hospitalizations.  She previously moved to [place] from [place].  Prior to her move was followed by [facility] cardiology.  Historically has struggled with volume control.  She has a history of coronary artery disease with occluded PDA with collateral circulation.  Patient has started HD since she was last seen in  our heart failure clinic.  On exam in the emergency department, patient reports that she has been feeling just a bit off over the past few weeks.  She has a hard time expressing the exact symptoms that have led to her feeling as such.  Describes feeling like my heart is giving warning signs.  Today while walking into the [employer] she developed very brief but sharp left arm pain which she says took my breath away.  Patient found herself having to stop and take a few breaks due to the shortness of breath that was preceded by left arm pain.  Patient reports that she did not want to come to the hospital but was made to.  She expresses a strong desire to avoid overnight admission if possible.  She reiterates that the pain that she had in her left arm had not occurred prior to today.  Since being in the emergency department, patient reports having a few more episodes of left arm pain with some shortness of breath.  She continues to deny having any chest pain with physical activity.  She does report some subjective chest wall tenderness to palpation.  Patient speculates that the recent fistulogram and venogram that she underwent this past week could possibly be the cause of her left arm discomfort.   Past Medical History:  Diagnosis Date   Acute on chronic diastolic heart failure (HCC) 03/05/2021   Anemia    Arthritis    CAD (coronary artery disease)    CHF (congestive heart failure) (HCC)    Colon polyps  CVA (cerebral vascular accident) (HCC)    Depression    Diabetes mellitus without complication (HCC)    type 2   Difficult intubation    Dyspnea    ESRD on hemodialysis (HCC)    Hypertension    Hypertensive urgency 04/14/2021   Memory loss    mild   OSA (obstructive sleep apnea) 05/27/2021   uses CPAP 12-18 cm   Pneumonia    several times   Pure hypercholesterolemia 05/27/2021   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   Stroke [facility])     Past Surgical History:  Procedure  Laterality Date   A/V FISTULAGRAM Left 08/09/2023   Procedure: A/V Fistulagram;  Surgeon: Sheree Penne Bruckner, MD;  Location: [facility] INVASIVE CV LAB;  Service: Cardiovascular;  Laterality: Left;   A/V SHUNT INTERVENTION N/A 12/15/2023   Procedure: A/V SHUNT INTERVENTION;  Surgeon: Pearline Norman RAMAN, MD;  Location: [facility] PV LAB;  Service: Cardiovascular;  Laterality: N/A;   ABDOMINAL HYSTERECTOMY     AV FISTULA PLACEMENT Left 12/09/2021   Procedure: LEFT ARTERIOVENOUS (AV) FISTULA CREATION;  Surgeon: Sheree Penne Bruckner, MD;  Location[facility] OR;  Service: Vascular;  Laterality: Left;   BIOPSY  09/08/2021   Procedure: BIOPSY;  Surgeon: Legrand Victory LITTIE DOUGLAS, MD;  Location: [facility]COPY;  Service: Gastroenterology;;   CESAREAN SECTION     x 1   ESOPHAGOGASTRODUODENOSCOPY (EGD) WITH PROPOFOL  N/A 09/08/2021   Procedure: ESOPHAGOGASTRODUODENOSCOPY (EGD) WITH PROPOFOL ;  Surgeon: Legrand Victory LITTIE DOUGLAS, MD;  Location: [facility];  Service: Gastroenterology;  Laterality: N/A;  chest pain, epigastric pain   FISTULA SUPERFICIALIZATION Left 08/11/2023   Procedure: FISTULA SUPERFICIALIZATION LEFT UPPER ARM;  Surgeon: Magda Maria SAILOR, MD;  Location: [facility] OR;  Service: Vascular;  Laterality: Left;   INNER EAR SURGERY Bilateral    INSERTION OF DIALYSIS CATHETER Right 08/11/2023   Procedure: INSERTION OF DIALYSIS CATHETER;  Surgeon: Magda Maria SAILOR, MD;  Location: [facility] OR;  Service: Vascular;  Laterality: Right;   RIGHT HEART CATH N/A 05/14/2021   Procedure: RIGHT HEART CATH;  Surgeon: Rolan Ezra RAMAN, MD;  Location: [facility] INVASIVE CV LAB;  Service: Cardiovascular;  Laterality: N/A;   TONSILLECTOMY     age 63       Scheduled Meds:  dextrose   1 ampule Intravenous Once   Continuous Infusions:  PRN Meds:   Allergies:    Allergies  Allergen Reactions   Metformin And Related Nausea Only   Other     Clonidine  patch causes skin irritation    Hydrocodone Itching    Patient able to tolerate when taken with  Benadryl.   Oxycodone -Acetaminophen  Itching    Patient able to tolerate when taken with Benadryl.    Social History:   Social History   Socioeconomic History   Marital status: Divorced    Spouse name: Not on file   Number of children: 2   Years of education: Not on file   Highest education level: Some college, no degree  Occupational History   Occupation: retired   Occupation: disabled  Tobacco Use   Smoking status: Never    Passive exposure: Never   Smokeless tobacco: Never  Vaping Use   Vaping status: Never Used  Substance and Sexual Activity   Alcohol use: Not Currently   Drug use: Never   Sexual activity: Not Currently    Birth control/protection: Post-menopausal, Surgical  Other Topics Concern   Not on file  Social History Narrative   Not on file   Social Drivers of Health  Financial Resource Strain: Low Risk  (05/31/2023)   [facility]   Overall Financial Resource Strain (CARDIA)    Difficulty of Paying Living Expenses: Not very hard  Food Insecurity: No Food Insecurity (10/12/2023)   Hunger Vital Sign    Worried About Running Out of Food in the Last Year: Never true    Ran Out of Food in the Last Year: Never true  Transportation Needs: No Transportation Needs (10/12/2023)   PRAPARE - Administrator, Civil Service (Medical): No    Lack of Transportation (Non-Medical): No  Physical Activity: Inactive (08/23/2023)   Exercise Vital Sign    Days of Exercise per Week: 0 days    Minutes of Exercise per Session: 0 min  Stress: No Stress Concern Present (08/23/2023)   [employer] of Occupational Health - Occupational Stress Questionnaire    Feeling of Stress : Only a little  Social Connections: Moderately Isolated (08/17/2023)   Social Connection and Isolation Panel    Frequency of Communication with Friends and Family: More than three times a week    Frequency of Social Gatherings with Friends and Family: More than three times a  week    Attends Religious Services: More than 4 times per year    Active Member of [employer] or Organizations: No    Attends Banker Meetings: Never    Marital Status: Divorced  Catering manager Violence: Not At Risk (10/12/2023)   Humiliation, Afraid, Rape, and Kick questionnaire    Fear of Current or Ex-Partner: No    Emotionally Abused: No    Physically Abused: No    Sexually Abused: No    Family History:    Family History  Problem Relation Age of Onset   Hypertension Mother    Stroke Mother    Diabetes Mother    Other Mother        enlarged heart   Hypertension Father    Hypertension Sister    Diabetes Sister    CAD Sister        enlarged heart   Kidney disease Sister    Diabetes Paternal Grandmother    Hypertension Daughter    Diabetes Daughter    Hypertension Son    Diabetes Son    Breast cancer Paternal Aunt      ROS:  Please see the history of present illness.   All other ROS reviewed and negative.     Physical Exam/Data: Vitals:   12/18/23 1029 12/18/23 1135 12/18/23 1338 12/18/23 1400  BP: (!) 140/74 (!) 163/66 (!) 161/65 (!) 150/69  Pulse: 64 64 64 60  Resp: 19 14 15 14   Temp: 97.7 F (36.5 C)  (!) 97.5 F (36.4 C)   TempSrc:   Oral   SpO2: 98% 100% 96% 100%   No intake or output data in the 24 hours ending 12/18/23 1604    10/14/2023   12:55 PM 10/14/2023    8:18 AM 10/12/2023    5:30 PM  Last 3 Weights  Weight (lbs) 211 lb 6.7 oz 215 lb 13.3 oz 226 lb 10.1 oz  Weight (kg) 95.9 kg 97.9 kg 102.8 kg     There is no height or weight on file to calculate BMI.  General:  Well nourished, well developed, in no acute distress HEENT: normal Neck: no JVD Vascular: No carotid bruits; Distal pulses 2+ bilaterally Cardiac:  normal S1, S2; RRR; noisy auscultation with whooshing from fistula Lungs:  clear to auscultation bilaterally,  no wheezing, rhonchi or rales  Abd: soft, nontender, no hepatomegaly  Ext: no edema Musculoskeletal:  No  deformities, BUE and BLE strength normal and equal Skin: warm and dry  Neuro:  CNs 2-12 intact, no focal abnormalities noted Psych:  Normal affect   EKG:  The EKG was personally reviewed and demonstrates:  sinus rhythm, no acute ischemic changes Telemetry:  Telemetry was personally reviewed and demonstrates:  sinus rhythm with isolated PVCs.  Relevant CV Studies:  10/28/22 TTE  IMPRESSIONS     1. Left ventricular ejection fraction, by estimation, is 60 to 65%. The  left ventricle has normal function. The left ventricle has no regional  wall motion abnormalities. There is moderate left ventricular hypertrophy.  Left ventricular diastolic  parameters are consistent with Grade I diastolic dysfunction (impaired  relaxation).   2. Right ventricular systolic function is normal. The right ventricular  size is normal. There is normal pulmonary artery systolic pressure.   3. Left atrial size was mildly dilated.   4. The mitral valve is normal in structure. No evidence of mitral valve  regurgitation.   5. The aortic valve is normal in structure. Aortic valve regurgitation is  not visualized.   6. The inferior vena cava is normal in size with greater than 50%  respiratory variability, suggesting right atrial pressure of 3 mmHg.   FINDINGS   Left Ventricle: Left ventricular ejection fraction, by estimation, is 60  to 65%. The left ventricle has normal function. The left ventricle has no  regional wall motion abnormalities. The left ventricular internal cavity  size was normal in size. There is   moderate left ventricular hypertrophy. Left ventricular diastolic  parameters are consistent with Grade I diastolic dysfunction (impaired  relaxation).   Right Ventricle: The right ventricular size is normal. Right ventricular  systolic function is normal. There is normal pulmonary artery systolic  pressure. The tricuspid regurgitant velocity is 2.83 m/s, and with an  assumed right atrial  pressure of 3 mmHg,   the estimated right ventricular systolic pressure is 35.0 mmHg.   Left Atrium: Left atrial size was mildly dilated.   Right Atrium: Right atrial size was normal in size.   Pericardium: There is no evidence of pericardial effusion.   Mitral Valve: The mitral valve is normal in structure. No evidence of  mitral valve regurgitation.   Tricuspid Valve: Tricuspid valve regurgitation is mild.   Aortic Valve: The aortic valve is normal in structure. Aortic valve  regurgitation is not visualized.   Pulmonic Valve: Pulmonic valve regurgitation is trivial.   Aorta: The aortic root and ascending aorta are structurally normal, with  no evidence of dilitation.   Venous: The inferior vena cava is normal in size with greater than 50%  respiratory variability, suggesting right atrial pressure of 3 mmHg.   IAS/Shunts: No atrial level shunt detected by color flow Doppler.    Laboratory Data: High Sensitivity Troponin:   Recent Labs  Lab 12/18/23 1031 12/18/23 1231  TROPONINIHS 27* 23*     Chemistry Recent Labs  Lab 12/18/23 1031  NA 139  K 3.8  CL 93*  CO2 26  GLUCOSE 79  BUN 106*  CREATININE 7.96*  CALCIUM  9.2  GFRNONAA 5*  ANIONGAP 20*    No results for input(s): PROT, ALBUMIN, AST, ALT, ALKPHOS, BILITOT in the last 168 hours. Lipids No results for input(s): CHOL, TRIG, HDL, LABVLDL, LDLCALC, CHOLHDL in the last 168 hours.  Hematology Recent Labs  Lab 12/18/23 1031  WBC 6.3  RBC 4.57  HGB 12.4  HCT 38.0  MCV 83.2  MCH 27.1  MCHC 32.6  RDW 17.4*  PLT 216   Thyroid  No results for input(s): TSH, FREET4 in the last 168 hours.  BNP Recent Labs  Lab 12/18/23 1133  BNP 26.4    DDimer No results for input(s): DDIMER in the last 168 hours.  Radiology/Studies:  DG Chest 2 View Result Date: 12/18/2023 CLINICAL DATA:  Shortness of breath and arm pain.  Dialysis patient. EXAM: CHEST - 2 VIEW COMPARISON:  10/11/2023  FINDINGS: Stable mild cardiac enlargement. Aortic atherosclerotic calcification. No pleural fluid or consolidation. Increased pulmonary vascularity without frank edema. Visualized osseous structures are intact. IMPRESSION: Cardiomegaly and increased pulmonary vascularity without frank edema. Electronically Signed   By: Waddell Calk M.D.   On: 12/18/2023 12:02   PERIPHERAL VASCULAR CATHETERIZATION Result Date: 12/15/2023 Images from the original result were not included. Patient name: JOELIE SCHOU MRN: [id number] DOB: [birth year] Sex: female 12/15/2023 Pre-operative Diagnosis: ESRD on HD, high venous pressures with sessions Post-operative diagnosis:  Same Surgeon:  Norman GORMAN Serve, MD Procedure Performed: Ultrasound-guided access of left arm aVF Fistulogram and central venogram Balloon angioplasty of left arm aVF, 7 mm x 40 mm Mustang Indications: Ms. Yablonski is a 63 year old female with ESRD on HD who presented to the HD access center for intervention.  She has been having issues with high pressures and pulsatility.  Her last HD session was yesterday.  Risk benefits of fistulogram or events were reviewed, she expressed understanding and elected to proceed. Findings: Widely patent central venous system.  Widely patent cephalic arch and axillary vein.  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  The anastomosis appears widely patent.  Procedure:  The patient was identified in the holding area and taken to the cath lab  The patient was then placed supine on the table and prepped and draped in the usual sterile fashion.  A time out was called.  Ultrasound was used to evaluate the left arm AV access. This was accessed under u/s guidance. An 018 wire was advanced without resistance, a micropuncture sheath was placed and fistulagram obtained which demonstrated the above findings.  This access was then upsized to a 84F short sheath over a guidewire. The stenosis was then treated with a 7 mm x 40  mm Mustang balloon with an excellent response and less than  20% residual stenosis. Contrast: 20 cc Impression: Appropriate response to balloon angioplasty of the mid segment fistula stenosis.  Could upsized to at least a 9 mm balloon at next intervention. Norman GORMAN Serve MD [facility] Office: [phone]     Assessment and Plan:  Hypertension Chronic diastolic heart failure With history of chronic diastolic heart failure reports slow but steady weight gain since starting dialysis.  She attributes to significant increase in appetite.  Has not felt symptoms consistent with congestive heart failure.  In the emergency department today, patient appears euvolemic without significant edema, no pulmonary crackles on exam.  Her blood pressure is moderately elevated, up to 163/66.  Patient's labs including BNP are not suggestive of acute congestive heart failure. Continue Farxiga  10 mg, Imdur  120 mg, Coreg  25 mg twice daily  Coronary artery disease Chest pain Patient reporting several weeks of nonspecific sensation leading her to conclude that something with her heart is off.  Today [facility] into [facility] she developed sharp left arm pain that  she feels provoked shortness of breath.  Shortness of breath noted to improve with rest, did not notice that this changed her arm pain as duration of discomfort was very limited.  She denies chest pain with this shortness of breath and left arm pain, though does note some subjective chest wall tenderness to palpation.  EKG does not reveal acute ischemic changes.  Troponin of 27, 23 in an ESRD patient is rather reassuring.  However, patient does have risk factors for ischemia and a history of catheterization showing occluded PDA with collateral circulation. Patient is anxious to avoid an overnight stay.  However I feel that she may warrant overnight observation with an echocardiogram tomorrow.  If this echocardiogram  appears stable from her most recent study from just over 1 year ago, could continue with outpatient follow-up.  Will discuss inpatient versus out patient echocardiogram with Dr. Lavona. Continue ASA 81 mg, atorvastatin  40 mg, carvedilol  25 mg twice daily  Per primary team: ESRD on HD Diabetes mellitus    Risk Assessment/Risk Scores:       [place]  Heart Association (NYHA) Functional Class NYHA Class I     For questions or updates, please contact [facility] Please consult [email or web address] for contact info under    Signed, Artist Pouch, PA-C  12/18/2023 4:04 PM  History and all data above reviewed.  Patient examined.  I agree with the findings as above.    The patient presented with left arm pain.  She described this to me as a shooting discomfort that her left arm.  It happened when she was walking on level ground.  She did have it happening when she came to the emergency room.  She has not had this before.  She was not describing associated nausea vomiting or diaphoresis.  She did not have chest discomfort or discomfort radiating to her jaw.  She does remember having this previously.  She has not had any new recent shortness of breath, PND or orthopnea.  Has not been having any palpitations.  She states she is active.  She tolerates dialysis.  The patient exam reveals COR: Regular rate and rhythm, no rubs, 3 out of 6 holosystolic murmur likely related to dialysis,  Lungs: Clear to auscultation bilaterally, no wheezing, crackles,  Abd: Positive bowel sounds normal frequency pitch, bruits, rebound, guarding, Ext extremities 2+ pulses, right upper dialysis fistula with thrill and bruit.  All available labs, radiology testing, previous records reviewed. Agree with documented assessment and plan.  Arm pain: This is nonanginal greater than anginal.  EKG was nonacute.  Enzymes were low and flat and not at all diagnostic.  Given this I do not think that admission is indicated.  Given her  risk factors and previous history we will plan outpatient perfusion imaging.  She will be notified with this.  She is to return to the ER if she has recurrent discomfort.  Hypertension: Blood pressure is elevated and she should monitor this at home.  Continue with previous medications.  Lynwood Niasia Lanphear  5:08 PM  12/18/2023

## 2023-12-18 NOTE — ED Provider Notes (Signed)
 [facility] EMERGENCY DEPARTMENT AT [facility] Provider Note   CSN: [id number] Arrival date & time: 12/18/23  1024     Patient presents with: Arm Pain and Neck Pain   Maria Hudson is a 63 y.o. female With PMH significant for CHF, DM2, ESRD on dialysis, HTN, this morning around 830 -- felt like heart slowed down and then picked back up. Pain radiating to left arm and into jaw. Endorsed shortness of breath. Some nausea and lightheadedness. Was able to call first aid at church. No improvement with tylenol . Increased exertion. Arm pain coming and going. Some abnormal sensation with palpation of jaw.     Arm Pain  Neck Pain      Prior to Admission medications   Medication Sig Start Date End Date Taking? Authorizing Provider  Accu-Chek Softclix Lancets lancets 3 (three) times daily. 06/26/21   [provider]  acetaminophen  (TYLENOL ) 500 MG tablet Take 1,000 mg by mouth every 6 (six) hours as needed for moderate pain or headache.    [provider]  allopurinol  (ZYLOPRIM ) 100 MG tablet Take 100 mg by mouth in the morning. 01/27/21   [provider]  aspirin  EC 81 MG tablet Take 81 mg by mouth in the morning. Swallow whole.    [provider]  atorvastatin  (LIPITOR) 40 MG tablet Take 1 tablet (40 mg total) by mouth every evening. 05/27/21   Raford Riggs, MD  buPROPion  (WELLBUTRIN ) 100 MG tablet Take 100 mg by mouth 2 (two) times daily. 02/04/21   [provider]  carvedilol  (COREG ) 25 MG tablet Take 1 tablet (25 mg total) by mouth 2 (two) times daily with a meal. 03/07/21   [name], Elidia Sieving, MD  cholecalciferol (VITAMIN D3) 25 MCG (1000 UNIT) tablet Take 2,000 Units by mouth daily.    [provider]  Continuous Blood Gluc Transmit (DEXCOM G6 TRANSMITTER) MISC Use one every 3 month 05/25/22   [provider]  dapagliflozin  propanediol (FARXIGA ) 10 MG TABS tablet TAKE 1 TABLET BY MOUTH DAILY BEFORE BREAKFAST.  04/20/23   MilfordHarlene HERO, FNP  ferrous sulfate  325 (65 FE) MG tablet Take 325 mg by mouth 2 (two) times daily. 05/31/21   [provider]  fluticasone  (FLONASE ) 50 MCG/ACT nasal spray Place 1 spray into both nostrils daily as needed for allergies or rhinitis.    [provider]  hydrALAZINE  (APRESOLINE ) 100 MG tablet TAKE 1 TABLET BY MOUTH 3 TIMES DAILY. Patient taking differently: Take 50 mg by mouth 3 (three) times daily. 05/22/22   Raford Riggs, MD  insulin  glargine (LANTUS ) 100 unit/mL SOPN Inject 0-10 Units into the skin at bedtime as needed (if blood gluose is above 130 take 2 units 150 and above 10 units).    [provider]  isosorbide  mononitrate (IMDUR ) 120 MG 24 hr tablet Take 120 mg by mouth at bedtime.    [provider]  LINZESS  145 MCG CAPS capsule TAKE 1 CAPSULE BY MOUTH DAILY BEFORE BREAKFAST. 05/12/23   Zehr, Jessica D, PA-C  loratadine  (CLARITIN ) 10 MG tablet Take 10 mg by mouth in the morning. 02/14/21   [provider]  Multiple Vitamin (MULTIVITAMIN ADULT) TABS Take 1 tablet by mouth daily with lunch.    [provider]  nitroGLYCERIN  (NITROSTAT ) 0.4 MG SL tablet Place 1 tablet (0.4 mg total) under the tongue every 5 (five) minutes as needed for chest pain. 03/07/21   [name], Elidia Sieving, MD  NOVOLOG  FLEXPEN 100 UNIT/ML FlexPen Inject  5 Units into the skin 3 (three) times daily with meals. 01/12/21   [provider]  Omega-3 Fatty Acids (FISH OIL) 1000 MG CAPS Take 1,000 mg by mouth daily.    [provider]  OZEMPIC , 1 MG/DOSE, 4 MG/3ML SOPN Inject 1 mg into the skin every Tuesday.    [provider]  pregabalin  (LYRICA ) 75 MG capsule Take 75 mg by mouth 2 (two) times daily. 02/03/21   [provider]  sertraline  (ZOLOFT ) 50 MG tablet Take 50 mg by mouth in the morning. 02/01/21   [provider]  sucroferric oxyhydroxide (VELPHORO ) 500 MG chewable tablet Chew 1 tablet (500  mg total) by mouth 3 (three) times daily with meals. 10/15/23   Lue Elsie BROCKS, MD  tiZANidine (ZANAFLEX) 4 MG tablet Take 4 mg by mouth 2 (two) times daily as needed for muscle spasms.    [provider]  traMADol  (ULTRAM ) 50 MG tablet Take 1 tablet (50 mg total) by mouth every 12 (twelve) hours as needed. 08/11/23   Rhyne, Samantha J, PA-C  traZODone  (DESYREL ) 50 MG tablet Take 50 mg by mouth at bedtime.    [provider]  TURMERIC CURCUMIN PO Take 1,200 mg by mouth daily at 6 (six) AM. 600 mg each Chew    [provider]    Allergies: Metformin and related, Other, Hydrocodone, and Oxycodone -acetaminophen     Review of Systems  Musculoskeletal:  Positive for neck pain.  All other systems reviewed and are negative.   Updated Vital Signs BP (!) 150/69   Pulse 60   Temp (!) 97.5 F (36.4 C) (Oral)   Resp 14   SpO2 100%   Physical Exam Vitals and nursing note reviewed.  Constitutional:      General: Maria Hudson is not in acute distress.    Appearance: Normal appearance.  HENT:     Head: Normocephalic and atraumatic.  Eyes:     General:        Right eye: No discharge.        Left eye: No discharge.  Cardiovascular:     Rate and Rhythm: Normal rate and regular rhythm.     Heart sounds: Murmur heard.     No friction rub. No gallop.     Comments: Grade 4/6 systolic murmur is easily auscultated.  Otherwise normal heart rate and rhythm. Pulmonary:     Effort: Pulmonary effort is normal.     Breath sounds: Normal breath sounds.  Abdominal:     General: Bowel sounds are normal.     Palpations: Abdomen is soft.  Skin:    General: Skin is warm and dry.     Capillary Refill: Capillary refill takes less than 2 seconds.  Neurological:     Mental Status: Maria Hudson is alert and oriented to person, place, and time.  Psychiatric:        Mood and Affect: Mood normal.        Behavior: Behavior normal.     (all labs ordered are listed, but only abnormal results are  displayed) Labs Reviewed  BASIC METABOLIC PANEL WITH GFR - Abnormal; Notable for the following components:      Result Value   Chloride 93 (*)    BUN 106 (*)    Creatinine, Ser 7.96 (*)    GFR, Estimated 5 (*)    Anion gap 20 (*)    All other components within normal limits  CBC - Abnormal; Notable for the following components:   RDW  17.4 (*)    All other components within normal limits  CBG MONITORING, ED - Abnormal; Notable for the following components:   Glucose-Capillary 58 (*)    All other components within normal limits  TROPONIN I (HIGH SENSITIVITY) - Abnormal; Notable for the following components:   Troponin I (High Sensitivity) 27 (*)    All other components within normal limits  TROPONIN I (HIGH SENSITIVITY) - Abnormal; Notable for the following components:   Troponin I (High Sensitivity) 23 (*)    All other components within normal limits  BRAIN NATRIURETIC PEPTIDE    EKG: EKG Interpretation Date/Time:  Saturday December 18 2023 10:35:49 EDT Ventricular Rate:  61 PR Interval:  210 QRS Duration:  106 QT Interval:  448 QTC Calculation: 450 R Axis:   -35  Text Interpretation: Sinus rhythm with 1st degree A-V block Left axis deviation When compared with ECG of 11-Oct-2023 21:13, PREVIOUS ECG IS PRESENT No significant changes Confirmed by Cottie Cough [phone]) on 12/18/2023 12:22:08 PM  Radiology: [facility] Chest 2 View Result Date: 12/18/2023 CLINICAL DATA:  Shortness of breath and arm pain.  Dialysis patient. EXAM: CHEST - 2 VIEW COMPARISON:  10/11/2023 FINDINGS: Stable mild cardiac enlargement. Aortic atherosclerotic calcification. No pleural fluid or consolidation. Increased pulmonary vascularity without frank edema. Visualized osseous structures are intact. IMPRESSION: Cardiomegaly and increased pulmonary vascularity without frank edema. Electronically Signed   By: Waddell Calk M.D.   On: 12/18/2023 12:02     Procedures   Medications Ordered in the ED  dextrose  50 %  solution 50 mL (50 mLs Intravenous Patient Refused/Not Given 12/18/23 1150)                                     Medical Decision Making Amount and/or Complexity of Data Reviewed Labs: ordered. Radiology: ordered.  Risk Prescription drug management.   This patient is a 63 y.o. female  who presents to the ED for concern of chest pain, jaw pain, left arm pain, shortness of breath.   Differential diagnoses prior to evaluation: The emergent differential diagnosis includes, but is not limited to,  ACS, AAS, PE, Mallory-Weiss, Boerhaave's, Pneumonia, acute bronchitis, asthma or COPD exacerbation, anxiety, MSK pain or traumatic injury to the chest, acid reflux versus other . This is not an exhaustive differential.   Past Medical History / Co-morbidities / Social History: CHF, DM2, ESRD on dialysis, HTN  Additional history: Chart reviewed. Pertinent results include: Reviewed previous echoes, stress test, lab work, imaging from previous emergency room visits  Physical Exam: Physical exam performed. The pertinent findings include: Patient hypertensive, blood pressure 140/74 on arrival.  Overall normal heart and lung sounds, Maria Hudson does have a grade 4/6 systolic murmur which is known.  No significant peripheral edema.  Lab Tests/Imaging studies: I personally interpreted labs/imaging and the pertinent results include: CBC unremarkable, BMP with significantly elevated BUN at 106, creatinine 7.96, anion gap of 20 likely secondary to her uremia.  Maria Hudson is not having any confusion or distress related to the uremia, suspect related to her need for dialysis, her other electrolytes are for the most part unremarkable other than mild hypochloremia, chloride 93.  Initial troponin elevated 27 with delta at 23.  Normal BNP.  Her initial CBG was low at 58 secondary to her administering insulin  just prior to arrival, Maria Hudson ate some candy and is monitoring her continuous glucose on her phone, it has remained stable in  the  ED.  I independently interpreted plain film chest x-ray which shows cardiomegaly and vascular prominence without overt edema.  I agree with the radiologist interpretation.  Cardiac monitoring: EKG obtained and interpreted by myself and attending physician which shows: Normal sinus rhythm, first degree AV block, no significant change from recent baseline   Medications: Offered D50 secondary to her hypoglycemia, but Maria Hudson prefers to eat some candy and check her personal glucose monitor.   Consults: Spoke with cardiology, Dr. Lavona, who after description of her high risk chest pain presentation recommends bedside evaluation, we will appreciate their recommendations for disposition.  3:18 PM Care of Rikita Grabert Peasley transferred to PA [facility] and Dr. Glendia at the end of my shift as the patient will require reassessment once labs/imaging have resulted. Patient presentation, ED course, and plan of care discussed with review of all pertinent labs and imaging. Please see his/her note for further details regarding further ED course and disposition. Plan at time of handoff is pending cardiology recommendations. This may be altered or completely changed at the discretion of the oncoming team pending results of further workup.   Final diagnoses:  None    ED Discharge Orders     None          Rosan Sherlean VEAR DEVONNA 12/18/23 1518    Cottie Donnice PARAS, MD 12/18/23 1539

## 2023-12-18 NOTE — Progress Notes (Signed)
  Lexiscan myoview ordered per Dr. Lavona due to left arm pain and dyspnea concerning for anginal equivalent.   Informed Consent   Shared Decision Making/Informed Consent The risks [chest pain, shortness of breath, cardiac arrhythmias, dizziness, blood pressure fluctuations, myocardial infarction, stroke/transient ischemic attack, nausea, vomiting, allergic reaction, radiation exposure, metallic taste sensation and life-threatening complications (estimated to be 1 in 10,000)], benefits (risk stratification, diagnosing coronary artery disease, treatment guidance) and alternatives of a nuclear stress test were discussed in detail with Ms. Talcott and she agrees to proceed.      Artist Pouch, PA-C

## 2023-12-18 NOTE — ED Provider Notes (Signed)
  Physical Exam   Vitals:   12/18/23 1029 12/18/23 1135 12/18/23 1338 12/18/23 1400  BP: (!) 140/74 (!) 163/66 (!) 161/65 (!) 150/69  Pulse: 64 64 64 60  Resp: 19 14 15 14   Temp: 97.7 F (36.5 C)  (!) 97.5 F (36.4 C)   TempSrc:   Oral   SpO2: 98% 100% 96% 100%     Physical Exam  Procedures  Procedures  ED Course / MDM    Medical Decision Making Amount and/or Complexity of Data Reviewed Labs: ordered. Radiology: ordered.  Risk Prescription drug management.   Received at shift change from [facility], see their note for initial history, physical exam findings, labs/imaging interpretation, medication management, initial assessment/plan.   Patient presenting with atypical chest pain as well as shortness of breath on exertion, jaw pain, arm pain.  Elevated troponin with initial troponin of 27 and repeat troponin of 23, however in the setting of ESRD this is likely reflective of patient's baseline.  Patient was borderline hypoglycemic at presentation but has eaten candy and is monitoring her CGM which shows improvement. Pending cardiology consult, Dr. Lavona was consulted by prior PA-C.   See Dr. Denver consult note for plan, he feels that patient is appropriate for discharge with outpatient follow-up for nuclear stress test.  Patient is aware, she has no other questions or concerns at this time. She is appropriate for discharge, return precautions discussed.        Maria Hudson, [place] 12/18/23 1745    Geraldene Glendia, MD 12/19/23 2224

## 2023-12-18 NOTE — Discharge Instructions (Addendum)
 Follow-up with your cardiologist as previously discussed.  Keep your scheduled appointment for dialysis on Monday.  Keep your scheduled appointment with your primary care provider.  Return to the emergency department if your symptoms persist or worsen.

## 2023-12-18 NOTE — ED Triage Notes (Signed)
 Pt c.o left arm pain that radiates up into her neck that started about an hour ago. Pt has a fistula in her left arm, last dialysis was Thursday. Pt states she had a fistulogram and venogram on Wednesday, no issues

## 2023-12-21 DIAGNOSIS — G4733 Obstructive sleep apnea (adult) (pediatric): Secondary | ICD-10-CM | POA: Diagnosis not present

## 2023-12-22 ENCOUNTER — Encounter: Payer: Self-pay | Admitting: Vascular Surgery

## 2023-12-22 ENCOUNTER — Other Ambulatory Visit: Payer: Self-pay

## 2023-12-22 ENCOUNTER — Ambulatory Visit: Attending: Vascular Surgery | Admitting: Vascular Surgery

## 2023-12-22 VITALS — BP 153/73 | HR 66 | Temp 97.8°F | Ht 63.0 in | Wt 228.0 lb

## 2023-12-22 DIAGNOSIS — N186 End stage renal disease: Secondary | ICD-10-CM | POA: Diagnosis not present

## 2023-12-22 NOTE — Progress Notes (Signed)
 Patient ID: Maria Hudson, female   DOB: Jun 14, 1961, 64 y.o.   MRN: [id number]  Reason for Consult: Follow-up   Referred by Shelda Atlas, MD  Subjective:     HPI:  Maria Hudson is a 63 y.o. female history of end-stage renal disease currently dialyzing Mondays, Tuesdays, Thursdays and Fridays via left upper arm AV fistula with plans for transition to home.  She takes aspirin  no other blood thinners.  We have previously evaluated her for peritoneal dialysis catheter placement but due to her weight and previous abdominal surgeries which include emergent cesarean section and tubal ligation we elected for hemodialysis.  She is now here to rediscuss peritoneal dialysis access.  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   Stroke [facility])    Family History  Problem Relation Age of Onset   Hypertension Mother    Stroke Mother    Diabetes Mother    Other Mother        enlarged heart   Hypertension Father    Hypertension Sister    Diabetes Sister    CAD Sister        enlarged heart   Kidney disease Sister    Diabetes Paternal Grandmother    Hypertension Daughter    Diabetes Daughter    Hypertension Son    Diabetes Son    Breast cancer Paternal Aunt    Past Surgical History:  Procedure Laterality Date   A/V FISTULAGRAM Left 08/09/2023   Procedure: A/V Fistulagram;  Surgeon: Sheree Penne Bruckner,  MD;  Location: [facility] INVASIVE CV LAB;  Service: Cardiovascular;  Laterality: Left;   A/V SHUNT INTERVENTION N/A 12/15/2023   Procedure: A/V SHUNT INTERVENTION;  Surgeon: Pearline Norman GORMAN, MD;  Location: [facility] PV LAB;  Service: Cardiovascular;  Laterality: N/A;   ABDOMINAL HYSTERECTOMY     AV FISTULA PLACEMENT Left 12/09/2021   Procedure: LEFT ARTERIOVENOUS (AV) FISTULA CREATION;  Surgeon: Sheree Penne Bruckner, MD;  Location: [facility] OR;  Service: Vascular;  Laterality: Left;   BIOPSY  09/08/2021   Procedure: BIOPSY;  Surgeon: Legrand Victory LITTIE DOUGLAS, MD;  Location: [facility] ENDOSCOPY;  Service: Gastroenterology;;   CESAREAN SECTION     x 1   ESOPHAGOGASTRODUODENOSCOPY (EGD) WITH PROPOFOL  N/A 09/08/2021   Procedure: ESOPHAGOGASTRODUODENOSCOPY (EGD) WITH PROPOFOL ;  Surgeon: Legrand Victory LITTIE DOUGLAS, MD;  Location: [facility] ENDOSCOPY;  Service: Gastroenterology;  Laterality: N/A;  chest pain, epigastric pain   FISTULA SUPERFICIALIZATION Left 08/11/2023   Procedure: FISTULA SUPERFICIALIZATION LEFT UPPER ARM;  Surgeon: Magda Debby SAILOR, MD;  Location: [facility] OR;  Service: Vascular;  Laterality: Left;   INNER EAR SURGERY Bilateral    INSERTION OF DIALYSIS CATHETER Right 08/11/2023   Procedure: INSERTION OF DIALYSIS CATHETER;  Surgeon: Magda Debby SAILOR, MD;  Location: [facility] OR;  Service: Vascular;  Laterality: Right;   RIGHT HEART CATH N/A 05/14/2021   Procedure: RIGHT HEART CATH;  Surgeon: Rolan Ezra RAMAN, MD;  Location: [facility] INVASIVE CV LAB;  Service: Cardiovascular;  Laterality: N/A;   TONSILLECTOMY     age 16    Short Social History:  Social History   Tobacco Use   Smoking status: Never    Passive exposure: Never   Smokeless tobacco: Never  Substance Use Topics   Alcohol use: Not Currently    Allergies  Allergen Reactions   Metformin And Related Nausea Only   Other     Clonidine  patch causes skin irritation    Hydrocodone Itching    Patient able to tolerate when taken with Benadryl.   Oxycodone -Acetaminophen  Itching     Patient able to tolerate when taken with Benadryl.    Current Outpatient Medications  Medication Sig Dispense Refill   Accu-Chek Softclix Lancets lancets 3 (three) times daily.     acetaminophen  (TYLENOL ) 500 MG tablet Take 1,000 mg by mouth every 6 (six) hours as needed for moderate pain or headache.     allopurinol  (ZYLOPRIM ) 100 MG tablet Take 100 mg by mouth in the morning.     aspirin  EC 81 MG tablet Take 81 mg by mouth in the morning. Swallow whole.     atorvastatin  (LIPITOR) 40 MG tablet Take 1 tablet (40 mg total) by mouth every evening. 90 tablet 3   buPROPion  (WELLBUTRIN ) 100 MG tablet Take 100 mg by mouth 2 (two) times daily.     carvedilol  (COREG ) 25 MG tablet Take 1 tablet (25 mg total) by mouth 2 (two) times daily with a meal. 60 tablet 0   cholecalciferol (VITAMIN D3) 25 MCG (1000 UNIT) tablet Take 2,000 Units by mouth daily.     Continuous Blood Gluc Transmit (DEXCOM G6 TRANSMITTER) MISC Use one every 3 month     dapagliflozin  propanediol (FARXIGA ) 10 MG TABS tablet TAKE 1 TABLET BY MOUTH DAILY BEFORE BREAKFAST. 30 tablet 11   ferrous sulfate  325 (65 FE) MG tablet Take 325 mg by mouth 2 (two) times daily.     fluticasone  (FLONASE ) 50 MCG/ACT nasal spray Place 1 spray into both nostrils daily as needed for allergies or rhinitis.     hydrALAZINE  (APRESOLINE ) 100 MG tablet TAKE 1 TABLET BY MOUTH 3 TIMES DAILY. (Patient taking differently: Take 50 mg by mouth 3 (three) times daily.) 270 tablet 2   insulin  glargine (LANTUS ) 100 unit/mL SOPN Inject 0-10 Units into the skin at bedtime as needed (if blood gluose is above 130 take 2 units 150 and above 10 units).     isosorbide  mononitrate (IMDUR ) 120 MG 24 hr tablet Take 120 mg by mouth at bedtime.     LINZESS  145 MCG CAPS capsule TAKE 1 CAPSULE BY MOUTH DAILY BEFORE BREAKFAST. 30 capsule 11   loratadine  (CLARITIN ) 10 MG tablet Take 10 mg by mouth in the morning.     Multiple Vitamin (MULTIVITAMIN ADULT) TABS Take 1 tablet by mouth  daily with lunch.     nitroGLYCERIN  (NITROSTAT ) 0.4 MG SL tablet Place 1 tablet (0.4 mg total) under the tongue every 5 (five) minutes as needed for chest pain. 20 tablet 0   NOVOLOG  FLEXPEN 100 UNIT/ML FlexPen Inject 5 Units into the skin 3 (three) times daily with meals.     Omega-3 Fatty Acids (FISH OIL) 1000 MG CAPS Take 1,000 mg by mouth daily.     OZEMPIC , 1 MG/DOSE, 4 MG/3ML SOPN Inject 1 mg into  the skin every Tuesday.     pregabalin  (LYRICA ) 75 MG capsule Take 75 mg by mouth 2 (two) times daily.     sertraline  (ZOLOFT ) 50 MG tablet Take 50 mg by mouth in the morning.     sucroferric oxyhydroxide (VELPHORO ) 500 MG chewable tablet Chew 1 tablet (500 mg total) by mouth 3 (three) times daily with meals. 90 tablet 0   tiZANidine (ZANAFLEX) 4 MG tablet Take 4 mg by mouth 2 (two) times daily as needed for muscle spasms.     traMADol  (ULTRAM ) 50 MG tablet Take 1 tablet (50 mg total) by mouth every 12 (twelve) hours as needed. 6 tablet 0   traZODone  (DESYREL ) 50 MG tablet Take 50 mg by mouth at bedtime.     TURMERIC CURCUMIN PO Take 1,200 mg by mouth daily at 6 (six) AM. 600 mg each Chew     No current facility-administered medications for this visit.   Facility-Administered Medications Ordered in Other Visits  Medication Dose Route Frequency Provider Last Rate Last Admin   technetium pyrophosphate Tc 29m injection 21.2 millicurie  21.2 millicurie Intravenous Once Hilty, Vinie BROCKS, MD        Review of Systems  Constitutional: Negative for unexpected weight change.       Increased appetite  HENT: HENT negative.  Eyes: Eyes negative.  GI: Gastrointestinal negative.  Musculoskeletal: Musculoskeletal negative.  Skin: Skin negative.  Hematologic: Hematologic/lymphatic negative.  Psychiatric: Psychiatric negative.        Objective:  Objective   Vitals:   12/22/23 0849  BP: (!) 153/73  Pulse: 66  Temp: 97.8 F (36.6 C)  SpO2: 96%  Weight: 228 lb (103.4 kg)  Height: 5' 3 (1.6 m)    Body mass index is 40.39 kg/m.  Physical Exam HENT:     Head: Normocephalic.     Nose: Nose normal.  Eyes:     Pupils: Pupils are equal, round, and reactive to light.  Abdominal:     Comments: Well-healed low midline incision  Musculoskeletal:        General: Normal range of motion.     Right lower leg: No edema.     Left lower leg: No edema.  Skin:    General: Skin is warm.     Capillary Refill: Capillary refill takes less than 2 seconds.  Neurological:     General: No focal deficit present.     Mental Status: She is alert.  Psychiatric:        Mood and Affect: Mood normal.     Data: No studies     Assessment/Plan:     63 year old female with end-stage renal disease dialyzing via left upper arm AV fistula.  Patient is very hopeful to have peritoneal dialysis.  Have discussed given her size and previous abdominal surgeries that she is not a great candidate for peritoneal dialysis but she has persistent to proceed.  We will plan for diagnostic laparoscopy with possible catheter placement.  We discussed the risks particularly of risk of injury to intra-abdominal structures given her previous surgeries and size.  We also discussed the risk of nonfunction of the catheter which would require an additional general anesthetic for removal.  She demonstrated very good understanding of all of this and agrees to proceed in the near future.     Penne Lonni [place] MD [facility]

## 2023-12-23 DIAGNOSIS — E113513 Type 2 diabetes mellitus with proliferative diabetic retinopathy with macular edema, bilateral: Secondary | ICD-10-CM | POA: Diagnosis not present

## 2023-12-23 DIAGNOSIS — H31093 Other chorioretinal scars, bilateral: Secondary | ICD-10-CM | POA: Diagnosis not present

## 2023-12-23 DIAGNOSIS — H43811 Vitreous degeneration, right eye: Secondary | ICD-10-CM | POA: Diagnosis not present

## 2023-12-23 DIAGNOSIS — H43391 Other vitreous opacities, right eye: Secondary | ICD-10-CM | POA: Diagnosis not present

## 2023-12-23 DIAGNOSIS — H35033 Hypertensive retinopathy, bilateral: Secondary | ICD-10-CM | POA: Diagnosis not present

## 2023-12-23 DIAGNOSIS — H35373 Puckering of macula, bilateral: Secondary | ICD-10-CM | POA: Diagnosis not present

## 2023-12-23 DIAGNOSIS — H20042 Secondary noninfectious iridocyclitis, left eye: Secondary | ICD-10-CM | POA: Diagnosis not present

## 2023-12-24 NOTE — Progress Notes (Signed)
 Per Isaiah Ruder, PA, patient will need cardiac clearance prior to procedure.  Left message at Dr. Claretta office and sent message to Alan Glance and Dr. Sheree.

## 2023-12-25 DIAGNOSIS — I132 Hypertensive heart and chronic kidney disease with heart failure and with stage 5 chronic kidney disease, or end stage renal disease: Secondary | ICD-10-CM | POA: Diagnosis not present

## 2023-12-25 DIAGNOSIS — E1142 Type 2 diabetes mellitus with diabetic polyneuropathy: Secondary | ICD-10-CM | POA: Diagnosis not present

## 2023-12-25 DIAGNOSIS — Z419 Encounter for procedure for purposes other than remedying health state, unspecified: Secondary | ICD-10-CM | POA: Diagnosis not present

## 2023-12-25 DIAGNOSIS — I272 Pulmonary hypertension, unspecified: Secondary | ICD-10-CM | POA: Diagnosis not present

## 2023-12-25 DIAGNOSIS — Z992 Dependence on renal dialysis: Secondary | ICD-10-CM | POA: Diagnosis not present

## 2023-12-25 DIAGNOSIS — E113519 Type 2 diabetes mellitus with proliferative diabetic retinopathy with macular edema, unspecified eye: Secondary | ICD-10-CM | POA: Diagnosis not present

## 2023-12-25 DIAGNOSIS — N186 End stage renal disease: Secondary | ICD-10-CM | POA: Diagnosis not present

## 2023-12-25 DIAGNOSIS — E1151 Type 2 diabetes mellitus with diabetic peripheral angiopathy without gangrene: Secondary | ICD-10-CM | POA: Diagnosis not present

## 2023-12-25 DIAGNOSIS — F321 Major depressive disorder, single episode, moderate: Secondary | ICD-10-CM | POA: Diagnosis not present

## 2023-12-25 DIAGNOSIS — I25119 Atherosclerotic heart disease of native coronary artery with unspecified angina pectoris: Secondary | ICD-10-CM | POA: Diagnosis not present

## 2023-12-25 DIAGNOSIS — I509 Heart failure, unspecified: Secondary | ICD-10-CM | POA: Diagnosis not present

## 2023-12-25 DIAGNOSIS — E1159 Type 2 diabetes mellitus with other circulatory complications: Secondary | ICD-10-CM | POA: Diagnosis not present

## 2023-12-27 ENCOUNTER — Telehealth: Payer: Self-pay

## 2023-12-27 ENCOUNTER — Other Ambulatory Visit (HOSPITAL_COMMUNITY): Payer: Self-pay

## 2023-12-27 ENCOUNTER — Telehealth: Payer: Self-pay | Admitting: *Deleted

## 2023-12-27 NOTE — Telephone Encounter (Signed)
 Pharmacy Patient Advocate Encounter   Received notification from Fax that prior authorization for OZEMPIC  is required/requested.   Insurance verification completed.   The patient is insured through [facility] [place] .   Per test claim: PA required; PA submitted to above mentioned insurance via CoverMyMeds Key/confirmation #/EOC [id number] Status is pending

## 2023-12-27 NOTE — Telephone Encounter (Signed)
 Surgery on 7/15 canceled due to need for cardiac clearance.  Patient aware and understands.  L.Stockard to process Cardiac Clearance request.

## 2023-12-27 NOTE — Telephone Encounter (Signed)
   Pre-operative Risk Assessment    Patient Name: Maria Hudson  DOB: 06/14/1961 MRN: [id number]   Date of last office visit: 02/02/23 [facility] Date of next office visit: 01/05/24 DR. McLEAN    Request for Surgical Clearance    Procedure:  DIAGNOSTIC LAPAROSCOPY OF ABDOMEN W/POSSIBLE PERITONEAL DIALYSIS CATHETER PLACEMENT   Date of Surgery:  Clearance TBD                                Surgeon:  DR. PENNE COLORADO Surgeon's Group or Practice Name:  [facility] Phone number:  [phone] Fax number:  [phone]   Type of Clearance Requested:   - Medical  PER CLEARANCE FORM ASKING FOR CARDIAC CLEARANCE; NO MEDICATIONS LISTED AS NEEDING TO BE HELD   Type of Anesthesia:  Not Indicated   Additional requests/questions:    Bonney Niels Jest   12/27/2023, 4:38 PM

## 2023-12-27 NOTE — Telephone Encounter (Signed)
 Pharmacy Patient Advocate Encounter  Received notification from [facility] Medicaid that Prior Authorization for OZEMPIC  has been APPROVED from 12/13/23 to 12/26/24

## 2023-12-28 ENCOUNTER — Encounter (HOSPITAL_COMMUNITY): Admission: RE | Payer: Self-pay | Source: Home / Self Care

## 2023-12-28 ENCOUNTER — Ambulatory Visit (HOSPITAL_COMMUNITY): Admission: RE | Admit: 2023-12-28 | Source: Home / Self Care | Admitting: Vascular Surgery

## 2023-12-28 SURGERY — LAPAROSCOPY, DIAGNOSTIC
Anesthesia: Choice

## 2023-12-28 NOTE — Telephone Encounter (Signed)
   Name: Maria Hudson  DOB: Nov 01, 1960  MRN: [id number]  Primary Cardiologist: Shelda Bruckner, MD  Chart reviewed as part of pre-operative protocol coverage. The patient has an upcoming visit scheduled with Dr. Dr. Rolan on 01/05/24 at which time clearance can be addressed in case there are any issues that would impact surgical recommendations.  Diagnostic laparoscopy of abdomen with possible peritoneal dialysis catheter placement is not scheduled as below. I added preop FYI to appointment note so that provider is aware to address at time of outpatient visit.  Per office protocol the cardiology provider should forward their finalized clearance decision and recommendations regarding antiplatelet therapy to the requesting party below.    I will route this message as FYI to requesting party and remove this message from the preop box as separate preop APP input not needed at this time.   Please call with any questions.  Amberlee Garvey D Iker Nuttall, NP  12/28/2023, 9:14 AM

## 2023-12-29 ENCOUNTER — Ambulatory Visit: Admitting: Vascular Surgery

## 2024-01-05 ENCOUNTER — Ambulatory Visit (HOSPITAL_COMMUNITY)
Admission: RE | Admit: 2024-01-05 | Discharge: 2024-01-05 | Disposition: A | Discharge status: Home / Self Care | Source: Ambulatory Visit | Attending: Cardiology | Admitting: Cardiology

## 2024-01-05 ENCOUNTER — Ambulatory Visit (HOSPITAL_COMMUNITY): Payer: Self-pay | Admitting: Cardiology

## 2024-01-05 ENCOUNTER — Inpatient Hospital Stay (HOSPITAL_COMMUNITY)
Admission: RE | Admit: 2024-01-05 | Discharge: 2024-01-05 | Disposition: A | Discharge status: Home / Self Care | Source: Ambulatory Visit | Attending: Cardiology | Admitting: Cardiology

## 2024-01-05 ENCOUNTER — Other Ambulatory Visit (HOSPITAL_COMMUNITY): Payer: Self-pay | Admitting: Cardiology

## 2024-01-05 VITALS — BP 122/60 | HR 76 | Wt 231.8 lb

## 2024-01-05 DIAGNOSIS — Z794 Long term (current) use of insulin: Secondary | ICD-10-CM | POA: Insufficient documentation

## 2024-01-05 DIAGNOSIS — I5032 Chronic diastolic (congestive) heart failure: Secondary | ICD-10-CM | POA: Insufficient documentation

## 2024-01-05 DIAGNOSIS — I2510 Atherosclerotic heart disease of native coronary artery without angina pectoris: Secondary | ICD-10-CM | POA: Insufficient documentation

## 2024-01-05 DIAGNOSIS — G4733 Obstructive sleep apnea (adult) (pediatric): Secondary | ICD-10-CM | POA: Insufficient documentation

## 2024-01-05 DIAGNOSIS — Z7982 Long term (current) use of aspirin: Secondary | ICD-10-CM | POA: Insufficient documentation

## 2024-01-05 DIAGNOSIS — E1122 Type 2 diabetes mellitus with diabetic chronic kidney disease: Secondary | ICD-10-CM | POA: Diagnosis not present

## 2024-01-05 DIAGNOSIS — Z79899 Other long term (current) drug therapy: Secondary | ICD-10-CM | POA: Insufficient documentation

## 2024-01-05 DIAGNOSIS — Z992 Dependence on renal dialysis: Secondary | ICD-10-CM | POA: Insufficient documentation

## 2024-01-05 DIAGNOSIS — I951 Orthostatic hypotension: Secondary | ICD-10-CM | POA: Diagnosis not present

## 2024-01-05 DIAGNOSIS — Z7985 Long-term (current) use of injectable non-insulin antidiabetic drugs: Secondary | ICD-10-CM | POA: Diagnosis not present

## 2024-01-05 DIAGNOSIS — M79602 Pain in left arm: Secondary | ICD-10-CM | POA: Insufficient documentation

## 2024-01-05 DIAGNOSIS — R002 Palpitations: Secondary | ICD-10-CM | POA: Diagnosis not present

## 2024-01-05 DIAGNOSIS — R079 Chest pain, unspecified: Secondary | ICD-10-CM | POA: Insufficient documentation

## 2024-01-05 DIAGNOSIS — R072 Precordial pain: Secondary | ICD-10-CM | POA: Diagnosis not present

## 2024-01-05 DIAGNOSIS — I25119 Atherosclerotic heart disease of native coronary artery with unspecified angina pectoris: Secondary | ICD-10-CM | POA: Diagnosis not present

## 2024-01-05 DIAGNOSIS — Z5971 Insufficient health insurance coverage: Secondary | ICD-10-CM | POA: Diagnosis not present

## 2024-01-05 DIAGNOSIS — I2583 Coronary atherosclerosis due to lipid rich plaque: Secondary | ICD-10-CM | POA: Diagnosis not present

## 2024-01-05 DIAGNOSIS — N186 End stage renal disease: Secondary | ICD-10-CM | POA: Diagnosis not present

## 2024-01-05 DIAGNOSIS — I132 Hypertensive heart and chronic kidney disease with heart failure and with stage 5 chronic kidney disease, or end stage renal disease: Secondary | ICD-10-CM | POA: Diagnosis not present

## 2024-01-05 DIAGNOSIS — Z6841 Body Mass Index (BMI) 40.0 and over, adult: Secondary | ICD-10-CM | POA: Diagnosis not present

## 2024-01-05 DIAGNOSIS — E669 Obesity, unspecified: Secondary | ICD-10-CM | POA: Insufficient documentation

## 2024-01-05 LAB — LIPID PANEL
Cholesterol: 217 mg/dL — ABNORMAL HIGH (ref 0–200)
HDL: 64 mg/dL (ref >40)
LDL Cholesterol: 132 mg/dL — ABNORMAL HIGH (ref 0–99)
Total CHOL/HDL Ratio: 3.4 ratio
Triglycerides: 106 mg/dL (ref <150)
VLDL: 21 mg/dL (ref 0–40)

## 2024-01-05 NOTE — Progress Notes (Signed)
 Zio patch placed onto patient.  All instructions and information reviewed with patient, they verbalize understanding with no questions.

## 2024-01-05 NOTE — Patient Instructions (Signed)
 There has been no changes to your medications.  Labs done today, your results will be available in MyChart, we will contact you for abnormal readings.  Your provider has recommended that  you wear a Zio Patch for 14 days.  This monitor will record your heart rhythm for our review.  IF you have any symptoms while wearing the monitor please press the button.  If you have any issues with the patch or you notice a red or orange light on it please call the company at [phone].  Once you remove the patch please mail it back to the company as soon as possible so we can get the results.     Please report to Radiology at the [facility] Main Entrance 30 minutes early for your test.  [address] [place], [place] [address]                         OR   Please report to Radiology at [facility] Main Entrance, medical mall, 30 mins prior to your test.  [address]  [place], [place]  How to Prepare for Your Cardiac PET/CT Stress Test:  Nothing to eat or drink, except water , 3 hours prior to arrival time.  NO caffeine/decaffeinated products, or chocolate 12 hours prior to arrival. (Please note decaffeinated beverages (teas/coffees) still contain caffeine).  If you have caffeine within 12 hours prior, the test will need to be rescheduled.  Medication instructions: Do not take erectile dysfunction medications for 72 hours prior to test (sildenafil, tadalafil) Do not take nitrates (isosorbide  mononitrate, Ranexa) the day before or day of test Do not take tamsulosin the day before or morning of test Hold theophylline containing medications for 12 hours. Hold Dipyridamole 48 hours prior to the test.  Diabetic Preparation: If able to eat breakfast prior to 3 hour fasting, you may take all medications, including your insulin . Do not worry if you miss your breakfast dose of insulin  - start at your next meal. If you do not eat prior to 3 hour fast-Hold all  diabetes (oral and insulin ) medications. Patients who wear a continuous glucose monitor MUST remove the device prior to scanning.  You may take your remaining medications with water .  NO perfume, cologne or lotion on chest or abdomen area. FEMALES - Please avoid wearing dresses to this appointment.  Total time is 1 to 2 hours; you may want to bring reading material for the waiting time.  IF YOU THINK YOU MAY BE PREGNANT, OR ARE NURSING PLEASE INFORM THE TECHNOLOGIST.  In preparation for your appointment, medication and supplies will be purchased.  Appointment availability is limited, so if you need to cancel or reschedule, please call the Radiology Department Scheduler at [phone] 24 hours in advance to avoid a cancellation fee of $100.00  What to Expect When you Arrive:  Once you arrive and check in for your appointment, you will be taken to a preparation room within the Radiology Department.  A technologist or Nurse will obtain your medical history, verify that you are correctly prepped for the exam, and explain the procedure.  Afterwards, an IV will be started in your arm and electrodes will be placed on your skin for EKG monitoring during the stress portion of the exam. Then you will be escorted to the PET/CT scanner.  There, staff will get you positioned on the scanner and obtain a blood pressure and EKG.  During the exam,  you will continue to be connected to the EKG and blood pressure machines.  A small, safe amount of a radioactive tracer will be injected in your IV to obtain a series of pictures of your heart along with an injection of a stress agent.    After your Exam:  It is recommended that you eat a meal and drink a caffeinated beverage to counter act any effects of the stress agent.  Drink plenty of fluids for the remainder of the day and urinate frequently for the first couple of hours after the exam.  Your doctor will inform you of your test results within 7-10 business  days.  For more information and frequently asked questions, please visit our website: [email or web address]  For questions about your test or how to prepare for your test, please call: Cardiac Imaging Nurse Navigators Office: [phone]  Your physician recommends that you schedule a follow-up appointment in: 6 months   If you have any questions or concerns before your next appointment please send us  a message through mychart or call our office at [phone].    TO LEAVE A MESSAGE FOR THE NURSE SELECT OPTION 2, PLEASE LEAVE A MESSAGE INCLUDING: YOUR NAME DATE OF BIRTH CALL BACK NUMBER REASON FOR CALL**this is important as we prioritize the call backs  YOU WILL RECEIVE A CALL BACK THE SAME DAY AS LONG AS YOU CALL BEFORE 4:00 PM  At the [facility], you and your health needs are our priority. As part of our continuing mission to provide you with exceptional heart care, we have created designated Provider Care Teams. These Care Teams include your primary Cardiologist (physician) and Advanced Practice Providers (APPs- Physician Assistants and Nurse Practitioners) who all work together to provide you with the care you need, when you need it.   You may see any of the following providers on your designated Care Team at your next follow up: Dr Toribio Fuel Dr Ezra Shuck Dr. Ria Commander Dr. Morene Brownie Amy Lenetta, NP Caffie Shed, [place] [facility] [place], [place] Beckey Coe, NP [place] Lee, NP Ellouise Class, NP Tinnie Redman, PharmD Jaun Bash, PharmD   Please be sure to bring in all your medications bottles to every appointment.    Thank you for choosing [facility]

## 2024-01-05 NOTE — Progress Notes (Signed)
 PCP: Shelda Atlas, MD Nephrology: Dr. Jerrye HF Cardiology: Dr. Rolan  Chief complaint: Chest pain  63 y.o. with history of supine hypertension with orthostatic hypotension, CKD stage 4, CAD, and chronic diastolic CHF was referred from [facility] clinic for evaluation of CHF.    Patient had MI in 05/2013. LHC showed 20% LAD, 50 to 60% left circumflex, and 100% RPDA stenosis with left-to-right collaterals. CAD managed medically.    She reports a long h/o diastolic heart failure and frequent hospitalizations. She moved to [place] from [facility], had been followed by [facility]. Has struggled w/ volume control and readjustment of diuretics over the years. She reports previously being prescribed Farxiga  but had to stop b/c her insurance would not cover it. Her last echo at [facility] was 7/21. LVEF was 55%, RV normal. RVSP elevated at 60 mmHg. PYP at [facility] was negative for amyloid (grade 1, H/CL 1.21). Serum IFE not suggestive of monoclonal gammopathy. Urine IFE and immunoglobin free light chains were elevated (lg FLCK 9.24, Ig FLCL 3.75, K/L ratio 2.46. Repeat testing recommended 6 months later, but do not see that this was completed).    Referred to HTN clinic 9/22 for uncontrolled HTN. Thyroid  function, renin, and aldosterone were normal, as were catecholamines and metanephrines. Her PTH was elevated at 134 but calcium  was normal. Renal artery doppler study was limited. There was no evidence of right RAS however the left renal artery was not well visualized due to the presence of bowel gas.  Echo was repeated 9/22 showing normal LVEF 55-60%, G1DD and normal RV.    She was admitted again in 11/22 with chest pain, AKI, and orthostatic hypotension.  Torsemide  was stopped.  HS-TnI was negative, suspect musculoskeletal chest pain.    PYP study repeated in 12/22 was not suggestive of cardiac amyloidosis.   She had an AV fistula placed with steadily worsening renal failure.  She is now doing home HD.   Echo 5/24  showed EF 60-65%, moderate LVH, G1DD, normal RV, RVSP 35 mmHg.   PYP scan in 9/24 was grade I, not suggestive of ATTR cardiac amyloidosis.   Today she returns for HF follow up. She was seen in the ER in 7/25 with left arm pain/dyspnea.  She has been having episodes of left arm pain x 2 months.  Not exertional.  She describes the discomfort as a weird feeling.  Sometimes this is associated with palpitations, slow heart beats.  No lightheadedness or syncope with these events.  Workup in the ER was unremarkable.  She was sent home with recommendation to get a stress test.  This has not been scheduled. She says that she is always short of breath, but she generally does ok with ADLs.   Patient wants to start peritoneal dialysis.  She needs abdominal surgery for catheter placement. This will be challenging given prior abdominal surgeries.   ECG (personally reviewed): NSR, poor RWP  Labs (11/22): K 3.8, creatinine 3.6 => 2.62 Labs (2/23): K 3.7, creatinine 3.31  Labs (4/23): K 4.5, creatinine 2.69, LDL 78, HDL 58 Labs (6/23): K 4.2, creatinine 2.65 Labs (9/23): K 3.6, creatinine 2.25 Labs (11/23): K 4.1, creatinine 2.52, BNP 81 Labs (4/24): K 4.1, creatinine 2.92, LDL 54  PMH: 1. HTN: Urine catecholamines and metanephrines normal level, aldosterone normal level.  Complicated by orthostatic hypotension.   - Renal artery dopplers (11/22): No right renal artery stenosis, unable to visualize left renal artery due to overlying bowel gas.  2. Orthostatic hypotension 3. ESRD: Home  HD.  4. Type 2 diabetes 5. OSA: Uses CPAP 6. CAD: LHC in 2014 with totally occluded RCA with collaterals, medical management.  7. Chronic diastolic CHF: Echo (7/21) with EF 55%, normal RV, PASP 60 mmHg.  - PYP scan 11/20 at [facility] was negative with grade 1, H/CL 1.21.  - Echo (9/22): EF 55-60%, mild LVH, normal RV, IVC normal.  - RHC (11/22): mean RA 1, PA 41/10 mean 24, mean PCWP 12, CI 3.  - PYP scan (12/22): grade 1,  H/CL 1.  - Echo (5/24) EF 60-65%,  moderate LVH, G1DD, normal RV, RVSP 35 - PYP scan in 9/24 was grade I, not suggestive of ATTR cardiac amyloidosis.  8. Gout 9. COVID-19 2/23  Social History   Socioeconomic History   Marital status: Divorced    Spouse name: Not on file   Number of children: 2   Years of education: Not on file   Highest education level: Some college, no degree  Occupational History   Occupation: retired   Occupation: disabled  Tobacco Use   Smoking status: Never    Passive exposure: Never   Smokeless tobacco: Never  Vaping Use   Vaping status: Never Used  Substance and Sexual Activity   Alcohol use: Not Currently   Drug use: Never   Sexual activity: Not Currently    Birth control/protection: Post-menopausal, Surgical  Other Topics Concern   Not on file  Social History Narrative   Not on file   Social Drivers of Health   Financial Resource Strain: Low Risk  (05/31/2023)   Received [facility]   Overall Financial Resource Strain (CARDIA)    Difficulty of Paying Living Expenses: Not very hard  Food Insecurity: No Food Insecurity (10/12/2023)   Hunger Vital Sign    Worried About Running Out of Food in the Last Year: Never true    Ran Out of Food in the Last Year: Never true  Transportation Needs: No Transportation Needs (10/12/2023)   PRAPARE - Administrator, Civil Service (Medical): No    Lack of Transportation (Non-Medical): No  Physical Activity: Inactive (08/23/2023)   Exercise Vital Sign    Days of Exercise per Week: 0 days    Minutes of Exercise per Session: 0 min  Stress: No Stress Concern Present (08/23/2023)   [employer] of Occupational Health - Occupational Stress Questionnaire    Feeling of Stress : Only a little  Social Connections: Moderately Isolated (08/17/2023)   Social Connection and Isolation Panel    Frequency of Communication with Friends and Family: More than three times a week    Frequency of Social  Gatherings with Friends and Family: More than three times a week    Attends Religious Services: More than 4 times per year    Active Member of [employer] or Organizations: No    Attends Banker Meetings: Never    Marital Status: Divorced  Catering manager Violence: Not At Risk (10/12/2023)   Humiliation, Afraid, Rape, and Kick questionnaire    Fear of Current or Ex-Partner: No    Emotionally Abused: No    Physically Abused: No    Sexually Abused: No   Family History  Problem Relation Age of Onset   Hypertension Mother    Stroke Mother    Diabetes Mother    Other Mother        enlarged heart   Hypertension Father    Hypertension Sister    Diabetes Sister  CAD Sister        enlarged heart   Kidney disease Sister    Diabetes Paternal Grandmother    Hypertension Daughter    Diabetes Daughter    Hypertension Son    Diabetes Son    Breast cancer Paternal Aunt    ROS: All systems reviewed and negative except as per HPI.   Current Outpatient Medications  Medication Sig Dispense Refill   Accu-Chek Softclix Lancets lancets 3 (three) times daily.     acetaminophen  (TYLENOL ) 500 MG tablet Take 1,000 mg by mouth every 6 (six) hours as needed for moderate pain or headache.     allopurinol  (ZYLOPRIM ) 100 MG tablet Take 100 mg by mouth in the morning.     amLODipine  (NORVASC ) 10 MG tablet Take 10 mg by mouth daily.     aspirin  EC 81 MG tablet Take 81 mg by mouth in the morning. Swallow whole.     atorvastatin  (LIPITOR) 40 MG tablet Take 1 tablet (40 mg total) by mouth every evening. 90 tablet 3   buPROPion  (WELLBUTRIN ) 100 MG tablet Take 100 mg by mouth 2 (two) times daily.     calcium  acetate (PHOSLO) 667 MG capsule Take 1,334 mg by mouth 2 (two) times daily with a meal.     carvedilol  (COREG ) 6.25 MG tablet Take 6.25 mg by mouth 2 (two) times daily with a meal.     cholecalciferol (VITAMIN D3) 25 MCG (1000 UNIT) tablet Take 2,000 Units by mouth daily.     Continuous Blood  Gluc Transmit (DEXCOM G6 TRANSMITTER) MISC Use one every 3 month     ferrous sulfate  325 (65 FE) MG tablet Take 325 mg by mouth 2 (two) times daily.     fluticasone  (FLONASE ) 50 MCG/ACT nasal spray Place 1 spray into both nostrils daily as needed for allergies or rhinitis.     insulin  glargine (LANTUS ) 100 unit/mL SOPN Inject 0-10 Units into the skin at bedtime as needed (if blood gluose is above 130 take 2 units 150 and above 10 units).     isosorbide  mononitrate (IMDUR ) 120 MG 24 hr tablet Take 120 mg by mouth at bedtime.     LINZESS  145 MCG CAPS capsule TAKE 1 CAPSULE BY MOUTH DAILY BEFORE BREAKFAST. 30 capsule 11   loratadine  (CLARITIN ) 10 MG tablet Take 10 mg by mouth in the morning.     Multiple Vitamin (MULTIVITAMIN ADULT) TABS Take 1 tablet by mouth daily with lunch.     nitroGLYCERIN  (NITROSTAT ) 0.4 MG SL tablet Place 1 tablet (0.4 mg total) under the tongue every 5 (five) minutes as needed for chest pain. 20 tablet 0   NOVOLOG  FLEXPEN 100 UNIT/ML FlexPen Inject 5-10 Units into the skin 2 (two) times daily with a meal.     Omega-3 Fatty Acids (FISH OIL) 1000 MG CAPS Take 1,000 mg by mouth daily.     OZEMPIC , 1 MG/DOSE, 4 MG/3ML SOPN Inject 1 mg into the skin every Tuesday.     pregabalin  (LYRICA ) 75 MG capsule Take 75 mg by mouth 2 (two) times daily.     sertraline  (ZOLOFT ) 50 MG tablet Take 50 mg by mouth in the morning.     torsemide  (DEMADEX ) 20 MG tablet Take 40 mg by mouth See admin instructions. Take 40 mg by mouth on Wednesday, Saturday and Sunday     traMADol  (ULTRAM ) 50 MG tablet Take 1 tablet (50 mg total) by mouth every 12 (twelve) hours as needed. 6 tablet 0  traZODone  (DESYREL ) 50 MG tablet Take 50 mg by mouth at bedtime.     TURMERIC CURCUMIN PO Take 1,200 mg by mouth daily at 6 (six) AM.     No current facility-administered medications for this encounter.   Facility-Administered Medications Ordered in Other Encounters  Medication Dose Route Frequency Provider Last Rate  Last Admin   technetium pyrophosphate Tc 22m injection 21.2 millicurie  21.2 millicurie Intravenous Once Hilty, Vinie BROCKS, MD       Wt Readings from Last 3 Encounters:  01/05/24 105.1 kg (231 lb 12.8 oz)  12/22/23 103.4 kg (228 lb)  10/14/23 95.9 kg (211 lb 6.7 oz)   BP 122/60   Pulse 76   Wt 105.1 kg (231 lb 12.8 oz)   SpO2 92%   BMI 41.06 kg/m  General: NAD Neck: No JVD, no thyromegaly or thyroid  nodule.  Lungs: Clear to auscultation bilaterally with normal respiratory effort. CV: Nondisplaced PMI.  Heart regular S1/S2, no S3/S4, 3/6 murmur across precordium that seems to radiate from her left arm fistula.  No peripheral edema.  No carotid bruit.  Normal pedal pulses.  Abdomen: Soft, nontender, no hepatosplenomegaly, no distention.  Skin: Intact without lesions or rashes.  Neurologic: Alert and oriented x 3.  Psych: Normal affect. Extremities: No clubbing or cyanosis.  HEENT: Normal.   Assessment/Plan: 1. HTN: BP now controlled on HD. BP meds have been cut back.  - Continue amlodipine  10 mg daily.  - Continue Coreg  6.25 mg bid.  2. Chronic diastolic CHF: Echo in 9/22 with EF 55-60%, normal RV. RHC in 11/22 with optimized filling pressures.  PYP scans (12/22, 9/24) and myeloma studies were not suggestive of cardiac amyloidosis. Echo 5/24 showed EF 60-65%, moderate LVH. Volume controlled now by HD. She gets torsemide  on non-HD days.  3. ESRD: She is getting home HD.  Plan for PD catheter placement, will require abdominal surgery.  - She is going to get a stress test (see below).  If this is low risk, I think she can go for PD catheter placement with reasonable risk.   4. CAD: H/o occluded PDA with collaterals.  She has had left arm pain and a weird feeling in her chest that could possibly be due to palpitations/PVCs. Workup in ER earlier this month was unremarkable. It is possible that the left arm pain is related to her left arm fistula.  - Continue ASA 81 daily.  - Continue  atorvastatin , check lipids today.  - I will arrange for cardiac PET to assess for ischemia. - I am also going to arrange for Zio monitor x 2 wks to assess for arrhythmias.  5. Obesity: Body mass index is 41.06 kg/m. - She is on Ozempic . 6. OSA: Continue CPAP.  Follow up in 6 months with APP.   I spent 32 minutes reviewing records, interviewing/examining patient, and managing orders.    Ezra Shuck  01/05/2024

## 2024-01-06 MED ORDER — ATORVASTATIN CALCIUM 80 MG PO TABS: 80.0000 mg | ORAL_TABLET | Freq: Every evening | ORAL | 1 refills | Status: DC

## 2024-01-06 NOTE — Telephone Encounter (Signed)
-----   Message from Ezra Shuck sent at 01/05/2024  4:30 PM EDT ----- History of CAD, would like to see LDL < 55.  Increase atorvastatin  to 80 mg daily with lipids/LFTs in 2 months.  ----- Message ----- From: Rebecka, [facility] [place] Sent: 01/05/2024  10:10 AM EDT To: Ezra GORMAN Shuck, MD

## 2024-01-13 DIAGNOSIS — N186 End stage renal disease: Secondary | ICD-10-CM | POA: Diagnosis not present

## 2024-01-13 DIAGNOSIS — Z992 Dependence on renal dialysis: Secondary | ICD-10-CM | POA: Diagnosis not present

## 2024-01-13 DIAGNOSIS — E1122 Type 2 diabetes mellitus with diabetic chronic kidney disease: Secondary | ICD-10-CM | POA: Diagnosis not present

## 2024-01-15 ENCOUNTER — Encounter: Payer: Self-pay | Admitting: Gastroenterology

## 2024-01-20 ENCOUNTER — Telehealth (HOSPITAL_COMMUNITY): Payer: Self-pay

## 2024-01-20 NOTE — Telephone Encounter (Signed)
-----   Message from Olam ORN sent at 12/20/2023  8:35 AM EDT ----- We received an order for a Myoview from your Provider.  Will you please check to see if a Prior Authorization is needed for the following CPT codes:78452,93017,A9502 and J2785.  Once PA# is completed  please document on  the order in the comment line and we will be glad to call and schedule.  The appointment will not be able to be made until the PA# is obtained. Thank you so much for your help with this.

## 2024-01-21 ENCOUNTER — Encounter (HOSPITAL_COMMUNITY)
Admission: RE | Disposition: A | Discharge status: Home / Self Care | Payer: Self-pay | Source: Home / Self Care | Attending: Surgery

## 2024-01-21 ENCOUNTER — Ambulatory Visit (HOSPITAL_COMMUNITY)
Admission: RE | Admit: 2024-01-21 | Discharge: 2024-01-21 | Disposition: A | Discharge status: Home / Self Care | Attending: Surgery | Admitting: Surgery

## 2024-01-21 ENCOUNTER — Other Ambulatory Visit: Payer: Self-pay

## 2024-01-21 DIAGNOSIS — Z992 Dependence on renal dialysis: Secondary | ICD-10-CM | POA: Insufficient documentation

## 2024-01-21 DIAGNOSIS — T82858A Stenosis of vascular prosthetic devices, implants and grafts, initial encounter: Secondary | ICD-10-CM | POA: Diagnosis not present

## 2024-01-21 DIAGNOSIS — N186 End stage renal disease: Secondary | ICD-10-CM | POA: Diagnosis not present

## 2024-01-21 DIAGNOSIS — Y832 Surgical operation with anastomosis, bypass or graft as the cause of abnormal reaction of the patient, or of later complication, without mention of misadventure at the time of the procedure: Secondary | ICD-10-CM | POA: Insufficient documentation

## 2024-01-21 HISTORY — PX: A/V SHUNT INTERVENTION: CATH118220

## 2024-01-21 HISTORY — PX: VENOUS ANGIOPLASTY: CATH118376

## 2024-01-21 LAB — GLUCOSE, CAPILLARY: Glucose-Capillary: 135 mg/dL — ABNORMAL HIGH (ref 70–99)

## 2024-01-21 SURGERY — A/V SHUNT INTERVENTION
Anesthesia: LOCAL | Site: Arm Upper | Laterality: Left

## 2024-01-21 MED ORDER — LIDOCAINE HCL (PF) 1 % IJ SOLN
INTRAMUSCULAR | Status: DC | PRN
  Administered 2024-01-21: 2 mL via SUBCUTANEOUS

## 2024-01-21 MED ORDER — IODIXANOL 320 MG/ML IV SOLN
INTRAVENOUS | Status: DC | PRN
  Administered 2024-01-21: 20 mL via INTRAVENOUS

## 2024-01-21 MED ORDER — LIDOCAINE HCL (PF) 1 % IJ SOLN
INTRAMUSCULAR | Status: AC
  Filled 2024-01-21: qty 30

## 2024-01-21 MED ORDER — HEPARIN (PORCINE) IN NACL 1000-0.9 UT/500ML-% IV SOLN
INTRAVENOUS | Status: DC | PRN
  Administered 2024-01-21: 500 mL

## 2024-01-21 SURGICAL SUPPLY — 11 items
BALLOON MUSTANG 10X80X75 (BALLOONS) IMPLANT
BALLOON MUSTANG 9X80X75 (BALLOONS) IMPLANT
GLIDEWIRE ADV .035X180CM (WIRE) IMPLANT
KIT ENCORE 26 ADVANTAGE (KITS) IMPLANT
KIT MICROPUNCTURE NIT STIFF (SHEATH) IMPLANT
MAT PREVALON FULL STRYKER (MISCELLANEOUS) IMPLANT
SHEATH PINNACLE R/O II 6F 4CM (SHEATH) IMPLANT
SHEATH PROBE COVER 6X72 (BAG) IMPLANT
STOPCOCK MORSE 400PSI 3WAY (MISCELLANEOUS) IMPLANT
TRAY PV CATH (CUSTOM PROCEDURE TRAY) ×2 IMPLANT
TUBING CIL FLEX 10 FLL-RA (TUBING) IMPLANT

## 2024-01-21 NOTE — H&P (Signed)
   Patient name: Maria Hudson MRN: [id number] DOB: 07-07-1960 Sex: female  REASON FOR VISIT:    Dialysis access  HISTORY OF PRESENT ILLNESS:   Maria Hudson is a 63 y.o. female who is status post left brachiocephalic fistula 7976 which had to be revised and elevated in 2025.  She has been having issues with pulsatility and bleeding.  She underwent a fistulogram in July 2025 and was found to have a mid segment stenosis which was treated with primary angioplasty.  She continues to have issues with bleeding and pulsatility and is referred back for fistulogram.  CURRENT MEDICATIONS:    No current facility-administered medications for this encounter.   Facility-Administered Medications Ordered in Other Encounters  Medication Dose Route Frequency Provider Last Rate Last Admin   technetium pyrophosphate Tc 8m injection 21.2 millicurie  21.2 millicurie Intravenous Once Hilty, Vinie BROCKS, MD        REVIEW OF SYSTEMS:   [X]  denotes positive finding, [ ]  denotes negative finding Cardiac  Comments:  Chest pain or chest pressure:    Shortness of breath upon exertion:    Short of breath when lying flat:    Irregular heart rhythm:    Constitutional    Fever or chills:      PHYSICAL EXAM:   There were no vitals filed for this visit.  GENERAL: The patient is a well-nourished female, in no acute distress. The vital signs are documented above. CARDIOVASCULAR: There is a regular rate and rhythm. PULMONARY: Non-labored respirations Palpable thrill within fistula  STUDIES:       MEDICAL ISSUES:   ESRD: We discussed proceeding with fistulogram.  Details of the procedure discussed with patient.  All questions were answered.  Malvina Serene CLORE, MD, FACS [facility] [phone] Pager [phone]

## 2024-01-21 NOTE — Op Note (Signed)
    Patient name: Maria Hudson MRN: [id number] DOB: Feb 09, 1961 Sex: female  01/21/2024 Pre-operative Diagnosis: ESRD Post-operative diagnosis:  Same Surgeon:  Malvina New Procedure Performed:  1.  Ultrasound-guided access, left cephalic vein fistula  2.  Fistulogram  3.  Venoplasty, left cephalic vein    Indications: This is a 63 year old female on dialysis who has had issues with bleeding.  She comes today for fistulogram.  Procedure:  The patient was identified in the holding area and taken to room 8.  The patient was then placed supine on the table and prepped and draped in the usual sterile fashion.  A time out was called.  ultrasound was used to evaluate the fistula.  The vein was patent and compressible.  A digital ultrasound image was acquired.  The fistula was then accessed under ultrasound guidance using a micropuncture needle.  An 018 wire was then asvanced without resistance and a micropuncture sheath was placed.  Contrast injections were then performed through the sheath.  Findings: No evidence of central venous stenosis.  The rectal venous anastomosis was widely patent.  There were tandem lesions in the midportion of the fistula that had a greater than 50% stenosis.  I initially treated this using an 8 x 80 balloon at 12 atm.  On completion imaging there was still residual stenosis and so I upsized to a 10 x 80 balloon.  Following intervention to 12 atm follow-up imaging showed residual stenosis less than 20%.  I satisfied with these results.  Wires and balloons were removed.  The sheath was withdrawn and a Monocryl was used for closure.    Impression:  #1  Successful balloon venoplasty of tandem stenoses in the midportion of the fistula using a 10 mm balloon  #2  Fistula remains amenable to future interventions    V. Malvina New, M.D., [facility] [place] Office: [phone] Pager:  [phone]

## 2024-01-24 ENCOUNTER — Encounter (HOSPITAL_COMMUNITY): Payer: Self-pay | Admitting: Surgery

## 2024-01-25 DIAGNOSIS — Z419 Encounter for procedure for purposes other than remedying health state, unspecified: Secondary | ICD-10-CM | POA: Diagnosis not present

## 2024-01-31 ENCOUNTER — Ambulatory Visit (HOSPITAL_COMMUNITY): Payer: Self-pay | Admitting: Cardiology

## 2024-01-31 NOTE — Telephone Encounter (Addendum)
 Spoke to patient about monitor results  ----- Message from Ezra Shuck sent at 01/31/2024  7:30 AM EDT ----- No worrisome arrhythmias ----- Message ----- From: Shuck Ezra RAMAN, MD Sent: 01/30/2024  11:08 AM EDT To: Ezra RAMAN Shuck, MD

## 2024-02-02 ENCOUNTER — Other Ambulatory Visit: Payer: Self-pay

## 2024-02-02 DIAGNOSIS — N186 End stage renal disease: Secondary | ICD-10-CM

## 2024-02-07 ENCOUNTER — Encounter (HOSPITAL_COMMUNITY): Payer: Self-pay | Admitting: Vascular Surgery

## 2024-02-07 ENCOUNTER — Encounter (HOSPITAL_COMMUNITY): Payer: Self-pay | Admitting: Physician Assistant

## 2024-02-07 NOTE — Progress Notes (Signed)
 SDW call attempt.  States she needs to reschedule as she has no ride. Message sent to Alan Glance, RN

## 2024-02-09 DIAGNOSIS — I10 Essential (primary) hypertension: Secondary | ICD-10-CM | POA: Diagnosis not present

## 2024-02-09 DIAGNOSIS — E559 Vitamin D deficiency, unspecified: Secondary | ICD-10-CM | POA: Diagnosis not present

## 2024-02-09 DIAGNOSIS — Z0000 Encounter for general adult medical examination without abnormal findings: Secondary | ICD-10-CM | POA: Diagnosis not present

## 2024-02-09 DIAGNOSIS — I509 Heart failure, unspecified: Secondary | ICD-10-CM | POA: Diagnosis not present

## 2024-02-09 DIAGNOSIS — E1122 Type 2 diabetes mellitus with diabetic chronic kidney disease: Secondary | ICD-10-CM | POA: Diagnosis not present

## 2024-02-09 DIAGNOSIS — M10062 Idiopathic gout, left knee: Secondary | ICD-10-CM | POA: Diagnosis not present

## 2024-02-09 DIAGNOSIS — N186 End stage renal disease: Secondary | ICD-10-CM | POA: Diagnosis not present

## 2024-02-13 DIAGNOSIS — E1122 Type 2 diabetes mellitus with diabetic chronic kidney disease: Secondary | ICD-10-CM | POA: Diagnosis not present

## 2024-02-13 DIAGNOSIS — N186 End stage renal disease: Secondary | ICD-10-CM | POA: Diagnosis not present

## 2024-02-13 DIAGNOSIS — Z992 Dependence on renal dialysis: Secondary | ICD-10-CM | POA: Diagnosis not present

## 2024-02-24 ENCOUNTER — Encounter: Payer: Self-pay | Admitting: Gastroenterology

## 2024-02-25 DIAGNOSIS — Z419 Encounter for procedure for purposes other than remedying health state, unspecified: Secondary | ICD-10-CM | POA: Diagnosis not present

## 2024-02-28 ENCOUNTER — Encounter (HOSPITAL_COMMUNITY): Payer: Self-pay

## 2024-02-29 ENCOUNTER — Telehealth: Payer: Self-pay

## 2024-02-29 NOTE — Telephone Encounter (Signed)
 The patient called regarding pain during her dialysis. The patient was advised to follow up with dialysis clinic and they will refer as needed.  The patient verbalized understanding.

## 2024-03-01 ENCOUNTER — Telehealth: Payer: Self-pay

## 2024-03-01 ENCOUNTER — Encounter (HOSPITAL_COMMUNITY): Admission: RE | Admit: 2024-03-01 | Source: Ambulatory Visit

## 2024-03-01 NOTE — Telephone Encounter (Signed)
 Patient called this office to cancel upcoming laparoscopic PD cath placement with [facility].  States that insurance will not pay for required stress test for completion of cardiac clearance.  Patient has had recent chest pain/emergency room visits requiring clearance prior to surgery.

## 2024-03-07 ENCOUNTER — Encounter (HOSPITAL_COMMUNITY): Admission: RE | Payer: Self-pay | Source: Home / Self Care

## 2024-03-07 ENCOUNTER — Ambulatory Visit (HOSPITAL_COMMUNITY): Admission: RE | Admit: 2024-03-07 | Source: Home / Self Care | Admitting: Vascular Surgery

## 2024-03-07 HISTORY — DX: Acute myocardial infarction, unspecified: I21.9

## 2024-03-07 SURGERY — LAPAROSCOPY, DIAGNOSTIC
Anesthesia: Choice

## 2024-03-08 ENCOUNTER — Ambulatory Visit (HOSPITAL_COMMUNITY): Payer: Self-pay | Admitting: Cardiology

## 2024-03-08 ENCOUNTER — Ambulatory Visit (HOSPITAL_COMMUNITY)
Admission: RE | Admit: 2024-03-08 | Discharge: 2024-03-08 | Disposition: A | Discharge status: Home / Self Care | Source: Ambulatory Visit | Attending: Cardiology | Admitting: Cardiology

## 2024-03-08 DIAGNOSIS — I5032 Chronic diastolic (congestive) heart failure: Secondary | ICD-10-CM | POA: Insufficient documentation

## 2024-03-08 LAB — HEPATIC FUNCTION PANEL
ALT: 11 U/L (ref 0–44)
AST: 24 U/L (ref 15–41)
Albumin: 3.9 g/dL (ref 3.5–5.0)
Alkaline Phosphatase: 57 U/L (ref 38–126)
Bilirubin, Direct: <0.1 mg/dL (ref 0.0–0.2)
Total Bilirubin: 0.6 mg/dL (ref 0.0–1.2)
Total Protein: 7.8 g/dL (ref 6.5–8.1)

## 2024-03-08 LAB — LIPID PANEL
Cholesterol: 151 mg/dL (ref 0–200)
HDL: 61 mg/dL (ref >40)
LDL Cholesterol: 77 mg/dL (ref 0–99)
Total CHOL/HDL Ratio: 2.5 ratio
Triglycerides: 64 mg/dL (ref <150)
VLDL: 13 mg/dL (ref 0–40)

## 2024-03-09 MED ORDER — EZETIMIBE 10 MG PO TABS: 10.0000 mg | ORAL_TABLET | Freq: Every day | ORAL | 3 refills | Status: AC

## 2024-03-14 DIAGNOSIS — E1122 Type 2 diabetes mellitus with diabetic chronic kidney disease: Secondary | ICD-10-CM | POA: Diagnosis not present

## 2024-03-14 DIAGNOSIS — N186 End stage renal disease: Secondary | ICD-10-CM | POA: Diagnosis not present

## 2024-03-14 DIAGNOSIS — Z992 Dependence on renal dialysis: Secondary | ICD-10-CM | POA: Diagnosis not present

## 2024-03-15 ENCOUNTER — Ambulatory Visit (HOSPITAL_COMMUNITY)
Admission: RE | Admit: 2024-03-15 | Discharge: 2024-03-15 | Disposition: A | Discharge status: Home / Self Care | Attending: Vascular Surgery | Admitting: Vascular Surgery

## 2024-03-15 ENCOUNTER — Encounter (HOSPITAL_COMMUNITY)
Admission: RE | Disposition: A | Discharge status: Home / Self Care | Payer: Self-pay | Source: Home / Self Care | Attending: Vascular Surgery

## 2024-03-15 ENCOUNTER — Encounter (HOSPITAL_COMMUNITY): Payer: Self-pay | Admitting: Vascular Surgery

## 2024-03-15 ENCOUNTER — Other Ambulatory Visit: Payer: Self-pay

## 2024-03-15 DIAGNOSIS — E1122 Type 2 diabetes mellitus with diabetic chronic kidney disease: Secondary | ICD-10-CM | POA: Diagnosis not present

## 2024-03-15 DIAGNOSIS — Z992 Dependence on renal dialysis: Secondary | ICD-10-CM | POA: Diagnosis not present

## 2024-03-15 DIAGNOSIS — I132 Hypertensive heart and chronic kidney disease with heart failure and with stage 5 chronic kidney disease, or end stage renal disease: Secondary | ICD-10-CM | POA: Diagnosis not present

## 2024-03-15 DIAGNOSIS — I509 Heart failure, unspecified: Secondary | ICD-10-CM | POA: Diagnosis not present

## 2024-03-15 DIAGNOSIS — Y832 Surgical operation with anastomosis, bypass or graft as the cause of abnormal reaction of the patient, or of later complication, without mention of misadventure at the time of the procedure: Secondary | ICD-10-CM | POA: Diagnosis not present

## 2024-03-15 DIAGNOSIS — N186 End stage renal disease: Secondary | ICD-10-CM | POA: Insufficient documentation

## 2024-03-15 DIAGNOSIS — T82858A Stenosis of vascular prosthetic devices, implants and grafts, initial encounter: Secondary | ICD-10-CM | POA: Diagnosis not present

## 2024-03-15 HISTORY — PX: A/V FISTULAGRAM: CATH118298

## 2024-03-15 HISTORY — PX: VENOUS ANGIOPLASTY: CATH118376

## 2024-03-15 LAB — GLUCOSE, CAPILLARY: Glucose-Capillary: 109 mg/dL — ABNORMAL HIGH (ref 70–99)

## 2024-03-15 SURGERY — A/V FISTULAGRAM
Anesthesia: LOCAL | Site: Arm Upper | Laterality: Left

## 2024-03-15 MED ORDER — LIDOCAINE HCL (PF) 1 % IJ SOLN
INTRAMUSCULAR | Status: DC | PRN
  Administered 2024-03-15: 2 mL via SUBCUTANEOUS

## 2024-03-15 MED ORDER — HEPARIN (PORCINE) IN NACL 1000-0.9 UT/500ML-% IV SOLN
INTRAVENOUS | Status: DC | PRN
  Administered 2024-03-15: 500 mL

## 2024-03-15 MED ORDER — LIDOCAINE HCL (PF) 1 % IJ SOLN
INTRAMUSCULAR | Status: AC
  Filled 2024-03-15: qty 30

## 2024-03-15 MED ORDER — IODIXANOL 320 MG/ML IV SOLN
INTRAVENOUS | Status: DC | PRN
  Administered 2024-03-15: 55 mL via INTRAVENOUS

## 2024-03-15 SURGICAL SUPPLY — 11 items
BALLOON MUSTANG 10X80X75 (BALLOONS) IMPLANT
BALLOON MUSTANG 12.0X40 75 (BALLOONS) IMPLANT
BALLOON MUSTANG 8.0X40 75 (BALLOONS) IMPLANT
KIT ENCORE 26 ADVANTAGE (KITS) IMPLANT
KIT MICROPUNCTURE NIT STIFF (SHEATH) IMPLANT
SHEATH PINNACLE R/O II 7F 4CM (SHEATH) IMPLANT
SHEATH PROBE COVER 6X72 (BAG) IMPLANT
STOPCOCK MORSE 400PSI 3WAY (MISCELLANEOUS) IMPLANT
TRAY PV CATH (CUSTOM PROCEDURE TRAY) ×2 IMPLANT
TUBING CIL FLEX 10 FLL-RA (TUBING) IMPLANT
WIRE BENTSON .035X145CM (WIRE) IMPLANT

## 2024-03-15 NOTE — Op Note (Signed)
    Patient name: BLAKELY MARANAN MRN: [id number] DOB: 1961-05-16 Sex: female  03/15/2024 Pre-operative Diagnosis: ESRD on HD Post-operative diagnosis:  Same Surgeon:  Norman GORMAN Serve, MD Procedure Performed:  Ultrasound-guided access of dialysis circuit, fistulogram and central venogram, peripheral balloon angioplasty.  [id number]  Indications: Ms. Dutkiewicz is a 63 year old female with ESRD presenting to the HD access center for fistulogram.  She has had multiple fistulogram's in the last few months due to pain during HD sessions and has been having balloon angioplasty of a mid fistula stenosis.  Last was performed by Dr. Serene in August with a 10 mm Mustang balloon.  Her last HD session was yesterday.  We reviewed the risks and benefits of fistulogram and she elected to proceed.  Findings:  Widely patent central venous system.  There is a approximate 60% stenosis at the confluence of the cephalic vein in the axillary vein.  Approximate 60 to 70% stenosis in the mid fistula.  Widely patent anastomosis.    Procedure:  The patient was identified in the holding area and taken to the cath lab  The patient was then placed supine on the table and prepped and draped in the usual sterile fashion.  A time out was called.  Ultrasound was used to evaluate the left arm AV access. This was accessed under u/s guidance. An 018 wire was advanced without resistance, a micropuncture sheath was placed and fistulagram obtained which demonstrated the above findings.  This access was then upsized to a 82F short sheath over a glidewire.  The lesions were then crossed and to the mid fistula stenoses were first treated with a 10 mm x 80 mm Mustang balloon.  There was still some residual stenosis in the most proximal portion of the tandem stenoses in this area was treated with a 12 mm x 40 mm Mustang balloon with minimal residual stenosis.  I then obtained a second central venogram as the first 1 was somewhat limited by motion and  this did demonstrate a 60% stenosis at the cephalic axillary vein confluence.  This lesion was crossed with a wire and treated with a 8 mm x 40 mm Mustang balloon with minimal residual stenosis.  The wire and sheath were removed and the access was managed with a 4 Monocryl figure-of-eight suture for hemostasis.  Contrast: 55 cc Sedation: None  Impression: Successful balloon angioplasty of the mid fistula stenosis and the cephalic axillary confluence.  Improvement in thrill on completion.   Norman GORMAN Serve MD [facility] [place] Office: [phone]

## 2024-03-15 NOTE — H&P (Signed)
 HD ACCESS CENTER H&P   Patient ID: KALYSSA ANKER, female   DOB: 1960/10/29, 63 y.o.   MRN: [id number]  Subjective:     HPI SASHIA CAMPAS is a 63 y.o. female with ESRD presenting to the HD access center for intervention.  Past Medical History:  Diagnosis Date   Acute on chronic diastolic heart failure (HCC) 03/05/2021   Anemia    Arthritis    CAD (coronary artery disease)    CHF (congestive heart failure) (HCC)    Colon polyps    CVA (cerebral vascular accident) (HCC)    Depression    Diabetes mellitus without complication (HCC)    type 2   Difficult intubation    Dyspnea    ESRD on hemodialysis (HCC)    Hypertensive urgency 04/14/2021   Memory loss    mild   Myocardial infarction (HCC)    OSA (obstructive sleep apnea) 05/27/2021   uses CPAP 12-18 cm   Pneumonia    several times   Pure hypercholesterolemia 05/27/2021   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   Stroke [facility])    Family History  Problem Relation Age of Onset   Hypertension Mother    Stroke Mother    Diabetes Mother    Other Mother        enlarged heart   Hypertension Father    Hypertension Sister    Diabetes Sister    CAD Sister        enlarged heart   Kidney disease Sister    Diabetes Paternal Grandmother    Hypertension Daughter    Diabetes Daughter    Hypertension Son    Diabetes Son    Breast cancer Paternal Aunt    Past Surgical History:  Procedure Laterality Date   A/V FISTULAGRAM Left 08/09/2023   Procedure: A/V Fistulagram;  Surgeon: Sheree Penne Bruckner, MD;  Location: [facility] INVASIVE CV LAB;  Service: Cardiovascular;  Laterality: Left;   A/V SHUNT INTERVENTION N/A 12/15/2023   Procedure: A/V SHUNT INTERVENTION;  Surgeon: Pearline Norman GORMAN, MD;  Location: [facility] PV LAB;  Service: Cardiovascular;  Laterality: N/A;   A/V SHUNT INTERVENTION Left 01/21/2024   Procedure: A/V SHUNT INTERVENTION;  Surgeon: Serene Gaile ORN, MD;  Location: [facility] PV LAB;  Service: Cardiovascular;  Laterality: Left;    ABDOMINAL HYSTERECTOMY     AV FISTULA PLACEMENT Left 12/09/2021   Procedure: LEFT ARTERIOVENOUS (AV) FISTULA CREATION;  Surgeon: Sheree Penne Bruckner, MD;  Location: [facility] OR;  Service: Vascular;  Laterality: Left;   BIOPSY  09/08/2021   Procedure: BIOPSY;  Surgeon: Legrand Victory LITTIE DOUGLAS, MD;  Location: [facility] ENDOSCOPY;  Service: Gastroenterology;;   CESAREAN SECTION     x 1   ESOPHAGOGASTRODUODENOSCOPY (EGD) WITH PROPOFOL  N/A 09/08/2021   Procedure: ESOPHAGOGASTRODUODENOSCOPY (EGD) WITH PROPOFOL ;  Surgeon: Legrand Victory LITTIE DOUGLAS, MD;  Location: [facility] ENDOSCOPY;  Service: Gastroenterology;  Laterality: N/A;  chest pain, epigastric pain   FISTULA SUPERFICIALIZATION Left 08/11/2023   Procedure: FISTULA SUPERFICIALIZATION LEFT UPPER ARM;  Surgeon: Magda Debby SAILOR, MD;  Location: [facility] OR;  Service: Vascular;  Laterality: Left;   INNER EAR SURGERY Bilateral    INSERTION OF DIALYSIS CATHETER Right 08/11/2023   Procedure: INSERTION OF DIALYSIS CATHETER;  Surgeon: Magda Debby SAILOR, MD;  Location: [facility] OR;  Service: Vascular;  Laterality: Right;   RIGHT HEART CATH N/A 05/14/2021   Procedure: RIGHT HEART CATH;  Surgeon: Rolan Ezra GORMAN, MD;  Location: [facility] INVASIVE CV LAB;  Service: Cardiovascular;  Laterality: N/A;   TONSILLECTOMY  age 69   VENOUS ANGIOPLASTY  01/21/2024   Procedure: VENOUS ANGIOPLASTY;  Surgeon: Serene Gaile ORN, MD;  Location: [facility] PV LAB;  Service: Cardiovascular;;  Cephalic Vein    Short Social History:  Social History   Tobacco Use   Smoking status: Never    Passive exposure: Never   Smokeless tobacco: Never  Substance Use Topics   Alcohol use: Not Currently    Allergies  Allergen Reactions   Metformin And Related Nausea Only   Other     Clonidine  patch causes skin irritation    Hydrocodone Itching    Patient able to tolerate when taken with Benadryl.   Oxycodone -Acetaminophen  Itching    Patient able to tolerate when taken with Benadryl.    No current facility-administered  medications for this encounter.   Facility-Administered Medications Ordered in Other Encounters  Medication Dose Route Frequency Provider Last Rate Last Admin   technetium pyrophosphate Tc 2m injection 21.2 millicurie  21.2 millicurie Intravenous Once Hilty, Vinie BROCKS, MD        REVIEW OF SYSTEMS All other systems were reviewed and are negative     Objective:   Objective   Vitals:   03/15/24 0808 03/15/24 0818 03/15/24 0836  BP: (!) 141/72 125/64   Pulse: 80 78   Resp: 12 14   Temp: (!) 97.5 F (36.4 C)    TempSrc: Oral    SpO2: 94% 98% 98%   There is no height or weight on file to calculate BMI.  Physical Exam General: no acute distress Cardiac: hemodynamically stable Extremities: Palpable pulse in left arm AV fistula  Data: Reviewed fistulogram from Dr. Serene in August. The mid fistula stenosis was treated with a 10 mm x 80 mm Mustang balloon.     Assessment/Plan:   AUBRI GATHRIGHT is a 63 y.o. female with ESRD presenting for fistulogram.  Having issues with pain during HD sessions. Last HD session yesterday. Reviewed risks and benefits of fistulogram with intervention and patient agreed to proceed.   Norman Serve, MD [facility]

## 2024-03-26 ENCOUNTER — Telehealth: Payer: Self-pay | Admitting: *Deleted

## 2024-03-26 NOTE — Telephone Encounter (Signed)
 Team,  This pt has ESRD with HD; her procedure will need to be done at the hospital.  Thanks,  Norleen EMERSON Schillings

## 2024-03-27 NOTE — Telephone Encounter (Signed)
 LM on vmail to call back to schedule OV with APP per Dr. Legrand

## 2024-03-27 NOTE — Telephone Encounter (Signed)
 Informed patient her procedure will be done at the hospital due to ESRD for her safety. Patient verbalizes understanding. Informed patient I would be cancelling her pre-visit on 03/29/24 at 10:00 am as well as her colonoscopy at [facility].  Informed patient that Dr. Clayburn nurse would contact her with new date and time for colonoscopy to be done at the hospital. Patient verbalizes understanding.   May someone please schedule patient at the hospital and inform her of the new date and time as well as new pre-visit date. Thank you.

## 2024-03-27 NOTE — Telephone Encounter (Signed)
 Please see my scanned recall sheet from August of this year when I recommended that this patient be given an APP clinic visit due to her medical issues including, but not limited to, her end-stage renal disease.  H Danis

## 2024-03-27 NOTE — Telephone Encounter (Signed)
 Pt added to hospital list.

## 2024-03-29 ENCOUNTER — Encounter

## 2024-04-03 ENCOUNTER — Telehealth (HOSPITAL_COMMUNITY): Payer: Self-pay | Admitting: *Deleted

## 2024-04-03 ENCOUNTER — Encounter (HOSPITAL_COMMUNITY): Payer: Self-pay

## 2024-04-03 NOTE — Telephone Encounter (Signed)
Patient returning call about her upcoming cardiac imaging study; pt verbalizes understanding of appt date/time, parking situation and where to check in, pre-test NPO status and verified current allergies; name and call back number provided for further questions should they arise  Naji Mehringer RN Navigator Cardiac Imaging [facility] [phone] office [phone] cell  Patient aware to avoid caffeine 12 hours prior to her cardiac PET scan. 

## 2024-04-03 NOTE — Telephone Encounter (Signed)
Attempted to call patient regarding upcoming cardiac PET appointment. Left message on voicemail with name and callback number  Larey Brick RN Navigator Cardiac Imaging [facility] [phone] Office [phone] Cell  Reminder to avoid caffeine 12 hours prior to her cardiac PET study.

## 2024-04-04 ENCOUNTER — Encounter (HOSPITAL_COMMUNITY)
Admission: RE | Admit: 2024-04-04 | Discharge: 2024-04-04 | Disposition: A | Discharge status: Home / Self Care | Source: Ambulatory Visit | Attending: Cardiology | Admitting: Cardiology

## 2024-04-04 DIAGNOSIS — I5032 Chronic diastolic (congestive) heart failure: Secondary | ICD-10-CM | POA: Insufficient documentation

## 2024-04-04 DIAGNOSIS — K449 Diaphragmatic hernia without obstruction or gangrene: Secondary | ICD-10-CM | POA: Insufficient documentation

## 2024-04-04 DIAGNOSIS — I700 Atherosclerosis of aorta: Secondary | ICD-10-CM | POA: Insufficient documentation

## 2024-04-04 DIAGNOSIS — I2510 Atherosclerotic heart disease of native coronary artery without angina pectoris: Secondary | ICD-10-CM | POA: Insufficient documentation

## 2024-04-04 LAB — NM PET CT CARDIAC PERFUSION MULTI W/ABSOLUTE BLOODFLOW
LV dias vol: 153 mL (ref 46–106)
LV sys vol: 67 mL (ref 3.8–5.2)
MBFR: 1.48
Nuc Rest EF: 56 %
Nuc Stress EF: 64 %
Peak HR: 77 {beats}/min
Rest HR: 74 {beats}/min
Rest MBF: 1.11 ml/g/min
Rest Nuclear Isotope Dose: 25.8 mCi
ST Depression (mm): 0 mm
Stress MBF: 1.64 ml/g/min
Stress Nuclear Isotope Dose: 25.9 mCi

## 2024-04-04 MED ORDER — RUBIDIUM RB82 GENERATOR (RUBYFILL)
25.8000 | PACK | Freq: Once | INTRAVENOUS | Status: AC
  Administered 2024-04-04: 25.8 via INTRAVENOUS

## 2024-04-04 MED ORDER — REGADENOSON 0.4 MG/5ML IV SOLN
INTRAVENOUS | Status: AC
  Filled 2024-04-04: qty 5

## 2024-04-04 MED ORDER — RUBIDIUM RB82 GENERATOR (RUBYFILL)
25.9000 | PACK | Freq: Once | INTRAVENOUS | Status: AC
  Administered 2024-04-04: 25.9 via INTRAVENOUS

## 2024-04-04 MED ORDER — REGADENOSON 0.4 MG/5ML IV SOLN
0.4000 mg | Freq: Once | INTRAVENOUS | Status: AC
Start: 2024-04-04 — End: 2024-04-04
  Administered 2024-04-04: 0.4 mg via INTRAVENOUS

## 2024-04-05 NOTE — Telephone Encounter (Signed)
 Pt scheduled with sara heinz 04/19/24

## 2024-04-12 ENCOUNTER — Encounter: Admitting: Gastroenterology

## 2024-04-17 ENCOUNTER — Encounter: Payer: Self-pay | Admitting: Radiology

## 2024-04-19 ENCOUNTER — Ambulatory Visit: Admitting: Gastroenterology

## 2024-04-19 NOTE — Progress Notes (Deleted)
 Maria Hudson [id number] 08/27/1960   Chief Complaint:  Referring Provider: Shelda Atlas, MD Primary GI MD: Dr. Legrand  HPI: Maria Hudson is a 63 y.o. female with past medical history of CHF, CAD, anemia, colon polyps, CVA, diabetes, depression, difficult intubation, ESRD on hemodialysis, prior MI, OSA on CPAP, HLD, HTN, hysterectomy who presents today for a complaint of *** .    Last seen in office 04/07/2023 by Dr. Legrand for follow-up of chronic constipation.  Constipation is multifactorial, believed to be due to markedly redundant colon anatomy, possible side effect of iron tablets, and pelvic floor dysfunction.  Possibility of rectocele. Has been advised to use glycerin suppository when needed, use a squatty potty or similar, was referred to gynecology for full pelvic exam.  Advise have colonoscopy recall July 2025 due to poor prep.  Patient has end-stage renal disease and is on hemodialysis, will need hospital-based procedure.  Discussed the use of AI scribe software for clinical note transcription with the patient, who gave verbal consent to proceed.  History of Present Illness       Previous GI Procedures/Imaging   Colonoscopy 01/06/2023 - Three 4 to 5 mm polyps in the transverse colon, removed with a cold snare. Resected and retrieved.  - One 3 to 4 mm polyp in the distal rectum, removed with a cold snare. Resected and retrieved.  - Stool in the entire examined colon, extensive lavage performed as above  - mostly cleared with exception of portion of cecum and very small areas of ascending colon and splenic flexure.  - Internal hemorrhoids.  - The examination was otherwise normal. Path: 1. Surgical [P], colon, transverse, polyp (3) - HYPERPLASTIC POLYPS. 2. Surgical [P], colon, rectum, polyp (1) - HYPERPLASTIC POLYPS WITH EDEMA.   Past Medical History:  Diagnosis Date   Acute on chronic diastolic heart failure (HCC) 03/05/2021   Anemia    Arthritis     CAD (coronary artery disease)    CHF (congestive heart failure) (HCC)    Colon polyps    CVA (cerebral vascular accident) (HCC)    Depression    Diabetes mellitus without complication (HCC)    type 2   Difficult intubation    Dyspnea    ESRD on hemodialysis (HCC)    Hypertensive urgency 04/14/2021   Memory loss    mild   Myocardial infarction (HCC)    OSA (obstructive sleep apnea) 05/27/2021   uses CPAP 12-18 cm   Pneumonia    several times   Pure hypercholesterolemia 05/27/2021   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   Stroke [facility])     Past Surgical History:  Procedure Laterality Date   A/V FISTULAGRAM Left 08/09/2023   Procedure: A/V Fistulagram;  Surgeon: Sheree Penne Bruckner, MD;  Location: [facility] INVASIVE CV LAB;  Service: Cardiovascular;  Laterality: Left;   A/V FISTULAGRAM Left 03/15/2024   Procedure: A/V Fistulagram;  Surgeon: Pearline Norman GORMAN, MD;  Location: [facility] PV LAB;  Service: Cardiovascular;  Laterality: Left;   A/V SHUNT INTERVENTION N/A 12/15/2023   Procedure: A/V SHUNT INTERVENTION;  Surgeon: Pearline Norman GORMAN, MD;  Location: [facility];  Service: Cardiovascular;  Laterality: N/A;   A/V SHUNT INTERVENTION Left 01/21/2024   Procedure: A/V SHUNT INTERVENTION;  Surgeon: Serene Gaile ORN, MD;  Location: [facility];  Service: Cardiovascular;  Laterality: Left;   ABDOMINAL HYSTERECTOMY     AV FISTULA PLACEMENT Left 12/09/2021   Procedure: LEFT ARTERIOVENOUS (AV) FISTULA CREATION;  Surgeon: Sheree Penne Bruckner, MD;  Location:  [facility] OR;  Service: Vascular;  Laterality: Left;   BIOPSY  09/08/2021   Procedure: BIOPSY;  Surgeon: Legrand Victory LITTIE DOUGLAS, MD;  Location: [facility]OS[facility];  Service: Gastroenterology;;   CESAREAN SECTION     x 1   ESOPHAGOGASTRODUODENOSCOPY (EGD) WITH PROPOFOL  N/A 09/08/2021   Procedure: ESOPHAGOGASTRODUODENOSCOPY (EGD) WITH PROPOFOL ;  Surgeon: Legrand Victory LITTIE DOUGLAS, MD;  Location: [facility]OS[facility];  Service: Gastroenterology;  Laterality: N/A;  chest pain,  epigastric pain   FISTULA SUPERFICIALIZATION Left 08/11/2023   Procedure: FISTULA SUPERFICIALIZATION LEFT UPPER ARM;  Surgeon: Magda Debby SAILOR, MD;  Location: [facility] OR;  Service: Vascular;  Laterality: Left;   INNER EAR SURGERY Bilateral    INSERTION OF DIALYSIS CATHETER Right 08/11/2023   Procedure: INSERTION OF DIALYSIS CATHETER;  Surgeon: Magda Debby SAILOR, MD;  Location: [facility] OR;  Service: Vascular;  Laterality: Right;   RIGHT HEART CATH N/A 05/14/2021   Procedure: RIGHT HEART CATH;  Surgeon: Rolan Ezra RAMAN, MD;  Location: [facility] INVASIVE CV LAB;  Service: Cardiovascular;  Laterality: N/A;   TONSILLECTOMY     age 69   VENOUS ANGIOPLASTY  01/21/2024   Procedure: VENOUS ANGIOPLASTY;  Surgeon: Serene Gaile ORN, MD;  Location: [facility];  Service: Cardiovascular;;  Cephalic Vein   VENOUS ANGIOPLASTY Left 03/15/2024   Procedure: VENOUS ANGIOPLASTY;  Surgeon: Pearline Norman RAMAN, MD;  Location: [facility];  Service: Cardiovascular;  Laterality: Left;  Mid-AVF/Cephalic Vein; Cephalic Arch    Current Outpatient Medications  Medication Sig Dispense Refill   Accu-Chek Softclix Lancets lancets 3 (three) times daily.     acetaminophen  (TYLENOL ) 500 MG tablet Take 1,000 mg by mouth every 6 (six) hours as needed for moderate pain or headache.     allopurinol  (ZYLOPRIM ) 100 MG tablet Take 100 mg by mouth in the morning.     amLODipine  (NORVASC ) 10 MG tablet Take 10 mg by mouth in the morning.     aspirin  EC 81 MG tablet Take 81 mg by mouth in the morning. Swallow whole.     atorvastatin  (LIPITOR) 80 MG tablet Take 1 tablet (80 mg total) by mouth every evening. (Patient taking differently: Take 80 mg by mouth at bedtime.) 90 tablet 1   buPROPion  (WELLBUTRIN ) 100 MG tablet Take 100 mg by mouth 2 (two) times daily.     calcitRIOL (ROCALTROL) 0.5 MCG capsule Take 0.5 mcg by mouth in the morning.     calcium  acetate (PHOSLO) 667 MG capsule Take 1,334 mg by mouth 2 (two) times daily with a meal.     carvedilol  (COREG )  6.25 MG tablet Take 6.25 mg by mouth 2 (two) times daily with a meal.     Continuous Blood Gluc Transmit (DEXCOM G6 TRANSMITTER) MISC Use one every 3 month     ezetimibe  (ZETIA ) 10 MG tablet Take 1 tablet (10 mg total) by mouth daily. 90 tablet 3   ferrous sulfate  325 (65 FE) MG tablet Take 325 mg by mouth 2 (two) times daily.     fluticasone  (FLONASE ) 50 MCG/ACT nasal spray Place 1 spray into both nostrils daily as needed for allergies or rhinitis.     isosorbide  mononitrate (IMDUR ) 120 MG 24 hr tablet Take 120 mg by mouth at bedtime.     LINZESS  145 MCG CAPS capsule TAKE 1 CAPSULE BY MOUTH DAILY BEFORE BREAKFAST. 30 capsule 11   loratadine  (CLARITIN ) 10 MG tablet Take 10 mg by mouth in the morning.     Multiple Vitamin (MULTIVITAMIN ADULT) TABS Take 1  tablet by mouth daily with lunch.     nitroGLYCERIN  (NITROSTAT ) 0.4 MG SL tablet Place 1 tablet (0.4 mg total) under the tongue every 5 (five) minutes as needed for chest pain. 20 tablet 0   NOVOLOG  FLEXPEN 100 UNIT/ML FlexPen Inject 4-10 Units into the skin 2 (two) times daily with a meal.     Omega-3 Fatty Acids (FISH OIL PO) Take 2,400 mg by mouth daily with lunch.     OZEMPIC , 1 MG/DOSE, 4 MG/3ML SOPN Inject 1 mg into the skin every Tuesday.     pregabalin  (LYRICA ) 75 MG capsule Take 75 mg by mouth 2 (two) times daily.     sertraline  (ZOLOFT ) 50 MG tablet Take 50 mg by mouth in the morning.     torsemide  (DEMADEX ) 20 MG tablet Take 40 mg by mouth See admin instructions. Take 2 tablets (40 mg) by mouth on Wednesdays, Saturdays and Sundays in the morning (non dialysis days)     traMADol  (ULTRAM ) 50 MG tablet Take 1 tablet (50 mg total) by mouth every 12 (twelve) hours as needed. 6 tablet 0   traZODone  (DESYREL ) 50 MG tablet Take 50 mg by mouth at bedtime.     TURMERIC CURCUMIN PO Take 2,000 mg by mouth daily with lunch.     No current facility-administered medications for this visit.   Facility-Administered Medications Ordered in Other Visits   Medication Dose Route Frequency Provider Last Rate Last Admin   technetium pyrophosphate Tc 34m injection 21.2 millicurie  21.2 millicurie Intravenous Once Mona Vinie BROCKS, MD        Allergies as of 04/19/2024 - Review Complete 04/04/2024  Allergen Reaction Noted   Metformin and related Nausea Only 09/30/2021   Other  12/24/2021   Hydrocodone Itching 05/28/2015   Oxycodone -acetaminophen  Itching 02/25/2012    Family History  Problem Relation Age of Onset   Hypertension Mother    Stroke Mother    Diabetes Mother    Other Mother        enlarged heart   Hypertension Father    Hypertension Sister    Diabetes Sister    CAD Sister        enlarged heart   Kidney disease Sister    Diabetes Paternal Grandmother    Hypertension Daughter    Diabetes Daughter    Hypertension Son    Diabetes Son    Breast cancer Paternal Aunt     Social History   Tobacco Use   Smoking status: Never    Passive exposure: Never   Smokeless tobacco: Never  Vaping Use   Vaping status: Never Used  Substance Use Topics   Alcohol use: Not Currently   Drug use: Never     Review of Systems:    Constitutional: No weight loss, fever, chills, weakness or fatigue Eyes: No change in vision Ears, Nose, Throat:  No change in hearing or congestion Skin: No rash or itching Cardiovascular: No chest pain, chest pressure or palpitations   Respiratory: No SOB or cough Gastrointestinal: See HPI and otherwise negative Genitourinary: No dysuria or change in urinary frequency Neurological: No headache, dizziness or syncope Musculoskeletal: No new muscle or joint pain Hematologic: No bleeding or bruising    Physical Exam:  Vital signs: There were no vitals taken for this visit.  Constitutional: NAD, Well developed, Well nourished, alert and cooperative Head:  Normocephalic and atraumatic.  Eyes: No scleral icterus. Conjunctiva pink. Mouth: No oral lesions. Respiratory: Respirations even and unlabored.  Lungs clear to  auscultation bilaterally.  No wheezes, crackles, or rhonchi.  Cardiovascular:  Regular rate and rhythm. No murmurs. No peripheral edema. Gastrointestinal:  Soft, nondistended, nontender. No rebound or guarding. Normal bowel sounds. No appreciable masses or hepatomegaly. Rectal:  Not performed.  Neurologic:  Alert and oriented x4;  grossly normal neurologically.  Skin:   Dry and intact without significant lesions or rashes. Psychiatric: Oriented to person, place and time. Demonstrates good judgement and reason without abnormal affect or behaviors.   RELEVANT LABS AND IMAGING: CBC    Component Value Date/Time   WBC 6.3 12/18/2023 1031   RBC 4.57 12/18/2023 1031   HGB 12.4 12/18/2023 1031   HGB 10.9 (L) 02/22/2023 1520   HCT 38.0 12/18/2023 1031   HCT 35.7 02/22/2023 1520   PLT 216 12/18/2023 1031   PLT 227 02/22/2023 1520   MCV 83.2 12/18/2023 1031   MCV 89 02/22/2023 1520   MCH 27.1 12/18/2023 1031   MCHC 32.6 12/18/2023 1031   RDW 17.4 (H) 12/18/2023 1031   RDW 15.4 02/22/2023 1520   LYMPHSABS 0.2 (L) 10/11/2023 2215   LYMPHSABS 0.8 02/22/2023 1520   MONOABS 0.5 10/11/2023 2215   EOSABS 0.0 10/11/2023 2215   EOSABS 0.1 02/22/2023 1520   BASOSABS 0.0 10/11/2023 2215   BASOSABS 0.1 02/22/2023 1520    CMP     Component Value Date/Time   NA 139 12/18/2023 1031   NA 144 02/22/2023 1520   K 3.8 12/18/2023 1031   CL 93 (L) 12/18/2023 1031   CO2 26 12/18/2023 1031   GLUCOSE 79 12/18/2023 1031   BUN 106 (H) 12/18/2023 1031   BUN 54 (H) 02/22/2023 1520   CREATININE 7.96 (H) 12/18/2023 1031   CALCIUM  9.2 12/18/2023 1031   PROT 7.8 03/08/2024 0837   PROT 6.5 02/22/2023 1520   ALBUMIN 3.9 03/08/2024 0837   ALBUMIN 4.0 02/22/2023 1520   AST 24 03/08/2024 0837   ALT 11 03/08/2024 0837   ALKPHOS 57 03/08/2024 0837   BILITOT 0.6 03/08/2024 0837   BILITOT <0.2 02/22/2023 1520   GFRNONAA 5 (L) 12/18/2023 1031     Assessment/Plan:    Assessment and  Plan Assessment & Plan        Camie Furbish, PA-C [facility] 04/19/2024, 8:20 AM  Patient Care Team: Shelda Atlas, MD as PCP - General (Internal Medicine) Lonni Slain, MD as PCP - Cardiology (Cardiology) Burnard Debby LABOR, MD (Inactive) as PCP - Sleep Medicine (Cardiology) [facility]

## 2024-04-26 ENCOUNTER — Other Ambulatory Visit: Payer: Self-pay

## 2024-04-26 DIAGNOSIS — N186 End stage renal disease: Secondary | ICD-10-CM

## 2024-05-03 ENCOUNTER — Other Ambulatory Visit (HOSPITAL_COMMUNITY)

## 2024-05-10 ENCOUNTER — Ambulatory Visit (HOSPITAL_COMMUNITY)

## 2024-05-17 ENCOUNTER — Ambulatory Visit (HOSPITAL_COMMUNITY)
Admission: RE | Admit: 2024-05-17 | Discharge: 2024-05-17 | Disposition: A | Source: Ambulatory Visit | Attending: Cardiology

## 2024-05-17 ENCOUNTER — Ambulatory Visit (HOSPITAL_COMMUNITY): Payer: Self-pay | Admitting: Cardiology

## 2024-05-17 DIAGNOSIS — I5032 Chronic diastolic (congestive) heart failure: Secondary | ICD-10-CM

## 2024-05-17 LAB — HEPATIC FUNCTION PANEL
ALT: 11 U/L (ref 0–44)
AST: 23 U/L (ref 15–41)
Albumin: 3.5 g/dL (ref 3.5–5.0)
Alkaline Phosphatase: 44 U/L (ref 38–126)
Bilirubin, Direct: <0.1 mg/dL (ref 0.0–0.2)
Total Bilirubin: 0.4 mg/dL (ref 0.0–1.2)
Total Protein: 7.1 g/dL (ref 6.5–8.1)

## 2024-05-17 LAB — LIPID PANEL
Cholesterol: 89 mg/dL (ref 0–200)
HDL: 44 mg/dL (ref >40)
LDL Cholesterol: 33 mg/dL (ref 0–99)
Total CHOL/HDL Ratio: 2 ratio
Triglycerides: 59 mg/dL (ref <150)
VLDL: 12 mg/dL (ref 0–40)

## 2024-06-01 ENCOUNTER — Other Ambulatory Visit: Payer: Self-pay

## 2024-06-01 ENCOUNTER — Encounter (HOSPITAL_COMMUNITY): Payer: Self-pay | Admitting: Vascular Surgery

## 2024-06-01 NOTE — Telephone Encounter (Signed)
 Incoming kidney txp referral received - letter sent

## 2024-06-01 NOTE — Anesthesia Preprocedure Evaluation (Signed)
 Anesthesia Evaluation    Airway        Dental   Pulmonary           Cardiovascular hypertension,   NM PET CT Cardiac Perfusion 04/04/2024:   The perfusion study is normal. The study is low risk based on the normal perfusion pattern and normal augmentation of systolic function. However, the myocardial blood flow reserve is abnormal. Triple vessel calcification is seen on CT images and balanced ischemia cannot be excluded. Aternatively, the coronary flow abnormality may be explained by microvascular disease.   LV perfusion is normal.   Rest left ventricular function is normal. Rest EF: 56%. Stress EF: 64%. End diastolic cavity size is normal. End systolic cavity size is normal.   Myocardial blood flow was computed to be 1.46ml/g/min at rest and 1.64ml/g/min at stress. Global myocardial blood flow reserve was 1.48 and was abnormal.   Coronary calcium  was present on the attenuation correction CT images. Moderate coronary calcifications were present. Coronary calcifications were present in the left anterior descending artery, left circumflex artery and right coronary artery distribution(s).    Neuro/Psych    GI/Hepatic   Endo/Other  diabetes    Renal/GU      Musculoskeletal   Abdominal   Peds  Hematology   Anesthesia Other Findings   Reproductive/Obstetrics                              Anesthesia Physical Anesthesia Plan  ASA:   Anesthesia Plan:    Post-op Pain Management:    Induction:   PONV Risk Score and Plan:   Airway Management Planned:   Additional Equipment:   Intra-op Plan:   Post-operative Plan:   Informed Consent:   Plan Discussed with:   Anesthesia Plan Comments: (PAT note written 06/01/2024 by Sidonia Nutter, PA-C.  )        Anesthesia Quick Evaluation

## 2024-06-01 NOTE — Progress Notes (Signed)
 SDW CALL  Patient was given pre-op  instructions over the phone. The opportunity was given for the patient to ask questions. No further questions asked. Patient verbalized understanding of instructions given.   PCP - Dr. Aliene Colon Cardiologist - Dr.  Ezra Shuck  PPM/ICD - denies Device Orders - n/a Rep Notified - n/a  Chest x-ray - 12/18/23 EKG - 01/05/24 Stress Test - 04/04/24 ECHO - 10/28/22 Cardiac Cath - 04/2021  Sleep Study - OSA+ and wears CPAP sometimes   Fasting Blood Sugar - unknown Checks Blood Sugar _2-3____ times a day  Last dose of GLP1 agonist-  last dose of Ozempic  was 2 weeks ago and patient is aware not to take prior to procedure   Blood Thinner Instructions: n/a Aspirin  Instructions: continue per Dr. Sheree  ERAS Protcol - NPO PRE-SURGERY Ensure or G2- n/a  COVID TEST- n/a   Anesthesia review: yes - cardiac history  Last dose of nitroglycerin  was several months ago.  Patient denies recent chest pain or shortness of breath.    Patient denies shortness of breath, fever, cough and chest pain over the phone call   All instructions explained to the patient, with a verbal understanding of the material. Patient agrees to go over the instructions while at home for a better understanding.

## 2024-06-01 NOTE — Progress Notes (Signed)
 Anesthesia Chart Review: CANDELARIA CLAM WORK-UP  Case: [id number] Date/Time: 06/02/24 1235   Procedure: INSERTION, CATHETER, DIALYSIS, PERITONEAL   Anesthesia type: Choice   Diagnosis: ESRD (end stage renal disease) (HCC) [N18.6]   Pre-op  diagnosis: ESRD   [facility] / [facility]   Surgeons: Sheree Penne Bruckner, MD       DISCUSSION:  Patient is a 63 year old female scheduled for the above procedure. S/p revision of LUE AVF and insertion of right internal jugular TDC on 08/11/2023. Hospitalized 10/11/2023 - 10/15/2023 for fever. TDC removed with catheter tip +Enterobacter. She has required multiple venoplasties of her AVF.  History includes never smoker, HTN (resistant, normal metanephrine/catecholamines and aldosterone 02/2021), DM2, CAD (CTO rPDA with left-to-right collaterals, 20% LAD, 50-60% LCX12/2014; stable anatomy 12/2018), chronic diastolic CHF, ESRD (HD initiated ~ 08/2023; left brachiocephalic AVF 12/09/2021 with revision 08/11/2023), OSA (uses CPAP), hypercholesterolemia, CVA (> 10 years ago), memory loss (mild), anemia, dyspnea.   She has a remote history of DIFFICULT AIRWAY/INTUBATION ~ 2001. By notes, Patient reports being told that she had a difficult airway to intubatewith ~2001 surgery; 'My mouth hurt worse than my incision'.  Intubation record from [facility] on 11/05/15 indicates oral 7.5 mm ETT placed, video laryngoscopy, Storz D-blade #3, easy ventilation by mask.    Last cardiology visit was on 01/05/2024 with Dr. [facility]an [facility]. She had been evaluated in the ED for left arm pain and dyspnea earlier in the month. Volume status controlled with HD. She also wanted to transition to PD.  A PET CT Cardiac perfusion study was ordered and done on 04/04/2024 and showed normal perfusion, low risk, EF at rest 56%. Zio monitor in July 2025 showed predominantly NSR, 6 beats NSVT, 7 beats SVT, rare PACs/PVCs. Six month cardiology follow-up planned.   A1c 5.7% on  14/29/2025. She is on Novolog  5 units TID with meals, Ozempic  2 mg weekly. Last Ozempic  was ~ 2 weeks ago.     Anesthesia team to evaluate on the day of surgery. She has a clinical FYI: Blood Products Refusal. H/H 10.4/33.6% on 05/15/2024. For updated labs on arrival.     VS:  Wt Readings from Last 3 Encounters:  01/05/24 105.1 kg  12/22/23 103.4 kg  10/14/23 95.9 kg   BP Readings from Last 3 Encounters:  04/04/24 (!) 120/53  03/15/24 136/66  01/21/24 (!) 155/72   Pulse Readings from Last 3 Encounters:  03/15/24 71  01/21/24 68  01/05/24 76     PROVIDERS: Shelda Atlas, MD is PCP  Raford Riggs, MD is cardiologist ([facility]) Rolan Barrack, MD is HF cardiologist Burnard Ned, MD is cardiologist ([facility]). Recently retired.  Jerrye Mar, MD is nephrologist   LABS: For day of surgery. As of 05/15/2024 ([facility]): H/H 10.4/33.6%.     SLEEP STUDY: 06/01/21: IMPRESSIONS - Moderate obstructive sleep apnea occurred during this study (AHI 17.3/h; RDI 19.8/h).Events were severe with supine sleep (AHI 40.0/h) and during REM sleep (AHI 57.0/h). - Significant oxygen  desaturation to a nadir of 77.0%. - The patient snored with moderate snoring volume. - No cardiac abnormalities were noted during this study. - Clinically significant periodic limb movements did not occur during sleep. No significant associated arousals.     IMAGES: CT Chest (over read NM PET CT Cardiac Perfusion) 04/04/2024: IMPRESSION: 1. No acute findings in the imaged extracardiac chest. 2. Tiny hiatal hernia. 3.  Aortic Atherosclerosis (ICD10-I70.0).    EKG:  EKG 01/05/2024:  Normal sinus rhythm  Left axis deviation Septal infarct , age undetermined Abnormal ECG When compared with ECG of 18-Dec-2023 10:35, No significant change was found Confirmed by Perla Lye [phone]) on 01/05/2024 8:21:29 PM    CV: NM PET CT Cardiac Perfusion 04/04/2024:   The perfusion study is normal. The study is low risk  based on the normal perfusion pattern and normal augmentation of systolic function. However, the myocardial blood flow reserve is abnormal. Triple vessel calcification is seen on CT images and balanced ischemia cannot be excluded. Aternatively, the coronary flow abnormality may be explained by microvascular disease.   LV perfusion is normal.   Rest left ventricular function is normal. Rest EF: 56%. Stress EF: 64%. End diastolic cavity size is normal. End systolic cavity size is normal.   Myocardial blood flow was computed to be 1.70ml/g/min at rest and 1.64ml/g/min at stress. Global myocardial blood flow reserve was 1.48 and was abnormal.   Coronary calcium  was present on the attenuation correction CT images. Moderate coronary calcifications were present. Coronary calcifications were present in the left anterior descending artery, left circumflex artery and right coronary artery distribution(s).   Long Term Zio Monitor 01/05/2024 - 01/19/2024: 1. Predominantly NSR.  2. 1 short NSVT run, 6 beats. 3. 1 short SVT run, 7 beats. 4. Rare PVCs, PACs.    Myocardial Amyloid Imaging Planar & SPECT 02/22/2023:   Myocardial uptake was positive for radiotracer uptake. The visual grade of myocardial uptake relative to the ribs was Grade 1 (Myocardial uptake less than rib uptake).   Findings are equivocal (Grade 1) of cardiac ATTR amyloidosis.   Prior study available for comparison from 05/27/2021.  No change compared to prior study reported at [facility] in 2020.     Echo 10/28/22: IMPRESSIONS   1. Left ventricular ejection fraction, by estimation, is 60 to 65%. The  left ventricle has normal function. The left ventricle has no regional  wall motion abnormalities. There is moderate left ventricular hypertrophy.  Left ventricular diastolic  parameters are consistent with Grade I diastolic dysfunction (impaired  relaxation).   2. Right ventricular systolic function is normal. The right ventricular  size is  normal. There is normal pulmonary artery systolic pressure.   3. Left atrial size was mildly dilated.   4. The mitral valve is normal in structure. No evidence of mitral valve  regurgitation.   5. The aortic valve is normal in structure. Aortic valve regurgitation is  not visualized.   6. The inferior vena cava is normal in size with greater than 50%  respiratory variability, suggesting right atrial pressure of 3 mmHg.      RHC 05/14/21: 1. Normal filling pressures but with prominent v-waves in the PCWP tracing.  No significant MR on recent echo, suspect due to LV diastolic dysfunction.  2. Mild pulmonary venous hypertension.  Would screen for sleep apnea if not already done.  3. Preserved CO.      US  Carotid 08/07/20 ([facility]): IMPRESSIONS:  1.  Atherosclerosis in bilateral cervical carotid arteries as described  above.  2.  R ICA: evidence of no stenosis by criteria.  3.  L ICA: evidence of no stenosis by criteria.  4.  Bilateral vertebral arteries demonstrate antegrade flow.      RHC/LHC 12/22/18 ([facility]): NARRATIVE: 1. Single vessel obstructive CAD with CTO of rPDA which fills via left to right collaterals, no change in anatomy since last LHC  2. Severely elevated LVEDP  3. Moderate pulmonary HTN (secondary to left heart disease)  4. Moderate sedation with IV benadryl, fentanyl , and Versed  for > 20 minutes personally supervised by me, no sedation issues   Recommendations:  1. Diuresis  2. Continue medical therapy and risk factor modification for stable CAD  - By notes, previous LHC 05/2016 showed: 20% mid LAD, CX with 50-60% in a large branch of a large Ramus INT, otherwise no significant LCX disease, 30% mid RCA, 100% rPDA with left-to-right collaterals.    Past Medical History:  Diagnosis Date   Acute on chronic diastolic heart failure (HCC) 03/05/2021   Anemia    Arthritis    CAD (coronary artery disease)    CHF (congestive heart failure) (HCC)    Colon polyps     CVA (cerebral vascular accident) (HCC)    Depression    Diabetes mellitus without complication (HCC)    type 2   Difficult intubation    Dyspnea    ESRD on hemodialysis (HCC)    Heart murmur    Hypertensive urgency 04/14/2021   Memory loss    mild   Myocardial infarction (HCC)    OSA (obstructive sleep apnea) 05/27/2021   uses CPAP 12-18 cm   Pneumonia    several times   Pure hypercholesterolemia 05/27/2021   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   Stroke [facility])     Past Surgical History:  Procedure Laterality Date   A/V FISTULAGRAM Left 08/09/2023   Procedure: A/V Fistulagram;  Surgeon: Sheree Penne Bruckner, MD;  Location: [facility] INVASIVE CV LAB;  Service: Cardiovascular;  Laterality: Left;   A/V FISTULAGRAM Left 03/15/2024   Procedure: A/V Fistulagram;  Surgeon: Pearline Norman RAMAN, MD;  Location: [facility] PV LAB;  Service: Cardiovascular;  Laterality: Left;   A/V SHUNT INTERVENTION N/A 12/15/2023   Procedure: A/V SHUNT INTERVENTION;  Surgeon: Pearline Norman RAMAN, MD;  Location: [facility] PV LAB;  Service: Cardiovascular;  Laterality: N/A;   A/V SHUNT INTERVENTION Left 01/21/2024   Procedure: A/V SHUNT INTERVENTION;  Surgeon: Serene Gaile ORN, MD;  Location: [facility] LAB;  Service: Cardiovascular;  Laterality: Left;   ABDOMINAL HYSTERECTOMY     AV FISTULA PLACEMENT Left 12/09/2021   Procedure: LEFT ARTERIOVENOUS (AV) FISTULA CREATION;  Surgeon: Sheree Penne Bruckner, MD;  Location: [facility] OR;  Service: Vascular;  Laterality: Left;   BIOPSY  09/08/2021   Procedure: BIOPSY;  Surgeon: Legrand Victory LITTIE DOUGLAS, MD;  Location: [facility];  Service: Gastroenterology;;   CESAREAN SECTION     x 1   ESOPHAGOGASTRODUODENOSCOPY (EGD) WITH PROPOFOL  N/A 09/08/2021   Procedure: ESOPHAGOGASTRODUODENOSCOPY (EGD) WITH PROPOFOL ;  Surgeon: Legrand Victory LITTIE DOUGLAS, MD;  Location: [facility];  Service: Gastroenterology;  Laterality: N/A;  chest pain, epigastric pain   FISTULA SUPERFICIALIZATION Left 08/11/2023   Procedure: FISTULA  SUPERFICIALIZATION LEFT UPPER ARM;  Surgeon: Magda Debby SAILOR, MD;  Location: [facility] OR;  Service: Vascular;  Laterality: Left;   INNER EAR SURGERY Bilateral    INSERTION OF DIALYSIS CATHETER Right 08/11/2023   Procedure: INSERTION OF DIALYSIS CATHETER;  Surgeon: Magda Debby SAILOR, MD;  Location[facility] OR;  Service: Vascular;  Laterality: Right;   RIGHT HEART CATH N/A 05/14/2021   Procedure: RIGHT HEART CATH;  Surgeon: Rolan Ezra RAMAN, MD;  Location: [facility] INVASIVE CV LAB;  Service: Cardiovascular;  Laterality: N/A;   TONSILLECTOMY     age 44   VENOUS ANGIOPLASTY  01/21/2024   Procedure: VENOUS ANGIOPLASTY;  Surgeon: Serene Gaile ORN, MD;  Location: [facility];  Service: Cardiovascular;;  Cephalic Vein   VENOUS ANGIOPLASTY Left 03/15/2024  Procedure: VENOUS ANGIOPLASTY;  Surgeon: Pearline Norman RAMAN, MD;  Location: [facility];  Service: Cardiovascular;  Laterality: Left;  Mid-AVF/Cephalic Vein; Cephalic Arch    MEDICATIONS:  acetaminophen  (TYLENOL ) 500 MG tablet   allopurinol  (ZYLOPRIM ) 100 MG tablet   aspirin  EC 81 MG tablet   atorvastatin  (LIPITOR) 80 MG tablet   buPROPion  (WELLBUTRIN ) 100 MG tablet   carvedilol  (COREG ) 3.125 MG tablet   Cholecalciferol (D3) 25 MCG (1000 UT) capsule   diphenhydrAMINE (BENADRYL) 25 MG tablet   ezetimibe  (ZETIA ) 10 MG tablet   fluticasone  (FLONASE ) 50 MCG/ACT nasal spray   isosorbide  mononitrate (IMDUR ) 120 MG 24 hr tablet   LINZESS  145 MCG CAPS capsule   loratadine  (CLARITIN ) 10 MG tablet   Multiple Vitamin (MULTIVITAMIN ADULT) TABS   nitroGLYCERIN  (NITROSTAT ) 0.4 MG SL tablet   NOVOLOG  FLEXPEN 100 UNIT/ML FlexPen   Omega-3 Fatty Acids (FISH OIL PO)   oxyCODONE  (OXY IR/ROXICODONE ) 5 MG immediate release tablet   OZEMPIC , 2 MG/DOSE, 8 MG/3ML SOPN   pregabalin  (LYRICA ) 75 MG capsule   sertraline  (ZOLOFT ) 50 MG tablet   sucroferric oxyhydroxide (VELPHORO ) 500 MG chewable tablet   torsemide  (DEMADEX ) 20 MG tablet   traZODone  (DESYREL ) 50 MG tablet   TURMERIC  CURCUMIN PO   calcitRIOL (ROCALTROL) 0.5 MCG capsule    technetium pyrophosphate Tc 70m injection 21.2 millicurie    Erin Uecker, PA-C Surgical Short Stay/Anesthesiology [facility] Phone [phone] [facility] Phone [phone] 06/01/2024 9:37 AM

## 2024-06-02 ENCOUNTER — Encounter (HOSPITAL_COMMUNITY): Admission: RE | Disposition: A | Payer: Self-pay | Source: Home / Self Care | Attending: Vascular Surgery

## 2024-06-02 ENCOUNTER — Ambulatory Visit (HOSPITAL_COMMUNITY): Payer: Self-pay | Admitting: Vascular Surgery

## 2024-06-02 ENCOUNTER — Encounter (HOSPITAL_COMMUNITY): Payer: Self-pay | Admitting: Vascular Surgery

## 2024-06-02 ENCOUNTER — Ambulatory Visit (HOSPITAL_COMMUNITY)
Admission: RE | Admit: 2024-06-02 | Discharge: 2024-06-02 | Disposition: A | Discharge status: Home / Self Care | Attending: Vascular Surgery | Admitting: Vascular Surgery

## 2024-06-02 DIAGNOSIS — K660 Peritoneal adhesions (postprocedural) (postinfection): Secondary | ICD-10-CM | POA: Diagnosis not present

## 2024-06-02 DIAGNOSIS — E1122 Type 2 diabetes mellitus with diabetic chronic kidney disease: Secondary | ICD-10-CM | POA: Insufficient documentation

## 2024-06-02 DIAGNOSIS — Z992 Dependence on renal dialysis: Secondary | ICD-10-CM | POA: Insufficient documentation

## 2024-06-02 DIAGNOSIS — I2510 Atherosclerotic heart disease of native coronary artery without angina pectoris: Secondary | ICD-10-CM | POA: Diagnosis not present

## 2024-06-02 DIAGNOSIS — M1990 Unspecified osteoarthritis, unspecified site: Secondary | ICD-10-CM | POA: Insufficient documentation

## 2024-06-02 DIAGNOSIS — I132 Hypertensive heart and chronic kidney disease with heart failure and with stage 5 chronic kidney disease, or end stage renal disease: Secondary | ICD-10-CM

## 2024-06-02 DIAGNOSIS — I5033 Acute on chronic diastolic (congestive) heart failure: Secondary | ICD-10-CM

## 2024-06-02 DIAGNOSIS — N186 End stage renal disease: Secondary | ICD-10-CM | POA: Insufficient documentation

## 2024-06-02 DIAGNOSIS — G4730 Sleep apnea, unspecified: Secondary | ICD-10-CM | POA: Diagnosis not present

## 2024-06-02 DIAGNOSIS — I509 Heart failure, unspecified: Secondary | ICD-10-CM | POA: Diagnosis not present

## 2024-06-02 DIAGNOSIS — I252 Old myocardial infarction: Secondary | ICD-10-CM | POA: Insufficient documentation

## 2024-06-02 DIAGNOSIS — I6930 Unspecified sequelae of cerebral infarction: Secondary | ICD-10-CM | POA: Diagnosis not present

## 2024-06-02 HISTORY — PX: LAPAROSCOPIC LYSIS OF ADHESIONS: SHX5905

## 2024-06-02 HISTORY — PX: TENCKHOFF CATHETER INSERTION: SHX5251

## 2024-06-02 HISTORY — DX: Cardiac murmur, unspecified: R01.1

## 2024-06-02 LAB — POCT I-STAT, CHEM 8
BUN: 66 mg/dL — ABNORMAL HIGH (ref 8–23)
Calcium, Ion: 1.08 mmol/L — ABNORMAL LOW (ref 1.15–1.40)
Chloride: 93 mmol/L — ABNORMAL LOW (ref 98–111)
Creatinine, Ser: 6.8 mg/dL — ABNORMAL HIGH (ref 0.44–1.00)
Glucose, Bld: 89 mg/dL (ref 70–99)
HCT: 34 % — ABNORMAL LOW (ref 36.0–46.0)
Hemoglobin: 11.6 g/dL — ABNORMAL LOW (ref 12.0–15.0)
Potassium: 3.8 mmol/L (ref 3.5–5.1)
Sodium: 134 mmol/L — ABNORMAL LOW (ref 135–145)
TCO2: 27 mmol/L (ref 22–32)

## 2024-06-02 LAB — GLUCOSE, CAPILLARY
Glucose-Capillary: 102 mg/dL — ABNORMAL HIGH (ref 70–99)
Glucose-Capillary: 90 mg/dL (ref 70–99)
Glucose-Capillary: 105 mg/dL — ABNORMAL HIGH (ref 70–99)

## 2024-06-02 LAB — NO BLOOD PRODUCTS

## 2024-06-02 SURGERY — INSERTION, CATHETER, DIALYSIS, PERITONEAL
Anesthesia: General | Site: Abdomen

## 2024-06-02 MED ORDER — ONDANSETRON HCL 4 MG/2ML IJ SOLN
INTRAMUSCULAR | Status: AC
  Filled 2024-06-02: qty 2

## 2024-06-02 MED ORDER — SODIUM CHLORIDE 0.9 % IR SOLN
Status: DC | PRN
  Administered 2024-06-02: 1000 mL

## 2024-06-02 MED ORDER — LIDOCAINE 2% (20 MG/ML) 5 ML SYRINGE
INTRAMUSCULAR | Status: DC | PRN
  Administered 2024-06-02: 100 mg via INTRAVENOUS

## 2024-06-02 MED ORDER — PROPOFOL 10 MG/ML IV BOLUS
INTRAVENOUS | Status: AC
  Filled 2024-06-02: qty 20

## 2024-06-02 MED ORDER — PROPOFOL 10 MG/ML IV BOLUS
INTRAVENOUS | Status: DC | PRN
  Administered 2024-06-02: 120 mg via INTRAVENOUS

## 2024-06-02 MED ORDER — CEFAZOLIN SODIUM-DEXTROSE 2-4 GM/100ML-% IV SOLN
INTRAVENOUS | Status: AC
  Filled 2024-06-02: qty 100

## 2024-06-02 MED ORDER — INSULIN ASPART 100 UNIT/ML IJ SOLN: 0.0000 [IU] | INTRAMUSCULAR | Status: DC | PRN

## 2024-06-02 MED ORDER — FENTANYL CITRATE (PF) 250 MCG/5ML IJ SOLN
INTRAMUSCULAR | Status: AC
  Filled 2024-06-02: qty 5

## 2024-06-02 MED ORDER — ONDANSETRON HCL 4 MG/2ML IJ SOLN: 4.0000 mg | Freq: Once | INTRAMUSCULAR | Status: DC | PRN

## 2024-06-02 MED ORDER — ROCURONIUM BROMIDE 10 MG/ML (PF) SYRINGE
PREFILLED_SYRINGE | INTRAVENOUS | Status: DC | PRN
  Administered 2024-06-02: 20 mg via INTRAVENOUS
  Administered 2024-06-02: 50 mg via INTRAVENOUS

## 2024-06-02 MED ORDER — DEXAMETHASONE SOD PHOSPHATE PF 10 MG/ML IJ SOLN
INTRAMUSCULAR | Status: DC | PRN
  Administered 2024-06-02: 5 mg via INTRAVENOUS

## 2024-06-02 MED ORDER — ONDANSETRON HCL 4 MG/2ML IJ SOLN
INTRAMUSCULAR | Status: DC | PRN
  Administered 2024-06-02: 4 mg via INTRAVENOUS

## 2024-06-02 MED ORDER — FENTANYL CITRATE (PF) 250 MCG/5ML IJ SOLN
INTRAMUSCULAR | Status: DC | PRN
  Administered 2024-06-02: 50 ug via INTRAVENOUS
  Administered 2024-06-02 (×2): 25 ug via INTRAVENOUS
  Administered 2024-06-02: 100 ug via INTRAVENOUS

## 2024-06-02 MED ORDER — MIDAZOLAM HCL (PF) 2 MG/2ML IJ SOLN
INTRAMUSCULAR | Status: DC | PRN
  Administered 2024-06-02: 2 mg via INTRAVENOUS

## 2024-06-02 MED ORDER — ORAL CARE MOUTH RINSE: 15.0000 mL | Freq: Once | OROMUCOSAL | Status: AC

## 2024-06-02 MED ORDER — LABETALOL HCL 5 MG/ML IV SOLN
10.0000 mg | INTRAVENOUS | Status: DC | PRN
  Administered 2024-06-02: 10 mg via INTRAVENOUS

## 2024-06-02 MED ORDER — SODIUM CHLORIDE 0.9 % IV SOLN: INTRAVENOUS | Status: DC

## 2024-06-02 MED ORDER — LIDOCAINE-EPINEPHRINE (PF) 1 %-1:200000 IJ SOLN
INTRAMUSCULAR | Status: DC | PRN
  Administered 2024-06-02: 10 mL via INTRADERMAL

## 2024-06-02 MED ORDER — CEFAZOLIN SODIUM-DEXTROSE 2-4 GM/100ML-% IV SOLN
2.0000 g | INTRAVENOUS | Status: AC
  Administered 2024-06-02: 2 g via INTRAVENOUS

## 2024-06-02 MED ORDER — 0.9 % SODIUM CHLORIDE (POUR BTL) OPTIME
TOPICAL | Status: DC | PRN
  Administered 2024-06-02: 1000 mL

## 2024-06-02 MED ORDER — FENTANYL CITRATE (PF) 100 MCG/2ML IJ SOLN: 25.0000 ug | INTRAMUSCULAR | Status: DC | PRN

## 2024-06-02 MED ORDER — SUGAMMADEX SODIUM 200 MG/2ML IV SOLN
INTRAVENOUS | Status: AC
  Filled 2024-06-02: qty 2

## 2024-06-02 MED ORDER — TRAMADOL HCL 50 MG PO TABS: 50.0000 mg | ORAL_TABLET | Freq: Four times a day (QID) | ORAL | 0 refills | Status: AC | PRN

## 2024-06-02 MED ORDER — PHENYLEPHRINE 80 MCG/ML (10ML) SYRINGE FOR IV PUSH (FOR BLOOD PRESSURE SUPPORT)
PREFILLED_SYRINGE | INTRAVENOUS | Status: AC
  Filled 2024-06-02: qty 10

## 2024-06-02 MED ORDER — CHLORHEXIDINE GLUCONATE 4 % EX SOLN: 60.0000 mL | Freq: Once | CUTANEOUS | Status: DC

## 2024-06-02 MED ORDER — CHLORHEXIDINE GLUCONATE 0.12 % MT SOLN
OROMUCOSAL | Status: AC
  Administered 2024-06-02: 15 mL via OROMUCOSAL
  Filled 2024-06-02: qty 15

## 2024-06-02 MED ORDER — LIDOCAINE-EPINEPHRINE (PF) 1 %-1:200000 IJ SOLN
INTRAMUSCULAR | Status: AC
  Filled 2024-06-02: qty 30

## 2024-06-02 MED ORDER — CHLORHEXIDINE GLUCONATE 0.12 % MT SOLN: 15.0000 mL | Freq: Once | OROMUCOSAL | Status: AC

## 2024-06-02 MED ORDER — SUGAMMADEX SODIUM 200 MG/2ML IV SOLN
INTRAVENOUS | Status: DC | PRN
  Administered 2024-06-02: 200 mg via INTRAVENOUS

## 2024-06-02 MED ORDER — LABETALOL HCL 5 MG/ML IV SOLN
INTRAVENOUS | Status: AC
  Filled 2024-06-02: qty 4

## 2024-06-02 MED ORDER — ACETAMINOPHEN 10 MG/ML IV SOLN: 1000.0000 mg | Freq: Once | INTRAVENOUS | Status: DC | PRN

## 2024-06-02 MED ORDER — MIDAZOLAM HCL 2 MG/2ML IJ SOLN
INTRAMUSCULAR | Status: AC
  Filled 2024-06-02: qty 2

## 2024-06-02 SURGICAL SUPPLY — 49 items
ADAPTER TITANIUM MEDIONICS (MISCELLANEOUS) ×1 IMPLANT
BAG DECANTER FOR FLEXI CONT (MISCELLANEOUS) ×1 IMPLANT
BIOPATCH RED 1 DISK 7.0 (GAUZE/BANDAGES/DRESSINGS) ×1 IMPLANT
BLADE CLIPPER SURG (BLADE) IMPLANT
BLADE SURG 11 STRL SS (BLADE) ×1 IMPLANT
CATH EXTENDED DIALYSIS (CATHETERS) IMPLANT
CHLORAPREP W/TINT 26 (MISCELLANEOUS) ×1 IMPLANT
CLIP APPLIE 5 13 M/L LIGAMAX5 (MISCELLANEOUS) IMPLANT
COVER SURGICAL LIGHT HANDLE (MISCELLANEOUS) ×1 IMPLANT
DERMABOND ADVANCED .7 DNX12 (GAUZE/BANDAGES/DRESSINGS) ×1 IMPLANT
DEVICE TROCAR PUNCTURE CLOSURE (ENDOMECHANICALS) ×1 IMPLANT
DRSG TEGADERM 4X4.75 (GAUZE/BANDAGES/DRESSINGS) ×3 IMPLANT
ELECTRODE REM PT RTRN 9FT ADLT (ELECTROSURGICAL) ×1 IMPLANT
GAUZE PAD ABD 8X10 STRL (GAUZE/BANDAGES/DRESSINGS) IMPLANT
GAUZE SPONGE 4X4 12PLY STRL (GAUZE/BANDAGES/DRESSINGS) ×1 IMPLANT
GLOVE INDICATOR 6.5 STRL GRN (GLOVE) ×1 IMPLANT
GLOVE SURG UNDER LTX SZ7.5 (GLOVE) ×1 IMPLANT
GOWN STRL REUS W/ TWL LRG LVL3 (GOWN DISPOSABLE) ×2 IMPLANT
GOWN STRL REUS W/ TWL XL LVL3 (GOWN DISPOSABLE) ×1 IMPLANT
GRASPER SUT TROCAR 14GX15 (MISCELLANEOUS) ×1 IMPLANT
IRRIGATION SUCT STRKRFLW 2 WTP (MISCELLANEOUS) IMPLANT
IV 0.9% NACL 1000 ML (IV SOLUTION) ×1 IMPLANT
KIT BASIN OR (CUSTOM PROCEDURE TRAY) ×1 IMPLANT
KIT TURNOVER KIT B (KITS) ×1 IMPLANT
NDL INSUFFLATION 14GA 120MM (NEEDLE) ×1 IMPLANT
NEEDLE INSUFFLATION 14GA 120MM (NEEDLE) ×1 IMPLANT
PAD ARMBOARD POSITIONER FOAM (MISCELLANEOUS) ×2 IMPLANT
POWDER SURGICEL 3.0 GRAM (HEMOSTASIS) IMPLANT
SCISSORS LAP 5X35 DISP (ENDOMECHANICALS) IMPLANT
SET CYSTO IRRIGATION (SET/KITS/TRAYS/PACK) ×1 IMPLANT
SET EXT 12IN DIALYSIS STAY-SAF (MISCELLANEOUS) ×1 IMPLANT
SET TUBE SMOKE EVAC HIGH FLOW (TUBING) ×1 IMPLANT
SLEEVE Z-THREAD 5X100MM (TROCAR) ×2 IMPLANT
SOLN 0.9% NACL POUR BTL 1000ML (IV SOLUTION) ×1 IMPLANT
SOLN STERILE WATER BTL 1000 ML (IV SOLUTION) ×1 IMPLANT
SPIKE FLUID TRANSFER (MISCELLANEOUS) ×1 IMPLANT
STYLET FALLER (MISCELLANEOUS) ×1 IMPLANT
STYLET FALLER MEDIONICS (MISCELLANEOUS) ×1 IMPLANT
SUT MNCRL AB 4-0 PS2 18 (SUTURE) ×1 IMPLANT
SUT PROLENE 0 SH 30 (SUTURE) ×2 IMPLANT
SUT SILK 0 TIES 10X30 (SUTURE) ×1 IMPLANT
TAPE CLOTH SURG 4X10 WHT LF (GAUZE/BANDAGES/DRESSINGS) IMPLANT
TOWEL GREEN STERILE (TOWEL DISPOSABLE) ×1 IMPLANT
TOWEL GREEN STERILE FF (TOWEL DISPOSABLE) ×1 IMPLANT
TRAY LAPAROSCOPIC MC (CUSTOM PROCEDURE TRAY) ×1 IMPLANT
TROCAR 11X100 Z THREAD (TROCAR) IMPLANT
TROCAR 5MMX150MM (TROCAR) ×1 IMPLANT
TROCAR XCEL NON-BLD 5MMX100MML (ENDOMECHANICALS) ×1 IMPLANT
TROCAR Z-THREAD OPTICAL 5X100M (TROCAR) ×1 IMPLANT

## 2024-06-02 NOTE — Transfer of Care (Signed)
 Immediate Anesthesia Transfer of Care Note  Patient: Maria Hudson  Procedure(s) Performed: INSERTION, CATHETER, DIALYSIS, PERITONEAL (Abdomen) LYSIS, ADHESIONS, LAPAROSCOPIC (Abdomen)  Patient Location: PACU  Anesthesia Type:General  Level of Consciousness: awake, alert , and oriented  Airway & Oxygen  Therapy: Patient Spontanous Breathing and Patient connected to face mask oxygen   Post-op Assessment: Report given to RN and Post -op Vital signs reviewed and stable  Post vital signs: Reviewed and stable  Last Vitals:  Vitals Value Taken Time  BP 194/78 06/02/24 14:20  Temp 36.7 C 06/02/24 14:17  Pulse 66 06/02/24 14:23  Resp 17 06/02/24 14:23  SpO2 90 % 06/02/24 14:23  Vitals shown include unfiled device data.  Last Pain:  Vitals:   06/02/24 1031  TempSrc: Oral         Complications: No notable events documented.

## 2024-06-02 NOTE — Anesthesia Procedure Notes (Signed)
 Procedure Name: Intubation Date/Time: 06/02/2024 12:51 PM  Performed by: Gershon Shorten J, CRNAPre-anesthesia Checklist: Patient identified, Emergency Drugs available, Suction available and Patient being monitored Patient Re-evaluated:Patient Re-evaluated prior to induction Oxygen  Delivery Method: Circle System Utilized Preoxygenation: Pre-oxygenation with 100% oxygen  Induction Type: IV induction Ventilation: Mask ventilation without difficulty and Oral airway inserted - appropriate to patient size Laryngoscope Size: Glidescope and 3 Grade View: Grade I Tube type: Oral Tube size: 7.0 mm Number of attempts: 1 Airway Equipment and Method: Stylet and Oral airway Placement Confirmation: ETT inserted through vocal cords under direct vision, positive ETCO2 and breath sounds checked- equal and bilateral Secured at: 21 cm Tube secured with: Tape Dental Injury: Teeth and Oropharynx as per pre-operative assessment

## 2024-06-02 NOTE — H&P (Signed)
 "   HPI:   Maria Hudson is a 63 y.o. female history of end-stage renal disease currently dialyzing Mondays, Tuesdays, Thursdays and Fridays via left upper arm AV fistula with plans for transition to home.  She takes aspirin  no other blood thinners.  We have previously evaluated her for peritoneal dialysis catheter placement but due to her weight and previous abdominal surgeries which include emergent cesarean section and tubal ligation we elected for hemodialysis.  She is now here for placement of peritoneal dialysis catheter       Past Medical History:  Diagnosis Date   Acute on chronic diastolic heart failure (HCC) 03/05/2021   Anemia     Arthritis     CAD (coronary artery disease)     CHF (congestive heart failure) (HCC)     Colon polyps     CVA (cerebral vascular accident) (HCC)     Depression     Diabetes mellitus without complication (HCC)      type 2   Difficult intubation     Dyspnea     ESRD on hemodialysis (HCC)     Hypertensive urgency 04/14/2021   Memory loss      mild   OSA (obstructive sleep apnea) 05/27/2021    uses CPAP 12-18 cm   Pneumonia      several times   Pure hypercholesterolemia 05/27/2021   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   Stroke [facility])               Family History  Problem Relation Age of Onset   Hypertension Mother     Stroke Mother     Diabetes Mother     Other Mother          enlarged heart   Hypertension Father     Hypertension Sister     Diabetes Sister     CAD Sister          enlarged heart   Kidney disease Sister     Diabetes Paternal Grandmother     Hypertension Daughter     Diabetes Daughter     Hypertension Son     Diabetes Son     Breast cancer Paternal Aunt               Past Surgical History:  Procedure Laterality Date   A/V FISTULAGRAM Left 08/09/2023    Procedure: A/V Fistulagram;  Surgeon: Sheree Penne Bruckner, MD;  Location: [facility] INVASIVE CV LAB;  Service: Cardiovascular;  Laterality: Left;   A/V SHUNT  INTERVENTION N/A 12/15/2023    Procedure: A/V SHUNT INTERVENTION;  Surgeon: Pearline Norman GORMAN, MD;  Location: [facility];  Service: Cardiovascular;  Laterality: N/A;   ABDOMINAL HYSTERECTOMY       AV FISTULA PLACEMENT Left 12/09/2021    Procedure: LEFT ARTERIOVENOUS (AV) FISTULA CREATION;  Surgeon: Sheree Penne Bruckner, MD;  Location: [facility] OR;  Service: Vascular;  Laterality: Left;   BIOPSY   09/08/2021    Procedure: BIOPSY;  Surgeon: Legrand Victory LITTIE DOUGLAS, MD;  [facility];  Service: Gastroenterology;;   CESAREAN SECTION        x 1   ESOPHAGOGASTRODUODENOSCOPY (EGD) WITH PROPOFOL  N/A 09/08/2021    Procedure: ESOPHAGOGASTRODUODENOSCOPY (EGD) WITH PROPOFOL ;  Surgeon: Legrand Victory LITTIE DOUGLAS, MD;  [facility];  Service: Gastroenterology;  Laterality: N/A;  chest pain, epigastric pain   FISTULA SUPERFICIALIZATION Left 08/11/2023    Procedure: FISTULA SUPERFICIALIZATION LEFT UPPER ARM;  Surgeon: Magda Debby SAILOR, MD;  Location[facility];  Service: Vascular;  Laterality: Left;   INNER EAR SURGERY Bilateral     INSERTION OF DIALYSIS CATHETER Right 08/11/2023    Procedure: INSERTION OF DIALYSIS CATHETER;  Surgeon: Magda Debby SAILOR, MD;  Location[facility] OR;  Service: Vascular;  Laterality: Right;   RIGHT HEART CATH N/A 05/14/2021    Procedure: RIGHT HEART CATH;  Surgeon: Rolan Ezra RAMAN, MD;  Location: [facility] INVASIVE CV LAB;  Service: Cardiovascular;  Laterality: N/A;   TONSILLECTOMY        age 61          Short Social History:  Social History         Tobacco Use   Smoking status: Never      Passive exposure: Never   Smokeless tobacco: Never  Substance Use Topics   Alcohol use: Not Currently      Allergies       Allergies  Allergen Reactions   Metformin And Related Nausea Only   Other        Clonidine  patch causes skin irritation    Hydrocodone Itching      Patient able to tolerate when taken with Benadryl.   Oxycodone -Acetaminophen  Itching      Patient able to tolerate when  taken with Benadryl.              Current Outpatient Medications  Medication Sig Dispense Refill   Accu-Chek Softclix Lancets lancets 3 (three) times daily.       acetaminophen  (TYLENOL ) 500 MG tablet Take 1,000 mg by mouth every 6 (six) hours as needed for moderate pain or headache.       allopurinol  (ZYLOPRIM ) 100 MG tablet Take 100 mg by mouth in the morning.       aspirin  EC 81 MG tablet Take 81 mg by mouth in the morning. Swallow whole.       atorvastatin  (LIPITOR) 40 MG tablet Take 1 tablet (40 mg total) by mouth every evening. 90 tablet 3   buPROPion  (WELLBUTRIN ) 100 MG tablet Take 100 mg by mouth 2 (two) times daily.       carvedilol  (COREG ) 25 MG tablet Take 1 tablet (25 mg total) by mouth 2 (two) times daily with a meal. 60 tablet 0   cholecalciferol (VITAMIN D3) 25 MCG (1000 UNIT) tablet Take 2,000 Units by mouth daily.       Continuous Blood Gluc Transmit (DEXCOM G6 TRANSMITTER) MISC Use one every 3 month       dapagliflozin  propanediol (FARXIGA ) 10 MG TABS tablet TAKE 1 TABLET BY MOUTH DAILY BEFORE BREAKFAST. 30 tablet 11   ferrous sulfate  325 (65 FE) MG tablet Take 325 mg by mouth 2 (two) times daily.       fluticasone  (FLONASE ) 50 MCG/ACT nasal spray Place 1 spray into both nostrils daily as needed for allergies or rhinitis.       hydrALAZINE  (APRESOLINE ) 100 MG tablet TAKE 1 TABLET BY MOUTH 3 TIMES DAILY. (Patient taking differently: Take 50 mg by mouth 3 (three) times daily.) 270 tablet 2   insulin  glargine (LANTUS ) 100 unit/mL SOPN Inject 0-10 Units into the skin at bedtime as needed (if blood gluose is above 130 take 2 units 150 and above 10 units).       isosorbide  mononitrate (IMDUR ) 120 MG 24 hr tablet Take 120 mg by mouth at bedtime.       LINZESS  145 MCG CAPS capsule TAKE 1 CAPSULE BY MOUTH DAILY BEFORE BREAKFAST. 30 capsule 11   loratadine  (CLARITIN ) 10 MG  tablet Take 10 mg by mouth in the morning.       Multiple Vitamin (MULTIVITAMIN ADULT) TABS Take 1 tablet by  mouth daily with lunch.       nitroGLYCERIN  (NITROSTAT ) 0.4 MG SL tablet Place 1 tablet (0.4 mg total) under the tongue every 5 (five) minutes as needed for chest pain. 20 tablet 0   NOVOLOG  FLEXPEN 100 UNIT/ML FlexPen Inject 5 Units into the skin 3 (three) times daily with meals.       Omega-3 Fatty Acids (FISH OIL) 1000 MG CAPS Take 1,000 mg by mouth daily.       OZEMPIC , 1 MG/DOSE, 4 MG/3ML SOPN Inject 1 mg into the skin every Tuesday.       pregabalin  (LYRICA ) 75 MG capsule Take 75 mg by mouth 2 (two) times daily.       sertraline  (ZOLOFT ) 50 MG tablet Take 50 mg by mouth in the morning.       sucroferric oxyhydroxide (VELPHORO ) 500 MG chewable tablet Chew 1 tablet (500 mg total) by mouth 3 (three) times daily with meals. 90 tablet 0   tiZANidine (ZANAFLEX) 4 MG tablet Take 4 mg by mouth 2 (two) times daily as needed for muscle spasms.       traMADol  (ULTRAM ) 50 MG tablet Take 1 tablet (50 mg total) by mouth every 12 (twelve) hours as needed. 6 tablet 0   traZODone  (DESYREL ) 50 MG tablet Take 50 mg by mouth at bedtime.       TURMERIC CURCUMIN PO Take 1,200 mg by mouth daily at 6 (six) AM. 600 mg each Chew          No current facility-administered medications for this visit.             Facility-Administered Medications Ordered in Other Visits  Medication Dose Route Frequency Provider Last Rate Last Admin   technetium pyrophosphate Tc 41m injection 21.2 millicurie  21.2 millicurie Intravenous Once Hilty, Vinie BROCKS, MD            Review of Systems  Constitutional: Negative for unexpected weight change.       Increased appetite  HENT: HENT negative.  Eyes: Eyes negative.  GI: Gastrointestinal negative.  Musculoskeletal: Musculoskeletal negative.  Skin: Skin negative.  Hematologic: Hematologic/lymphatic negative.  Psychiatric: Psychiatric negative.          Objective:    Vitals:   06/02/24 1031  BP: (!) 147/72  Pulse: 73  Resp: 18  Temp: 98.4 F (36.9 C)  SpO2: 95%       Physical Exam HENT:     Head: Normocephalic.     Nose: Nose normal.  Eyes:     Pupils: Pupils are equal, round, and reactive to light.  Abdominal:     Comments: Well-healed low midline incision  Musculoskeletal:        General: Normal range of motion.     Right lower leg: No edema.     Left lower leg: No edema.  Skin:    General: Skin is warm.     Capillary Refill: Capillary refill takes less than 2 seconds.  Neurological:     General: No focal deficit present.     Mental Status: She is alert.  Psychiatric:        Mood and Affect: Mood normal.          Assessment/Plan:    63 year old female with end-stage renal disease dialyzing via left upper arm AV fistula.  Patient is very hopeful to  have peritoneal dialysis.  We again discussed that given her size and previous abdominal surgeries she is not an ideal candidate for peritoneal dialysis but she remains persistent to proceed.  We will plan for diagnostic laparoscopy with possible catheter placement today in the OR.  We again discussed the risks particularly of injury to intra-abdominal structures given her previous surgeries and size.  We also again discussed the risk of nonfunction of the catheter which would require an additional general anesthetic for removal.  She continues to demonstrate very good understanding of all of this and agrees to proceed and consent was signed    Penne C. Sheree, MD [facility] Office: [phone] Pager: [phone]    "

## 2024-06-02 NOTE — Op Note (Signed)
 "   Patient name: Maria Hudson MRN: [id number] DOB: Nov 21, 1960 Sex: female  06/02/2024 Pre-operative Diagnosis: End-stage renal disease Post-operative diagnosis:  Same Surgeon:  Penne C. Sheree, MD Assistant: Curry Damme, PA Procedure Performed:  Laparoscopic placement continuous ambulatory peritoneal dialysis catheter, laparoscopic lysis of intra-abdominal adhesions for 15 minutes  Indications: 63 year old female with history of end-stage renal disease currently dialyzing via left arm AV fistula which is working well however patient desires transitioning to home dialysis and also has significant pain in her fistula.  We have discussed that she is a high risk patient for proceeding with peritoneal dialysis catheter.  She understands the risk and benefits and has agreed to proceed and consent was signed.  Experience assistant was necessary to facilitate camera driving as well as accurate placement of laparoscopic PD catheter  Findings: There were dense adhesions in the midline.  We were able to lyse adhesions both on the patient's right hemiabdomen as well as in the pelvis in an effort to get the catheter placed in the midline from the patient's left.  At completion the catheter quickly instilled 800 cc of saline at gravity and almost all this was returned briskly.   Procedure:  The patient was identified in the holding area and taken to the operating room where she was placed supine operative table general anesthesia was induced.  She was sterilely prepped and draped in the abdomen in the usual fashion, antibiotics were administered and a timeout was called.  We began with various needle to finger breaths from the left costal margin.  An 11 blade was used to nick the skin.  The Veress needle was then placed and did flush saline easily.  She was then connected to low flow CO2 and then high flow and the abdomen was noted to insufflate easily.  An Optiview trocar was used in the right upper  quadrant 3 fingerbreadths from the costal margin.  We were able to gain direct access to the abdomen using a 0 view camera.  We then switched to a 30 degree camera and evaluated the abdomen there were noted to be no injuries with the berries and the Veress was removed.  We then placed a second trocar in the right mid abdomen 1 handbreadth from the previous trocar under direct visualization.  She was placed in Trendelenburg position.  There were multiple adhesions we began taking these down sharply using scissors.  The camera was directed cephalad and around the adhesions and we were able to visualize near the umbilicus on the left lateral abdomen.  A transverse incision was created and we used the trocar to first attempt to the peritoneum directed inferiorly and then entered the abdomen sharply.  The catheter was placed into the abdomen but I was unable to grasp this.  In the pelvis I then lysed further adhesions and was able to grab around the adhesions in the pelvis from the right side the trocar entering on the left.  We then placed the catheter all the way into the abdomen were able to visualize the cuff into the abdomen and then pulled this back just inside the peritoneum.  The entire catheter was then assembled outside of the body and then tunneled cephalad to the left costal margin and then out the left lateral sidewall.  We again reinspected the abdomen it appeared to have no kinking and the catheter did sit well in the pelvis.  The patient was then placed in reverse Trendelenburg and 800 cc of saline  was briskly instilled the gravity and the bag was then placed on the floor and this briskly returned.  Satisfied with this we then removed the trocars.  All port incisions were closed with 4-0 Monocryl and Dermabond was placed at the skin level.  She was then awakened from anesthesia having tolerated the procedure without Ameeth complication.  All counts were correct at completion.   EBL: 20 cc  Fatmata Legere C.  Sheree, MD [facility] Office: [phone] Pager: [phone]   "

## 2024-06-03 NOTE — Anesthesia Postprocedure Evaluation (Signed)
"   Anesthesia Post Note  Patient: Maria Hudson  Procedure(s) Performed: INSERTION, CATHETER, DIALYSIS, PERITONEAL (Abdomen) LYSIS, ADHESIONS, LAPAROSCOPIC (Abdomen)     Patient location during evaluation: PACU Anesthesia Type: General Level of consciousness: awake and alert Pain management: pain level controlled Vital Signs Assessment: post-procedure vital signs reviewed and stable Respiratory status: spontaneous breathing, nonlabored ventilation, respiratory function stable and patient connected to nasal cannula oxygen  Cardiovascular status: blood pressure returned to baseline and stable Postop Assessment: no apparent nausea or vomiting Anesthetic complications: no   No notable events documented.  Last Vitals:  Vitals:   06/02/24 1445 06/02/24 1500  BP: (!) 186/73 (!) 164/57  Pulse: 63 64  Resp: 12 12  Temp:  36.7 C  SpO2: 93% 99%    Last Pain:  Vitals:   06/02/24 1445  TempSrc:   PainSc: 0-No pain                 [name]      "

## 2024-06-04 ENCOUNTER — Encounter (HOSPITAL_COMMUNITY): Payer: Self-pay | Admitting: Vascular Surgery

## 2024-06-05 ENCOUNTER — Ambulatory Visit: Admitting: Gastroenterology

## 2024-06-05 NOTE — Progress Notes (Deleted)
 [address]  Maria Hudson [id number] 25-Apr-1961   Chief Complaint:  Referring Provider: Shelda Atlas, MD Primary GI MD: Dr. Legrand  HPI: Maria Hudson is a 63 y.o. female with past medical history of CAD, CHF, colon polyps, prior CVA, T2DM, documented difficult intubation, ESRD on hemodialysis, previous MI, OSA, HLD, HTN who presents today to discuss colonoscopy.    Patient last seen in office 04/07/2023 by Dr. Legrand for chronic constipation.  This is believed to be multifactorial.  Patient has markedly redundant colon anatomy, constipation possible side effect of iron tablets, and also may have some pelvic floor dysfunction.  Possibility of rectocele.  Also endorsed some fecal incontinence.  Plan was to use a glycerin suppository as needed, every other day if she has not had a bowel movement for 3 days, use a squatty potty, referred to gynecology for formal pelvic exam to evaluate possibility of pelvic floor muscle weakness and/or rectocele. Due for repeat colonoscopy July 2025 due to poor prep on previous exam.  Labs were repeated to evaluate her anemia, with consideration for discontinuation of iron tablets of normal hopefully help with constipation.  Ferritin was high, has been on iron supplementation without improvement in hemoglobin and was advised to discontinue iron supplements.  B12 and folate were normal.  Discussed the use of AI scribe software for clinical note transcription with the patient, who gave verbal consent to proceed.  History of Present Illness       Previous GI Procedures/Imaging   Colonoscopy 01/06/2023 (initially scheduled for 7/22 but regular prep inadequate, drink additional GoLytely /MiraLAX  prep for exam, and performed by Dr. Leigh)  - Three 4 to 5 mm polyps in the transverse colon, removed with a cold snare. Resected and retrieved.  - One 3 to 4 mm polyp in the distal rectum, removed with a cold snare. Resected and retrieved.  - Stool in the entire  examined colon, extensive lavage performed as above - mostly cleared with exception of portion of cecum and very small areas of ascending colon and splenic flexure.  - Internal hemorrhoids.  - The examination was otherwise normal. Path: 1. Surgical [P], colon, transverse, polyp (3) - HYPERPLASTIC POLYPS. 2. Surgical [P], colon, rectum, polyp (1) - HYPERPLASTIC POLYPS WITH EDEMA.  EGD 09/08/2021 - Normal esophagus.  - Gastritis.  - Mucosal changes in the duodenum.  - Several biopsies were obtained on the posterior wall of the gastric body, on the greater curvature of the gastric antrum and on the lesser curvature of the gastric antrum.  Patient' s episodic pain in left - sided under breast, non exertional and not clearly meal - related. Small gallstones seen on recent CTAP, not clear if related to pain.  Past Medical History:  Diagnosis Date   Acute on chronic diastolic heart failure (HCC) 03/05/2021   Anemia    Arthritis    CAD (coronary artery disease)    CHF (congestive heart failure) (HCC)    Colon polyps    CVA (cerebral vascular accident) (HCC)    Depression    Diabetes mellitus without complication (HCC)    type 2   Difficult intubation    Dyspnea    ESRD on hemodialysis (HCC)    Heart murmur    Hypertensive urgency 04/14/2021   Memory loss    mild   Myocardial infarction (HCC)    OSA (obstructive sleep apnea) 05/27/2021   uses CPAP 12-18 cm   Pneumonia    several times   Pure hypercholesterolemia 05/27/2021  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   Stroke [facility])     Past Surgical History:  Procedure Laterality Date   A/V FISTULAGRAM Left 08/09/2023   Procedure: A/V Fistulagram;  Surgeon: Sheree Penne Bruckner, MD;  Location: [facility] INVASIVE CV LAB;  Service: Cardiovascular;  Laterality: Left;   A/V FISTULAGRAM Left 03/15/2024   Procedure: A/V Fistulagram;  Surgeon: Pearline Norman RAMAN, MD;  Location: [facility] PV LAB;  Service: Cardiovascular;  Laterality: Left;   A/V SHUNT  INTERVENTION N/A 12/15/2023   Procedure: A/V SHUNT INTERVENTION;  Surgeon: Pearline Norman RAMAN, MD;  Location: [facility] LAB;  Service: Cardiovascular;  Laterality: N/A;   A/V SHUNT INTERVENTION Left 01/21/2024   Procedure: A/V SHUNT INTERVENTION;  Surgeon: Serene Gaile ORN, MD;  Location: [facility] LAB;  Service: Cardiovascular;  Laterality: Left;   ABDOMINAL HYSTERECTOMY     AV FISTULA PLACEMENT Left 12/09/2021   Procedure: LEFT ARTERIOVENOUS (AV) FISTULA CREATION;  Surgeon: Sheree Penne Bruckner, MD;  Location[facility] OR;  Service: Vascular;  Laterality: Left;   BIOPSY  09/08/2021   Procedure: BIOPSY;  Surgeon: Legrand Victory LITTIE DOUGLAS, MD;  Location: [facility] ENDOSCOPY;  Service: Gastroenterology;;   CESAREAN SECTION     x 1   ESOPHAGOGASTRODUODENOSCOPY (EGD) WITH PROPOFOL  N/A 09/08/2021   Procedure: ESOPHAGOGASTRODUODENOSCOPY (EGD) WITH PROPOFOL ;  Surgeon: Legrand Victory LITTIE DOUGLAS, MD;  Location: [facility]OSCOPY;  Service: Gastroenterology;  Laterality: N/A;  chest pain, epigastric pain   FISTULA SUPERFICIALIZATION Left 08/11/2023   Procedure: FISTULA SUPERFICIALIZATION LEFT UPPER ARM;  Surgeon: Magda Debby SAILOR, MD;  Location: [facility] OR;  Service: Vascular;  Laterality: Left;   INNER EAR SURGERY Bilateral    INSERTION OF DIALYSIS CATHETER Right 08/11/2023   Procedure: INSERTION OF DIALYSIS CATHETER;  Surgeon: Magda Debby SAILOR, MD;  Location: [facility] OR;  Service: Vascular;  Laterality: Right;   LAPAROSCOPIC LYSIS OF ADHESIONS N/A 06/02/2024   Procedure: LYSIS, ADHESIONS, LAPAROSCOPIC;  Surgeon: Sheree Penne Bruckner, MD;  Location: [facility] OR;  Service: Vascular;  Laterality: N/A;   RIGHT HEART CATH N/A 05/14/2021   Procedure: RIGHT HEART CATH;  Surgeon: Rolan Ezra RAMAN, MD;  Location: [facility] INVASIVE CV LAB;  Service: Cardiovascular;  Laterality: N/A;   TENCKHOFF CATHETER INSERTION N/A 06/02/2024   Procedure: INSERTION, CATHETER, DIALYSIS, PERITONEAL;  Surgeon: Sheree Penne Bruckner, MD;  Location: [facility] OR;  Service: Vascular;   Laterality: N/A;   TONSILLECTOMY     age 55   VENOUS ANGIOPLASTY  01/21/2024   Procedure: VENOUS ANGIOPLASTY;  Surgeon: Serene Gaile ORN, MD;  Location: [facility];  Service: Cardiovascular;;  Cephalic Vein   VENOUS ANGIOPLASTY Left 03/15/2024   Procedure: VENOUS ANGIOPLASTY;  Surgeon: Pearline Norman RAMAN, MD;  Location: [facility];  Service: Cardiovascular;  Laterality: Left;  Mid-AVF/Cephalic Vein; Cephalic Arch    Current Outpatient Medications  Medication Sig Dispense Refill   acetaminophen  (TYLENOL ) 500 MG tablet Take 1,000 mg by mouth every 6 (six) hours as needed for moderate pain or headache.     allopurinol  (ZYLOPRIM ) 100 MG tablet Take 100 mg by mouth in the morning.     aspirin  EC 81 MG tablet Take 81 mg by mouth in the morning. Swallow whole.     atorvastatin  (LIPITOR) 80 MG tablet Take 1 tablet (80 mg total) by mouth every evening. 90 tablet 1   buPROPion  (WELLBUTRIN ) 100 MG tablet Take 100 mg by mouth 2 (two) times daily.     calcitRIOL (ROCALTROL) 0.5 MCG capsule Take 0.5 mcg by mouth in the morning. (Patient not  taking: Reported on 05/30/2024)     carvedilol  (COREG ) 3.125 MG tablet Take 3.125 mg by mouth 2 (two) times daily.     Cholecalciferol (D3) 25 MCG (1000 UT) capsule Take 1,000 Units by mouth daily.     diphenhydrAMINE (BENADRYL) 25 MG tablet Take 25 mg by mouth every 6 (six) hours as needed for allergies or itching.     ezetimibe  (ZETIA ) 10 MG tablet Take 1 tablet (10 mg total) by mouth daily. 90 tablet 3   fluticasone  (FLONASE ) 50 MCG/ACT nasal spray Place 1 spray into both nostrils daily as needed for allergies or rhinitis.     isosorbide  mononitrate (IMDUR ) 120 MG 24 hr tablet Take 120 mg by mouth at bedtime.     LINZESS  145 MCG CAPS capsule TAKE 1 CAPSULE BY MOUTH DAILY BEFORE BREAKFAST. 30 capsule 11   loratadine  (CLARITIN ) 10 MG tablet Take 10 mg by mouth in the morning.     Multiple Vitamin (MULTIVITAMIN ADULT) TABS Take 1 tablet by mouth daily with lunch.      nitroGLYCERIN  (NITROSTAT ) 0.4 MG SL tablet Place 1 tablet (0.4 mg total) under the tongue every 5 (five) minutes as needed for chest pain. 20 tablet 0   NOVOLOG  FLEXPEN 100 UNIT/ML FlexPen Inject 5 Units into the skin 3 (three) times daily as needed for high blood sugar.     Omega-3 Fatty Acids (FISH OIL PO) Take 2,400 mg by mouth daily with lunch.     OZEMPIC , 2 MG/DOSE, 8 MG/3ML SOPN Inject 2 mg into the skin every 7 (seven) days.     pregabalin  (LYRICA ) 75 MG capsule Take 75 mg by mouth 2 (two) times daily.     sertraline  (ZOLOFT ) 50 MG tablet Take 50 mg by mouth in the morning.     sucroferric oxyhydroxide (VELPHORO ) 500 MG chewable tablet Chew 500 mg by mouth 3 (three) times daily with meals.     torsemide  (DEMADEX ) 20 MG tablet Take 40 mg by mouth See admin instructions. Take 2 tablets (40 mg) by mouth on Wednesdays, Saturdays and Sundays in the morning (non dialysis days)     traMADol  (ULTRAM ) 50 MG tablet Take 1 tablet (50 mg total) by mouth every 6 (six) hours as needed for severe pain (pain score 7-10). 20 tablet 0   traZODone  (DESYREL ) 50 MG tablet Take 50 mg by mouth at bedtime.     TURMERIC CURCUMIN PO Take 2,000 mg by mouth daily with lunch.     No current facility-administered medications for this visit.   Facility-Administered Medications Ordered in Other Visits  Medication Dose Route Frequency Provider Last Rate Last Admin   technetium pyrophosphate Tc 9m injection 21.2 millicurie  21.2 millicurie Intravenous Once Mona Vinie BROCKS, MD        Allergies as of 06/05/2024 - Review Complete 06/02/2024  Allergen Reaction Noted   Metformin and related Nausea Only 09/30/2021   Other  12/24/2021   Hydrocodone Itching 05/28/2015   Oxycodone -acetaminophen  Itching 02/25/2012    Family History  Problem Relation Age of Onset   Hypertension Mother    Stroke Mother    Diabetes Mother    Other Mother        enlarged heart   Hypertension Father    Hypertension Sister    Diabetes  Sister    CAD Sister        enlarged heart   Kidney disease Sister    Diabetes Paternal Grandmother    Hypertension Daughter    Diabetes Daughter  Hypertension Son    Diabetes Son    Breast cancer Paternal Aunt     Social History[1]   Review of Systems:    Constitutional: No weight loss, fever, chills, weakness or fatigue Eyes: No change in vision Ears, Nose, Throat:  No change in hearing or congestion Skin: No rash or itching Cardiovascular: No chest pain, chest pressure or palpitations   Respiratory: No SOB or cough Gastrointestinal: See HPI and otherwise negative Genitourinary: No dysuria or change in urinary frequency Neurological: No headache, dizziness or syncope Musculoskeletal: No new muscle or joint pain Hematologic: No bleeding or bruising    Physical Exam:  Vital signs: There were no vitals taken for this visit.  Constitutional: NAD, Well developed, Well nourished, alert and cooperative Head:  Normocephalic and atraumatic.  Eyes: No scleral icterus. Conjunctiva pink. Mouth: No oral lesions. Respiratory: Respirations even and unlabored. Lungs clear to auscultation bilaterally.  No wheezes, crackles, or rhonchi.  Cardiovascular:  Regular rate and rhythm. No murmurs. No peripheral edema. Gastrointestinal:  Soft, nondistended, nontender. No rebound or guarding. Normal bowel sounds. No appreciable masses or hepatomegaly. Rectal:  Not performed.  Neurologic:  Alert and oriented x4;  grossly normal neurologically.  Skin:   Dry and intact without significant lesions or rashes. Psychiatric: Oriented to person, place and time. Demonstrates good judgement and reason without abnormal affect or behaviors.   RELEVANT LABS AND IMAGING: CBC    Component Value Date/Time   WBC 6.3 12/18/2023 1031   RBC 4.57 12/18/2023 1031   HGB 11.6 (L) 06/02/2024 1115   HGB 10.9 (L) 02/22/2023 1520   HCT 34.0 (L) 06/02/2024 1115   HCT 35.7 02/22/2023 1520   PLT 216 12/18/2023 1031    PLT 227 02/22/2023 1520   MCV 83.2 12/18/2023 1031   MCV 89 02/22/2023 1520   MCH 27.1 12/18/2023 1031   MCHC 32.6 12/18/2023 1031   RDW 17.4 (H) 12/18/2023 1031   RDW 15.4 02/22/2023 1520   LYMPHSABS 0.2 (L) 10/11/2023 2215   LYMPHSABS 0.8 02/22/2023 1520   MONOABS 0.5 10/11/2023 2215   EOSABS 0.0 10/11/2023 2215   EOSABS 0.1 02/22/2023 1520   BASOSABS 0.0 10/11/2023 2215   BASOSABS 0.1 02/22/2023 1520    CMP     Component Value Date/Time   NA 134 (L) 06/02/2024 1115   NA 144 02/22/2023 1520   K 3.8 06/02/2024 1115   CL 93 (L) 06/02/2024 1115   CO2 26 12/18/2023 1031   GLUCOSE 89 06/02/2024 1115   BUN 66 (H) 06/02/2024 1115   BUN 54 (H) 02/22/2023 1520   CREATININE 6.80 (H) 06/02/2024 1115   CALCIUM  9.2 12/18/2023 1031   PROT 7.1 05/17/2024 0908   PROT 6.5 02/22/2023 1520   ALBUMIN 3.5 05/17/2024 0908   ALBUMIN 4.0 02/22/2023 1520   AST 23 05/17/2024 0908   ALT 11 05/17/2024 0908   ALKPHOS 44 05/17/2024 0908   BILITOT 0.4 05/17/2024 0908   BILITOT <0.2 02/22/2023 1520   GFRNONAA 5 (L) 12/18/2023 1031   Echocardiogram 10/28/2022 1. Left ventricular ejection fraction, by estimation, is 60 to 65% . The left ventricle has normal function. The left ventricle has no regional wall motion abnormalities. There is moderate left ventricular hypertrophy. Left ventricular diastolic parameters are consistent with Grade I diastolic dysfunction ( impaired relaxation) .  2. Right ventricular systolic function is normal. The right ventricular size is normal. There is normal pulmonary artery systolic pressure.  3. Left atrial size was mildly dilated.  4.  The mitral valve is normal in structure. No evidence of mitral valve regurgitation.  5. The aortic valve is normal in structure. Aortic valve regurgitation is not visualized.  6. The inferior vena cava is normal in size with greater than 50% respiratory variability, suggesting right atrial pressure of 3 mmHg.  Assessment/Plan:    Assessment & Plan   Colonoscopy will need to be done in hospital setting based on patient's medical history which includes ESRD on hemodialysis and remote history of difficult intubation     Camie Furbish, PA-C [facility] 06/05/2024, 8:56 AM  Patient Care Team: Shelda Atlas, MD as PCP - General (Internal Medicine) Lonni Slain, MD as PCP - Cardiology (Cardiology) Burnard Debby LABOR, MD (Inactive) as PCP - Sleep Medicine (Cardiology) [facility], [facility]      [1]  Social History Tobacco Use   Smoking status: Never    Passive exposure: Never   Smokeless tobacco: Never  Vaping Use   Vaping status: Never Used  Substance Use Topics   Alcohol use: Not Currently   Drug use: Never   "

## 2024-06-16 ENCOUNTER — Inpatient Hospital Stay (HOSPITAL_BASED_OUTPATIENT_CLINIC_OR_DEPARTMENT_OTHER)
Admission: EM | Admit: 2024-06-16 | Discharge: 2024-06-25 | DRG: 371 | Disposition: A | Discharge status: Home / Self Care | Attending: Internal Medicine | Admitting: Internal Medicine

## 2024-06-16 ENCOUNTER — Emergency Department (HOSPITAL_BASED_OUTPATIENT_CLINIC_OR_DEPARTMENT_OTHER)

## 2024-06-16 ENCOUNTER — Other Ambulatory Visit: Payer: Self-pay

## 2024-06-16 ENCOUNTER — Encounter (HOSPITAL_BASED_OUTPATIENT_CLINIC_OR_DEPARTMENT_OTHER): Payer: Self-pay

## 2024-06-16 ENCOUNTER — Emergency Department (HOSPITAL_BASED_OUTPATIENT_CLINIC_OR_DEPARTMENT_OTHER): Admitting: Radiology

## 2024-06-16 DIAGNOSIS — E7800 Pure hypercholesterolemia, unspecified: Secondary | ICD-10-CM | POA: Diagnosis present

## 2024-06-16 DIAGNOSIS — M545 Low back pain, unspecified: Secondary | ICD-10-CM

## 2024-06-16 DIAGNOSIS — E1122 Type 2 diabetes mellitus with diabetic chronic kidney disease: Secondary | ICD-10-CM | POA: Diagnosis present

## 2024-06-16 DIAGNOSIS — Z888 Allergy status to other drugs, medicaments and biological substances status: Secondary | ICD-10-CM

## 2024-06-16 DIAGNOSIS — A0472 Enterocolitis due to Clostridium difficile, not specified as recurrent: Secondary | ICD-10-CM

## 2024-06-16 DIAGNOSIS — Z7985 Long-term (current) use of injectable non-insulin antidiabetic drugs: Secondary | ICD-10-CM

## 2024-06-16 DIAGNOSIS — E119 Type 2 diabetes mellitus without complications: Secondary | ICD-10-CM

## 2024-06-16 DIAGNOSIS — Z8249 Family history of ischemic heart disease and other diseases of the circulatory system: Secondary | ICD-10-CM

## 2024-06-16 DIAGNOSIS — Z885 Allergy status to narcotic agent status: Secondary | ICD-10-CM

## 2024-06-16 DIAGNOSIS — G4733 Obstructive sleep apnea (adult) (pediatric): Secondary | ICD-10-CM | POA: Diagnosis present

## 2024-06-16 DIAGNOSIS — M898X9 Other specified disorders of bone, unspecified site: Secondary | ICD-10-CM | POA: Diagnosis present

## 2024-06-16 DIAGNOSIS — N2581 Secondary hyperparathyroidism of renal origin: Secondary | ICD-10-CM | POA: Diagnosis present

## 2024-06-16 DIAGNOSIS — G8929 Other chronic pain: Secondary | ICD-10-CM

## 2024-06-16 DIAGNOSIS — I132 Hypertensive heart and chronic kidney disease with heart failure and with stage 5 chronic kidney disease, or end stage renal disease: Secondary | ICD-10-CM | POA: Diagnosis present

## 2024-06-16 DIAGNOSIS — D631 Anemia in chronic kidney disease: Secondary | ICD-10-CM | POA: Diagnosis present

## 2024-06-16 DIAGNOSIS — Z79899 Other long term (current) drug therapy: Secondary | ICD-10-CM

## 2024-06-16 DIAGNOSIS — I252 Old myocardial infarction: Secondary | ICD-10-CM

## 2024-06-16 DIAGNOSIS — Z992 Dependence on renal dialysis: Secondary | ICD-10-CM

## 2024-06-16 DIAGNOSIS — E871 Hypo-osmolality and hyponatremia: Secondary | ICD-10-CM | POA: Diagnosis present

## 2024-06-16 DIAGNOSIS — I2510 Atherosclerotic heart disease of native coronary artery without angina pectoris: Secondary | ICD-10-CM | POA: Diagnosis present

## 2024-06-16 DIAGNOSIS — R509 Fever, unspecified: Principal | ICD-10-CM

## 2024-06-16 DIAGNOSIS — Z789 Other specified health status: Secondary | ICD-10-CM | POA: Diagnosis present

## 2024-06-16 DIAGNOSIS — Z1152 Encounter for screening for COVID-19: Secondary | ICD-10-CM

## 2024-06-16 DIAGNOSIS — E861 Hypovolemia: Secondary | ICD-10-CM | POA: Diagnosis present

## 2024-06-16 DIAGNOSIS — I10 Essential (primary) hypertension: Secondary | ICD-10-CM | POA: Diagnosis present

## 2024-06-16 DIAGNOSIS — Z803 Family history of malignant neoplasm of breast: Secondary | ICD-10-CM

## 2024-06-16 DIAGNOSIS — E66812 Obesity, class 2: Secondary | ICD-10-CM | POA: Diagnosis present

## 2024-06-16 DIAGNOSIS — Z8673 Personal history of transient ischemic attack (TIA), and cerebral infarction without residual deficits: Secondary | ICD-10-CM

## 2024-06-16 DIAGNOSIS — Z531 Procedure and treatment not carried out because of patient's decision for reasons of belief and group pressure: Secondary | ICD-10-CM | POA: Diagnosis present

## 2024-06-16 DIAGNOSIS — Z794 Long term (current) use of insulin: Secondary | ICD-10-CM

## 2024-06-16 DIAGNOSIS — I5032 Chronic diastolic (congestive) heart failure: Secondary | ICD-10-CM | POA: Diagnosis present

## 2024-06-16 DIAGNOSIS — E876 Hypokalemia: Secondary | ICD-10-CM | POA: Insufficient documentation

## 2024-06-16 DIAGNOSIS — Z833 Family history of diabetes mellitus: Secondary | ICD-10-CM

## 2024-06-16 DIAGNOSIS — Z823 Family history of stroke: Secondary | ICD-10-CM

## 2024-06-16 DIAGNOSIS — M1A9XX1 Chronic gout, unspecified, with tophus (tophi): Secondary | ICD-10-CM

## 2024-06-16 DIAGNOSIS — N179 Acute kidney failure, unspecified: Secondary | ICD-10-CM | POA: Diagnosis present

## 2024-06-16 DIAGNOSIS — N186 End stage renal disease: Secondary | ICD-10-CM | POA: Diagnosis present

## 2024-06-16 DIAGNOSIS — K581 Irritable bowel syndrome with constipation: Secondary | ICD-10-CM | POA: Diagnosis present

## 2024-06-16 DIAGNOSIS — F39 Unspecified mood [affective] disorder: Secondary | ICD-10-CM | POA: Diagnosis present

## 2024-06-16 DIAGNOSIS — E1140 Type 2 diabetes mellitus with diabetic neuropathy, unspecified: Secondary | ICD-10-CM | POA: Diagnosis present

## 2024-06-16 DIAGNOSIS — E860 Dehydration: Secondary | ICD-10-CM | POA: Diagnosis present

## 2024-06-16 DIAGNOSIS — G629 Polyneuropathy, unspecified: Secondary | ICD-10-CM

## 2024-06-16 DIAGNOSIS — Z6838 Body mass index (BMI) 38.0-38.9, adult: Secondary | ICD-10-CM

## 2024-06-16 DIAGNOSIS — Z8419 Family history of other disorders of kidney and ureter: Secondary | ICD-10-CM

## 2024-06-16 DIAGNOSIS — I951 Orthostatic hypotension: Secondary | ICD-10-CM | POA: Diagnosis present

## 2024-06-16 DIAGNOSIS — Z7982 Long term (current) use of aspirin: Secondary | ICD-10-CM

## 2024-06-16 DIAGNOSIS — K529 Noninfective gastroenteritis and colitis, unspecified: Secondary | ICD-10-CM

## 2024-06-16 HISTORY — DX: Enterocolitis due to Clostridium difficile, not specified as recurrent: A04.72

## 2024-06-16 LAB — RESPIRATORY PANEL BY PCR

## 2024-06-16 LAB — CBC
HCT: 33.4 % — ABNORMAL LOW (ref 36.0–46.0)
Hemoglobin: 11.4 g/dL — ABNORMAL LOW (ref 12.0–15.0)
MCH: 27.1 pg (ref 26.0–34.0)
MCHC: 34.1 g/dL (ref 30.0–36.0)
MCV: 79.5 fL — ABNORMAL LOW (ref 80.0–100.0)
Platelets: 239 K/uL (ref 150–400)
RBC: 4.2 MIL/uL (ref 3.87–5.11)
RDW: 17.2 % — ABNORMAL HIGH (ref 11.5–15.5)
WBC: 13.5 K/uL — ABNORMAL HIGH (ref 4.0–10.5)
nRBC: 0 % (ref 0.0–0.2)

## 2024-06-16 LAB — COMPREHENSIVE METABOLIC PANEL WITH GFR
ALT: <5 U/L (ref 0–44)
AST: 22 U/L (ref 15–41)
Albumin: 3.9 g/dL (ref 3.5–5.0)
Alkaline Phosphatase: 54 U/L (ref 38–126)
Anion gap: 22 — ABNORMAL HIGH (ref 5–15)
BUN: 107 mg/dL — ABNORMAL HIGH (ref 8–23)
CO2: 18 mmol/L — ABNORMAL LOW (ref 22–32)
Calcium: 8.7 mg/dL — ABNORMAL LOW (ref 8.9–10.3)
Chloride: 92 mmol/L — ABNORMAL LOW (ref 98–111)
Creatinine, Ser: 11.7 mg/dL — ABNORMAL HIGH (ref 0.44–1.00)
GFR, Estimated: 3 mL/min — ABNORMAL LOW
Glucose, Bld: 100 mg/dL — ABNORMAL HIGH (ref 70–99)
Potassium: 2.8 mmol/L — ABNORMAL LOW (ref 3.5–5.1)
Sodium: 132 mmol/L — ABNORMAL LOW (ref 135–145)
Total Bilirubin: 0.3 mg/dL (ref 0.0–1.2)
Total Protein: 7.2 g/dL (ref 6.5–8.1)

## 2024-06-16 LAB — C DIFFICILE QUICK SCREEN W PCR REFLEX
C Diff antigen: POSITIVE — AB
C Diff interpretation: DETECTED
C Diff toxin: POSITIVE — AB

## 2024-06-16 LAB — CBG MONITORING, ED
Glucose-Capillary: 110 mg/dL — ABNORMAL HIGH (ref 70–99)
Glucose-Capillary: 47 mg/dL — ABNORMAL LOW (ref 70–99)
Glucose-Capillary: 114 mg/dL — ABNORMAL HIGH (ref 70–99)

## 2024-06-16 LAB — MAGNESIUM: Magnesium: 2.1 mg/dL (ref 1.7–2.4)

## 2024-06-16 LAB — RESP PANEL BY RT-PCR (RSV, FLU A&B, COVID)  RVPGX2
Influenza A by PCR: NEGATIVE
Influenza B by PCR: NEGATIVE
Resp Syncytial Virus by PCR: NEGATIVE
SARS Coronavirus 2 by RT PCR: NEGATIVE

## 2024-06-16 LAB — LIPASE, BLOOD: Lipase: 30 U/L (ref 11–51)

## 2024-06-16 LAB — LACTIC ACID, PLASMA
Lactic Acid, Venous: 0.7 mmol/L (ref 0.5–1.9)
Lactic Acid, Venous: 0.8 mmol/L (ref 0.5–1.9)

## 2024-06-16 MED ORDER — CIPROFLOXACIN IN D5W 400 MG/200ML IV SOLN
400.0000 mg | Freq: Two times a day (BID) | INTRAVENOUS | Status: DC
  Administered 2024-06-16: 400 mg via INTRAVENOUS
  Filled 2024-06-16: qty 200

## 2024-06-16 MED ORDER — SODIUM CHLORIDE 0.9 % IV SOLN
2.0000 g | INTRAVENOUS | Status: DC
  Administered 2024-06-16: 2 g via INTRAVENOUS
  Filled 2024-06-16: qty 20

## 2024-06-16 MED ORDER — METRONIDAZOLE 500 MG/100ML IV SOLN: 500.0000 mg | Freq: Two times a day (BID) | INTRAVENOUS | Status: DC

## 2024-06-16 MED ORDER — IOHEXOL 300 MG/ML  SOLN
100.0000 mL | Freq: Once | INTRAMUSCULAR | Status: AC | PRN
  Administered 2024-06-16: 100 mL via INTRAVENOUS

## 2024-06-16 MED ORDER — CIPROFLOXACIN IN D5W 400 MG/200ML IV SOLN: 400.0000 mg | INTRAVENOUS | Status: DC

## 2024-06-16 MED ORDER — METRONIDAZOLE 500 MG/100ML IV SOLN
500.0000 mg | Freq: Two times a day (BID) | INTRAVENOUS | Status: DC
  Administered 2024-06-16: 500 mg via INTRAVENOUS
  Filled 2024-06-16: qty 100

## 2024-06-16 MED ORDER — POTASSIUM CHLORIDE CRYS ER 20 MEQ PO TBCR
40.0000 meq | EXTENDED_RELEASE_TABLET | Freq: Once | ORAL | Status: AC
  Administered 2024-06-16: 40 meq via ORAL
  Filled 2024-06-16: qty 2

## 2024-06-16 MED ORDER — INSULIN ASPART 100 UNIT/ML IJ SOLN
0.0000 [IU] | Freq: Three times a day (TID) | INTRAMUSCULAR | Status: DC
  Administered 2024-06-19 (×2): 1 [IU] via SUBCUTANEOUS
  Administered 2024-06-19: 2 [IU] via SUBCUTANEOUS
  Administered 2024-06-20 – 2024-06-21 (×3): 1 [IU] via SUBCUTANEOUS
  Administered 2024-06-21: 2 [IU] via SUBCUTANEOUS
  Filled 2024-06-16 (×3): qty 1
  Filled 2024-06-16 (×2): qty 2

## 2024-06-16 MED ORDER — FIDAXOMICIN 200 MG PO TABS
200.0000 mg | ORAL_TABLET | Freq: Two times a day (BID) | ORAL | Status: DC
  Administered 2024-06-17 – 2024-06-25 (×16): 200 mg via ORAL
  Filled 2024-06-16 (×18): qty 1

## 2024-06-16 MED ORDER — INSULIN ASPART 100 UNIT/ML IJ SOLN
0.0000 [IU] | Freq: Every day | INTRAMUSCULAR | Status: DC
  Filled 2024-06-16: qty 2

## 2024-06-16 MED ORDER — ACETAMINOPHEN 325 MG PO TABS
650.0000 mg | ORAL_TABLET | Freq: Once | ORAL | Status: AC
  Administered 2024-06-16: 650 mg via ORAL
  Filled 2024-06-16: qty 2

## 2024-06-16 NOTE — ED Notes (Signed)
CBG 110. 

## 2024-06-16 NOTE — ED Notes (Signed)
 Unable to provide second blood culture due to unsuccessful stick and limitate arm space.

## 2024-06-16 NOTE — Plan of Care (Signed)
 C. difficile colitis -C. difficile panel positive with both antigen and toxin. -Given dialysis patient avoiding vancomycin  and starting oral Dificid 200 mg twice daily for 10 days course. -Pending GI panel. -Continue enteric precaution.  Sage Hammill, MD [facility] 06/16/2024, 11:07 PM

## 2024-06-16 NOTE — ED Triage Notes (Signed)
 Complaining of nausea, vomiting, diarrhea, fever, and body aches since Wednesday.

## 2024-06-16 NOTE — ED Notes (Signed)
 PT given orange juice x 2 tolerated well.

## 2024-06-16 NOTE — ED Provider Notes (Signed)
 " [place] EMERGENCY DEPARTMENT AT [facility] Provider Note   CSN: [id number] Arrival date & time: 06/16/24  1203     Patient presents with: Fever   Maria Hudson is a 64 y.o. female with history of ESRD on Monday Tuesday Thursday Friday dialysis from AV fistula (but scheduled to change over to peritoneal dialysis), recent peritoneal dialysis catheter placed on 06/02/2024 with Dr. Sheree, CHF, CAD, type 2 diabetes, presents with concern for ongoing nonbloody diarrhea for about the past week.  Reports multiple episodes of diarrhea per day, unable to quantify how many episodes.  Denies any recent foreign travel or abnormal food intake.  She reports that she had an episode of diarrhea 4 days ago which got all over her and when cleaning up, she got her peritoneal dialysis catheter wet.  3 days ago, she started to develop fever at home and general malaise.  She also reports an episode of syncope when getting up from the toilet 3 days ago.  Reports she landed on her left shoulder and did hit her head.  She is not on any anticoagulation.  She was she missed her session of dialysis today and yesterday.  She denies any cough, sore throat.  Does report some mild abdominal pain.    Fever      Prior to Admission medications  Medication Sig Start Date End Date Taking? Authorizing Provider  acetaminophen  (TYLENOL ) 500 MG tablet Take 1,000 mg by mouth every 6 (six) hours as needed for moderate pain or headache.    [provider]  allopurinol  (ZYLOPRIM ) 100 MG tablet Take 100 mg by mouth in the morning. 01/27/21   [provider]  aspirin  EC 81 MG tablet Take 81 mg by mouth in the morning. Swallow whole.    [provider]  atorvastatin  (LIPITOR) 80 MG tablet Take 1 tablet (80 mg total) by mouth every evening. 01/06/24   Rolan Ezra GORMAN, MD  buPROPion  (WELLBUTRIN ) 100 MG tablet Take 100 mg by mouth 2 (two) times daily. 02/04/21   [provider]  calcitRIOL  (ROCALTROL) 0.5 MCG capsule Take 0.5 mcg by mouth in the morning. Patient not taking: Reported on 05/30/2024    [provider]  carvedilol  (COREG ) 3.125 MG tablet Take 3.125 mg by mouth 2 (two) times daily.    [provider]  Cholecalciferol (D3) 25 MCG (1000 UT) capsule Take 1,000 Units by mouth daily.    [provider]  diphenhydrAMINE (BENADRYL) 25 MG tablet Take 25 mg by mouth every 6 (six) hours as needed for allergies or itching.    [provider]  ezetimibe  (ZETIA ) 10 MG tablet Take 1 tablet (10 mg total) by mouth daily. 03/09/24 06/07/24  Rolan Ezra GORMAN, MD  fluticasone  (FLONASE ) 50 MCG/ACT nasal spray Place 1 spray into both nostrils daily as needed for allergies or rhinitis.    [provider]  isosorbide  mononitrate (IMDUR ) 120 MG 24 hr tablet Take 120 mg by mouth at bedtime.    [provider]  LINZESS  145 MCG CAPS capsule TAKE 1 CAPSULE BY MOUTH DAILY BEFORE BREAKFAST. 05/12/23   Zehr, Jessica D, PA-C  loratadine  (CLARITIN ) 10 MG tablet Take 10 mg by mouth in the morning. 02/14/21   [provider]  Multiple Vitamin (MULTIVITAMIN ADULT) TABS Take 1 tablet by mouth daily with lunch.    [provider]  nitroGLYCERIN  (NITROSTAT ) 0.4 MG SL tablet Place 1 tablet (0.4 mg total) under the tongue every 5 (five) minutes  as needed for chest pain. 03/07/21   [name], Elidia Sieving, MD  NOVOLOG  FLEXPEN 100 UNIT/ML FlexPen Inject 5 Units into the skin 3 (three) times daily as needed for high blood sugar. 01/12/21   [provider]  Omega-3 Fatty Acids (FISH OIL PO) Take 2,400 mg by mouth daily with lunch.    [provider]  OZEMPIC , 2 MG/DOSE, 8 MG/3ML SOPN Inject 2 mg into the skin every 7 (seven) days. 05/15/24   [provider]  pregabalin  (LYRICA ) 75 MG capsule Take 75 mg by mouth 2 (two) times daily. 02/03/21   [provider]  sertraline  (ZOLOFT ) 50 MG tablet Take 50 mg by mouth in the  morning. 02/01/21   [provider]  sucroferric oxyhydroxide (VELPHORO ) 500 MG chewable tablet Chew 500 mg by mouth 3 (three) times daily with meals.    [provider]  torsemide  (DEMADEX ) 20 MG tablet Take 40 mg by mouth See admin instructions. Take 2 tablets (40 mg) by mouth on Wednesdays, Saturdays and Sundays in the morning (non dialysis days)    [provider]  traMADol  (ULTRAM ) 50 MG tablet Take 1 tablet (50 mg total) by mouth every 6 (six) hours as needed for severe pain (pain score 7-10). 06/02/24 06/02/25  Baglia, Corrina, PA-C  traZODone  (DESYREL ) 50 MG tablet Take 50 mg by mouth at bedtime.    [provider]  TURMERIC CURCUMIN PO Take 2,000 mg by mouth daily with lunch.    [provider]    Allergies: Metformin and related, Other, Hydrocodone, and Oxycodone -acetaminophen     Review of Systems  Constitutional:  Positive for fever.    Updated Vital Signs BP (!) 144/52   Pulse 84   Temp 99.4 F (37.4 C) (Oral)   Resp 11   Ht 5' 3 (1.6 m)   Wt 97.5 kg   SpO2 99%   BMI 38.09 kg/m   Physical Exam Vitals and nursing note reviewed.  Constitutional:      General: She is not in acute distress.    Appearance: She is well-developed.  HENT:     Head: Normocephalic and atraumatic.  Eyes:     Conjunctiva/sclera: Conjunctivae normal.  Cardiovascular:     Rate and Rhythm: Normal rate and regular rhythm.     Heart sounds: No murmur heard. Pulmonary:     Effort: Pulmonary effort is normal. No respiratory distress.     Breath sounds: Normal breath sounds.  Abdominal:     Palpations: Abdomen is soft.     Tenderness: There is no abdominal tenderness.     Comments: Well-healing surgical incisions, clean dry and intact with Dermabond still in place  Peritoneal catheter in place with gauze dressing on top.  No drainage from the area or surrounding erythema at the insertion site.  Musculoskeletal:        General: No swelling.      Cervical back: Neck supple.     Comments: Left upper extremity:  General No obvious deformity. No erythema, edema, contusions, open wounds   Palpation Mild tenderness along the scapular ridge and humeral head.  Non-tender to palpation along the clavicle, AC joint, glenohumeral joint.  Nontender over the distal humerus.  Nontender over the cervical, thoracic, or lumbar spine  ROM Full flexion, extension, internal, external rotation  Sensation: Sensation intact throughout the upper extremity    Skin:    General: Skin is warm and dry.     Capillary Refill: Capillary refill takes less than 2  seconds.  Neurological:     Mental Status: She is alert.  Psychiatric:        Mood and Affect: Mood normal.     (all labs ordered are listed, but only abnormal results are displayed) Labs Reviewed  COMPREHENSIVE METABOLIC PANEL WITH GFR - Abnormal; Notable for the following components:      Result Value   Sodium 132 (*)    Potassium 2.8 (*)    Chloride 92 (*)    CO2 18 (*)    Glucose, Bld 100 (*)    BUN 107 (*)    Creatinine, Ser 11.70 (*)    Calcium  8.7 (*)    GFR, Estimated 3 (*)    Anion gap 22 (*)    All other components within normal limits  CBC - Abnormal; Notable for the following components:   WBC 13.5 (*)    Hemoglobin 11.4 (*)    HCT 33.4 (*)    MCV 79.5 (*)    RDW 17.2 (*)    All other components within normal limits  RESP PANEL BY RT-PCR (RSV, FLU A&B, COVID)  RVPGX2  RESPIRATORY PANEL BY PCR  CULTURE, BLOOD (ROUTINE X 2)  CULTURE, BLOOD (ROUTINE X 2)  GASTROINTESTINAL PANEL BY PCR, STOOL (REPLACES STOOL CULTURE)  C DIFFICILE QUICK SCREEN W PCR REFLEX    LIPASE, BLOOD  LACTIC ACID, PLASMA  LACTIC ACID, PLASMA  MAGNESIUM  URINALYSIS, ROUTINE W REFLEX MICROSCOPIC  BASIC METABOLIC PANEL WITH GFR  HEMOGLOBIN A1C  BASIC METABOLIC PANEL WITH GFR  CBC    EKG: EKG Interpretation Date/Time:  Friday June 16 2024 13:15:53 EST Ventricular Rate:  92 PR  Interval:  180 QRS Duration:  102 QT Interval:  360 QTC Calculation: 445 R Axis:   -15  Text Interpretation: Normal sinus rhythm Minimal voltage criteria for LVH, may be normal variant ( Cornell product ) Nonspecific ST abnormality Abnormal ECG When compared with ECG of 05-Jan-2024 08:52, Criteria for Septal infarct are no longer Present Confirmed by Levander Houston [phone]) on 06/16/2024 1:41:19 PM  Radiology: CT ABDOMEN PELVIS W CONTRAST Result Date: 06/16/2024 EXAM: CT ABDOMEN AND PELVIS WITH CONTRAST 06/16/2024 07:29:54 PM TECHNIQUE: CT of the abdomen and pelvis was performed with the administration of 100 mL of iohexol (OMNIPAQUE) 300 MG/ML solution. Multiplanar reformatted images are provided for review. Automated exposure control, iterative reconstruction, and/or weight-based adjustment of the mA/kV was utilized to reduce the radiation dose to as low as reasonably achievable. COMPARISON: 5125. CLINICAL HISTORY: Eval possible intra-abdominal infection from new peritoneal dialysis catheter. Patient with unexplained fever. FINDINGS: LOWER CHEST: Calcifications in the visualized right coronary artery. LIVER: The liver is unremarkable. GALLBLADDER AND BILE DUCTS: Small layering stones within the gallbladder. No biliary ductal dilatation. SPLEEN: No acute abnormality. PANCREAS: No acute abnormality. ADRENAL GLANDS: 1.2 cm left adrenal nodule is stable since the prior study and likely reflects adenoma. KIDNEYS, URETERS AND BLADDER: No stones in the kidneys or ureters. No hydronephrosis. No perinephric or periureteral stranding. Urinary bladder is unremarkable. GI AND BOWEL: Stomach demonstrates no acute abnormality. There appears to be mild wall thickening involving the transverse colon, descending colon and possibly the sigmoid colon suggesting colitis. Recommend clinical correlation. No bowel obstruction. PERITONEUM AND RETROPERITONEUM: Peritoneal dialysis catheter terminates in the pelvis. Small amount of  free fluid. No free air. VASCULATURE: Aorta is normal in caliber. Aortic atherosclerosis. Aortic catheter sclerosis. LYMPH NODES: No lymphadenopathy. REPRODUCTIVE ORGANS: Prior hysterectomy. No adnexal mass. BONES AND SOFT TISSUES: No acute osseous abnormality.  No focal soft tissue abnormality. IMPRESSION: 1. Question mild wall thickening involving the transverse colon, descending colon, and possibly the sigmoid colon, suggestive of colitis. 2. Coronary artery disease, aortic atherosclerosis. 3. Cholelithiasis. Electronically signed by: Franky Crease MD 06/16/2024 07:36 PM EST RP Workstation: [id number]   CT Head Wo Contrast Result Date: 06/16/2024 EXAM: CT HEAD WITHOUT 06/16/2024 07:29:54 PM TECHNIQUE: CT of the head was performed without the administration of intravenous contrast. Automated exposure control, iterative reconstruction, and/or weight based adjustment of the mA/kV was utilized to reduce the radiation dose to as low as reasonably achievable. COMPARISON: 10/12/2023 CLINICAL HISTORY: Head trauma, moderate-severe; Syncopal episode and hit head. FINDINGS: BRAIN AND VENTRICLES: No acute intracranial hemorrhage. No mass effect or midline shift. No extra-axial fluid collection. No evidence of acute infarct. No hydrocephalus. ORBITS: No acute abnormality. SINUSES AND MASTOIDS: No acute abnormality. SOFT TISSUES AND SKULL: No acute skull fracture. No acute soft tissue abnormality. IMPRESSION: 1. No acute intracranial abnormality. Electronically signed by: Franky Stanford MD 06/16/2024 07:34 PM EST RP Workstation: [id number]   DG Shoulder Left Result Date: 06/16/2024 EXAM: 1 VIEW(S) XRAY OF THE LEFT SHOULDER 06/16/2024 06:57:13 PM COMPARISON: 02/03/2023 CLINICAL HISTORY: left shoulder pain after fall FINDINGS: BONES AND JOINTS: The glenohumeral joint is maintained. No fracture, subluxation, or dislocation. Degenerative changes in the [facility] joint with joint space narrowing and spurring. Spurring at the rotator cuff  insertion at the greater tuberosity. SOFT TISSUES: No abnormal calcifications. Visualized lung is unremarkable. IMPRESSION: 1. No acute fracture, subluxation, or dislocation. Electronically signed by: Franky Crease MD 06/16/2024 07:32 PM EST RP Workstation: [id number]   DG Chest 2 View Result Date: 06/16/2024 CLINICAL DATA:  Fever. EXAM: CHEST - 2 VIEW COMPARISON:  12/18/2023 FINDINGS: Improved cardiomegaly from prior exam. Stable mediastinal contours. Slight central vascular congestion. No pleural effusion or pneumothorax. No focal airspace disease. No acute osseous findings. IMPRESSION: Improved cardiomegaly from prior exam. Slight central vascular congestion. Electronically Signed   By: Andrea Gasman M.D.   On: 06/16/2024 16:12     Procedures   Medications Ordered in the ED  ciprofloxacin (CIPRO) IVPB 400 mg (has no administration in time range)  cefTRIAXone  (ROCEPHIN ) 2 g in sodium chloride  0.9 % 100 mL IVPB (has no administration in time range)  metroNIDAZOLE (FLAGYL) IVPB 500 mg (has no administration in time range)  insulin  aspart (novoLOG ) injection 0-6 Units (has no administration in time range)  insulin  aspart (novoLOG ) injection 0-5 Units (has no administration in time range)  acetaminophen  (TYLENOL ) tablet 650 mg (650 mg Oral Given 06/16/24 1314)  potassium chloride  SA (KLOR-CON  M) CR tablet 40 mEq (40 mEq Oral Given 06/16/24 1820)  iohexol (OMNIPAQUE) 300 MG/ML solution 100 mL (100 mLs Intravenous Contrast Given 06/16/24 1928)    Clinical Course as of 06/16/24 2149  Fri Jun 16, 2024  1753 Recently had peritoneal dialysis catheter placed on 06/02/24. Was previously dialyzing via left arm AV fistula  [AF]  1807 Dr. Patsey recommending CT abdomen and pelvis with contrast  [AF]  2006 Consulted with Dr. Norine with nephrology, she request that I secure chat for the patient's name and she can see patient when she gets to [facility] on a non-emergent basis in the morning [AF]  2027 Consulted  with Dr. Sundil, recommends admission [AF]    Clinical Course User Index [AF] Veta Palma, PA-C  Medical Decision Making Amount and/or Complexity of Data Reviewed Labs: ordered. Radiology: ordered.  Risk OTC drugs. Prescription drug management. Decision regarding hospitalization.     Differential diagnosis includes but is not limited to intra-abdominal infection associated with the recent peritoneal dialysis catheter placement, sepsis, COVID, flu, RSV, pneumonia, gastroenteritis, enteritis, diverticulitis  ED Course:  Upon initial evaluation, patient is sick appearing, but no acute distress.  Appears very tired.  Her, she holds conversation appropriately.  Initially febrile to 101.1 upon arrival, was given Tylenol  and this improved to 99.4.  Abdomen without significant tenderness on my exam, the patient does report some intermittent abdominal pain at home. She did report a syncopal episode that occurred when getting off the toilet 3 days ago.  She did hit her head and hit her left shoulder.  She is tender over the scapular ridge and humeral head of the left shoulder.  Will obtain x-ray of the left shoulder and CT head.  Nontender over the cervical, thoracic, or lumbar spine.  Labs Ordered: I Ordered, and personally interpreted labs.  The pertinent results include:   CBC with leukocytosis of 13.5.  Hemoglobin 11.4, at baseline CMP with hyponatremia 132.  Hypokalemia 2.8.  Creatinine elevated at 11., up from 6.8 taken 2 weeks ago. GFR of 3 today Lipase within normal limits COVID, flu, RSV negative Lactic acid within normal limits Blood cultures pending Stool testing pending  Imaging Studies ordered: I ordered imaging studies including CT abdomen pelvis, CT head, x-ray left shoulder, chest x-ray I independently visualized the imaging with scope of interpretation limited to determining acute life threatening conditions related to emergency care.  Imaging showed  CT abdomen pelvis: IMPRESSION:  1. Question mild wall thickening involving the transverse colon, descending  colon, and possibly the sigmoid colon, suggestive of colitis.  2. Coronary artery disease, aortic atherosclerosis.  3. Cholelithiasis.   CT head and x-ray of the left shoulder without acute abnormality  Chest x-ray: IMPRESSION:  Improved cardiomegaly from prior exam. Slight central vascular  congestion.   I agree with the radiologist interpretation   Consultations Obtained: I requested consultation with the nephrology Dr. Norine,  and discussed lab and imaging findings as well as pertinent plan - they recommend: She will see patient in the morning in consult I requested consultation with the hospitalist Dr. Sundil,  and discussed lab and imaging findings as well as pertinent plan - they recommend: admission  Medications Given: Tylenol  Metronidazole Ciprofloxacin 40 mEq KCl tablet  Upon re-evaluation, patient remains well-appearing with stable vitals.  Has remained afebrile with the use of Tylenol .  No tachycardia or hypotension, lactic acid within normal limits, no concern for sepsis at this time.   CT scan shows concern for possible colitis, and this would fit with patient's clinical picture with ongoing diarrhea for the past week.  Intra-abdominal infection likely source of patient's fever today.  Will cover with metronidazole and ciprofloxacin.  Stool testing is in process. Do not see any other source of infection today.  Chest x-ray without signs of pneumonia.  COVID, flu, RSV testing negative.  Patient without any URI type symptoms.  Awaiting urinalysis, but patient denying any urinary symptoms. Patient has an increase in her creatinine at 11.7 up from 6.8 taken 2 weeks ago.  She reports missing her past 2 sessions of dialysis.  No hyperkalemia or signs of fluid overload to warrant emergent dialysis at this time.  Nephrology was consulted and they will see  patient in consult in the morning.  Impression: Fever, likely from intra-abdominal source of infection  Disposition:  Admission with hospitalist Dr. Sundil, nephrology to consult  Record Review: External records from outside source obtained and reviewed including op note from 06/02/2024 with Dr. Sheree where patient had peritoneal dialysis catheter placed     This chart was dictated using voice recognition software, Dragon. Despite the best efforts of this provider to proofread and correct errors, errors may still occur which can change documentation meaning.        Final diagnoses:  Fever, unspecified fever cause  AKI (acute kidney injury)  Colitis    ED Discharge Orders     None          Veta Palma, DEVONNA 06/16/24 2149    Patsey Lot, MD 06/16/24 2350  "

## 2024-06-16 NOTE — Plan of Care (Addendum)
 [facility] transfer telemetry med transfer:  64 year old female ESRD on on dialysis 4 times weekly Monday, Tuesday, Thursday, Friday, recent peritoneal dialysis catheter placed on 12/19, chronic diastolic heart failure, CAD, CAD, and DM type II presented emergency department complaining of nausea, vomiting, diarrhea, fever and bodyaches since Wednesday.She reports that she had an episode of diarrhea 4 days ago which got all over her and when cleaning up, she got her peritoneal dialysis catheter wet.  3 days ago, she started to develop fever at home and general malaise.  She also reports an episode of syncope when getting up from the toilet 3 days ago.  Reports she landed on her left shoulder and did hit her head.  She is not on any anticoagulation.  She was she missed her session of dialysis today and yesterday.  She denies any cough, sore throat.  Does report some mild abdominal pain.    At presentation to ED patient found febrile otherwise hemodynamically stable. Lab work, CBC showing leukocytosis, stable H&H, platelet count. CMP showing low sodium 132, low potassium 2.8, evidence of ESRD.  Normal hepatic function level.  Normal lipase level.  Respiratory panel negative for COVID RSV flu.  Blood cultures are in process.  Normal lactic acid level.  Pending GI and C. difficile panel.  Imaging, CT head unremarkable. CT abdomen pelvis concern for colitis. IMPRESSION: 1. Question mild wall thickening involving the transverse colon, descending colon, and possibly the sigmoid colon, suggestive of colitis. 2. Coronary artery disease, aortic atherosclerosis. 3. Cholelithiasis.  Chest x-ray showing cardiomegaly.  Pulmonary vascular congestion. X-ray left shoulder no fracture or dislocation.  In the ED patient received ciprofloxacin and metronidazole and oral KCl.  EDP consulted nephrology Dr. Norine will evaluate patient tomorrow for dialysis.   Hospitalist consulted for further  evaluation management of colitis, hypokalemia and syncopal episode-likely vasovagal syncope in the setting of diarrhea. Updated 11:30 PM, C. difficile positive.  Starting oral fidaxomicin.  Discontinued IV ceftriaxone  and metronidazole.   [facility] will assume care on arrival to accepting facility. Until arrival, care as per EDP. However, [facility] available 24/7 for questions and assistance. Check [email or web address] for on-call coverage. Nursing staff, please call [facility] Admits & Consults System-Wide number under Amion on patient's arrival so appropriate admitting provider can evaluate the pt.   Author: Shaunak Kreis, MD  [facility]

## 2024-06-16 NOTE — ED Notes (Signed)
 Patient notified of need for urine sample to complete eval/assessment. Specimen cup provided and instructions for clean catch given. Patient will notify staff when able to provide

## 2024-06-16 NOTE — Progress Notes (Signed)
 PHARMACY NOTE:  ANTIMICROBIAL RENAL DOSAGE ADJUSTMENT  Current antimicrobial regimen includes a mismatch between antimicrobial dosage and estimated renal function.  As per policy approved by the Pharmacy & Therapeutics and Medical Executive Committees, the antimicrobial dosage will be adjusted accordingly.  Current antimicrobial dosage:  cipro 400mg  q12h   Indication: intra-abdominal infection   Renal Function:  Estimated Creatinine Clearance: 5.5 mL/min (A) (by C-G formula based on SCr of 11.7 mg/dL (H)). [x]      On intermittent HD []      On CRRT    Antimicrobial dosage has been changed to:  cipro 400mg  q24h   Additional comments:   Thank you for allowing pharmacy to be a part of this patient's care.  Powell Blush, PharmD, BCCCP  06/16/2024 8:16 PM

## 2024-06-17 ENCOUNTER — Encounter (HOSPITAL_COMMUNITY): Payer: Self-pay | Admitting: Internal Medicine

## 2024-06-17 DIAGNOSIS — N1832 Chronic kidney disease, stage 3b: Secondary | ICD-10-CM

## 2024-06-17 DIAGNOSIS — A0472 Enterocolitis due to Clostridium difficile, not specified as recurrent: Secondary | ICD-10-CM | POA: Diagnosis present

## 2024-06-17 DIAGNOSIS — E7800 Pure hypercholesterolemia, unspecified: Secondary | ICD-10-CM | POA: Diagnosis present

## 2024-06-17 DIAGNOSIS — N2581 Secondary hyperparathyroidism of renal origin: Secondary | ICD-10-CM | POA: Diagnosis present

## 2024-06-17 DIAGNOSIS — F39 Unspecified mood [affective] disorder: Secondary | ICD-10-CM | POA: Diagnosis present

## 2024-06-17 DIAGNOSIS — Z992 Dependence on renal dialysis: Secondary | ICD-10-CM | POA: Diagnosis not present

## 2024-06-17 DIAGNOSIS — I2510 Atherosclerotic heart disease of native coronary artery without angina pectoris: Secondary | ICD-10-CM | POA: Diagnosis present

## 2024-06-17 DIAGNOSIS — E1122 Type 2 diabetes mellitus with diabetic chronic kidney disease: Secondary | ICD-10-CM | POA: Diagnosis present

## 2024-06-17 DIAGNOSIS — Z794 Long term (current) use of insulin: Secondary | ICD-10-CM | POA: Diagnosis not present

## 2024-06-17 DIAGNOSIS — E876 Hypokalemia: Secondary | ICD-10-CM | POA: Diagnosis present

## 2024-06-17 DIAGNOSIS — I5032 Chronic diastolic (congestive) heart failure: Secondary | ICD-10-CM | POA: Diagnosis present

## 2024-06-17 DIAGNOSIS — N186 End stage renal disease: Secondary | ICD-10-CM | POA: Diagnosis present

## 2024-06-17 DIAGNOSIS — E871 Hypo-osmolality and hyponatremia: Secondary | ICD-10-CM | POA: Diagnosis present

## 2024-06-17 DIAGNOSIS — E66812 Obesity, class 2: Secondary | ICD-10-CM

## 2024-06-17 DIAGNOSIS — I10 Essential (primary) hypertension: Secondary | ICD-10-CM

## 2024-06-17 DIAGNOSIS — Z1152 Encounter for screening for COVID-19: Secondary | ICD-10-CM | POA: Diagnosis not present

## 2024-06-17 DIAGNOSIS — E1142 Type 2 diabetes mellitus with diabetic polyneuropathy: Secondary | ICD-10-CM | POA: Diagnosis not present

## 2024-06-17 DIAGNOSIS — E1140 Type 2 diabetes mellitus with diabetic neuropathy, unspecified: Secondary | ICD-10-CM | POA: Diagnosis present

## 2024-06-17 DIAGNOSIS — E860 Dehydration: Secondary | ICD-10-CM | POA: Diagnosis present

## 2024-06-17 DIAGNOSIS — K581 Irritable bowel syndrome with constipation: Secondary | ICD-10-CM | POA: Diagnosis present

## 2024-06-17 DIAGNOSIS — N179 Acute kidney failure, unspecified: Secondary | ICD-10-CM | POA: Diagnosis present

## 2024-06-17 DIAGNOSIS — G4733 Obstructive sleep apnea (adult) (pediatric): Secondary | ICD-10-CM | POA: Diagnosis present

## 2024-06-17 DIAGNOSIS — K589 Irritable bowel syndrome without diarrhea: Secondary | ICD-10-CM | POA: Diagnosis not present

## 2024-06-17 DIAGNOSIS — I132 Hypertensive heart and chronic kidney disease with heart failure and with stage 5 chronic kidney disease, or end stage renal disease: Secondary | ICD-10-CM | POA: Diagnosis present

## 2024-06-17 DIAGNOSIS — D631 Anemia in chronic kidney disease: Secondary | ICD-10-CM | POA: Diagnosis present

## 2024-06-17 DIAGNOSIS — R509 Fever, unspecified: Secondary | ICD-10-CM | POA: Diagnosis present

## 2024-06-17 DIAGNOSIS — E861 Hypovolemia: Secondary | ICD-10-CM | POA: Diagnosis present

## 2024-06-17 DIAGNOSIS — Z8249 Family history of ischemic heart disease and other diseases of the circulatory system: Secondary | ICD-10-CM | POA: Diagnosis not present

## 2024-06-17 DIAGNOSIS — R55 Syncope and collapse: Secondary | ICD-10-CM | POA: Diagnosis not present

## 2024-06-17 DIAGNOSIS — I951 Orthostatic hypotension: Secondary | ICD-10-CM | POA: Diagnosis present

## 2024-06-17 HISTORY — DX: Enterocolitis due to Clostridium difficile, not specified as recurrent: A04.72

## 2024-06-17 LAB — HEMOGLOBIN A1C
Hgb A1c MFr Bld: 6.3 % — ABNORMAL HIGH (ref 4.8–5.6)
Mean Plasma Glucose: 134.11 mg/dL

## 2024-06-17 LAB — BASIC METABOLIC PANEL WITH GFR
Anion gap: 22 — ABNORMAL HIGH (ref 5–15)
Anion gap: 21 — ABNORMAL HIGH (ref 5–15)
BUN: 109 mg/dL — ABNORMAL HIGH (ref 8–23)
BUN: 106 mg/dL — ABNORMAL HIGH (ref 8–23)
CO2: 14 mmol/L — ABNORMAL LOW (ref 22–32)
CO2: 20 mmol/L — ABNORMAL LOW (ref 22–32)
Calcium: 8.4 mg/dL — ABNORMAL LOW (ref 8.9–10.3)
Calcium: 8.4 mg/dL — ABNORMAL LOW (ref 8.9–10.3)
Chloride: 93 mmol/L — ABNORMAL LOW (ref 98–111)
Chloride: 93 mmol/L — ABNORMAL LOW (ref 98–111)
Creatinine, Ser: 12.1 mg/dL — ABNORMAL HIGH (ref 0.44–1.00)
Creatinine, Ser: 11.9 mg/dL — ABNORMAL HIGH (ref 0.44–1.00)
GFR, Estimated: 3 mL/min — ABNORMAL LOW
GFR, Estimated: 3 mL/min — ABNORMAL LOW
Glucose, Bld: 151 mg/dL — ABNORMAL HIGH (ref 70–99)
Glucose, Bld: 130 mg/dL — ABNORMAL HIGH (ref 70–99)
Potassium: 3.8 mmol/L (ref 3.5–5.1)
Potassium: 3 mmol/L — ABNORMAL LOW (ref 3.5–5.1)
Sodium: 129 mmol/L — ABNORMAL LOW (ref 135–145)
Sodium: 134 mmol/L — ABNORMAL LOW (ref 135–145)

## 2024-06-17 LAB — GASTROINTESTINAL PANEL BY PCR, STOOL (REPLACES STOOL CULTURE)

## 2024-06-17 LAB — CBG MONITORING, ED
Glucose-Capillary: 121 mg/dL — ABNORMAL HIGH (ref 70–99)
Glucose-Capillary: 103 mg/dL — ABNORMAL HIGH (ref 70–99)

## 2024-06-17 LAB — CBC
HCT: 36.2 % (ref 36.0–46.0)
Hemoglobin: 12.2 g/dL (ref 12.0–15.0)
MCH: 26.8 pg (ref 26.0–34.0)
MCHC: 33.7 g/dL (ref 30.0–36.0)
MCV: 79.4 fL — ABNORMAL LOW (ref 80.0–100.0)
Platelets: 226 K/uL (ref 150–400)
RBC: 4.56 MIL/uL (ref 3.87–5.11)
RDW: 17.2 % — ABNORMAL HIGH (ref 11.5–15.5)
WBC: 13.2 K/uL — ABNORMAL HIGH (ref 4.0–10.5)
nRBC: 0 % (ref 0.0–0.2)

## 2024-06-17 LAB — GLUCOSE, CAPILLARY
Glucose-Capillary: 86 mg/dL (ref 70–99)
Glucose-Capillary: 142 mg/dL — ABNORMAL HIGH (ref 70–99)

## 2024-06-17 LAB — HEPATITIS B SURFACE ANTIGEN: Hepatitis B Surface Ag: NONREACTIVE

## 2024-06-17 MED ORDER — CHLORHEXIDINE GLUCONATE CLOTH 2 % EX PADS
6.0000 | MEDICATED_PAD | Freq: Every day | CUTANEOUS | Status: DC
  Administered 2024-06-18 – 2024-06-21 (×4): 6 via TOPICAL

## 2024-06-17 MED ORDER — TRAMADOL HCL 50 MG PO TABS
50.0000 mg | ORAL_TABLET | Freq: Four times a day (QID) | ORAL | Status: DC | PRN
  Administered 2024-06-18 – 2024-06-19 (×2): 50 mg via ORAL
  Filled 2024-06-17 (×2): qty 1

## 2024-06-17 MED ORDER — HEPARIN SODIUM (PORCINE) 5000 UNIT/ML IJ SOLN
5000.0000 [IU] | Freq: Three times a day (TID) | INTRAMUSCULAR | Status: DC
  Administered 2024-06-17 – 2024-06-25 (×22): 5000 [IU] via SUBCUTANEOUS
  Filled 2024-06-17 (×23): qty 1

## 2024-06-17 MED ORDER — POTASSIUM CHLORIDE CRYS ER 20 MEQ PO TBCR
20.0000 meq | EXTENDED_RELEASE_TABLET | Freq: Once | ORAL | Status: AC
  Administered 2024-06-17: 20 meq via ORAL
  Filled 2024-06-17: qty 1

## 2024-06-17 MED ORDER — CARVEDILOL 3.125 MG PO TABS
3.1250 mg | ORAL_TABLET | Freq: Two times a day (BID) | ORAL | Status: DC
  Administered 2024-06-17 – 2024-06-21 (×4): 3.125 mg via ORAL
  Filled 2024-06-17 (×8): qty 1

## 2024-06-17 MED ORDER — ACETAMINOPHEN 500 MG PO TABS
1000.0000 mg | ORAL_TABLET | Freq: Four times a day (QID) | ORAL | Status: DC | PRN
  Administered 2024-06-18 – 2024-06-19 (×2): 1000 mg via ORAL
  Filled 2024-06-17 (×5): qty 2

## 2024-06-17 MED ORDER — SACCHAROMYCES BOULARDII 250 MG PO CAPS
250.0000 mg | ORAL_CAPSULE | Freq: Two times a day (BID) | ORAL | Status: DC
  Administered 2024-06-17 – 2024-06-22 (×10): 250 mg via ORAL
  Filled 2024-06-17 (×12): qty 1

## 2024-06-17 MED ORDER — ONDANSETRON HCL 4 MG PO TABS: 4.0000 mg | ORAL_TABLET | Freq: Four times a day (QID) | ORAL | Status: DC | PRN

## 2024-06-17 MED ORDER — ACETAMINOPHEN 325 MG PO TABS
650.0000 mg | ORAL_TABLET | Freq: Four times a day (QID) | ORAL | Status: DC | PRN
  Administered 2024-06-17: 650 mg via ORAL
  Filled 2024-06-17: qty 2

## 2024-06-17 MED ORDER — BUPROPION HCL 100 MG PO TABS
100.0000 mg | ORAL_TABLET | Freq: Two times a day (BID) | ORAL | Status: DC
  Administered 2024-06-17 – 2024-06-25 (×15): 100 mg via ORAL
  Filled 2024-06-17 (×17): qty 1

## 2024-06-17 MED ORDER — PREGABALIN 25 MG PO CAPS
25.0000 mg | ORAL_CAPSULE | Freq: Every day | ORAL | Status: DC
  Administered 2024-06-17 – 2024-06-25 (×9): 25 mg via ORAL
  Filled 2024-06-17 (×9): qty 1

## 2024-06-17 MED ORDER — SODIUM BICARBONATE 8.4 % IV SOLN
50.0000 meq | INTRAVENOUS | Status: AC
  Administered 2024-06-17: 50 meq via INTRAVENOUS

## 2024-06-17 MED ORDER — ISOSORBIDE MONONITRATE ER 60 MG PO TB24
120.0000 mg | ORAL_TABLET | Freq: Every day | ORAL | Status: DC
  Administered 2024-06-17 – 2024-06-18 (×2): 120 mg via ORAL
  Filled 2024-06-17 (×4): qty 2

## 2024-06-17 MED ORDER — ONDANSETRON HCL 4 MG/2ML IJ SOLN: 4.0000 mg | Freq: Four times a day (QID) | INTRAMUSCULAR | Status: DC | PRN

## 2024-06-17 NOTE — Assessment & Plan Note (Addendum)
 06/17/2024 continue with Lyrica  BID and as needed Ultram .

## 2024-06-17 NOTE — ED Notes (Signed)
 PT requested to sit at bedside chair. Pt states he back has been hurting from laying on stretcher for past several hours. Non slip socks placed on PT and call light adjusted to be in reach. Pt remains a/o x 4 skin warm dry, VSS NAD

## 2024-06-17 NOTE — Hospital Course (Signed)
 CC: N/V, diarrhea, fever for 3 days HPI: 64 year old African-American female with a history of end-stage renal disease on home hemodialysis on Monday, Tuesday, Thursday and Friday.  Patient only able to tolerate 3-hour dialysis sessions due to feeling bad.  She has also a history of type 2 diabetes, hypertension.  She recently had a peritoneal dialysis catheter placed by Dr. Sheree with vascular surgery on June 02, 2024.  Patient is attempting to transition to peritoneal dialysis.  She follows with Dr. Katheryn Messier with [facility].  A last 3 days, she has had bodyaches, nausea, vomiting and diarrhea.  She has had upwards of 8 watery stools per day.  She reported a episode of syncope when her diarrhea started when she was trying to get up from the toilet.  She came to the ER for evaluation.  She has complaints of rectal tenesmus after having a watery stool.  On arrival temp 101.1 heart rate 90 blood pressure 133/58 satting 96% on room air.  White count 13.5, hemoglobin 1.4, platelets of 239  Sodium 132, potassium 2.8, CO2 of 18, chloride of 92, BUN of 107, creatinine 11.7, glucose of 100  Total protein 7.2, albumin 3.9, AST 22, ALT of less than 5, alk phos of 54, total bili 0.3  Lipase at 30  COVID-negative, influenza negative, RSV negative  Lactic acid 0.7  Magnesium 2.1  She had 1 low blood sugar of 47  C. difficile toxin and antigen were both positive  RVP was negative  A1c of 6.3% indicating good outpatient control  CT abdomen pelvis demonstrated colitis of the transverse colon and descending colon.  There is cholelithiasis without evidence of acute cholecystitis.  CT head showed no acute intracranial abnormality.  Left shoulder x-rays were negative for acute fracture.  Chest x-ray showed improved cardiomegaly.  Significant Events: Admitted 06/16/2024 for c. Diff diarrhea/colitis   Admission Labs: White count 13.5, hemoglobin 1.4, platelets of 239 Sodium 132,  potassium 2.8, CO2 of 18, chloride of 92, BUN of 107, creatinine 11.7, glucose of 100 Total protein 7.2, albumin 3.9, AST 22, ALT of less than 5, alk phos of 54, total bili 0.3 Lipase at 30 COVID-negative, influenza negative, RSV negative Lactic acid 0.7 Magnesium 2.1 She had 1 low blood sugar of 47 C. difficile toxin and antigen were both positive  Admission Imaging Studies: CT abdomen pelvis demonstrated colitis of the transverse colon and descending colon.  There is cholelithiasis without evidence of acute cholecystitis. CT head showed no acute intracranial abnormality. Left shoulder x-rays were negative for acute fracture. Chest x-ray showed improved cardiomegaly.  Significant Labs:   Significant Imaging Studies:   Antibiotic Therapy: Anti-infectives (From admission, onward)    Start     Dose/Rate Route Frequency Ordered Stop   06/17/24 2015  ciprofloxacin  (CIPRO ) IVPB 400 mg  Status:  Discontinued        400 mg 200 mL/hr over 60 Minutes Intravenous Every 24 hours 06/16/24 2015 06/16/24 2334   06/17/24 0800  metroNIDAZOLE  (FLAGYL ) IVPB 500 mg  Status:  Discontinued        500 mg 100 mL/hr over 60 Minutes Intravenous Every 12 hours 06/16/24 2027 06/16/24 2334   06/17/24 0200  fidaxomicin  (DIFICID ) tablet 200 mg        200 mg Oral 2 times daily 06/16/24 2305 06/26/24 2159   06/17/24 0000  cefTRIAXone  (ROCEPHIN ) 2 g in sodium chloride  0.9 % 100 mL IVPB  Status:  Discontinued        2  g 200 mL/hr over 30 Minutes Intravenous Every 24 hours 06/16/24 2027 06/16/24 2334   06/17/24 0000  metroNIDAZOLE  (FLAGYL ) IVPB 500 mg  Status:  Discontinued        500 mg 100 mL/hr over 60 Minutes Intravenous Every 12 hours 06/16/24 2027 06/16/24 2027   06/16/24 2015  ciprofloxacin  (CIPRO ) IVPB 400 mg  Status:  Discontinued        400 mg 200 mL/hr over 60 Minutes Intravenous Every 12 hours 06/16/24 2000 06/16/24 2015   06/16/24 2015  metroNIDAZOLE  (FLAGYL ) IVPB 500 mg  Status:  Discontinued         500 mg 100 mL/hr over 60 Minutes Intravenous Every 12 hours 06/16/24 2000 06/16/24 2051       Procedures:   Consultants: Nephrology  - Geralynn

## 2024-06-17 NOTE — Consult Note (Signed)
 [facility] Renal Consultation Note    Indication for Consultation:  Management of ESRD/hemodialysis, anemia, hypertension/volume, and secondary hyperparathyroidism. PCP:  HPI: Maria Hudson is a 64 y.o. female with ESRD on home HD, HTN, T2DM, Hx CVA, CAD, and recent PD cath placement who was admitted with C.difficle colitis.  Presented to [facility] ED on 1/2 with 4 days of N/V/D, fever, and body aches. Many episodes of non-bloody diarrhea. No sicks contacts. In ED, she was febrile to 101.36F, BP ok. Labs with Na 132, K 2.8, CO2 18, BUN 107, Cr 11.7, Ca 8.7, WBC `3.5, Hgb 11.4, Plts 239. Flu/COVID negative. C.Diff positive. CT abdomen with presumed colitis of transverse and descending colon. Head CT negative. CXR with mild vasc congestion. Initially started on Cipro /Flagyl  prior to the C.diff resulting, now those have been stopped and started on fidaxomicin . S/p 40mEq K supplementation.  Seen in room. Generalized abd pain, but hanging in there. No CP/dyspnea. No LE edema.  Last dialyzed 12/30, uses LUE AVF with buttonholes. S/p PD placement 12/19, s/p 2 flushes without incident.   Past Medical History:  Diagnosis Date   Acute on chronic diastolic heart failure (HCC) 03/05/2021   Anemia    Arthritis    CAD (coronary artery disease)    CHF (congestive heart failure) (HCC)    Colon polyps    CVA (cerebral vascular accident) (HCC)    Depression    Diabetes mellitus without complication (HCC)    type 2   Difficult intubation    Dyspnea    ESRD on hemodialysis (HCC)    Heart murmur    Hypertensive urgency 04/14/2021   Memory loss    mild   Myocardial infarction (HCC)    OSA (obstructive sleep apnea) 05/27/2021   uses CPAP 12-18 cm   Pneumonia    several times   Pure hypercholesterolemia 05/27/2021   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   Stroke [facility])    Past Surgical History:  Procedure Laterality Date   A/V FISTULAGRAM Left 08/09/2023   Procedure: A/V  Fistulagram;  Surgeon: Sheree Penne Bruckner, MD;  Location: [facility] INVASIVE CV LAB;  Service: Cardiovascular;  Laterality: Left;   A/V FISTULAGRAM Left 03/15/2024   Procedure: A/V Fistulagram;  Surgeon: Pearline Norman GORMAN, MD;  Location: [facility] PV LAB;  Service: Cardiovascular;  Laterality: Left;   A/V SHUNT INTERVENTION N/A 12/15/2023   Procedure: A/V SHUNT INTERVENTION;  Surgeon: Pearline Norman GORMAN, MD;  Location: [facility] PV LAB;  Service: Cardiovascular;  Laterality: N/A;   A/V SHUNT INTERVENTION Left 01/21/2024   Procedure: A/V SHUNT INTERVENTION;  Surgeon: Serene Gaile ORN, MD;  Location: [facility] PV LAB;  Service: Cardiovascular;  Laterality: Left;   ABDOMINAL HYSTERECTOMY     AV FISTULA PLACEMENT Left 12/09/2021   Procedure: LEFT ARTERIOVENOUS (AV) FISTULA CREATION;  Surgeon: Sheree Penne Bruckner, MD;  Location: [facility] OR;  Service: Vascular;  Laterality: Left;   BIOPSY  09/08/2021   Procedure: BIOPSY;  Surgeon: Legrand Victory LITTIE DOUGLAS, MD;  Location: [facility];  Service: Gastroenterology;;   CESAREAN SECTION     x 1   ESOPHAGOGASTRODUODENOSCOPY (EGD) WITH PROPOFOL  N/A 09/08/2021   Procedure: ESOPHAGOGASTRODUODENOSCOPY (EGD) WITH PROPOFOL ;  Surgeon: Legrand Victory LITTIE DOUGLAS, MD;  Location: [facility];  Service: Gastroenterology;  Laterality: N/A;  chest pain, epigastric pain   FISTULA SUPERFICIALIZATION Left 08/11/2023   Procedure: FISTULA SUPERFICIALIZATION LEFT UPPER ARM;  Surgeon: Magda Debby SAILOR, MD;  Location: [facility] OR;  Service: Vascular;  Laterality: Left;   INNER EAR SURGERY Bilateral  INSERTION OF DIALYSIS CATHETER Right 08/11/2023   Procedure: INSERTION OF DIALYSIS CATHETER;  Surgeon: Magda Debby SAILOR, MD;  Location: [facility] OR;  Service: Vascular;  Laterality: Right;   LAPAROSCOPIC LYSIS OF ADHESIONS N/A 06/02/2024   Procedure: LYSIS, ADHESIONS, LAPAROSCOPIC;  Surgeon: Sheree Penne Bruckner, MD;  Location: [facility] OR;  Service: Vascular;  Laterality: N/A;   RIGHT HEART CATH N/A 05/14/2021   Procedure: RIGHT  HEART CATH;  Surgeon: Rolan Ezra RAMAN, MD;  Location: [facility] INVASIVE CV LAB;  Service: Cardiovascular;  Laterality: N/A;   TENCKHOFF CATHETER INSERTION N/A 06/02/2024   Procedure: INSERTION, CATHETER, DIALYSIS, PERITONEAL;  Surgeon: Sheree Penne Bruckner, MD;  Location: [facility] OR;  Service: Vascular;  Laterality: N/A;   TONSILLECTOMY     age 44   VENOUS ANGIOPLASTY  01/21/2024   Procedure: VENOUS ANGIOPLASTY;  Surgeon: Serene Gaile ORN, MD;  Location: [facility] PV LAB;  Service: Cardiovascular;;  Cephalic Vein   VENOUS ANGIOPLASTY Left 03/15/2024   Procedure: VENOUS ANGIOPLASTY;  Surgeon: Pearline Norman RAMAN, MD;  Location: [facility];  Service: Cardiovascular;  Laterality: Left;  Mid-AVF/Cephalic Vein; Cephalic Arch   Family History  Problem Relation Age of Onset   Hypertension Mother    Stroke Mother    Diabetes Mother    Other Mother        enlarged heart   Hypertension Father    Hypertension Sister    Diabetes Sister    CAD Sister        enlarged heart   Kidney disease Sister    Diabetes Paternal Grandmother    Hypertension Daughter    Diabetes Daughter    Hypertension Son    Diabetes Son    Breast cancer Paternal Aunt    Social History:  reports that she has never smoked. She has never been exposed to tobacco smoke. She has never used smokeless tobacco. She reports that she does not currently use alcohol. She reports that she does not use drugs.  ROS: As per HPI otherwise negative.  Physical Exam: Vitals:   06/17/24 1000 06/17/24 1100 06/17/24 1208 06/17/24 1319  BP: (!) 162/83 (!) 149/86 (!) 140/82   Pulse:  86 88   Resp:  18 18   Temp:  99.1 F (37.3 C) 99 F (37.2 C) 100 F (37.8 C)  TempSrc:  Oral Oral Oral  SpO2:  98% 98%   Weight:      Height:         General: Well developed, well nourished, in no acute distress. Room air Head: Normocephalic, atraumatic, sclera non-icteric, mucus membranes are moist. Neck: Supple without lymphadenopathy/masses. JVD not  elevated. Lungs: Clear bilaterally to auscultation without wheezes, rales, or rhonchi. Breathing is unlabored. Heart: RRR with normal S1, S2. No murmurs, rubs, or gallops appreciated. Abdomen: Soft, mild generalized tenderness, no guarding or rebound tenderness, PD cath in L mid abd Musculoskeletal:  Strength and tone appear normal for age. Lower extremities: No edema or ischemic changes, no open wounds. Neuro: Alert and oriented X 3. Moves all extremities spontaneously. Psych:  Responds to questions appropriately with a normal affect. Dialysis Access: PD cath + LUE AVF +t/b  Allergies[1] Prior to Admission medications  Medication Sig Start Date End Date Taking? Authorizing Provider  acetaminophen  (TYLENOL ) 500 MG tablet Take 1,000 mg by mouth every 6 (six) hours as needed for moderate pain or headache.    [provider]  allopurinol  (ZYLOPRIM ) 100 MG tablet Take 100 mg by mouth in the morning. 01/27/21  [provider]  aspirin  EC 81 MG tablet Take 81 mg by mouth in the morning. Swallow whole.    [provider]  atorvastatin  (LIPITOR) 80 MG tablet Take 1 tablet (80 mg total) by mouth every evening. 01/06/24   Rolan Ezra RAMAN, MD  buPROPion  (WELLBUTRIN ) 100 MG tablet Take 100 mg by mouth 2 (two) times daily. 02/04/21   [provider]  calcitRIOL (ROCALTROL) 0.5 MCG capsule Take 0.5 mcg by mouth in the morning. Patient not taking: Reported on 05/30/2024    [provider]  carvedilol  (COREG ) 3.125 MG tablet Take 3.125 mg by mouth 2 (two) times daily.    [provider]  Cholecalciferol (D3) 25 MCG (1000 UT) capsule Take 1,000 Units by mouth daily.    [provider]  diphenhydrAMINE (BENADRYL) 25 MG tablet Take 25 mg by mouth every 6 (six) hours as needed for allergies or itching.    [provider]  ezetimibe  (ZETIA ) 10 MG tablet Take 1 tablet (10 mg total) by mouth daily. 03/09/24 06/07/24  Rolan Ezra RAMAN, MD   fluticasone  (FLONASE ) 50 MCG/ACT nasal spray Place 1 spray into both nostrils daily as needed for allergies or rhinitis.    [provider]  isosorbide  mononitrate (IMDUR ) 120 MG 24 hr tablet Take 120 mg by mouth at bedtime.    [provider]  LINZESS  145 MCG CAPS capsule TAKE 1 CAPSULE BY MOUTH DAILY BEFORE BREAKFAST. 05/12/23   Zehr, Jessica D, PA-C  loratadine  (CLARITIN ) 10 MG tablet Take 10 mg by mouth in the morning. 02/14/21   [provider]  Multiple Vitamin (MULTIVITAMIN ADULT) TABS Take 1 tablet by mouth daily with lunch.    [provider]  nitroGLYCERIN  (NITROSTAT ) 0.4 MG SL tablet Place 1 tablet (0.4 mg total) under the tongue every 5 (five) minutes as needed for chest pain. 03/07/21   [name], Elidia Sieving, MD  NOVOLOG  FLEXPEN 100 UNIT/ML FlexPen Inject 5 Units into the skin 3 (three) times daily as needed for high blood sugar. 01/12/21   [provider]  Omega-3 Fatty Acids (FISH OIL PO) Take 2,400 mg by mouth daily with lunch.    [provider]  OZEMPIC , 2 MG/DOSE, 8 MG/3ML SOPN Inject 2 mg into the skin every 7 (seven) days. 05/15/24   [provider]  pregabalin  (LYRICA ) 75 MG capsule Take 75 mg by mouth 2 (two) times daily. 02/03/21   [provider]  sertraline  (ZOLOFT ) 50 MG tablet Take 50 mg by mouth in the morning. 02/01/21   [provider]  sucroferric oxyhydroxide (VELPHORO ) 500 MG chewable tablet Chew 500 mg by mouth 3 (three) times daily with meals.    [provider]  torsemide  (DEMADEX ) 20 MG tablet Take 40 mg by mouth See admin instructions. Take 2 tablets (40 mg) by mouth on Wednesdays, Saturdays and Sundays in the morning (non dialysis days)    [provider]  traMADol  (ULTRAM ) 50 MG tablet Take 1 tablet (50 mg total) by mouth every 6 (six) hours as needed for severe pain (pain score 7-10). 06/02/24 06/02/25  Baglia, Corrina, PA-C  traZODone  (DESYREL ) 50 MG tablet Take 50  mg by mouth at bedtime.    [provider]  TURMERIC CURCUMIN PO Take 2,000 mg by mouth daily with lunch.    [provider]   Current Facility-Administered Medications  Medication Dose Route Frequency Provider Last Rate Last Admin   acetaminophen  (TYLENOL ) tablet 1,000 mg  1,000 mg Oral Q6H  PRN Laurence Locus, DO       buPROPion  (WELLBUTRIN ) tablet 100 mg  100 mg Oral BID Laurence Locus, DO   100 mg at 06/17/24 1432   carvedilol  (COREG ) tablet 3.125 mg  3.125 mg Oral BID Laurence Locus, DO   3.125 mg at 06/17/24 1432   [START ON 06/18/2024] Chlorhexidine  Gluconate Cloth 2 % PADS 6 each  6 each Topical Q0600 Alfreddie Consalvo R, PA-C       fidaxomicin  (DIFICID ) tablet 200 mg  200 mg Oral BID Sundil, Subrina, MD   200 mg at 06/17/24 0120   heparin  injection 5,000 Units  5,000 Units Subcutaneous Q8H Laurence Locus, DO   5,000 Units at 06/17/24 1432   insulin  aspart (novoLOG ) injection 0-5 Units  0-5 Units Subcutaneous QHS Lee, Subrina, MD       insulin  aspart (novoLOG ) injection 0-6 Units  0-6 Units Subcutaneous TID WC Sundil, Subrina, MD       isosorbide  mononitrate (IMDUR ) 24 hr tablet 120 mg  120 mg Oral QHS Laurence Locus, DO       ondansetron  (ZOFRAN ) tablet 4 mg  4 mg Oral Q6H PRN Laurence Locus, DO       Or   ondansetron  (ZOFRAN ) injection 4 mg  4 mg Intravenous Q6H PRN Laurence Locus, DO       potassium chloride  SA (KLOR-CON  M) CR tablet 20 mEq  20 mEq Oral Once Laurence Locus, DO       pregabalin  (LYRICA ) capsule 25 mg  25 mg Oral Daily Laurence Locus, DO   25 mg at 06/17/24 1432   saccharomyces boulardii (FLORASTOR) capsule 250 mg  250 mg Oral BID Laurence Locus, DO   250 mg at 06/17/24 1432   traMADol  (ULTRAM ) tablet 50 mg  50 mg Oral Q6H PRN Laurence Locus, DO       Facility-Administered Medications Ordered in Other Encounters  Medication Dose Route Frequency Provider Last Rate Last Admin   technetium pyrophosphate Tc 53m injection 21.2 millicurie  21.2 millicurie Intravenous Once Mona Vinie BROCKS, MD        Labs: Basic Metabolic Panel: Recent Labs  Lab 06/16/24 1303 06/17/24 0039 06/17/24 0531  NA 132* 129* 134*  K 2.8* 3.8 3.0*  CL 92* 93* 93*  CO2 18* 14* 20*  GLUCOSE 100* 151* 130*  BUN 107* 109* 106*  CREATININE 11.70* 12.10* 11.90*  CALCIUM  8.7* 8.4* 8.4*   Liver Function Tests: Recent Labs  Lab 06/16/24 1303  AST 22  ALT <5  ALKPHOS 54  BILITOT 0.3  PROT 7.2  ALBUMIN 3.9   Recent Labs  Lab 06/16/24 1303  LIPASE 30   No results for input(s): AMMONIA in the last 168 hours. CBC: Recent Labs  Lab 06/16/24 1303 06/17/24 0531  WBC 13.5* 13.2*  HGB 11.4* 12.2  HCT 33.4* 36.2  MCV 79.5* 79.4*  PLT 239 226   Studies/Results: CT ABDOMEN PELVIS W CONTRAST Result Date: 06/16/2024 EXAM: CT ABDOMEN AND PELVIS WITH CONTRAST 06/16/2024 07:29:54 PM TECHNIQUE: CT of the abdomen and pelvis was performed with the administration of 100 mL of iohexol  (OMNIPAQUE ) 300 MG/ML solution. Multiplanar reformatted images are provided for review. Automated exposure control, iterative reconstruction, and/or weight-based adjustment of the mA/kV was utilized to reduce the radiation dose to as low as reasonably achievable. COMPARISON: 5125. CLINICAL HISTORY: Eval possible intra-abdominal infection from new peritoneal dialysis catheter. Patient with unexplained fever. FINDINGS: LOWER CHEST: Calcifications in the visualized right coronary artery. LIVER: The liver is unremarkable. GALLBLADDER AND  BILE DUCTS: Small layering stones within the gallbladder. No biliary ductal dilatation. SPLEEN: No acute abnormality. PANCREAS: No acute abnormality. ADRENAL GLANDS: 1.2 cm left adrenal nodule is stable since the prior study and likely reflects adenoma. KIDNEYS, URETERS AND BLADDER: No stones in the kidneys or ureters. No hydronephrosis. No perinephric or periureteral stranding. Urinary bladder is unremarkable. GI AND BOWEL: Stomach demonstrates no acute abnormality. There appears to be mild wall thickening  involving the transverse colon, descending colon and possibly the sigmoid colon suggesting colitis. Recommend clinical correlation. No bowel obstruction. PERITONEUM AND RETROPERITONEUM: Peritoneal dialysis catheter terminates in the pelvis. Small amount of free fluid. No free air. VASCULATURE: Aorta is normal in caliber. Aortic atherosclerosis. Aortic catheter sclerosis. LYMPH NODES: No lymphadenopathy. REPRODUCTIVE ORGANS: Prior hysterectomy. No adnexal mass. BONES AND SOFT TISSUES: No acute osseous abnormality. No focal soft tissue abnormality. IMPRESSION: 1. Question mild wall thickening involving the transverse colon, descending colon, and possibly the sigmoid colon, suggestive of colitis. 2. Coronary artery disease, aortic atherosclerosis. 3. Cholelithiasis. Electronically signed by: Franky Crease MD 06/16/2024 07:36 PM EST RP Workstation: [id number]   CT Head Wo Contrast Result Date: 06/16/2024 EXAM: CT HEAD WITHOUT 06/16/2024 07:29:54 PM TECHNIQUE: CT of the head was performed without the administration of intravenous contrast. Automated exposure control, iterative reconstruction, and/or weight based adjustment of the mA/kV was utilized to reduce the radiation dose to as low as reasonably achievable. COMPARISON: 10/12/2023 CLINICAL HISTORY: Head trauma, moderate-severe; Syncopal episode and hit head. FINDINGS: BRAIN AND VENTRICLES: No acute intracranial hemorrhage. No mass effect or midline shift. No extra-axial fluid collection. No evidence of acute infarct. No hydrocephalus. ORBITS: No acute abnormality. SINUSES AND MASTOIDS: No acute abnormality. SOFT TISSUES AND SKULL: No acute skull fracture. No acute soft tissue abnormality. IMPRESSION: 1. No acute intracranial abnormality. Electronically signed by: Franky Stanford MD 06/16/2024 07:34 PM EST RP Workstation: [id number]   DG Shoulder Left Result Date: 06/16/2024 EXAM: 1 VIEW(S) XRAY OF THE LEFT SHOULDER 06/16/2024 06:57:13 PM COMPARISON: 02/03/2023  CLINICAL HISTORY: left shoulder pain after fall FINDINGS: BONES AND JOINTS: The glenohumeral joint is maintained. No fracture, subluxation, or dislocation. Degenerative changes in the [facility] joint with joint space narrowing and spurring. Spurring at the rotator cuff insertion at the greater tuberosity. SOFT TISSUES: No abnormal calcifications. Visualized lung is unremarkable. IMPRESSION: 1. No acute fracture, subluxation, or dislocation. Electronically signed by: Franky Crease MD 06/16/2024 07:32 PM EST RP Workstation: [id number]   DG Chest 2 View Result Date: 06/16/2024 CLINICAL DATA:  Fever. EXAM: CHEST - 2 VIEW COMPARISON:  12/18/2023 FINDINGS: Improved cardiomegaly from prior exam. Stable mediastinal contours. Slight central vascular congestion. No pleural effusion or pneumothorax. No focal airspace disease. No acute osseous findings. IMPRESSION: Improved cardiomegaly from prior exam. Slight central vascular congestion. Electronically Signed   By: Andrea Gasman M.D.   On: 06/16/2024 16:12    Dialysis Orders:  HHD Nx Stage - MTTF. EDW 102.5kg, 2K bath PD cath placed 06/02/24 - flushed 12/22, flushed 12/29  Assessment/Plan:  C. Diff Colitis: + toxin and colitis on imaging. On Dificid .  ESRD: On home HD, unable to dialyze for few days. K is low, no volume excess, but BUN high - plan short HD tonight, min UF, 4K bath.  New PD cath: Doubt peritonitis in light of clear cause of symptoms as above. If no improvement in symptoms tomorrow, will consider flush with culture/cell count.  Hypertension/volume: BP ok, no edema on exam.  Anemia: Hgb > 12, no ESA for now.  Metabolic  bone disease: Ca ok, resume home binders.  T2DM  Izetta Boehringer, PA-C 06/17/2024, 3:35 PM  [facility]     [1]  Allergies Allergen Reactions   Metformin And Related Nausea Only   Other     Clonidine  patch causes skin irritation    Hydrocodone Itching    Patient able to tolerate when taken with Benadryl.    Oxycodone -Acetaminophen  Itching    Patient able to tolerate when taken with Benadryl.

## 2024-06-17 NOTE — Progress Notes (Signed)
" ° °  Brief Progress Note   _____________________________________________________________________________________________________________  Patient Name: [name] Patient DOB: 1960-11-15 Date: 06/17/2024     Data: Patient being admitted to [facility].    Action: Patient assigned to 5W02.    Response:  Sent secure chat to [employer] with bed assignment. Bed being cleaned at this time.  ADDENDUM 1203: Bed is ready. Secure chat sent in message thread.  _____________________________________________________________________________________________________________  [facility] RN Expeditor Christeen Lai Please contact us  directly via secure chat (search for [facility]) or by calling us  at [phone] [facility]).  "

## 2024-06-17 NOTE — ED Notes (Signed)
 Called [facility] to transport the patient to [facility] 5W rm# 2

## 2024-06-17 NOTE — H&P (Addendum)
 " History and Physical    Maria Hudson FMW:[id number] DOB: [birth year] DOA: 06/16/2024  DOS: the patient was seen and examined on 06/16/2024  PCP: Shelda Atlas, MD   Patient coming from: Home  I have personally briefly reviewed patient's old medical records in [facility]  CC: N/V, diarrhea, fever for 3 days HPI: 64 year old African-American female with a history of end-stage renal disease on home hemodialysis on Monday, Tuesday, Thursday and Friday.  Patient only able to tolerate 3-hour dialysis sessions due to feeling bad.  She has also a history of type 2 diabetes, hypertension.  She recently had a peritoneal dialysis catheter placed by Dr. Sheree with vascular surgery on June 02, 2024.  Patient is attempting to transition to peritoneal dialysis.  She follows with Dr. Katheryn Messier with [facility].  A last 3 days, she has had bodyaches, nausea, vomiting and diarrhea.  She has had upwards of 8 watery stools per day.  She reported a episode of syncope when her diarrhea started when she was trying to get up from the toilet.  She came to the ER for evaluation.  She has complaints of rectal tenesmus after having a watery stool.  On arrival temp 101.1 heart rate 90 blood pressure 133/58 satting 96% on room air.  White count 13.5, hemoglobin 1.4, platelets of 239  Sodium 132, potassium 2.8, CO2 of 18, chloride of 92, BUN of 107, creatinine 11.7, glucose of 100  Total protein 7.2, albumin 3.9, AST 22, ALT of less than 5, alk phos of 54, total bili 0.3  Lipase at 30  COVID-negative, influenza negative, RSV negative  Lactic acid 0.7  Magnesium 2.1  She had 1 low blood sugar of 47  C. difficile toxin and antigen were both positive  RVP was negative  A1c of 6.3% indicating good outpatient control  CT abdomen pelvis demonstrated colitis of the transverse colon and descending colon.  There is cholelithiasis without evidence of acute cholecystitis.  CT head showed no  acute intracranial abnormality.  Left shoulder x-rays were negative for acute fracture.  Chest x-ray showed improved cardiomegaly.  Significant Events: Admitted 06/16/2024 for c. Diff diarrhea/colitis   Admission Labs: White count 13.5, hemoglobin 1.4, platelets of 239 Sodium 132, potassium 2.8, CO2 of 18, chloride of 92, BUN of 107, creatinine 11.7, glucose of 100 Total protein 7.2, albumin 3.9, AST 22, ALT of less than 5, alk phos of 54, total bili 0.3 Lipase at 30 COVID-negative, influenza negative, RSV negative Lactic acid 0.7 Magnesium 2.1 She had 1 low blood sugar of 47 C. difficile toxin and antigen were both positive  Admission Imaging Studies: CT abdomen pelvis demonstrated colitis of the transverse colon and descending colon.  There is cholelithiasis without evidence of acute cholecystitis. CT head showed no acute intracranial abnormality. Left shoulder x-rays were negative for acute fracture. Chest x-ray showed improved cardiomegaly.  Significant Labs:   Significant Imaging Studies:   Antibiotic Therapy: Anti-infectives (From admission, onward)    Start     Dose/Rate Route Frequency Ordered Stop   06/17/24 2015  ciprofloxacin  (CIPRO ) IVPB 400 mg  Status:  Discontinued        400 mg 200 mL/hr over 60 Minutes Intravenous Every 24 hours 06/16/24 2015 06/16/24 2334   06/17/24 0800  metroNIDAZOLE  (FLAGYL ) IVPB 500 mg  Status:  Discontinued        500 mg 100 mL/hr over 60 Minutes Intravenous Every 12 hours 06/16/24 2027 06/16/24 2334   06/17/24 0200  fidaxomicin  (DIFICID ) tablet 200 mg        200 mg Oral 2 times daily 06/16/24 2305 06/26/24 2159   06/17/24 0000  cefTRIAXone  (ROCEPHIN ) 2 g in sodium chloride  0.9 % 100 mL IVPB  Status:  Discontinued        2 g 200 mL/hr over 30 Minutes Intravenous Every 24 hours 06/16/24 2027 06/16/24 2334   06/17/24 0000  metroNIDAZOLE  (FLAGYL ) IVPB 500 mg  Status:  Discontinued        500 mg 100 mL/hr over 60 Minutes Intravenous  Every 12 hours 06/16/24 2027 06/16/24 2027   06/16/24 2015  ciprofloxacin  (CIPRO ) IVPB 400 mg  Status:  Discontinued        400 mg 200 mL/hr over 60 Minutes Intravenous Every 12 hours 06/16/24 2000 06/16/24 2015   06/16/24 2015  metroNIDAZOLE  (FLAGYL ) IVPB 500 mg  Status:  Discontinued        500 mg 100 mL/hr over 60 Minutes Intravenous Every 12 hours 06/16/24 2000 06/16/24 2051       Procedures:   Consultants: Nephrology  - Geralynn    ED Course: C. Diff Positive. Started on po dificid .  Review of Systems:  Review of Systems  Constitutional:  Positive for fever and malaise/fatigue.  HENT: Negative.    Eyes: Negative.   Respiratory: Negative.    Cardiovascular: Negative.   Gastrointestinal:  Positive for diarrhea, nausea and vomiting.       Rectal tenesmus after having bowel movement.  Genitourinary: Negative.   Musculoskeletal:  Positive for myalgias.  Skin: Negative.   Neurological:        Had a syncopal episode after standing up from the toilet 3 days ago.  This was around the time her diarrhea started.  Endo/Heme/Allergies: Negative.   Psychiatric/Behavioral: Negative.    All other systems reviewed and are negative.   Past Medical History:  Diagnosis Date   Acute on chronic diastolic heart failure (HCC) 03/05/2021   Anemia    Arthritis    CAD (coronary artery disease)    CHF (congestive heart failure) (HCC)    Colon polyps    CVA (cerebral vascular accident) (HCC)    Depression    Diabetes mellitus without complication (HCC)    type 2   Difficult intubation    Dyspnea    ESRD on hemodialysis (HCC)    Heart murmur    Hypertensive urgency 04/14/2021   Memory loss    mild   Myocardial infarction (HCC)    OSA (obstructive sleep apnea) 05/27/2021   uses CPAP 12-18 cm   Pneumonia    several times   Pure hypercholesterolemia 05/27/2021   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   Stroke [facility])     Past Surgical History:  Procedure Laterality Date   A/V  FISTULAGRAM Left 08/09/2023   Procedure: A/V Fistulagram;  Surgeon: Sheree Penne Bruckner, MD;  Location: [facility] INVASIVE CV LAB;  Service: Cardiovascular;  Laterality: Left;   A/V FISTULAGRAM Left 03/15/2024   Procedure: A/V Fistulagram;  Surgeon: Pearline Norman RAMAN, MD;  Location: [facility] PV LAB;  Service: Cardiovascular;  Laterality: Left;   A/V SHUNT INTERVENTION N/A 12/15/2023   Procedure: A/V SHUNT INTERVENTION;  Surgeon: Pearline Norman RAMAN, MD;  Location: [facility] PV LAB;  Service: Cardiovascular;  Laterality: N/A;   A/V SHUNT INTERVENTION Left 01/21/2024   Procedure: A/V SHUNT INTERVENTION;  Surgeon: Serene Gaile ORN, MD;  Location: [facility] PV LAB;  Service: Cardiovascular;  Laterality: Left;   ABDOMINAL HYSTERECTOMY     AV  FISTULA PLACEMENT Left 12/09/2021   Procedure: LEFT ARTERIOVENOUS (AV) FISTULA CREATION;  Surgeon: Sheree Penne Bruckner, MD;  Location: [facility] OR;  Service: Vascular;  Laterality: Left;   BIOPSY  09/08/2021   Procedure: BIOPSY;  Surgeon: Legrand Victory LITTIE DOUGLAS, MD;  Location: [facility] END[facility]COPY;  Service: Gastroenterology;;   CESAREAN SECTION     x 1   ESOPHAGOGASTRODUODENOSCOPY (EGD) WITH PROPOFOL  N/A 09/08/2021   Procedure: ESOPHAGOGASTRODUODENOSCOPY (EGD) WITH PROPOFOL ;  Surgeon: Legrand Victory LITTIE DOUGLAS, MD;  Location: [facility];  Service: Gastroenterology;  Laterality: N/A;  chest pain, epigastric pain   FISTULA SUPERFICIALIZATION Left 08/11/2023   Procedure: FISTULA SUPERFICIALIZATION LEFT UPPER ARM;  Surgeon: Magda Debby SAILOR, MD;  Location: [facility] OR;  Service: Vascular;  Laterality: Left;   INNER EAR SURGERY Bilateral    INSERTION OF DIALYSIS CATHETER Right 08/11/2023   Procedure: INSERTION OF DIALYSIS CATHETER;  Surgeon: Magda Debby SAILOR, MD;  Location: [facility] OR;  Service: Vascular;  Laterality: Right;   LAPAROSCOPIC LYSIS OF ADHESIONS N/A 06/02/2024   Procedure: LYSIS, ADHESIONS, LAPAROSCOPIC;  Surgeon: Sheree Penne Bruckner, MD;  Location: [facility] OR;  Service: Vascular;  Laterality: N/A;   RIGHT  HEART CATH N/A 05/14/2021   Procedure: RIGHT HEART CATH;  Surgeon: Rolan Ezra RAMAN, MD;  Location: [facility] INVASIVE CV LAB;  Service: Cardiovascular;  Laterality: N/A;   TENCKHOFF CATHETER INSERTION N/A 06/02/2024   Procedure: INSERTION, CATHETER, DIALYSIS, PERITONEAL;  Surgeon: Sheree Penne Bruckner, MD;  Location: [facility] OR;  Service: Vascular;  Laterality: N/A;   TONSILLECTOMY     age 70   VENOUS ANGIOPLASTY  01/21/2024   Procedure: VENOUS ANGIOPLASTY;  Surgeon: Serene Gaile ORN, MD;  Location: [facility];  Service: Cardiovascular;;  Cephalic Vein   VENOUS ANGIOPLASTY Left 03/15/2024   Procedure: VENOUS ANGIOPLASTY;  Surgeon: Pearline Norman RAMAN, MD;  Location: [facility];  Service: Cardiovascular;  Laterality: Left;  Mid-AVF/Cephalic Vein; Cephalic Arch     reports that she has never smoked. She has never been exposed to tobacco smoke. She has never used smokeless tobacco. She reports that she does not currently use alcohol. She reports that she does not use drugs.  Allergies[1]  Family History  Problem Relation Age of Onset   Hypertension Mother    Stroke Mother    Diabetes Mother    Other Mother        enlarged heart   Hypertension Father    Hypertension Sister    Diabetes Sister    CAD Sister        enlarged heart   Kidney disease Sister    Diabetes Paternal Grandmother    Hypertension Daughter    Diabetes Daughter    Hypertension Son    Diabetes Son    Breast cancer Paternal Aunt     Prior to Admission medications  Medication Sig Start Date End Date Taking? Authorizing Provider  acetaminophen  (TYLENOL ) 500 MG tablet Take 1,000 mg by mouth every 6 (six) hours as needed for moderate pain or headache.    [provider]  allopurinol  (ZYLOPRIM ) 100 MG tablet Take 100 mg by mouth in the morning. 01/27/21   [provider]  aspirin  EC 81 MG tablet Take 81 mg by mouth in the morning. Swallow whole.    [provider]  atorvastatin  (LIPITOR) 80 MG tablet Take  1 tablet (80 mg total) by mouth every evening. 01/06/24   Rolan Ezra RAMAN, MD  buPROPion  (WELLBUTRIN ) 100 MG tablet Take 100 mg by mouth 2 (two) times  daily. 02/04/21   [provider]  calcitRIOL (ROCALTROL) 0.5 MCG capsule Take 0.5 mcg by mouth in the morning. Patient not taking: Reported on 05/30/2024    [provider]  carvedilol  (COREG ) 3.125 MG tablet Take 3.125 mg by mouth 2 (two) times daily.    [provider]  Cholecalciferol (D3) 25 MCG (1000 UT) capsule Take 1,000 Units by mouth daily.    [provider]  diphenhydrAMINE (BENADRYL) 25 MG tablet Take 25 mg by mouth every 6 (six) hours as needed for allergies or itching.    [provider]  ezetimibe  (ZETIA ) 10 MG tablet Take 1 tablet (10 mg total) by mouth daily. 03/09/24 06/07/24  Rolan Ezra RAMAN, MD  fluticasone  (FLONASE ) 50 MCG/ACT nasal spray Place 1 spray into both nostrils daily as needed for allergies or rhinitis.    [provider]  isosorbide  mononitrate (IMDUR ) 120 MG 24 hr tablet Take 120 mg by mouth at bedtime.    [provider]  LINZESS  145 MCG CAPS capsule TAKE 1 CAPSULE BY MOUTH DAILY BEFORE BREAKFAST. 05/12/23   Zehr, Jessica D, PA-C  loratadine  (CLARITIN ) 10 MG tablet Take 10 mg by mouth in the morning. 02/14/21   [provider]  Multiple Vitamin (MULTIVITAMIN ADULT) TABS Take 1 tablet by mouth daily with lunch.    [provider]  nitroGLYCERIN  (NITROSTAT ) 0.4 MG SL tablet Place 1 tablet (0.4 mg total) under the tongue every 5 (five) minutes as needed for chest pain. 03/07/21   [name], Elidia Sieving, MD  NOVOLOG  FLEXPEN 100 UNIT/ML FlexPen Inject 5 Units into the skin 3 (three) times daily as needed for high blood sugar. 01/12/21   [provider]  Omega-3 Fatty Acids (FISH OIL PO) Take 2,400 mg by mouth daily with lunch.    [provider]  OZEMPIC , 2 MG/DOSE, 8 MG/3ML SOPN Inject 2 mg into the skin every 7 (seven) days.  05/15/24   [provider]  pregabalin  (LYRICA ) 75 MG capsule Take 75 mg by mouth 2 (two) times daily. 02/03/21   [provider]  sertraline  (ZOLOFT ) 50 MG tablet Take 50 mg by mouth in the morning. 02/01/21   [provider]  sucroferric oxyhydroxide (VELPHORO ) 500 MG chewable tablet Chew 500 mg by mouth 3 (three) times daily with meals.    [provider]  torsemide  (DEMADEX ) 20 MG tablet Take 40 mg by mouth See admin instructions. Take 2 tablets (40 mg) by mouth on Wednesdays, Saturdays and Sundays in the morning (non dialysis days)    [provider]  traMADol  (ULTRAM ) 50 MG tablet Take 1 tablet (50 mg total) by mouth every 6 (six) hours as needed for severe pain (pain score 7-10). 06/02/24 06/02/25  Baglia, Corrina, PA-C  traZODone  (DESYREL ) 50 MG tablet Take 50 mg by mouth at bedtime.    [provider]  TURMERIC CURCUMIN PO Take 2,000 mg by mouth daily with lunch.    [provider]    Physical Exam: Vitals:   06/17/24 0550 06/17/24 0956 06/17/24 1000 06/17/24 1100  BP: 133/70 (!) 164/80 (!) 162/83 (!) 149/86  Pulse: 90 88  86  Resp: 19 17  18   Temp: 98.5 F (36.9 C) 99.4 F (37.4 C)  99.1 F (37.3 C)  TempSrc: Oral Oral  Oral  SpO2: 99% 98%  98%  Weight:      Height:        Physical Exam Vitals and nursing note reviewed.  Constitutional:  General: She is not in acute distress.    Appearance: She is obese. She is not toxic-appearing or diaphoretic.  HENT:     Head: Normocephalic and atraumatic.     Nose: Nose normal.  Eyes:     General: No scleral icterus. Cardiovascular:     Rate and Rhythm: Normal rate and regular rhythm.  Pulmonary:     Effort: Pulmonary effort is normal.     Breath sounds: Normal breath sounds.  Abdominal:     General: Bowel sounds are normal. There is no distension.     Palpations: Abdomen is soft.     Comments: Tenckhoff catheter in the left mid quadrant.  Musculoskeletal:      Comments: Left upper extremity with positive bruit and thrill.  Skin:    General: Skin is warm and dry.     Capillary Refill: Capillary refill takes less than 2 seconds.  Neurological:     General: No focal deficit present.     Mental Status: She is alert and oriented to person, place, and time.      Labs on Admission: I have personally reviewed following labs and imaging studies  CBC: Recent Labs  Lab 06/16/24 1303 06/17/24 0531  WBC 13.5* 13.2*  HGB 11.4* 12.2  HCT 33.4* 36.2  MCV 79.5* 79.4*  PLT 239 226   Basic Metabolic Panel: Recent Labs  Lab 06/16/24 1303 06/16/24 2006 06/17/24 0039 06/17/24 0531  NA 132*  --  129* 134*  K 2.8*  --  3.8 3.0*  CL 92*  --  93* 93*  CO2 18*  --  14* 20*  GLUCOSE 100*  --  151* 130*  BUN 107*  --  109* 106*  CREATININE 11.70*  --  12.10* 11.90*  CALCIUM  8.7*  --  8.4* 8.4*  MG  --  2.1  --   --    GFR: Estimated Creatinine Clearance: 5.4 mL/min (A) (by C-G formula based on SCr of 11.9 mg/dL (H)). Liver Function Tests: Recent Labs  Lab 06/16/24 1303  AST 22  ALT <5  ALKPHOS 54  BILITOT 0.3  PROT 7.2  ALBUMIN 3.9   Recent Labs  Lab 06/16/24 1303  LIPASE 30   ProBNP, BNP (last 5 results) Recent Labs    12/18/23 1133  BNP 26.4   HbA1C: Recent Labs    06/16/24 2043  HGBA1C 6.3*   CBG: Recent Labs  Lab 06/16/24 2144 06/16/24 2222 06/16/24 2326 06/17/24 0815 06/17/24 1140  GLUCAP 47* 110* 114* 121* 103*   Urine analysis:    Component Value Date/Time   COLORURINE YELLOW 10/12/2023 0735   APPEARANCEUR HAZY (A) 10/12/2023 0735   LABSPEC 1.013 10/12/2023 0735   PHURINE 5.0 10/12/2023 0735   GLUCOSEU 150 (A) 10/12/2023 0735   HGBUR SMALL (A) 10/12/2023 0735   BILIRUBINUR NEGATIVE 10/12/2023 0735   KETONESUR NEGATIVE 10/12/2023 0735   PROTEINUR 100 (A) 10/12/2023 0735   NITRITE NEGATIVE 10/12/2023 0735   LEUKOCYTESUR SMALL (A) 10/12/2023 0735    Radiological Exams on Admission: I have personally  reviewed images CT ABDOMEN PELVIS W CONTRAST Result Date: 06/16/2024 EXAM: CT ABDOMEN AND PELVIS WITH CONTRAST 06/16/2024 07:29:54 PM TECHNIQUE: CT of the abdomen and pelvis was performed with the administration of 100 mL of iohexol  (OMNIPAQUE ) 300 MG/ML solution. Multiplanar reformatted images are provided for review. Automated exposure control, iterative reconstruction, and/or weight-based adjustment of the mA/kV was utilized to reduce the radiation dose to as low as reasonably achievable. COMPARISON: 5125. CLINICAL  HISTORY: Eval possible intra-abdominal infection from new peritoneal dialysis catheter. Patient with unexplained fever. FINDINGS: LOWER CHEST: Calcifications in the visualized right coronary artery. LIVER: The liver is unremarkable. GALLBLADDER AND BILE DUCTS: Small layering stones within the gallbladder. No biliary ductal dilatation. SPLEEN: No acute abnormality. PANCREAS: No acute abnormality. ADRENAL GLANDS: 1.2 cm left adrenal nodule is stable since the prior study and likely reflects adenoma. KIDNEYS, URETERS AND BLADDER: No stones in the kidneys or ureters. No hydronephrosis. No perinephric or periureteral stranding. Urinary bladder is unremarkable. GI AND BOWEL: Stomach demonstrates no acute abnormality. There appears to be mild wall thickening involving the transverse colon, descending colon and possibly the sigmoid colon suggesting colitis. Recommend clinical correlation. No bowel obstruction. PERITONEUM AND RETROPERITONEUM: Peritoneal dialysis catheter terminates in the pelvis. Small amount of free fluid. No free air. VASCULATURE: Aorta is normal in caliber. Aortic atherosclerosis. Aortic catheter sclerosis. LYMPH NODES: No lymphadenopathy. REPRODUCTIVE ORGANS: Prior hysterectomy. No adnexal mass. BONES AND SOFT TISSUES: No acute osseous abnormality. No focal soft tissue abnormality. IMPRESSION: 1. Question mild wall thickening involving the transverse colon, descending colon, and possibly  the sigmoid colon, suggestive of colitis. 2. Coronary artery disease, aortic atherosclerosis. 3. Cholelithiasis. Electronically signed by: Franky Crease MD 06/16/2024 07:36 PM EST RP Workstation: [id number]   CT Head Wo Contrast Result Date: 06/16/2024 EXAM: CT HEAD WITHOUT 06/16/2024 07:29:54 PM TECHNIQUE: CT of the head was performed without the administration of intravenous contrast. Automated exposure control, iterative reconstruction, and/or weight based adjustment of the mA/kV was utilized to reduce the radiation dose to as low as reasonably achievable. COMPARISON: 10/12/2023 CLINICAL HISTORY: Head trauma, moderate-severe; Syncopal episode and hit head. FINDINGS: BRAIN AND VENTRICLES: No acute intracranial hemorrhage. No mass effect or midline shift. No extra-axial fluid collection. No evidence of acute infarct. No hydrocephalus. ORBITS: No acute abnormality. SINUSES AND MASTOIDS: No acute abnormality. SOFT TISSUES AND SKULL: No acute skull fracture. No acute soft tissue abnormality. IMPRESSION: 1. No acute intracranial abnormality. Electronically signed by: Franky Stanford MD 06/16/2024 07:34 PM EST RP Workstation: [id number]   DG Shoulder Left Result Date: 06/16/2024 EXAM: 1 VIEW(S) XRAY OF THE LEFT SHOULDER 06/16/2024 06:57:13 PM COMPARISON: 02/03/2023 CLINICAL HISTORY: left shoulder pain after fall FINDINGS: BONES AND JOINTS: The glenohumeral joint is maintained. No fracture, subluxation, or dislocation. Degenerative changes in the [facility] joint with joint space narrowing and spurring. Spurring at the rotator cuff insertion at the greater tuberosity. SOFT TISSUES: No abnormal calcifications. Visualized lung is unremarkable. IMPRESSION: 1. No acute fracture, subluxation, or dislocation. Electronically signed by: Franky Crease MD 06/16/2024 07:32 PM EST RP Workstation: [id number]   DG Chest 2 View Result Date: 06/16/2024 CLINICAL DATA:  Fever. EXAM: CHEST - 2 VIEW COMPARISON:  12/18/2023 FINDINGS: Improved  cardiomegaly from prior exam. Stable mediastinal contours. Slight central vascular congestion. No pleural effusion or pneumothorax. No focal airspace disease. No acute osseous findings. IMPRESSION: Improved cardiomegaly from prior exam. Slight central vascular congestion. Electronically Signed   By: Andrea Gasman M.D.   On: 06/16/2024 16:12    EKG: My personal interpretation of EKG shows: NSR    Assessment/Plan Principal Problem:   C. difficile colitis Active Problems:   ESRD on dialysis (HCC)   T2DM (type 2 diabetes mellitus) (HCC)   Obesity, Class II, BMI 35-39.9   Chronic diastolic CHF (congestive heart failure) (HCC)   Diabetic neuropathy (HCC)   Refusal of blood product - pt is Jehovah's witness   Essential hypertension   Hypokalemia    Assessment and  Plan: * C. difficile colitis 06/17/2024 continue with Dificid  200 mg twice daily.  Patient having about 8 bowel movements a day.  Discussed with the patient that as long as she is not having nausea and vomiting and that she feels her diarrhea is manageable at home, she can be discharged to home.  CT abdomen pelvis is reassuring for lack of dilated bowels.  I think the biggest reason she needs to stay as she has missed hemodialysis for the last 2 sessions and she urgently needs hemodialysis.  Thankfully she is not volume overloaded probably because she is losing lots of fluid through her stools.  Her BUN is 106 and her creatinine is 11.9.  Nephrology has been consulted and will see her on arrival to [facility].  ESRD on dialysis [facility]) 06/17/2024 she is currently on home hemodialysis on Monday, Tuesday, Thursday, Friday.  She states she is only able to tolerate 3-hour sessions at a time due to fatigue when she gets 4-hour sessions.  She was transitioning to peritoneal dialysis.  She has not yet started peritoneal dialysis.  Nephrology will be consulting for routine dialysis when she arrives to [facility]   Essential  hypertension 06/17/2024 continue Coreg  and Imdur  with hold parameters.   Refusal of blood product - pt is Jehovah's witness 06/17/2024 patient is Jehovah's Witness   Diabetic neuropathy (HCC) 06/17/2024 continue with Lyrica  BID and as needed Ultram .   Chronic diastolic CHF (congestive heart failure) (HCC) 06/17/2024 currently euvolemic or slightly hypovolemic due to her diarrhea.   Obesity, Class II, BMI 35-39.9 06/17/2024 Body mass index is 38.09 kg/m.    T2DM (type 2 diabetes mellitus) (HCC) 06/17/2024 appetite is quite poor right now.  Continue only with sliding scale insulin .  Patient states that she is not taking Lantus  right now.  Hold on Ozempic  for now.   Hypokalemia 06/17/2024 K of 3.0 today. Had 40 meq of po kcl yesterday. Will cautiously give more kcl of 40 meq x 1 today. Pt has not yet had HD yet.    DVT prophylaxis: SQ Heparin  Code Status: Full Code Family Communication: no family at bedside. She is decisional.  Disposition Plan: home  Consults called: nephrology  Admission status: Inpatient, Med-Surg   Camellia Door, DO [facility] 06/17/2024, 12:17 PM       [1]  Allergies Allergen Reactions   Metformin And Related Nausea Only   Other     Clonidine  patch causes skin irritation    Hydrocodone Itching    Patient able to tolerate when taken with Benadryl.   Oxycodone -Acetaminophen  Itching    Patient able to tolerate when taken with Benadryl.   "

## 2024-06-17 NOTE — Assessment & Plan Note (Addendum)
 06/17/2024 currently euvolemic or slightly hypovolemic due to her diarrhea.

## 2024-06-17 NOTE — Assessment & Plan Note (Addendum)
 06/17/2024 continue Coreg  and Imdur  with hold parameters.

## 2024-06-17 NOTE — Assessment & Plan Note (Addendum)
 06/17/2024 patient is Jehovah's Witness

## 2024-06-17 NOTE — ED Notes (Signed)
 [facility] at bedside

## 2024-06-17 NOTE — Plan of Care (Signed)
 This 64 yrs old African-American female with history of ESRD on hemodialysis MTTF.  Patient was only able to tolerate 3 hours of dialysis session due to not feeling well.  She recently had a peritoneal dialysis catheter placed by Dr. Sheree with vascular surgery on December 1925.  She is planning to transition to peritoneal dialysis.  She follows with Dr. Katheryn with [facility].  Patient has developed generalized body ache, nausea vomiting and diarrhea.  She also reported an episode of syncope when her diarrhea started when she was trying to get out of toilet.  She was febrile on arrival in the ED.  WBC 13.5.  Lactic acid 0.7, COVID, influenza, RSV negative.  Stool for C. difficile toxin and antigen both were positive.  CT abdomen pelvis demonstrated colitis of the transverse colon and descending colon.  CT head shows no acute intracranial abnormality.  Patient came as a virtual admission.  Patient was seen and examined at bedside.  She is hemodynamically stable.  Patient is started on Dificid  200 mg twice daily.  Nephrology is consulted for hemodialysis.

## 2024-06-17 NOTE — Progress Notes (Signed)
 Patient received in the unit. A/O x 4. CCMD notified. Skin assessment done with charge RN. Admission team notified. Call bell within reach. Bed alarm ON.

## 2024-06-17 NOTE — Assessment & Plan Note (Addendum)
 06/17/2024 Body mass index is 38.09 kg/m.

## 2024-06-17 NOTE — Assessment & Plan Note (Addendum)
 06/17/2024 continue with Dificid  200 mg twice daily.  Patient having about 8 bowel movements a day.  Discussed with the patient that as long as she is not having nausea and vomiting and that she feels her diarrhea is manageable at home, she can be discharged to home.  CT abdomen pelvis is reassuring for lack of dilated bowels.  I think the biggest reason she needs to stay as she has missed hemodialysis for the last 2 sessions and she urgently needs hemodialysis.  Thankfully she is not volume overloaded probably because she is losing lots of fluid through her stools.  Her BUN is 106 and her creatinine is 11.9.  Nephrology has been consulted and will see her on arrival to [facility].

## 2024-06-17 NOTE — Assessment & Plan Note (Addendum)
 06/17/2024 she is currently on home hemodialysis on Monday, Tuesday, Thursday, Friday.  She states she is only able to tolerate 3-hour sessions at a time due to fatigue when she gets 4-hour sessions.  She was transitioning to peritoneal dialysis.  She has not yet started peritoneal dialysis.  Nephrology will be consulting for routine dialysis when she arrives to [facility].

## 2024-06-17 NOTE — Assessment & Plan Note (Signed)
 06/17/2024 K of 3.0 today. Had 40 meq of po kcl yesterday. Will cautiously give more kcl of 40 meq x 1 today. Pt has not yet had HD yet.

## 2024-06-17 NOTE — Plan of Care (Signed)
   Problem: Coping: Goal: Ability to adjust to condition or change in health will improve Outcome: Progressing   Problem: Health Behavior/Discharge Planning: Goal: Ability to manage health-related needs will improve Outcome: Progressing   Problem: Metabolic: Goal: Ability to maintain appropriate glucose levels will improve Outcome: Progressing   Problem: Nutritional: Goal: Maintenance of adequate nutrition will improve Outcome: Progressing

## 2024-06-17 NOTE — Assessment & Plan Note (Addendum)
 06/17/2024 appetite is quite poor right now.  Continue only with sliding scale insulin .  Patient states that she is not taking Lantus  right now.  Hold on Ozempic  for now.

## 2024-06-18 DIAGNOSIS — N186 End stage renal disease: Secondary | ICD-10-CM | POA: Diagnosis not present

## 2024-06-18 DIAGNOSIS — I5032 Chronic diastolic (congestive) heart failure: Secondary | ICD-10-CM

## 2024-06-18 DIAGNOSIS — Z992 Dependence on renal dialysis: Secondary | ICD-10-CM

## 2024-06-18 DIAGNOSIS — E1142 Type 2 diabetes mellitus with diabetic polyneuropathy: Secondary | ICD-10-CM

## 2024-06-18 DIAGNOSIS — A0472 Enterocolitis due to Clostridium difficile, not specified as recurrent: Secondary | ICD-10-CM | POA: Diagnosis not present

## 2024-06-18 LAB — COMPREHENSIVE METABOLIC PANEL WITH GFR
ALT: <5 U/L (ref 0–44)
AST: 29 U/L (ref 15–41)
Albumin: 3.4 g/dL — ABNORMAL LOW (ref 3.5–5.0)
Alkaline Phosphatase: 66 U/L (ref 38–126)
Anion gap: 25 — ABNORMAL HIGH (ref 5–15)
BUN: 115 mg/dL — ABNORMAL HIGH (ref 8–23)
CO2: 14 mmol/L — ABNORMAL LOW (ref 22–32)
Calcium: 8.8 mg/dL — ABNORMAL LOW (ref 8.9–10.3)
Chloride: 91 mmol/L — ABNORMAL LOW (ref 98–111)
Creatinine, Ser: 15 mg/dL — ABNORMAL HIGH (ref 0.44–1.00)
GFR, Estimated: 2 mL/min — ABNORMAL LOW
Glucose, Bld: 125 mg/dL — ABNORMAL HIGH (ref 70–99)
Potassium: 4.5 mmol/L (ref 3.5–5.1)
Sodium: 130 mmol/L — ABNORMAL LOW (ref 135–145)
Total Bilirubin: 0.3 mg/dL (ref 0.0–1.2)
Total Protein: 6.8 g/dL (ref 6.5–8.1)

## 2024-06-18 LAB — CBC WITH DIFFERENTIAL/PLATELET
Abs Immature Granulocytes: 0.33 K/uL — ABNORMAL HIGH (ref 0.00–0.07)
Basophils Absolute: 0.1 K/uL (ref 0.0–0.1)
Basophils Relative: 0 %
Eosinophils Absolute: 0 K/uL (ref 0.0–0.5)
Eosinophils Relative: 0 %
HCT: 35.8 % — ABNORMAL LOW (ref 36.0–46.0)
Hemoglobin: 12.2 g/dL (ref 12.0–15.0)
Immature Granulocytes: 1 %
Lymphocytes Relative: 3 %
Lymphs Abs: 0.8 K/uL (ref 0.7–4.0)
MCH: 27.2 pg (ref 26.0–34.0)
MCHC: 34.1 g/dL (ref 30.0–36.0)
MCV: 79.9 fL — ABNORMAL LOW (ref 80.0–100.0)
Monocytes Absolute: 0.9 K/uL (ref 0.1–1.0)
Monocytes Relative: 4 %
Neutro Abs: 20.7 K/uL — ABNORMAL HIGH (ref 1.7–7.7)
Neutrophils Relative %: 92 %
Platelets: 240 K/uL (ref 150–400)
RBC: 4.48 MIL/uL (ref 3.87–5.11)
RDW: 17.3 % — ABNORMAL HIGH (ref 11.5–15.5)
WBC: 22.8 K/uL — ABNORMAL HIGH (ref 4.0–10.5)
nRBC: 0 % (ref 0.0–0.2)

## 2024-06-18 LAB — GLUCOSE, CAPILLARY
Glucose-Capillary: 109 mg/dL — ABNORMAL HIGH (ref 70–99)
Glucose-Capillary: 88 mg/dL (ref 70–99)
Glucose-Capillary: 118 mg/dL — ABNORMAL HIGH (ref 70–99)
Glucose-Capillary: 153 mg/dL — ABNORMAL HIGH (ref 70–99)

## 2024-06-18 LAB — HEPATITIS B SURFACE ANTIBODY, QUANTITATIVE: Hep B S AB Quant (Post): 179 m[IU]/mL

## 2024-06-18 MED ORDER — ASPIRIN 81 MG PO TBEC
81.0000 mg | DELAYED_RELEASE_TABLET | Freq: Every day | ORAL | Status: DC
  Administered 2024-06-18 – 2024-06-25 (×8): 81 mg via ORAL
  Filled 2024-06-18 (×8): qty 1

## 2024-06-18 MED ORDER — TRAZODONE HCL 50 MG PO TABS
50.0000 mg | ORAL_TABLET | Freq: Every day | ORAL | Status: DC
  Administered 2024-06-18 – 2024-06-24 (×7): 50 mg via ORAL
  Filled 2024-06-18 (×7): qty 1

## 2024-06-18 MED ORDER — EZETIMIBE 10 MG PO TABS
10.0000 mg | ORAL_TABLET | Freq: Every day | ORAL | Status: DC
  Administered 2024-06-18 – 2024-06-25 (×8): 10 mg via ORAL
  Filled 2024-06-18 (×8): qty 1

## 2024-06-18 MED ORDER — ATORVASTATIN CALCIUM 80 MG PO TABS
80.0000 mg | ORAL_TABLET | Freq: Every evening | ORAL | Status: DC
  Administered 2024-06-18 – 2024-06-24 (×6): 80 mg via ORAL
  Filled 2024-06-18 (×6): qty 1

## 2024-06-18 NOTE — Plan of Care (Signed)

## 2024-06-18 NOTE — Progress Notes (Signed)
 "                        PROGRESS NOTE        PATIENT DETAILS Name: Maria Hudson Age: 64 y.o. Sex: female Date of Birth: 1961-06-02 Admit Date: 06/16/2024 Admitting Physician Camellia Door, DO ERE:Jcalzmz, Aliene, MD  Brief Summary: Patient is a 64 y.o.  female with history of ESRD on HD MWF-presented with nausea, vomiting, diarrhea, subjective fever, 1 episode of syncope (3 days prior to hospitalization)-found to have C. difficile colitis.    Significant events: 1/3>> admit to [facility]  Significant studies: 1/2>> chest x-ray: Central vascular congestion. 1/2>> x-ray left shoulder: No fracture 1/2>> CT abdomen/pelvis: Mild wall thickening of transverse/descending/sigmoid colon. 1/2>> CT head: No acute intracranial abnormality.  Significant microbiology data: 1/2>> COVID/influenza/RSV PCR: Negative 1/2>> stool C. difficile antigen/toxin: Positive 1/2>> GI pathogen panel: Not detected 1/2>> respiratory virus panel: Negative 1/2>> blood culture: Negative  Procedures: None  Consults: None  Subjective: Continues to have severe diarrhea-claims she had close to 10 loose watery stools overnight.  Objective: Vitals: Blood pressure (!) 121/48, pulse 96, temperature 99.5 F (37.5 C), temperature source Oral, resp. rate 19, height 5' 3 (1.6 m), weight 97.5 kg, SpO2 95%.   Exam: Gen Exam:Alert awake-not in any distress HEENT:atraumatic, normocephalic Chest: B/L clear to auscultation anteriorly CVS:S1S2 regular Abdomen:soft non tender, non distended Extremities:no edema Neurology: Non focal Skin: no rash  Pertinent Labs/Radiology:    Latest Ref Rng & Units 06/18/2024    3:43 AM 06/17/2024    5:31 AM 06/16/2024    1:03 PM  CBC  WBC 4.0 - 10.5 K/uL 22.8  13.2  13.5   Hemoglobin 12.0 - 15.0 g/dL 87.7  87.7  88.5   Hematocrit 36.0 - 46.0 % 35.8  36.2  33.4   Platelets 150 - 400 K/uL 240  226  239     Lab Results  Component Value Date   NA 130 (L) 06/18/2024   K 4.5 06/18/2024    CL 91 (L) 06/18/2024   CO2 14 (L) 06/18/2024      Assessment/Plan: C. difficile colitis Denies any prior antibiotic exposure in the past 3 months Continues to have diarrhea-approximately 10 loose episodes overnight-leukocytosis worse today Continue Dificid  Remain inpatient-needs close monitoring-given that she still has severe diarrhea.  Syncope Occurred 3 days prior to presentation Either vasovagal (defecation related) or orthostatic Check orthostatics Continue telemetry monitoring Echo  ESRD on HD MWF Nephrology following  Hyponatremia Mild-asymptomatic Likely secondary to impaired free water  excretion Should improve with HD. Follow electrolytes periodically.  Chronic HFpEF Euvolemic Volume removal with HD  CAD No anginal symptoms currently Aspirin /statin/beta-blocker  HTN BP stable Coreg /Imdur   DM-2 CBG stable SSI  HLD Statin/Zetia   Peripheral neuropathy Likely related to DM-2 Lyrica  As needed Ultram   Mood disorder Stable Continue Wellbutrin /trazodone   OSA CPAP nightly  [employer]-refuses blood products  Class 2 Obesity Estimated body mass index is 38.09 kg/m as calculated from the following:   Height as of this encounter: 5' 3 (1.6 m).   Weight as of this encounter: 97.5 kg.   Code status:   Code Status: Full Code   DVT Prophylaxis: heparin  injection 5,000 Units Start: 06/17/24 1400 SCDs Start: 06/17/24 1314   Family Communication: None at bedside   Disposition Plan: Status is: Inpatient Remains inpatient appropriate because: Severity of illness   Planned Discharge Destination:Home   Diet: Diet Order  Diet renal/carb modified with fluid restriction Diet-HS Snack? Nothing; Fluid restriction: 2000 mL Fluid; Room service appropriate? Yes; Fluid consistency: Thin  Diet effective now                     Antimicrobial agents: Anti-infectives (From admission, onward)    Start     Dose/Rate Route  Frequency Ordered Stop   06/17/24 2015  ciprofloxacin  (CIPRO ) IVPB 400 mg  Status:  Discontinued        400 mg 200 mL/hr over 60 Minutes Intravenous Every 24 hours 06/16/24 2015 06/16/24 2334   06/17/24 0800  metroNIDAZOLE  (FLAGYL ) IVPB 500 mg  Status:  Discontinued        500 mg 100 mL/hr over 60 Minutes Intravenous Every 12 hours 06/16/24 2027 06/16/24 2334   06/17/24 0200  fidaxomicin  (DIFICID ) tablet 200 mg        200 mg Oral 2 times daily 06/16/24 2305 06/26/24 2159   06/17/24 0000  cefTRIAXone  (ROCEPHIN ) 2 g in sodium chloride  0.9 % 100 mL IVPB  Status:  Discontinued        2 g 200 mL/hr over 30 Minutes Intravenous Every 24 hours 06/16/24 2027 06/16/24 2334   06/17/24 0000  metroNIDAZOLE  (FLAGYL ) IVPB 500 mg  Status:  Discontinued        500 mg 100 mL/hr over 60 Minutes Intravenous Every 12 hours 06/16/24 2027 06/16/24 2027   06/16/24 2015  ciprofloxacin  (CIPRO ) IVPB 400 mg  Status:  Discontinued        400 mg 200 mL/hr over 60 Minutes Intravenous Every 12 hours 06/16/24 2000 06/16/24 2015   06/16/24 2015  metroNIDAZOLE  (FLAGYL ) IVPB 500 mg  Status:  Discontinued        500 mg 100 mL/hr over 60 Minutes Intravenous Every 12 hours 06/16/24 2000 06/16/24 2051        MEDICATIONS: Scheduled Meds:  buPROPion   100 mg Oral BID   carvedilol   3.125 mg Oral BID   Chlorhexidine  Gluconate Cloth  6 each Topical Q0600   fidaxomicin   200 mg Oral BID   heparin   5,000 Units Subcutaneous Q8H   insulin  aspart  0-5 Units Subcutaneous QHS   insulin  aspart  0-6 Units Subcutaneous TID WC   isosorbide  mononitrate  120 mg Oral QHS   pregabalin   25 mg Oral Daily   saccharomyces boulardii  250 mg Oral BID   Continuous Infusions: PRN Meds:.acetaminophen , ondansetron  **OR** ondansetron  (ZOFRAN ) IV, traMADol    I have personally reviewed following labs and imaging studies  LABORATORY DATA: CBC: Recent Labs  Lab 06/16/24 1303 06/17/24 0531 06/18/24 0343  WBC 13.5* 13.2* 22.8*  NEUTROABS  --    --  20.7*  HGB 11.4* 12.2 12.2  HCT 33.4* 36.2 35.8*  MCV 79.5* 79.4* 79.9*  PLT 239 226 240    Basic Metabolic Panel: Recent Labs  Lab 06/16/24 1303 06/16/24 2006 06/17/24 0039 06/17/24 0531 06/18/24 0343  NA 132*  --  129* 134* 130*  K 2.8*  --  3.8 3.0* 4.5  CL 92*  --  93* 93* 91*  CO2 18*  --  14* 20* 14*  GLUCOSE 100*  --  151* 130* 125*  BUN 107*  --  109* 106* 115*  CREATININE 11.70*  --  12.10* 11.90* 15.00*  CALCIUM  8.7*  --  8.4* 8.4* 8.8*  MG  --  2.1  --   --   --     GFR: Estimated Creatinine Clearance: 4.3 mL/min (A) (  by C-G formula based on SCr of 15 mg/dL (H)).  Liver Function Tests: Recent Labs  Lab 06/16/24 1303 06/18/24 0343  AST 22 29  ALT <5 <5  ALKPHOS 54 66  BILITOT 0.3 0.3  PROT 7.2 6.8  ALBUMIN 3.9 3.4*   Recent Labs  Lab 06/16/24 1303  LIPASE 30   No results for input(s): AMMONIA in the last 168 hours.  Coagulation Profile: No results for input(s): INR, PROTIME in the last 168 hours.  Cardiac Enzymes: No results for input(s): CKTOTAL, CKMB, CKMBINDEX, TROPONINI in the last 168 hours.  BNP (last 3 results) No results for input(s): PROBNP in the last 8760 hours.  Lipid Profile: No results for input(s): CHOL, HDL, LDLCALC, TRIG, CHOLHDL, LDLDIRECT in the last 72 hours.  Thyroid  Function Tests: No results for input(s): TSH, T4TOTAL, FREET4, T3FREE, THYROIDAB in the last 72 hours.  Anemia Panel: No results for input(s): VITAMINB12, FOLATE, FERRITIN, TIBC, IRON, RETICCTPCT in the last 72 hours.  Urine analysis:    Component Value Date/Time   COLORURINE YELLOW 10/12/2023 0735   APPEARANCEUR HAZY (A) 10/12/2023 0735   LABSPEC 1.013 10/12/2023 0735   PHURINE 5.0 10/12/2023 0735   GLUCOSEU 150 (A) 10/12/2023 0735   HGBUR SMALL (A) 10/12/2023 0735   BILIRUBINUR NEGATIVE 10/12/2023 0735   KETONESUR NEGATIVE 10/12/2023 0735   PROTEINUR 100 (A) 10/12/2023 0735   NITRITE  NEGATIVE 10/12/2023 0735   LEUKOCYTESUR SMALL (A) 10/12/2023 0735    Sepsis Labs: Lactic Acid, Venous    Component Value Date/Time   LATICACIDVEN 0.8 06/16/2024 2006    MICROBIOLOGY: Recent Results (from the past 240 hours)  Resp panel by RT-PCR (RSV, Flu A&B, Covid) Anterior Nasal Swab     Status: None   Collection Time: 06/16/24  1:04 PM   Specimen: Anterior Nasal Swab  Result Value Ref Range Status   SARS Coronavirus 2 by RT PCR NEGATIVE NEGATIVE Final    Comment: (NOTE) SARS-CoV-2 target nucleic acids are NOT DETECTED.  The SARS-CoV-2 RNA is generally detectable in upper respiratory specimens during the acute phase of infection. The lowest concentration of SARS-CoV-2 viral copies this assay can detect is 138 copies/mL. A negative result does not preclude SARS-Cov-2 infection and should not be used as the sole basis for treatment or other patient management decisions. A negative result may occur with  improper specimen collection/handling, submission of specimen other than nasopharyngeal swab, presence of viral mutation(s) within the areas targeted by this assay, and inadequate number of viral copies(<138 copies/mL). A negative result must be combined with clinical observations, patient history, and epidemiological information. The expected result is Negative.  Fact Sheet for Patients:  bloggercourse.com  Fact Sheet for Healthcare Providers:  seriousbroker.it  This test is no t yet approved or cleared by the [place]  FDA and  has been authorized for detection and/or diagnosis of SARS-CoV-2 by FDA under an Emergency Use Authorization (EUA). This EUA will remain  in effect (meaning this test can be used) for the duration of the COVID-19 declaration under Section 564(b)(1) of the Act, 21 U.S.C.section 360bbb-3(b)(1), unless the authorization is terminated  or revoked sooner.       Influenza A by PCR NEGATIVE  NEGATIVE Final   Influenza B by PCR NEGATIVE NEGATIVE Final    Comment: (NOTE) The Xpert Xpress SARS-CoV-2/FLU/RSV plus assay is intended as an aid in the diagnosis of influenza from Nasopharyngeal swab specimens and should not be used as a sole basis for treatment. Nasal washings and aspirates  are unacceptable for Xpert Xpress SARS-CoV-2/FLU/RSV testing.  Fact Sheet for Patients: bloggercourse.com  Fact Sheet for Healthcare Providers: seriousbroker.it  This test is not yet approved or cleared by the [place]  FDA and has been authorized for detection and/or diagnosis of SARS-CoV-2 by FDA under an Emergency Use Authorization (EUA). This EUA will remain in effect (meaning this test can be used) for the duration of the COVID-19 declaration under Section 564(b)(1) of the Act, 21 U.S.C. section 360bbb-3(b)(1), unless the authorization is terminated or revoked.     Resp Syncytial Virus by PCR NEGATIVE NEGATIVE Final    Comment: (NOTE) Fact Sheet for Patients: bloggercourse.com  Fact Sheet for Healthcare Providers: seriousbroker.it  This test is not yet approved or cleared by the [place]  FDA and has been authorized for detection and/or diagnosis of SARS-CoV-2 by FDA under an Emergency Use Authorization (EUA). This EUA will remain in effect (meaning this test can be used) for the duration of the COVID-19 declaration under Section 564(b)(1) of the Act, 21 U.S.C. section 360bbb-3(b)(1), unless the authorization is terminated or revoked.  Performed at [facility], [address] [place], [place] [address]   Respiratory (~20 pathogens) panel by PCR     Status: None   Collection Time: 06/16/24  6:07 PM   Specimen: Nasopharyngeal Swab; Respiratory  Result Value Ref Range Status   Adenovirus NOT DETECTED NOT DETECTED Final   Coronavirus 229E NOT DETECTED  NOT DETECTED Final    Comment: (NOTE) The Coronavirus on the Respiratory Panel, DOES NOT test for the novel  Coronavirus (2019 nCoV)    Coronavirus HKU1 NOT DETECTED NOT DETECTED Final   Coronavirus NL63 NOT DETECTED NOT DETECTED Final   Coronavirus OC43 NOT DETECTED NOT DETECTED Final   Metapneumovirus NOT DETECTED NOT DETECTED Final   Rhinovirus / Enterovirus NOT DETECTED NOT DETECTED Final   Influenza A NOT DETECTED NOT DETECTED Final   Influenza B NOT DETECTED NOT DETECTED Final   Parainfluenza Virus 1 NOT DETECTED NOT DETECTED Final   Parainfluenza Virus 2 NOT DETECTED NOT DETECTED Final   Parainfluenza Virus 3 NOT DETECTED NOT DETECTED Final   Parainfluenza Virus 4 NOT DETECTED NOT DETECTED Final   Respiratory Syncytial Virus NOT DETECTED NOT DETECTED Final   Bordetella pertussis NOT DETECTED NOT DETECTED Final   Bordetella Parapertussis NOT DETECTED NOT DETECTED Final   Chlamydophila pneumoniae NOT DETECTED NOT DETECTED Final   Mycoplasma pneumoniae NOT DETECTED NOT DETECTED Final    Comment: Performed at [facility], [address], [place], [place] [address]  Blood culture (routine x 2)     Status: None (Preliminary result)   Collection Time: 06/16/24  6:14 PM   Specimen: BLOOD  Result Value Ref Range Status   Specimen Description   Final    BLOOD RIGHT ANTECUBITAL Performed at [facility], [address], [place], [place] [address]    Special Requests   Final    Blood Culture adequate volume Performed at [facility], [address], [place], [place] [address]    Culture   Final    NO GROWTH < 12 HOURS Performed at [facility], [address] [place], [place] [address]    Report Status PENDING  Incomplete  Blood culture (routine x 2)     Status: None (Preliminary result)   Collection Time: 06/16/24  6:19 PM   Specimen: BLOOD  Result Value Ref Range Status   Specimen Description   Final  BLOOD BLOOD RIGHT  ARM Performed at [facility], [address], [place], [place] [address]    Special Requests   Final    Blood Culture adequate volume Performed at [facility], [address], [place], [place] [address]    Culture   Final    NO GROWTH < 12 HOURS Performed at [facility], [address], [place], [place] [address]    Report Status PENDING  Incomplete  Gastrointestinal Panel by PCR , Stool     Status: None   Collection Time: 06/16/24  7:04 PM   Specimen: Stool  Result Value Ref Range Status   Campylobacter species NOT DETECTED NOT DETECTED Final   Plesimonas shigelloides NOT DETECTED NOT DETECTED Final   Salmonella species NOT DETECTED NOT DETECTED Final   Yersinia enterocolitica NOT DETECTED NOT DETECTED Final   Vibrio species NOT DETECTED NOT DETECTED Final   Vibrio cholerae NOT DETECTED NOT DETECTED Final   Enteroaggregative E coli (EAEC) NOT DETECTED NOT DETECTED Final   Enteropathogenic E coli (EPEC) NOT DETECTED NOT DETECTED Final   Enterotoxigenic E coli (ETEC) NOT DETECTED NOT DETECTED Final   Shiga like toxin producing E coli (STEC) NOT DETECTED NOT DETECTED Final   Shigella/Enteroinvasive E coli (EIEC) NOT DETECTED NOT DETECTED Final   Cryptosporidium NOT DETECTED NOT DETECTED Final   Cyclospora cayetanensis NOT DETECTED NOT DETECTED Final   Entamoeba histolytica NOT DETECTED NOT DETECTED Final   Giardia lamblia NOT DETECTED NOT DETECTED Final   Adenovirus F40/41 NOT DETECTED NOT DETECTED Final   Astrovirus NOT DETECTED NOT DETECTED Final   Norovirus GI/GII NOT DETECTED NOT DETECTED Final   Rotavirus A NOT DETECTED NOT DETECTED Final   Sapovirus (I, II, IV, and V) NOT DETECTED NOT DETECTED Final    Comment: Performed at [facility], [address] [place], [place] [address]  C Difficile Quick Screen w PCR reflex     Status: Abnormal   Collection Time: 06/16/24  7:04 PM   Specimen: Stool  Result Value Ref  Range Status   C Diff antigen POSITIVE (A) NEGATIVE Final   C Diff toxin POSITIVE (A) NEGATIVE Final    Comment: CRITICAL RESULT CALLED TO, READ BACK BY AND VERIFIED WITH: RN R. MONTGOMERY [id number] @2304  FH    C Diff interpretation Toxin producing C. difficile detected.  Final    Comment: Performed at [facility], [address], [place], [place] [address]    RADIOLOGY STUDIES/RESULTS: CT ABDOMEN PELVIS W CONTRAST Result Date: 06/16/2024 EXAM: CT ABDOMEN AND PELVIS WITH CONTRAST 06/16/2024 07:29:54 PM TECHNIQUE: CT of the abdomen and pelvis was performed with the administration of 100 mL of iohexol  (OMNIPAQUE ) 300 MG/ML solution. Multiplanar reformatted images are provided for review. Automated exposure control, iterative reconstruction, and/or weight-based adjustment of the mA/kV was utilized to reduce the radiation dose to as low as reasonably achievable. COMPARISON: 5125. CLINICAL HISTORY: Eval possible intra-abdominal infection from new peritoneal dialysis catheter. Patient with unexplained fever. FINDINGS: LOWER CHEST: Calcifications in the visualized right coronary artery. LIVER: The liver is unremarkable. GALLBLADDER AND BILE DUCTS: Small layering stones within the gallbladder. No biliary ductal dilatation. SPLEEN: No acute abnormality. PANCREAS: No acute abnormality. ADRENAL GLANDS: 1.2 cm left adrenal nodule is stable since the prior study and likely reflects adenoma. KIDNEYS, URETERS AND BLADDER: No stones in the kidneys or ureters. No hydronephrosis. No perinephric or periureteral stranding. Urinary bladder is unremarkable. GI AND BOWEL: Stomach demonstrates no acute abnormality. There  appears to be mild wall thickening involving the transverse colon, descending colon and possibly the sigmoid colon suggesting colitis. Recommend clinical correlation. No bowel obstruction. PERITONEUM AND RETROPERITONEUM: Peritoneal dialysis catheter terminates in the pelvis. Small amount of free fluid. No  free air. VASCULATURE: Aorta is normal in caliber. Aortic atherosclerosis. Aortic catheter sclerosis. LYMPH NODES: No lymphadenopathy. REPRODUCTIVE ORGANS: Prior hysterectomy. No adnexal mass. BONES AND SOFT TISSUES: No acute osseous abnormality. No focal soft tissue abnormality. IMPRESSION: 1. Question mild wall thickening involving the transverse colon, descending colon, and possibly the sigmoid colon, suggestive of colitis. 2. Coronary artery disease, aortic atherosclerosis. 3. Cholelithiasis. Electronically signed by: Franky Crease MD 06/16/2024 07:36 PM EST RP Workstation: [id number]   CT Head Wo Contrast Result Date: 06/16/2024 EXAM: CT HEAD WITHOUT 06/16/2024 07:29:54 PM TECHNIQUE: CT of the head was performed without the administration of intravenous contrast. Automated exposure control, iterative reconstruction, and/or weight based adjustment of the mA/kV was utilized to reduce the radiation dose to as low as reasonably achievable. COMPARISON: 10/12/2023 CLINICAL HISTORY: Head trauma, moderate-severe; Syncopal episode and hit head. FINDINGS: BRAIN AND VENTRICLES: No acute intracranial hemorrhage. No mass effect or midline shift. No extra-axial fluid collection. No evidence of acute infarct. No hydrocephalus. ORBITS: No acute abnormality. SINUSES AND MASTOIDS: No acute abnormality. SOFT TISSUES AND SKULL: No acute skull fracture. No acute soft tissue abnormality. IMPRESSION: 1. No acute intracranial abnormality. Electronically signed by: Franky Stanford MD 06/16/2024 07:34 PM EST RP Workstation: [id number]   DG Shoulder Left Result Date: 06/16/2024 EXAM: 1 VIEW(S) XRAY OF THE LEFT SHOULDER 06/16/2024 06:57:13 PM COMPARISON: 02/03/2023 CLINICAL HISTORY: left shoulder pain after fall FINDINGS: BONES AND JOINTS: The glenohumeral joint is maintained. No fracture, subluxation, or dislocation. Degenerative changes in the [facility] joint with joint space narrowing and spurring. Spurring at the rotator cuff insertion at the  greater tuberosity. SOFT TISSUES: No abnormal calcifications. Visualized lung is unremarkable. IMPRESSION: 1. No acute fracture, subluxation, or dislocation. Electronically signed by: Franky Crease MD 06/16/2024 07:32 PM EST RP Workstation: [id number]   DG Chest 2 View Result Date: 06/16/2024 CLINICAL DATA:  Fever. EXAM: CHEST - 2 VIEW COMPARISON:  12/18/2023 FINDINGS: Improved cardiomegaly from prior exam. Stable mediastinal contours. Slight central vascular congestion. No pleural effusion or pneumothorax. No focal airspace disease. No acute osseous findings. IMPRESSION: Improved cardiomegaly from prior exam. Slight central vascular congestion. Electronically Signed   By: Andrea Gasman M.D.   On: 06/16/2024 16:12     LOS: 1 day   Donalda Applebaum, MD  [facility]    To contact the attending provider between 7A-7P or the covering provider during after hours 7P-7A, please log into the web site [email or web address] and access using universal [place] password for that web site. If you do not have the password, please call the hospital operator.  06/18/2024, 8:14 AM    "

## 2024-06-18 NOTE — Progress Notes (Signed)
" °   06/18/24 1130  Vitals  BP (!) 130/95  MAP (mmHg) 84  BP Location Right Arm  BP Method Automatic  Patient Position (if appropriate) Lying  Pulse Rate 81  Pulse Rate Source Monitor  ECG Heart Rate 81  Resp (!) 21  Oxygen  Therapy  SpO2 100 %  O2 Device Nasal Cannula  O2 Flow Rate (L/min) 2 L/min  During Treatment Monitoring  Blood Flow Rate (mL/min) 349 mL/min  Arterial Pressure (mmHg) -194.74 mmHg  Venous Pressure (mmHg) 270.7 mmHg  TMP (mmHg) -2.63 mmHg  Ultrafiltration Rate (mL/min) 0 mL/min  Dialysate Flow Rate (mL/min) 300 ml/min  Dialysate Potassium Concentration 3  Dialysate Calcium  Concentration 2.5  Duration of HD Treatment -hour(s) 1.98 hour(s)  Cumulative Fluid Removed (mL) per Treatment  -280.18  HD Safety Checks Performed Yes  Intra-Hemodialysis Comments Tx completed  Post Treatment  Dialyzer Clearance Clear  Liters Processed 42.9  Fluid Removed (mL) 0 mL  Tolerated HD Treatment Yes  AVG/AVF Arterial Site Held (minutes) 10 minutes  AVG/AVF Venous Site Held (minutes) 10 minutes    "

## 2024-06-18 NOTE — Progress Notes (Signed)
 Orthostatic vitals Lying: BP 120/58 map 73 HR 96 Sitting: BP 129/60 map 78 HR 106 Standing: BP 137/78 map 93 HR 106

## 2024-06-18 NOTE — Plan of Care (Signed)
  Problem: Coping: Goal: Ability to adjust to condition or change in health will improve Outcome: Progressing   Problem: Fluid Volume: Goal: Ability to maintain a balanced intake and output will improve Outcome: Progressing   Problem: Skin Integrity: Goal: Risk for impaired skin integrity will decrease Outcome: Progressing   Problem: Tissue Perfusion: Goal: Adequacy of tissue perfusion will improve Outcome: Progressing   Problem: Education: Goal: Knowledge of General Education information will improve Description: Including pain rating scale, medication(s)/side effects and non-pharmacologic comfort measures Outcome: Progressing

## 2024-06-18 NOTE — Progress Notes (Signed)
 " [facility] Progress Note   Subjective:   Seen in [facility], for HD shortly. Still having diarrhea. No CP/dyspnea. Unclear how HD will go with diarrhea - try at least 2 hours, no UF, 3K -- explained rationale to patient, just need to knock down her BUN and correct acidosis.  Objective Vitals:   06/17/24 2001 06/17/24 2128 06/18/24 0555 06/18/24 0747  BP: (!) 119/50 (!) 127/52 (!) 104/50 (!) 121/48  Pulse: 87   96  Resp: 19     Temp: 99.5 F (37.5 C)  98.4 F (36.9 C) 99.5 F (37.5 C)  TempSrc: Oral  Oral Oral  SpO2: 97%   95%  Weight:      Height:       Physical Exam General: Ill appearing woman, NAD. Room air Heart: RRR Lungs: CTAB Abdomen: mild generalized tenderness without guarding, PD cath in place Extremities: no LE edema Dialysis Access: LUE AVF + PD cath  Additional Objective Labs: Basic Metabolic Panel: Recent Labs  Lab 06/17/24 0039 06/17/24 0531 06/18/24 0343  NA 129* 134* 130*  K 3.8 3.0* 4.5  CL 93* 93* 91*  CO2 14* 20* 14*  GLUCOSE 151* 130* 125*  BUN 109* 106* 115*  CREATININE 12.10* 11.90* 15.00*  CALCIUM  8.4* 8.4* 8.8*   Liver Function Tests: Recent Labs  Lab 06/16/24 1303 06/18/24 0343  AST 22 29  ALT <5 <5  ALKPHOS 54 66  BILITOT 0.3 0.3  PROT 7.2 6.8  ALBUMIN 3.9 3.4*   Recent Labs  Lab 06/16/24 1303  LIPASE 30   CBC: Recent Labs  Lab 06/16/24 1303 06/17/24 0531 06/18/24 0343  WBC 13.5* 13.2* 22.8*  NEUTROABS  --   --  20.7*  HGB 11.4* 12.2 12.2  HCT 33.4* 36.2 35.8*  MCV 79.5* 79.4* 79.9*  PLT 239 226 240   Blood Culture    Component Value Date/Time   SDES  06/16/2024 1819    BLOOD BLOOD RIGHT ARM Performed at [facility], [address] [place], [place] [address]    [facility]  06/16/2024 1819    Blood Culture adequate volume Performed at [facility], [address], [place], [place] [address]    CULT  06/16/2024 1819    NO GROWTH < 12 HOURS Performed at  [facility], [address], [place], [place] [address]    REPTSTATUS PENDING 06/16/2024 1819   Studies/Results: CT ABDOMEN PELVIS W CONTRAST Result Date: 06/16/2024 EXAM: CT ABDOMEN AND PELVIS WITH CONTRAST 06/16/2024 07:29:54 PM TECHNIQUE: CT of the abdomen and pelvis was performed with the administration of 100 mL of iohexol  (OMNIPAQUE ) 300 MG/ML solution. Multiplanar reformatted images are provided for review. Automated exposure control, iterative reconstruction, and/or weight-based adjustment of the mA/kV was utilized to reduce the radiation dose to as low as reasonably achievable. COMPARISON: 5125. CLINICAL HISTORY: Eval possible intra-abdominal infection from new peritoneal dialysis catheter. Patient with unexplained fever. FINDINGS: LOWER CHEST: Calcifications in the visualized right coronary artery. LIVER: The liver is unremarkable. GALLBLADDER AND BILE DUCTS: Small layering stones within the gallbladder. No biliary ductal dilatation. SPLEEN: No acute abnormality. PANCREAS: No acute abnormality. ADRENAL GLANDS: 1.2 cm left adrenal nodule is stable since the prior study and likely reflects adenoma. KIDNEYS, URETERS AND BLADDER: No stones in the kidneys or ureters. No hydronephrosis. No perinephric or periureteral stranding. Urinary bladder is unremarkable. GI AND BOWEL: Stomach demonstrates no acute abnormality. There appears to be mild wall thickening involving the transverse colon, descending colon and  possibly the sigmoid colon suggesting colitis. Recommend clinical correlation. No bowel obstruction. PERITONEUM AND RETROPERITONEUM: Peritoneal dialysis catheter terminates in the pelvis. Small amount of free fluid. No free air. VASCULATURE: Aorta is normal in caliber. Aortic atherosclerosis. Aortic catheter sclerosis. LYMPH NODES: No lymphadenopathy. REPRODUCTIVE ORGANS: Prior hysterectomy. No adnexal mass. BONES AND SOFT TISSUES: No acute osseous abnormality. No focal soft tissue abnormality.  IMPRESSION: 1. Question mild wall thickening involving the transverse colon, descending colon, and possibly the sigmoid colon, suggestive of colitis. 2. Coronary artery disease, aortic atherosclerosis. 3. Cholelithiasis. Electronically signed by: Franky Crease MD 06/16/2024 07:36 PM EST RP Workstation: [id number]   CT Head Wo Contrast Result Date: 06/16/2024 EXAM: CT HEAD WITHOUT 06/16/2024 07:29:54 PM TECHNIQUE: CT of the head was performed without the administration of intravenous contrast. Automated exposure control, iterative reconstruction, and/or weight based adjustment of the mA/kV was utilized to reduce the radiation dose to as low as reasonably achievable. COMPARISON: 10/12/2023 CLINICAL HISTORY: Head trauma, moderate-severe; Syncopal episode and hit head. FINDINGS: BRAIN AND VENTRICLES: No acute intracranial hemorrhage. No mass effect or midline shift. No extra-axial fluid collection. No evidence of acute infarct. No hydrocephalus. ORBITS: No acute abnormality. SINUSES AND MASTOIDS: No acute abnormality. SOFT TISSUES AND SKULL: No acute skull fracture. No acute soft tissue abnormality. IMPRESSION: 1. No acute intracranial abnormality. Electronically signed by: Franky Stanford MD 06/16/2024 07:34 PM EST RP Workstation: [id number]   DG Shoulder Left Result Date: 06/16/2024 EXAM: 1 VIEW(S) XRAY OF THE LEFT SHOULDER 06/16/2024 06:57:13 PM COMPARISON: 02/03/2023 CLINICAL HISTORY: left shoulder pain after fall FINDINGS: BONES AND JOINTS: The glenohumeral joint is maintained. No fracture, subluxation, or dislocation. Degenerative changes in the [facility] joint with joint space narrowing and spurring. Spurring at the rotator cuff insertion at the greater tuberosity. SOFT TISSUES: No abnormal calcifications. Visualized lung is unremarkable. IMPRESSION: 1. No acute fracture, subluxation, or dislocation. Electronically signed by: Franky Crease MD 06/16/2024 07:32 PM EST RP Workstation: [id number]   DG Chest 2 View Result  Date: 06/16/2024 CLINICAL DATA:  Fever. EXAM: CHEST - 2 VIEW COMPARISON:  12/18/2023 FINDINGS: Improved cardiomegaly from prior exam. Stable mediastinal contours. Slight central vascular congestion. No pleural effusion or pneumothorax. No focal airspace disease. No acute osseous findings. IMPRESSION: Improved cardiomegaly from prior exam. Slight central vascular congestion. Electronically Signed   By: Andrea Gasman M.D.   On: 06/16/2024 16:12   Medications:   aspirin  EC  81 mg Oral Daily   atorvastatin   80 mg Oral QPM   buPROPion   100 mg Oral BID   carvedilol   3.125 mg Oral BID   Chlorhexidine  Gluconate Cloth  6 each Topical Q0600   ezetimibe   10 mg Oral Daily   fidaxomicin   200 mg Oral BID   heparin   5,000 Units Subcutaneous Q8H   insulin  aspart  0-5 Units Subcutaneous QHS   insulin  aspart  0-6 Units Subcutaneous TID WC   isosorbide  mononitrate  120 mg Oral QHS   pregabalin   25 mg Oral Daily   saccharomyces boulardii  250 mg Oral BID   traZODone   50 mg Oral QHS    Dialysis Orders HHD Nx Stage - MTTF. EDW 102.5kg, 2K bath PD cath placed 06/02/24 - flushed 12/22, flushed 12/29   Assessment/Plan: C. Diff Colitis: + toxin and colitis on imaging. On Dificid . Ongoing diarrhea. WBC high. ESRD: On home HD, unable to dialyze for few days. K low on admit, better today. No volume excess, but BUN high, CO2 low - for HD today, try at  least 2 hours (still having diarrhea every 15 min). New PD cath: Doubt peritonitis in light of clear cause of symptoms as above. If no improvement in symptoms tomorrow, will consider flush with culture/cell count.  Hypertension/volume: BP ok, no edema on exam.  Anemia: Hgb > 12, no ESA for now.  Metabolic bone disease: Ca ok, Phos pending. Home off on binders until eating better.  T2DM   Izetta Boehringer, PA-C 06/18/2024, 8:36 AM  [facility]    "

## 2024-06-19 ENCOUNTER — Telehealth (HOSPITAL_COMMUNITY): Payer: Self-pay | Admitting: Pharmacy Technician

## 2024-06-19 ENCOUNTER — Other Ambulatory Visit (HOSPITAL_COMMUNITY): Payer: Self-pay

## 2024-06-19 ENCOUNTER — Inpatient Hospital Stay (HOSPITAL_COMMUNITY)

## 2024-06-19 DIAGNOSIS — E1142 Type 2 diabetes mellitus with diabetic polyneuropathy: Secondary | ICD-10-CM | POA: Diagnosis not present

## 2024-06-19 DIAGNOSIS — Z992 Dependence on renal dialysis: Secondary | ICD-10-CM | POA: Diagnosis not present

## 2024-06-19 DIAGNOSIS — R55 Syncope and collapse: Secondary | ICD-10-CM

## 2024-06-19 DIAGNOSIS — N186 End stage renal disease: Secondary | ICD-10-CM | POA: Diagnosis not present

## 2024-06-19 DIAGNOSIS — A0472 Enterocolitis due to Clostridium difficile, not specified as recurrent: Secondary | ICD-10-CM | POA: Diagnosis not present

## 2024-06-19 DIAGNOSIS — I5032 Chronic diastolic (congestive) heart failure: Secondary | ICD-10-CM | POA: Diagnosis not present

## 2024-06-19 LAB — CBC WITH DIFFERENTIAL/PLATELET
Abs Immature Granulocytes: 0.44 K/uL — ABNORMAL HIGH (ref 0.00–0.07)
Basophils Absolute: 0.1 K/uL (ref 0.0–0.1)
Basophils Relative: 1 %
Eosinophils Absolute: 0 K/uL (ref 0.0–0.5)
Eosinophils Relative: 0 %
HCT: 32.5 % — ABNORMAL LOW (ref 36.0–46.0)
Hemoglobin: 11.4 g/dL — ABNORMAL LOW (ref 12.0–15.0)
Immature Granulocytes: 2 %
Lymphocytes Relative: 2 %
Lymphs Abs: 0.5 K/uL — ABNORMAL LOW (ref 0.7–4.0)
MCH: 27.1 pg (ref 26.0–34.0)
MCHC: 35.1 g/dL (ref 30.0–36.0)
MCV: 77.2 fL — ABNORMAL LOW (ref 80.0–100.0)
Monocytes Absolute: 1 K/uL (ref 0.1–1.0)
Monocytes Relative: 3 %
Neutro Abs: 27.9 K/uL — ABNORMAL HIGH (ref 1.7–7.7)
Neutrophils Relative %: 92 %
Platelets: ADEQUATE K/uL (ref 150–400)
RBC: 4.21 MIL/uL (ref 3.87–5.11)
RDW: 17 % — ABNORMAL HIGH (ref 11.5–15.5)
WBC: 30 K/uL — ABNORMAL HIGH (ref 4.0–10.5)
nRBC: 0 % (ref 0.0–0.2)

## 2024-06-19 LAB — RENAL FUNCTION PANEL
Albumin: 3.3 g/dL — ABNORMAL LOW (ref 3.5–5.0)
Anion gap: 20 — ABNORMAL HIGH (ref 5–15)
BUN: 80 mg/dL — ABNORMAL HIGH (ref 8–23)
CO2: 21 mmol/L — ABNORMAL LOW (ref 22–32)
Calcium: 8.5 mg/dL — ABNORMAL LOW (ref 8.9–10.3)
Chloride: 91 mmol/L — ABNORMAL LOW (ref 98–111)
Creatinine, Ser: 11.8 mg/dL — ABNORMAL HIGH (ref 0.44–1.00)
GFR, Estimated: 3 mL/min — ABNORMAL LOW
Glucose, Bld: 161 mg/dL — ABNORMAL HIGH (ref 70–99)
Phosphorus: 5.2 mg/dL — ABNORMAL HIGH (ref 2.5–4.6)
Potassium: 3.7 mmol/L (ref 3.5–5.1)
Sodium: 132 mmol/L — ABNORMAL LOW (ref 135–145)

## 2024-06-19 LAB — GLUCOSE, CAPILLARY
Glucose-Capillary: 154 mg/dL — ABNORMAL HIGH (ref 70–99)
Glucose-Capillary: 224 mg/dL — ABNORMAL HIGH (ref 70–99)
Glucose-Capillary: 163 mg/dL — ABNORMAL HIGH (ref 70–99)
Glucose-Capillary: 199 mg/dL — ABNORMAL HIGH (ref 70–99)

## 2024-06-19 LAB — ECHOCARDIOGRAM COMPLETE
Area-P 1/2: 4.68 cm2
Calc EF: 61.9 %
Height: 63 in
S' Lateral: 2.93 cm
Single Plane A2C EF: 57.6 %
Single Plane A4C EF: 65.5 %
Weight: 3440 [oz_av]

## 2024-06-19 NOTE — Telephone Encounter (Signed)
 Patient Product/process Development Scientist completed.    The patient is insured through [employer]. Patient has Medicare and is not eligible for a copay card, but may be able to apply for patient assistance or Medicare RX Payment Plan (Patient Must reach out to their plan, if eligible for payment plan), if available.    Ran test claim for Dificid  200 mg and the current 10 day co-pay is $1.60.   This test claim was processed through [facility]- copay amounts may vary at other pharmacies due to pharmacy/plan contracts, or as the patient moves through the different stages of their insurance plan.     Reyes Sharps, CPHT Pharmacy Technician Patient Advocate Specialist Lead [facility] Patient Advocate Team Direct Number: [phone]  Fax: [phone]

## 2024-06-19 NOTE — TOC Initial Note (Signed)
 Transition of Care [facility]) - Initial/Assessment Note    Patient Details  Name: Maria Hudson MRN: [id number] Date of Birth: [birth year]  Transition of Care [facility]) CM/SW Contact:    Landry DELENA Senters, RN Phone Number: 06/19/2024, 1:12 PM  Clinical Narrative:                 RR:[id number] of end-stage renal disease on home hemodialysis on Monday, Tuesday, Thursday and Friday. Patient only able to tolerate 3-hour dialysis sessions due to feeling bad. She has also a history of type 2 diabetes, hypertension. She recently had a peritoneal dialysis catheter placed by Dr. Sheree with vascular surgery on June 02, 2024. Patient is attempting to transition to peritoneal dialysis. She follows with Dr. Katheryn Messier with [facility].   Patient lives with daughter and son, reports having support at home. Patient reports daughter will transport home at d/c. Patient has PCP, manages own medications, drives herself, DME reviewed-cane, rollator, shower bench, cpap.  Patient is on home HD M,T,Th,Fri and reports her daughter helps her with this. She did have a PD cath placed recently and is trying to transition to home PD.   Continued medical workup,  CM will continue to follow.   Expected Discharge Plan: Home/Self Care Barriers to Discharge: Continued Medical Work up   Patient Goals and CMS Choice            Expected Discharge Plan and Services       Living arrangements for the past 2 months: Single Family Home                                      Prior Living Arrangements/Services Living arrangements for the past 2 months: Single Family Home Lives with:: Self, Adult Children Patient language and need for interpreter reviewed:: Yes Do you feel safe going back to the place where you live?: Yes      Need for Family Participation in Patient Care: Yes (Comment) Care giver support system in place?: Yes (comment) Current home services: DME (cane, rollator, shower bench, CPAP) Criminal  Activity/Legal Involvement Pertinent to Current Situation/Hospitalization: No - Comment as needed  Activities of Daily Living   ADL Screening (condition at time of admission) Independently performs ADLs?: Yes (appropriate for developmental age) Is the patient deaf or have difficulty hearing?: No Does the patient have difficulty seeing, even when wearing glasses/contacts?: No Does the patient have difficulty concentrating, remembering, or making decisions?: No  Permission Sought/Granted                  Emotional Assessment Appearance:: Developmentally appropriate Attitude/Demeanor/Rapport: Engaged Affect (typically observed): Calm Orientation: : Oriented to Self, Oriented to Place, Oriented to  Time, Oriented to Situation Alcohol / Substance Use: Not Applicable Psych Involvement: No (comment)  Admission diagnosis:  Colitis [K52.9] Clostridium difficile colitis [A04.72] Acute colitis [K52.9] AKI (acute kidney injury) [N17.9] Fever, unspecified fever cause [R50.9] Patient Active Problem List   Diagnosis Date Noted   Hypokalemia 06/17/2024   Clostridium difficile colitis 06/17/2024   C. difficile colitis 06/16/2024   ESRD on dialysis (HCC) 10/12/2023   History of colonic polyps 12/28/2022   Chronic constipation 03/31/2022   Pure hypercholesterolemia 05/27/2021   CAD (coronary artery disease) 03/05/2021   T2DM (type 2 diabetes mellitus) (HCC) 03/05/2021   Obesity, Class II, BMI 35-39.9 03/05/2021   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   Vitreous hemorrhage of left eye (HCC)  11/14/2020   Vision loss of right eye 08/06/2020   Excessive daytime sleepiness 01/11/2020   Vitreous hemorrhage, right (HCC) 12/04/2019   Diabetic neuropathy (HCC) 07/26/2019   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) 02/03/2019   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   Conductive hearing loss, bilateral 03/03/2018   Chronic  bilateral low back pain without sciatica 08/21/2015   Type 2 diabetes mellitus with diabetic polyneuropathy, with long-term current use of insulin  (HCC) 02/15/2015   Advance directive on file 05/17/2014   Refusal of blood product - pt is Jehovah's witness 05/17/2014   OSA treated with BiPAP 02/23/2014   Coronary artery disease 07/07/2013   Neuropathy 04/11/2013   Chronic diastolic CHF (congestive heart failure) (HCC) 10/16/2012   Essential hypertension 10/16/2012   PCP:  Shelda Atlas, MD Pharmacy:   [id number] [place], [place] - [address] [place] [address] Phone: [phone] Fax: [phone]  [id number] [place], [place] - [address] AT [facility] [address] [address] [facility] [address] [place] [address] Phone: [phone] Fax: [phone]  [facility] [address] [place] [address] Phone: [phone] Fax: [phone]     Social Drivers of Health (SDOH) Social History: SDOH Screenings   Food Insecurity: No Food Insecurity (06/17/2024)  Housing: Low Risk (06/17/2024)  Transportation Needs: No Transportation Needs (06/17/2024)  Utilities: Not At Risk (06/17/2024)  Alcohol Screen: Low Risk (06/17/2023)  Depression (PHQ2-9): Low Risk (05/12/2023)  Financial Resource Strain: Low Risk (05/31/2023)   Received from [facility]  Physical Activity: Inactive (08/23/2023)  Social Connections: Moderately Isolated (08/17/2023)  Stress: No Stress Concern Present (08/23/2023)  Tobacco Use: Low Risk (06/17/2024)  Health Literacy: Adequate Health Literacy (07/19/2023)   SDOH Interventions:     Readmission Risk Interventions     No data to display

## 2024-06-19 NOTE — Progress Notes (Signed)
" °  [facility] Progress Note   Subjective:   Seen in room - diarrhea slightly improved. Made it through 2hr HD yesterday - BUN a little better.  Objective Vitals:   06/18/24 1623 06/18/24 1930 06/18/24 2130 06/19/24 0710  BP:  118/63 132/63 114/73  Pulse:  86  87  Resp:  18  17  Temp: 100.1 F (37.8 C) 98.9 F (37.2 C)  98.1 F (36.7 C)  TempSrc:  Oral  Axillary  SpO2:  93%    Weight:      Height:       Physical Exam General: Ill appearing woman, NAD. Room air Heart: RRR Lungs: CTAB Abdomen: mild generalized tenderness without guarding, PD cath in place Extremities: no LE edema Dialysis Access: LUE AVF + PD cath   Additional Objective Labs: Basic Metabolic Panel: Recent Labs  Lab 06/17/24 0531 06/18/24 0343 06/19/24 0328  NA 134* 130* 132*  K 3.0* 4.5 3.7  CL 93* 91* 91*  CO2 20* 14* 21*  GLUCOSE 130* 125* 161*  BUN 106* 115* 80*  CREATININE 11.90* 15.00* 11.80*  CALCIUM  8.4* 8.8* 8.5*  PHOS  --   --  5.2*   Liver Function Tests: Recent Labs  Lab 06/16/24 1303 06/18/24 0343 06/19/24 0328  AST 22 29  --   ALT <5 <5  --   ALKPHOS 54 66  --   BILITOT 0.3 0.3  --   PROT 7.2 6.8  --   ALBUMIN 3.9 3.4* 3.3*   Recent Labs  Lab 06/16/24 1303  LIPASE 30   CBC: Recent Labs  Lab 06/16/24 1303 06/17/24 0531 06/18/24 0343 06/19/24 0328  WBC 13.5* 13.2* 22.8* 30.0*  NEUTROABS  --   --  20.7* 27.9*  HGB 11.4* 12.2 12.2 11.4*  HCT 33.4* 36.2 35.8* 32.5*  MCV 79.5* 79.4* 79.9* 77.2*  PLT 239 226 240 PLATELET CLUMPS NOTED ON SMEAR, COUNT APPEARS ADEQUATE   Medications:   aspirin  EC  81 mg Oral Daily   atorvastatin   80 mg Oral QPM   buPROPion   100 mg Oral BID   carvedilol   3.125 mg Oral BID   Chlorhexidine  Gluconate Cloth  6 each Topical Q0600   ezetimibe   10 mg Oral Daily   fidaxomicin   200 mg Oral BID   heparin   5,000 Units Subcutaneous Q8H   insulin  aspart  0-5 Units Subcutaneous QHS   insulin  aspart  0-6 Units Subcutaneous TID WC    isosorbide  mononitrate  120 mg Oral QHS   pregabalin   25 mg Oral Daily   saccharomyces boulardii  250 mg Oral BID   traZODone   50 mg Oral QHS    Dialysis Orders HHD Nx Stage - MTTF. EDW 102.5kg, 2K bath PD cath placed 06/02/24 - flushed 12/22, flushed 12/29   Assessment/Plan: C. Diff Colitis: + toxin and colitis on imaging. On Dificid . Ongoing diarrhea, WBC rising, but symptoms improving. ESRD: On home HD, unable to dialyze for few days. S/p HD 06/18/24. Will follow HD on TTS schedule while here - next 1/6. New PD cath: Doubt peritonitis in light of clear cause of symptoms as above. Will flush tomorrow.  Hypertension/volume: BP ok, no edema on exam.  Anemia: Hgb > 12, no ESA for now.  Metabolic bone disease: Ca/Phos ok. Home off on binders until eating better.  T2DM   Izetta Boehringer, PA-C 06/19/2024, 11:43 AM  [facility]    "

## 2024-06-19 NOTE — Plan of Care (Signed)

## 2024-06-19 NOTE — Plan of Care (Signed)
" °  Problem: Coping: Goal: Ability to adjust to condition or change in health will improve Outcome: Progressing   Problem: Health Behavior/Discharge Planning: Goal: Ability to manage health-related needs will improve Outcome: Progressing   Problem: Metabolic: Goal: Ability to maintain appropriate glucose levels will improve Outcome: Progressing   Problem: Nutritional: Goal: Maintenance of adequate nutrition will improve Outcome: Progressing   Problem: Education: Goal: Knowledge of General Education information will improve Description: Including pain rating scale, medication(s)/side effects and non-pharmacologic comfort measures Outcome: Progressing   Problem: Health Behavior/Discharge Planning: Goal: Ability to manage health-related needs will improve Outcome: Progressing   Problem: Clinical Measurements: Goal: Ability to maintain clinical measurements within normal limits will improve Outcome: Progressing Goal: Cardiovascular complication will be avoided Outcome: Progressing   Problem: Activity: Goal: Risk for activity intolerance will decrease Outcome: Progressing   "

## 2024-06-19 NOTE — Progress Notes (Signed)
 Pt receives home HD via [facility] therapies. Informed them of her arrival to hospital. Will continue to assist as needed.   [facility] Navigator [phone]

## 2024-06-19 NOTE — Progress Notes (Signed)
 Orthostatic vitals Lying: BP 106/54 map 70 HR 79 Sitting: BP 121/71 map 86  HR 82 Standing:BP 102/74 map 84 HR 86

## 2024-06-19 NOTE — Progress Notes (Signed)
" °  Echocardiogram 2D Echocardiogram has been performed.  Devora Ellouise SAUNDERS 06/19/2024, 10:51 AM "

## 2024-06-19 NOTE — Progress Notes (Signed)
 "                        PROGRESS NOTE        PATIENT DETAILS Name: Maria Hudson Age: 64 y.o. Sex: female Date of Birth: 1960/08/18 Admit Date: 06/16/2024 Admitting Physician Camellia Door, DO ERE:Jcalzmz, Aliene, MD  Brief Summary: Patient is a 64 y.o.  female with history of ESRD on HD MWF-presented with nausea, vomiting, diarrhea, subjective fever, 1 episode of syncope (3 days prior to hospitalization)-found to have C. difficile colitis.    Significant events: 1/3>> admit to [facility]  Significant studies: 1/2>> chest x-ray: Central vascular congestion. 1/2>> x-ray left shoulder: No fracture 1/2>> CT abdomen/pelvis: Mild wall thickening of transverse/descending/sigmoid colon. 1/2>> CT head: No acute intracranial abnormality.  Significant microbiology data: 1/2>> COVID/influenza/RSV PCR: Negative 1/2>> stool C. difficile antigen/toxin: Positive 1/2>> GI pathogen panel: Not detected 1/2>> respiratory virus panel: Negative 1/2>> blood culture: Negative  Procedures: None  Consults: None  Subjective: Significant improvement in diarrhea-only 3 loose stools overnight.  Stools also getting more formed.  No abdominal pain.  No dysuria/chest pain or shortness of breath or cough.  Objective: Vitals: Blood pressure 114/73, pulse 87, temperature 98.1 F (36.7 C), temperature source Axillary, resp. rate 17, height 5' 3 (1.6 m), weight 97.5 kg, SpO2 93%.   Exam: Gen Exam:Alert awake-not in any distress HEENT:atraumatic, normocephalic Chest: B/L clear to auscultation anteriorly CVS:S1S2 regular Abdomen:soft non tender, non distended Extremities:no edema Neurology: Non focal Skin: no rash  Pertinent Labs/Radiology:    Latest Ref Rng & Units 06/19/2024    3:28 AM 06/18/2024    3:43 AM 06/17/2024    5:31 AM  CBC  WBC 4.0 - 10.5 K/uL 30.0  22.8  13.2   Hemoglobin 12.0 - 15.0 g/dL 88.5  87.7  87.7   Hematocrit 36.0 - 46.0 % 32.5  35.8  36.2   Platelets 150 - 400 K/uL PLATELET CLUMPS  NOTED ON SMEAR, COUNT APPEARS ADEQUATE  240  226     Lab Results  Component Value Date   NA 132 (L) 06/19/2024   K 3.7 06/19/2024   CL 91 (L) 06/19/2024   CO2 21 (L) 06/19/2024      Assessment/Plan: C. difficile colitis Denies any prior antibiotic exposure in the past 3 months Diarrhea gradually improving-less frequent-stools getting formed but leukocytosis continues to worsen.  Suspect leukocytosis lagging behind clinical improvement Appears reasonably stable-nontoxic-appearing Continue Dificid  Continue to watch for a few more days before consideration of discharge.    Syncope Occurred 3 days prior to presentation Either vasovagal (defecation related) or orthostatic Check orthostatics Mobilize with PT/OT Continue telemetry monitoring Await echo  ESRD on HD MTWF (home hemodialysis) Nephrology following Recently had PD catheter placed for anticipation of initiation of peritoneal dialysis.  Discussed with nephrology-they will place routine PD catheter care orders.  Hyponatremia Mild-asymptomatic Likely secondary to impaired free water  excretion Should improve with HD. Follow electrolytes periodically.  Chronic HFpEF Euvolemic Volume removal with HD  CAD No anginal symptoms currently Aspirin /statin/beta-blocker  HTN BP stable Coreg /Imdur   DM-2 (A1c 6.3 on 1/2) CBG stable SSI while inpatient, resume usual outpatient regimen on discharge.  Recent Labs    06/18/24 1621 06/18/24 2109 06/19/24 0758  GLUCAP 118* 153* 154*     HLD Statin/Zetia   Peripheral neuropathy Likely related to DM-2 Lyrica  As needed Ultram   Mood disorder Stable Continue Wellbutrin /trazodone   OSA CPAP nightly  [employer]-refuses blood products  Class 2 Obesity  Estimated body mass index is 38.09 kg/m as calculated from the following:   Height as of this encounter: 5' 3 (1.6 m).   Weight as of this encounter: 97.5 kg.   Code status:   Code Status: Full Code   DVT  Prophylaxis: Place TED hose Start: 06/18/24 0829 heparin  injection 5,000 Units Start: 06/17/24 1400 SCDs Start: 06/17/24 1314   Family Communication: None at bedside   Disposition Plan: Status is: Inpatient Remains inpatient appropriate because: Severity of illness   Planned Discharge Destination:Home   Diet: Diet Order             Diet renal/carb modified with fluid restriction Diet-HS Snack? Nothing; Fluid restriction: 2000 mL Fluid; Room service appropriate? Yes; Fluid consistency: Thin  Diet effective now                     Antimicrobial agents: Anti-infectives (From admission, onward)    Start     Dose/Rate Route Frequency Ordered Stop   06/17/24 2015  ciprofloxacin  (CIPRO ) IVPB 400 mg  Status:  Discontinued        400 mg 200 mL/hr over 60 Minutes Intravenous Every 24 hours 06/16/24 2015 06/16/24 2334   06/17/24 0800  metroNIDAZOLE  (FLAGYL ) IVPB 500 mg  Status:  Discontinued        500 mg 100 mL/hr over 60 Minutes Intravenous Every 12 hours 06/16/24 2027 06/16/24 2334   06/17/24 0200  fidaxomicin  (DIFICID ) tablet 200 mg        200 mg Oral 2 times daily 06/16/24 2305 06/26/24 2159   06/17/24 0000  cefTRIAXone  (ROCEPHIN ) 2 g in sodium chloride  0.9 % 100 mL IVPB  Status:  Discontinued        2 g 200 mL/hr over 30 Minutes Intravenous Every 24 hours 06/16/24 2027 06/16/24 2334   06/17/24 0000  metroNIDAZOLE  (FLAGYL ) IVPB 500 mg  Status:  Discontinued        500 mg 100 mL/hr over 60 Minutes Intravenous Every 12 hours 06/16/24 2027 06/16/24 2027   06/16/24 2015  ciprofloxacin  (CIPRO ) IVPB 400 mg  Status:  Discontinued        400 mg 200 mL/hr over 60 Minutes Intravenous Every 12 hours 06/16/24 2000 06/16/24 2015   06/16/24 2015  metroNIDAZOLE  (FLAGYL ) IVPB 500 mg  Status:  Discontinued        500 mg 100 mL/hr over 60 Minutes Intravenous Every 12 hours 06/16/24 2000 06/16/24 2051        MEDICATIONS: Scheduled Meds:  aspirin  EC  81 mg Oral Daily   atorvastatin    80 mg Oral QPM   buPROPion   100 mg Oral BID   carvedilol   3.125 mg Oral BID   Chlorhexidine  Gluconate Cloth  6 each Topical Q0600   ezetimibe   10 mg Oral Daily   fidaxomicin   200 mg Oral BID   heparin   5,000 Units Subcutaneous Q8H   insulin  aspart  0-5 Units Subcutaneous QHS   insulin  aspart  0-6 Units Subcutaneous TID WC   isosorbide  mononitrate  120 mg Oral QHS   pregabalin   25 mg Oral Daily   saccharomyces boulardii  250 mg Oral BID   traZODone   50 mg Oral QHS   Continuous Infusions: PRN Meds:.acetaminophen , ondansetron  **OR** ondansetron  (ZOFRAN ) IV, traMADol    I have personally reviewed following labs and imaging studies  LABORATORY DATA: CBC: Recent Labs  Lab 06/16/24 1303 06/17/24 0531 06/18/24 0343 06/19/24 0328  WBC 13.5* 13.2* 22.8* 30.0*  NEUTROABS  --   --  20.7* 27.9*  HGB 11.4* 12.2 12.2 11.4*  HCT 33.4* 36.2 35.8* 32.5*  MCV 79.5* 79.4* 79.9* 77.2*  PLT 239 226 240 PLATELET CLUMPS NOTED ON SMEAR, COUNT APPEARS ADEQUATE    Basic Metabolic Panel: Recent Labs  Lab 06/16/24 1303 06/16/24 2006 06/17/24 0039 06/17/24 0531 06/18/24 0343 06/19/24 0328  NA 132*  --  129* 134* 130* 132*  K 2.8*  --  3.8 3.0* 4.5 3.7  CL 92*  --  93* 93* 91* 91*  CO2 18*  --  14* 20* 14* 21*  GLUCOSE 100*  --  151* 130* 125* 161*  BUN 107*  --  109* 106* 115* 80*  CREATININE 11.70*  --  12.10* 11.90* 15.00* 11.80*  CALCIUM  8.7*  --  8.4* 8.4* 8.8* 8.5*  MG  --  2.1  --   --   --   --   PHOS  --   --   --   --   --  5.2*    GFR: Estimated Creatinine Clearance: 5.5 mL/min (A) (by C-G formula based on SCr of 11.8 mg/dL (H)).  Liver Function Tests: Recent Labs  Lab 06/16/24 1303 06/18/24 0343 06/19/24 0328  AST 22 29  --   ALT <5 <5  --   ALKPHOS 54 66  --   BILITOT 0.3 0.3  --   PROT 7.2 6.8  --   ALBUMIN 3.9 3.4* 3.3*   Recent Labs  Lab 06/16/24 1303  LIPASE 30   No results for input(s): AMMONIA in the last 168 hours.  Coagulation Profile: No  results for input(s): INR, PROTIME in the last 168 hours.  Cardiac Enzymes: No results for input(s): CKTOTAL, CKMB, CKMBINDEX, TROPONINI in the last 168 hours.  BNP (last 3 results) No results for input(s): PROBNP in the last 8760 hours.  Lipid Profile: No results for input(s): CHOL, HDL, LDLCALC, TRIG, CHOLHDL, LDLDIRECT in the last 72 hours.  Thyroid  Function Tests: No results for input(s): TSH, T4TOTAL, FREET4, T3FREE, THYROIDAB in the last 72 hours.  Anemia Panel: No results for input(s): VITAMINB12, FOLATE, FERRITIN, TIBC, IRON, RETICCTPCT in the last 72 hours.  Urine analysis:    Component Value Date/Time   COLORURINE YELLOW 10/12/2023 0735   APPEARANCEUR HAZY (A) 10/12/2023 0735   LABSPEC 1.013 10/12/2023 0735   PHURINE 5.0 10/12/2023 0735   GLUCOSEU 150 (A) 10/12/2023 0735   HGBUR SMALL (A) 10/12/2023 0735   BILIRUBINUR NEGATIVE 10/12/2023 0735   KETONESUR NEGATIVE 10/12/2023 0735   PROTEINUR 100 (A) 10/12/2023 0735   NITRITE NEGATIVE 10/12/2023 0735   LEUKOCYTESUR SMALL (A) 10/12/2023 0735    Sepsis Labs: Lactic Acid, Venous    Component Value Date/Time   LATICACIDVEN 0.8 06/16/2024 2006    MICROBIOLOGY: Recent Results (from the past 240 hours)  Resp panel by RT-PCR (RSV, Flu A&B, Covid) Anterior Nasal Swab     Status: None   Collection Time: 06/16/24  1:04 PM   Specimen: Anterior Nasal Swab  Result Value Ref Range Status   SARS Coronavirus 2 by RT PCR NEGATIVE NEGATIVE Final    Comment: (NOTE) SARS-CoV-2 target nucleic acids are NOT DETECTED.  The SARS-CoV-2 RNA is generally detectable in upper respiratory specimens during the acute phase of infection. The lowest concentration of SARS-CoV-2 viral copies this assay can detect is 138 copies/mL. A negative result does not preclude SARS-Cov-2 infection and should not be used as the sole basis for treatment or other patient management decisions. A negative  result may  occur with  improper specimen collection/handling, submission of specimen other than nasopharyngeal swab, presence of viral mutation(s) within the areas targeted by this assay, and inadequate number of viral copies(<138 copies/mL). A negative result must be combined with clinical observations, patient history, and epidemiological information. The expected result is Negative.  Fact Sheet for Patients:  bloggercourse.com  Fact Sheet for Healthcare Providers:  seriousbroker.it  This test is no t yet approved or cleared by the [place]  FDA and  has been authorized for detection and/or diagnosis of SARS-CoV-2 by FDA under an Emergency Use Authorization (EUA). This EUA will remain  in effect (meaning this test can be used) for the duration of the COVID-19 declaration under Section 564(b)(1) of the Act, 21 U.S.C.section 360bbb-3(b)(1), unless the authorization is terminated  or revoked sooner.       Influenza A by PCR NEGATIVE NEGATIVE Final   Influenza B by PCR NEGATIVE NEGATIVE Final    Comment: (NOTE) The Xpert Xpress SARS-CoV-2/FLU/RSV plus assay is intended as an aid in the diagnosis of influenza from Nasopharyngeal swab specimens and should not be used as a sole basis for treatment. Nasal washings and aspirates are unacceptable for Xpert Xpress SARS-CoV-2/FLU/RSV testing.  Fact Sheet for Patients: bloggercourse.com  Fact Sheet for Healthcare Providers: seriousbroker.it  This test is not yet approved or cleared by the [place]  FDA and has been authorized for detection and/or diagnosis of SARS-CoV-2 by FDA under an Emergency Use Authorization (EUA). This EUA will remain in effect (meaning this test can be used) for the duration of the COVID-19 declaration under Section 564(b)(1) of the Act, 21 U.S.C. section 360bbb-3(b)(1), unless the authorization is  terminated or revoked.     Resp Syncytial Virus by PCR NEGATIVE NEGATIVE Final    Comment: (NOTE) Fact Sheet for Patients: bloggercourse.com  Fact Sheet for Healthcare Providers: seriousbroker.it  This test is not yet approved or cleared by the [place]  FDA and has been authorized for detection and/or diagnosis of SARS-CoV-2 by FDA under an Emergency Use Authorization (EUA). This EUA will remain in effect (meaning this test can be used) for the duration of the COVID-19 declaration under Section 564(b)(1) of the Act, 21 U.S.C. section 360bbb-3(b)(1), unless the authorization is terminated or revoked.  Performed at [facility], [address], [place], [place] [address]   Respiratory (~20 pathogens) panel by PCR     Status: None   Collection Time: 06/16/24  6:07 PM   Specimen: Nasopharyngeal Swab; Respiratory  Result Value Ref Range Status   Adenovirus NOT DETECTED NOT DETECTED Final   Coronavirus 229E NOT DETECTED NOT DETECTED Final    Comment: (NOTE) The Coronavirus on the Respiratory Panel, DOES NOT test for the novel  Coronavirus (2019 nCoV)    Coronavirus HKU1 NOT DETECTED NOT DETECTED Final   Coronavirus NL63 NOT DETECTED NOT DETECTED Final   Coronavirus OC43 NOT DETECTED NOT DETECTED Final   Metapneumovirus NOT DETECTED NOT DETECTED Final   Rhinovirus / Enterovirus NOT DETECTED NOT DETECTED Final   Influenza A NOT DETECTED NOT DETECTED Final   Influenza B NOT DETECTED NOT DETECTED Final   Parainfluenza Virus 1 NOT DETECTED NOT DETECTED Final   Parainfluenza Virus 2 NOT DETECTED NOT DETECTED Final   Parainfluenza Virus 3 NOT DETECTED NOT DETECTED Final   Parainfluenza Virus 4 NOT DETECTED NOT DETECTED Final   Respiratory Syncytial Virus NOT DETECTED NOT DETECTED Final   Bordetella pertussis NOT DETECTED NOT DETECTED Final   Bordetella Parapertussis NOT DETECTED NOT  DETECTED Final    Chlamydophila pneumoniae NOT DETECTED NOT DETECTED Final   Mycoplasma pneumoniae NOT DETECTED NOT DETECTED Final    Comment: Performed at [facility], [address] [place], [place] [address]  Blood culture (routine x 2)     Status: None (Preliminary result)   Collection Time: 06/16/24  6:14 PM   Specimen: BLOOD  Result Value Ref Range Status   Specimen Description   Final    BLOOD RIGHT ANTECUBITAL Performed at [facility], [address] [place], [place] [address]    Special Requests   Final    Blood Culture adequate volume Performed at [facility], [address] [place], [place] [address]    Culture   Final    NO GROWTH 3 DAYS Performed at [facility], [address] [place], [place] [address]    Report Status PENDING  Incomplete  Blood culture (routine x 2)     Status: None (Preliminary result)   Collection Time: 06/16/24  6:19 PM   Specimen: BLOOD  Result Value Ref Range Status   Specimen Description   Final    BLOOD BLOOD RIGHT ARM Performed at [facility], [address], [place], [place] [address]    Special Requests   Final    Blood Culture adequate volume Performed at [facility], [address], [place], [place] [address]    Culture   Final    NO GROWTH 3 DAYS Performed at [facility], [address], [place], [place] [address]    Report Status PENDING  Incomplete  Gastrointestinal Panel by PCR , Stool     Status: None   Collection Time: 06/16/24  7:04 PM   Specimen: Stool  Result Value Ref Range Status   Campylobacter species NOT DETECTED NOT DETECTED Final   Plesimonas shigelloides NOT DETECTED NOT DETECTED Final   Salmonella species NOT DETECTED NOT DETECTED Final   Yersinia enterocolitica NOT DETECTED NOT DETECTED Final   Vibrio species NOT DETECTED NOT DETECTED Final   Vibrio cholerae NOT DETECTED NOT DETECTED Final   Enteroaggregative E coli  (EAEC) NOT DETECTED NOT DETECTED Final   Enteropathogenic E coli (EPEC) NOT DETECTED NOT DETECTED Final   Enterotoxigenic E coli (ETEC) NOT DETECTED NOT DETECTED Final   Shiga like toxin producing E coli (STEC) NOT DETECTED NOT DETECTED Final   Shigella/Enteroinvasive E coli (EIEC) NOT DETECTED NOT DETECTED Final   Cryptosporidium NOT DETECTED NOT DETECTED Final   Cyclospora cayetanensis NOT DETECTED NOT DETECTED Final   Entamoeba histolytica NOT DETECTED NOT DETECTED Final   Giardia lamblia NOT DETECTED NOT DETECTED Final   Adenovirus F40/41 NOT DETECTED NOT DETECTED Final   Astrovirus NOT DETECTED NOT DETECTED Final   Norovirus GI/GII NOT DETECTED NOT DETECTED Final   Rotavirus A NOT DETECTED NOT DETECTED Final   Sapovirus (I, II, IV, and V) NOT DETECTED NOT DETECTED Final    Comment: Performed at [facility], [address] [place], [place] [address]  C Difficile Quick Screen w PCR reflex     Status: Abnormal   Collection Time: 06/16/24  7:04 PM   Specimen: Stool  Result Value Ref Range Status   C Diff antigen POSITIVE (A) NEGATIVE Final   C Diff toxin POSITIVE (A) NEGATIVE Final    Comment: CRITICAL RESULT CALLED TO, READ BACK BY AND VERIFIED WITH: RN R. MONTGOMERY [id number] @2304  FH    C Diff interpretation Toxin producing C. difficile  detected.  Final    Comment: Performed at [facility], [address], [place], [place] [address]    RADIOLOGY STUDIES/RESULTS: No results found.    LOS: 2 days   Donalda Applebaum, MD  [facility]    To contact the attending provider between 7A-7P or the covering provider during after hours 7P-7A, please log into the web site [email or web address] and access using universal [place] password for that web site. If you do not have the password, please call the hospital operator.  06/19/2024, 11:07 AM    "

## 2024-06-20 DIAGNOSIS — I5032 Chronic diastolic (congestive) heart failure: Secondary | ICD-10-CM | POA: Diagnosis not present

## 2024-06-20 DIAGNOSIS — E1142 Type 2 diabetes mellitus with diabetic polyneuropathy: Secondary | ICD-10-CM | POA: Diagnosis not present

## 2024-06-20 DIAGNOSIS — N186 End stage renal disease: Secondary | ICD-10-CM | POA: Diagnosis not present

## 2024-06-20 DIAGNOSIS — Z992 Dependence on renal dialysis: Secondary | ICD-10-CM | POA: Diagnosis not present

## 2024-06-20 DIAGNOSIS — A0472 Enterocolitis due to Clostridium difficile, not specified as recurrent: Secondary | ICD-10-CM | POA: Diagnosis not present

## 2024-06-20 LAB — GLUCOSE, CAPILLARY
Glucose-Capillary: 167 mg/dL — ABNORMAL HIGH (ref 70–99)
Glucose-Capillary: 200 mg/dL — ABNORMAL HIGH (ref 70–99)
Glucose-Capillary: 195 mg/dL — ABNORMAL HIGH (ref 70–99)

## 2024-06-20 LAB — CBC WITH DIFFERENTIAL/PLATELET
Abs Immature Granulocytes: 0.32 K/uL — ABNORMAL HIGH (ref 0.00–0.07)
Basophils Absolute: 0.1 K/uL (ref 0.0–0.1)
Basophils Relative: 0 %
Eosinophils Absolute: 0.2 K/uL (ref 0.0–0.5)
Eosinophils Relative: 1 %
HCT: 30.4 % — ABNORMAL LOW (ref 36.0–46.0)
Hemoglobin: 10.8 g/dL — ABNORMAL LOW (ref 12.0–15.0)
Immature Granulocytes: 1 %
Lymphocytes Relative: 2 %
Lymphs Abs: 0.5 K/uL — ABNORMAL LOW (ref 0.7–4.0)
MCH: 27.2 pg (ref 26.0–34.0)
MCHC: 35.5 g/dL (ref 30.0–36.0)
MCV: 76.6 fL — ABNORMAL LOW (ref 80.0–100.0)
Monocytes Absolute: 0.9 K/uL (ref 0.1–1.0)
Monocytes Relative: 4 %
Neutro Abs: 21.5 K/uL — ABNORMAL HIGH (ref 1.7–7.7)
Neutrophils Relative %: 92 %
Platelets: 176 K/uL (ref 150–400)
RBC: 3.97 MIL/uL (ref 3.87–5.11)
RDW: 16.9 % — ABNORMAL HIGH (ref 11.5–15.5)
WBC: 23.5 K/uL — ABNORMAL HIGH (ref 4.0–10.5)
nRBC: 0.1 % (ref 0.0–0.2)

## 2024-06-20 MED ORDER — LIDOCAINE-PRILOCAINE 2.5-2.5 % EX CREA: 1.0000 | TOPICAL_CREAM | CUTANEOUS | Status: DC | PRN

## 2024-06-20 MED ORDER — ALTEPLASE 2 MG IJ SOLR: 2.0000 mg | Freq: Once | INTRAMUSCULAR | Status: DC | PRN

## 2024-06-20 MED ORDER — ANTICOAGULANT SODIUM CITRATE 4% (200MG/5ML) IV SOLN: 5.0000 mL | Status: DC | PRN

## 2024-06-20 MED ORDER — PENTAFLUOROPROP-TETRAFLUOROETH EX AERO: 1.0000 | INHALATION_SPRAY | CUTANEOUS | Status: DC | PRN

## 2024-06-20 MED ORDER — HEPARIN SODIUM (PORCINE) 1000 UNIT/ML DIALYSIS: 1000.0000 [IU] | INTRAMUSCULAR | Status: DC | PRN

## 2024-06-20 MED ORDER — LIDOCAINE HCL (PF) 1 % IJ SOLN: 5.0000 mL | INTRAMUSCULAR | Status: DC | PRN

## 2024-06-20 MED ORDER — MENTHOL 3 MG MT LOZG
1.0000 | LOZENGE | OROMUCOSAL | Status: DC | PRN
  Administered 2024-06-20: 3 mg via ORAL
  Filled 2024-06-20: qty 9

## 2024-06-20 NOTE — Progress Notes (Signed)
 Received patient in bed to unit.  Alert and oriented.  Informed consent signed and in chart.   TX duration:3.5 hours  Patient tolerated well.  Transported back to the room  Alert, without acute distress.  Hand-off given to patient's nurse.   Access used: Left upper arm Fistula Access issues: none  Total UF removed: 0mL   06/20/24 1830  Vitals  Temp 97.9 F (36.6 C)  Temp Source Oral  BP 134/68  MAP (mmHg) 86  Pulse Rate 70  ECG Heart Rate 71  Resp 12  Oxygen  Therapy  SpO2 98 %  O2 Device Room Air  During Treatment Monitoring  Dialysate Potassium Concentration 3  Dialysate Calcium  Concentration 2.5  Duration of HD Treatment -hour(s) 3.5 hour(s)  HD Safety Checks Performed Yes  Intra-Hemodialysis Comments Tx completed  Post Treatment  Dialyzer Clearance Lightly streaked  Liters Processed 84  Fluid Removed (mL) 0 mL  Tolerated HD Treatment Yes  Post-Hemodialysis Comments tolerated well  AVG/AVF Arterial Site Held (minutes) 7 minutes  AVG/AVF Venous Site Held (minutes) 7 minutes  Fistula / Graft Left Upper arm Arteriovenous fistula  Placement Date/Time: 12/09/21 1058   Placed prior to admission: No  Orientation: Left  Access Location: Upper arm  Access Type: Arteriovenous fistula  Site Condition No complications  Fistula / Graft Assessment Present;Thrill;Bruit  Status Patent;Deaccessed  Drainage Description None     Camellia Brasil LPN [facility]

## 2024-06-20 NOTE — Plan of Care (Signed)

## 2024-06-20 NOTE — Evaluation (Signed)
 Physical Therapy Evaluation and DISCHARGE Patient Details Name: Maria Hudson MRN: [id number] DOB: May 13, 1961 Today's Date: 06/20/2024  History of Present Illness  64 yo F adm 06/16/24 with N/V, diarrhea, Cdiff colitis. PMhx: CAD, HTN, hearing loss, retinopathy, DM, ESRD on home HD [facility] transitioning to PD.  Clinical Impression  Pt is currently mobilizing at her baseline with mild generalized weakness and lightheadedness due to recent N/V/D. Pt observed independently ambulating to bathroom prior to arrival. Pt toileting upon arrival and is able to complete peri care independently. Pt demonstrates independent ambulation in room with good balance and no use of DME. Pt demonstrates some difficulty balancing with narrow BOS. Pt tolerates romberg stance well and requires light UE support during tandem stance. Pt has appropriate equipment at home to assist with balance and was educated on benefits of use. Pt has good support at home. Pt hopes to attend OP PT once dialysis schedule is finalized due to recent transition to PD. Pt with no PT concerns while in house. PT to sign off, re-consult as needed if mobility needs change.         If plan is discharge home, recommend the following: A little help with walking and/or transfers;Assistance with cooking/housework;Assist for transportation;Help with stairs or ramp for entrance   Can travel by private vehicle        Equipment Recommendations BSC/3in1  Recommendations for Other Services       Functional Status Assessment Patient has not had a recent decline in their functional status     Precautions / Restrictions Precautions Precautions: Fall Recall of Precautions/Restrictions: Intact Precaution/Restrictions Comments: Enteric: C-diff Restrictions Weight Bearing Restrictions Per Provider Order: No      Mobility  Bed Mobility               General bed mobility comments: Pt in bathroom upon arrival, ended session in recliner. No bed  mobility observed.    Transfers Overall transfer level: Independent Equipment used: None               General transfer comment: Pt steady upon standing, reports feeling slightly lightheaded but improves with time. Good eccentric control upon returning to sit.    Ambulation/Gait Ambulation/Gait assistance: Independent Gait Distance (Feet): 30 Feet Assistive device: None Gait Pattern/deviations: Step-through pattern, Decreased step length - right, Decreased step length - left, Drifts right/left   Gait velocity interpretation: 1.31 - 2.62 ft/sec, indicative of limited community ambulator   General Gait Details: Pt generally steady during short distance ambulation. Does not require DME for safe mobility at this time but does have available at home.  Stairs            Wheelchair Mobility     Tilt Bed    Modified Rankin (Stroke Patients Only)       Balance Overall balance assessment: Needs assistance Sitting-balance support: No upper extremity supported Sitting balance-Leahy Scale: Normal Sitting balance - Comments: No sitting balance concerns   Standing balance support: Single extremity supported Standing balance-Leahy Scale: Good Standing balance comment: Pt with good static balance in anatomic stance. Varying degrees of narrow BOS assessed. Pt requires light single UE support during tandem stance tasks due to inability to maintain balance. Does well with romberg stance. Steady throughout functional tasks observed in room.     Tandem Stance - Right Leg: 15 Tandem Stance - Left Leg: 10 Rhomberg - Eyes Opened: 30 Rhomberg - Eyes Closed: 30  Pertinent Vitals/Pain Pain Assessment Pain Assessment: No/denies pain    Home Living Family/patient expects to be discharged to:: Private residence Living Arrangements: Children (Adult daughter, son, grandchildren) Available Help at Discharge: Family;Available 24 hours/day Type of Home: House        Alternate Level Stairs-Number of Steps: 14 Home Layout: Two level;Able to live on main level with bedroom/bathroom (Laundry on 2nd floor) Home Equipment: Rollator (4 wheels);Cane - single point;Shower seat      Prior Function Prior Level of Function : Independent/Modified Independent;Needs assist       Physical Assist : ADLs (physical)   ADLs (physical): IADLs Mobility Comments: Pt modI at baseline, has equipment available but does not always use. States use is typically need dependent. ADLs Comments: Requires assist for household tasks. Family takes laundry to 2nd floor, due to pt requiring B UE to ascend/descend stairs.     Extremity/Trunk Assessment   Upper Extremity Assessment Upper Extremity Assessment: Defer to OT evaluation    Lower Extremity Assessment Lower Extremity Assessment: Overall WFL for tasks assessed;Generalized weakness    Cervical / Trunk Assessment Cervical / Trunk Assessment: Normal  Communication   Communication Communication: No apparent difficulties    Cognition Arousal: Alert Behavior During Therapy: WFL for tasks assessed/performed   PT - Cognitive impairments: No apparent impairments                         Following commands: Intact       Cueing Cueing Techniques: Verbal cues, Visual cues     General Comments General comments (skin integrity, edema, etc.): VSS throughout. No significant skin abnormalities noted.    Exercises     Assessment/Plan    PT Assessment All further PT needs can be met in the next venue of care  PT Problem List Decreased strength;Decreased activity tolerance;Decreased balance;Decreased knowledge of use of DME;Decreased skin integrity       PT Treatment Interventions      PT Goals (Current goals can be found in the Care Plan section)  Acute Rehab PT Goals Patient Stated Goal: None stated    Frequency       Co-evaluation               AM-PAC PT 6 Clicks Mobility  Outcome  Measure Help needed turning from your back to your side while in a flat bed without using bedrails?: None Help needed moving from lying on your back to sitting on the side of a flat bed without using bedrails?: None Help needed moving to and from a bed to a chair (including a wheelchair)?: None Help needed standing up from a chair using your arms (e.g., wheelchair or bedside chair)?: None Help needed to walk in hospital room?: None Help needed climbing 3-5 steps with a railing? : None 6 Click Score: 24    End of Session   Activity Tolerance: Patient tolerated treatment well Patient left: in chair;with call bell/phone within reach Nurse Communication: Mobility status PT Visit Diagnosis: Unsteadiness on feet (R26.81);Muscle weakness (generalized) (M62.81)    Time: 9086-9069 PT Time Calculation (min) (ACUTE ONLY): 17 min   Charges:   PT Evaluation $PT Eval Low Complexity: 1 Low   PT General Charges $$ ACUTE PT VISIT: 1 Visit         Sabra Morel, PT, DPT  Acute Rehabilitation Services         Office: [phone]     Sabra MARLA Morel 06/20/2024, 11:47 AM

## 2024-06-20 NOTE — Progress Notes (Addendum)
" °   06/20/24 1602  Peritoneal Catheter Left lower abdomen Continuous ambulatory  Placement Date/Time: 06/02/24 1338   Serial / Lot #: [id number]  Expiration Date: 09/11/28  Procedural Verification: Medical records & consent reviewed  Time out: Correct Patient;Correct Procedure;Special equipment/requirements available;Correct Site  Per...  Site Assessment Clean, Dry, Intact  Drainage Description None  Catheter status Patent;Other (Comment) (flushed)  Dressing Gauze/Drain sponge  Dressing Status Clean, Dry, Intact  Dressing Intervention New dressing/dressing changed   Flushed PD cath per order from PA Stovall during dialysis.  in and out, fluid out was Yellow and clear.  Camellia Brasil, LPN-KDU "

## 2024-06-20 NOTE — Progress Notes (Signed)
 "                        PROGRESS NOTE        PATIENT DETAILS Name: Maria Hudson Age: 64 y.o. Sex: female Date of Birth: 01/23/1961 Admit Date: 06/16/2024 Admitting Physician Camellia Door, DO ERE:Jcalzmz, Aliene, MD  Brief Summary: Patient is a 64 y.o.  female with history of ESRD on HD MWF-presented with nausea, vomiting, diarrhea, subjective fever, 1 episode of syncope (3 days prior to hospitalization)-found to have C. difficile colitis.    Significant events: 1/3>> admit to [facility]  Significant studies: 1/2>> chest x-ray: Central vascular congestion. 1/2>> x-ray left shoulder: No fracture 1/2>> CT abdomen/pelvis: Mild wall thickening of transverse/descending/sigmoid colon. 1/2>> CT head: No acute intracranial abnormality.  Significant microbiology data: 1/2>> COVID/influenza/RSV PCR: Negative 1/2>> stool C. difficile antigen/toxin: Positive 1/2>> GI pathogen panel: Not detected 1/2>> respiratory virus panel: Negative 1/2>> blood culture: Negative  Procedures: None  Consults: None  Subjective: Diarrhea continues to slow down-less in volume but still going to the bathroom-multiple times a day.  Getting more formed as well.  Objective: Vitals: Blood pressure (!) 109/55, pulse 72, temperature 98.6 F (37 C), temperature source Oral, resp. rate 17, height 5' 3 (1.6 m), weight 97.5 kg, SpO2 93%.   Exam: Gen Exam:Alert awake-not in any distress HEENT:atraumatic, normocephalic Chest: B/L clear to auscultation anteriorly CVS:S1S2 regular Abdomen:soft non tender, non distended Extremities:no edema Neurology: Non focal Skin: no rash  Pertinent Labs/Radiology:    Latest Ref Rng & Units 06/20/2024    2:43 AM 06/19/2024    3:28 AM 06/18/2024    3:43 AM  CBC  WBC 4.0 - 10.5 K/uL 23.5  30.0  22.8   Hemoglobin 12.0 - 15.0 g/dL 89.1  88.5  87.7   Hematocrit 36.0 - 46.0 % 30.4  32.5  35.8   Platelets 150 - 400 K/uL 176  PLATELET CLUMPS NOTED ON SMEAR, COUNT APPEARS ADEQUATE  240      Lab Results  Component Value Date   NA 132 (L) 06/19/2024   K 3.7 06/19/2024   CL 91 (L) 06/19/2024   CO2 21 (L) 06/19/2024      Assessment/Plan: C. difficile colitis Denies any prior antibiotic exposure in the past 3 months Diet regular improving-stools getting less frequent-less voluminous-and more solid-appearing Leukocytosis also downtrending Continue Dificid  Will watch for another 24 hours-if stable-possibly home tomorrow morning.   Syncope Occurred 3 days prior to presentation Either vasovagal (defecation related) or orthostatic Mobilize with PT/OT Continue telemetry monitoring Echo with stable EF.  ESRD on HD MTWF (home hemodialysis) Nephrology following Recently had PD catheter placed for anticipation of initiation of peritoneal dialysis.  Discussed with nephrology-they will place routine PD catheter care orders.  Hyponatremia Mild-asymptomatic Likely secondary to impaired free water  excretion Should improve with HD. Follow electrolytes periodically.  Chronic HFpEF Euvolemic Volume removal with HD  CAD No anginal symptoms currently Aspirin /statin/beta-blocker  HTN BP stable Coreg /Imdur   DM-2 (A1c 6.3 on 1/2) CBG stable SSI while inpatient, resume usual outpatient regimen on discharge.  Recent Labs    06/19/24 1606 06/19/24 2039 06/20/24 0733  GLUCAP 163* 199* 167*     HLD Statin/Zetia   Peripheral neuropathy Likely related to DM-2 Lyrica  As needed Ultram   Mood disorder Stable Continue Wellbutrin /trazodone   OSA CPAP nightly  [employer]-refuses blood products  Class 2 Obesity Estimated body mass index is 38.09 kg/m as calculated from the following:   Height as of  this encounter: 5' 3 (1.6 m).   Weight as of this encounter: 97.5 kg.   Code status:   Code Status: Full Code   DVT Prophylaxis: Place TED hose Start: 06/18/24 0829 heparin  injection 5,000 Units Start: 06/17/24 1400 SCDs Start: 06/17/24 1314   Family  Communication: None at bedside   Disposition Plan: Status is: Inpatient Remains inpatient appropriate because: Severity of illness   Planned Discharge Destination:Home   Diet: Diet Order             Diet renal/carb modified with fluid restriction Diet-HS Snack? Nothing; Fluid restriction: 2000 mL Fluid; Room service appropriate? Yes; Fluid consistency: Thin  Diet effective now                     Antimicrobial agents: Anti-infectives (From admission, onward)    Start     Dose/Rate Route Frequency Ordered Stop   06/17/24 2015  ciprofloxacin  (CIPRO ) IVPB 400 mg  Status:  Discontinued        400 mg 200 mL/hr over 60 Minutes Intravenous Every 24 hours 06/16/24 2015 06/16/24 2334   06/17/24 0800  metroNIDAZOLE  (FLAGYL ) IVPB 500 mg  Status:  Discontinued        500 mg 100 mL/hr over 60 Minutes Intravenous Every 12 hours 06/16/24 2027 06/16/24 2334   06/17/24 0200  fidaxomicin  (DIFICID ) tablet 200 mg        200 mg Oral 2 times daily 06/16/24 2305 06/26/24 2159   06/17/24 0000  cefTRIAXone  (ROCEPHIN ) 2 g in sodium chloride  0.9 % 100 mL IVPB  Status:  Discontinued        2 g 200 mL/hr over 30 Minutes Intravenous Every 24 hours 06/16/24 2027 06/16/24 2334   06/17/24 0000  metroNIDAZOLE  (FLAGYL ) IVPB 500 mg  Status:  Discontinued        500 mg 100 mL/hr over 60 Minutes Intravenous Every 12 hours 06/16/24 2027 06/16/24 2027   06/16/24 2015  ciprofloxacin  (CIPRO ) IVPB 400 mg  Status:  Discontinued        400 mg 200 mL/hr over 60 Minutes Intravenous Every 12 hours 06/16/24 2000 06/16/24 2015   06/16/24 2015  metroNIDAZOLE  (FLAGYL ) IVPB 500 mg  Status:  Discontinued        500 mg 100 mL/hr over 60 Minutes Intravenous Every 12 hours 06/16/24 2000 06/16/24 2051        MEDICATIONS: Scheduled Meds:  aspirin  EC  81 mg Oral Daily   atorvastatin   80 mg Oral QPM   buPROPion   100 mg Oral BID   carvedilol   3.125 mg Oral BID   Chlorhexidine  Gluconate Cloth  6 each Topical Q0600    ezetimibe   10 mg Oral Daily   fidaxomicin   200 mg Oral BID   heparin   5,000 Units Subcutaneous Q8H   insulin  aspart  0-5 Units Subcutaneous QHS   insulin  aspart  0-6 Units Subcutaneous TID WC   isosorbide  mononitrate  120 mg Oral QHS   pregabalin   25 mg Oral Daily   saccharomyces boulardii  250 mg Oral BID   traZODone   50 mg Oral QHS   Continuous Infusions:  anticoagulant sodium citrate      PRN Meds:.acetaminophen , alteplase , anticoagulant sodium citrate , heparin , lidocaine  (PF), lidocaine -prilocaine , menthol , ondansetron  **OR** ondansetron  (ZOFRAN ) IV, pentafluoroprop-tetrafluoroeth, traMADol    I have personally reviewed following labs and imaging studies  LABORATORY DATA: CBC: Recent Labs  Lab 06/16/24 1303 06/17/24 0531 06/18/24 0343 06/19/24 0328 06/20/24 0243  WBC 13.5* 13.2* 22.8* 30.0* 23.5*  NEUTROABS  --   --  20.7* 27.9* 21.5*  HGB 11.4* 12.2 12.2 11.4* 10.8*  HCT 33.4* 36.2 35.8* 32.5* 30.4*  MCV 79.5* 79.4* 79.9* 77.2* 76.6*  PLT 239 226 240 PLATELET CLUMPS NOTED ON SMEAR, COUNT APPEARS ADEQUATE 176    Basic Metabolic Panel: Recent Labs  Lab 06/16/24 1303 06/16/24 2006 06/17/24 0039 06/17/24 0531 06/18/24 0343 06/19/24 0328  NA 132*  --  129* 134* 130* 132*  K 2.8*  --  3.8 3.0* 4.5 3.7  CL 92*  --  93* 93* 91* 91*  CO2 18*  --  14* 20* 14* 21*  GLUCOSE 100*  --  151* 130* 125* 161*  BUN 107*  --  109* 106* 115* 80*  CREATININE 11.70*  --  12.10* 11.90* 15.00* 11.80*  CALCIUM  8.7*  --  8.4* 8.4* 8.8* 8.5*  MG  --  2.1  --   --   --   --   PHOS  --   --   --   --   --  5.2*    GFR: Estimated Creatinine Clearance: 5.5 mL/min (A) (by C-G formula based on SCr of 11.8 mg/dL (H)).  Liver Function Tests: Recent Labs  Lab 06/16/24 1303 06/18/24 0343 06/19/24 0328  AST 22 29  --   ALT <5 <5  --   ALKPHOS 54 66  --   BILITOT 0.3 0.3  --   PROT 7.2 6.8  --   ALBUMIN 3.9 3.4* 3.3*   Recent Labs  Lab 06/16/24 1303  LIPASE 30   No results for  input(s): AMMONIA in the last 168 hours.  Coagulation Profile: No results for input(s): INR, PROTIME in the last 168 hours.  Cardiac Enzymes: No results for input(s): CKTOTAL, CKMB, CKMBINDEX, TROPONINI in the last 168 hours.  BNP (last 3 results) No results for input(s): PROBNP in the last 8760 hours.  Lipid Profile: No results for input(s): CHOL, HDL, LDLCALC, TRIG, CHOLHDL, LDLDIRECT in the last 72 hours.  Thyroid  Function Tests: No results for input(s): TSH, T4TOTAL, FREET4, T3FREE, THYROIDAB in the last 72 hours.  Anemia Panel: No results for input(s): VITAMINB12, FOLATE, FERRITIN, TIBC, IRON, RETICCTPCT in the last 72 hours.  Urine analysis:    Component Value Date/Time   COLORURINE YELLOW 10/12/2023 0735   APPEARANCEUR HAZY (A) 10/12/2023 0735   LABSPEC 1.013 10/12/2023 0735   PHURINE 5.0 10/12/2023 0735   GLUCOSEU 150 (A) 10/12/2023 0735   HGBUR SMALL (A) 10/12/2023 0735   BILIRUBINUR NEGATIVE 10/12/2023 0735   KETONESUR NEGATIVE 10/12/2023 0735   PROTEINUR 100 (A) 10/12/2023 0735   NITRITE NEGATIVE 10/12/2023 0735   LEUKOCYTESUR SMALL (A) 10/12/2023 0735    Sepsis Labs: Lactic Acid, Venous    Component Value Date/Time   LATICACIDVEN 0.8 06/16/2024 2006    MICROBIOLOGY: Recent Results (from the past 240 hours)  Resp panel by RT-PCR (RSV, Flu A&B, Covid) Anterior Nasal Swab     Status: None   Collection Time: 06/16/24  1:04 PM   Specimen: Anterior Nasal Swab  Result Value Ref Range Status   SARS Coronavirus 2 by RT PCR NEGATIVE NEGATIVE Final    Comment: (NOTE) SARS-CoV-2 target nucleic acids are NOT DETECTED.  The SARS-CoV-2 RNA is generally detectable in upper respiratory specimens during the acute phase of infection. The lowest concentration of SARS-CoV-2 viral copies this assay can detect is 138 copies/mL. A negative result does not preclude SARS-Cov-2 infection and should not be used as the sole  basis for treatment or other patient management decisions. A negative result may occur with  improper specimen collection/handling, submission of specimen other than nasopharyngeal swab, presence of viral mutation(s) within the areas targeted by this assay, and inadequate number of viral copies(<138 copies/mL). A negative result must be combined with clinical observations, patient history, and epidemiological information. The expected result is Negative.  Fact Sheet for Patients:  bloggercourse.com  Fact Sheet for Healthcare Providers:  seriousbroker.it  This test is no t yet approved or cleared by the [place]  FDA and  has been authorized for detection and/or diagnosis of SARS-CoV-2 by FDA under an Emergency Use Authorization (EUA). This EUA will remain  in effect (meaning this test can be used) for the duration of the COVID-19 declaration under Section 564(b)(1) of the Act, 21 U.S.C.section 360bbb-3(b)(1), unless the authorization is terminated  or revoked sooner.       Influenza A by PCR NEGATIVE NEGATIVE Final   Influenza B by PCR NEGATIVE NEGATIVE Final    Comment: (NOTE) The Xpert Xpress SARS-CoV-2/FLU/RSV plus assay is intended as an aid in the diagnosis of influenza from Nasopharyngeal swab specimens and should not be used as a sole basis for treatment. Nasal washings and aspirates are unacceptable for Xpert Xpress SARS-CoV-2/FLU/RSV testing.  Fact Sheet for Patients: bloggercourse.com  Fact Sheet for Healthcare Providers: seriousbroker.it  This test is not yet approved or cleared by the [place]  FDA and has been authorized for detection and/or diagnosis of SARS-CoV-2 by FDA under an Emergency Use Authorization (EUA). This EUA will remain in effect (meaning this test can be used) for the duration of the COVID-19 declaration under Section 564(b)(1) of the Act,  21 U.S.C. section 360bbb-3(b)(1), unless the authorization is terminated or revoked.     Resp Syncytial Virus by PCR NEGATIVE NEGATIVE Final    Comment: (NOTE) Fact Sheet for Patients: bloggercourse.com  Fact Sheet for Healthcare Providers: seriousbroker.it  This test is not yet approved or cleared by the [place]  FDA and has been authorized for detection and/or diagnosis of SARS-CoV-2 by FDA under an Emergency Use Authorization (EUA). This EUA will remain in effect (meaning this test can be used) for the duration of the COVID-19 declaration under Section 564(b)(1) of the Act, 21 U.S.C. section 360bbb-3(b)(1), unless the authorization is terminated or revoked.  Performed at [facility], [address], [place], [place] [address]   Respiratory (~20 pathogens) panel by PCR     Status: None   Collection Time: 06/16/24  6:07 PM   Specimen: Nasopharyngeal Swab; Respiratory  Result Value Ref Range Status   Adenovirus NOT DETECTED NOT DETECTED Final   Coronavirus 229E NOT DETECTED NOT DETECTED Final    Comment: (NOTE) The Coronavirus on the Respiratory Panel, DOES NOT test for the novel  Coronavirus (2019 nCoV)    Coronavirus HKU1 NOT DETECTED NOT DETECTED Final   Coronavirus NL63 NOT DETECTED NOT DETECTED Final   Coronavirus OC43 NOT DETECTED NOT DETECTED Final   Metapneumovirus NOT DETECTED NOT DETECTED Final   Rhinovirus / Enterovirus NOT DETECTED NOT DETECTED Final   Influenza A NOT DETECTED NOT DETECTED Final   Influenza B NOT DETECTED NOT DETECTED Final   Parainfluenza Virus 1 NOT DETECTED NOT DETECTED Final   Parainfluenza Virus 2 NOT DETECTED NOT DETECTED Final   Parainfluenza Virus 3 NOT DETECTED NOT DETECTED Final   Parainfluenza Virus 4 NOT DETECTED NOT DETECTED Final   Respiratory Syncytial Virus NOT DETECTED NOT DETECTED Final   Bordetella pertussis  NOT DETECTED NOT DETECTED Final    Bordetella Parapertussis NOT DETECTED NOT DETECTED Final   Chlamydophila pneumoniae NOT DETECTED NOT DETECTED Final   Mycoplasma pneumoniae NOT DETECTED NOT DETECTED Final    Comment: Performed at [facility], [address] [place], [place] [address]  Blood culture (routine x 2)     Status: None (Preliminary result)   Collection Time: 06/16/24  6:14 PM   Specimen: BLOOD  Result Value Ref Range Status   Specimen Description   Final    BLOOD RIGHT ANTECUBITAL Performed at [facility], [address], [place], [place] [address]    Special Requests   Final    Blood Culture adequate volume Performed at [facility], [address] [place], [place] [address]    Culture   Final    NO GROWTH 4 DAYS Performed at [facility], [address] [place], [place] [address]    Report Status PENDING  Incomplete  Blood culture (routine x 2)     Status: None (Preliminary result)   Collection Time: 06/16/24  6:19 PM   Specimen: BLOOD  Result Value Ref Range Status   Specimen Description   Final    BLOOD BLOOD RIGHT ARM Performed at [facility], [address], [place], [place] [address]    Special Requests   Final    Blood Culture adequate volume Performed at [facility], [address] [place], [place] [address]    Culture   Final    NO GROWTH 4 DAYS Performed at [facility], [address] [place], [place] [address]    Report Status PENDING  Incomplete  Gastrointestinal Panel by PCR , Stool     Status: None   Collection Time: 06/16/24  7:04 PM   Specimen: Stool  Result Value Ref Range Status   Campylobacter species NOT DETECTED NOT DETECTED Final   Plesimonas shigelloides NOT DETECTED NOT DETECTED Final   Salmonella species NOT DETECTED NOT DETECTED Final   Yersinia enterocolitica NOT DETECTED NOT DETECTED Final   Vibrio species NOT DETECTED NOT DETECTED Final   Vibrio cholerae  NOT DETECTED NOT DETECTED Final   Enteroaggregative E coli (EAEC) NOT DETECTED NOT DETECTED Final   Enteropathogenic E coli (EPEC) NOT DETECTED NOT DETECTED Final   Enterotoxigenic E coli (ETEC) NOT DETECTED NOT DETECTED Final   Shiga like toxin producing E coli (STEC) NOT DETECTED NOT DETECTED Final   Shigella/Enteroinvasive E coli (EIEC) NOT DETECTED NOT DETECTED Final   Cryptosporidium NOT DETECTED NOT DETECTED Final   Cyclospora cayetanensis NOT DETECTED NOT DETECTED Final   Entamoeba histolytica NOT DETECTED NOT DETECTED Final   Giardia lamblia NOT DETECTED NOT DETECTED Final   Adenovirus F40/41 NOT DETECTED NOT DETECTED Final   Astrovirus NOT DETECTED NOT DETECTED Final   Norovirus GI/GII NOT DETECTED NOT DETECTED Final   Rotavirus A NOT DETECTED NOT DETECTED Final   Sapovirus (I, II, IV, and V) NOT DETECTED NOT DETECTED Final    Comment: Performed at [facility], [address] [place], [place] [address]  C Difficile Quick Screen w PCR reflex     Status: Abnormal   Collection Time: 06/16/24  7:04 PM   Specimen: Stool  Result Value Ref Range Status   C Diff antigen POSITIVE (A) NEGATIVE Final   C Diff toxin POSITIVE (A) NEGATIVE Final    Comment: CRITICAL RESULT CALLED TO, READ BACK BY AND VERIFIED WITH: RN R. MONTGOMERY [phone] @  2304 FH    C Diff interpretation Toxin producing C. difficile detected.  Final    Comment: Performed at [facility], [address] [place], [place] [address]    RADIOLOGY STUDIES/RESULTS: ECHOCARDIOGRAM COMPLETE Result Date: 06/19/2024    ECHOCARDIOGRAM REPORT   Patient Name:   Maria Hudson Date of Exam: 06/19/2024 Medical Rec #:  [id number]        Height:       63.0 in Accession #:    [id number]       Weight:       215.0 lb Date of Birth:  12-Jun-1961        BSA:          1.994 m Patient Age:    63 years         BP:           114/73 mmHg Patient Gender: F                HR:           96 bpm. Exam Location:  Inpatient Procedure: 2D  Echo, Cardiac Doppler, Color Doppler and Strain Analysis (Both            Spectral and Color Flow Doppler were utilized during procedure). Indications:    R55 Syncope  History:        Patient has prior history of Echocardiogram examinations, most                 recent 10/28/2022. CHF, CAD; Risk Factors:Hypertension,                 Dyslipidemia, Diabetes and Sleep Apnea. ESRD.  Sonographer:    Ellouise Mose RDCS Referring Phys: [id number] [facility]  Sonographer Comments: Technically difficult study due to poor echo windows. Image acquisition challenging due to patient body habitus. IMPRESSIONS  1. Left ventricular ejection fraction, by estimation, is 60 to 65%. The left ventricle has normal function. The left ventricle has no regional wall motion abnormalities. There is mild asymmetric left ventricular hypertrophy of the basal-septal segment. Left ventricular diastolic parameters are consistent with Grade I diastolic dysfunction (impaired relaxation).  2. Right ventricular systolic function is normal. The right ventricular size is normal. Tricuspid regurgitation signal is inadequate for assessing PA pressure.  3. The mitral valve is normal in structure. No evidence of mitral valve regurgitation. No evidence of mitral stenosis.  4. The aortic valve is tricuspid. There is mild calcification of the aortic valve. Aortic valve regurgitation is not visualized. Aortic valve sclerosis is present, with no evidence of aortic valve stenosis.  5. The inferior vena cava is normal in size with greater than 50% respiratory variability, suggesting right atrial pressure of 3 mmHg. FINDINGS  Left Ventricle: Left ventricular ejection fraction, by estimation, is 60 to 65%. The left ventricle has normal function. The left ventricle has no regional wall motion abnormalities. The left ventricular internal cavity size was normal in size. There is  mild asymmetric left ventricular hypertrophy of the basal-septal segment. Left ventricular  diastolic parameters are consistent with Grade I diastolic dysfunction (impaired relaxation). Right Ventricle: The right ventricular size is normal. No increase in right ventricular wall thickness. Right ventricular systolic function is normal. Tricuspid regurgitation signal is inadequate for assessing PA pressure. Left Atrium: Left atrial size was normal in size. Right Atrium: Right atrial size was normal in size. Pericardium: There is no evidence of pericardial effusion. Mitral Valve: The mitral valve is normal  in structure. No evidence of mitral valve regurgitation. No evidence of mitral valve stenosis. Tricuspid Valve: The tricuspid valve is normal in structure. Tricuspid valve regurgitation is not demonstrated. No evidence of tricuspid stenosis. Aortic Valve: The aortic valve is tricuspid. There is mild calcification of the aortic valve. Aortic valve regurgitation is not visualized. Aortic valve sclerosis is present, with no evidence of aortic valve stenosis. Pulmonic Valve: The pulmonic valve was normal in structure. Pulmonic valve regurgitation is not visualized. No evidence of pulmonic stenosis. Aorta: The aortic root is normal in size and structure. Venous: The inferior vena cava is normal in size with greater than 50% respiratory variability, suggesting right atrial pressure of 3 mmHg. IAS/Shunts: No atrial level shunt detected by color flow Doppler.  LEFT VENTRICLE PLAX 2D LVIDd:         4.35 cm     Diastology LVIDs:         2.93 cm     LV e' medial:    6.31 cm/s LV PW:         1.60 cm     LV E/e' medial:  10.9 LV IVS:        1.30 cm     LV e' lateral:   9.90 cm/s LVOT diam:     1.90 cm     LV E/e' lateral: 6.9 LV SV:         48 LV SV Index:   24 LVOT Area:     2.84 cm LV IVRT:       109 msec  LV Volumes (MOD) LV vol d, MOD A2C: 58.3 ml LV vol d, MOD A4C: 59.2 ml LV vol s, MOD A2C: 24.7 ml LV vol s, MOD A4C: 20.4 ml LV SV MOD A2C:     33.6 ml LV SV MOD A4C:     59.2 ml LV SV MOD BP:      36.4 ml RIGHT  VENTRICLE             IVC RV S prime:     12.00 cm/s  IVC diam: 1.50 cm TAPSE (M-mode): 1.4 cm                             PULMONARY VEINS                             Diastolic Velocity: 34.30 cm/s                             S/D Velocity:       1.60                             Systolic Velocity:  55.50 cm/s LEFT ATRIUM           Index        RIGHT ATRIUM          Index LA diam:      3.10 cm 1.55 cm/m   RA Area:     7.27 cm LA Vol (A2C): 20.1 ml 10.08 ml/m  RA Volume:   10.20 ml 5.12 ml/m LA Vol (A4C): 55.9 ml 28.03 ml/m  AORTIC VALVE LVOT Vmax:   108.00 cm/s LVOT Vmean:  73.900 cm/s LVOT VTI:    0.171 m  AORTA Ao Root diam: 3.00 cm  Ao Asc diam:  3.60 cm MITRAL VALVE MV Area (PHT): 4.68 cm     SHUNTS MV Decel Time: 162 msec     Systemic VTI:  0.17 m MV E velocity: 68.70 cm/s   Systemic Diam: 1.90 cm MV A velocity: 128.00 cm/s MV E/A ratio:  0.54 Oneil Parchment MD Electronically signed by Oneil Parchment MD Signature Date/Time: 06/19/2024/2:43:24 PM    Final       LOS: 3 days   Donalda Applebaum, MD  [facility]    To contact the attending provider between 7A-7P or the covering provider during after hours 7P-7A, please log into the web site [email or web address] and access using universal [place] password for that web site. If you do not have the password, please call the hospital operator.  06/20/2024, 11:07 AM    "

## 2024-06-20 NOTE — Progress Notes (Signed)
 " [facility] Progress Note   Subjective:   Seen in room - sitting in recliner. Starting to feel a little better. No CP/dyspnea.  Objective Vitals:   06/19/24 2158 06/20/24 0213 06/20/24 0737 06/20/24 1155  BP: (!) 110/53 (!) 123/53 (!) 109/55 138/79  Pulse: 78 74 72 79  Resp:  17    Temp:  98.9 F (37.2 C) 98.6 F (37 C) 98.7 F (37.1 C)  TempSrc:  Oral Oral Oral  SpO2:  94% 93% 92%  Weight:      Height:       Physical Exam General: Ill appearing woman, NAD. Room air Heart: RRR Lungs: CTAB Abdomen: mild generalized tenderness without guarding, PD cath in place Extremities: no LE edema Dialysis Access: LUE AVF + PD cath  Additional Objective Labs: Basic Metabolic Panel: Recent Labs  Lab 06/17/24 0531 06/18/24 0343 06/19/24 0328  NA 134* 130* 132*  K 3.0* 4.5 3.7  CL 93* 91* 91*  CO2 20* 14* 21*  GLUCOSE 130* 125* 161*  BUN 106* 115* 80*  CREATININE 11.90* 15.00* 11.80*  CALCIUM  8.4* 8.8* 8.5*  PHOS  --   --  5.2*   Liver Function Tests: Recent Labs  Lab 06/16/24 1303 06/18/24 0343 06/19/24 0328  AST 22 29  --   ALT <5 <5  --   ALKPHOS 54 66  --   BILITOT 0.3 0.3  --   PROT 7.2 6.8  --   ALBUMIN 3.9 3.4* 3.3*   Recent Labs  Lab 06/16/24 1303  LIPASE 30   CBC: Recent Labs  Lab 06/16/24 1303 06/17/24 0531 06/18/24 0343 06/19/24 0328 06/20/24 0243  WBC 13.5* 13.2* 22.8* 30.0* 23.5*  NEUTROABS  --   --  20.7* 27.9* 21.5*  HGB 11.4* 12.2 12.2 11.4* 10.8*  HCT 33.4* 36.2 35.8* 32.5* 30.4*  MCV 79.5* 79.4* 79.9* 77.2* 76.6*  PLT 239 226 240 PLATELET CLUMPS NOTED ON SMEAR, COUNT APPEARS ADEQUATE 176   Studies/Results: ECHOCARDIOGRAM COMPLETE Result Date: 06/19/2024    ECHOCARDIOGRAM REPORT   Patient Name:   Maria Hudson Date of Exam: 06/19/2024 Medical Rec #:  [id number]        Height:       63.0 in Accession #:    [id number]       Weight:       215.0 lb Date of Birth:  06-20-1960        BSA:          1.994 m Patient Age:    63  years         BP:           114/73 mmHg Patient Gender: F                HR:           96 bpm. Exam Location:  Inpatient Procedure: 2D Echo, Cardiac Doppler, Color Doppler and Strain Analysis (Both            Spectral and Color Flow Doppler were utilized during procedure). Indications:    R55 Syncope  History:        Patient has prior history of Echocardiogram examinations, most                 recent 10/28/2022. CHF, CAD; Risk Factors:Hypertension,                 Dyslipidemia, Diabetes and Sleep Apnea. ESRD.  Sonographer:    Ellouise Mose RDCS Referring  Phys: [id number] [facility]  Sonographer Comments: Technically difficult study due to poor echo windows. Image acquisition challenging due to patient body habitus. IMPRESSIONS  1. Left ventricular ejection fraction, by estimation, is 60 to 65%. The left ventricle has normal function. The left ventricle has no regional wall motion abnormalities. There is mild asymmetric left ventricular hypertrophy of the basal-septal segment. Left ventricular diastolic parameters are consistent with Grade I diastolic dysfunction (impaired relaxation).  2. Right ventricular systolic function is normal. The right ventricular size is normal. Tricuspid regurgitation signal is inadequate for assessing PA pressure.  3. The mitral valve is normal in structure. No evidence of mitral valve regurgitation. No evidence of mitral stenosis.  4. The aortic valve is tricuspid. There is mild calcification of the aortic valve. Aortic valve regurgitation is not visualized. Aortic valve sclerosis is present, with no evidence of aortic valve stenosis.  5. The inferior vena cava is normal in size with greater than 50% respiratory variability, suggesting right atrial pressure of 3 mmHg. FINDINGS  Left Ventricle: Left ventricular ejection fraction, by estimation, is 60 to 65%. The left ventricle has normal function. The left ventricle has no regional wall motion abnormalities. The left ventricular internal  cavity size was normal in size. There is  mild asymmetric left ventricular hypertrophy of the basal-septal segment. Left ventricular diastolic parameters are consistent with Grade I diastolic dysfunction (impaired relaxation). Right Ventricle: The right ventricular size is normal. No increase in right ventricular wall thickness. Right ventricular systolic function is normal. Tricuspid regurgitation signal is inadequate for assessing PA pressure. Left Atrium: Left atrial size was normal in size. Right Atrium: Right atrial size was normal in size. Pericardium: There is no evidence of pericardial effusion. Mitral Valve: The mitral valve is normal in structure. No evidence of mitral valve regurgitation. No evidence of mitral valve stenosis. Tricuspid Valve: The tricuspid valve is normal in structure. Tricuspid valve regurgitation is not demonstrated. No evidence of tricuspid stenosis. Aortic Valve: The aortic valve is tricuspid. There is mild calcification of the aortic valve. Aortic valve regurgitation is not visualized. Aortic valve sclerosis is present, with no evidence of aortic valve stenosis. Pulmonic Valve: The pulmonic valve was normal in structure. Pulmonic valve regurgitation is not visualized. No evidence of pulmonic stenosis. Aorta: The aortic root is normal in size and structure. Venous: The inferior vena cava is normal in size with greater than 50% respiratory variability, suggesting right atrial pressure of 3 mmHg. IAS/Shunts: No atrial level shunt detected by color flow Doppler.  LEFT VENTRICLE PLAX 2D LVIDd:         4.35 cm     Diastology LVIDs:         2.93 cm     LV e' medial:    6.31 cm/s LV PW:         1.60 cm     LV E/e' medial:  10.9 LV IVS:        1.30 cm     LV e' lateral:   9.90 cm/s LVOT diam:     1.90 cm     LV E/e' lateral: 6.9 LV SV:         48 LV SV Index:   24 LVOT Area:     2.84 cm LV IVRT:       109 msec  LV Volumes (MOD) LV vol d, MOD A2C: 58.3 ml LV vol d, MOD A4C: 59.2 ml LV vol s,  MOD A2C: 24.7 ml LV vol s, MOD  A4C: 20.4 ml LV SV MOD A2C:     33.6 ml LV SV MOD A4C:     59.2 ml LV SV MOD BP:      36.4 ml RIGHT VENTRICLE             IVC RV S prime:     12.00 cm/s  IVC diam: 1.50 cm TAPSE (M-mode): 1.4 cm                             PULMONARY VEINS                             Diastolic Velocity: 34.30 cm/s                             S/D Velocity:       1.60                             Systolic Velocity:  55.50 cm/s LEFT ATRIUM           Index        RIGHT ATRIUM          Index LA diam:      3.10 cm 1.55 cm/m   RA Area:     7.27 cm LA Vol (A2C): 20.1 ml 10.08 ml/m  RA Volume:   10.20 ml 5.12 ml/m LA Vol (A4C): 55.9 ml 28.03 ml/m  AORTIC VALVE LVOT Vmax:   108.00 cm/s LVOT Vmean:  73.900 cm/s LVOT VTI:    0.171 m  AORTA Ao Root diam: 3.00 cm Ao Asc diam:  3.60 cm MITRAL VALVE MV Area (PHT): 4.68 cm     SHUNTS MV Decel Time: 162 msec     Systemic VTI:  0.17 m MV E velocity: 68.70 cm/s   Systemic Diam: 1.90 cm MV A velocity: 128.00 cm/s MV E/A ratio:  0.54 Oneil Parchment MD Electronically signed by Oneil Parchment MD Signature Date/Time: 06/19/2024/2:43:24 PM    Final    Medications:  anticoagulant sodium citrate       aspirin  EC  81 mg Oral Daily   atorvastatin   80 mg Oral QPM   buPROPion   100 mg Oral BID   carvedilol   3.125 mg Oral BID   Chlorhexidine  Gluconate Cloth  6 each Topical Q0600   ezetimibe   10 mg Oral Daily   fidaxomicin   200 mg Oral BID   heparin   5,000 Units Subcutaneous Q8H   insulin  aspart  0-5 Units Subcutaneous QHS   insulin  aspart  0-6 Units Subcutaneous TID WC   isosorbide  mononitrate  120 mg Oral QHS   pregabalin   25 mg Oral Daily   saccharomyces boulardii  250 mg Oral BID   traZODone   50 mg Oral QHS    Dialysis Orders HHD Nx Stage - MTTF. EDW 102.5kg, 2K bath PD cath placed 06/02/24 - flushed 12/22, flushed 12/29   Assessment/Plan: C. Diff Colitis: + toxin and colitis on imaging. On Dificid . Ongoing diarrhea, WBC high, symptoms improving. ESRD: On home  HD, unable to dialyze for few days. S/p short HD 1/4 (all she could tolerate), for full HD today. New PD cath: Doubt peritonitis in light of clear cause of symptoms as above. Will flush today.  Hypertension/volume: BP ok, no edema on exam. Well below prior EDW.  Anemia: Hgb 10.8, no ESA for now.  Metabolic bone disease: Ca/Phos ok. Home off on binders until eating better.  T2DM   Izetta Boehringer, PA-C 06/20/2024, 12:27 PM  [facility]    "

## 2024-06-20 NOTE — Progress Notes (Signed)
 OT Cancellation Note  Patient Details Name: Maria Hudson MRN: [id number] DOB: [birth year]   Cancelled Treatment:    Reason Eval/Treat Not Completed: OT screened, no needs identified, will sign off.  Spoke with evaluating PT, advised patient is at her baseline, independent.    Marce Schartz D Gloriana Piltz 06/20/2024, 10:41 AM 06/20/2024  RP, OTR/L  Acute Rehabilitation Services  Office:  [phone]

## 2024-06-21 DIAGNOSIS — E1142 Type 2 diabetes mellitus with diabetic polyneuropathy: Secondary | ICD-10-CM | POA: Diagnosis not present

## 2024-06-21 DIAGNOSIS — Z992 Dependence on renal dialysis: Secondary | ICD-10-CM | POA: Diagnosis not present

## 2024-06-21 DIAGNOSIS — I5032 Chronic diastolic (congestive) heart failure: Secondary | ICD-10-CM | POA: Diagnosis not present

## 2024-06-21 DIAGNOSIS — N186 End stage renal disease: Secondary | ICD-10-CM | POA: Diagnosis not present

## 2024-06-21 DIAGNOSIS — A0472 Enterocolitis due to Clostridium difficile, not specified as recurrent: Secondary | ICD-10-CM | POA: Diagnosis not present

## 2024-06-21 LAB — GLUCOSE, CAPILLARY
Glucose-Capillary: 203 mg/dL — ABNORMAL HIGH (ref 70–99)
Glucose-Capillary: 216 mg/dL — ABNORMAL HIGH (ref 70–99)
Glucose-Capillary: 169 mg/dL — ABNORMAL HIGH (ref 70–99)
Glucose-Capillary: 175 mg/dL — ABNORMAL HIGH (ref 70–99)
Glucose-Capillary: 157 mg/dL — ABNORMAL HIGH (ref 70–99)

## 2024-06-21 LAB — CULTURE, BLOOD (ROUTINE X 2)
Culture: NO GROWTH
Culture: NO GROWTH
Special Requests: ADEQUATE
Special Requests: ADEQUATE

## 2024-06-21 LAB — CBC
HCT: 29.4 % — ABNORMAL LOW (ref 36.0–46.0)
Hemoglobin: 10.5 g/dL — ABNORMAL LOW (ref 12.0–15.0)
MCH: 27.2 pg (ref 26.0–34.0)
MCHC: 35.7 g/dL (ref 30.0–36.0)
MCV: 76.2 fL — ABNORMAL LOW (ref 80.0–100.0)
Platelets: 222 K/uL (ref 150–400)
RBC: 3.86 MIL/uL — ABNORMAL LOW (ref 3.87–5.11)
RDW: 16.6 % — ABNORMAL HIGH (ref 11.5–15.5)
WBC: 15.8 K/uL — ABNORMAL HIGH (ref 4.0–10.5)
nRBC: 0 % (ref 0.0–0.2)

## 2024-06-21 MED ORDER — INSULIN ASPART 100 UNIT/ML IJ SOLN
0.0000 [IU] | Freq: Three times a day (TID) | INTRAMUSCULAR | Status: DC
  Administered 2024-06-21: 3 [IU] via SUBCUTANEOUS
  Administered 2024-06-22: 5 [IU] via SUBCUTANEOUS
  Administered 2024-06-23 (×3): 3 [IU] via SUBCUTANEOUS
  Administered 2024-06-24: 2 [IU] via SUBCUTANEOUS
  Administered 2024-06-24: 3 [IU] via SUBCUTANEOUS
  Administered 2024-06-24 – 2024-06-25 (×2): 2 [IU] via SUBCUTANEOUS
  Filled 2024-06-21: qty 3
  Filled 2024-06-21 (×2): qty 5
  Filled 2024-06-21: qty 2
  Filled 2024-06-21: qty 3
  Filled 2024-06-21: qty 5
  Filled 2024-06-21: qty 2
  Filled 2024-06-21: qty 5
  Filled 2024-06-21: qty 3

## 2024-06-21 MED ORDER — INSULIN ASPART 100 UNIT/ML IJ SOLN
2.0000 [IU] | Freq: Once | INTRAMUSCULAR | Status: AC
  Administered 2024-06-21: 2 [IU] via SUBCUTANEOUS

## 2024-06-21 MED ORDER — CHLORHEXIDINE GLUCONATE CLOTH 2 % EX PADS
6.0000 | MEDICATED_PAD | Freq: Every day | CUTANEOUS | Status: DC
  Administered 2024-06-22 – 2024-06-25 (×4): 6 via TOPICAL

## 2024-06-21 MED ORDER — LOPERAMIDE HCL 2 MG PO CAPS
2.0000 mg | ORAL_CAPSULE | Freq: Once | ORAL | Status: AC
  Administered 2024-06-21: 2 mg via ORAL
  Filled 2024-06-21: qty 1

## 2024-06-21 NOTE — Progress Notes (Addendum)
 " [facility] Progress Note   Subjective:    Seen and examined patient at bedside. Tolerated yesterdays' HD with no UF. Denies SOB, CP, and N/V. Next HD 1/8.  Objective Vitals:   06/21/24 0041 06/21/24 0408 06/21/24 0753 06/21/24 1133  BP: 120/62 105/77 (!) 111/58 99/65  Pulse: 68  74 72  Resp: 17  18 19   Temp: 99.6 F (37.6 C) 99 F (37.2 C) 98.5 F (36.9 C) 98.3 F (36.8 C)  TempSrc: Oral Oral Oral Oral  SpO2:  95%    Weight:      Height:       Physical Exam General: Ill appearing woman, NAD. Room air Heart: RRR Lungs: CTAB Abdomen: mild generalized tenderness without guarding, PD cath in place Extremities: no LE edema Dialysis Access: LUE AVF + PD cath  [facility] Weights   06/16/24 1257 06/20/24 1437 06/20/24 1830  Weight: 97.5 kg 100.6 kg 100.6 kg    Intake/Output Summary (Last 24 hours) at 06/21/2024 1453 Last data filed at 06/21/2024 1345 Gross per 24 hour  Intake 622 ml  Output 0 ml  Net 622 ml    Additional Objective Labs: Basic Metabolic Panel: Recent Labs  Lab 06/17/24 0531 06/18/24 0343 06/19/24 0328  NA 134* 130* 132*  K 3.0* 4.5 3.7  CL 93* 91* 91*  CO2 20* 14* 21*  GLUCOSE 130* 125* 161*  BUN 106* 115* 80*  CREATININE 11.90* 15.00* 11.80*  CALCIUM  8.4* 8.8* 8.5*  PHOS  --   --  5.2*   Liver Function Tests: Recent Labs  Lab 06/16/24 1303 06/18/24 0343 06/19/24 0328  AST 22 29  --   ALT <5 <5  --   ALKPHOS 54 66  --   BILITOT 0.3 0.3  --   PROT 7.2 6.8  --   ALBUMIN 3.9 3.4* 3.3*   Recent Labs  Lab 06/16/24 1303  LIPASE 30   CBC: Recent Labs  Lab 06/17/24 0531 06/18/24 0343 06/19/24 0328 06/20/24 0243 06/21/24 0216  WBC 13.2* 22.8* 30.0* 23.5* 15.8*  NEUTROABS  --  20.7* 27.9* 21.5*  --   HGB 12.2 12.2 11.4* 10.8* 10.5*  HCT 36.2 35.8* 32.5* 30.4* 29.4*  MCV 79.4* 79.9* 77.2* 76.6* 76.2*  PLT 226 240 PLATELET CLUMPS NOTED ON SMEAR, COUNT APPEARS ADEQUATE 176 222   Blood Culture    Component Value  Date/Time   SDES  06/16/2024 1819    BLOOD BLOOD RIGHT ARM Performed at [facility], [address] [place], [place] [address]    [facility]  06/16/2024 1819    Blood Culture adequate volume Performed at [facility], [address], [place], [place] [address]    CULT  06/16/2024 1819    NO GROWTH 5 DAYS Performed at [facility], [address], [place], [place] [address]    REPTSTATUS 06/21/2024 FINAL 06/16/2024 1819    Cardiac Enzymes: No results for input(s): CKTOTAL, CKMB, CKMBINDEX, TROPONINI in the last 168 hours. CBG: Recent Labs  Lab 06/20/24 1155 06/20/24 2128 06/21/24 0104 06/21/24 0755 06/21/24 1139  GLUCAP 200* 195* 203* 216* 169*   Iron Studies: No results for input(s): IRON, TIBC, TRANSFERRIN, FERRITIN in the last 72 hours. No results found for: INR, PROTIME Studies/Results: No results found.  Medications:   aspirin  EC  81 mg Oral Daily   atorvastatin   80 mg Oral QPM   buPROPion   100 mg Oral BID   carvedilol   3.125 mg Oral BID   Chlorhexidine   Gluconate Cloth  6 each Topical Q0600   ezetimibe   10 mg Oral Daily   fidaxomicin   200 mg Oral BID   heparin   5,000 Units Subcutaneous Q8H   insulin  aspart  0-15 Units Subcutaneous TID WC   insulin  aspart  0-5 Units Subcutaneous QHS   isosorbide  mononitrate  120 mg Oral QHS   loperamide   2 mg Oral Once   pregabalin   25 mg Oral Daily   saccharomyces boulardii  250 mg Oral BID   traZODone   50 mg Oral QHS    Dialysis Orders: HHD Nx Stage - MTTF. EDW 102.5kg, 2K bath PD cath placed 06/02/24 - flushed 12/22, flushed 12/29  Assessment/Plan: C. Diff Colitis: + toxin and colitis on imaging. On Dificid . Ongoing diarrhea, WBC high, symptoms improving. ESRD: On home HD, unable to dialyze for few days. S/p short HD 1/4 (all she could tolerate), next HD 1/8. New PD cath: Doubt peritonitis in light of clear cause of symptoms as above. Flush PD  catheter 1/6. Next flush is due on 06/28/24. Hypertension/volume: BP ok, no edema on exam. Well below prior EDW. Lower EDW at discharge. Repeating CXR in AM. Anemia: Hgb 10.8, no ESA for now. Metabolic bone disease: Ca/Phos ok. Home off on binders until eating better. T2DM  [name], NP [facility] 06/21/2024,2:53 PM  LOS: 4 days    "

## 2024-06-21 NOTE — Plan of Care (Signed)
 Patient is progressing towards goals of care.     Problem: Education: Goal: Ability to describe self-care measures that may prevent or decrease complications (Diabetes Survival Skills Education) will improve Outcome: Progressing Goal: Individualized Educational Video(s) Outcome: Progressing   Problem: Coping: Goal: Ability to adjust to condition or change in health will improve Outcome: Progressing   Problem: Fluid Volume: Goal: Ability to maintain a balanced intake and output will improve Outcome: Progressing   Problem: Health Behavior/Discharge Planning: Goal: Ability to identify and utilize available resources and services will improve Outcome: Progressing Goal: Ability to manage health-related needs will improve Outcome: Progressing   Problem: Metabolic: Goal: Ability to maintain appropriate glucose levels will improve Outcome: Progressing   Problem: Nutritional: Goal: Maintenance of adequate nutrition will improve Outcome: Progressing Goal: Progress toward achieving an optimal weight will improve Outcome: Progressing   Problem: Skin Integrity: Goal: Risk for impaired skin integrity will decrease Outcome: Progressing   Problem: Tissue Perfusion: Goal: Adequacy of tissue perfusion will improve Outcome: Progressing   Problem: Education: Goal: Knowledge of General Education information will improve Description: Including pain rating scale, medication(s)/side effects and non-pharmacologic comfort measures Outcome: Progressing   Problem: Health Behavior/Discharge Planning: Goal: Ability to manage health-related needs will improve Outcome: Progressing   Problem: Clinical Measurements: Goal: Ability to maintain clinical measurements within normal limits will improve Outcome: Progressing Goal: Will remain free from infection Outcome: Progressing Goal: Diagnostic test results will improve Outcome: Progressing Goal: Respiratory complications will improve Outcome:  Progressing Goal: Cardiovascular complication will be avoided Outcome: Progressing   Problem: Activity: Goal: Risk for activity intolerance will decrease Outcome: Progressing   Problem: Nutrition: Goal: Adequate nutrition will be maintained Outcome: Progressing   Problem: Coping: Goal: Level of anxiety will decrease Outcome: Progressing   Problem: Elimination: Goal: Will not experience complications related to bowel motility Outcome: Progressing Goal: Will not experience complications related to urinary retention Outcome: Progressing   Problem: Pain Managment: Goal: General experience of comfort will improve and/or be controlled Outcome: Progressing   Problem: Safety: Goal: Ability to remain free from injury will improve Outcome: Progressing   Problem: Skin Integrity: Goal: Risk for impaired skin integrity will decrease Outcome: Progressing

## 2024-06-21 NOTE — Plan of Care (Signed)

## 2024-06-21 NOTE — Progress Notes (Signed)
 " PROGRESS NOTE    Maria Hudson  FMW:[id number] DOB: 29-May-1961 DOA: 06/16/2024 PCP: Shelda Atlas, MD (Confirm with Maria/family/NH records and if not entered, this HAS to be entered at [facility] point of entry. No PCP if truly none.)   Chief Complaint  Maria presents with   Fever    Brief Narrative Maria Hudson is a 64 year old female with past medical history of diabetes mellitus, hypertension, ESRD, chronic constipation, colonic polyps, and CAD. She was admitted for fever and diarrhea. She is found to have Clostridium Difficile Colitis.    Assessment & Plan: C. Difficile Colitis  Unknown cause.  Well-managed with fidaxomicin  and IVF. Continue fidaxomicin  until 06/27/24.   T2DM (HCC) Well-managed on novoLOG .  Continue at-home NOVOLOG  FLEXPEN and Ozempic .   Obesity, Class II  Continue at-home Ozempic .   Chronic Diastolic CHF  Stable on carvedilol . Continue at-home torsemide .   Refusal of blood products - Jehovah's witness    DVT prophylaxis: heparin  injection 5000U, SCD boots  Code Status: FULL  Family Communication: Issabella Rix, daughter, 01/07 via telephone  Disposition:   Status is: Inpatient Remains inpatient appropriate because: severity of illness    Consultants:   Procedures:  Antimicrobials: fidaxomicin  tablet 200mg  08-Jul-2024-06/27/24    Subjective: Maria is sitting upright in chair. Appears to be doing better. Stool is becoming less frequent and more formed. Still appears dehydrated and gets lightheaded on exertion.   Objective: Vitals:   06/20/24 1836 06/20/24 2053 06/21/24 0041 06/21/24 0408  BP: (!) 137/51 (!) 116/56 120/62 105/77  Pulse: 71 79 68   Resp: 13  17   Temp:  98.6 F (37 C) 99.6 F (37.6 C) 99 F (37.2 C)  TempSrc:  Oral Oral Oral  SpO2: 100%   95%  Weight:      Height:        Intake/Output Summary (Last 24 hours) at 06/21/2024 0710 Last data filed at 06/21/2024 0200 Gross per 24 hour  Intake 522 ml  Output 0 ml   Net 522 ml   Filed Weights   06/16/24 1257 06/20/24 1437 06/20/24 1830  Weight: 97.5 kg 100.6 kg 100.6 kg    Examination:  General exam: Appears calm and comfortable.   Respiratory system: Clear to auscultation. Respiratory effort normal. Cardiovascular system: S1 & S2 heard, RRR. No JVD, murmurs, rubs, gallops or clicks. No pedal edema. Gastrointestinal system: Abdomen is nondistended, soft and nontender. No organomegaly or masses felt. Normal bowel sounds heard. Central nervous system: Alert and oriented. No focal neurological deficits. Extremities: Symmetric 5 x 5 power. Skin: No rashes, lesions or ulcers Psychiatry: Judgement and insight appear normal. Mood & affect appropriate.    Data Reviewed: I have personally reviewed following labs and imaging studies.   CBC: Recent Labs  Lab 08-Jul-2024 0531 06/18/24 0343 06/19/24 0328 06/20/24 0243 06/21/24 0216  WBC 13.2* 22.8* 30.0* 23.5* 15.8*  NEUTROABS  --  20.7* 27.9* 21.5*  --   HGB 12.2 12.2 11.4* 10.8* 10.5*  HCT 36.2 35.8* 32.5* 30.4* 29.4*  MCV 79.4* 79.9* 77.2* 76.6* 76.2*  PLT 226 240 PLATELET CLUMPS NOTED ON SMEAR, COUNT APPEARS ADEQUATE 176 222    Basic Metabolic Panel: Recent Labs  Lab 06/16/24 1303 06/16/24 2006 07-08-2024 0039 07-08-2024 0531 06/18/24 0343 06/19/24 0328  NA 132*  --  129* 134* 130* 132*  K 2.8*  --  3.8 3.0* 4.5 3.7  CL 92*  --  93* 93* 91* 91*  CO2 18*  --  14*  20* 14* 21*  GLUCOSE 100*  --  151* 130* 125* 161*  BUN 107*  --  109* 106* 115* 80*  CREATININE 11.70*  --  12.10* 11.90* 15.00* 11.80*  CALCIUM  8.7*  --  8.4* 8.4* 8.8* 8.5*  MG  --  2.1  --   --   --   --   PHOS  --   --   --   --   --  5.2*    GFR: Estimated Creatinine Clearance: 5.6 mL/min (A) (by C-G formula based on SCr of 11.8 mg/dL (H)).  Liver Function Tests: Recent Labs  Lab 06/16/24 1303 06/18/24 0343 06/19/24 0328  AST 22 29  --   ALT <5 <5  --   ALKPHOS 54 66  --   BILITOT 0.3 0.3  --   PROT 7.2 6.8   --   ALBUMIN 3.9 3.4* 3.3*    CBG: Recent Labs  Lab 06/19/24 2039 06/20/24 0733 06/20/24 1155 06/20/24 2128 06/21/24 0104  GLUCAP 199* 167* 200* 195* 203*     Recent Results (from the past 240 hours)  Resp panel by RT-PCR (RSV, Flu A&B, Covid) Anterior Nasal Swab     Status: None   Collection Time: 06/16/24  1:04 PM   Specimen: Anterior Nasal Swab  Result Value Ref Range Status   SARS Coronavirus 2 by RT PCR NEGATIVE NEGATIVE Final    Comment: (NOTE) SARS-CoV-2 target nucleic acids are NOT DETECTED.  The SARS-CoV-2 RNA is generally detectable in upper respiratory specimens during the acute phase of infection. The lowest concentration of SARS-CoV-2 viral copies this assay can detect is 138 copies/mL. A negative result does not preclude SARS-Cov-2 infection and should not be used as the sole basis for treatment or other Maria management decisions. A negative result may occur with  improper specimen collection/handling, submission of specimen other than nasopharyngeal swab, presence of viral mutation(s) within the areas targeted by this assay, and inadequate number of viral copies(<138 copies/mL). A negative result must be combined with clinical observations, Maria history, and epidemiological information. The expected result is Negative.  Fact Sheet for Patients:  bloggercourse.com  Fact Sheet for Healthcare Providers:  seriousbroker.it  This test is no t yet approved or cleared by the [place]  FDA and  has been authorized for detection and/or diagnosis of SARS-CoV-2 by FDA under an Emergency Use Authorization (EUA). This EUA will remain  in effect (meaning this test can be used) for the duration of the COVID-19 declaration under Section 564(b)(1) of the Act, 21 U.S.C.section 360bbb-3(b)(1), unless the authorization is terminated  or revoked sooner.       Influenza A by PCR NEGATIVE NEGATIVE Final    Influenza B by PCR NEGATIVE NEGATIVE Final    Comment: (NOTE) The Xpert Xpress SARS-CoV-2/FLU/RSV plus assay is intended as an aid in the diagnosis of influenza from Nasopharyngeal swab specimens and should not be used as a sole basis for treatment. Nasal washings and aspirates are unacceptable for Xpert Xpress SARS-CoV-2/FLU/RSV testing.  Fact Sheet for Patients: bloggercourse.com  Fact Sheet for Healthcare Providers: seriousbroker.it  This test is not yet approved or cleared by the [place]  FDA and has been authorized for detection and/or diagnosis of SARS-CoV-2 by FDA under an Emergency Use Authorization (EUA). This EUA will remain in effect (meaning this test can be used) for the duration of the COVID-19 declaration under Section 564(b)(1) of the Act, 21 U.S.C. section 360bbb-3(b)(1), unless the authorization is terminated or revoked.  Resp Syncytial Virus by PCR NEGATIVE NEGATIVE Final    Comment: (NOTE) Fact Sheet for Patients: bloggercourse.com  Fact Sheet for Healthcare Providers: seriousbroker.it  This test is not yet approved or cleared by the [place]  FDA and has been authorized for detection and/or diagnosis of SARS-CoV-2 by FDA under an Emergency Use Authorization (EUA). This EUA will remain in effect (meaning this test can be used) for the duration of the COVID-19 declaration under Section 564(b)(1) of the Act, 21 U.S.C. section 360bbb-3(b)(1), unless the authorization is terminated or revoked.  Performed at [facility], [address], [place], [place] [address]   Respiratory (~20 pathogens) panel by PCR     Status: None   Collection Time: 06/16/24  6:07 PM   Specimen: Nasopharyngeal Swab; Respiratory  Result Value Ref Range Status   Adenovirus NOT DETECTED NOT DETECTED Final   Coronavirus 229E NOT DETECTED NOT DETECTED Final     Comment: (NOTE) The Coronavirus on the Respiratory Panel, DOES NOT test for the novel  Coronavirus (2019 nCoV)    Coronavirus HKU1 NOT DETECTED NOT DETECTED Final   Coronavirus NL63 NOT DETECTED NOT DETECTED Final   Coronavirus OC43 NOT DETECTED NOT DETECTED Final   Metapneumovirus NOT DETECTED NOT DETECTED Final   Rhinovirus / Enterovirus NOT DETECTED NOT DETECTED Final   Influenza A NOT DETECTED NOT DETECTED Final   Influenza B NOT DETECTED NOT DETECTED Final   Parainfluenza Virus 1 NOT DETECTED NOT DETECTED Final   Parainfluenza Virus 2 NOT DETECTED NOT DETECTED Final   Parainfluenza Virus 3 NOT DETECTED NOT DETECTED Final   Parainfluenza Virus 4 NOT DETECTED NOT DETECTED Final   Respiratory Syncytial Virus NOT DETECTED NOT DETECTED Final   Bordetella pertussis NOT DETECTED NOT DETECTED Final   Bordetella Parapertussis NOT DETECTED NOT DETECTED Final   Chlamydophila pneumoniae NOT DETECTED NOT DETECTED Final   Mycoplasma pneumoniae NOT DETECTED NOT DETECTED Final    Comment: Performed at [facility], [address] [place], [place] [address]  Blood culture (routine x 2)     Status: None (Preliminary result)   Collection Time: 06/16/24  6:14 PM   Specimen: BLOOD  Result Value Ref Range Status   Specimen Description   Final    BLOOD RIGHT ANTECUBITAL Performed at [facility], [address], [place], [place] [address]    Special Requests   Final    Blood Culture adequate volume Performed at [facility], [address], [place], [place] [address]    Culture   Final    NO GROWTH 4 DAYS Performed at [facility], [address], [place], [place] [address]    Report Status PENDING  Incomplete  Blood culture (routine x 2)     Status: None (Preliminary result)   Collection Time: 06/16/24  6:19 PM   Specimen: BLOOD  Result Value Ref Range Status   Specimen Description   Final    BLOOD BLOOD RIGHT ARM Performed at [facility], [address] [place], [place] [address]    Special Requests   Final    Blood Culture adequate volume Performed at [facility], [address], [place], [place] [address]    Culture   Final    NO GROWTH 4 DAYS Performed at [facility], [address], [place], [place] [address]    Report Status PENDING  Incomplete  Gastrointestinal Panel by PCR , Stool     Status: None  Collection Time: 06/16/24  7:04 PM   Specimen: Stool  Result Value Ref Range Status   Campylobacter species NOT DETECTED NOT DETECTED Final   Plesimonas shigelloides NOT DETECTED NOT DETECTED Final   Salmonella species NOT DETECTED NOT DETECTED Final   Yersinia enterocolitica NOT DETECTED NOT DETECTED Final   Vibrio species NOT DETECTED NOT DETECTED Final   Vibrio cholerae NOT DETECTED NOT DETECTED Final   Enteroaggregative E coli (EAEC) NOT DETECTED NOT DETECTED Final   Enteropathogenic E coli (EPEC) NOT DETECTED NOT DETECTED Final   Enterotoxigenic E coli (ETEC) NOT DETECTED NOT DETECTED Final   Shiga like toxin producing E coli (STEC) NOT DETECTED NOT DETECTED Final   Shigella/Enteroinvasive E coli (EIEC) NOT DETECTED NOT DETECTED Final   Cryptosporidium NOT DETECTED NOT DETECTED Final   Cyclospora cayetanensis NOT DETECTED NOT DETECTED Final   Entamoeba histolytica NOT DETECTED NOT DETECTED Final   Giardia lamblia NOT DETECTED NOT DETECTED Final   Adenovirus F40/41 NOT DETECTED NOT DETECTED Final   Astrovirus NOT DETECTED NOT DETECTED Final   Norovirus GI/GII NOT DETECTED NOT DETECTED Final   Rotavirus A NOT DETECTED NOT DETECTED Final   Sapovirus (I, II, IV, and V) NOT DETECTED NOT DETECTED Final    Comment: Performed at [facility], [address] [place], [place] [address]  C Difficile Quick Screen w PCR reflex     Status: Abnormal   Collection Time: 06/16/24  7:04 PM   Specimen: Stool  Result Value Ref Range Status   C Diff antigen  POSITIVE (A) NEGATIVE Final   C Diff toxin POSITIVE (A) NEGATIVE Final    Comment: CRITICAL RESULT CALLED TO, READ BACK BY AND VERIFIED WITH: RN R. MONTGOMERY [id number] @2304  FH    C Diff interpretation Toxin producing C. difficile detected.  Final    Comment: Performed at [facility], [address] [place], [place] [address]     Radiology Studies: ECHOCARDIOGRAM COMPLETE Result Date: 06/19/2024    ECHOCARDIOGRAM REPORT   Maria Name:   Maria Hudson Date of Exam: 06/19/2024 Medical Rec #:  [id number]        Height:       63.0 in Accession #:    [id number]       Weight:       215.0 lb Date of Birth:  [birth year]        BSA:          1.994 m Maria Age:    63 years         BP:           114/73 mmHg Maria Gender: F                HR:           96 bpm. Exam Location:  Inpatient Procedure: 2D Echo, Cardiac Doppler, Color Doppler and Strain Analysis (Both            Spectral and Color Flow Doppler were utilized during procedure). Indications:    R55 Syncope  History:        Maria has prior history of Echocardiogram examinations, most                 recent 10/28/2022. CHF, CAD; Risk Factors:Hypertension,                 Dyslipidemia, Diabetes and Sleep Apnea. ESRD.  Sonographer:    Ellouise Mose RDCS Referring Phys: [phone] Maria Hudson [facility]  Sonographer  Comments: Technically difficult study due to poor echo windows. Image acquisition challenging due to Maria body habitus. IMPRESSIONS  1. Left ventricular ejection fraction, by estimation, is 60 to 65%. The left ventricle has normal function. The left ventricle has no regional wall motion abnormalities. There is mild asymmetric left ventricular hypertrophy of the basal-septal segment. Left ventricular diastolic parameters are consistent with Grade I diastolic dysfunction (impaired relaxation).  2. Right ventricular systolic function is normal. The right ventricular size is normal. Tricuspid regurgitation signal is inadequate for assessing PA pressure.  3.  The mitral valve is normal in structure. No evidence of mitral valve regurgitation. No evidence of mitral stenosis.  4. The aortic valve is tricuspid. There is mild calcification of the aortic valve. Aortic valve regurgitation is not visualized. Aortic valve sclerosis is present, with no evidence of aortic valve stenosis.  5. The inferior vena cava is normal in size with greater than 50% respiratory variability, suggesting right atrial pressure of 3 mmHg. FINDINGS  Left Ventricle: Left ventricular ejection fraction, by estimation, is 60 to 65%. The left ventricle has normal function. The left ventricle has no regional wall motion abnormalities. The left ventricular internal cavity size was normal in size. There is  mild asymmetric left ventricular hypertrophy of the basal-septal segment. Left ventricular diastolic parameters are consistent with Grade I diastolic dysfunction (impaired relaxation). Right Ventricle: The right ventricular size is normal. No increase in right ventricular wall thickness. Right ventricular systolic function is normal. Tricuspid regurgitation signal is inadequate for assessing PA pressure. Left Atrium: Left atrial size was normal in size. Right Atrium: Right atrial size was normal in size. Pericardium: There is no evidence of pericardial effusion. Mitral Valve: The mitral valve is normal in structure. No evidence of mitral valve regurgitation. No evidence of mitral valve stenosis. Tricuspid Valve: The tricuspid valve is normal in structure. Tricuspid valve regurgitation is not demonstrated. No evidence of tricuspid stenosis. Aortic Valve: The aortic valve is tricuspid. There is mild calcification of the aortic valve. Aortic valve regurgitation is not visualized. Aortic valve sclerosis is present, with no evidence of aortic valve stenosis. Pulmonic Valve: The pulmonic valve was normal in structure. Pulmonic valve regurgitation is not visualized. No evidence of pulmonic stenosis. Aorta: The  aortic root is normal in size and structure. Venous: The inferior vena cava is normal in size with greater than 50% respiratory variability, suggesting right atrial pressure of 3 mmHg. IAS/Shunts: No atrial level shunt detected by color flow Doppler.  LEFT VENTRICLE PLAX 2D LVIDd:         4.35 cm     Diastology LVIDs:         2.93 cm     LV e' medial:    6.31 cm/s LV PW:         1.60 cm     LV E/e' medial:  10.9 LV IVS:        1.30 cm     LV e' lateral:   9.90 cm/s LVOT diam:     1.90 cm     LV E/e' lateral: 6.9 LV SV:         48 LV SV Index:   24 LVOT Area:     2.84 cm LV IVRT:       109 msec  LV Volumes (MOD) LV vol d, MOD A2C: 58.3 ml LV vol d, MOD A4C: 59.2 ml LV vol s, MOD A2C: 24.7 ml LV vol s, MOD A4C: 20.4 ml LV SV MOD A2C:  33.6 ml LV SV MOD A4C:     59.2 ml LV SV MOD BP:      36.4 ml RIGHT VENTRICLE             IVC RV S prime:     12.00 cm/s  IVC diam: 1.50 cm TAPSE (M-mode): 1.4 cm                             PULMONARY VEINS                             Diastolic Velocity: 34.30 cm/s                             S/D Velocity:       1.60                             Systolic Velocity:  55.50 cm/s LEFT ATRIUM           Index        RIGHT ATRIUM          Index LA diam:      3.10 cm 1.55 cm/m   RA Area:     7.27 cm LA Vol (A2C): 20.1 ml 10.08 ml/m  RA Volume:   10.20 ml 5.12 ml/m LA Vol (A4C): 55.9 ml 28.03 ml/m  AORTIC VALVE LVOT Vmax:   108.00 cm/s LVOT Vmean:  73.900 cm/s LVOT VTI:    0.171 m  AORTA Ao Root diam: 3.00 cm Ao Asc diam:  3.60 cm MITRAL VALVE MV Area (PHT): 4.68 cm     SHUNTS MV Decel Time: 162 msec     Systemic VTI:  0.17 m MV E velocity: 68.70 cm/s   Systemic Diam: 1.90 cm MV A velocity: 128.00 cm/s MV E/A ratio:  0.54 Maria Parchment MD Electronically signed by Maria Parchment MD Signature Date/Time: 06/19/2024/2:43:24 PM    Final     Scheduled Meds:  aspirin  EC  81 mg Oral Daily   atorvastatin   80 mg Oral QPM   buPROPion   100 mg Oral BID   carvedilol   3.125 mg Oral BID   Chlorhexidine   Gluconate Cloth  6 each Topical Q0600   ezetimibe   10 mg Oral Daily   fidaxomicin   200 mg Oral BID   heparin   5,000 Units Subcutaneous Q8H   insulin  aspart  0-5 Units Subcutaneous QHS   insulin  aspart  0-6 Units Subcutaneous TID WC   isosorbide  mononitrate  120 mg Oral QHS   pregabalin   25 mg Oral Daily   saccharomyces boulardii  250 mg Oral BID   traZODone   50 mg Oral QHS   Continuous Infusions: none   LOS: 4 days    Time spent: 35 minutes     Orland Born, Student PA  [facility]   To contact the attending provider between 7A-7P or the covering provider during after hours 7P-7A, please log into the web site [email or web address] and access using universal [place] password for that web site. If you do not have the password, please call the hospital operator.  06/21/2024, 7:10 AM  Maria Hudson, female   DOB: 08/01/1960, 64 y.o.   MRN: [id number]  "

## 2024-06-22 ENCOUNTER — Other Ambulatory Visit (HOSPITAL_COMMUNITY): Payer: Self-pay

## 2024-06-22 ENCOUNTER — Inpatient Hospital Stay (HOSPITAL_COMMUNITY)

## 2024-06-22 DIAGNOSIS — E1142 Type 2 diabetes mellitus with diabetic polyneuropathy: Secondary | ICD-10-CM | POA: Diagnosis not present

## 2024-06-22 DIAGNOSIS — N186 End stage renal disease: Secondary | ICD-10-CM | POA: Diagnosis not present

## 2024-06-22 DIAGNOSIS — Z992 Dependence on renal dialysis: Secondary | ICD-10-CM | POA: Diagnosis not present

## 2024-06-22 DIAGNOSIS — A0472 Enterocolitis due to Clostridium difficile, not specified as recurrent: Secondary | ICD-10-CM | POA: Diagnosis not present

## 2024-06-22 DIAGNOSIS — I5032 Chronic diastolic (congestive) heart failure: Secondary | ICD-10-CM | POA: Diagnosis not present

## 2024-06-22 LAB — RENAL FUNCTION PANEL
Albumin: 3.2 g/dL — ABNORMAL LOW (ref 3.5–5.0)
Anion gap: 18 — ABNORMAL HIGH (ref 5–15)
BUN: 71 mg/dL — ABNORMAL HIGH (ref 8–23)
CO2: 21 mmol/L — ABNORMAL LOW (ref 22–32)
Calcium: 8.4 mg/dL — ABNORMAL LOW (ref 8.9–10.3)
Chloride: 91 mmol/L — ABNORMAL LOW (ref 98–111)
Creatinine, Ser: 10 mg/dL — ABNORMAL HIGH (ref 0.44–1.00)
GFR, Estimated: 4 mL/min — ABNORMAL LOW
Glucose, Bld: 201 mg/dL — ABNORMAL HIGH (ref 70–99)
Phosphorus: 3.3 mg/dL (ref 2.5–4.6)
Potassium: 2.9 mmol/L — ABNORMAL LOW (ref 3.5–5.1)
Sodium: 131 mmol/L — ABNORMAL LOW (ref 135–145)

## 2024-06-22 LAB — CBC
HCT: 29.4 % — ABNORMAL LOW (ref 36.0–46.0)
Hemoglobin: 10.2 g/dL — ABNORMAL LOW (ref 12.0–15.0)
MCH: 26.5 pg (ref 26.0–34.0)
MCHC: 34.7 g/dL (ref 30.0–36.0)
MCV: 76.4 fL — ABNORMAL LOW (ref 80.0–100.0)
Platelets: 235 K/uL (ref 150–400)
RBC: 3.85 MIL/uL — ABNORMAL LOW (ref 3.87–5.11)
RDW: 16.8 % — ABNORMAL HIGH (ref 11.5–15.5)
WBC: 13 K/uL — ABNORMAL HIGH (ref 4.0–10.5)
nRBC: 0 % (ref 0.0–0.2)

## 2024-06-22 LAB — GLUCOSE, CAPILLARY
Glucose-Capillary: 202 mg/dL — ABNORMAL HIGH (ref 70–99)
Glucose-Capillary: 211 mg/dL — ABNORMAL HIGH (ref 70–99)
Glucose-Capillary: 118 mg/dL — ABNORMAL HIGH (ref 70–99)

## 2024-06-22 MED ORDER — HEPARIN SODIUM (PORCINE) 1000 UNIT/ML IJ SOLN
INTRAMUSCULAR | Status: AC
  Filled 2024-06-22: qty 2

## 2024-06-22 MED ORDER — PENTAFLUOROPROP-TETRAFLUOROETH EX AERO: 1.0000 | INHALATION_SPRAY | CUTANEOUS | Status: DC | PRN

## 2024-06-22 MED ORDER — HEPARIN SODIUM (PORCINE) 1000 UNIT/ML IJ SOLN
2000.0000 [IU] | Freq: Once | INTRAMUSCULAR | Status: AC
  Administered 2024-06-22: 2000 [IU] via INTRAVENOUS
  Filled 2024-06-22: qty 2

## 2024-06-22 MED ORDER — LOPERAMIDE HCL 2 MG PO CAPS
2.0000 mg | ORAL_CAPSULE | Freq: Once | ORAL | Status: AC
  Administered 2024-06-22: 2 mg via ORAL
  Filled 2024-06-22: qty 1

## 2024-06-22 MED ORDER — FIDAXOMICIN 200 MG PO TABS
200.0000 mg | ORAL_TABLET | Freq: Two times a day (BID) | ORAL | 0 refills | Status: AC
  Filled 2024-06-22: qty 14, 7d supply, fill #0

## 2024-06-22 MED ORDER — POTASSIUM CHLORIDE CRYS ER 20 MEQ PO TBCR
40.0000 meq | EXTENDED_RELEASE_TABLET | Freq: Once | ORAL | Status: AC
  Administered 2024-06-22: 40 meq via ORAL
  Filled 2024-06-22: qty 2

## 2024-06-22 MED ORDER — CHOLESTYRAMINE 4 G PO PACK
4.0000 g | PACK | Freq: Two times a day (BID) | ORAL | Status: DC
  Administered 2024-06-22 – 2024-06-23 (×2): 4 g via ORAL
  Filled 2024-06-22 (×2): qty 1

## 2024-06-22 NOTE — Plan of Care (Signed)
 Patient is progressing towards goals of care.     Problem: Education: Goal: Ability to describe self-care measures that may prevent or decrease complications (Diabetes Survival Skills Education) will improve Outcome: Progressing Goal: Individualized Educational Video(s) Outcome: Progressing   Problem: Coping: Goal: Ability to adjust to condition or change in health will improve Outcome: Progressing   Problem: Fluid Volume: Goal: Ability to maintain a balanced intake and output will improve Outcome: Progressing   Problem: Health Behavior/Discharge Planning: Goal: Ability to identify and utilize available resources and services will improve Outcome: Progressing Goal: Ability to manage health-related needs will improve Outcome: Progressing   Problem: Metabolic: Goal: Ability to maintain appropriate glucose levels will improve Outcome: Progressing   Problem: Nutritional: Goal: Maintenance of adequate nutrition will improve Outcome: Progressing Goal: Progress toward achieving an optimal weight will improve Outcome: Progressing   Problem: Skin Integrity: Goal: Risk for impaired skin integrity will decrease Outcome: Progressing   Problem: Tissue Perfusion: Goal: Adequacy of tissue perfusion will improve Outcome: Progressing   Problem: Education: Goal: Knowledge of General Education information will improve Description: Including pain rating scale, medication(s)/side effects and non-pharmacologic comfort measures Outcome: Progressing   Problem: Health Behavior/Discharge Planning: Goal: Ability to manage health-related needs will improve Outcome: Progressing   Problem: Clinical Measurements: Goal: Ability to maintain clinical measurements within normal limits will improve Outcome: Progressing Goal: Will remain free from infection Outcome: Progressing Goal: Diagnostic test results will improve Outcome: Progressing Goal: Respiratory complications will improve Outcome:  Progressing Goal: Cardiovascular complication will be avoided Outcome: Progressing   Problem: Activity: Goal: Risk for activity intolerance will decrease Outcome: Progressing   Problem: Nutrition: Goal: Adequate nutrition will be maintained Outcome: Progressing   Problem: Coping: Goal: Level of anxiety will decrease Outcome: Progressing   Problem: Elimination: Goal: Will not experience complications related to bowel motility Outcome: Progressing Goal: Will not experience complications related to urinary retention Outcome: Progressing   Problem: Pain Managment: Goal: General experience of comfort will improve and/or be controlled Outcome: Progressing   Problem: Safety: Goal: Ability to remain free from injury will improve Outcome: Progressing   Problem: Skin Integrity: Goal: Risk for impaired skin integrity will decrease Outcome: Progressing

## 2024-06-22 NOTE — Discharge Summary (Signed)
 "  PATIENT DETAILS Name: Maria Hudson Age: 64 y.o. Sex: female Date of Birth: Nov 05, 1960 MRN: [id number]. Admitting Physician: Camellia Door, DO ERE:Jcalzmz, Aliene, MD  Admit Date: 06/16/2024 Discharge date: 06/22/2024  Recommendations for Outpatient Follow-up:  Follow up with PCP in 1-2 weeks Please obtain CMP/CBC in one week  Admitted From:  Home  Disposition: Home   Discharge Condition: good  CODE STATUS:   Code Status: Full Code   Diet recommendation:  Diet Order             Diet renal/carb modified with fluid restriction           Diet renal/carb modified with fluid restriction Diet-HS Snack? Nothing; Fluid restriction: 2000 mL Fluid; Room service appropriate? Yes; Fluid consistency: Thin  Diet effective now                    Brief Summary: Patient is a 64 y.o.  female with history of ESRD on HD MWF-presented with nausea, vomiting, diarrhea, subjective fever, 1 episode of syncope (3 days prior to hospitalization)-found to have C. difficile colitis.     Significant events: 1/3>> admit to [facility]   Significant studies: 1/2>> chest x-ray: Central vascular congestion. 1/2>> x-ray left shoulder: No fracture 1/2>> CT abdomen/pelvis: Mild wall thickening of transverse/descending/sigmoid colon. 1/2>> CT head: No acute intracranial abnormality.   Significant microbiology data: 1/2>> COVID/influenza/RSV PCR: Negative 1/2>> stool C. difficile antigen/toxin: Positive 1/2>> GI pathogen panel: Not detected 1/2>> respiratory virus panel: Negative 1/2>> blood culture: Negative   Procedures: None   Consults: None    Brief Hospital Course: C. difficile colitis Denies any prior antibiotic exposure in the past 3 months Overall improved-leukocytosis has improved significantly Still with some diarrhea but slowing down-stools getting more formed-she is tolerating diet without any issues-no longer dizzy when she walks. Continue Dificid  on discharge Since tolerating  diet-diarrhea gradually improving-suspect she can go home today.  She has been very anxious about going home for the past couple of days.     Syncope Occurred 3 days prior to presentation Either vasovagal (defecation related) or orthostatic Mobilize with PT/OT Continue telemetry monitoring Echo with stable EF.   ESRD on HD MTWF (home hemodialysis) Nephrology following Recently had PD catheter placed for anticipation of initiation of peritoneal dialysis.     Hyponatremia Mild-asymptomatic Likely secondary to impaired free water  excretion Should improve with HD. Follow electrolytes periodically.   Chronic HFpEF Euvolemic Volume removal with HD   CAD No anginal symptoms currently Aspirin /statin/beta-blocker   HTN BP stable Coreg /Imdur    DM-2 (A1c 6.3 on 1/2) CBG stable SSI while inpatient, resume usual outpatient regimen on discharge.  HLD Statin/Zetia    Peripheral neuropathy Likely related to DM-2 Lyrica  As needed Ultram    Mood disorder Stable Continue Wellbutrin /trazodone    OSA CPAP nightly   Jehovah's Witness-refuses blood products   Class 2 Obesity Estimated body mass index is 38.09 kg/m as calculated from the following:   Height as of this encounter: 5' 3 (1.6 m).   Weight as of this encounter: 97.5 kg.  Discharge Diagnoses:  Principal Problem:   C. difficile colitis Active Problems:   ESRD on dialysis (HCC)   T2DM (type 2 diabetes mellitus) (HCC)   Obesity, Class II, BMI 35-39.9   Chronic diastolic CHF (congestive heart failure) (HCC)   Diabetic neuropathy (HCC)   Refusal of blood product - pt is Jehovah's witness   Essential hypertension   Hypokalemia   Clostridium difficile colitis   Discharge Instructions:  Activity:  As tolerated    Discharge Instructions     Ambulatory referral to Physical Therapy   Complete by: As directed    Diet renal/carb modified with fluid restriction   Complete by: As directed    Discharge  instructions   Complete by: As directed    Follow with Primary MD  Shelda Atlas, MD in 1-2 weeks  Please get a complete blood count and chemistry panel checked by your Primary MD at your next visit, and again as instructed by your Primary MD.  Get Medicines reviewed and adjusted: Please take all your medications with you for your next visit with your Primary MD  Laboratory/radiological data: Please request your Primary MD to go over all hospital tests and procedure/radiological results at the follow up, please ask your Primary MD to get all Hospital records sent to his/her office.  In some cases, they will be blood work, cultures and biopsy results pending at the time of your discharge. Please request that your primary care M.D. follows up on these results.  Also Note the following: If you experience worsening of your admission symptoms, develop shortness of breath, life threatening emergency, suicidal or homicidal thoughts you must seek medical attention immediately by calling 911 or calling your MD immediately  if symptoms less severe.  You must read complete instructions/literature along with all the possible adverse reactions/side effects for all the Medicines you take and that have been prescribed to you. Take any new Medicines after you have completely understood and accpet all the possible adverse reactions/side effects.   Do not drive when taking Pain medications or sleeping medications (Benzodaizepines)  Do not take more than prescribed Pain, Sleep and Anxiety Medications. It is not advisable to combine anxiety,sleep and pain medications without talking with your primary care practitioner  Special Instructions: If you have smoked or chewed Tobacco  in the last 2 yrs please stop smoking, stop any regular Alcohol  and or any Recreational drug use.  Wear Seat belts while driving.  Please note: You were cared for by a hospitalist during your hospital stay. Once you are discharged,  your primary care physician will handle any further medical issues. Please note that NO REFILLS for any discharge medications will be authorized once you are discharged, as it is imperative that you return to your primary care physician (or establish a relationship with a primary care physician if you do not have one) for your post hospital discharge needs so that they can reassess your need for medications and monitor your lab values.   Increase activity slowly   Complete by: As directed    No wound care   Complete by: As directed       Allergies as of 06/22/2024       Reactions   Metformin And Related Nausea Only   Other    Clonidine  patch causes skin irritation    Hydrocodone Itching   Patient able to tolerate when taken with Benadryl.   Oxycodone -acetaminophen  Itching   Patient able to tolerate when taken with Benadryl.        Medication List     PAUSE taking these medications    Linzess  145 MCG Caps capsule Wait to take this until your doctor or other care provider tells you to start again. Generic drug: linaclotide  TAKE 1 CAPSULE BY MOUTH DAILY BEFORE BREAKFAST. What changed: See the new instructions.   torsemide  20 MG tablet Wait to take this until your doctor or other care provider tells  you to start again. Commonly known as: DEMADEX  Take 40 mg by mouth See admin instructions. Take 2 tablets (40 mg) by mouth on Wednesdays, Saturdays and Sundays in the morning (non dialysis days)       STOP taking these medications    lactulose  (encephalopathy) 10 GM/15ML Soln Commonly known as: CHRONULAC        TAKE these medications    acetaminophen  500 MG tablet Commonly known as: TYLENOL  Take 1,000 mg by mouth every 6 (six) hours as needed for moderate pain or headache.   allopurinol  100 MG tablet Commonly known as: ZYLOPRIM  Take 100 mg by mouth in the morning.   aspirin  EC 81 MG tablet Take 81 mg by mouth in the morning. Swallow whole.   atorvastatin  80 MG  tablet Commonly known as: LIPITOR Take 1 tablet (80 mg total) by mouth every evening.   buPROPion  100 MG tablet Commonly known as: WELLBUTRIN  Take 100 mg by mouth 2 (two) times daily.   calcitRIOL 0.5 MCG capsule Commonly known as: ROCALTROL Take 0.5 mcg by mouth in the morning.   carvedilol  3.125 MG tablet Commonly known as: COREG  Take 3.125 mg by mouth 2 (two) times daily.   D3 25 MCG (1000 UT) capsule Generic drug: Cholecalciferol Take 1,000 Units by mouth daily.   diphenhydrAMINE 25 MG tablet Commonly known as: BENADRYL Take 25 mg by mouth every 6 (six) hours as needed for allergies or itching.   ezetimibe  10 MG tablet Commonly known as: ZETIA  Take 1 tablet (10 mg total) by mouth daily.   fidaxomicin  200 MG Tabs tablet Commonly known as: DIFICID  Take 1 tablet (200 mg total) by mouth 2 (two) times daily for 7 days.   FISH OIL PO Take 2,400 mg by mouth daily with lunch.   fluticasone  50 MCG/ACT nasal spray Commonly known as: FLONASE  Place 1 spray into both nostrils daily as needed for allergies or rhinitis.   isosorbide  mononitrate 120 MG 24 hr tablet Commonly known as: IMDUR  Take 60 mg by mouth at bedtime.   loratadine  10 MG tablet Commonly known as: CLARITIN  Take 10 mg by mouth in the morning.   Multivitamin Adult Tabs Take 1 tablet by mouth daily with lunch.   nitroGLYCERIN  0.4 MG SL tablet Commonly known as: NITROSTAT  Place 1 tablet (0.4 mg total) under the tongue every 5 (five) minutes as needed for chest pain.   NovoLOG  FlexPen 100 UNIT/ML FlexPen Generic drug: insulin  aspart Inject 5 Units into the skin 3 (three) times daily as needed for high blood sugar.   Ozempic  (2 MG/DOSE) 8 MG/3ML Sopn Generic drug: Semaglutide  (2 MG/DOSE) Inject 2 mg into the skin every 7 (seven) days.   pregabalin  75 MG capsule Commonly known as: LYRICA  Take 75 mg by mouth 2 (two) times daily.   sertraline  50 MG tablet Commonly known as: ZOLOFT  Take 50 mg by mouth  in the morning.   traMADol  50 MG tablet Commonly known as: Ultram  Take 1 tablet (50 mg total) by mouth every 6 (six) hours as needed for severe pain (pain score 7-10).   traZODone  50 MG tablet Commonly known as: DESYREL  Take 50 mg by mouth at bedtime.   TURMERIC CURCUMIN PO Take 2,000 mg by mouth daily with lunch.   Velphoro  500 MG chewable tablet Generic drug: sucroferric oxyhydroxide Chew 500 mg by mouth 3 (three) times daily with meals.        Follow-up Information     Shelda Atlas, MD. Schedule an appointment as soon as  possible for a visit in 1 week(s).   Specialty: Internal Medicine Contact information: [address] [place] [address] [phone]                Allergies[1]   Other Procedures/Studies: DG CHEST PORT 1 VIEW Result Date: 06/22/2024 CLINICAL DATA:  Volume excess. EXAM: PORTABLE CHEST 1 VIEW COMPARISON:  06/16/2024 FINDINGS: Similar low volume film. Cardiopericardial silhouette is at upper limits of normal for size. There is pulmonary vascular congestion without overt pulmonary edema. Basilar interstitial opacity is slightly more conspicuous and component of dependent interstitial edema not excluded. No pleural effusion. No acute bony abnormality. IMPRESSION: 1. Low volume film with pulmonary vascular congestion. 2. Basilar interstitial opacity is slightly more conspicuous and component of dependent interstitial edema not excluded. Electronically Signed   By: Camellia Candle M.D.   On: 06/22/2024 08:51   ECHOCARDIOGRAM COMPLETE Result Date: 06/19/2024    ECHOCARDIOGRAM REPORT   Patient Name:   Maria Hudson Date of Exam: 06/19/2024 Medical Rec #:  [id number]        Height:       63.0 in Accession #:    [id number]       Weight:       215.0 lb Date of Birth:  [birth year]        BSA:          1.994 m Patient Age:    63 years         BP:           114/73 mmHg Patient Gender: F                HR:           96 bpm. Exam Location:  Inpatient Procedure: 2D  Echo, Cardiac Doppler, Color Doppler and Strain Analysis (Both            Spectral and Color Flow Doppler were utilized during procedure). Indications:    R55 Syncope  History:        Patient has prior history of Echocardiogram examinations, most                 recent 10/28/2022. CHF, CAD; Risk Factors:Hypertension,                 Dyslipidemia, Diabetes and Sleep Apnea. ESRD.  Sonographer:    Ellouise Mose RDCS Referring Phys: [id number] [facility] [place]  Sonographer Comments: Technically difficult study due to poor echo windows. Image acquisition challenging due to patient body habitus. IMPRESSIONS  1. Left ventricular ejection fraction, by estimation, is 60 to 65%. The left ventricle has normal function. The left ventricle has no regional wall motion abnormalities. There is mild asymmetric left ventricular hypertrophy of the basal-septal segment. Left ventricular diastolic parameters are consistent with Grade I diastolic dysfunction (impaired relaxation).  2. Right ventricular systolic function is normal. The right ventricular size is normal. Tricuspid regurgitation signal is inadequate for assessing PA pressure.  3. The mitral valve is normal in structure. No evidence of mitral valve regurgitation. No evidence of mitral stenosis.  4. The aortic valve is tricuspid. There is mild calcification of the aortic valve. Aortic valve regurgitation is not visualized. Aortic valve sclerosis is present, with no evidence of aortic valve stenosis.  5. The inferior vena cava is normal in size with greater than 50% respiratory variability, suggesting right atrial pressure of 3 mmHg. FINDINGS  Left Ventricle: Left ventricular ejection fraction, by estimation, is 60 to 65%. The left  ventricle has normal function. The left ventricle has no regional wall motion abnormalities. The left ventricular internal cavity size was normal in size. There is  mild asymmetric left ventricular hypertrophy of the basal-septal segment. Left ventricular  diastolic parameters are consistent with Grade I diastolic dysfunction (impaired relaxation). Right Ventricle: The right ventricular size is normal. No increase in right ventricular wall thickness. Right ventricular systolic function is normal. Tricuspid regurgitation signal is inadequate for assessing PA pressure. Left Atrium: Left atrial size was normal in size. Right Atrium: Right atrial size was normal in size. Pericardium: There is no evidence of pericardial effusion. Mitral Valve: The mitral valve is normal in structure. No evidence of mitral valve regurgitation. No evidence of mitral valve stenosis. Tricuspid Valve: The tricuspid valve is normal in structure. Tricuspid valve regurgitation is not demonstrated. No evidence of tricuspid stenosis. Aortic Valve: The aortic valve is tricuspid. There is mild calcification of the aortic valve. Aortic valve regurgitation is not visualized. Aortic valve sclerosis is present, with no evidence of aortic valve stenosis. Pulmonic Valve: The pulmonic valve was normal in structure. Pulmonic valve regurgitation is not visualized. No evidence of pulmonic stenosis. Aorta: The aortic root is normal in size and structure. Venous: The inferior vena cava is normal in size with greater than 50% respiratory variability, suggesting right atrial pressure of 3 mmHg. IAS/Shunts: No atrial level shunt detected by color flow Doppler.  LEFT VENTRICLE PLAX 2D LVIDd:         4.35 cm     Diastology LVIDs:         2.93 cm     LV e' medial:    6.31 cm/s LV PW:         1.60 cm     LV E/e' medial:  10.9 LV IVS:        1.30 cm     LV e' lateral:   9.90 cm/s LVOT diam:     1.90 cm     LV E/e' lateral: 6.9 LV SV:         48 LV SV Index:   24 LVOT Area:     2.84 cm LV IVRT:       109 msec  LV Volumes (MOD) LV vol d, MOD A2C: 58.3 ml LV vol d, MOD A4C: 59.2 ml LV vol s, MOD A2C: 24.7 ml LV vol s, MOD A4C: 20.4 ml LV SV MOD A2C:     33.6 ml LV SV MOD A4C:     59.2 ml LV SV MOD BP:      36.4 ml RIGHT  VENTRICLE             IVC RV S prime:     12.00 cm/s  IVC diam: 1.50 cm TAPSE (M-mode): 1.4 cm                             PULMONARY VEINS                             Diastolic Velocity: 34.30 cm/s                             S/D Velocity:       1.60  Systolic Velocity:  55.50 cm/s LEFT ATRIUM           Index        RIGHT ATRIUM          Index LA diam:      3.10 cm 1.55 cm/m   RA Area:     7.27 cm LA Vol (A2C): 20.1 ml 10.08 ml/m  RA Volume:   10.20 ml 5.12 ml/m LA Vol (A4C): 55.9 ml 28.03 ml/m  AORTIC VALVE LVOT Vmax:   108.00 cm/s LVOT Vmean:  73.900 cm/s LVOT VTI:    0.171 m  AORTA Ao Root diam: 3.00 cm Ao Asc diam:  3.60 cm MITRAL VALVE MV Area (PHT): 4.68 cm     SHUNTS MV Decel Time: 162 msec     Systemic VTI:  0.17 m MV E velocity: 68.70 cm/s   Systemic Diam: 1.90 cm MV A velocity: 128.00 cm/s MV E/A ratio:  0.54 Oneil Parchment MD Electronically signed by Oneil Parchment MD Signature Date/Time: 06/19/2024/2:43:24 PM    Final    CT ABDOMEN PELVIS W CONTRAST Result Date: 06/16/2024 EXAM: CT ABDOMEN AND PELVIS WITH CONTRAST 06/16/2024 07:29:54 PM TECHNIQUE: CT of the abdomen and pelvis was performed with the administration of 100 mL of iohexol  (OMNIPAQUE ) 300 MG/ML solution. Multiplanar reformatted images are provided for review. Automated exposure control, iterative reconstruction, and/or weight-based adjustment of the mA/kV was utilized to reduce the radiation dose to as low as reasonably achievable. COMPARISON: 5125. CLINICAL HISTORY: Eval possible intra-abdominal infection from new peritoneal dialysis catheter. Patient with unexplained fever. FINDINGS: LOWER CHEST: Calcifications in the visualized right coronary artery. LIVER: The liver is unremarkable. GALLBLADDER AND BILE DUCTS: Small layering stones within the gallbladder. No biliary ductal dilatation. SPLEEN: No acute abnormality. PANCREAS: No acute abnormality. ADRENAL GLANDS: 1.2 cm left adrenal nodule is stable since the prior  study and likely reflects adenoma. KIDNEYS, URETERS AND BLADDER: No stones in the kidneys or ureters. No hydronephrosis. No perinephric or periureteral stranding. Urinary bladder is unremarkable. GI AND BOWEL: Stomach demonstrates no acute abnormality. There appears to be mild wall thickening involving the transverse colon, descending colon and possibly the sigmoid colon suggesting colitis. Recommend clinical correlation. No bowel obstruction. PERITONEUM AND RETROPERITONEUM: Peritoneal dialysis catheter terminates in the pelvis. Small amount of free fluid. No free air. VASCULATURE: Aorta is normal in caliber. Aortic atherosclerosis. Aortic catheter sclerosis. LYMPH NODES: No lymphadenopathy. REPRODUCTIVE ORGANS: Prior hysterectomy. No adnexal mass. BONES AND SOFT TISSUES: No acute osseous abnormality. No focal soft tissue abnormality. IMPRESSION: 1. Question mild wall thickening involving the transverse colon, descending colon, and possibly the sigmoid colon, suggestive of colitis. 2. Coronary artery disease, aortic atherosclerosis. 3. Cholelithiasis. Electronically signed by: Franky Crease MD 06/16/2024 07:36 PM EST RP Workstation: [id number]   CT Head Wo Contrast Result Date: 06/16/2024 EXAM: CT HEAD WITHOUT 06/16/2024 07:29:54 PM TECHNIQUE: CT of the head was performed without the administration of intravenous contrast. Automated exposure control, iterative reconstruction, and/or weight based adjustment of the mA/kV was utilized to reduce the radiation dose to as low as reasonably achievable. COMPARISON: 10/12/2023 CLINICAL HISTORY: Head trauma, moderate-severe; Syncopal episode and hit head. FINDINGS: BRAIN AND VENTRICLES: No acute intracranial hemorrhage. No mass effect or midline shift. No extra-axial fluid collection. No evidence of acute infarct. No hydrocephalus. ORBITS: No acute abnormality. SINUSES AND MASTOIDS: No acute abnormality. SOFT TISSUES AND SKULL: No acute skull fracture. No acute soft tissue  abnormality. IMPRESSION: 1. No acute intracranial abnormality. Electronically signed by: Franky Stanford  MD 06/16/2024 07:34 PM EST RP Workstation: [id number]   DG Shoulder Left Result Date: 06/16/2024 EXAM: 1 VIEW(S) XRAY OF THE LEFT SHOULDER 06/16/2024 06:57:13 PM COMPARISON: 02/03/2023 CLINICAL HISTORY: left shoulder pain after fall FINDINGS: BONES AND JOINTS: The glenohumeral joint is maintained. No fracture, subluxation, or dislocation. Degenerative changes in the [facility] joint with joint space narrowing and spurring. Spurring at the rotator cuff insertion at the greater tuberosity. SOFT TISSUES: No abnormal calcifications. Visualized lung is unremarkable. IMPRESSION: 1. No acute fracture, subluxation, or dislocation. Electronically signed by: Franky Crease MD 06/16/2024 07:32 PM EST RP Workstation: [id number]   DG Chest 2 View Result Date: 06/16/2024 CLINICAL DATA:  Fever. EXAM: CHEST - 2 VIEW COMPARISON:  12/18/2023 FINDINGS: Improved cardiomegaly from prior exam. Stable mediastinal contours. Slight central vascular congestion. No pleural effusion or pneumothorax. No focal airspace disease. No acute osseous findings. IMPRESSION: Improved cardiomegaly from prior exam. Slight central vascular congestion. Electronically Signed   By: Andrea Gasman M.D.   On: 06/16/2024 16:12     TODAY-DAY OF DISCHARGE:  Subjective:   [name] today has no headache,no chest abdominal pain,no new weakness tingling or numbness, feels much better wants to go home today.   Objective:   Blood pressure (!) 112/47, pulse 61, temperature 98.4 F (36.9 C), temperature source Oral, resp. rate 17, height 5' 3 (1.6 m), weight 100.6 kg, SpO2 95%.  Intake/Output Summary (Last 24 hours) at 06/22/2024 1102 Last data filed at 06/21/2024 1345 Gross per 24 hour  Intake 200 ml  Output --  Net 200 ml   Filed Weights   06/16/24 1257 06/20/24 1437 06/20/24 1830  Weight: 97.5 kg 100.6 kg 100.6 kg    Exam: Awake Alert, Oriented  *3, No new F.N deficits, Normal affect [place].AT,PERRAL Supple Neck,No JVD, No cervical lymphadenopathy appriciated.  Symmetrical Chest wall movement, Good air movement bilaterally, CTAB RRR,No Gallops,Rubs or new Murmurs, No Parasternal Heave +ve B.Sounds, Abd Soft, Non tender, No organomegaly appriciated, No rebound -guarding or rigidity. No Cyanosis, Clubbing or edema, No new Rash or bruise   PERTINENT RADIOLOGIC STUDIES: DG CHEST PORT 1 VIEW Result Date: 06/22/2024 CLINICAL DATA:  Volume excess. EXAM: PORTABLE CHEST 1 VIEW COMPARISON:  06/16/2024 FINDINGS: Similar low volume film. Cardiopericardial silhouette is at upper limits of normal for size. There is pulmonary vascular congestion without overt pulmonary edema. Basilar interstitial opacity is slightly more conspicuous and component of dependent interstitial edema not excluded. No pleural effusion. No acute bony abnormality. IMPRESSION: 1. Low volume film with pulmonary vascular congestion. 2. Basilar interstitial opacity is slightly more conspicuous and component of dependent interstitial edema not excluded. Electronically Signed   By: Camellia Candle M.D.   On: 06/22/2024 08:51     PERTINENT LAB RESULTS: CBC: Recent Labs    06/21/24 0216 06/22/24 0346  WBC 15.8* 13.0*  HGB 10.5* 10.2*  HCT 29.4* 29.4*  PLT 222 235   CMET CMP     Component Value Date/Time   NA 131 (L) 06/22/2024 0346   NA 144 02/22/2023 1520   K 2.9 (L) 06/22/2024 0346   CL 91 (L) 06/22/2024 0346   CO2 21 (L) 06/22/2024 0346   GLUCOSE 201 (H) 06/22/2024 0346   BUN 71 (H) 06/22/2024 0346   BUN 54 (H) 02/22/2023 1520   CREATININE 10.00 (H) 06/22/2024 0346   CALCIUM  8.4 (L) 06/22/2024 0346   PROT 6.8 06/18/2024 0343   PROT 6.5 02/22/2023 1520   ALBUMIN 3.2 (L) 06/22/2024 0346   ALBUMIN 4.0  02/22/2023 1520   AST 29 06/18/2024 0343   ALT <5 06/18/2024 0343   ALKPHOS 66 06/18/2024 0343   BILITOT 0.3 06/18/2024 0343   BILITOT <0.2 02/22/2023 1520   EGFR  13.0 07/07/2023 0939   EGFR 20 (L) 02/22/2023 1520   GFRNONAA 4 (L) 06/22/2024 0346    GFR Estimated Creatinine Clearance: 6.6 mL/min (A) (by C-G formula based on SCr of 10 mg/dL (H)). No results for input(s): LIPASE, AMYLASE in the last 72 hours. No results for input(s): CKTOTAL, CKMB, CKMBINDEX, TROPONINI in the last 72 hours. Invalid input(s): POCBNP No results for input(s): DDIMER in the last 72 hours. No results for input(s): HGBA1C in the last 72 hours. No results for input(s): CHOL, HDL, LDLCALC, TRIG, CHOLHDL, LDLDIRECT in the last 72 hours. No results for input(s): TSH, T4TOTAL, T3FREE, THYROIDAB in the last 72 hours.  Invalid input(s): FREET3 No results for input(s): VITAMINB12, FOLATE, FERRITIN, TIBC, IRON, RETICCTPCT in the last 72 hours. Coags: No results for input(s): INR in the last 72 hours.  Invalid input(s): PT Microbiology: Recent Results (from the past 240 hours)  Resp panel by RT-PCR (RSV, Flu A&B, Covid) Anterior Nasal Swab     Status: None   Collection Time: 06/16/24  1:04 PM   Specimen: Anterior Nasal Swab  Result Value Ref Range Status   SARS Coronavirus 2 by RT PCR NEGATIVE NEGATIVE Final    Comment: (NOTE) SARS-CoV-2 target nucleic acids are NOT DETECTED.  The SARS-CoV-2 RNA is generally detectable in upper respiratory specimens during the acute phase of infection. The lowest concentration of SARS-CoV-2 viral copies this assay can detect is 138 copies/mL. A negative result does not preclude SARS-Cov-2 infection and should not be used as the sole basis for treatment or other patient management decisions. A negative result may occur with  improper specimen collection/handling, submission of specimen other than nasopharyngeal swab, presence of viral mutation(s) within the areas targeted by this assay, and inadequate number of viral copies(<138 copies/mL). A negative result must be combined  with clinical observations, patient history, and epidemiological information. The expected result is Negative.  Fact Sheet for Patients:  bloggercourse.com  Fact Sheet for Healthcare Providers:  seriousbroker.it  This test is no t yet approved or cleared by the [place]  FDA and  has been authorized for detection and/or diagnosis of SARS-CoV-2 by FDA under an Emergency Use Authorization (EUA). This EUA will remain  in effect (meaning this test can be used) for the duration of the COVID-19 declaration under Section 564(b)(1) of the Act, 21 U.S.C.section 360bbb-3(b)(1), unless the authorization is terminated  or revoked sooner.       Influenza A by PCR NEGATIVE NEGATIVE Final   Influenza B by PCR NEGATIVE NEGATIVE Final    Comment: (NOTE) The Xpert Xpress SARS-CoV-2/FLU/RSV plus assay is intended as an aid in the diagnosis of influenza from Nasopharyngeal swab specimens and should not be used as a sole basis for treatment. Nasal washings and aspirates are unacceptable for Xpert Xpress SARS-CoV-2/FLU/RSV testing.  Fact Sheet for Patients: bloggercourse.com  Fact Sheet for Healthcare Providers: seriousbroker.it  This test is not yet approved or cleared by the [place]  FDA and has been authorized for detection and/or diagnosis of SARS-CoV-2 by FDA under an Emergency Use Authorization (EUA). This EUA will remain in effect (meaning this test can be used) for the duration of the COVID-19 declaration under Section 564(b)(1) of the Act, 21 U.S.C. section 360bbb-3(b)(1), unless the authorization is terminated or revoked.  Resp Syncytial Virus by PCR NEGATIVE NEGATIVE Final    Comment: (NOTE) Fact Sheet for Patients: bloggercourse.com  Fact Sheet for Healthcare Providers: seriousbroker.it  This test is not yet approved  or cleared by the [place]  FDA and has been authorized for detection and/or diagnosis of SARS-CoV-2 by FDA under an Emergency Use Authorization (EUA). This EUA will remain in effect (meaning this test can be used) for the duration of the COVID-19 declaration under Section 564(b)(1) of the Act, 21 U.S.C. section 360bbb-3(b)(1), unless the authorization is terminated or revoked.  Performed at [facility], [address] [place], [place] [address]   Respiratory (~20 pathogens) panel by PCR     Status: None   Collection Time: 06/16/24  6:07 PM   Specimen: Nasopharyngeal Swab; Respiratory  Result Value Ref Range Status   Adenovirus NOT DETECTED NOT DETECTED Final   Coronavirus 229E NOT DETECTED NOT DETECTED Final    Comment: (NOTE) The Coronavirus on the Respiratory Panel, DOES NOT test for the novel  Coronavirus (2019 nCoV)    Coronavirus HKU1 NOT DETECTED NOT DETECTED Final   Coronavirus NL63 NOT DETECTED NOT DETECTED Final   Coronavirus OC43 NOT DETECTED NOT DETECTED Final   Metapneumovirus NOT DETECTED NOT DETECTED Final   Rhinovirus / Enterovirus NOT DETECTED NOT DETECTED Final   Influenza A NOT DETECTED NOT DETECTED Final   Influenza B NOT DETECTED NOT DETECTED Final   Parainfluenza Virus 1 NOT DETECTED NOT DETECTED Final   Parainfluenza Virus 2 NOT DETECTED NOT DETECTED Final   Parainfluenza Virus 3 NOT DETECTED NOT DETECTED Final   Parainfluenza Virus 4 NOT DETECTED NOT DETECTED Final   Respiratory Syncytial Virus NOT DETECTED NOT DETECTED Final   Bordetella pertussis NOT DETECTED NOT DETECTED Final   Bordetella Parapertussis NOT DETECTED NOT DETECTED Final   Chlamydophila pneumoniae NOT DETECTED NOT DETECTED Final   Mycoplasma pneumoniae NOT DETECTED NOT DETECTED Final    Comment: Performed at [facility], [address], [place], [place] [address]  Blood culture (routine x 2)     Status: None   Collection Time: 06/16/24  6:14 PM    Specimen: BLOOD  Result Value Ref Range Status   Specimen Description   Final    BLOOD RIGHT ANTECUBITAL Performed at [facility], [address], [place], [place] [address]    Special Requests   Final    Blood Culture adequate volume Performed at [facility], [address], [place], [place] [address]    Culture   Final    NO GROWTH 5 DAYS Performed at [facility] [place] [facility], [address] [place], [place] [address]    Report Status 06/21/2024 FINAL  Final  Blood culture (routine x 2)     Status: None   Collection Time: 06/16/24  6:19 PM   Specimen: BLOOD  Result Value Ref Range Status   Specimen Description   Final    BLOOD BLOOD RIGHT ARM Performed at [facility], [address], [place], [place] [address]    Special Requests   Final    Blood Culture adequate volume Performed at [facility], [address], [place], [place] [address]    Culture   Final    NO GROWTH 5 DAYS Performed at [facility], [address] [place], [place] [address]    Report Status 06/21/2024 FINAL  Final  Gastrointestinal Panel by PCR , Stool     Status: None   Collection  Time: 06/16/24  7:04 PM   Specimen: Stool  Result Value Ref Range Status   Campylobacter species NOT DETECTED NOT DETECTED Final   Plesimonas shigelloides NOT DETECTED NOT DETECTED Final   Salmonella species NOT DETECTED NOT DETECTED Final   Yersinia enterocolitica NOT DETECTED NOT DETECTED Final   Vibrio species NOT DETECTED NOT DETECTED Final   Vibrio cholerae NOT DETECTED NOT DETECTED Final   Enteroaggregative E coli (EAEC) NOT DETECTED NOT DETECTED Final   Enteropathogenic E coli (EPEC) NOT DETECTED NOT DETECTED Final   Enterotoxigenic E coli (ETEC) NOT DETECTED NOT DETECTED Final   Shiga like toxin producing E coli (STEC) NOT DETECTED NOT DETECTED Final   Shigella/Enteroinvasive E coli (EIEC) NOT DETECTED NOT DETECTED Final    Cryptosporidium NOT DETECTED NOT DETECTED Final   Cyclospora cayetanensis NOT DETECTED NOT DETECTED Final   Entamoeba histolytica NOT DETECTED NOT DETECTED Final   Giardia lamblia NOT DETECTED NOT DETECTED Final   Adenovirus F40/41 NOT DETECTED NOT DETECTED Final   Astrovirus NOT DETECTED NOT DETECTED Final   Norovirus GI/GII NOT DETECTED NOT DETECTED Final   Rotavirus A NOT DETECTED NOT DETECTED Final   Sapovirus (I, II, IV, and V) NOT DETECTED NOT DETECTED Final    Comment: Performed at [facility], [address] [place], [place] [address]  C Difficile Quick Screen w PCR reflex     Status: Abnormal   Collection Time: 06/16/24  7:04 PM   Specimen: Stool  Result Value Ref Range Status   C Diff antigen POSITIVE (A) NEGATIVE Final   C Diff toxin POSITIVE (A) NEGATIVE Final    Comment: CRITICAL RESULT CALLED TO, READ BACK BY AND VERIFIED WITH: RN R. MONTGOMERY [id number] @2304  FH    C Diff interpretation Toxin producing C. difficile detected.  Final    Comment: Performed at [facility], [address] [place], [place] [address]    FURTHER DISCHARGE INSTRUCTIONS:  Get Medicines reviewed and adjusted: Please take all your medications with you for your next visit with your Primary MD  Laboratory/radiological data: Please request your Primary MD to go over all hospital tests and procedure/radiological results at the follow up, please ask your Primary MD to get all Hospital records sent to his/her office.  In some cases, they will be blood work, cultures and biopsy results pending at the time of your discharge. Please request that your primary care M.D. goes through all the records of your hospital data and follows up on these results.  Also Note the following: If you experience worsening of your admission symptoms, develop shortness of breath, life threatening emergency, suicidal or homicidal thoughts you must seek medical attention immediately by calling 911 or calling  your MD immediately  if symptoms less severe.  You must read complete instructions/literature along with all the possible adverse reactions/side effects for all the Medicines you take and that have been prescribed to you. Take any new Medicines after you have completely understood and accpet all the possible adverse reactions/side effects.   Do not drive when taking Pain medications or sleeping medications (Benzodaizepines)  Do not take more than prescribed Pain, Sleep and Anxiety Medications. It is not advisable to combine anxiety,sleep and pain medications without talking with your primary care practitioner  Special Instructions: If you have smoked or chewed Tobacco  in the last 2 yrs please stop smoking, stop any regular Alcohol  and or any Recreational drug use.  Wear Seat belts while driving.  Please note:  You were cared for by a hospitalist during your hospital stay. Once you are discharged, your primary care physician will handle any further medical issues. Please note that NO REFILLS for any discharge medications will be authorized once you are discharged, as it is imperative that you return to your primary care physician (or establish a relationship with a primary care physician if you do not have one) for your post hospital discharge needs so that they can reassess your need for medications and monitor your lab values.  Total Time spent coordinating discharge including counseling, education and face to face time equals greater than 30 minutes.  Signed: Donalda Applebaum 06/22/2024 11:02 AM      [1]  Allergies Allergen Reactions   Metformin And Related Nausea Only   Other     Clonidine  patch causes skin irritation    Hydrocodone Itching    Patient able to tolerate when taken with Benadryl.   Oxycodone -Acetaminophen  Itching    Patient able to tolerate when taken with Benadryl.   "

## 2024-06-22 NOTE — Progress Notes (Signed)
 " Maria Hudson Progress Note   Subjective:    Discussed with primary and plan for patient to go home. Seen and examined patient at bedside. She reports feeling well but remains to have diarrhea. Current K+ is 2.9 and PO supplementation already given. Plan to use 3K bath with HD here before she leaves.   Objective Vitals:   06/21/24 1133 06/21/24 1535 06/21/24 2000 06/22/24 0745  BP: 99/65 (!) 107/56 (!) 111/52 (!) 112/47  Pulse: 72 69 70 61  Resp: 19 17    Temp: 98.3 F (36.8 C) 99 F (37.2 C) 98.6 F (37 C) 98.4 F (36.9 C)  TempSrc: Oral Oral Oral Oral  SpO2:  95% 95% 95%  Weight:      Height:       Physical Exam General: Ill appearing woman, NAD. Room air Heart: RRR Lungs: CTAB Abdomen: mild generalized tenderness without guarding, PD cath in place Extremities: no LE edema Dialysis Access: LUE AVF + PD cath  [facility] Weights   06/16/24 1257 06/20/24 1437 06/20/24 1830  Weight: 97.5 kg 100.6 kg 100.6 kg    Intake/Output Summary (Last 24 hours) at 06/22/2024 1308 Last data filed at 06/22/2024 0800 Gross per 24 hour  Intake 400 ml  Output --  Net 400 ml    Additional Objective Labs: Basic Metabolic Panel: Recent Labs  Lab 06/18/24 0343 06/19/24 0328 06/22/24 0346  NA 130* 132* 131*  K 4.5 3.7 2.9*  CL 91* 91* 91*  CO2 14* 21* 21*  GLUCOSE 125* 161* 201*  BUN 115* 80* 71*  CREATININE 15.00* 11.80* 10.00*  CALCIUM  8.8* 8.5* 8.4*  PHOS  --  5.2* 3.3   Liver Function Tests: Recent Labs  Lab 06/16/24 1303 06/18/24 0343 06/19/24 0328 06/22/24 0346  AST 22 29  --   --   ALT <5 <5  --   --   ALKPHOS 54 66  --   --   BILITOT 0.3 0.3  --   --   PROT 7.2 6.8  --   --   ALBUMIN 3.9 3.4* 3.3* 3.2*   Recent Labs  Lab 06/16/24 1303  LIPASE 30   CBC: Recent Labs  Lab 06/18/24 0343 06/19/24 0328 06/20/24 0243 06/21/24 0216 06/22/24 0346  WBC 22.8* 30.0* 23.5* 15.8* 13.0*  NEUTROABS 20.7* 27.9* 21.5*  --   --   HGB 12.2 11.4* 10.8* 10.5*  10.2*  HCT 35.8* 32.5* 30.4* 29.4* 29.4*  MCV 79.9* 77.2* 76.6* 76.2* 76.4*  PLT 240 PLATELET CLUMPS NOTED ON SMEAR, COUNT APPEARS ADEQUATE 176 222 235   Blood Culture    Component Value Date/Time   SDES  06/16/2024 1819    BLOOD BLOOD RIGHT ARM Performed at [facility], [address] [place], [place] [address]    [facility]  06/16/2024 1819    Blood Culture adequate volume Performed at [facility], [address] [place], [place] [address]    CULT  06/16/2024 1819    NO GROWTH 5 DAYS Performed at [facility], [address], [place], [place] [address]    REPTSTATUS 06/21/2024 FINAL 06/16/2024 1819    Cardiac Enzymes: No results for input(s): CKTOTAL, CKMB, CKMBINDEX, TROPONINI in the last 168 hours. CBG: Recent Labs  Lab 06/21/24 1139 06/21/24 1546 06/21/24 2112 06/22/24 0749 06/22/24 1132  GLUCAP 169* 175* 157* 202* 211*   Iron Studies: No results for input(s): IRON, TIBC, TRANSFERRIN, FERRITIN in the last 72 hours. No results found for: INR,  PROTIME Studies/Results: DG CHEST PORT 1 VIEW Result Date: 06/22/2024 CLINICAL DATA:  Volume excess. EXAM: PORTABLE CHEST 1 VIEW COMPARISON:  06/16/2024 FINDINGS: Similar low volume film. Cardiopericardial silhouette is at upper limits of normal for size. There is pulmonary vascular congestion without overt pulmonary edema. Basilar interstitial opacity is slightly more conspicuous and component of dependent interstitial edema not excluded. No pleural effusion. No acute bony abnormality. IMPRESSION: 1. Low volume film with pulmonary vascular congestion. 2. Basilar interstitial opacity is slightly more conspicuous and component of dependent interstitial edema not excluded. Electronically Signed   By: Camellia Candle M.D.   On: 06/22/2024 08:51    Medications:   aspirin  EC  81 mg Oral Daily   atorvastatin   80 mg Oral QPM   buPROPion   100 mg Oral BID   carvedilol    3.125 mg Oral BID   Chlorhexidine  Gluconate Cloth  6 each Topical Q0600   ezetimibe   10 mg Oral Daily   fidaxomicin   200 mg Oral BID   heparin   5,000 Units Subcutaneous Q8H   insulin  aspart  0-15 Units Subcutaneous TID WC   insulin  aspart  0-5 Units Subcutaneous QHS   isosorbide  mononitrate  120 mg Oral QHS   pregabalin   25 mg Oral Daily   saccharomyces boulardii  250 mg Oral BID   traZODone   50 mg Oral QHS    Dialysis Orders: HHD Nx Stage - MTTF. EDW 102.5kg, 2K bath PD cath placed 06/02/24 - flushed 12/22, flushed 12/29  Assessment/Plan: C. Diff Colitis: + toxin and colitis on imaging. On Dificid . Ongoing diarrhea, WBC high, symptoms improving. ESRD: On home HD, unable to dialyze for few days. S/p short HD 1/4 (all she could tolerate), next HD this afternoon. Hypokalemia: Current K+ 2.9 and KCL already given. Plan to use 3K bath with HD today. Plan to discuss with HT on monitoring for labs closely for the next few weeks. New PD cath: Doubt peritonitis in light of clear cause of symptoms as above. Flush PD catheter 1/6. Next flush is due on 06/28/24. Hypertension/volume: BP ok, no edema on exam. Recent CXR showed vascular congestion so will plan to UF with HD today. Well below prior EDW. Lower EDW at discharge. Anemia: Hgb 10.8, no ESA for now. Metabolic bone disease: Ca/Phos ok. Home off on binders until eating better. T2DM  Maria Piety, NP [facility] 06/22/2024,1:08 PM  LOS: 5 days    "

## 2024-06-22 NOTE — Plan of Care (Signed)

## 2024-06-22 NOTE — Procedures (Signed)
 HD Note:  Some information was entered later than the data was gathered due to patient care needs. The stated time with the data is accurate.  Received patient in bed to unit.   Alert and oriented.   Informed consent signed and in chart.   Access used: Upper left arm fistula Access issues: None  Patient tolerated treatment well.  Patient had loose stool before treatment.  Treatment  ended 6 min early because of urgency to get to the toilet for the second loose stool.  TX duration: 3 hours and 21 min  Alert, without acute distress.  Total UF removed: 1000 ml  Hand-off given to patient's nurse.   Transported back to the room   Andrea Colglazier L. Lenon, RN [facility].

## 2024-06-22 NOTE — TOC Transition Note (Signed)
 Transition of Care [facility]) - Discharge Note   Patient Details  Name: Maria Hudson MRN: [id number] Date of Birth: Oct 13, 1960  [facility] CM/SW Contact:  Landry DELENA Senters, RN Phone Number: 06/22/2024, 11:08 AM   Clinical Narrative:    Patient will be discharging to home, with family providing transportation today.   OP therapy rec, referral sent to [facility] OP rehab per patient request. Info on AVS.   BSC rec per therapy. Patient refuses this, reporting it is not needed in her space at home.   No further needs identified by CM.   Final next level of care: OP Rehab Barriers to Discharge: No Barriers Identified   Patient Goals and CMS Choice            Discharge Placement                       Discharge Plan and Services Additional resources added to the After Visit Summary for                                       Social Drivers of Health (SDOH) Interventions SDOH Screenings   Food Insecurity: No Food Insecurity (06/17/2024)  Housing: Low Risk (06/17/2024)  Transportation Needs: No Transportation Needs (06/17/2024)  Utilities: Not At Risk (06/17/2024)  Alcohol Screen: Low Risk (06/17/2023)  Depression (PHQ2-9): Low Risk (05/12/2023)  Financial Resource Strain: Low Risk (05/31/2023)   Received from [facility]  Physical Activity: Inactive (08/23/2023)  Social Connections: Moderately Isolated (08/17/2023)  Stress: No Stress Concern Present (08/23/2023)  Tobacco Use: Low Risk (06/17/2024)  Health Literacy: Adequate Health Literacy (07/19/2023)     Readmission Risk Interventions     No data to display

## 2024-06-22 NOTE — Progress Notes (Signed)
 Brief note Per nursing staff-patient patient now feels more weak-and has redeveloped some dizziness with ambulation. She will get HD later today Will hold discharge for tonight and reassess for safe discharge tomorrow.

## 2024-06-23 ENCOUNTER — Other Ambulatory Visit (HOSPITAL_COMMUNITY): Payer: Self-pay

## 2024-06-23 DIAGNOSIS — I5032 Chronic diastolic (congestive) heart failure: Secondary | ICD-10-CM | POA: Diagnosis not present

## 2024-06-23 DIAGNOSIS — K589 Irritable bowel syndrome without diarrhea: Secondary | ICD-10-CM | POA: Diagnosis not present

## 2024-06-23 DIAGNOSIS — Z992 Dependence on renal dialysis: Secondary | ICD-10-CM | POA: Diagnosis not present

## 2024-06-23 DIAGNOSIS — A0472 Enterocolitis due to Clostridium difficile, not specified as recurrent: Secondary | ICD-10-CM | POA: Diagnosis not present

## 2024-06-23 DIAGNOSIS — E1142 Type 2 diabetes mellitus with diabetic polyneuropathy: Secondary | ICD-10-CM | POA: Diagnosis not present

## 2024-06-23 DIAGNOSIS — N186 End stage renal disease: Secondary | ICD-10-CM | POA: Diagnosis not present

## 2024-06-23 LAB — RENAL FUNCTION PANEL
Albumin: 3.1 g/dL — ABNORMAL LOW (ref 3.5–5.0)
Anion gap: 14 (ref 5–15)
BUN: 39 mg/dL — ABNORMAL HIGH (ref 8–23)
CO2: 27 mmol/L (ref 22–32)
Calcium: 8.5 mg/dL — ABNORMAL LOW (ref 8.9–10.3)
Chloride: 92 mmol/L — ABNORMAL LOW (ref 98–111)
Creatinine, Ser: 6.08 mg/dL — ABNORMAL HIGH (ref 0.44–1.00)
GFR, Estimated: 7 mL/min — ABNORMAL LOW
Glucose, Bld: 261 mg/dL — ABNORMAL HIGH (ref 70–99)
Phosphorus: 2.7 mg/dL (ref 2.5–4.6)
Potassium: 3.3 mmol/L — ABNORMAL LOW (ref 3.5–5.1)
Sodium: 133 mmol/L — ABNORMAL LOW (ref 135–145)

## 2024-06-23 LAB — CBC
HCT: 29.9 % — ABNORMAL LOW (ref 36.0–46.0)
Hemoglobin: 10.3 g/dL — ABNORMAL LOW (ref 12.0–15.0)
MCH: 26.5 pg (ref 26.0–34.0)
MCHC: 34.4 g/dL (ref 30.0–36.0)
MCV: 77.1 fL — ABNORMAL LOW (ref 80.0–100.0)
Platelets: 246 K/uL (ref 150–400)
RBC: 3.88 MIL/uL (ref 3.87–5.11)
RDW: 16.8 % — ABNORMAL HIGH (ref 11.5–15.5)
WBC: 10.7 K/uL — ABNORMAL HIGH (ref 4.0–10.5)
nRBC: 0 % (ref 0.0–0.2)

## 2024-06-23 LAB — GLUCOSE, CAPILLARY
Glucose-Capillary: 208 mg/dL — ABNORMAL HIGH (ref 70–99)
Glucose-Capillary: 167 mg/dL — ABNORMAL HIGH (ref 70–99)
Glucose-Capillary: 232 mg/dL — ABNORMAL HIGH (ref 70–99)
Glucose-Capillary: 137 mg/dL — ABNORMAL HIGH (ref 70–99)

## 2024-06-23 MED ORDER — POTASSIUM CHLORIDE CRYS ER 20 MEQ PO TBCR
20.0000 meq | EXTENDED_RELEASE_TABLET | Freq: Every day | ORAL | 0 refills | Status: AC
  Filled 2024-06-23: qty 7, 7d supply, fill #0

## 2024-06-23 MED ORDER — POTASSIUM CHLORIDE CRYS ER 20 MEQ PO TBCR
20.0000 meq | EXTENDED_RELEASE_TABLET | Freq: Every day | ORAL | Status: DC
  Administered 2024-06-23 – 2024-06-25 (×3): 20 meq via ORAL
  Filled 2024-06-23 (×2): qty 1

## 2024-06-23 MED ORDER — POTASSIUM CHLORIDE CRYS ER 20 MEQ PO TBCR
40.0000 meq | EXTENDED_RELEASE_TABLET | Freq: Once | ORAL | Status: AC
  Administered 2024-06-23: 40 meq via ORAL
  Filled 2024-06-23: qty 2

## 2024-06-23 MED ORDER — LOPERAMIDE HCL 2 MG PO CAPS: 2.0000 mg | ORAL_CAPSULE | Freq: Once | ORAL | Status: DC

## 2024-06-23 MED ORDER — LOPERAMIDE HCL 2 MG PO CAPS
2.0000 mg | ORAL_CAPSULE | Freq: Three times a day (TID) | ORAL | Status: DC | PRN
  Administered 2024-06-23 – 2024-06-24 (×4): 2 mg via ORAL
  Filled 2024-06-23 (×4): qty 1

## 2024-06-23 NOTE — Consult Note (Signed)
 "        [facility]    Date of Admission:  06/16/2024     Reason for Consult: c diff colitis    Referring Provider: Raenelle Grew     Lines:  Peripheral iv's  Abx: 1/3-c fidaxomycin   1/2-3 metronidazole  1/2-3 cipro         Assessment: 64 yo female esrd on iHD admitted 1/2 with n/v/diarrhea for a few days found to have cdiff   She has had IBS sx and constipation and is on linzess  but hasn't taken several days prior to admission  Given a couple days systemic abx as well seemed for nonspecific colitis  Presenting fever 101 x1 Wbc max 30; now 10.7 on 06/22/24  Wbc improving and feels well otherwise besides still having high frequency of watery stool 10-15 a day   Appear cdiff well treated with fidaxomycin. Improvement doensn't always have all features, sometimes still frequent stools. At this time I am comfortable can use antimotility  Plan: Finish 10 days fidaxomycin on 1/13 Schedule immodium q8hr and titrate use for prn >6bm a day Do not see need for further flagyl  Will sign off  Maintain contact cdiff isolation Discussed with primary team     ------------------------------------------------ Principal Problem:   C. difficile colitis Active Problems:   T2DM (type 2 diabetes mellitus) (HCC)   Obesity, Class II, BMI 35-39.9   Chronic diastolic CHF (congestive heart failure) (HCC)   Diabetic neuropathy (HCC)   Refusal of blood product - pt is Jehovah's witness   Essential hypertension   ESRD on dialysis (HCC)   Hypokalemia   Clostridium difficile colitis    HPI: Maria Hudson is a 64 y.o. female esrd on iHD admitted 1/2 with n/v/diarrhea for a few days found to have cdiff   She has had IBS sx and constipation and is on linzess  but hasn't taken several days prior to admission  Given a couple days systemic abx as well seemed for nonspecific colitis  Had pd cath placed a few weeks ago and given cefazolin   Diarrhea about  a week pta  Febrile on admission  Bcx negative Given cipro /flagyl  for 1 day but then switched to difficid as cdiff testing positive for toxin/antigen    Still significant diarrhea but peaked wbc 30 normalized Feels well    Family History  Problem Relation Age of Onset   Hypertension Mother    Stroke Mother    Diabetes Mother    Other Mother        enlarged heart   Hypertension Father    Hypertension Sister    Diabetes Sister    CAD Sister        enlarged heart   Kidney disease Sister    Diabetes Paternal Grandmother    Hypertension Daughter    Diabetes Daughter    Hypertension Son    Diabetes Son    Breast cancer Paternal Aunt     Social History[1]  Allergies[2]  Review of Systems: ROS All Other ROS was negative, except mentioned above   Past Medical History:  Diagnosis Date   Acute on chronic diastolic heart failure (HCC) 03/05/2021   Anemia    Arthritis    CAD (coronary artery disease)    CHF (congestive heart failure) (HCC)    Colon polyps    CVA (cerebral vascular accident) (HCC)    Depression    Diabetes mellitus without complication (HCC)    type 2   Difficult intubation  Dyspnea    ESRD on hemodialysis (HCC)    Heart murmur    Hypertensive urgency 04/14/2021   Memory loss    mild   Myocardial infarction (HCC)    OSA (obstructive sleep apnea) 05/27/2021   uses CPAP 12-18 cm   Pneumonia    several times   Pure hypercholesterolemia 05/27/2021   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   Stroke (HCC)        Scheduled Meds:  aspirin  EC  81 mg Oral Daily   atorvastatin   80 mg Oral QPM   buPROPion   100 mg Oral BID   Chlorhexidine  Gluconate Cloth  6 each Topical Q0600   ezetimibe   10 mg Oral Daily   fidaxomicin   200 mg Oral BID   heparin   5,000 Units Subcutaneous Q8H   insulin  aspart  0-15 Units Subcutaneous TID WC   insulin  aspart  0-5 Units Subcutaneous QHS   pregabalin   25 mg Oral Daily   traZODone   50 mg Oral QHS   Continuous  Infusions: PRN Meds:.acetaminophen , loperamide , ondansetron  **OR** ondansetron  (ZOFRAN ) IV, traMADol    OBJECTIVE: Blood pressure (!) 114/58, pulse 75, temperature 98 F (36.7 C), temperature source Oral, resp. rate 16, height 5' 3 (1.6 m), weight 97.4 kg, SpO2 97%.  Physical Exam  General/constitutional: no distress, pleasant HEENT: Normocephalic, PER, Conj Clear, EOMI, Oropharynx clear Neck supple CV: rrr no mrg Lungs: clear to auscultation, normal respiratory effort Abd: Soft, Nontender Ext: no edema Skin: No Rash Neuro: nonfocal MSK: no peripheral joint swelling/tenderness/warmth; back spines nontender     Lab Results Lab Results  Component Value Date   WBC 10.7 (H) 06/23/2024   HGB 10.3 (L) 06/23/2024   HCT 29.9 (L) 06/23/2024   MCV 77.1 (L) 06/23/2024   PLT 246 06/23/2024    Lab Results  Component Value Date   CREATININE 6.08 (H) 06/23/2024   BUN 39 (H) 06/23/2024   NA 133 (L) 06/23/2024   K 3.3 (L) 06/23/2024   CL 92 (L) 06/23/2024   CO2 27 06/23/2024    Lab Results  Component Value Date   ALT <5 06/18/2024   AST 29 06/18/2024   ALKPHOS 66 06/18/2024   BILITOT 0.3 06/18/2024      Microbiology: Recent Results (from the past 240 hours)  Resp panel by RT-PCR (RSV, Flu A&B, Covid) Anterior Nasal Swab     Status: None   Collection Time: 06/16/24  1:04 PM   Specimen: Anterior Nasal Swab  Result Value Ref Range Status   SARS Coronavirus 2 by RT PCR NEGATIVE NEGATIVE Final    Comment: (NOTE) SARS-CoV-2 target nucleic acids are NOT DETECTED.  The SARS-CoV-2 RNA is generally detectable in upper respiratory specimens during the acute phase of infection. The lowest concentration of SARS-CoV-2 viral copies this assay can detect is 138 copies/mL. A negative result does not preclude SARS-Cov-2 infection and should not be used as the sole basis for treatment or other patient management decisions. A negative result may occur with  improper specimen  collection/handling, submission of specimen other than nasopharyngeal swab, presence of viral mutation(s) within the areas targeted by this assay, and inadequate number of viral copies(<138 copies/mL). A negative result must be combined with clinical observations, patient history, and epidemiological information. The expected result is Negative.  Fact Sheet for Patients:  bloggercourse.com  Fact Sheet for Healthcare Providers:  seriousbroker.it  This test is no t yet approved or cleared by the [place]  FDA and  has been authorized for detection and/or diagnosis of  SARS-CoV-2 by FDA under an Emergency Use Authorization (EUA). This EUA will remain  in effect (meaning this test can be used) for the duration of the COVID-19 declaration under Section 564(b)(1) of the Act, 21 U.S.C.section 360bbb-3(b)(1), unless the authorization is terminated  or revoked sooner.       Influenza A by PCR NEGATIVE NEGATIVE Final   Influenza B by PCR NEGATIVE NEGATIVE Final    Comment: (NOTE) The Xpert Xpress SARS-CoV-2/FLU/RSV plus assay is intended as an aid in the diagnosis of influenza from Nasopharyngeal swab specimens and should not be used as a sole basis for treatment. Nasal washings and aspirates are unacceptable for Xpert Xpress SARS-CoV-2/FLU/RSV testing.  Fact Sheet for Patients: bloggercourse.com  Fact Sheet for Healthcare Providers: seriousbroker.it  This test is not yet approved or cleared by the [place]  FDA and has been authorized for detection and/or diagnosis of SARS-CoV-2 by FDA under an Emergency Use Authorization (EUA). This EUA will remain in effect (meaning this test can be used) for the duration of the COVID-19 declaration under Section 564(b)(1) of the Act, 21 U.S.C. section 360bbb-3(b)(1), unless the authorization is terminated or revoked.     Resp Syncytial  Virus by PCR NEGATIVE NEGATIVE Final    Comment: (NOTE) Fact Sheet for Patients: bloggercourse.com  Fact Sheet for Healthcare Providers: seriousbroker.it  This test is not yet approved or cleared by the [place]  FDA and has been authorized for detection and/or diagnosis of SARS-CoV-2 by FDA under an Emergency Use Authorization (EUA). This EUA will remain in effect (meaning this test can be used) for the duration of the COVID-19 declaration under Section 564(b)(1) of the Act, 21 U.S.C. section 360bbb-3(b)(1), unless the authorization is terminated or revoked.  Performed at [facility], [address] [place], [place] [address]   Respiratory (~20 pathogens) panel by PCR     Status: None   Collection Time: 06/16/24  6:07 PM   Specimen: Nasopharyngeal Swab; Respiratory  Result Value Ref Range Status   Adenovirus NOT DETECTED NOT DETECTED Final   Coronavirus 229E NOT DETECTED NOT DETECTED Final    Comment: (NOTE) The Coronavirus on the Respiratory Panel, DOES NOT test for the novel  Coronavirus (2019 nCoV)    Coronavirus HKU1 NOT DETECTED NOT DETECTED Final   Coronavirus NL63 NOT DETECTED NOT DETECTED Final   Coronavirus OC43 NOT DETECTED NOT DETECTED Final   Metapneumovirus NOT DETECTED NOT DETECTED Final   Rhinovirus / Enterovirus NOT DETECTED NOT DETECTED Final   Influenza A NOT DETECTED NOT DETECTED Final   Influenza B NOT DETECTED NOT DETECTED Final   Parainfluenza Virus 1 NOT DETECTED NOT DETECTED Final   Parainfluenza Virus 2 NOT DETECTED NOT DETECTED Final   Parainfluenza Virus 3 NOT DETECTED NOT DETECTED Final   Parainfluenza Virus 4 NOT DETECTED NOT DETECTED Final   Respiratory Syncytial Virus NOT DETECTED NOT DETECTED Final   Bordetella pertussis NOT DETECTED NOT DETECTED Final   Bordetella Parapertussis NOT DETECTED NOT DETECTED Final   Chlamydophila pneumoniae NOT DETECTED NOT DETECTED Final    Mycoplasma pneumoniae NOT DETECTED NOT DETECTED Final    Comment: Performed at [facility], [address], [place], [place] [address]  Blood culture (routine x 2)     Status: None   Collection Time: 06/16/24  6:14 PM   Specimen: BLOOD  Result Value Ref Range Status   Specimen Description   Final    BLOOD RIGHT ANTECUBITAL Performed at [facility], [address]  [place], [place] [address]    Special Requests   Final    Blood Culture adequate volume Performed at [facility], [address] [place], [place] [address]    Culture   Final    NO GROWTH 5 DAYS Performed at [facility], [address], [place], [place] [address]    Report Status 06/21/2024 FINAL  Final  Blood culture (routine x 2)     Status: None   Collection Time: 06/16/24  6:19 PM   Specimen: BLOOD  Result Value Ref Range Status   Specimen Description   Final    BLOOD BLOOD RIGHT ARM Performed at [facility], [address] [place], [place] [address]    Special Requests   Final    Blood Culture adequate volume Performed at [facility], [address] [place], [place] [address]    Culture   Final    NO GROWTH 5 DAYS Performed at [facility], [address], [place], [place] [address]    Report Status 06/21/2024 FINAL  Final  Gastrointestinal Panel by PCR , Stool     Status: None   Collection Time: 06/16/24  7:04 PM   Specimen: Stool  Result Value Ref Range Status   Campylobacter species NOT DETECTED NOT DETECTED Final   Plesimonas shigelloides NOT DETECTED NOT DETECTED Final   Salmonella species NOT DETECTED NOT DETECTED Final   Yersinia enterocolitica NOT DETECTED NOT DETECTED Final   Vibrio species NOT DETECTED NOT DETECTED Final   Vibrio cholerae NOT DETECTED NOT DETECTED Final   Enteroaggregative E coli (EAEC) NOT DETECTED NOT DETECTED Final   Enteropathogenic E coli (EPEC) NOT DETECTED NOT  DETECTED Final   Enterotoxigenic E coli (ETEC) NOT DETECTED NOT DETECTED Final   Shiga like toxin producing E coli (STEC) NOT DETECTED NOT DETECTED Final   Shigella/Enteroinvasive E coli (EIEC) NOT DETECTED NOT DETECTED Final   Cryptosporidium NOT DETECTED NOT DETECTED Final   Cyclospora cayetanensis NOT DETECTED NOT DETECTED Final   Entamoeba histolytica NOT DETECTED NOT DETECTED Final   Giardia lamblia NOT DETECTED NOT DETECTED Final   Adenovirus F40/41 NOT DETECTED NOT DETECTED Final   Astrovirus NOT DETECTED NOT DETECTED Final   Norovirus GI/GII NOT DETECTED NOT DETECTED Final   Rotavirus A NOT DETECTED NOT DETECTED Final   Sapovirus (I, II, IV, and V) NOT DETECTED NOT DETECTED Final    Comment: Performed at [facility], [address] [place], [place] [address]  C Difficile Quick Screen w PCR reflex     Status: Abnormal   Collection Time: 06/16/24  7:04 PM   Specimen: Stool  Result Value Ref Range Status   C Diff antigen POSITIVE (A) NEGATIVE Final   C Diff toxin POSITIVE (A) NEGATIVE Final    Comment: CRITICAL RESULT CALLED TO, READ BACK BY AND VERIFIED WITH: RN R. MONTGOMERY [id number] @[phone]  FH    C Diff interpretation Toxin producing C. difficile detected.  Final    Comment: Performed at [facility], [address] [place], [place] [address]     Serology:    Imaging: If present, new imagings (plain films, ct scans, and mri) have been personally visualized and interpreted; radiology reports have been reviewed. Decision making incorporated into the Impression / Recommendations.  1/2 abd pelv ct 1. Question mild wall thickening involving the transverse colon, descending colon, and possibly the sigmoid colon, suggestive of colitis. 2. Coronary artery disease, aortic atherosclerosis. 3. Cholelithiasis.  Constance  ONEIDA Passer, MD [facility] [phone] pager    06/23/2024, 1:50 PM     [1]  Social  History Tobacco Use   Smoking status: Never    Passive exposure: Never   Smokeless tobacco: Never  Vaping Use   Vaping status: Never Used  Substance Use Topics   Alcohol use: Not Currently   Drug use: Never  [2]  Allergies Allergen Reactions   Metformin And Related Nausea Only   Other     Clonidine  patch causes skin irritation    Hydrocodone Itching    Patient able to tolerate when taken with Benadryl.   Oxycodone -Acetaminophen  Itching    Patient able to tolerate when taken with Benadryl.   "

## 2024-06-23 NOTE — Progress Notes (Signed)
 Dialysis called ifd they can get patient but patient is actively having diarrhea and is C. Diff positive, they said they will call again if they can take her tonight. Patient informed and she said she doesn't want any HD tonight. She already had 2 bowel movements since this nurse has started her shift even though imodium  has already been given.

## 2024-06-23 NOTE — Plan of Care (Signed)
 Patient is progressing towards goals of care.     Problem: Education: Goal: Ability to describe self-care measures that may prevent or decrease complications (Diabetes Survival Skills Education) will improve Outcome: Progressing Goal: Individualized Educational Video(s) Outcome: Progressing   Problem: Coping: Goal: Ability to adjust to condition or change in health will improve Outcome: Progressing   Problem: Fluid Volume: Goal: Ability to maintain a balanced intake and output will improve Outcome: Progressing   Problem: Health Behavior/Discharge Planning: Goal: Ability to identify and utilize available resources and services will improve Outcome: Progressing Goal: Ability to manage health-related needs will improve Outcome: Progressing   Problem: Metabolic: Goal: Ability to maintain appropriate glucose levels will improve Outcome: Progressing   Problem: Nutritional: Goal: Maintenance of adequate nutrition will improve Outcome: Progressing Goal: Progress toward achieving an optimal weight will improve Outcome: Progressing   Problem: Skin Integrity: Goal: Risk for impaired skin integrity will decrease Outcome: Progressing   Problem: Tissue Perfusion: Goal: Adequacy of tissue perfusion will improve Outcome: Progressing   Problem: Education: Goal: Knowledge of General Education information will improve Description: Including pain rating scale, medication(s)/side effects and non-pharmacologic comfort measures Outcome: Progressing   Problem: Health Behavior/Discharge Planning: Goal: Ability to manage health-related needs will improve Outcome: Progressing   Problem: Clinical Measurements: Goal: Ability to maintain clinical measurements within normal limits will improve Outcome: Progressing Goal: Will remain free from infection Outcome: Progressing Goal: Diagnostic test results will improve Outcome: Progressing Goal: Respiratory complications will improve Outcome:  Progressing Goal: Cardiovascular complication will be avoided Outcome: Progressing   Problem: Activity: Goal: Risk for activity intolerance will decrease Outcome: Progressing   Problem: Nutrition: Goal: Adequate nutrition will be maintained Outcome: Progressing   Problem: Coping: Goal: Level of anxiety will decrease Outcome: Progressing   Problem: Elimination: Goal: Will not experience complications related to bowel motility Outcome: Progressing Goal: Will not experience complications related to urinary retention Outcome: Progressing   Problem: Pain Managment: Goal: General experience of comfort will improve and/or be controlled Outcome: Progressing   Problem: Safety: Goal: Ability to remain free from injury will improve Outcome: Progressing   Problem: Skin Integrity: Goal: Risk for impaired skin integrity will decrease Outcome: Progressing

## 2024-06-23 NOTE — Progress Notes (Addendum)
 " [facility] Progress Note   Subjective:   Seen in room - dizzy last night so discharge held. She is asking about dialysis today. Tells me she doesn't have the correct supplies at home to do her dialysis tonight or tomorrow since she has been in the hospital so long.   Objective Vitals:   06/22/24 2018 06/23/24 0041 06/23/24 0430 06/23/24 0741  BP:  (!) 100/52 (!) 99/42 (!) 105/57  Pulse: 67 72 67 65  Resp: 15 17 17 18   Temp:  98.6 F (37 C) 98.1 F (36.7 C) 98 F (36.7 C)  TempSrc:  Oral Oral Oral  SpO2: 97%     Weight:      Height:       Physical Exam General: Better appearing woman, NAD. Room air Heart: RRR Lungs: CTAB Abdomen: mild generalized tenderness without guarding, PD cath in place Extremities: no LE edema Dialysis Access: LUE AVF + PD cath  Additional Objective Labs: Basic Metabolic Panel: Recent Labs  Lab 06/19/24 0328 06/22/24 0346 06/23/24 0255  NA 132* 131* 133*  K 3.7 2.9* 3.3*  CL 91* 91* 92*  CO2 21* 21* 27  GLUCOSE 161* 201* 261*  BUN 80* 71* 39*  CREATININE 11.80* 10.00* 6.08*  CALCIUM  8.5* 8.4* 8.5*  PHOS 5.2* 3.3 2.7   Liver Function Tests: Recent Labs  Lab 06/16/24 1303 06/18/24 0343 06/19/24 0328 06/22/24 0346 06/23/24 0255  AST 22 29  --   --   --   ALT <5 <5  --   --   --   ALKPHOS 54 66  --   --   --   BILITOT 0.3 0.3  --   --   --   PROT 7.2 6.8  --   --   --   ALBUMIN 3.9 3.4* 3.3* 3.2* 3.1*   Recent Labs  Lab 06/16/24 1303  LIPASE 30   CBC: Recent Labs  Lab 06/18/24 0343 06/19/24 0328 06/20/24 0243 06/21/24 0216 06/22/24 0346 06/23/24 0255  WBC 22.8* 30.0* 23.5* 15.8* 13.0* 10.7*  NEUTROABS 20.7* 27.9* 21.5*  --   --   --   HGB 12.2 11.4* 10.8* 10.5* 10.2* 10.3*  HCT 35.8* 32.5* 30.4* 29.4* 29.4* 29.9*  MCV 79.9* 77.2* 76.6* 76.2* 76.4* 77.1*  PLT 240 PLATELET CLUMPS NOTED ON SMEAR, COUNT APPEARS ADEQUATE 176 222 235 246   Studies/Results: DG CHEST PORT 1 VIEW Result Date:  06/22/2024 CLINICAL DATA:  Volume excess. EXAM: PORTABLE CHEST 1 VIEW COMPARISON:  06/16/2024 FINDINGS: Similar low volume film. Cardiopericardial silhouette is at upper limits of normal for size. There is pulmonary vascular congestion without overt pulmonary edema. Basilar interstitial opacity is slightly more conspicuous and component of dependent interstitial edema not excluded. No pleural effusion. No acute bony abnormality. IMPRESSION: 1. Low volume film with pulmonary vascular congestion. 2. Basilar interstitial opacity is slightly more conspicuous and component of dependent interstitial edema not excluded. Electronically Signed   By: Camellia Candle M.D.   On: 06/22/2024 08:51   Medications:   aspirin  EC  81 mg Oral Daily   atorvastatin   80 mg Oral QPM   buPROPion   100 mg Oral BID   carvedilol   3.125 mg Oral BID   Chlorhexidine  Gluconate Cloth  6 each Topical Q0600   cholestyramine   4 g Oral BID   ezetimibe   10 mg Oral Daily   fidaxomicin   200 mg Oral BID   heparin   5,000 Units Subcutaneous Q8H   insulin  aspart  0-15 Units Subcutaneous TID WC   insulin  aspart  0-5 Units Subcutaneous QHS   isosorbide  mononitrate  120 mg Oral QHS   pregabalin   25 mg Oral Daily   traZODone   50 mg Oral QHS    Dialysis Orders HHD Nx Stage - MTTF. EDW 102.5kg, 2K bath PD cath placed 06/02/24 - flushed 12/22, flushed 12/29   Assessment/Plan: C. Diff Colitis: + toxin and colitis on imaging. On Dificid . Ongoing diarrhea - finally improving, WBC coming down. ESRD: On home HD, unable to dialyze for few days prior to admit. S/p short HD 1/4 (all she could tolerate), then HD Tue/Thurs this week. Will plan short HD here today and she can resume home HD on Monday. Hypokalemia: K 3.3 - better but still low, due to diarrhea. Will need higher K bath with HD for next week - have reached out to home therapy RN to order. New PD cath: Doubt peritonitis in light of clear cause of symptoms as above. Flushed PD catheter 1/6.  Next flush is due on 06/28/24. Hypertension/volume: BP ok, no edema on exam. Recent CXR showed vascular congestion, 1L UF with HD on 1/9 but dizzy after. No UF today. Hasn't been given isosorbide  or Coreg  this admit (due to low BP) - will d/c for now. Only restart after d/c if needed for BP. Anemia: Hgb 10.3, no ESA for now. Metabolic bone disease: Ca/Phos ok. Home off on binders until eating better.  Addendum 13:49p: Spoke to outpatient HD RN: No option for higher K bath with NxStage HHD as outpatient. Will start her on Kcl 20mEq daily for 1 week - will need close f/u as outpatient with labs - RN will arrange.    Izetta Boehringer, PA-C 06/23/2024, 9:50 AM  [facility]    "

## 2024-06-23 NOTE — Plan of Care (Signed)
  Problem: Coping: Goal: Ability to adjust to condition or change in health will improve Outcome: Progressing   Problem: Fluid Volume: Goal: Ability to maintain a balanced intake and output will improve Outcome: Progressing   Problem: Education: Goal: Knowledge of General Education information will improve Description: Including pain rating scale, medication(s)/side effects and non-pharmacologic comfort measures Outcome: Progressing   

## 2024-06-23 NOTE — Progress Notes (Signed)
 "                        PROGRESS NOTE        PATIENT DETAILS Name: Maria Hudson Age: 64 y.o. Sex: female Date of Birth: April 03, 1961 Admit Date: 06/16/2024 Admitting Physician Camellia Door, DO ERE:Jcalzmz, Aliene, MD  Brief Summary: Patient is a 64 y.o.  female with history of ESRD on HD MWF-presented with nausea, vomiting, diarrhea, subjective fever, 1 episode of syncope (3 days prior to hospitalization)-found to have C. difficile colitis.    Significant events: 1/3>> admit to [facility]  Significant studies: 1/2>> chest x-ray: Central vascular congestion. 1/2>> x-ray left shoulder: No fracture 1/2>> CT abdomen/pelvis: Mild wall thickening of transverse/descending/sigmoid colon. 1/2>> CT head: No acute intracranial abnormality.  Significant microbiology data: 1/2>> COVID/influenza/RSV PCR: Negative 1/2>> stool C. difficile antigen/toxin: Positive 1/2>> GI pathogen panel: Not detected 1/2>> respiratory virus panel: Negative 1/2>> blood culture: Negative  Procedures: None  Consults: None  Subjective: Had almost 15-20 loose stools yesterday.  Feels weak and dizzy when she stands/walks.  She wanted to go home yesterday but obviously that was held as her diarrhea was somewhat worse.  Objective: Vitals: Blood pressure (!) 105/57, pulse 65, temperature 98 F (36.7 C), temperature source Oral, resp. rate 18, height 5' 3 (1.6 m), weight 97.4 kg, SpO2 97%.   Exam: Gen Exam:Alert awake-not in any distress HEENT:atraumatic, normocephalic Chest: B/L clear to auscultation anteriorly CVS:S1S2 regular Abdomen:soft non tender, non distended Extremities:no edema Neurology: Non focal Skin: no rash  Pertinent Labs/Radiology:    Latest Ref Rng & Units 06/23/2024    2:55 AM 06/22/2024    3:46 AM 06/21/2024    2:16 AM  CBC  WBC 4.0 - 10.5 K/uL 10.7  13.0  15.8   Hemoglobin 12.0 - 15.0 g/dL 89.6  89.7  89.4   Hematocrit 36.0 - 46.0 % 29.9  29.4  29.4   Platelets 150 - 400 K/uL 246  235   222     Lab Results  Component Value Date   NA 133 (L) 06/23/2024   K 3.3 (L) 06/23/2024   CL 92 (L) 06/23/2024   CO2 27 06/23/2024      Assessment/Plan: C. difficile colitis Denies any prior antibiotic exposure in the past 3 months After brief/mild improvement-continues to have significant amount of diarrhea.  However exam is benign-leukocytosis has resolved Will get ID opinion Remains on Dificid  As needed Imodium    Syncope Occurred 3 days prior to presentation Either vasovagal (defecation related) or orthostatic Mobilize with PT/OT Continue telemetry monitoring Echo with stable EF.  ESRD on HD MTWF (home hemodialysis) Nephrology following Recently had PD catheter placed for anticipation of initiation of peritoneal dialysis.    Hyponatremia Mild-asymptomatic Likely secondary to impaired free water  excretion Should improve with HD. Follow electrolytes periodically.  Chronic HFpEF Euvolemic Volume removal with HD  CAD No anginal symptoms currently Aspirin /statin/beta-blocker  HTN BP stable Coreg /Imdur   DM-2 (A1c 6.3 on 1/2) CBG stable SSI while inpatient, resume usual outpatient regimen on discharge.  Recent Labs    06/22/24 2109 06/23/24 0740 06/23/24 1144  GLUCAP 118* 208* 167*     HLD Statin/Zetia   Peripheral neuropathy Likely related to DM-2 Lyrica  As needed Ultram   Mood disorder Stable Continue Wellbutrin /trazodone   OSA CPAP nightly  [employer]-refuses blood products  Class 2 Obesity Estimated body mass index is 38.04 kg/m as calculated from the following:   Height as of this encounter: 5' 3 (  1.6 m).   Weight as of this encounter: 97.4 kg.   Code status:   Code Status: Full Code   DVT Prophylaxis: Place TED hose Start: 06/18/24 0829 heparin  injection 5,000 Units Start: 06/17/24 1400 SCDs Start: 06/17/24 1314   Family Communication: None at bedside   Disposition Plan: Status is: Inpatient Remains inpatient  appropriate because: Severity of illness   Planned Discharge Destination:Home   Diet: Diet Order             Diet renal/carb modified with fluid restriction           Diet renal/carb modified with fluid restriction Diet-HS Snack? Nothing; Fluid restriction: 2000 mL Fluid; Room service appropriate? Yes; Fluid consistency: Thin  Diet effective now                     Antimicrobial agents: Anti-infectives (From admission, onward)    Start     Dose/Rate Route Frequency Ordered Stop   06/22/24 0000  fidaxomicin  (DIFICID ) 200 MG TABS tablet        200 mg Oral 2 times daily 06/22/24 1102 06/29/24 2359   06/17/24 2015  ciprofloxacin  (CIPRO ) IVPB 400 mg  Status:  Discontinued        400 mg 200 mL/hr over 60 Minutes Intravenous Every 24 hours 06/16/24 2015 06/16/24 2334   06/17/24 0800  metroNIDAZOLE  (FLAGYL ) IVPB 500 mg  Status:  Discontinued        500 mg 100 mL/hr over 60 Minutes Intravenous Every 12 hours 06/16/24 2027 06/16/24 2334   06/17/24 0200  fidaxomicin  (DIFICID ) tablet 200 mg        200 mg Oral 2 times daily 06/16/24 2305 06/26/24 2159   06/17/24 0000  cefTRIAXone  (ROCEPHIN ) 2 g in sodium chloride  0.9 % 100 mL IVPB  Status:  Discontinued        2 g 200 mL/hr over 30 Minutes Intravenous Every 24 hours 06/16/24 2027 06/16/24 2334   06/17/24 0000  metroNIDAZOLE  (FLAGYL ) IVPB 500 mg  Status:  Discontinued        500 mg 100 mL/hr over 60 Minutes Intravenous Every 12 hours 06/16/24 2027 06/16/24 2027   06/16/24 2015  ciprofloxacin  (CIPRO ) IVPB 400 mg  Status:  Discontinued        400 mg 200 mL/hr over 60 Minutes Intravenous Every 12 hours 06/16/24 2000 06/16/24 2015   06/16/24 2015  metroNIDAZOLE  (FLAGYL ) IVPB 500 mg  Status:  Discontinued        500 mg 100 mL/hr over 60 Minutes Intravenous Every 12 hours 06/16/24 2000 06/16/24 2051        MEDICATIONS: Scheduled Meds:  aspirin  EC  81 mg Oral Daily   atorvastatin   80 mg Oral QPM   buPROPion   100 mg Oral BID    Chlorhexidine  Gluconate Cloth  6 each Topical Q0600   ezetimibe   10 mg Oral Daily   fidaxomicin   200 mg Oral BID   heparin   5,000 Units Subcutaneous Q8H   insulin  aspart  0-15 Units Subcutaneous TID WC   insulin  aspart  0-5 Units Subcutaneous QHS   pregabalin   25 mg Oral Daily   traZODone   50 mg Oral QHS   Continuous Infusions:   PRN Meds:.acetaminophen , loperamide , ondansetron  **OR** ondansetron  (ZOFRAN ) IV, traMADol    I have personally reviewed following labs and imaging studies  LABORATORY DATA: CBC: Recent Labs  Lab 06/18/24 0343 06/19/24 0328 06/20/24 0243 06/21/24 0216 06/22/24 0346 06/23/24 0255  WBC 22.8* 30.0* 23.5* 15.8*  13.0* 10.7*  NEUTROABS 20.7* 27.9* 21.5*  --   --   --   HGB 12.2 11.4* 10.8* 10.5* 10.2* 10.3*  HCT 35.8* 32.5* 30.4* 29.4* 29.4* 29.9*  MCV 79.9* 77.2* 76.6* 76.2* 76.4* 77.1*  PLT 240 PLATELET CLUMPS NOTED ON SMEAR, COUNT APPEARS ADEQUATE 176 222 235 246    Basic Metabolic Panel: Recent Labs  Lab 06/16/24 2006 06/17/24 0039 06/17/24 0531 06/18/24 0343 06/19/24 0328 06/22/24 0346 06/23/24 0255  NA  --    < > 134* 130* 132* 131* 133*  K  --    < > 3.0* 4.5 3.7 2.9* 3.3*  CL  --    < > 93* 91* 91* 91* 92*  CO2  --    < > 20* 14* 21* 21* 27  GLUCOSE  --    < > 130* 125* 161* 201* 261*  BUN  --    < > 106* 115* 80* 71* 39*  CREATININE  --    < > 11.90* 15.00* 11.80* 10.00* 6.08*  CALCIUM   --    < > 8.4* 8.8* 8.5* 8.4* 8.5*  MG 2.1  --   --   --   --   --   --   PHOS  --   --   --   --  5.2* 3.3 2.7   < > = values in this interval not displayed.    GFR: Estimated Creatinine Clearance: 10.7 mL/min (A) (by C-G formula based on SCr of 6.08 mg/dL (H)).  Liver Function Tests: Recent Labs  Lab 06/16/24 1303 06/18/24 0343 06/19/24 0328 06/22/24 0346 06/23/24 0255  AST 22 29  --   --   --   ALT <5 <5  --   --   --   ALKPHOS 54 66  --   --   --   BILITOT 0.3 0.3  --   --   --   PROT 7.2 6.8  --   --   --   ALBUMIN 3.9 3.4* 3.3*  3.2* 3.1*   Recent Labs  Lab 06/16/24 1303  LIPASE 30   No results for input(s): AMMONIA in the last 168 hours.  Coagulation Profile: No results for input(s): INR, PROTIME in the last 168 hours.  Cardiac Enzymes: No results for input(s): CKTOTAL, CKMB, CKMBINDEX, TROPONINI in the last 168 hours.  BNP (last 3 results) No results for input(s): PROBNP in the last 8760 hours.  Lipid Profile: No results for input(s): CHOL, HDL, LDLCALC, TRIG, CHOLHDL, LDLDIRECT in the last 72 hours.  Thyroid  Function Tests: No results for input(s): TSH, T4TOTAL, FREET4, T3FREE, THYROIDAB in the last 72 hours.  Anemia Panel: No results for input(s): VITAMINB12, FOLATE, FERRITIN, TIBC, IRON, RETICCTPCT in the last 72 hours.  Urine analysis:    Component Value Date/Time   COLORURINE YELLOW 10/12/2023 0735   APPEARANCEUR HAZY (A) 10/12/2023 0735   LABSPEC 1.013 10/12/2023 0735   PHURINE 5.0 10/12/2023 0735   GLUCOSEU 150 (A) 10/12/2023 0735   HGBUR SMALL (A) 10/12/2023 0735   BILIRUBINUR NEGATIVE 10/12/2023 0735   KETONESUR NEGATIVE 10/12/2023 0735   PROTEINUR 100 (A) 10/12/2023 0735   NITRITE NEGATIVE 10/12/2023 0735   LEUKOCYTESUR SMALL (A) 10/12/2023 0735    Sepsis Labs: Lactic Acid, Venous    Component Value Date/Time   LATICACIDVEN 0.8 06/16/2024 2006    MICROBIOLOGY: Recent Results (from the past 240 hours)  Resp panel by RT-PCR (RSV, Flu A&B, Covid) Anterior Nasal Swab  Status: None   Collection Time: 06/16/24  1:04 PM   Specimen: Anterior Nasal Swab  Result Value Ref Range Status   SARS Coronavirus 2 by RT PCR NEGATIVE NEGATIVE Final    Comment: (NOTE) SARS-CoV-2 target nucleic acids are NOT DETECTED.  The SARS-CoV-2 RNA is generally detectable in upper respiratory specimens during the acute phase of infection. The lowest concentration of SARS-CoV-2 viral copies this assay can detect is 138 copies/mL. A negative result  does not preclude SARS-Cov-2 infection and should not be used as the sole basis for treatment or other patient management decisions. A negative result may occur with  improper specimen collection/handling, submission of specimen other than nasopharyngeal swab, presence of viral mutation(s) within the areas targeted by this assay, and inadequate number of viral copies(<138 copies/mL). A negative result must be combined with clinical observations, patient history, and epidemiological information. The expected result is Negative.  Fact Sheet for Patients:  bloggercourse.com  Fact Sheet for Healthcare Providers:  seriousbroker.it  This test is no t yet approved or cleared by the [place]  FDA and  has been authorized for detection and/or diagnosis of SARS-CoV-2 by FDA under an Emergency Use Authorization (EUA). This EUA will remain  in effect (meaning this test can be used) for the duration of the COVID-19 declaration under Section 564(b)(1) of the Act, 21 U.S.C.section 360bbb-3(b)(1), unless the authorization is terminated  or revoked sooner.       Influenza A by PCR NEGATIVE NEGATIVE Final   Influenza B by PCR NEGATIVE NEGATIVE Final    Comment: (NOTE) The Xpert Xpress SARS-CoV-2/FLU/RSV plus assay is intended as an aid in the diagnosis of influenza from Nasopharyngeal swab specimens and should not be used as a sole basis for treatment. Nasal washings and aspirates are unacceptable for Xpert Xpress SARS-CoV-2/FLU/RSV testing.  Fact Sheet for Patients: bloggercourse.com  Fact Sheet for Healthcare Providers: seriousbroker.it  This test is not yet approved or cleared by the [place]  FDA and has been authorized for detection and/or diagnosis of SARS-CoV-2 by FDA under an Emergency Use Authorization (EUA). This EUA will remain in effect (meaning this test can be used) for  the duration of the COVID-19 declaration under Section 564(b)(1) of the Act, 21 U.S.C. section 360bbb-3(b)(1), unless the authorization is terminated or revoked.     Resp Syncytial Virus by PCR NEGATIVE NEGATIVE Final    Comment: (NOTE) Fact Sheet for Patients: bloggercourse.com  Fact Sheet for Healthcare Providers: seriousbroker.it  This test is not yet approved or cleared by the [place]  FDA and has been authorized for detection and/or diagnosis of SARS-CoV-2 by FDA under an Emergency Use Authorization (EUA). This EUA will remain in effect (meaning this test can be used) for the duration of the COVID-19 declaration under Section 564(b)(1) of the Act, 21 U.S.C. section 360bbb-3(b)(1), unless the authorization is terminated or revoked.  Performed at [facility], [address] [place], [place] [address]   Respiratory (~20 pathogens) panel by PCR     Status: None   Collection Time: 06/16/24  6:07 PM   Specimen: Nasopharyngeal Swab; Respiratory  Result Value Ref Range Status   Adenovirus NOT DETECTED NOT DETECTED Final   Coronavirus 229E NOT DETECTED NOT DETECTED Final    Comment: (NOTE) The Coronavirus on the Respiratory Panel, DOES NOT test for the novel  Coronavirus (2019 nCoV)    Coronavirus HKU1 NOT DETECTED NOT DETECTED Final   Coronavirus NL63 NOT DETECTED NOT DETECTED Final   Coronavirus OC43 NOT DETECTED  NOT DETECTED Final   Metapneumovirus NOT DETECTED NOT DETECTED Final   Rhinovirus / Enterovirus NOT DETECTED NOT DETECTED Final   Influenza A NOT DETECTED NOT DETECTED Final   Influenza B NOT DETECTED NOT DETECTED Final   Parainfluenza Virus 1 NOT DETECTED NOT DETECTED Final   Parainfluenza Virus 2 NOT DETECTED NOT DETECTED Final   Parainfluenza Virus 3 NOT DETECTED NOT DETECTED Final   Parainfluenza Virus 4 NOT DETECTED NOT DETECTED Final   Respiratory Syncytial Virus NOT DETECTED NOT  DETECTED Final   Bordetella pertussis NOT DETECTED NOT DETECTED Final   Bordetella Parapertussis NOT DETECTED NOT DETECTED Final   Chlamydophila pneumoniae NOT DETECTED NOT DETECTED Final   Mycoplasma pneumoniae NOT DETECTED NOT DETECTED Final    Comment: Performed at [facility], [address], [place], [place] [address]  Blood culture (routine x 2)     Status: None   Collection Time: 06/16/24  6:14 PM   Specimen: BLOOD  Result Value Ref Range Status   Specimen Description   Final    BLOOD RIGHT ANTECUBITAL Performed at [facility], [address], [place], [place] [address]    Special Requests   Final    Blood Culture adequate volume Performed at [facility], [address] [place], [place] [address]    Culture   Final    NO GROWTH 5 DAYS Performed at [facility], [address] [place], [place] [address]    Report Status 06/21/2024 FINAL  Final  Blood culture (routine x 2)     Status: None   Collection Time: 06/16/24  6:19 PM   Specimen: BLOOD  Result Value Ref Range Status   Specimen Description   Final    BLOOD BLOOD RIGHT ARM Performed at [facility], [address] [place], [place] [address]    Special Requests   Final    Blood Culture adequate volume Performed at [facility], [address], [place], [place] [address]    Culture   Final    NO GROWTH 5 DAYS Performed at [facility], [address], [place], [place] [address]    Report Status 06/21/2024 FINAL  Final  Gastrointestinal Panel by PCR , Stool     Status: None   Collection Time: 06/16/24  7:04 PM   Specimen: Stool  Result Value Ref Range Status   Campylobacter species NOT DETECTED NOT DETECTED Final   Plesimonas shigelloides NOT DETECTED NOT DETECTED Final   Salmonella species NOT DETECTED NOT DETECTED Final   Yersinia enterocolitica NOT DETECTED NOT DETECTED Final   Vibrio species NOT DETECTED  NOT DETECTED Final   Vibrio cholerae NOT DETECTED NOT DETECTED Final   Enteroaggregative E coli (EAEC) NOT DETECTED NOT DETECTED Final   Enteropathogenic E coli (EPEC) NOT DETECTED NOT DETECTED Final   Enterotoxigenic E coli (ETEC) NOT DETECTED NOT DETECTED Final   Shiga like toxin producing E coli (STEC) NOT DETECTED NOT DETECTED Final   Shigella/Enteroinvasive E coli (EIEC) NOT DETECTED NOT DETECTED Final   Cryptosporidium NOT DETECTED NOT DETECTED Final   Cyclospora cayetanensis NOT DETECTED NOT DETECTED Final   Entamoeba histolytica NOT DETECTED NOT DETECTED Final   Giardia lamblia NOT DETECTED NOT DETECTED Final   Adenovirus F40/41 NOT DETECTED NOT DETECTED Final   Astrovirus NOT DETECTED NOT DETECTED Final   Norovirus GI/GII NOT DETECTED NOT DETECTED Final   Rotavirus A NOT DETECTED NOT DETECTED Final   Sapovirus (I, II, IV,  and V) NOT DETECTED NOT DETECTED Final    Comment: Performed at [facility], [address] [place], [place] [address]  C Difficile Quick Screen w PCR reflex     Status: Abnormal   Collection Time: 06/16/24  7:04 PM   Specimen: Stool  Result Value Ref Range Status   C Diff antigen POSITIVE (A) NEGATIVE Final   C Diff toxin POSITIVE (A) NEGATIVE Final    Comment: CRITICAL RESULT CALLED TO, READ BACK BY AND VERIFIED WITH: RN R. MONTGOMERY [id number] @2304  FH    C Diff interpretation Toxin producing C. difficile detected.  Final    Comment: Performed at [facility], [address] [place], [place] [address]    RADIOLOGY STUDIES/RESULTS: DG CHEST PORT 1 VIEW Result Date: 06/22/2024 CLINICAL DATA:  Volume excess. EXAM: PORTABLE CHEST 1 VIEW COMPARISON:  06/16/2024 FINDINGS: Similar low volume film. Cardiopericardial silhouette is at upper limits of normal for size. There is pulmonary vascular congestion without overt pulmonary edema. Basilar interstitial opacity is slightly more conspicuous and component of dependent interstitial edema not  excluded. No pleural effusion. No acute bony abnormality. IMPRESSION: 1. Low volume film with pulmonary vascular congestion. 2. Basilar interstitial opacity is slightly more conspicuous and component of dependent interstitial edema not excluded. Electronically Signed   By: Camellia Candle M.D.   On: 06/22/2024 08:51      LOS: 6 days   Donalda Applebaum, MD  [facility]    To contact the attending provider between 7A-7P or the covering provider during after hours 7P-7A, please log into the web site [email or web address] and access using universal [place] password for that web site. If you do not have the password, please call the hospital operator.  06/23/2024, 11:49 AM    "

## 2024-06-23 NOTE — Progress Notes (Addendum)
 Noted that pt is stating she does not have at home HD supplies. Contacted [facility] home therapies dept yesterday, spoke with brittany who stated she should have everything she needed.   Followed up today after reading nephrology note, informed them pt is stating she does not have supplies to run tonight or tomorrow. Made them aware of this and that follow up may need to be had with this pt.  Lavanda Lilyth Lawyer Dialysis Navigator [phone]  Addendum 1116am Noted that specific supplies needed is higher K bath, informed clinic, [facility] renal also informed clinic of this need.

## 2024-06-24 ENCOUNTER — Other Ambulatory Visit (HOSPITAL_COMMUNITY): Payer: Self-pay

## 2024-06-24 DIAGNOSIS — I5032 Chronic diastolic (congestive) heart failure: Secondary | ICD-10-CM | POA: Diagnosis not present

## 2024-06-24 DIAGNOSIS — A0472 Enterocolitis due to Clostridium difficile, not specified as recurrent: Secondary | ICD-10-CM | POA: Diagnosis not present

## 2024-06-24 DIAGNOSIS — Z992 Dependence on renal dialysis: Secondary | ICD-10-CM | POA: Diagnosis not present

## 2024-06-24 DIAGNOSIS — E1142 Type 2 diabetes mellitus with diabetic polyneuropathy: Secondary | ICD-10-CM | POA: Diagnosis not present

## 2024-06-24 DIAGNOSIS — N186 End stage renal disease: Secondary | ICD-10-CM | POA: Diagnosis not present

## 2024-06-24 LAB — CBC
HCT: 28.4 % — ABNORMAL LOW (ref 36.0–46.0)
Hemoglobin: 9.7 g/dL — ABNORMAL LOW (ref 12.0–15.0)
MCH: 26.4 pg (ref 26.0–34.0)
MCHC: 34.2 g/dL (ref 30.0–36.0)
MCV: 77.2 fL — ABNORMAL LOW (ref 80.0–100.0)
Platelets: 297 K/uL (ref 150–400)
RBC: 3.68 MIL/uL — ABNORMAL LOW (ref 3.87–5.11)
RDW: 17.1 % — ABNORMAL HIGH (ref 11.5–15.5)
WBC: 9.4 K/uL (ref 4.0–10.5)
nRBC: 0 % (ref 0.0–0.2)

## 2024-06-24 LAB — RENAL FUNCTION PANEL
Albumin: 3.1 g/dL — ABNORMAL LOW (ref 3.5–5.0)
Anion gap: 18 — ABNORMAL HIGH (ref 5–15)
BUN: 58 mg/dL — ABNORMAL HIGH (ref 8–23)
CO2: 18 mmol/L — ABNORMAL LOW (ref 22–32)
Calcium: 8.6 mg/dL — ABNORMAL LOW (ref 8.9–10.3)
Chloride: 95 mmol/L — ABNORMAL LOW (ref 98–111)
Creatinine, Ser: 8.31 mg/dL — ABNORMAL HIGH (ref 0.44–1.00)
GFR, Estimated: 5 mL/min — ABNORMAL LOW
Glucose, Bld: 149 mg/dL — ABNORMAL HIGH (ref 70–99)
Phosphorus: 3.4 mg/dL (ref 2.5–4.6)
Potassium: 2.7 mmol/L — CL (ref 3.5–5.1)
Sodium: 130 mmol/L — ABNORMAL LOW (ref 135–145)

## 2024-06-24 LAB — GLUCOSE, CAPILLARY
Glucose-Capillary: 136 mg/dL — ABNORMAL HIGH (ref 70–99)
Glucose-Capillary: 177 mg/dL — ABNORMAL HIGH (ref 70–99)
Glucose-Capillary: 143 mg/dL — ABNORMAL HIGH (ref 70–99)
Glucose-Capillary: 159 mg/dL — ABNORMAL HIGH (ref 70–99)

## 2024-06-24 LAB — BASIC METABOLIC PANEL WITH GFR
Anion gap: 14 (ref 5–15)
Anion gap: 13 (ref 5–15)
BUN: 30 mg/dL — ABNORMAL HIGH (ref 8–23)
BUN: 33 mg/dL — ABNORMAL HIGH (ref 8–23)
CO2: 28 mmol/L (ref 22–32)
CO2: 28 mmol/L (ref 22–32)
Calcium: 8.9 mg/dL (ref 8.9–10.3)
Calcium: 8.8 mg/dL — ABNORMAL LOW (ref 8.9–10.3)
Chloride: 93 mmol/L — ABNORMAL LOW (ref 98–111)
Chloride: 93 mmol/L — ABNORMAL LOW (ref 98–111)
Creatinine, Ser: 4.86 mg/dL — ABNORMAL HIGH (ref 0.44–1.00)
Creatinine, Ser: 5.3 mg/dL — ABNORMAL HIGH (ref 0.44–1.00)
GFR, Estimated: 10 mL/min — ABNORMAL LOW
GFR, Estimated: 9 mL/min — ABNORMAL LOW
Glucose, Bld: 131 mg/dL — ABNORMAL HIGH (ref 70–99)
Glucose, Bld: 172 mg/dL — ABNORMAL HIGH (ref 70–99)
Potassium: 3 mmol/L — ABNORMAL LOW (ref 3.5–5.1)
Potassium: 3 mmol/L — ABNORMAL LOW (ref 3.5–5.1)
Sodium: 135 mmol/L (ref 135–145)
Sodium: 133 mmol/L — ABNORMAL LOW (ref 135–145)

## 2024-06-24 MED ORDER — POTASSIUM CHLORIDE 10 MEQ/100ML IV SOLN
10.0000 meq | INTRAVENOUS | Status: DC
  Filled 2024-06-24: qty 100

## 2024-06-24 MED ORDER — POTASSIUM CHLORIDE CRYS ER 20 MEQ PO TBCR
20.0000 meq | EXTENDED_RELEASE_TABLET | Freq: Once | ORAL | Status: AC
  Administered 2024-06-24: 20 meq via ORAL
  Filled 2024-06-24: qty 1

## 2024-06-24 NOTE — Progress Notes (Signed)
" °   06/24/24 0645  Vitals  Temp 98.2 F (36.8 C)  BP (!) 115/55  Pulse Rate 61  Oxygen  Therapy  SpO2 100 %  During Treatment Monitoring  Blood Flow Rate (mL/min) 0 mL/min  Arterial Pressure (mmHg) 24.24 mmHg  Venous Pressure (mmHg) -33.33 mmHg  TMP (mmHg) 20.4 mmHg  Ultrafiltration Rate (mL/min) 367 mL/min  Dialysate Flow Rate (mL/min) 300 ml/min  Dialysate Potassium Concentration 3  Dialysate Calcium  Concentration 2.5  Duration of HD Treatment -hour(s) 2.39 hour(s)  Cumulative Fluid Removed (mL) per Treatment  7.78  HD Safety Checks Performed Yes  Intra-Hemodialysis Comments Tx completed  Post Treatment  Dialyzer Clearance Lightly streaked  Liters Processed 50  Fluid Removed (mL) 0 mL  Tolerated HD Treatment Yes  Post-Hemodialysis Comments pt stable  AVG/AVF Arterial Site Held (minutes) 10 minutes  AVG/AVF Venous Site Held (minutes) 10 minutes  Fistula / Graft Left Upper arm Arteriovenous fistula  Placement Date/Time: 12/09/21 1058   Placed prior to admission: No  Orientation: Left  Access Location: Upper arm  Access Type: Arteriovenous fistula  Site Condition No complications  Fistula / Graft Assessment Present;Thrill;Bruit  Status Deaccessed  Needle Size 15  Drainage Description None    "

## 2024-06-24 NOTE — Plan of Care (Signed)
" °  Problem: Metabolic: Goal: Ability to maintain appropriate glucose levels will improve Outcome: Not Progressing   Problem: Nutritional: Goal: Maintenance of adequate nutrition will improve Outcome: Not Progressing Goal: Progress toward achieving an optimal weight will improve Outcome: Not Progressing   Problem: Clinical Measurements: Goal: Will remain free from infection Outcome: Not Progressing   Problem: Elimination: Goal: Will not experience complications related to bowel motility Outcome: Not Progressing   "

## 2024-06-24 NOTE — Progress Notes (Signed)
 84 Dr Charlton, informed that potassium Iv cannot be infused right now as patient is having HD, per HD nurse Janetta at bedside, patient can have PO potassium now.

## 2024-06-24 NOTE — Progress Notes (Signed)
 " [facility] Progress Note   Subjective:   Patient seen and examined at bedside.  Just completed dialysis, tolerated well.  Reports heartburn that started after taking potassium supplement during treatment. Continues to have diarrhea.  Had BM during HD but did not want nurse to help her get cleaned up.  States she will clean herself up when we are finished talking. Hoping to go home soon. States she needs to change the filter on her dialysis machine when she gets home.   Objective Vitals:   06/24/24 0615 06/24/24 0630 06/24/24 0645 06/24/24 0834  BP: (!) 117/43 (!) 117/52 (!) 115/55 (!) 102/51  Pulse: 60 60 61 65  Resp:    16  Temp:   98.2 F (36.8 C) 98 F (36.7 C)  TempSrc:    Oral  SpO2: 96% 100% 100% 98%  Weight:      Height:       Physical Exam General:well appearing, pleasant female in NAD Heart:RRR, no mrg Lungs:CTAB Abdomen:soft, +tenderness, +PD catheter in place Extremities:trace LE edema Dialysis Access: LU AVF +b/t   Filed Weights   06/20/24 1437 06/20/24 1830 06/22/24 2013  Weight: 100.6 kg 100.6 kg 97.4 kg    Intake/Output Summary (Last 24 hours) at 06/24/2024 1652 Last data filed at 06/24/2024 0645 Gross per 24 hour  Intake --  Output 0 ml  Net 0 ml    Additional Objective Labs: Basic Metabolic Panel: Recent Labs  Lab 06/22/24 0346 06/23/24 0255 06/24/24 0335 06/24/24 0822 06/24/24 1142  NA 131* 133* 130* 135 133*  K 2.9* 3.3* 2.7* 3.0* 3.0*  CL 91* 92* 95* 93* 93*  CO2 21* 27 18* 28 28  GLUCOSE 201* 261* 149* 131* 172*  BUN 71* 39* 58* 30* 33*  CREATININE 10.00* 6.08* 8.31* 4.86* 5.30*  CALCIUM  8.4* 8.5* 8.6* 8.9 8.8*  PHOS 3.3 2.7 3.4  --   --    Liver Function Tests: Recent Labs  Lab 06/18/24 0343 06/19/24 0328 06/22/24 0346 06/23/24 0255 06/24/24 0335  AST 29  --   --   --   --   ALT <5  --   --   --   --   ALKPHOS 66  --   --   --   --   BILITOT 0.3  --   --   --   --   PROT 6.8  --   --   --   --   ALBUMIN  3.4*   < > 3.2* 3.1* 3.1*   < > = values in this interval not displayed.   CBC: Recent Labs  Lab 06/18/24 0343 06/19/24 0328 06/20/24 0243 06/21/24 0216 06/22/24 0346 06/23/24 0255 06/24/24 0335  WBC 22.8* 30.0* 23.5* 15.8* 13.0* 10.7* 9.4  NEUTROABS 20.7* 27.9* 21.5*  --   --   --   --   HGB 12.2 11.4* 10.8* 10.5* 10.2* 10.3* 9.7*  HCT 35.8* 32.5* 30.4* 29.4* 29.4* 29.9* 28.4*  MCV 79.9* 77.2* 76.6* 76.2* 76.4* 77.1* 77.2*  PLT 240 PLATELET CLUMPS NOTED ON SMEAR, COUNT APPEARS ADEQUATE 176 222 235 246 297    Medications:   aspirin  EC  81 mg Oral Daily   atorvastatin   80 mg Oral QPM   buPROPion   100 mg Oral BID   Chlorhexidine  Gluconate Cloth  6 each Topical Q0600   ezetimibe   10 mg Oral Daily   fidaxomicin   200 mg Oral BID   heparin   5,000 Units Subcutaneous Q8H   insulin   aspart  0-15 Units Subcutaneous TID WC   insulin  aspart  0-5 Units Subcutaneous QHS   potassium chloride   20 mEq Oral Daily   pregabalin   25 mg Oral Daily   traZODone   50 mg Oral QHS    Dialysis Orders: HHD Nx Stage - MTTF. EDW 102.5kg, 2K bath PD cath placed 06/02/24 - flushed 12/22, flushed 12/29   Assessment/Plan: C. Diff Colitis: + toxin and colitis on imaging. On Dificid . Ongoing diarrhea - finally improving, WBC coming down. ESRD: On home HD, unable to dialyze for few days prior to admit. S/p short HD 1/4 (all she could tolerate), HD in early AM.  Plan for next HD on Monday. Hypokalemia: K 3.0, 2.9 prior to HD - better but still low, due to diarrhea. Increased K bath not available for HHD.  Plan for 20mEq KCl every day for 1 week with close f/u with home therapies.   New PD cath: Doubt peritonitis in light of clear cause of symptoms as above. Flushed PD catheter 1/6. Next flush is due on 06/28/24. Hypertension/volume: BP ok, no edema on exam. Recent CXR showed vascular congestion, 1L UF with HD on 1/9 but dizzy after. No UF today. Hasn't been given isosorbide  or Coreg  this admit (due to low BP) -  will d/c for now. Only restart after d/c if needed for BP. Anemia: Hgb 9.7 no ESA for now. Continue to monitor. Metabolic bone disease: Ca/Phos ok. Home off on binders until eating better.    Manuelita Labella, PA-C [facility] 06/24/2024,4:52 PM  LOS: 7 days    "

## 2024-06-24 NOTE — Progress Notes (Addendum)
 "                        PROGRESS NOTE        PATIENT DETAILS Name: Maria Hudson Age: 64 y.o. Sex: female Date of Birth: [birth year] Admit Date: 06/16/2024 Admitting Physician Camellia Door, DO ERE:Jcalzmz, Aliene, MD  Brief Summary: Patient is a 64 y.o.  female with history of ESRD on HD MWF-presented with nausea, vomiting, diarrhea, subjective fever, 1 episode of syncope (3 days prior to hospitalization)-found to have C. difficile colitis.    Significant events: 1/3>> admit to [facility]  Significant studies: 1/2>> chest x-ray: Central vascular congestion. 1/2>> x-ray left shoulder: No fracture 1/2>> CT abdomen/pelvis: Mild wall thickening of transverse/descending/sigmoid colon. 1/2>> CT head: No acute intracranial abnormality.  Significant microbiology data: 1/2>> COVID/influenza/RSV PCR: Negative 1/2>> stool C. difficile antigen/toxin: Positive 1/2>> GI pathogen panel: Not detected 1/2>> respiratory virus panel: Negative 1/2>> blood culture: Negative  Procedures: None  Consults: ID  Subjective: Diarrhea has somewhat slowed down-no other issues overnight.  Objective: Vitals: Blood pressure (!) 102/51, pulse 65, temperature 98 F (36.7 C), temperature source Oral, resp. rate 16, height 5' 3 (1.6 m), weight 97.4 kg, SpO2 98%.   Exam: Gen Exam:Alert awake-not in any distress HEENT:atraumatic, normocephalic Chest: B/L clear to auscultation anteriorly CVS:S1S2 regular Abdomen:soft non tender, non distended Extremities:no edema Neurology: Non focal Skin: no rash  Pertinent Labs/Radiology:    Latest Ref Rng & Units 06/24/2024    3:35 AM 06/23/2024    2:55 AM 06/22/2024    3:46 AM  CBC  WBC 4.0 - 10.5 K/uL 9.4  10.7  13.0   Hemoglobin 12.0 - 15.0 g/dL 9.7  89.6  89.7   Hematocrit 36.0 - 46.0 % 28.4  29.9  29.4   Platelets 150 - 400 K/uL 297  246  235     Lab Results  Component Value Date   NA 135 06/24/2024   K 3.0 (L) 06/24/2024   CL 93 (L) 06/24/2024   CO2 28  06/24/2024      Assessment/Plan: C. difficile colitis Denies any prior prolonged antibiotic exposure in the past 3 months After a brief period of improvement-had worsening diarrhea-ID consulted on 1/9-continue Dificid . Thankfully has started to improve -Will observe for another 24 hours-if diarrhea continues to slow down-likely home on 1/11. As needed Imodium .   Syncope Occurred 3 days prior to presentation Either vasovagal (defecation related) or orthostatic Mobilize with PT/OT Continue telemetry monitoring Echo with stable EF.  ESRD on HD MTWF (home hemodialysis) Nephrology following Recently had PD catheter placed for anticipation of initiation of peritoneal dialysis.    Hyponatremia Mild-asymptomatic Likely secondary to impaired free water  excretion Should improve with HD. Follow electrolytes periodically.  Hypokalemia Secondary to GI loss Replete/recheck.  Chronic HFpEF Euvolemic Volume removal with HD  CAD No anginal symptoms currently Aspirin /statin/beta-blocker  HTN BP stable Coreg /Imdur   DM-2 (A1c 6.3 on 1/2) CBG stable SSI while inpatient, resume usual outpatient regimen on discharge.  Recent Labs    06/23/24 1558 06/23/24 2030 06/24/24 0833  GLUCAP 232* 137* 136*     HLD Statin/Zetia   Peripheral neuropathy Likely related to DM-2 Lyrica  As needed Ultram   Mood disorder Stable Continue Wellbutrin /trazodone   OSA CPAP nightly  [employer]-refuses blood products  Class 2 Obesity Estimated body mass index is 38.04 kg/m as calculated from the following:   Height as of this encounter: 5' 3 (1.6 m).   Weight as of this encounter:  97.4 kg.   Code status:   Code Status: Full Code   DVT Prophylaxis: Place TED hose Start: 06/18/24 0829 heparin  injection 5,000 Units Start: 06/17/24 1400 SCDs Start: 06/17/24 1314   Family Communication: None at bedside   Disposition Plan: Status is: Inpatient Remains inpatient appropriate  because: Severity of illness   Planned Discharge Destination:Home   Diet: Diet Order             Diet renal/carb modified with fluid restriction           Diet renal/carb modified with fluid restriction Diet-HS Snack? Nothing; Fluid restriction: 2000 mL Fluid; Room service appropriate? Yes; Fluid consistency: Thin  Diet effective now                     Antimicrobial agents: Anti-infectives (From admission, onward)    Start     Dose/Rate Route Frequency Ordered Stop   06/22/24 0000  fidaxomicin  (DIFICID ) 200 MG TABS tablet        200 mg Oral 2 times daily 06/22/24 1102 06/29/24 2359   06/17/24 2015  ciprofloxacin  (CIPRO ) IVPB 400 mg  Status:  Discontinued        400 mg 200 mL/hr over 60 Minutes Intravenous Every 24 hours 06/16/24 2015 06/16/24 2334   06/17/24 0800  metroNIDAZOLE  (FLAGYL ) IVPB 500 mg  Status:  Discontinued        500 mg 100 mL/hr over 60 Minutes Intravenous Every 12 hours 06/16/24 2027 06/16/24 2334   06/17/24 0200  fidaxomicin  (DIFICID ) tablet 200 mg        200 mg Oral 2 times daily 06/16/24 2305 06/26/24 2159   06/17/24 0000  cefTRIAXone  (ROCEPHIN ) 2 g in sodium chloride  0.9 % 100 mL IVPB  Status:  Discontinued        2 g 200 mL/hr over 30 Minutes Intravenous Every 24 hours 06/16/24 2027 06/16/24 2334   06/17/24 0000  metroNIDAZOLE  (FLAGYL ) IVPB 500 mg  Status:  Discontinued        500 mg 100 mL/hr over 60 Minutes Intravenous Every 12 hours 06/16/24 2027 06/16/24 2027   06/16/24 2015  ciprofloxacin  (CIPRO ) IVPB 400 mg  Status:  Discontinued        400 mg 200 mL/hr over 60 Minutes Intravenous Every 12 hours 06/16/24 2000 06/16/24 2015   06/16/24 2015  metroNIDAZOLE  (FLAGYL ) IVPB 500 mg  Status:  Discontinued        500 mg 100 mL/hr over 60 Minutes Intravenous Every 12 hours 06/16/24 2000 06/16/24 2051        MEDICATIONS: Scheduled Meds:  aspirin  EC  81 mg Oral Daily   atorvastatin   80 mg Oral QPM   buPROPion   100 mg Oral BID   Chlorhexidine   Gluconate Cloth  6 each Topical Q0600   ezetimibe   10 mg Oral Daily   fidaxomicin   200 mg Oral BID   heparin   5,000 Units Subcutaneous Q8H   insulin  aspart  0-15 Units Subcutaneous TID WC   insulin  aspart  0-5 Units Subcutaneous QHS   potassium chloride   20 mEq Oral Daily   potassium chloride   20 mEq Oral Once   pregabalin   25 mg Oral Daily   traZODone   50 mg Oral QHS   Continuous Infusions:   PRN Meds:.acetaminophen , loperamide , ondansetron  **OR** ondansetron  (ZOFRAN ) IV, traMADol    I have personally reviewed following labs and imaging studies  LABORATORY DATA: CBC: Recent Labs  Lab 06/18/24 0343 06/19/24 0328 06/20/24 0243 06/21/24 0216 06/22/24  9653 06/23/24 0255 06/24/24 0335  WBC 22.8* 30.0* 23.5* 15.8* 13.0* 10.7* 9.4  NEUTROABS 20.7* 27.9* 21.5*  --   --   --   --   HGB 12.2 11.4* 10.8* 10.5* 10.2* 10.3* 9.7*  HCT 35.8* 32.5* 30.4* 29.4* 29.4* 29.9* 28.4*  MCV 79.9* 77.2* 76.6* 76.2* 76.4* 77.1* 77.2*  PLT 240 PLATELET CLUMPS NOTED ON SMEAR, COUNT APPEARS ADEQUATE 176 222 235 246 297    Basic Metabolic Panel: Recent Labs  Lab 06/19/24 0328 06/22/24 0346 06/23/24 0255 06/24/24 0335 06/24/24 0822  NA 132* 131* 133* 130* 135  K 3.7 2.9* 3.3* 2.7* 3.0*  CL 91* 91* 92* 95* 93*  CO2 21* 21* 27 18* 28  GLUCOSE 161* 201* 261* 149* 131*  BUN 80* 71* 39* 58* 30*  CREATININE 11.80* 10.00* 6.08* 8.31* 4.86*  CALCIUM  8.5* 8.4* 8.5* 8.6* 8.9  PHOS 5.2* 3.3 2.7 3.4  --     GFR: Estimated Creatinine Clearance: 13.3 mL/min (A) (by C-G formula based on SCr of 4.86 mg/dL (H)).  Liver Function Tests: Recent Labs  Lab 06/18/24 0343 06/19/24 0328 06/22/24 0346 06/23/24 0255 06/24/24 0335  AST 29  --   --   --   --   ALT <5  --   --   --   --   ALKPHOS 66  --   --   --   --   BILITOT 0.3  --   --   --   --   PROT 6.8  --   --   --   --   ALBUMIN 3.4* 3.3* 3.2* 3.1* 3.1*   No results for input(s): LIPASE, AMYLASE in the last 168 hours.  No results for  input(s): AMMONIA in the last 168 hours.  Coagulation Profile: No results for input(s): INR, PROTIME in the last 168 hours.  Cardiac Enzymes: No results for input(s): CKTOTAL, CKMB, CKMBINDEX, TROPONINI in the last 168 hours.  BNP (last 3 results) No results for input(s): PROBNP in the last 8760 hours.  Lipid Profile: No results for input(s): CHOL, HDL, LDLCALC, TRIG, CHOLHDL, LDLDIRECT in the last 72 hours.  Thyroid  Function Tests: No results for input(s): TSH, T4TOTAL, FREET4, T3FREE, THYROIDAB in the last 72 hours.  Anemia Panel: No results for input(s): VITAMINB12, FOLATE, FERRITIN, TIBC, IRON, RETICCTPCT in the last 72 hours.  Urine analysis:    Component Value Date/Time   COLORURINE YELLOW 10/12/2023 0735   APPEARANCEUR HAZY (A) 10/12/2023 0735   LABSPEC 1.013 10/12/2023 0735   PHURINE 5.0 10/12/2023 0735   GLUCOSEU 150 (A) 10/12/2023 0735   HGBUR SMALL (A) 10/12/2023 0735   BILIRUBINUR NEGATIVE 10/12/2023 0735   KETONESUR NEGATIVE 10/12/2023 0735   PROTEINUR 100 (A) 10/12/2023 0735   NITRITE NEGATIVE 10/12/2023 0735   LEUKOCYTESUR SMALL (A) 10/12/2023 0735    Sepsis Labs: Lactic Acid, Venous    Component Value Date/Time   LATICACIDVEN 0.8 06/16/2024 2006    MICROBIOLOGY: Recent Results (from the past 240 hours)  Resp panel by RT-PCR (RSV, Flu A&B, Covid) Anterior Nasal Swab     Status: None   Collection Time: 06/16/24  1:04 PM   Specimen: Anterior Nasal Swab  Result Value Ref Range Status   SARS Coronavirus 2 by RT PCR NEGATIVE NEGATIVE Final    Comment: (NOTE) SARS-CoV-2 target nucleic acids are NOT DETECTED.  The SARS-CoV-2 RNA is generally detectable in upper respiratory specimens during the acute phase of infection. The lowest concentration of SARS-CoV-2 viral copies this  assay can detect is 138 copies/mL. A negative result does not preclude SARS-Cov-2 infection and should not be used as the sole  basis for treatment or other patient management decisions. A negative result may occur with  improper specimen collection/handling, submission of specimen other than nasopharyngeal swab, presence of viral mutation(s) within the areas targeted by this assay, and inadequate number of viral copies(<138 copies/mL). A negative result must be combined with clinical observations, patient history, and epidemiological information. The expected result is Negative.  Fact Sheet for Patients:  bloggercourse.com  Fact Sheet for Healthcare Providers:  seriousbroker.it  This test is no t yet approved or cleared by the [place]  FDA and  has been authorized for detection and/or diagnosis of SARS-CoV-2 by FDA under an Emergency Use Authorization (EUA). This EUA will remain  in effect (meaning this test can be used) for the duration of the COVID-19 declaration under Section 564(b)(1) of the Act, 21 U.S.C.section 360bbb-3(b)(1), unless the authorization is terminated  or revoked sooner.       Influenza A by PCR NEGATIVE NEGATIVE Final   Influenza B by PCR NEGATIVE NEGATIVE Final    Comment: (NOTE) The Xpert Xpress SARS-CoV-2/FLU/RSV plus assay is intended as an aid in the diagnosis of influenza from Nasopharyngeal swab specimens and should not be used as a sole basis for treatment. Nasal washings and aspirates are unacceptable for Xpert Xpress SARS-CoV-2/FLU/RSV testing.  Fact Sheet for Patients: bloggercourse.com  Fact Sheet for Healthcare Providers: seriousbroker.it  This test is not yet approved or cleared by the [place]  FDA and has been authorized for detection and/or diagnosis of SARS-CoV-2 by FDA under an Emergency Use Authorization (EUA). This EUA will remain in effect (meaning this test can be used) for the duration of the COVID-19 declaration under Section 564(b)(1) of the Act,  21 U.S.C. section 360bbb-3(b)(1), unless the authorization is terminated or revoked.     Resp Syncytial Virus by PCR NEGATIVE NEGATIVE Final    Comment: (NOTE) Fact Sheet for Patients: bloggercourse.com  Fact Sheet for Healthcare Providers: seriousbroker.it  This test is not yet approved or cleared by the [place]  FDA and has been authorized for detection and/or diagnosis of SARS-CoV-2 by FDA under an Emergency Use Authorization (EUA). This EUA will remain in effect (meaning this test can be used) for the duration of the COVID-19 declaration under Section 564(b)(1) of the Act, 21 U.S.C. section 360bbb-3(b)(1), unless the authorization is terminated or revoked.  Performed at [facility], [address], [place], [place] [address]   Respiratory (~20 pathogens) panel by PCR     Status: None   Collection Time: 06/16/24  6:07 PM   Specimen: Nasopharyngeal Swab; Respiratory  Result Value Ref Range Status   Adenovirus NOT DETECTED NOT DETECTED Final   Coronavirus 229E NOT DETECTED NOT DETECTED Final    Comment: (NOTE) The Coronavirus on the Respiratory Panel, DOES NOT test for the novel  Coronavirus (2019 nCoV)    Coronavirus HKU1 NOT DETECTED NOT DETECTED Final   Coronavirus NL63 NOT DETECTED NOT DETECTED Final   Coronavirus OC43 NOT DETECTED NOT DETECTED Final   Metapneumovirus NOT DETECTED NOT DETECTED Final   Rhinovirus / Enterovirus NOT DETECTED NOT DETECTED Final   Influenza A NOT DETECTED NOT DETECTED Final   Influenza B NOT DETECTED NOT DETECTED Final   Parainfluenza Virus 1 NOT DETECTED NOT DETECTED Final   Parainfluenza Virus 2 NOT DETECTED NOT DETECTED Final   Parainfluenza Virus 3 NOT DETECTED NOT DETECTED Final  Parainfluenza Virus 4 NOT DETECTED NOT DETECTED Final   Respiratory Syncytial Virus NOT DETECTED NOT DETECTED Final   Bordetella pertussis NOT DETECTED NOT DETECTED Final    Bordetella Parapertussis NOT DETECTED NOT DETECTED Final   Chlamydophila pneumoniae NOT DETECTED NOT DETECTED Final   Mycoplasma pneumoniae NOT DETECTED NOT DETECTED Final    Comment: Performed at [facility], [address] [place], [place] [address]  Blood culture (routine x 2)     Status: None   Collection Time: 06/16/24  6:14 PM   Specimen: BLOOD  Result Value Ref Range Status   Specimen Description   Final    BLOOD RIGHT ANTECUBITAL Performed at [facility], [address] [place], [place] [address]    Special Requests   Final    Blood Culture adequate volume Performed at [facility], [address] [place], [place] [address]    Culture   Final    NO GROWTH 5 DAYS Performed at [facility], [address] [place], [place] [address]    Report Status 06/21/2024 FINAL  Final  Blood culture (routine x 2)     Status: None   Collection Time: 06/16/24  6:19 PM   Specimen: BLOOD  Result Value Ref Range Status   Specimen Description   Final    BLOOD BLOOD RIGHT ARM Performed at [facility], [address], [place], [place] [address]    Special Requests   Final    Blood Culture adequate volume Performed at [facility], [address] [place], [place] [address]    Culture   Final    NO GROWTH 5 DAYS Performed at [facility], [address], [place], [place] [address]    Report Status 06/21/2024 FINAL  Final  Gastrointestinal Panel by PCR , Stool     Status: None   Collection Time: 06/16/24  7:04 PM   Specimen: Stool  Result Value Ref Range Status   Campylobacter species NOT DETECTED NOT DETECTED Final   Plesimonas shigelloides NOT DETECTED NOT DETECTED Final   Salmonella species NOT DETECTED NOT DETECTED Final   Yersinia enterocolitica NOT DETECTED NOT DETECTED Final   Vibrio species NOT DETECTED NOT DETECTED Final   Vibrio cholerae NOT DETECTED NOT DETECTED Final    Enteroaggregative E coli (EAEC) NOT DETECTED NOT DETECTED Final   Enteropathogenic E coli (EPEC) NOT DETECTED NOT DETECTED Final   Enterotoxigenic E coli (ETEC) NOT DETECTED NOT DETECTED Final   Shiga like toxin producing E coli (STEC) NOT DETECTED NOT DETECTED Final   Shigella/Enteroinvasive E coli (EIEC) NOT DETECTED NOT DETECTED Final   Cryptosporidium NOT DETECTED NOT DETECTED Final   Cyclospora cayetanensis NOT DETECTED NOT DETECTED Final   Entamoeba histolytica NOT DETECTED NOT DETECTED Final   Giardia lamblia NOT DETECTED NOT DETECTED Final   Adenovirus F40/41 NOT DETECTED NOT DETECTED Final   Astrovirus NOT DETECTED NOT DETECTED Final   Norovirus GI/GII NOT DETECTED NOT DETECTED Final   Rotavirus A NOT DETECTED NOT DETECTED Final   Sapovirus (I, II, IV, and V) NOT DETECTED NOT DETECTED Final    Comment: Performed at [facility], [address] [place], [place] [address]  C Difficile Quick Screen w PCR reflex     Status: Abnormal   Collection Time: 06/16/24  7:04 PM   Specimen: Stool  Result Value Ref Range Status   C Diff antigen POSITIVE (A) NEGATIVE Final   C Diff toxin POSITIVE (A)  NEGATIVE Final    Comment: CRITICAL RESULT CALLED TO, READ BACK BY AND VERIFIED WITH: RN R. MONTGOMERY [id number] @2304  FH    C Diff interpretation Toxin producing C. difficile detected.  Final    Comment: Performed at [facility], [address] [place], [place] [address]    RADIOLOGY STUDIES/RESULTS: No results found.     LOS: 7 days   Donalda Applebaum, MD  [facility]    To contact the attending provider between 7A-7P or the covering provider during after hours 7P-7A, please log into the web site [email or web address] and access using universal [place] password for that web site. If you do not have the password, please call the hospital operator.  06/24/2024, 9:38 AM    "

## 2024-06-25 ENCOUNTER — Other Ambulatory Visit (HOSPITAL_COMMUNITY): Payer: Self-pay

## 2024-06-25 LAB — CBC WITH DIFFERENTIAL/PLATELET
Abs Immature Granulocytes: 0.23 K/uL — ABNORMAL HIGH (ref 0.00–0.07)
Basophils Absolute: 0 K/uL (ref 0.0–0.1)
Basophils Relative: 0 %
Eosinophils Absolute: 0.3 K/uL (ref 0.0–0.5)
Eosinophils Relative: 3 %
HCT: 31.3 % — ABNORMAL LOW (ref 36.0–46.0)
Hemoglobin: 10.6 g/dL — ABNORMAL LOW (ref 12.0–15.0)
Immature Granulocytes: 3 %
Lymphocytes Relative: 10 %
Lymphs Abs: 1 K/uL (ref 0.7–4.0)
MCH: 26.8 pg (ref 26.0–34.0)
MCHC: 33.9 g/dL (ref 30.0–36.0)
MCV: 79.2 fL — ABNORMAL LOW (ref 80.0–100.0)
Monocytes Absolute: 0.6 K/uL (ref 0.1–1.0)
Monocytes Relative: 6 %
Neutro Abs: 7 K/uL (ref 1.7–7.7)
Neutrophils Relative %: 78 %
Platelets: 352 K/uL (ref 150–400)
RBC: 3.95 MIL/uL (ref 3.87–5.11)
RDW: 17.7 % — ABNORMAL HIGH (ref 11.5–15.5)
WBC: 9.1 K/uL (ref 4.0–10.5)
nRBC: 0 % (ref 0.0–0.2)

## 2024-06-25 LAB — BASIC METABOLIC PANEL WITH GFR
Anion gap: 15 (ref 5–15)
BUN: 50 mg/dL — ABNORMAL HIGH (ref 8–23)
CO2: 25 mmol/L (ref 22–32)
Calcium: 9 mg/dL (ref 8.9–10.3)
Chloride: 94 mmol/L — ABNORMAL LOW (ref 98–111)
Creatinine, Ser: 7.49 mg/dL — ABNORMAL HIGH (ref 0.44–1.00)
GFR, Estimated: 6 mL/min — ABNORMAL LOW
Glucose, Bld: 138 mg/dL — ABNORMAL HIGH (ref 70–99)
Potassium: 3.4 mmol/L — ABNORMAL LOW (ref 3.5–5.1)
Sodium: 134 mmol/L — ABNORMAL LOW (ref 135–145)

## 2024-06-25 LAB — GLUCOSE, CAPILLARY: Glucose-Capillary: 138 mg/dL — ABNORMAL HIGH (ref 70–99)

## 2024-06-25 LAB — PHOSPHORUS: Phosphorus: 4 mg/dL (ref 2.5–4.6)

## 2024-06-25 LAB — MAGNESIUM: Magnesium: 2.2 mg/dL (ref 1.7–2.4)

## 2024-06-25 MED ORDER — LOPERAMIDE HCL 2 MG PO CAPS
2.0000 mg | ORAL_CAPSULE | Freq: Three times a day (TID) | ORAL | 0 refills | Status: DC | PRN
  Filled 2024-06-25: qty 15, 5d supply, fill #0

## 2024-06-25 NOTE — Discharge Summary (Addendum)
 "                                                                                                                                                                               Discharge summary note.  Maria Hudson FMW:[id number] DOB: [birth year] DOA: 06/16/2024  PCP: Shelda Atlas, MD  Admit date: 06/16/2024  Discharge date: 06/25/2024  Admitted From: Home   Disposition:  Home   Recommendations for Outpatient Follow-up:   Follow up with PCP in 1-2 weeks  PCP Please obtain BMP/CBC, 2 view CXR in 1week,  (see Discharge instructions)   PCP Please follow up on the following pending results:    Home Health: Outpt PT   Equipment/Devices: None  Consultations: ID  Discharge Condition: Stable    CODE STATUS: Full    Diet Recommendation: Renal-Low carbohydrate diet with 1.5 L fluid restriction per day   Chief Complaint  Patient presents with   Fever     Brief history of present illness from the day of admission and additional interim summary    64 y.o.  female with history of ESRD on HD MWF-presented with nausea, vomiting, diarrhea, subjective fever, 1 episode of syncope (3 days prior to hospitalization)-found to have C. difficile colitis.     Significant events: 1/3>> admit to [facility]   Significant studies: 1/2>> chest x-ray: Central vascular congestion. 1/2>> x-ray left shoulder: No fracture 1/2>> CT abdomen/pelvis: Mild wall thickening of transverse/descending/sigmoid colon. 1/2>> CT head: No acute intracranial abnormality.   Significant microbiology data: 1/2>> COVID/influenza/RSV PCR: Negative 1/2>> stool C. difficile antigen/toxin: Positive 1/2>> GI pathogen panel: Not detected 1/2>> respiratory virus panel: Negative 1/2>> blood culture: Negative   Procedures: None   Consults: ID                                                                 Hospital Course   C. difficile colitis Denies any prior prolonged antibiotic exposure in the past 3 months Seen by ID  placed on Dificid  finishing course soon, clinically much improved, did require low-dose Imodium  once the infection had clinically subsided, diarrhea much improved and overall now symptom-free.  Will be discharged home with outpatient PCP follow-up.   Syncope Due to orthostatic hypotension from dehydration, echo stable, stable on telemetry, worked with PT, now symptom-free will be discharged home.   ESRD on HD MTWF (home hemodialysis) Nephrology following Recently had PD catheter placed for anticipation of initiation of peritoneal dialysis.  Hyponatremia Mild-asymptomatic Likely secondary to impaired free water  excretion Should improve with HD. Follow electrolytes periodically.   Hypokalemia replaced   Chronic HFpEF Euvolemic Volume removal with HD   CAD No anginal symptoms currently Aspirin /statin/beta-blocker   HTN BP stable Coreg /Imdur   HLD Statin/Zetia    Peripheral neuropathy Continue Lyrica  and Ultram    Mood disorder Stable Continue Wellbutrin /trazodone    OSA CPAP nightly   Jehovah's Witness-refuses blood products   Class 2 Obesity Estimated body mass index is 38.04, follow-up with PCP.  DM-2 (A1c 6.3 on 1/2) Continue home regimen  Discharge diagnosis     Principal Problem:   C. difficile colitis Active Problems:   ESRD on dialysis (HCC)   T2DM (type 2 diabetes mellitus) (HCC)   Obesity, Class II, BMI 35-39.9   Chronic diastolic CHF (congestive heart failure) (HCC)   Diabetic neuropathy (HCC)   Refusal of blood product - pt is Jehovah's witness   Essential hypertension   Hypokalemia   Clostridium difficile colitis    Discharge instructions    Discharge Instructions     Ambulatory referral to Physical Therapy   Complete by: As directed    Diet renal/carb modified with fluid restriction   Complete by: As directed    Discharge instructions   Complete by: As directed    Follow with Primary MD  Shelda Atlas, MD in 1-2 weeks  Please  get a complete blood count and chemistry panel checked by your Primary MD at your next visit, and again as instructed by your Primary MD.  Get Medicines reviewed and adjusted: Please take all your medications with you for your next visit with your Primary MD  Laboratory/radiological data: Please request your Primary MD to go over all hospital tests and procedure/radiological results at the follow up, please ask your Primary MD to get all Hospital records sent to his/her office.  In some cases, they will be blood work, cultures and biopsy results pending at the time of your discharge. Please request that your primary care M.D. follows up on these results.  Also Note the following: If you experience worsening of your admission symptoms, develop shortness of breath, life threatening emergency, suicidal or homicidal thoughts you must seek medical attention immediately by calling 911 or calling your MD immediately  if symptoms less severe.  You must read complete instructions/literature along with all the possible adverse reactions/side effects for all the Medicines you take and that have been prescribed to you. Take any new Medicines after you have completely understood and accpet all the possible adverse reactions/side effects.   Do not drive when taking Pain medications or sleeping medications (Benzodaizepines)  Do not take more than prescribed Pain, Sleep and Anxiety Medications. It is not advisable to combine anxiety,sleep and pain medications without talking with your primary care practitioner  Special Instructions: If you have smoked or chewed Tobacco  in the last 2 yrs please stop smoking, stop any regular Alcohol  and or any Recreational drug use.  Wear Seat belts while driving.  Please note: You were cared for by a hospitalist during your hospital stay. Once you are discharged, your primary care physician will handle any further medical issues. Please note that NO REFILLS for any  discharge medications will be authorized once you are discharged, as it is imperative that you return to your primary care physician (or establish a relationship with a primary care physician if you do not have one) for your post hospital discharge needs so that they can reassess your need  for medications and monitor your lab values.   Discharge instructions   Complete by: As directed    Follow with Primary MD Shelda Atlas, MD in 7 days   Get CBC, CMP, Magnesium, 2 view Chest X ray -  checked next visit with your primary MD   Activity: As tolerated with Full fall precautions use walker/cane & assistance as needed  Disposition Home    Diet: Renal-low carbohydrate diet, 1.5 L fluid restriction per day.  Check CBGs q. ACHS  Special Instructions: If you have smoked or chewed Tobacco  in the last 2 yrs please stop smoking, stop any regular Alcohol  and or any Recreational drug use.  On your next visit with your primary care physician please Get Medicines reviewed and adjusted.  Please request your Prim.MD to go over all Hospital Tests and Procedure/Radiological results at the follow up, please get all Hospital records sent to your Prim MD by signing hospital release before you go home.  If you experience worsening of your admission symptoms, develop shortness of breath, life threatening emergency, suicidal or homicidal thoughts you must seek medical attention immediately by calling 911 or calling your MD immediately  if symptoms less severe.  You Must read complete instructions/literature along with all the possible adverse reactions/side effects for all the Medicines you take and that have been prescribed to you. Take any new Medicines after you have completely understood and accpet all the possible adverse reactions/side effects.   Do not drive when taking Pain medications.  Do not take more than prescribed Pain, Sleep and Anxiety Medications  Wear Seat belts while driving.   Increase  activity slowly   Complete by: As directed    Increase activity slowly   Complete by: As directed    No wound care   Complete by: As directed    No wound care   Complete by: As directed        Discharge Medications   Allergies as of 06/25/2024       Reactions   Metformin And Related Nausea Only   Other    Clonidine  patch causes skin irritation    Hydrocodone Itching   Patient able to tolerate when taken with Benadryl.   Oxycodone -acetaminophen  Itching   Patient able to tolerate when taken with Benadryl.        Medication List     STOP taking these medications    lactulose  (encephalopathy) 10 GM/15ML Soln Commonly known as: CHRONULAC    Linzess  145 MCG Caps capsule Generic drug: linaclotide    torsemide  20 MG tablet Commonly known as: DEMADEX        TAKE these medications    acetaminophen  500 MG tablet Commonly known as: TYLENOL  Take 1,000 mg by mouth every 6 (six) hours as needed for moderate pain or headache.   allopurinol  100 MG tablet Commonly known as: ZYLOPRIM  Take 100 mg by mouth in the morning.   aspirin  EC 81 MG tablet Take 81 mg by mouth in the morning. Swallow whole.   atorvastatin  80 MG tablet Commonly known as: LIPITOR Take 1 tablet (80 mg total) by mouth every evening.   buPROPion  100 MG tablet Commonly known as: WELLBUTRIN  Take 100 mg by mouth 2 (two) times daily.   calcitRIOL 0.5 MCG capsule Commonly known as: ROCALTROL Take 0.5 mcg by mouth in the morning.   carvedilol  3.125 MG tablet Commonly known as: COREG  Take 3.125 mg by mouth 2 (two) times daily.   D3 25 MCG (1000 UT) capsule Generic  drug: Cholecalciferol Take 1,000 Units by mouth daily.   diphenhydrAMINE 25 MG tablet Commonly known as: BENADRYL Take 25 mg by mouth every 6 (six) hours as needed for allergies or itching.   ezetimibe  10 MG tablet Commonly known as: ZETIA  Take 1 tablet (10 mg total) by mouth daily.   fidaxomicin  200 MG Tabs tablet Commonly known as:  DIFICID  Take 1 tablet (200 mg total) by mouth 2 (two) times daily for 7 days.   FISH OIL PO Take 2,400 mg by mouth daily with lunch.   fluticasone  50 MCG/ACT nasal spray Commonly known as: FLONASE  Place 1 spray into both nostrils daily as needed for allergies or rhinitis.   isosorbide  mononitrate 120 MG 24 hr tablet Commonly known as: IMDUR  Take 60 mg by mouth at bedtime.   loperamide  2 MG capsule Commonly known as: IMODIUM  Take 1 capsule (2 mg total) by mouth 3 (three) times daily as needed for diarrhea or loose stools.   loratadine  10 MG tablet Commonly known as: CLARITIN  Take 10 mg by mouth in the morning.   Multivitamin Adult Tabs Take 1 tablet by mouth daily with lunch.   nitroGLYCERIN  0.4 MG SL tablet Commonly known as: NITROSTAT  Place 1 tablet (0.4 mg total) under the tongue every 5 (five) minutes as needed for chest pain.   NovoLOG  FlexPen 100 UNIT/ML FlexPen Generic drug: insulin  aspart Inject 5 Units into the skin 3 (three) times daily as needed for high blood sugar.   Ozempic  (2 MG/DOSE) 8 MG/3ML Sopn Generic drug: Semaglutide  (2 MG/DOSE) Inject 2 mg into the skin every 7 (seven) days.   potassium chloride  SA 20 MEQ tablet Commonly known as: KLOR-CON  M Take 1 tablet (20 mEq total) by mouth daily.   pregabalin  75 MG capsule Commonly known as: LYRICA  Take 75 mg by mouth 2 (two) times daily.   sertraline  50 MG tablet Commonly known as: ZOLOFT  Take 50 mg by mouth in the morning.   traMADol  50 MG tablet Commonly known as: Ultram  Take 1 tablet (50 mg total) by mouth every 6 (six) hours as needed for severe pain (pain score 7-10).   traZODone  50 MG tablet Commonly known as: DESYREL  Take 50 mg by mouth at bedtime.   TURMERIC CURCUMIN PO Take 2,000 mg by mouth daily with lunch.   Velphoro  500 MG chewable tablet Generic drug: sucroferric oxyhydroxide Chew 500 mg by mouth 3 (three) times daily with meals.         Follow-up Information     Shelda Atlas, MD. Schedule an appointment as soon as possible for a visit in 1 week(s).   Specialty: Internal Medicine Contact information: [address] [place] [address] [phone]                 Major procedures and Radiology Reports - PLEASE review detailed and final reports thoroughly  -       DG CHEST PORT 1 VIEW Result Date: 06/22/2024 CLINICAL DATA:  Volume excess. EXAM: PORTABLE CHEST 1 VIEW COMPARISON:  06/16/2024 FINDINGS: Similar low volume film. Cardiopericardial silhouette is at upper limits of normal for size. There is pulmonary vascular congestion without overt pulmonary edema. Basilar interstitial opacity is slightly more conspicuous and component of dependent interstitial edema not excluded. No pleural effusion. No acute bony abnormality. IMPRESSION: 1. Low volume film with pulmonary vascular congestion. 2. Basilar interstitial opacity is slightly more conspicuous and component of dependent interstitial edema not excluded. Electronically Signed   By: Camellia Minus HERO.D.  On: 06/22/2024 08:51   ECHOCARDIOGRAM COMPLETE Result Date: 06/19/2024    ECHOCARDIOGRAM REPORT   Patient Name:   Maria Hudson Date of Exam: 06/19/2024 Medical Rec #:  [id number]        Height:       63.0 in Accession #:    [id number]       Weight:       215.0 lb Date of Birth:  02-Sep-1960        BSA:          1.994 m Patient Age:    63 years         BP:           114/73 mmHg Patient Gender: F                HR:           96 bpm. Exam Location:  Inpatient Procedure: 2D Echo, Cardiac Doppler, Color Doppler and Strain Analysis (Both            Spectral and Color Flow Doppler were utilized during procedure). Indications:    R55 Syncope  History:        Patient has prior history of Echocardiogram examinations, most                 recent 10/28/2022. CHF, CAD; Risk Factors:Hypertension,                 Dyslipidemia, Diabetes and Sleep Apnea. ESRD.  Sonographer:    Ellouise Mose RDCS Referring Phys: [id number] [facility]  [facility]  Sonographer Comments: Technically difficult study due to poor echo windows. Image acquisition challenging due to patient body habitus. IMPRESSIONS  1. Left ventricular ejection fraction, by estimation, is 60 to 65%. The left ventricle has normal function. The left ventricle has no regional wall motion abnormalities. There is mild asymmetric left ventricular hypertrophy of the basal-septal segment. Left ventricular diastolic parameters are consistent with Grade I diastolic dysfunction (impaired relaxation).  2. Right ventricular systolic function is normal. The right ventricular size is normal. Tricuspid regurgitation signal is inadequate for assessing PA pressure.  3. The mitral valve is normal in structure. No evidence of mitral valve regurgitation. No evidence of mitral stenosis.  4. The aortic valve is tricuspid. There is mild calcification of the aortic valve. Aortic valve regurgitation is not visualized. Aortic valve sclerosis is present, with no evidence of aortic valve stenosis.  5. The inferior vena cava is normal in size with greater than 50% respiratory variability, suggesting right atrial pressure of 3 mmHg. FINDINGS  Left Ventricle: Left ventricular ejection fraction, by estimation, is 60 to 65%. The left ventricle has normal function. The left ventricle has no regional wall motion abnormalities. The left ventricular internal cavity size was normal in size. There is  mild asymmetric left ventricular hypertrophy of the basal-septal segment. Left ventricular diastolic parameters are consistent with Grade I diastolic dysfunction (impaired relaxation). Right Ventricle: The right ventricular size is normal. No increase in right ventricular wall thickness. Right ventricular systolic function is normal. Tricuspid regurgitation signal is inadequate for assessing PA pressure. Left Atrium: Left atrial size was normal in size. Right Atrium: Right atrial size was normal in size. Pericardium: There is no  evidence of pericardial effusion. Mitral Valve: The mitral valve is normal in structure. No evidence of mitral valve regurgitation. No evidence of mitral valve stenosis. Tricuspid Valve: The tricuspid valve is normal in structure. Tricuspid valve regurgitation is not demonstrated. No evidence of  tricuspid stenosis. Aortic Valve: The aortic valve is tricuspid. There is mild calcification of the aortic valve. Aortic valve regurgitation is not visualized. Aortic valve sclerosis is present, with no evidence of aortic valve stenosis. Pulmonic Valve: The pulmonic valve was normal in structure. Pulmonic valve regurgitation is not visualized. No evidence of pulmonic stenosis. Aorta: The aortic root is normal in size and structure. Venous: The inferior vena cava is normal in size with greater than 50% respiratory variability, suggesting right atrial pressure of 3 mmHg. IAS/Shunts: No atrial level shunt detected by color flow Doppler.  LEFT VENTRICLE PLAX 2D LVIDd:         4.35 cm     Diastology LVIDs:         2.93 cm     LV e' medial:    6.31 cm/s LV PW:         1.60 cm     LV E/e' medial:  10.9 LV IVS:        1.30 cm     LV e' lateral:   9.90 cm/s LVOT diam:     1.90 cm     LV E/e' lateral: 6.9 LV SV:         48 LV SV Index:   24 LVOT Area:     2.84 cm LV IVRT:       109 msec  LV Volumes (MOD) LV vol d, MOD A2C: 58.3 ml LV vol d, MOD A4C: 59.2 ml LV vol s, MOD A2C: 24.7 ml LV vol s, MOD A4C: 20.4 ml LV SV MOD A2C:     33.6 ml LV SV MOD A4C:     59.2 ml LV SV MOD BP:      36.4 ml RIGHT VENTRICLE             IVC RV S prime:     12.00 cm/s  IVC diam: 1.50 cm TAPSE (M-mode): 1.4 cm                             PULMONARY VEINS                             Diastolic Velocity: 34.30 cm/s                             S/D Velocity:       1.60                             Systolic Velocity:  55.50 cm/s LEFT ATRIUM           Index        RIGHT ATRIUM          Index LA diam:      3.10 cm 1.55 cm/m   RA Area:     7.27 cm LA Vol (A2C):  20.1 ml 10.08 ml/m  RA Volume:   10.20 ml 5.12 ml/m LA Vol (A4C): 55.9 ml 28.03 ml/m  AORTIC VALVE LVOT Vmax:   108.00 cm/s LVOT Vmean:  73.900 cm/s LVOT VTI:    0.171 m  AORTA Ao Root diam: 3.00 cm Ao Asc diam:  3.60 cm MITRAL VALVE MV Area (PHT): 4.68 cm     SHUNTS MV Decel Time: 162 msec     Systemic VTI:  0.17 m  MV E velocity: 68.70 cm/s   Systemic Diam: 1.90 cm MV A velocity: 128.00 cm/s MV E/A ratio:  0.54 Oneil Parchment MD Electronically signed by Oneil Parchment MD Signature Date/Time: 06/19/2024/2:43:24 PM    Final    CT ABDOMEN PELVIS W CONTRAST Result Date: 06/16/2024 EXAM: CT ABDOMEN AND PELVIS WITH CONTRAST 06/16/2024 07:29:54 PM TECHNIQUE: CT of the abdomen and pelvis was performed with the administration of 100 mL of iohexol  (OMNIPAQUE ) 300 MG/ML solution. Multiplanar reformatted images are provided for review. Automated exposure control, iterative reconstruction, and/or weight-based adjustment of the mA/kV was utilized to reduce the radiation dose to as low as reasonably achievable. COMPARISON: 5125. CLINICAL HISTORY: Eval possible intra-abdominal infection from new peritoneal dialysis catheter. Patient with unexplained fever. FINDINGS: LOWER CHEST: Calcifications in the visualized right coronary artery. LIVER: The liver is unremarkable. GALLBLADDER AND BILE DUCTS: Small layering stones within the gallbladder. No biliary ductal dilatation. SPLEEN: No acute abnormality. PANCREAS: No acute abnormality. ADRENAL GLANDS: 1.2 cm left adrenal nodule is stable since the prior study and likely reflects adenoma. KIDNEYS, URETERS AND BLADDER: No stones in the kidneys or ureters. No hydronephrosis. No perinephric or periureteral stranding. Urinary bladder is unremarkable. GI AND BOWEL: Stomach demonstrates no acute abnormality. There appears to be mild wall thickening involving the transverse colon, descending colon and possibly the sigmoid colon suggesting colitis. Recommend clinical correlation. No bowel  obstruction. PERITONEUM AND RETROPERITONEUM: Peritoneal dialysis catheter terminates in the pelvis. Small amount of free fluid. No free air. VASCULATURE: Aorta is normal in caliber. Aortic atherosclerosis. Aortic catheter sclerosis. LYMPH NODES: No lymphadenopathy. REPRODUCTIVE ORGANS: Prior hysterectomy. No adnexal mass. BONES AND SOFT TISSUES: No acute osseous abnormality. No focal soft tissue abnormality. IMPRESSION: 1. Question mild wall thickening involving the transverse colon, descending colon, and possibly the sigmoid colon, suggestive of colitis. 2. Coronary artery disease, aortic atherosclerosis. 3. Cholelithiasis. Electronically signed by: Franky Crease MD 06/16/2024 07:36 PM EST RP Workstation: [id number]   CT Head Wo Contrast Result Date: 06/16/2024 EXAM: CT HEAD WITHOUT 06/16/2024 07:29:54 PM TECHNIQUE: CT of the head was performed without the administration of intravenous contrast. Automated exposure control, iterative reconstruction, and/or weight based adjustment of the mA/kV was utilized to reduce the radiation dose to as low as reasonably achievable. COMPARISON: 10/12/2023 CLINICAL HISTORY: Head trauma, moderate-severe; Syncopal episode and hit head. FINDINGS: BRAIN AND VENTRICLES: No acute intracranial hemorrhage. No mass effect or midline shift. No extra-axial fluid collection. No evidence of acute infarct. No hydrocephalus. ORBITS: No acute abnormality. SINUSES AND MASTOIDS: No acute abnormality. SOFT TISSUES AND SKULL: No acute skull fracture. No acute soft tissue abnormality. IMPRESSION: 1. No acute intracranial abnormality. Electronically signed by: Franky Stanford MD 06/16/2024 07:34 PM EST RP Workstation: [id number]   DG Shoulder Left Result Date: 06/16/2024 EXAM: 1 VIEW(S) XRAY OF THE LEFT SHOULDER 06/16/2024 06:57:13 PM COMPARISON: 02/03/2023 CLINICAL HISTORY: left shoulder pain after fall FINDINGS: BONES AND JOINTS: The glenohumeral joint is maintained. No fracture, subluxation, or  dislocation. Degenerative changes in the [facility] joint with joint space narrowing and spurring. Spurring at the rotator cuff insertion at the greater tuberosity. SOFT TISSUES: No abnormal calcifications. Visualized lung is unremarkable. IMPRESSION: 1. No acute fracture, subluxation, or dislocation. Electronically signed by: Franky Crease MD 06/16/2024 07:32 PM EST RP Workstation: [id number]   DG Chest 2 View Result Date: 06/16/2024 CLINICAL DATA:  Fever. EXAM: CHEST - 2 VIEW COMPARISON:  12/18/2023 FINDINGS: Improved cardiomegaly from prior exam. Stable mediastinal contours. Slight central vascular congestion. No pleural effusion or pneumothorax.  No focal airspace disease. No acute osseous findings. IMPRESSION: Improved cardiomegaly from prior exam. Slight central vascular congestion. Electronically Signed   By: Andrea Gasman M.D.   On: 06/16/2024 16:12    Micro Results   Recent Results (from the past 240 hours)  Resp panel by RT-PCR (RSV, Flu A&B, Covid) Anterior Nasal Swab     Status: None   Collection Time: 06/16/24  1:04 PM   Specimen: Anterior Nasal Swab  Result Value Ref Range Status   SARS Coronavirus 2 by RT PCR NEGATIVE NEGATIVE Final    Comment: (NOTE) SARS-CoV-2 target nucleic acids are NOT DETECTED.  The SARS-CoV-2 RNA is generally detectable in upper respiratory specimens during the acute phase of infection. The lowest concentration of SARS-CoV-2 viral copies this assay can detect is 138 copies/mL. A negative result does not preclude SARS-Cov-2 infection and should not be used as the sole basis for treatment or other patient management decisions. A negative result may occur with  improper specimen collection/handling, submission of specimen other than nasopharyngeal swab, presence of viral mutation(s) within the areas targeted by this assay, and inadequate number of viral copies(<138 copies/mL). A negative result must be combined with clinical observations, patient history, and  epidemiological information. The expected result is Negative.  Fact Sheet for Patients:  bloggercourse.com  Fact Sheet for Healthcare Providers:  seriousbroker.it  This test is no t yet approved or cleared by the [place]  FDA and  has been authorized for detection and/or diagnosis of SARS-CoV-2 by FDA under an Emergency Use Authorization (EUA). This EUA will remain  in effect (meaning this test can be used) for the duration of the COVID-19 declaration under Section 564(b)(1) of the Act, 21 U.S.C.section 360bbb-3(b)(1), unless the authorization is terminated  or revoked sooner.       Influenza A by PCR NEGATIVE NEGATIVE Final   Influenza B by PCR NEGATIVE NEGATIVE Final    Comment: (NOTE) The Xpert Xpress SARS-CoV-2/FLU/RSV plus assay is intended as an aid in the diagnosis of influenza from Nasopharyngeal swab specimens and should not be used as a sole basis for treatment. Nasal washings and aspirates are unacceptable for Xpert Xpress SARS-CoV-2/FLU/RSV testing.  Fact Sheet for Patients: bloggercourse.com  Fact Sheet for Healthcare Providers: seriousbroker.it  This test is not yet approved or cleared by the [place]  FDA and has been authorized for detection and/or diagnosis of SARS-CoV-2 by FDA under an Emergency Use Authorization (EUA). This EUA will remain in effect (meaning this test can be used) for the duration of the COVID-19 declaration under Section 564(b)(1) of the Act, 21 U.S.C. section 360bbb-3(b)(1), unless the authorization is terminated or revoked.     Resp Syncytial Virus by PCR NEGATIVE NEGATIVE Final    Comment: (NOTE) Fact Sheet for Patients: bloggercourse.com  Fact Sheet for Healthcare Providers: seriousbroker.it  This test is not yet approved or cleared by the [place]  FDA and has been  authorized for detection and/or diagnosis of SARS-CoV-2 by FDA under an Emergency Use Authorization (EUA). This EUA will remain in effect (meaning this test can be used) for the duration of the COVID-19 declaration under Section 564(b)(1) of the Act, 21 U.S.C. section 360bbb-3(b)(1), unless the authorization is terminated or revoked.  Performed at [facility], [address], [place], [place] [address]   Respiratory (~20 pathogens) panel by PCR     Status: None   Collection Time: 06/16/24  6:07 PM   Specimen: Nasopharyngeal Swab; Respiratory  Result Value Ref Range Status  Adenovirus NOT DETECTED NOT DETECTED Final   Coronavirus 229E NOT DETECTED NOT DETECTED Final    Comment: (NOTE) The Coronavirus on the Respiratory Panel, DOES NOT test for the novel  Coronavirus (2019 nCoV)    Coronavirus HKU1 NOT DETECTED NOT DETECTED Final   Coronavirus NL63 NOT DETECTED NOT DETECTED Final   Coronavirus OC43 NOT DETECTED NOT DETECTED Final   Metapneumovirus NOT DETECTED NOT DETECTED Final   Rhinovirus / Enterovirus NOT DETECTED NOT DETECTED Final   Influenza A NOT DETECTED NOT DETECTED Final   Influenza B NOT DETECTED NOT DETECTED Final   Parainfluenza Virus 1 NOT DETECTED NOT DETECTED Final   Parainfluenza Virus 2 NOT DETECTED NOT DETECTED Final   Parainfluenza Virus 3 NOT DETECTED NOT DETECTED Final   Parainfluenza Virus 4 NOT DETECTED NOT DETECTED Final   Respiratory Syncytial Virus NOT DETECTED NOT DETECTED Final   Bordetella pertussis NOT DETECTED NOT DETECTED Final   Bordetella Parapertussis NOT DETECTED NOT DETECTED Final   Chlamydophila pneumoniae NOT DETECTED NOT DETECTED Final   Mycoplasma pneumoniae NOT DETECTED NOT DETECTED Final    Comment: Performed at [facility], [address] [place], [place] [address]  Blood culture (routine x 2)     Status: None   Collection Time: 06/16/24  6:14 PM   Specimen: BLOOD  Result Value Ref Range Status    Specimen Description   Final    BLOOD RIGHT ANTECUBITAL Performed at [facility], [address], [place], [place] [address]    Special Requests   Final    Blood Culture adequate volume Performed at [facility], [address] [place], [place] [address]    Culture   Final    NO GROWTH 5 DAYS Performed at [facility], [address] [place], [place] [address]    Report Status 06/21/2024 FINAL  Final  Blood culture (routine x 2)     Status: None   Collection Time: 06/16/24  6:19 PM   Specimen: BLOOD  Result Value Ref Range Status   Specimen Description   Final    BLOOD BLOOD RIGHT ARM Performed at [facility], [address], [place], [place] [address]    Special Requests   Final    Blood Culture adequate volume Performed at [facility], [address] [place], [place] [address]    Culture   Final    NO GROWTH 5 DAYS Performed at [facility], [address] [place], [place] [address]    Report Status 06/21/2024 FINAL  Final  Gastrointestinal Panel by PCR , Stool     Status: None   Collection Time: 06/16/24  7:04 PM   Specimen: Stool  Result Value Ref Range Status   Campylobacter species NOT DETECTED NOT DETECTED Final   Plesimonas shigelloides NOT DETECTED NOT DETECTED Final   Salmonella species NOT DETECTED NOT DETECTED Final   Yersinia enterocolitica NOT DETECTED NOT DETECTED Final   Vibrio species NOT DETECTED NOT DETECTED Final   Vibrio cholerae NOT DETECTED NOT DETECTED Final   Enteroaggregative E coli (EAEC) NOT DETECTED NOT DETECTED Final   Enteropathogenic E coli (EPEC) NOT DETECTED NOT DETECTED Final   Enterotoxigenic E coli (ETEC) NOT DETECTED NOT DETECTED Final   Shiga like toxin producing E coli (STEC) NOT DETECTED NOT DETECTED Final   Shigella/Enteroinvasive E coli (EIEC) NOT DETECTED NOT DETECTED Final   Cryptosporidium NOT DETECTED NOT DETECTED Final    Cyclospora cayetanensis NOT DETECTED NOT  DETECTED Final   Entamoeba histolytica NOT DETECTED NOT DETECTED Final   Giardia lamblia NOT DETECTED NOT DETECTED Final   Adenovirus F40/41 NOT DETECTED NOT DETECTED Final   Astrovirus NOT DETECTED NOT DETECTED Final   Norovirus GI/GII NOT DETECTED NOT DETECTED Final   Rotavirus A NOT DETECTED NOT DETECTED Final   Sapovirus (I, II, IV, and V) NOT DETECTED NOT DETECTED Final    Comment: Performed at [facility], [address] [place], [place] [address]  C Difficile Quick Screen w PCR reflex     Status: Abnormal   Collection Time: 06/16/24  7:04 PM   Specimen: Stool  Result Value Ref Range Status   C Diff antigen POSITIVE (A) NEGATIVE Final   C Diff toxin POSITIVE (A) NEGATIVE Final    Comment: CRITICAL RESULT CALLED TO, READ BACK BY AND VERIFIED WITH: RN R. MONTGOMERY [id number] @2304  FH    C Diff interpretation Toxin producing C. difficile detected.  Final    Comment: Performed at [facility], [address], [place], [place] [address]    Today   Subjective    Maria Hudson today has no headache,no chest abdominal pain,no new weakness tingling or numbness, feels much better wants to go home today.    Objective   Blood pressure (!) 130/58, pulse 65, temperature 98 F (36.7 C), temperature source Oral, resp. rate 16, height 5' 3 (1.6 m), weight 97.4 kg, SpO2 98%.  No intake or output data in the 24 hours ending 06/25/24 0928  Exam  Awake Alert, No new F.N deficits,    [place].AT,PERRAL Supple Neck,   Symmetrical Chest wall movement, Good air movement bilaterally, CTAB RRR,No Gallops,   +ve B.Sounds, Abd Soft, Non tender,  No Cyanosis, Clubbing or edema    Data Review   Recent Labs  Lab 06/19/24 0328 06/20/24 0243 06/21/24 0216 06/22/24 0346 06/23/24 0255 06/24/24 0335 06/25/24 0843  WBC 30.0* 23.5* 15.8* 13.0* 10.7* 9.4 9.1  HGB 11.4* 10.8* 10.5* 10.2* 10.3* 9.7* 10.6*  HCT 32.5* 30.4* 29.4* 29.4* 29.9*  28.4* 31.3*  PLT PLATELET CLUMPS NOTED ON SMEAR, COUNT APPEARS ADEQUATE 176 222 235 246 297 352  MCV 77.2* 76.6* 76.2* 76.4* 77.1* 77.2* 79.2*  MCH 27.1 27.2 27.2 26.5 26.5 26.4 26.8  MCHC 35.1 35.5 35.7 34.7 34.4 34.2 33.9  RDW 17.0* 16.9* 16.6* 16.8* 16.8* 17.1* 17.7*  LYMPHSABS 0.5* 0.5*  --   --   --   --  1.0  MONOABS 1.0 0.9  --   --   --   --  0.6  EOSABS 0.0 0.2  --   --   --   --  0.3  BASOSABS 0.1 0.1  --   --   --   --  0.0    Recent Labs  Lab 06/19/24 0328 06/22/24 0346 06/23/24 0255 06/24/24 0335 06/24/24 0822 06/24/24 1142  NA 132* 131* 133* 130* 135 133*  K 3.7 2.9* 3.3* 2.7* 3.0* 3.0*  CL 91* 91* 92* 95* 93* 93*  CO2 21* 21* 27 18* 28 28  ANIONGAP 20* 18* 14 18* 14 13  GLUCOSE 161* 201* 261* 149* 131* 172*  BUN 80* 71* 39* 58* 30* 33*  CREATININE 11.80* 10.00* 6.08* 8.31* 4.86* 5.30*  ALBUMIN 3.3* 3.2* 3.1* 3.1*  --   --   PHOS 5.2* 3.3 2.7 3.4  --   --   CALCIUM  8.5* 8.4* 8.5* 8.6* 8.9 8.8*    Total Time in preparing paper  work, patent examiner and todays exam - 35 minutes  Signature  -    Lavada Stank M.D on 06/25/2024 at 9:28 AM   -  To page go to [email or web address]      "

## 2024-06-25 NOTE — Plan of Care (Signed)

## 2024-06-25 NOTE — Plan of Care (Signed)
" °  Problem: Health Behavior/Discharge Planning: Goal: Ability to manage health-related needs will improve Outcome: Progressing   Problem: Metabolic: Goal: Ability to maintain appropriate glucose levels will improve Outcome: Progressing   Problem: Clinical Measurements: Goal: Will remain free from infection Outcome: Progressing   Problem: Elimination: Goal: Will not experience complications related to bowel motility Outcome: Progressing   "

## 2024-06-25 NOTE — Progress Notes (Signed)
 " [facility] Progress Note   Subjective:   Seen and examined at bedside.  Sitting in bedside chair.  Feeling a little better this AM.  Reports diarrhea improved, only 1 BM overnight and 1 BM this AM.  Denies chest pain, palpitations, weakness, dizziness and fatigue. Potassium improved to 3.4 today.  Objective Vitals:   06/24/24 1633 06/24/24 1942 06/25/24 0026 06/25/24 0746  BP: (!) 105/57 (!) 99/55 (!) 130/58   Pulse: 70 70 60 65  Resp: 18   16  Temp: 98.2 F (36.8 C) 97.9 F (36.6 C) 98.2 F (36.8 C) 98 F (36.7 C)  TempSrc: Oral Oral Oral Oral  SpO2: 98%     Weight:      Height:       Physical Exam General:well appearing female in NAD Heart:RRR Lungs:CTAB Abdomen:soft, PD catheter in place Extremities:no LE edema Dialysis Access: LU AVF, PD catheter in LUQ   Filed Weights   06/20/24 1437 06/20/24 1830 06/22/24 2013  Weight: 100.6 kg 100.6 kg 97.4 kg   No intake or output data in the 24 hours ending 06/25/24 0938  Additional Objective Labs: Basic Metabolic Panel: Recent Labs  Lab 06/23/24 0255 06/24/24 0335 06/24/24 0822 06/24/24 1142 06/25/24 0843  NA 133* 130* 135 133* 134*  K 3.3* 2.7* 3.0* 3.0* 3.4*  CL 92* 95* 93* 93* 94*  CO2 27 18* 28 28 25   GLUCOSE 261* 149* 131* 172* 138*  BUN 39* 58* 30* 33* 50*  CREATININE 6.08* 8.31* 4.86* 5.30* 7.49*  CALCIUM  8.5* 8.6* 8.9 8.8* 9.0  PHOS 2.7 3.4  --   --  4.0   Liver Function Tests: Recent Labs  Lab 06/22/24 0346 06/23/24 0255 06/24/24 0335  ALBUMIN 3.2* 3.1* 3.1*   CBC: Recent Labs  Lab 06/19/24 0328 06/20/24 0243 06/21/24 0216 06/22/24 0346 06/23/24 0255 06/24/24 0335 06/25/24 0843  WBC 30.0* 23.5* 15.8* 13.0* 10.7* 9.4 9.1  NEUTROABS 27.9* 21.5*  --   --   --   --  7.0  HGB 11.4* 10.8* 10.5* 10.2* 10.3* 9.7* 10.6*  HCT 32.5* 30.4* 29.4* 29.4* 29.9* 28.4* 31.3*  MCV 77.2* 76.6* 76.2* 76.4* 77.1* 77.2* 79.2*  PLT PLATELET CLUMPS NOTED ON SMEAR, COUNT APPEARS ADEQUATE 176 222  235 246 297 352   Blood Culture    Component Value Date/Time   SDES  06/16/2024 1819    BLOOD BLOOD RIGHT ARM Performed at [facility], [address], [place], [place] [address]    [facility]  06/16/2024 1819    Blood Culture adequate volume Performed at [facility], [address], [place], [place] [address]    CULT  06/16/2024 1819    NO GROWTH 5 DAYS Performed at [facility], [address], [place], [place] [address]    REPTSTATUS 06/21/2024 FINAL 06/16/2024 1819    CBG: Recent Labs  Lab 06/24/24 0833 06/24/24 1220 06/24/24 1635 06/24/24 1946 06/25/24 0747  GLUCAP 136* 177* 143* 159* 138*    Medications:   aspirin  EC  81 mg Oral Daily   atorvastatin   80 mg Oral QPM   buPROPion   100 mg Oral BID   Chlorhexidine  Gluconate Cloth  6 each Topical Q0600   ezetimibe   10 mg Oral Daily   fidaxomicin   200 mg Oral BID   heparin   5,000 Units Subcutaneous Q8H   insulin  aspart  0-15 Units Subcutaneous TID WC   insulin  aspart  0-5 Units Subcutaneous QHS   potassium chloride   20  mEq Oral Daily   pregabalin   25 mg Oral Daily   traZODone   50 mg Oral QHS    Dialysis Orders: HHD Nx Stage - MTTF. EDW 102.5kg, 2K bath PD cath placed 06/02/24 - flushed 12/22, flushed 12/29   Assessment/Plan: C. Diff Colitis: + toxin and colitis on imaging. On Dificid . Ongoing diarrhea - finally improving, WBC back to normal. ESRD: On home HD, unable to dialyze for few days prior to admit. S/p short HD 1/4 (all she could tolerate), HD completed yesterday. To resume regular HHD tomorrow.  Hypokalemia: 2/2 to diarrhea.  K better today at 3.4.  Plan for 20 mEq KCl x 1 week with close f/u with home therapies.   New PD cath: Doubt peritonitis in light of clear cause of symptoms as above. Flushed PD catheter 1/6. Next flush is due on 06/28/24. Hypertension/volume: BP ok, no edema on exam. Recent CXR showed vascular congestion, 1L UF with HD on 1/9 but  dizzy after. No UF yesterday. Hasn't been given isosorbide  or Coreg  this admit (due to low BP) - will d/c for now. Only restart after d/c if needed for BP. Anemia: Hgb 10.6. no ESA for now. Continue to monitor. Metabolic bone disease: Ca/Phos ok. Home off on binders until eating better.  Manuelita Labella, PA-C [facility] 06/25/2024,9:38 AM  LOS: 8 days    "

## 2024-06-25 NOTE — Discharge Instructions (Signed)
 Follow with Primary MD Shelda Atlas, MD in 7 days   Get CBC, CMP, Magnesium, 2 view Chest X ray -  checked next visit with your primary MD   Activity: As tolerated with Full fall precautions use walker/cane & assistance as needed  Disposition Home    Diet: Renal-low carbohydrate diet, 1.5 L fluid restriction per day.  Check CBGs q. ACHS  Special Instructions: If you have smoked or chewed Tobacco  in the last 2 yrs please stop smoking, stop any regular Alcohol  and or any Recreational drug use.  On your next visit with your primary care physician please Get Medicines reviewed and adjusted.  Please request your Prim.MD to go over all Hospital Tests and Procedure/Radiological results at the follow up, please get all Hospital records sent to your Prim MD by signing hospital release before you go home.  If you experience worsening of your admission symptoms, develop shortness of breath, life threatening emergency, suicidal or homicidal thoughts you must seek medical attention immediately by calling 911 or calling your MD immediately  if symptoms less severe.  You Must read complete instructions/literature along with all the possible adverse reactions/side effects for all the Medicines you take and that have been prescribed to you. Take any new Medicines after you have completely understood and accpet all the possible adverse reactions/side effects.   Do not drive when taking Pain medications.  Do not take more than prescribed Pain, Sleep and Anxiety Medications  Wear Seat belts while driving.

## 2024-06-26 NOTE — Progress Notes (Signed)
 Late note entry 1/12 0908am   D/c orders noted. Contacted out-pt HD clinic [facility] and informed of pt d/c. No further support is needed.     Lavanda Sui Kasparek Dialysis Navigator [phone]

## 2024-07-04 ENCOUNTER — Telehealth (HOSPITAL_COMMUNITY): Payer: Self-pay

## 2024-07-04 NOTE — Telephone Encounter (Signed)
 Called to confirm/remind patient of their appointment [facility] on 07/05/[facility].   Appointment:   [x] Confirmed  [] Left mess   [] No answer/No voice mail  [] VM Full/unable to leave message  [] Phone not in service  Patient reminded to bring all medications and/or complete list.  Confirmed patient has transportation. Gave directions, instructed to utilize valet parking.

## 2024-07-04 NOTE — Progress Notes (Signed)
 PCP: Shelda Atlas, MD Nephrology: Dr. Jerrye HF Cardiology: Dr. Rolan  Chief complaint: Chest pain  64 y.o. with history of supine hypertension with orthostatic hypotension, CKD stage 4, CAD, and chronic diastolic CHF was referred from [facility] clinic for evaluation of CHF.    Patient had MI in 05/2013. LHC showed 20% LAD, 50 to 60% left circumflex, and 100% RPDA stenosis with left-to-right collaterals. CAD managed medically.    She reports a long h/o diastolic heart failure and frequent hospitalizations. She moved to [place] from [facility], had been followed by [facility]. Has struggled w/ volume control and readjustment of diuretics over the years. She reports previously being prescribed Farxiga  but had to stop b/c her insurance would not cover it. Her last echo at [facility] was 7/21. LVEF was 55%, RV normal. RVSP elevated at 60 mmHg. PYP at [facility] was negative for amyloid (grade 1, H/CL 1.21). Serum IFE not suggestive of monoclonal gammopathy. Urine IFE and immunoglobin free light chains were elevated (lg FLCK 9.24, Ig FLCL 3.75, K/L ratio 2.46. Repeat testing recommended 6 months later, but do not see that this was completed).    Referred to HTN clinic 9/22 for uncontrolled HTN. Thyroid  function, renin, and aldosterone were normal, as were catecholamines and metanephrines. Her PTH was elevated at 134 but calcium  was normal. Renal artery doppler study was limited. There was no evidence of right RAS however the left renal artery was not well visualized due to the presence of bowel gas.  Echo was repeated 9/22 showing normal LVEF 55-60%, G1DD and normal RV.    She was admitted again in 11/22 with chest pain, AKI, and orthostatic hypotension.  Torsemide  was stopped.  HS-TnI was negative, suspect musculoskeletal chest pain.    PYP study repeated in 12/22 was not suggestive of cardiac amyloidosis.   She had an AV fistula placed with steadily worsening renal failure.  She is now doing home HD.   Echo 5/24  showed EF 60-65%, moderate LVH, G1DD, normal RV, RVSP 35 mmHg.   PYP scan in 9/24 was grade I, not suggestive of ATTR cardiac amyloidosis.   Today she returns for HF follow up. She was seen in the ER in 7/25 with left arm pain/dyspnea.  She has been having episodes of left arm pain x 2 months.  Not exertional.  She describes the discomfort as a weird feeling.  Sometimes this is associated with palpitations, slow heart beats.  No lightheadedness or syncope with these events.  Workup in the ER was unremarkable.  She was sent home with recommendation to get a stress test.  This has not been scheduled. She says that she is always short of breath, but she generally does ok with ADLs.   Patient wants to start peritoneal dialysis.  She needs abdominal surgery for catheter placement. This will be challenging given prior abdominal surgeries.   ECG (personally reviewed): NSR, poor RWP  Labs (11/22): K 3.8, creatinine 3.6 => 2.62 Labs (2/23): K 3.7, creatinine 3.31  Labs (4/23): K 4.5, creatinine 2.69, LDL 78, HDL 58 Labs (6/23): K 4.2, creatinine 2.65 Labs (9/23): K 3.6, creatinine 2.25 Labs (11/23): K 4.1, creatinine 2.52, BNP 81 Labs (4/24): K 4.1, creatinine 2.92, LDL 54  PMH: 1. HTN: Urine catecholamines and metanephrines normal level, aldosterone normal level.  Complicated by orthostatic hypotension.   - Renal artery dopplers (11/22): No right renal artery stenosis, unable to visualize left renal artery due to overlying bowel gas.  2. Orthostatic hypotension 3. ESRD: Home  HD.  4. Type 2 diabetes 5. OSA: Uses CPAP 6. CAD: LHC in 2014 with totally occluded RCA with collaterals, medical management.  7. Chronic diastolic CHF: Echo (7/21) with EF 55%, normal RV, PASP 60 mmHg.  - PYP scan 11/20 at [facility] was negative with grade 1, H/CL 1.21.  - Echo (9/22): EF 55-60%, mild LVH, normal RV, IVC normal.  - RHC (11/22): mean RA 1, PA 41/10 mean 24, mean PCWP 12, CI 3.  - PYP scan (12/22): grade 1,  H/CL 1.  - Echo (5/24) EF 60-65%,  moderate LVH, G1DD, normal RV, RVSP 35 - PYP scan in 9/24 was grade I, not suggestive of ATTR cardiac amyloidosis.  8. Gout 9. COVID-19 2/23  Social History   Socioeconomic History   Marital status: Divorced    Spouse name: Not on file   Number of children: 2   Years of education: Not on file   Highest education level: Some college, no degree  Occupational History   Occupation: retired   Occupation: disabled  Tobacco Use   Smoking status: Never    Passive exposure: Never   Smokeless tobacco: Never  Vaping Use   Vaping status: Never Used  Substance and Sexual Activity   Alcohol use: Not Currently   Drug use: Never   Sexual activity: Not Currently    Birth control/protection: Post-menopausal, Surgical  Other Topics Concern   Not on file  Social History Narrative   Not on file   Social Drivers of Health   Tobacco Use: Low Risk (06/17/2024)   Patient History    Smoking Tobacco Use: Never    Smokeless Tobacco Use: Never    Passive Exposure: Never  Financial Resource Strain: Low Risk (05/31/2023)   Received from [facility]   Overall Financial Resource Strain (CARDIA)    Difficulty of Paying Living Expenses: Not very hard  Food Insecurity: No Food Insecurity (06/17/2024)   Epic    Worried About Radiation Protection Practitioner of Food in the Last Year: Never true    Ran Out of Food in the Last Year: Never true  Transportation Needs: No Transportation Needs (06/17/2024)   Epic    Lack of Transportation (Medical): No    Lack of Transportation (Non-Medical): No  Physical Activity: Inactive (08/23/2023)   Exercise Vital Sign    Days of Exercise per Week: 0 days    Minutes of Exercise per Session: 0 min  Stress: No Stress Concern Present (08/23/2023)   [employer] of Occupational Health - Occupational Stress Questionnaire    Feeling of Stress : Only a little  Social Connections: Moderately Isolated (08/17/2023)   Social Connection and Isolation Panel     Frequency of Communication with Friends and Family: More than three times a week    Frequency of Social Gatherings with Friends and Family: More than three times a week    Attends Religious Services: More than 4 times per year    Active Member of [employer] or Organizations: No    Attends Banker Meetings: Never    Marital Status: Divorced  Catering Manager Violence: Not At Risk (06/17/2024)   Epic    Fear of Current or Ex-Partner: No    Emotionally Abused: No    Physically Abused: No    Sexually Abused: No  Depression (PHQ2-9): Low Risk (05/12/2023)   Depression (PHQ2-9)    PHQ-2 Score: 0  Alcohol Screen: Low Risk (06/17/2023)   Alcohol Screen    Last Alcohol Screening Score (AUDIT):  0  Housing: Low Risk (06/17/2024)   Epic    Unable to Pay for Housing in the Last Year: No    Number of Times Moved in the Last Year: 0    Homeless in the Last Year: No  Utilities: Not At Risk (06/17/2024)   Epic    Threatened with loss of utilities: No  Health Literacy: Adequate Health Literacy (07/19/2023)   B1300 Health Literacy    Frequency of need for help with medical instructions: Never   Family History  Problem Relation Age of Onset   Hypertension Mother    Stroke Mother    Diabetes Mother    Other Mother        enlarged heart   Hypertension Father    Hypertension Sister    Diabetes Sister    CAD Sister        enlarged heart   Kidney disease Sister    Diabetes Paternal Grandmother    Hypertension Daughter    Diabetes Daughter    Hypertension Son    Diabetes Son    Breast cancer Paternal Aunt    ROS: All systems reviewed and negative except as per HPI.   Current Outpatient Medications  Medication Sig Dispense Refill   acetaminophen  (TYLENOL ) 500 MG tablet Take 1,000 mg by mouth every 6 (six) hours as needed for moderate pain or headache.     allopurinol  (ZYLOPRIM ) 100 MG tablet Take 100 mg by mouth in the morning.     aspirin  EC 81 MG tablet Take 81 mg by mouth in the  morning. Swallow whole.     atorvastatin  (LIPITOR) 80 MG tablet Take 1 tablet (80 mg total) by mouth every evening. 90 tablet 1   buPROPion  (WELLBUTRIN ) 100 MG tablet Take 100 mg by mouth 2 (two) times daily.     calcitRIOL (ROCALTROL) 0.5 MCG capsule Take 0.5 mcg by mouth in the morning. (Patient not taking: Reported on 05/30/2024)     carvedilol  (COREG ) 3.125 MG tablet Take 3.125 mg by mouth 2 (two) times daily.     Cholecalciferol (D3) 25 MCG (1000 UT) capsule Take 1,000 Units by mouth daily.     diphenhydrAMINE (BENADRYL) 25 MG tablet Take 25 mg by mouth every 6 (six) hours as needed for allergies or itching.     ezetimibe  (ZETIA ) 10 MG tablet Take 1 tablet (10 mg total) by mouth daily. 90 tablet 3   fluticasone  (FLONASE ) 50 MCG/ACT nasal spray Place 1 spray into both nostrils daily as needed for allergies or rhinitis. (Patient not taking: Reported on 06/19/2024)     isosorbide  mononitrate (IMDUR ) 120 MG 24 hr tablet Take 60 mg by mouth at bedtime.     loperamide  (IMODIUM ) 2 MG capsule Take 1 capsule (2 mg total) by mouth 3 (three) times daily as needed for diarrhea or loose stools. 15 capsule 0   loratadine  (CLARITIN ) 10 MG tablet Take 10 mg by mouth in the morning.     Multiple Vitamin (MULTIVITAMIN ADULT) TABS Take 1 tablet by mouth daily with lunch.     nitroGLYCERIN  (NITROSTAT ) 0.4 MG SL tablet Place 1 tablet (0.4 mg total) under the tongue every 5 (five) minutes as needed for chest pain. 20 tablet 0   NOVOLOG  FLEXPEN 100 UNIT/ML FlexPen Inject 5 Units into the skin 3 (three) times daily as needed for high blood sugar.     Omega-3 Fatty Acids (FISH OIL PO) Take 2,400 mg by mouth daily with lunch.     OZEMPIC , 2  MG/DOSE, 8 MG/3ML SOPN Inject 2 mg into the skin every 7 (seven) days.     potassium chloride  SA (KLOR-CON  M) 20 MEQ tablet Take 1 tablet (20 mEq total) by mouth daily. 7 tablet 0   pregabalin  (LYRICA ) 75 MG capsule Take 75 mg by mouth 2 (two) times daily.     sertraline  (ZOLOFT ) 50  MG tablet Take 50 mg by mouth in the morning.     sucroferric oxyhydroxide (VELPHORO ) 500 MG chewable tablet Chew 500 mg by mouth 3 (three) times daily with meals.     traMADol  (ULTRAM ) 50 MG tablet Take 1 tablet (50 mg total) by mouth every 6 (six) hours as needed for severe pain (pain score 7-10). 20 tablet 0   traZODone  (DESYREL ) 50 MG tablet Take 50 mg by mouth at bedtime.     TURMERIC CURCUMIN PO Take 2,000 mg by mouth daily with lunch.     No current facility-administered medications for this visit.   Facility-Administered Medications Ordered in Other Visits  Medication Dose Route Frequency Provider Last Rate Last Admin   technetium pyrophosphate Tc 34m injection 21.2 millicurie  21.2 millicurie Intravenous Once Hilty, Vinie BROCKS, MD       Wt Readings from Last 3 Encounters:  06/22/24 97.4 kg (214 lb 11.7 oz)  06/02/24 98.9 kg (218 lb)  01/05/24 105.1 kg (231 lb 12.8 oz)   There were no vitals taken for this visit. General: NAD Neck: No JVD, no thyromegaly or thyroid  nodule.  Lungs: Clear to auscultation bilaterally with normal respiratory effort. CV: Nondisplaced PMI.  Heart regular S1/S2, no S3/S4, 3/6 murmur across precordium that seems to radiate from her left arm fistula.  No peripheral edema.  No carotid bruit.  Normal pedal pulses.  Abdomen: Soft, nontender, no hepatosplenomegaly, no distention.  Skin: Intact without lesions or rashes.  Neurologic: Alert and oriented x 3.  Psych: Normal affect. Extremities: No clubbing or cyanosis.  HEENT: Normal.   Assessment/Plan: 1. HTN: BP now controlled on HD. BP meds have been cut back.  - Continue amlodipine  10 mg daily.  - Continue Coreg  6.25 mg bid.  2. Chronic diastolic CHF: Echo in 9/22 with EF 55-60%, normal RV. RHC in 11/22 with optimized filling pressures.  PYP scans (12/22, 9/24) and myeloma studies were not suggestive of cardiac amyloidosis. Echo 5/24 showed EF 60-65%, moderate LVH. Volume controlled now by HD. She gets  torsemide  on non-HD days.  3. ESRD: She is getting home HD.  Plan for PD catheter placement, will require abdominal surgery.  - She is going to get a stress test (see below).  If this is low risk, I think she can go for PD catheter placement with reasonable risk.   4. CAD: H/o occluded PDA with collaterals.  She has had left arm pain and a weird feeling in her chest that could possibly be due to palpitations/PVCs. Workup in ER earlier this month was unremarkable. It is possible that the left arm pain is related to her left arm fistula.  - Continue ASA 81 daily.  - Continue atorvastatin , check lipids today.  - I will arrange for cardiac PET to assess for ischemia. - I am also going to arrange for Zio monitor x 2 wks to assess for arrhythmias.  5. Obesity: There is no height or weight on file to calculate BMI. - She is on Ozempic . 6. OSA: Continue CPAP.  Follow up in 6 months with APP.   I spent 32 minutes reviewing records, interviewing/examining  patient, and managing orders.    Harlene HERO [facility]  07/04/2024

## 2024-07-05 ENCOUNTER — Ambulatory Visit (HOSPITAL_COMMUNITY)
Admission: RE | Admit: 2024-07-05 | Discharge: 2024-07-05 | Disposition: A | Discharge status: Home / Self Care | Source: Ambulatory Visit | Attending: Family Medicine | Admitting: Family Medicine

## 2024-07-05 ENCOUNTER — Encounter (HOSPITAL_COMMUNITY): Payer: Self-pay

## 2024-07-05 ENCOUNTER — Ambulatory Visit (HOSPITAL_COMMUNITY)
Admission: RE | Admit: 2024-07-05 | Discharge: 2024-07-05 | Disposition: A | Source: Ambulatory Visit | Attending: Family Medicine | Admitting: Family Medicine

## 2024-07-05 VITALS — BP 148/60 | HR 77 | Wt 220.0 lb

## 2024-07-05 DIAGNOSIS — Z992 Dependence on renal dialysis: Secondary | ICD-10-CM | POA: Diagnosis not present

## 2024-07-05 DIAGNOSIS — Z7985 Long-term (current) use of injectable non-insulin antidiabetic drugs: Secondary | ICD-10-CM | POA: Insufficient documentation

## 2024-07-05 DIAGNOSIS — G4733 Obstructive sleep apnea (adult) (pediatric): Secondary | ICD-10-CM | POA: Insufficient documentation

## 2024-07-05 DIAGNOSIS — I951 Orthostatic hypotension: Secondary | ICD-10-CM | POA: Insufficient documentation

## 2024-07-05 DIAGNOSIS — I5032 Chronic diastolic (congestive) heart failure: Secondary | ICD-10-CM | POA: Diagnosis not present

## 2024-07-05 DIAGNOSIS — N186 End stage renal disease: Secondary | ICD-10-CM | POA: Diagnosis not present

## 2024-07-05 DIAGNOSIS — Z79899 Other long term (current) drug therapy: Secondary | ICD-10-CM | POA: Insufficient documentation

## 2024-07-05 DIAGNOSIS — E1122 Type 2 diabetes mellitus with diabetic chronic kidney disease: Secondary | ICD-10-CM | POA: Insufficient documentation

## 2024-07-05 DIAGNOSIS — R0989 Other specified symptoms and signs involving the circulatory and respiratory systems: Secondary | ICD-10-CM | POA: Insufficient documentation

## 2024-07-05 DIAGNOSIS — E669 Obesity, unspecified: Secondary | ICD-10-CM | POA: Insufficient documentation

## 2024-07-05 DIAGNOSIS — Z6838 Body mass index (BMI) 38.0-38.9, adult: Secondary | ICD-10-CM | POA: Insufficient documentation

## 2024-07-05 DIAGNOSIS — Z794 Long term (current) use of insulin: Secondary | ICD-10-CM | POA: Insufficient documentation

## 2024-07-05 DIAGNOSIS — I132 Hypertensive heart and chronic kidney disease with heart failure and with stage 5 chronic kidney disease, or end stage renal disease: Secondary | ICD-10-CM | POA: Insufficient documentation

## 2024-07-05 DIAGNOSIS — I1A0 Resistant hypertension: Secondary | ICD-10-CM | POA: Diagnosis not present

## 2024-07-05 DIAGNOSIS — Z91199 Patient's noncompliance with other medical treatment and regimen due to unspecified reason: Secondary | ICD-10-CM | POA: Diagnosis not present

## 2024-07-05 DIAGNOSIS — I25119 Atherosclerotic heart disease of native coronary artery with unspecified angina pectoris: Secondary | ICD-10-CM

## 2024-07-05 DIAGNOSIS — I2582 Chronic total occlusion of coronary artery: Secondary | ICD-10-CM | POA: Insufficient documentation

## 2024-07-05 DIAGNOSIS — Z7982 Long term (current) use of aspirin: Secondary | ICD-10-CM | POA: Diagnosis not present

## 2024-07-05 DIAGNOSIS — I2510 Atherosclerotic heart disease of native coronary artery without angina pectoris: Secondary | ICD-10-CM | POA: Insufficient documentation

## 2024-07-05 MED ORDER — CARVEDILOL 3.125 MG PO TABS: 3.1250 mg | ORAL_TABLET | Freq: Two times a day (BID) | ORAL | 11 refills | Status: AC

## 2024-07-05 NOTE — Patient Instructions (Addendum)
 Thank you for coming in today  If you had labs drawn today, any labs that are abnormal the clinic will call you No news is good news  Medications: Restart Coreg  3.125 mg 1 tablet twice daily  Chest xray today   Follow up appointments:  Your physician recommends that you schedule a follow-up appointment in:  6 months With Dr. Rolan Please call our office to schedule the follow-up appointment in May for July 2026.    Do the following things EVERYDAY: Weigh yourself in the morning before breakfast. Write it down and keep it in a log. Take your medicines as prescribed Eat low salt foods--Limit salt (sodium) to 2000 mg per day.  Stay as active as you can everyday Limit all fluids for the day to less than 2 liters   At the [facility], you and your health needs are our priority. As part of our continuing mission to provide you with exceptional heart care, we have created designated Provider Care Teams. These Care Teams include your primary Cardiologist (physician) and Advanced Practice Providers (APPs- Physician Assistants and Nurse Practitioners) who all work together to provide you with the care you need, when you need it.   You may see any of the following providers on your designated Care Team at your next follow up: Dr Toribio Fuel Dr Ezra Rolan Dr. Ria Gardenia Greig Lenetta, NP Caffie Shed, [place] [facility], [place] Beckey Coe, NP Tinnie Redman, PharmD   Please be sure to bring in all your medications bottles to every appointment.    Thank you for choosing  [facility]  If you have any questions or concerns before your next appointment please send us  a message through [place] or call our office at [phone].    TO LEAVE A MESSAGE FOR THE NURSE SELECT OPTION 2, PLEASE LEAVE A MESSAGE INCLUDING: YOUR NAME DATE OF BIRTH CALL BACK NUMBER REASON FOR CALL**this is important as we  prioritize the call backs  YOU WILL RECEIVE A CALL BACK THE SAME DAY AS LONG AS YOU CALL BEFORE 4:00 PM

## 2024-07-07 ENCOUNTER — Ambulatory Visit (HOSPITAL_COMMUNITY): Payer: Self-pay | Admitting: Family Medicine

## 2024-07-11 ENCOUNTER — Other Ambulatory Visit (HOSPITAL_COMMUNITY): Payer: Self-pay | Admitting: Cardiology

## 2024-07-14 ENCOUNTER — Observation Stay (HOSPITAL_COMMUNITY)
Admission: EM | Admit: 2024-07-14 | Discharge: 2024-07-16 | Disposition: A | Attending: Internal Medicine | Admitting: Internal Medicine

## 2024-07-14 ENCOUNTER — Emergency Department (HOSPITAL_COMMUNITY)

## 2024-07-14 ENCOUNTER — Encounter (HOSPITAL_COMMUNITY): Payer: Self-pay

## 2024-07-14 ENCOUNTER — Other Ambulatory Visit: Payer: Self-pay

## 2024-07-14 DIAGNOSIS — I132 Hypertensive heart and chronic kidney disease with heart failure and with stage 5 chronic kidney disease, or end stage renal disease: Secondary | ICD-10-CM | POA: Insufficient documentation

## 2024-07-14 DIAGNOSIS — N1832 Chronic kidney disease, stage 3b: Secondary | ICD-10-CM

## 2024-07-14 DIAGNOSIS — I10 Essential (primary) hypertension: Secondary | ICD-10-CM | POA: Diagnosis not present

## 2024-07-14 DIAGNOSIS — I5033 Acute on chronic diastolic (congestive) heart failure: Secondary | ICD-10-CM | POA: Insufficient documentation

## 2024-07-14 DIAGNOSIS — N186 End stage renal disease: Secondary | ICD-10-CM | POA: Diagnosis not present

## 2024-07-14 DIAGNOSIS — A0471 Enterocolitis due to Clostridium difficile, recurrent: Principal | ICD-10-CM | POA: Diagnosis present

## 2024-07-14 DIAGNOSIS — E1122 Type 2 diabetes mellitus with diabetic chronic kidney disease: Secondary | ICD-10-CM | POA: Diagnosis not present

## 2024-07-14 DIAGNOSIS — E66812 Obesity, class 2: Secondary | ICD-10-CM | POA: Diagnosis not present

## 2024-07-14 DIAGNOSIS — Z992 Dependence on renal dialysis: Secondary | ICD-10-CM | POA: Insufficient documentation

## 2024-07-14 DIAGNOSIS — Z79899 Other long term (current) drug therapy: Secondary | ICD-10-CM | POA: Insufficient documentation

## 2024-07-14 DIAGNOSIS — Z8673 Personal history of transient ischemic attack (TIA), and cerebral infarction without residual deficits: Secondary | ICD-10-CM | POA: Insufficient documentation

## 2024-07-14 DIAGNOSIS — Z789 Other specified health status: Secondary | ICD-10-CM | POA: Diagnosis not present

## 2024-07-14 DIAGNOSIS — I5032 Chronic diastolic (congestive) heart failure: Secondary | ICD-10-CM | POA: Diagnosis present

## 2024-07-14 DIAGNOSIS — Z794 Long term (current) use of insulin: Secondary | ICD-10-CM | POA: Diagnosis not present

## 2024-07-14 DIAGNOSIS — E1142 Type 2 diabetes mellitus with diabetic polyneuropathy: Secondary | ICD-10-CM

## 2024-07-14 DIAGNOSIS — E119 Type 2 diabetes mellitus without complications: Secondary | ICD-10-CM

## 2024-07-14 DIAGNOSIS — Z7982 Long term (current) use of aspirin: Secondary | ICD-10-CM | POA: Insufficient documentation

## 2024-07-14 DIAGNOSIS — Z6838 Body mass index (BMI) 38.0-38.9, adult: Secondary | ICD-10-CM | POA: Insufficient documentation

## 2024-07-14 DIAGNOSIS — E1140 Type 2 diabetes mellitus with diabetic neuropathy, unspecified: Secondary | ICD-10-CM | POA: Diagnosis present

## 2024-07-14 DIAGNOSIS — I2510 Atherosclerotic heart disease of native coronary artery without angina pectoris: Secondary | ICD-10-CM | POA: Insufficient documentation

## 2024-07-14 LAB — C DIFFICILE QUICK SCREEN W PCR REFLEX
C Diff antigen: POSITIVE — AB
C Diff interpretation: DETECTED
C Diff toxin: POSITIVE — AB

## 2024-07-14 LAB — CBC WITH DIFFERENTIAL/PLATELET
Abs Immature Granulocytes: 0.04 10*3/uL (ref 0.00–0.07)
Basophils Absolute: 0 10*3/uL (ref 0.0–0.1)
Basophils Relative: 0 %
Eosinophils Absolute: 0 10*3/uL (ref 0.0–0.5)
Eosinophils Relative: 0 %
HCT: 36.4 % (ref 36.0–46.0)
Hemoglobin: 11.9 g/dL — ABNORMAL LOW (ref 12.0–15.0)
Immature Granulocytes: 1 %
Lymphocytes Relative: 7 %
Lymphs Abs: 0.5 10*3/uL — ABNORMAL LOW (ref 0.7–4.0)
MCH: 26.7 pg (ref 26.0–34.0)
MCHC: 32.7 g/dL (ref 30.0–36.0)
MCV: 81.8 fL (ref 80.0–100.0)
Monocytes Absolute: 0.7 10*3/uL (ref 0.1–1.0)
Monocytes Relative: 9 %
Neutro Abs: 6.3 10*3/uL (ref 1.7–7.7)
Neutrophils Relative %: 83 %
Platelets: 213 10*3/uL (ref 150–400)
RBC: 4.45 MIL/uL (ref 3.87–5.11)
RDW: 18 % — ABNORMAL HIGH (ref 11.5–15.5)
WBC: 7.6 10*3/uL (ref 4.0–10.5)
nRBC: 0 % (ref 0.0–0.2)

## 2024-07-14 LAB — COMPREHENSIVE METABOLIC PANEL WITH GFR
ALT: 11 U/L (ref 0–44)
AST: 29 U/L (ref 15–41)
Albumin: 4 g/dL (ref 3.5–5.0)
Alkaline Phosphatase: 80 U/L (ref 38–126)
Anion gap: 19 — ABNORMAL HIGH (ref 5–15)
BUN: 87 mg/dL — ABNORMAL HIGH (ref 8–23)
CO2: 24 mmol/L (ref 22–32)
Calcium: 9.7 mg/dL (ref 8.9–10.3)
Chloride: 94 mmol/L — ABNORMAL LOW (ref 98–111)
Creatinine, Ser: 8.46 mg/dL — ABNORMAL HIGH (ref 0.44–1.00)
GFR, Estimated: 5 mL/min — ABNORMAL LOW
Glucose, Bld: 81 mg/dL (ref 70–99)
Potassium: 4.2 mmol/L (ref 3.5–5.1)
Sodium: 136 mmol/L (ref 135–145)
Total Bilirubin: 0.3 mg/dL (ref 0.0–1.2)
Total Protein: 7.5 g/dL (ref 6.5–8.1)

## 2024-07-14 LAB — GLUCOSE, CAPILLARY
Glucose-Capillary: 70 mg/dL (ref 70–99)
Glucose-Capillary: 132 mg/dL — ABNORMAL HIGH (ref 70–99)

## 2024-07-14 LAB — LIPASE, BLOOD: Lipase: 66 U/L — ABNORMAL HIGH (ref 11–51)

## 2024-07-14 LAB — HEPATITIS B SURFACE ANTIGEN: Hepatitis B Surface Ag: NONREACTIVE

## 2024-07-14 MED ORDER — BUPROPION HCL 100 MG PO TABS
100.0000 mg | ORAL_TABLET | Freq: Two times a day (BID) | ORAL | Status: DC
  Administered 2024-07-14 – 2024-07-16 (×5): 100 mg via ORAL
  Filled 2024-07-14 (×6): qty 1

## 2024-07-14 MED ORDER — INSULIN ASPART 100 UNIT/ML IJ SOLN: 0.0000 [IU] | Freq: Three times a day (TID) | INTRAMUSCULAR | Status: DC

## 2024-07-14 MED ORDER — FIDAXOMICIN 200 MG PO TABS
200.0000 mg | ORAL_TABLET | Freq: Two times a day (BID) | ORAL | Status: DC
  Administered 2024-07-14: 200 mg via ORAL
  Filled 2024-07-14 (×2): qty 1

## 2024-07-14 MED ORDER — CALCITRIOL 0.5 MCG PO CAPS
0.5000 ug | ORAL_CAPSULE | Freq: Every morning | ORAL | Status: DC
  Administered 2024-07-15 – 2024-07-16 (×2): 0.5 ug via ORAL
  Filled 2024-07-14 (×2): qty 1

## 2024-07-14 MED ORDER — SERTRALINE HCL 25 MG PO TABS
50.0000 mg | ORAL_TABLET | Freq: Every day | ORAL | Status: DC
  Administered 2024-07-14 – 2024-07-16 (×3): 50 mg via ORAL
  Filled 2024-07-14 (×3): qty 2

## 2024-07-14 MED ORDER — ONDANSETRON HCL 4 MG PO TABS: 4.0000 mg | ORAL_TABLET | Freq: Four times a day (QID) | ORAL | Status: DC | PRN

## 2024-07-14 MED ORDER — PREGABALIN 75 MG PO CAPS
75.0000 mg | ORAL_CAPSULE | Freq: Two times a day (BID) | ORAL | Status: DC
  Administered 2024-07-14 – 2024-07-16 (×5): 75 mg via ORAL
  Filled 2024-07-14 (×2): qty 1
  Filled 2024-07-14 (×2): qty 3
  Filled 2024-07-14: qty 1

## 2024-07-14 MED ORDER — ISOSORBIDE MONONITRATE ER 30 MG PO TB24
60.0000 mg | ORAL_TABLET | Freq: Every day | ORAL | Status: DC
  Administered 2024-07-15: 60 mg via ORAL
  Filled 2024-07-14 (×2): qty 2

## 2024-07-14 MED ORDER — DICYCLOMINE HCL 10 MG/ML IM SOLN
20.0000 mg | Freq: Once | INTRAMUSCULAR | Status: AC
  Administered 2024-07-14: 20 mg via INTRAMUSCULAR
  Filled 2024-07-14: qty 2

## 2024-07-14 MED ORDER — VANCOMYCIN HCL 125 MG PO CAPS: 125.0000 mg | ORAL_CAPSULE | Freq: Every day | ORAL | Status: DC

## 2024-07-14 MED ORDER — CARVEDILOL 6.25 MG PO TABS
3.1250 mg | ORAL_TABLET | Freq: Two times a day (BID) | ORAL | Status: DC
  Administered 2024-07-14 – 2024-07-15 (×3): 3.125 mg via ORAL
  Filled 2024-07-14 (×4): qty 1

## 2024-07-14 MED ORDER — VANCOMYCIN HCL 125 MG PO CAPS: 125.0000 mg | ORAL_CAPSULE | ORAL | Status: DC

## 2024-07-14 MED ORDER — ACETAMINOPHEN 500 MG PO TABS
1000.0000 mg | ORAL_TABLET | Freq: Once | ORAL | Status: AC
  Administered 2024-07-14: 1000 mg via ORAL
  Filled 2024-07-14: qty 2

## 2024-07-14 MED ORDER — VANCOMYCIN HCL 125 MG PO CAPS
125.0000 mg | ORAL_CAPSULE | Freq: Four times a day (QID) | ORAL | Status: DC
  Administered 2024-07-14 – 2024-07-16 (×7): 125 mg via ORAL
  Filled 2024-07-14 (×11): qty 1

## 2024-07-14 MED ORDER — ACETAMINOPHEN 500 MG PO TABS: 1000.0000 mg | ORAL_TABLET | Freq: Four times a day (QID) | ORAL | Status: DC | PRN

## 2024-07-14 MED ORDER — ACETAMINOPHEN 650 MG RE SUPP: 650.0000 mg | Freq: Four times a day (QID) | RECTAL | Status: DC | PRN

## 2024-07-14 MED ORDER — ONDANSETRON HCL 4 MG/2ML IJ SOLN: 4.0000 mg | Freq: Four times a day (QID) | INTRAMUSCULAR | Status: DC | PRN

## 2024-07-14 MED ORDER — CHLORHEXIDINE GLUCONATE CLOTH 2 % EX PADS
6.0000 | MEDICATED_PAD | Freq: Every day | CUTANEOUS | Status: DC
  Administered 2024-07-14 – 2024-07-16 (×3): 6 via TOPICAL

## 2024-07-14 MED ORDER — INSULIN ASPART 100 UNIT/ML IJ SOLN: 0.0000 [IU] | Freq: Every day | INTRAMUSCULAR | Status: DC

## 2024-07-14 MED ORDER — EZETIMIBE 10 MG PO TABS
10.0000 mg | ORAL_TABLET | Freq: Every day | ORAL | Status: DC
  Administered 2024-07-14 – 2024-07-16 (×3): 10 mg via ORAL
  Filled 2024-07-14 (×3): qty 1

## 2024-07-14 MED ORDER — ATORVASTATIN CALCIUM 80 MG PO TABS
80.0000 mg | ORAL_TABLET | Freq: Every evening | ORAL | Status: DC
  Administered 2024-07-14 – 2024-07-15 (×2): 80 mg via ORAL
  Filled 2024-07-14 (×2): qty 1

## 2024-07-14 MED ORDER — HEPARIN SODIUM (PORCINE) 5000 UNIT/ML IJ SOLN
5000.0000 [IU] | Freq: Three times a day (TID) | INTRAMUSCULAR | Status: DC
  Administered 2024-07-14 – 2024-07-16 (×6): 5000 [IU] via SUBCUTANEOUS
  Filled 2024-07-14 (×6): qty 1

## 2024-07-14 MED ORDER — VANCOMYCIN HCL 125 MG PO CAPS: 125.0000 mg | ORAL_CAPSULE | Freq: Two times a day (BID) | ORAL | Status: DC

## 2024-07-14 NOTE — ED Triage Notes (Signed)
 Pt coming in from home complaining of abdominal pain and blood in her stool x 1 day. Pt had c diff 4 weeks ago. Pt reports dizziness but drank water  and is feeling better. Pt has fistula on left side. Pt is attempting to switch to parataenial dialysis at home.   Ems vitals  Cbg 98 Hr 74 Bp 152/65 Spo2 100

## 2024-07-14 NOTE — Progress Notes (Signed)
 Pt receives home therapy care at [facility] home therapy dept on [address]. Pt had been home hemo but has recently been doing PD training. Home therapy staff aware of pt's hospital admission. Will assist as needed.   Randine Mungo Dialysis Navigator [phone]

## 2024-07-14 NOTE — Assessment & Plan Note (Signed)
 Nephrology consult. 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 Nephrology to decide. She has LUE fistula for HD. She was on home hemodialysis on M, T, Th, Friday due to her inability to tolerate regular 4 hour 3 days a week sessions.

## 2024-07-14 NOTE — Care Management (Signed)
 Attempted to give Code 44 no answer left message. Called daughter as well no answer.

## 2024-07-14 NOTE — Assessment & Plan Note (Signed)
 Continue lyrica .

## 2024-07-14 NOTE — Assessment & Plan Note (Signed)
 Admit to med/tele bed. Dificid  restarted. ID consulted.  ??candidate for fecal microbiota transplant??

## 2024-07-14 NOTE — ED Provider Notes (Signed)
 "  Emergency Department Provider Note   I have reviewed the triage vital signs and the nursing notes.   HISTORY  Chief Complaint Abdominal Pain   HPI Maria Hudson is a 64 y.o. female with past history of CAD, CHF, diabetes, ESRD on hemodialysis presents to the emergency department with abdominal pain in the setting of return of diarrhea symptoms.  She completed treatment for C. difficile 1 month prior and symptoms largely resolved.  Last night she developed a return of diarrhea with some streaks of blood and abdominal pain.  No fevers.  No vomiting.  No pain into the chest or nausea.  She is in training to start peritoneal dialysis but continues to be on hemodialysis for the time being.    Past Medical History:  Diagnosis Date   Acute on chronic diastolic heart failure (HCC) 03/05/2021   Anemia    Arthritis    C. 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 Respiratory: Denies shortness of breath. Gastrointestinal: Positive abdominal pain.  No nausea, no vomiting. Positive diarrhea.  No constipation. Musculoskeletal: Negative for back pain. Neurological: Negative for headaches.  ____________________________________________   PHYSICAL EXAM:  VITAL SIGNS: ED Triage Vitals  Encounter Vitals  Group     BP 07/14/24 0649 (!) 153/60     Pulse Rate 07/14/24 0649 76     Resp 07/14/24 0649 16     Temp 07/14/24 0656 98.2 F (36.8 C)     Temp src --      SpO2 07/14/24 0649 100 %     Weight 07/14/24 0651 220 lb (99.8 kg)     Height 07/14/24 0651 5' 3 (1.6 m)   Constitutional: Alert and oriented. Well appearing and in no acute distress. Eyes: Conjunctivae are normal. Head: Atraumatic. Nose: No congestion/rhinnorhea. Mouth/Throat: Mucous membranes are moist.  Neck: No stridor.   Cardiovascular: Normal rate, regular rhythm. Good peripheral circulation. Grossly normal heart sounds.   Respiratory: Normal respiratory effort.  No retractions. Lungs CTAB. Gastrointestinal: Soft and nontender. No distention.  Musculoskeletal: No lower extremity tenderness nor edema. No gross deformities of extremities. Neurologic:  Normal speech and language. No gross focal neurologic deficits are appreciated.  Skin:  Skin is warm, dry and intact. No rash noted.  ____________________________________________   LABS (all labs ordered are listed, but only abnormal results are displayed)  Labs Reviewed  C DIFFICILE QUICK SCREEN W PCR REFLEX   - Abnormal; Notable for the following components:      Result Value   C Diff antigen POSITIVE (*)    C Diff toxin POSITIVE (*)    All other components within normal limits  COMPREHENSIVE METABOLIC PANEL WITH GFR - Abnormal; Notable for the  following components:   Chloride 94 (*)    BUN 87 (*)    Creatinine, Ser 8.46 (*)    GFR, Estimated 5 (*)    Anion gap 19 (*)    All other components within normal limits  LIPASE, BLOOD - Abnormal; Notable for the following components:   Lipase 66 (*)    All other components within normal limits  CBC WITH DIFFERENTIAL/PLATELET - Abnormal; Notable for the following components:   Hemoglobin 11.9 (*)    RDW 18.0 (*)    Lymphs Abs 0.5 (*)    All other components within normal limits    ____________________________________________  EKG   EKG Interpretation Date/Time:  Friday July 14 2024 06:52:47 EST Ventricular Rate:  76 PR Interval:  209 QRS Duration:  101 QT Interval:  383 QTC Calculation: 431 R Axis:   -34  Text Interpretation: Sinus rhythm Left ventricular hypertrophy No STEMI Confirmed by Darra Chew [phone]) on 07/14/2024 7:53:33 AM        ____________________________________________  RADIOLOGY  CT ABDOMEN PELVIS WO CONTRAST Result Date: 07/14/2024 EXAM: CT ABDOMEN AND PELVIS WITHOUT CONTRAST 07/14/2024 07:50:05 AM TECHNIQUE: CT of the abdomen and pelvis was performed without the administration of intravenous contrast. Multiplanar reformatted images are provided for review. Automated exposure control, iterative reconstruction, and/or weight-based adjustment of the mA/kV was utilized to reduce the radiation dose to as low as reasonably achievable. COMPARISON: 06/16/2024 CLINICAL HISTORY: Abdominal pain, acute, nonlocalized. Acute, nonlocalized abdominal pain. FINDINGS: LOWER CHEST: Scarring and cylindrical bronchiectasis are identified within the right lower lobe. LIVER: The liver is unremarkable. GALLBLADDER AND BILE DUCTS: Tiny stones are noted layering within the gallbladder. No gallbladder wall thickening or surrounding inflammatory change. No biliary ductal dilatation. SPLEEN: No acute abnormality. PANCREAS: No acute abnormality. ADRENAL GLANDS: Normal right adrenal gland. Measures 8 Hounsfield units and 1.7 cm, image 21/4. Per ACR Incidental Adrenal Findings Committee recommendations, this is compatible with a lipid-rich adenoma, and no follow-up imaging is recommended. KIDNEYS, URETERS AND BLADDER: No stones in the kidneys or ureters. No hydronephrosis. No perinephric or periureteral stranding. Urinary bladder is unremarkable. GI AND BOWEL: Small hiatal hernia. The stomach is nondistended. There is no pathologic dilatation of the bowel loops. The appendix  is visualized and appears normal. Assessment: Bowel pathology is diminished due to lack of oral and IV contrast material. Equivocal wall thickening of the cecum and descending colon up to the rectum. No significant pericolonic soft tissue stranding. No signs of pneumatosis. PERITONEUM AND RETROPERITONEUM: No ascites or focal fluid collections within the abdomen or pelvis. The peritoneal dialysis catheter is identified, which enters from a left lower quadrant approach and terminates in the pelvis. No free air. VASCULATURE: Aorta is normal in caliber. Aortic atherosclerotic calcification. LYMPH NODES: No abdominal or pelvic adenopathy. REPRODUCTIVE ORGANS: Status post hysterectomy. No adnexal mass. BONES AND SOFT TISSUES: No acute osseous abnormality. No focal soft tissue abnormality. IMPRESSION: 1. Equivocal colonic wall thickening from the cecum and descending colon to the rectum, which may reflect colitis (infectious, inflammatory, or ischemic), with evaluation limited by lack of oral and IV contrast. If symptoms persist or worsen, consider repeat CT abdomen/pelvis with IV contrast (and oral contrast if feasible) or colonoscopy as clinically appropriate. Electronically signed by: Waddell Calk MD 07/14/2024 08:05 AM EST RP Workstation: [id number]    ____________________________________________   PROCEDURES  Procedure(s) performed:   Procedures  None  ____________________________________________   INITIAL IMPRESSION / ASSESSMENT AND PLAN / ED COURSE  Pertinent labs & imaging results  that were available during my care of the patient were reviewed by me and considered in my medical decision making (see chart for details).   This patient is Presenting for Evaluation of abdominal pain, which does require a range of treatment options, and is a complaint that involves a high risk of morbidity and mortality.  The Differential Diagnoses includes but is not exclusive to acute cholecystitis,  intrathoracic causes for epigastric abdominal pain, gastritis, duodenitis, pancreatitis, small bowel or large bowel obstruction, abdominal aortic aneurysm, hernia, gastritis, etc.   Critical Interventions-    Medications  fidaxomicin  (DIFICID ) tablet 200 mg (200 mg Oral Given 07/14/24 1018)  dicyclomine  (BENTYL ) injection 20 mg (20 mg Intramuscular Given 07/14/24 0822)  acetaminophen  (TYLENOL ) tablet 1,000 mg (1,000 mg Oral Given 07/14/24 1018)    Reassessment after intervention: pain improved.    I decided to review pertinent External Data, and in summary recent diagnosis and treatment of c diff. Discharged on 06/25/24.   Clinical Laboratory Tests Ordered, included CBC without leukocytosis. Creatinine consistent with ESRD. Normal K. Lipase minimally elevated. C diff antigen and toxin positive.   Radiologic Tests Ordered, included CT abdomen/pelvis. I independently interpreted the images and agree with radiology interpretation.   Cardiac Monitor Tracing which shows NSR.    Social Determinants of Health Risk patient is a non-smoker.   Consult complete with Nephrology, Dr. Melia. They will coordinate with patient for HD.   TRH, Dr. Laurence. Plan for admit.   Medical Decision Making: Summary:  Patient presents emergency department for evaluation of abdominal pain and return of diarrhea symptoms.  Recently treated with Dificid  for c diff.   Reevaluation with update and discussion with patient. Continues to have frequent diarrhea and abdominal pain. She is high risk for complication in the setting of ESRD. Plan for admit with recurrent c diff.   Patient's presentation is most consistent with acute presentation with potential threat to life or bodily function.   Disposition: admit  ____________________________________________  FINAL CLINICAL IMPRESSION(S) / ED DIAGNOSES  Final diagnoses:  Recurrent Clostridioides difficile diarrhea     Note:  This document was prepared using Dragon  voice recognition software and may include unintentional dictation errors.  Fonda Law, MD, [facility] Emergency Medicine    Analiz Tvedt, Fonda MATSU, MD 07/14/24 1039  "

## 2024-07-14 NOTE — ED Notes (Signed)
 Back from CT, no changes, C.diff resulted positive, [facility] Pt alert, NAD, calm, interactive, no changes.

## 2024-07-14 NOTE — Plan of Care (Signed)
" °  Problem: Education: Goal: Knowledge of General Education information will improve Description: Including pain rating scale, medication(s)/side effects and non-pharmacologic comfort measures Outcome: Progressing   Problem: Health Behavior/Discharge Planning: Goal: Ability to manage health-related needs will improve Outcome: Progressing   Problem: Education: Goal: Knowledge of General Education information will improve Description: Including pain rating scale, medication(s)/side effects and non-pharmacologic comfort measures Outcome: Progressing   Problem: Clinical Measurements: Goal: Ability to maintain clinical measurements within normal limits will improve Outcome: Progressing Goal: Will remain free from infection Outcome: Progressing Goal: Diagnostic test results will improve Outcome: Progressing Goal: Respiratory complications will improve Outcome: Progressing Goal: Cardiovascular complication will be avoided Outcome: Progressing   Problem: Coping: Goal: Level of anxiety will decrease Outcome: Progressing   Problem: Elimination: Goal: Will not experience complications related to bowel motility Outcome: Progressing Goal: Will not experience complications related to urinary retention Outcome: Progressing   Problem: Pain Managment: Goal: General experience of comfort will improve and/or be controlled Outcome: Progressing   Problem: Coping: Goal: Ability to adjust to condition or change in health will improve Outcome: Progressing   Problem: Fluid Volume: Goal: Ability to maintain a balanced intake and output will improve Outcome: Progressing   Problem: Tissue Perfusion: Goal: Adequacy of tissue perfusion will improve Outcome: Progressing   "

## 2024-07-14 NOTE — Assessment & Plan Note (Signed)
 Hold ozempic . Add SSI. Pt states her appetite has been poor this week.

## 2024-07-14 NOTE — Consult Note (Addendum)
 "                                                                  [facility]                                                                                        Patient Identification: Patient Name: Maria Hudson MRN: [id number] Admit Date: 07/14/2024  6:47 AM Today's Date: 07/14/2024 Reason for consult: Recurrent C. difficile diarrhea Requesting provider: Dr. Laurence  Principal Problem:   Recurrent Clostridioides difficile diarrhea Active Problems:   T2DM (type 2 diabetes mellitus) (HCC)   Obesity, Class II, BMI 35-39.9   Chronic diastolic CHF (congestive heart failure) (HCC)   Diabetic neuropathy (HCC)   Refusal of blood product - pt is Jehovah's witness   Essential hypertension   ESRD on dialysis (HCC)   Antibiotics:  Fidaxomicin  1/30  Lines/Hardware:  Assessment # Recurrent C diff diarrhea, colitis, nonsevere - First episode early July 2026 treated with course of fidaxomicin  with resolution of symptoms - No signs of ileus megacolon or shock  # ESRD on HD via Left AVF - She is on training to transition to PD  Recommendations  - Will switch to p.o. fidaxomicin  to p.o. vancomycin , prolonged tapered course as below 125 mg orally 4 times daily for 10 days, then 125 mg orally twice daily for 7 days, then 125 mg orally once daily for 7 days, then 125 mg orally every 2 to 3 days for 2  weeks - She will benefit from traditional FMT/FMP ( rebyota or vowst) transplantation if further recurrence of C. difficile diarrhea and I discussed to follow up with GI preferably at tertiary center.  - Maintain contact precautions D/w primary team  ID will sign off  Rest of the management as per the primary team. Please call with questions or concerns.  Thank you for the consult  __________________________________________________________________________________________________________ HPI and Hospital Course: 64 year old female with prior history as below  including CHF, CAD, CVA, DM2, ESRD s/p AVF, CVA, HLD, OSA, Depression, recent C. difficile colitis in Jan 2026 who presented to the ED 1/30 with abdominal cramps, watery stool with blood starting midnight including dizziness.  She had to go to the restroom every 30 minutes.  She reports having chronic constipation and using Linzess  to have 1-2 bowel movements every day. Denies fever, chills, nausea or vomiting.  No reported chest pain or cough.  She is training to start peritoneal dialysis but currently on HD.  She stopped taking Linzess  5 days ago.   She was recently admitted and discharged on January 11 for C. difficile diarrhea, discharged on 7 days of fidaxomicin  with resolution of symptoms   At ED afebrile, stable vitals C. difficile antigen and toxin positive No significant abnormality in CBC and CMP  ROS: General- Denies fever, chills, loss of appetite and loss of weight HEENT - Denies headache,  blurry vision, neck pain, sinus pain Chest - Denies any chest pain, SOB or cough CVS- Denies any dizziness/lightheadedness, syncopal attacks, palpitations Abdomen- Denies any nausea, vomiting, abdominal pain Neuro - Denies any weakness, numbness, tingling sensation Psych - Denies any changes in mood irritability or depressive symptoms GU- Denies any burning, dysuria, hematuria or increased frequency of urination Skin - denies any rashes/lesions MSK - denies any joint pain/swelling or restricted ROM   Past Medical History:  Diagnosis Date   Acute on chronic diastolic heart failure (HCC) 03/05/2021   Anemia    Arthritis    C. 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   Stroke [facility])    Past Surgical History:  Procedure Laterality Date   A/V FISTULAGRAM Left 08/09/2023   Procedure: A/V Fistulagram;  Surgeon: Sheree Penne Bruckner, MD;  Location: [facility] INVASIVE CV LAB;  Service: Cardiovascular;  Laterality: Left;   A/V FISTULAGRAM Left 03/15/2024   Procedure: A/V Fistulagram;  Surgeon: Pearline Norman RAMAN, MD;  Location: [facility] PV LAB;  Service: Cardiovascular;  Laterality: Left;   A/V SHUNT INTERVENTION N/A 12/15/2023   Procedure: A/V SHUNT INTERVENTION;  Surgeon: Pearline Norman RAMAN, MD;  Location: [facility];  Service: Cardiovascular;  Laterality: N/A;   A/V SHUNT INTERVENTION Left 01/21/2024   Procedure: A/V SHUNT INTERVENTION;  Surgeon: Serene Gaile ORN, MD;  Location: [facility];  Service: Cardiovascular;  Laterality: Left;   ABDOMINAL HYSTERECTOMY     AV FISTULA PLACEMENT Left 12/09/2021   Procedure: LEFT ARTERIOVENOUS (AV) FISTULA CREATION;  Surgeon: Sheree Penne Bruckner, MD;  Location: [facility] OR;  Service: Vascular;  Laterality: Left;   BIOPSY  09/08/2021   Procedure: BIOPSY;  Surgeon: Legrand Victory LITTIE DOUGLAS, MD;  Location: [facility] ENDOSCOPY;  Service: Gastroenterology;;   CESAREAN SECTION     x 1   ESOPHAGOGASTRODUODENOSCOPY (EGD) WITH PROPOFOL  N/A 09/08/2021   Procedure: ESOPHAGOGASTRODUODENOSCOPY (EGD) WITH PROPOFOL ;  Surgeon: Legrand Victory LITTIE DOUGLAS, MD;  Location: [facility];  Service: Gastroenterology;  Laterality: N/A;  chest pain, epigastric pain   FISTULA SUPERFICIALIZATION Left 08/11/2023   Procedure: FISTULA SUPERFICIALIZATION LEFT UPPER ARM;  Surgeon: Magda Debby SAILOR, MD;  Location: [facility] OR;  Service: Vascular;  Laterality: Left;   INNER EAR SURGERY Bilateral    INSERTION OF DIALYSIS CATHETER Right  08/11/2023   Procedure: INSERTION OF DIALYSIS CATHETER;  Surgeon: Magda Debby SAILOR, MD;  Location: [facility] OR;  Service: Vascular;  Laterality: Right;   LAPAROSCOPIC LYSIS OF ADHESIONS N/A 06/02/2024   Procedure: LYSIS, ADHESIONS, LAPAROSCOPIC;  Surgeon: Sheree Penne Bruckner, MD;  Location: [facility] OR;  Service: Vascular;  Laterality: N/A;   RIGHT HEART CATH N/A 05/14/2021   Procedure: RIGHT HEART CATH;  Surgeon: Rolan Ezra RAMAN, MD;  Location: [facility] INVASIVE CV LAB;  Service: Cardiovascular;  Laterality: N/A;   TENCKHOFF CATHETER INSERTION  N/A 06/02/2024   Procedure: INSERTION, CATHETER, DIALYSIS, PERITONEAL;  Surgeon: Sheree Penne Bruckner, MD;  Location: [facility] OR;  Service: Vascular;  Laterality: N/A;   TONSILLECTOMY     age 22   VENOUS ANGIOPLASTY  01/21/2024   Procedure: VENOUS ANGIOPLASTY;  Surgeon: Serene Gaile ORN, MD;  Location: [facility];  Service: Cardiovascular;;  Cephalic Vein   VENOUS ANGIOPLASTY Left 03/15/2024   Procedure: VENOUS ANGIOPLASTY;  Surgeon: Pearline Norman RAMAN, MD;  Location: [facility];  Service: Cardiovascular;  Laterality: Left;  Mid-AVF/Cephalic Vein; Cephalic Arch    Scheduled Meds:  atorvastatin   80 mg Oral QPM   buPROPion   100 mg Oral BID   [START ON 07/15/2024] calcitRIOL   0.5 mcg Oral q AM   carvedilol   3.125 mg Oral BID   Chlorhexidine  Gluconate Cloth  6 each Topical Q0600   ezetimibe   10 mg Oral Daily   fidaxomicin   200 mg Oral BID   heparin   5,000 Units Subcutaneous Q8H   insulin  aspart  0-5 Units Subcutaneous QHS   insulin  aspart  0-6 Units Subcutaneous TID WC   isosorbide  mononitrate  60 mg Oral QHS   pregabalin   75 mg Oral BID   sertraline   50 mg Oral Daily   Continuous Infusions: PRN Meds:.acetaminophen  **OR** acetaminophen , ondansetron  **OR** ondansetron  (ZOFRAN ) IV  Allergies[1]  Social History   Socioeconomic History   Marital status: Divorced    Spouse name: Not on file   Number of children: 2   Years of education: Not on file   Highest  education level: Some college, no degree  Occupational History   Occupation: retired   Occupation: disabled  Tobacco Use   Smoking status: Never    Passive exposure: Never   Smokeless tobacco: Never  Vaping Use   Vaping status: Never Used  Substance and Sexual Activity   Alcohol use: Not Currently   Drug use: Never   Sexual activity: Not Currently    Birth control/protection: Post-menopausal, Surgical  Other Topics Concern   Not on file  Social History Narrative   Not on file   Social Drivers of Health   Tobacco Use: Low Risk (07/14/2024)   Patient History    Smoking Tobacco Use: Never    Smokeless Tobacco Use: Never    Passive Exposure: Never  Financial Resource Strain: Low Risk (05/31/2023)   [facility]   Overall Financial Resource Strain (CARDIA)    Difficulty of Paying Living Expenses: Not very hard  Food Insecurity: No Food Insecurity (07/14/2024)   Epic    Worried About Radiation Protection Practitioner of Food in the Last Year: Never true    [employer] of Food in the Last Year: Never true  Transportation Needs: No Transportation Needs (07/14/2024)   Epic    Lack of Transportation (Medical): No    Lack of Transportation (Non-Medical): No  Physical Activity: Inactive (08/23/2023)   Exercise Vital Sign    Days of Exercise per Week: 0 days    Minutes of Exercise per Session: 0 min  Stress: No Stress Concern Present (08/23/2023)   [employer] of Occupational Health - Occupational Stress Questionnaire    Feeling of Stress : Only a little  Social Connections: Moderately Isolated (08/17/2023)   Social Connection and Isolation Panel    Frequency of Communication with Friends and Family: More than three times a week    Frequency of Social Gatherings with Friends and Family: More than three times a week    Attends Religious  Services: More than 4 times per year    Active Member of Clubs or Organizations: No    Attends Banker Meetings: Never    Marital Status:  Divorced  Catering Manager Violence: Not At Risk (07/14/2024)   Epic    Fear of Current or Ex-Partner: No    Emotionally Abused: No    Physically Abused: No    Sexually Abused: No  Depression (PHQ2-9): Low Risk (05/12/2023)   Depression (PHQ2-9)    PHQ-2 Score: 0  Alcohol Screen: Low Risk (06/17/2023)   Alcohol Screen    Last Alcohol Screening Score (AUDIT): 0  Housing: Low Risk (07/14/2024)   Epic    Unable to Pay for Housing in the Last Year: No    Number of Times Moved in the Last Year: 0    Homeless in the Last Year: No  Utilities: Not At Risk (07/14/2024)   Epic    Threatened with loss of utilities: No  Health Literacy: Adequate Health Literacy (07/19/2023)   B1300 Health Literacy    Frequency of need for help with medical instructions: Never   Family History  Problem Relation Age of Onset   Hypertension Mother    Stroke Mother    Diabetes Mother    Other Mother        enlarged heart   Hypertension Father    Hypertension Sister    Diabetes Sister    CAD Sister        enlarged heart   Kidney disease Sister    Diabetes Paternal Grandmother    Hypertension Daughter    Diabetes Daughter    Hypertension Son    Diabetes Son    Breast cancer Paternal Aunt     Vitals BP 133/72 (BP Location: Right Arm)   Pulse 79   Temp 98.4 F (36.9 C)   Resp 19   Ht 5' 3 (1.6 m)   Wt 99.8 kg   SpO2 100%   BMI 38.97 kg/m    Physical Exam Constitutional: Awake, alert, nontoxic appearing sitting at the edge of the bed    Comments: HEENT WNL  Cardiovascular:     Rate and Rhythm: Normal rate    Heart sounds: s1s2  Pulmonary:     Effort: Pulmonary effort is normal    Comments: Normal breath sounds  Abdominal:     Palpations: Abdomen is soft.     Tenderness: Nondistended and nontender, PD catheter intact  Musculoskeletal:        General: No swelling or tenderness in peripheral joints  Skin:    Comments: Left upper extremity aVF okay  Neurological:     General: Awake,  alert and oriented, ambulatory  Psychiatric:        Mood and Affect: Mood normal.    Pertinent Microbiology Results for orders placed or performed during the hospital encounter of 07/14/24  C Difficile Quick Screen w PCR reflex     Status: Abnormal   Collection Time: 07/14/24  7:16 AM   Specimen: STOOL  Result Value Ref Range Status   C Diff antigen POSITIVE (A) NEGATIVE Final   C Diff toxin POSITIVE (A) NEGATIVE Final   C Diff interpretation Toxin producing C. difficile detected.  Final    Comment: CRITICAL RESULT CALLED TO, READ BACK BY AND VERIFIED WITH: RN NORLEEN AHLE [id number] AT [phone] BY EC Performed at [facility], [address] [place], [place] [address]     Pertinent Lab seen by me:  Latest Ref Rng & Units 07/14/2024    7:16 AM 06/25/2024    8:43 AM 06/24/2024    3:35 AM  CBC  WBC 4.0 - 10.5 K/uL 7.6  9.1  9.4   Hemoglobin 12.0 - 15.0 g/dL 88.0  89.3  9.7   Hematocrit 36.0 - 46.0 % 36.4  31.3  28.4   Platelets 150 - 400 K/uL 213  352  297       Latest Ref Rng & Units 07/14/2024    7:16 AM 06/25/2024    8:43 AM 06/24/2024   11:42 AM  CMP  Glucose 70 - 99 mg/dL 81  861  827   BUN 8 - 23 mg/dL 87  50  33   Creatinine 0.44 - 1.00 mg/dL 1.53  2.50  4.69   Sodium 135 - 145 mmol/L 136  134  133   Potassium 3.5 - 5.1 mmol/L 4.2  3.4  3.0   Chloride 98 - 111 mmol/L 94  94  93   CO2 22 - 32 mmol/L 24  25  28    Calcium  8.9 - 10.3 mg/dL 9.7  9.0  8.8   Total Protein 6.5 - 8.1 g/dL 7.5     Total Bilirubin 0.0 - 1.2 mg/dL 0.3     Alkaline Phos 38 - 126 U/L 80     AST 15 - 41 U/L 29     ALT 0 - 44 U/L 11        Pertinent Imagings/Other Imagings Plain films and CT images have been personally visualized and interpreted; radiology reports have been reviewed. Decision making incorporated into the Impression / Recommendations.  CT ABDOMEN PELVIS WO CONTRAST Result Date: 07/14/2024 EXAM: CT ABDOMEN AND PELVIS WITHOUT CONTRAST 07/14/2024 07:50:05 AM TECHNIQUE: CT of the  abdomen and pelvis was performed without the administration of intravenous contrast. Multiplanar reformatted images are provided for review. Automated exposure control, iterative reconstruction, and/or weight-based adjustment of the mA/kV was utilized to reduce the radiation dose to as low as reasonably achievable. COMPARISON: 06/16/2024 CLINICAL HISTORY: Abdominal pain, acute, nonlocalized. Acute, nonlocalized abdominal pain. FINDINGS: LOWER CHEST: Scarring and cylindrical bronchiectasis are identified within the right lower lobe. LIVER: The liver is unremarkable. GALLBLADDER AND BILE DUCTS: Tiny stones are noted layering within the gallbladder. No gallbladder wall thickening or surrounding inflammatory change. No biliary ductal dilatation. SPLEEN: No acute abnormality. PANCREAS: No acute abnormality. ADRENAL GLANDS: Normal right adrenal gland. Measures 8 Hounsfield units and 1.7 cm, image 21/4. Per ACR Incidental Adrenal Findings Committee recommendations, this is compatible with a lipid-rich adenoma, and no follow-up imaging is recommended. KIDNEYS, URETERS AND BLADDER: No stones in the kidneys or ureters. No hydronephrosis. No perinephric or periureteral stranding. Urinary bladder is unremarkable. GI AND BOWEL: Small hiatal hernia. The stomach is nondistended. There is no pathologic dilatation of the bowel loops. The appendix is visualized and appears normal. Assessment: Bowel pathology is diminished due to lack of oral and IV contrast material. Equivocal wall thickening of the cecum and descending colon up to the rectum. No significant pericolonic soft tissue stranding. No signs of pneumatosis. PERITONEUM AND RETROPERITONEUM: No ascites or focal fluid collections within the abdomen or pelvis. The peritoneal dialysis catheter is identified, which enters from a left lower quadrant approach and terminates in the pelvis. No free air. VASCULATURE: Aorta is normal in caliber. Aortic atherosclerotic calcification.  LYMPH NODES: No abdominal or pelvic adenopathy. REPRODUCTIVE ORGANS: Status post hysterectomy. No adnexal mass. BONES AND SOFT TISSUES: No acute osseous abnormality.  No focal soft tissue abnormality. IMPRESSION: 1. Equivocal colonic wall thickening from the cecum and descending colon to the rectum, which may reflect colitis (infectious, inflammatory, or ischemic), with evaluation limited by lack of oral and IV contrast. If symptoms persist or worsen, consider repeat CT abdomen/pelvis with IV contrast (and oral contrast if feasible) or colonoscopy as clinically appropriate. Electronically signed by: Waddell Calk MD 07/14/2024 08:05 AM EST RP Workstation: [id number]   I personally spent a total of 82 minutes in the care of the patient today including preparing to see the patient, getting/reviewing separately obtained history, performing a medically appropriate exam/evaluation, counseling and educating, placing orders, referring and communicating with other health care professionals, documenting clinical information in the EHR, independently interpreting results, communicating results, and coordinating care.   Annalee Orem, MD Infectious Disease Physician [facility] for Infectious Disease Pager: [phone]      [1]  Allergies Allergen Reactions   Metformin And Related Nausea Only   Other     Clonidine  patch causes skin irritation    Hydrocodone Itching    Patient able to tolerate when taken with Benadryl.   Oxycodone -Acetaminophen  Itching    Patient able to tolerate when taken with Benadryl.   "

## 2024-07-14 NOTE — Assessment & Plan Note (Signed)
 Euvolemic right now. Volume status due to be managed by dialysis and nephrology.

## 2024-07-14 NOTE — Assessment & Plan Note (Signed)
Body mass index is 38.97 kg/m.

## 2024-07-14 NOTE — Assessment & Plan Note (Signed)
 Confirmed that pt is a Jehovah's witness and refuses blood transfusions.

## 2024-07-14 NOTE — Hospital Course (Signed)
 CC: diarrhea HPI: 64 year old African-American female with a history of end-stage renal disease on hemodialysis Monday, Tuesday, Thursday and Friday.  She is currently transitioning over to peritoneal dialysis.  She was admitted in the early part of January 2026 for C. difficile colitis/diarrhea.  She was discharged on June 25, 2024.  She completed an additional 7 days of Dificid  at home.  Over the last 5 days, she has noticed increasing wateriness of her stools.  She stopped taking Linzess  that she normally is taking for her chronic constipation 5 days ago.  Last night about 2:30 in the morning, she noticed her beginning to have diarrhea every 30 minutes.  She started having some abdominal cramping as well.  She did not have any fevers.  She states that this felt like when she had her C. difficile diarrhea in the early part of the month and came to the ER for evaluation.  She denies any chest pain, shortness of breath, nausea or vomiting.  She did peritoneal dialysis yesterday as she is learning how to do home peritoneal dialysis.  On arrival to the ER temp 90.2 heart rate 76 blood pressure 153/60 satting 100% on room air.  C. difficile antigen and toxin are both positive  White count 7.6, hemoglobin 11.9, platelets of 213  Lipase is 66  Sodium 136, potassium 4.2, chloride of 94, bicarb 24, BUN of 87, creatinine 8.46, calcium  9.7, glucose of 81  Total protein 7.5, albumin 4.0, AST 29, ALT of 11, alk phos 80, total bili 0.3  CT abdomen pelvis without contrast showed equivocal colonic thickening from the cecum to the descending colon to the rectum which may reflect colitis.  Both infectious disease and nephrology have been consulted.  [facility] hospitalist consulted for admission.  Significant Events: Admitted 07/14/2024 for recurrent C. Diff coliits   Admission Labs: C. difficile antigen and toxin are both positive White count 7.6, hemoglobin 11.9, platelets of 213 Lipase is 66 Sodium  136, potassium 4.2, chloride of 94, bicarb 24, BUN of 87, creatinine 8.46, calcium  9.7, glucose of 81 Total protein 7.5, albumin 4.0, AST 29, ALT of 11, alk phos 80, total bili 0.3  Admission Imaging Studies: CT abd/pelvis Equivocal colonic wall thickening from the cecum and descending colon to the rectum, which may reflect colitis (infectious, inflammatory, or ischemic), with evaluation limited by lack of oral and IV contrast. If symptoms persist or worsen, consider repeat CT abdomen/pelvis with IV contrast (and oral contrast if feasible) or colonoscopy as clinically appropriate.  Significant Labs:   Significant Imaging Studies:   Antibiotic Therapy: Anti-infectives (From admission, onward)    Start     Dose/Rate Route Frequency Ordered Stop   07/14/24 1000  fidaxomicin  (DIFICID ) tablet 200 mg        200 mg Oral 2 times daily 07/14/24 9047 07/24/24 0959       Procedures:   Consultants: Infectious disease nephrology

## 2024-07-14 NOTE — ED Notes (Signed)
 Alert, NAD, calm, interactive, meds given for HA and c.diff. Renal MD into see, at [facility].

## 2024-07-15 ENCOUNTER — Other Ambulatory Visit (HOSPITAL_COMMUNITY): Payer: Self-pay

## 2024-07-15 DIAGNOSIS — A0471 Enterocolitis due to Clostridium difficile, recurrent: Secondary | ICD-10-CM | POA: Diagnosis not present

## 2024-07-15 DIAGNOSIS — E1122 Type 2 diabetes mellitus with diabetic chronic kidney disease: Secondary | ICD-10-CM | POA: Diagnosis not present

## 2024-07-15 DIAGNOSIS — Z992 Dependence on renal dialysis: Secondary | ICD-10-CM | POA: Diagnosis not present

## 2024-07-15 DIAGNOSIS — Z794 Long term (current) use of insulin: Secondary | ICD-10-CM | POA: Diagnosis not present

## 2024-07-15 DIAGNOSIS — N1832 Chronic kidney disease, stage 3b: Secondary | ICD-10-CM | POA: Diagnosis not present

## 2024-07-15 DIAGNOSIS — E66812 Obesity, class 2: Secondary | ICD-10-CM | POA: Diagnosis not present

## 2024-07-15 DIAGNOSIS — I10 Essential (primary) hypertension: Secondary | ICD-10-CM | POA: Diagnosis not present

## 2024-07-15 DIAGNOSIS — E1142 Type 2 diabetes mellitus with diabetic polyneuropathy: Secondary | ICD-10-CM | POA: Diagnosis not present

## 2024-07-15 DIAGNOSIS — I5032 Chronic diastolic (congestive) heart failure: Secondary | ICD-10-CM | POA: Diagnosis not present

## 2024-07-15 DIAGNOSIS — N186 End stage renal disease: Secondary | ICD-10-CM | POA: Diagnosis not present

## 2024-07-15 LAB — COMPREHENSIVE METABOLIC PANEL WITH GFR
ALT: 7 U/L (ref 0–44)
AST: 19 U/L (ref 15–41)
Albumin: 3.8 g/dL (ref 3.5–5.0)
Alkaline Phosphatase: 77 U/L (ref 38–126)
Anion gap: 18 — ABNORMAL HIGH (ref 5–15)
BUN: 100 mg/dL — ABNORMAL HIGH (ref 8–23)
CO2: 23 mmol/L (ref 22–32)
Calcium: 9.3 mg/dL (ref 8.9–10.3)
Chloride: 91 mmol/L — ABNORMAL LOW (ref 98–111)
Creatinine, Ser: 9.9 mg/dL — ABNORMAL HIGH (ref 0.44–1.00)
GFR, Estimated: 4 mL/min — ABNORMAL LOW
Glucose, Bld: 115 mg/dL — ABNORMAL HIGH (ref 70–99)
Potassium: 3.8 mmol/L (ref 3.5–5.1)
Sodium: 132 mmol/L — ABNORMAL LOW (ref 135–145)
Total Bilirubin: 0.3 mg/dL (ref 0.0–1.2)
Total Protein: 7.2 g/dL (ref 6.5–8.1)

## 2024-07-15 LAB — CBC WITH DIFFERENTIAL/PLATELET
Abs Immature Granulocytes: 0.01 10*3/uL (ref 0.00–0.07)
Basophils Absolute: 0 10*3/uL (ref 0.0–0.1)
Basophils Relative: 1 %
Eosinophils Absolute: 0 10*3/uL (ref 0.0–0.5)
Eosinophils Relative: 1 %
HCT: 34.3 % — ABNORMAL LOW (ref 36.0–46.0)
Hemoglobin: 11.3 g/dL — ABNORMAL LOW (ref 12.0–15.0)
Immature Granulocytes: 0 %
Lymphocytes Relative: 12 %
Lymphs Abs: 0.7 10*3/uL (ref 0.7–4.0)
MCH: 26.3 pg (ref 26.0–34.0)
MCHC: 32.9 g/dL (ref 30.0–36.0)
MCV: 80 fL (ref 80.0–100.0)
Monocytes Absolute: 0.6 10*3/uL (ref 0.1–1.0)
Monocytes Relative: 10 %
Neutro Abs: 4.3 10*3/uL (ref 1.7–7.7)
Neutrophils Relative %: 76 %
Platelets: 245 10*3/uL (ref 150–400)
RBC: 4.29 MIL/uL (ref 3.87–5.11)
RDW: 17.9 % — ABNORMAL HIGH (ref 11.5–15.5)
WBC: 5.6 10*3/uL (ref 4.0–10.5)
nRBC: 0 % (ref 0.0–0.2)

## 2024-07-15 LAB — GLUCOSE, CAPILLARY
Glucose-Capillary: 120 mg/dL — ABNORMAL HIGH (ref 70–99)
Glucose-Capillary: 138 mg/dL — ABNORMAL HIGH (ref 70–99)
Glucose-Capillary: 105 mg/dL — ABNORMAL HIGH (ref 70–99)

## 2024-07-15 LAB — HEPATITIS B SURFACE ANTIBODY, QUANTITATIVE: Hep B S AB Quant (Post): 165 m[IU]/mL

## 2024-07-15 MED ORDER — LIDOCAINE HCL (PF) 1 % IJ SOLN: 5.0000 mL | INTRAMUSCULAR | Status: DC | PRN

## 2024-07-15 MED ORDER — HEPARIN SODIUM (PORCINE) 1000 UNIT/ML DIALYSIS: 1000.0000 [IU] | INTRAMUSCULAR | Status: DC | PRN

## 2024-07-15 MED ORDER — VANCOMYCIN HCL 125 MG PO CAPS
ORAL_CAPSULE | ORAL | 0 refills | Status: AC
  Filled 2024-07-15: qty 70, 30d supply, fill #0

## 2024-07-15 MED ORDER — ALTEPLASE 2 MG IJ SOLR: 2.0000 mg | Freq: Once | INTRAMUSCULAR | Status: DC | PRN

## 2024-07-15 MED ORDER — ANTICOAGULANT SODIUM CITRATE 4% (200MG/5ML) IV SOLN: 5.0000 mL | Status: DC | PRN

## 2024-07-15 MED ORDER — PENTAFLUOROPROP-TETRAFLUOROETH EX AERO: 1.0000 | INHALATION_SPRAY | CUTANEOUS | Status: DC | PRN

## 2024-07-15 MED ORDER — LIDOCAINE-PRILOCAINE 2.5-2.5 % EX CREA: 1.0000 | TOPICAL_CREAM | CUTANEOUS | Status: DC | PRN

## 2024-07-15 NOTE — Discharge Summary (Signed)
 " [facility] Physician Discharge Summary   Patient name: Maria Hudson  Admit date:     07/14/2024  Discharge date: 07/15/2024  Attending Physician: LAURENCE Oberon Hehir [[id number]]  Discharge Physician: Camellia Laurence   PCP: Shelda Atlas, MD  Admitted From: Home Disposition:  Home  Recommendations for Outpatient Follow-up:  Follow up with PCP in 1-2 weeks Continue with home hemodialysis on M, T, Thu, Friday. Do not use peritoneal dialysis until cleared by both nephrology and infectious disease offices  Home Health:No Equipment/Devices: None    Discharge Condition:Stable CODE STATUS:FULL Diet recommendation: Renal/Diabetic Fluid Restriction: 1800 ml/day  Hospital Summary: CC: diarrhea HPI: 64 year old African-American female with a history of end-stage renal disease on hemodialysis Monday, Tuesday, Thursday and Friday.  She is currently transitioning over to peritoneal dialysis.  She was admitted in the early part of January 2026 for C. difficile colitis/diarrhea.  She was discharged on June 25, 2024.  She completed an additional 7 days of Dificid  at home.  Over the last 5 days, she has noticed increasing wateriness of her stools.  She stopped taking Linzess  that she normally is taking for her chronic constipation 5 days ago.  Last night about 2:30 in the morning, she noticed her beginning to have diarrhea every 30 minutes.  She started having some abdominal cramping as well.  She did not have any fevers.  She states that this felt like when she had her C. difficile diarrhea in the early part of the month and came to the ER for evaluation.  She denies any chest pain, shortness of breath, nausea or vomiting.  She did peritoneal dialysis yesterday as she is learning how to do home peritoneal dialysis.  On arrival to the ER temp 90.2 heart rate 76 blood pressure 153/60 satting 100% on room air.  C. difficile antigen and toxin are both positive  White count 7.6, hemoglobin 11.9, platelets  of 213  Lipase is 66  Sodium 136, potassium 4.2, chloride of 94, bicarb 24, BUN of 87, creatinine 8.46, calcium  9.7, glucose of 81  Total protein 7.5, albumin 4.0, AST 29, ALT of 11, alk phos 80, total bili 0.3  CT abdomen pelvis without contrast showed equivocal colonic thickening from the cecum to the descending colon to the rectum which may reflect colitis.  Both infectious disease and nephrology have been consulted.  [facility] consulted for admission.  Significant Events: Admitted 07/14/2024 for recurrent C. Diff coliits   Admission Labs: C. difficile antigen and toxin are both positive White count 7.6, hemoglobin 11.9, platelets of 213 Lipase is 66 Sodium 136, potassium 4.2, chloride of 94, bicarb 24, BUN of 87, creatinine 8.46, calcium  9.7, glucose of 81 Total protein 7.5, albumin 4.0, AST 29, ALT of 11, alk phos 80, total bili 0.3  Admission Imaging Studies: CT abd/pelvis Equivocal colonic wall thickening from the cecum and descending colon to the rectum, which may reflect colitis (infectious, inflammatory, or ischemic), with evaluation limited by lack of oral and IV contrast. If symptoms persist or worsen, consider repeat CT abdomen/pelvis with IV contrast (and oral contrast if feasible) or colonoscopy as clinically appropriate.  Significant Labs:   Significant Imaging Studies:   Antibiotic Therapy: Anti-infectives (From admission, onward)    Start     Dose/Rate Route Frequency Ordered Stop   07/14/24 1000  fidaxomicin  (DIFICID ) tablet 200 mg        200 mg Oral 2 times daily 07/14/24 9047 07/24/24 0959       Procedures:  Consultants: Infectious disease nephrology   Hospital Course by Problem: * Recurrent Clostridioides difficile diarrhea 07/14/24 Admit to med/tele bed. Dificid  restarted. ID consulted.  ??candidate for fecal microbiota transplant??  07/15/24 seen by ID consult yesterday. Placed on prolonged 4 week vancomycin  taper. No systemic  illness. No BM during 4 hour HD session today. Pt requesting to go home today. Will arrange for outpatient ID office visit. Pt to stay off of peritoneal dialysis while she is getting treated for C. Diff.   ESRD on dialysis [facility]) 07/14/24 Nephrology consult. 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 Nephrology to decide. She has LUE fistula for HD. She was on home hemodialysis on M, T, Th, Friday due to her inability to tolerate regular 4 hour 3 days a week sessions.  07/15/24 Pt to stay off of peritoneal dialysis while she is getting treated for C. Diff. Pt to continue home hemodialysis on M, Tu, Thu, Friday. F/U with ID and nephrology before restarting peritoneal dialysis.   Essential hypertension 07/14/24 Continue home HTN meds. With hold parameters.  07/15/24 continue home HTN meds.   Refusal of blood product - pt is Jehovah's witness 07/14/24 Confirmed that pt is a Jehovah's witness and refuses blood transfusions.  Diabetic neuropathy (HCC) 07/14/24 Continue lyrica .  07/15/24 continue lyrica    Chronic diastolic CHF (congestive heart failure) (HCC) 07/14/24 Euvolemic right now. Volume status due to be managed by dialysis and nephrology.  07/15/24 stable.   Obesity, Class II, BMI 35-39.9 Body mass index is 38.97 kg/m.   T2DM (type 2 diabetes mellitus) (HCC) 07/14/24 Hold ozempic . Add SSI. Pt states her appetite has been poor this week.  07/15/24 continue insulin  at home. Rec to hold ozempic  while getting treated for c. Diff.      Discharge Diagnoses:  Principal Problem:   Recurrent Clostridioides difficile diarrhea Active Problems:   ESRD on dialysis (HCC)   T2DM (type 2 diabetes mellitus) (HCC)   Obesity, Class II, BMI 35-39.9   Chronic diastolic CHF (congestive heart failure) (HCC)   Diabetic neuropathy (HCC)   Refusal of blood product - pt is Jehovah's witness   Essential  hypertension   Discharge Instructions  Discharge Instructions     Ambulatory referral to Infectious Disease   Complete by: As directed    Recurrent c. Diff. On po vancomycin  taper   Call MD for:  difficulty breathing, headache or visual disturbances   Complete by: As directed    Call MD for:  extreme fatigue   Complete by: As directed    Call MD for:  hives   Complete by: As directed    Call MD for:  persistant dizziness or light-headedness   Complete by: As directed    Call MD for:  persistant nausea and vomiting   Complete by: As directed    Call MD for:  redness, tenderness, or signs of infection (pain, swelling, redness, odor or green/yellow discharge around incision site)   Complete by: As directed    Call MD for:  severe uncontrolled pain   Complete by: As directed    Call MD for:  temperature >100.4   Complete by: As directed    Diet renal/carb modified with fluid restriction   Complete by: As directed    Discharge instructions   Complete by: As directed    1. Follow up with infectious disease clinic 2. Do not use peritoneal dialysis until your C. Diff diarrhea  infection is gone. You will need to use home hemodialysis like you were doing before   Increase activity slowly   Complete by: As directed    No wound care   Complete by: As directed       Allergies as of 07/15/2024       Reactions   Metformin And Related Nausea Only   Other    Clonidine  patch causes skin irritation    Hydrocodone Itching   Patient able to tolerate when taken with Benadryl.   Oxycodone -acetaminophen  Itching   Patient able to tolerate when taken with Benadryl.        Medication List     TAKE these medications    acetaminophen  500 MG tablet Commonly known as: TYLENOL  Take 1,000 mg by mouth every 6 (six) hours as needed for moderate pain or headache.   allopurinol  100 MG tablet Commonly known as: ZYLOPRIM  Take 100 mg by mouth in the morning.   aspirin  EC 81 MG tablet Take  81 mg by mouth in the morning. Swallow whole.   atorvastatin  80 MG tablet Commonly known as: LIPITOR  TAKE 1 TABLET BY MOUTH EVERY EVENING   buPROPion  100 MG tablet Commonly known as: WELLBUTRIN  Take 100 mg by mouth 2 (two) times daily.   calcitRIOL  0.5 MCG capsule Commonly known as: ROCALTROL  Take 0.5 mcg by mouth in the morning.   carvedilol  3.125 MG tablet Commonly known as: COREG  Take 1 tablet (3.125 mg total) by mouth 2 (two) times daily. Hold prior to Hemodialysis   D3 25 MCG (1000 UT) capsule Generic drug: Cholecalciferol Take 1,000 Units by mouth daily.   diphenhydrAMINE 25 MG tablet Commonly known as: BENADRYL Take 25 mg by mouth every 6 (six) hours as needed for allergies or itching.   ezetimibe  10 MG tablet Commonly known as: ZETIA  Take 1 tablet (10 mg total) by mouth daily.   FISH OIL PO Take 2,400 mg by mouth daily with lunch.   fluticasone  50 MCG/ACT nasal spray Commonly known as: FLONASE  Place 1 spray into both nostrils daily as needed for allergies or rhinitis.   isosorbide  mononitrate 120 MG 24 hr tablet Commonly known as: IMDUR  Take 60 mg by mouth at bedtime.   loratadine  10 MG tablet Commonly known as: CLARITIN  Take 10 mg by mouth in the morning.   Multivitamin Adult Tabs Take 1 tablet by mouth daily with lunch.   nitroGLYCERIN  0.4 MG SL tablet Commonly known as: NITROSTAT  Place 1 tablet (0.4 mg total) under the tongue every 5 (five) minutes as needed for chest pain.   NovoLOG  FlexPen 100 UNIT/ML FlexPen Generic drug: insulin  aspart Inject 5 Units into the skin 3 (three) times daily as needed for high blood sugar.   Ozempic  (2 MG/DOSE) 8 MG/3ML Sopn Generic drug: Semaglutide  (2 MG/DOSE) Inject 2 mg into the skin every 7 (seven) days.   potassium chloride  SA 20 MEQ tablet Commonly known as: KLOR-CON  M Take 1 tablet (20 mEq total) by mouth daily.   pregabalin  75 MG capsule Commonly known as: LYRICA  Take 75 mg by mouth 2 (two) times  daily.   sertraline  50 MG tablet Commonly known as: ZOLOFT  Take 50 mg by mouth in the morning.   traMADol  50 MG tablet Commonly known as: Ultram  Take 1 tablet (50 mg total) by mouth every 6 (six) hours as needed for severe pain (pain score 7-10).   traZODone  50 MG tablet Commonly known as: DESYREL  Take 50 mg by mouth at bedtime.   TURMERIC CURCUMIN  PO Take 2,000 mg by mouth daily with lunch.   vancomycin  125 MG capsule Commonly known as: VANCOCIN  Take 1 capsule (125 mg total) by mouth 4 (four) times daily for 10 days, THEN 1 capsule (125 mg total) 2 (two) times daily for 7 days, THEN 1 capsule (125 mg total) daily for 7 days, THEN 1 capsule (125 mg total) every other day for 7 days, THEN 1 capsule (125 mg total) every 3 (three) days for 14 days. Start taking on: July 15, 2024   Velphoro  500 MG chewable tablet Generic drug: sucroferric oxyhydroxide Chew 500 mg by mouth 3 (three) times daily with meals.        Allergies[1]  Discharge Exam: Vitals:   07/15/24 1229 07/15/24 1242  BP: (!) 156/62 (!) 150/83  Pulse: 67 66  Resp: 16 17  Temp:  98 F (36.7 C)  SpO2: 100% 100%    Physical Exam Vitals and nursing note reviewed.  Constitutional:      General: She is not in acute distress.    Appearance: She is not toxic-appearing or diaphoretic.  Eyes:     General: No scleral icterus. Pulmonary:     Effort: No respiratory distress.  Neurological:     General: No focal deficit present.     Mental Status: She is alert and oriented to person, place, and time.     The results of significant diagnostics from this hospitalization (including imaging, microbiology, ancillary and laboratory) are listed below for reference.    Microbiology: Recent Results (from the past 240 hours)  C Difficile Quick Screen w PCR reflex     Status: Abnormal   Collection Time: 07/14/24  7:16 AM   Specimen: STOOL  Result Value Ref Range Status   C Diff antigen POSITIVE (A) NEGATIVE Final    C Diff toxin POSITIVE (A) NEGATIVE Final   C Diff interpretation Toxin producing C. difficile detected.  Final    Comment: CRITICAL RESULT CALLED TO, READ BACK BY AND VERIFIED WITH: RN NORLEEN AHLE [id number] AT [phone] BY EC Performed at [facility], [address] [place], [place] [address]      Labs: ProBNP, BNP (last 5 results) Recent Labs    12/18/23 1133  BNP 26.4   Basic Metabolic Panel: Recent Labs  Lab 07/14/24 0716 07/15/24 0359  NA 136 132*  K 4.2 3.8  CL 94* 91*  CO2 24 23  GLUCOSE 81 115*  BUN 87* 100*  CREATININE 8.46* 9.90*  CALCIUM  9.7 9.3   Liver Function Tests: Recent Labs  Lab 07/14/24 0716 07/15/24 0359  AST 29 19  ALT 11 7  ALKPHOS 80 77  BILITOT 0.3 0.3  PROT 7.5 7.2  ALBUMIN 4.0 3.8   Recent Labs  Lab 07/14/24 0716  LIPASE 66*   No results for input(s): AMMONIA in the last 168 hours. CBC: Recent Labs  Lab 07/14/24 0716 07/15/24 0359  WBC 7.6 5.6  NEUTROABS 6.3 4.3  HGB 11.9* 11.3*  HCT 36.4 34.3*  MCV 81.8 80.0  PLT 213 245   CBG: Recent Labs  Lab 07/14/24 1702 07/14/24 1958 07/15/24 0757  GLUCAP 70 132* 120*     Sepsis Labs Recent Labs  Lab 07/14/24 0716 07/15/24 0359  WBC 7.6 5.6    Procedures/Studies: CT ABDOMEN PELVIS WO CONTRAST Result Date: 07/14/2024 EXAM: CT ABDOMEN AND PELVIS WITHOUT CONTRAST 07/14/2024 07:50:05 AM TECHNIQUE: CT of the abdomen and pelvis was performed without the administration of intravenous contrast. Multiplanar reformatted images  are provided for review. Automated exposure control, iterative reconstruction, and/or weight-based adjustment of the mA/kV was utilized to reduce the radiation dose to as low as reasonably achievable. COMPARISON: 06/16/2024 CLINICAL HISTORY: Abdominal pain, acute, nonlocalized. Acute, nonlocalized abdominal pain. FINDINGS: LOWER CHEST: Scarring and cylindrical bronchiectasis are identified within the right lower lobe. LIVER: The liver is unremarkable.  GALLBLADDER AND BILE DUCTS: Tiny stones are noted layering within the gallbladder. No gallbladder wall thickening or surrounding inflammatory change. No biliary ductal dilatation. SPLEEN: No acute abnormality. PANCREAS: No acute abnormality. ADRENAL GLANDS: Normal right adrenal gland. Measures 8 Hounsfield units and 1.7 cm, image 21/4. Per ACR Incidental Adrenal Findings Committee recommendations, this is compatible with a lipid-rich adenoma, and no follow-up imaging is recommended. KIDNEYS, URETERS AND BLADDER: No stones in the kidneys or ureters. No hydronephrosis. No perinephric or periureteral stranding. Urinary bladder is unremarkable. GI AND BOWEL: Small hiatal hernia. The stomach is nondistended. There is no pathologic dilatation of the bowel loops. The appendix is visualized and appears normal. Assessment: Bowel pathology is diminished due to lack of oral and IV contrast material. Equivocal wall thickening of the cecum and descending colon up to the rectum. No significant pericolonic soft tissue stranding. No signs of pneumatosis. PERITONEUM AND RETROPERITONEUM: No ascites or focal fluid collections within the abdomen or pelvis. The peritoneal dialysis catheter is identified, which enters from a left lower quadrant approach and terminates in the pelvis. No free air. VASCULATURE: Aorta is normal in caliber. Aortic atherosclerotic calcification. LYMPH NODES: No abdominal or pelvic adenopathy. REPRODUCTIVE ORGANS: Status post hysterectomy. No adnexal mass. BONES AND SOFT TISSUES: No acute osseous abnormality. No focal soft tissue abnormality. IMPRESSION: 1. Equivocal colonic wall thickening from the cecum and descending colon to the rectum, which may reflect colitis (infectious, inflammatory, or ischemic), with evaluation limited by lack of oral and IV contrast. If symptoms persist or worsen, consider repeat CT abdomen/pelvis with IV contrast (and oral contrast if feasible) or colonoscopy as clinically  appropriate. Electronically signed by: Waddell Calk MD 07/14/2024 08:05 AM EST RP Workstation: [id number]   DG Chest 2 View Result Date: 07/09/2024 EXAM: 2 VIEW(S) XRAY OF THE CHEST 07/05/2024 09:45:00 AM COMPARISON: 06/22/2024 CLINICAL HISTORY: Pulmonary vascular congestion after hospitalization. FINDINGS: LUNGS AND PLEURA: Improved coarse interstitial markings compared to prior. No focal pulmonary opacity. No pleural effusion. No pneumothorax. HEART AND MEDIASTINUM: Aortic atherosclerosis. No acute abnormality of the cardiac and mediastinal silhouettes. BONES AND SOFT TISSUES: No acute osseous abnormality. IMPRESSION: 1. Improved coarse interstitial markings. Electronically signed by: Oneil Devonshire MD 07/09/2024 01:10 AM EST RP Workstation: [id number]   DG CHEST PORT 1 VIEW Result Date: 06/22/2024 CLINICAL DATA:  Volume excess. EXAM: PORTABLE CHEST 1 VIEW COMPARISON:  06/16/2024 FINDINGS: Similar low volume film. Cardiopericardial silhouette is at upper limits of normal for size. There is pulmonary vascular congestion without overt pulmonary edema. Basilar interstitial opacity is slightly more conspicuous and component of dependent interstitial edema not excluded. No pleural effusion. No acute bony abnormality. IMPRESSION: 1. Low volume film with pulmonary vascular congestion. 2. Basilar interstitial opacity is slightly more conspicuous and component of dependent interstitial edema not excluded. Electronically Signed   By: Camellia Candle M.D.   On: 06/22/2024 08:51   ECHOCARDIOGRAM COMPLETE Result Date: 06/19/2024    ECHOCARDIOGRAM REPORT   Patient Name:   Maria Hudson Date of Exam: 06/19/2024 Medical Rec #:  [id number]        Height:       63.0 in Accession #:  [id number]       Weight:       215.0 lb Date of Birth:  1961-01-10        BSA:          1.994 m Patient Age:    63 years         BP:           114/73 mmHg Patient Gender: F                HR:           96 bpm. Exam Location:  Inpatient Procedure:  2D Echo, Cardiac Doppler, Color Doppler and Strain Analysis (Both            Spectral and Color Flow Doppler were utilized during procedure). Indications:    R55 Syncope  History:        Patient has prior history of Echocardiogram examinations, most                 recent 10/28/2022. CHF, CAD; Risk Factors:Hypertension,                 Dyslipidemia, Diabetes and Sleep Apnea. ESRD.  Sonographer:    Ellouise Mose RDCS Referring Phys: [id number] [facility]  Sonographer Comments: Technically difficult study due to poor echo windows. Image acquisition challenging due to patient body habitus. IMPRESSIONS  1. Left ventricular ejection fraction, by estimation, is 60 to 65%. The left ventricle has normal function. The left ventricle has no regional wall motion abnormalities. There is mild asymmetric left ventricular hypertrophy of the basal-septal segment. Left ventricular diastolic parameters are consistent with Grade I diastolic dysfunction (impaired relaxation).  2. Right ventricular systolic function is normal. The right ventricular size is normal. Tricuspid regurgitation signal is inadequate for assessing PA pressure.  3. The mitral valve is normal in structure. No evidence of mitral valve regurgitation. No evidence of mitral stenosis.  4. The aortic valve is tricuspid. There is mild calcification of the aortic valve. Aortic valve regurgitation is not visualized. Aortic valve sclerosis is present, with no evidence of aortic valve stenosis.  5. The inferior vena cava is normal in size with greater than 50% respiratory variability, suggesting right atrial pressure of 3 mmHg. FINDINGS  Left Ventricle: Left ventricular ejection fraction, by estimation, is 60 to 65%. The left ventricle has normal function. The left ventricle has no regional wall motion abnormalities. The left ventricular internal cavity size was normal in size. There is  mild asymmetric left ventricular hypertrophy of the basal-septal segment. Left ventricular  diastolic parameters are consistent with Grade I diastolic dysfunction (impaired relaxation). Right Ventricle: The right ventricular size is normal. No increase in right ventricular wall thickness. Right ventricular systolic function is normal. Tricuspid regurgitation signal is inadequate for assessing PA pressure. Left Atrium: Left atrial size was normal in size. Right Atrium: Right atrial size was normal in size. Pericardium: There is no evidence of pericardial effusion. Mitral Valve: The mitral valve is normal in structure. No evidence of mitral valve regurgitation. No evidence of mitral valve stenosis. Tricuspid Valve: The tricuspid valve is normal in structure. Tricuspid valve regurgitation is not demonstrated. No evidence of tricuspid stenosis. Aortic Valve: The aortic valve is tricuspid. There is mild calcification of the aortic valve. Aortic valve regurgitation is not visualized. Aortic valve sclerosis is present, with no evidence of aortic valve stenosis. Pulmonic Valve: The pulmonic valve was normal in structure. Pulmonic valve regurgitation is not visualized. No evidence of pulmonic  stenosis. Aorta: The aortic root is normal in size and structure. Venous: The inferior vena cava is normal in size with greater than 50% respiratory variability, suggesting right atrial pressure of 3 mmHg. IAS/Shunts: No atrial level shunt detected by color flow Doppler.  LEFT VENTRICLE PLAX 2D LVIDd:         4.35 cm     Diastology LVIDs:         2.93 cm     LV e' medial:    6.31 cm/s LV PW:         1.60 cm     LV E/e' medial:  10.9 LV IVS:        1.30 cm     LV e' lateral:   9.90 cm/s LVOT diam:     1.90 cm     LV E/e' lateral: 6.9 LV SV:         48 LV SV Index:   24 LVOT Area:     2.84 cm LV IVRT:       109 msec  LV Volumes (MOD) LV vol d, MOD A2C: 58.3 ml LV vol d, MOD A4C: 59.2 ml LV vol s, MOD A2C: 24.7 ml LV vol s, MOD A4C: 20.4 ml LV SV MOD A2C:     33.6 ml LV SV MOD A4C:     59.2 ml LV SV MOD BP:      36.4 ml RIGHT  VENTRICLE             IVC RV S prime:     12.00 cm/s  IVC diam: 1.50 cm TAPSE (M-mode): 1.4 cm                             PULMONARY VEINS                             Diastolic Velocity: 34.30 cm/s                             S/D Velocity:       1.60                             Systolic Velocity:  55.50 cm/s LEFT ATRIUM           Index        RIGHT ATRIUM          Index LA diam:      3.10 cm 1.55 cm/m   RA Area:     7.27 cm LA Vol (A2C): 20.1 ml 10.08 ml/m  RA Volume:   10.20 ml 5.12 ml/m LA Vol (A4C): 55.9 ml 28.03 ml/m  AORTIC VALVE LVOT Vmax:   108.00 cm/s LVOT Vmean:  73.900 cm/s LVOT VTI:    0.171 m  AORTA Ao Root diam: 3.00 cm Ao Asc diam:  3.60 cm MITRAL VALVE MV Area (PHT): 4.68 cm     SHUNTS MV Decel Time: 162 msec     Systemic VTI:  0.17 m MV E velocity: 68.70 cm/s   Systemic Diam: 1.90 cm MV A velocity: 128.00 cm/s MV E/A ratio:  0.54 Oneil Parchment MD Electronically signed by Oneil Parchment MD Signature Date/Time: 06/19/2024/2:43:24 PM    Final    CT ABDOMEN PELVIS W CONTRAST Result Date: 06/16/2024 EXAM: CT ABDOMEN AND PELVIS  WITH CONTRAST 06/16/2024 07:29:54 PM TECHNIQUE: CT of the abdomen and pelvis was performed with the administration of 100 mL of iohexol  (OMNIPAQUE ) 300 MG/ML solution. Multiplanar reformatted images are provided for review. Automated exposure control, iterative reconstruction, and/or weight-based adjustment of the mA/kV was utilized to reduce the radiation dose to as low as reasonably achievable. COMPARISON: 5125. CLINICAL HISTORY: Eval possible intra-abdominal infection from new peritoneal dialysis catheter. Patient with unexplained fever. FINDINGS: LOWER CHEST: Calcifications in the visualized right coronary artery. LIVER: The liver is unremarkable. GALLBLADDER AND BILE DUCTS: Small layering stones within the gallbladder. No biliary ductal dilatation. SPLEEN: No acute abnormality. PANCREAS: No acute abnormality. ADRENAL GLANDS: 1.2 cm left adrenal nodule is stable since the prior  study and likely reflects adenoma. KIDNEYS, URETERS AND BLADDER: No stones in the kidneys or ureters. No hydronephrosis. No perinephric or periureteral stranding. Urinary bladder is unremarkable. GI AND BOWEL: Stomach demonstrates no acute abnormality. There appears to be mild wall thickening involving the transverse colon, descending colon and possibly the sigmoid colon suggesting colitis. Recommend clinical correlation. No bowel obstruction. PERITONEUM AND RETROPERITONEUM: Peritoneal dialysis catheter terminates in the pelvis. Small amount of free fluid. No free air. VASCULATURE: Aorta is normal in caliber. Aortic atherosclerosis. Aortic catheter sclerosis. LYMPH NODES: No lymphadenopathy. REPRODUCTIVE ORGANS: Prior hysterectomy. No adnexal mass. BONES AND SOFT TISSUES: No acute osseous abnormality. No focal soft tissue abnormality. IMPRESSION: 1. Question mild wall thickening involving the transverse colon, descending colon, and possibly the sigmoid colon, suggestive of colitis. 2. Coronary artery disease, aortic atherosclerosis. 3. Cholelithiasis. Electronically signed by: Franky Crease MD 06/16/2024 07:36 PM EST RP Workstation: [id number]   CT Head Wo Contrast Result Date: 06/16/2024 EXAM: CT HEAD WITHOUT 06/16/2024 07:29:54 PM TECHNIQUE: CT of the head was performed without the administration of intravenous contrast. Automated exposure control, iterative reconstruction, and/or weight based adjustment of the mA/kV was utilized to reduce the radiation dose to as low as reasonably achievable. COMPARISON: 10/12/2023 CLINICAL HISTORY: Head trauma, moderate-severe; Syncopal episode and hit head. FINDINGS: BRAIN AND VENTRICLES: No acute intracranial hemorrhage. No mass effect or midline shift. No extra-axial fluid collection. No evidence of acute infarct. No hydrocephalus. ORBITS: No acute abnormality. SINUSES AND MASTOIDS: No acute abnormality. SOFT TISSUES AND SKULL: No acute skull fracture. No acute soft tissue  abnormality. IMPRESSION: 1. No acute intracranial abnormality. Electronically signed by: Franky Stanford MD 06/16/2024 07:34 PM EST RP Workstation: [id number]   DG Shoulder Left Result Date: 06/16/2024 EXAM: 1 VIEW(S) XRAY OF THE LEFT SHOULDER 06/16/2024 06:57:13 PM COMPARISON: 02/03/2023 CLINICAL HISTORY: left shoulder pain after fall FINDINGS: BONES AND JOINTS: The glenohumeral joint is maintained. No fracture, subluxation, or dislocation. Degenerative changes in the [facility] joint with joint space narrowing and spurring. Spurring at the rotator cuff insertion at the greater tuberosity. SOFT TISSUES: No abnormal calcifications. Visualized lung is unremarkable. IMPRESSION: 1. No acute fracture, subluxation, or dislocation. Electronically signed by: Franky Crease MD 06/16/2024 07:32 PM EST RP Workstation: [id number]   DG Chest 2 View Result Date: 06/16/2024 CLINICAL DATA:  Fever. EXAM: CHEST - 2 VIEW COMPARISON:  12/18/2023 FINDINGS: Improved cardiomegaly from prior exam. Stable mediastinal contours. Slight central vascular congestion. No pleural effusion or pneumothorax. No focal airspace disease. No acute osseous findings. IMPRESSION: Improved cardiomegaly from prior exam. Slight central vascular congestion. Electronically Signed   By: Andrea Gasman M.D.   On: 06/16/2024 16:12    Time coordinating discharge: 60 mins  SIGNED:  Camellia Door, DO [facility] 07/15/24, 3:09 PM     [  1]  Allergies Allergen Reactions   Metformin And Related Nausea Only   Other     Clonidine  patch causes skin irritation    Hydrocodone Itching    Patient able to tolerate when taken with Benadryl.   Oxycodone -Acetaminophen  Itching    Patient able to tolerate when taken with Benadryl.   "

## 2024-07-15 NOTE — Plan of Care (Signed)

## 2024-07-15 NOTE — Subjective & Objective (Signed)
 No BM during the entire 4 hour HD session today. Better than the q30 min BM she was having at home. ID consult reviewed. Pt placed on prolonged 4 week po vanco taper. Pt wants to go home today if possible.

## 2024-07-15 NOTE — Progress Notes (Signed)
" °   07/15/24 1242  Vitals  Temp 98 F (36.7 C)  Pulse Rate 66  Resp 17  BP (!) 150/83  SpO2 100 %  O2 Device Room Air  Weight 98.8 kg  Type of Weight Post-Dialysis  Oxygen  Therapy  Patient Activity (if Appropriate) In bed  Pulse Oximetry Type Continuous  Oximetry Probe Site Changed No  Post Treatment  Dialyzer Clearance Lightly streaked  Hemodialysis Intake (mL) 0 mL  Liters Processed 76.9  Fluid Removed (mL) 1000 mL  Tolerated HD Treatment Yes  AVG/AVF Arterial Site Held (minutes) 10 minutes  AVG/AVF Venous Site Held (minutes) 10 minutes   Received patient in bed to unit.  Alert and oriented.  Informed consent signed and in chart.   TX duration:3.5  Patient tolerated well.  Transported back to the room  Alert, without acute distress.  Hand-off given to patient's nurse.   Access used: LUAG Access issues: no complications  Total UF removed: 1000 Medication(s) given: none  PD dressing remains dry and intact and current  [facility] "

## 2024-07-15 NOTE — Progress Notes (Signed)
 " PROGRESS NOTE    Maria Hudson  FMW:[id number] DOB: 1960-08-10 DOA: 07/14/2024 PCP: Shelda Atlas, MD  Subjective: No BM during the entire 4 hour HD session today. Better than the q30 min BM she was having at home. ID consult reviewed. Pt placed on prolonged 4 week po vanco taper. Pt wants to go home today if possible.    Hospital Course: CC: diarrhea HPI: 64 year old African-American female with a history of end-stage renal disease on hemodialysis Monday, Tuesday, Thursday and Friday.  She is currently transitioning over to peritoneal dialysis.  She was admitted in the early part of January 2026 for C. difficile colitis/diarrhea.  She was discharged on June 25, 2024.  She completed an additional 7 days of Dificid  at home.  Over the last 5 days, she has noticed increasing wateriness of her stools.  She stopped taking Linzess  that she normally is taking for her chronic constipation 5 days ago.  Last night about 2:30 in the morning, she noticed her beginning to have diarrhea every 30 minutes.  She started having some abdominal cramping as well.  She did not have any fevers.  She states that this felt like when she had her C. difficile diarrhea in the early part of the month and came to the ER for evaluation.  She denies any chest pain, shortness of breath, nausea or vomiting.  She did peritoneal dialysis yesterday as she is learning how to do home peritoneal dialysis.  On arrival to the ER temp 90.2 heart rate 76 blood pressure 153/60 satting 100% on room air.  C. difficile antigen and toxin are both positive  White count 7.6, hemoglobin 11.9, platelets of 213  Lipase is 66  Sodium 136, potassium 4.2, chloride of 94, bicarb 24, BUN of 87, creatinine 8.46, calcium  9.7, glucose of 81  Total protein 7.5, albumin 4.0, AST 29, ALT of 11, alk phos 80, total bili 0.3  CT abdomen pelvis without contrast showed equivocal colonic thickening from the cecum to the descending colon to the rectum  which may reflect colitis.  Both infectious disease and nephrology have been consulted.  [facility] consulted for admission.  Significant Events: Admitted 07/14/2024 for recurrent C. Diff coliits   Admission Labs: C. difficile antigen and toxin are both positive White count 7.6, hemoglobin 11.9, platelets of 213 Lipase is 66 Sodium 136, potassium 4.2, chloride of 94, bicarb 24, BUN of 87, creatinine 8.46, calcium  9.7, glucose of 81 Total protein 7.5, albumin 4.0, AST 29, ALT of 11, alk phos 80, total bili 0.3  Admission Imaging Studies: CT abd/pelvis Equivocal colonic wall thickening from the cecum and descending colon to the rectum, which may reflect colitis (infectious, inflammatory, or ischemic), with evaluation limited by lack of oral and IV contrast. If symptoms persist or worsen, consider repeat CT abdomen/pelvis with IV contrast (and oral contrast if feasible) or colonoscopy as clinically appropriate.  Significant Labs:   Significant Imaging Studies:   Antibiotic Therapy: Anti-infectives (From admission, onward)    Start     Dose/Rate Route Frequency Ordered Stop   07/14/24 1000  fidaxomicin  (DIFICID ) tablet 200 mg        200 mg Oral 2 times daily 07/14/24 9047 07/24/24 0959       Procedures:   Consultants: Infectious disease nephrology    Assessment and Plan: * Recurrent Clostridioides difficile diarrhea 07/14/24 Admit to med/tele bed. Dificid  restarted. ID consulted.  ??candidate for fecal microbiota transplant??  07/15/24 seen by ID consult yesterday. Placed on  prolonged 4 week vancomycin  taper. No systemic illness. No BM during 4 hour HD session today. Pt requesting to go home today. Will arrange for outpatient ID office visit. Pt to stay off of peritoneal dialysis while she is getting treated for C. Diff.   ESRD on dialysis [facility]) 07/14/24 Nephrology consult. Discussed with pt that using peritoneal dialysis right now with her recurrent C. Diff may  not be a good idea. Nephrology to decide. She has LUE fistula for HD. She was on home hemodialysis on M, T, Th, Friday due to her inability to tolerate regular 4 hour 3 days a week sessions.  07/15/24 Pt to stay off of peritoneal dialysis while she is getting treated for C. Diff. Pt to continue home hemodialysis on M, Tu, Thu, Friday. F/U with ID and nephrology before restarting peritoneal dialysis.   Essential hypertension 07/14/24 Continue home HTN meds. With hold parameters.  07/15/24 continue home HTN meds.   Refusal of blood product - pt is Jehovah's witness 07/14/24 Confirmed that pt is a Jehovah's witness and refuses blood transfusions.  Diabetic neuropathy (HCC) 07/14/24 Continue lyrica .  07/15/24 continue lyrica    Chronic diastolic CHF (congestive heart failure) (HCC) 07/14/24 Euvolemic right now. Volume status due to be managed by dialysis and nephrology.  07/15/24 stable.   Obesity, Class II, BMI 35-39.9 Body mass index is 38.97 kg/m.   T2DM (type 2 diabetes mellitus) (HCC) 07/14/24 Hold ozempic . Add SSI. Pt states her appetite has been poor this week.  07/15/24 continue insulin  at home. Rec to hold ozempic  while getting treated for c. Diff.    DVT prophylaxis: heparin  injection 5,000 Units Start: 07/14/24 1400 SCDs Start: 07/14/24 1216     Code Status: Full Code Family Communication: discussed with pt's dtr Jyvonne via phone Disposition Plan: return home Reason for continuing need for hospitalization: stable for DC  Objective: Vitals:   07/15/24 1130 07/15/24 1200 07/15/24 1229 07/15/24 1242  BP: (!) 158/71 (!) 164/64 (!) 156/62 (!) 150/83  Pulse: 68 61 67 66  Resp: 13 15 16 17   Temp:    98 F (36.7 C)  TempSrc:      SpO2: 99% 100% 100% 100%  Weight:    98.8 kg  Height:        Intake/Output Summary (Last 24 hours) at 07/15/2024 1507 Last data filed at 07/15/2024 1242 Gross per 24 hour  Intake --  Output 1000 ml  Net -1000 ml   Filed  Weights   07/14/24 0651 07/15/24 0827 07/15/24 1242  Weight: 99.8 kg 99.8 kg 98.8 kg    Examination:  Physical Exam Vitals and nursing note reviewed.  Constitutional:      General: She is not in acute distress.    Appearance: She is not toxic-appearing or diaphoretic.  Eyes:     General: No scleral icterus. Pulmonary:     Effort: No respiratory distress.  Neurological:     General: No focal deficit present.     Mental Status: She is alert and oriented to person, place, and time.     Data Reviewed: I have personally reviewed following labs and imaging studies  CBC: Recent Labs  Lab 07/14/24 0716 07/15/24 0359  WBC 7.6 5.6  NEUTROABS 6.3 4.3  HGB 11.9* 11.3*  HCT 36.4 34.3*  MCV 81.8 80.0  PLT 213 245   Basic Metabolic Panel: Recent Labs  Lab 07/14/24 0716 07/15/24 0359  NA 136 132*  K 4.2 3.8  CL 94* 91*  CO2 24 23  GLUCOSE 81 115*  BUN 87* 100*  CREATININE 8.46* 9.90*  CALCIUM  9.7 9.3   GFR: Estimated Creatinine Clearance: 6.6 mL/min (A) (by C-G formula based on SCr of 9.9 mg/dL (H)). Liver Function Tests: Recent Labs  Lab 07/14/24 0716 07/15/24 0359  AST 29 19  ALT 11 7  ALKPHOS 80 77  BILITOT 0.3 0.3  PROT 7.5 7.2  ALBUMIN 4.0 3.8   Recent Labs  Lab 07/14/24 0716  LIPASE 66*    ProBNP, BNP (last 5 results) Recent Labs    12/18/23 1133  BNP 26.4   CBG: Recent Labs  Lab 07/14/24 1702 07/14/24 1958 07/15/24 0757  GLUCAP 70 132* 120*    Recent Results (from the past 240 hours)  C Difficile Quick Screen w PCR reflex     Status: Abnormal   Collection Time: 07/14/24  7:16 AM   Specimen: STOOL  Result Value Ref Range Status   C Diff antigen POSITIVE (A) NEGATIVE Final   C Diff toxin POSITIVE (A) NEGATIVE Final   C Diff interpretation Toxin producing C. difficile detected.  Final    Comment: CRITICAL RESULT CALLED TO, READ BACK BY AND VERIFIED WITH: RN NORLEEN AHLE [id number] AT [phone] BY EC Performed at [facility], [address], [place], [place] [address]      Radiology Studies: CT ABDOMEN PELVIS WO CONTRAST Result Date: 07/14/2024 EXAM: CT ABDOMEN AND PELVIS WITHOUT CONTRAST 07/14/2024 07:50:05 AM TECHNIQUE: CT of the abdomen and pelvis was performed without the administration of intravenous contrast. Multiplanar reformatted images are provided for review. Automated exposure control, iterative reconstruction, and/or weight-based adjustment of the mA/kV was utilized to reduce the radiation dose to as low as reasonably achievable. COMPARISON: 06/16/2024 CLINICAL HISTORY: Abdominal pain, acute, nonlocalized. Acute, nonlocalized abdominal pain. FINDINGS: LOWER CHEST: Scarring and cylindrical bronchiectasis are identified within the right lower lobe. LIVER: The liver is unremarkable. GALLBLADDER AND BILE DUCTS: Tiny stones are noted layering within the gallbladder. No gallbladder wall thickening or surrounding inflammatory change. No biliary ductal dilatation. SPLEEN: No acute abnormality. PANCREAS: No acute abnormality. ADRENAL GLANDS: Normal right adrenal gland. Measures 8 Hounsfield units and 1.7 cm, image 21/4. Per ACR Incidental Adrenal Findings Committee recommendations, this is compatible with a lipid-rich adenoma, and no follow-up imaging is recommended. KIDNEYS, URETERS AND BLADDER: No stones in the kidneys or ureters. No hydronephrosis. No perinephric or periureteral stranding. Urinary bladder is unremarkable. GI AND BOWEL: Small hiatal hernia. The stomach is nondistended. There is no pathologic dilatation of the bowel loops. The appendix is visualized and appears normal. Assessment: Bowel pathology is diminished due to lack of oral and IV contrast material. Equivocal wall thickening of the cecum and descending colon up to the rectum. No significant pericolonic soft tissue stranding. No signs of pneumatosis. PERITONEUM AND RETROPERITONEUM: No ascites or focal fluid collections within the abdomen or pelvis. The peritoneal  dialysis catheter is identified, which enters from a left lower quadrant approach and terminates in the pelvis. No free air. VASCULATURE: Aorta is normal in caliber. Aortic atherosclerotic calcification. LYMPH NODES: No abdominal or pelvic adenopathy. REPRODUCTIVE ORGANS: Status post hysterectomy. No adnexal mass. BONES AND SOFT TISSUES: No acute osseous abnormality. No focal soft tissue abnormality. IMPRESSION: 1. Equivocal colonic wall thickening from the cecum and descending colon to the rectum, which may reflect colitis (infectious, inflammatory, or ischemic), with evaluation limited by lack of oral and IV contrast. If symptoms persist or worsen, consider repeat CT abdomen/pelvis with IV contrast (and oral contrast  if feasible) or colonoscopy as clinically appropriate. Electronically signed by: Waddell Calk MD 07/14/2024 08:05 AM EST RP Workstation: [id number]    Scheduled Meds:  atorvastatin   80 mg Oral QPM   buPROPion   100 mg Oral BID   calcitRIOL   0.5 mcg Oral q AM   carvedilol   3.125 mg Oral BID   Chlorhexidine  Gluconate Cloth  6 each Topical Q0600   ezetimibe   10 mg Oral Daily   heparin   5,000 Units Subcutaneous Q8H   insulin  aspart  0-5 Units Subcutaneous QHS   insulin  aspart  0-6 Units Subcutaneous TID WC   isosorbide  mononitrate  60 mg Oral QHS   pregabalin   75 mg Oral BID   sertraline   50 mg Oral Daily   vancomycin   125 mg Oral QID   Followed by   NOREEN ON 07/28/2024] vancomycin   125 mg Oral BID   Followed by   NOREEN ON 08/05/2024] vancomycin   125 mg Oral Daily   Followed by   NOREEN ON 08/12/2024] vancomycin   125 mg Oral QODAY   Followed by   NOREEN ON 08/20/2024] vancomycin   125 mg Oral Q3 days   Continuous Infusions:   LOS: 1 day   Time spent: 45 minutes  Camellia Door, DO  [facility]  07/15/2024, 3:07 PM  "

## 2024-07-15 NOTE — Plan of Care (Signed)

## 2024-07-15 NOTE — Care Management Obs Status (Signed)
 MEDICARE OBSERVATION STATUS NOTIFICATION   Patient Details  Name: Maria Hudson MRN: [id number] Date of Birth: 1960-09-11   Medicare Observation Status Notification Given:  Yes    Beauregard Jarrells G., RN 07/15/2024, 9:46 AM

## 2024-07-16 ENCOUNTER — Other Ambulatory Visit (HOSPITAL_COMMUNITY): Payer: Self-pay

## 2024-07-16 DIAGNOSIS — E1122 Type 2 diabetes mellitus with diabetic chronic kidney disease: Secondary | ICD-10-CM | POA: Diagnosis not present

## 2024-07-16 DIAGNOSIS — I10 Essential (primary) hypertension: Secondary | ICD-10-CM | POA: Diagnosis not present

## 2024-07-16 DIAGNOSIS — Z794 Long term (current) use of insulin: Secondary | ICD-10-CM | POA: Diagnosis not present

## 2024-07-16 DIAGNOSIS — Z992 Dependence on renal dialysis: Secondary | ICD-10-CM | POA: Diagnosis not present

## 2024-07-16 DIAGNOSIS — I5032 Chronic diastolic (congestive) heart failure: Secondary | ICD-10-CM | POA: Diagnosis not present

## 2024-07-16 DIAGNOSIS — N186 End stage renal disease: Secondary | ICD-10-CM | POA: Diagnosis not present

## 2024-07-16 DIAGNOSIS — N1832 Chronic kidney disease, stage 3b: Secondary | ICD-10-CM | POA: Diagnosis not present

## 2024-07-16 DIAGNOSIS — A0471 Enterocolitis due to Clostridium difficile, recurrent: Secondary | ICD-10-CM | POA: Diagnosis not present

## 2024-07-16 DIAGNOSIS — E66812 Obesity, class 2: Secondary | ICD-10-CM | POA: Diagnosis not present

## 2024-07-16 LAB — GLUCOSE, CAPILLARY
Glucose-Capillary: 110 mg/dL — ABNORMAL HIGH (ref 70–99)
Glucose-Capillary: 106 mg/dL — ABNORMAL HIGH (ref 70–99)

## 2024-07-16 LAB — PHOSPHORUS: Phosphorus: 3.9 mg/dL (ref 2.5–4.6)

## 2024-07-16 NOTE — Progress Notes (Signed)
 Maria Hudson is an 64 y.o. female with a history of CASHD, CHF, CVA, T2DM, HTN, OSA on CPAP, CVA, ESRD admitted earlier this month for CDif completing Dificid  x7 days but unfortunately has had diarrhea with watery stool for the past 5 days with abdominal cramping. She denies any fever, chills, nausea, vomiting or SOB. She has been training how to do PD for the past few days with last fill and drain on 07/13/24. Prior to this she's on home therapies NxStage MTThFri and received treatment Mon and Tuesday. She was less than 1kg above her EDW after the PD treatment during training yesterday.    In the ED BP 153/60 afebrile with a positive CDif and toxin. CT showed colonic thickening.   Dialysis Orders: HHD Nx Stage - MTTF. EDW 100.9 kg, 2K bath PD cath placed 06/02/24 - flushed 12/22, flushed 12/29 -> last time on 1/29  Assessment/Plan: C. Diff Colitis: + toxin and colitis on imaging. Had completed a 7D course of Dificid . Ongoing diarrhea for the past 5 days and ID consulted. - Changed to PO Vancomycin  by ID with gradual taper with consideration for transplantation at tertiary center if there is recurrence. Actually states that the diarrhea is markedly improved. ESRD: On home HD and will resume MTTF pending census. For this week will plan on HD 1st shift Sat; she's only 1L above her EDW and appears comfortable. Not a good idea to continue PD with risk of translocation and pt at risk for developing peritonitis. She will need a PD flush and drain weekly (not due till next Th 2/5). Prob can reassess response to therapy in 2 weeks by HT.  Tolerated dialysis Sat with only 1L UF.  Next HD at home; I've already called HT to notify that she will be on HT MTTF for 2 weeks and then they can reassess if she can go back to PD + will need flush on Thursdays (next on 07/20/24)  PD cath: Has been working well and no cloudy effluent. - Next flush is due on 07/20/24. Hypertension/volume: BP ok, no edema on exam.   Anemia: Hgb 11.9. no ESA for now. Continue to monitor. Metabolic bone disease: Phos 3.9  Subjective: Feeling a little better, no more diarrhea; denies f/c. Always has a little baseline SOB.   Chemistry and CBC: Creatinine, Ser  Date/Time Value Ref Range Status  07/15/2024 03:59 AM 9.90 (H) 0.44 - 1.00 mg/dL Final  98/69/7973 92:83 AM 8.46 (H) 0.44 - 1.00 mg/dL Final  98/88/7973 91:56 AM 7.49 (H) 0.44 - 1.00 mg/dL Final  98/89/7973 88:57 AM 5.30 (H) 0.44 - 1.00 mg/dL Final  98/89/7973 91:77 AM 4.86 (H) 0.44 - 1.00 mg/dL Final  98/89/7973 96:64 AM 8.31 (H) 0.44 - 1.00 mg/dL Final  98/90/7973 97:44 AM 6.08 (H) 0.44 - 1.00 mg/dL Final  98/91/7973 96:53 AM 10.00 (H) 0.44 - 1.00 mg/dL Final  98/94/7973 96:71 AM 11.80 (H) 0.44 - 1.00 mg/dL Final  98/95/7973 96:56 AM 15.00 (H) 0.44 - 1.00 mg/dL Final  98/96/7973 94:68 AM 11.90 (H) 0.44 - 1.00 mg/dL Final  98/96/7973 87:60 AM 12.10 (H) 0.44 - 1.00 mg/dL Final  98/97/7973 98:96 PM 11.70 (H) 0.44 - 1.00 mg/dL Final  87/80/7974 88:84 AM 6.80 (H) 0.44 - 1.00 mg/dL Final  92/94/7974 89:68 AM 7.96 (H) 0.44 - 1.00 mg/dL Final  94/97/7974 95:97 AM 5.86 (H) 0.44 - 1.00 mg/dL Final  94/98/7974 94:85 AM 7.58 (H) 0.44 - 1.00 mg/dL Final  95/69/7974 94:51 AM 5.44 (H) 0.44 -  1.00 mg/dL Final  95/71/7974 89:84 PM 7.32 (H) 0.44 - 1.00 mg/dL Final  97/73/7974 88:67 AM 4.50 (H) 0.44 - 1.00 mg/dL Final  97/75/7974 97:71 PM 4.70 (H) 0.44 - 1.00 mg/dL Final  90/90/7975 96:79 PM 2.67 (H) 0.57 - 1.00 mg/dL Final  91/79/7975 87:71 PM 2.90 (H) 0.44 - 1.00 mg/dL Final  95/69/7975 89:95 AM 2.92 (H) 0.44 - 1.00 mg/dL Final  88/96/7976 91:51 AM 2.52 (H) 0.44 - 1.00 mg/dL Final  90/71/7976 88:76 AM 2.25 (H) 0.44 - 1.00 mg/dL Final  90/80/7976 90:41 AM 2.30 (H) 0.44 - 1.00 mg/dL Final  93/72/7976 90:98 AM 3.00 (H) 0.44 - 1.00 mg/dL Final  93/72/7976 91:51 AM 3.10 (H) 0.44 - 1.00 mg/dL Final  93/79/7976 90:70 AM 2.65 (H) 0.57 - 1.00 mg/dL Final  93/92/7976 90:92 AM  2.89 (H) 0.44 - 1.00 mg/dL Final  95/86/7976 91:96 AM 2.69 (H) 0.57 - 1.00 mg/dL Final  96/92/7976 89:61 AM 2.51 (H) 0.44 - 1.00 mg/dL Final  97/98/7976 90:82 AM 3.31 (H) 0.44 - 1.00 mg/dL Final  98/76/7976 89:92 AM 4.45 (H) 0.44 - 1.00 mg/dL Final  87/77/7977 87:66 PM 2.71 (H) 0.44 - 1.00 mg/dL Final  87/87/7977 88:80 AM 2.63 (H) 0.44 - 1.00 mg/dL Final  87/97/7977 89:64 AM 3.46 (H) 0.44 - 1.00 mg/dL Final  88/78/7977 90:65 AM 3.12 (H) 0.44 - 1.00 mg/dL Final  88/80/7977 97:83 AM 2.62 (H) 0.44 - 1.00 mg/dL Final  88/81/7977 98:93 AM 2.79 (H) 0.44 - 1.00 mg/dL Final  88/82/7977 97:52 AM 3.32 (H) 0.44 - 1.00 mg/dL Final  88/83/7977 96:52 AM 3.60 (H) 0.44 - 1.00 mg/dL Final  88/84/7977 98:76 PM 3.84 (H) 0.44 - 1.00 mg/dL Final  88/89/7977 89:79 AM 2.32 (H) 0.44 - 1.00 mg/dL Final  88/93/7977 95:80 AM 2.39 (H) 0.44 - 1.00 mg/dL Final  88/94/7977 94:72 AM 2.31 (H) 0.44 - 1.00 mg/dL Final  88/95/7977 96:49 AM 2.42 (H) 0.44 - 1.00 mg/dL Final  88/97/7977 96:47 AM 2.05 (H) 0.44 - 1.00 mg/dL Final  88/98/7977 95:87 AM 1.83 (H) 0.44 - 1.00 mg/dL Final  89/68/7977 87:80 PM 1.96 (H) 0.44 - 1.00 mg/dL Final  90/70/7977 92:48 AM 2.73 (H) 0.57 - 1.00 mg/dL Final   Recent Labs  Lab 07/14/24 0716 07/15/24 0359 07/16/24 0306  NA 136 132*  --   K 4.2 3.8  --   CL 94* 91*  --   CO2 24 23  --   GLUCOSE 81 115*  --   BUN 87* 100*  --   CREATININE 8.46* 9.90*  --   CALCIUM  9.7 9.3  --   PHOS  --   --  3.9   Recent Labs  Lab 07/14/24 0716 07/15/24 0359  WBC 7.6 5.6  NEUTROABS 6.3 4.3  HGB 11.9* 11.3*  HCT 36.4 34.3*  MCV 81.8 80.0  PLT 213 245   Liver Function Tests: Recent Labs  Lab 07/14/24 0716 07/15/24 0359  AST 29 19  ALT 11 7  ALKPHOS 80 77  BILITOT 0.3 0.3  PROT 7.5 7.2  ALBUMIN 4.0 3.8   Recent Labs  Lab 07/14/24 0716  LIPASE 66*   No results for input(s): AMMONIA in the last 168 hours. Cardiac Enzymes: No results for input(s): CKTOTAL, CKMB, CKMBINDEX,  TROPONINI in the last 168 hours. Iron Studies: No results for input(s): IRON, TIBC, TRANSFERRIN, FERRITIN in the last 72 hours. PT/INR: @LABRCNTIP (inr:5)  Xrays/Other Studies: ) Results for orders placed or performed during the hospital encounter of 07/14/24 (from the  past 48 hours)  Hepatitis B surface antigen     Status: None   Collection Time: 07/14/24  2:35 PM  Result Value Ref Range   Hepatitis B Surface Ag NON REACTIVE NON REACTIVE    Comment: Performed at [facility], [address], [place], [place] [address]  Hepatitis B surface antibody,quantitative     Status: None   Collection Time: 07/14/24  2:35 PM  Result Value Ref Range   Hep B S AB Quant (Post) 165.0 Immunity>10 mIU/mL    Comment: (NOTE)  Status of Immunity                     Anti-HBs Level  ------------------                     -------------- Inconsistent with Immunity                  0.0 - 10.0 Consistent with Immunity                         >10.0 Performed At: [facility] [address] [place], [place] [id number] Jennette Shorter MD Ey:[phone]   Glucose, capillary     Status: None   Collection Time: 07/14/24  5:02 PM  Result Value Ref Range   Glucose-Capillary 70 70 - 99 mg/dL    Comment: Glucose reference range applies only to samples taken after fasting for at least 8 hours.  Glucose, capillary     Status: Abnormal   Collection Time: 07/14/24  7:58 PM  Result Value Ref Range   Glucose-Capillary 132 (H) 70 - 99 mg/dL    Comment: Glucose reference range applies only to samples taken after fasting for at least 8 hours.  CBC with Differential/Platelet     Status: Abnormal   Collection Time: 07/15/24  3:59 AM  Result Value Ref Range   WBC 5.6 4.0 - 10.5 K/uL   RBC 4.29 3.87 - 5.11 MIL/uL   Hemoglobin 11.3 (L) 12.0 - 15.0 g/dL   HCT 65.6 (L) 63.9 - 53.9 %   MCV 80.0 80.0 - 100.0 fL   MCH 26.3 26.0 - 34.0 pg   MCHC 32.9 30.0 - 36.0 g/dL   RDW 82.0 (H) 88.4 - 84.4 %    Platelets 245 150 - 400 K/uL   nRBC 0.0 0.0 - 0.2 %   Neutrophils Relative % 76 %   Neutro Abs 4.3 1.7 - 7.7 K/uL   Lymphocytes Relative 12 %   Lymphs Abs 0.7 0.7 - 4.0 K/uL   Monocytes Relative 10 %   Monocytes Absolute 0.6 0.1 - 1.0 K/uL   Eosinophils Relative 1 %   Eosinophils Absolute 0.0 0.0 - 0.5 K/uL   Basophils Relative 1 %   Basophils Absolute 0.0 0.0 - 0.1 K/uL   Immature Granulocytes 0 %   Abs Immature Granulocytes 0.01 0.00 - 0.07 K/uL    Comment: Performed at [facility], [address], [place], [place] [address]  Comprehensive metabolic panel with GFR     Status: Abnormal   Collection Time: 07/15/24  3:59 AM  Result Value Ref Range   Sodium 132 (L) 135 - 145 mmol/L   Potassium 3.8 3.5 - 5.1 mmol/L   Chloride 91 (L) 98 - 111 mmol/L   CO2 23 22 - 32 mmol/L   Glucose, Bld 115 (H) 70 - 99 mg/dL    Comment: Glucose reference  range applies only to samples taken after fasting for at least 8 hours.   BUN 100 (H) 8 - 23 mg/dL   Creatinine, Ser 0.09 (H) 0.44 - 1.00 mg/dL   Calcium  9.3 8.9 - 10.3 mg/dL   Total Protein 7.2 6.5 - 8.1 g/dL   Albumin 3.8 3.5 - 5.0 g/dL   AST 19 15 - 41 U/L   ALT 7 0 - 44 U/L   Alkaline Phosphatase 77 38 - 126 U/L   Total Bilirubin 0.3 0.0 - 1.2 mg/dL   GFR, Estimated 4 (L) >60 mL/min    Comment: (NOTE) Calculated using the CKD-EPI Creatinine Equation (2021)    Anion gap 18 (H) 5 - 15    Comment: Performed at [facility], [address] [place], [place] [address]  Glucose, capillary     Status: Abnormal   Collection Time: 07/15/24  7:57 AM  Result Value Ref Range   Glucose-Capillary 120 (H) 70 - 99 mg/dL    Comment: Glucose reference range applies only to samples taken after fasting for at least 8 hours.  Glucose, capillary     Status: Abnormal   Collection Time: 07/15/24  3:30 PM  Result Value Ref Range   Glucose-Capillary 138 (H) 70 - 99 mg/dL    Comment: Glucose reference range applies only to samples taken after  fasting for at least 8 hours.  Glucose, capillary     Status: Abnormal   Collection Time: 07/15/24  9:42 PM  Result Value Ref Range   Glucose-Capillary 105 (H) 70 - 99 mg/dL    Comment: Glucose reference range applies only to samples taken after fasting for at least 8 hours.   Comment 1 Notify RN   Phosphorus     Status: None   Collection Time: 07/16/24  3:06 AM  Result Value Ref Range   Phosphorus 3.9 2.5 - 4.6 mg/dL    Comment: Performed at [facility], [address] [place], [place] [address]  Glucose, capillary     Status: Abnormal   Collection Time: 07/16/24  7:43 AM  Result Value Ref Range   Glucose-Capillary 110 (H) 70 - 99 mg/dL    Comment: Glucose reference range applies only to samples taken after fasting for at least 8 hours.   No results found.   PMH:   Past Medical History:  Diagnosis Date   Acute on chronic diastolic heart failure (HCC) 03/05/2021   Anemia    Arthritis    C. 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   Stroke [facility])     PSH:   Past Surgical History:  Procedure Laterality Date   A/V FISTULAGRAM Left 08/09/2023   Procedure: A/V Fistulagram;  Surgeon: Sheree Penne Bruckner, MD;  Location: [facility] INVASIVE CV LAB;  Service: Cardiovascular;  Laterality: Left;  A/V FISTULAGRAM Left 03/15/2024   Procedure: A/V Fistulagram;  Surgeon: Pearline Norman RAMAN, MD;  Location: [facility] PV  LAB;  Service: Cardiovascular;  Laterality: Left;   A/V SHUNT INTERVENTION N/A 12/15/2023   Procedure: A/V SHUNT INTERVENTION;  Surgeon: Pearline Norman RAMAN, MD;  Location: [facility] LAB;  Service: Cardiovascular;  Laterality: N/A;   A/V SHUNT INTERVENTION Left 01/21/2024   Procedure: A/V SHUNT INTERVENTION;  Surgeon: Serene Gaile ORN, MD;  Location: [facility];  Service: Cardiovascular;  Laterality: Left;   ABDOMINAL HYSTERECTOMY     AV FISTULA PLACEMENT Left 12/09/2021   Procedure: LEFT ARTERIOVENOUS (AV) FISTULA CREATION;  Surgeon: Sheree Penne Bruckner, MD;  Location: [facility] OR;  Service: Vascular;  Laterality: Left;   BIOPSY  09/08/2021   Procedure: BIOPSY;  Surgeon: Legrand Victory LITTIE DOUGLAS, MD;  Location: [facility] ENDOSCOPY;  Service: Gastroenterology;;   CESAREAN SECTION     x 1   ESOPHAGOGASTRODUODENOSCOPY (EGD) WITH PROPOFOL  N/A 09/08/2021   Procedure: ESOPHAGOGASTRODUODENOSCOPY (EGD) WITH PROPOFOL ;  Surgeon: Legrand Victory LITTIE DOUGLAS, MD;  Location: [facility] ENDOSCOPY;  Service: Gastroenterology;  Laterality: N/A;  chest pain, epigastric pain   FISTULA SUPERFICIALIZATION Left 08/11/2023   Procedure: FISTULA SUPERFICIALIZATION LEFT UPPER ARM;  Surgeon: Magda Debby SAILOR, MD;  Location: [facility] OR;  Service: Vascular;  Laterality: Left;   INNER EAR SURGERY Bilateral    INSERTION OF DIALYSIS CATHETER Right 08/11/2023   Procedure: INSERTION OF DIALYSIS CATHETER;  Surgeon: Magda Debby SAILOR, MD;  Location: [facility] OR;  Service: Vascular;  Laterality: Right;   LAPAROSCOPIC LYSIS OF ADHESIONS N/A 06/02/2024   Procedure: LYSIS, ADHESIONS, LAPAROSCOPIC;  Surgeon: Sheree Penne Bruckner, MD;  Location: [facility] OR;  Service: Vascular;  Laterality: N/A;   RIGHT HEART CATH N/A 05/14/2021   Procedure: RIGHT HEART CATH;  Surgeon: Rolan Ezra RAMAN, MD;  Location: [facility] INVASIVE CV LAB;  Service: Cardiovascular;  Laterality: N/A;   TENCKHOFF CATHETER INSERTION N/A 06/02/2024   Procedure: INSERTION, CATHETER, DIALYSIS, PERITONEAL;  Surgeon: Sheree Penne Bruckner, MD;  Location: [facility] OR;  Service: Vascular;  Laterality: N/A;   TONSILLECTOMY     age 78   VENOUS ANGIOPLASTY  01/21/2024   Procedure: VENOUS ANGIOPLASTY;  Surgeon: Serene Gaile ORN, MD;  Location: [facility];  Service: Cardiovascular;;  Cephalic Vein   VENOUS ANGIOPLASTY Left 03/15/2024   Procedure: VENOUS ANGIOPLASTY;  Surgeon: Pearline Norman RAMAN, MD;  Location: [facility];  Service: Cardiovascular;  Laterality: Left;  Mid-AVF/Cephalic Vein; Cephalic Arch    Allergies: Allergies[1]  Medications:   Prior to Admission medications  Medication Sig Start Date End Date Taking? Authorizing Provider  acetaminophen  (TYLENOL ) 500 MG tablet Take 1,000 mg by mouth every 6 (six) hours as needed for moderate pain or headache.   Yes [provider]  allopurinol  (ZYLOPRIM ) 100 MG tablet Take 100 mg by mouth in the morning. 01/27/21  Yes [provider]  aspirin  EC 81 MG tablet Take 81 mg by mouth in the morning. Swallow whole.   Yes [provider]  atorvastatin  (LIPITOR ) 80 MG tablet TAKE 1 TABLET BY MOUTH EVERY EVENING 07/11/24  Yes Rolan Ezra RAMAN, MD  buPROPion  (WELLBUTRIN ) 100 MG tablet Take 100 mg by mouth 2 (two) times daily. 02/04/21  Yes [provider]  calcitRIOL  (ROCALTROL ) 0.5 MCG capsule Take 0.5 mcg by mouth in the morning.   Yes [provider]  carvedilol  (COREG ) 3.125 MG tablet Take 1 tablet (3.125 mg total) by mouth 2 (two) times daily. Hold prior to Hemodialysis  07/05/24  Yes Milford, Jessica M, FNP  Cholecalciferol (D3) 25 MCG (1000 UT) capsule Take 1,000 Units by mouth daily.   Yes [provider]  diphenhydrAMINE (BENADRYL) 25 MG tablet Take 25 mg by mouth every 6 (six) hours as needed for allergies or itching.   Yes [provider]  ezetimibe  (ZETIA ) 10 MG tablet Take 1 tablet (10 mg total) by mouth daily. 03/09/24 07/05/25 Yes Rolan Ezra RAMAN, MD  fluticasone  (FLONASE ) 50 MCG/ACT nasal spray Place 1 spray into  both nostrils daily as needed for allergies or rhinitis.   Yes [provider]  isosorbide  mononitrate (IMDUR ) 120 MG 24 hr tablet Take 60 mg by mouth at bedtime.   Yes [provider]  loratadine  (CLARITIN ) 10 MG tablet Take 10 mg by mouth in the morning. 02/14/21  Yes [provider]  Multiple Vitamin (MULTIVITAMIN ADULT) TABS Take 1 tablet by mouth daily with lunch.   Yes [provider]  nitroGLYCERIN  (NITROSTAT ) 0.4 MG SL tablet Place 1 tablet (0.4 mg total) under the tongue every 5 (five) minutes as needed for chest pain. 03/07/21  Yes [name], Elidia Sieving, MD  NOVOLOG  FLEXPEN 100 UNIT/ML FlexPen Inject 5 Units into the skin 3 (three) times daily as needed for high blood sugar. 01/12/21  Yes [provider]  Omega-3 Fatty Acids (FISH OIL PO) Take 2,400 mg by mouth daily with lunch.   Yes [provider]  OZEMPIC , 2 MG/DOSE, 8 MG/3ML SOPN Inject 2 mg into the skin every 7 (seven) days. 05/15/24  Yes [provider]  potassium chloride  SA (KLOR-CON  M) 20 MEQ tablet Take 1 tablet (20 mEq total) by mouth daily. 06/23/24 07/05/25 Yes Stovall, Lamarr SAUNDERS, PA-C  pregabalin  (LYRICA ) 75 MG capsule Take 75 mg by mouth 2 (two) times daily. 02/03/21  Yes [provider]  sertraline  (ZOLOFT ) 50 MG tablet Take 50 mg by mouth in the morning. 02/01/21  Yes [provider]  sucroferric oxyhydroxide (VELPHORO ) 500 MG chewable tablet Chew 500 mg by mouth 3 (three) times daily with meals.   Yes [provider]  traMADol  (ULTRAM ) 50 MG tablet Take 1 tablet (50 mg total) by mouth every 6 (six) hours as needed for severe pain (pain score 7-10). 06/02/24 06/02/25 Yes Baglia, Corrina, PA-C  traZODone  (DESYREL ) 50 MG tablet Take 50 mg by mouth at bedtime.   Yes [provider]  TURMERIC CURCUMIN PO Take 2,000 mg by mouth daily with lunch.   Yes [provider]    Discontinued Meds:   Medications Discontinued During This  Encounter  Medication Reason   loperamide  (IMODIUM ) 2 MG capsule Patient Preference   fidaxomicin  (DIFICID ) tablet 200 mg    pentafluoroprop-tetrafluoroeth (GEBAUERS) aerosol 1 Application Patient Transfer   lidocaine  (PF) (XYLOCAINE ) 1 % injection 5 mL Patient Transfer   lidocaine -prilocaine  (EMLA ) cream 1 Application Patient Transfer   heparin  injection 1,000 Units Patient Transfer   anticoagulant sodium citrate  solution 5 mL Patient Transfer   alteplase  (CATHFLO ACTIVASE ) injection 2 mg Patient Transfer    Social History:  reports that she has never smoked. She has never been exposed to tobacco smoke. She has never used smokeless tobacco. She reports that she does not currently use alcohol. She reports that she does not use drugs.  Family History:   Family History  Problem Relation Age of Onset   Hypertension Mother    Stroke Mother    Diabetes Mother    Other Mother  enlarged heart   Hypertension Father    Hypertension Sister    Diabetes Sister    CAD Sister        enlarged heart   Kidney disease Sister    Diabetes Paternal Grandmother    Hypertension Daughter    Diabetes Daughter    Hypertension Son    Diabetes Son    Breast cancer Paternal Aunt     Blood pressure 108/60, pulse 76, temperature (!) 97.5 F (36.4 C), temperature source Oral, resp. rate 18, height 5' 3 (1.6 m), weight 98.8 kg, SpO2 98%. Physical Exam: General:well appearing female in NAD Heart:RRR Lungs:CTAB Abdomen:soft, PD catheter in place Extremities:no LE edema Dialysis Access: LUA BCF + bruit (needle in place), PD catheter in LUQ  GU: no foley Neuro: A&Ox4, pleasant, moving all 4 ext, sensory grossly intact     Merritt Kibby, LYNWOOD ORN, MD 07/16/2024, 9:45 AM      [1]  Allergies Allergen Reactions   Metformin And Related Nausea Only   Other     Clonidine  patch causes skin irritation    Hydrocodone Itching    Patient able to tolerate when taken with Benadryl.   Oxycodone -Acetaminophen   Itching    Patient able to tolerate when taken with Benadryl.

## 2024-07-16 NOTE — Care Management CC44 (Signed)
"         Condition Code 44 Documentation Completed  Patient Details  Name: IVORI STORR MRN: [id number] Date of Birth: 30-May-1961   Condition Code 44 given:  yes  Patient signature on Condition Code 44 notice:  yes  Documentation of 2 MD's agreement:   yes Code 44 added to claim:  yes     Imya Mance G., RN 07/16/2024, 8:17 AM  "

## 2024-07-16 NOTE — Progress Notes (Signed)
 " PROGRESS NOTE    Maria Hudson  FMW:[id number] DOB: 12/13/1960 DOA: 07/14/2024 PCP: Shelda Atlas, MD  Subjective: Family unable to come pick pt up yesterday. No diarrhea this AM. Po vanco is working.   Hospital Course: CC: diarrhea HPI: 64 year old African-American female with a history of end-stage renal disease on hemodialysis Monday, Tuesday, Thursday and Friday.  She is currently transitioning over to peritoneal dialysis.  She was admitted in the early part of January 2026 for C. difficile colitis/diarrhea.  She was discharged on June 25, 2024.  She completed an additional 7 days of Dificid  at home.  Over the last 5 days, she has noticed increasing wateriness of her stools.  She stopped taking Linzess  that she normally is taking for her chronic constipation 5 days ago.  Last night about 2:30 in the morning, she noticed her beginning to have diarrhea every 30 minutes.  She started having some abdominal cramping as well.  She did not have any fevers.  She states that this felt like when she had her C. difficile diarrhea in the early part of the month and came to the ER for evaluation.  She denies any chest pain, shortness of breath, nausea or vomiting.  She did peritoneal dialysis yesterday as she is learning how to do home peritoneal dialysis.  On arrival to the ER temp 90.2 heart rate 76 blood pressure 153/60 satting 100% on room air.  C. difficile antigen and toxin are both positive  White count 7.6, hemoglobin 11.9, platelets of 213  Lipase is 66  Sodium 136, potassium 4.2, chloride of 94, bicarb 24, BUN of 87, creatinine 8.46, calcium  9.7, glucose of 81  Total protein 7.5, albumin 4.0, AST 29, ALT of 11, alk phos 80, total bili 0.3  CT abdomen pelvis without contrast showed equivocal colonic thickening from the cecum to the descending colon to the rectum which may reflect colitis.  Both infectious disease and nephrology have been consulted.  [facility] hospitalist consulted  for admission.  Significant Events: Admitted 07/14/2024 for recurrent C. Diff coliits   Admission Labs: C. difficile antigen and toxin are both positive White count 7.6, hemoglobin 11.9, platelets of 213 Lipase is 66 Sodium 136, potassium 4.2, chloride of 94, bicarb 24, BUN of 87, creatinine 8.46, calcium  9.7, glucose of 81 Total protein 7.5, albumin 4.0, AST 29, ALT of 11, alk phos 80, total bili 0.3  Admission Imaging Studies: CT abd/pelvis Equivocal colonic wall thickening from the cecum and descending colon to the rectum, which may reflect colitis (infectious, inflammatory, or ischemic), with evaluation limited by lack of oral and IV contrast. If symptoms persist or worsen, consider repeat CT abdomen/pelvis with IV contrast (and oral contrast if feasible) or colonoscopy as clinically appropriate.  Significant Labs:   Significant Imaging Studies:   Antibiotic Therapy: Anti-infectives (From admission, onward)    Start     Dose/Rate Route Frequency Ordered Stop   07/14/24 1000  fidaxomicin  (DIFICID ) tablet 200 mg        200 mg Oral 2 times daily 07/14/24 9047 07/24/24 0959       Procedures:   Consultants: Infectious disease nephrology    Assessment and Plan: * Recurrent Clostridioides difficile diarrhea 07/14/24 Admit to med/tele bed. Dificid  restarted. ID consulted.  ??candidate for fecal microbiota transplant??  07/15/24 seen by ID consult yesterday. Placed on prolonged 4 week vancomycin  taper. No systemic illness. No BM during 4 hour HD session today. Pt requesting to go home today. Will arrange for outpatient  ID office visit. Pt to stay off of peritoneal dialysis while she is getting treated for C. Diff.  07/16/24 stable on po vanco taper. Was discharged yesterday. Pt unable to get ride home yesterday.   ESRD on dialysis [facility]) 07/14/24 Nephrology consult. 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  Nephrology to decide. She has LUE fistula for HD. She was on home hemodialysis on M, T, Th, Friday due to her inability to tolerate regular 4 hour 3 days a week sessions.  07/15/24 Pt to stay off of peritoneal dialysis while she is getting treated for C. Diff. Pt to continue home hemodialysis on M, Tu, Thu, Friday. F/U with ID and nephrology before restarting peritoneal dialysis.   Essential hypertension 07/14/24 Continue home HTN meds. With hold parameters.  07/15/24 continue home HTN meds.   Refusal of blood product - pt is Jehovah's witness 07/14/24 Confirmed that pt is a Jehovah's witness and refuses blood transfusions.  Diabetic neuropathy (HCC) 07/14/24 Continue lyrica .  07/15/24 continue lyrica    Chronic diastolic CHF (congestive heart failure) (HCC) 07/14/24 Euvolemic right now. Volume status due to be managed by dialysis and nephrology.  07/15/24 stable.   Obesity, Class II, BMI 35-39.9 Body mass index is 38.97 kg/m.   T2DM (type 2 diabetes mellitus) (HCC) 07/14/24 Hold ozempic . Add SSI. Pt states her appetite has been poor this week.  07/15/24 continue insulin  at home. Rec to hold ozempic  while getting treated for c. Diff.    DVT prophylaxis: heparin  injection 5,000 Units Start: 07/14/24 1400 SCDs Start: 07/14/24 1216    Code Status: Full Code Family Communication: no family at bedside. Spoke with pt's dtr yesterday via phone Disposition Plan: home Reason for continuing need for hospitalization: medically stable for DC.  Objective: Vitals:   07/15/24 1533 07/15/24 2139 07/16/24 0504 07/16/24 0744  BP: 126/66 128/61 (!) 85/51 108/60  Pulse: 74 66 74 76  Resp: 18 18 16 18   Temp: 98.3 F (36.8 C) 97.9 F (36.6 C) 97.8 F (36.6 C) (!) 97.5 F (36.4 C)  TempSrc:  Oral Oral Oral  SpO2: 100% 100% 99% 98%  Weight:      Height:        Intake/Output Summary (Last 24 hours) at 07/16/2024 1024 Last data filed at 07/15/2024 1242 Gross per 24 hour  Intake --   Output 1000 ml  Net -1000 ml   Filed Weights   07/14/24 0651 07/15/24 0827 07/15/24 1242  Weight: 99.8 kg 99.8 kg 98.8 kg    Examination:  Physical Exam Vitals and nursing note reviewed.  Pulmonary:     Effort: Pulmonary effort is normal. No respiratory distress.  Abdominal:     General: There is no distension.  Neurological:     General: No focal deficit present.     Mental Status: She is alert and oriented to person, place, and time.     Data Reviewed: I have personally reviewed following labs and imaging studies  CBC: Recent Labs  Lab 07/14/24 0716 07/15/24 0359  WBC 7.6 5.6  NEUTROABS 6.3 4.3  HGB 11.9* 11.3*  HCT 36.4 34.3*  MCV 81.8 80.0  PLT 213 245   Basic Metabolic Panel: Recent Labs  Lab 07/14/24 0716 07/15/24 0359 07/16/24 0306  NA 136 132*  --   K 4.2 3.8  --   CL 94* 91*  --   CO2 24 23  --   GLUCOSE 81 115*  --  BUN 87* 100*  --   CREATININE 8.46* 9.90*  --   CALCIUM  9.7 9.3  --   PHOS  --   --  3.9   GFR: Estimated Creatinine Clearance: 6.6 mL/min (A) (by C-G formula based on SCr of 9.9 mg/dL (H)). Liver Function Tests: Recent Labs  Lab 07/14/24 0716 07/15/24 0359  AST 29 19  ALT 11 7  ALKPHOS 80 77  BILITOT 0.3 0.3  PROT 7.5 7.2  ALBUMIN 4.0 3.8   Recent Labs  Lab 07/14/24 0716  LIPASE 66*   ProBNP, BNP (last 5 results) Recent Labs    12/18/23 1133  BNP 26.4   CBG: Recent Labs  Lab 07/14/24 1958 07/15/24 0757 07/15/24 1530 07/15/24 2142 07/16/24 0743  GLUCAP 132* 120* 138* 105* 110*    Recent Results (from the past 240 hours)  C Difficile Quick Screen w PCR reflex     Status: Abnormal   Collection Time: 07/14/24  7:16 AM   Specimen: STOOL  Result Value Ref Range Status   C Diff antigen POSITIVE (A) NEGATIVE Final   C Diff toxin POSITIVE (A) NEGATIVE Final   C Diff interpretation Toxin producing C. difficile detected.  Final    Comment: CRITICAL RESULT CALLED TO, READ BACK BY AND VERIFIED WITH: RN NORLEEN AHLE [id number] AT [phone] BY EC Performed at [facility], [address] [place], [place] [address]     Scheduled Meds:  atorvastatin   80 mg Oral QPM   buPROPion   100 mg Oral BID   calcitRIOL   0.5 mcg Oral q AM   carvedilol   3.125 mg Oral BID   Chlorhexidine  Gluconate Cloth  6 each Topical Q0600   ezetimibe   10 mg Oral Daily   heparin   5,000 Units Subcutaneous Q8H   insulin  aspart  0-5 Units Subcutaneous QHS   insulin  aspart  0-6 Units Subcutaneous TID WC   isosorbide  mononitrate  60 mg Oral QHS   pregabalin   75 mg Oral BID   sertraline   50 mg Oral Daily   vancomycin   125 mg Oral QID   Followed by   NOREEN ON 07/28/2024] vancomycin   125 mg Oral BID   Followed by   NOREEN ON 08/05/2024] vancomycin   125 mg Oral Daily   Followed by   NOREEN ON 08/12/2024] vancomycin   125 mg Oral QODAY   Followed by   NOREEN ON 08/20/2024] vancomycin   125 mg Oral Q3 days   Continuous Infusions:   LOS: 1 day   Time spent: 55 minutes  Camellia Door, DO  [facility]  07/16/2024, 10:24 AM  "

## 2024-07-17 NOTE — Progress Notes (Signed)
 Late Note Entry- Jul 17, 2024  Pt was d/c yesterday. Contacted [facility] home therapy dept and left a message for on-call RN requesting a return call to advise staff of pt's d/c.   Randine Mungo Dialysis Navigator [phone]

## 2024-07-18 ENCOUNTER — Telehealth: Payer: Self-pay | Admitting: Nephrology

## 2024-07-18 NOTE — Telephone Encounter (Signed)
 Transition of Care - Initial Contact after Hospitalization  Date of discharge:  07/16/24 Date of contact: 07/18/24  Method: Phone Spoke to: Patient  Patient contacted to discuss transition of care from recent inpatient hospitalization. Patient was admitted to [facility] from 1/30-2/126 for discharge diagnosis of C. Diff diarrhea.   The discharge medication list was reviewed. Has outpatient infectious disease appointment   Patient will return to his/her outpatient HD unit on: She has returned to home dialysis.   No other concerns at this time.

## 2024-07-19 ENCOUNTER — Other Ambulatory Visit: Payer: Self-pay

## 2024-07-19 ENCOUNTER — Encounter: Payer: Self-pay | Admitting: Internal Medicine

## 2024-07-19 ENCOUNTER — Ambulatory Visit: Payer: Self-pay | Admitting: Internal Medicine

## 2024-07-19 NOTE — Progress Notes (Unsigned)
 "     Patient: Maria Hudson  DOB: 09/25/1960 MRN: [id number] PCP: Shelda Atlas, MD  Referring Provider: ***  No chief complaint on file.    Patient Active Problem List   Diagnosis Date Noted   Recurrent Clostridioides difficile diarrhea 07/14/2024   ESRD on dialysis (HCC) 10/12/2023   History of colonic polyps 12/28/2022   Chronic constipation 03/31/2022   Pure hypercholesterolemia 05/27/2021   CAD (coronary artery disease) 03/05/2021   T2DM (type 2 diabetes mellitus) (HCC) 03/05/2021   Obesity, Class II, BMI 35-39.9 03/05/2021   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   Vitreous hemorrhage of left eye (HCC) 11/14/2020   Vision loss of right eye 08/06/2020   Excessive daytime sleepiness 01/11/2020   Vitreous hemorrhage, right (HCC) 12/04/2019   Diabetic neuropathy (HCC) 07/26/2019   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) 02/03/2019   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 @GENDER @ with  Today: pt states she is dong well. Dirrhea resolved. No abdominal pin one bm yesterday.  ROS  Past Medical History:  Diagnosis Date   Acute on chronic diastolic heart failure (HCC) 03/05/2021   Anemia    Arthritis    C. difficile colitis 06/16/2024   CAD (coronary artery disease)    CHF (congestive heart failure) (HCC)    Clostridium difficile  colitis 06/17/2024   Colon polyps    CVA (cerebral vascular accident) (HCC)    Depression    Diabetes mellitus without complication (HCC)    type 2   Difficult intubation    Dyspnea    ESRD on hemodialysis (HCC)    Heart murmur    Hypertensive urgency 04/14/2021   Memory loss    mild   Myocardial infarction (HCC)    OSA (obstructive sleep apnea) 05/27/2021   uses CPAP 12-18 cm   Pneumonia    several times   Pure hypercholesterolemia 05/27/2021   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   Stroke [facility])     Outpatient Medications Prior to Visit  Medication Sig Dispense Refill   acetaminophen  (TYLENOL ) 500 MG tablet Take 1,000 mg by mouth every 6 (six) hours as needed for moderate pain or headache.     allopurinol  (ZYLOPRIM ) 100 MG tablet Take 100 mg by mouth in the morning.     aspirin  EC 81 MG tablet Take 81 mg by mouth in the morning. Swallow whole.     atorvastatin  (LIPITOR ) 80 MG tablet TAKE 1 TABLET BY MOUTH EVERY EVENING 90 tablet 1   buPROPion  (WELLBUTRIN ) 100 MG tablet Take 100 mg by mouth 2 (two) times daily.     calcitRIOL  (ROCALTROL ) 0.5 MCG capsule Take 0.5 mcg by mouth in the morning.     carvedilol  (COREG ) 3.125 MG tablet Take 1 tablet (3.125 mg total) by mouth 2 (two) times daily. Hold prior to Hemodialysis 60 tablet 11   Cholecalciferol (D3) 25 MCG (1000 UT) capsule Take  1,000 Units by mouth daily.     diphenhydrAMINE (BENADRYL) 25 MG tablet Take 25 mg by mouth every 6 (six) hours as needed for allergies or itching.     ezetimibe  (ZETIA ) 10 MG tablet Take 1 tablet (10 mg total) by mouth daily. 90 tablet 3   fluticasone  (FLONASE ) 50 MCG/ACT nasal spray Place 1 spray into both nostrils daily as needed for allergies or rhinitis.     isosorbide  mononitrate (IMDUR ) 120 MG 24 hr tablet Take 60 mg by mouth at bedtime.     loratadine  (CLARITIN ) 10 MG tablet Take 10 mg by mouth in the morning.     Multiple Vitamin (MULTIVITAMIN ADULT) TABS Take 1 tablet by mouth daily with lunch.      nitroGLYCERIN  (NITROSTAT ) 0.4 MG SL tablet Place 1 tablet (0.4 mg total) under the tongue every 5 (five) minutes as needed for chest pain. 20 tablet 0   NOVOLOG  FLEXPEN 100 UNIT/ML FlexPen Inject 5 Units into the skin 3 (three) times daily as needed for high blood sugar.     Omega-3 Fatty Acids (FISH OIL PO) Take 2,400 mg by mouth daily with lunch.     OZEMPIC , 2 MG/DOSE, 8 MG/3ML SOPN Inject 2 mg into the skin every 7 (seven) days.     potassium chloride  SA (KLOR-CON  M) 20 MEQ tablet Take 1 tablet (20 mEq total) by mouth daily. 7 tablet 0   pregabalin  (LYRICA ) 75 MG capsule Take 75 mg by mouth 2 (two) times daily.     sertraline  (ZOLOFT ) 50 MG tablet Take 50 mg by mouth in the morning.     sucroferric oxyhydroxide (VELPHORO ) 500 MG chewable tablet Chew 500 mg by mouth 3 (three) times daily with meals.     traMADol  (ULTRAM ) 50 MG tablet Take 1 tablet (50 mg total) by mouth every 6 (six) hours as needed for severe pain (pain score 7-10). 20 tablet 0   traZODone  (DESYREL ) 50 MG tablet Take 50 mg by mouth at bedtime.     TURMERIC CURCUMIN PO Take 2,000 mg by mouth daily with lunch.     vancomycin  (VANCOCIN ) 125 MG capsule Take 1 capsule (125 mg total) by mouth 4 (four) times daily for 10 days, THEN 1 capsule (125 mg total) 2 (two) times daily for 7 days, THEN 1 capsule (125 mg total) daily for 7 days, THEN 1 capsule (125 mg total) every other day for 7 days, THEN 1 capsule (125 mg total) every 3 (three) days for 14 days. 70 capsule 0   Facility-Administered Medications Prior to Visit  Medication Dose Route Frequency Provider Last Rate Last Admin   technetium pyrophosphate Tc 36m injection 21.2 millicurie  21.2 millicurie Intravenous Once Hilty, Vinie BROCKS, MD         Allergies[1]  Social History[2]  Family History  Problem Relation Age of Onset   Hypertension Mother    Stroke Mother    Diabetes Mother    Other Mother        enlarged heart   Hypertension Father    Hypertension Sister     Diabetes Sister    CAD Sister        enlarged heart   Kidney disease Sister    Diabetes Paternal Grandmother    Hypertension Daughter    Diabetes Daughter    Hypertension Son    Diabetes Son    Breast cancer Paternal Aunt     Objective:   Vitals:   07/19/24 1355  Weight: 214 lb (97.1  kg)  Height: 5' 3 (1.6 m)   Body mass index is 37.91 kg/m.  Physical Exam  Lab Results: Lab Results  Component Value Date   WBC 5.6 07/15/2024   HGB 11.3 (L) 07/15/2024   HCT 34.3 (L) 07/15/2024   MCV 80.0 07/15/2024   PLT 245 07/15/2024    Lab Results  Component Value Date   CREATININE 9.90 (H) 07/15/2024   BUN 100 (H) 07/15/2024   NA 132 (L) 07/15/2024   K 3.8 07/15/2024   CL 91 (L) 07/15/2024   CO2 23 07/15/2024    Lab Results  Component Value Date   ALT 7 07/15/2024   AST 19 07/15/2024   ALKPHOS 77 07/15/2024   BILITOT 0.3 07/15/2024     Assessment & Plan:  # Recurrent C diff diarrhea, colitis, nonsevere - First episode early July  treated with course of fidaxomicin  with resolution of symptoms - No signs of ileus megacolon or shock  125 mg orally 4 times daily for 10 days, then 125Vnacomycin  mg orally twice daily for 7 days, then 125 mg orally once daily for 7 days, then 125 mg orally every 2 to 3 days for 2  weeks eot 08/28/24 Noted she would benefit from traditional FMT/FMP ( rebyota or vowst) transplantation if further recurrence of C. difficile diarrhea and I discussed to follow up with GI preferably at tertiary center.  Plan -f/u wi t GI as planned -complete corase aboove # ESRD on HD via Left AVF   Loney Stank, MD [facility]   07/19/24  1:55 PM     [1]  Allergies Allergen Reactions   Metformin And Related Nausea Only   Other     Clonidine  patch causes skin irritation    Hydrocodone Itching    Patient able to tolerate when taken with Benadryl.   Oxycodone -Acetaminophen  Itching    Patient  able to tolerate when taken with Benadryl.  [2]  Social History Tobacco Use   Smoking status: Never    Passive exposure: Never   Smokeless tobacco: Never  Vaping Use   Vaping status: Never Used  Substance Use Topics   Alcohol use: Not Currently   Drug use: Never   "

## 2024-07-19 NOTE — Patient Instructions (Addendum)
 IF c diff re-ocurrs, consider f/u with GI at tertiary care center  Complete course of and long taper of vancomycin  F/U with ID prn
